# Patient Record
Sex: Female | Born: 1950
Health system: Southern US, Community
[De-identification: ages and names within clinical notes are randomized; demographics above are authoritative.]

## PROBLEM LIST (undated history)

## (undated) DIAGNOSIS — G709 Myoneural disorder, unspecified: Secondary | ICD-10-CM

## (undated) DIAGNOSIS — Z923 Personal history of irradiation: Secondary | ICD-10-CM

## (undated) DIAGNOSIS — F329 Major depressive disorder, single episode, unspecified: Secondary | ICD-10-CM

## (undated) DIAGNOSIS — M1990 Unspecified osteoarthritis, unspecified site: Secondary | ICD-10-CM

## (undated) DIAGNOSIS — I341 Nonrheumatic mitral (valve) prolapse: Secondary | ICD-10-CM

## (undated) DIAGNOSIS — F32A Depression, unspecified: Secondary | ICD-10-CM

## (undated) DIAGNOSIS — N840 Polyp of corpus uteri: Secondary | ICD-10-CM

## (undated) DIAGNOSIS — C569 Malignant neoplasm of unspecified ovary: Secondary | ICD-10-CM

## (undated) DIAGNOSIS — R188 Other ascites: Secondary | ICD-10-CM

## (undated) DIAGNOSIS — M069 Rheumatoid arthritis, unspecified: Secondary | ICD-10-CM

## (undated) DIAGNOSIS — Z87442 Personal history of urinary calculi: Secondary | ICD-10-CM

## (undated) DIAGNOSIS — N200 Calculus of kidney: Secondary | ICD-10-CM

## (undated) DIAGNOSIS — F419 Anxiety disorder, unspecified: Secondary | ICD-10-CM

## (undated) DIAGNOSIS — R600 Localized edema: Secondary | ICD-10-CM

## (undated) DIAGNOSIS — R079 Chest pain, unspecified: Secondary | ICD-10-CM

## (undated) DIAGNOSIS — Z8052 Family history of malignant neoplasm of bladder: Secondary | ICD-10-CM

## (undated) DIAGNOSIS — E7800 Pure hypercholesterolemia, unspecified: Secondary | ICD-10-CM

## (undated) DIAGNOSIS — I82401 Acute embolism and thrombosis of unspecified deep veins of right lower extremity: Secondary | ICD-10-CM

## (undated) DIAGNOSIS — Z8719 Personal history of other diseases of the digestive system: Secondary | ICD-10-CM

## (undated) DIAGNOSIS — B029 Zoster without complications: Secondary | ICD-10-CM

## (undated) DIAGNOSIS — Z807 Family history of other malignant neoplasms of lymphoid, hematopoietic and related tissues: Secondary | ICD-10-CM

## (undated) HISTORY — DX: Depression, unspecified: F32.A

## (undated) HISTORY — DX: Pure hypercholesterolemia, unspecified: E78.00

## (undated) HISTORY — DX: Anxiety disorder, unspecified: F41.9

## (undated) HISTORY — DX: Family history of other malignant neoplasms of lymphoid, hematopoietic and related tissues: Z80.7

## (undated) HISTORY — DX: Zoster without complications: B02.9

## (undated) HISTORY — DX: Unspecified osteoarthritis, unspecified site: M19.90

## (undated) HISTORY — PX: KIDNEY STONE SURGERY: SHX686

## (undated) HISTORY — DX: Nonrheumatic mitral (valve) prolapse: I34.1

## (undated) HISTORY — DX: Major depressive disorder, single episode, unspecified: F32.9

## (undated) HISTORY — DX: Polyp of corpus uteri: N84.0

## (undated) HISTORY — DX: Malignant neoplasm of unspecified ovary: C56.9

## (undated) HISTORY — DX: Calculus of kidney: N20.0

## (undated) HISTORY — DX: Rheumatoid arthritis, unspecified: M06.9

## (undated) HISTORY — DX: Personal history of other diseases of the digestive system: Z87.19

## (undated) HISTORY — DX: Family history of malignant neoplasm of bladder: Z80.52

## (undated) HISTORY — DX: Personal history of irradiation: Z92.3

---

## 1978-10-04 HISTORY — PX: TUBAL LIGATION: SHX77

## 1994-10-04 HISTORY — PX: HAND SURGERY: SHX662

## 1999-05-08 ENCOUNTER — Encounter: Payer: Self-pay | Admitting: Neurosurgery

## 1999-05-08 ENCOUNTER — Ambulatory Visit (HOSPITAL_COMMUNITY): Admission: RE | Admit: 1999-05-08 | Discharge: 1999-05-08 | Payer: Self-pay | Admitting: Neurosurgery

## 1999-05-11 ENCOUNTER — Encounter: Payer: Self-pay | Admitting: Neurosurgery

## 1999-09-14 ENCOUNTER — Ambulatory Visit (HOSPITAL_COMMUNITY): Admission: RE | Admit: 1999-09-14 | Discharge: 1999-09-14 | Payer: Self-pay | Admitting: Orthopedic Surgery

## 1999-09-14 ENCOUNTER — Encounter: Payer: Self-pay | Admitting: Orthopedic Surgery

## 1999-10-23 ENCOUNTER — Encounter: Payer: Self-pay | Admitting: Obstetrics and Gynecology

## 1999-10-23 ENCOUNTER — Encounter: Admission: RE | Admit: 1999-10-23 | Discharge: 1999-10-23 | Payer: Self-pay | Admitting: Obstetrics and Gynecology

## 2000-03-07 ENCOUNTER — Other Ambulatory Visit: Admission: RE | Admit: 2000-03-07 | Discharge: 2000-03-07 | Payer: Self-pay | Admitting: Obstetrics and Gynecology

## 2000-03-07 ENCOUNTER — Encounter (INDEPENDENT_AMBULATORY_CARE_PROVIDER_SITE_OTHER): Payer: Self-pay | Admitting: Specialist

## 2000-07-02 ENCOUNTER — Encounter: Admission: RE | Admit: 2000-07-02 | Discharge: 2000-07-02 | Payer: Self-pay | Admitting: Pulmonary Disease

## 2000-07-02 ENCOUNTER — Encounter: Payer: Self-pay | Admitting: Pulmonary Disease

## 2001-07-10 ENCOUNTER — Ambulatory Visit (HOSPITAL_COMMUNITY): Admission: RE | Admit: 2001-07-10 | Discharge: 2001-07-10 | Payer: Self-pay | Admitting: Pulmonary Disease

## 2001-07-10 ENCOUNTER — Encounter: Payer: Self-pay | Admitting: Pulmonary Disease

## 2001-10-23 ENCOUNTER — Encounter: Payer: Self-pay | Admitting: Obstetrics and Gynecology

## 2001-10-23 ENCOUNTER — Encounter: Admission: RE | Admit: 2001-10-23 | Discharge: 2001-10-23 | Payer: Self-pay | Admitting: Obstetrics and Gynecology

## 2002-10-04 HISTORY — PX: HYSTEROSCOPY: SHX211

## 2002-10-04 HISTORY — PX: DILATION AND CURETTAGE OF UTERUS: SHX78

## 2002-11-22 ENCOUNTER — Encounter: Admission: RE | Admit: 2002-11-22 | Discharge: 2002-11-22 | Payer: Self-pay | Admitting: Obstetrics and Gynecology

## 2002-11-22 ENCOUNTER — Encounter: Payer: Self-pay | Admitting: Obstetrics and Gynecology

## 2003-01-29 ENCOUNTER — Other Ambulatory Visit: Admission: RE | Admit: 2003-01-29 | Discharge: 2003-01-29 | Payer: Self-pay | Admitting: Obstetrics and Gynecology

## 2003-05-09 ENCOUNTER — Ambulatory Visit (HOSPITAL_BASED_OUTPATIENT_CLINIC_OR_DEPARTMENT_OTHER): Admission: RE | Admit: 2003-05-09 | Discharge: 2003-05-09 | Payer: Self-pay | Admitting: Obstetrics and Gynecology

## 2003-05-09 ENCOUNTER — Encounter (INDEPENDENT_AMBULATORY_CARE_PROVIDER_SITE_OTHER): Payer: Self-pay | Admitting: Specialist

## 2003-12-03 ENCOUNTER — Ambulatory Visit (HOSPITAL_COMMUNITY): Admission: RE | Admit: 2003-12-03 | Discharge: 2003-12-03 | Payer: Self-pay | Admitting: Obstetrics and Gynecology

## 2004-02-03 ENCOUNTER — Other Ambulatory Visit: Admission: RE | Admit: 2004-02-03 | Discharge: 2004-02-03 | Payer: Self-pay | Admitting: Obstetrics and Gynecology

## 2004-04-20 HISTORY — PX: COLONOSCOPY: SHX174

## 2004-09-04 ENCOUNTER — Ambulatory Visit: Payer: Self-pay | Admitting: Cardiovascular Disease

## 2004-09-21 ENCOUNTER — Ambulatory Visit: Payer: Self-pay

## 2004-09-24 ENCOUNTER — Ambulatory Visit: Payer: Self-pay | Admitting: Pulmonary Disease

## 2004-10-20 ENCOUNTER — Ambulatory Visit: Payer: Self-pay | Admitting: Pulmonary Disease

## 2004-12-14 ENCOUNTER — Ambulatory Visit: Payer: Self-pay | Admitting: Pulmonary Disease

## 2005-02-05 ENCOUNTER — Other Ambulatory Visit: Admission: RE | Admit: 2005-02-05 | Discharge: 2005-02-05 | Payer: Self-pay | Admitting: Addiction Medicine

## 2005-02-10 ENCOUNTER — Ambulatory Visit (HOSPITAL_COMMUNITY): Admission: RE | Admit: 2005-02-10 | Discharge: 2005-02-10 | Payer: Self-pay | Admitting: Obstetrics and Gynecology

## 2005-02-24 ENCOUNTER — Ambulatory Visit: Payer: Self-pay | Admitting: Pulmonary Disease

## 2005-06-22 ENCOUNTER — Ambulatory Visit: Payer: Self-pay | Admitting: Pulmonary Disease

## 2005-06-24 ENCOUNTER — Ambulatory Visit (HOSPITAL_COMMUNITY): Admission: RE | Admit: 2005-06-24 | Discharge: 2005-06-24 | Payer: Self-pay | Admitting: Pulmonary Disease

## 2005-07-19 ENCOUNTER — Inpatient Hospital Stay (HOSPITAL_COMMUNITY): Admission: RE | Admit: 2005-07-19 | Discharge: 2005-07-20 | Payer: Self-pay | Admitting: Neurosurgery

## 2005-08-23 ENCOUNTER — Ambulatory Visit: Payer: Self-pay | Admitting: Pulmonary Disease

## 2006-02-07 ENCOUNTER — Other Ambulatory Visit: Admission: RE | Admit: 2006-02-07 | Discharge: 2006-02-07 | Payer: Self-pay | Admitting: Obstetrics and Gynecology

## 2006-02-17 ENCOUNTER — Ambulatory Visit (HOSPITAL_COMMUNITY): Admission: RE | Admit: 2006-02-17 | Discharge: 2006-02-17 | Payer: Self-pay | Admitting: Obstetrics and Gynecology

## 2006-02-25 ENCOUNTER — Ambulatory Visit: Payer: Self-pay | Admitting: Pulmonary Disease

## 2006-03-07 ENCOUNTER — Ambulatory Visit: Payer: Self-pay | Admitting: Cardiology

## 2006-04-07 ENCOUNTER — Ambulatory Visit: Payer: Self-pay | Admitting: Internal Medicine

## 2006-04-14 ENCOUNTER — Ambulatory Visit: Payer: Self-pay | Admitting: Cardiology

## 2006-04-25 ENCOUNTER — Ambulatory Visit (HOSPITAL_COMMUNITY): Admission: RE | Admit: 2006-04-25 | Discharge: 2006-04-25 | Payer: Self-pay | Admitting: Neurosurgery

## 2006-08-15 ENCOUNTER — Ambulatory Visit: Payer: Self-pay | Admitting: Cardiovascular Disease

## 2006-08-22 ENCOUNTER — Ambulatory Visit: Payer: Self-pay

## 2006-08-31 ENCOUNTER — Ambulatory Visit: Payer: Self-pay | Admitting: Pulmonary Disease

## 2006-08-31 LAB — CONVERTED CEMR LAB
Bilirubin Urine: NEGATIVE
Crystals: NEGATIVE
Urine Glucose: NEGATIVE mg/dL
pH: 6 (ref 5.0–8.0)

## 2006-10-04 HISTORY — PX: BACK SURGERY: SHX140

## 2006-11-17 ENCOUNTER — Ambulatory Visit: Payer: Self-pay | Admitting: Internal Medicine

## 2006-12-26 ENCOUNTER — Ambulatory Visit: Payer: Self-pay | Admitting: Pulmonary Disease

## 2007-01-03 ENCOUNTER — Ambulatory Visit: Payer: Self-pay

## 2007-01-03 LAB — CONVERTED CEMR LAB
ALT: 35 units/L (ref 0–40)
AST: 23 units/L (ref 0–37)
Alkaline Phosphatase: 41 units/L (ref 39–117)
HDL: 51.6 mg/dL (ref >39.0)
Total Bilirubin: 1 mg/dL (ref 0.3–1.2)
VLDL: 17 mg/dL (ref 0–40)

## 2007-01-05 ENCOUNTER — Ambulatory Visit: Payer: Self-pay | Admitting: Cardiology

## 2007-01-19 ENCOUNTER — Ambulatory Visit: Payer: Self-pay | Admitting: Pulmonary Disease

## 2007-01-19 LAB — CONVERTED CEMR LAB
ALT: 34 units/L (ref 0–40)
Albumin: 3.9 g/dL (ref 3.5–5.2)
Alkaline Phosphatase: 51 units/L (ref 39–117)
BUN: 17 mg/dL (ref 6–23)
Basophils Relative: 0.8 % (ref 0.0–1.0)
Bilirubin Urine: NEGATIVE
CO2: 31 meq/L (ref 19–32)
Creatinine, Ser: 0.8 mg/dL (ref 0.4–1.2)
Eosinophils Absolute: 0.1 10*3/uL (ref 0.0–0.6)
Eosinophils Relative: 0.8 % (ref 0.0–5.0)
Glucose, Bld: 88 mg/dL (ref 70–99)
Lymphocytes Relative: 17.5 % (ref 12.0–46.0)
Monocytes Absolute: 0.5 10*3/uL (ref 0.2–0.7)
Neutro Abs: 5.4 10*3/uL (ref 1.4–7.7)
Neutrophils Relative %: 73.5 % (ref 43.0–77.0)
Nitrite: NEGATIVE
Platelets: 248 10*3/uL (ref 150–400)
RBC: 4.26 M/uL (ref 3.87–5.11)
RDW: 12.3 % (ref 11.5–14.6)
Sodium: 140 meq/L (ref 135–145)
TSH: 0.9 microintl units/mL (ref 0.35–5.50)
Urine Glucose: NEGATIVE mg/dL
WBC: 7.4 10*3/uL (ref 4.5–10.5)

## 2007-02-21 ENCOUNTER — Other Ambulatory Visit: Admission: RE | Admit: 2007-02-21 | Discharge: 2007-02-21 | Payer: Self-pay | Admitting: Obstetrics and Gynecology

## 2007-03-01 ENCOUNTER — Ambulatory Visit: Payer: Self-pay | Admitting: Pulmonary Disease

## 2007-03-08 ENCOUNTER — Ambulatory Visit (HOSPITAL_COMMUNITY): Admission: RE | Admit: 2007-03-08 | Discharge: 2007-03-08 | Payer: Self-pay | Admitting: Pulmonary Disease

## 2007-03-15 ENCOUNTER — Ambulatory Visit: Payer: Self-pay | Admitting: Pulmonary Disease

## 2007-03-15 LAB — CONVERTED CEMR LAB
Fecal Occult Blood: NEGATIVE
OCCULT 2: NEGATIVE
OCCULT 4: NEGATIVE
OCCULT 5: NEGATIVE

## 2007-03-21 ENCOUNTER — Ambulatory Visit (HOSPITAL_COMMUNITY): Admission: RE | Admit: 2007-03-21 | Discharge: 2007-03-21 | Payer: Self-pay | Admitting: Obstetrics and Gynecology

## 2007-06-02 ENCOUNTER — Ambulatory Visit: Payer: Self-pay | Admitting: Internal Medicine

## 2007-06-02 LAB — CONVERTED CEMR LAB
AST: 22 units/L (ref 0–37)
Albumin: 3.6 g/dL (ref 3.5–5.2)
Alkaline Phosphatase: 40 units/L (ref 39–117)
Cholesterol: 224 mg/dL (ref 0–200)
HDL: 53.5 mg/dL (ref >39.0)
Total CHOL/HDL Ratio: 4.2

## 2007-06-08 ENCOUNTER — Ambulatory Visit: Payer: Self-pay | Admitting: Cardiology

## 2007-10-05 HISTORY — PX: NECK SURGERY: SHX720

## 2007-10-18 ENCOUNTER — Encounter: Admission: RE | Admit: 2007-10-18 | Discharge: 2007-10-18 | Payer: Self-pay | Admitting: Neurosurgery

## 2007-10-26 ENCOUNTER — Ambulatory Visit: Payer: Self-pay | Admitting: Cardiology

## 2008-02-13 ENCOUNTER — Encounter: Payer: Self-pay | Admitting: Pulmonary Disease

## 2008-03-12 ENCOUNTER — Encounter: Payer: Self-pay | Admitting: Pulmonary Disease

## 2008-04-16 ENCOUNTER — Encounter: Payer: Self-pay | Admitting: Pulmonary Disease

## 2008-05-02 ENCOUNTER — Encounter: Admission: RE | Admit: 2008-05-02 | Discharge: 2008-05-02 | Payer: Self-pay | Admitting: Neurosurgery

## 2008-05-15 DIAGNOSIS — Z8679 Personal history of other diseases of the circulatory system: Secondary | ICD-10-CM | POA: Insufficient documentation

## 2008-05-15 DIAGNOSIS — Z8719 Personal history of other diseases of the digestive system: Secondary | ICD-10-CM

## 2008-05-15 DIAGNOSIS — F32A Depression, unspecified: Secondary | ICD-10-CM | POA: Insufficient documentation

## 2008-05-15 DIAGNOSIS — E7800 Pure hypercholesterolemia, unspecified: Secondary | ICD-10-CM | POA: Insufficient documentation

## 2008-05-15 DIAGNOSIS — Z9189 Other specified personal risk factors, not elsewhere classified: Secondary | ICD-10-CM | POA: Insufficient documentation

## 2008-05-15 DIAGNOSIS — F329 Major depressive disorder, single episode, unspecified: Secondary | ICD-10-CM

## 2008-05-15 DIAGNOSIS — F419 Anxiety disorder, unspecified: Secondary | ICD-10-CM | POA: Insufficient documentation

## 2008-05-15 DIAGNOSIS — M1990 Unspecified osteoarthritis, unspecified site: Secondary | ICD-10-CM | POA: Insufficient documentation

## 2008-05-15 HISTORY — DX: Personal history of other diseases of the digestive system: Z87.19

## 2008-05-16 ENCOUNTER — Ambulatory Visit: Payer: Self-pay | Admitting: Pulmonary Disease

## 2008-05-16 DIAGNOSIS — M549 Dorsalgia, unspecified: Secondary | ICD-10-CM | POA: Insufficient documentation

## 2008-05-16 DIAGNOSIS — M542 Cervicalgia: Secondary | ICD-10-CM | POA: Insufficient documentation

## 2008-05-19 LAB — CONVERTED CEMR LAB: Vit D, 1,25-Dihydroxy: 14 — ABNORMAL LOW (ref 30–89)

## 2008-05-20 ENCOUNTER — Encounter: Payer: Self-pay | Admitting: Pulmonary Disease

## 2008-05-28 ENCOUNTER — Ambulatory Visit (HOSPITAL_COMMUNITY): Admission: RE | Admit: 2008-05-28 | Discharge: 2008-05-28 | Payer: Self-pay | Admitting: Neurosurgery

## 2008-06-21 ENCOUNTER — Other Ambulatory Visit: Admission: RE | Admit: 2008-06-21 | Discharge: 2008-06-21 | Payer: Self-pay | Admitting: Obstetrics and Gynecology

## 2008-06-21 ENCOUNTER — Encounter: Payer: Self-pay | Admitting: Obstetrics and Gynecology

## 2008-06-21 ENCOUNTER — Ambulatory Visit: Payer: Self-pay | Admitting: Obstetrics and Gynecology

## 2008-06-26 ENCOUNTER — Ambulatory Visit (HOSPITAL_COMMUNITY): Admission: RE | Admit: 2008-06-26 | Discharge: 2008-06-26 | Payer: Self-pay | Admitting: Obstetrics and Gynecology

## 2008-07-01 ENCOUNTER — Encounter: Payer: Self-pay | Admitting: Pulmonary Disease

## 2008-07-04 ENCOUNTER — Ambulatory Visit: Payer: Self-pay | Admitting: Obstetrics and Gynecology

## 2008-07-25 ENCOUNTER — Encounter: Admission: RE | Admit: 2008-07-25 | Discharge: 2008-07-25 | Payer: Self-pay | Admitting: *Deleted

## 2008-09-19 ENCOUNTER — Encounter: Payer: Self-pay | Admitting: Pulmonary Disease

## 2008-09-24 ENCOUNTER — Ambulatory Visit: Payer: Self-pay | Admitting: Pulmonary Disease

## 2008-09-25 DIAGNOSIS — E559 Vitamin D deficiency, unspecified: Secondary | ICD-10-CM | POA: Insufficient documentation

## 2008-09-30 ENCOUNTER — Encounter: Payer: Self-pay | Admitting: Pulmonary Disease

## 2008-10-08 ENCOUNTER — Encounter: Payer: Self-pay | Admitting: Pulmonary Disease

## 2008-10-14 ENCOUNTER — Encounter: Admission: RE | Admit: 2008-10-14 | Discharge: 2009-01-12 | Payer: Self-pay | Admitting: Neurosurgery

## 2009-01-17 LAB — CONVERTED CEMR LAB
Nitrite: NEGATIVE
RBC / HPF: NONE SEEN

## 2009-06-13 ENCOUNTER — Ambulatory Visit: Payer: Self-pay | Admitting: Pulmonary Disease

## 2009-06-17 LAB — CONVERTED CEMR LAB: Vit D, 25-Hydroxy: 35 ng/mL (ref 30–89)

## 2009-06-23 ENCOUNTER — Other Ambulatory Visit: Admission: RE | Admit: 2009-06-23 | Discharge: 2009-06-23 | Payer: Self-pay | Admitting: Obstetrics and Gynecology

## 2009-06-23 ENCOUNTER — Ambulatory Visit: Payer: Self-pay | Admitting: Obstetrics and Gynecology

## 2009-06-23 ENCOUNTER — Encounter: Payer: Self-pay | Admitting: Obstetrics and Gynecology

## 2009-06-26 ENCOUNTER — Ambulatory Visit: Payer: Self-pay | Admitting: Obstetrics and Gynecology

## 2009-07-01 ENCOUNTER — Ambulatory Visit (HOSPITAL_COMMUNITY): Admission: RE | Admit: 2009-07-01 | Discharge: 2009-07-01 | Payer: Self-pay | Admitting: Obstetrics and Gynecology

## 2009-07-02 ENCOUNTER — Ambulatory Visit: Payer: Self-pay | Admitting: Obstetrics and Gynecology

## 2009-07-21 ENCOUNTER — Ambulatory Visit: Payer: Self-pay | Admitting: Obstetrics and Gynecology

## 2009-10-04 HISTORY — PX: OTHER SURGICAL HISTORY: SHX169

## 2009-10-07 ENCOUNTER — Telehealth (INDEPENDENT_AMBULATORY_CARE_PROVIDER_SITE_OTHER): Payer: Self-pay | Admitting: *Deleted

## 2009-11-10 ENCOUNTER — Ambulatory Visit: Payer: Self-pay | Admitting: Obstetrics and Gynecology

## 2009-11-21 ENCOUNTER — Ambulatory Visit: Payer: Self-pay | Admitting: Obstetrics and Gynecology

## 2009-12-12 ENCOUNTER — Ambulatory Visit: Payer: Self-pay | Admitting: Obstetrics and Gynecology

## 2010-02-23 ENCOUNTER — Telehealth: Payer: Self-pay | Admitting: Pulmonary Disease

## 2010-02-25 ENCOUNTER — Ambulatory Visit: Payer: Self-pay | Admitting: Pulmonary Disease

## 2010-03-05 ENCOUNTER — Telehealth (INDEPENDENT_AMBULATORY_CARE_PROVIDER_SITE_OTHER): Payer: Self-pay | Admitting: *Deleted

## 2010-03-06 LAB — CONVERTED CEMR LAB
Bilirubin, Direct: 0.1 mg/dL (ref 0.0–0.3)
Direct LDL: 123.6 mg/dL
HDL: 55.3 mg/dL (ref >39.00)
Total CHOL/HDL Ratio: 4
Total Protein: 7 g/dL (ref 6.0–8.3)
VLDL: 30.4 mg/dL (ref 0.0–40.0)

## 2010-03-30 ENCOUNTER — Telehealth (INDEPENDENT_AMBULATORY_CARE_PROVIDER_SITE_OTHER): Payer: Self-pay | Admitting: *Deleted

## 2010-04-02 ENCOUNTER — Telehealth (INDEPENDENT_AMBULATORY_CARE_PROVIDER_SITE_OTHER): Payer: Self-pay | Admitting: *Deleted

## 2010-04-07 ENCOUNTER — Encounter: Payer: Self-pay | Admitting: Pulmonary Disease

## 2010-04-16 ENCOUNTER — Ambulatory Visit: Payer: Self-pay | Admitting: Pulmonary Disease

## 2010-04-16 DIAGNOSIS — L723 Sebaceous cyst: Secondary | ICD-10-CM | POA: Insufficient documentation

## 2010-06-23 ENCOUNTER — Encounter: Payer: Self-pay | Admitting: Pulmonary Disease

## 2010-06-23 ENCOUNTER — Ambulatory Visit: Payer: Self-pay | Admitting: Pulmonary Disease

## 2010-06-24 ENCOUNTER — Other Ambulatory Visit: Admission: RE | Admit: 2010-06-24 | Discharge: 2010-06-24 | Payer: Self-pay | Admitting: Obstetrics and Gynecology

## 2010-06-24 ENCOUNTER — Ambulatory Visit: Payer: Self-pay | Admitting: Obstetrics and Gynecology

## 2010-06-27 LAB — CONVERTED CEMR LAB: Vit D, 25-Hydroxy: 39 ng/mL (ref 30–89)

## 2010-06-29 LAB — CONVERTED CEMR LAB
ALT: 28 units/L (ref 0–35)
AST: 22 units/L (ref 0–37)
Albumin: 4.1 g/dL (ref 3.5–5.2)
BUN: 23 mg/dL (ref 6–23)
Basophils Relative: 0.4 % (ref 0.0–3.0)
CO2: 30 meq/L (ref 19–32)
Chloride: 102 meq/L (ref 96–112)
Cholesterol: 334 mg/dL — ABNORMAL HIGH (ref 0–200)
Creatinine, Ser: 0.7 mg/dL (ref 0.4–1.2)
Eosinophils Relative: 0.6 % (ref 0.0–5.0)
Glucose, Bld: 82 mg/dL (ref 70–99)
HDL: 60 mg/dL (ref >39.00)
Lymphocytes Relative: 18 % (ref 12.0–46.0)
Monocytes Absolute: 0.6 10*3/uL (ref 0.1–1.0)
Neutrophils Relative %: 71.5 % (ref 43.0–77.0)
Platelets: 259 10*3/uL (ref 150.0–400.0)
Potassium: 4 meq/L (ref 3.5–5.1)
RBC: 4.33 M/uL (ref 3.87–5.11)
TSH: 1.77 microintl units/mL (ref 0.35–5.50)
Total CHOL/HDL Ratio: 6
Total Protein: 6.7 g/dL (ref 6.0–8.3)
Triglycerides: 170 mg/dL — ABNORMAL HIGH (ref 0.0–149.0)
WBC: 6.3 10*3/uL (ref 4.5–10.5)

## 2010-07-02 ENCOUNTER — Encounter: Admission: RE | Admit: 2010-07-02 | Discharge: 2010-07-02 | Payer: Self-pay | Admitting: Obstetrics and Gynecology

## 2010-07-06 ENCOUNTER — Ambulatory Visit: Payer: Self-pay | Admitting: Internal Medicine

## 2010-07-06 LAB — CONVERTED CEMR LAB
HDL goal, serum: 40 mg/dL
LDL Goal: 160 mg/dL

## 2010-07-07 ENCOUNTER — Ambulatory Visit: Payer: Self-pay | Admitting: Obstetrics and Gynecology

## 2010-07-21 ENCOUNTER — Ambulatory Visit: Payer: Self-pay | Admitting: Obstetrics and Gynecology

## 2010-08-24 ENCOUNTER — Ambulatory Visit: Payer: Self-pay | Admitting: Cardiovascular Disease

## 2010-08-26 LAB — CONVERTED CEMR LAB
ALT: 24 units/L (ref 0–35)
Bilirubin, Direct: 0.1 mg/dL (ref 0.0–0.3)
Direct LDL: 129.4 mg/dL
HDL: 53.5 mg/dL (ref >39.00)
Total Protein: 6.3 g/dL (ref 6.0–8.3)
Triglycerides: 111 mg/dL (ref 0.0–149.0)
VLDL: 22.2 mg/dL (ref 0.0–40.0)

## 2010-09-03 ENCOUNTER — Ambulatory Visit: Payer: Self-pay | Admitting: Cardiology

## 2010-09-08 ENCOUNTER — Telehealth: Payer: Self-pay | Admitting: Cardiology

## 2010-11-01 LAB — CONVERTED CEMR LAB
ALT: 37 units/L — ABNORMAL HIGH (ref 0–35)
AST: 24 units/L (ref 0–37)
AST: 26 units/L (ref 0–37)
Albumin: 4.2 g/dL (ref 3.5–5.2)
Albumin: 4.3 g/dL (ref 3.5–5.2)
Alkaline Phosphatase: 54 units/L (ref 39–117)
BUN: 20 mg/dL (ref 6–23)
Basophils Absolute: 0 10*3/uL (ref 0.0–0.1)
Basophils Absolute: 0.1 10*3/uL (ref 0.0–0.1)
Basophils Relative: 0.2 % (ref 0.0–3.0)
Basophils Relative: 1.6 % (ref 0.0–3.0)
Bilirubin Urine: NEGATIVE
Bilirubin, Direct: 0.2 mg/dL (ref 0.0–0.3)
Calcium: 9.3 mg/dL (ref 8.4–10.5)
Chloride: 103 meq/L (ref 96–112)
Cholesterol: 195 mg/dL (ref 0–200)
Creatinine, Ser: 0.7 mg/dL (ref 0.4–1.2)
Creatinine, Ser: 0.8 mg/dL (ref 0.4–1.2)
Crystals: NEGATIVE
Eosinophils Absolute: 0 10*3/uL (ref 0.0–0.7)
Eosinophils Absolute: 0 10*3/uL (ref 0.0–0.7)
Eosinophils Relative: 0.6 % (ref 0.0–5.0)
GFR calc non Af Amer: 77.88 mL/min (ref >60)
GFR calc non Af Amer: 91 mL/min
Glucose, Bld: 88 mg/dL (ref 70–99)
HCT: 44.3 % (ref 36.0–46.0)
HDL: 64.9 mg/dL (ref >39.0)
Hemoglobin, Urine: NEGATIVE
Hemoglobin: 15.1 g/dL — ABNORMAL HIGH (ref 12.0–15.0)
Ketones, ur: NEGATIVE mg/dL
LDL Cholesterol: 110 mg/dL — ABNORMAL HIGH (ref 0–99)
Lymphocytes Relative: 17.1 % (ref 12.0–46.0)
Lymphocytes Relative: 17.2 % (ref 12.0–46.0)
MCHC: 34.1 g/dL (ref 30.0–36.0)
MCHC: 34.6 g/dL (ref 30.0–36.0)
MCV: 97.3 fL (ref 78.0–100.0)
Monocytes Absolute: 0.6 10*3/uL (ref 0.1–1.0)
Mucus, UA: NEGATIVE
Neutro Abs: 5.1 10*3/uL (ref 1.4–7.7)
Neutro Abs: 5.5 10*3/uL (ref 1.4–7.7)
Neutrophils Relative %: 73.6 % (ref 43.0–77.0)
Nitrite: NEGATIVE
Nitrite: NEGATIVE
RBC / HPF: NONE SEEN
RBC: 4.5 M/uL (ref 3.87–5.11)
Sed Rate: 22 mm/hr (ref 0–22)
Sodium: 141 meq/L (ref 135–145)
Specific Gravity, Urine: 1.025 (ref 1.000–1.030)
Total Bilirubin: 1.3 mg/dL — ABNORMAL HIGH (ref 0.3–1.2)
Total Bilirubin: 1.5 mg/dL — ABNORMAL HIGH (ref 0.3–1.2)
Total CHOL/HDL Ratio: 3
Total Protein, Urine: NEGATIVE mg/dL
Total Protein: 7.2 g/dL (ref 6.0–8.3)
Total Protein: 7.3 g/dL (ref 6.0–8.3)
Triglycerides: 104 mg/dL (ref 0.0–149.0)
Urine Glucose: NEGATIVE mg/dL
VLDL: 20 mg/dL (ref 0–40)
WBC: 6.9 10*3/uL (ref 4.5–10.5)

## 2010-11-03 NOTE — Progress Notes (Signed)
Summary: results  Phone Note Call from Patient Call back at Home Phone [phone]   Caller: Patient Call For: nadel Reason for Call: Talk to Nurse, Lab or Test Results Summary of Call: looking for results of bloodwork. Initial call taken by: Eugene Gavia,  March 05, 2010 4:30 PM  Follow-up for Phone Call        Please advise results. Thanks Vernie Murders  March 05, 2010 4:31 PM   per SN---pt is currently taking lipitor 40mg   1/2 tab daily---rec increasing the lipitor to 40mg  daily----labs showed chol--208--last time it was 198  tg at 152  ldl is 124  last time was 831...hdl 56--needs to be below 39.  lmomtcb for pt Randell Loop CMA  March 06, 2010 12:38 PM    Additional Follow-up for Phone Call Additional follow up Details #1::        pt called me back and she is aware of lab results and to increase the lipitor to 1 tab daily per SN recs.  she will start this new dosage and we will follow up.  pt voiced her understanding Randell Loop CMA  March 09, 2010 12:36 PM     New/Updated Medications: LIPITOR 40 MG TABS (ATORVASTATIN CALCIUM) Take 1 tablet by mouth once a day

## 2010-11-03 NOTE — Assessment & Plan Note (Signed)
Summary: Acute NP office visit - cyst on neck   CC:  cyst on right side of neck under chin x7-31months that grew larger and developed soreness  x84months.  History of Present Illness: 60  y/o WF here for a follow up visit... she has multiple medical problems as noted below...    ~  September 24, 2008:  presents c/o her arthritis/ DJD... she has seen DrKuzma for severe DJD in hands, and he has rec mult joint replacements + hot soaks + Mobic 15mg /d... she is reluctant to committ to this extensive surg & wants a second opinion from Rheum- refer to [facility] et al for their review... she also sees DrPool for Neurosurg- she has had ant neck surg (8/09= ant cerv discectomy & fusion at C5-6 for stenosis w/ myelopathy) and prev lumbar lam (10/06= right L4-5 laminotomy & microdiscectomy)... he has her on Vicodin Prn & she is getting PT... ?recently had another myelogram and MRI?[name].. she worked 55yrs at [employer] now w/ [employer] in [place] but she can't do the work and wonders about disability...   ~  June 13, 2009:  she has had severe arthritis symptoms in her hands and saw DrAnderson for Rheum- Rx Naprosyn 500Bid + Tramadol 50Bid...  she states that he is recommending disability due to her severe OA... also c/o "muscle spasms- all over my body" & we discussed trial Robaxin for this...  April 16, 2010 --Presents for work in visit. Complains of cyst on right side of neck under chin x7-36months It has grown larger and developed soreness. x21months. No redness or fever. No weight loss, dysphagia, chest pain or dyspnea. Previously seen by Dr. Yetta Barre. Denies chest pain, dyspnea, orthopnea, hemoptysis, fever, n/v/d, edema, headache.       Medications Prior to Update: 1)  Lipitor 40 Mg Tabs (Atorvastatin Calcium) .... Take 1 Tablet By Mouth Once A Day 2)  Caltrate 600+d Plus 600-400 Mg-Unit Tabs (Calcium Carbonate-Vit D-Min) .... Take 1 Tab Daily.Marland KitchenMarland Kitchen 3)  Vitamin D3 5000 Unit/ml Liqd (Cholecalciferol) .... Take 1  Cap Daily.Marland KitchenMarland Kitchen 4)  Cipro 250 Mg Tabs (Ciprofloxacin Hcl) .... Take One Tablet By Mouth Two Times A Day  Current Medications (verified): 1)  Caltrate 600+d Plus 600-400 Mg-Unit Tabs (Calcium Carbonate-Vit D-Min) .... Take 1 Tab Daily.Marland KitchenMarland Kitchen 2)  Vitamin D3 5000 Unit/ml Liqd (Cholecalciferol) .... Take 1 Cap Daily.Marland KitchenMarland Kitchen 3)  Centrum Silver  Tabs (Multiple Vitamins-Minerals) .... Take 1 Tablet By Mouth Once A Day 4)  Fish Oil 1000 Mg Caps (Omega-3 Fatty Acids) .... Take 1 Capsule By Mouth Once A Day  Allergies (verified): 1)  ! * Decongestants  Past History:  Past Medical History: Last updated: 06/13/2009 CHEST WALL PAIN, HX OF (ICD-V15.89) MITRAL VALVE PROLAPSE, HX OF (ICD-V12.50) HYPERCHOLESTEROLEMIA (ICD-272.0) IRRITABLE BOWEL SYNDROME, HX OF (ICD-V12.79) DEGENERATIVE JOINT DISEASE (ICD-715.90) NECK PAIN (ICD-723.1) BACK PAIN (ICD-724.5) VITAMIN D DEFICIENCY (ICD-268.9) ANXIETY DISORDER (ICD-300.00)  Past Surgical History: Last updated: 06/13/2009 S/P D & C by DrGottsegen in 2004 S/P L4-5 right lumbar laminotomy & microdiscectomy 2006 by DrPool S/P C6-7 ant cerv discectomy & fusion 8/09 by DrPool  Family History: Last updated: 06-05-08 mother deceased from heart problems father alive age 28 1 sibling alive age 29 1 sibling alive age 96  Social History: Last updated: 06/05/08 non smoker exposed to second hand smoke 1 cup daily/caffeine married 2 children  Risk Factors: Smoking Status: never (09/24/2008)  Review of Systems      See HPI  Vital Signs:  Patient profile:  60 year old female Height:      64 inches Weight:      155.50 pounds BMI:     26.79 O2 Sat:      95 % on Room air Temp:     99.1 degrees F oral Pulse rate:   79 / minute BP sitting:   124 / 78  (left arm) Cuff size:   regular  Vitals Entered By: Boone Master CNA/MA (April 16, 2010 10:02 AM)  O2 Flow:  Room air CC: cyst on right side of neck under chin x7-62months that grew larger and developed  soreness  x85months Is Patient Diabetic? No Comments Medications reviewed with patient Daytime contact number verified with patient. Boone Master CNA/MA  April 16, 2010 10:03 AM    Physical Exam  Additional Exam:  WD, WN, 60  y/o WF in NAD... GENERAL:  Alert & oriented; pleasant & cooperative... HEENT:  [place]/AT,, EACs-clear, TMs-wnl, NOSE-clear, THROAT-clear & wnl. NECK:  Supple w/ decrROM; scar of ant cerv discectomy; no JVD; normal carotid impulses w/o bruits;  no thyromegaly or nodules palpated; no lymphadenopathy. small soft cyst along right mid anterior neck  ~1cm  CHEST:  Clear to P & A; without wheezes/ rales/ or rhonchi. HEART:  Regular Rhythm; without murmurs/ rubs/ or gallops. DERM:  small soft cyst along right mid anterior neck  ~1cm , no redness , tender to touch.    Impression & Recommendations:  Problem # 1:  SEBACEOUS CYST, NECK (ICD-706.2)  Suspected sebaceous neck cyst.  REC:  Warm compresses to cyst on neck as needed  We are referring you to dermatology to evaluate this follow up Dr. Kriste Basque in 2 months for physical.  Please contact office for sooner follow up if symptoms do not improve or worsen   Orders: Est. Patient Level III (69629)  Medications Added to Medication List This Visit: 1)  Centrum Silver Tabs (Multiple vitamins-minerals) .... Take 1 tablet by mouth once a day 2)  Fish Oil 1000 Mg Caps (Omega-3 fatty acids) .... Take 1 capsule by mouth once a day  Complete Medication List: 1)  Caltrate 600+d Plus 600-400 Mg-unit Tabs (Calcium carbonate-vit d-min) .... Take 1 tab daily.Marland KitchenMarland Kitchen 2)  Vitamin D3 5000 Unit/ml Liqd (Cholecalciferol) .... Take 1 cap daily.Marland KitchenMarland Kitchen 3)  Centrum Silver Tabs (Multiple vitamins-minerals) .... Take 1 tablet by mouth once a day 4)  Fish Oil 1000 Mg Caps (Omega-3 fatty acids) .... Take 1 capsule by mouth once a day  Other Orders: Dermatology Referral (Derma)  Patient Instructions: 1)  Warm compresses to cyst on neck as needed  2)  We  are referring you to dermatology to evaluate this 3)  follow up Dr. Kriste Basque in 2 months for physical.  4)  Please contact office for sooner follow up if symptoms do not improve or worsen    Immunization History:  Influenza Immunization History:    Influenza:  historical (08/04/2009)

## 2010-11-03 NOTE — Progress Notes (Signed)
Summary: needs refill    Phone Note Refill Request Message from:  Patient on September 08, 2010 11:06 AM  Refills Requested: Medication #1:  PRAVASTATIN SODIUM 20 MG TABS Take one tablet by mouth daily at bedtime. Erick Alley [phone]  Initial call taken by: Judie Grieve,  September 08, 2010 11:07 AM  Follow-up for Phone Call        RX done.  Follow-up by: Bethena Midget, RN, BSN,  September 08, 2010 3:40 PM    Prescriptions: PRAVASTATIN SODIUM 20 MG TABS (PRAVASTATIN SODIUM) Take one tablet by mouth daily at bedtime  #30 x 12   Entered by:   Bethena Midget, RN, BSN   Authorized by:   Gaylord Shih, MD, [facility]   Signed by:   Bethena Midget, RN, BSN on 09/08/2010   Method used:   Electronically to        [facility] (retail)       [address]       [place], [place]  [address]       Ph: [phone]       Fax: [phone]   RxID:   [id number]   Appended Document: needs refill  Spoke with pt and she is aware that RX is done.

## 2010-11-03 NOTE — Letter (Signed)
Summary: [facility]   Imported By: Sherian Rein 05/06/2010 07:48:33  _____________________________________________________________________  External Attachment:    Type:   Image     Comment:   External Document

## 2010-11-03 NOTE — Progress Notes (Signed)
Summary: bloodwork  Phone Note Call from Patient Call back at Home Phone [phone]   Caller: Patient Call For: Marili Vader Reason for Call: Talk to Nurse Summary of Call: pt said she is on Lipitor.  Needs to do her 6 month blood work.  Does she need to see SN when she has bloodwork? Initial call taken by: Eugene Gavia,  Feb 23, 2010 3:30 PM  Follow-up for Phone Call        Pt has CPX in September and was advised to have cholesterol checked in 6 months, she has not done so yet. Pt wanted to know does she also need an appt of just come in for labs. Please advise on what labs to place. Thanks. Carron Curie CMA  Feb 23, 2010 3:35 PM   Additional Follow-up for Phone Call Additional follow up Details #1::        called and spoke with pt and she is aware per SN---only labs needed---she will keep her appt in sept for cpx. Randell Loop CMA  Feb 23, 2010 5:31 PM

## 2010-11-03 NOTE — Progress Notes (Signed)
Summary: [facility]  Request for records received from [facility]. Request forwarded to [facility]. Dena Chavis  October 07, 2009 3:32 PM

## 2010-11-03 NOTE — Assessment & Plan Note (Signed)
Summary: follow-up lipid/ewj    [name] [name] presents to lipid clinic for a follow up visit. Previous statin intolerances included Lipitor, which caused muscle aches. Current regimen includes pravastatin 20 mg and fish oil 1000 mg daily. No reported side effects at this time. She does have occasional muscle cramps at night but states she has a bad back, which she thinks is the source of her cramps instead of the pravastatin. Exercise has improved since last visit. She walks on the treadmill 10 minutes in the morning and 10 minutes in the afternoon, usually every other day. She says she has really been working hard and was extremely pleased with her cholesterol levels and said it has given more motivation to workout. As for diet, she typically eats cheerios for breakfast, pasta for lunch, and cooks in the evening for her husband. Some favorites for  the dinner meal are fried chicken, baby back ribs, and cubed steaks.  Lipid Management Provider  Lyna Poser, PharmD  Current Medications (verified): 1)  Fish Oil 1000 Mg Caps (Omega-3 Fatty Acids) .... Take 1 Capsule By Mouth Once A Day 2)  Caltrate 600+d Plus 600-400 Mg-Unit Tabs (Calcium Carbonate-Vit D-Min) .... Take 1 Tab Daily.Marland KitchenMarland Kitchen 3)  Centrum Silver  Tabs (Multiple Vitamins-Minerals) .... Take 1 Tablet By Mouth Once A Day 4)  Vitamin D3 5000 Unit/ml Liqd (Cholecalciferol) .... Take 1 Cap Daily.Marland KitchenMarland Kitchen 5)  Pain Pill Per Dranderson... .... As Directed. 6)  Pravastatin Sodium 20 Mg Tabs (Pravastatin Sodium) .... Take One Tablet By Mouth Daily At Bedtime  Allergies: 1)  ! * Decongestants 2)  ! Lipitor  Family History: Reviewed history from 07/06/2010 and no changes required. mother deceased from heart problems (age 61) father alive age 37 1 sibling alive age 33 1 sibling alive age 64  Social History: Reviewed history from 06/23/2010 and no changes required. non smoker exposed to second hand smoke 1 cup daily/caffeine married 2  children   Vital Signs:  Patient profile:   60 year old female Weight:      149 pounds Pulse rate:   74 / minute BP sitting:   118 / 83  (left arm)  Impression & Recommendations:  Problem # 1:  HYPERCHOLESTEROLEMIA (ICD-272.0) TC 212, TG 111 (goal <150), LDL 129 (goal <130), HDL 53. Levels have significantly improved since last visit. Patient has lot 5 pounds by exercising on treadmill. Will continue with exercise plan with goal to add another 10 minute interval in the day. She currently does 10 minutes in the morning and 10 minutes in the afternoon and will add goal to work up to 5 days/week. With diet, counseled to try to cut out the fried foods and increase fruits and vegetables. Continue pravastatin 20 mg daily and fish oil 1000 mg daily. f/u in 3 months.    Her updated medication list for this problem includes:    Pravastatin Sodium 20 Mg Tabs (Pravastatin sodium) .[name]... Take one tablet by mouth daily at bedtime  Patient Instructions: 1)  Continue taking pravastatin 20 mg daily and fish oil 1000 mg daily 2)  increase your exercise to 30 mins/day 5 days/week 3)  increase your fruits and vegetables 4)  decrease your fried foods 5)  Awesome job!  6)  lab appointment fasting- march 5th @ 9:00am 7)  lipid clinic- march 8th @ 9:00 am

## 2010-11-03 NOTE — Assessment & Plan Note (Signed)
Summary: NEW TO LIPID  Medications Added PRAVASTATIN SODIUM 20 MG TABS (PRAVASTATIN SODIUM) Take one tablet by mouth daily at bedtime      Allergies Added:    Visit Type:  Initial Consult   Lipid Management History:      Positive NCEP/ATP III risk factors include female age 61 years old or older and family history for ischemic heart disease (females less than 54 years old).  Negative NCEP/ATP III risk factors include no history of early menopause without estrogen hormone replacement, non-diabetic, HDL cholesterol greater than 60, non-tobacco-user status, non-hypertensive, no ASHD (atherosclerotic heart disease), no prior stroke/TIA, no peripheral vascular disease, and no history of aortic aneurysm.    Pleasant 60 y.o. F who comes to Lipid Clinic for increased cholesterol off of Lipitor since 6/11 due to muscle cramps/joint pain. Pt was well controlled and at goal on Lipitor. Muscle symptoms resolved following discontinuation of Lipitor. Currently taking fish oil 1000 mg daily. Pt reports trying to watch diet and exercise. Has been purchasing more fruit and recently got a treadmill.  Pt reports eating fried foods several times per week and eats out for lunch ([place]) several days of the week. She has been exercising for about 15 minutes a couple of times each week. Diet: Breakfast: usually oatmeal (this AM bacon, fried egg, hashbrowns) Lunch: eats out, but usually avoids fast food Dinner: cooks at home for husband. Favorites are fried fish and chicken, but she recognized these are poor choices  Lipid Management Provider  Reina Fuse, PharmD  Preventive Screening-Counseling & Management  Alcohol-Tobacco     Smoking Status: never  Caffeine-Diet-Exercise     Diet Counseling: to improve diet; diet is suboptimal     Does Patient Exercise: yes     Type of exercise: walking (treadmill)     Exercise (avg: min/session): <30     Times/week: <3     Exercise Counseling: to improve exercise  regimen  Current Medications (verified): 1)  Fish Oil 1000 Mg Caps (Omega-3 Fatty Acids) .... Take 1 Capsule By Mouth Once A Day 2)  Caltrate 600+d Plus 600-400 Mg-Unit Tabs (Calcium Carbonate-Vit D-Min) .... Take 1 Tab Daily.Marland KitchenMarland Kitchen 3)  Centrum Silver  Tabs (Multiple Vitamins-Minerals) .... Take 1 Tablet By Mouth Once A Day 4)  Vitamin D3 5000 Unit/ml Liqd (Cholecalciferol) .... Take 1 Cap Daily.Marland KitchenMarland Kitchen 5)  Pain Pill Per Dranderson... .... As Directed. 6)  Pravastatin Sodium 20 Mg Tabs (Pravastatin Sodium) .... Take One Tablet By Mouth Daily At Bedtime  Allergies (verified): 1)  ! * Decongestants 2)  ! Lipitor  Family History: mother deceased from heart problems (age 11) father alive age 36 1 sibling alive age 40 1 sibling alive age 48  Social History: Does Patient Exercise:  yes   Vital Signs:  Patient profile:   60 year old female Weight:      154.50 pounds BP sitting:   118 / 76  (left arm)  Impression & Recommendations:  Problem # 1:  HYPERCHOLESTEROLEMIA (ICD-272.0) Assessment Deteriorated Uncontrolled due to Lipitor discontinuation. TC:334 TG:170  HDL:60 LDL:238. LFTs WNL. Goal LDL<130. Initiate pravastatin 20 mg daily and continue fish oil 1000 mg daily. Discussed dietary changes including grilling [place] Dawes) fish and chicken rather than frying and eating more fruits and vegetables. Encouraged increasing exercise. Pt goal to increase exercise of walking on the treadmill to 15-30 min 3-4 times per week. Pt to f/u with Lipid clinic in 6 weeks to assess statin tolerance and need for increased dose.  F/U appt: 11/17@3pm , fasting labs 11/14@am  Her updated medication list for this problem includes:    Pravastatin Sodium 20 Mg Tabs (Pravastatin sodium) .[name]... Take one tablet by mouth daily at bedtime  Lipid Assessment/Plan:      Based on NCEP/ATP III, the patient's risk factor category is "0-1 risk factors".  The patient's lipid goals are as follows: Total cholesterol goal is 200;  LDL cholesterol goal is 160; HDL cholesterol goal is 40; Triglyceride goal is 150.  Her LDL cholesterol goal has not been met.  Secondary causes for hyperlipidemia have been ruled out.  She has been counseled on adjunctive measures for lowering her cholesterol and has been provided with dietary instructions.    Patient Instructions: 1)  1.) Start taking pravastatin 20 mg daily in the evening. 2)  2.) Continue taking Fish Oil. 3)  3.) Eat more fruits and vegetables. Use canola oil. 4)  4.) Watch out for fried foods! 5)  5.) Exercise on treadmill for 15-30 minutes 3-4 days per week. 6)  6.) Return to clinic in 6 weeks. 7)  Great seeing you today! Prescriptions: PRAVASTATIN SODIUM 20 MG TABS (PRAVASTATIN SODIUM) Take one tablet by mouth daily at bedtime  #30 x 2   Entered by:   Reina Fuse PharmD   Authorized by:   Michele Mcalpine MD   Signed by:   Reina Fuse PharmD on 07/06/2010   Method used:   Electronically to        [facility] (retail)       [address]       [place], [place]  [address]       Ph: [phone]       Fax: [phone]   RxID:   [id number]

## 2010-11-03 NOTE — Progress Notes (Signed)
Summary: reaction LMTCB x1  Phone Note Call from Patient Call back at [phone]   Caller: Patient Call For: nadel Summary of Call: pt having reaction to lipitor Initial call taken by: Rickard Patience,  March 30, 2010 3:36 PM  Follow-up for Phone Call        Spoke with pt.  She c/o muscle cramps and stiffness "all over"since started lipitor about 4 wks ago. Please advise,thanks! Follow-up by: Vernie Murders,  March 30, 2010 3:47 PM  Additional Follow-up for Phone Call Additional follow up Details #1::        per SN---looks like she will need to stop the lipitor---and rec refer to the lipid clinic---when her side effects settle down.  thanks Randell Loop CMA  March 30, 2010 4:51 PM     Additional Follow-up for Phone Call Additional follow up Details #2::    [facility] Gweneth Dimitri RN  March 30, 2010 5:03 PM  Spoke with pt.  Pt informed of above recs per SN.  Pt would like to know if she needs to be on a choloesterol med until sxs settle down.  Will forward message to SN-pls advise.  Thanks! Gweneth Dimitri RN  March 31, 2010 12:37 PM   no  she will need to follow low chol/low fat diet and the lipid clinic will follow up with her on this. Randell Loop CMA  March 31, 2010 12:43 PM   called and spoke with pt.  pt aware of SN's recs and verbalized understanding.  nothing further needed.  Aundra Millet Reynolds LPN  March 31, 2010 12:48 PM

## 2010-11-03 NOTE — Assessment & Plan Note (Signed)
Summary: CPX/FASTING/APC   CC:  Yearly ROV & CPX....  History of Present Illness: 60 y/o WF here for a follow up visit... she has multiple medical problems as noted below...    ~  September 24, 2008:  presents c/o her arthritis/ DJD... she has seen DrKuzma for severe DJD in hands, and he has rec mult joint replacements + hot soaks + Mobic 15mg /d... she is reluctant to committ to this extensive surg & wants a second opinion from Rheum- refer to [facility] al for their review... she also sees DrPool for Neurosurg- she has had ant neck surg (8/09= ant cerv discectomy & fusion at C5-6 for stenosis w/ myelopathy) and prev lumbar lam (10/06= right L4-5 laminotomy & microdiscectomy)... he has her on Vicodin Prn & she is getting PT... ?recently had another myelogram and MRI?[name].. she worked 60yrs at [employer] now w/ [employer] in [place] but she can't do the work and wonders about disability...   ~  June 13, 2009:  she has had severe arthritis symptoms in her hands and saw DrAnderson for Rheum- Rx Naprosyn 500Bid + Tramadol 50Bid...  she states that he is recommending disability due to her severe OA... also c/o "muscle spasms- all over my body" & we discussed trial Robaxin for this...   ~  June 23, 2010:  she is now on disability due the her arthritis per [place] P[place]- still sees him & DrPool for NS due to prev neck & lower back surg... on pain med per [place] Pellant (she doesn't know the name & didn't bring bottle)- this is her only perscription drug... she denies CP, palpit, SOB, edema;  she stopped Lipitor  ~10mo ago to to leg cramps;  denies GI or GU symptoms (sees DrGottsegen for GYN);  due for CXR, EKG, fasting blood work...    Current Problem List:  Health Maintenance - GYN= DrGottsegen w/ f/u PAP & Mammogram due now... she had prev BMD ?where... she was laid off work at [employer] where she had been for 60yrs, & worked for [employer] in [employer] for 64yrs, now on disability due to her  arthritis...  CHEST WALL PAIN, HX OF (ICD-V15.89),  MITRAL VALVE PROLAPSE, HX OF (ICD-V12.50) -  prev cardiac eval by DrNishan in 2005 due to coronary risk factors: pt's mother died suddenly in her 61's w/ MI... denies CP, palipit, dizziness, syncope, dyspnea, edema, etc...   ~  2DEcho 12/05 showed normal x ?HK inferiorly, EF= 55-60%...  ~  NuclearStressTest 12/05 showed normal- no ischemia, no scar, EF= 65%... no change from 2003.  ~  EKG= NSR, WNL.[name].  ~  CXR 9/10 & 9/11 showed NAD...  HYPERCHOLESTEROLEMIA (ICD-272.0) - prev followed in the lipid clinic... she was on Lip20 but stopped  ~6/11 due to leg cramps.  ~  FLP 8/08 showed TChol 224, TG 69, HDL 54, LDL 162... this was off meds- statin restarted.  ~  FLP 8/09 on Lip20 showed TChol 195, TG 100, HDL 65, LDL 110... improved- needs better diet.  ~  FLP 9/10 on Lip20 showed TChol 198, TG 104, HDL 61, LDL 116... she stopped  ~6/11 due to leg cramps.  ~  FLP 9/11 on diet alone showed TChol 334, TG 170, HDL 60, LDL 238... needs ret to [facility].  IRRITABLE BOWEL SYNDROME, HX OF (ICD-V12.79) - last colonoscopy 7/05 by DrStark was WNL- no divertics, no polyps, etc... f/u planned 31yrs...  DEGENERATIVE JOINT DISEASE (ICD-715.90),  NECK PAIN (ICD-723.1),  BACK PAIN (ICD-724.5) -  She  has a severe prob w/ neck pain and LBP... s/p MRI scans and prev Lumbar Lam in 2006 by DrPool... f/u myelogram and CT w/ bone spurs in neck and arthritis in her lower back... s/p ant cerv discectomy & fusion C6-7 for stenosis w/ myelopathy per DrPool... prev followed by DrBarker in [facility] but now relying on DrAnderson & DrPool... she also sees DrKuzma for Ortho w/ severe DJD of hands, Rx w/ Dosepak and Voltaren- min benefit & he has rec surgery but she is holding off & seeing DrAnderson for Rheum as noted...  ~  9/11:  she notes now on disability per [facility] who continues to follow her regularly.  VITAMIN D DEFICIENCY (ICD-268.9) - Vit D level 8/09 was 14 &  she was started on 50,000 u caps weekly, then switched to 5000 u daily on her own...    ~  labs 9/10 showed Vit D level = 35... OK to continue the 5000 u daily for now.  ~  labs 9/11 showed Vit D level = 39... rec to continue 5000 u daily...  ANXIETY DISORDER (ICD-300.00) - prev seen by psyche DrLisaPoulus & was on Celexa & Prosom... not currently on medications for nerves...   Preventive Screening-Counseling & Management  Alcohol-Tobacco     Smoking Status: never  Allergies: 1)  ! * Decongestants 2)  ! Lipitor  Comments:  Nurse/Medical Assistant: The patient's medications and allergies were reviewed with the patient and were updated in the Medication and Allergy Lists.  Past History:  Past Medical History: CHEST WALL PAIN, HX OF (ICD-V15.89) MITRAL VALVE PROLAPSE, HX OF (ICD-V12.50) HYPERCHOLESTEROLEMIA (ICD-272.0) IRRITABLE BOWEL SYNDROME, HX OF (ICD-V12.79) DEGENERATIVE JOINT DISEASE (ICD-715.90) NECK PAIN (ICD-723.1) BACK PAIN (ICD-724.5) VITAMIN D DEFICIENCY (ICD-268.9) ANXIETY DISORDER (ICD-300.00)  Past Surgical History: S/P D & C by DrGottsegen in 2004 S/P L4-5 right lumbar laminotomy & microdiscectomy 2006 by DrPool S/P C6-7 ant cerv discectomy & fusion 8/09 by DrPool  Family History: Reviewed history from 05/16/2008 and no changes required. mother deceased from heart problems father alive age 18 1 sibling alive age 42 1 sibling alive age 45  Social History: Reviewed history from 05/16/2008 and no changes required. non smoker exposed to second hand smoke 1 cup daily/caffeine married 2 children  Review of Systems       The patient complains of fatigue, malaise, back pain, joint pain, muscle cramps, stiffness, and arthritis.  The patient denies fever, chills, sweats, anorexia, weakness, weight loss, sleep disorder, blurring, diplopia, eye irritation, eye discharge, vision loss, eye pain, photophobia, earache, ear discharge, tinnitus, decreased hearing,  nasal congestion, nosebleeds, sore throat, hoarseness, chest pain, palpitations, syncope, dyspnea on exertion, orthopnea, PND, peripheral edema, cough, dyspnea at rest, excessive sputum, hemoptysis, wheezing, pleurisy, nausea, vomiting, diarrhea, constipation, change in bowel habits, abdominal pain, melena, hematochezia, jaundice, gas/bloating, indigestion/heartburn, dysphagia, odynophagia, dysuria, hematuria, urinary frequency, urinary hesitancy, nocturia, incontinence, joint swelling, muscle weakness, sciatica, restless legs, leg pain at night, leg pain with exertion, rash, itching, dryness, suspicious lesions, paralysis, paresthesias, seizures, tremors, vertigo, transient blindness, frequent falls, frequent headaches, difficulty walking, depression, anxiety, memory loss, confusion, cold intolerance, heat intolerance, polydipsia, polyphagia, polyuria, unusual weight change, abnormal bruising, bleeding, enlarged lymph nodes, urticaria, allergic rash, hay fever, and recurrent infections.    Vital Signs:  Patient profile:   60 year old female Height:      64 inches Weight:      155 pounds O2 Sat:      97 % on Room air Temp:  97.3 degrees F oral Pulse rate:   72 / minute BP sitting:   116 / 84  (left arm) Cuff size:   regular  Vitals Entered By: Randell Loop CMA (June 23, 2010 11:11 AM)  O2 Sat at Rest %:  97 O2 Flow:  Room air CC: Yearly ROV & CPX... Is Patient Diabetic? No Pain Assessment Patient in pain? no      Comments no changes in meds today   Physical Exam  Additional Exam:  WD, WN, 60 y/o WF in NAD... GENERAL:  Alert & oriented; pleasant & cooperative... HEENT:  [place]/AT, EOM-wnl, PERRLA, EACs-clear, TMs-wnl, NOSE-clear, THROAT-clear & wnl. NECK:  Supple w/ decrROM; scar of ant cerv discectomy; no JVD; normal carotid impulses w/o bruits;  no thyromegaly or nodules palpated; no lymphadenopathy. CHEST:  Clear to P & A; without wheezes/ rales/ or rhonchi. HEART:  Regular  Rhythm; without murmurs/ rubs/ or gallops. ABDOMEN:  Soft & nontender; normal bowel sounds; no organomegaly or masses detected. EXT: +heberdens & bouchards nodes;  no varicose veins/ venous insuffic/ or edema. NEURO:  CN's intact; motor testing normal; sensory testing normal; gait normal & balance OK. DERM:  No lesions noted; no rash etc...    CXR  Procedure date:  06/23/2010  Findings:      CHEST - 2 VIEW Comparison: 06/13/2009   Findings: Lung bases show minimal scarring and atelectasis.  No edema, nodule or infiltrate.  Heart size is normal.  No effusions. The bony thorax shows stable mild degenerative changes of the thoracic spine.   IMPRESSION: No active disease.   Read By:  Irish Lack,  M.D.   EKG  Procedure date:  06/23/2010  Findings:      Normal sinus rhythm with rate of:  72/min.. Tracing shows NSSTTWA, NAD...  SN   MISC. Report  Procedure date:  06/23/2010  Findings:      BMP (METABOL)   Sodium                    141 mEq/L                   135-145   Potassium                 4.0 mEq/L                   3.5-5.1   Chloride                  102 mEq/L                   96-112   Carbon Dioxide            30 mEq/L                    19-32   Glucose                   82 mg/dL                    25-95   BUN                       23 mg/dL                    6-38   Creatinine                0.7 mg/dL  0.4-1.2   Calcium                   9.3 mg/dL                   1.6-10.9   GFR                       86.26 mL/min                >60  Hepatic/Liver Function Panel (HEPATIC)   Total Bilirubin           0.9 mg/dL                   6.0-4.5   Direct Bilirubin          0.1 mg/dL                   4.0-9.8   Alkaline Phosphatase      46 U/L                      39-117   AST                       22 U/L                      0-37   ALT                       28 U/L                      0-35   Total Protein             6.7 g/dL                     1.1-9.1   Albumin                   4.1 g/dL                    4.7-8.2  CBC Platelet w/Diff (CBCD)   White Cell Count          6.3 K/uL                    4.5-10.5   Red Cell Count            4.33 Mil/uL                 3.87-5.11   Hemoglobin                14.6 g/dL                   95.6-21.3   Hematocrit                42.7 %                      36.0-46.0   MCV                       98.7 fl                     78.0-100.0   Platelet Count            259.0 K/uL  150.0-400.0   Neutrophil %              71.5 %                      43.0-77.0   Lymphocyte %              18.0 %                      12.0-46.0   Monocyte %                9.5 %                       3.0-12.0   Eosinophils%              0.6 %                       0.0-5.0   Basophils %               0.4 %                       0.0-3.0  Comments:      Lipid Panel (LIPID)   Cholesterol          [H]  334 mg/dL                   2-130   Triglycerides        [H]  170.0 mg/dL                 8.6-578.4   HDL                       69.62 mg/dL                 >95.28  Cholesterol LDL - Direct                             238.3 mg/dL   TSH (TSH)   FastTSH                   1.77 uIU/mL                 0.35-5.50  Vitamin D (25-Hydroxy) (41324)  Vitamin D (25-Hydroxy)                             39 ng/mL                    30-89   Impression & Recommendations:  Problem # 1:  PHYSICAL EXAMINATION (ICD-V70.0) She is now on disability due to her arthritis... Orders: T-2 View CXR (71020TC) 12 Lead EKG (12 Lead EKG) TLB-BMP (Basic Metabolic Panel-BMET) (80048-METABOL) TLB-Hepatic/Liver Function Pnl (80076-HEPATIC) TLB-CBC Platelet - w/Differential (85025-CBCD) TLB-Lipid Panel (80061-LIPID) TLB-TSH (Thyroid Stimulating Hormone) (84443-TSH) T-Vitamin D (25-Hydroxy) (40102-72536)  Problem # 2:  CHEST WALL PAIN, HX OF (ICD-V15.89) She denies recurrent CP, palpit, SOB, etc... encouraged to incr exercise...  Problem  # 3:  HYPERCHOLESTEROLEMIA (ICD-272.0) She needs Rx but difficult time w/ Statins & stopped Lip20 about 3-4 mo ago...  REC> refer to Lipid Clinic again for their help...  Problem # 4:  IRRITABLE BOWEL SYNDROME, HX OF (ICD-V12.79) She is currently asymptomatic & up  to date on screening colonoscopy...  Problem # 5:  DEGENERATIVE JOINT DISEASE (ICD-715.90) This is her CC & main prob>  followed by York Pellant for Rheum...  Problem # 6:  NECK PAIN (ICD-723.1) Hx signif neck & LBP followed by DrPool for NS...  Problem # 7:  VITAMIN D DEFICIENCY (ICD-268.9) She remains on Vit D OTC  ~5000 u daily...  Problem # 8:  ANXIETY DISORDER (ICD-300.00) No longer followed by Psychiatry & not currently on meds...  Complete Medication List: 1)  Fish Oil 1000 Mg Caps (Omega-3 fatty acids) .... Take 1 capsule by mouth once a day 2)  Caltrate 600+d Plus 600-400 Mg-unit Tabs (Calcium carbonate-vit d-min) .... Take 1 tab daily.Marland KitchenMarland Kitchen 3)  Centrum Silver Tabs (Multiple vitamins-minerals) .... Take 1 tablet by mouth once a day 4)  Vitamin D3 5000 Unit/ml Liqd (Cholecalciferol) .... Take 1 cap daily.Marland KitchenMarland Kitchen 5)  Pain Pill Per Dranderson...  .... As directed.  Patient Instructions: 1)  Today we updated your med list- see below.... 2)  Today we did your follow up CXR, EKG, and fasting blood work... 3)  please call the "phone tree" in a few days for your lab results.Marland KitchenMarland Kitchen  4)  Stay as active as poss and keep your weight down... 5)  Call for any problems.Marland KitchenMarland Kitchen 6)  Please schedule a follow-up appointment in 1 year, sooner as needed.

## 2010-11-03 NOTE — Progress Notes (Signed)
Summary: back xray  Phone Note Call from Patient Call back at Home Phone [phone]   Caller: Patient Call For: nadel Summary of Call: pt wants to know if she can see dr Kriste Basque and have an xray of her back? she see dr pool for her back pain, but it would be cheaper (ins coverage) if she had her xray here. pt says she also needs to have an rov w/ sn anyway.  Initial call taken by: Tivis Ringer, CNA,  April 02, 2010 4:39 PM  Follow-up for Phone Call        called and spoke with pt and she stated that she has appt with SN in september...she is just requesting to come in for xray of her back---she stated that she has to pay out of pocket when Dr. Dutch Quint does an xray---but her insurance will cover 100% if she has it here at our office.  please advise if ok for back xray done here. Randell Loop CMA  April 02, 2010 5:20 PM   Additional Follow-up for Phone Call Additional follow up Details #1::        per SN---Dr. Dutch Quint will want his own films---if he will write on an rx what he wants--we can order it from [facility]---but Dr. Dutch Quint can also do this for her if she will ask and let him know that she has to pay out of pocket to go anywhere else for these xrays.  thanks Randell Loop CMA  April 03, 2010 10:50 AM     Additional Follow-up for Phone Call Additional follow up Details #2::    PAtient is aware and will contact Dr. Ethelene Browns office regarding the cxr.Michel Bickers CMA  April 03, 2010 11:11 AM

## 2010-11-25 ENCOUNTER — Ambulatory Visit (INDEPENDENT_AMBULATORY_CARE_PROVIDER_SITE_OTHER): Payer: BC Managed Care – PPO | Admitting: Obstetrics and Gynecology

## 2010-11-25 DIAGNOSIS — B373 Candidiasis of vulva and vagina: Secondary | ICD-10-CM

## 2010-11-25 DIAGNOSIS — B3731 Acute candidiasis of vulva and vagina: Secondary | ICD-10-CM

## 2010-11-25 DIAGNOSIS — N390 Urinary tract infection, site not specified: Secondary | ICD-10-CM

## 2010-11-25 DIAGNOSIS — R82998 Other abnormal findings in urine: Secondary | ICD-10-CM

## 2010-11-25 DIAGNOSIS — N949 Unspecified condition associated with female genital organs and menstrual cycle: Secondary | ICD-10-CM

## 2010-12-02 ENCOUNTER — Other Ambulatory Visit: Payer: BC Managed Care – PPO

## 2010-12-02 ENCOUNTER — Ambulatory Visit (INDEPENDENT_AMBULATORY_CARE_PROVIDER_SITE_OTHER): Payer: BC Managed Care – PPO | Admitting: Obstetrics and Gynecology

## 2010-12-02 DIAGNOSIS — R141 Gas pain: Secondary | ICD-10-CM

## 2010-12-02 DIAGNOSIS — D259 Leiomyoma of uterus, unspecified: Secondary | ICD-10-CM

## 2010-12-02 DIAGNOSIS — N83339 Acquired atrophy of ovary and fallopian tube, unspecified side: Secondary | ICD-10-CM

## 2010-12-07 ENCOUNTER — Other Ambulatory Visit: Payer: Self-pay

## 2010-12-09 ENCOUNTER — Ambulatory Visit (INDEPENDENT_AMBULATORY_CARE_PROVIDER_SITE_OTHER): Payer: BC Managed Care – PPO | Admitting: Pulmonary Disease

## 2010-12-09 ENCOUNTER — Other Ambulatory Visit: Payer: Self-pay | Admitting: Pulmonary Disease

## 2010-12-09 ENCOUNTER — Other Ambulatory Visit (INDEPENDENT_AMBULATORY_CARE_PROVIDER_SITE_OTHER): Payer: BC Managed Care – PPO

## 2010-12-09 ENCOUNTER — Other Ambulatory Visit: Payer: Self-pay

## 2010-12-09 ENCOUNTER — Encounter (INDEPENDENT_AMBULATORY_CARE_PROVIDER_SITE_OTHER): Payer: Self-pay | Admitting: Pharmacist

## 2010-12-09 ENCOUNTER — Encounter: Payer: Self-pay | Admitting: Pulmonary Disease

## 2010-12-09 ENCOUNTER — Other Ambulatory Visit: Payer: BC Managed Care – PPO

## 2010-12-09 DIAGNOSIS — E7800 Pure hypercholesterolemia, unspecified: Secondary | ICD-10-CM

## 2010-12-09 DIAGNOSIS — R5383 Other fatigue: Secondary | ICD-10-CM

## 2010-12-09 DIAGNOSIS — E559 Vitamin D deficiency, unspecified: Secondary | ICD-10-CM

## 2010-12-09 DIAGNOSIS — Z79899 Other long term (current) drug therapy: Secondary | ICD-10-CM

## 2010-12-09 DIAGNOSIS — R5381 Other malaise: Secondary | ICD-10-CM

## 2010-12-09 DIAGNOSIS — E785 Hyperlipidemia, unspecified: Secondary | ICD-10-CM

## 2010-12-09 LAB — CBC WITH DIFFERENTIAL/PLATELET
Basophils Absolute: 0 10*3/uL (ref 0.0–0.1)
HCT: 42.8 % (ref 36.0–46.0)
Lymphs Abs: 1.1 10*3/uL (ref 0.7–4.0)
MCV: 97.6 fl (ref 78.0–100.0)
Monocytes Absolute: 0.5 10*3/uL (ref 0.1–1.0)
Platelets: 255 10*3/uL (ref 150.0–400.0)
RDW: 13.3 % (ref 11.5–14.6)

## 2010-12-09 LAB — BASIC METABOLIC PANEL
GFR: 82.22 mL/min (ref >60.00)
Glucose, Bld: 117 mg/dL — ABNORMAL HIGH (ref 70–99)
Potassium: 4.4 mEq/L (ref 3.5–5.1)
Sodium: 143 mEq/L (ref 135–145)

## 2010-12-09 LAB — HEPATIC FUNCTION PANEL
ALT: 26 U/L (ref 0–35)
AST: 24 U/L (ref 0–37)
AST: 24 U/L (ref 0–37)
Albumin: 4 g/dL (ref 3.5–5.2)
Alkaline Phosphatase: 48 U/L (ref 39–117)
Alkaline Phosphatase: 53 U/L (ref 39–117)
Bilirubin, Direct: 0.1 mg/dL (ref 0.0–0.3)
Total Bilirubin: 0.8 mg/dL (ref 0.3–1.2)
Total Protein: 6.5 g/dL (ref 6.0–8.3)

## 2010-12-09 LAB — LIPID PANEL
Cholesterol: 222 mg/dL — ABNORMAL HIGH (ref 0–200)
Triglycerides: 126 mg/dL (ref 0.0–149.0)

## 2010-12-09 LAB — TSH: TSH: 1.34 u[IU]/mL (ref 0.35–5.50)

## 2010-12-10 ENCOUNTER — Ambulatory Visit: Payer: Self-pay

## 2010-12-13 LAB — CONVERTED CEMR LAB: Vit D, 25-Hydroxy: 32 ng/mL (ref 30–89)

## 2010-12-17 ENCOUNTER — Ambulatory Visit: Payer: Self-pay

## 2010-12-21 ENCOUNTER — Ambulatory Visit: Payer: Self-pay

## 2010-12-22 NOTE — Assessment & Plan Note (Signed)
Summary: follow up   CC:  6 month ROV & review of mult medical problems....  History of Present Illness: 60 y/o WF here for a follow up visit... she has multiple medical problems including severe Hypercholesterolemia followed in the Lipid Clinic;  severe DJD/ Neck pain/ LBP followed by [facility] P[facility] for rheum & DrPool for Neurosurg (they do her pain management as well);  Anxiety prev eval by DrPoulas...    ~  June 23, 2010:  she is now on disability due the her arthritis per [facility]- still sees him & DrPool for NS due to prev neck & lower back surg... on pain med per [facility] (she doesn't know the name & didn't bring bottle)- this is her only perscription drug... she denies CP, palpit, SOB, edema;  she stopped Lipitor  ~27mo ago to to leg cramps;  denies GI or GU symptoms (sees DrGottsegen for GYN);  due for CXR, EKG, fasting blood work (TChol=334, LDL=238, sent to the [facility])...   ~  December 09, 2010:  She has mult somatic complaints> tired, sleepy, CP, SOB, aniety;  the chest discomfort sound like CWP & the SOB is a sensation like she can't get a deep breath;  under alot of stress she says & she thinks this affects her breathing;  not resting well & husb says she snores- we discussed sleep study to r/o OSA & in the meanwhile we will rx w/ KLONOPIN 0.5mg  Bid...    She is now followed in the Lipid Clinic due to her severe hypercholesterolemia> intol to Lipitor w/ aching & now on Prav20 + Fish OPil w/ some improvement in her numbers...    She continues to f/u w/ drAnderson & DrPool for her DJD, neck & back pain> currently only using Aleve Prn...    Labs today looked good> CBC, Fe, TSH- all normal; Chems OK x BS=117;  Vit D = 32 on her 5000 u daily supplement (continue same).   Current Problem List:  Health Maintenance - GYN= DrGottsegen w/ f/u PAP & Mammogram due now... she had prev BMD ?where... she was laid off work at [employer] where she had been for 15yrs, & worked for [employer] in [employer]  for 60yrs, now on disability due to her arthritis...  CHEST WALL PAIN, HX OF (ICD-V15.89),  MITRAL VALVE PROLAPSE, HX OF (ICD-V12.50) -  prev cardiac eval by DrNishan in 2005 due to coronary risk factors: pt's mother died suddenly in her 56's w/ MI... denies CP, palipit, dizziness, syncope, dyspnea, edema, etc...   ~  2DEcho 12/05 showed normal x ?HK inferiorly, EF= 55-60%...  ~  NuclearStressTest 12/05 showed normal- no ischemia, no scar, EF= 65%... no change from 2003.  ~  EKG= NSR, WNL.[name].  ~  CXR 9/10 & 9/11 showed NAD...  HYPERCHOLESTEROLEMIA (ICD-272.0) - prev followed in the lipid clinic... she was on Lip20 but stopped  ~6/11 due to leg cramps.  ~  FLP 8/08 showed TChol 224, TG 69, HDL 54, LDL 162... this was off meds- statin restarted.  ~  FLP 8/09 on Lip20 showed TChol 195, TG 100, HDL 65, LDL 110... improved- needs better diet.  ~  FLP 9/10 on Lip20 showed TChol 198, TG 104, HDL 61, LDL 116... she stopped  ~6/11 due to leg cramps.  ~  FLP 9/11 on diet alone showed TChol 334, TG 170, HDL 60, LDL 238... needs ret to LipidClinic.  IRRITABLE BOWEL SYNDROME, HX OF (ICD-V12.79) - last colonoscopy 7/05 by DrStark was WNL- no divertics, no  polyps, etc... f/u planned 72yrs...  DEGENERATIVE JOINT DISEASE (ICD-715.90),  NECK PAIN (ICD-723.1),  BACK PAIN (ICD-724.5) -  She has a severe prob w/ neck pain and LBP... s/p MRI scans and prev Lumbar Lam in 2006 by DrPool... f/u myelogram and CT w/ bone spurs in neck and arthritis in her lower back... s/p ant cerv discectomy & fusion C6-7 for stenosis w/ myelopathy per DrPool... prev followed by DrBarker in Pain Management but now relying on DrAnderson & DrPool... she also sees DrKuzma for Ortho w/ severe DJD of hands, Rx w/ Dosepak and Voltaren- min benefit & he has rec surgery but she is holding off & seeing DrAnderson for Rheum as noted...  ~  9/11:  she notes now on disability per [employer] who continues to follow her regularly.  VITAMIN D DEFICIENCY  (ICD-268.9) - Vit D level 8/09 was 14 & she was started on 50,000 u caps weekly, then switched to 5000 u daily on her own...    ~  labs 9/10 showed Vit D level = 35... OK to continue the 5000 u daily for now.  ~  labs 9/11 showed Vit D level = 39... rec to continue 5000 u daily...  ANXIETY DISORDER (ICD-300.00) - prev seen by psyche DrLisaPoulus & was on Celexa & Prosom... not currently on medications for nerves...   Preventive Screening-Counseling & Management  Alcohol-Tobacco     Smoking Status: never  Caffeine-Diet-Exercise     Diet Counseling: to improve diet; diet is suboptimal     Does Patient Exercise: yes     Type of exercise: walking (treadmill)     Exercise (avg: min/session): <30     Times/week: <3     Exercise Counseling: to improve exercise regimen  Allergies: 1)  ! * Decongestants 2)  ! Lipitor  Comments:  Nurse/Medical Assistant: The patient's medications and allergies were reviewed with the patient and were updated in the Medication and Allergy Lists.  Past History:  Past Medical History: CHEST WALL PAIN, HX OF (ICD-V15.89) MITRAL VALVE PROLAPSE, HX OF (ICD-V12.50) HYPERCHOLESTEROLEMIA (ICD-272.0) IRRITABLE BOWEL SYNDROME, HX OF (ICD-V12.79) DEGENERATIVE JOINT DISEASE (ICD-715.90) NECK PAIN (ICD-723.1) BACK PAIN (ICD-724.5) VITAMIN D DEFICIENCY (ICD-268.9) ANXIETY DISORDER (ICD-300.00)  Past Surgical History: S/P D & C by DrGottsegen in 2004 S/P L4-5 right lumbar laminotomy & microdiscectomy 2006 by DrPool S/P C6-7 ant cerv discectomy & fusion 8/09 by DrPool  Family History: Reviewed history from 07/06/2010 and no changes required. mother deceased from heart problems (age 59) father alive age 44 1 sibling alive age 52 1 sibling alive age 26  Social History: Reviewed history from 06/23/2010 and no changes required. non smoker exposed to second hand smoke 1 cup daily/caffeine married 2 children  Review of Systems      See HPI       The  patient complains of difficulty walking.  The patient denies anorexia, fever, weight loss, weight gain, vision loss, decreased hearing, hoarseness, chest pain, syncope, dyspnea on exertion, peripheral edema, prolonged cough, headaches, hemoptysis, abdominal pain, melena, hematochezia, severe indigestion/heartburn, hematuria, incontinence, muscle weakness, suspicious skin lesions, transient blindness, depression, unusual weight change, abnormal bleeding, enlarged lymph nodes, and angioedema.    Vital Signs:  Patient profile:   60 year old female Height:      64 inches Weight:      152.25 pounds O2 Sat:      96 % on Room air Temp:     97.2 degrees F oral Pulse rate:   75 / minute  BP sitting:   112 / 78  (right arm) Cuff size:   regular  Vitals Entered By: Randell Loop CMA (December 09, 2010 12:24 PM)  O2 Sat at Rest %:  96 O2 Flow:  Room air CC: 6 month ROV & review of mult medical problems... Is Patient Diabetic? No Pain Assessment Patient in pain? yes      Comments meds updated today    Physical Exam  Additional Exam:  WD, WN, 60 y/o WF in NAD... GENERAL:  Alert & oriented; pleasant & cooperative... HEENT:  [place]/AT, EOM-wnl, PERRLA, EACs-clear, TMs-wnl, NOSE-clear, THROAT-clear & wnl. NECK:  Supple w/ decrROM; scar of ant cerv discectomy; no JVD; normal carotid impulses w/o bruits;  no thyromegaly or nodules palpated; no lymphadenopathy. CHEST:  Clear to P & A; without wheezes/ rales/ or rhonchi. HEART:  Regular Rhythm; without murmurs/ rubs/ or gallops. ABDOMEN:  Soft & nontender; normal bowel sounds; no organomegaly or masses detected. EXT: +heberdens & bouchards nodes;  no varicose veins/ venous insuffic/ or edema. NEURO:  CN's intact; motor testing normal; sensory testing normal; gait normal & balance OK. DERM:  No lesions noted; no rash etc...    Impression & Recommendations:  Problem # 1:  DYSPNEA (ICD-786.05) She has CWP & dyspnea "like I can't get a deep breath" assoc  w/ incr stress/ anxiety...  CXR & EKG 9/11 reviewed > NAD... We discussed Rx w/ KLONOPIN 0.5mg  two times a day as a chest wall muscle relaxer & to help w/ her anxiety & dyspnea.  Problem # 2:  HYPERCHOLESTEROLEMIA (ICD-272.0) Follwed in the Lipid clinic now> on Prev20 + Fish Oil... f/u FLP not quite as good as last time> stressed diet/ exercise/ & they might consider incr the Prav to 40mg  dose... Her updated medication list for this problem includes:    Pravastatin Sodium 20 Mg Tabs (Pravastatin sodium) .[name]... Take one tablet by mouth daily at bedtime  Orders: TLB-BMP (Basic Metabolic Panel-BMET) (80048-METABOL) TLB-Hepatic/Liver Function Pnl (80076-HEPATIC) TLB-CBC Platelet - w/Differential (85025-CBCD) TLB-TSH (Thyroid Stimulating Hormone) (84443-TSH) TLB-IBC Pnl (Iron/FE;Transferrin) (83550-IBC) T-Vitamin D (25-Hydroxy) (62952-84132)  Problem # 3:  DEGENERATIVE JOINT DISEASE (ICD-715.90) Pain is currently under control & easily managed w/ Aleve... she indicates that she is on disability from [facility] due to her severe DJD. Her updated medication list for this problem includes:    Aleve 220 Mg Tabs (Naproxen sodium) .[name]... As needed for pain  Problem # 4:  VITAMIN D DEFICIENCY (ICD-268.9) Vit D level is stable in the 30's & rec to continue the 5000 u OTC daily supplement...  Problem # 5:  ANXIETY DISORDER (ICD-300.00) We decided on the [facility] 0.5mg  Bid trial... Her updated medication list for this problem includes:    Klonopin 0.5 Mg Tabs (Clonazepam) .[name]... Take 1 tab by mouth two times a day...  Complete Medication List: 1)  Pravastatin Sodium 20 Mg Tabs (Pravastatin sodium) .... Take one tablet by mouth daily at bedtime 2)  Fish Oil 1000 Mg Caps (Omega-3 fatty acids) .... Take 1 capsule by mouth once a day 3)  Caltrate 600+d Plus 600-400 Mg-unit Tabs (Calcium carbonate-vit d-min) .... Take 1 tab daily.Marland KitchenMarland Kitchen 4)  Centrum Silver Tabs (Multiple vitamins-minerals) .... Take 1 tablet  by mouth once a day 5)  Vitamin D3 5000 Unit/ml Liqd (Cholecalciferol) .... Take 1 cap daily.Marland KitchenMarland Kitchen 6)  Aleve 220 Mg Tabs (Naproxen sodium) .... As needed for pain 7)  Klonopin 0.5 Mg Tabs (Clonazepam) .... Take 1 tab by mouth two times a day.Marland KitchenMarland Kitchen  Other Orders: Diagnostic Polysomnogram (Diagnostic Polysomno)  Patient Instructions: 1)  Today we updated your med list- see below.... 2)  We decided to try [facility] (take 1 tab two times a day) to help relax the chest wall muscles and allow a deeper more satisfying breath... 3)  We will arrange for a Sleep Study to measure your sleep pattern... 4)  Today we did your follow up blood work as we discussed.[name]   5)  please call the "phone tree" in a few days for your lab results.Marland KitchenMarland Kitchen  6)  Stay as active as possible... 7)  Please schedule a follow-up appointment in 6 months. Prescriptions: KLONOPIN 0.5 MG TABS (CLONAZEPAM) take 1 tab by mouth two times a day...  #60 x 6   Entered and Authorized by:   Michele Mcalpine MD   Signed by:   Michele Mcalpine MD on 12/09/2010   Method used:   Print then Give to Patient   RxID:   [id number]

## 2010-12-24 ENCOUNTER — Ambulatory Visit (INDEPENDENT_AMBULATORY_CARE_PROVIDER_SITE_OTHER): Payer: BC Managed Care – PPO

## 2010-12-24 VITALS — BP 108/72 | Wt 150.0 lb

## 2010-12-24 DIAGNOSIS — E7800 Pure hypercholesterolemia, unspecified: Secondary | ICD-10-CM

## 2010-12-24 NOTE — Progress Notes (Signed)
Subjective:  [name] [name] is a 60 y.o. female who presents for follow-up of dyslipidemia. Current lipid regimen includes pravastatin 20 mg daily at bedtime, fish oil 1,000 mg daily, and flaxseed capsules once daily.  She reports compliance and denies any adverse events associated with medications.  The patient does not use medications that may worsen dyslipidemias (corticosteroids, progestins, anabolic steroids, diuretics, beta-blockers, amiodarone, cyclosporine, olanzapine).  Diet:  Pt reports that diet is not optimal.  She eats fried foods 3-4 times weekly and also states she needs to get more fruits and vegetables into her diet.  She also has a problem with portion control and states that she eats too many sweets.    Exercise:  She has fallen out of this routine over the previous couple months due to increased fatigue for unknown reason.  Pt had an MD appt to discuss this but no issues were found.  Previous history of exercising regularly, but lately she has only been exercising 1-2 times per week on her treadmill or row machine.  She generally attempts to exercise for 10-15 minutes at a time.  I have encouraged her to exercise for at least 20 minutes each time.    Cardiac disease includes: Hypertension  Cardiac Risk Factors Age > 45-female, > 55-female:  YES  +1  Smoking:   NO  Sig. family hx of CHD*:  NO  Hypertension:   NO  Diabetes:   NO  HDL < 35:   NO  HDL > 59:   NO  Total:  1   *Significant family history of CHD per NCEP = MI or sudden death at less than 61 year old in father or other 1st-degree female relative, or less than 8 year old in mother or other 1st-degree female relative.   Lipid Results:  TC 222 (goal<200), TG 126 (goal<150), HDL 56.7 (goal>45), LDL 153 (goal <130).  Review of Systems Pertinent items are noted in HPI.  Assessment: [name] is at goal for all lipid values except LDL.  LDL has deteriorated since last visit due to non-compliance with diet/exercise.   Patient is aware of this and is motivated to make the appropriate changes.  She would like to get back on track with lifestyle changes before increasing or changing her medication.  Overall, she demonstrates an understanding of how to improve diet and exercise, it is more a matter of discipline and sticking to the changes.    Plan: 1)  Continue taking Fish Oil, Flaxseed, and Pravastatin 2)  Increase fruit/vegetable intake 3)  Limit portion sizes 4)  Limit fried foods to only once weekly 5)  Resume exercise with goal of exercising at least twice weekly for 20 minutes each time 6)  Follow-up in 3 months

## 2010-12-24 NOTE — Patient Instructions (Addendum)
1)  No medication changes today.  Continue taking Pravastatin 20 mg nightly, Fish Oil, and Flaxseed capsules.   2)  Increase vegetable and fruit intake 3)  Limit portion sizes 4)  Limit fried foods to only once per week, opt for grilling or baking the other times 5)  Resume exercise at least twice weekly, attempt to exercise for 20 minutes each time 6)  Return for follow-up in 3 months

## 2011-02-16 NOTE — Op Note (Signed)
NAMEARABELLA, REVELLE                ACCOUNT NO.:  [id number]   MEDICAL RECORD NO.:  [id number]          PATIENT TYPE:  OIB   LOCATION:  3528                         FACILITY:  [facility]   PHYSICIAN:  Kathaleen Maser. Pool, M.D.    DATE OF BIRTH:  Mar 05, 1951   DATE OF PROCEDURE:  05/28/2008  DATE OF DISCHARGE:  05/28/2008                               OPERATIVE REPORT   PREOPERATIVE DIAGNOSIS:  C5-C6 stenosis with myelopathy.   POSTOPERATIVE DIAGNOSIS:  C5-C6 stenosis with myelopathy.   PROCEDURE:  C5-C6 anterior cervical diskectomy and fusion, allograft and  plating.   SURGEON:  Kathaleen Maser. Pool, MD.   ASSISTANT:  Reinaldo Meeker, MD   ANESTHESIA:  General endotracheal.   INDICATIONS:  Ms. Hanigan is a 60 year old female with history of neck  and bilateral upper extremity pain, paresthesias, and weakness with a  progressive cervical myelopathy.  Workup demonstrates evidence of  spondylosis with stenosis worse at C5-6 level with spinal cord  compression.  The patient was counseled as to her options.  She decided  to proceed with C5-C6 anterior cervical diskectomy fusion, allograft and  plating in hopes of improving her symptoms.   OPERATIVE NOTE:  The patient was placed on the operating table in supine  position.  After an adequate level of anesthesia was achieved, the  patient was placed supine with neck slightly extended up with Halter  traction.  The patient's anterior cervical region was prepped and draped  sterilely.  A #10 blade was used to make a curvilinear skin incision  overlying the C5-C6 interspace.  This was carried down sharply to the  platysma.  The platysma was then divided vertically and dissection  proceeded along the medial border of the sternomastoid muscle and  carotid sheath.  Trachea and esophagus were mobilized tracked towards  the left.  Prevertebral fascia stripped off the anterior spinal column.  Longus colli muscles were then elevated bilaterally using  electrocautery.  Deep self-retaining retractors were placed.  Intraoperative fluoroscopy was used and levels were confirmed.  Disk  space at C5-C6 was then incised with a 15 blade in rectangular fashion.  Wide disk space clean-out was achieved using pituitary rongeurs, forward  and backward, and Carlen curettes, Kerrison rongeurs, and high-speed  drill.  Elements of the disk were removed down towards the posterior  annulus.  Microscope was then brought into the field and used throughout  the remainder of diskectomy.  Remaining aspects of annulus and  osteophytes were removed using high-speed drill down to the level of the  posterior longitudinal ligament.  Posterior longitudinal ligament was  then elevated and resected in piecemeal fashion using Kerrison rongeurs.  The thecal sac was identified.  A wide central decompression was then  performed undercutting the bodies of C5 and C6.  Decompression then  proceeded out to each neural foramen.  Wide anterior foraminotomies were  then performed along the course of exiting nerve roots bilaterally.  At  this point, a very thorough decompression have been achieved.  There was  no other injury to the thecal sac or nerve roots.  Wound  was then  irrigated with antibiotic solution.  Gelfoam was placed topically for  hemostasis which was found to be good.  A 6-mm LifeNet allograft wedge  was then packed into place and recessed roughly 1 mm from the anterior  cortical surface.  A 25-mm Atlantis anterior cervical plate was then  placed over the C5 and C6 levels.  This was then attached under  fluoroscopic guidance using 30 mL variable screws to each of both  levels.  All 4 screws were given final tightening and found to be solid  within bone.  The locking screws were engaged in both levels.  Final  images revealed good position of the bone grafts and hardware with  proper operative level and normal alignment of spine.  Wound was then  irrigated with  antibiotic solution.  Hemostasis was ensured with bipolar  electrocautery.  The wound was then closed in typical fashion.  Steri-  Strips and sterile dressings were applied.  There were no complications,  tolerated the procedure well, and she returned to the recovery room  postoperatively.           ______________________________  Kathaleen Maser Pool, M.D.     HAP/MEDQ  D:  05/28/2008  T:  05/29/2008  Job:  [id number]

## 2011-02-16 NOTE — Assessment & Plan Note (Signed)
[facility]                               LIPID CLINIC NOTE   [name], [name]                       MRN:          [id number]  DATE:06/08/2007                            DOB:          [birth year]    PAST MEDICAL HISTORY:  1. Hyperlipidemia.  2. Mitral valve prolapse.  3. Irritable bowel syndrome.  4. Chronic fatigue syndrome.   CURRENT MEDICATIONS:  None.   VITAL SIGNS: Weight 151 pounds, blood pressure 118/70, heart rate 80.   LABORATORY DATA:  Total cholesterol 224, triglycerides 69, HDL 53, LDL  162. LFTs within normal limits.   ASSESSMENT:  [name] [name] is a pleasant woman who returns to the lipid  clinic today with no chest pain, no shortness of breath, no muscle aches  or pain. She is noncompliant with medication regimen. She has self  discontinued her Prosom, Celexa and her Lipitor. She had started taking  aspirin with plant sterols over-the-counter thinking this would be  beneficial to her cholesterol panel. She also said that she had started  eating more healthy and decreasing the amount of fats and fried foods in  her diet. She also had planned and wanted to start an exercise regimen.  However, she has not yet started that as of this date. Upon review, her  total cholesterol is greater than goal of less than 200. Her  triglycerides are at goal of less 150, her HDL is at goal of greater  than 40 and her LDL is significantly increased and greater than goal of  less than 100. Non-HDL is greater than goal of less than 130. After  lengthy discussion with [name] [name] discussing risks and benefits of  statin therapy versus red yeast rice or plant sterols, lifestyle  modification, she is agreeable to restart her Lipitor 20 mg daily. She  will continue taking her aspirin and states that she will be more  compliant. She also is complaining of some cramping in her calves and in  her toes especially in the evening time. She states that she  drinks  maybe one glass of water a day and very other little fluids. I have  encouraged her to increase the amount of water to four large glasses of  water a day at the minimum and to add some fruit with some potassium in  it such bananas and cantaloupe and to stretch out her calves and her  feet at the end of the day when she gets home from work. Hopefully,  these small changes will help in her muscle cramping which has not  stopped after stopping Lipitor so I do not think that these two are  related. She has decreased the amount of fried food in her diet. She  eats fried fish once every two weeks and very occasional shrimp maybe  once a month. She has eats oatmeal or Cheerios for breakfast with some  fruit. She eats fairly low-fat, no carbohydrate meals and is willing to  start exercising regimen, although she walks maybe five minutes every  other day, I have encouraged her to start  an exercise regimen of 15  minutes every day to help get her in the habit of exercising and we will  increase the time as patient tolerates. I think the most important thing  is to get [name] [name] into a routine that is manageable for her and then  to increase her time of exercise as the routine is established.   PLAN:  1. Restart Lipitor 20 mg daily.  2. Decrease fried foods in diet and snacking in diet.  3. Restart exercise regimen.  4. Followup visit in three months for lipid panel and LFTs and will      make adjustments with me at that time.      Leota Sauers, PharmD  Electronically Signed      Jesse Sans. Daleen Squibb, MD, [facility]  Electronically Signed   LC/MedQ  DD: 06/08/2007  DT: 06/08/2007  Job #: [id number]

## 2011-02-16 NOTE — Assessment & Plan Note (Signed)
[facility]                               LIPID CLINIC NOTE   [name], [name]                       MRN:          [id number]  DATE:10/26/2007                            DOB:          01-07-1951    PAST MEDICAL HISTORY:  1. Hyperlipidemia.  2. Mitral valve prolapse.  3. Irritable bowel syndrome.  4. Chronic fatigue syndrome.   MEDICATIONS:  Lipitor 20 mg daily.   VITAL SIGNS:  Weight 155 pounds, blood pressure 110/60, heart rate 80.   LABORATORY DATA:  Total cholesterol 236, triglycerides 96, LDL 148, HDL  69, LFTs within normal limits.   ASSESSMENT:  [name] [name] is a very pleasant 60 year old woman who returns  to lipid clinic today with no chest pain, no shortness of breath, no  muscle aches or pains.  She is compliant with current medication  regimen; however, she has recently been out of her Lipitor.  She ran out  of samples and was unable to attain any more.  She had recently lost her  job, was unable to afford $130 payment for Lipitor; however, she has  just recently gotten a new job and has insurance, and has restarted her  Lipitor 3 weeks ago.  Her cholesterol panel was drawn while off of her  statin therapy.  Her total cholesterol is greater than goal of less than  200, triglycerides at goal of less than 150, HDL at goal of greater than  40, and LDL greater than goal of less than 100.  She exercises  occasionally.  She had previously been walking 2 to 3 miles once or  twice a week; however, she has decreased this given the cold weather,  and has back pain that goes down into her legs.  She had an MRI and is  planning on having some type of surgery in the upcoming weeks.  We both  agreed that this pain in her legs is not attributed or related to the  medications.  She also has increased the fats and carbohydrates in her  diet.  She has increased large portions of food on a daily basis.  She  has also started snacking on ice cream at  night.  She says that she  would like to lose some weight and wants to be healthier, and does agree  to decrease her portion sizes and to decrease the amount of ice cream to  once or twice a week.   PLAN:  1. Restart Lipitor 20 mg daily.  2. Restart exercise regimen.  3. Decrease fat and carbohydrates in diet.  4. Followup visit in 3 months for lipid panel and liver function      tests, and we will make adjustments at that time.      Leota Sauers, PharmD  Electronically Signed      Jesse Sans. Daleen Squibb, MD, [facility]  Electronically Signed   LC/MedQ  DD: 10/26/2007  DT: 10/26/2007  Job #: [id number]

## 2011-02-19 NOTE — Op Note (Signed)
[name] [name], [name] [name]                ACCOUNT NO.:  [id number]   MEDICAL RECORD NO.:  [id number]          PATIENT TYPE:  INP   LOCATION:  3036                         FACILITY:  [facility]   PHYSICIAN:  Kathaleen Maser. Pool, M.D.    DATE OF BIRTH:  1951/09/25   DATE OF PROCEDURE:  07/19/2005  DATE OF DISCHARGE:                                 OPERATIVE REPORT   PREOPERATIVE DIAGNOSIS:  Right L4-5 herniated nucleus pulposus __________ .   POSTOPERATIVE DIAGNOSIS:  Right L4-5 herniated nucleus pulposus __________ .   PROCEDURE NAME:  Right L4-5 laminotomy and microdiskectomy.   SURGEON:  Kathaleen Maser. Pool, M.D.   ASSISTANT:  Donalee Citrin, M.D.   ANESTHESIA:  General oroendotracheal.   INDICATIONS:  [name] [name] is a 60 year old female with a history of back and  right lower extremity pain.  The patient has subsequently eased.  The  patient has marked radicular weakness and sensory loss involving an L5  myotome and dermatomal distribution.  She has a near complete right-sided  foot drop.  She is also beginning to have evidence of skin breakdown  secondary to lack of sensation in her right foot.  Workup demonstrates  evidence of an L4-5 disc herniation with an inferior free fragment causing  marked compression of the right-sided L5 nerve root.  We have discussed  options over management, including the possibility of undergoing a right-  sided L4-5 laminotomy and microdiskectomy in hopes of improving her  symptoms.  She is aware that her motor symptoms as well as her sensory  symptoms may not recover, but that surgical decompression is her best  option.   OPERATIVE NOTE:  The patient was brought to the operating room and placed on  the table in a supine position.  After an adequate level of anesthesia was  achieved, the patient was positioned prone onto the Wilson frame.  Appropriately padded.  The lumbar region was prepped and draped sterilely.  A 10 blade was used to make a linear skin incision  overlying the L4-L5  interspace.  This was carried down sharply in the midline.  A subperiosteal  dissection was then performed, exposing the lamina and facet joints of L4  and L5 on the right side.  Deep self-retaining retractors were placed.  Intraoperative x-rays were taken.  Level was confirmed.  A laminotomy was  then performed using a high-speed drill and Kerrison rongeurs to remove the  inferior aspect of the lamina of L4, the medial aspect of the L4-5 facet  joint, and superior rim of the L5 lamina.  Ligamentum flavum was then  elevated and resected in a piecemeal fashion using Kerrison.  Underlying  thecal sac and exiting L5 nerve root were identified.  The microscope was  brought into the field for microdissection of the right-sided L5 nerve root  and underlying disc herniation.  Epidural __________  coagulated and cut.  Thecal sac and L5 nerve root were mobilized and turned toward the midline.  The disc herniation was readily apparent.  This was then dissected free  using blunt nerve hooks.  This was then  removed in a piecemeal fashion using  pituitary rongeurs.  The disc space itself was then incised with a 15 blade  in a rectangular fashion.  A wide disc space cleanout was then achieved  using pituitary rongeurs, __________  rongeurs, and Epstein curettes.  When  all elements of the disc herniation were completely resected, all loose or  obviously degenerative disc material was removed from the interspace.  After  a very thorough diskectomy had been achieved, the wound was inspected.  There was no evidence of any injury to the thecal sac or nerve roots.  There  was no evidence of any residual compression upon the thecal sac or nerve  roots.  The wound was then irrigated with antibiotic solution.  Gelfoam was  placed topically for hemostasis and found to be good.  Microscope  and retractor system were removed.  Hemostasis was achieved with  electrocautery.  Wounds were closed in  layers with Vicryl sutures.  Steri-  Strips and a sterile dressing were applied.  There were no apparent  complications.  The patient tolerated the procedure well, and she returned  to the recovery room postoperatively.           ______________________________  Kathaleen Maser Pool, M.D.     HAP/MEDQ  D:  07/19/2005  T:  07/19/2005  Job:  [id number]

## 2011-02-19 NOTE — Assessment & Plan Note (Signed)
[facility]                              CARDIOLOGY OFFICE NOTE   LERIN, JECH                       MRN:          [id number]  DATE:04/14/2006                            DOB:          [birth year]    REFERRING PHYSICIAN:  Dr. Kriste Basque   PAST MEDICAL HISTORY:  Hyperlipidemia.   FAMILY HISTORY:  1.  Coronary artery disease.  2.  Mitral valve prolapse.  3.  Irritable bowel syndrome.  4.  Degenerative joint disease  5.  Anxiety.   CURRENT MEDICATIONS:  1.  Lipitor 20 mg daily.  2.  Multivitamins.   DRUG ALLERGIES:  NONE noted.   VITAL SIGNS:  Weight 159 pounds, blood pressure 120/60, heart rate 76.   LAB DATA:  Total cholesterol 172, triglycerides 80, HDL 52, LDL 105.   ASSESSMENT:  Ms. Bloodgood is a pleasant 59 year old woman who returns to lipid  clinic today with no chest pain, no shortness of breath, no muscle aches or  pain.  She is compliant with our current medication regimen.  She is very  leery about being back on any type of medication but is willing to continue  her Lipitor 20.  She is not having any problems, is tolerating it well.  She  seems to be very excited about lifestyle modification.  She has restarted  exercising by walking 30 minutes 4 x a week.  She has made some small  changes in her diet.  She does  not follow a heart healthy diet but has  asked for some information about things to use and things to avoid.  She  also is very willing to make significant changes in the way that she cooks  and the oils used, etcetera.  We spent a lengthy time discussing her diet  and small changes that she could make to help decrease her LDL.  Total  cholesterol, her goal less than 200, triglycerides, her goal less than 150,  HDL goal greater than 40, LDL goal of 100.   PLAN:  1.  Continue current medication therapy and Lipitor 20 mg daily.  2.  Continue exercise regimen 30 minutes 4 x a week.  3.  Decreased fats and carbohydrates in  diet.  4.  Follow up visit in 6 months for lipid panel and liver function tests,      and to make any changes that will be warranted at that time.                                  Leota Sauers, PharmD    LC/MedQ  DD:  04/14/2006  DT:  04/14/2006  Job #:  [id number]

## 2011-02-19 NOTE — Assessment & Plan Note (Signed)
[facility]                               LIPID CLINIC NOTE   [name], [name]                       MRN:          [id number]  DATE:01/05/2007                            DOB:          1951/09/02    [name] [name] is seen back in the lipid clinic, as she continues on her  Lipitor 20 mg daily at bedtime.  She has been making appropriate  selections of lower fat snacks and has made fewer fried food selections  in the past several months.  She continues her active lifestyle,  although she does not routinely exercise.  She has been compliant with  her drug therapy of Lipitor 20 mg daily, although she has possibly had  some leg cramping, although this may also be due to her daily  activities. She continues to avoid tobacco and does not drink alcohol  regularly.   Past medical history is pertinent for a history of elevated cholesterol  and medication intolerance in the past.  A history of mitral valve  prolapse.  A history of irritable bowel, chronic fatigue and tiredness,  and a remote history of anemia.   Current medications include Lipitor 20 mg daily at bedtime.   REVIEW OF SYSTEMS:  As stated in the HPI, otherwise negative.   PHYSICAL EXAMINATION:  VITAL SIGNS:  Weight today is 154 pounds.  Blood  pressure is 112/74.  Heart rate is 84.   Labs on January 03, 2007 reveal a total cholesterol of 145, triglycerides  84, HDL 51.6, LDL 77.  LFTs are within normal limits.   ASSESSMENT:  The patient is at goal on Lipitor 20 mg daily.   PLAN:  1. Continue this therapy for now.  2. Continue to work on a low fat, low cholesterol diet and work to      increase physical activity and exercise with heart rate increases,      as discussed, up to three times a week for 30 minutes.  3. Patient will follow up in six months.  4. She will call with questions or problems in the meantime.      [name] [name], PharmD, BCPS, CPP  Electronically Signed      Rollene Rotunda, MD, [facility]  Electronically Signed   MP/MedQ  DD: 01/11/2007  DT: 01/11/2007  Job #: [id number]   cc:   Lonzo Cloud. Kriste Basque, MD

## 2011-02-19 NOTE — Op Note (Signed)
[name] [name], [name] [name]                          ACCOUNT NO.:  [id number]   MEDICAL RECORD NO.:  [id number]                   PATIENT TYPE:  AMB   LOCATION:  [facility]                                 FACILITY:  [facility]   PHYSICIAN:  Daniel L. Eda Paschal, M.D.           DATE OF BIRTH:  March 27, 1951   DATE OF PROCEDURE:  05/09/2003  DATE OF DISCHARGE:                                 OPERATIVE REPORT   PREOPERATIVE DIAGNOSIS:  Menorrhagia and endometrial polyp.   POSTOPERATIVE DIAGNOSIS:  Menorrhagia and endometrial polyp.   OPERATION/PROCEDURE:  1. Hysteroscopy.  2. Dilatation and curettage.   SURGEON:  Daniel L. Eda Paschal, M.D.   ANESTHESIA:  General.   INDICATIONS:  The patient is a 60 year old female who had presented to the  office with menorrhagia.  On ultrasound she has and endometrial polyp.  She  now enters the hospital for hysteroscopy, dilatation and curettage and  excision of the above.   FINDINGS:  External and vaginal examination is normal.  Cervix is clean.  Uterus is slightly enlarged.  There is 1.5 degrees of uterine descensus.  Adnexa are palpably normal.  At the time of hysteroscopic examination, the  patient had an endometrial polyp neat the top of the fundus on the left  wall.  It was about 1.5 cm.  Other than that, findings were completely  normal on hysteroscopic examination.   DESCRIPTION OF PROCEDURE:  After adequate general anesthesia, the patient  was placed in the dorsal lithotomy position, prepped and draped in the usual  sterile manner.  A single-tooth tenaculum was placed on the anterior lip of  the cervix.  The cervix was dilated to #[facility] dilator.  The hysteroscopic  examination was done with the hysteroscopic resectoscope.  Three percent  sorbitol was used to expand the intrauterine cavity.  A camera was used for  magnification.  Only abnormal finding was the polyp was described above.  A  curettage was next done because of the fact that the patient  had started her  period and it was felt that the polyp probably would be removed with  curettage and it would also clear the cavity so we could see better.  Curettings were moderate and the polyp came out with the curettings.  The  patient was then rehysteroscoped and she now had a clean cavity without any  intrauterine pathology.  The procedure was terminated.  Blood loss for  entire procedure was less than 50 mL with none replaced.  Fluid deficit was  also less than 100 mL.                                                Daniel L. Eda Paschal, M.D.    [name]  D:  05/09/2003  T:  05/09/2003  Job:  [id number]  

## 2011-03-23 ENCOUNTER — Other Ambulatory Visit: Payer: BC Managed Care – PPO | Admitting: *Deleted

## 2011-03-25 ENCOUNTER — Ambulatory Visit: Payer: BC Managed Care – PPO

## 2011-04-02 ENCOUNTER — Encounter: Payer: Self-pay | Admitting: Gastroenterology

## 2011-06-02 ENCOUNTER — Other Ambulatory Visit: Payer: Self-pay | Admitting: Neurosurgery

## 2011-06-02 DIAGNOSIS — M545 Low back pain, unspecified: Secondary | ICD-10-CM

## 2011-06-11 ENCOUNTER — Other Ambulatory Visit: Payer: BC Managed Care – PPO

## 2011-06-14 ENCOUNTER — Ambulatory Visit
Admission: RE | Admit: 2011-06-14 | Discharge: 2011-06-14 | Disposition: A | Discharge status: Home / Self Care | Payer: BC Managed Care – PPO | Source: Ambulatory Visit | Attending: Neurosurgery | Admitting: Neurosurgery

## 2011-06-14 DIAGNOSIS — M545 Low back pain, unspecified: Secondary | ICD-10-CM

## 2011-06-25 ENCOUNTER — Encounter: Payer: Self-pay | Admitting: Pulmonary Disease

## 2011-07-02 ENCOUNTER — Ambulatory Visit (INDEPENDENT_AMBULATORY_CARE_PROVIDER_SITE_OTHER): Payer: BC Managed Care – PPO | Admitting: Pulmonary Disease

## 2011-07-02 ENCOUNTER — Other Ambulatory Visit: Payer: Self-pay | Admitting: Pulmonary Disease

## 2011-07-02 ENCOUNTER — Other Ambulatory Visit (INDEPENDENT_AMBULATORY_CARE_PROVIDER_SITE_OTHER): Payer: BC Managed Care – PPO

## 2011-07-02 ENCOUNTER — Encounter: Payer: Self-pay | Admitting: Pulmonary Disease

## 2011-07-02 VITALS — BP 126/88 | HR 72 | Temp 97.5°F | Ht 64.0 in | Wt 153.8 lb

## 2011-07-02 DIAGNOSIS — M1990 Unspecified osteoarthritis, unspecified site: Secondary | ICD-10-CM

## 2011-07-02 DIAGNOSIS — Z0000 Encounter for general adult medical examination without abnormal findings: Secondary | ICD-10-CM

## 2011-07-02 DIAGNOSIS — Z8719 Personal history of other diseases of the digestive system: Secondary | ICD-10-CM

## 2011-07-02 DIAGNOSIS — M542 Cervicalgia: Secondary | ICD-10-CM

## 2011-07-02 DIAGNOSIS — F411 Generalized anxiety disorder: Secondary | ICD-10-CM

## 2011-07-02 DIAGNOSIS — E559 Vitamin D deficiency, unspecified: Secondary | ICD-10-CM

## 2011-07-02 DIAGNOSIS — Z9189 Other specified personal risk factors, not elsewhere classified: Secondary | ICD-10-CM

## 2011-07-02 DIAGNOSIS — M549 Dorsalgia, unspecified: Secondary | ICD-10-CM

## 2011-07-02 DIAGNOSIS — Z23 Encounter for immunization: Secondary | ICD-10-CM

## 2011-07-02 DIAGNOSIS — Z8679 Personal history of other diseases of the circulatory system: Secondary | ICD-10-CM

## 2011-07-02 DIAGNOSIS — E7800 Pure hypercholesterolemia, unspecified: Secondary | ICD-10-CM

## 2011-07-02 LAB — LIPID PANEL
Cholesterol: 266 mg/dL — ABNORMAL HIGH (ref 0–200)
HDL: 61.5 mg/dL (ref >39.00)
VLDL: 27.6 mg/dL (ref 0.0–40.0)

## 2011-07-02 LAB — HEPATIC FUNCTION PANEL: Total Bilirubin: 0.9 mg/dL (ref 0.3–1.2)

## 2011-07-02 LAB — BASIC METABOLIC PANEL
BUN: 24 mg/dL — ABNORMAL HIGH (ref 6–23)
Creatinine, Ser: 0.6 mg/dL (ref 0.4–1.2)
GFR: 100.06 mL/min (ref >60.00)

## 2011-07-02 LAB — LDL CHOLESTEROL, DIRECT: Direct LDL: 183.9 mg/dL

## 2011-07-02 NOTE — Progress Notes (Signed)
Subjective:    Patient ID: [name] [name], female    DOB: December 06, 1950, 60 y.o.   MRN: [id number]  HPI 60 y/o WF here for a follow up visit... she has multiple medical problems including severe Hypercholesterolemia prev followed in the Lipid Clinic;  severe DJD/ Neck pain/ LBP followed by [facility] Pellant for Rheum & DrPool for Neurosurg (they do her pain management as well);  Anxiety prev eval by DrPoulas...   ~  June 23, 2010:  she is now on disability due the her arthritis per [facility]- still sees him & DrPool for NS due to prev neck & lower back surg... on pain med per [facility] Pellant (she doesn't know the name & didn't bring bottles)- this is her only perscription drug... she denies CP, palpit, SOB, edema;  she stopped Lipitor ~5mo ago to to leg cramps;  denies GI or GU symptoms (sees DrGottsegen for GYN);  due for CXR, EKG, fasting blood work (TChol=334, LDL=238, sent to the [facility])...  ~  December 09, 2010:  She has mult somatic complaints> tired, sleepy, CP, SOB, anxiety;  the chest discomfort sound like CWP & the SOB is a sensation like she can't get a deep breath;  under alot of stress she says & she thinks this affects her breathing;  not resting well & husb says she snores- we discussed sleep study to r/o OSA & in the meanwhile we will rx w/ KLONOPIN 0.5mg  Bid...    She is now followed in the Lipid Clinic for her severe hypercholesterolemia> intol to Lipitor w/ aching & now on Prav20 + Fish Oil w/ some improvement in her numbers.    She continues to f/u w/ DrAnderson & DrPool for her DJD, neck & back pain> currently only using Aleve Prn...    Labs today looked good> CBC, Fe, TSH- all normal; Chems OK x BS=117;  Vit D = 32 on her 5000 u daily supplement (continue same).  ~  July 03, 2011:  76mo ROV & she is feeling well, just ret from an [place] cruise; she's been exercising on a treadmill she says & thinks it's helping; she did not bring med bottles or med list to OV today...    MVP>  She notes occas  self limited palpit on & off; denies CP/ angina etc; recall hx mother died suddenly in her 42's w/ MI; we reviewed risk factor reduction strategy...    Chol>  On Prav20 per LipidClinic w/ FLPs recorded below; repeat FLP not as good today w/ LDL up to 184 & she is encouraged to incr Pravachol to 40 & f/u LC; she is also taking Fish Oil & Flax Seed Oil per her preference...    IBS>  She denies abd pain, n/v/d/c/blood in stools etc; states she's doing satis w/o GI issues at present...    DJD/ neck pain, back pain>  She continues her f/u w/ DrAnderson for Rheum & DrPool for NS; they manage her chronic pain as well & just on Naprosyn 220mg  Bid as needed now; plus her Oscal, etc...    Vit D defic>  On Vit D 5000u daily supplement; last Vit D level was 32 in 3/12 & rec to continue same for now...    Anxiety>  We gave her Klonopin 0.5mg Bid last OV but she ca't remember taking it, ?if it helped, not taking now; pt still anxious & stressed- declines med Rx...          Problem List:  CHEST WALL PAIN, HX OF (ICD-V15.89),  MITRAL VALVE PROLAPSE, HX OF (ICD-V12.50) -  prev cardiac eval by Walker Kehr in 2005 due to coronary risk factors: pt's mother died suddenly in her 60's w/ MI... denies CP, palipit, dizziness, syncope, dyspnea, edema, etc...  ~  2DEcho 12/05 showed normal x ?HK inferiorly, EF= 55-60%... ~  NuclearStressTest 12/05 showed normal- no ischemia, no scar, EF= 65%... no change from 2003. ~  EKG= NSR, WNL.[name]. ~  CXR 9/10 & 9/11 showed NAD...  HYPERCHOLESTEROLEMIA (ICD-272.0) - prev followed in the lipid clinic... she was on Lip20 but stopped ~6/11 due to leg cramps. ~  FLP 8/08 showed TChol 224, TG 69, HDL 54, LDL 162... this was off meds- statin restarted. ~  FLP 8/09 on Lip20 showed TChol 195, TG 100, HDL 65, LDL 110... improved- needs better diet. ~  FLP 9/10 on Lip20 showed TChol 198, TG 104, HDL 61, LDL 116... she stopped ~6/11 due to leg cramps. ~  FLP 9/11 on diet alone showed TChol 334, TG  170, HDL 60, LDL 238... needs ret to LipidClinic. ~  FLP 11/11 on Prav20 showed TChol 212, TG 111, HDL 54, LDL 129 ~  FLP 3/12 on Prav20 showed TChol 222, TG 126, HDL 57, LDL 153 ~  FLP 9/12 on Prav20 showed TChol 266, TG 138, HDL 62, LDL 184... Needs to incr Prav40 & f/u LC.  IRRITABLE BOWEL SYNDROME, HX OF (ICD-V12.79) - last colonoscopy 7/05 by DrStark was WNL- no divertics, no polyps, etc... f/u planned 63yrs...  DEGENERATIVE JOINT DISEASE (ICD-715.90),  NECK PAIN (ICD-723.1),  BACK PAIN (ICD-724.5) >>  She has a severe prob w/ neck pain and LBP... s/p MRI scans and prev Lumbar Lam in 2006 by DrPool... f/u myelogram and CT w/ bone spurs in neck and arthritis in her lower back... s/p ant cerv discectomy & fusion C6-7 for stenosis w/ myelopathy per DrPool... prev followed by DrBarker in Pain Management but now relying on DrAnderson & DrPool... she also sees DrKuzma for Ortho w/ severe DJD of hands, Rx w/ Dosepak and Voltaren- min benefit & he has rec surgery but she is holding off & seeing DrAnderson for Rheum as noted... ~  9/11:  she notes now on disability per [facility] who continues to follow her regularly. ~  9/12:  She maintains f/u w/ [facility] Pellant for Rheum & DrPool for Neurosurg; she indicates recent MRI spine done, we don't have recent notes...  VITAMIN D DEFICIENCY (ICD-268.9) - Vit D level 8/09 was 14 & she was started on 50,000 u caps weekly, then switched to 5000 u daily on her own...   ~  labs 9/10 showed Vit D level = 35... OK to continue the 5000 u daily for now. ~  labs 9/11 showed Vit D level = 39... rec to continue 5000 u daily...  ANXIETY DISORDER (ICD-300.00) - prev seen by psyche DrLisaPoulus & was on Celexa & Prosom... not currently on medications for nerves... ~  3/12:  We tried to add KLONOPIN 0.5mg  Bid but she never took it...  Health Maintenance - GYN= DrGottsegen w/ f/u PAP & Mammogram due now... she had prev BMD ?where... she was laid off work at [employer] where she had  been for 33yrs, & worked for [employer] in [employer] for 81yrs, now on disability due to her arthritis...   No past surgical history on file.   Outpatient Encounter Prescriptions as of 07/02/2011 >> DIDN'T BRING MEDS TO OFFICE  Medication Sig Dispense Refill  . calcium-vitamin D (OSCAL WITH D) 500-200 MG-UNIT  per tablet Take 1 tablet by mouth daily.        . Cholecalciferol (VITAMIN D3) 5000 UNITS TABS Take 5,000 Units by mouth daily.        . fish oil-omega-3 fatty acids 1000 MG capsule Take 1 g by mouth daily.        . naproxen sodium (ANAPROX) 220 MG tablet Take 220 mg by mouth as needed.        . pravastatin (PRAVACHOL) 20 MG tablet Take 20 mg by mouth daily.        . clonazePAM (KLONOPIN) 0.5 MG tablet Take 0.5 mg by mouth 2 (two) times daily as needed.    ==> she never took this medication      Allergies  Allergen Reactions  . Atorvastatin     REACTION: causes leg pain    Current Medications, Allergies, Past Medical History, Past Surgical History, Family History, and Social History were reviewed in [employer] record.   Review of Systems         See HPI - all other systems neg except as noted... The patient complains of difficulty walking.  The patient denies anorexia, fever, weight loss, weight gain, vision loss, decreased hearing, hoarseness, chest pain, syncope, dyspnea on exertion, peripheral edema, prolonged cough, headaches, hemoptysis, abdominal pain, melena, hematochezia, severe indigestion/heartburn, hematuria, incontinence, muscle weakness, suspicious skin lesions, transient blindness, depression, unusual weight change, abnormal bleeding, enlarged lymph nodes, and angioedema.     Objective:   Physical Exam     WD, WN, 60 y/o WF in NAD... GENERAL:  Alert & oriented; pleasant & cooperative... HEENT:  [place]/AT, EOM-wnl, PERRLA, EACs-clear, TMs-wnl, NOSE-clear, THROAT-clear & wnl. NECK:  Supple w/ decrROM; scar of ant cerv discectomy; no JVD;  normal carotid impulses w/o bruits;  no thyromegaly or nodules palpated; no lymphadenopathy. CHEST:  Clear to P & A; without wheezes/ rales/ or rhonchi. HEART:  Regular Rhythm; without murmurs/ rubs/ or gallops. ABDOMEN:  Soft & nontender; normal bowel sounds; no organomegaly or masses detected. EXT: +heberdens & bouchards nodes;  no varicose veins/ venous insuffic/ or edema. NEURO:  CN's intact; motor testing normal; sensory testing normal; gait normal & balance OK. DERM:  No lesions noted; no rash etc...   Assessment & Plan:   MVP>  She notes occas self limited palpit on & off; denies CP/ angina etc; recall hx mother died suddenly in her 46's w/ MI; we reviewed risk factor reduction strategy...     Chol>  On Prav20 per LipidClinic w/ FLPs as noted; repeat FLP not as good today w/ LDL up to 184 & she is encouraged to incr Pravachol to 40 & f/u LC; she is also taking Fish Oil & Flax Seed Oil per her preference...     IBS>  She denies symptoms; states she's doing satis w/o GI issues at present...     DJD/ neck pain, back pain>  She continues her f/u w/ DrAnderson for Rheum & DrPool for NS; they manage her chronic pain as well & just on Naprosyn 220mg  Bid as needed now; plus her Oscal, etc...    Vit D defic>  On Vit D 5000u daily supplement; last Vit D level was 32 in 3/12 & rec to continue same for now...     Anxiety>  We gave her Klonopin 0.5mg Bid last OV but she ca't remember taking it, ?if it helped, not taking now; pt still anxious & stressed- declines med Rx.Marland KitchenMarland Kitchen

## 2011-07-02 NOTE — Patient Instructions (Signed)
Today we updated your med list in EPIC...    Continue your current meds the same...  Today we did your follow up fasting blood work...    Please call the PHONE TREE in a few days for your results...    Dial [id number] & when prompted enter your patient number followed by the # symbol...    Your patient number is:  [id number]#  Call for any questions...    Continue the great job w/ diet & exercise...  Let's plan a follow up visit in 6 months.Marland KitchenMarland Kitchen

## 2011-07-03 ENCOUNTER — Encounter: Payer: Self-pay | Admitting: Pulmonary Disease

## 2011-07-05 ENCOUNTER — Other Ambulatory Visit: Payer: Self-pay | Admitting: *Deleted

## 2011-07-05 MED ORDER — PRAVASTATIN SODIUM 40 MG PO TABS: 40.0000 mg | ORAL_TABLET | Freq: Every day | ORAL | Status: DC

## 2011-08-10 ENCOUNTER — Other Ambulatory Visit: Payer: Self-pay | Admitting: Obstetrics and Gynecology

## 2011-08-10 DIAGNOSIS — Z1231 Encounter for screening mammogram for malignant neoplasm of breast: Secondary | ICD-10-CM

## 2011-08-19 ENCOUNTER — Other Ambulatory Visit: Payer: Self-pay | Admitting: Pulmonary Disease

## 2011-08-19 ENCOUNTER — Ambulatory Visit (INDEPENDENT_AMBULATORY_CARE_PROVIDER_SITE_OTHER): Payer: BC Managed Care – PPO | Admitting: Pulmonary Disease

## 2011-08-19 ENCOUNTER — Encounter: Payer: Self-pay | Admitting: Pulmonary Disease

## 2011-08-19 ENCOUNTER — Other Ambulatory Visit (INDEPENDENT_AMBULATORY_CARE_PROVIDER_SITE_OTHER): Payer: BC Managed Care – PPO

## 2011-08-19 VITALS — BP 132/82 | HR 73 | Temp 97.0°F | Ht 64.0 in | Wt 152.0 lb

## 2011-08-19 DIAGNOSIS — R0600 Dyspnea, unspecified: Secondary | ICD-10-CM

## 2011-08-19 DIAGNOSIS — R0609 Other forms of dyspnea: Secondary | ICD-10-CM

## 2011-08-19 DIAGNOSIS — E7800 Pure hypercholesterolemia, unspecified: Secondary | ICD-10-CM

## 2011-08-19 DIAGNOSIS — F411 Generalized anxiety disorder: Secondary | ICD-10-CM

## 2011-08-19 DIAGNOSIS — M1990 Unspecified osteoarthritis, unspecified site: Secondary | ICD-10-CM

## 2011-08-19 DIAGNOSIS — R0989 Other specified symptoms and signs involving the circulatory and respiratory systems: Secondary | ICD-10-CM

## 2011-08-19 DIAGNOSIS — M549 Dorsalgia, unspecified: Secondary | ICD-10-CM

## 2011-08-19 DIAGNOSIS — M79609 Pain in unspecified limb: Secondary | ICD-10-CM

## 2011-08-19 DIAGNOSIS — M79673 Pain in unspecified foot: Secondary | ICD-10-CM

## 2011-08-19 DIAGNOSIS — R002 Palpitations: Secondary | ICD-10-CM

## 2011-08-19 LAB — HEPATIC FUNCTION PANEL
Albumin: 4 g/dL (ref 3.5–5.2)
Total Protein: 7 g/dL (ref 6.0–8.3)

## 2011-08-19 LAB — LIPID PANEL
Cholesterol: 210 mg/dL — ABNORMAL HIGH (ref 0–200)
HDL: 57 mg/dL (ref >39.00)
VLDL: 27.2 mg/dL (ref 0.0–40.0)

## 2011-08-19 MED ORDER — CLONAZEPAM 0.5 MG PO TABS: 0.5000 mg | ORAL_TABLET | Freq: Two times a day (BID) | ORAL | Status: DC | PRN

## 2011-08-19 NOTE — Progress Notes (Signed)
Subjective:    Patient ID: [name] [name], female    DOB: [birth year], 60 y.o.   MRN: [id number]  HPI 60 y/o WF here for a follow up visit... she has multiple medical problems including severe Hypercholesterolemia prev followed in the [facility];  severe DJD/ Neck pain/ LBP followed by [facility] for Rheum & DrPool for Neurosurg (they do her pain management as well);  Anxiety prev eval by DrPoulas...   ~  June 23, 2010:  she is now on disability due the her arthritis per [facility]- still sees him & DrPool for NS due to prev neck & lower back surg... on pain med per [facility] (she doesn't know the name & didn't bring bottles)- this is her only perscription drug... she denies CP, palpit, SOB, edema;  she stopped Lipitor ~58mo ago to to leg cramps;  denies GI or GU symptoms (sees DrGottsegen for GYN);  due for CXR, EKG, fasting blood work (TChol=334, LDL=238, sent to the [facility])...  ~  December 09, 2010:  She has mult somatic complaints> tired, sleepy, CP, SOB, anxiety;  the chest discomfort sound like CWP & the SOB is a sensation like she can't get a deep breath;  under alot of stress she says & she thinks this affects her breathing;  not resting well & husb says she snores- we discussed sleep study to r/o OSA & in the meanwhile we will rx w/ KLONOPIN 0.5mg  Bid...    She is now followed in the [facility] for her severe hypercholesterolemia> intol to Lipitor w/ aching & now on Prav20 + Fish Oil w/ some improvement in her numbers.    She continues to f/u w/ DrAnderson & DrPool for her DJD, neck & back pain> currently only using Aleve Prn...    Labs today looked good> CBC, Fe, TSH- all normal; Chems OK x BS=117;  Vit D = 32 on her 5000 u daily supplement (continue same).  ~  July 03, 2011:  41mo ROV & she is feeling well, just ret from an [place] cruise; she's been exercising on a treadmill she says & thinks it's helping; she did not bring med bottles or med list to OV today...    MVP>  She notes occas  self limited palpit on & off; denies CP/ angina etc; recall hx mother died suddenly in her 32's w/ MI; we reviewed risk factor reduction strategy...    Chol>  On Prav20 per [facility] w/ FLPs recorded below; repeat FLP not as good today w/ LDL up to 184 & she is encouraged to incr Pravachol to 40 & f/u LC; she is also taking Fish Oil & Flax Seed Oil per her preference...    IBS>  She denies abd pain, n/v/d/c/blood in stools etc; states she's doing satis w/o GI issues at present...    DJD/ neck pain, back pain>  She continues her f/u w/ DrAnderson for Rheum & DrPool for NS; they manage her chronic pain as well & just on Naprosyn 220mg  Bid as needed now; plus her Oscal, etc...    Vit D defic>  On Vit D 5000u daily supplement; last Vit D level was 32 in 3/12 & rec to continue same for now...    Anxiety>  We gave her Klonopin 0.5mg Bid last OV but she ca't remember taking it, ?if it helped, not taking now; pt still anxious & stressed- declines med Rx...  ~  August 19, 2011:  6wk ROV and add-on for 3 problems: 1) she's noted incr  palpit & SOB since she's stopped her Klonopin; palpit are like a racing that occurs on & off esp if anxious or at bedtime when quiet; also notes the SOB- "like I can't get a deep breath" at night while quiet & trying to sleep; we discussed resuming the KLONOPIN 0.5mg  Bid regularly, avoid caffeine etc...  2) c/o heel pain esp on left foot, c/w achilles tendonitis; rec hot soaks, & NSAID Rx w/ Advil/ Aleve/ etc, & stretching exercise; next step = Podiatry if nec.Marland KitchenMarland Kitchen  3) wants f/u FLP now on Prav40 x 6wks (improved- not yet to goal)...  See prob list below:          Problem List:  CHEST WALL PAIN, HX OF (ICD-V15.89),  MITRAL VALVE PROLAPSE, HX OF (ICD-V12.50) -  prev cardiac eval by DrNishan in 2005 due to coronary risk factors: pt's mother died suddenly in her 53's w/ MI... denies CP, palipit, dizziness, syncope, dyspnea, edema, etc...  ~  2DEcho 12/05 showed normal x ?HK  inferiorly, EF= 55-60%... ~  NuclearStressTest 12/05 showed normal- no ischemia, no scar, EF= 65%... no change from 2003. ~  EKG= NSR, WNL.[name]. ~  CXR 9/10 & 9/11 showed NAD...  HYPERCHOLESTEROLEMIA (ICD-272.0) - prev followed in the lipid clinic... she was on Lip20 but stopped ~6/11 due to leg cramps. ~  FLP 8/08 showed TChol 224, TG 69, HDL 54, LDL 162... this was off meds- statin restarted. ~  FLP 8/09 on Lip20 showed TChol 195, TG 100, HDL 65, LDL 110... improved- needs better diet. ~  FLP 9/10 on Lip20 showed TChol 198, TG 104, HDL 61, LDL 116... she stopped ~6/11 due to leg cramps. ~  FLP 9/11 on diet alone showed TChol 334, TG 170, HDL 60, LDL 238... needs ret to [facility]. ~  FLP 11/11 on Prav20 showed TChol 212, TG 111, HDL 54, LDL 129 ~  FLP 3/12 on Prav20 showed TChol 222, TG 126, HDL 57, LDL 153 ~  FLP 9/12 on Prav20 showed TChol 266, TG 138, HDL 62, LDL 184... Needs to incr Prav40 & f/u LC. ~  FLP 11/12 on Prav40 showed TChol 210, TG 136, HDL 57, LDL 141... She tells me that [facility] told her to f/u here; Continue Prav40 & diet Rx...  IRRITABLE BOWEL SYNDROME, HX OF (ICD-V12.79) - last colonoscopy 7/05 by DrStark was WNL- no divertics, no polyps, etc... f/u planned 82yrs...  DEGENERATIVE JOINT DISEASE (ICD-715.90),  NECK PAIN (ICD-723.1),  BACK PAIN (ICD-724.5) >>  She has a severe prob w/ neck pain and LBP... s/p MRI scans and prev Lumbar Lam in 2006 by DrPool... f/u myelogram and CT w/ bone spurs in neck and arthritis in her lower back... s/p ant cerv discectomy & fusion C6-7 for stenosis w/ myelopathy per DrPool... prev followed by DrBarker in Pain Management but now relying on DrAnderson & DrPool... she also sees DrKuzma for Ortho w/ severe DJD of hands, Rx w/ Dosepak and Voltaren- min benefit & he has rec surgery but she is holding off & seeing DrAnderson for Rheum as noted... ~  9/11:  she notes now on disability per [facility] who continues to follow her regularly. ~  9/12:  She  maintains f/u w/ [facility] for Rheum & DrPool for Neurosurg; she indicates recent MRI spine done, we don't have recent notes...  VITAMIN D DEFICIENCY (ICD-268.9) - Vit D level 8/09 was 14 & she was started on 50,000 u caps weekly, then switched to 5000 u daily on her own...   ~  labs 9/10 showed Vit D level = 35... OK to continue the 5000 u daily for now. ~  labs 9/11 showed Vit D level = 39... rec to continue 5000 u daily...  ANXIETY DISORDER (ICD-300.00) - prev seen by psyche DrLisaPoulus & was on Celexa & Prosom... not currently on medications for nerves... ~  3/12:  We tried to add KLONOPIN 0.5mg  Bid but she never took it... ~  11/12:  Pt c/o incr palpit esp when quiet at night; rec to restart KLONOPIN 0.5mg  Bid regularly...  Health Maintenance - GYN= DrGottsegen w/ f/u PAP & Mammogram due now... she had prev BMD ?where... she was laid off work at [employer] where she had been for 62yrs, & worked for [employer] in [employer] for 36yrs, now on disability due to her arthritis...   No past surgical history on file.   Outpatient Encounter Prescriptions as of 08/19/2011  Medication Sig Dispense Refill  . calcium-vitamin D (OSCAL WITH D) 500-200 MG-UNIT per tablet Take 1 tablet by mouth daily.        . Cholecalciferol (VITAMIN D3) 5000 UNITS TABS Take 5,000 Units by mouth daily.        . fish oil-omega-3 fatty acids 1000 MG capsule Take 1 g by mouth daily.        . naproxen sodium (ANAPROX) 220 MG tablet Take 220 mg by mouth as needed.        . pravastatin (PRAVACHOL) 40 MG tablet Take 1 tablet (40 mg total) by mouth daily.  90 tablet  3  . clonazePAM (KLONOPIN) 0.5 MG tablet Take 0.5 mg by mouth 2 (two) times daily as needed.          Allergies  Allergen Reactions  . Atorvastatin     REACTION: causes leg pain    Current Medications, Allergies, Past Medical History, Past Surgical History, Family History, and Social History were reviewed in [employer]  record.   Review of Systems         See HPI - all other systems neg except as noted... The patient complains of difficulty walking.  The patient denies anorexia, fever, weight loss, weight gain, vision loss, decreased hearing, hoarseness, chest pain, syncope, dyspnea on exertion, peripheral edema, prolonged cough, headaches, hemoptysis, abdominal pain, melena, hematochezia, severe indigestion/heartburn, hematuria, incontinence, muscle weakness, suspicious skin lesions, transient blindness, depression, unusual weight change, abnormal bleeding, enlarged lymph nodes, and angioedema.     Objective:   Physical Exam     WD, WN, 60 y/o WF in NAD... GENERAL:  Alert & oriented; pleasant & cooperative... HEENT:  [place]/AT, EOM-wnl, PERRLA, EACs-clear, TMs-wnl, NOSE-clear, THROAT-clear & wnl. NECK:  Supple w/ decrROM; scar of ant cerv discectomy; no JVD; normal carotid impulses w/o bruits;  no thyromegaly or nodules palpated; no lymphadenopathy. CHEST:  Clear to P & A; without wheezes/ rales/ or rhonchi. HEART:  Regular Rhythm; without murmurs/ rubs/ or gallops. ABDOMEN:  Soft & nontender; normal bowel sounds; no organomegaly or masses detected. EXT: +heberdens & bouchards nodes;  no varicose veins/ venous insuffic/ or edema. NEURO:  CN's intact; motor testing normal; sensory testing normal; gait normal & balance OK. DERM:  No lesions noted; no rash etc...   Assessment & Plan:   MVP/ Palpit>  She notes occas self limited palpit on & off; denies CP/ angina etc; recall hx mother died suddenly in her 52's w/ MI; we reviewed risk factor reduction strategy...     Chol>  On Prav20 per [facility] w/ FLPs  as noted; repeat FLP not as good today w/ LDL up to 184 & she is encouraged to incr Pravachol to 40 & f/u LC; she is also taking Fish Oil & Flax Seed Oil per her preference...     IBS>  She denies symptoms; states she's doing satis w/o GI issues at present...     DJD/ neck pain, back pain>  She continues  her f/u w/ DrAnderson for Rheum & DrPool for NS; they manage her chronic pain as well & just on Naprosyn 220mg  Bid as needed now; plus her Oscal, etc...    Vit D defic>  On Vit D 5000u daily supplement; last Vit D level was 32 in 3/12 & rec to continue same for now...     Anxiety>  We gave her Klonopin 0.5mg Bid last OV but she ca't remember taking it, ?if it helped, not taking now; pt still anxious & stressed- declines med Rx.Marland KitchenMarland Kitchen

## 2011-08-19 NOTE — Patient Instructions (Signed)
Today we updated your med list in our EPIC system...    Continue your current medications the same...    We refilled/ restarted your KLONOPIN 0.5mg  twice daily to help w/ the palpitations & shortness of breath...  For your HEEL pain:    Do some hot soaks as needed...    Do the stretching exercise we demonstrated for you several times per day...    Take the Naprosyn, Aleve, Advil, etc as needed...  Today we did your follow up fasting blood work...    Please call the [facility] in a few days for your results...    Dial [id number] & when prompted enter your patient number followed by the # symbol...    Your patient number is:  [id number]  Call for any problems.Marland KitchenMarland Kitchen

## 2011-08-31 DIAGNOSIS — M069 Rheumatoid arthritis, unspecified: Secondary | ICD-10-CM | POA: Insufficient documentation

## 2011-08-31 DIAGNOSIS — N840 Polyp of corpus uteri: Secondary | ICD-10-CM | POA: Insufficient documentation

## 2011-08-31 DIAGNOSIS — N814 Uterovaginal prolapse, unspecified: Secondary | ICD-10-CM | POA: Insufficient documentation

## 2011-09-01 ENCOUNTER — Other Ambulatory Visit: Payer: BC Managed Care – PPO | Admitting: *Deleted

## 2011-09-02 ENCOUNTER — Ambulatory Visit: Payer: BC Managed Care – PPO

## 2011-09-07 ENCOUNTER — Ambulatory Visit (INDEPENDENT_AMBULATORY_CARE_PROVIDER_SITE_OTHER): Payer: BC Managed Care – PPO | Admitting: Obstetrics and Gynecology

## 2011-09-07 ENCOUNTER — Other Ambulatory Visit (HOSPITAL_COMMUNITY)
Admission: RE | Admit: 2011-09-07 | Discharge: 2011-09-07 | Disposition: A | Discharge status: Home / Self Care | Payer: BC Managed Care – PPO | Source: Ambulatory Visit | Attending: Obstetrics and Gynecology | Admitting: Obstetrics and Gynecology

## 2011-09-07 ENCOUNTER — Encounter: Payer: Self-pay | Admitting: Obstetrics and Gynecology

## 2011-09-07 VITALS — BP 124/66 | Ht 65.0 in | Wt 153.0 lb

## 2011-09-07 DIAGNOSIS — Z01419 Encounter for gynecological examination (general) (routine) without abnormal findings: Secondary | ICD-10-CM

## 2011-09-07 DIAGNOSIS — R82998 Other abnormal findings in urine: Secondary | ICD-10-CM

## 2011-09-07 DIAGNOSIS — N814 Uterovaginal prolapse, unspecified: Secondary | ICD-10-CM

## 2011-09-07 DIAGNOSIS — N8111 Cystocele, midline: Secondary | ICD-10-CM

## 2011-09-07 DIAGNOSIS — IMO0002 Reserved for concepts with insufficient information to code with codable children: Secondary | ICD-10-CM

## 2011-09-07 NOTE — Progress Notes (Signed)
Patient came to see me today for her annual GYN exam. She is doing well without menopausal symptoms. She has a history of urinary tract infections but is currently asymptomatic. She is scheduled her mammogram for today. She does her lab through her PCP. Her last bone density was normal. She has symptomatic pelvic relaxation but is not symptomatic enough to require surgery. She is having no vaginal bleeding or pelvic pain. She had an abnormal urinalysis today.  Physical examination:  [name] [name] present HEENT within normal limits. Neck: Thyroid not large. No masses. Supraclavicular nodes: not enlarged. Breasts: Examined in both sitting midline position. No skin changes and no masses. Abdomen: Soft no guarding rebound or masses or hernia. Pelvic: External: Within normal limits. BUS: Within normal limits. Vaginal:within normal limits. Good estrogen effect. First-degree cystocele. Second-degree rectocele. Cervix: clean. Uterus: Normal size and shapewith first degree uterine prolapse. Adnexa: No masses. Rectovaginal exam: Confirmatory and negative. Extremities: Within normal limits.  Assessment: #1. Uterine prolapse #2. Cystocele #3. Rectocele #4. Possible UTI  Plan: Urine culture. Mammogram. Observation of pelvic relaxation.

## 2011-09-08 ENCOUNTER — Telehealth: Payer: Self-pay | Admitting: *Deleted

## 2011-09-08 NOTE — Telephone Encounter (Signed)
Patient called to ask the results of her urine culture done yesterday.  Informed results negative.

## 2011-09-09 ENCOUNTER — Ambulatory Visit: Payer: BC Managed Care – PPO

## 2011-09-12 ENCOUNTER — Encounter: Payer: Self-pay | Admitting: Pulmonary Disease

## 2011-09-15 ENCOUNTER — Ambulatory Visit
Admission: RE | Admit: 2011-09-15 | Discharge: 2011-09-15 | Disposition: A | Discharge status: Home / Self Care | Payer: BC Managed Care – PPO | Source: Ambulatory Visit | Attending: Obstetrics and Gynecology | Admitting: Obstetrics and Gynecology

## 2011-09-15 DIAGNOSIS — Z1231 Encounter for screening mammogram for malignant neoplasm of breast: Secondary | ICD-10-CM

## 2011-12-24 ENCOUNTER — Other Ambulatory Visit: Payer: Self-pay | Admitting: Pharmacist

## 2011-12-24 DIAGNOSIS — E785 Hyperlipidemia, unspecified: Secondary | ICD-10-CM

## 2011-12-30 ENCOUNTER — Ambulatory Visit (INDEPENDENT_AMBULATORY_CARE_PROVIDER_SITE_OTHER): Payer: BC Managed Care – PPO | Admitting: Pulmonary Disease

## 2011-12-30 ENCOUNTER — Other Ambulatory Visit (INDEPENDENT_AMBULATORY_CARE_PROVIDER_SITE_OTHER): Payer: BC Managed Care – PPO

## 2011-12-30 ENCOUNTER — Ambulatory Visit (INDEPENDENT_AMBULATORY_CARE_PROVIDER_SITE_OTHER)
Admission: RE | Admit: 2011-12-30 | Discharge: 2011-12-30 | Disposition: A | Discharge status: Home / Self Care | Payer: BC Managed Care – PPO | Source: Ambulatory Visit | Attending: Pulmonary Disease | Admitting: Pulmonary Disease

## 2011-12-30 ENCOUNTER — Encounter: Payer: Self-pay | Admitting: Pulmonary Disease

## 2011-12-30 VITALS — BP 116/78 | HR 65 | Temp 96.8°F | Ht 64.0 in | Wt 152.4 lb

## 2011-12-30 DIAGNOSIS — Z8679 Personal history of other diseases of the circulatory system: Secondary | ICD-10-CM

## 2011-12-30 DIAGNOSIS — F411 Generalized anxiety disorder: Secondary | ICD-10-CM

## 2011-12-30 DIAGNOSIS — E785 Hyperlipidemia, unspecified: Secondary | ICD-10-CM

## 2011-12-30 DIAGNOSIS — Z87448 Personal history of other diseases of urinary system: Secondary | ICD-10-CM

## 2011-12-30 DIAGNOSIS — M1990 Unspecified osteoarthritis, unspecified site: Secondary | ICD-10-CM

## 2011-12-30 DIAGNOSIS — E559 Vitamin D deficiency, unspecified: Secondary | ICD-10-CM

## 2011-12-30 DIAGNOSIS — M79674 Pain in right toe(s): Secondary | ICD-10-CM

## 2011-12-30 DIAGNOSIS — E7800 Pure hypercholesterolemia, unspecified: Secondary | ICD-10-CM

## 2011-12-30 DIAGNOSIS — M542 Cervicalgia: Secondary | ICD-10-CM

## 2011-12-30 DIAGNOSIS — M549 Dorsalgia, unspecified: Secondary | ICD-10-CM

## 2011-12-30 LAB — BASIC METABOLIC PANEL
Calcium: 9.5 mg/dL (ref 8.4–10.5)
Creatinine, Ser: 0.8 mg/dL (ref 0.4–1.2)
GFR: 74.01 mL/min (ref >60.00)
Glucose, Bld: 87 mg/dL (ref 70–99)
Sodium: 139 mEq/L (ref 135–145)

## 2011-12-30 LAB — LIPID PANEL
Cholesterol: 217 mg/dL — ABNORMAL HIGH (ref 0–200)
Triglycerides: 143 mg/dL (ref 0.0–149.0)

## 2011-12-30 LAB — URINALYSIS, ROUTINE W REFLEX MICROSCOPIC
Bilirubin Urine: NEGATIVE
Ketones, ur: NEGATIVE
Urine Glucose: NEGATIVE
Urobilinogen, UA: 0.2 (ref 0.0–1.0)

## 2011-12-30 LAB — LDL CHOLESTEROL, DIRECT: Direct LDL: 143.5 mg/dL

## 2011-12-30 LAB — CBC WITH DIFFERENTIAL/PLATELET
Basophils Absolute: 0 10*3/uL (ref 0.0–0.1)
Hemoglobin: 15.1 g/dL — ABNORMAL HIGH (ref 12.0–15.0)
Lymphocytes Relative: 21 % (ref 12.0–46.0)
Monocytes Relative: 9.5 % (ref 3.0–12.0)
Neutro Abs: 4.3 10*3/uL (ref 1.4–7.7)
Neutrophils Relative %: 68.3 % (ref 43.0–77.0)
Platelets: 271 10*3/uL (ref 150.0–400.0)
RDW: 13.2 % (ref 11.5–14.6)

## 2011-12-30 LAB — HEPATIC FUNCTION PANEL
ALT: 30 U/L (ref 0–35)
AST: 23 U/L (ref 0–37)
Albumin: 4.4 g/dL (ref 3.5–5.2)
Total Bilirubin: 0.8 mg/dL (ref 0.3–1.2)

## 2011-12-30 LAB — TSH: TSH: 1.77 u[IU]/mL (ref 0.35–5.50)

## 2011-12-30 NOTE — Progress Notes (Addendum)
Subjective:    Patient ID: [name] [name], female    DOB: [birth year], 61 y.o.   MRN: [id number]  HPI 61 y/o WF here for a follow up visit... she has multiple medical problems including severe Hypercholesterolemia prev followed in the [facility];  severe DJD/ Neck pain/ LBP followed by [name] [name] for Rheum & [name] [name] for Neurosurg (they do her pain management as well);  Anxiety prev eval by [name] [name]...   ~  June 23, 2010:  she is now on disability due the her arthritis per [name] [name]- still sees him & [name] [name] for NS due to prev neck & lower back surg... on pain med per [name] [name] (she doesn't know the name & didn't bring bottles)- this is her only perscription drug... she denies CP, palpit, SOB, edema;  she stopped Lipitor ~24mo ago to to leg cramps;  denies GI or GU symptoms (sees [name] [name] for GYN);  due for CXR, EKG, fasting blood work (TChol=334, LDL=238, sent to the [facility])...  ~  December 09, 2010:  She has mult somatic complaints> tired, sleepy, CP, SOB, anxiety;  the chest discomfort sound like CWP & the SOB is a sensation like she can't get a deep breath;  under alot of stress she says & she thinks this affects her breathing;  not resting well & husb says she snores- we discussed sleep study to r/o OSA & in the meanwhile we will rx w/ KLONOPIN 0.5mg  Bid...    She is now followed in the [facility]id Clinic for her severe hypercholesterolemia> intol to Lipitor w/ aching & now on Prav20 + Fish Oil w/ some improvement in her numbers.    She continues to f/u w/ [name] [name] & [name] [name] for her DJD, neck & back pain> currently only using Aleve Prn...    Labs today looked good> CBC, Fe, TSH- all normal; Chems OK x BS=117;  Vit D = 32 on her 5000 u daily supplement (continue same).  ~  July 03, 2011:  27mo ROV & she is feeling well, just ret from an [place] cruise; she's been exercising on a treadmill she says & thinks it's helping; she did not bring med bottles or med list to OV today...    MVP>  She notes occas  self limited palpit on & off; denies CP/ angina etc; recall hx mother died suddenly in her 50's w/ MI; we reviewed risk factor reduction strategy...    Chol>  On Prav20 per [facility] w/ FLPs recorded below; repeat FLP not as good today w/ LDL up to 184 & she is encouraged to incr Pravachol to 40 & f/u LC; she is also taking Fish Oil & Flax Seed Oil per her preference...    IBS>  She denies abd pain, n/v/d/c/blood in stools etc; states she's doing satis w/o GI issues at present...    DJD/ neck pain, back pain>  She continues her f/u w/ [name] [name] for Rheum & [name] [name] for NS; they manage her chronic pain as well & just on Naprosyn 220mg  Bid as needed now; plus her Oscal, etc...    Vit D defic>  On Vit D 5000u daily supplement; last Vit D level was 32 in 3/12 & rec to continue same for now...    Anxiety>  We gave her Klonopin 0.5mg Bid last OV but she ca't remember taking it, ?if it helped, not taking now; pt still anxious & stressed- declines med Rx...  ~  August 19, 2011:  6wk ROV and add-on for 3 problems: 1) she's noted incr  palpit & SOB since she's stopped her Klonopin; palpit are like a racing that occurs on & off esp if anxious or at bedtime when quiet; also notes the SOB- "like I can't get a deep breath" at night while quiet & trying to sleep; we discussed resuming the KLONOPIN 0.5mg  Bid regularly, avoid caffeine etc...  2) c/o heel pain esp on left foot, c/w achilles tendonitis; rec hot soaks, & NSAID Rx w/ Advil/ Aleve/ etc, & stretching exercise; next step = Podiatry if nec.Marland KitchenMarland Kitchen  3) wants f/u FLP now on Prav40 x 6wks (improved- not yet to goal)...  See prob list below:  ~  December 29, 2101:  4-85mo ROV & she appears quite anxious, rapid speech & somewhat loud; encouraged to take the Klonopin Bid regularly; she is concerned about her great toe nail- mycotic infection & offered Lamisil but she thinks it needs to come off & we will refer to Podiatry;  Also notes freq urination & wants Urinalysis & cult  today...     MVP>  She notes occas self limited palpit on & off; denies CP/ angina etc; recall hx mother died suddenly in her 74's w/ MI; we gave her a lot of reassurance & reviewed risk factor reduction strategy; avoid caffeine & take the Klonopin!    Chol>  On Prav40 now & followed in the [facility]; FLP stable on this w/ [id number] & [id number]; she is also taking Fish Oil & Flax Seed Oil per her preference; I told her she may need a stonger statin if Prav40 + DIET don't get her closer to the goals...    IBS>  She denies abd pain, n/v/d/c/blood in stools etc; states she's doing satis w/o GI issues at present...    DJD/ neck pain, back pain>  She continues her f/u w/ [name] [name] for Rheum & [name] [name] for NS; they manage her chronic pain as well & just on Naprosyn 220mg  Bid as needed now; vs OTC analgesics, plus her Oscal, etc...    Vit D defic>  On Vit D 5000u daily supplement but recently ran out; Vit D level is still on the low side at 35 today- rec continuing VitD 5000u OTC daily supplement...    Anxiety>  She still appears quite anxious; supposed to be taking Klonopin 0.5mg  Bid regularly for the palpit/ SOB/ etc; asked her to take Bid regularly so we could assess it's efficacy & see what her best dose will be...    DERM> onychomycosis would benefit from Lamisil in my opinion but she wonders if the nail needs to [facility]; we will send her to Podiatry for their review...  CXR 3/13 showed stable heart size, clear lungs, DJD in spine w/ neck hardware... LABS 3/13 showed:  FLP- not at goals on Prav40;  Chems- wnl;  CBC- wnl;  TSH=1.77;  VitD=35;  UA- clear x few wbc & awaiting C&S...          Problem List:  CHEST WALL PAIN, HX OF (ICD-V15.89),  MITRAL VALVE PROLAPSE, HX OF (ICD-V12.50) -  prev cardiac eval by [name] [name] in 2005 due to coronary risk factors: pt's mother died suddenly in her 67's w/ MI... denies CP, palipit, dizziness, syncope, dyspnea, edema, etc...  ~  2DEcho 12/05 showed normal x ?HK inferiorly, EF=  55-60%... ~  NuclearStressTest 12/05 showed normal- no ischemia, no scar, EF= 65%... no change from 2003. ~  EKG= NSR, WNL.[name]. ~  CXR 9/10 & 9/11 showed NAD.[name]. ~  CXR 3/13 showed stable heart size, clear  lungs, DJD in spine w/ neck hardware...   HYPERCHOLESTEROLEMIA (ICD-272.0) - prev followed in the lipid clinic... she was on Lip20 but stopped ~6/11 due to leg cramps. ~  FLP 8/08 showed TChol 224, TG 69, HDL 54, LDL 162... this was off meds- statin restarted. ~  FLP 8/09 on Lip20 showed TChol 195, TG 100, HDL 65, LDL 110... improved- needs better diet. ~  FLP 9/10 on Lip20 showed TChol 198, TG 104, HDL 61, LDL 116... she stopped ~6/11 due to leg cramps. ~  FLP 9/11 on diet alone showed TChol 334, TG 170, HDL 60, LDL 238... needs ret to [facility]. ~  FLP 11/11 on Prav20 showed TChol 212, TG 111, HDL 54, LDL 129 ~  FLP 3/12 on Prav20 showed TChol 222, TG 126, HDL 57, LDL 153 ~  FLP 9/12 on Prav20 showed TChol 266, TG 138, HDL 62, LDL 184... Needs to incr Prav40 & f/u LC. ~  FLP 11/12 on Prav40 showed TChol 210, TG 136, HDL 57, LDL 141... She tells me that the [facility] told her to f/u here; Continue Prav40 & diet Rx. ~  FLP 3/13 on Prav40 showed TChol 217, TG 143, HDL 62, LDL 143... Told she may need stronger statin if Prav40 + diet don't work better...  IRRITABLE BOWEL SYNDROME, HX OF (ICD-V12.79) - last colonoscopy 7/05 by DrStark was WNL- no divertics, no polyps, etc... f/u planned 62yrs...  DEGENERATIVE JOINT DISEASE (ICD-715.90),  NECK PAIN (ICD-723.1),  BACK PAIN (ICD-724.5) >>  She has a severe prob w/ neck pain and LBP... s/p MRI scans and prev Lumbar Lam in 2006 by [name] [name]... f/u myelogram and CT w/ bone spurs in neck and arthritis in her lower back... s/p ant cerv discectomy & fusion C6-7 for stenosis w/ myelopathy per [name] [name]... prev followed by DrBarker in Pain Management but now relying on [name] [name] & [name] [name]... she also sees DrKuzma for Ortho w/ severe DJD of hands, Rx w/ Dosepak and  Voltaren- min benefit & he has rec surgery but she is holding off & seeing [name] [name] for Rheum as noted... ~  9/11:  she notes now on disability per [facility] who continues to follow her regularly. ~  9/12:  She maintains f/u w/ [name] [name] for Rheum & [name] [name] for Neurosurg; she indicates recent MRI spine done, we don't have recent notes...  VITAMIN D DEFICIENCY (ICD-268.9) - Vit D level 8/09 was 14 & she was started on 50,000 u caps weekly, then switched to 5000 u daily on her own...   ~  labs 9/10 showed Vit D level = 35... OK to continue the 5000 u daily for now. ~  labs 9/11 showed Vit D level = 39... rec to continue 5000 u daily... ~  Labs 3/13 showed Vit D level = 35... States she recently ran out of Vit D; rec to continue 5000u daily...  ANXIETY DISORDER (ICD-300.00) - prev seen by psyche DrLisaPoulus & was on Celexa & Prosom... not currently on medications for nerves... ~  3/12:  We tried to add KLONOPIN 0.5mg  Bid but she never took it... ~  11/12:  Pt c/o incr palpit esp when quiet at night; rec to restart KLONOPIN 0.5mg  Bid regularly... ~  3/13:  rec to take Klonopin 0.5mg  Bid regularly or consider re-establish w/ LisaPoulis...  Health Maintenance - GYN= [name] [name] w/ f/u PAP & Mammogram due now... she had prev BMD ?where... she was laid off work at [employer] where she had been for 55yrs, & worked for [employer]  in [place] for 79yrs, now on disability due to her arthritis...   Past Surgical History  Procedure Date  . Hand surgery 1996  . Tubal ligation 1980  . Hysteroscopy 2004    D&C  . Dilation and curettage of uterus 2004    WITH HYSTEROSCOPY  . Neck surgery 2009    SPURS  . Cyst on neck 2011     Outpatient Encounter Prescriptions as of 12/30/2011  Medication Sig Dispense Refill  . calcium-vitamin D (OSCAL WITH D) 500-200 MG-UNIT per tablet Take 1 tablet by mouth daily.        . clonazePAM (KLONOPIN) 0.5 MG tablet Take 1 tablet (0.5 mg total) by mouth 2 (two) times  daily as needed.  60 tablet  5  . fish oil-omega-3 fatty acids 1000 MG capsule Take 1 g by mouth daily.        . Flaxseed, Linseed, (FLAX SEED OIL) 1000 MG CAPS Take 1 capsule by mouth daily.      . pravastatin (PRAVACHOL) 40 MG tablet Take 1 tablet (40 mg total) by mouth daily.  90 tablet  3  . DISCONTD: Cholecalciferol (VITAMIN D3) 5000 UNITS TABS Take 5,000 Units by mouth daily.        [name] DISCONTD: naproxen sodium (ANAPROX) 220 MG tablet Take 220 mg by mouth as needed.          Allergies  Allergen Reactions  . Atorvastatin     REACTION: causes leg pain  . Macrodantin     Current Medications, Allergies, Past Medical History, Past Surgical History, Family History, and Social History were reviewed in [employer] record.   Review of Systems         See HPI - all other systems neg except as noted... The patient complains of difficulty walking.  The patient denies anorexia, fever, weight loss, weight gain, vision loss, decreased hearing, hoarseness, chest pain, syncope, dyspnea on exertion, peripheral edema, prolonged cough, headaches, hemoptysis, abdominal pain, melena, hematochezia, severe indigestion/heartburn, hematuria, incontinence, muscle weakness, suspicious skin lesions, transient blindness, depression, unusual weight change, abnormal bleeding, enlarged lymph nodes, and angioedema.     Objective:   Physical Exam     WD, WN, 61 y/o WF in NAD... GENERAL:  Alert & oriented; pleasant & cooperative... HEENT:  [place]/AT, EOM-wnl, PERRLA, EACs-clear, TMs-wnl, NOSE-clear, THROAT-clear & wnl. NECK:  Supple w/ decrROM; scar of ant cerv discectomy; no JVD; normal carotid impulses w/o bruits;  no thyromegaly or nodules palpated; no lymphadenopathy. CHEST:  Clear to P & A; without wheezes/ rales/ or rhonchi. HEART:  Regular Rhythm; without murmurs/ rubs/ or gallops. ABDOMEN:  Soft & nontender; normal bowel sounds; no organomegaly or masses detected. EXT: +heberdens &  bouchards nodes;  no varicose veins/ venous insuffic/ or edema. NEURO:  CN's intact; motor testing normal; sensory testing normal; gait normal & balance OK. DERM:  No lesions noted; no rash etc...  RADIOLOGY DATA:  Reviewed in the EPIC EMR & discussed w/ the patient...  LABORATORY DATA:  Reviewed in the EPIC EMR & discussed w/ the patient...   Assessment & Plan:   MVP/ Palpit>  She notes occas self limited palpit on & off; denies CP/ angina etc; recall hx mother died suddenly in her 61's w/ MI; we reviewed risk factor reduction strategy... Rec to take the Klonopin regularly.     Chol>  On Prav40 now & labs stable but not at goals; may need stronger statin if the Prav40 + diet don't work better,  she wants to give it a little longer; she is also taking Fish Oil & Flax Seed Oil per her preference...     IBS>  She denies symptoms; states she's doing satis w/o GI issues at present...     DJD/ neck pain, back pain>  She continues her f/u w/ [name] [name] for Rheum & [name] [name] for NS; they manage her chronic pain as well & just on Naprosyn 220mg  Bid as needed now; plus her Oscal, etc...    Vit D defic>  On Vit D 5000u daily supplement; Vit D level is 35 & rec to continue same for now...     Anxiety>  It is recommended that she take the Klonopin Bid regularly & if not effective to contact me for an incr in dose or re-establish w/ LisaPoulis...  DERM>  Refer to Podiatry for mycotically infected great toenail...   Patient's Medications  New Prescriptions   No medications on file  Previous Medications   CALCIUM-VITAMIN D (OSCAL WITH D) 500-200 MG-UNIT PER TABLET    Take 1 tablet by mouth daily.     CLONAZEPAM (KLONOPIN) 0.5 MG TABLET    Take 1 tablet (0.5 mg total) by mouth 2 (two) times daily as needed.   FISH OIL-OMEGA-3 FATTY ACIDS 1000 MG CAPSULE    Take 1 g by mouth daily.     FLAXSEED, LINSEED, (FLAX SEED OIL) 1000 MG CAPS    Take 1 capsule by mouth daily.   PRAVASTATIN (PRAVACHOL) 40 MG  TABLET    Take 1 tablet (40 mg total) by mouth daily.  Modified Medications   No medications on file  Discontinued Medications   CHOLECALCIFEROL (VITAMIN D3) 5000 UNITS TABS    Take 5,000 Units by mouth daily.     NAPROXEN SODIUM (ANAPROX) 220 MG TABLET    Take 220 mg by mouth as needed.

## 2011-12-30 NOTE — Patient Instructions (Signed)
Today we updated your med list in our EPIC system...    Continue your current medications the same...  Today we did your follow up CXR & FASTING blood work...    We will call you w/ the report when available...  We will arrange for a Podiatry consult due to your left great toenail...  Call for any questions...  Let's continue our 6 month follow up visits & call sooner if needed for problems.Marland KitchenMarland Kitchen

## 2011-12-31 LAB — VITAMIN D 25 HYDROXY (VIT D DEFICIENCY, FRACTURES): Vit D, 25-Hydroxy: 35 ng/mL (ref 30–89)

## 2012-01-01 LAB — URINE CULTURE: Colony Count: >=100000

## 2012-01-03 ENCOUNTER — Other Ambulatory Visit: Payer: Self-pay | Admitting: *Deleted

## 2012-01-03 MED ORDER — AMOXICILLIN-POT CLAVULANATE 875-125 MG PO TABS: 1.0000 | ORAL_TABLET | Freq: Two times a day (BID) | ORAL | Status: AC

## 2012-01-03 NOTE — Telephone Encounter (Signed)
For treatment of the urine culture.  Pt is aware.

## 2012-01-07 ENCOUNTER — Ambulatory Visit: Payer: BC Managed Care – PPO

## 2012-04-04 ENCOUNTER — Telehealth: Payer: Self-pay | Admitting: Pulmonary Disease

## 2012-04-04 NOTE — Telephone Encounter (Signed)
I spoke with Aryana from [facility] and she states pt was unsure what her next follow up with SN was for. Monia Pouch states she is form the benefits dept and in order to approve the lab work that might be done they just need to know if it was be a routine follow up. i advised her it will be pt 6 month ROV. She voiced her understanding and needed nothing further

## 2012-04-17 ENCOUNTER — Ambulatory Visit (INDEPENDENT_AMBULATORY_CARE_PROVIDER_SITE_OTHER): Payer: Medicare Other | Admitting: *Deleted

## 2012-04-17 DIAGNOSIS — E7800 Pure hypercholesterolemia, unspecified: Secondary | ICD-10-CM

## 2012-04-17 LAB — HEPATIC FUNCTION PANEL
Alkaline Phosphatase: 51 U/L (ref 39–117)
Bilirubin, Direct: 0 mg/dL (ref 0.0–0.3)

## 2012-04-17 LAB — LDL CHOLESTEROL, DIRECT: Direct LDL: 136.1 mg/dL

## 2012-04-17 LAB — LIPID PANEL
HDL: 59.5 mg/dL (ref >39.00)
VLDL: 28.6 mg/dL (ref 0.0–40.0)

## 2012-04-19 ENCOUNTER — Ambulatory Visit (INDEPENDENT_AMBULATORY_CARE_PROVIDER_SITE_OTHER): Payer: Medicare Other | Admitting: Pharmacist

## 2012-04-19 VITALS — BP 110/70 | Wt 149.0 lb

## 2012-04-19 DIAGNOSIS — E7800 Pure hypercholesterolemia, unspecified: Secondary | ICD-10-CM

## 2012-04-19 NOTE — Patient Instructions (Addendum)
Continue your current medication list.  We will try to work on your diet and exercise to control your cholesterol.  Set a goal to exercise at least 5 days a week for 30 minutes.  Try to sign up for Silver Sneakers so you can learn new ways to exercise.  With your diet, try to choose low carb vegetables and fresh fruits  You can start Metamucil supplement to help with increase in fiber   Recheck labs in 3 months with Dr. Kriste Basque.

## 2012-04-24 NOTE — Progress Notes (Signed)
[name] [name] is a 61 y.o. female who presents for follow-up of dyslipidemia. She was last seen in lipid clinic > 1 yr ago.  Current lipid regimen includes pravastatin 40 mg daily at bedtime, fish oil 1,000 mg daily, and flaxseed capsules once daily.  She reports compliance and denies any adverse events associated with medications.  Her family history is significant for a mother who died at 55 with presumably a MI.  Her son currently lives with her.   Diet:  Pt reports that diet is not optimal.  Breakfast is cereal with Cheerios, corn flakes, or Raisin Bran.  Lunch is her biggest meal of the day.  It may be spaghetti, chicken, cube steak with gravy, hot dog, hamburgers, corn, fried squash, potatoes, or french fries.  She will normally just eat left overs from lunch for dinner.  She does like to snack on ice cream or milk shakes or fun size chocolate bars.  Liquids include water, sweet tea and lemonade.   Exercise: Pt has started walking on the treadmill about 30 minutes every other day.  She states it has helped the tingling in her legs and back pain.   Current Outpatient Prescriptions  Medication Sig Dispense Refill  . calcium-vitamin D (OSCAL WITH D) 500-200 MG-UNIT per tablet Take 1 tablet by mouth daily.        . clonazePAM (KLONOPIN) 0.5 MG tablet Take 1 tablet (0.5 mg total) by mouth 2 (two) times daily as needed.  60 tablet  5  . fish oil-omega-3 fatty acids 1000 MG capsule Take 1 g by mouth daily.        . Flaxseed, Linseed, (FLAX SEED OIL) 1000 MG CAPS Take 1 capsule by mouth daily.      . pravastatin (PRAVACHOL) 40 MG tablet Take 1 tablet (40 mg total) by mouth daily.  90 tablet  3    Allergies  Allergen Reactions  . Atorvastatin     REACTION: causes leg pain  . Macrodantin

## 2012-04-24 NOTE — Assessment & Plan Note (Signed)
Pt's cholesterol relatively controlled.  TC- 222 (goal<200), TG- 143 (goal<150), HDL- 60 (goal>45), LDL- 136 (goal<130).  LFTs are WNL.  Pt would prefer lifestyle changes to help reach goal rather than increase in medications at this time.  Encouraged decreased starches and increase in natural fibers and well as an increase in amount of exercise every day.  Will follow up in the 3 months.

## 2012-06-22 ENCOUNTER — Ambulatory Visit (INDEPENDENT_AMBULATORY_CARE_PROVIDER_SITE_OTHER): Payer: Medicare Other | Admitting: Obstetrics and Gynecology

## 2012-06-22 ENCOUNTER — Encounter: Payer: Self-pay | Admitting: Obstetrics and Gynecology

## 2012-06-22 DIAGNOSIS — N814 Uterovaginal prolapse, unspecified: Secondary | ICD-10-CM

## 2012-06-22 DIAGNOSIS — N8111 Cystocele, midline: Secondary | ICD-10-CM

## 2012-06-22 DIAGNOSIS — R1030 Lower abdominal pain, unspecified: Secondary | ICD-10-CM

## 2012-06-22 DIAGNOSIS — R109 Unspecified abdominal pain: Secondary | ICD-10-CM

## 2012-06-22 DIAGNOSIS — IMO0002 Reserved for concepts with insufficient information to code with codable children: Secondary | ICD-10-CM

## 2012-06-22 LAB — URINALYSIS W MICROSCOPIC + REFLEX CULTURE
Glucose, UA: NEGATIVE mg/dL
Hgb urine dipstick: NEGATIVE
Ketones, ur: NEGATIVE mg/dL
Nitrite: NEGATIVE
Protein, ur: NEGATIVE mg/dL

## 2012-06-22 NOTE — Progress Notes (Signed)
Patient came to see me today with a 2-3 week history of  suprapubic pain. She has had no change in her bowel habits. She is having no nausea or vomiting. She does not have dysuria or frequency of urination. She has no urgency of urination. She has had no vaginal bleeding.  Exam: Beola Cord present. Abdomen is soft without guarding rebound or masses. External and vaginal exam reveals a second-degree cystocele previously present. Cervix is clean. Uterus is normal size and shape with1 and  1/2 degrees of descensus. adnexaare within normal limits. Urinalysis shows 3-6 white blood cells per high-powered field.  Assessment: Suprapubic pain. Cystocele with uterine prolapse.  Plan: Urine culture done. We will treat if positive. If negative we will schedule pelvic ultrasound.

## 2012-06-22 NOTE — Patient Instructions (Signed)
We will call you with culture results. 

## 2012-06-24 LAB — URINE CULTURE

## 2012-06-26 ENCOUNTER — Other Ambulatory Visit: Payer: Self-pay | Admitting: *Deleted

## 2012-06-26 DIAGNOSIS — R1030 Lower abdominal pain, unspecified: Secondary | ICD-10-CM

## 2012-06-28 ENCOUNTER — Ambulatory Visit (INDEPENDENT_AMBULATORY_CARE_PROVIDER_SITE_OTHER): Payer: Medicare Other | Admitting: Obstetrics and Gynecology

## 2012-06-28 ENCOUNTER — Ambulatory Visit (INDEPENDENT_AMBULATORY_CARE_PROVIDER_SITE_OTHER): Payer: Medicare Other

## 2012-06-28 DIAGNOSIS — R109 Unspecified abdominal pain: Secondary | ICD-10-CM

## 2012-06-28 DIAGNOSIS — R1030 Lower abdominal pain, unspecified: Secondary | ICD-10-CM

## 2012-06-28 DIAGNOSIS — N949 Unspecified condition associated with female genital organs and menstrual cycle: Secondary | ICD-10-CM

## 2012-06-28 DIAGNOSIS — R102 Pelvic and perineal pain: Secondary | ICD-10-CM

## 2012-06-28 NOTE — Progress Notes (Signed)
Patient came back today for pelvic ultrasound due to suprapubic pain of 3+ weeks duration. She was here last week and urine did show some white blood cells but urine culture was negative. She is having no urinary symptoms. On ultrasound today at least 3 fibroids are seen. The ovarian size from 13 mm to 22 mm. There comparable in size to what they were in 2012 on ultrasound when she  was having no symptoms. Her ovaries are normal. Her cul-de-sac is free of fluid.  Assessment: Suprapubic pain of unknown etiology  Plan: Reassured about ultrasound. I told her I was skeptical that removing her fibroids would help since they've not changed in size. She is not anxious to have surgery anyway. For the moment we have asked to go back and see her gastroenterologist to look for a GI source of the pain.

## 2012-06-28 NOTE — Patient Instructions (Signed)
Make appointment to see your gastroenterologists regarding the pain. If that is not helpful it might be worth referring you to a  urologist. Let us know.

## 2012-07-04 DIAGNOSIS — C569 Malignant neoplasm of unspecified ovary: Secondary | ICD-10-CM

## 2012-07-04 HISTORY — DX: Malignant neoplasm of unspecified ovary: C56.9

## 2012-07-05 ENCOUNTER — Encounter: Payer: Self-pay | Admitting: Pulmonary Disease

## 2012-07-05 ENCOUNTER — Encounter: Payer: Self-pay | Admitting: *Deleted

## 2012-07-05 ENCOUNTER — Other Ambulatory Visit (INDEPENDENT_AMBULATORY_CARE_PROVIDER_SITE_OTHER): Payer: Medicare Other

## 2012-07-05 ENCOUNTER — Ambulatory Visit (INDEPENDENT_AMBULATORY_CARE_PROVIDER_SITE_OTHER): Payer: Medicare Other | Admitting: Pulmonary Disease

## 2012-07-05 VITALS — BP 118/68 | HR 70 | Temp 97.5°F | Ht 64.0 in | Wt 151.8 lb

## 2012-07-05 DIAGNOSIS — R1030 Lower abdominal pain, unspecified: Secondary | ICD-10-CM

## 2012-07-05 DIAGNOSIS — M549 Dorsalgia, unspecified: Secondary | ICD-10-CM

## 2012-07-05 DIAGNOSIS — Z8679 Personal history of other diseases of the circulatory system: Secondary | ICD-10-CM

## 2012-07-05 DIAGNOSIS — E7800 Pure hypercholesterolemia, unspecified: Secondary | ICD-10-CM

## 2012-07-05 DIAGNOSIS — R109 Unspecified abdominal pain: Secondary | ICD-10-CM

## 2012-07-05 DIAGNOSIS — F411 Generalized anxiety disorder: Secondary | ICD-10-CM

## 2012-07-05 DIAGNOSIS — Z8719 Personal history of other diseases of the digestive system: Secondary | ICD-10-CM

## 2012-07-05 DIAGNOSIS — Z23 Encounter for immunization: Secondary | ICD-10-CM

## 2012-07-05 DIAGNOSIS — M1990 Unspecified osteoarthritis, unspecified site: Secondary | ICD-10-CM

## 2012-07-05 LAB — CBC WITH DIFFERENTIAL/PLATELET
Basophils Absolute: 0 10*3/uL (ref 0.0–0.1)
Basophils Relative: 0.4 % (ref 0.0–3.0)
Eosinophils Absolute: 0.1 10*3/uL (ref 0.0–0.7)
HCT: 43.9 % (ref 36.0–46.0)
Hemoglobin: 14.6 g/dL (ref 12.0–15.0)
Lymphocytes Relative: 14.7 % (ref 12.0–46.0)
Lymphs Abs: 1 10*3/uL (ref 0.7–4.0)
MCHC: 33.1 g/dL (ref 30.0–36.0)
MCV: 98.8 fl (ref 78.0–100.0)
Monocytes Absolute: 0.7 10*3/uL (ref 0.1–1.0)
Neutro Abs: 4.9 10*3/uL (ref 1.4–7.7)
RDW: 13.5 % (ref 11.5–14.6)

## 2012-07-05 LAB — BASIC METABOLIC PANEL
CO2: 30 mEq/L (ref 19–32)
Calcium: 9 mg/dL (ref 8.4–10.5)
Chloride: 106 mEq/L (ref 96–112)
Glucose, Bld: 92 mg/dL (ref 70–99)
Sodium: 142 mEq/L (ref 135–145)

## 2012-07-05 MED ORDER — DICYCLOMINE HCL 20 MG PO TABS: 20.0000 mg | ORAL_TABLET | Freq: Three times a day (TID) | ORAL | Status: DC | PRN

## 2012-07-05 NOTE — Patient Instructions (Addendum)
Today we updated your med list in our EPIC system...    Continue your current medications the same...  We decided to check some f/u blood work, and sched a CT scan of your Abd & Pelvis...    We will call you w/ the results...  In the meanwhile, try the new Dicyclomine 20mg  tabs- one tab up to 3 times daily as needed for the lower abd discomfort...  We gave you the 2013 Flu vaccine today...  Call for any questions...  Let's plan a follow up visit in another 6 months.Marland KitchenMarland Kitchen

## 2012-07-05 NOTE — Progress Notes (Addendum)
Subjective:    Patient ID: [name] [name], female    DOB: [birth year], 61 y.o.   MRN: [id number]  HPI 61 y/o WF here for a follow up visit... she has multiple medical problems including severe Hypercholesterolemia prev followed in the [facility];  severe DJD/ Neck pain/ LBP followed by [name] [name] for Rheum & [name] [name] for Neurosurg (they do her pain management as well);  Anxiety prev eval by [name] [name]...   ~  June 23, 2010:  she is now on disability due the her arthritis per [name] [name]- still sees him & [name] [name] for NS due to prev neck & lower back surg... on pain med per [name] [name] (she doesn't know the name & didn't bring bottles)- this is her only perscription drug... she denies CP, palpit, SOB, edema;  she stopped Lipitor ~24mo ago to to leg cramps;  denies GI or GU symptoms (sees [name] [name] for GYN);  due for CXR, EKG, fasting blood work (TChol=334, LDL=238, sent to the [name] [name])...  ~  December 09, 2010:  She has mult somatic complaints> tired, sleepy, CP, SOB, anxiety;  the chest discomfort sound like [name] [name] & the SOB is a sensation like she can't get a deep breath;  under alot of stress she says & she thinks this affects her breathing;  not resting well & husb says she snores- we discussed sleep study to r/o OSA & in the meanwhile we will rx w/ [name] [name] 0.5mg  Bid...    She is now followed in the [facility]id Clinic for her severe hypercholesterolemia> intol to Lipitor w/ aching & now on [name] [name] + [name] [name] w/ some improvement in her numbers.    She continues to f/u w/ [name] [name] & [name] [name] for her DJD, neck & back pain> currently only using Aleve Prn...    Labs today looked good> CBC, Fe, TSH- all normal; Chems OK x BS=117;  Vit D = 32 on her 5000 u daily supplement (continue same).  ~  July 03, 2011:  62mo ROV & she is feeling well, just ret from an [place] cruise; she's been exercising on a treadmill she says & thinks it's helping; she did not bring med bottles or med list to OV today...    [name] [name]>  She notes occas  self limited palpit on & off; denies CP/ angina etc; recall hx mother died suddenly in her 50's w/ MI; we reviewed risk factor reduction strategy...    Chol>  On [name] [name] per [facility] w/ FLPs recorded below; repeat FLP not as good today w/ LDL up to 184 & she is encouraged to incr [name] [name] to 40 & f/u LC; she is also taking [name] [name] & [name] [name] per her preference...    IBS>  She denies abd pain, n/v/d/c/blood in stools etc; states she's doing satis w/o GI issues at present...    DJD/ neck pain, back pain>  She continues her f/u w/ [name] [name] for Rheum & [name] [name] for NS; they manage her chronic pain as well & just on Naprosyn 220mg  Bid as needed now; plus her Oscal, etc...    Vit D defic>  On Vit D 5000u daily supplement; last Vit D level was 32 in 3/12 & rec to continue same for now...    Anxiety>  We gave her [name] [name] 0.5mg Bid last OV but she ca't remember taking it, ?if it helped, not taking now; pt still anxious & stressed- declines med Rx...  ~  August 19, 2011:  62wk ROV and add-on for 3 problems: 1) she's noted incr  palpit & SOB since she's stopped her [name] [name]; palpit are like a racing that occurs on & off esp if anxious or at bedtime when quiet; also notes the SOB- "like I can't get a deep breath" at night while quiet & trying to sleep; we discussed resuming the [name] [name] 0.5mg  Bid regularly, avoid caffeine etc...  2) c/o heel pain esp on left foot, c/w achilles tendonitis; rec hot soaks, & NSAID Rx w/ Advil/ Aleve/ etc, & stretching exercise; next step = Podiatry if nec.Marland KitchenMarland Kitchen  3) wants f/u FLP now on [name] [name] x 6wks (improved- not yet to goal)...  See prob list below:  ~  December 29, 2101:  62-34mo ROV & she appears quite anxious, rapid speech & somewhat loud; encouraged to take the [name] [name] Bid regularly; she is concerned about her great toe nail- mycotic infection & offered Lamisil but she thinks it needs to come off & we will refer to Podiatry;  Also notes freq urination & wants Urinalysis & cult  today...    [name] [name]>  She notes occas self limited palpit on & off; denies CP/ angina etc; recall hx mother died suddenly in her 61's w/ MI; we gave her a lot of reassurance & reviewed risk factor reduction strategy; avoid caffeine & take the [name] [name]!    Chol>  On [name] [name] now & followed in the [name] [name]; FLP stable on this w/ [id number] & [id number]; she is also taking [name] [name] & [name] [name] per her preference; I told her she may need a stonger statin if [name] [name] + DIET don't get her closer to the goals...    IBS>  She denies abd pain, n/v/d/c/blood in stools etc; states she's doing satis w/o GI issues at present...    DJD/ neck pain, back pain>  She continues her f/u w/ [name] [name] for Rheum & [name] [name] for NS; they manage her chronic pain as well & just on Naprosyn 220mg  Bid as needed now; vs OTC analgesics, plus her Oscal, etc...    Vit D defic>  On Vit D 5000u daily supplement but recently ran out; Vit D level is still on the low side at 35 today- rec continuing VitD 5000u OTC daily supplement...    Anxiety>  She still appears quite anxious; supposed to be taking [name] [name] 0.5mg  Bid regularly for the palpit/ SOB/ etc; asked her to take Bid regularly so we could assess it's efficacy & see what her best dose will be...    DERM> onychomycosis would benefit from Lamisil in my opinion but she wonders if the nail needs to [name]; we will send her to Podiatry for their review... CXR 3/13 showed stable heart size, clear lungs, DJD in spine w/ neck hardware... LABS 3/13 showed:  FLP- not at goals on [name] [name];  Chems- wnl;  CBC- wnl;  TSH=1.77;  VitD=35;  UA- clear x few wbc & awaiting C&S...  ~  July 05, 2012:  62mo ROV & [name] [name] is c/o 23mo hx lower abd pain> off & on, noted worse if getting up or turning a certain way, noted it worse over bladder & saw GYN w/ [facility] showing fibroid but no acute changes & they want her to see GI, [name] [name];  She notes some constip- hard, every 3-4d, not painful/ no blood;  uses Metamucil as needed;  We  discussed further eval w/ CT Abd&Pelvis, treat w/ BENTYL 20mg  prn cramping, & refer to [name] [name] for colonoscopy...    Since last OV she has decided to stop the [name] [name] & is using  RYR instead "some woman told me about it"; she is not fasting for FLP today...     Similarly she has stopped her [name] [name] "I don't need it, no problems!"... We reviewed prob list, meds, xrays and labs> see below for updates >> OK Flu vaccine today. LABS 10/13:  Chems- wnl;  CBC- wnl;  Sed- 17...  ADDENDUM>> CT Abd&Pelvis 07/12/12>> signif soft tissue caking throughout the omentum, min pelvic ascites, no adnexal mass or primary tumor identified, sm fibroids... This likely represents primary peritoneal carcinomatous> we will check Ca125, Ca19-9, & arrange for Bx by IR along w/ referral to Oncology...           Problem List:  CHEST WALL PAIN, HX OF (ICD-V15.89),  MITRAL VALVE PROLAPSE, HX OF (ICD-V12.50) -  prev cardiac eval by DrNishan in 2005 due to coronary risk factors: pt's mother died suddenly in her 23's w/ MI... denies CP, palipit, dizziness, syncope, dyspnea, edema, etc...  ~  2DEcho 12/05 showed normal x ?HK inferiorly, EF= 55-60%... ~  NuclearStressTest 12/05 showed normal- no ischemia, no scar, EF= 65%... no change from 2003. ~  EKG= NSR, WNL.[name]. ~  CXR 9/10 & 9/11 showed NAD.[name]. ~  CXR 3/13 showed stable heart size, clear lungs, DJD in spine w/ neck hardware...   HYPERCHOLESTEROLEMIA (ICD-272.0) - prev followed in the lipid clinic... she was on Lip20 but stopped ~6/11 due to leg cramps. ~  FLP 8/08 showed TChol 224, TG 69, HDL 54, LDL 162... this was off meds- statin restarted. ~  FLP 8/09 on Lip20 showed TChol 195, TG 100, HDL 65, LDL 110... improved- needs better diet. ~  FLP 9/10 on Lip20 showed TChol 198, TG 104, HDL 61, LDL 116... she stopped ~6/11 due to leg cramps. ~  FLP 9/11 on diet alone showed TChol 334, TG 170, HDL 60, LDL 238... needs ret to [facility]. ~  FLP 11/11 on [name] [name] showed TChol 212, TG  111, HDL 54, LDL 129 ~  FLP 3/12 on [name] [name] showed TChol 222, TG 126, HDL 57, LDL 153 ~  FLP 9/12 on [name] [name] showed TChol 266, TG 138, HDL 62, LDL 184... Needs to incr [name] [name] & f/u LC. ~  FLP 11/12 on [name] [name] showed TChol 210, TG 136, HDL 57, LDL 141... She tells me that the [facility] told her to f/u here; Continue [name] [name] & diet Rx. ~  FLP 3/13 on [name] [name] showed TChol 217, TG 143, HDL 62, LDL 143... Told she may need stronger statin if [name] [name] + diet don't work better... ~  10/13:  She reports stopping the [name] [name] & has started RYR- "some woman told me about it"...  IRRITABLE BOWEL SYNDROME, HX OF (ICD-V12.79) - last colonoscopy 7/05 by [name] [name] was WNL- no divertics, no polyps, etc... f/u planned 29yrs... ~  10/13: she presented w/ lower abd discomfort > see above & we decided to check CT Abd&Pelvis- pending.  DEGENERATIVE JOINT DISEASE (ICD-715.90),  NECK PAIN (ICD-723.1),  BACK PAIN (ICD-724.5) >>  She has a severe prob w/ neck pain and LBP... s/p MRI scans and prev Lumbar Lam in 2006 by [name] [name]... f/u myelogram and CT w/ bone spurs in neck and arthritis in her lower back... s/p ant cerv discectomy & fusion C6-7 for stenosis w/ myelopathy per [name] [name]... prev followed by DrBarker in Pain Management but now relying on [name] [name] & [name] [name]... she also sees DrKuzma for Ortho w/ severe DJD of hands, Rx w/ Dosepak and Voltaren- min benefit & he has rec surgery but she is holding  off & seeing [name] [name] for Rheum as noted... ~  9/11:  she notes now on disability per [name] who continues to follow her regularly. ~  9/12:  She maintains f/u w/ [name] [name] for Rheum & [name] [name] for Neurosurg; she indicates recent MRI spine done, we don't have recent notes...  VITAMIN D DEFICIENCY (ICD-268.9) - Vit D level 8/09 was 14 & she was started on 50,000 u caps weekly, then switched to 5000 u daily on her own...   ~  labs 9/10 showed Vit D level = 35... OK to continue the 5000 u daily for now. ~  labs 9/11 showed Vit D level = 39...  rec to continue 5000 u daily... ~  Labs 3/13 showed Vit D level = 35... States she recently ran out of Vit D; rec to continue 5000u daily...  ANXIETY DISORDER (ICD-300.00) - prev seen by psyche DrLisaPoulus & was on Celexa & Prosom... not currently on medications for nerves... ~  3/12:  We tried to add [name] [name] 0.5mg  Bid but she never took it... ~  11/12:  Pt c/o incr palpit esp when quiet at night; rec to restart [name] [name] 0.5mg  Bid regularly... ~  3/13:  rec to take [name] [name] 0.5mg  Bid regularly or consider re-establish w/ LisaPoulis... ~  10/13: she stopped the [name] [name] & never re-established w/ LisaPoulis...  Health Maintenance - GYN= [name] [name] w/ f/u PAP & Mammogram due now... she had prev BMD ?where... she was laid off work at [employer] where she had been for 69yrs, & worked for [employer] in [employer] for 42yrs, now on disability due to her arthritis...   Past Surgical History  Procedure Date  . Hand surgery 1996  . Tubal ligation 1980  . Hysteroscopy 2004    D&C  . Dilation and curettage of uterus 2004    WITH HYSTEROSCOPY  . Neck surgery 2009    SPURS  . Cyst on neck 2011     Outpatient Encounter Prescriptions as of 07/05/2012  Medication Sig Dispense Refill  . calcium-vitamin D (OSCAL WITH D) 500-200 MG-UNIT per tablet Take 1 tablet by mouth daily.        . clonazePAM ([name] [name]) 0.5 MG tablet Take 1 tablet (0.5 mg total) by mouth 2 (two) times daily as needed.  60 tablet  5  . [name] [name]-omega-3 fatty acids 1000 MG capsule Take 1 g by mouth daily.        . Flaxseed, Linseed, ([name] [name]) 1000 MG CAPS Take 1 capsule by mouth daily.      . pravastatin ([name] [name]) 40 MG tablet Take 1 tablet (40 mg total) by mouth daily.  90 tablet  3    Allergies  Allergen Reactions  . Atorvastatin     REACTION: causes leg pain  . Macrodantin     Current Medications, Allergies, Past Medical History, Past Surgical History, Family History, and Social History were reviewed in  [facility] record.   Review of Systems         See HPI - all other systems neg except as noted... The patient complains of difficulty walking.  The patient denies anorexia, fever, weight loss, weight gain, vision loss, decreased hearing, hoarseness, chest pain, syncope, dyspnea on exertion, peripheral edema, prolonged cough, headaches, hemoptysis, abdominal pain, melena, hematochezia, severe indigestion/heartburn, hematuria, incontinence, muscle weakness, suspicious skin lesions, transient blindness, depression, unusual weight change, abnormal bleeding, enlarged lymph nodes, and angioedema.     Objective:   Physical Exam     WD, WN,  61 y/o WF in NAD... GENERAL:  Alert & oriented; pleasant & cooperative... HEENT:  [place]/AT, EOM-wnl, PERRLA, EACs-clear, TMs-wnl, NOSE-clear, THROAT-clear & wnl. NECK:  Supple w/ decrROM; scar of ant cerv discectomy; no JVD; normal carotid impulses w/o bruits;  no thyromegaly or nodules palpated; no lymphadenopathy. CHEST:  Clear to P & A; without wheezes/ rales/ or rhonchi. HEART:  Regular Rhythm; without murmurs/ rubs/ or gallops. ABDOMEN:  Soft & nontender; normal bowel sounds; no organomegaly or masses detected. EXT: +heberdens & bouchards nodes;  no varicose veins/ venous insuffic/ or edema. NEURO:  CN's intact; motor testing normal; sensory testing normal; gait normal & balance OK. DERM:  No lesions noted; no rash etc...  RADIOLOGY DATA:  Reviewed in the EPIC EMR & discussed w/ the patient...  LABORATORY DATA:  Reviewed in the EPIC EMR & discussed w/ the patient...   Assessment & Plan:    [name] [name]/ Palpit>  She notes occas self limited palpit on & off; denies CP/ angina etc; recall hx mother died suddenly in her 8's w/ MI; we reviewed risk factor reduction strategy... Rec to take the [name] [name] regularly==> but she decided to stop it.     Chol>  On [name] [name] now & labs stable but not at goals; may need stronger statin if the [name] [name] +  diet don't work better, she wants to give it a little longer; she is also taking [name] [name] & [name] [name] per her preference... 10/13> she stopped the [name] [name] in favor of RYR...     IBS>  She presented 10/13 w/ lower abd discomfort & we decided to check CT Abd & refer to [name] [name] for GI eval...     DJD/ neck pain, back pain>  She continues her f/u w/ [name] [name] for Rheum & [name] [name] for NS; they manage her chronic pain as well & just on Naprosyn 220mg  Bid as needed now; plus her Oscal, etc...    Vit D defic>  On Vit D 5000u daily supplement; Vit D level is 35 & rec to continue same for now...     Anxiety>  It is recommended that she take the [name] [name] Bid regularly & if not effective to contact me for an incr in dose or re-establish w/ LisaPoulis (she did neither & decided to stop the med stating she doesn't need it)...  DERM>  Refer to Podiatry for mycotically infected great toenail...   Patient's Medications  New Prescriptions   DICYCLOMINE (BENTYL) 20 MG TABLET    Take 1 tablet (20 mg total) by mouth 3 (three) times daily as needed (abdominal cramping).  Previous Medications   CALCIUM-VITAMIN D (OSCAL WITH D) 500-200 MG-UNIT PER TABLET    Take 1 tablet by mouth daily.     CLONAZEPAM ([name] [name]) 0.5 MG TABLET    Take 1 tablet (0.5 mg total) by mouth 2 (two) times daily as needed.   [name] [name]-OMEGA-3 FATTY ACIDS 1000 MG CAPSULE    Take 1 g by mouth daily.     FLAXSEED, LINSEED, ([name] [name]) 1000 MG CAPS    Take 1 capsule by mouth daily.   RED YEAST RICE 600 MG CAPS    Take 2 capsules by mouth daily.  Modified Medications   No medications on file  Discontinued Medications   PRAVASTATIN ([name] [name]) 40 MG TABLET    Take 1 tablet (40 mg total) by mouth daily.

## 2012-07-09 ENCOUNTER — Encounter: Payer: Self-pay | Admitting: Pulmonary Disease

## 2012-07-12 ENCOUNTER — Ambulatory Visit (INDEPENDENT_AMBULATORY_CARE_PROVIDER_SITE_OTHER)
Admission: RE | Admit: 2012-07-12 | Discharge: 2012-07-12 | Disposition: A | Discharge status: Home / Self Care | Payer: Medicare Other | Source: Ambulatory Visit | Attending: Pulmonary Disease | Admitting: Pulmonary Disease

## 2012-07-12 ENCOUNTER — Ambulatory Visit (INDEPENDENT_AMBULATORY_CARE_PROVIDER_SITE_OTHER): Payer: Medicare Other | Admitting: *Deleted

## 2012-07-12 ENCOUNTER — Other Ambulatory Visit: Payer: Self-pay | Admitting: Pulmonary Disease

## 2012-07-12 ENCOUNTER — Ambulatory Visit (INDEPENDENT_AMBULATORY_CARE_PROVIDER_SITE_OTHER): Payer: Medicare Other | Admitting: Pharmacist

## 2012-07-12 VITALS — Wt 153.0 lb

## 2012-07-12 DIAGNOSIS — R109 Unspecified abdominal pain: Secondary | ICD-10-CM

## 2012-07-12 DIAGNOSIS — E7800 Pure hypercholesterolemia, unspecified: Secondary | ICD-10-CM

## 2012-07-12 DIAGNOSIS — R0989 Other specified symptoms and signs involving the circulatory and respiratory systems: Secondary | ICD-10-CM

## 2012-07-12 DIAGNOSIS — C762 Malignant neoplasm of abdomen: Secondary | ICD-10-CM

## 2012-07-12 DIAGNOSIS — R1030 Lower abdominal pain, unspecified: Secondary | ICD-10-CM

## 2012-07-12 DIAGNOSIS — E785 Hyperlipidemia, unspecified: Secondary | ICD-10-CM

## 2012-07-12 LAB — HEPATIC FUNCTION PANEL
AST: 27 U/L (ref 0–37)
Albumin: 3.8 g/dL (ref 3.5–5.2)

## 2012-07-12 LAB — LIPID PANEL
Cholesterol: 250 mg/dL — ABNORMAL HIGH (ref 0–200)
Triglycerides: 146 mg/dL (ref 0.0–149.0)
VLDL: 29.2 mg/dL (ref 0.0–40.0)

## 2012-07-12 MED ORDER — IOHEXOL 300 MG/ML  SOLN
100.0000 mL | Freq: Once | INTRAMUSCULAR | Status: AC | PRN
  Administered 2012-07-12: 100 mL via INTRAVENOUS

## 2012-07-12 NOTE — Assessment & Plan Note (Addendum)
LDL increased to 163 mg/dl while off of statin and on red yeast rice (goal<130).  HDL 51 (goal>40), TG 145 (JXBJ<478), TC 250 (goal<200).  LFTs good.   Plan: Restart pravastatin 40 mg daily at bedtime.  Continue exercising regularly.  Try to increase protein intake. Follow up in January.

## 2012-07-12 NOTE — Patient Instructions (Addendum)
We will let you know your cholesterol results.  Try to increase your protein intake.  Continue exercising regularly.

## 2012-07-12 NOTE — Progress Notes (Signed)
[name] [name] is a 61 yo female who presents for follow-up of dyslipidemia. She was last seen in the lipid clinic on 04/19/12. Lipid panel was not done prior to encounter due to miscommunication. Current lipid regimen includes fish oil 1,000 mg daily, flaxseed capsules once daily, and red yeast rice 600 mg 2 capsules daily. She discontinued pravastatin 40 mg qHS about 1 month ago to see if red yeast rice would be more effective in lowering her cholesterol.  She reports compliance with her regimen and denies any adverse events associated with medications.  Diet: Patient reports an inconsistent diet. Breakfast typically includes Cheerios or frosted corn flakes with 2% milk. Lunch is usually a home cooked meal with some type of canned vegetable. Dinner includes any leftovers from lunch. [name] [name] reports snacking on chips and trail mix between meals. Liquids include mostly water and about 1 soda per month. [name] [name] does not indulge in too many sweets.  Exercise: Patient walks about 30 minutes on the treadmill every other day. She says it has helped with the arthritis in her back.   Current Outpatient Prescriptions on File Prior to Visit  Medication Sig Dispense Refill  . calcium-vitamin D (OSCAL WITH D) 500-200 MG-UNIT per tablet Take 1 tablet by mouth daily.        . fish oil-omega-3 fatty acids 1000 MG capsule Take 1 g by mouth daily.        . Flaxseed, Linseed, (FLAX SEED OIL) 1000 MG CAPS Take 1 capsule by mouth daily.      . Red Yeast Rice 600 MG CAPS Take 2 capsules by mouth daily.      . clonazePAM (KLONOPIN) 0.5 MG tablet Take 1 tablet (0.5 mg total) by mouth 2 (two) times daily as needed.  60 tablet  5  . dicyclomine (BENTYL) 20 MG tablet Take 1 tablet (20 mg total) by mouth 3 (three) times daily as needed (abdominal cramping).  90 tablet  6   No current facility-administered medications on file prior to visit.    Lipid Panel (to be drawn again today)    Component Value Date/Time   CHOL 222* 04/17/2012 1134   TRIG 143.0 04/17/2012 1134   HDL 59.50 04/17/2012 1134   CHOLHDL 4 04/17/2012 1134   VLDL 28.6 04/17/2012 1134   LDLCALC 116* 06/13/2009 0000   Hepatic Function Panel     Component Value Date/Time   PROT 7.0 04/17/2012 1134   ALBUMIN 4.1 04/17/2012 1134   AST 20 04/17/2012 1134   ALT 23 04/17/2012 1134   ALKPHOS 51 04/17/2012 1134   BILITOT 0.6 04/17/2012 1134   BILIDIR 0.0 04/17/2012 1134    Allergies  Allergen Reactions  . Atorvastatin     REACTION: causes leg pain  . Macrodantin

## 2012-07-13 ENCOUNTER — Other Ambulatory Visit: Payer: Medicare Other

## 2012-07-13 DIAGNOSIS — C762 Malignant neoplasm of abdomen: Secondary | ICD-10-CM

## 2012-07-14 LAB — CEA: CEA: 0.8 ng/mL (ref 0.0–5.0)

## 2012-07-14 LAB — CANCER ANTIGEN 19-9: CA 19-9: 4.6 U/mL (ref <35.0)

## 2012-07-20 ENCOUNTER — Other Ambulatory Visit: Payer: Self-pay | Admitting: Radiology

## 2012-07-21 ENCOUNTER — Telehealth: Payer: Self-pay | Admitting: Oncology

## 2012-07-21 NOTE — Telephone Encounter (Signed)
C/D 07/21/12 for appt. 07/27/12

## 2012-07-24 ENCOUNTER — Encounter (HOSPITAL_COMMUNITY): Payer: Self-pay

## 2012-07-24 ENCOUNTER — Ambulatory Visit (HOSPITAL_COMMUNITY)
Admission: RE | Admit: 2012-07-24 | Discharge: 2012-07-24 | Disposition: A | Discharge status: Home / Self Care | Payer: Medicare Other | Source: Ambulatory Visit | Attending: Pulmonary Disease | Admitting: Pulmonary Disease

## 2012-07-24 DIAGNOSIS — C801 Malignant (primary) neoplasm, unspecified: Secondary | ICD-10-CM | POA: Insufficient documentation

## 2012-07-24 DIAGNOSIS — C786 Secondary malignant neoplasm of retroperitoneum and peritoneum: Secondary | ICD-10-CM | POA: Insufficient documentation

## 2012-07-24 DIAGNOSIS — C762 Malignant neoplasm of abdomen: Secondary | ICD-10-CM

## 2012-07-24 LAB — CBC
Hemoglobin: 14.5 g/dL (ref 12.0–15.0)
MCV: 96.1 fL (ref 78.0–100.0)
Platelets: 330 10*3/uL (ref 150–400)
RBC: 4.57 MIL/uL (ref 3.87–5.11)
WBC: 7.2 10*3/uL (ref 4.0–10.5)

## 2012-07-24 LAB — PROTIME-INR: Prothrombin Time: 12.6 seconds (ref 11.6–15.2)

## 2012-07-24 MED ORDER — FENTANYL CITRATE 0.05 MG/ML IJ SOLN
INTRAMUSCULAR | Status: AC
  Filled 2012-07-24: qty 4

## 2012-07-24 MED ORDER — HYDROCODONE-ACETAMINOPHEN 5-325 MG PO TABS
1.0000 | ORAL_TABLET | ORAL | Status: DC | PRN
  Filled 2012-07-24: qty 2

## 2012-07-24 MED ORDER — SODIUM CHLORIDE 0.9 % IV SOLN
INTRAVENOUS | Status: DC
  Administered 2012-07-24: 10:00:00 via INTRAVENOUS

## 2012-07-24 MED ORDER — MIDAZOLAM HCL 2 MG/2ML IJ SOLN
INTRAMUSCULAR | Status: AC
  Filled 2012-07-24: qty 4

## 2012-07-24 NOTE — H&P (Signed)
Agree 

## 2012-07-24 NOTE — H&P (Signed)
Chief Complaint: "I'm here for a biopsy" Referring Physician:Nadel HPI: [name] [name] is an 61 y.o. female with new c/o abd pain. Progressive workup has eventually found evidence of omental caking on CT. She is referred for CT biopsy to help assist in tissue diagnosis. PMHx reviewed as below. Pt feels ok otherwise except some anxiety this am.  Past Medical History:  Past Medical History  Diagnosis Date  . Rheumatoid arthritis   . Endometrial polyp   . Cystocele with uterine descensus     AND RECTOCELE  . Elevated cholesterol     Past Surgical History:  Past Surgical History  Procedure Date  . Hand surgery 1996  . Tubal ligation 1980  . Hysteroscopy 2004    D&C  . Dilation and curettage of uterus 2004    WITH HYSTEROSCOPY  . Neck surgery 2009    SPURS  . Cyst on neck 2011    Family History:  Family History  Problem Relation Age of Onset  . Heart disease Mother   . Osteoporosis Sister     Social History:  reports that she has never smoked. She does not have any smokeless tobacco history on file. She reports that she does not drink alcohol or use illicit drugs.  Allergies:  Allergies  Allergen Reactions  . Atorvastatin     REACTION: causes leg pain  . Macrodantin     Medications: Patient's Medications   New Prescriptions    DICYCLOMINE (BENTYL) 20 MG TABLET  Take 1 tablet (20 mg total) by mouth 3 (three) times daily as needed (abdominal cramping).   Previous Medications    CALCIUM-VITAMIN D (OSCAL WITH D) 500-200 MG-UNIT PER TABLET  Take 1 tablet by mouth daily.    CLONAZEPAM (KLONOPIN) 0.5 MG TABLET  Take 1 tablet (0.5 mg total) by mouth 2 (two) times daily as needed.    FISH OIL-OMEGA-3 FATTY ACIDS 1000 MG CAPSULE  Take 1 g by mouth daily.    FLAXSEED, LINSEED, (FLAX SEED OIL) 1000 MG CAPS  Take 1 capsule by mouth daily.    RED YEAST RICE 600 MG CAPS  Take 2 capsules by mouth daily.   Modified Medications    No medications on file   Discontinued Medications     PRAVASTATIN (PRAVACHOL) 40 MG TABLET  Take 1 tablet (40 mg total) by mouth daily.      Please HPI for pertinent positives, otherwise complete 10 system ROS negative.  Physical Exam: Blood pressure 164/104, pulse 104, temperature 98.4 F (36.9 C), temperature source Oral, resp. rate 20, height 5\' 4"  (1.626 m), weight 140 lb (63.504 kg), SpO2 100.00%. Body mass index is 24.03 kg/(m^2).   General Appearance:  Alert, cooperative, no distress, appears stated age  Head:  Normocephalic, without obvious abnormality, atraumatic  ENT: Unremarkable  Neck: Supple, symmetrical, trachea midline, no adenopathy, thyroid: not enlarged, symmetric, no tenderness/mass/nodules  Lungs:   Clear to auscultation bilaterally, no w/r/r, respirations unlabored without use of accessory muscles.  Heart:  Regular rate and rhythm, S1, S2 normal, no murmur, rub or gallop. Carotids 2+ without bruit.  Abdomen:   Soft, non-tender, non distended. Bowel sounds active all four quadrants,  no masses, no organomegaly.  Extremities: Extremities normal, atraumatic, no cyanosis or edema  Neurologic: Normal affect, no gross deficits.   No results found for this or any previous visit (from the past 48 hour(s)). No results found.  Assessment/Plan Omental caking concerning for malignant process. For CT guided biopsy today, discussed procedure with pt  and husband in detail. Labs pending Consent signed in chart  Brayton El PA-C 07/24/2012, 9:36 AM

## 2012-07-24 NOTE — Procedures (Signed)
Procedure:  CT guided core biopsy of omentum Findings:  Core biopsy of omentum via 17 G needle with 18 G core passes x 4.  Solid tissue obtained. No complications.

## 2012-07-27 ENCOUNTER — Encounter: Payer: Self-pay | Admitting: Oncology

## 2012-07-27 ENCOUNTER — Ambulatory Visit (HOSPITAL_BASED_OUTPATIENT_CLINIC_OR_DEPARTMENT_OTHER): Payer: Medicare Other | Admitting: Oncology

## 2012-07-27 ENCOUNTER — Telehealth: Payer: Self-pay | Admitting: Oncology

## 2012-07-27 ENCOUNTER — Ambulatory Visit (HOSPITAL_BASED_OUTPATIENT_CLINIC_OR_DEPARTMENT_OTHER): Payer: Medicare Other

## 2012-07-27 VITALS — BP 153/99 | HR 113 | Temp 98.0°F | Resp 20 | Ht 64.0 in | Wt 149.5 lb

## 2012-07-27 DIAGNOSIS — C801 Malignant (primary) neoplasm, unspecified: Secondary | ICD-10-CM

## 2012-07-27 DIAGNOSIS — D259 Leiomyoma of uterus, unspecified: Secondary | ICD-10-CM

## 2012-07-27 DIAGNOSIS — R109 Unspecified abdominal pain: Secondary | ICD-10-CM

## 2012-07-27 DIAGNOSIS — M542 Cervicalgia: Secondary | ICD-10-CM

## 2012-07-27 NOTE — Progress Notes (Signed)
Checked in new patient. No financial issues. °

## 2012-07-27 NOTE — Telephone Encounter (Signed)
gv and printed appt schedule for pt for OCT and NOV °

## 2012-07-29 NOTE — Progress Notes (Signed)
[facility] New Patient Consult   Referring MD: Floralee Brawdy 61 y.o.  [birth year]    Reason for Referral: Omental caking with a biopsy confirming metastatic carcinoma     HPI: She reports a history of low abdominal discomfort for the past several months with associated "bloating ". She saw Dr.Gottsegen on 06/22/2012. A GYN exam revealed a cystocele. A pelvic ultrasound on 07/28/2012 revealed at least 3 uterine fibroids-unchanged from 2012, and normal ovaries. The discomfort persisted and she saw Dr. Kriste Basque. She was referred for a CT of the abdomen and pelvis on 07/12/2012. This revealed soft tissue caking throughout the greater omentum, minimal ascites in the pelvis, and the adnexa appeared unremarkable. Small uterine fibroids were noted. No other soft tissue masses within the abdomen or pelvis. No lymphadenopathy. The liver, spleen, pancreas, adrenal glands, and kidneys appeared normal.  She was referred for a CT-guided biopsy of the omentum on 07/24/2012. The pathology confirmed metastatic carcinoma. The differential diagnosis included a metastatic gynecologic malignancy.      Past Medical History  Diagnosis Date  . Rheumatoid arthritis   . Endometrial polyp   . Cystocele with uterine descensus     AND RECTOCELE  . Elevated cholesterol    .    Uterine fibroids  .    G2 P2  .   History of recurrent urinary tract infection  . Last colonoscopy in July of 2005, negative mammogram in December of 2012  Past Surgical History  Procedure Date  . Hand surgery 1996  . Tubal ligation 1980  . Hysteroscopy 2004    D&C  . Dilation and curettage of uterus 2004    WITH HYSTEROSCOPY  . Neck surgery-C5-C6 anterior cervical discectomy and fusion  2009    SPURS  . Cyst on neck 2011    Family History  Problem Relation Age of Onset  . Heart disease Mother   . Osteoporosis Sister    .   Head and neck cancer                                                                 maternal grandfather  .   Bladder cancer                                                                            father  Current outpatient prescriptions:calcium-vitamin D (OSCAL WITH D) 500-200 MG-UNIT per tablet, Take 1 tablet by mouth daily.  , Disp: , Rfl: ;  clonazePAM (KLONOPIN) 0.5 MG tablet, Take 1 tablet (0.5 mg total) by mouth 2 (two) times daily as needed., Disp: 60 tablet, Rfl: 5;  dicyclomine (BENTYL) 20 MG tablet, Take 1 tablet (20 mg total) by mouth 3 (three) times daily as needed (abdominal cramping)., Disp: 90 tablet, Rfl: 6 fish oil-omega-3 fatty acids 1000 MG capsule, Take 1 g by mouth daily.  , Disp: , Rfl: ;  Flaxseed, Linseed, (FLAX SEED OIL) 1000 MG CAPS, Take 1 capsule  by mouth daily., Disp: , Rfl: ;  Red Yeast Rice 600 MG CAPS, Take 2 capsules by mouth daily., Disp: , Rfl:   Allergies:  Allergies  Allergen Reactions  . Atorvastatin     REACTION: causes leg pain  . 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 No tobacco use. Rare alcohol use. No risk factor for HIV or hepatitis.   ROS:   Positives include: Low abdomen/suprapubic discomfort, polyuria, chronic numbness and cramping in the right lower leg following back surgery  A complete ROS was otherwise negative.  Physical Exam:  Blood pressure 153/99, pulse 113, temperature 98 F (36.7 C), temperature source Oral, resp. rate 20, height 5\' 4"  (1.626 m), weight 149 lb 8 oz (67.813 kg).  HEENT: Oropharynx without visible mass, neck without mass Lungs: Clear bilaterally Cardiac: Regular rate and rhythm Abdomen: No mass, no hepatosplenomegaly, no apparent ascites  Vascular: No leg edema Lymph nodes: No cervical, supraclavicular, axillary, or inguinal nodes Neurologic: Alert and oriented, the motor exam appears intact in the upper and lower tremors. The deep tendon reflexes are 2+ and  symmetric at the knees Skin: No rash Musculoskeletal: Arthritic changes at the hands bilaterally Breasts: Bilateral breast without mass   LAB:  CBC  Lab Results  Component Value Date   WBC 7.2 07/24/2012   HGB 14.5 07/24/2012   HCT 43.9 07/24/2012   MCV 96.1 07/24/2012   PLT 330 07/24/2012     CMP      Component Value Date/Time   NA 142 07/05/2012 1048   K 4.3 07/05/2012 1048   CL 106 07/05/2012 1048   CO2 30 07/05/2012 1048   GLUCOSE 92 07/05/2012 1048   BUN 20 07/05/2012 1048   CREATININE 0.8 07/05/2012 1048   CALCIUM 9.0 07/05/2012 1048   PROT 6.9 07/12/2012 0933   ALBUMIN 3.8 07/12/2012 0933   AST 27 07/12/2012 0933   ALT 24 07/12/2012 0933   ALKPHOS 58 07/12/2012 0933   BILITOT 0.9 07/12/2012 0933   GFRNONAA 86.26 06/23/2010 1146   GFRAA 111 05/16/2008 0000   07/13/2012-CA 19-9-4.6                     CA 125-69                     CEA-0.8  Radiology: I reviewed the 07/12/2012 CT of the abdomen and pelvis with Ms. Dearman and her family. There is marked in omental thickening in multiple areas including a large "cake "in the low midline.    Assessment/Plan:   1. Soft tissue thickening in the omentum on a CT 07/12/2012 with a biopsy confirming metastatic carcinoma 2. Low abdomen/suprapubic pain-likely secondary to the marketed omentum thickening at the low abdomen/pelvis 3. Uterine fibroid 4. Chronic neck and back pain   Disposition:   She has been diagnosed with metastatic carcinoma. She is symptomatic with pain and fullness in the low abdomen. I discussed the pathology findings and differential diagnosis with Ms. Cherian and her family. The differential diagnosis includes primary peritoneal carcinoma, a GYN malignancy, and metastatic carcinoma from another tumor site. There is no evidence of a primary tumor site upon review of her history, physical exam, and CT.  I will ask the pathologist to perform additional immunohistochemical stains on the 07/24/2012 core biopsy.  Ms. Muscato will be referred for a staging PET scan. If no  additional site of disease is found we will consider a GYN oncology referral. We will also consider submitting the core biopsy material for an unknown primary gene array assay.  Ms. Linnen will return for an office visit after the staging PET scan.  Gardenia Witter 07/29/2012, 6:34 AM

## 2012-07-31 ENCOUNTER — Telehealth: Payer: Self-pay | Admitting: *Deleted

## 2012-07-31 ENCOUNTER — Encounter: Payer: Self-pay | Admitting: Oncology

## 2012-07-31 NOTE — Progress Notes (Signed)
Put daughter's fmla form on nurse's desk. °

## 2012-07-31 NOTE — Telephone Encounter (Signed)
Spoke with patient by phone to answer her questions re: her schedule and tests.  Questions were answered.  She verbalized comprehension.

## 2012-08-01 ENCOUNTER — Encounter: Payer: Self-pay | Admitting: Oncology

## 2012-08-01 NOTE — Progress Notes (Unsigned)
Faxed daughter's fmla form to [place] @ [phone]; mailed originals to patient's home.

## 2012-08-03 ENCOUNTER — Encounter (HOSPITAL_COMMUNITY)
Admission: RE | Admit: 2012-08-03 | Discharge: 2012-08-03 | Disposition: A | Discharge status: Home / Self Care | Payer: Medicare Other | Source: Ambulatory Visit | Attending: Oncology | Admitting: Oncology

## 2012-08-03 DIAGNOSIS — C786 Secondary malignant neoplasm of retroperitoneum and peritoneum: Secondary | ICD-10-CM | POA: Insufficient documentation

## 2012-08-03 DIAGNOSIS — C801 Malignant (primary) neoplasm, unspecified: Secondary | ICD-10-CM

## 2012-08-03 DIAGNOSIS — R188 Other ascites: Secondary | ICD-10-CM | POA: Insufficient documentation

## 2012-08-03 MED ORDER — FLUDEOXYGLUCOSE F - 18 (FDG) INJECTION
17.8000 | Freq: Once | INTRAVENOUS | Status: AC | PRN
  Administered 2012-08-03: 17.8 via INTRAVENOUS

## 2012-08-07 ENCOUNTER — Ambulatory Visit (HOSPITAL_BASED_OUTPATIENT_CLINIC_OR_DEPARTMENT_OTHER): Payer: Medicare Other | Admitting: Oncology

## 2012-08-07 ENCOUNTER — Telehealth: Payer: Self-pay | Admitting: Oncology

## 2012-08-07 VITALS — BP 157/100 | HR 104 | Temp 97.3°F | Resp 20 | Ht 64.0 in | Wt 148.2 lb

## 2012-08-07 DIAGNOSIS — C801 Malignant (primary) neoplasm, unspecified: Secondary | ICD-10-CM

## 2012-08-07 DIAGNOSIS — F411 Generalized anxiety disorder: Secondary | ICD-10-CM

## 2012-08-07 DIAGNOSIS — C786 Secondary malignant neoplasm of retroperitoneum and peritoneum: Secondary | ICD-10-CM

## 2012-08-07 DIAGNOSIS — R109 Unspecified abdominal pain: Secondary | ICD-10-CM

## 2012-08-07 DIAGNOSIS — K668 Other specified disorders of peritoneum: Secondary | ICD-10-CM

## 2012-08-07 MED ORDER — ALPRAZOLAM 0.5 MG PO TABS: 0.5000 mg | ORAL_TABLET | Freq: Three times a day (TID) | ORAL | Status: DC | PRN

## 2012-08-07 NOTE — Progress Notes (Signed)
   [facility]    OFFICE PROGRESS NOTE   INTERVAL HISTORY:   She returns as scheduled. She continues to have "pressure "in the low abdomen with associated urinary frequency. She has noted "soreness "over the left anterolateral chest. No associated symptoms. Her additional immunohistochemical stains on the biopsy from 07/24/2012 found the malignant cells to be positive for WT-1, cytokeratin 7, and estrogen receptor. The tumor cells were negative for TTF-1, progesterone receptor, napsin A, gross cystic disease fluid protein, cytokeratin 20, CEA, CD X2, calretinin, and S100. The histologic features in conjunction with the imaged chemistry is suggestive of a primary GYN tumor.  Objective:  Vital signs in last 24 hours:  Blood pressure 157/100, pulse 104, temperature 97.3 F (36.3 C), temperature source Oral, resp. rate 20, height 5\' 4"  (1.626 m), weight 148 lb 3.2 oz (67.223 kg).   Resp: Lungs clear bilaterally Cardio: Regular rate and rhythm GI: No hepatosplenomegaly, no mass, mild tenderness in the mid and right low abdomen   Lab Results:  Lab Results  Component Value Date   WBC 7.2 07/24/2012   HGB 14.5 07/24/2012   HCT 43.9 07/24/2012   MCV 96.1 07/24/2012   PLT 330 07/24/2012    X-rays: PET scan on 08/03/2012-no abnormal hypermetabolic activity in the neck or chest. The areas of omental/peritoneal nodularity are hypermetabolic. A dominant pelvic omental mass has an SUV of 9.6. Multiple pelvic foci of peritoneal based hypermetabolism are identified. Small volume ascites in the pelvis. Prominence of the left ovary/adnexa.  Medications: I have reviewed the patient's current medications.  Assessment/Plan: 1.Soft tissue thickening in the omentum on a CT 07/12/2012 with a biopsy 07/24/2012 confirming metastatic carcinoma , immunohistochemical stains and histology suggestive of a GYN primary              -staging PET scan 08/03/2012 revealed hypermetabolic  omental/peritoneal nodules,? Prominence of the left ovary/adnexa on the CT images 2. Low abdomen/suprapubic pain-likely secondary to the marketed omentum thickening at the low abdomen/pelvis  3. Uterine fibroid  4. Chronic neck and back pain  5. Anxiety-she was given a prescription for Xanax today  Disposition:  She has multiple hypermetabolic omental and peritoneal nodules. A biopsy of the omentum on 07/24/2012 is consistent with a GYN primary. The differential diagnosis includes ovarian cancer, primary peritoneal carcinoma, or uterine cancer. I discussed the differential diagnosis with [name] [name] and her family. We will make a referral to GYN oncology to consider additional surgical debulking. I explained the standard treatment protocol is to follow surgery with systemic chemotherapy.  [name] [name] will be scheduled for an office visit in approximately 4 weeks. We will see her sooner if the GYN oncologist recommends neoadjuvant therapy or no surgery.   Thornton Papas, MD  08/07/2012  4:53 PM

## 2012-08-07 NOTE — Telephone Encounter (Signed)
gv and printed pt Dec appt schedule...Marland KitchenMarland Kitchenlvm for [facility] to make pt appt.

## 2012-08-08 ENCOUNTER — Telehealth: Payer: Self-pay | Admitting: Oncology

## 2012-08-08 NOTE — Telephone Encounter (Signed)
Kim from [place] [facility] scheduled the pt for 11.14.13 appt...i s/w the pt and advised her of this appt.

## 2012-08-16 ENCOUNTER — Encounter (HOSPITAL_COMMUNITY): Payer: Self-pay | Admitting: Pharmacy Technician

## 2012-08-16 NOTE — Progress Notes (Signed)
Need orders.

## 2012-08-17 ENCOUNTER — Telehealth: Payer: Self-pay | Admitting: Gynecologic Oncology

## 2012-08-17 ENCOUNTER — Other Ambulatory Visit: Payer: Self-pay | Admitting: Obstetrics and Gynecology

## 2012-08-17 ENCOUNTER — Encounter: Payer: Self-pay | Admitting: Gynecologic Oncology

## 2012-08-17 ENCOUNTER — Ambulatory Visit: Payer: Medicare Other | Attending: Gynecologic Oncology | Admitting: Gynecologic Oncology

## 2012-08-17 ENCOUNTER — Ambulatory Visit: Payer: Medicare Other | Admitting: Lab

## 2012-08-17 VITALS — BP 118/78 | HR 78 | Temp 98.0°F | Resp 16 | Ht 65.55 in | Wt 146.6 lb

## 2012-08-17 DIAGNOSIS — R350 Frequency of micturition: Secondary | ICD-10-CM

## 2012-08-17 DIAGNOSIS — C801 Malignant (primary) neoplasm, unspecified: Secondary | ICD-10-CM

## 2012-08-17 DIAGNOSIS — C482 Malignant neoplasm of peritoneum, unspecified: Secondary | ICD-10-CM | POA: Insufficient documentation

## 2012-08-17 DIAGNOSIS — Z1231 Encounter for screening mammogram for malignant neoplasm of breast: Secondary | ICD-10-CM

## 2012-08-17 LAB — URINALYSIS, MICROSCOPIC - CHCC
Bilirubin (Urine): NEGATIVE
Ketones: NEGATIVE mg/dL
Specific Gravity, Urine: 1.03 (ref 1.003–1.035)
pH: 5 (ref 4.6–8.0)

## 2012-08-17 NOTE — Progress Notes (Signed)
Consult Note: Gyn-Onc  [name] [name] 61 y.o. female  CC:  Chief Complaint  Patient presents with  . Adenocarcinoma    New patient    HPI: Patient is seen today in consultation at the request of Dr. Truett Perna for new diagnosis of primary peritoneal carcinoma.  Patient is a 61 year old gravida 2 para 2 who went through menopause at the age about 61 years of age. She never took any hormone replacement therapy. She states that for the past 6 months she's experienced some pelvic soreness in pressure. She has noted some increasing urinary frequency. She has seen her primary care physician Dr. name gallant occasionally she's had urinary tract infections and occasionally not. She states that when she lays down she voids about every 2 hours and this has been going on for about one years time. She otherwise denies any change in her bowel or bladder habits. She states she feels pressure deep in the pelvis and that sometimes the  pressure is better with voiding sometimes not. She denies any nausea or vomiting. She denies any shortness of breath. She states her weight is stable. She states she has early satiety but then states that when she eats too much she feels full. She states that she eats more than she thinks she should.  These symptoms she had a PET scan on October 9. It revealed significant soft tissue taking seen throughout the greater omentum suspicious for intraperitoneal carcinoma. There is minimal ascites in the pelvis. She diverticulosis of the sigmoid colon. There is no evidence of soft tissue masses along the small or large bowel. The adnexa were unremarkable in appearance. Should a small uterine fibroid. There are no other soft tissue masses identified within the abdomen or pelvis. There is no evidence of lymphadenopathy. She had a PET scan performed on October 31 it revealed the areas of omental and peritoneal nodularity were hypermetabolic. There is a dominant pelvic omental mass measuring 10.4  x 3.3 cm. There is a small omental implants in the left side of the abdomen measuring 1.1 cm. There were multiple pelvic foci  That wereperitoneal based  And hypermetabolic. These included the cul-de-sac. She has small volume of ascites. There is no supradiaphragmatic disease. She underwent a CT-guided biopsy on October 21. It revealed metastatic carcinoma. By immunohistochemistry, malignant cells were positive for WT 1, cytokeratin 7, estrogen receptor. There were negative for TTF-1, progesterone receptor, cytokeratin 20, CEA, CD X2, and S100. Overall, the histologic features were highly suggestive of a primary gynecologic origin. Her CA 125 on October 10 was elevated at 69.  Interval History:   Review of Systems: Review of systems is as above. She has a chest pain shortness of breath nausea vomiting fevers or chills. She has some right leg numbness as a result of back surgery for 5 years ago. She has any headaches or visual changes. She is quite anxious as a result of all of this.  Current Meds:  Outpatient Encounter Prescriptions as of 08/17/2012  Medication Sig Dispense Refill  . ALPRAZolam (XANAX) 0.5 MG tablet Take 1 tablet (0.5 mg total) by mouth every 8 (eight) hours as needed for sleep or anxiety.  30 tablet  0  . calcium-vitamin D (OSCAL WITH D) 500-200 MG-UNIT per tablet Take 1 tablet by mouth daily.        . fish oil-omega-3 fatty acids 1000 MG capsule Take 1 g by mouth daily.        . Flaxseed, Linseed, (FLAX SEED OIL) 1000  MG CAPS Take 1 capsule by mouth daily.      . Multiple Vitamin (MULTIVITAMIN WITH MINERALS) TABS Take 1 tablet by mouth daily.      . pravastatin (PRAVACHOL) 40 MG tablet Take 40 mg by mouth daily.        Allergy:  Allergies  Allergen Reactions  . Atorvastatin     REACTION: causes leg pain  . Decongest-Aid (Pseudoephedrine)     Makes my heart flutter  . Macrodantin     Social Hx:  2 adult children ages 36 and 8. History   Social History  . Marital  Status: Married    Spouse Name: N/A    Number of Children: N/A  . Years of Education: N/A   Occupational History  . Not on file.   Social History Main Topics  . Smoking status: Never Smoker   . Smokeless tobacco: Not on file  . Alcohol Use: No  . Drug Use: No  . Sexually Active: Yes    Birth Control/ Protection: Post-menopausal, Surgical   Other Topics Concern  . Not on file   Social History Narrative  . No narrative on file    Past Surgical Hx:  Past Surgical History  Procedure Date  . Hand surgery 1996  . Tubal ligation 1980  . Hysteroscopy 2004    D&C  . Dilation and curettage of uterus 2004    WITH HYSTEROSCOPY  . Neck surgery 2009    SPURS  . Cyst on neck 2011    Past Medical Hx: Dear -2012, mammogram negative December 2012, colonoscopy 7 years ago unremarkable. Past Medical History  Diagnosis Date  . Rheumatoid arthritis   . Endometrial polyp   . Cystocele with uterine descensus     AND RECTOCELE  . Elevated cholesterol     Family Hx: Maternal grandfather with throat cancer. Father with bladder cancer. Family History  Problem Relation Age of Onset  . Heart disease Mother   . Osteoporosis Sister     Vitals:  Blood pressure 118/78, pulse 78, temperature 98 F (36.7 C), resp. rate 16, height 5' 5.55" (1.665 m), weight 146 lb 9.6 oz (66.497 kg).  Physical Exam:  Nourished well developed female in no acute distress.  Neck: Supple, no lymphadenopathy, no thyromegaly.  Lungs: Clear to auscultation bilaterally.  Her breasts are: Regular rate and rhythm.  Breasts: Symmetrical, no masses, no nodularity. No skin changes. No nipple discharge. No axillary adenopathy.  Abdomen: Soft. Minimally tender in the deep lower quadrants. Positive fluid wave. Fullness in the mid lower abdomen no definitive mass.  Groins: No lymphadenopathy.  Extremities: No edema.  Pelvic: Normal external female genitalia. Prominent rectocele. Vagina is markedly atrophic. She  is evidence of cystocele as well as bilateral lateral wall defects on speculum examination. The cervix is unremarkable. Rectal examination reveals a large rectocele. There is no definitive a large abdominal pelvic mass. On rectovaginal examination there is some nodularity in the posterior cul-de-sac that is nontender. Nodularity. Measured 2 x 3 cm. Assessment/Plan: 61 year old with primary peritoneal carcinoma. Based on imaging probably a stage IIIc. Looking at her imaging I do believe that an attempt at a debulking would be reasonable. I discussed with the patient that we would proceed with exploratory laparotomy. She would undergo a Total abdominal hysterectomy, BSO, omentectomy and again an attempt at optimal debulking. She understands that this may or may not include any portion of bowel surgery. She also understands that this point we will not be addressing her  pelvic organ relaxation. We will proceed with the cancer related surgery first she will need to follow with chemotherapy and once she has recovered from all of this if at that point she wishes to referral to urogynecology we would be happy to coordinate that for her.  Recent surgery including bleeding infection injury to surrounding organs and thromboembolic disease were discussed the patient. She understands the risks could occur and that this is not a comprehensive list. She is tentatively scheduled for surgery on November 19 with Dr. Loree Fee. Her questions as well as those of her sister were elicited in answer to her satisfaction.  She scheduled for preop visit tomorrow. She is concerned she may have been a urinary tract infection would like for [place] to check a urinalysis today. Her last CA 125 was 63 on October 10. We will therefore check another CA 125 at the time of her preop visit. She has our card and knows to contact us if she has any questions prior to her surgical date.  Ahmia Colford A., MD 08/17/2012, 9:36 AM

## 2012-08-17 NOTE — Patient Instructions (Signed)
Keep appointments for pre-care tomorrow.

## 2012-08-17 NOTE — Telephone Encounter (Signed)
Pt informed of UA results.  No questions or concerns voiced.  Instructed to call for any needs.

## 2012-08-18 ENCOUNTER — Encounter (HOSPITAL_COMMUNITY)
Admission: RE | Admit: 2012-08-18 | Discharge: 2012-08-18 | Disposition: A | Discharge status: Home / Self Care | Payer: Medicare Other | Source: Ambulatory Visit | Attending: Obstetrics & Gynecology | Admitting: Obstetrics & Gynecology

## 2012-08-18 ENCOUNTER — Other Ambulatory Visit: Payer: Self-pay | Admitting: Gynecologic Oncology

## 2012-08-18 ENCOUNTER — Encounter (HOSPITAL_COMMUNITY): Payer: Self-pay

## 2012-08-18 ENCOUNTER — Ambulatory Visit (HOSPITAL_COMMUNITY)
Admission: RE | Admit: 2012-08-18 | Discharge: 2012-08-18 | Disposition: A | Discharge status: Home / Self Care | Payer: Medicare Other | Source: Ambulatory Visit | Attending: Gynecology | Admitting: Gynecology

## 2012-08-18 DIAGNOSIS — C482 Malignant neoplasm of peritoneum, unspecified: Secondary | ICD-10-CM

## 2012-08-18 DIAGNOSIS — Z01810 Encounter for preprocedural cardiovascular examination: Secondary | ICD-10-CM | POA: Insufficient documentation

## 2012-08-18 DIAGNOSIS — C481 Malignant neoplasm of specified parts of peritoneum: Secondary | ICD-10-CM | POA: Insufficient documentation

## 2012-08-18 DIAGNOSIS — R1909 Other intra-abdominal and pelvic swelling, mass and lump: Secondary | ICD-10-CM

## 2012-08-18 DIAGNOSIS — K668 Other specified disorders of peritoneum: Secondary | ICD-10-CM | POA: Insufficient documentation

## 2012-08-18 DIAGNOSIS — Z01812 Encounter for preprocedural laboratory examination: Secondary | ICD-10-CM | POA: Insufficient documentation

## 2012-08-18 LAB — COMPREHENSIVE METABOLIC PANEL
ALT: 22 U/L (ref 0–35)
AST: 24 U/L (ref 0–37)
CO2: 28 mEq/L (ref 19–32)
Calcium: 9.6 mg/dL (ref 8.4–10.5)
GFR calc non Af Amer: >90 mL/min (ref >90)
Sodium: 140 mEq/L (ref 135–145)
Total Protein: 7.2 g/dL (ref 6.0–8.3)

## 2012-08-18 LAB — CBC WITH DIFFERENTIAL/PLATELET
Basophils Absolute: 0 10*3/uL (ref 0.0–0.1)
Eosinophils Relative: 1 % (ref 0–5)
Lymphocytes Relative: 11 % — ABNORMAL LOW (ref 12–46)
MCV: 97.4 fL (ref 78.0–100.0)
Neutro Abs: 8.3 10*3/uL — ABNORMAL HIGH (ref 1.7–7.7)
Platelets: 327 10*3/uL (ref 150–400)
RDW: 13 % (ref 11.5–15.5)
WBC: 10.4 10*3/uL (ref 4.0–10.5)

## 2012-08-18 LAB — SURGICAL PCR SCREEN: MRSA, PCR: NEGATIVE

## 2012-08-18 LAB — CA 125: CA 125: 62.1 U/mL — ABNORMAL HIGH (ref 0.0–30.2)

## 2012-08-18 NOTE — Pre-Procedure Instructions (Signed)
08-18-12 EKG and CXR done per MD order.

## 2012-08-18 NOTE — Patient Instructions (Addendum)
20 [name] [name]  08/18/2012   Your procedure is scheduled on: 11-19  -2013  Report to [facility] at  1000      AM.  Call this number if you have problems the morning of surgery: [phone]  Or Presurgical Testing [phone](Lauralyn Shadowens)   Remember:   Do not eat food :After Midnight.  Follow bowel prep and  clear liquids day before as instructed per office. May have Clear liquids until 0600 AM 08-22-12, then nothing.  Clear liquids include soda, tea, black coffee, apple or grape juice, broth.  Take these medicines the morning of surgery with A SIP OF WATER: Xanax if needed  Do not wear jewelry, make-up or nail polish.  Do not wear lotions, powders, or perfumes. You may wear deodorant.  Do not shave 48 hours prior to surgery.(face and neck okay, no shaving of legs)  Do not bring valuables to the hospital.  Contacts, dentures or bridgework may not be worn into surgery.  Leave suitcase in the car. After surgery it may be brought to your room.  For patients admitted to the hospital, checkout time is 11:00 AM the day of discharge.   Patients discharged the day of surgery will not be allowed to drive home. Must have responsible person with you x 24 hours once discharged.  Name and phone number of your driver: [name] [name], spouse [phone]  Special Instructions: CHG Shower Use Special Wash:see special soap instruction sheet.(avoid face and genitals)   Please read over the following fact sheets that you were given: MRSA Information, Blood Transfusion fact sheet, Incentive Spirometry Instruction.

## 2012-08-22 ENCOUNTER — Ambulatory Visit (HOSPITAL_COMMUNITY): Payer: Medicare Other | Admitting: *Deleted

## 2012-08-22 ENCOUNTER — Encounter (HOSPITAL_COMMUNITY)
Admission: RE | Disposition: A | Discharge status: Home / Self Care | Payer: Self-pay | Source: Ambulatory Visit | Attending: Obstetrics & Gynecology

## 2012-08-22 ENCOUNTER — Inpatient Hospital Stay (HOSPITAL_COMMUNITY)
Admission: RE | Admit: 2012-08-22 | Discharge: 2012-08-27 | DRG: 737 | Disposition: A | Discharge status: Home / Self Care | Payer: Medicare Other | Source: Ambulatory Visit | Attending: Obstetrics & Gynecology | Admitting: Obstetrics & Gynecology

## 2012-08-22 ENCOUNTER — Encounter (HOSPITAL_COMMUNITY): Payer: Self-pay | Admitting: *Deleted

## 2012-08-22 DIAGNOSIS — C569 Malignant neoplasm of unspecified ovary: Secondary | ICD-10-CM | POA: Diagnosis present

## 2012-08-22 DIAGNOSIS — K668 Other specified disorders of peritoneum: Secondary | ICD-10-CM

## 2012-08-22 DIAGNOSIS — C785 Secondary malignant neoplasm of large intestine and rectum: Secondary | ICD-10-CM | POA: Diagnosis present

## 2012-08-22 DIAGNOSIS — C7919 Secondary malignant neoplasm of other urinary organs: Secondary | ICD-10-CM | POA: Diagnosis present

## 2012-08-22 DIAGNOSIS — N2889 Other specified disorders of kidney and ureter: Secondary | ICD-10-CM | POA: Diagnosis present

## 2012-08-22 DIAGNOSIS — F411 Generalized anxiety disorder: Secondary | ICD-10-CM | POA: Diagnosis present

## 2012-08-22 DIAGNOSIS — C779 Secondary and unspecified malignant neoplasm of lymph node, unspecified: Secondary | ICD-10-CM | POA: Diagnosis present

## 2012-08-22 DIAGNOSIS — K660 Peritoneal adhesions (postprocedural) (postinfection): Secondary | ICD-10-CM | POA: Diagnosis present

## 2012-08-22 DIAGNOSIS — M069 Rheumatoid arthritis, unspecified: Secondary | ICD-10-CM | POA: Diagnosis present

## 2012-08-22 DIAGNOSIS — C50919 Malignant neoplasm of unspecified site of unspecified female breast: Secondary | ICD-10-CM | POA: Diagnosis present

## 2012-08-22 DIAGNOSIS — C786 Secondary malignant neoplasm of retroperitoneum and peritoneum: Secondary | ICD-10-CM | POA: Diagnosis present

## 2012-08-22 HISTORY — PX: OMENTECTOMY: SHX5985

## 2012-08-22 HISTORY — PX: ABDOMINAL HYSTERECTOMY: SHX81

## 2012-08-22 HISTORY — PX: SALPINGOOPHORECTOMY: SHX82

## 2012-08-22 HISTORY — PX: COLOSTOMY REVISION: SHX5232

## 2012-08-22 HISTORY — PX: DEBULKING: SHX6277

## 2012-08-22 LAB — TYPE AND SCREEN

## 2012-08-22 SURGERY — HYSTERECTOMY, ABDOMINAL
Anesthesia: General | Wound class: Clean Contaminated

## 2012-08-22 MED ORDER — EPHEDRINE SULFATE 50 MG/ML IJ SOLN
INTRAMUSCULAR | Status: DC | PRN
  Administered 2012-08-22: 10 mg via INTRAVENOUS

## 2012-08-22 MED ORDER — SODIUM CHLORIDE 0.9 % IV SOLN
1.0000 g | INTRAVENOUS | Status: DC | PRN
  Administered 2012-08-22: 1 g via INTRAVENOUS

## 2012-08-22 MED ORDER — SUFENTANIL CITRATE 50 MCG/ML IV SOLN
INTRAVENOUS | Status: DC | PRN
  Administered 2012-08-22 (×2): 10 ug via INTRAVENOUS
  Administered 2012-08-22: 5 ug via INTRAVENOUS
  Administered 2012-08-22: 10 ug via INTRAVENOUS
  Administered 2012-08-22: 5 ug via INTRAVENOUS
  Administered 2012-08-22: 10 ug via INTRAVENOUS

## 2012-08-22 MED ORDER — DIPHENHYDRAMINE HCL 50 MG/ML IJ SOLN: 12.5000 mg | Freq: Four times a day (QID) | INTRAMUSCULAR | Status: DC | PRN

## 2012-08-22 MED ORDER — CISATRACURIUM BESYLATE (PF) 10 MG/5ML IV SOLN
INTRAVENOUS | Status: DC | PRN
  Administered 2012-08-22: 8 mg via INTRAVENOUS
  Administered 2012-08-22: 2 mg via INTRAVENOUS
  Administered 2012-08-22: 4 mg via INTRAVENOUS
  Administered 2012-08-22 (×3): 2 mg via INTRAVENOUS

## 2012-08-22 MED ORDER — NEOSTIGMINE METHYLSULFATE 1 MG/ML IJ SOLN
INTRAMUSCULAR | Status: DC | PRN
  Administered 2012-08-22: 3 mg via INTRAVENOUS

## 2012-08-22 MED ORDER — KCL IN DEXTROSE-NACL 20-5-0.45 MEQ/L-%-% IV SOLN
INTRAVENOUS | Status: DC
  Administered 2012-08-22: 18:00:00 via INTRAVENOUS
  Administered 2012-08-23: 125 mL via INTRAVENOUS
  Administered 2012-08-23 (×2): via INTRAVENOUS
  Filled 2012-08-22 (×5): qty 1000

## 2012-08-22 MED ORDER — ZOLPIDEM TARTRATE 5 MG PO TABS
5.0000 mg | ORAL_TABLET | Freq: Every evening | ORAL | Status: DC | PRN
  Administered 2012-08-23 – 2012-08-24 (×2): 5 mg via ORAL
  Filled 2012-08-22 (×2): qty 1

## 2012-08-22 MED ORDER — ONDANSETRON HCL 4 MG PO TABS: 4.0000 mg | ORAL_TABLET | Freq: Four times a day (QID) | ORAL | Status: DC | PRN

## 2012-08-22 MED ORDER — HYDROMORPHONE 0.3 MG/ML IV SOLN
INTRAVENOUS | Status: DC
  Administered 2012-08-22: 17:00:00 via INTRAVENOUS
  Administered 2012-08-23 (×2): 0.3 mg via INTRAVENOUS

## 2012-08-22 MED ORDER — LACTATED RINGERS IV SOLN
INTRAVENOUS | Status: DC | PRN
  Administered 2012-08-22: 15:00:00 via INTRAVENOUS

## 2012-08-22 MED ORDER — ENOXAPARIN SODIUM 40 MG/0.4ML ~~LOC~~ SOLN
40.0000 mg | SUBCUTANEOUS | Status: AC
  Administered 2012-08-22: 40 mg via SUBCUTANEOUS
  Filled 2012-08-22: qty 0.4

## 2012-08-22 MED ORDER — CEFAZOLIN SODIUM-DEXTROSE 2-3 GM-% IV SOLR
2.0000 g | INTRAVENOUS | Status: AC
  Administered 2012-08-22: 2 g via INTRAVENOUS

## 2012-08-22 MED ORDER — KETOROLAC TROMETHAMINE 30 MG/ML IJ SOLN
30.0000 mg | Freq: Four times a day (QID) | INTRAMUSCULAR | Status: AC
  Administered 2012-08-22 – 2012-08-23 (×3): 30 mg via INTRAVENOUS
  Filled 2012-08-22 (×2): qty 1

## 2012-08-22 MED ORDER — LACTATED RINGERS IV SOLN
INTRAVENOUS | Status: DC | PRN
  Administered 2012-08-22 (×4): via INTRAVENOUS

## 2012-08-22 MED ORDER — SODIUM CHLORIDE 0.9 % IV SOLN
INTRAVENOUS | Status: AC
  Filled 2012-08-22: qty 1

## 2012-08-22 MED ORDER — HYDROMORPHONE HCL PF 1 MG/ML IJ SOLN
0.2500 mg | INTRAMUSCULAR | Status: DC | PRN
  Administered 2012-08-22 (×3): 0.5 mg via INTRAVENOUS

## 2012-08-22 MED ORDER — BUPIVACAINE LIPOSOME 1.3 % IJ SUSP
20.0000 mL | INTRAMUSCULAR | Status: AC
  Administered 2012-08-22: 40 mL
  Filled 2012-08-22: qty 20

## 2012-08-22 MED ORDER — OXYCODONE-ACETAMINOPHEN 5-325 MG PO TABS
1.0000 | ORAL_TABLET | ORAL | Status: DC | PRN
  Administered 2012-08-23: 1 via ORAL
  Administered 2012-08-24 – 2012-08-26 (×11): 2 via ORAL
  Administered 2012-08-27: 1 via ORAL
  Administered 2012-08-27: 2 via ORAL
  Administered 2012-08-27: 1 via ORAL
  Filled 2012-08-22 (×5): qty 2
  Filled 2012-08-22: qty 1
  Filled 2012-08-22: qty 2
  Filled 2012-08-22: qty 1
  Filled 2012-08-22 (×3): qty 2
  Filled 2012-08-22: qty 1
  Filled 2012-08-22 (×3): qty 2

## 2012-08-22 MED ORDER — ACETAMINOPHEN 10 MG/ML IV SOLN
INTRAVENOUS | Status: DC | PRN
  Administered 2012-08-22: 1000 mg via INTRAVENOUS

## 2012-08-22 MED ORDER — NALOXONE HCL 0.4 MG/ML IJ SOLN: 0.4000 mg | INTRAMUSCULAR | Status: DC | PRN

## 2012-08-22 MED ORDER — HYDROMORPHONE HCL PF 1 MG/ML IJ SOLN
INTRAMUSCULAR | Status: DC | PRN
  Administered 2012-08-22 (×2): 1 mg via INTRAVENOUS

## 2012-08-22 MED ORDER — ONDANSETRON HCL 4 MG/2ML IJ SOLN: 4.0000 mg | Freq: Four times a day (QID) | INTRAMUSCULAR | Status: DC | PRN

## 2012-08-22 MED ORDER — 0.9 % SODIUM CHLORIDE (POUR BTL) OPTIME
TOPICAL | Status: DC | PRN
  Administered 2012-08-22: 2000 mL

## 2012-08-22 MED ORDER — LACTATED RINGERS IV SOLN
INTRAVENOUS | Status: DC
  Administered 2012-08-22: 1000 mL via INTRAVENOUS

## 2012-08-22 MED ORDER — HYDROMORPHONE 0.3 MG/ML IV SOLN
INTRAVENOUS | Status: AC
  Filled 2012-08-22: qty 25

## 2012-08-22 MED ORDER — LORAZEPAM 2 MG/ML IJ SOLN: 0.5000 mg | Freq: Four times a day (QID) | INTRAMUSCULAR | Status: DC | PRN

## 2012-08-22 MED ORDER — SODIUM CHLORIDE 0.9 % IJ SOLN: 9.0000 mL | INTRAMUSCULAR | Status: DC | PRN

## 2012-08-22 MED ORDER — DEXAMETHASONE SODIUM PHOSPHATE 10 MG/ML IJ SOLN
INTRAMUSCULAR | Status: DC | PRN
  Administered 2012-08-22: 10 mg via INTRAVENOUS

## 2012-08-22 MED ORDER — GLYCOPYRROLATE 0.2 MG/ML IJ SOLN
INTRAMUSCULAR | Status: DC | PRN
  Administered 2012-08-22: .4 mg via INTRAVENOUS
  Administered 2012-08-22: 0.2 mg via INTRAVENOUS

## 2012-08-22 MED ORDER — LACTATED RINGERS IV SOLN: INTRAVENOUS | Status: DC

## 2012-08-22 MED ORDER — HYDROMORPHONE HCL PF 1 MG/ML IJ SOLN
INTRAMUSCULAR | Status: AC
  Filled 2012-08-22: qty 1

## 2012-08-22 MED ORDER — MIDAZOLAM HCL 5 MG/5ML IJ SOLN
INTRAMUSCULAR | Status: DC | PRN
  Administered 2012-08-22 (×2): 2 mg via INTRAVENOUS

## 2012-08-22 MED ORDER — ONDANSETRON HCL 4 MG/2ML IJ SOLN
INTRAMUSCULAR | Status: DC | PRN
  Administered 2012-08-22 (×2): 2 mg via INTRAVENOUS

## 2012-08-22 MED ORDER — DIPHENHYDRAMINE HCL 12.5 MG/5ML PO ELIX: 12.5000 mg | ORAL_SOLUTION | Freq: Four times a day (QID) | ORAL | Status: DC | PRN

## 2012-08-22 MED ORDER — HEPARIN SODIUM (PORCINE) 1000 UNIT/ML IJ SOLN
INTRAMUSCULAR | Status: AC
  Filled 2012-08-22: qty 1

## 2012-08-22 MED ORDER — LIDOCAINE HCL (CARDIAC) 20 MG/ML IV SOLN
INTRAVENOUS | Status: DC | PRN
  Administered 2012-08-22: 75 mg via INTRAVENOUS

## 2012-08-22 MED ORDER — PROMETHAZINE HCL 25 MG/ML IJ SOLN: 6.2500 mg | INTRAMUSCULAR | Status: DC | PRN

## 2012-08-22 MED ORDER — ACETAMINOPHEN 10 MG/ML IV SOLN
INTRAVENOUS | Status: AC
  Filled 2012-08-22: qty 100

## 2012-08-22 MED ORDER — KETOROLAC TROMETHAMINE 30 MG/ML IJ SOLN
30.0000 mg | Freq: Four times a day (QID) | INTRAMUSCULAR | Status: AC
  Filled 2012-08-22: qty 1

## 2012-08-22 MED ORDER — CEFAZOLIN SODIUM-DEXTROSE 2-3 GM-% IV SOLR
INTRAVENOUS | Status: AC
  Filled 2012-08-22: qty 50

## 2012-08-22 MED ORDER — PROPOFOL 10 MG/ML IV EMUL
INTRAVENOUS | Status: DC | PRN
  Administered 2012-08-22: 200 mg via INTRAVENOUS

## 2012-08-22 MED ORDER — SUCCINYLCHOLINE CHLORIDE 20 MG/ML IJ SOLN
INTRAMUSCULAR | Status: DC | PRN
  Administered 2012-08-22: 100 mg via INTRAVENOUS

## 2012-08-22 SURGICAL SUPPLY — 69 items
ATTRACTOMAT 16X20 MAGNETIC DRP (DRAPES) ×4 IMPLANT
BAG URINE DRAINAGE (UROLOGICAL SUPPLIES) ×3 IMPLANT
BLADE EXTENDED COATED 6.5IN (ELECTRODE) ×4 IMPLANT
CANISTER SUCTION 2500CC (MISCELLANEOUS) ×4 IMPLANT
CATH FOLEY 2WAY SLVR  5CC 16FR (CATHETERS)
CATH FOLEY 2WAY SLVR 5CC 16FR (CATHETERS) ×3 IMPLANT
CHLORAPREP W/TINT 10.5 ML (MISCELLANEOUS) ×4 IMPLANT
CLIP TI MEDIUM LARGE 6 (CLIP) ×18 IMPLANT
CLOTH BEACON ORANGE TIMEOUT ST (SAFETY) ×4 IMPLANT
COVER SURGICAL LIGHT HANDLE (MISCELLANEOUS) ×4 IMPLANT
DRAIN CHANNEL RND F F (WOUND CARE) ×1 IMPLANT
DRAPE TABLE BACK 44X90 PK DISP (DRAPES) ×4 IMPLANT
DRAPE UTILITY 15X26 (DRAPE) ×4 IMPLANT
DRAPE UTILITY XL STRL (DRAPES) ×4 IMPLANT
DRAPE WARM FLUID 44X44 (DRAPE) ×4 IMPLANT
DRSG TELFA 4X14 ISLAND ADH (GAUZE/BANDAGES/DRESSINGS) ×1 IMPLANT
ELECT BLADE 6.5 EXT (BLADE) ×3 IMPLANT
ELECT REM PT RETURN 9FT ADLT (ELECTROSURGICAL) ×4
ELECTRODE REM PT RTRN 9FT ADLT (ELECTROSURGICAL) ×3 IMPLANT
EVACUATOR SILICONE 100CC (DRAIN) ×1 IMPLANT
GAUZE SPONGE 4X4 16PLY XRAY LF (GAUZE/BANDAGES/DRESSINGS) ×4 IMPLANT
GLOVE BIO SURGEON STRL SZ 6.5 (GLOVE) ×4 IMPLANT
GLOVE BIO SURGEON STRL SZ7.5 (GLOVE) ×12 IMPLANT
GLOVE BIOGEL M STRL SZ7.5 (GLOVE) ×20 IMPLANT
GLOVE BIOGEL PI IND STRL 7.0 (GLOVE) ×3 IMPLANT
GLOVE BIOGEL PI INDICATOR 7.0 (GLOVE) ×1
GOWN PREVENTION PLUS LG XLONG (DISPOSABLE) ×4 IMPLANT
GOWN PREVENTION PLUS XLARGE (GOWN DISPOSABLE) ×4 IMPLANT
GOWN STRL NON-REIN LRG LVL3 (GOWN DISPOSABLE) ×6 IMPLANT
GOWN STRL REIN XL XLG (GOWN DISPOSABLE) ×4 IMPLANT
HOLDER FOLEY CATH W/STRAP (MISCELLANEOUS) ×4 IMPLANT
LIGASURE IMPACT 36 18CM CVD LR (INSTRUMENTS) ×1 IMPLANT
NDL HYPO 25X1 1.5 SAFETY (NEEDLE) ×3 IMPLANT
NEEDLE HYPO 25X1 1.5 SAFETY (NEEDLE) ×4 IMPLANT
NS IRRIG 1000ML POUR BTL (IV SOLUTION) ×12 IMPLANT
PACK ABDOMINAL WL (CUSTOM PROCEDURE TRAY) ×4 IMPLANT
SEALER TISSUE X1 CVD JAW (INSTRUMENTS) IMPLANT
SHEET LAVH (DRAPES) ×4 IMPLANT
SPONGE GAUZE 4X4 12PLY (GAUZE/BANDAGES/DRESSINGS) ×1 IMPLANT
SPONGE LAP 18X18 X RAY DECT (DISPOSABLE) ×7 IMPLANT
STAPLER CIRC CVD 29MM 37CM (STAPLE) ×1 IMPLANT
STAPLER CUT CVD 40MM GREEN (STAPLE) ×1 IMPLANT
STAPLER PROXIMATE 75MM BLUE (STAPLE) ×1 IMPLANT
STAPLER SKIN PROX WIDE 3.9 (STAPLE) ×4 IMPLANT
STAPLER VISISTAT 35W (STAPLE) ×4 IMPLANT
SURGIFLO W/THROMBIN 8M KIT (HEMOSTASIS) ×1 IMPLANT
SUT ETHILON 1 LR 30 (SUTURE) IMPLANT
SUT PDS AB 1 CTXB1 36 (SUTURE) ×8 IMPLANT
SUT PROLENE 2 0 BLUE (SUTURE) ×1 IMPLANT
SUT SILK 2 0 (SUTURE) ×4
SUT SILK 2 0 30  PSL (SUTURE)
SUT SILK 2 0 30 PSL (SUTURE) IMPLANT
SUT SILK 2 0 SH CR/8 (SUTURE) ×1 IMPLANT
SUT SILK 2-0 18XBRD TIE 12 (SUTURE) ×3 IMPLANT
SUT SILK 3 0 SH CR/8 (SUTURE) ×1 IMPLANT
SUT VIC AB 0 CT1 36 (SUTURE) ×27 IMPLANT
SUT VIC AB 2-0 CT2 27 (SUTURE) ×33 IMPLANT
SUT VIC AB 2-0 SH 27 (SUTURE) ×8
SUT VIC AB 2-0 SH 27X BRD (SUTURE) ×18 IMPLANT
SUT VIC AB 3-0 CTX 36 (SUTURE) IMPLANT
SUT VICRYL 2 0 18  UND BR (SUTURE) ×1
SUT VICRYL 2 0 18 UND BR (SUTURE) ×3 IMPLANT
SYR 20CC LL (SYRINGE) ×1 IMPLANT
SYR CONTROL 10ML LL (SYRINGE) ×3 IMPLANT
TAPE CLOTH SURG 4X10 WHT LF (GAUZE/BANDAGES/DRESSINGS) ×1 IMPLANT
TOWEL OR 17X26 10 PK STRL BLUE (TOWEL DISPOSABLE) ×6 IMPLANT
TOWEL OR NON WOVEN STRL DISP B (DISPOSABLE) ×4 IMPLANT
TRAY FOLEY CATH 14FRSI W/METER (CATHETERS) ×4 IMPLANT
WATER STERILE IRR 1500ML POUR (IV SOLUTION) ×3 IMPLANT

## 2012-08-22 NOTE — Preoperative (Signed)
Beta Blockers   Reason not to administer Beta Blockers:Not Applicable 

## 2012-08-22 NOTE — Op Note (Signed)
[name] [name]  female MEDICAL RECORD ZO:[id number] DATE OF BIRTH: Feb 15, 1951 PHYSICIAN: De Blanch, M.D  DATE OF PROCEDURE: 07/22/2012   OPERATIVE REPORT  PREOPERATIVE DIAGNOSIS: Adenocarcinoma probable of ovarian origin  POSTOPERATIVE DIAGNOSIS: Stage III C. ovarian cancer (optimally debulked), retroperitoneal fibrosis PROCEDURE: Radical debulking of ovarian cancer including: Total abdominal hysterectomy, bilateral salpingo-oophorectomy, ureteral lysis, omentectomy, stripping of pelvic peritoneum, omentectomy, and rectosigmoid resection with low rectal anastomosis.  SURGEON: De Blanch, M.D ASSISTANT: Antionette Char M.D., Telford Nab RN, ANESTHESIA: Gen. with oral tracheal tube ESTIMATED BLOOD LOSS: 700 mL  SURGICAL FINDINGS: At exploratory laparotomy the infracolic omentum was nearly replaced with tumor. This measured approximately 12 x 16 cm. There were 5 mm nodules on the right diaphragm. The small bowel serosa and mesentery were normal. In the pelvis it appeared that the left ovary was a primary source. It was cystic and densely adherent to the sigmoid mesentery. The right ovary was approximately 4 cm in diameter and had multiple excrescences on it. There were extensive tumor implants on the bladder flap and involving the sigmoid mesentery and serosa, and posterior cul-de-sac. At the conclusion of the surgical procedure the only residual disease were 5 mm nodules on the right diaphragm.  PROCEDURE: the patent was takeni to the operating room and after satisfactory attainment of general anesthesia was placed in a modified lithotomy position in [place] stirrups. The anterior abdominal wall perineum and vagina were prepped, a Foley catheter was placed, and the patient was draped. Surgical timeout was taken. The abdomen was entered through midline incision which extended above the umbilicus. The pelvis and abdomen were explored with the above-noted findings. A  Bookwalter retractor was positioned and the upper abdomen exposed. Attention was initially turned to the omentum. The omentum was detached from its attachments to the transverse colon. The lesser sac was opened. Vascular pedicles were controlled with the LigaSure. The left colon was mobilized across the splenic flexure in order to avoid injury to the spleen. All gross omental disease was resected and sent to pathology.  Attention was then turned to the pelvis. The Bookwalter retractor was repositioned and small bowel packed out of the pelvis. In order to obtain mobility of the sigmoid colon which was densely adherent to the left pelvic sidewall the peritoneum along the left pelvic sidewall was incised and the retroperitoneal space opened. The external iliac artery, internal iliac artery and ureter were identified. The ovarian vessels were identified, isolated, clamped cut free tied and suture ligated with 2-0 Vicryl. Using the LigaSure we began to mobilize the ovarian tumor which was invading the sigmoid mesentery.  In order to gain further mobility of the right pelvic sidewall was opened the vessels and ureter identified and the ovarian vessels isolated. They were clamped cut free tied and suture-ligated. The broad ligament was incised on the right side until the tube and ovary were mobilized. These were crossclamped and the uterine cornu divided  and submitted to pathology for permanent section. Cancer along the bladder flap was identified. We made a peritoneal incision beyond the cancer and then undermined the peritoneum nor to identify the cervix. The cervix and below that her bladder were then separated with blunt and sharp dissection. The uterine vessels were then skeletonized clamped cut and suture ligated. In a stepwise fashion the paracervical and cardinal ligaments were clamped cut and suture ligated. The vaginal angle was encountered. It was crossclamped and the vagina transected from its connection  to the cervix. Vaginal angles were transfixed with  0 Vicryl the central portion of vagina closed with interrupted figure-of-eight sutures of 0 Vicryl. Assessing the pelvis we identified a significant amount of cancer involving the sigmoid mesentery and serosa as well as the peritoneum in the cul-de-sac. In order to optimally debulked the patient a sigmoid resection was required.  The proximal sigmoid colon at the pelvic brim was identified isolated and then divided using a GIA stapler. The presacral spaces were opened after mobilized the ureters bilaterally ( bilateral ureteral lysis). The remainder of the sigmoid mesentery was divided with the LigaSure including the inferior mesenteric artery branch to the sigmoid colon. The rectovaginal septum was developed and using the LigaSure and Bovie cautery the fat was mobilized away from the rectum. Using the contour stapler the distal rectum was divided. Traction sutures were placed at the angles. The proximal colon was then opened and the sizers were used to identify the proper size which was a 29 mm diameter. A 29 mm EEA stapler was passed across anus and up to the rectal stump. An end to end anastomosis was then created. Upon removing the stapler to donuts were identified and sent to pathology as margins.  Proctoscopy was performed and no bleeding was noted. Air was insufflated the sigmoid colon after the sigmoid was occluded at the pelvic brim. Saline was placed in the pelvis and no bubbles were noted nor was any weakness of the bowel wall identified. A #19 Blake drain was brought through a stab incision in the right lower quadrant,securred to the skin with a silk suture and placed in the presacral space. The upper abdomen was reexplored and found to be hemostatic. There was some oozing along the vaginal cuff which was controlled using FloSeal.  The retractors and packs removed and the anterior abdominal wall closed in layers the first being a running mass  closure using #1 PDS. Subcutaneous tissue was irrigated Experil was injected in the subcutaneous layer. Skin was closed with skin staples and a dressing was applied the patient was awakened from anesthesia and taken to the recovery room in satisfactory condition sponge needle and isthmic counts correct x2    De Blanch, M.D

## 2012-08-22 NOTE — Anesthesia Postprocedure Evaluation (Signed)
Anesthesia Post Note  Patient: [name] [name]  Procedure(s) Performed: Procedure(s) (LRB): HYSTERECTOMY ABDOMINAL (N/A) SALPINGO OOPHORECTOMY (Bilateral) OMENTECTOMY (N/A) DEBULKING (N/A) COLON RESECTION SIGMOID ()  Anesthesia type: General  Patient location: PACU  Post pain: Pain level controlled  Post assessment: Post-op Vital signs reviewed  Last Vitals:  Filed Vitals:   08/22/12 1700  BP: 130/81  Pulse: 77  Temp: 37.2 C  Resp: 8    Post vital signs: Reviewed  Level of consciousness: sedated  Complications: No apparent anesthesia complications

## 2012-08-22 NOTE — Interval H&P Note (Signed)
History and Physical Interval Note:  08/22/2012 11:36 AM  [name] [name]  has presented today for surgery, with the diagnosis of OMENTAL CAKE  The various methods of treatment have been discussed with the patient and family. After consideration of risks, benefits and other options for treatment, the patient has consented to  Procedure(s) (LRB) with comments: EXPLORATORY LAPAROTOMY (N/A) - EXPLORATORY LAPAROTOMY, TAH, BSO, OMENTECTOMY AND DEBULKING HYSTERECTOMY ABDOMINAL (N/A) SALPINGO OOPHORECTOMY (Bilateral) OMENTECTOMY (N/A) DEBULKING (N/A) as a surgical intervention .  The patient's history has been reviewed, patient examined, no change in status, stable for surgery.  I have reviewed the patient's chart and labs.  Questions were answered to the patient's satisfaction.     CLARKE-PEARSON,Darneisha Windhorst L

## 2012-08-22 NOTE — Transfer of Care (Signed)
Immediate Anesthesia Transfer of Care Note  Patient: [name] [name]  Procedure(s) Performed: Procedure(s) (LRB) with comments: HYSTERECTOMY ABDOMINAL (N/A) SALPINGO OOPHORECTOMY (Bilateral) OMENTECTOMY (N/A) DEBULKING (N/A) - Radical tumor debulking, Bilateral Ureterolysis COLON RESECTION SIGMOID () - Rectal Sigmoid resection and low rectal anastomosis  Patient Location: PACU  Anesthesia Type:General  Level of Consciousness: awake, oriented, patient cooperative, lethargic and responds to stimulation  Airway & Oxygen Therapy: Patient Spontanous Breathing and Patient connected to face mask oxygen  Post-op Assessment: Report given to PACU RN, Post -op Vital signs reviewed and stable and Patient moving all extremities  Post vital signs: Reviewed and stable  Complications: No apparent anesthesia complications

## 2012-08-22 NOTE — Anesthesia Preprocedure Evaluation (Signed)
Anesthesia Evaluation  Patient identified by MRN, date of birth, ID band Patient awake    Reviewed: Allergy & Precautions, H&P , NPO status , Patient's Chart, lab work & pertinent test results  Airway Mallampati: II TM Distance: >3 FB Neck ROM: Full    Dental  (+) Teeth Intact and Dental Advisory Given   Pulmonary shortness of breath,  breath sounds clear to auscultation  Pulmonary exam normal       Cardiovascular negative cardio ROS  Rhythm:Regular Rate:Normal     Neuro/Psych Anxiety negative neurological ROS     GI/Hepatic negative GI ROS, Neg liver ROS,   Endo/Other  negative endocrine ROS  Renal/GU negative Renal ROS  negative genitourinary   Musculoskeletal  (+) Arthritis -, Rheumatoid disorders,    Abdominal   Peds negative pediatric ROS (+)  Hematology negative hematology ROS (+)   Anesthesia Other Findings   Reproductive/Obstetrics negative OB ROS                           Anesthesia Physical Anesthesia Plan  ASA: II  Anesthesia Plan: General   Post-op Pain Management:    Induction: Intravenous  Airway Management Planned: Oral ETT  Additional Equipment:   Intra-op Plan:   Post-operative Plan: Extubation in OR  Informed Consent: I have reviewed the patients History and Physical, chart, labs and discussed the procedure including the risks, benefits and alternatives for the proposed anesthesia with the patient or authorized representative who has indicated his/her understanding and acceptance.   Dental advisory given  Plan Discussed with: CRNA  Anesthesia Plan Comments:         Anesthesia Quick Evaluation

## 2012-08-22 NOTE — H&P (View-Only) (Signed)
Consult Note: Gyn-Onc  [name] [name] 61 y.o. female  CC:  Chief Complaint  Patient presents with  . Adenocarcinoma    New patient    HPI: Patient is seen today in consultation at the request of Dr. Sherrill for new diagnosis of primary peritoneal carcinoma.  Patient is a 61-year-old gravida 2 para 2 who went through menopause at the age about 61 years of age. She never took any hormone replacement therapy. She states that for the past 6 months she's experienced some pelvic soreness in pressure. She has noted some increasing urinary frequency. She has seen her primary care physician Dr. name gallant occasionally she's had urinary tract infections and occasionally not. She states that when she lays down she voids about every 2 hours and this has been going on for about one years time. She otherwise denies any change in her bowel or bladder habits. She states she feels pressure deep in the pelvis and that sometimes the  pressure is better with voiding sometimes not. She denies any nausea or vomiting. She denies any shortness of breath. She states her weight is stable. She states she has early satiety but then states that when she eats too much she feels full. She states that she eats more than she thinks she should.  These symptoms she had a PET scan on October 9. It revealed significant soft tissue taking seen throughout the greater omentum suspicious for intraperitoneal carcinoma. There is minimal ascites in the pelvis. She diverticulosis of the sigmoid colon. There is no evidence of soft tissue masses along the small or large bowel. The adnexa were unremarkable in appearance. Should a small uterine fibroid. There are no other soft tissue masses identified within the abdomen or pelvis. There is no evidence of lymphadenopathy. She had a PET scan performed on October 31 it revealed the areas of omental and peritoneal nodularity were hypermetabolic. There is a dominant pelvic omental mass measuring 10.4  x 3.3 cm. There is a small omental implants in the left side of the abdomen measuring 1.1 cm. There were multiple pelvic foci  That wereperitoneal based  And hypermetabolic. These included the cul-de-sac. She has small volume of ascites. There is no supradiaphragmatic disease. She underwent a CT-guided biopsy on October 21. It revealed metastatic carcinoma. By immunohistochemistry, malignant cells were positive for WT 1, cytokeratin 7, estrogen receptor. There were negative for TTF-1, progesterone receptor, cytokeratin 20, CEA, CD X2, and S100. Overall, the histologic features were highly suggestive of a primary gynecologic origin. Her CA 125 on October 10 was elevated at 69.  Interval History:   Review of Systems: Review of systems is as above. She has a chest pain shortness of breath nausea vomiting fevers or chills. She has some right leg numbness as a result of back surgery for 5 years ago. She has any headaches or visual changes. She is quite anxious as a result of all of this.  Current Meds:  Outpatient Encounter Prescriptions as of 08/17/2012  Medication Sig Dispense Refill  . ALPRAZolam (XANAX) 0.5 MG tablet Take 1 tablet (0.5 mg total) by mouth every 8 (eight) hours as needed for sleep or anxiety.  30 tablet  0  . calcium-vitamin D (OSCAL WITH D) 500-200 MG-UNIT per tablet Take 1 tablet by mouth daily.        . fish oil-omega-3 fatty acids 1000 MG capsule Take 1 g by mouth daily.        . Flaxseed, Linseed, (FLAX SEED OIL) 1000   MG CAPS Take 1 capsule by mouth daily.      . Multiple Vitamin (MULTIVITAMIN WITH MINERALS) TABS Take 1 tablet by mouth daily.      . pravastatin (PRAVACHOL) 40 MG tablet Take 40 mg by mouth daily.        Allergy:  Allergies  Allergen Reactions  . Atorvastatin     REACTION: causes leg pain  . Decongest-Aid (Pseudoephedrine)     Makes my heart flutter  . Macrodantin     Social Hx:  2 adult children ages 41 and 45. History   Social History  . Marital  Status: Married    Spouse Name: N/A    Number of Children: N/A  . Years of Education: N/A   Occupational History  . Not on file.   Social History Main Topics  . Smoking status: Never Smoker   . Smokeless tobacco: Not on file  . Alcohol Use: No  . Drug Use: No  . Sexually Active: Yes    Birth Control/ Protection: Post-menopausal, Surgical   Other Topics Concern  . Not on file   Social History Narrative  . No narrative on file    Past Surgical Hx:  Past Surgical History  Procedure Date  . Hand surgery 1996  . Tubal ligation 1980  . Hysteroscopy 2004    D&C  . Dilation and curettage of uterus 2004    WITH HYSTEROSCOPY  . Neck surgery 2009    SPURS  . Cyst on neck 2011    Past Medical Hx: Dear -2012, mammogram negative December 2012, colonoscopy 7 years ago unremarkable. Past Medical History  Diagnosis Date  . Rheumatoid arthritis   . Endometrial polyp   . Cystocele with uterine descensus     AND RECTOCELE  . Elevated cholesterol     Family Hx: Maternal grandfather with throat cancer. Father with bladder cancer. Family History  Problem Relation Age of Onset  . Heart disease Mother   . Osteoporosis Sister     Vitals:  Blood pressure 118/78, pulse 78, temperature 98 F (36.7 C), resp. rate 16, height 5' 5.55" (1.665 m), weight 146 lb 9.6 oz (66.497 kg).  Physical Exam:  Nourished well developed female in no acute distress.  Neck: Supple, no lymphadenopathy, no thyromegaly.  Lungs: Clear to auscultation bilaterally.  Her breasts are: Regular rate and rhythm.  Breasts: Symmetrical, no masses, no nodularity. No skin changes. No nipple discharge. No axillary adenopathy.  Abdomen: Soft. Minimally tender in the deep lower quadrants. Positive fluid wave. Fullness in the mid lower abdomen no definitive mass.  Groins: No lymphadenopathy.  Extremities: No edema.  Pelvic: Normal external female genitalia. Prominent rectocele. Vagina is markedly atrophic. She  is evidence of cystocele as well as bilateral lateral wall defects on speculum examination. The cervix is unremarkable. Rectal examination reveals a large rectocele. There is no definitive a large abdominal pelvic mass. On rectovaginal examination there is some nodularity in the posterior cul-de-sac that is nontender. Nodularity. Measured 2 x 3 cm. Assessment/Plan: 61-year-old with primary peritoneal carcinoma. Based on imaging probably a stage IIIc. Looking at her imaging I do believe that an attempt at a debulking would be reasonable. I discussed with the patient that we would proceed with exploratory laparotomy. She would undergo a Total abdominal hysterectomy, BSO, omentectomy and again an attempt at optimal debulking. She understands that this may or may not include any portion of bowel surgery. She also understands that this point we will not be addressing her   pelvic organ relaxation. We will proceed with the cancer related surgery first she will need to follow with chemotherapy and once she has recovered from all of this if at that point she wishes to referral to urogynecology we would be happy to coordinate that for her.  Recent surgery including bleeding infection injury to surrounding organs and thromboembolic disease were discussed the patient. She understands the risks could occur and that this is not a comprehensive list. She is tentatively scheduled for surgery on November 19 with Dr. Clark Pearson. Her questions as well as those of her sister were elicited in answer to her satisfaction.  She scheduled for preop visit tomorrow. She is concerned she may have been a urinary tract infection would like for us to check a urinalysis today. Her last CA 125 was 69 on October 10. We will therefore check another CA 125 at the time of her preop visit. She has our card and knows to contact us if she has any questions prior to her surgical date.  Lynda Wanninger A., MD 08/17/2012, 9:36 AM   

## 2012-08-23 DIAGNOSIS — C569 Malignant neoplasm of unspecified ovary: Secondary | ICD-10-CM | POA: Diagnosis present

## 2012-08-23 LAB — CBC
Hemoglobin: 11.2 g/dL — ABNORMAL LOW (ref 12.0–15.0)
MCH: 31.8 pg (ref 26.0–34.0)
MCV: 96.6 fL (ref 78.0–100.0)
RBC: 3.52 MIL/uL — ABNORMAL LOW (ref 3.87–5.11)

## 2012-08-23 LAB — BASIC METABOLIC PANEL
CO2: 24 mEq/L (ref 19–32)
Chloride: 104 mEq/L (ref 96–112)
Glucose, Bld: 171 mg/dL — ABNORMAL HIGH (ref 70–99)
Potassium: 4.2 mEq/L (ref 3.5–5.1)
Sodium: 137 mEq/L (ref 135–145)

## 2012-08-23 MED ORDER — ALPRAZOLAM 0.5 MG PO TABS
0.5000 mg | ORAL_TABLET | Freq: Three times a day (TID) | ORAL | Status: DC | PRN
  Administered 2012-08-25: 0.5 mg via ORAL
  Filled 2012-08-23: qty 1

## 2012-08-23 MED ORDER — ACETAMINOPHEN 325 MG PO TABS: 325.0000 mg | ORAL_TABLET | Freq: Four times a day (QID) | ORAL | Status: DC | PRN

## 2012-08-23 MED ORDER — ENOXAPARIN SODIUM 40 MG/0.4ML ~~LOC~~ SOLN
40.0000 mg | SUBCUTANEOUS | Status: DC
  Administered 2012-08-23 – 2012-08-27 (×5): 40 mg via SUBCUTANEOUS
  Filled 2012-08-23 (×5): qty 0.4

## 2012-08-23 NOTE — Progress Notes (Signed)
Utilization review completed.  

## 2012-08-23 NOTE — Progress Notes (Signed)
1 Day Post-Op Procedure(s) (LRB): HYSTERECTOMY ABDOMINAL (N/A) SALPINGO OOPHORECTOMY (Bilateral) OMENTECTOMY (N/A) DEBULKING (N/A) COLON RESECTION SIGMOID ()  Subjective: Patient reports no sleep last pm.  Multiple complaints voiced about nursing care last pm.  Reporting adequate pain control with PCA use.  Denies nausea, vomiting, flatus, chest pain, and dyspnea.  Objective: Vital signs in last 24 hours: Temp:  [98.2 F (36.8 C)-99.2 F (37.3 C)] 98.2 F (36.8 C) (11/20 0620) Pulse Rate:  [65-103] 77  (11/20 0620) Resp:  [8-20] 16  (11/20 0749) BP: (118-141)/(66-88) 119/74 mmHg (11/20 0620) SpO2:  [94 %-100 %] 100 % (11/20 0749) Weight:  [148 lb (67.132 kg)] 148 lb (67.132 kg) (11/19 1730)    Intake/Output from previous day: 11/19 0701 - 11/20 0700 In: 4825 [I.V.:4825] Out: 3255 [Urine:480; Drains:2000; Blood:700]  Physical Examination: General: alert, cooperative and anxious and frustrated Resp: mildly diminished in the bases Cardio: regular rate and rhythm, S1, S2 normal, no murmur, click, rub or gallop GI: soft, non-tender; bowel sounds normal; no masses,  no organomegaly and incision: abdomen with staples clean, dry, and intact Extremities: extremities normal, atraumatic, no cyanosis or edema JP to right abdomen with minimal serosanguinous drainage  Labs: WBC/Hgb/Hct/Plts:  12.1/11.2/34.0/262 (11/20 0340) BUN/Cr/glu/ALT/AST/amyl/lip:  8/0.63/--/--/--/--/-- (11/20 0340)  Assessment: 61 y.o. s/p Procedure(s): HYSTERECTOMY ABDOMINAL SALPINGO OOPHORECTOMY OMENTECTOMY DEBULKING COLON RESECTION SIGMOID: stable Pain:  Pain is well-controlled on PCA.  GI:  Tolerating po: No: NPO     Prophylaxis: intermittent pneumatic compression boots.  Plan: Encourage IS use, deep breathing, and coughing Encourage ambulation Begin clear liquids Lovenox 40 mg SQ daily Continue post operative care   LOS: 1 day    [name] [name] DEAL 08/23/2012, 8:57 AM

## 2012-08-24 NOTE — Progress Notes (Signed)
2 Days Post-Op Procedure(s) (LRB): HYSTERECTOMY ABDOMINAL (N/A) SALPINGO OOPHORECTOMY (Bilateral) OMENTECTOMY (N/A) DEBULKING (N/A) COLON RESECTION SIGMOID ()  Subjective: Patient reports "sleeping great last night."  Tolerating clear liquids.  Pain controlled with PO Percocet.  Denies passing flatus or having a bowel movement at this time.  Denies nausea, vomiting, chest pain, or dyspnea.    Objective: Vital signs in last 24 hours: Temp:  [98.4 F (36.9 C)-99.2 F (37.3 C)] 98.9 F (37.2 C) (11/21 0622) Pulse Rate:  [70-93] 70  (11/21 0622) Resp:  [15-20] 18  (11/21 0622) BP: (105-124)/(69-75) 110/70 mmHg (11/21 0622) SpO2:  [94 %-100 %] 94 % (11/21 0622) Last BM Date: 08/22/12  Intake/Output from previous day: 11/20 0701 - 11/21 0700 In: 4282.1 [P.O.:740; I.V.:3542.1] Out: 2880 [Urine:2870; Drains:10]  Physical Examination: General: alert, cooperative and no distress Resp: clear to auscultation bilaterally Cardio: regular rate and rhythm, S1, S2 normal, no murmur, click, rub or gallop GI: soft, non-tender; bowel sounds normal; no masses,  no organomegaly and incision: midline incision with staples clean, dry, and intact. Extremities: extremities normal, atraumatic, no cyanosis or edema  Assessment: 61 y.o. s/p Procedure(s): HYSTERECTOMY ABDOMINAL SALPINGO OOPHORECTOMY OMENTECTOMY DEBULKING COLON RESECTION SIGMOID: stable Pain:  Pain is well-controlled on oral medications.  GI:  Tolerating po: Yes.     Prophylaxis: pharmacologic prophylaxis (with any of the following: enoxaparin (Lovenox) 40mg  SQ 2 hours prior to surgery then every day) and intermittent pneumatic compression boots.  Plan: Advance diet to full liquids Encourage ambulation Saline lock IV Encourage IS use, deep breathing, and coughing Continue post-operative plan of care   LOS: 2 days    CROSS, MELISSA DEAL 08/24/2012, 8:16 AM

## 2012-08-25 ENCOUNTER — Encounter (HOSPITAL_COMMUNITY): Payer: Self-pay

## 2012-08-25 ENCOUNTER — Encounter (HOSPITAL_COMMUNITY): Payer: Self-pay | Admitting: Gynecology

## 2012-08-25 MED ORDER — ENOXAPARIN (LOVENOX) PATIENT EDUCATION KIT
PACK | Freq: Once | Status: AC
  Administered 2012-08-25: 11:00:00
  Filled 2012-08-25: qty 1

## 2012-08-25 NOTE — Progress Notes (Signed)
3 Days Post-Op Procedure(s) (LRB): HYSTERECTOMY ABDOMINAL (N/A) SALPINGO OOPHORECTOMY (Bilateral) OMENTECTOMY (N/A) DEBULKING (N/A) COLON RESECTION SIGMOID ()  Subjective: Patient reports tolerating full liquids.  Adequate pain relief reported.  Ambulating without assistance.  Denies passing flatus or having a bowel movement but stating "I can hear it rolling."  Denies nausea, vomiting, chest pain, or dyspnea.   Objective: Vital signs in last 24 hours: Temp:  [98.5 F (36.9 C)-98.9 F (37.2 C)] 98.9 F (37.2 C) (11/22 0603) Pulse Rate:  [83-91] 91  (11/22 0603) Resp:  [16-20] 18  (11/22 0603) BP: (105-144)/(65-84) 144/84 mmHg (11/22 0603) SpO2:  [95 %-97 %] 96 % (11/22 0603) Last BM Date: 08/22/12  Intake/Output from previous day: 11/21 0701 - 11/22 0700 In: 630 [P.O.:480; I.V.:150] Out: 2825 [Urine:2750; Drains:75]  Physical Examination: General: alert, cooperative and no distress Resp: clear to auscultation bilaterally Cardio: regular rate and rhythm, S1, S2 normal, no murmur, click, rub or gallop GI: soft, non-tender; bowel sounds normal; no masses,  no organomegaly and incision: midline incision with staples clean, dry, and intact Extremities: extremities normal, atraumatic, no cyanosis or edema JP Drain:  Minimal amount of serosanguinous drainage noted, 75 cc out in 24 hour period  Assessment: 61 y.o. s/p Procedure(s): HYSTERECTOMY ABDOMINAL SALPINGO OOPHORECTOMY OMENTECTOMY DEBULKING COLON RESECTION SIGMOID: stable Pain:  Pain is well-controlled on oral medications.  GI:  Tolerating po: Yes.  Active bowel sounds in all quadrants.     Prophylaxis: intermittent pneumatic compression boots and Lovenox.  Plan: Advance diet Encourage ambulation Lovenox teaching kit for discharge Encourage IS use, deep breathing, and coughing Continue post-operative plan of care Possible discharge in am   LOS: 3 days    Satara Virella DEAL 08/25/2012, 10:08 AM

## 2012-08-26 NOTE — Progress Notes (Signed)
Patient ID: [name] [name], female   DOB: 1951-08-05, 61 y.o.   MRN: [id number] 4 Days Post-Op Procedure(s) (LRB): HYSTERECTOMY ABDOMINAL (N/A) SALPINGO OOPHORECTOMY (Bilateral) OMENTECTOMY (N/A) DEBULKING (N/A) COLON RESECTION SIGMOID ()  Subjective: Patient reports tolerating diet. Denies passing flatus or having a bowel movement.   Objective: Vital signs in last 24 hours: Temp:  [98.7 F (37.1 C)-99.1 F (37.3 C)] 98.7 F (37.1 C) (11/23 0508) Pulse Rate:  [72-98] 72  (11/23 0508) Resp:  [18-20] 18  (11/23 0508) BP: (114-150)/(78-95) 146/90 mmHg (11/23 0508) SpO2:  [96 %-98 %] 96 % (11/23 0508) Last BM Date: 08/22/12  Intake/Output from previous day: 11/22 0701 - 11/23 0700 In: 720 [P.O.:720] Out: 2590 [Urine:2495; Drains:95]  Physical Examination: General: alert, cooperative and no distress Resp: clear to auscultation bilaterally Cardio: regular rate and rhythm, S1, S2 normal, no murmur, click, rub or gallop GI: soft, non-tender; bowel sounds normal; no masses,  no organomegaly and incision: midline incision with staples clean, dry, and intact Extremities: extremities normal, atraumatic, no cyanosis or edema JP Drain:  serosanguinous drainage noted, 95 cc out in 24 hour period  Assessment: 61 y.o. s/p Procedure(s): HYSTERECTOMY ABDOMINAL SALPINGO OOPHORECTOMY OMENTECTOMY DEBULKING COLON RESECTION SIGMOID: stable Pain:  Pain is well-controlled on oral medications.  GI:  Tolerating po: Yes.  Active bowel sounds in all quadrants.     Prophylaxis: intermittent pneumatic compression boots and Lovenox.  Plan: Encourage IS use, deep breathing, and coughing Continue post-operative plan of care    LOS: 4 days    JACKSON-MOORE,Versie Fleener A 08/26/2012, 9:20 AM

## 2012-08-27 MED ORDER — ENOXAPARIN SODIUM 40 MG/0.4ML ~~LOC~~ SOLN: 40.0000 mg | SUBCUTANEOUS | Status: DC

## 2012-08-27 MED ORDER — OXYCODONE-ACETAMINOPHEN 5-325 MG PO TABS: 1.0000 | ORAL_TABLET | Freq: Four times a day (QID) | ORAL | Status: DC | PRN

## 2012-08-27 NOTE — Discharge Summary (Signed)
Physician Discharge Summary  Patient ID: [name] [name] MRN: [id number] DOB/AGE: [birth year] 61 y.o.  Admit date: 08/22/2012 Discharge date: 08/27/2012  Admission Diagnoses: Ovarian cancer  Discharge Diagnoses:  Principal Problem:  *Ovarian cancer   Discharged Condition: good  Hospital Course: On 08/22/2012, the patient underwent the following: Procedure(s): HYSTERECTOMY ABDOMINAL SALPINGO OOPHORECTOMY OMENTECTOMY DEBULKING COLON RESECTION SIGMOID.   The postoperative course was uneventful.  She was discharged to home on postoperative day 5 tolerating a regular diet.  Consults: None  Significant Diagnostic Studies: none  Treatments: surgery: see above  Discharge Exam: Blood pressure 126/85, pulse 96, temperature 98.9 F (37.2 C), temperature source Oral, resp. rate 18, height 5\' 4"  (1.626 m), weight 67.132 kg (148 lb), SpO2 97.00%. General appearance: alert GI: soft, non-tender; bowel sounds normal; no masses,  no organomegaly Extremities: extremities normal, atraumatic, no cyanosis or edema Incision/Wound: C/D/I  Disposition: Discharged to home  Discharge Orders    Future Appointments: Provider: Department: Dept Phone: Center:   09/05/2012 3:30 PM Jeannette Corpus, MD [facility] GYNECOLOGICAL ONCOLOGY [phone] None   09/06/2012 9:00 AM [name], MD [facility] MEDICAL ONCOLOGY [phone] None   09/07/2012 9:00 AM Trellis Paganini, MD [facility] [phone] [facility]   09/20/2012 10:15 AM [facility] [phone]   01/03/2013 9:30 AM Michele Mcalpine, MD [facility] [phone] None     Future Orders Please Complete By Expires   Diet - low sodium heart healthy      Increase activity slowly      May walk up steps      May shower / Bathe      Comments:   No tub baths for 6 weeks   Driving Restrictions      Comments:   No driving for 1- 2 weeks     Lifting restrictions      Comments:   No lifting > 5 lbs for 6 weeks   Sexual Activity Restrictions      Comments:   No intercourse for 6 - 8 weeks   Discharge wound care:      Comments:   Keep clean and dry   Call MD for:  temperature >100.4      Call MD for:  persistant nausea and vomiting      Call MD for:  severe uncontrolled pain      Call MD for:  redness, tenderness, or signs of infection (pain, swelling, redness, odor or green/yellow discharge around incision site)      Call MD for:  persistant dizziness or light-headedness      Call MD for:  extreme fatigue          Medication List     As of 08/27/2012 11:55 AM    TAKE these medications         ALPRAZolam 0.5 MG tablet   Commonly known as: XANAX   Take 1 tablet (0.5 mg total) by mouth every 8 (eight) hours as needed for sleep or anxiety.      calcium-vitamin D 500-200 MG-UNIT per tablet   Commonly known as: OSCAL WITH D   Take 1 tablet by mouth daily.      enoxaparin 40 MG/0.4ML injection   Commonly known as: LOVENOX   Inject 0.4 mLs (40 mg total) into the skin daily.      fish oil-omega-3 fatty acids 1000 MG capsule   Take 1 g by mouth daily.  Flax Seed Oil 1000 MG Caps   Take 1 capsule by mouth daily.      multivitamin with minerals Tabs   Take 1 tablet by mouth daily.      oxyCODONE-acetaminophen 5-325 MG per tablet   Commonly known as: PERCOCET/ROXICET   Take 1-2 tablets by mouth every 6 (six) hours as needed (moderate to severe pain (when tolerating fluids)).      pravastatin 40 MG tablet   Commonly known as: PRAVACHOL   Take 40 mg by mouth daily.           Follow-up Information    Please follow up.   Contact information:   Call Deatra Robinson [phone]         Signed: Antionette Char A 08/27/2012, 11:55 AM

## 2012-08-27 NOTE — Progress Notes (Signed)
Assessment unchanged. Pt verbalized understanding of dc instructions as well as MyChart. Script x1 given as provided by MD. Pt has Lovenox kit to take home. MD discontinued JP drain prior to discharge home. Pt discharged via wheelchair to front entrance to meet awaiting vehicle to carry home. Accompanied by husband and NT.

## 2012-08-28 ENCOUNTER — Telehealth: Payer: Self-pay | Admitting: Gynecologic Oncology

## 2012-08-28 NOTE — Care Management Note (Signed)
    Page 1 of 1   08/28/2012     10:21:01 AM   CARE MANAGEMENT NOTE 08/28/2012  Patient:  [name] [name], [name] [name]   Account Number:  [id number]  Date Initiated:  08/23/2012  Documentation initiated by:  Lorenda Ishihara  Subjective/Objective Assessment:   60 yo female admitted s/p Radical debulking of ovarian cancer including: TAH, BSO, ureteral lysis, omentectomy, stripping of pelvic peritoneum, and rectosigmoid resection with low rectal anastomosis. PTA lived at home with spouse.     Action/Plan:   Home when stable   Anticipated DC Date:  08/28/2012   Anticipated DC Plan:  HOME/SELF CARE      DC Planning Services  CM consult      Choice offered to / List presented to:             Status of service:  Completed, signed off Medicare Important Message given?   (If response is "NO", the following Medicare IM given date fields will be blank) Date Medicare IM given:   Date Additional Medicare IM given:    Discharge Disposition:  HOME/SELF CARE  Per UR Regulation:  Reviewed for med. necessity/level of care/duration of stay  If discussed at Long Length of Stay Meetings, dates discussed:    Comments:

## 2012-08-28 NOTE — Telephone Encounter (Signed)
Post op telephone call to check patient status.  Patient describes expected post operative status.  Adequate PO intake reported.  Bladder functioning without difficulty.  Used Miralax to assist with having a bowel movement.  Reporting first bowel movement as dark but reassured that was a normal findings since she had a bowel resection.  Taking lovenox injections as directed.  Pain minimal.  Reportable signs and symptoms reviewed.  Follow up appt arranged.

## 2012-08-30 ENCOUNTER — Telehealth: Payer: Self-pay | Admitting: *Deleted

## 2012-08-30 NOTE — Telephone Encounter (Signed)
Asking if 09/06/12 is too soon to see Dr. Truett Perna?

## 2012-09-05 ENCOUNTER — Ambulatory Visit: Payer: Medicare Other | Attending: Gynecology | Admitting: Gynecology

## 2012-09-05 ENCOUNTER — Encounter: Payer: Self-pay | Admitting: Gynecology

## 2012-09-05 VITALS — BP 130/74 | HR 84 | Temp 98.2°F | Resp 18 | Wt 142.6 lb

## 2012-09-05 DIAGNOSIS — E7800 Pure hypercholesterolemia, unspecified: Secondary | ICD-10-CM | POA: Insufficient documentation

## 2012-09-05 DIAGNOSIS — Z90710 Acquired absence of both cervix and uterus: Secondary | ICD-10-CM | POA: Insufficient documentation

## 2012-09-05 DIAGNOSIS — M069 Rheumatoid arthritis, unspecified: Secondary | ICD-10-CM | POA: Insufficient documentation

## 2012-09-05 DIAGNOSIS — C569 Malignant neoplasm of unspecified ovary: Secondary | ICD-10-CM | POA: Insufficient documentation

## 2012-09-05 DIAGNOSIS — Z8249 Family history of ischemic heart disease and other diseases of the circulatory system: Secondary | ICD-10-CM | POA: Insufficient documentation

## 2012-09-05 NOTE — Progress Notes (Signed)
28 staples removed from midline incision without difficulty.  No drainage or erythema noted.  1/2 inch steri strips applied.

## 2012-09-05 NOTE — Progress Notes (Signed)
Consult Note: Gyn-Onc   [name] [name] 61 y.o. female  Chief Complaint  Patient presents with  . Ovarian Cancer    Follow up post-op    Interval History:  The patient returns today for initial postoperative followup and discussion of pathology. Overall she's done well although she still has some abdominal soreness for which she is taking oxycodone when necessary. Her bowel function is returning to normal her appetite is reasonably good. She has no GU symptoms. She denies any vaginal bleeding. Functional status is increasing steadily.  HPI: Patient is a 61 year old gravida 2 para 2 who went through menopause at the age about 61 years of age. She never took any hormone replacement therapy. She states that for the past 6 months she's experienced some pelvic soreness in pressure. She has noted some increasing urinary frequency. With these symptoms she had a PET scan on October 9. It revealed significant soft tissue thickening seen throughout the greater omentum suspicious for intraperitoneal carcinoma. There is minimal ascites in the pelvis.  it revealed the areas of omental and peritoneal nodularity were hypermetabolic. There is a dominant pelvic omental mass measuring 10.4 x 3.3 cm. There is a small omental implants in the left side of the abdomen measuring 1.1 cm. There were multiple pelvic foci That wereperitoneal based And hypermetabolic. These included the cul-de-sac. She has small volume of ascites.  She underwent a CT-guided biopsy on July 24, 2012. It revealed metastatic carcinoma. By immunohistochemistry, malignant cells were positive for WT 1, cytokeratin 7, estrogen receptor. There were negative for TTF-1, progesterone receptor, cytokeratin 20, CEA, CD X2, and S100. Overall, the histologic features were highly suggestive of a primary gynecologic origin. Her CA 125 on October 10 was elevated at 69.  She underwent exploratory laparotomy on 08/22/2012 findings of stage IIIC ovarian cancer.  Patient was optimally debulked. Debulking, however, required a rectosigmoid resection as well as total omentectomy total nominal hysterectomy bilateral salpingo-oophorectomy. She was optimally debulked with only 5 mm nodules on the right diaphragm as residual disease.  SURGICAL FINDINGS: At exploratory laparotomy the infracolic omentum was nearly replaced with tumor. This measured approximately 12 x 16 cm. There were 5 mm nodules on the right diaphragm. The small bowel serosa and mesentery were normal. In the pelvis it appeared that the left ovary was a primary source. It was cystic and densely adherent to the sigmoid mesentery. The right ovary was approximately 4 cm in diameter and had multiple excrescences on it. There were extensive tumor implants on the bladder flap and involving the sigmoid mesentery and serosa, and posterior cul-de-sac. At the conclusion of the surgical procedure the only residual disease were 5 mm nodules on the right diaphragm.   Review of Systems:10 point review of systems is negative as noted above.   Vitals: Blood pressure 130/74, pulse 84, temperature 98.2 F (36.8 C), resp. rate 18, weight 142 lb 9.6 oz (64.683 kg).  Physical Exam: General : The patient is a healthy woman in no acute distress.  HEENT: normocephalic, extraoccular movements normal; neck is supple without thyromegally  Lynphnodes: Supraclavicular and inguinal nodes not enlarged  Abdomen: Soft, non-tender, no ascites, no organomegally, no masses, no hernias , midline incision is healing well Pelvic: Deferred  Lower extremities: No edema or varicosities. 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 Patient's postoperative recovery is satisfactory. She is scheduled to see Dr. Mancel Bale tomorrow to discuss chemotherapy. I'll have the patient  return to see me in a proximally 6 weeks for surgical followup.  Allergies  Allergen  Reactions  . Atorvastatin     REACTION: causes leg pain  . Decongest-Aid (Pseudoephedrine)     Makes my heart flutter  . Macrodantin     Past Medical History  Diagnosis Date  . Rheumatoid arthritis   . Endometrial polyp   . Cystocele with uterine descensus     AND RECTOCELE  . Elevated cholesterol     Past Surgical History  Procedure Date  . Hand surgery 1996  . Tubal ligation 1980  . Hysteroscopy 2004    D&C  . Dilation and curettage of uterus 2004    WITH HYSTEROSCOPY  . Neck surgery 2009    SPURS  . Cyst on neck 2011  . Abdominal hysterectomy 08/22/2012    Procedure: HYSTERECTOMY ABDOMINAL;  Surgeon: Jeannette Corpus, MD;  Location: [facility];  Service: Gynecology;  Laterality: N/A;  . Salpingoophorectomy 08/22/2012    Procedure: SALPINGO OOPHORECTOMY;  Surgeon: Jeannette Corpus, MD;  Location: [facility];  Service: Gynecology;  Laterality: Bilateral;  . Omentectomy 08/22/2012    Procedure: OMENTECTOMY;  Surgeon: Jeannette Corpus, MD;  Location: [facility];  Service: Gynecology;  Laterality: N/A;  . Debulking 08/22/2012    Procedure: DEBULKING;  Surgeon: Jeannette Corpus, MD;  Location: [facility];  Service: Gynecology;  Laterality: N/A;  Radical tumor debulking, Bilateral Ureterolysis  . Colostomy revision 08/22/2012    Procedure: COLON RESECTION SIGMOID;  Surgeon: Jeannette Corpus, MD;  Location: [facility];  Service: Gynecology;;  Rectal Sigmoid resection and low rectal anastomosis    Current Outpatient Prescriptions  Medication Sig Dispense Refill  . ALPRAZolam (XANAX) 0.5 MG tablet Take 1 tablet (0.5 mg total) by mouth every 8 (eight) hours as needed for sleep or anxiety.  30 tablet  0  . calcium-vitamin D (OSCAL WITH D) 500-200 MG-UNIT per tablet Take 1 tablet by mouth daily.        [name] enoxaparin (LOVENOX) 40 MG/0.4ML injection Inject 0.4 mLs (40 mg total) into the skin daily.  16 Syringe  0  . fish oil-omega-3 fatty acids 1000 MG capsule Take 1 g  by mouth daily.        . Flaxseed, Linseed, (FLAX SEED OIL) 1000 MG CAPS Take 1 capsule by mouth daily.      . Multiple Vitamin (MULTIVITAMIN WITH MINERALS) TABS Take 1 tablet by mouth daily.      [name] oxyCODONE-acetaminophen (PERCOCET/ROXICET) 5-325 MG per tablet Take 1-2 tablets by mouth every 6 (six) hours as needed (moderate to severe pain (when tolerating fluids)).  30 tablet  0  . pravastatin (PRAVACHOL) 40 MG tablet Take 40 mg by mouth daily.        History   Social History  . Marital Status: Married    Spouse Name: N/A    Number of Children: N/A  . Years of Education: N/A   Occupational History  . Not on file.   Social History Main Topics  . Smoking status: Never Smoker   . Smokeless tobacco: Not on file  . Alcohol Use: No  . Drug Use: No  . Sexually Active: Yes    Birth Control/ Protection: Post-menopausal, Surgical   Other Topics Concern  . Not on file   Social History Narrative  . No narrative on file    Family History  Problem Relation Age of Onset  . Heart disease Mother   . Osteoporosis Sister  Jeannette Corpus, MD 09/05/2012, 3:50 PM

## 2012-09-05 NOTE — Patient Instructions (Signed)
Please keep your appointment with Dr. Truett Perna and medical oncology tomorrow. We'll schedule her to return for a surgical followup after Christmas

## 2012-09-06 ENCOUNTER — Ambulatory Visit (HOSPITAL_BASED_OUTPATIENT_CLINIC_OR_DEPARTMENT_OTHER): Payer: Medicare Other | Admitting: Oncology

## 2012-09-06 ENCOUNTER — Telehealth: Payer: Self-pay | Admitting: Oncology

## 2012-09-06 ENCOUNTER — Telehealth: Payer: Self-pay | Admitting: *Deleted

## 2012-09-06 VITALS — BP 110/70 | HR 92 | Temp 97.4°F | Resp 18 | Ht 64.0 in | Wt 141.7 lb

## 2012-09-06 DIAGNOSIS — F411 Generalized anxiety disorder: Secondary | ICD-10-CM

## 2012-09-06 DIAGNOSIS — C569 Malignant neoplasm of unspecified ovary: Secondary | ICD-10-CM

## 2012-09-06 DIAGNOSIS — G8929 Other chronic pain: Secondary | ICD-10-CM

## 2012-09-06 DIAGNOSIS — M542 Cervicalgia: Secondary | ICD-10-CM

## 2012-09-06 NOTE — Telephone Encounter (Signed)
gv and printed appt schedule for pt for Dec and Jan 2014....lvm for Tina in IR...advised pt that Inetta Fermo will call her with d.t. Appt..the patient aware

## 2012-09-06 NOTE — Telephone Encounter (Signed)
Per staff phone call and POF I have scheduled appts. JMW  

## 2012-09-06 NOTE — Progress Notes (Signed)
   [facility]    OFFICE PROGRESS NOTE   INTERVAL HISTORY:   She returns as scheduled. She underwent an exploratory laparotomy on 08/22/2012. The infracolic omentum was nearly replaced by tumor, 5 mm nodules were noted on right diaphragm, and there appeared to be a primary tumor at the left ovary. Extensive tumor implants were noted on the bladder and involving the sigmoid mesentery and serosa and posterior cul-de-sac. She underwent an optimal debulking procedure with only residual 5 mm nodules at the right diaphragm remaining.  The pathology confirmed a high-grade steroids carcinoma involving the omentum, ovaries/fallopian tubes, uterine serosa and serosa of the colon.  She reports abdominal soreness. Her bowels are functioning though she is having frequent small bowel movements. She is eating a normal diet.    Objective:  Vital signs in last 24 hours:  Blood pressure 110/70, pulse 92, temperature 97.4 F (36.3 C), temperature source Oral, resp. rate 18, height 5\' 4"  (1.626 m), weight 141 lb 11.2 oz (64.275 kg).    Resp: Few end inspiratory coarse rhonchi at the posterior bases bilaterally, no respiratory distress Cardio: Regular rate and rhythm GI: No hepatosplenomegaly, healed midline incision with Steri-Strips in place, no mass  Vascular: No leg edema   Medications: I have reviewed the patient's current medications.  Assessment/Plan:  1. 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 A 5 mm nodules remain on the right diaphragm.  2. Low abdomen/suprapubic pain prior to the exploratory laparotomy-likely secondary to omental/pelvic tumor  3. Chronic neck and back pain  4. Anxiety  Disposition:  She has been diagnosed with advanced stage ovarian cancer. I discussed the diagnosis, prognosis, and adjuvant  treatment options with Ms. Narang today. I recommend adjuvant Taxol/carboplatin chemotherapy.  We reviewed potential toxicities associated with this chemotherapy regimen including the chance for nausea/vomiting, mucositis, diarrhea, alopecia, and hematologic toxicity. We discussed the bone pain, allergic reaction, and neuropathy associated with Taxol. We discussed the allergic reaction seen with carboplatin.  Ms. Weissmann will attend a chemotherapy teaching class. She will be referred for placement of a Port-A-Cath.  We will plan to deliver day 1 Taxol/carboplatin and day 8/day 15 Taxol. A first cycle of chemotherapy has been scheduled for 09/19/2012. She will return for an office visit on 09/26/2012.  Approximately 30 minutes were spent with the patient today. The majority of the time was spent in counseling/coordination of care.   Thornton Papas, MD  09/06/2012  9:26 AM

## 2012-09-07 ENCOUNTER — Telehealth: Payer: Self-pay | Admitting: *Deleted

## 2012-09-07 ENCOUNTER — Encounter: Payer: Self-pay | Admitting: Obstetrics and Gynecology

## 2012-09-07 ENCOUNTER — Ambulatory Visit (INDEPENDENT_AMBULATORY_CARE_PROVIDER_SITE_OTHER): Payer: Medicare Other | Admitting: Obstetrics and Gynecology

## 2012-09-07 VITALS — BP 122/78 | Ht 65.0 in | Wt 142.0 lb

## 2012-09-07 DIAGNOSIS — R350 Frequency of micturition: Secondary | ICD-10-CM

## 2012-09-07 DIAGNOSIS — C569 Malignant neoplasm of unspecified ovary: Secondary | ICD-10-CM

## 2012-09-07 DIAGNOSIS — N816 Rectocele: Secondary | ICD-10-CM

## 2012-09-07 DIAGNOSIS — N644 Mastodynia: Secondary | ICD-10-CM

## 2012-09-07 DIAGNOSIS — IMO0002 Reserved for concepts with insufficient information to code with codable children: Secondary | ICD-10-CM

## 2012-09-07 DIAGNOSIS — N8111 Cystocele, midline: Secondary | ICD-10-CM

## 2012-09-07 NOTE — Addendum Note (Signed)
Addended by: Trellis Paganini on: 09/07/2012 10:49 AM   Modules accepted: Level of Service

## 2012-09-07 NOTE — Progress Notes (Addendum)
Patient came to see me today for her further followup. She had a short term history of abdominal pain. Pelvic ultrasound had been negative. She then underwent CT scan of her abdomen and pelvis that still showed normal ovaries but an omental mass. We referred her to Dr. Kerri Perches and in mid November she underwent exploratory laparotomy. In spite of normal imaging of ovaries on both ultrasound and CT scan she had an ovarian carcinoma with metastasis to omentum and rectosigmoid area. She underwent optimal debulking and is ready to start chemotherapy next week with Dr. Truett Perna. She is now doing well and is guardedly optimistic about her future. We have been watching her with uterine prolapse, cystocele,  rectocele which was never severe enough to require intervention. She does have some urinary frequency without urinary incontinence. Obviously surgical correction of her cystocele and rectocele is not her first priority now. She is scheduled for yearly mammogram. She had a normal bone density here in 2010. She is not on hormone replacement therapy and does well without it. She has had normal Pap smears with her last Pap smear In 2012. She is also having intermittent diffuse left breast tenderness.  ROS: 12 system review done. Pertinent positives above. Other positives include hyperlipidemia, anxiety, Neck and back pain, mitral valve prolapseI and  rheumatoid arthritis.  HEENT: Within normal limits.Kennon Portela present. Neck: No masses. Supraclavicular lymph nodes: Not enlarged. Breasts: Examined in both sitting and lying position. Symmetrical without skin changes or masses. Abdomen: Soft no masses guarding or rebound. No hernias. Pelvic: External within normal limits. BUS within normal limits. Vaginal examination Limited but shows reduction of her cystocele from previous exams with small rectocele. Cervix and uterus absent. Adnexa : bimanuel not done today. Rectovaginal confirmatory. Extremities  within normal limits.  Assessment:  Serous ovarian carcinoma with metastasis. Cystocele. Rectocele. Urinary frequency, mastodynia.  Plan: Chemotherapy to oncologist. Followup bone density after chemotherapy. Mammogram. Pap not done. Urinalysis done.

## 2012-09-07 NOTE — Addendum Note (Signed)
Addended by: Dayna Barker on: 09/07/2012 10:19 AM   Modules accepted: Orders

## 2012-09-07 NOTE — Patient Instructions (Addendum)
Continue treatment through Dr. Argentina Ponder and Dr. Alcide Evener. Mammogram.

## 2012-09-07 NOTE — Telephone Encounter (Signed)
Pt notified that first chemo will be via peripheral IV so she can complete her post-op lovenox prophylaxis.  The port will be scheduled after the first chemo.

## 2012-09-08 LAB — URINALYSIS W MICROSCOPIC + REFLEX CULTURE
Casts: NONE SEEN
Glucose, UA: NEGATIVE mg/dL
Leukocytes, UA: NEGATIVE
Nitrite: NEGATIVE
Protein, ur: NEGATIVE mg/dL
Squamous Epithelial / LPF: NONE SEEN
pH: 5 (ref 5.0–8.0)

## 2012-09-11 ENCOUNTER — Other Ambulatory Visit: Payer: Medicare Other

## 2012-09-11 ENCOUNTER — Other Ambulatory Visit (HOSPITAL_BASED_OUTPATIENT_CLINIC_OR_DEPARTMENT_OTHER): Payer: Medicare Other | Admitting: Lab

## 2012-09-11 ENCOUNTER — Other Ambulatory Visit: Payer: Self-pay | Admitting: Pharmacist

## 2012-09-11 DIAGNOSIS — C569 Malignant neoplasm of unspecified ovary: Secondary | ICD-10-CM

## 2012-09-11 LAB — CBC WITH DIFFERENTIAL/PLATELET
BASO%: 0.5 % (ref 0.0–2.0)
Basophils Absolute: 0 10*3/uL (ref 0.0–0.1)
EOS%: 1 % (ref 0.0–7.0)
Eosinophils Absolute: 0.1 10*3/uL (ref 0.0–0.5)
HCT: 41.4 % (ref 34.8–46.6)
HGB: 13.8 g/dL (ref 11.6–15.9)
LYMPH%: 12.1 % — ABNORMAL LOW (ref 14.0–49.7)
MCH: 32.9 pg (ref 25.1–34.0)
MCHC: 33.4 g/dL (ref 31.5–36.0)
MCV: 98.3 fL (ref 79.5–101.0)
MONO#: 0.7 10*3/uL (ref 0.1–0.9)
MONO%: 10.3 % (ref 0.0–14.0)
NEUT#: 5.4 10*3/uL (ref 1.5–6.5)
NEUT%: 76.1 % (ref 38.4–76.8)
Platelets: 443 10*3/uL — ABNORMAL HIGH (ref 145–400)
RBC: 4.21 10*6/uL (ref 3.70–5.45)
RDW: 13.8 % (ref 11.2–14.5)
WBC: 7.1 10*3/uL (ref 3.9–10.3)
lymph#: 0.9 10*3/uL (ref 0.9–3.3)

## 2012-09-11 LAB — CA 125: CA 125: 42.8 U/mL — ABNORMAL HIGH (ref 0.0–30.2)

## 2012-09-11 MED ORDER — PRAVASTATIN SODIUM 40 MG PO TABS: 40.0000 mg | ORAL_TABLET | Freq: Every day | ORAL | Status: DC

## 2012-09-12 ENCOUNTER — Ambulatory Visit (HOSPITAL_COMMUNITY): Admission: RE | Admit: 2012-09-12 | Payer: Medicare Other | Source: Ambulatory Visit

## 2012-09-13 ENCOUNTER — Telehealth: Payer: Self-pay | Admitting: *Deleted

## 2012-09-13 ENCOUNTER — Other Ambulatory Visit: Payer: Self-pay | Admitting: *Deleted

## 2012-09-13 DIAGNOSIS — C569 Malignant neoplasm of unspecified ovary: Secondary | ICD-10-CM

## 2012-09-13 NOTE — Telephone Encounter (Signed)
Spoke with patient by phone.  She has a lot of nutritional questions.  Referral made to Vernell Leep for diet education.  Patient appreciative of the referral and had no further concerns or needs.

## 2012-09-14 ENCOUNTER — Telehealth: Payer: Self-pay | Admitting: Oncology

## 2012-09-14 NOTE — Telephone Encounter (Signed)
Pt called and I scheduled appt for 12.16.13 for nut

## 2012-09-15 ENCOUNTER — Encounter: Payer: Self-pay | Admitting: *Deleted

## 2012-09-15 ENCOUNTER — Telehealth: Payer: Self-pay | Admitting: *Deleted

## 2012-09-15 NOTE — Progress Notes (Unsigned)
Genetic testing request given to Baltazar Apo who will coordinate appt for genteics with other existing appts. Pt aware

## 2012-09-15 NOTE — Telephone Encounter (Signed)
Left message for pt to return my call so I can schedule a genetic appt.  

## 2012-09-15 NOTE — Telephone Encounter (Signed)
Patient returned my call and I confirmed 11/27/12 genetic appt w/ pt.

## 2012-09-17 ENCOUNTER — Other Ambulatory Visit: Payer: Self-pay | Admitting: Oncology

## 2012-09-18 ENCOUNTER — Ambulatory Visit (HOSPITAL_BASED_OUTPATIENT_CLINIC_OR_DEPARTMENT_OTHER): Payer: Medicare Other | Admitting: Nutrition

## 2012-09-18 ENCOUNTER — Ambulatory Visit (HOSPITAL_BASED_OUTPATIENT_CLINIC_OR_DEPARTMENT_OTHER): Payer: Medicare Other | Admitting: Lab

## 2012-09-18 ENCOUNTER — Telehealth: Payer: Self-pay | Admitting: *Deleted

## 2012-09-18 ENCOUNTER — Other Ambulatory Visit: Payer: Self-pay | Admitting: Oncology

## 2012-09-18 ENCOUNTER — Telehealth: Payer: Self-pay | Admitting: Oncology

## 2012-09-18 DIAGNOSIS — C569 Malignant neoplasm of unspecified ovary: Secondary | ICD-10-CM

## 2012-09-18 LAB — COMPREHENSIVE METABOLIC PANEL (CC13)
ALT: 68 U/L — ABNORMAL HIGH (ref 0–55)
AST: 29 U/L (ref 5–34)
Alkaline Phosphatase: 64 U/L (ref 40–150)
BUN: 17 mg/dL (ref 7.0–26.0)
Calcium: 9.6 mg/dL (ref 8.4–10.4)
Chloride: 106 mEq/L (ref 98–107)
Creatinine: 0.7 mg/dL (ref 0.6–1.1)
Total Bilirubin: 0.41 mg/dL (ref 0.20–1.20)

## 2012-09-18 LAB — CBC WITH DIFFERENTIAL/PLATELET
BASO%: 0.7 % (ref 0.0–2.0)
Basophils Absolute: 0 10*3/uL (ref 0.0–0.1)
EOS%: 1.7 % (ref 0.0–7.0)
HCT: 39.2 % (ref 34.8–46.6)
HGB: 13.2 g/dL (ref 11.6–15.9)
LYMPH%: 16.2 % (ref 14.0–49.7)
MCH: 33.1 pg (ref 25.1–34.0)
MCHC: 33.8 g/dL (ref 31.5–36.0)
MCV: 98.1 fL (ref 79.5–101.0)
MONO%: 11 % (ref 0.0–14.0)
NEUT%: 70.4 % (ref 38.4–76.8)
Platelets: 292 10*3/uL (ref 145–400)
lymph#: 1 10*3/uL (ref 0.9–3.3)

## 2012-09-18 MED ORDER — DEXAMETHASONE 4 MG PO TABS: ORAL_TABLET | ORAL | Status: DC

## 2012-09-18 MED ORDER — LIDOCAINE-PRILOCAINE 2.5-2.5 % EX CREA: TOPICAL_CREAM | CUTANEOUS | Status: DC | PRN

## 2012-09-18 MED ORDER — PROCHLORPERAZINE MALEATE 10 MG PO TABS: 10.0000 mg | ORAL_TABLET | Freq: Four times a day (QID) | ORAL | Status: DC | PRN

## 2012-09-18 NOTE — Telephone Encounter (Signed)
Pt came by today and lab for tomorrow cancelled, it was drawn today, gave pt appt calendar for December 2013 lab/January 2014 and MD

## 2012-09-18 NOTE — Telephone Encounter (Signed)
Left VM that EMLA, Compazine and Decadron have been called in to her pharmacy. Reviewed directions to administer each. Stressed importance of taking steroid tonight and tomorrow morning.

## 2012-09-18 NOTE — Progress Notes (Signed)
This is a 61 year old female patient of Dr. Mancel Bale diagnosed with ovarian cancer status post exploratory lap and debulking.  Past medical history includes anxiety, rheumatoid arthritis, and hypercholesterolemia.  Medications include Xanax, calcium plus vitamin D, omega-3 fatty acids, flaxseed oil, multivitamin, and Pravachol.   Labs include glucose of 170 on November 20.  Height: 65 inches. Weight: 142 pounds December 5. Usual body weight: 152 pounds October 13. BMI: 23.63.  Patient expresses concern about what she can eat during chemotherapy. Patient is worried that she has hypercholesterolemia and there are foods that she has been encouraged to consume during treatment that were discouraged during diet therapy for hypercholesterolemia. Patient is anxious about nutrition side effects she may experience. She has a lot of nutrition related questions. Patient also verbalizes questions about meal preparation during treatment.  Nutrition diagnosis: Food and nutrition related knowledge deficit related to new diagnosis of ovarian cancer and associated treatments as evidenced by no prior need for nutrition related information.  Intervention: I educated patient on the importance of smaller more frequent meals and snacks throughout the day to promote weight maintenance. I have educated her on protein sources and how to incorporate these throughout the day. I've encouraged patient to strive for weight maintenance during treatment. I've answered her questions about high fat foods that she has previously been told to avoid. I provided education about foods safety and easy ways to have high-protein foods available for her that she will not have to prepare. I provided recipes and my contact information. I've also provided her with nutrition supplements and coupons.  Monitoring, evaluation, goals: Patient will tolerate oral diet to minimize weight loss throughout treatment.  Next visit: Tuesday,  December 31, during chemotherapy.

## 2012-09-19 ENCOUNTER — Ambulatory Visit (HOSPITAL_BASED_OUTPATIENT_CLINIC_OR_DEPARTMENT_OTHER): Payer: Medicare Other

## 2012-09-19 ENCOUNTER — Ambulatory Visit: Payer: Medicare Other | Admitting: Nutrition

## 2012-09-19 ENCOUNTER — Other Ambulatory Visit: Payer: Self-pay | Admitting: Radiology

## 2012-09-19 ENCOUNTER — Other Ambulatory Visit: Payer: Medicare Other | Admitting: Lab

## 2012-09-19 ENCOUNTER — Encounter: Payer: Self-pay | Admitting: Obstetrics and Gynecology

## 2012-09-19 VITALS — BP 107/72 | HR 99 | Temp 98.5°F | Resp 18

## 2012-09-19 DIAGNOSIS — Z5111 Encounter for antineoplastic chemotherapy: Secondary | ICD-10-CM

## 2012-09-19 DIAGNOSIS — C569 Malignant neoplasm of unspecified ovary: Secondary | ICD-10-CM

## 2012-09-19 MED ORDER — FAMOTIDINE IN NACL 20-0.9 MG/50ML-% IV SOLN
20.0000 mg | Freq: Once | INTRAVENOUS | Status: AC
  Administered 2012-09-19: 20 mg via INTRAVENOUS

## 2012-09-19 MED ORDER — SODIUM CHLORIDE 0.9 % IV SOLN
650.0000 mg | Freq: Once | INTRAVENOUS | Status: AC
  Administered 2012-09-19: 650 mg via INTRAVENOUS
  Filled 2012-09-19: qty 65

## 2012-09-19 MED ORDER — DIPHENHYDRAMINE HCL 50 MG/ML IJ SOLN
50.0000 mg | Freq: Once | INTRAMUSCULAR | Status: AC
  Administered 2012-09-19: 50 mg via INTRAVENOUS

## 2012-09-19 MED ORDER — PACLITAXEL CHEMO INJECTION 300 MG/50ML
80.0000 mg/m2 | Freq: Once | INTRAVENOUS | Status: AC
  Administered 2012-09-19: 138 mg via INTRAVENOUS
  Filled 2012-09-19: qty 23

## 2012-09-19 MED ORDER — ONDANSETRON 16 MG/50ML IVPB (CHCC)
16.0000 mg | Freq: Once | INTRAVENOUS | Status: AC
  Administered 2012-09-19: 16 mg via INTRAVENOUS

## 2012-09-19 MED ORDER — DEXAMETHASONE SODIUM PHOSPHATE 4 MG/ML IJ SOLN
20.0000 mg | Freq: Once | INTRAMUSCULAR | Status: AC
  Administered 2012-09-19: 20 mg via INTRAVENOUS

## 2012-09-19 MED ORDER — SODIUM CHLORIDE 0.9 % IV SOLN
Freq: Once | INTRAVENOUS | Status: AC
  Administered 2012-09-19: 12:00:00 via INTRAVENOUS

## 2012-09-19 NOTE — Patient Instructions (Addendum)
[facility] Discharge Instructions for Patients Receiving Chemotherapy  Today you received the following chemotherapy agents ; Taxol and Carboplatin.   To help prevent nausea and vomiting after your treatment, we encourage you to take your nausea medication ; Compazine (prochloraperzine) every 6 hrs as needed for any nausea.  May start tonight.   If you develop nausea and vomiting that is not controlled by your nausea medication, call the clinic. If it is after clinic hours your family physician or the after hours number for the clinic or go to the Emergency Department.   BELOW ARE SYMPTOMS THAT SHOULD BE REPORTED IMMEDIATELY:  *FEVER GREATER THAN 100.5 F  *CHILLS WITH OR WITHOUT FEVER  NAUSEA AND VOMITING THAT IS NOT CONTROLLED WITH YOUR NAUSEA MEDICATION  *UNUSUAL SHORTNESS OF BREATH  *UNUSUAL BRUISING OR BLEEDING  TENDERNESS IN MOUTH AND THROAT WITH OR WITHOUT PRESENCE OF ULCERS  *URINARY PROBLEMS  *BOWEL PROBLEMS  UNUSUAL RASH Items with * indicate a potential emergency and should be followed up as soon as possible.  One of the nurses will contact you 24 hours after your treatment. Please let the nurse know about any problems that you may have experienced. Feel free to call the clinic you have any questions or concerns. The clinic phone number is [phone].   I have been informed and understand all the instructions given to me. I know to contact the clinic, my physician, or go to the Emergency Department if any problems should occur. I do not have any questions at this time, but understand that I may call the clinic during office hours   should I have any questions or need assistance in obtaining follow up care.    __________________________________________  _____________  __________ Signature of Patient or Authorized Representative            Date                   Time    __________________________________________ Nurse's Signature

## 2012-09-19 NOTE — Progress Notes (Signed)
I spoke briefly with patient in chemotherapy. I provided recipes for smoothies at her request. I answered additional questions. Patient is appreciative of nutrition information.   Next visit: Tuesday, December 31, during chemotherapy.

## 2012-09-19 NOTE — Progress Notes (Signed)
Patient presented to MD office requesting wig voucher. Provided [employer] as requested.

## 2012-09-19 NOTE — Progress Notes (Signed)
Pt tolerated 1st dose of Taxol w/o any adverse reaction.  She did have intermittent facial flushing at the beginning and with the 1st 2 dose increases.  No sob, no itching and flushing resolved after several minutes.  No flushing at last dose increase. VSS and no adverse reactions noted.

## 2012-09-20 ENCOUNTER — Telehealth: Payer: Self-pay | Admitting: *Deleted

## 2012-09-20 ENCOUNTER — Ambulatory Visit (HOSPITAL_COMMUNITY): Payer: Medicare Other

## 2012-09-20 NOTE — Telephone Encounter (Signed)
Message copied by Wandalee Ferdinand on Wed Sep 20, 2012  5:06 PM ------      Message from: Wende Mott      Created: Tue Sep 19, 2012  3:58 PM      Regarding: chemo f/u call       1st time taxol/carbo on 09/19/12.  No adverse reaction.

## 2012-09-20 NOTE — Telephone Encounter (Signed)
Doing well s/p Taxol/Carboplatin. Confirmed she does not need to repeat her po steroids with consecutive chemo doses. Will still get IV steroid pretreatment.

## 2012-09-21 ENCOUNTER — Other Ambulatory Visit: Payer: Self-pay | Admitting: Radiology

## 2012-09-22 ENCOUNTER — Ambulatory Visit (HOSPITAL_COMMUNITY)
Admission: RE | Admit: 2012-09-22 | Discharge: 2012-09-22 | Disposition: A | Discharge status: Home / Self Care | Payer: Medicare Other | Source: Ambulatory Visit | Attending: Oncology | Admitting: Oncology

## 2012-09-22 ENCOUNTER — Other Ambulatory Visit: Payer: Self-pay | Admitting: Oncology

## 2012-09-22 DIAGNOSIS — Z90710 Acquired absence of both cervix and uterus: Secondary | ICD-10-CM | POA: Insufficient documentation

## 2012-09-22 DIAGNOSIS — Z79899 Other long term (current) drug therapy: Secondary | ICD-10-CM | POA: Insufficient documentation

## 2012-09-22 DIAGNOSIS — C569 Malignant neoplasm of unspecified ovary: Secondary | ICD-10-CM | POA: Insufficient documentation

## 2012-09-22 DIAGNOSIS — M069 Rheumatoid arthritis, unspecified: Secondary | ICD-10-CM | POA: Insufficient documentation

## 2012-09-22 LAB — CBC WITH DIFFERENTIAL/PLATELET
Basophils Absolute: 0 10*3/uL (ref 0.0–0.1)
HCT: 41 % (ref 36.0–46.0)
Hemoglobin: 13.5 g/dL (ref 12.0–15.0)
Lymphocytes Relative: 15 % (ref 12–46)
Monocytes Absolute: 0.3 10*3/uL (ref 0.1–1.0)
Monocytes Relative: 5 % (ref 3–12)
Neutro Abs: 4.1 10*3/uL (ref 1.7–7.7)
WBC: 5.3 10*3/uL (ref 4.0–10.5)

## 2012-09-22 LAB — APTT: aPTT: 23 seconds — ABNORMAL LOW (ref 24–37)

## 2012-09-22 MED ORDER — MIDAZOLAM HCL 2 MG/2ML IJ SOLN
INTRAMUSCULAR | Status: AC
  Filled 2012-09-22: qty 4

## 2012-09-22 MED ORDER — SODIUM CHLORIDE 0.9 % IV SOLN: INTRAVENOUS | Status: DC

## 2012-09-22 MED ORDER — FENTANYL CITRATE 0.05 MG/ML IJ SOLN
INTRAMUSCULAR | Status: AC | PRN
  Administered 2012-09-22: 100 ug via INTRAVENOUS

## 2012-09-22 MED ORDER — HEPARIN SOD (PORK) LOCK FLUSH 100 UNIT/ML IV SOLN
500.0000 [IU] | Freq: Once | INTRAVENOUS | Status: AC
  Administered 2012-09-22: 500 [IU] via INTRAVENOUS

## 2012-09-22 MED ORDER — MIDAZOLAM HCL 2 MG/2ML IJ SOLN
INTRAMUSCULAR | Status: AC | PRN
  Administered 2012-09-22: 2 mg via INTRAVENOUS

## 2012-09-22 MED ORDER — FENTANYL CITRATE 0.05 MG/ML IJ SOLN
INTRAMUSCULAR | Status: AC
  Filled 2012-09-22: qty 4

## 2012-09-22 MED ORDER — CEFAZOLIN SODIUM 1-5 GM-% IV SOLN
1.0000 g | Freq: Once | INTRAVENOUS | Status: AC
  Administered 2012-09-22: 1 g via INTRAVENOUS
  Filled 2012-09-22: qty 50

## 2012-09-22 NOTE — H&P (Signed)
[name] [name] is an 61 y.o. female.   Chief Complaint: "I'm here for a port a cath" HPI: Patient with history of metastatic serous ovarian carcinoma presents today for port a cath placement for chemotherapy.  Past Medical History  Diagnosis Date  . Rheumatoid arthritis   . Endometrial polyp   . Cystocele with uterine descensus     AND RECTOCELE  . Elevated cholesterol   . Cancer     Ovarian    Past Surgical History  Procedure Date  . Hand surgery 1996  . Tubal ligation 1980  . Hysteroscopy 2004    D&C  . Dilation and curettage of uterus 2004    WITH HYSTEROSCOPY  . Neck surgery 2009    SPURS  . Cyst on neck 2011  . Abdominal hysterectomy 08/22/2012    Procedure: HYSTERECTOMY ABDOMINAL;  Surgeon: Jeannette Corpus, MD;  Location: [facility];  Service: Gynecology;  Laterality: N/A;  . Salpingoophorectomy 08/22/2012    Procedure: SALPINGO OOPHORECTOMY;  Surgeon: Jeannette Corpus, MD;  Location: [facility];  Service: Gynecology;  Laterality: Bilateral;  . Omentectomy 08/22/2012    Procedure: OMENTECTOMY;  Surgeon: Jeannette Corpus, MD;  Location: [facility];  Service: Gynecology;  Laterality: N/A;  . Debulking 08/22/2012    Procedure: DEBULKING;  Surgeon: Jeannette Corpus, MD;  Location: [facility];  Service: Gynecology;  Laterality: N/A;  Radical tumor debulking, Bilateral Ureterolysis  . Colostomy revision 08/22/2012    Procedure: COLON RESECTION SIGMOID;  Surgeon: Jeannette Corpus, MD;  Location: [facility];  Service: Gynecology;;  Rectal Sigmoid resection and low rectal anastomosis    Family History  Problem Relation Age of Onset  . Heart disease Mother   . Osteoporosis Sister    Social History:  reports that she has never smoked. She does not have any smokeless tobacco history on file. She reports that she does not drink alcohol or use illicit drugs.  Allergies:  Allergies  Allergen Reactions  . Atorvastatin     REACTION: causes leg pain  .  Decongest-Aid (Pseudoephedrine)     Makes my heart flutter  . Macrodantin     Current outpatient prescriptions:ALPRAZolam (XANAX) 0.5 MG tablet, Take 1 tablet (0.5 mg total) by mouth every 8 (eight) hours as needed for sleep or anxiety., Disp: 30 tablet, Rfl: 0;  calcium-vitamin D (OSCAL WITH D) 500-200 MG-UNIT per tablet, Take 1 tablet by mouth daily.  , Disp: , Rfl:  dexamethasone (DECADRON) 4 MG tablet, Take 10 mg at bedtime night prior to 1st chemo and repeat at 6 am day of 1st chemo treatment ( 2 1/2 tablets each dose), Disp: 5 tablet, Rfl: 0;  enoxaparin (LOVENOX) 40 MG/0.4ML injection, Inject 0.4 mLs (40 mg total) into the skin daily., Disp: 16 Syringe, Rfl: 0;  fish oil-omega-3 fatty acids 1000 MG capsule, Take 1 g by mouth daily.  , Disp: , Rfl:  Flaxseed, Linseed, (FLAX SEED OIL) 1000 MG CAPS, Take 1 capsule by mouth daily., Disp: , Rfl: ;  lidocaine-prilocaine (EMLA) cream, Apply topically as needed. Apply to [facility] site 2 hours prior to stick and cover with plastic wrap to numb site, Disp: 30 g, Rfl: prn;  Multiple Vitamin (MULTIVITAMIN WITH MINERALS) TABS, Take 1 tablet by mouth daily., Disp: , Rfl:  oxyCODONE-acetaminophen (PERCOCET/ROXICET) 5-325 MG per tablet, Take 1-2 tablets by mouth every 6 (six) hours as needed (moderate to severe pain (when tolerating fluids))., Disp: 30 tablet, Rfl: 0;  pravastatin (PRAVACHOL) 40 MG tablet, Take 1  tablet (40 mg total) by mouth daily., Disp: 90 tablet, Rfl: 1;  prochlorperazine (COMPAZINE) 10 MG tablet, Take 1 tablet (10 mg total) by mouth every 6 (six) hours as needed., Disp: 60 tablet, Rfl: 2 Current facility-administered medications:0.9 %  sodium chloride infusion, , Intravenous, Continuous, Brayton El, PA;  ceFAZolin (ANCEF) IVPB 1 g/50 mL premix, 1 g, Intravenous, Once, Brayton El, PA   Results for orders placed during the hospital encounter of 09/22/12 (from the past 48 hour(s))  APTT     Status: Abnormal   Collection Time   09/22/12   8:35 AM      Component Value Range Comment   aPTT 23 (*) 24 - 37 seconds   CBC WITH DIFFERENTIAL     Status: Abnormal   Collection Time   09/22/12  8:35 AM      Component Value Range Comment   WBC 5.3  4.0 - 10.5 K/uL    RBC 4.24  3.87 - 5.11 MIL/uL    Hemoglobin 13.5  12.0 - 15.0 g/dL    HCT 40.9  81.1 - 91.4 %    MCV 96.7  78.0 - 100.0 fL    MCH 31.8  26.0 - 34.0 pg    MCHC 32.9  30.0 - 36.0 g/dL    RDW 78.2  95.6 - 21.3 %    Platelets 244  150 - 400 K/uL    Neutrophils Relative 79 (*) 43 - 77 %    Neutro Abs 4.1  1.7 - 7.7 K/uL    Lymphocytes Relative 15  12 - 46 %    Lymphs Abs 0.8  0.7 - 4.0 K/uL    Monocytes Relative 5  3 - 12 %    Monocytes Absolute 0.3  0.1 - 1.0 K/uL    Eosinophils Relative 1  0 - 5 %    Eosinophils Absolute 0.1  0.0 - 0.7 K/uL    Basophils Relative 0  0 - 1 %    Basophils Absolute 0.0  0.0 - 0.1 K/uL   PROTIME-INR     Status: Normal   Collection Time   09/22/12  8:35 AM      Component Value Range Comment   Prothrombin Time 12.0  11.6 - 15.2 seconds    INR 0.89  0.00 - 1.49    No results found.  Review of Systems  Constitutional: Negative for fever and chills.  Respiratory: Negative for cough and shortness of breath.   Cardiovascular: Negative for chest pain.  Gastrointestinal: Positive for abdominal pain. Negative for nausea, vomiting and blood in stool.  Genitourinary: Negative for dysuria and hematuria.  Musculoskeletal: Positive for back pain.  Neurological: Negative for headaches.  Endo/Heme/Allergies: Does not bruise/bleed easily.    Blood pressure 108/73, pulse 73, temperature 97.5 F (36.4 C), temperature source Oral, resp. rate 16, SpO2 99.00%. Physical Exam  Constitutional: She is oriented to person, place, and time. She appears well-developed and well-nourished.  Cardiovascular: Normal rate and regular rhythm.   Respiratory: Effort normal and breath sounds normal.  GI: Soft. There is tenderness.       Mildly distended; healed  midline incision; few ecchymotic areas from lovenox inj  Musculoskeletal: Normal range of motion. She exhibits no edema.  Neurological: She is alert and oriented to person, place, and time.     Assessment/Plan Patient with metastatic serous ovarian carcinoma. Plan is for port a cath placement today for chemotherapy. Details/risks of procedure d/w pt /pt's sister with their understanding and consent.  Alencia Gordon,D KEVIN 09/22/2012, 9:40 AM

## 2012-09-22 NOTE — Procedures (Signed)
Successful placement of right IJ approach port-a-cath with tip at the superior caval atrial junction. The catheter is ready for immediate use. No immediate post procedural complications. 

## 2012-09-24 ENCOUNTER — Other Ambulatory Visit: Payer: Self-pay | Admitting: Oncology

## 2012-09-26 ENCOUNTER — Ambulatory Visit (HOSPITAL_BASED_OUTPATIENT_CLINIC_OR_DEPARTMENT_OTHER): Payer: Medicare Other

## 2012-09-26 ENCOUNTER — Other Ambulatory Visit: Payer: Self-pay | Admitting: Oncology

## 2012-09-26 ENCOUNTER — Ambulatory Visit (HOSPITAL_BASED_OUTPATIENT_CLINIC_OR_DEPARTMENT_OTHER): Payer: Medicare Other | Admitting: Oncology

## 2012-09-26 ENCOUNTER — Other Ambulatory Visit (HOSPITAL_BASED_OUTPATIENT_CLINIC_OR_DEPARTMENT_OTHER): Payer: Medicare Other | Admitting: Lab

## 2012-09-26 VITALS — BP 121/82 | HR 90 | Temp 97.2°F | Resp 18 | Ht 65.0 in | Wt 140.8 lb

## 2012-09-26 DIAGNOSIS — Z5111 Encounter for antineoplastic chemotherapy: Secondary | ICD-10-CM

## 2012-09-26 DIAGNOSIS — C569 Malignant neoplasm of unspecified ovary: Secondary | ICD-10-CM

## 2012-09-26 DIAGNOSIS — F411 Generalized anxiety disorder: Secondary | ICD-10-CM

## 2012-09-26 DIAGNOSIS — G8929 Other chronic pain: Secondary | ICD-10-CM

## 2012-09-26 DIAGNOSIS — R109 Unspecified abdominal pain: Secondary | ICD-10-CM

## 2012-09-26 LAB — CBC WITH DIFFERENTIAL/PLATELET
Eosinophils Absolute: 0.1 10*3/uL (ref 0.0–0.5)
HCT: 38.9 % (ref 34.8–46.6)
LYMPH%: 21.4 % (ref 14.0–49.7)
MONO#: 0.2 10*3/uL (ref 0.1–0.9)
NEUT#: 3 10*3/uL (ref 1.5–6.5)
Platelets: 236 10*3/uL (ref 145–400)
RBC: 4.01 10*6/uL (ref 3.70–5.45)
WBC: 4.3 10*3/uL (ref 3.9–10.3)
nRBC: 0 % (ref 0–0)

## 2012-09-26 MED ORDER — PACLITAXEL CHEMO INJECTION 300 MG/50ML
80.0000 mg/m2 | Freq: Once | INTRAVENOUS | Status: AC
  Administered 2012-09-26: 138 mg via INTRAVENOUS
  Filled 2012-09-26: qty 23

## 2012-09-26 MED ORDER — FAMOTIDINE IN NACL 20-0.9 MG/50ML-% IV SOLN
20.0000 mg | Freq: Once | INTRAVENOUS | Status: AC
  Administered 2012-09-26: 20 mg via INTRAVENOUS

## 2012-09-26 MED ORDER — SODIUM CHLORIDE 0.9 % IJ SOLN
10.0000 mL | INTRAMUSCULAR | Status: DC | PRN
  Administered 2012-09-26: 10 mL
  Filled 2012-09-26: qty 10

## 2012-09-26 MED ORDER — ALPRAZOLAM 0.5 MG PO TABS: 0.5000 mg | ORAL_TABLET | Freq: Three times a day (TID) | ORAL | Status: DC | PRN

## 2012-09-26 MED ORDER — DEXAMETHASONE SODIUM PHOSPHATE 10 MG/ML IJ SOLN
10.0000 mg | Freq: Once | INTRAMUSCULAR | Status: AC
  Administered 2012-09-26: 10 mg via INTRAVENOUS

## 2012-09-26 MED ORDER — ONDANSETRON 8 MG/50ML IVPB (CHCC)
8.0000 mg | Freq: Once | INTRAVENOUS | Status: AC
  Administered 2012-09-26: 8 mg via INTRAVENOUS

## 2012-09-26 MED ORDER — DIPHENHYDRAMINE HCL 50 MG/ML IJ SOLN
25.0000 mg | Freq: Once | INTRAMUSCULAR | Status: AC
  Administered 2012-09-26: 25 mg via INTRAVENOUS

## 2012-09-26 MED ORDER — HEPARIN SOD (PORK) LOCK FLUSH 100 UNIT/ML IV SOLN
500.0000 [IU] | Freq: Once | INTRAVENOUS | Status: AC | PRN
  Administered 2012-09-26: 500 [IU]
  Filled 2012-09-26: qty 5

## 2012-09-26 MED ORDER — SODIUM CHLORIDE 0.9 % IV SOLN
Freq: Once | INTRAVENOUS | Status: AC
  Administered 2012-09-26: 13:00:00 via INTRAVENOUS

## 2012-09-26 NOTE — Patient Instructions (Addendum)
[facility] Discharge Instructions for Patients Receiving Chemotherapy  Today you received the following chemotherapy agents Taxol  To help prevent nausea and vomiting after your treatment, we encourage you to take your nausea medication as prescribed.   If you develop nausea and vomiting that is not controlled by your nausea medication, call the clinic. If it is after clinic hours your family physician or the after hours number for the clinic or go to the Emergency Department.   BELOW ARE SYMPTOMS THAT SHOULD BE REPORTED IMMEDIATELY:  *FEVER GREATER THAN 100.5 F  *CHILLS WITH OR WITHOUT FEVER  NAUSEA AND VOMITING THAT IS NOT CONTROLLED WITH YOUR NAUSEA MEDICATION  *UNUSUAL SHORTNESS OF BREATH  *UNUSUAL BRUISING OR BLEEDING  TENDERNESS IN MOUTH AND THROAT WITH OR WITHOUT PRESENCE OF ULCERS  *URINARY PROBLEMS  *BOWEL PROBLEMS  UNUSUAL RASH Items with * indicate a potential emergency and should be followed up as soon as possible.  Feel free to call the clinic you have any questions or concerns. The clinic phone number is [phone].   I have been informed and understand all the instructions given to me. I know to contact the clinic, my physician, or go to the Emergency Department if any problems should occur. I do not have any questions at this time, but understand that I may call the clinic during office hours   should I have any questions or need assistance in obtaining follow up care.   

## 2012-09-26 NOTE — Progress Notes (Signed)
   [name] [name]    OFFICE PROGRESS NOTE   INTERVAL HISTORY:   She completed a first cycle of Taxol/carboplatin chemotherapy on 09/19/2012. She tolerated chemotherapy well. No nausea, mouth sores, and neuropathy symptoms, or symptoms of an allergic reaction. She underwent placement of a Port-A-Cath on 09/22/2012.   Objective:  Vital signs in last 24 hours:  Blood pressure 121/82, pulse 90, temperature 97.2 F (36.2 C), temperature source Oral, resp. rate 18, height 5\' 5"  (1.651 m), weight 140 lb 12.8 oz (63.866 kg).    HEENT: No thrush or ulcers Resp: Lungs clear bilaterally Cardio: Regular rate and rhythm GI: No hepatomegaly, no mass Vascular: No leg edema   Portacath/PICC-without erythema, resolving ecchymosis  Lab Results:  Lab Results  Component Value Date   WBC 4.3 09/26/2012   HGB 12.7 09/26/2012   HCT 38.9 09/26/2012   MCV 97.0 09/26/2012   PLT 236 09/26/2012   ANC 3.0  The CA 125 on 09/11/2012-42.8  Medications: I have reviewed the patient's current medications.  Assessment/Plan: 1. 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 A 5 mm nodules remain on the right diaphragm.       -Cycle 1 of adjuvant Taxol/carboplatin chemotherapy initiated on 09/19/2012 2. Low abdomen/suprapubic pain prior to the exploratory laparotomy-likely secondary to omental/pelvic tumor  3. Chronic neck and back pain  4. Anxiety  5. Status post Port-A-Cath placement 09/22/2012  Disposition:  She tolerated the first treatment with Taxol/carboplatin well. She will complete day 8 and day 15 of cycle 1 over the next 2 weeks. [name] [name] will return for an office visit and cycle 2 on 10/11/2011.   Thornton Papas, MD  09/26/2012  1:49 PM

## 2012-09-28 ENCOUNTER — Telehealth: Payer: Self-pay | Admitting: *Deleted

## 2012-09-28 ENCOUNTER — Telehealth: Payer: Self-pay | Admitting: Oncology

## 2012-09-28 NOTE — Telephone Encounter (Signed)
s.w. pt and advised on 12.31.13 appt....pt aware and will come pick up updated schedule @ nxt visit

## 2012-09-28 NOTE — Telephone Encounter (Signed)
Per staff message and POF I have scheduled appts.  JMW  

## 2012-10-03 ENCOUNTER — Ambulatory Visit: Payer: Medicare Other | Admitting: Nutrition

## 2012-10-03 ENCOUNTER — Other Ambulatory Visit (HOSPITAL_BASED_OUTPATIENT_CLINIC_OR_DEPARTMENT_OTHER): Payer: Medicare Other | Admitting: Lab

## 2012-10-03 ENCOUNTER — Ambulatory Visit: Payer: Medicare Other

## 2012-10-03 DIAGNOSIS — C569 Malignant neoplasm of unspecified ovary: Secondary | ICD-10-CM

## 2012-10-03 LAB — CBC WITH DIFFERENTIAL/PLATELET
Basophils Absolute: 0 10*3/uL (ref 0.0–0.1)
Eosinophils Absolute: 0.1 10*3/uL (ref 0.0–0.5)
HCT: 38.7 % (ref 34.8–46.6)
HGB: 12.8 g/dL (ref 11.6–15.9)
MCH: 31.9 pg (ref 25.1–34.0)
MONO#: 0.3 10*3/uL (ref 0.1–0.9)
NEUT#: 0.7 10*3/uL — ABNORMAL LOW (ref 1.5–6.5)
NEUT%: 35.3 % — ABNORMAL LOW (ref 38.4–76.8)
lymph#: 0.9 10*3/uL (ref 0.9–3.3)

## 2012-10-03 NOTE — Progress Notes (Signed)
Pt ANC 0.7.  Per Lonna Cobb- hold treatment today.  Resume appts as scheduled next week.  Pt informed.  Gave instructions for neutropenic precautions.  Pt verbalized understanding.

## 2012-10-03 NOTE — Progress Notes (Signed)
Patient's weight continues to decline and was documented as 140.8 pounds down from usual body weight of 152 pounds in October. Patient continues to be concerned about her oral intake. Her chemotherapy is held today secondary to low counts. Patient has questions about her diet and how she can improve her counts.  Nutrition diagnosis: Food and nutrition related knowledge deficit continues.  Intervention: I educated patient to consume higher protein foods in 5-6 small meals/snacks daily. I've encouraged a variety of foods. Patient was educated to consume oral nutrition supplements as needed to help minimize further weight loss. I provided her with coupons today at her request. Patient able to teach back sources of protein.  Monitoring, evaluation, goals: Patient will tolerate oral diet to minimize further weight loss.  Next visit: Tuesday, January 7 during chemotherapy.

## 2012-10-07 ENCOUNTER — Other Ambulatory Visit: Payer: Self-pay | Admitting: Oncology

## 2012-10-09 ENCOUNTER — Other Ambulatory Visit: Payer: Medicare Other | Admitting: Lab

## 2012-10-09 ENCOUNTER — Other Ambulatory Visit (HOSPITAL_BASED_OUTPATIENT_CLINIC_OR_DEPARTMENT_OTHER): Payer: Medicare Other | Admitting: Lab

## 2012-10-09 DIAGNOSIS — C569 Malignant neoplasm of unspecified ovary: Secondary | ICD-10-CM

## 2012-10-09 LAB — COMPREHENSIVE METABOLIC PANEL (CC13)
CO2: 27 mEq/L (ref 22–29)
Calcium: 9.8 mg/dL (ref 8.4–10.4)
Chloride: 105 mEq/L (ref 98–107)
Creatinine: 0.8 mg/dL (ref 0.6–1.1)
Glucose: 91 mg/dl (ref 70–99)
Total Bilirubin: 0.31 mg/dL (ref 0.20–1.20)
Total Protein: 7 g/dL (ref 6.4–8.3)

## 2012-10-09 LAB — CBC WITH DIFFERENTIAL/PLATELET
Basophils Absolute: 0 10*3/uL (ref 0.0–0.1)
Eosinophils Absolute: 0 10*3/uL (ref 0.0–0.5)
HCT: 40.3 % (ref 34.8–46.6)
HGB: 13.5 g/dL (ref 11.6–15.9)
LYMPH%: 19.3 % (ref 14.0–49.7)
MCV: 98 fL (ref 79.5–101.0)
MONO#: 0.6 10*3/uL (ref 0.1–0.9)
MONO%: 13 % (ref 0.0–14.0)
NEUT#: 3 10*3/uL (ref 1.5–6.5)
Platelets: 202 10*3/uL (ref 145–400)

## 2012-10-10 ENCOUNTER — Encounter: Payer: Self-pay | Admitting: *Deleted

## 2012-10-10 ENCOUNTER — Ambulatory Visit: Payer: Medicare Other | Admitting: Nutrition

## 2012-10-10 ENCOUNTER — Ambulatory Visit (HOSPITAL_BASED_OUTPATIENT_CLINIC_OR_DEPARTMENT_OTHER): Payer: Medicare Other

## 2012-10-10 ENCOUNTER — Ambulatory Visit (HOSPITAL_BASED_OUTPATIENT_CLINIC_OR_DEPARTMENT_OTHER): Payer: Medicare Other | Admitting: Oncology

## 2012-10-10 VITALS — BP 127/83 | HR 106 | Temp 97.0°F | Resp 18 | Ht 65.0 in | Wt 141.9 lb

## 2012-10-10 DIAGNOSIS — C569 Malignant neoplasm of unspecified ovary: Secondary | ICD-10-CM

## 2012-10-10 DIAGNOSIS — Z5111 Encounter for antineoplastic chemotherapy: Secondary | ICD-10-CM

## 2012-10-10 MED ORDER — HEPARIN SOD (PORK) LOCK FLUSH 100 UNIT/ML IV SOLN
500.0000 [IU] | Freq: Once | INTRAVENOUS | Status: AC | PRN
  Administered 2012-10-10: 500 [IU]
  Filled 2012-10-10: qty 5

## 2012-10-10 MED ORDER — SODIUM CHLORIDE 0.9 % IV SOLN
Freq: Once | INTRAVENOUS | Status: AC
  Administered 2012-10-10: 10:00:00 via INTRAVENOUS

## 2012-10-10 MED ORDER — ONDANSETRON 16 MG/50ML IVPB (CHCC)
16.0000 mg | Freq: Once | INTRAVENOUS | Status: AC
  Administered 2012-10-10: 16 mg via INTRAVENOUS

## 2012-10-10 MED ORDER — DEXAMETHASONE SODIUM PHOSPHATE 10 MG/ML IJ SOLN
10.0000 mg | Freq: Once | INTRAMUSCULAR | Status: AC
  Administered 2012-10-10: 10 mg via INTRAVENOUS

## 2012-10-10 MED ORDER — SODIUM CHLORIDE 0.9 % IJ SOLN
10.0000 mL | INTRAMUSCULAR | Status: DC | PRN
  Administered 2012-10-10: 10 mL
  Filled 2012-10-10: qty 10

## 2012-10-10 MED ORDER — DIPHENHYDRAMINE HCL 50 MG/ML IJ SOLN
25.0000 mg | Freq: Once | INTRAMUSCULAR | Status: AC
  Administered 2012-10-10: 25 mg via INTRAVENOUS

## 2012-10-10 MED ORDER — PACLITAXEL CHEMO INJECTION 300 MG/50ML
64.0000 mg/m2 | Freq: Once | INTRAVENOUS | Status: AC
  Administered 2012-10-10: 108 mg via INTRAVENOUS
  Filled 2012-10-10: qty 18

## 2012-10-10 MED ORDER — SODIUM CHLORIDE 0.9 % IV SOLN
594.6000 mg | Freq: Once | INTRAVENOUS | Status: AC
  Administered 2012-10-10: 590 mg via INTRAVENOUS
  Filled 2012-10-10: qty 59

## 2012-10-10 MED ORDER — FAMOTIDINE IN NACL 20-0.9 MG/50ML-% IV SOLN
20.0000 mg | Freq: Once | INTRAVENOUS | Status: AC
  Administered 2012-10-10: 20 mg via INTRAVENOUS

## 2012-10-10 NOTE — Progress Notes (Signed)
Patient reports good appetite and denies any nutritional side effects at this time. She is consuming either Ensure or [employer] Breakfast one to 2 bottles daily. Her weight was documented as 141.9 pounds January 7 from 140.8 pounds on December 24.  Nutrition diagnosis: Food and nutrition related knowledge deficit has improved.  Intervention: I have enforced the importance of frequent small meals with higher protein foods and adequate calories to promote weight maintenance. Patient is to continue oral nutrition supplements once to twice daily.  Monitoring, evaluation, goals: Patient is tolerating oral diet with oral nutrition supplements and has had a slight weight gain.  Next visit: Tuesday, January 14, during chemotherapy.

## 2012-10-10 NOTE — Progress Notes (Signed)
Patient admits to periods of sadness, depression and crying. Xanax helps her. Suggested talking to clinical social worker to learn how to deal with her emotions and stress and learn more about support groups. She expresses interest in this. Social worker, Building control surveyor notified and will try to see her in treatment area today.

## 2012-10-10 NOTE — Progress Notes (Signed)
   [facility]    OFFICE PROGRESS NOTE   INTERVAL HISTORY:   She returns as scheduled. She began a first cycle of Taxol/carboplatin chemotherapy 09/19/2012. No nausea, and neuropathy symptoms, mouth sores, or diarrhea. She reports mild discomfort near a right low abdomen drain site. Day 15 chemotherapy was held on 10/03/2012 secondary to neutropenia.    Objective:  Vital signs in last 24 hours:  Blood pressure 127/83, pulse 106, temperature 97 F (36.1 C), temperature source Oral, resp. rate 18, height 5\' 5"  (1.651 m), weight 141 lb 14.4 oz (64.365 kg).    HEENT: No thrush or ulcer Resp: Lungs clear bilaterally Cardio: Regular rate and rhythm GI: No hepatosplenomegaly, no mass, nontender Vascular: No leg edema   Portacath/PICC-without erythema  Lab Results:  Lab Results  Component Value Date   WBC 4.5 10/09/2012   HGB 13.5 10/09/2012   HCT 40.3 10/09/2012   MCV 98.0 10/09/2012   PLT 202 10/09/2012   ANC 3.0  CA -125,  9.5 Medications: I have reviewed the patient's current medications.  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 A 5 mm nodules remain on the right diaphragm.       -Cycle 1 of adjuvant Taxol/carboplatin chemotherapy initiated on 09/19/2012  2. Low abdomen/suprapubic pain prior to the exploratory laparotomy-likely secondary to omental/pelvic tumor  3. Chronic neck and back pain  4. Anxiety  5. Status post Port-A-Cath placement 09/22/2012 6. Neutropenia secondary to chemotherapy, day 15 cycle 1 Taxol/carboplatin was not given on 10/03/2012  Disposition:  She tolerated the first cycle of Taxol/carboplatin without significant toxicity aside from neutropenia. The plan is to proceed with cycle 2 today. The Taxol and carboplatin will be dose reduced with this cycle. She will return  for an office visit on 11/01/2011. I suspect the mild abdominal discomfort is related to postoperative pain.   Thornton Papas, MD  10/10/2012  6:23 PM

## 2012-10-10 NOTE — Patient Instructions (Addendum)
[facility] Discharge Instructions for Patients Receiving Chemotherapy  Today you received the following chemotherapy agents :  Taxol,  Carboplatin.  To help prevent nausea and vomiting after your treatment, we encourage you to take your nausea medication as instructed by your physician.    If you develop nausea and vomiting that is not controlled by your nausea medication, call the clinic. If it is after clinic hours your family physician or the after hours number for the clinic or go to the Emergency Department.   BELOW ARE SYMPTOMS THAT SHOULD BE REPORTED IMMEDIATELY:  *FEVER GREATER THAN 100.5 F  *CHILLS WITH OR WITHOUT FEVER  NAUSEA AND VOMITING THAT IS NOT CONTROLLED WITH YOUR NAUSEA MEDICATION  *UNUSUAL SHORTNESS OF BREATH  *UNUSUAL BRUISING OR BLEEDING  TENDERNESS IN MOUTH AND THROAT WITH OR WITHOUT PRESENCE OF ULCERS  *URINARY PROBLEMS  *BOWEL PROBLEMS  UNUSUAL RASH Items with * indicate a potential emergency and should be followed up as soon as possible.  One of the nurses will contact you 24 hours after your treatment. Please let the nurse know about any problems that you may have experienced. Feel free to call the clinic you have any questions or concerns. The clinic phone number is [phone].   I have been informed and understand all the instructions given to me. I know to contact the clinic, my physician, or go to the Emergency Department if any problems should occur. I do not have any questions at this time, but understand that I may call the clinic during office hours   should I have any questions or need assistance in obtaining follow up care.    __________________________________________  _____________  __________ Signature of Patient or Authorized Representative            Date                   Time    __________________________________________ Nurse's Signature    

## 2012-10-10 NOTE — Progress Notes (Signed)
[facility] Brief Psychosocial Assessment Clinical Social Work  Clinical Social Work was referred by Charity fundraiser for assessment of psychosocial needs.  Clinical Social Worker met with patient in treatment room to offer support and assess for needs.  Pt stated she was doing "ok", but experiences occasional sadness. CSW validated and normalized pt's feelings.  CSW offered pt additional support and provided information on the [facility] Support Team ans support services.  Pt stated she was interested in the GYN support group and plans to attend the next meeting.  CSW encouraged pt to call with any additional questions or concerns.        Tamala Julian, MSW, LCSW Clinical Social Worker [facility] [phone]

## 2012-10-11 ENCOUNTER — Telehealth: Payer: Self-pay | Admitting: Oncology

## 2012-10-11 ENCOUNTER — Telehealth: Payer: Self-pay | Admitting: *Deleted

## 2012-10-11 NOTE — Telephone Encounter (Signed)
called pt and she did not get a sch today as we were workingon chemo and she stated she will get one on 1/14     anne

## 2012-10-11 NOTE — Telephone Encounter (Signed)
Per staff message I have adjusted appt. JMW  

## 2012-10-12 ENCOUNTER — Telehealth: Payer: Self-pay | Admitting: Oncology

## 2012-10-12 NOTE — Telephone Encounter (Signed)
Talked to patient and gave her appt for 10/17/12 lab and chemo , advised patient to get calendar for January and February 2014

## 2012-10-15 ENCOUNTER — Other Ambulatory Visit: Payer: Self-pay | Admitting: Oncology

## 2012-10-17 ENCOUNTER — Other Ambulatory Visit: Payer: Self-pay | Admitting: Oncology

## 2012-10-17 ENCOUNTER — Ambulatory Visit (HOSPITAL_BASED_OUTPATIENT_CLINIC_OR_DEPARTMENT_OTHER): Payer: Medicare Other

## 2012-10-17 ENCOUNTER — Ambulatory Visit: Payer: Medicare Other | Admitting: Nutrition

## 2012-10-17 ENCOUNTER — Other Ambulatory Visit (HOSPITAL_BASED_OUTPATIENT_CLINIC_OR_DEPARTMENT_OTHER): Payer: Medicare Other | Admitting: Lab

## 2012-10-17 VITALS — BP 123/78 | HR 84 | Temp 98.1°F

## 2012-10-17 DIAGNOSIS — C569 Malignant neoplasm of unspecified ovary: Secondary | ICD-10-CM

## 2012-10-17 DIAGNOSIS — Z5111 Encounter for antineoplastic chemotherapy: Secondary | ICD-10-CM

## 2012-10-17 LAB — CBC WITH DIFFERENTIAL/PLATELET
Basophils Absolute: 0 10*3/uL (ref 0.0–0.1)
EOS%: 2.3 % (ref 0.0–7.0)
HCT: 38.1 % (ref 34.8–46.6)
HGB: 12.4 g/dL (ref 11.6–15.9)
MCH: 31.8 pg (ref 25.1–34.0)
MONO#: 0.3 10*3/uL (ref 0.1–0.9)
NEUT%: 66.9 % (ref 38.4–76.8)
lymph#: 1 10*3/uL (ref 0.9–3.3)

## 2012-10-17 MED ORDER — PACLITAXEL CHEMO INJECTION 300 MG/50ML
64.0000 mg/m2 | Freq: Once | INTRAVENOUS | Status: AC
  Administered 2012-10-17: 108 mg via INTRAVENOUS
  Filled 2012-10-17: qty 18

## 2012-10-17 MED ORDER — DEXAMETHASONE SODIUM PHOSPHATE 10 MG/ML IJ SOLN
10.0000 mg | Freq: Once | INTRAMUSCULAR | Status: AC
  Administered 2012-10-17: 10 mg via INTRAVENOUS

## 2012-10-17 MED ORDER — SODIUM CHLORIDE 0.9 % IJ SOLN
10.0000 mL | INTRAMUSCULAR | Status: DC | PRN
Start: 2012-10-17 — End: 2012-10-17
  Administered 2012-10-17: 10 mL
  Filled 2012-10-17: qty 10

## 2012-10-17 MED ORDER — HEPARIN SOD (PORK) LOCK FLUSH 100 UNIT/ML IV SOLN
500.0000 [IU] | Freq: Once | INTRAVENOUS | Status: AC | PRN
  Administered 2012-10-17: 500 [IU]
  Filled 2012-10-17: qty 5

## 2012-10-17 MED ORDER — FAMOTIDINE IN NACL 20-0.9 MG/50ML-% IV SOLN
20.0000 mg | Freq: Once | INTRAVENOUS | Status: AC
  Administered 2012-10-17: 20 mg via INTRAVENOUS

## 2012-10-17 MED ORDER — ONDANSETRON 8 MG/50ML IVPB (CHCC)
8.0000 mg | Freq: Once | INTRAVENOUS | Status: AC
  Administered 2012-10-17: 8 mg via INTRAVENOUS

## 2012-10-17 MED ORDER — DIPHENHYDRAMINE HCL 50 MG/ML IJ SOLN
25.0000 mg | Freq: Once | INTRAMUSCULAR | Status: AC
  Administered 2012-10-17: 25 mg via INTRAVENOUS

## 2012-10-17 MED ORDER — SODIUM CHLORIDE 0.9 % IV SOLN
Freq: Once | INTRAVENOUS | Status: AC
  Administered 2012-10-17: 10:00:00 via INTRAVENOUS

## 2012-10-17 NOTE — Patient Instructions (Signed)
[facility] Discharge Instructions for Patients Receiving Chemotherapy  Today you received the following chemotherapy agents: taxol  To help prevent nausea and vomiting after your treatment, we encourage you to take your nausea medication.  Take it as often as prescribed.     If you develop nausea and vomiting that is not controlled by your nausea medication, call the clinic. If it is after clinic hours your family physician or the after hours number for the clinic or go to the Emergency Department.   BELOW ARE SYMPTOMS THAT SHOULD BE REPORTED IMMEDIATELY:  *FEVER GREATER THAN 100.5 F  *CHILLS WITH OR WITHOUT FEVER  NAUSEA AND VOMITING THAT IS NOT CONTROLLED WITH YOUR NAUSEA MEDICATION  *UNUSUAL SHORTNESS OF BREATH  *UNUSUAL BRUISING OR BLEEDING  TENDERNESS IN MOUTH AND THROAT WITH OR WITHOUT PRESENCE OF ULCERS  *URINARY PROBLEMS  *BOWEL PROBLEMS  UNUSUAL RASH Items with * indicate a potential emergency and should be followed up as soon as possible.  Feel free to call the clinic you have any questions or concerns. The clinic phone number is [phone].   I have been informed and understand all the instructions given to me. I know to contact the clinic, my physician, or go to the Emergency Department if any problems should occur. I do not have any questions at this time, but understand that I may call the clinic during office hours   should I have any questions or need assistance in obtaining follow up care.    __________________________________________  _____________  __________ Signature of Patient or Authorized Representative            Date                   Time    __________________________________________ Nurse's Signature    

## 2012-10-17 NOTE — Progress Notes (Signed)
Patient complains of constipation over the past week. She reports this is secondary to increased intake of cheese. She has been drinking ensure. She has no other nutrition concerns today. Her weight was documented as 141.9 pounds January 7. There is no weight documented this week.  Nutrition diagnosis: Food and nutrition related knowledge deficit continues to improve.  Intervention: I educated patient on strategies for eating to prevent constipation. I've encouraged increased fluid intake. I've encouraged her to discuss need for stool softeners with her physician.  Monitoring, evaluation, goals: Patient continues to tolerate oral diet with oral nutrition supplements twice a day.  Next visit: Tuesday, January 21, during chemotherapy.

## 2012-10-18 ENCOUNTER — Encounter: Payer: Self-pay | Admitting: Gynecology

## 2012-10-18 ENCOUNTER — Ambulatory Visit: Payer: Medicare Other | Attending: Gynecology | Admitting: Gynecology

## 2012-10-18 VITALS — BP 134/88 | HR 88 | Temp 98.6°F | Resp 20 | Ht 65.5 in | Wt 140.0 lb

## 2012-10-18 DIAGNOSIS — Z09 Encounter for follow-up examination after completed treatment for conditions other than malignant neoplasm: Secondary | ICD-10-CM | POA: Insufficient documentation

## 2012-10-18 DIAGNOSIS — M069 Rheumatoid arthritis, unspecified: Secondary | ICD-10-CM | POA: Insufficient documentation

## 2012-10-18 DIAGNOSIS — K5900 Constipation, unspecified: Secondary | ICD-10-CM | POA: Insufficient documentation

## 2012-10-18 DIAGNOSIS — Z90710 Acquired absence of both cervix and uterus: Secondary | ICD-10-CM | POA: Insufficient documentation

## 2012-10-18 DIAGNOSIS — Z9079 Acquired absence of other genital organ(s): Secondary | ICD-10-CM | POA: Insufficient documentation

## 2012-10-18 DIAGNOSIS — C569 Malignant neoplasm of unspecified ovary: Secondary | ICD-10-CM | POA: Insufficient documentation

## 2012-10-18 DIAGNOSIS — Z79899 Other long term (current) drug therapy: Secondary | ICD-10-CM | POA: Insufficient documentation

## 2012-10-18 NOTE — Progress Notes (Signed)
Consult Note: Gyn-Onc   [name] [name] 62 y.o. female  Chief Complaint  Patient presents with  . Ovarian Cancer    Follow up    Interval History: The patient returns today as previously scheduled for followup. She is now completed one full cycle of carboplatin and Taxol using the dose dense call. Date 15 was held because of neutropenia. The patient seems to be tolerating chemotherapy well except for some difficulties with constipation. She's using MiraLAX when necessary and Colace when necessary. She is number questions about managing her constipation.  Otherwise, she sees be tolerating chemotherapy well and has recovered from surgery. She still has some pain at her right lower quadrant drain site. She denies any vaginal bleeding discharge or pelvic pain. She has no other GI or GU symptoms.  QMV:HQIONGE is a 62 year old gravida 2 para 2 who went through menopause at the age about 62 years of age. She never took any hormone replacement therapy. She states that for the past 6 months she's experienced some pelvic soreness in pressure. She has noted some increasing urinary frequency. With these symptoms she had a PET scan on October 9. It revealed significant soft tissue thickening seen throughout the greater omentum suspicious for intraperitoneal carcinoma. There is minimal ascites in the pelvis. it revealed the areas of omental and peritoneal nodularity were hypermetabolic. There is a dominant pelvic omental mass measuring 10.4 x 3.3 cm. There is a small omental implants in the left side of the abdomen measuring 1.1 cm. There were multiple pelvic foci That wereperitoneal based And hypermetabolic. These included the cul-de-sac. She has small volume of ascites. She underwent a CT-guided biopsy on July 24, 2012. It revealed metastatic carcinoma. By immunohistochemistry, malignant cells were positive for WT 1, cytokeratin 7, estrogen receptor. There were negative for TTF-1, progesterone receptor,  cytokeratin 20, CEA, CD X2, and S100. Overall, the histologic features were highly suggestive of a primary gynecologic origin. Her CA 125 on October 10 was elevated at 69.  She underwent exploratory laparotomy on 08/22/2012 findings of stage IIIC ovarian cancer. Patient was optimally debulked. Debulking, however, required a rectosigmoid resection as well as total omentectomy total nominal hysterectomy bilateral salpingo-oophorectomy. She was optimally debulked with only 5 mm nodules on the right diaphragm as residual disease.  SURGICAL FINDINGS: At exploratory laparotomy the infracolic omentum was nearly replaced with tumor. This measured approximately 12 x 16 cm. There were 5 mm nodules on the right diaphragm. The small bowel serosa and mesentery were normal. In the pelvis it appeared that the left ovary was a primary source. It was cystic and densely adherent to the sigmoid mesentery. The right ovary was approximately 4 cm in diameter and had multiple excrescences on it. There were extensive tumor implants on the bladder flap and involving the sigmoid mesentery and serosa, and posterior cul-de-sac. At the conclusion of the surgical procedure the only residual disease were 5 mm nodules on the right diaphragm. Chemotherapy was initiated in December 2013 using the dose dense regimen of carboplatin and Taxol.  Review of Systems:10 point review of systems is negative as noted above.   Vitals: Blood pressure 134/88, pulse 88, temperature 98.6 F (37 C), temperature source Oral, resp. rate 20, height 5' 5.5" (1.664 m), weight 140 lb (63.504 kg).  Physical Exam: General : The patient is a healthy woman in no acute distress.  HEENT: normocephalic, extraoccular movements normal; neck is supple without thyromegally  Lynphnodes: Supraclavicular and inguinal nodes not enlarged  Abdomen: Soft, non-tender,  no ascites, no organomegally, no masses, no hernias  Pelvic:  EGBUS: Normal female  Vagina: Normal, no  lesions  Urethra and Bladder: Normal, non-tender  Cervix: Surgically absent  Uterus: Surgically absent  Bi-manual examination: Non-tender; no adenxal masses or nodularity  Rectal: normal sphincter tone, no masses, no blood  Lower extremities: No edema or varicosities. Normal range of motion    Assessment/Plan: Stage III C. optimal ovarian cancer. She will continue receiving carboplatin and Taxol. I would like to see her back after 5 cycles. After the sixth cycle we will obtain a CT scan of the chest abdomen and pelvis. She will continue to have CA 125 values drawn every 3 weeks. It's noted that her current CA 125 value is 9.5 down from a high of 69 units per mL.  We discussed management constipation the patient's encouraged use MiraLAX more often.  Allergies  Allergen Reactions  . Atorvastatin     REACTION: causes leg pain  . Decongest-Aid (Pseudoephedrine)     Makes my heart flutter  . Macrodantin     Past Medical History  Diagnosis Date  . Rheumatoid arthritis   . Endometrial polyp   . Cystocele with uterine descensus     AND RECTOCELE  . Elevated cholesterol   . Cancer     Ovarian    Past Surgical History  Procedure Date  . Hand surgery 1996  . Tubal ligation 1980  . Hysteroscopy 2004    D&C  . Dilation and curettage of uterus 2004    WITH HYSTEROSCOPY  . Neck surgery 2009    SPURS  . Cyst on neck 2011  . Abdominal hysterectomy 08/22/2012    Procedure: HYSTERECTOMY ABDOMINAL;  Surgeon: Jeannette Corpus, MD;  Location: [facility];  Service: Gynecology;  Laterality: N/A;  . Salpingoophorectomy 08/22/2012    Procedure: SALPINGO OOPHORECTOMY;  Surgeon: Jeannette Corpus, MD;  Location: [facility];  Service: Gynecology;  Laterality: Bilateral;  . Omentectomy 08/22/2012    Procedure: OMENTECTOMY;  Surgeon: Jeannette Corpus, MD;  Location: [facility];  Service: Gynecology;  Laterality: N/A;  . Debulking 08/22/2012    Procedure: DEBULKING;  Surgeon: Jeannette Corpus, MD;  Location: [facility];  Service: Gynecology;  Laterality: N/A;  Radical tumor debulking, Bilateral Ureterolysis  . Colostomy revision 08/22/2012    Procedure: COLON RESECTION SIGMOID;  Surgeon: Jeannette Corpus, MD;  Location: [facility];  Service: Gynecology;;  Rectal Sigmoid resection and low rectal anastomosis    Current Outpatient Prescriptions  Medication Sig Dispense Refill  . ALPRAZolam (XANAX) 0.5 MG tablet Take 1 tablet (0.5 mg total) by mouth every 8 (eight) hours as needed for sleep or anxiety.  30 tablet  1  . calcium-vitamin D (OSCAL WITH D) 500-200 MG-UNIT per tablet Take 1 tablet by mouth daily.        . fish oil-omega-3 fatty acids 1000 MG capsule Take 1 g by mouth daily.        . Flaxseed, Linseed, (FLAX SEED OIL) 1000 MG CAPS Take 1 capsule by mouth daily.      [name] lidocaine-prilocaine (EMLA) cream Apply topically as needed. Apply to [facility] site 2 hours prior to stick and cover with plastic wrap to numb site  30 g  prn  . Multiple Vitamin (MULTIVITAMIN WITH MINERALS) TABS Take 1 tablet by mouth daily.      [name] oxyCODONE-acetaminophen (PERCOCET/ROXICET) 5-325 MG per tablet Take 1-2 tablets by mouth every 6 (six) hours as needed (moderate to severe pain (  when tolerating fluids)).  30 tablet  0  . pravastatin (PRAVACHOL) 40 MG tablet Take 1 tablet (40 mg total) by mouth daily.  90 tablet  1  . prochlorperazine (COMPAZINE) 10 MG tablet Take 1 tablet (10 mg total) by mouth every 6 (six) hours as needed.  60 tablet  2    History   Social History  . Marital Status: Married    Spouse Name: N/A    Number of Children: N/A  . Years of Education: N/A   Occupational History  . Not on file.   Social History Main Topics  . Smoking status: Never Smoker   . Smokeless tobacco: Not on file  . Alcohol Use: No  . Drug Use: No  . Sexually Active: No   Other Topics Concern  . Not on file   Social History Narrative  . No narrative on file    Family History  Problem  Relation Age of Onset  . Heart disease Mother   . Osteoporosis Sister       Jeannette Corpus, MD 10/18/2012, 12:10 PM

## 2012-10-18 NOTE — Patient Instructions (Signed)
Plan on seeing me after 5 cycles of carboplatin and Taxol chemotherapy.

## 2012-10-20 ENCOUNTER — Telehealth: Payer: Self-pay | Admitting: *Deleted

## 2012-10-20 NOTE — Telephone Encounter (Signed)
@  0850-VM reporting nosebleed last night.

## 2012-10-20 NOTE — Telephone Encounter (Signed)
@  1150-Called patient back. Nosebleed only lasted few minutes. Reports she has never had a nosebleed before and was frightened. Made her aware that her platelet count is normal. Nasal mucosa could be dry from the chemo and dry winter air. Suggested she humidify the air in her home and obtain saline nasal spray and use couple times daily to keep nasal passages moist.

## 2012-10-23 ENCOUNTER — Ambulatory Visit (HOSPITAL_COMMUNITY)
Admission: RE | Admit: 2012-10-23 | Discharge: 2012-10-23 | Disposition: A | Discharge status: Home / Self Care | Payer: Medicare Other | Source: Ambulatory Visit | Attending: Obstetrics and Gynecology | Admitting: Obstetrics and Gynecology

## 2012-10-23 DIAGNOSIS — Z1231 Encounter for screening mammogram for malignant neoplasm of breast: Secondary | ICD-10-CM | POA: Insufficient documentation

## 2012-10-24 ENCOUNTER — Other Ambulatory Visit: Payer: Self-pay | Admitting: Oncology

## 2012-10-24 ENCOUNTER — Ambulatory Visit (HOSPITAL_BASED_OUTPATIENT_CLINIC_OR_DEPARTMENT_OTHER): Payer: Medicare Other

## 2012-10-24 ENCOUNTER — Other Ambulatory Visit (HOSPITAL_BASED_OUTPATIENT_CLINIC_OR_DEPARTMENT_OTHER): Payer: Medicare Other | Admitting: Lab

## 2012-10-24 ENCOUNTER — Ambulatory Visit: Payer: Medicare Other | Admitting: Nutrition

## 2012-10-24 VITALS — BP 148/96 | HR 96 | Temp 97.7°F

## 2012-10-24 DIAGNOSIS — Z5111 Encounter for antineoplastic chemotherapy: Secondary | ICD-10-CM

## 2012-10-24 DIAGNOSIS — C569 Malignant neoplasm of unspecified ovary: Secondary | ICD-10-CM

## 2012-10-24 LAB — CBC WITH DIFFERENTIAL/PLATELET
BASO%: 0.4 % (ref 0.0–2.0)
Basophils Absolute: 0 10*3/uL (ref 0.0–0.1)
EOS%: 1.1 % (ref 0.0–7.0)
MCH: 31.8 pg (ref 25.1–34.0)
MCHC: 32.8 g/dL (ref 31.5–36.0)
MCV: 97 fL (ref 79.5–101.0)
MONO%: 13.2 % (ref 0.0–14.0)
RDW: 14.4 % (ref 11.2–14.5)
lymph#: 1 10*3/uL (ref 0.9–3.3)

## 2012-10-24 MED ORDER — FAMOTIDINE IN NACL 20-0.9 MG/50ML-% IV SOLN
20.0000 mg | Freq: Once | INTRAVENOUS | Status: AC
  Administered 2012-10-24: 20 mg via INTRAVENOUS

## 2012-10-24 MED ORDER — ONDANSETRON 8 MG/50ML IVPB (CHCC)
8.0000 mg | Freq: Once | INTRAVENOUS | Status: AC
  Administered 2012-10-24: 8 mg via INTRAVENOUS

## 2012-10-24 MED ORDER — DIPHENHYDRAMINE HCL 50 MG/ML IJ SOLN
25.0000 mg | Freq: Once | INTRAMUSCULAR | Status: AC
  Administered 2012-10-24: 25 mg via INTRAVENOUS

## 2012-10-24 MED ORDER — HEPARIN SOD (PORK) LOCK FLUSH 100 UNIT/ML IV SOLN
500.0000 [IU] | Freq: Once | INTRAVENOUS | Status: AC | PRN
  Administered 2012-10-24: 500 [IU]
  Filled 2012-10-24: qty 5

## 2012-10-24 MED ORDER — DEXAMETHASONE SODIUM PHOSPHATE 10 MG/ML IJ SOLN
10.0000 mg | Freq: Once | INTRAMUSCULAR | Status: AC
  Administered 2012-10-24: 10 mg via INTRAVENOUS

## 2012-10-24 MED ORDER — SODIUM CHLORIDE 0.9 % IV SOLN
Freq: Once | INTRAVENOUS | Status: AC
  Administered 2012-10-24: 11:00:00 via INTRAVENOUS

## 2012-10-24 MED ORDER — SODIUM CHLORIDE 0.9 % IJ SOLN
10.0000 mL | INTRAMUSCULAR | Status: DC | PRN
  Administered 2012-10-24: 10 mL
  Filled 2012-10-24: qty 10

## 2012-10-24 MED ORDER — PACLITAXEL CHEMO INJECTION 300 MG/50ML
64.0000 mg/m2 | Freq: Once | INTRAVENOUS | Status: AC
  Administered 2012-10-24: 108 mg via INTRAVENOUS
  Filled 2012-10-24: qty 18

## 2012-10-24 NOTE — Progress Notes (Signed)
Blood pressure recheck per pt request.

## 2012-10-24 NOTE — Progress Notes (Signed)
Pt's ANC 1.3.  Dr. Truett Perna aware.  Per Dr. Truett Perna it is ok to proceed with chemo today.  Pt given instructions about infection prevention and to call if she has any signs and symptoms of an infection.  Pt told to call for fever, discolored sputum, chills with or without fever.  Pt verbalized understanding.

## 2012-10-24 NOTE — Progress Notes (Signed)
Brief followup with patient in chemotherapy. She is eating well. She continues to drink ensure or Carnation breakfast essentials once daily. Her weight is stable at 140 pounds documented January 15. She has no nutrition concerns.  Nutrition diagnosis of food and nutrition related knowledge deficit has resolved.   I will follow up with patient on Tuesday, February 11 per her request.

## 2012-10-24 NOTE — Patient Instructions (Addendum)
[facility] Discharge Instructions for Patients Receiving Chemotherapy  Today you received the following chemotherapy agents Taxol.  To help prevent nausea and vomiting after your treatment, we encourage you to take your nausea medication as prescribed. Begin taking it as directed and take it as often as prescribed for the next 48-72 hours.   If you develop nausea and vomiting that is not controlled by your nausea medication, call the clinic. If it is after clinic hours your family physician or the after hours number for the clinic or go to the Emergency Department.   BELOW ARE SYMPTOMS THAT SHOULD BE REPORTED IMMEDIATELY:  *FEVER GREATER THAN 100.5 F  *CHILLS WITH OR WITHOUT FEVER  NAUSEA AND VOMITING THAT IS NOT CONTROLLED WITH YOUR NAUSEA MEDICATION  *UNUSUAL SHORTNESS OF BREATH  *UNUSUAL BRUISING OR BLEEDING  TENDERNESS IN MOUTH AND THROAT WITH OR WITHOUT PRESENCE OF ULCERS  *URINARY PROBLEMS  *BOWEL PROBLEMS  UNUSUAL RASH Items with * indicate a potential emergency and should be followed up as soon as possible.  Feel free to call the clinic you have any questions or concerns. The clinic phone number is [phone].

## 2012-10-25 ENCOUNTER — Other Ambulatory Visit: Payer: Self-pay | Admitting: Certified Registered Nurse Anesthetist

## 2012-10-28 ENCOUNTER — Other Ambulatory Visit: Payer: Self-pay | Admitting: Oncology

## 2012-10-31 ENCOUNTER — Encounter: Payer: Self-pay | Admitting: *Deleted

## 2012-10-31 ENCOUNTER — Ambulatory Visit (HOSPITAL_COMMUNITY)
Admission: RE | Admit: 2012-10-31 | Discharge: 2012-10-31 | Disposition: A | Discharge status: Home / Self Care | Payer: Medicare Other | Source: Ambulatory Visit | Attending: Nurse Practitioner | Admitting: Nurse Practitioner

## 2012-10-31 ENCOUNTER — Ambulatory Visit (HOSPITAL_BASED_OUTPATIENT_CLINIC_OR_DEPARTMENT_OTHER): Payer: Medicare Other | Admitting: Oncology

## 2012-10-31 ENCOUNTER — Other Ambulatory Visit: Payer: Self-pay | Admitting: *Deleted

## 2012-10-31 ENCOUNTER — Encounter: Payer: Medicare Other | Admitting: Nutrition

## 2012-10-31 ENCOUNTER — Ambulatory Visit (HOSPITAL_BASED_OUTPATIENT_CLINIC_OR_DEPARTMENT_OTHER): Payer: Medicare Other

## 2012-10-31 ENCOUNTER — Other Ambulatory Visit: Payer: Self-pay | Admitting: Nurse Practitioner

## 2012-10-31 ENCOUNTER — Other Ambulatory Visit (HOSPITAL_BASED_OUTPATIENT_CLINIC_OR_DEPARTMENT_OTHER): Payer: Medicare Other | Admitting: Lab

## 2012-10-31 VITALS — BP 131/89 | HR 108 | Temp 98.1°F | Resp 20 | Ht 65.5 in | Wt 139.3 lb

## 2012-10-31 DIAGNOSIS — D702 Other drug-induced agranulocytosis: Secondary | ICD-10-CM

## 2012-10-31 DIAGNOSIS — C569 Malignant neoplasm of unspecified ovary: Secondary | ICD-10-CM | POA: Insufficient documentation

## 2012-10-31 DIAGNOSIS — Z452 Encounter for adjustment and management of vascular access device: Secondary | ICD-10-CM | POA: Insufficient documentation

## 2012-10-31 DIAGNOSIS — Z5111 Encounter for antineoplastic chemotherapy: Secondary | ICD-10-CM

## 2012-10-31 DIAGNOSIS — F411 Generalized anxiety disorder: Secondary | ICD-10-CM

## 2012-10-31 DIAGNOSIS — R109 Unspecified abdominal pain: Secondary | ICD-10-CM

## 2012-10-31 LAB — CBC WITH DIFFERENTIAL/PLATELET
BASO%: 1 % (ref 0.0–2.0)
EOS%: 0.7 % (ref 0.0–7.0)
HGB: 12.8 g/dL (ref 11.6–15.9)
MCH: 32.4 pg (ref 25.1–34.0)
MCHC: 33.2 g/dL (ref 31.5–36.0)
MONO#: 0.2 10*3/uL (ref 0.1–0.9)
RDW: 14.9 % — ABNORMAL HIGH (ref 11.2–14.5)
WBC: 2.9 10*3/uL — ABNORMAL LOW (ref 3.9–10.3)
lymph#: 1.1 10*3/uL (ref 0.9–3.3)

## 2012-10-31 LAB — COMPREHENSIVE METABOLIC PANEL (CC13)
ALT: 40 U/L (ref 0–55)
AST: 23 U/L (ref 5–34)
Albumin: 3.9 g/dL (ref 3.5–5.0)
Alkaline Phosphatase: 61 U/L (ref 40–150)
Chloride: 104 mEq/L (ref 98–107)
Potassium: 3.9 mEq/L (ref 3.5–5.1)
Sodium: 139 mEq/L (ref 136–145)
Total Protein: 7.2 g/dL (ref 6.4–8.3)

## 2012-10-31 MED ORDER — IOHEXOL 300 MG/ML  SOLN
10.0000 mL | Freq: Once | INTRAMUSCULAR | Status: AC | PRN
  Administered 2012-10-31: 10 mL via INTRAVENOUS

## 2012-10-31 MED ORDER — ONDANSETRON 16 MG/50ML IVPB (CHCC)
16.0000 mg | Freq: Once | INTRAVENOUS | Status: AC
  Administered 2012-10-31: 16 mg via INTRAVENOUS

## 2012-10-31 MED ORDER — PACLITAXEL CHEMO INJECTION 300 MG/50ML
64.0000 mg/m2 | Freq: Once | INTRAVENOUS | Status: AC
  Administered 2012-10-31: 108 mg via INTRAVENOUS
  Filled 2012-10-31: qty 18

## 2012-10-31 MED ORDER — DEXAMETHASONE SODIUM PHOSPHATE 10 MG/ML IJ SOLN
10.0000 mg | Freq: Once | INTRAMUSCULAR | Status: AC
  Administered 2012-10-31: 10 mg via INTRAVENOUS

## 2012-10-31 MED ORDER — SODIUM CHLORIDE 0.9 % IV SOLN
Freq: Once | INTRAVENOUS | Status: AC
  Administered 2012-10-31: 12:00:00 via INTRAVENOUS

## 2012-10-31 MED ORDER — DIPHENHYDRAMINE HCL 50 MG/ML IJ SOLN
25.0000 mg | Freq: Once | INTRAMUSCULAR | Status: AC
  Administered 2012-10-31: 25 mg via INTRAVENOUS

## 2012-10-31 MED ORDER — FAMOTIDINE IN NACL 20-0.9 MG/50ML-% IV SOLN
20.0000 mg | Freq: Once | INTRAVENOUS | Status: AC
  Administered 2012-10-31: 20 mg via INTRAVENOUS

## 2012-10-31 MED ORDER — HEPARIN SOD (PORK) LOCK FLUSH 100 UNIT/ML IV SOLN
500.0000 [IU] | Freq: Once | INTRAVENOUS | Status: AC
  Administered 2012-10-31: 500 [IU]

## 2012-10-31 MED ORDER — CARBOPLATIN CHEMO INJECTION 600 MG/60ML
594.6000 mg | Freq: Once | INTRAVENOUS | Status: AC
  Administered 2012-10-31: 590 mg via INTRAVENOUS
  Filled 2012-10-31: qty 59

## 2012-10-31 NOTE — Progress Notes (Signed)
RN was assisting nurse with patient's IV pump.  Rn assessed pt's PAC site and attempted to flush it to see if this was causing an occlusion.  Per Adella Hare RN, she was able to obtain labs.  This RN was unable to flush the port.  At the area of the clavicle RN could appreciate some mild puffiness and the skin felt spongy.  This area is right where the port catheter is fed.  RN did not attempt to flush further and asked Felicita Gage RN to come look at it.  Per Felicita Gage RN, she was in agreement that the area looked swollen.  Dr. Kalman Drape RN [name] RN aware.  Orders for a dye-study given.  Pt aware.  Peripheral IV started.

## 2012-10-31 NOTE — Progress Notes (Signed)
OK to treat per Dr. Truett Perna - WBC 2.9, ANC 1.5.  Port was accessed- flushed well - good blood return, obtained blood sample for labs and connected patient to pump.  Infusion of NS would not run.  Port would not draw back blood.  Flush attempted - seemed to create swelling near clavicle so  flush was stopped.  PIV placed and dye study order obtained.   Patient port was left accessed, capped and clamped prior to discharge from infusion room.

## 2012-10-31 NOTE — Patient Instructions (Addendum)
[facility] Discharge Instructions for Patients Receiving Chemotherapy  Today you received the following chemotherapy agents Taxol and Carbo  To help prevent nausea and vomiting after your treatment, we encourage you to take your nausea medication as prescribed. Begin taking it as directed and take it as often as prescribed for the next 48-72 hours.   If you develop nausea and vomiting that is not controlled by your nausea medication, call the clinic. If it is after clinic hours your family physician or the after hours number for the clinic or go to the Emergency Department.   BELOW ARE SYMPTOMS THAT SHOULD BE REPORTED IMMEDIATELY:  *FEVER GREATER THAN 100.5 F  *CHILLS WITH OR WITHOUT FEVER  NAUSEA AND VOMITING THAT IS NOT CONTROLLED WITH YOUR NAUSEA MEDICATION  *UNUSUAL SHORTNESS OF BREATH  *UNUSUAL BRUISING OR BLEEDING  TENDERNESS IN MOUTH AND THROAT WITH OR WITHOUT PRESENCE OF ULCERS  *URINARY PROBLEMS  *BOWEL PROBLEMS  UNUSUAL RASH Items with * indicate a potential emergency and should be followed up as soon as possible.  One of the nurses will contact you 24 hours after your treatment. Please let the nurse know about any problems that you may have experienced. Feel free to call the clinic you have any questions or concerns. The clinic phone number is [phone].   I have been informed and understand all the instructions given to me. I know to contact the clinic, my physician, or go to the Emergency Department if any problems should occur. I do not have any questions at this time, but understand that I may call the clinic during office hours   should I have any questions or need assistance in obtaining follow up care.    __________________________________________  _____________  __________ Signature of Patient or Authorized Representative            Date                   Time    __________________________________________ Nurse's Signature    

## 2012-10-31 NOTE — Procedures (Signed)
Appropriately positioned and functioning R IJ approach port-a-catheter.  Port is ready for immediate use.

## 2012-10-31 NOTE — Progress Notes (Signed)
   [facility]    OFFICE PROGRESS NOTE   INTERVAL HISTORY:   She returns as scheduled. She has completed 2 cycles of Taxol/carboplatin. No nausea or neuropathy symptoms. She continues to have mild abdominal discomfort. Her chief complaint is anxiety. The anxiety is not relieved with Xanax.  Objective:  Vital signs in last 24 hours:  Blood pressure 131/89, pulse 108, temperature 98.1 F (36.7 C), temperature source Oral, resp. rate 20, height 5' 5.5" (1.664 m), weight 139 lb 4.8 oz (63.186 kg).    HEENT: No thrush or ulcers Resp: Lungs clear bilaterally Cardio: Regular rate and rhythm GI: No hepatomegaly, nontender, no apparent ascites, no mass Vascular: No leg edema  Portacath/PICC-without erythema  Lab Results:  Lab Results  Component Value Date   WBC 2.9* 10/31/2012   HGB 12.8 10/31/2012   HCT 38.6 10/31/2012   MCV 97.7 10/31/2012   PLT 133* 10/31/2012   ANC 1.5   Medications: I have reviewed the patient's current medications.  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 A 5 mm nodules remain on the right diaphragm. -Cycle 1 of adjuvant Taxol/carboplatin chemotherapy initiated on 09/19/2012  2. Low abdomen/suprapubic pain prior to the exploratory laparotomy-likely secondary to omental/pelvic tumor  3. Chronic neck and back pain  4. Anxiety  5. Status post Port-A-Cath placement 09/22/2012  6. Neutropenia secondary to chemotherapy, day 15 cycle 1 Taxol/carboplatin was not given on 10/03/2012    Disposition:  She continues to tolerate the chemotherapy well. The plan is to proceed with cycle 3 today. She will return for an office visit in 3 weeks.  Ms. Huhta has significant anxiety. She met with the cancer Center social worker today. She will be referred for counseling. The Xanax dose  was increased to 0.5-1 mg every 8 hours as needed.   Thornton Papas, MD  10/31/2012  10:54 AM

## 2012-10-31 NOTE — Progress Notes (Signed)
Clinical Social Worker met with pt in exam room to assess for needs and concerns.  Pt expressed increased anxiety.  Pt stated she "sits at home and thinks of all the bad things that could happen."  CSW validated pt's feelings and assured pt that some fear and anxiety was understandable.  CSW and pt discussed appropriate coping skills and resources available.  Pt has attended GYN support group, and expressed interest in counseling and other [facility] support programs.  Pt was agreeable to CSW making a referral to the counseling interns.  CSW encouraged pt to call with any questions or concerns.    Tamala Julian, MSW, LCSW Clinical Social Worker [facility] [phone]

## 2012-11-04 ENCOUNTER — Other Ambulatory Visit: Payer: Self-pay | Admitting: Oncology

## 2012-11-06 ENCOUNTER — Telehealth: Payer: Self-pay | Admitting: *Deleted

## 2012-11-06 NOTE — Telephone Encounter (Signed)
Pt's daughter contacted on call MD to discuss pt's depression/ anxiety symptoms. Returned call to Drinda Butts, she is concerned that her mom is having bad dreams while taking Xanax 1 mg. Instructed her to decrease dose back to 0.5 mg. Informed her pt is being referred to psychiatrist for management of these symptoms. Asking if there is anything else she can take until then. Reviewed with Dr. Truett Perna: Await psych appointment. Follow up with counseling interns.

## 2012-11-06 NOTE — Telephone Encounter (Signed)
Call from pt requesting prescription for depression/ anxiety. Stated the Xanax is not helping, when she takes higher dose it causes side effects such as bad dreams. She was told by another cancer pt that she may need to see a psychiatrist. Asking how she can get in with a psychiatrist for medical management of her symptoms.

## 2012-11-06 NOTE — Telephone Encounter (Signed)
Earlier note reviewed with Dr. Truett Perna. Order received for psych referral. Referral sent. Pt is requesting they check with her insurance first to be sure it is covered. She understands to expect a call from Devine, CSW.

## 2012-11-07 ENCOUNTER — Other Ambulatory Visit: Payer: Self-pay | Admitting: *Deleted

## 2012-11-07 ENCOUNTER — Ambulatory Visit (HOSPITAL_BASED_OUTPATIENT_CLINIC_OR_DEPARTMENT_OTHER): Payer: Medicare Other

## 2012-11-07 ENCOUNTER — Other Ambulatory Visit (HOSPITAL_BASED_OUTPATIENT_CLINIC_OR_DEPARTMENT_OTHER): Payer: Medicare Other | Admitting: Lab

## 2012-11-07 ENCOUNTER — Other Ambulatory Visit: Payer: Self-pay | Admitting: Oncology

## 2012-11-07 VITALS — BP 120/78 | HR 89 | Temp 98.4°F | Resp 20

## 2012-11-07 DIAGNOSIS — C569 Malignant neoplasm of unspecified ovary: Secondary | ICD-10-CM

## 2012-11-07 DIAGNOSIS — Z5111 Encounter for antineoplastic chemotherapy: Secondary | ICD-10-CM

## 2012-11-07 LAB — CBC WITH DIFFERENTIAL/PLATELET
Basophils Absolute: 0 10*3/uL (ref 0.0–0.1)
HCT: 36.4 % (ref 34.8–46.6)
HGB: 12.1 g/dL (ref 11.6–15.9)
MONO#: 0.2 10*3/uL (ref 0.1–0.9)
NEUT%: 51.9 % (ref 38.4–76.8)
Platelets: 196 10*3/uL (ref 145–400)
WBC: 2.4 10*3/uL — ABNORMAL LOW (ref 3.9–10.3)
lymph#: 0.9 10*3/uL (ref 0.9–3.3)

## 2012-11-07 MED ORDER — DIPHENHYDRAMINE HCL 50 MG/ML IJ SOLN
25.0000 mg | Freq: Once | INTRAMUSCULAR | Status: AC
  Administered 2012-11-07: 25 mg via INTRAVENOUS

## 2012-11-07 MED ORDER — ALPRAZOLAM 0.5 MG PO TABS: 0.5000 mg | ORAL_TABLET | Freq: Three times a day (TID) | ORAL | Status: DC | PRN

## 2012-11-07 MED ORDER — PACLITAXEL CHEMO INJECTION 300 MG/50ML
64.0000 mg/m2 | Freq: Once | INTRAVENOUS | Status: AC
  Administered 2012-11-07: 108 mg via INTRAVENOUS
  Filled 2012-11-07: qty 18

## 2012-11-07 MED ORDER — SODIUM CHLORIDE 0.9 % IV SOLN
Freq: Once | INTRAVENOUS | Status: AC
  Administered 2012-11-07: 10:00:00 via INTRAVENOUS

## 2012-11-07 MED ORDER — DEXAMETHASONE SODIUM PHOSPHATE 10 MG/ML IJ SOLN
10.0000 mg | Freq: Once | INTRAMUSCULAR | Status: AC
  Administered 2012-11-07: 10 mg via INTRAVENOUS

## 2012-11-07 MED ORDER — FAMOTIDINE IN NACL 20-0.9 MG/50ML-% IV SOLN
20.0000 mg | Freq: Once | INTRAVENOUS | Status: AC
  Administered 2012-11-07: 20 mg via INTRAVENOUS

## 2012-11-07 MED ORDER — ONDANSETRON 8 MG/50ML IVPB (CHCC)
8.0000 mg | Freq: Once | INTRAVENOUS | Status: AC
  Administered 2012-11-07: 8 mg via INTRAVENOUS

## 2012-11-07 MED ORDER — HEPARIN SOD (PORK) LOCK FLUSH 100 UNIT/ML IV SOLN
500.0000 [IU] | Freq: Once | INTRAVENOUS | Status: AC | PRN
  Administered 2012-11-07: 500 [IU]
  Filled 2012-11-07: qty 5

## 2012-11-07 MED ORDER — SODIUM CHLORIDE 0.9 % IJ SOLN
10.0000 mL | INTRAMUSCULAR | Status: DC | PRN
  Administered 2012-11-07: 10 mL
  Filled 2012-11-07: qty 10

## 2012-11-07 NOTE — Patient Instructions (Signed)
[facility] Center Discharge Instructions for Patients Receiving Chemotherapy  Today you received the following chemotherapy agents :  Taxol.  To help prevent nausea and vomiting after your treatment, we encourage you to take your nausea medication as instructed by your physician.    If you develop nausea and vomiting that is not controlled by your nausea medication, call the clinic. If it is after clinic hours your family physician or the after hours number for the clinic or go to the Emergency Department.   BELOW ARE SYMPTOMS THAT SHOULD BE REPORTED IMMEDIATELY:  *FEVER GREATER THAN 100.5 F  *CHILLS WITH OR WITHOUT FEVER  NAUSEA AND VOMITING THAT IS NOT CONTROLLED WITH YOUR NAUSEA MEDICATION  *UNUSUAL SHORTNESS OF BREATH  *UNUSUAL BRUISING OR BLEEDING  TENDERNESS IN MOUTH AND THROAT WITH OR WITHOUT PRESENCE OF ULCERS  *URINARY PROBLEMS  *BOWEL PROBLEMS  UNUSUAL RASH Items with * indicate a potential emergency and should be followed up as soon as possible.  One of the nurses will contact you 24 hours after your treatment. Please let the nurse know about any problems that you may have experienced. Feel free to call the clinic you have any questions or concerns. The clinic phone number is [phone].   I have been informed and understand all the instructions given to me. I know to contact the clinic, my physician, or go to the Emergency Department if any problems should occur. I do not have any questions at this time, but understand that I may call the clinic during office hours   should I have any questions or need assistance in obtaining follow up care.    __________________________________________  _____________  __________ Signature of Patient or Authorized Representative            Date                   Time    __________________________________________ Nurse's Signature    

## 2012-11-08 ENCOUNTER — Other Ambulatory Visit: Payer: Self-pay | Admitting: *Deleted

## 2012-11-08 DIAGNOSIS — C569 Malignant neoplasm of unspecified ovary: Secondary | ICD-10-CM

## 2012-11-10 ENCOUNTER — Telehealth: Payer: Self-pay | Admitting: Oncology

## 2012-11-10 ENCOUNTER — Telehealth: Payer: Self-pay | Admitting: *Deleted

## 2012-11-10 MED ORDER — ESCITALOPRAM OXALATE 5 MG PO TABS: 5.0000 mg | ORAL_TABLET | Freq: Every day | ORAL | Status: DC

## 2012-11-10 NOTE — Telephone Encounter (Signed)
Call from pt reporting she is scheduled to see psychiatrist in March. Concerned about waiting this long for appt. Pt reports she has days where she just doesn't feel like doing anuthing. Received call from sister as well with the same concerns. Reviewed with Dr. Truett Perna. Order received for antidepressant. Called pt, instructed her to continue follow up with counselors in the interim. OK to continue Xanax as needed while taking Lexapro, per Dr. Truett Perna. She voiced understanding.

## 2012-11-10 NOTE — Telephone Encounter (Signed)
Patient's family member called regarding the patient. The family member stated that "[name] [name] is experiencing serve depression and is seeking help, but can't find any. " I have transferred the member to the desk RN. JMW

## 2012-11-10 NOTE — Telephone Encounter (Signed)
Called [facility] RN left message regarding pschy appt. Pt has to call them to make appt

## 2012-11-14 ENCOUNTER — Ambulatory Visit: Payer: Medicare Other

## 2012-11-14 ENCOUNTER — Telehealth: Payer: Self-pay | Admitting: *Deleted

## 2012-11-14 ENCOUNTER — Ambulatory Visit (HOSPITAL_BASED_OUTPATIENT_CLINIC_OR_DEPARTMENT_OTHER): Payer: Medicare Other | Admitting: Nutrition

## 2012-11-14 ENCOUNTER — Other Ambulatory Visit (HOSPITAL_BASED_OUTPATIENT_CLINIC_OR_DEPARTMENT_OTHER): Payer: Medicare Other | Admitting: Lab

## 2012-11-14 DIAGNOSIS — C569 Malignant neoplasm of unspecified ovary: Secondary | ICD-10-CM

## 2012-11-14 DIAGNOSIS — C20 Malignant neoplasm of rectum: Secondary | ICD-10-CM

## 2012-11-14 LAB — CBC WITH DIFFERENTIAL/PLATELET
Basophils Absolute: 0 10*3/uL (ref 0.0–0.1)
Eosinophils Absolute: 0 10*3/uL (ref 0.0–0.5)
HCT: 35 % (ref 34.8–46.6)
HGB: 11.7 g/dL (ref 11.6–15.9)
LYMPH%: 46.9 % (ref 14.0–49.7)
MONO#: 0.6 10*3/uL (ref 0.1–0.9)
NEUT#: 0.7 10*3/uL — ABNORMAL LOW (ref 1.5–6.5)
NEUT%: 28.8 % — ABNORMAL LOW (ref 38.4–76.8)
Platelets: 192 10*3/uL (ref 145–400)
WBC: 2.6 10*3/uL — ABNORMAL LOW (ref 3.9–10.3)
nRBC: 1 % — ABNORMAL HIGH (ref 0–0)

## 2012-11-14 NOTE — Telephone Encounter (Signed)
Spoke with patient about her anxiety and depression/worry. Afraid to take Xanax tid due to addiction potential. Made her aware OK to take it tid right now since she is under a lot of pressure. Antidepressant has not been in her system long enough yet to know if that dose is effective. Stressed importance of continuing to get counseling sessions and mentally try to push worrisome thoughts from her mind. Reminded her worry is counterproductive and wastes her energy and steals her joy. Suggested distraction and prayer. Discussed role of steroids in chemo premeds, and she agrees to continue this. Explained how chemo steroids and chemo can cause her face to feel warm/hot flashes as well as antidepressant. Reminded her that treatment delay due to low counts is very common.

## 2012-11-14 NOTE — Progress Notes (Signed)
I spoke with patient briefly. She is unable to get her chemotherapy today secondary to low counts. Weight has declined slightly to 139.3 pounds. She verbalizes feeling depressed. She does not want to cook. She continues to drink oral nutrition supplements.  I educated patient on strategies for picking up easy to consume, higher protein foods at the grocery store. I've encouraged patient to continue oral nutrition supplements. I have refered her to social work.  Patient has my contact information for further questions or concerns regarding nutrition. There is no followup scheduled at this time.

## 2012-11-14 NOTE — Telephone Encounter (Signed)
Was informed by infusion room nurse that patient wants RN to call her today. Attempted to reach patient. No answer. Left VM to call back.

## 2012-11-14 NOTE — Progress Notes (Signed)
Labs reviewed by Dr. Truett Perna. ANC 0.7 Treatment cancelled. Notified pt to call with fever of 100.5 or greater. Verified with pt next appt lab/md/chemo on 11/21/12. Pt verbalized understanding. Requested I give Desk RN message- Pt would like to know if she is on too high of a dose of steriods as her face is hot and her legs have been hurting.  Message given to Desk RN, who will review with MD and call pt with further information.

## 2012-11-19 ENCOUNTER — Other Ambulatory Visit: Payer: Self-pay | Admitting: Oncology

## 2012-11-21 ENCOUNTER — Other Ambulatory Visit: Payer: Self-pay | Admitting: Oncology

## 2012-11-21 ENCOUNTER — Other Ambulatory Visit: Payer: Self-pay | Admitting: *Deleted

## 2012-11-21 ENCOUNTER — Ambulatory Visit (HOSPITAL_BASED_OUTPATIENT_CLINIC_OR_DEPARTMENT_OTHER): Payer: Medicare Other | Admitting: Oncology

## 2012-11-21 ENCOUNTER — Telehealth: Payer: Self-pay | Admitting: Oncology

## 2012-11-21 ENCOUNTER — Other Ambulatory Visit (HOSPITAL_BASED_OUTPATIENT_CLINIC_OR_DEPARTMENT_OTHER): Payer: Medicare Other | Admitting: Lab

## 2012-11-21 ENCOUNTER — Ambulatory Visit (HOSPITAL_BASED_OUTPATIENT_CLINIC_OR_DEPARTMENT_OTHER): Payer: Medicare Other

## 2012-11-21 VITALS — BP 127/77 | HR 99 | Temp 97.3°F | Resp 20 | Ht 65.5 in | Wt 140.2 lb

## 2012-11-21 DIAGNOSIS — C569 Malignant neoplasm of unspecified ovary: Secondary | ICD-10-CM

## 2012-11-21 DIAGNOSIS — G4700 Insomnia, unspecified: Secondary | ICD-10-CM

## 2012-11-21 DIAGNOSIS — D702 Other drug-induced agranulocytosis: Secondary | ICD-10-CM

## 2012-11-21 DIAGNOSIS — Z5111 Encounter for antineoplastic chemotherapy: Secondary | ICD-10-CM

## 2012-11-21 DIAGNOSIS — F411 Generalized anxiety disorder: Secondary | ICD-10-CM

## 2012-11-21 LAB — CBC WITH DIFFERENTIAL/PLATELET
Basophils Absolute: 0 10*3/uL (ref 0.0–0.1)
EOS%: 0.3 % (ref 0.0–7.0)
HCT: 36.3 % (ref 34.8–46.6)
HGB: 12.1 g/dL (ref 11.6–15.9)
MCH: 33.2 pg (ref 25.1–34.0)
NEUT%: 53.9 % (ref 38.4–76.8)
lymph#: 0.9 10*3/uL (ref 0.9–3.3)

## 2012-11-21 LAB — COMPREHENSIVE METABOLIC PANEL (CC13)
AST: 25 U/L (ref 5–34)
BUN: 18.4 mg/dL (ref 7.0–26.0)
CO2: 27 mEq/L (ref 22–29)
Calcium: 9.6 mg/dL (ref 8.4–10.4)
Chloride: 102 mEq/L (ref 98–107)
Creatinine: 0.7 mg/dL (ref 0.6–1.1)

## 2012-11-21 LAB — CA 125: CA 125: 4.3 U/mL (ref 0.0–30.2)

## 2012-11-21 MED ORDER — PACLITAXEL CHEMO INJECTION 300 MG/50ML
64.0000 mg/m2 | Freq: Once | INTRAVENOUS | Status: AC
  Administered 2012-11-21: 108 mg via INTRAVENOUS
  Filled 2012-11-21: qty 18

## 2012-11-21 MED ORDER — FAMOTIDINE IN NACL 20-0.9 MG/50ML-% IV SOLN
20.0000 mg | Freq: Once | INTRAVENOUS | Status: AC
  Administered 2012-11-21: 20 mg via INTRAVENOUS

## 2012-11-21 MED ORDER — ESCITALOPRAM OXALATE 10 MG PO TABS: 10.0000 mg | ORAL_TABLET | Freq: Every day | ORAL | Status: DC

## 2012-11-21 MED ORDER — DIAZEPAM 5 MG PO TABS: 5.0000 mg | ORAL_TABLET | Freq: Three times a day (TID) | ORAL | Status: DC | PRN

## 2012-11-21 MED ORDER — DIPHENHYDRAMINE HCL 50 MG/ML IJ SOLN
25.0000 mg | Freq: Once | INTRAMUSCULAR | Status: AC
  Administered 2012-11-21: 25 mg via INTRAVENOUS

## 2012-11-21 MED ORDER — SODIUM CHLORIDE 0.9 % IV SOLN
Freq: Once | INTRAVENOUS | Status: AC
  Administered 2012-11-21: 13:00:00 via INTRAVENOUS

## 2012-11-21 MED ORDER — SODIUM CHLORIDE 0.9 % IJ SOLN
10.0000 mL | INTRAMUSCULAR | Status: DC | PRN
  Filled 2012-11-21: qty 10

## 2012-11-21 MED ORDER — SODIUM CHLORIDE 0.9 % IV SOLN
590.0000 mg | Freq: Once | INTRAVENOUS | Status: AC
  Administered 2012-11-21: 590 mg via INTRAVENOUS
  Filled 2012-11-21: qty 59

## 2012-11-21 MED ORDER — ONDANSETRON 16 MG/50ML IVPB (CHCC)
16.0000 mg | Freq: Once | INTRAVENOUS | Status: AC
  Administered 2012-11-21: 16 mg via INTRAVENOUS

## 2012-11-21 MED ORDER — DEXAMETHASONE SODIUM PHOSPHATE 10 MG/ML IJ SOLN
5.0000 mg | Freq: Once | INTRAMUSCULAR | Status: AC
  Administered 2012-11-21: 5 mg via INTRAVENOUS

## 2012-11-21 MED ORDER — HEPARIN SOD (PORK) LOCK FLUSH 100 UNIT/ML IV SOLN
500.0000 [IU] | Freq: Once | INTRAVENOUS | Status: DC | PRN
  Filled 2012-11-21: qty 5

## 2012-11-21 NOTE — Patient Instructions (Addendum)
 Cancer Center Discharge Instructions for Patients Receiving Chemotherapy  Today you received the following chemotherapy agents Taxol and Carbo  To help prevent nausea and vomiting after your treatment, we encourage you to take your nausea medication as prescribed. Begin taking it as directed and take it as often as prescribed for the next 48-72 hours.   If you develop nausea and vomiting that is not controlled by your nausea medication, call the clinic. If it is after clinic hours your family physician or the after hours number for the clinic or go to the Emergency Department.   BELOW ARE SYMPTOMS THAT SHOULD BE REPORTED IMMEDIATELY:  *FEVER GREATER THAN 100.5 F  *CHILLS WITH OR WITHOUT FEVER  NAUSEA AND VOMITING THAT IS NOT CONTROLLED WITH YOUR NAUSEA MEDICATION  *UNUSUAL SHORTNESS OF BREATH  *UNUSUAL BRUISING OR BLEEDING  TENDERNESS IN MOUTH AND THROAT WITH OR WITHOUT PRESENCE OF ULCERS  *URINARY PROBLEMS  *BOWEL PROBLEMS  UNUSUAL RASH Items with * indicate a potential emergency and should be followed up as soon as possible.  One of the nurses will contact you 24 hours after your treatment. Please let the nurse know about any problems that you may have experienced. Feel free to call the clinic you have any questions or concerns. The clinic phone number is [phone].   I have been informed and understand all the instructions given to me. I know to contact the clinic, my physician, or go to the Emergency Department if any problems should occur. I do not have any questions at this time, but understand that I may call the clinic during office hours   should I have any questions or need assistance in obtaining follow up care.    __________________________________________  _____________  __________ Signature of Patient or Authorized Representative            Date                   Time    __________________________________________ Nurse's Signature

## 2012-11-21 NOTE — Addendum Note (Signed)
Addended by: Wandalee Ferdinand on: 11/21/2012 12:31 PM   Modules accepted: Orders

## 2012-11-21 NOTE — Patient Instructions (Signed)
Discontinue the Xanax and begin Valium 5-10 mg every 8 hours as needed for sleep or anxiety (script has been called in) Increase your Lexapro to 10 mg daily (new script has been called in) Call office if the Valium is not helpful

## 2012-11-21 NOTE — Progress Notes (Signed)
   [name] [name]    OFFICE PROGRESS NOTE   INTERVAL HISTORY:   She returns as scheduled. She completed a third cycle of Taxol/carboplatin beginning on 10/31/2012. The day 15 Taxol was held secondary to neutropenia.  She denies nausea. Occasional numbness at the upper leg and right hand. No significant numbness in the fingers or toes. She continues to have discomfort in the right lower abdomen. She is not taking pain medication.  [name] [name] complains of severe anxiety. Xanax has not helped. She also feels depressed. She is scheduled for a psychiatry appointment in approximately 2 weeks. She has not slept for the past 3 days.  Objective:  Vital signs in last 24 hours:  Blood pressure 127/77, pulse 99, temperature 97.3 F (36.3 C), temperature source Oral, resp. rate 20, height 5' 5.5" (1.664 m), weight 140 lb 3.2 oz (63.594 kg).    HEENT: No thrush Resp: Lungs clear bilaterally Cardio: Regular rate and rhythm GI: No hepatomegaly, no mass, no apparent ascites, mild tenderness in the right low abdomen Vascular: No leg edema Neuro: Alert and oriented. The motor exam appears nonfocal.    Portacath/PICC-without erythema  Lab Results:  Lab Results  Component Value Date   WBC 3.9 11/21/2012   HGB 12.1 11/21/2012   HCT 36.3 11/21/2012   MCV 99.4 11/21/2012   PLT 143* 11/21/2012   ANC 2.1 CA 125 on 11/07/2012-5.4  Medications: I have reviewed the patient's current medications.  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 A 5 mm nodules remain on the right diaphragm. -Cycle 1 of adjuvant Taxol/carboplatin chemotherapy initiated on 09/19/2012 , the CA 125 has normalized. 2. Low abdomen/suprapubic pain prior to the exploratory laparotomy-likely secondary to omental/pelvic tumor , persistent mild pain in the  right low abdomen-likely postoperative pain 3. Chronic neck and back pain  4. Anxiety -persistent despite Lexapro and Xanax 5. Status post Port-A-Cath placement 09/22/2012  6. Neutropenia secondary to chemotherapy- day 15 cycle 1 and cycle 3 Taxol/carboplatin not given secondary to neutropenia  Disposition:  She appears to be tolerating the chemotherapy well. The plan is to proceed with cycle 4 Taxol/carboplatin today. She will return for an office visit on 12/12/2012.  [name] appeared very anxious during the visit today. She asked questions repeatedly and appeared to have pressured speech. She has been evaluated by the cancer [name] social worker and a psychiatry referral has been made. We discontinued the Xanax today. She will try Valium for anxiety and insomnia. We increased the Lexapro dose to 10 mg daily. I will decrease the Decadron dose as part of the Taxol premedication. Decadron could be contribute to the insomnia and anxiety. We will consider referring her for an inpatient psychiatry evaluation.   Thornton Papas, MD  11/21/2012  11:59 AM

## 2012-11-22 ENCOUNTER — Telehealth: Payer: Self-pay | Admitting: *Deleted

## 2012-11-22 NOTE — Telephone Encounter (Signed)
@  1010: spoke with patient about her night. She took 7.5 mg Valium at 9:30 pm and reports not sleeping at all. Was awake all night. Reminded her that she had decadron yesterday, so last night was not a true test of effectiveness of the Valium and reminded her she was supposed to take 10 mg at bedtime. Asking what can she take now? She has not yet taken her Lexapro today because she was not sure she could? Re-inforced that she is to take Lexapro 10 mg every morning. Told her she can take a valium for anxiety (5-10 mg) 3-4 hours after the Lexapro if she needs it. Told her be sure she is not trying to watch TV when going to bed, that she has dark room and suggested trying hot cup of decaff tea at bedtime to relax. Stressed how bedtime routine can help aid sleep. She will call office tomorrow if she does not sleep better.

## 2012-11-22 NOTE — Telephone Encounter (Signed)
@  0953 VM from patient "I'm not sleeping at night. Please call me as soon as possible so I'll know what to do".

## 2012-11-22 NOTE — Telephone Encounter (Signed)
@  0819-VM saying "I was wide awake all night. Please call me".

## 2012-11-23 ENCOUNTER — Emergency Department (HOSPITAL_COMMUNITY)
Admission: EM | Admit: 2012-11-23 | Discharge: 2012-11-23 | Disposition: A | Discharge status: Home / Self Care | Payer: Medicare Other | Attending: Emergency Medicine | Admitting: Emergency Medicine

## 2012-11-23 ENCOUNTER — Encounter (HOSPITAL_COMMUNITY): Payer: Self-pay | Admitting: Emergency Medicine

## 2012-11-23 ENCOUNTER — Telehealth: Payer: Self-pay | Admitting: *Deleted

## 2012-11-23 ENCOUNTER — Encounter: Payer: Self-pay | Admitting: Specialist

## 2012-11-23 DIAGNOSIS — Z79899 Other long term (current) drug therapy: Secondary | ICD-10-CM | POA: Insufficient documentation

## 2012-11-23 DIAGNOSIS — Z8639 Personal history of other endocrine, nutritional and metabolic disease: Secondary | ICD-10-CM | POA: Insufficient documentation

## 2012-11-23 DIAGNOSIS — C569 Malignant neoplasm of unspecified ovary: Secondary | ICD-10-CM | POA: Insufficient documentation

## 2012-11-23 DIAGNOSIS — F3289 Other specified depressive episodes: Secondary | ICD-10-CM | POA: Insufficient documentation

## 2012-11-23 DIAGNOSIS — F329 Major depressive disorder, single episode, unspecified: Secondary | ICD-10-CM | POA: Insufficient documentation

## 2012-11-23 DIAGNOSIS — Z9851 Tubal ligation status: Secondary | ICD-10-CM | POA: Insufficient documentation

## 2012-11-23 DIAGNOSIS — Z90710 Acquired absence of both cervix and uterus: Secondary | ICD-10-CM | POA: Insufficient documentation

## 2012-11-23 DIAGNOSIS — F32A Depression, unspecified: Secondary | ICD-10-CM

## 2012-11-23 DIAGNOSIS — Z8742 Personal history of other diseases of the female genital tract: Secondary | ICD-10-CM | POA: Insufficient documentation

## 2012-11-23 DIAGNOSIS — F419 Anxiety disorder, unspecified: Secondary | ICD-10-CM

## 2012-11-23 DIAGNOSIS — F411 Generalized anxiety disorder: Secondary | ICD-10-CM | POA: Insufficient documentation

## 2012-11-23 DIAGNOSIS — Z862 Personal history of diseases of the blood and blood-forming organs and certain disorders involving the immune mechanism: Secondary | ICD-10-CM | POA: Insufficient documentation

## 2012-11-23 DIAGNOSIS — M069 Rheumatoid arthritis, unspecified: Secondary | ICD-10-CM | POA: Insufficient documentation

## 2012-11-23 DIAGNOSIS — Z9079 Acquired absence of other genital organ(s): Secondary | ICD-10-CM | POA: Insufficient documentation

## 2012-11-23 MED ORDER — ZOLPIDEM TARTRATE 5 MG PO TABS: 5.0000 mg | ORAL_TABLET | Freq: Every evening | ORAL | Status: DC | PRN

## 2012-11-23 NOTE — ED Notes (Addendum)
Pt states that she was recently diagnosed with ovarian cancer and also recently started cancer treatment. Pt reports feeling depressed, overwhelmed, and has anxiety. Pt reports recently being given a prescription for diazepam, however she is worried about taking it during the day causing her to be drowsy and to not be able to sleep at night. Pt reports having episodes where she worries so much that she becomes angry and hits a wall with a open hand, which has occurred several times over the past few weeks.

## 2012-11-23 NOTE — Telephone Encounter (Signed)
Patient left VM X 2 on nurse line requesting to be call as soon as possible to discuss her medications. Plan to return call after clinic patients are seen today.

## 2012-11-23 NOTE — Progress Notes (Signed)
I am entering this note on behalf of [name], doctoral counseling intern at the [employer].  Ms. Rufina Falco at this time is unable to document on patients who are outpatient.  Ms. Rufina Falco writes:"[name]  [name] (patient) called at 11am for consult about her psych medications and indicated that she was anxious and very distressed.  Patient indicated that she was worried about her medication but wanted to take more to sleep.  I informed her that it is beyond my scope of practice of advise her on medication use and encouraged her to go to the Emergency Department for a psychiatric evaluation and to seek psychiatric services as soon as possible.  I also encouraged her to call 911 if she deemed necessary.  I did ask the patient if she felt she was a danger to herself or others, and she responded that she was not."

## 2012-11-23 NOTE — ED Provider Notes (Addendum)
History     CSN: [id number]  Arrival date & time 11/23/12  1330   First MD Initiated Contact with Patient 11/23/12 1344      Chief Complaint  Patient presents with  . Anxiety  . Depression    (Consider location/radiation/quality/duration/timing/severity/associated sxs/prior treatment) The history is provided by the patient.   the patient reports developing worsening anxiety and sleep over the past several months since her new diagnosis of stage III ovarian cancer.  Denies homicidal or suicidal thoughts.  She states that she is having difficulty controlling her mood and her anxiety.  She presents the ER asking for help with medications that may better manage her symptoms.  She has not met with a psychiatrist yet but has an appointment scheduled for 2 weeks from today.  She denies hallucinations.  Her symptoms are mild to moderate in severity.  Nothing worsens or improves her symptoms.  She is in the ER with her sister today.  Past Medical History  Diagnosis Date  . Rheumatoid arthritis   . Endometrial polyp   . Cystocele with uterine descensus     AND RECTOCELE  . Elevated cholesterol   . Cancer     Ovarian    Past Surgical History  Procedure Laterality Date  . Hand surgery  1996  . Tubal ligation  1980  . Hysteroscopy  2004    D&C  . Dilation and curettage of uterus  2004    WITH HYSTEROSCOPY  . Neck surgery  2009    SPURS  . Cyst on neck  2011  . Abdominal hysterectomy  08/22/2012    Procedure: HYSTERECTOMY ABDOMINAL;  Surgeon: Jeannette Corpus, MD;  Location: [facility];  Service: Gynecology;  Laterality: N/A;  . Salpingoophorectomy  08/22/2012    Procedure: SALPINGO OOPHORECTOMY;  Surgeon: Jeannette Corpus, MD;  Location: [facility];  Service: Gynecology;  Laterality: Bilateral;  . Omentectomy  08/22/2012    Procedure: OMENTECTOMY;  Surgeon: Jeannette Corpus, MD;  Location: [facility];  Service: Gynecology;  Laterality: N/A;  . Debulking  08/22/2012   Procedure: DEBULKING;  Surgeon: Jeannette Corpus, MD;  Location: [facility];  Service: Gynecology;  Laterality: N/A;  Radical tumor debulking, Bilateral Ureterolysis  . Colostomy revision  08/22/2012    Procedure: COLON RESECTION SIGMOID;  Surgeon: Jeannette Corpus, MD;  Location: [facility];  Service: Gynecology;;  Rectal Sigmoid resection and low rectal anastomosis    Family History  Problem Relation Age of Onset  . Heart disease Mother   . Osteoporosis Sister     History  Substance Use Topics  . Smoking status: Never Smoker   . Smokeless tobacco: Not on file  . Alcohol Use: No    OB History   Grav Para Term Preterm Abortions TAB SAB Ect Mult Living   2 2 2       2       Review of Systems  All other systems reviewed and are negative.    Allergies  Atorvastatin; Decongest-aid; and Macrodantin  Home Medications   Current Outpatient Rx  Name  Route  Sig  Dispense  Refill  . calcium-vitamin D (OSCAL WITH D) 500-200 MG-UNIT per tablet   Oral   Take 1 tablet by mouth daily.           . diazepam (VALIUM) 5 MG tablet   Oral   Take 1-2 tablets (5-10 mg total) by mouth every 8 (eight) hours as needed for anxiety or sleep.   60  tablet   0   . escitalopram (LEXAPRO) 10 MG tablet   Oral   Take 1 tablet (10 mg total) by mouth daily.   30 tablet   2   . fish oil-omega-3 fatty acids 1000 MG capsule   Oral   Take 1 g by mouth daily.           . Flaxseed, Linseed, (FLAX SEED OIL) 1000 MG CAPS   Oral   Take 1 capsule by mouth daily.         [name] lidocaine-prilocaine (EMLA) cream   Topical   Apply topically as needed. Apply to [facility] site 2 hours prior to stick and cover with plastic wrap to numb site   30 g   prn   . Multiple Vitamin (MULTIVITAMIN WITH MINERALS) TABS   Oral   Take 1 tablet by mouth daily.         [name] oxyCODONE-acetaminophen (PERCOCET/ROXICET) 5-325 MG per tablet   Oral   Take 1-2 tablets by mouth every 6 (six) hours as needed (moderate to  severe pain (when tolerating fluids)).   30 tablet   0   . prochlorperazine (COMPAZINE) 10 MG tablet   Oral   Take 1 tablet (10 mg total) by mouth every 6 (six) hours as needed.   60 tablet   2   . zolpidem (AMBIEN) 5 MG tablet   Oral   Take 1 tablet (5 mg total) by mouth at bedtime as needed for sleep.   20 tablet   0     BP 113/78  Pulse 102  Temp(Src) 98.4 F (36.9 C) (Oral)  Resp 16  SpO2 96%  Physical Exam  Nursing note and vitals reviewed. Constitutional: She is oriented to person, place, and time. She appears well-developed and well-nourished.  HENT:  Head: Normocephalic.  Eyes: EOM are normal.  Neck: Normal range of motion.  Pulmonary/Chest: Effort normal.  Abdominal: She exhibits no distension.  Musculoskeletal: Normal range of motion.  Neurological: She is alert and oriented to person, place, and time.  Skin: No rash noted.  Psychiatric: She has a normal mood and affect. Her speech is normal and behavior is normal. Judgment and thought content normal. Cognition and memory are normal. She expresses no homicidal and no suicidal ideation.    ED Course  Procedures (including critical care time)  Labs Reviewed - No data to display No results found.   1. Depression   2. Anxiety       MDM  No homicidal or suicidal thoughts.  The patient is depressed.  She needs to followup with a psychiatrist.  She also needs to continue to manage her symptoms with her counselor.  She continues on Celexa but is going on for 2 days.  This will take time to work.  Ambien to help her sleep.  Valium every 6 hours when necessary anxiety.        Lyanne Co, MD 11/23/12 1510  Lyanne Co, MD 12/14/12 [phone]

## 2012-11-23 NOTE — ED Notes (Signed)
Discussed possible need for laboratory work and patient states she would prefer peripheral venous access rather than using her port.

## 2012-11-23 NOTE — Telephone Encounter (Signed)
@  1340 Noted patient did go to emergency room for psych consult. Will not call back.

## 2012-11-27 ENCOUNTER — Telehealth: Payer: Self-pay | Admitting: Oncology

## 2012-11-27 ENCOUNTER — Encounter: Payer: Self-pay | Admitting: Genetic Counselor

## 2012-11-27 ENCOUNTER — Ambulatory Visit (HOSPITAL_BASED_OUTPATIENT_CLINIC_OR_DEPARTMENT_OTHER): Payer: Medicare Other | Admitting: Genetic Counselor

## 2012-11-27 ENCOUNTER — Other Ambulatory Visit: Payer: Medicare Other

## 2012-11-27 DIAGNOSIS — Z8543 Personal history of malignant neoplasm of ovary: Secondary | ICD-10-CM

## 2012-11-27 DIAGNOSIS — IMO0002 Reserved for concepts with insufficient information to code with codable children: Secondary | ICD-10-CM

## 2012-11-27 DIAGNOSIS — Z8052 Family history of malignant neoplasm of bladder: Secondary | ICD-10-CM

## 2012-11-27 DIAGNOSIS — C801 Malignant (primary) neoplasm, unspecified: Secondary | ICD-10-CM

## 2012-11-27 NOTE — Progress Notes (Signed)
Dr.  Thornton Papas requested a consultation for genetic counseling and risk assessment for [name] [name], a 62 y.o. female, for discussion of her personal history of ovarian cancer and family history of bladder cancer and lymphoma. She presents to clinic today to discuss the possibility of a genetic predisposition to cancer, and to further clarify her risks, as well as her family members' risks for cancer.   HISTORY OF PRESENT ILLNESS: In 2013, at the age of 78, [name] [name] was diagnosed with ovarian cancer. This was treated with a hysterectomy and she is currently on chemotherapy.    Past Medical History  Diagnosis Date  . Rheumatoid arthritis   . Endometrial polyp   . Cystocele with uterine descensus     AND RECTOCELE  . Elevated cholesterol   . Cancer     Ovarian    Past Surgical History  Procedure Laterality Date  . Hand surgery  1996  . Tubal ligation  1980  . Hysteroscopy  2004    D&C  . Dilation and curettage of uterus  2004    WITH HYSTEROSCOPY  . Neck surgery  2009    SPURS  . Cyst on neck  2011  . Abdominal hysterectomy  08/22/2012    Procedure: HYSTERECTOMY ABDOMINAL;  Surgeon: Jeannette Corpus, MD;  Location: [facility];  Service: Gynecology;  Laterality: N/A;  . Salpingoophorectomy  08/22/2012    Procedure: SALPINGO OOPHORECTOMY;  Surgeon: Jeannette Corpus, MD;  Location: [facility];  Service: Gynecology;  Laterality: Bilateral;  . Omentectomy  08/22/2012    Procedure: OMENTECTOMY;  Surgeon: Jeannette Corpus, MD;  Location: [facility];  Service: Gynecology;  Laterality: N/A;  . Debulking  08/22/2012    Procedure: DEBULKING;  Surgeon: Jeannette Corpus, MD;  Location: [facility];  Service: Gynecology;  Laterality: N/A;  Radical tumor debulking, Bilateral Ureterolysis  . Colostomy revision  08/22/2012    Procedure: COLON RESECTION SIGMOID;  Surgeon: Jeannette Corpus, MD;  Location: [facility];  Service: Gynecology;;  Rectal Sigmoid resection and  low rectal anastomosis    History  Substance Use Topics  . Smoking status: Never Smoker   . Smokeless tobacco: Not on file  . Alcohol Use: No    REPRODUCTIVE HISTORY AND PERSONAL RISK ASSESSMENT FACTORS: Menarche was at age 38-14.   Menopausal Uterus Intact: No Ovaries Intact: No G2P2A0 , first live birth at age 62  She has not previously undergone treatment for infertility.   Never used OCPs   She has not used HRT in the past.    FAMILY HISTORY:  We obtained a detailed, 4-generation family history.  Significant diagnoses are listed below: Family History  Problem Relation Age of Onset  . Heart disease Mother   . Osteoporosis Sister   . Bladder Cancer Father 8  . Lymphoma Paternal Aunt   The patient was diagnosed with ovarian cancer at age 65.  She has four sisters and two brothers, none of whom have cancer.  However, her sisters have had similar issues and pains that Kylan had prior to her diagnosis of ovarian cancer.  Their father was diagnosed with bladder cancer at age 34.  He had two sisters and a brother.  One sister had lymphoma in her 12s.  There is a reported history of breast and ovarian cancer in their father's maternal cousins, but they could not get specific on who was affected or how they were related.  Their maternal grandfather was diagnosed with throat cancer.  Patient's maternal ancestors are of [place] descent, and paternal ancestors are of [place] and [place] descent. There is unsure Ashkenazi Jewish ancestry, as their mother's relatives were reportedly in "the camps" in [place]. There is no known consanguinity.  GENETIC COUNSELING RISK ASSESSMENT, DISCUSSION, AND SUGGESTED FOLLOW UP: We reviewed the natural history and genetic etiology of sporadic, familial and hereditary cancer syndromes.  About 1 in 8 cases of ovarian cancer is hereditary.  Of this, the majoity is the result of a BRCA1 or BRCA2 mutation.  We reviewed the red flags of hereditary cancer syndromes and  the dominant inheritance patterns.  If the BRCA testing is negative, we discussed that we could be testing for the wrong gene.  We discussed gene panels, and that several cancer genes that are associated with different cancers can be tested at the same time.  The patient became overwhelmed in the discussion of testing and cost.  She was very concerned about the potential cost of the test and essentially shut down during the discussion.  She decided that she wanted to think about testing, and would call and schedule a blood draw.  We discussed that testing could be performed at most any time, and that there is not an urgency about testing at this moment.  The patient's personal history of ovarian cancer and remote family history of breast and ovarian cancer is suggestive of the following possible diagnosis: hereditary cancer syndrome  We discussed that identification of a hereditary cancer syndrome may help her care providers tailor the patients medical management. If a mutation indicating a hereditary cancer syndrome is detected in this case, the [employer] recommendations would include increased cancer surveillance and possible prophylactic surgery. If a mutation is detected, the patient will be referred back to the referring provider and to any additional appropriate care providers to discuss the relevant options.   If a mutation is not found in the patient, this will decrease the likelihood of a hereditary cancer syndrome as the explanation for her ovarian cancer. Cancer surveillance options would be discussed for the patient according to the appropriate standard National Comprehensive Cancer Network and American Cancer Society guidelines, with consideration of their personal and family history risk factors. In this case, the patient will be referred back to their care providers for discussions of management.   After considering the risks, benefits, and limitations, the patient  declined testing at this time.  She will call and schedule a lab visit if she decides to puruse.   The patient was seen for a total of 60 minutes, greater than 50% of which was spent face-to-face counseling.  This plan is being carried out per Dr. Mariel Sleet recommendations.  This note will also be sent to the referring provider via the electronic medical record. The patient will be supplied with a summary of this genetic counseling discussion as well as educational information on the discussed hereditary cancer syndromes following the conclusion of their visit.   Patient was discussed with Dr. Drue Second.  _______________________________________________________________________ For Office Staff:  Number of people involved in session: 4 Was an Intern/ student involved with case: no

## 2012-11-28 ENCOUNTER — Ambulatory Visit (HOSPITAL_BASED_OUTPATIENT_CLINIC_OR_DEPARTMENT_OTHER): Payer: Medicare Other

## 2012-11-28 ENCOUNTER — Telehealth: Payer: Self-pay | Admitting: *Deleted

## 2012-11-28 ENCOUNTER — Other Ambulatory Visit (HOSPITAL_BASED_OUTPATIENT_CLINIC_OR_DEPARTMENT_OTHER): Payer: Medicare Other | Admitting: Lab

## 2012-11-28 VITALS — BP 105/72 | HR 82 | Temp 97.9°F

## 2012-11-28 DIAGNOSIS — C569 Malignant neoplasm of unspecified ovary: Secondary | ICD-10-CM

## 2012-11-28 DIAGNOSIS — Z5111 Encounter for antineoplastic chemotherapy: Secondary | ICD-10-CM

## 2012-11-28 LAB — CBC WITH DIFFERENTIAL/PLATELET
Basophils Absolute: 0 10*3/uL (ref 0.0–0.1)
Eosinophils Absolute: 0 10*3/uL (ref 0.0–0.5)
HGB: 11.5 g/dL — ABNORMAL LOW (ref 11.6–15.9)
MONO#: 0.3 10*3/uL (ref 0.1–0.9)
NEUT#: 2.2 10*3/uL (ref 1.5–6.5)
RDW: 18.2 % — ABNORMAL HIGH (ref 11.2–14.5)
WBC: 3.6 10*3/uL — ABNORMAL LOW (ref 3.9–10.3)
lymph#: 1.1 10*3/uL (ref 0.9–3.3)

## 2012-11-28 MED ORDER — ONDANSETRON 8 MG/50ML IVPB (CHCC)
8.0000 mg | Freq: Once | INTRAVENOUS | Status: AC
  Administered 2012-11-28: 8 mg via INTRAVENOUS

## 2012-11-28 MED ORDER — DEXAMETHASONE SODIUM PHOSPHATE 10 MG/ML IJ SOLN
5.0000 mg | Freq: Once | INTRAMUSCULAR | Status: AC
  Administered 2012-11-28: 5 mg via INTRAVENOUS

## 2012-11-28 MED ORDER — SODIUM CHLORIDE 0.9 % IV SOLN
Freq: Once | INTRAVENOUS | Status: AC
  Administered 2012-11-28: 09:00:00 via INTRAVENOUS

## 2012-11-28 MED ORDER — SODIUM CHLORIDE 0.9 % IJ SOLN
10.0000 mL | INTRAMUSCULAR | Status: DC | PRN
  Administered 2012-11-28: 10 mL
  Filled 2012-11-28: qty 10

## 2012-11-28 MED ORDER — HEPARIN SOD (PORK) LOCK FLUSH 100 UNIT/ML IV SOLN
500.0000 [IU] | Freq: Once | INTRAVENOUS | Status: AC | PRN
  Administered 2012-11-28: 500 [IU]
  Filled 2012-11-28: qty 5

## 2012-11-28 MED ORDER — PACLITAXEL CHEMO INJECTION 300 MG/50ML
64.0000 mg/m2 | Freq: Once | INTRAVENOUS | Status: AC
  Administered 2012-11-28: 108 mg via INTRAVENOUS
  Filled 2012-11-28: qty 18

## 2012-11-28 MED ORDER — FAMOTIDINE IN NACL 20-0.9 MG/50ML-% IV SOLN
20.0000 mg | Freq: Once | INTRAVENOUS | Status: AC
  Administered 2012-11-28: 20 mg via INTRAVENOUS

## 2012-11-28 MED ORDER — DIPHENHYDRAMINE HCL 50 MG/ML IJ SOLN
25.0000 mg | Freq: Once | INTRAMUSCULAR | Status: AC
  Administered 2012-11-28: 25 mg via INTRAVENOUS

## 2012-11-28 NOTE — Patient Instructions (Signed)
[facility] Discharge Instructions for Patients Receiving Chemotherapy  Today you received the following chemotherapy agents Taxol  To help prevent nausea and vomiting after your treatment, we encourage you to take your nausea medication as prescribed.  If you develop nausea and vomiting that is not controlled by your nausea medication, call the clinic. If it is after clinic hours your family physician or the after hours number for the clinic or go to the Emergency Department.   BELOW ARE SYMPTOMS THAT SHOULD BE REPORTED IMMEDIATELY:  *FEVER GREATER THAN 100.5 F  *CHILLS WITH OR WITHOUT FEVER  NAUSEA AND VOMITING THAT IS NOT CONTROLLED WITH YOUR NAUSEA MEDICATION  *UNUSUAL SHORTNESS OF BREATH  *UNUSUAL BRUISING OR BLEEDING  TENDERNESS IN MOUTH AND THROAT WITH OR WITHOUT PRESENCE OF ULCERS  *URINARY PROBLEMS  *BOWEL PROBLEMS  UNUSUAL RASH Items with * indicate a potential emergency and should be followed up as soon as possible.  One of the nurses will contact you 24 hours after your treatment. Please let the nurse know about any problems that you may have experienced. Feel free to call the clinic you have any questions or concerns. The clinic phone number is [phone].   I have been informed and understand all the instructions given to me. I know to contact the clinic, my physician, or go to the Emergency Department if any problems should occur. I do not have any questions at this time, but understand that I may call the clinic during office hours   should I have any questions or need assistance in obtaining follow up care.    __________________________________________  _____________  __________ Signature of Patient or Authorized Representative            Date                   Time    __________________________________________ Nurse's Signature    

## 2012-11-28 NOTE — Telephone Encounter (Signed)
Call from pt reporting she has trouble sleeping after chemo. Zolpidem has not been effective. Instructed her to try 2 Valium tonight for sleep. She voiced understanding. Stated she has not had as much anxiety and has not needed Valium during the day.

## 2012-12-01 ENCOUNTER — Other Ambulatory Visit (HOSPITAL_COMMUNITY): Payer: Medicare Other

## 2012-12-05 ENCOUNTER — Ambulatory Visit (HOSPITAL_BASED_OUTPATIENT_CLINIC_OR_DEPARTMENT_OTHER): Payer: Medicare Other

## 2012-12-05 ENCOUNTER — Other Ambulatory Visit: Payer: Self-pay | Admitting: Oncology

## 2012-12-05 ENCOUNTER — Ambulatory Visit (HOSPITAL_BASED_OUTPATIENT_CLINIC_OR_DEPARTMENT_OTHER): Payer: Medicare Other | Admitting: Oncology

## 2012-12-05 ENCOUNTER — Other Ambulatory Visit (HOSPITAL_BASED_OUTPATIENT_CLINIC_OR_DEPARTMENT_OTHER): Payer: Medicare Other | Admitting: Lab

## 2012-12-05 VITALS — BP 119/80 | HR 97 | Temp 98.3°F | Resp 20 | Ht 65.5 in | Wt 140.2 lb

## 2012-12-05 DIAGNOSIS — F411 Generalized anxiety disorder: Secondary | ICD-10-CM

## 2012-12-05 DIAGNOSIS — Z5111 Encounter for antineoplastic chemotherapy: Secondary | ICD-10-CM

## 2012-12-05 DIAGNOSIS — C569 Malignant neoplasm of unspecified ovary: Secondary | ICD-10-CM

## 2012-12-05 DIAGNOSIS — R109 Unspecified abdominal pain: Secondary | ICD-10-CM

## 2012-12-05 LAB — CBC WITH DIFFERENTIAL/PLATELET
Basophils Absolute: 0 10*3/uL (ref 0.0–0.1)
Eosinophils Absolute: 0 10*3/uL (ref 0.0–0.5)
HGB: 11.6 g/dL (ref 11.6–15.9)
MONO#: 0.4 10*3/uL (ref 0.1–0.9)
NEUT#: 1.9 10*3/uL (ref 1.5–6.5)
RBC: 3.48 10*6/uL — ABNORMAL LOW (ref 3.70–5.45)
RDW: 18.5 % — ABNORMAL HIGH (ref 11.2–14.5)
WBC: 3.3 10*3/uL — ABNORMAL LOW (ref 3.9–10.3)
lymph#: 1 10*3/uL (ref 0.9–3.3)
nRBC: 0 % (ref 0–0)

## 2012-12-05 MED ORDER — SODIUM CHLORIDE 0.9 % IJ SOLN
10.0000 mL | INTRAMUSCULAR | Status: DC | PRN
  Administered 2012-12-05: 10 mL
  Filled 2012-12-05: qty 10

## 2012-12-05 MED ORDER — DIPHENHYDRAMINE HCL 50 MG/ML IJ SOLN
25.0000 mg | Freq: Once | INTRAMUSCULAR | Status: AC
  Administered 2012-12-05: 25 mg via INTRAVENOUS

## 2012-12-05 MED ORDER — HEPARIN SOD (PORK) LOCK FLUSH 100 UNIT/ML IV SOLN
500.0000 [IU] | Freq: Once | INTRAVENOUS | Status: AC | PRN
  Administered 2012-12-05: 500 [IU]
  Filled 2012-12-05: qty 5

## 2012-12-05 MED ORDER — ONDANSETRON 8 MG/50ML IVPB (CHCC)
8.0000 mg | Freq: Once | INTRAVENOUS | Status: AC
  Administered 2012-12-05: 8 mg via INTRAVENOUS

## 2012-12-05 MED ORDER — SODIUM CHLORIDE 0.9 % IV SOLN
Freq: Once | INTRAVENOUS | Status: AC
  Administered 2012-12-05: 12:00:00 via INTRAVENOUS

## 2012-12-05 MED ORDER — DIAZEPAM 5 MG PO TABS: ORAL_TABLET | ORAL | Status: DC

## 2012-12-05 MED ORDER — DEXAMETHASONE SODIUM PHOSPHATE 10 MG/ML IJ SOLN
5.0000 mg | Freq: Once | INTRAMUSCULAR | Status: AC
  Administered 2012-12-05: 5 mg via INTRAVENOUS

## 2012-12-05 MED ORDER — FAMOTIDINE IN NACL 20-0.9 MG/50ML-% IV SOLN
20.0000 mg | Freq: Once | INTRAVENOUS | Status: AC
  Administered 2012-12-05: 20 mg via INTRAVENOUS

## 2012-12-05 MED ORDER — PACLITAXEL CHEMO INJECTION 300 MG/50ML
64.0000 mg/m2 | Freq: Once | INTRAVENOUS | Status: AC
  Administered 2012-12-05: 108 mg via INTRAVENOUS
  Filled 2012-12-05: qty 18

## 2012-12-05 NOTE — Progress Notes (Signed)
Refill for Valium called to [facility] automated line

## 2012-12-05 NOTE — Telephone Encounter (Signed)
Per staff message and POF I have scheduled appts.  JMW  

## 2012-12-05 NOTE — Patient Instructions (Signed)
[facility] Discharge Instructions for Patients Receiving Chemotherapy  Today you received the following chemotherapy agents Taxol.  To help prevent nausea and vomiting after your treatment, we encourage you to take your nausea medication.   If you develop nausea and vomiting that is not controlled by your nausea medication, call the clinic. If it is after clinic hours your family physician or the after hours number for the clinic or go to the Emergency Department.   BELOW ARE SYMPTOMS THAT SHOULD BE REPORTED IMMEDIATELY:  *FEVER GREATER THAN 100.5 F  *CHILLS WITH OR WITHOUT FEVER  NAUSEA AND VOMITING THAT IS NOT CONTROLLED WITH YOUR NAUSEA MEDICATION  *UNUSUAL SHORTNESS OF BREATH  *UNUSUAL BRUISING OR BLEEDING  TENDERNESS IN MOUTH AND THROAT WITH OR WITHOUT PRESENCE OF ULCERS  *URINARY PROBLEMS  *BOWEL PROBLEMS  UNUSUAL RASH Items with * indicate a potential emergency and should be followed up as soon as possible.  One of the nurses will contact you 24 hours after your treatment. Please let the nurse know about any problems that you may have experienced. Feel free to call the clinic you have any questions or concerns. The clinic phone number is [phone].   I have been informed and understand all the instructions given to me. I know to contact the clinic, my physician, or go to the Emergency Department if any problems should occur. I do not have any questions at this time, but understand that I may call the clinic during office hours   should I have any questions or need assistance in obtaining follow up care.    __________________________________________  _____________  __________ Signature of Patient or Authorized Representative            Date                   Time    __________________________________________ Nurse's Signature

## 2012-12-05 NOTE — Progress Notes (Signed)
   [facility]    OFFICE PROGRESS NOTE   INTERVAL HISTORY:   She returns as scheduled. She continues treatment with Taxol/carboplatin. No nausea, mouth sores, or diarrhea. She reports constipation. No significant neuropathy symptoms. Her chief complaint continues to be anxiety. Valium has helped partially. She also has insomnia. She slept 4 hours after taking Ambien. She is scheduled to see psychiatry next week.  Ms. Indelicato continues to have discomfort in the right lower abdomen. She plans to schedule a GYN oncology appointment.  Objective:  Vital signs in last 24 hours:  Blood pressure 119/80, pulse 97, temperature 98.3 F (36.8 C), temperature source Oral, resp. rate 20, height 5' 5.5" (1.664 m), weight 140 lb 3.2 oz (63.594 kg).    HEENT: No thrush or ulcers Resp: Lungs clear bilaterally Cardio: Regular rate and rhythm GI: No hepatosplenomegaly, no mass, mild tenderness in the bilateral low abdomen, no apparent ascites Vascular: No leg edema   Portacath/PICC-without erythema  Lab Results:  Lab Results  Component Value Date   WBC 3.3* 12/05/2012   HGB 11.6 12/05/2012   HCT 35.1 12/05/2012   MCV 100.9 12/05/2012   PLT 110* 12/05/2012   ANC 1.9  CA 125 on 11/21/2012-4.3  Medications: I have reviewed the patient's current medications.  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 A 5 mm nodules remain on the right diaphragm. -Cycle 1 of adjuvant Taxol/carboplatin chemotherapy initiated on 09/19/2012 , the CA 125 has normalized.  2. Low abdomen/suprapubic pain prior to the exploratory laparotomy-likely secondary to omental/pelvic tumor , persistent mild pain in the right low abdomen-likely postoperative pain , she plans to schedule a GYN oncology evaluation 3. Chronic neck and back pain  4. Anxiety  -persistent despite Lexapro and Xanax, now trying Valium. She is scheduled to see psychiatry during the week of 12/11/2012.  5. Status post Port-A-Cath placement 09/22/2012  6. Neutropenia secondary to chemotherapy- day 15 cycle 1 and cycle 3 Taxol/carboplatin not given secondary to neutropenia   Disposition:  She will complete day 15 of cycle 4 Taxol/carboplatin today. She appears to be tolerating the chemotherapy well. Her chief issue continues to be extreme anxiety. I had a lengthy discussion with her today as did the nursing staff. She will discontinue Ambien. She will use Valium during the day for anxiety and in the evening for insomnia.  Ms. Depaul will begin cycle 5 Taxol/carboplatin on 12/12/2012. She is scheduled for an office visit on 01/02/2013. We continue to follow the CA 125.   Thornton Papas, MD  12/05/2012  5:49 PM

## 2012-12-05 NOTE — Telephone Encounter (Signed)
gv pt appt shedule for pt for March and April...michelle add tx

## 2012-12-10 ENCOUNTER — Other Ambulatory Visit: Payer: Self-pay | Admitting: Oncology

## 2012-12-12 ENCOUNTER — Ambulatory Visit (HOSPITAL_BASED_OUTPATIENT_CLINIC_OR_DEPARTMENT_OTHER): Payer: Medicare Other

## 2012-12-12 ENCOUNTER — Other Ambulatory Visit (HOSPITAL_BASED_OUTPATIENT_CLINIC_OR_DEPARTMENT_OTHER): Payer: Medicare Other | Admitting: Lab

## 2012-12-12 DIAGNOSIS — C569 Malignant neoplasm of unspecified ovary: Secondary | ICD-10-CM

## 2012-12-12 LAB — CBC WITH DIFFERENTIAL/PLATELET
BASO%: 0.4 % (ref 0.0–2.0)
EOS%: 0.4 % (ref 0.0–7.0)
MCH: 33.9 pg (ref 25.1–34.0)
MCHC: 33 g/dL (ref 31.5–36.0)
MONO%: 9.2 % (ref 0.0–14.0)
NEUT%: 53.4 % (ref 38.4–76.8)
RDW: 19 % — ABNORMAL HIGH (ref 11.2–14.5)
lymph#: 0.9 10*3/uL (ref 0.9–3.3)

## 2012-12-12 NOTE — Progress Notes (Signed)
Pt in for chemo today.  Counts low.  No chemo today per Dr. Truett Perna.  Pt instructed to call for any fevers, bleeding, s/s of infection.  Pt anxious and concerned.  RN reassured patient that if she calls with concerns she will be ok.  Pt to see psychiatrist tomorrow.  Pt. Verbalized understanding.

## 2012-12-15 ENCOUNTER — Encounter: Payer: Self-pay | Admitting: Oncology

## 2012-12-15 NOTE — Progress Notes (Signed)
Put daughter's fmla form on nurse's desk. °

## 2012-12-17 ENCOUNTER — Other Ambulatory Visit: Payer: Self-pay | Admitting: Oncology

## 2012-12-18 ENCOUNTER — Encounter: Payer: Self-pay | Admitting: Oncology

## 2012-12-18 NOTE — Progress Notes (Signed)
Faxed daughter's fmla form to [employer] @ [phone] °

## 2012-12-19 ENCOUNTER — Ambulatory Visit (HOSPITAL_BASED_OUTPATIENT_CLINIC_OR_DEPARTMENT_OTHER): Payer: Medicare Other

## 2012-12-19 ENCOUNTER — Other Ambulatory Visit (HOSPITAL_BASED_OUTPATIENT_CLINIC_OR_DEPARTMENT_OTHER): Payer: Medicare Other | Admitting: Lab

## 2012-12-19 VITALS — BP 111/67 | HR 76 | Temp 98.0°F | Resp 20

## 2012-12-19 DIAGNOSIS — C569 Malignant neoplasm of unspecified ovary: Secondary | ICD-10-CM

## 2012-12-19 DIAGNOSIS — Z5111 Encounter for antineoplastic chemotherapy: Secondary | ICD-10-CM

## 2012-12-19 DIAGNOSIS — R1909 Other intra-abdominal and pelvic swelling, mass and lump: Secondary | ICD-10-CM

## 2012-12-19 DIAGNOSIS — C482 Malignant neoplasm of peritoneum, unspecified: Secondary | ICD-10-CM

## 2012-12-19 LAB — CBC WITH DIFFERENTIAL/PLATELET
BASO%: 0 % (ref 0.0–2.0)
Eosinophils Absolute: 0 10*3/uL (ref 0.0–0.5)
MCHC: 32.5 g/dL (ref 31.5–36.0)
MCV: 104.8 fL — ABNORMAL HIGH (ref 79.5–101.0)
MONO%: 13 % (ref 0.0–14.0)
NEUT#: 1.7 10*3/uL (ref 1.5–6.5)
RBC: 3.35 10*6/uL — ABNORMAL LOW (ref 3.70–5.45)
RDW: 20.7 % — ABNORMAL HIGH (ref 11.2–14.5)
WBC: 3 10*3/uL — ABNORMAL LOW (ref 3.9–10.3)
nRBC: 0 % (ref 0–0)

## 2012-12-19 LAB — COMPREHENSIVE METABOLIC PANEL (CC13)
ALT: 37 U/L (ref 0–55)
BUN: 16.4 mg/dL (ref 7.0–26.0)
CO2: 26 mEq/L (ref 22–29)
Calcium: 8.9 mg/dL (ref 8.4–10.4)
Creatinine: 0.7 mg/dL (ref 0.6–1.1)
Glucose: 135 mg/dl — ABNORMAL HIGH (ref 70–99)
Total Bilirubin: 0.33 mg/dL (ref 0.20–1.20)

## 2012-12-19 LAB — CA 125: CA 125: 4.5 U/mL (ref 0.0–30.2)

## 2012-12-19 MED ORDER — SODIUM CHLORIDE 0.9 % IV SOLN
594.6000 mg | Freq: Once | INTRAVENOUS | Status: AC
  Administered 2012-12-19: 590 mg via INTRAVENOUS
  Filled 2012-12-19: qty 59

## 2012-12-19 MED ORDER — SODIUM CHLORIDE 0.9 % IJ SOLN
10.0000 mL | INTRAMUSCULAR | Status: DC | PRN
  Administered 2012-12-19: 10 mL
  Filled 2012-12-19: qty 10

## 2012-12-19 MED ORDER — DIPHENHYDRAMINE HCL 50 MG/ML IJ SOLN
25.0000 mg | Freq: Once | INTRAMUSCULAR | Status: AC
  Administered 2012-12-19: 25 mg via INTRAVENOUS

## 2012-12-19 MED ORDER — HEPARIN SOD (PORK) LOCK FLUSH 100 UNIT/ML IV SOLN
500.0000 [IU] | Freq: Once | INTRAVENOUS | Status: AC | PRN
  Administered 2012-12-19: 500 [IU]
  Filled 2012-12-19: qty 5

## 2012-12-19 MED ORDER — ONDANSETRON 16 MG/50ML IVPB (CHCC)
16.0000 mg | Freq: Once | INTRAVENOUS | Status: AC
  Administered 2012-12-19: 16 mg via INTRAVENOUS

## 2012-12-19 MED ORDER — SODIUM CHLORIDE 0.9 % IV SOLN
Freq: Once | INTRAVENOUS | Status: AC
  Administered 2012-12-19: 10:00:00 via INTRAVENOUS

## 2012-12-19 MED ORDER — FAMOTIDINE IN NACL 20-0.9 MG/50ML-% IV SOLN
20.0000 mg | Freq: Once | INTRAVENOUS | Status: AC
  Administered 2012-12-19: 20 mg via INTRAVENOUS

## 2012-12-19 MED ORDER — PACLITAXEL CHEMO INJECTION 300 MG/50ML
64.0000 mg/m2 | Freq: Once | INTRAVENOUS | Status: AC
  Administered 2012-12-19: 108 mg via INTRAVENOUS
  Filled 2012-12-19: qty 18

## 2012-12-19 MED ORDER — DEXAMETHASONE SODIUM PHOSPHATE 10 MG/ML IJ SOLN
5.0000 mg | Freq: Once | INTRAMUSCULAR | Status: AC
  Administered 2012-12-19: 5 mg via INTRAVENOUS

## 2012-12-19 NOTE — Patient Instructions (Signed)
[facility] Discharge Instructions for Patients Receiving Chemotherapy Today you received the following chemotherapy agents Taxol/Carbo  To help prevent nausea and vomiting after your treatment, we encourage you to take your nausea medication as directed.   If you develop nausea and vomiting that is not controlled by your nausea medication, call the clinic. If it is after clinic hours your family physician or the after hours number for the clinic or go to the Emergency Department.   BELOW ARE SYMPTOMS THAT SHOULD BE REPORTED IMMEDIATELY:  *FEVER GREATER THAN 100.5 F  *CHILLS WITH OR WITHOUT FEVER  NAUSEA AND VOMITING THAT IS NOT CONTROLLED WITH YOUR NAUSEA MEDICATION  *UNUSUAL SHORTNESS OF BREATH  *UNUSUAL BRUISING OR BLEEDING  TENDERNESS IN MOUTH AND THROAT WITH OR WITHOUT PRESENCE OF ULCERS  *URINARY PROBLEMS  *BOWEL PROBLEMS  UNUSUAL RASH Items with * indicate a potential emergency and should be followed up as soon as possible.  One of the nurses will contact you 24 hours after your treatment. Please let the nurse know about any problems that you may have experienced. Feel free to call the clinic you have any questions or concerns. The clinic phone number is [phone].   I have been informed and understand all the instructions given to me. I know to contact the clinic, my physician, or go to the Emergency Department if any problems should occur. I do not have any questions at this time, but understand that I may call the clinic during office hours   should I have any questions or need assistance in obtaining follow up care.    __________________________________________  _____________  __________ Signature of Patient or Authorized Representative            Date                   Time    __________________________________________ Nurse's Signature    

## 2012-12-20 ENCOUNTER — Telehealth: Payer: Self-pay | Admitting: *Deleted

## 2012-12-20 NOTE — Telephone Encounter (Signed)
Left VM asking if it is OK for her to resume her cholesterol medication?

## 2012-12-21 ENCOUNTER — Telehealth: Payer: Self-pay | Admitting: *Deleted

## 2012-12-21 NOTE — Telephone Encounter (Signed)
Patient has left VM twice asking if OK for her to take her cholesterol med pravastatin 40 mg daily? Concerned about her cholesterol since she is eating more eggs and cheese now with chemo. P Made her aware that per Dr. Truett Perna, OK to take med. Updated med list.

## 2012-12-26 ENCOUNTER — Other Ambulatory Visit (HOSPITAL_BASED_OUTPATIENT_CLINIC_OR_DEPARTMENT_OTHER): Payer: Medicare Other | Admitting: Lab

## 2012-12-26 ENCOUNTER — Telehealth: Payer: Self-pay | Admitting: Pulmonary Disease

## 2012-12-26 ENCOUNTER — Ambulatory Visit (HOSPITAL_BASED_OUTPATIENT_CLINIC_OR_DEPARTMENT_OTHER): Payer: Medicare Other

## 2012-12-26 VITALS — BP 117/74 | HR 75 | Temp 98.1°F | Resp 16

## 2012-12-26 DIAGNOSIS — C569 Malignant neoplasm of unspecified ovary: Secondary | ICD-10-CM

## 2012-12-26 DIAGNOSIS — Z5111 Encounter for antineoplastic chemotherapy: Secondary | ICD-10-CM

## 2012-12-26 LAB — CBC WITH DIFFERENTIAL/PLATELET
Basophils Absolute: 0 10*3/uL (ref 0.0–0.1)
Eosinophils Absolute: 0 10*3/uL (ref 0.0–0.5)
HCT: 33.2 % — ABNORMAL LOW (ref 34.8–46.6)
HGB: 10.9 g/dL — ABNORMAL LOW (ref 11.6–15.9)
MONO#: 0.3 10*3/uL (ref 0.1–0.9)
NEUT#: 1.5 10*3/uL (ref 1.5–6.5)
NEUT%: 54.3 % (ref 38.4–76.8)
RDW: 19.8 % — ABNORMAL HIGH (ref 11.2–14.5)
WBC: 2.7 10*3/uL — ABNORMAL LOW (ref 3.9–10.3)
lymph#: 0.9 10*3/uL (ref 0.9–3.3)

## 2012-12-26 MED ORDER — ONDANSETRON 8 MG/50ML IVPB (CHCC)
8.0000 mg | Freq: Once | INTRAVENOUS | Status: AC
  Administered 2012-12-26: 8 mg via INTRAVENOUS

## 2012-12-26 MED ORDER — HEPARIN SOD (PORK) LOCK FLUSH 100 UNIT/ML IV SOLN
500.0000 [IU] | Freq: Once | INTRAVENOUS | Status: AC | PRN
  Administered 2012-12-26: 500 [IU]
  Filled 2012-12-26: qty 5

## 2012-12-26 MED ORDER — DIPHENHYDRAMINE HCL 50 MG/ML IJ SOLN
25.0000 mg | Freq: Once | INTRAMUSCULAR | Status: AC
  Administered 2012-12-26: 25 mg via INTRAVENOUS

## 2012-12-26 MED ORDER — SODIUM CHLORIDE 0.9 % IV SOLN
64.0000 mg/m2 | Freq: Once | INTRAVENOUS | Status: AC
  Administered 2012-12-26: 108 mg via INTRAVENOUS
  Filled 2012-12-26: qty 18

## 2012-12-26 MED ORDER — DEXAMETHASONE SODIUM PHOSPHATE 10 MG/ML IJ SOLN
5.0000 mg | Freq: Once | INTRAMUSCULAR | Status: AC
  Administered 2012-12-26: 5 mg via INTRAVENOUS

## 2012-12-26 MED ORDER — SODIUM CHLORIDE 0.9 % IV SOLN
Freq: Once | INTRAVENOUS | Status: AC
  Administered 2012-12-26: 10:00:00 via INTRAVENOUS

## 2012-12-26 MED ORDER — SODIUM CHLORIDE 0.9 % IJ SOLN
10.0000 mL | INTRAMUSCULAR | Status: DC | PRN
  Administered 2012-12-26: 10 mL
  Filled 2012-12-26: qty 10

## 2012-12-26 MED ORDER — FAMOTIDINE IN NACL 20-0.9 MG/50ML-% IV SOLN
20.0000 mg | Freq: Once | INTRAVENOUS | Status: AC
  Administered 2012-12-26: 20 mg via INTRAVENOUS

## 2012-12-26 NOTE — Telephone Encounter (Signed)
Spoke with patient, patient has upcoming appt with Dr. Kriste Basque 01/24/13-- a physical. Patient would like to know if she should still keep this appt since she is undergoing chemotherapy. Patient states she has two months left on chemo. Patient states that if she does not need to keep this appt she will need some cholesterol medication prescribed (per pt has had to increase her intake of eggs and higher cholesterol foods to help increase protein) Dr. Kriste Basque, please advise, thank you!  Last OV:07/05/12 Next OV: 01/24/13  Allergies  Allergen Reactions  . Atorvastatin     REACTION: causes leg pain  . Decongest-Aid (Pseudoephedrine)     Makes my heart flutter  . Macrodantin

## 2012-12-26 NOTE — Telephone Encounter (Signed)
Per SN from the last ov note---  Follow up in 6 months.   Keep this appt with SN in April.  thanks

## 2012-12-26 NOTE — Progress Notes (Signed)
Discharged at 11:55 by herself to home, ambulating well in no distress.

## 2012-12-26 NOTE — Patient Instructions (Signed)
[facility] Discharge Instructions for Patients Receiving Chemotherapy  Today you received the following chemotherapy agents taxol.  To help prevent nausea and vomiting after your treatment, we encourage you to take your nausea medication compazine. Begin taking it at anytime upon discharge home and take it as often as prescribed for the next 72 hours and as needed.   If you develop nausea and vomiting that is not controlled by your nausea medication, call the clinic. If it is after clinic hours your family physician or the after hours number for the clinic or go to the Emergency Department.   BELOW ARE SYMPTOMS THAT SHOULD BE REPORTED IMMEDIATELY:  *FEVER GREATER THAN 100.5 F  *CHILLS WITH OR WITHOUT FEVER  NAUSEA AND VOMITING THAT IS NOT CONTROLLED WITH YOUR NAUSEA MEDICATION  *UNUSUAL SHORTNESS OF BREATH  *UNUSUAL BRUISING OR BLEEDING  TENDERNESS IN MOUTH AND THROAT WITH OR WITHOUT PRESENCE OF ULCERS  *URINARY PROBLEMS  *BOWEL PROBLEMS  UNUSUAL RASH Items with * indicate a potential emergency and should be followed up as soon as possible.  Please call to let a nurse know about any problems that you may have experienced. Feel free to call the clinic you have any questions or concerns. The clinic phone number is [phone].   I have been informed and understand all the instructions given to me. I know to contact the clinic, my physician, or go to the Emergency Department if any problems should occur. I do not have any questions at this time, but understand that I may call the clinic during office hours   should I have any questions or need assistance in obtaining follow up care.    __________________________________________  _____________  __________ Signature of Patient or Authorized Representative            Date                   Time    __________________________________________ Nurse's Signature

## 2012-12-26 NOTE — Telephone Encounter (Signed)
Pt is aware to keep her appointment in April.

## 2012-12-30 ENCOUNTER — Other Ambulatory Visit: Payer: Self-pay | Admitting: Oncology

## 2013-01-01 ENCOUNTER — Ambulatory Visit: Payer: Medicare Other | Admitting: Gynecology

## 2013-01-02 ENCOUNTER — Other Ambulatory Visit: Payer: Medicare Other | Admitting: Lab

## 2013-01-02 ENCOUNTER — Ambulatory Visit (HOSPITAL_BASED_OUTPATIENT_CLINIC_OR_DEPARTMENT_OTHER): Payer: Medicare Other | Admitting: Oncology

## 2013-01-02 ENCOUNTER — Telehealth: Payer: Self-pay | Admitting: Oncology

## 2013-01-02 ENCOUNTER — Other Ambulatory Visit (HOSPITAL_BASED_OUTPATIENT_CLINIC_OR_DEPARTMENT_OTHER): Payer: Medicare Other | Admitting: Lab

## 2013-01-02 ENCOUNTER — Telehealth: Payer: Self-pay | Admitting: *Deleted

## 2013-01-02 ENCOUNTER — Ambulatory Visit (HOSPITAL_BASED_OUTPATIENT_CLINIC_OR_DEPARTMENT_OTHER): Payer: Medicare Other

## 2013-01-02 ENCOUNTER — Ambulatory Visit: Payer: Medicare Other | Admitting: Nurse Practitioner

## 2013-01-02 ENCOUNTER — Ambulatory Visit: Payer: Medicare Other | Admitting: Oncology

## 2013-01-02 VITALS — BP 107/75 | HR 78 | Temp 98.1°F | Resp 20 | Ht 65.5 in | Wt 141.9 lb

## 2013-01-02 DIAGNOSIS — C569 Malignant neoplasm of unspecified ovary: Secondary | ICD-10-CM

## 2013-01-02 DIAGNOSIS — D702 Other drug-induced agranulocytosis: Secondary | ICD-10-CM

## 2013-01-02 DIAGNOSIS — F411 Generalized anxiety disorder: Secondary | ICD-10-CM

## 2013-01-02 DIAGNOSIS — Z5111 Encounter for antineoplastic chemotherapy: Secondary | ICD-10-CM

## 2013-01-02 DIAGNOSIS — R109 Unspecified abdominal pain: Secondary | ICD-10-CM

## 2013-01-02 LAB — COMPREHENSIVE METABOLIC PANEL (CC13)
ALT: 35 U/L (ref 0–55)
Alkaline Phosphatase: 58 U/L (ref 40–150)
CO2: 26 mEq/L (ref 22–29)
Sodium: 139 mEq/L (ref 136–145)
Total Bilirubin: 0.36 mg/dL (ref 0.20–1.20)
Total Protein: 6.7 g/dL (ref 6.4–8.3)

## 2013-01-02 LAB — CBC WITH DIFFERENTIAL/PLATELET
BASO%: 0.4 % (ref 0.0–2.0)
Basophils Absolute: 0 10*3/uL (ref 0.0–0.1)
EOS%: 0 % (ref 0.0–7.0)
HGB: 10.7 g/dL — ABNORMAL LOW (ref 11.6–15.9)
MCH: 35.1 pg — ABNORMAL HIGH (ref 25.1–34.0)
RDW: 19.5 % — ABNORMAL HIGH (ref 11.2–14.5)
WBC: 2.6 10*3/uL — ABNORMAL LOW (ref 3.9–10.3)
lymph#: 1.1 10*3/uL (ref 0.9–3.3)

## 2013-01-02 LAB — CA 125: CA 125: 4.9 U/mL (ref 0.0–30.2)

## 2013-01-02 MED ORDER — SODIUM CHLORIDE 0.9 % IV SOLN
64.0000 mg/m2 | Freq: Once | INTRAVENOUS | Status: AC
  Administered 2013-01-02: 108 mg via INTRAVENOUS
  Filled 2013-01-02: qty 18

## 2013-01-02 MED ORDER — ONDANSETRON 8 MG/50ML IVPB (CHCC)
8.0000 mg | Freq: Once | INTRAVENOUS | Status: AC
  Administered 2013-01-02: 8 mg via INTRAVENOUS

## 2013-01-02 MED ORDER — DEXAMETHASONE SODIUM PHOSPHATE 10 MG/ML IJ SOLN
5.0000 mg | Freq: Once | INTRAMUSCULAR | Status: AC
  Administered 2013-01-02: 5 mg via INTRAVENOUS

## 2013-01-02 MED ORDER — DIPHENHYDRAMINE HCL 50 MG/ML IJ SOLN
25.0000 mg | Freq: Once | INTRAMUSCULAR | Status: AC
  Administered 2013-01-02: 25 mg via INTRAVENOUS

## 2013-01-02 MED ORDER — FAMOTIDINE IN NACL 20-0.9 MG/50ML-% IV SOLN
20.0000 mg | Freq: Once | INTRAVENOUS | Status: AC
  Administered 2013-01-02: 20 mg via INTRAVENOUS

## 2013-01-02 NOTE — Progress Notes (Signed)
   [facility]    OFFICE PROGRESS NOTE   INTERVAL HISTORY:   She returns as scheduled. She saw psychiatry and reports taking an increased dose of Lexapro with continuation of Valium. She continues to have intermittent discomfort at the low right abdomen. No significant neuropathy symptoms.  Objective:  Vital signs in last 24 hours:  Blood pressure 107/75, pulse 78, temperature 98.1 F (36.7 C), temperature source Oral, resp. rate 20, height 5' 5.5" (1.664 m), weight 141 lb 14.4 oz (64.365 kg).    HEENT: No thrush or ulcers Resp: Lungs clear bilaterally Cardio: Regular rate and rhythm GI: No apparent ascites, no mass, no hepatomegaly, no tenderness Vascular: No leg edema  Portacath/PICC-without erythema  Lab Results:  Lab Results  Component Value Date   WBC 2.6* 01/02/2013   HGB 10.7* 01/02/2013   HCT 32.8* 01/02/2013   MCV 107.5* 01/02/2013   PLT 131* 01/02/2013   ANC 1.2  CA 125 on 12/19/2012-4.5  Medications: I have reviewed the patient's current medications.  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 A 5 mm nodules remain on the right diaphragm. -Cycle 1 of adjuvant Taxol/carboplatin chemotherapy initiated on 09/19/2012 , the CA 125 has normalized.  2. Low abdomen/suprapubic pain prior to the exploratory laparotomy-likely secondary to omental/pelvic tumor , persistent mild pain in the right low abdomen-likely postoperative pain , she plans to schedule a GYN oncology evaluation  3. Chronic neck and back pain  4. Anxiety -persistent despite Lexapro and Xanax, now taking Valium. She has been evaluated by psychiatry. 5. Status post Port-A-Cath placement 09/22/2012  6. Neutropenia secondary to chemotherapy- day 15 cycle 1 and cycle 3 Taxol/carboplatin not given secondary to neutropenia      Disposition:  She will complete day 15 of cycle 5 Taxol/carboplatin today. She knows to contact us for a fever or signs of infection. Cycle 6 will be delayed for one week secondary to the mild neutropenia and thrombocytopenia. She will begin cycle 6 on 01/16/2013. Ms. Hugh will return for an office visit on 01/30/2013.   Thornton Papas, MD  01/02/2013  4:25 PM

## 2013-01-02 NOTE — Patient Instructions (Addendum)
[facility] Discharge Instructions for Patients Receiving Chemotherapy  Today you received the following chemotherapy agents: Taxol To help prevent nausea and vomiting after your treatment, we encourage you to take your nausea medication   If you develop nausea and vomiting that is not controlled by your nausea medication, call the clinic.   BELOW ARE SYMPTOMS THAT SHOULD BE REPORTED IMMEDIATELY:  *FEVER GREATER THAN 100.5 F  *CHILLS WITH OR WITHOUT FEVER  NAUSEA AND VOMITING THAT IS NOT CONTROLLED WITH YOUR NAUSEA MEDICATION  *UNUSUAL SHORTNESS OF BREATH  *UNUSUAL BRUISING OR BLEEDING  TENDERNESS IN MOUTH AND THROAT WITH OR WITHOUT PRESENCE OF ULCERS  *URINARY PROBLEMS  *BOWEL PROBLEMS  UNUSUAL RASH

## 2013-01-02 NOTE — Telephone Encounter (Signed)
Per staff phone call and POF I have schedueld appts.  JMW  

## 2013-01-03 ENCOUNTER — Ambulatory Visit: Payer: Medicare Other | Admitting: Pulmonary Disease

## 2013-01-04 ENCOUNTER — Other Ambulatory Visit: Payer: Self-pay | Admitting: Certified Registered Nurse Anesthetist

## 2013-01-16 ENCOUNTER — Other Ambulatory Visit (HOSPITAL_BASED_OUTPATIENT_CLINIC_OR_DEPARTMENT_OTHER): Payer: Medicare Other | Admitting: Lab

## 2013-01-16 ENCOUNTER — Ambulatory Visit (HOSPITAL_BASED_OUTPATIENT_CLINIC_OR_DEPARTMENT_OTHER): Payer: Medicare Other

## 2013-01-16 ENCOUNTER — Other Ambulatory Visit: Payer: Self-pay | Admitting: Oncology

## 2013-01-16 VITALS — BP 107/70 | HR 66 | Temp 97.3°F | Resp 18

## 2013-01-16 DIAGNOSIS — Z5111 Encounter for antineoplastic chemotherapy: Secondary | ICD-10-CM

## 2013-01-16 DIAGNOSIS — C569 Malignant neoplasm of unspecified ovary: Secondary | ICD-10-CM

## 2013-01-16 LAB — COMPREHENSIVE METABOLIC PANEL
ALT: 23 U/L (ref 0–35)
AST: 20 U/L (ref 0–37)
Albumin: 3.7 g/dL (ref 3.5–5.2)
Alkaline Phosphatase: 57 U/L (ref 39–117)
Calcium: 9.3 mg/dL (ref 8.4–10.5)
Chloride: 103 mEq/L (ref 96–112)
Potassium: 4.1 mEq/L (ref 3.5–5.3)
Sodium: 139 mEq/L (ref 135–145)

## 2013-01-16 LAB — CBC WITH DIFFERENTIAL/PLATELET
Basophils Absolute: 0 10*3/uL (ref 0.0–0.1)
HCT: 31.7 % — ABNORMAL LOW (ref 34.8–46.6)
HGB: 10.4 g/dL — ABNORMAL LOW (ref 11.6–15.9)
LYMPH%: 30.6 % (ref 14.0–49.7)
MCH: 36.7 pg — ABNORMAL HIGH (ref 25.1–34.0)
MONO#: 0.5 10*3/uL (ref 0.1–0.9)
NEUT%: 54.9 % (ref 38.4–76.8)
Platelets: 150 10*3/uL (ref 145–400)
WBC: 3.5 10*3/uL — ABNORMAL LOW (ref 3.9–10.3)
lymph#: 1.1 10*3/uL (ref 0.9–3.3)

## 2013-01-16 MED ORDER — DEXAMETHASONE SODIUM PHOSPHATE 10 MG/ML IJ SOLN
5.0000 mg | Freq: Once | INTRAMUSCULAR | Status: AC
  Administered 2013-01-16: 5 mg via INTRAVENOUS

## 2013-01-16 MED ORDER — FAMOTIDINE IN NACL 20-0.9 MG/50ML-% IV SOLN
20.0000 mg | Freq: Once | INTRAVENOUS | Status: AC
  Administered 2013-01-16: 20 mg via INTRAVENOUS

## 2013-01-16 MED ORDER — PACLITAXEL CHEMO INJECTION 300 MG/50ML
64.0000 mg/m2 | Freq: Once | INTRAVENOUS | Status: AC
  Administered 2013-01-16: 108 mg via INTRAVENOUS
  Filled 2013-01-16: qty 18

## 2013-01-16 MED ORDER — ONDANSETRON 16 MG/50ML IVPB (CHCC)
16.0000 mg | Freq: Once | INTRAVENOUS | Status: AC
  Administered 2013-01-16: 16 mg via INTRAVENOUS

## 2013-01-16 MED ORDER — DIPHENHYDRAMINE HCL 50 MG/ML IJ SOLN
25.0000 mg | Freq: Once | INTRAMUSCULAR | Status: AC
  Administered 2013-01-16: 25 mg via INTRAVENOUS

## 2013-01-16 MED ORDER — SODIUM CHLORIDE 0.9 % IJ SOLN
10.0000 mL | INTRAMUSCULAR | Status: DC | PRN
  Administered 2013-01-16: 10 mL
  Filled 2013-01-16: qty 10

## 2013-01-16 MED ORDER — SODIUM CHLORIDE 0.9 % IV SOLN
Freq: Once | INTRAVENOUS | Status: AC
  Administered 2013-01-16: 11:00:00 via INTRAVENOUS

## 2013-01-16 MED ORDER — HEPARIN SOD (PORK) LOCK FLUSH 100 UNIT/ML IV SOLN
500.0000 [IU] | Freq: Once | INTRAVENOUS | Status: AC | PRN
  Administered 2013-01-16: 500 [IU]
  Filled 2013-01-16: qty 5

## 2013-01-16 MED ORDER — SODIUM CHLORIDE 0.9 % IV SOLN
594.6000 mg | Freq: Once | INTRAVENOUS | Status: AC
  Administered 2013-01-16: 590 mg via INTRAVENOUS
  Filled 2013-01-16: qty 59

## 2013-01-16 NOTE — Patient Instructions (Signed)
[facility] Center Discharge Instructions for Patients Receiving Chemotherapy  Today you received the following chemotherapy agents Taxol/Carboplatin To help prevent nausea and vomiting after your treatment, we encourage you to take your nausea medication as prescribed.  If you develop nausea and vomiting that is not controlled by your nausea medication, call the clinic. If it is after clinic hours your family physician or the after hours number for the clinic or go to the Emergency Department.   BELOW ARE SYMPTOMS THAT SHOULD BE REPORTED IMMEDIATELY:  *FEVER GREATER THAN 100.5 F  *CHILLS WITH OR WITHOUT FEVER  NAUSEA AND VOMITING THAT IS NOT CONTROLLED WITH YOUR NAUSEA MEDICATION  *UNUSUAL SHORTNESS OF BREATH  *UNUSUAL BRUISING OR BLEEDING  TENDERNESS IN MOUTH AND THROAT WITH OR WITHOUT PRESENCE OF ULCERS  *URINARY PROBLEMS  *BOWEL PROBLEMS  UNUSUAL RASH Items with * indicate a potential emergency and should be followed up as soon as possible.  One of the nurses will contact you 24 hours after your treatment. Please let the nurse know about any problems that you may have experienced. Feel free to call the clinic you have any questions or concerns. The clinic phone number is [phone].   I have been informed and understand all the instructions given to me. I know to contact the clinic, my physician, or go to the Emergency Department if any problems should occur. I do not have any questions at this time, but understand that I may call the clinic during office hours   should I have any questions or need assistance in obtaining follow up care.    __________________________________________  _____________  __________ Signature of Patient or Authorized Representative            Date                   Time    __________________________________________ Nurse's Signature    

## 2013-01-22 ENCOUNTER — Ambulatory Visit: Payer: Medicare Other | Admitting: Gynecology

## 2013-01-23 ENCOUNTER — Other Ambulatory Visit (HOSPITAL_BASED_OUTPATIENT_CLINIC_OR_DEPARTMENT_OTHER): Payer: Medicare Other | Admitting: Lab

## 2013-01-23 ENCOUNTER — Ambulatory Visit (HOSPITAL_BASED_OUTPATIENT_CLINIC_OR_DEPARTMENT_OTHER): Payer: Medicare Other

## 2013-01-23 VITALS — BP 115/76 | HR 65 | Temp 97.7°F | Resp 18

## 2013-01-23 DIAGNOSIS — C569 Malignant neoplasm of unspecified ovary: Secondary | ICD-10-CM

## 2013-01-23 DIAGNOSIS — Z5111 Encounter for antineoplastic chemotherapy: Secondary | ICD-10-CM

## 2013-01-23 LAB — CBC WITH DIFFERENTIAL/PLATELET
BASO%: 0.3 % (ref 0.0–2.0)
Basophils Absolute: 0 10*3/uL (ref 0.0–0.1)
EOS%: 1 % (ref 0.0–7.0)
HGB: 10.4 g/dL — ABNORMAL LOW (ref 11.6–15.9)
MCH: 37.4 pg — ABNORMAL HIGH (ref 25.1–34.0)
MCHC: 33.1 g/dL (ref 31.5–36.0)
MCV: 112.9 fL — ABNORMAL HIGH (ref 79.5–101.0)
MONO%: 6.6 % (ref 0.0–14.0)
NEUT%: 53.1 % (ref 38.4–76.8)
RDW: 16.8 % — ABNORMAL HIGH (ref 11.2–14.5)
lymph#: 1.1 10*3/uL (ref 0.9–3.3)

## 2013-01-23 MED ORDER — SODIUM CHLORIDE 0.9 % IV SOLN
Freq: Once | INTRAVENOUS | Status: AC
  Administered 2013-01-23: 11:00:00 via INTRAVENOUS

## 2013-01-23 MED ORDER — DEXAMETHASONE SODIUM PHOSPHATE 10 MG/ML IJ SOLN
5.0000 mg | Freq: Once | INTRAMUSCULAR | Status: AC
  Administered 2013-01-23: 5 mg via INTRAVENOUS

## 2013-01-23 MED ORDER — FAMOTIDINE IN NACL 20-0.9 MG/50ML-% IV SOLN
20.0000 mg | Freq: Once | INTRAVENOUS | Status: AC
  Administered 2013-01-23: 20 mg via INTRAVENOUS

## 2013-01-23 MED ORDER — SODIUM CHLORIDE 0.9 % IJ SOLN
10.0000 mL | INTRAMUSCULAR | Status: DC | PRN
  Filled 2013-01-23: qty 10

## 2013-01-23 MED ORDER — HEPARIN SOD (PORK) LOCK FLUSH 100 UNIT/ML IV SOLN
500.0000 [IU] | Freq: Once | INTRAVENOUS | Status: DC | PRN
  Filled 2013-01-23: qty 5

## 2013-01-23 MED ORDER — ONDANSETRON 8 MG/50ML IVPB (CHCC)
8.0000 mg | Freq: Once | INTRAVENOUS | Status: AC
  Administered 2013-01-23: 8 mg via INTRAVENOUS

## 2013-01-23 MED ORDER — DIPHENHYDRAMINE HCL 50 MG/ML IJ SOLN
25.0000 mg | Freq: Once | INTRAMUSCULAR | Status: AC
  Administered 2013-01-23: 25 mg via INTRAVENOUS

## 2013-01-23 MED ORDER — SODIUM CHLORIDE 0.9 % IV SOLN
64.0000 mg/m2 | Freq: Once | INTRAVENOUS | Status: AC
  Administered 2013-01-23: 108 mg via INTRAVENOUS
  Filled 2013-01-23: qty 18

## 2013-01-23 NOTE — Patient Instructions (Addendum)
Paclitaxel injection  What is this medicine?  PACLITAXEL (PAK li TAX el) is a chemotherapy drug. It targets fast dividing cells, like cancer cells, and causes these cells to die. This medicine is used to treat ovarian cancer, breast cancer, and other cancers.  This medicine may be used for other purposes; ask your health care provider or pharmacist if you have questions.  What should I tell my health care provider before I take this medicine?  They need to know if you have any of these conditions:  -blood disorders  -irregular heartbeat  -infection (especially a virus infection such as chickenpox, cold sores, or herpes)  -liver disease  -previous or ongoing radiation therapy  -an unusual or allergic reaction to paclitaxel, alcohol, polyoxyethylated castor oil, other chemotherapy agents, other medicines, foods, dyes, or preservatives  -pregnant or trying to get pregnant  -breast-feeding  How should I use this medicine?  This drug is given as an infusion into a vein. It is administered in a hospital or clinic by a specially trained health care professional.  Talk to your pediatrician regarding the use of this medicine in children. Special care may be needed.  Overdosage: If you think you have taken too much of this medicine contact a poison control center or emergency room at once.  NOTE: This medicine is only for you. Do not share this medicine with others.  What if I miss a dose?  It is important not to miss your dose. Call your doctor or health care professional if you are unable to keep an appointment.  What may interact with this medicine?  Do not take this medicine with any of the following medications:  -disulfiram  -metronidazole  This medicine may also interact with the following medications:  -cyclosporine  -dexamethasone  -diazepam  -ketoconazole  -medicines to increase blood counts like filgrastim, pegfilgrastim, sargramostim  -other chemotherapy drugs like cisplatin, doxorubicin, epirubicin, etoposide,  teniposide, vincristine  -quinidine  -testosterone  -vaccines  -verapamil  Talk to your doctor or health care professional before taking any of these medicines:  -acetaminophen  -aspirin  -ibuprofen  -ketoprofen  -naproxen  This list may not describe all possible interactions. Give your health care provider a list of all the medicines, herbs, non-prescription drugs, or dietary supplements you use. Also tell them if you smoke, drink alcohol, or use illegal drugs. Some items may interact with your medicine.  What should I watch for while using this medicine?  Your condition will be monitored carefully while you are receiving this medicine. You will need important blood work done while you are taking this medicine.  This drug may make you feel generally unwell. This is not uncommon, as chemotherapy can affect healthy cells as well as cancer cells. Report any side effects. Continue your course of treatment even though you feel ill unless your doctor tells you to stop.  In some cases, you may be given additional medicines to help with side effects. Follow all directions for their use.  Call your doctor or health care professional for advice if you get a fever, chills or sore throat, or other symptoms of a cold or flu. Do not treat yourself. This drug decreases your body's ability to fight infections. Try to avoid being around people who are sick.  This medicine may increase your risk to bruise or bleed. Call your doctor or health care professional if you notice any unusual bleeding.  Be careful brushing and flossing your teeth or using a toothpick because you   may get an infection or bleed more easily. If you have any dental work done, tell your dentist you are receiving this medicine.  Avoid taking products that contain aspirin, acetaminophen, ibuprofen, naproxen, or ketoprofen unless instructed by your doctor. These medicines may hide a fever.  Do not become pregnant while taking this medicine. Women should inform their  doctor if they wish to become pregnant or think they might be pregnant. There is a potential for serious side effects to an unborn child. Talk to your health care professional or pharmacist for more information. Do not breast-feed an infant while taking this medicine.  Men are advised not to father a child while receiving this medicine.  What side effects may I notice from receiving this medicine?  Side effects that you should report to your doctor or health care professional as soon as possible:  -allergic reactions like skin rash, itching or hives, swelling of the face, lips, or tongue  -low blood counts - This drug may decrease the number of white blood cells, red blood cells and platelets. You may be at increased risk for infections and bleeding.  -signs of infection - fever or chills, cough, sore throat, pain or difficulty passing urine  -signs of decreased platelets or bleeding - bruising, pinpoint red spots on the skin, black, tarry stools, nosebleeds  -signs of decreased red blood cells - unusually weak or tired, fainting spells, lightheadedness  -breathing problems  -chest pain  -high or low blood pressure  -mouth sores  -nausea and vomiting  -pain, swelling, redness or irritation at the injection site  -pain, tingling, numbness in the hands or feet  -slow or irregular heartbeat  -swelling of the ankle, feet, hands  Side effects that usually do not require medical attention (report to your doctor or health care professional if they continue or are bothersome):  -bone pain  -complete hair loss including hair on your head, underarms, pubic hair, eyebrows, and eyelashes  -changes in the color of fingernails  -diarrhea  -loosening of the fingernails  -loss of appetite  -muscle or joint pain  -red flush to skin  -sweating  This list may not describe all possible side effects. Call your doctor for medical advice about side effects. You may report side effects to FDA at 1-800-FDA-1088.  Where should I keep my  medicine?  This drug is given in a hospital or clinic and will not be stored at home.  NOTE: This sheet is a summary. It may not cover all possible information. If you have questions about this medicine, talk to your doctor, pharmacist, or health care provider.   2013, [employer]. (09/02/2008 11:54:26 AM)

## 2013-01-24 ENCOUNTER — Ambulatory Visit: Payer: Medicare Other | Admitting: Pulmonary Disease

## 2013-01-28 ENCOUNTER — Other Ambulatory Visit: Payer: Self-pay | Admitting: Oncology

## 2013-01-30 ENCOUNTER — Telehealth: Payer: Self-pay | Admitting: Oncology

## 2013-01-30 ENCOUNTER — Other Ambulatory Visit (HOSPITAL_BASED_OUTPATIENT_CLINIC_OR_DEPARTMENT_OTHER): Payer: Medicare Other | Admitting: Lab

## 2013-01-30 ENCOUNTER — Ambulatory Visit (HOSPITAL_BASED_OUTPATIENT_CLINIC_OR_DEPARTMENT_OTHER): Payer: Medicare Other | Admitting: Nurse Practitioner

## 2013-01-30 ENCOUNTER — Telehealth: Payer: Self-pay | Admitting: Pulmonary Disease

## 2013-01-30 ENCOUNTER — Ambulatory Visit: Payer: Medicare Other

## 2013-01-30 VITALS — BP 115/70 | HR 82 | Temp 98.3°F | Resp 18 | Ht 65.5 in | Wt 141.7 lb

## 2013-01-30 DIAGNOSIS — F411 Generalized anxiety disorder: Secondary | ICD-10-CM

## 2013-01-30 DIAGNOSIS — D702 Other drug-induced agranulocytosis: Secondary | ICD-10-CM

## 2013-01-30 DIAGNOSIS — C569 Malignant neoplasm of unspecified ovary: Secondary | ICD-10-CM

## 2013-01-30 LAB — CBC WITH DIFFERENTIAL/PLATELET
BASO%: 0 % (ref 0.0–2.0)
EOS%: 0 % (ref 0.0–7.0)
HCT: 29.6 % — ABNORMAL LOW (ref 34.8–46.6)
LYMPH%: 46.1 % (ref 14.0–49.7)
MCH: 37.3 pg — ABNORMAL HIGH (ref 25.1–34.0)
MCHC: 32.1 g/dL (ref 31.5–36.0)
MCV: 116.1 fL — ABNORMAL HIGH (ref 79.5–101.0)
MONO%: 11.7 % (ref 0.0–14.0)
NEUT%: 42.2 % (ref 38.4–76.8)
Platelets: 117 10*3/uL — ABNORMAL LOW (ref 145–400)

## 2013-01-30 NOTE — Progress Notes (Signed)
OFFICE PROGRESS NOTE  Interval history:  [name] [name] returns as scheduled. She began cycle 6 Taxol/carboplatin on 01/16/2013. She denies nausea/vomiting. No mouth sores. No diarrhea. She gives a history of chronic constipation. The constipation has worsened since beginning the chemotherapy. She takes MiraLAX as needed. She has occasional mild numbness at the lateral edges of the pinky fingers and feet. She continues to have intermittent pain at the right lower quadrant.   Objective: Blood pressure 115/70, pulse 82, temperature 98.3 F (36.8 C), temperature source Oral, resp. rate 18, height 5' 5.5" (1.664 m), weight 141 lb 11.2 oz (64.275 kg).  No thrush or ulceration. Lungs are clear. Regular cardiac rhythm. Port-A-Cath site without erythema. Abdomen is soft and nontender. No hepatomegaly. No mass. Extremities are without edema.  Lab Results: Lab Results  Component Value Date   WBC 1.8* 01/30/2013   HGB 9.5* 01/30/2013   HCT 29.6* 01/30/2013   MCV 116.1* 01/30/2013   PLT 117* 01/30/2013    Chemistry:    Chemistry      Component Value Date/Time   NA 139 01/16/2013 1031   NA 139 01/02/2013 0821   K 4.1 01/16/2013 1031   K 4.0 01/02/2013 0821   CL 103 01/16/2013 1031   CL 104 01/02/2013 0821   CO2 29 01/16/2013 1031   CO2 26 01/02/2013 0821   BUN 20 01/16/2013 1031   BUN 21.3 01/02/2013 0821   CREATININE 0.63 01/16/2013 1031   CREATININE 0.7 01/02/2013 0821      Component Value Date/Time   CALCIUM 9.3 01/16/2013 1031   CALCIUM 9.4 01/02/2013 0821   ALKPHOS 57 01/16/2013 1031   ALKPHOS 58 01/02/2013 0821   AST 20 01/16/2013 1031   AST 23 01/02/2013 0821   ALT 23 01/16/2013 1031   ALT 35 01/02/2013 0821   BILITOT 0.4 01/16/2013 1031   BILITOT 0.36 01/02/2013 0821       Studies/Results: No results found.  Medications: I have reviewed the patient's current medications.  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 A 5 mm nodules remain on the right diaphragm. -Cycle 1 of adjuvant Taxol/carboplatin chemotherapy initiated on 09/19/2012 , the CA 125 has normalized.  2. Low abdomen/suprapubic pain prior to the exploratory laparotomy-likely secondary to omental/pelvic tumor , persistent mild pain in the right low abdomen-likely postoperative pain, she plans to schedule a GYN oncology evaluation.  3. Chronic neck and back pain  4. Anxiety -persistent despite Lexapro and Xanax, now taking Valium. She has been evaluated by psychiatry.  5. Status post Port-A-Cath placement 09/22/2012.  6. Neutropenia secondary to chemotherapy- day 15 cycle 1 and cycle 3 Taxol/carboplatin not given secondary to neutropenia. She is neutropenic today.  Disposition-[name] appears stable. She is neutropenic on labs today. We are holding today's treatment (cycle 6 day 15) and rescheduling for one week which will complete the course of chemotherapy. We will followup on the CA 125 from today. She will return for a followup visit and CA 125 in 8 weeks. She will contact the office in the interim with any problems.  Plan reviewed with Dr. Truett Perna.    Lonna Cobb ANP/GNP-BC

## 2013-01-30 NOTE — Telephone Encounter (Signed)
Called and spoke with pt and she stated that she will keep the appt in June with SN for her CPX and fasting labs. Nothing further is needed.

## 2013-02-02 ENCOUNTER — Telehealth: Payer: Self-pay | Admitting: *Deleted

## 2013-02-02 ENCOUNTER — Encounter: Payer: Self-pay | Admitting: *Deleted

## 2013-02-02 NOTE — Telephone Encounter (Signed)
Message copied by Wandalee Ferdinand on Fri Feb 02, 2013  5:38 PM ------      Message from: Thornton Papas B      Created: Wed Jan 31, 2013  8:36 PM       Please call patient, ca125 is normal ------

## 2013-02-02 NOTE — Telephone Encounter (Signed)
Notified of normal Ca 125

## 2013-02-06 ENCOUNTER — Other Ambulatory Visit: Payer: Self-pay | Admitting: Nurse Practitioner

## 2013-02-06 ENCOUNTER — Ambulatory Visit (HOSPITAL_BASED_OUTPATIENT_CLINIC_OR_DEPARTMENT_OTHER): Payer: Medicare Other

## 2013-02-06 ENCOUNTER — Other Ambulatory Visit (HOSPITAL_BASED_OUTPATIENT_CLINIC_OR_DEPARTMENT_OTHER): Payer: Medicare Other

## 2013-02-06 VITALS — BP 118/77 | HR 60 | Temp 97.1°F

## 2013-02-06 DIAGNOSIS — C569 Malignant neoplasm of unspecified ovary: Secondary | ICD-10-CM

## 2013-02-06 DIAGNOSIS — Z5111 Encounter for antineoplastic chemotherapy: Secondary | ICD-10-CM

## 2013-02-06 LAB — CBC WITH DIFFERENTIAL/PLATELET
Basophils Absolute: 0 10*3/uL (ref 0.0–0.1)
Eosinophils Absolute: 0 10*3/uL (ref 0.0–0.5)
HCT: 35 % (ref 34.8–46.6)
HGB: 11.3 g/dL — ABNORMAL LOW (ref 11.6–15.9)
LYMPH%: 33.2 % (ref 14.0–49.7)
MCV: 116.7 fL — ABNORMAL HIGH (ref 79.5–101.0)
MONO#: 0.5 10*3/uL (ref 0.1–0.9)
MONO%: 20.8 % — ABNORMAL HIGH (ref 0.0–14.0)
NEUT#: 1.2 10*3/uL — ABNORMAL LOW (ref 1.5–6.5)
Platelets: 88 10*3/uL — ABNORMAL LOW (ref 145–400)
RBC: 3 10*6/uL — ABNORMAL LOW (ref 3.70–5.45)
WBC: 2.6 10*3/uL — ABNORMAL LOW (ref 3.9–10.3)
nRBC: 0 % (ref 0–0)

## 2013-02-06 MED ORDER — DIPHENHYDRAMINE HCL 50 MG/ML IJ SOLN
25.0000 mg | Freq: Once | INTRAMUSCULAR | Status: AC
  Administered 2013-02-06: 25 mg via INTRAVENOUS

## 2013-02-06 MED ORDER — PACLITAXEL CHEMO INJECTION 300 MG/50ML: 64.0000 mg/m2 | Freq: Once | INTRAVENOUS | Status: DC

## 2013-02-06 MED ORDER — DEXAMETHASONE SODIUM PHOSPHATE 10 MG/ML IJ SOLN
5.0000 mg | Freq: Once | INTRAMUSCULAR | Status: AC
  Administered 2013-02-06: 5 mg via INTRAVENOUS

## 2013-02-06 MED ORDER — ONDANSETRON 8 MG/50ML IVPB (CHCC)
8.0000 mg | Freq: Once | INTRAVENOUS | Status: AC
  Administered 2013-02-06: 8 mg via INTRAVENOUS

## 2013-02-06 MED ORDER — HEPARIN SOD (PORK) LOCK FLUSH 100 UNIT/ML IV SOLN
500.0000 [IU] | Freq: Once | INTRAVENOUS | Status: AC | PRN
  Administered 2013-02-06: 500 [IU]
  Filled 2013-02-06: qty 5

## 2013-02-06 MED ORDER — SODIUM CHLORIDE 0.9 % IV SOLN
108.0000 mg | Freq: Once | INTRAVENOUS | Status: AC
  Administered 2013-02-06: 108 mg via INTRAVENOUS
  Filled 2013-02-06: qty 18

## 2013-02-06 MED ORDER — SODIUM CHLORIDE 0.9 % IV SOLN
Freq: Once | INTRAVENOUS | Status: AC
  Administered 2013-02-06: 10:00:00 via INTRAVENOUS

## 2013-02-06 MED ORDER — FAMOTIDINE IN NACL 20-0.9 MG/50ML-% IV SOLN
20.0000 mg | Freq: Once | INTRAVENOUS | Status: AC
  Administered 2013-02-06: 20 mg via INTRAVENOUS

## 2013-02-06 MED ORDER — SODIUM CHLORIDE 0.9 % IJ SOLN
10.0000 mL | INTRAMUSCULAR | Status: DC | PRN
  Administered 2013-02-06: 10 mL
  Filled 2013-02-06: qty 10

## 2013-02-06 NOTE — Progress Notes (Signed)
Per Misty Stanley, NP- ok to treat pt despite platelets 88.

## 2013-02-06 NOTE — Patient Instructions (Addendum)
[facility] Discharge Instructions for Patients Receiving Chemotherapy  Today you received the following chemotherapy agents: Taxotere, Cytoxan.  To help prevent nausea and vomiting after your treatment, we encourage you to take your nausea medication.    If you develop nausea and vomiting that is not controlled by your nausea medication, call the clinic.    BELOW ARE SYMPTOMS THAT SHOULD BE REPORTED IMMEDIATELY:  *FEVER GREATER THAN 100.5 F  *CHILLS WITH OR WITHOUT FEVER  NAUSEA AND VOMITING THAT IS NOT CONTROLLED WITH YOUR NAUSEA MEDICATION  *UNUSUAL SHORTNESS OF BREATH  *UNUSUAL BRUISING OR BLEEDING  TENDERNESS IN MOUTH AND THROAT WITH OR WITHOUT PRESENCE OF ULCERS  *URINARY PROBLEMS  *BOWEL PROBLEMS  UNUSUAL RASH Items with * indicate a potential emergency and should be followed up as soon as possible.  The clinic phone number is [phone].

## 2013-02-12 ENCOUNTER — Other Ambulatory Visit: Payer: Self-pay | Admitting: Pulmonary Disease

## 2013-02-13 ENCOUNTER — Other Ambulatory Visit (HOSPITAL_BASED_OUTPATIENT_CLINIC_OR_DEPARTMENT_OTHER): Payer: Medicare Other | Admitting: Lab

## 2013-02-13 DIAGNOSIS — C569 Malignant neoplasm of unspecified ovary: Secondary | ICD-10-CM

## 2013-02-13 LAB — CBC WITH DIFFERENTIAL/PLATELET
Basophils Absolute: 0 10*3/uL (ref 0.0–0.1)
EOS%: 0.6 % (ref 0.0–7.0)
HCT: 32.4 % — ABNORMAL LOW (ref 34.8–46.6)
HGB: 10.6 g/dL — ABNORMAL LOW (ref 11.6–15.9)
MCH: 37.7 pg — ABNORMAL HIGH (ref 25.1–34.0)
MCHC: 32.7 g/dL (ref 31.5–36.0)
MCV: 115.3 fL — ABNORMAL HIGH (ref 79.5–101.0)
MONO%: 8 % (ref 0.0–14.0)
NEUT%: 60.4 % (ref 38.4–76.8)
RDW: 15 % — ABNORMAL HIGH (ref 11.2–14.5)

## 2013-02-15 ENCOUNTER — Other Ambulatory Visit: Payer: Self-pay | Admitting: Pulmonary Disease

## 2013-03-05 ENCOUNTER — Telehealth: Payer: Self-pay | Admitting: Pulmonary Disease

## 2013-03-05 DIAGNOSIS — E7800 Pure hypercholesterolemia, unspecified: Secondary | ICD-10-CM

## 2013-03-05 DIAGNOSIS — M549 Dorsalgia, unspecified: Secondary | ICD-10-CM

## 2013-03-05 DIAGNOSIS — E559 Vitamin D deficiency, unspecified: Secondary | ICD-10-CM

## 2013-03-05 DIAGNOSIS — F411 Generalized anxiety disorder: Secondary | ICD-10-CM

## 2013-03-05 NOTE — Telephone Encounter (Signed)
Attempted to call pt but no machine.  Will try back later.  

## 2013-03-05 NOTE — Telephone Encounter (Signed)
Pt called back and requested that a UA be added to her labs for tomorrow. This has been done and nothing further is needed

## 2013-03-05 NOTE — Telephone Encounter (Signed)
Please advise what labs pt will need to have done thanks SN

## 2013-03-05 NOTE — Telephone Encounter (Signed)
Per SN---  Lip, vit d, tsh  Called and spoke with pt and she is aware of lab orders placed.  She will come by in the morning to have these done. Nothing further is needed.

## 2013-03-06 ENCOUNTER — Other Ambulatory Visit (INDEPENDENT_AMBULATORY_CARE_PROVIDER_SITE_OTHER): Payer: Medicare Other

## 2013-03-06 DIAGNOSIS — F411 Generalized anxiety disorder: Secondary | ICD-10-CM

## 2013-03-06 DIAGNOSIS — E7800 Pure hypercholesterolemia, unspecified: Secondary | ICD-10-CM

## 2013-03-06 DIAGNOSIS — M549 Dorsalgia, unspecified: Secondary | ICD-10-CM

## 2013-03-06 DIAGNOSIS — E559 Vitamin D deficiency, unspecified: Secondary | ICD-10-CM

## 2013-03-06 LAB — URINALYSIS, ROUTINE W REFLEX MICROSCOPIC
Bilirubin Urine: NEGATIVE
Ketones, ur: NEGATIVE
RBC / HPF: NONE SEEN (ref 0–?)
Specific Gravity, Urine: 1.02 (ref 1.000–1.030)
Urine Glucose: NEGATIVE
Urobilinogen, UA: 0.2 (ref 0.0–1.0)

## 2013-03-06 LAB — LIPID PANEL: Total CHOL/HDL Ratio: 4

## 2013-03-06 LAB — TSH: TSH: 2.76 u[IU]/mL (ref 0.35–5.50)

## 2013-03-06 LAB — LDL CHOLESTEROL, DIRECT: Direct LDL: 133.8 mg/dL

## 2013-03-07 ENCOUNTER — Ambulatory Visit (INDEPENDENT_AMBULATORY_CARE_PROVIDER_SITE_OTHER): Payer: Medicare Other | Admitting: Pulmonary Disease

## 2013-03-07 ENCOUNTER — Encounter: Payer: Self-pay | Admitting: Pulmonary Disease

## 2013-03-07 VITALS — BP 116/76 | HR 80 | Temp 98.8°F | Ht 66.0 in | Wt 139.8 lb

## 2013-03-07 DIAGNOSIS — F329 Major depressive disorder, single episode, unspecified: Secondary | ICD-10-CM

## 2013-03-07 DIAGNOSIS — F32A Depression, unspecified: Secondary | ICD-10-CM

## 2013-03-07 DIAGNOSIS — C569 Malignant neoplasm of unspecified ovary: Secondary | ICD-10-CM

## 2013-03-07 DIAGNOSIS — F411 Generalized anxiety disorder: Secondary | ICD-10-CM

## 2013-03-07 DIAGNOSIS — Z8679 Personal history of other diseases of the circulatory system: Secondary | ICD-10-CM

## 2013-03-07 DIAGNOSIS — E7800 Pure hypercholesterolemia, unspecified: Secondary | ICD-10-CM

## 2013-03-07 DIAGNOSIS — M1990 Unspecified osteoarthritis, unspecified site: Secondary | ICD-10-CM

## 2013-03-07 DIAGNOSIS — Z9189 Other specified personal risk factors, not elsewhere classified: Secondary | ICD-10-CM

## 2013-03-07 DIAGNOSIS — Z8719 Personal history of other diseases of the digestive system: Secondary | ICD-10-CM

## 2013-03-07 DIAGNOSIS — E559 Vitamin D deficiency, unspecified: Secondary | ICD-10-CM

## 2013-03-07 NOTE — Progress Notes (Signed)
Subjective:    Patient ID: [name] [name], female    DOB: 1951/01/27, 62 y.o.   MRN: [id number]  HPI 62 y/o WF here for a follow up visit...    ~  August 19, 2011:  6wk ROV and add-on for 3 problems: 1) she's noted incr palpit & SOB since she's stopped her Klonopin; palpit are like a racing that occurs on & off esp if anxious or at bedtime when quiet; also notes the SOB- "like I can't get a deep breath" at night while quiet & trying to sleep; we discussed resuming the KLONOPIN 0.5mg  Bid regularly, avoid caffeine etc...  2) c/o heel pain esp on left foot, c/w achilles tendonitis; rec hot soaks, & NSAID Rx w/ Advil/ Aleve/ etc, & stretching exercise; next step = Podiatry if nec.Marland KitchenMarland Kitchen  3) wants f/u FLP now on Prav40 x 6wks (improved- not yet to goal)...  See prob list below:  ~  December 29, 2101:  4-50mo ROV & she appears quite anxious, rapid speech & somewhat loud; encouraged to take the Klonopin Bid regularly; she is concerned about her great toe nail- mycotic infection & offered Lamisil but she thinks it needs to come off & we will refer to Podiatry;  Also notes freq urination & wants Urinalysis & cult today...    MVP>  She notes occas self limited palpit on & off; denies CP/ angina etc; recall hx mother died suddenly in her 23's w/ MI; we gave her a lot of reassurance & reviewed risk factor reduction strategy; avoid caffeine & take the Klonopin!    Chol>  On Prav40 now & followed in the [facility]; FLP stable on this w/ [id number] & [id number]; she is also taking Fish Oil & Flax Seed Oil per her preference; I told her she may need a stonger statin if Prav40 + DIET don't get her closer to the goals...    IBS>  She denies abd pain, n/v/d/c/blood in stools etc; states she's doing satis w/o GI issues at present...    DJD/ neck pain, back pain>  She continues her f/u w/ DrAnderson for Rheum (she is on disability due to her arthritis per Rheum) & DrPool for NS; they manage her chronic pain as well & just on Naprosyn 220mg  Bid  as needed now; vs OTC analgesics, plus her Oscal, etc...    Vit D defic>  On Vit D 5000u daily supplement but recently ran out; Vit D level is still on the low side at 35 today- rec continuing VitD 5000u OTC daily supplement...    Anxiety>  She still appears quite anxious; supposed to be taking Klonopin 0.5mg  Bid regularly for the palpit/ SOB/ etc; asked her to take Bid regularly so we could assess it's efficacy & see what her best dose will be...    DERM> onychomycosis would benefit from Lamisil in my opinion but she wonders if the nail needs to [facility]; we will send her to Podiatry for their review... CXR 3/13 showed stable heart size, clear lungs, DJD in spine w/ neck hardware... LABS 3/13 showed:  FLP- not at goals on Prav40;  Chems- wnl;  CBC- wnl;  TSH=1.77;  VitD=35;  UA- clear x few wbc & awaiting C&S...  ~  July 05, 2012:  46mo ROV & [name] [name] is c/o 16mo hx lower abd pain> off & on, noted worse if getting up or turning a certain way, noted it worse over bladder & saw GYN w/ [facility] showing fibroid but no acute changes &  they want her to see GI, DrStark;  She notes some constip- hard, every 3-4d, not painful/ no blood;  uses Metamucil as needed;  We discussed further eval w/ CT Abd&Pelvis, treat w/ BENTYL 20mg  prn cramping, & refer to DrStark for colonoscopy...    Since last OV she has decided to stop the Prav40 & is using RYR instead "some woman told me about it"; she is not fasting for FLP today...     Similarly she has stopped her Klonopin "I don't need it, no problems!"... We reviewed prob list, meds, xrays and labs> see below for updates >> OK Flu vaccine today. LABS 10/13:  Chems- wnl;  CBC- wnl;  Sed- 17... ADDENDUM>> CT Abd&Pelvis 07/12/12>> signif soft tissue caking throughout the omentum, min pelvic ascites, no adnexal mass or primary tumor identified, sm fibroids... This likely represents ovarian ca vs primary peritoneal carcinomatous> we will check Ca125, Ca19-9, & arrange for Bx by IR  along w/ referral to Oncology...    ~  March 07, 2013:  49mo ROV & since she had her diagnosis of Ovarian Ca- stage lllc she has had Surg by [facility] 11/13 (optimum debulking surg required a rectosigmoid resection & total omentectomy, TAH, BSO) and ChemoRx from DrSherrill (w/ Carboplatin & Taxol) started 12/13 until 5/14 (her Ca125 has returned to normal 69=> 4.3=> 5.6... She has done well w/ the therapy but has had trouble w/ depression & her Psychiatrist- DrPoulas has adjusted her Lexapro up to 20mg /d... We reviewed the following medical problems during today's office visit >>     MVP> notes occas self limited palpit on & off; denies CP/ angina etc; recall hx mother died suddenly in her 53's w/ MI; we gave her a lot of reassurance & reviewed risk factor reduction strategy; avoid caffeine & take meds from DrPoulas...    Chol> on Prav40; FLP 6/14 shows TChol 206, TG 136, HDL 48, LDL 134... rec to continue same med for now...    IBS> on Miralax, Colase, Compazine; she had rectosigmoid resection as part of her Ovarian Ca surg; improved w/ occas discomfort & min constip- the Miralax helps...     Ovarian cancer w/ mets, stage IIIc> SEE ABOVE    DJD/ neck pain, back pain> followed by [place] Pellant for Rheum (she is on disability from him) & DrPool for NS; they manage her chronic pain as well & taking Percocet5 prn...    Vit D defic> on Vit D 5000u daily supplement but recently ran out; Vit D level is still on the low side at 35- rec continuing VitD 5000u OTC daily supplement...    Anxiety> on Valium5 & Lexapro20; she has re-established w/ drPoulas for Psyche & getting counseling as well...    DERM> onychomycosis evaluated by Podiatry... We reviewed prob list, meds, xrays and labs> see below for updates >>  LABS 6/14:  FLP- improved on Prav40 but LDL=134; TSH=2.76;  VitD=36... LABS 4/14 in Epic> reviewed:  Chems- wnl;  CBC- anemic w/ Hg=10.6, MCV=115, WBC=3.4;  Ca125=5.6 CXR 11/13 showed normal heart size,  clear lungs, prior Cspine fusion...           Problem List:  CHEST WALL PAIN, HX OF (ICD-V15.89),  MITRAL VALVE PROLAPSE, HX OF (ICD-V12.50) -  prev cardiac eval by DrNishan in 2005 due to coronary risk factors: pt's mother died suddenly in her 80's w/ MI... denies CP, palipit, dizziness, syncope, dyspnea, edema, etc...  ~  2DEcho 12/05 showed normal x ?HK inferiorly, EF= 55-60%... ~  NuclearStressTest  12/05 showed normal- no ischemia, no scar, EF= 65%... no change from 2003. ~  EKG= NSR, WNL.[name]. ~  CXR 9/10 & 9/11 showed NAD.[name]. ~  CXR 3/13 showed stable heart size, clear lungs, DJD in spine w/ neck hardware...  ~  CXR 11/13 showed normal heart size, clear lungs, prior Cspine fusion  HYPERCHOLESTEROLEMIA (ICD-272.0) - prev followed in the lipid clinic... she was on Lip20 but stopped ~6/11 due to leg cramps. ~  FLP 8/08 showed TChol 224, TG 69, HDL 54, LDL 162... this was off meds- statin restarted. ~  FLP 8/09 on Lip20 showed TChol 195, TG 100, HDL 65, LDL 110... improved- needs better diet. ~  FLP 9/10 on Lip20 showed TChol 198, TG 104, HDL 61, LDL 116... she stopped ~6/11 due to leg cramps. ~  FLP 9/11 on diet alone showed TChol 334, TG 170, HDL 60, LDL 238... needs ret to [facility]id[facility]inic. ~  FLP 11/11 on Prav20 showed TChol 212, TG 111, HDL 54, LDL 129 ~  FLP 3/12 on Prav20 showed TChol 222, TG 126, HDL 57, LDL 153 ~  FLP 9/12 on Prav20 showed TChol 266, TG 138, HDL 62, LDL 184... Needs to incr Prav40 & f/u LC. ~  FLP 11/12 on Prav40 showed TChol 210, TG 136, HDL 57, LDL 141... She tells me that the [facility] told her to f/u here; Continue Prav40 & diet Rx. ~  FLP 3/13 on Prav40 showed TChol 217, TG 143, HDL 62, LDL 143... Told she may need stronger statin if Prav40 + diet don't work better... ~  10/13:  She reports stopping the Prav40 & has started RYR- "some woman told me about it"... ~  FLP 6/14 on Prav40 showed TChol 206, TG 136, HDL 48, LDL 134... Continue same for now.  IRRITABLE BOWEL  SYNDROME, HX OF (ICD-V12.79) - last colonoscopy 7/05 by DrStark was WNL- no divertics, no polyps, etc... f/u planned 56yrs... ~  10/13: she presented w/ lower abd discomfort > see above & we decided to check CT Abd&Pelvis- pending. ~  11/13: she had ELap & surg for Ovarian Ca- optimal debulking, TAH, BSO, & required rectosigmoid resection...  OVARIAN CANCER >>  ~  CT Abd&Pelvis 07/12/12>> signif soft tissue caking throughout the omentum, min pelvic ascites, no adnexal mass or primary tumor identified, sm fibroids... This likely represents ovarian ca vs primary peritoneal carcinomatous> we will check Ca125, Ca19-9, & arrange for Bx by IR along w/ referral to Oncology. ~  Ovarian Ca- stage lllc she has had Surg by [facility] 11/13 (optimum debulking surg required a rectosigmoid resection & total omentectomy, TAH, BSO) and ChemoRx from DrSherrill (w/ Carboplatin & Taxol) started 12/13 until 5/14 (her Ca125 has returned to normal 69=> 4.3=> 5.6.Marland KitchenMarland Kitchen  DEGENERATIVE JOINT DISEASE (ICD-715.90),  NECK PAIN (ICD-723.1),  BACK PAIN (ICD-724.5) >>  She has a severe prob w/ neck pain and LBP... s/p MRI scans and prev Lumbar Lam in 2006 by DrPool... f/u myelogram and CT w/ bone spurs in neck and arthritis in her lower back... s/p ant cerv discectomy & fusion C6-7 for stenosis w/ myelopathy per DrPool... prev followed by DrBarker in Pain Management but now relying on DrAnderson & DrPool... she also sees DrKuzma for Ortho w/ severe DJD of hands, Rx w/ Dosepak and Voltaren- min benefit & he has rec surgery but she is holding off & seeing DrAnderson for Rheum as noted... ~  9/11:  she notes now on disability per [facility] who continues to follow her regularly. ~  9/12:  She maintains f/u w/ DrAnderson for Rheum & DrPool for Neurosurg; she indicates recent MRI spine done, we don't have recent notes...  VITAMIN D DEFICIENCY (ICD-268.9) - Vit D level 8/09 was 14 & she was started on 50,000 u caps weekly, then switched  to 5000 u daily on her own...   ~  labs 9/10 showed Vit D level = 35... OK to continue the 5000 u daily for now. ~  labs 9/11 showed Vit D level = 39... rec to continue 5000 u daily... ~  Labs 3/13 showed Vit D level = 35... States she recently ran out of Vit D; rec to continue 5000u daily...  ANXIETY DISORDER (ICD-300.00) - prev seen by psyche DrLisaPoulus & was on Celexa & Prosom... not currently on medications for nerves... ~  3/12:  We tried to add KLONOPIN 0.5mg  Bid but she never took it... ~  11/12:  Pt c/o incr palpit esp when quiet at night; rec to restart KLONOPIN 0.5mg  Bid regularly... ~  3/13:  rec to take Klonopin 0.5mg  Bid regularly or consider re-establish w/ LisaPoulis... ~  10/13: she stopped the Klonopin & never re-established w/ LisaPoulis... ~  6/14: she has re-established w/ Lindaann Slough 7 on Valium5 7 Lexapro20 now...  Health Maintenance - GYN= DrGottsegen w/ f/u PAP & Mammogram due now... she had prev BMD ?where... she was laid off work at [employer] where she had been for 21yrs, & worked for [employer] in [employer] for 25yrs, now on disability due to her arthritis...   Past Surgical History  Procedure Laterality Date  . Hand surgery  1996  . Tubal ligation  1980  . Hysteroscopy  2004    D&C  . Dilation and curettage of uterus  2004    WITH HYSTEROSCOPY  . Neck surgery  2009    SPURS  . Cyst on neck  2011  . Abdominal hysterectomy  08/22/2012    Procedure: HYSTERECTOMY ABDOMINAL;  Surgeon: Jeannette Corpus, MD;  Location: [facility];  Service: Gynecology;  Laterality: N/A;  . Salpingoophorectomy  08/22/2012    Procedure: SALPINGO OOPHORECTOMY;  Surgeon: Jeannette Corpus, MD;  Location: [facility];  Service: Gynecology;  Laterality: Bilateral;  . Omentectomy  08/22/2012    Procedure: OMENTECTOMY;  Surgeon: Jeannette Corpus, MD;  Location: [facility];  Service: Gynecology;  Laterality: N/A;  . Debulking  08/22/2012    Procedure: DEBULKING;  Surgeon:  Jeannette Corpus, MD;  Location: [facility];  Service: Gynecology;  Laterality: N/A;  Radical tumor debulking, Bilateral Ureterolysis  . Colostomy revision  08/22/2012    Procedure: COLON RESECTION SIGMOID;  Surgeon: Jeannette Corpus, MD;  Location: [facility];  Service: Gynecology;;  Rectal Sigmoid resection and low rectal anastomosis     Outpatient Encounter Prescriptions as of 03/07/2013  Medication Sig Dispense Refill  . calcium-vitamin D (OSCAL WITH D) 500-200 MG-UNIT per tablet Take 1 tablet by mouth daily.        . diazepam (VALIUM) 5 MG tablet Take 1/2 tablet at 2:30 pm and #1 tablet at bedtime Take #2 at bedtime night of chemo treatment      . docusate sodium (COLACE) 100 MG capsule Take 100 mg by mouth daily.      [name] escitalopram (LEXAPRO) 20 MG tablet Take 20 mg by mouth daily.      . fish oil-omega-3 fatty acids 1000 MG capsule Take 1 g by mouth daily.        . Flaxseed, Linseed, (  FLAX SEED OIL) 1000 MG CAPS Take 1 capsule by mouth daily.      [name] lidocaine-prilocaine (EMLA) cream Apply topically as needed. Apply to [facility] site 2 hours prior to stick and cover with plastic wrap to numb site  30 g  prn  . Multiple Vitamin (MULTIVITAMIN WITH MINERALS) TABS Take 1 tablet by mouth daily.      [name] oxyCODONE-acetaminophen (PERCOCET/ROXICET) 5-325 MG per tablet Take 1-2 tablets by mouth every 6 (six) hours as needed (moderate to severe pain (when tolerating fluids)).  30 tablet  0  . polyethylene glycol (MIRALAX / GLYCOLAX) packet Take 17 g by mouth daily as needed.      . pravastatin (PRAVACHOL) 40 MG tablet TAKE ONE TABLET BY MOUTH EVERY DAY  90 tablet  0  . prochlorperazine (COMPAZINE) 10 MG tablet Take 1 tablet (10 mg total) by mouth every 6 (six) hours as needed.  60 tablet  2  . [DISCONTINUED] escitalopram (LEXAPRO) 10 MG tablet Take 15 mg by mouth daily. In morning      . [DISCONTINUED] pravastatin (PRAVACHOL) 40 MG tablet Take 1 tablet (40 mg total) by mouth daily.  90 tablet  1  .  [DISCONTINUED] pravastatin (PRAVACHOL) 40 MG tablet TAKE ONE TABLET BY MOUTH EVERY DAY  90 tablet  0   Facility-Administered Encounter Medications as of 03/07/2013  Medication Dose Route Frequency Provider Last Rate Last Dose  . sodium chloride 0.9 % injection 10 mL  10 mL Intracatheter PRN [name], MD   10 mL at 01/16/13 1359    Allergies  Allergen Reactions  . Atorvastatin     REACTION: causes leg pain  . Decongest-Aid (Pseudoephedrine)     Makes my heart flutter  . Macrodantin     Current Medications, Allergies, Past Medical History, Past Surgical History, Family History, and Social History were reviewed in [employer] record.   Review of Systems         See HPI - all other systems neg except as noted... The patient complains of difficulty walking.  The patient denies anorexia, fever, weight loss, weight gain, vision loss, decreased hearing, hoarseness, chest pain, syncope, dyspnea on exertion, peripheral edema, prolonged cough, headaches, hemoptysis, abdominal pain, melena, hematochezia, severe indigestion/heartburn, hematuria, incontinence, muscle weakness, suspicious skin lesions, transient blindness, depression, unusual weight change, abnormal bleeding, enlarged lymph nodes, and angioedema.     Objective:   Physical Exam     WD, WN, 62 y/o WF in NAD... GENERAL:  Alert & oriented; pleasant & cooperative... HEENT:  [place]/AT, EOM-wnl, PERRLA, EACs-clear, TMs-wnl, NOSE-clear, THROAT-clear & wnl. NECK:  Supple w/ decrROM; scar of ant cerv discectomy; no JVD; normal carotid impulses w/o bruits;  no thyromegaly or nodules palpated; no lymphadenopathy. CHEST:  Clear to P & A; without wheezes/ rales/ or rhonchi. HEART:  Regular Rhythm; without murmurs/ rubs/ or gallops. ABDOMEN:  Soft & nontender; normal bowel sounds; no organomegaly or masses detected. EXT: +heberdens & bouchards nodes;  no varicose veins/ venous insuffic/ or edema. NEURO:  CN's intact;  motor testing normal; sensory testing normal; gait normal & balance OK. DERM:  No lesions noted; no rash etc...  RADIOLOGY DATA:  Reviewed in the EPIC EMR & discussed w/ the patient...  LABORATORY DATA:  Reviewed in the EPIC EMR & discussed w/ the patient...   Assessment & Plan:    OVARIAN CANCER> s/p Surg & ChemoRx as above...    MVP/ Palpit>  She notes occas self limited palpit on &  off; denies CP/ angina etc...     Chol>  On Prav40 & labs improved, s/p chemoRx etc...     IBS>  Symptoms improved; she required rectosigmoid resection as part of her Ovarian Ca surg 11/13...     DJD/ neck pain, back pain>  She continues her f/u w/ DrAnderson for Rheum & DrPool for NS; they manage her chronic pain as well & just on Naprosyn 220mg  Bid as needed now; plus her Oscal, etc...    Vit D defic>  On Vit D 5000u daily supplement; Vit D level is 35 & rec to continue same for now...     Anxiety> she has re-established w/ DrPoulis- on Valium5 & Lexapro20...  DERM> mycotically infected great toenail, prev referred to podiatry...   Patient's Medications  New Prescriptions   No medications on file  Previous Medications   CALCIUM-VITAMIN D (OSCAL WITH D) 500-200 MG-UNIT PER TABLET    Take 1 tablet by mouth daily.     DIAZEPAM (VALIUM) 5 MG TABLET    Take 1/2 tablet at 2:30 pm and #1 tablet at bedtime Take #2 at bedtime night of chemo treatment   DOCUSATE SODIUM (COLACE) 100 MG CAPSULE    Take 100 mg by mouth daily.   ESCITALOPRAM (LEXAPRO) 20 MG TABLET    Take 20 mg by mouth daily.   FISH OIL-OMEGA-3 FATTY ACIDS 1000 MG CAPSULE    Take 1 g by mouth daily.     FLAXSEED, LINSEED, (FLAX SEED OIL) 1000 MG CAPS    Take 1 capsule by mouth daily.   LIDOCAINE-PRILOCAINE (EMLA) CREAM    Apply topically as needed. Apply to [facility] site 2 hours prior to stick and cover with plastic wrap to numb site   MULTIPLE VITAMIN (MULTIVITAMIN WITH MINERALS) TABS    Take 1 tablet by mouth daily.   OXYCODONE-ACETAMINOPHEN  (PERCOCET/ROXICET) 5-325 MG PER TABLET    Take 1-2 tablets by mouth every 6 (six) hours as needed (moderate to severe pain (when tolerating fluids)).   POLYETHYLENE GLYCOL (MIRALAX / GLYCOLAX) PACKET    Take 17 g by mouth daily as needed.   PRAVASTATIN (PRAVACHOL) 40 MG TABLET    TAKE ONE TABLET BY MOUTH EVERY DAY   PROCHLORPERAZINE (COMPAZINE) 10 MG TABLET    Take 1 tablet (10 mg total) by mouth every 6 (six) hours as needed.  Modified Medications   No medications on file  Discontinued Medications   ESCITALOPRAM (LEXAPRO) 10 MG TABLET    Take 15 mg by mouth daily. In morning   PRAVASTATIN (PRAVACHOL) 40 MG TABLET    Take 1 tablet (40 mg total) by mouth daily.   PRAVASTATIN (PRAVACHOL) 40 MG TABLET    TAKE ONE TABLET BY MOUTH EVERY DAY

## 2013-03-07 NOTE — Patient Instructions (Addendum)
Today we updated your med list in our EPIC system...    Continue your current medications the same...  [name] [name], you have done a great job w/ your Surgery & Chemo- congrats...  Please let us known if we can be of service in any way...  Let's plan a follow up visit in 74mo, sooner if needed for problems.Marland KitchenMarland Kitchen

## 2013-03-21 ENCOUNTER — Encounter: Payer: Self-pay | Admitting: Gynecology

## 2013-03-21 ENCOUNTER — Ambulatory Visit: Payer: Medicare Other | Attending: Gynecology | Admitting: Gynecology

## 2013-03-21 VITALS — BP 98/70 | Temp 98.2°F | Resp 16 | Ht 65.0 in | Wt 140.4 lb

## 2013-03-21 DIAGNOSIS — K5900 Constipation, unspecified: Secondary | ICD-10-CM | POA: Insufficient documentation

## 2013-03-21 DIAGNOSIS — Z90710 Acquired absence of both cervix and uterus: Secondary | ICD-10-CM | POA: Insufficient documentation

## 2013-03-21 DIAGNOSIS — C569 Malignant neoplasm of unspecified ovary: Secondary | ICD-10-CM | POA: Insufficient documentation

## 2013-03-21 DIAGNOSIS — M069 Rheumatoid arthritis, unspecified: Secondary | ICD-10-CM | POA: Insufficient documentation

## 2013-03-21 DIAGNOSIS — M545 Low back pain, unspecified: Secondary | ICD-10-CM | POA: Insufficient documentation

## 2013-03-21 DIAGNOSIS — R109 Unspecified abdominal pain: Secondary | ICD-10-CM | POA: Insufficient documentation

## 2013-03-21 DIAGNOSIS — Z9221 Personal history of antineoplastic chemotherapy: Secondary | ICD-10-CM | POA: Insufficient documentation

## 2013-03-21 NOTE — Progress Notes (Signed)
Consult Note: Gyn-Onc   [name] [name] 62 y.o. female  Chief Complaint  Patient presents with  . Ovarian Cancer    Follow up    Assessment: stage IIIc ovarian cancer (optimally debulked. Status post 6 cycles of carboplatin and Taxol given in a dose dense regimen. She is clinically free of disease.  Chronic constipation.  Lower abdominal pain (mild). I believe this discomfort is secondary to surgical adhesions.  Plan we'll obtain a CT scan of the chest abdomen and pelvis in the near future to be certain there is no apparent persistent disease. Thereafter we will plan on seeing the patient every 3 months and obtain a CA 125 just prior to each of those visits. The patient continues to [facility] as. Her for constipation. She's encouraged use stretching exercises for her back.    Interval History: The patient returns today as previously scheduled for followup. She is now completed cycles of carboplatin and Taxol using the dose dense call. Cycles insert on 02/06/2013. CA 125 just prior to that last cycle was 5.6 units per mL. Patient seems to done well except for chronic constipation and some lower abdominal discomfort. She also has considerable amount of depressive symptoms. She has no GI or GU symptoms. She does complain of some low back pain. She has no pelvic pain pressure vaginal bleeding or discharge.     [name] is a 62 year old gravida 2 para 2 who went through menopause at the age about 62 years of age. She never took any hormone replacement therapy. She states that for the past 6 months she's experienced some pelvic soreness in pressure. She has noted some increasing urinary frequency. With these symptoms she had a PET scan on October 9. It revealed significant soft tissue thickening seen throughout the greater omentum suspicious for intraperitoneal carcinoma. There is minimal ascites in the pelvis. it revealed the areas of omental and peritoneal nodularity were hypermetabolic. There  is a dominant pelvic omental mass measuring 10.4 x 3.3 cm. There is a small omental implants in the left side of the abdomen measuring 1.1 cm. There were multiple pelvic foci That wereperitoneal based And hypermetabolic. These included the cul-de-sac. She has small volume of ascites. She underwent a CT-guided biopsy on July 24, 2012. It revealed metastatic carcinoma. By immunohistochemistry, malignant cells were positive for WT 1, cytokeratin 7, estrogen receptor. There were negative for TTF-1, progesterone receptor, cytokeratin 20, CEA, CD X2, and S100. Overall, the histologic features were highly suggestive of a primary gynecologic origin. Her CA 125 on October 10 was elevated at 69.  She underwent exploratory laparotomy on 08/22/2012 findings of stage IIIC ovarian cancer. Patient was optimally debulked. Debulking, however, required a rectosigmoid resection as well as total omentectomy total nominal hysterectomy bilateral salpingo-oophorectomy. She was optimally debulked with only 5 mm nodules on the right diaphragm as residual disease.  SURGICAL FINDINGS: At exploratory laparotomy the infracolic omentum was nearly replaced with tumor. This measured approximately 12 x 16 cm. There were 5 mm nodules on the right diaphragm. The small bowel serosa and mesentery were normal. In the pelvis it appeared that the left ovary was a primary source. It was cystic and densely adherent to the sigmoid mesentery. The right ovary was approximately 4 cm in diameter and had multiple excrescences on it. There were extensive tumor implants on the bladder flap and involving the sigmoid mesentery and serosa, and posterior cul-de-sac. At the conclusion of the surgical procedure the only residual disease were 5 mm nodules on  the right diaphragm. Chemotherapy was initiated in December 2013 using the dose dense regimen of carboplatin and Taxol. She received 6 cycles of carboplatin and Taxol the last administered on 02/06/2013. At that  time her CA 125 is 5.6 units per mL.  Review of Systems:10 point review of systems is negative as noted above.   Vitals: Blood pressure 98/70, temperature 98.2 F (36.8 C), temperature source Oral, resp. rate 16, height 5\' 5"  (1.651 m), weight 140 lb 6.4 oz (63.685 kg).  Physical Exam: General : The patient is a healthy woman in no acute distress.  HEENT: normocephalic, extraoccular movements normal; neck is supple without thyromegally  Lynphnodes: Supraclavicular and inguinal nodes not enlarged  Abdomen: Soft, non-tender, no ascites, no organomegally, no masses, no hernias  Pelvic:  EGBUS: Normal female  Vagina: Normal, no lesions  Urethra and Bladder: Normal, non-tender  Cervix: Surgically absent  Uterus: Surgically absent   Back there is some discomfort on palpation over her lumbar and sacrum. There also was a small lipoma on the left side. Bi-manual examination: Non-tender; no adenxal masses or nodularity  Rectal: normal sphincter tone, no masses, no blood  Lower extremities: No edema or varicosities. Normal range of motion      Allergies  Allergen Reactions  . Atorvastatin     REACTION: causes leg pain  . Decongest-Aid (Pseudoephedrine)     Makes my heart flutter  . Macrodantin     Past Medical History  Diagnosis Date  . Rheumatoid arthritis(714.0)   . Endometrial polyp   . Cystocele with uterine descensus     AND RECTOCELE  . Elevated cholesterol   . Cancer     Ovarian    Past Surgical History  Procedure Laterality Date  . Hand surgery  1996  . Tubal ligation  1980  . Hysteroscopy  2004    D&C  . Dilation and curettage of uterus  2004    WITH HYSTEROSCOPY  . Neck surgery  2009    SPURS  . Cyst on neck  2011  . Abdominal hysterectomy  08/22/2012    Procedure: HYSTERECTOMY ABDOMINAL;  Surgeon: Jeannette Corpus, MD;  Location: [facility];  Service: Gynecology;  Laterality: N/A;  . Salpingoophorectomy  08/22/2012    Procedure: SALPINGO OOPHORECTOMY;   Surgeon: Jeannette Corpus, MD;  Location: [facility];  Service: Gynecology;  Laterality: Bilateral;  . Omentectomy  08/22/2012    Procedure: OMENTECTOMY;  Surgeon: Jeannette Corpus, MD;  Location: [facility];  Service: Gynecology;  Laterality: N/A;  . Debulking  08/22/2012    Procedure: DEBULKING;  Surgeon: Jeannette Corpus, MD;  Location: [facility];  Service: Gynecology;  Laterality: N/A;  Radical tumor debulking, Bilateral Ureterolysis  . Colostomy revision  08/22/2012    Procedure: COLON RESECTION SIGMOID;  Surgeon: Jeannette Corpus, MD;  Location: [facility];  Service: Gynecology;;  Rectal Sigmoid resection and low rectal anastomosis    Current Outpatient Prescriptions  Medication Sig Dispense Refill  . calcium-vitamin D (OSCAL WITH D) 500-200 MG-UNIT per tablet Take 1 tablet by mouth daily.        . diazepam (VALIUM) 5 MG tablet Take 1/2 tablet at 2:30 pm and #1 tablet at bedtime Take #2 at bedtime night of chemo treatment      . docusate sodium (COLACE) 100 MG capsule Take 100 mg by mouth daily.      [name] escitalopram (LEXAPRO) 20 MG tablet Take 20 mg by mouth daily.      . fish  oil-omega-3 fatty acids 1000 MG capsule Take 1 g by mouth daily.        . Flaxseed, Linseed, (FLAX SEED OIL) 1000 MG CAPS Take 1 capsule by mouth daily.      [name] lidocaine-prilocaine (EMLA) cream Apply topically as needed. Apply to [facility] site 2 hours prior to stick and cover with plastic wrap to numb site  30 g  prn  . Multiple Vitamin (MULTIVITAMIN WITH MINERALS) TABS Take 1 tablet by mouth daily.      [name] oxyCODONE-acetaminophen (PERCOCET/ROXICET) 5-325 MG per tablet Take 1-2 tablets by mouth every 6 (six) hours as needed (moderate to severe pain (when tolerating fluids)).  30 tablet  0  . polyethylene glycol (MIRALAX / GLYCOLAX) packet Take 17 g by mouth daily as needed.      . pravastatin (PRAVACHOL) 40 MG tablet TAKE ONE TABLET BY MOUTH EVERY DAY  90 tablet  0  . prochlorperazine (COMPAZINE) 10 MG tablet  Take 1 tablet (10 mg total) by mouth every 6 (six) hours as needed.  60 tablet  2   No current facility-administered medications for this visit.   Facility-Administered Medications Ordered in Other Visits  Medication Dose Route Frequency Provider Last Rate Last Dose  . sodium chloride 0.9 % injection 10 mL  10 mL Intracatheter PRN [name], MD   10 mL at 01/16/13 1359    History   Social History  . Marital Status: Married    Spouse Name: N/A    Number of Children: N/A  . Years of Education: N/A   Occupational History  . Not on file.   Social History Main Topics  . Smoking status: Never Smoker   . Smokeless tobacco: Not on file  . Alcohol Use: No  . Drug Use: No  . Sexually Active: No   Other Topics Concern  . Not on file   Social History Narrative  . No narrative on file    Family History  Problem Relation Age of Onset  . Heart disease Mother   . Osteoporosis Sister   . Bladder Cancer Father 40  . Lymphoma Paternal Cruzita Lederer, MD 03/21/2013, 9:17 AM

## 2013-03-21 NOTE — Patient Instructions (Signed)
We will obtain a CT scan next week. Return to see me in 3 months. We will obtain a CA 125 just prior that visit.

## 2013-03-26 ENCOUNTER — Ambulatory Visit (HOSPITAL_BASED_OUTPATIENT_CLINIC_OR_DEPARTMENT_OTHER): Payer: Medicare Other | Admitting: Oncology

## 2013-03-26 ENCOUNTER — Other Ambulatory Visit (HOSPITAL_BASED_OUTPATIENT_CLINIC_OR_DEPARTMENT_OTHER): Payer: Medicare Other | Admitting: Lab

## 2013-03-26 ENCOUNTER — Telehealth: Payer: Self-pay | Admitting: Oncology

## 2013-03-26 VITALS — BP 124/74 | HR 72 | Temp 97.5°F | Resp 20 | Ht 65.0 in | Wt 143.8 lb

## 2013-03-26 DIAGNOSIS — C569 Malignant neoplasm of unspecified ovary: Secondary | ICD-10-CM

## 2013-03-26 LAB — CBC WITH DIFFERENTIAL/PLATELET
BASO%: 0.7 % (ref 0.0–2.0)
Eosinophils Absolute: 0.1 10*3/uL (ref 0.0–0.5)
HCT: 37.4 % (ref 34.8–46.6)
MCHC: 34.3 g/dL (ref 31.5–36.0)
MONO#: 0.5 10*3/uL (ref 0.1–0.9)
NEUT#: 3.9 10*3/uL (ref 1.5–6.5)
RBC: 3.36 10*6/uL — ABNORMAL LOW (ref 3.70–5.45)
WBC: 5.5 10*3/uL (ref 3.9–10.3)
lymph#: 1 10*3/uL (ref 0.9–3.3)

## 2013-03-26 LAB — CA 125: CA 125: 3.5 U/mL (ref 0.0–30.2)

## 2013-03-26 NOTE — Telephone Encounter (Signed)
gv and printed appt sched and avs for pt....pt sched for port removal 6.25.14 @ 9:30am at [facility]

## 2013-03-26 NOTE — Progress Notes (Signed)
   [facility]    OFFICE PROGRESS NOTE   INTERVAL HISTORY:   She returns as scheduled. She has completed the planned course of adjuvant chemotherapy. [name] reports her energy level has improved. The anxiety/depression is better while taking Lexapro and Valium. She reports intermittent discomfort in the right arm. She has noted a "knot "at the left iliac area  Objective:  Vital signs in last 24 hours:  Blood pressure 124/74, pulse 72, temperature 97.5 F (36.4 C), temperature source Oral, resp. rate 20, height 5\' 5"  (1.651 m), weight 143 lb 12.8 oz (65.227 kg).    HEENT: Neck without mass Lymphatics: No cervical, supraclavicular, axillary, or inguinal nodes Resp: Lungs clear bilaterally Cardio: Regular rate and rhythm GI: No hepatosplenomegaly, no mass, no apparent ascites Vascular: No leg edema  Musculoskeletal: Slight nodularity in the subcutaneous fat overlying the left posterior iliac, no discrete mass   Portacath/PICC-without erythema  Lab Results:  Lab Results  Component Value Date   WBC 5.5 03/26/2013   HGB 12.8 03/26/2013   HCT 37.4 03/26/2013   MCV 111.1* 03/26/2013   PLT 194 03/26/2013   ANC 3.9    Medications: I have reviewed the patient's current medications.  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 A 5 mm nodules remain on the right diaphragm. -Cycle 1 of adjuvant Taxol/carboplatin chemotherapy initiated on 09/19/2012 , the CA 125 normalized. She completed day 15 of cycle 6 on 02/06/2013.  2. Low abdomen/suprapubic pain prior to the exploratory laparotomy-likely secondary to omental/pelvic tumor , persistent mild pain in the right low abdomen-likely postoperative pain, she plans to schedule a GYN oncology evaluation.  3. Chronic neck and back pain  4. Anxiety -persistent  despite Lexapro and Xanax, now taking Valium. She has been evaluated by psychiatry.  5. Status post Port-A-Cath placement 09/22/2012.  6. Neutropenia secondary to chemotherapy- day 15 cycle 1 and cycle 3 Taxol/carboplatin not given secondary to neutropenia.   Disposition:  [name] [name] has completed adjuvant therapy. She is now in clinical remission from ovarian cancer. She is scheduled for a restaging CT later this week. We will arrange for removal of the Port-A-Cath within the next few weeks.  [name] [name]  will return for an office visit and CA 125 in 6 weeks.  [name] Papas, MD  03/26/2013  9:37 AM

## 2013-03-27 ENCOUNTER — Other Ambulatory Visit: Payer: Medicare Other | Admitting: Lab

## 2013-03-27 ENCOUNTER — Ambulatory Visit: Payer: Medicare Other | Admitting: Oncology

## 2013-03-27 ENCOUNTER — Other Ambulatory Visit: Payer: Self-pay | Admitting: Gynecologic Oncology

## 2013-03-27 DIAGNOSIS — C569 Malignant neoplasm of unspecified ovary: Secondary | ICD-10-CM

## 2013-03-28 ENCOUNTER — Other Ambulatory Visit: Payer: Medicare Other | Admitting: Lab

## 2013-03-28 ENCOUNTER — Telehealth: Payer: Self-pay | Admitting: *Deleted

## 2013-03-28 ENCOUNTER — Other Ambulatory Visit (HOSPITAL_COMMUNITY): Payer: Medicare Other

## 2013-03-28 NOTE — Telephone Encounter (Signed)
Spoke with pt at home and informed pt re:  CA 125 is normal as per Dr. Kalman Drape instructions.  Pt aware of next f/u appts.

## 2013-03-29 ENCOUNTER — Encounter (HOSPITAL_COMMUNITY): Payer: Self-pay

## 2013-03-29 ENCOUNTER — Ambulatory Visit (HOSPITAL_COMMUNITY)
Admission: RE | Admit: 2013-03-29 | Discharge: 2013-03-29 | Disposition: A | Discharge status: Home / Self Care | Payer: Medicare Other | Source: Ambulatory Visit | Attending: Gynecologic Oncology | Admitting: Gynecologic Oncology

## 2013-03-29 DIAGNOSIS — Z9079 Acquired absence of other genital organ(s): Secondary | ICD-10-CM | POA: Insufficient documentation

## 2013-03-29 DIAGNOSIS — C569 Malignant neoplasm of unspecified ovary: Secondary | ICD-10-CM | POA: Insufficient documentation

## 2013-03-29 DIAGNOSIS — K7689 Other specified diseases of liver: Secondary | ICD-10-CM | POA: Insufficient documentation

## 2013-03-29 DIAGNOSIS — C786 Secondary malignant neoplasm of retroperitoneum and peritoneum: Secondary | ICD-10-CM | POA: Insufficient documentation

## 2013-03-29 DIAGNOSIS — Z90710 Acquired absence of both cervix and uterus: Secondary | ICD-10-CM | POA: Insufficient documentation

## 2013-03-29 DIAGNOSIS — Z9221 Personal history of antineoplastic chemotherapy: Secondary | ICD-10-CM | POA: Insufficient documentation

## 2013-03-29 DIAGNOSIS — M51379 Other intervertebral disc degeneration, lumbosacral region without mention of lumbar back pain or lower extremity pain: Secondary | ICD-10-CM | POA: Insufficient documentation

## 2013-03-29 DIAGNOSIS — M5137 Other intervertebral disc degeneration, lumbosacral region: Secondary | ICD-10-CM | POA: Insufficient documentation

## 2013-03-29 DIAGNOSIS — N289 Disorder of kidney and ureter, unspecified: Secondary | ICD-10-CM | POA: Insufficient documentation

## 2013-03-29 MED ORDER — IOHEXOL 300 MG/ML  SOLN
100.0000 mL | Freq: Once | INTRAMUSCULAR | Status: AC | PRN
  Administered 2013-03-29: 100 mL via INTRAVENOUS

## 2013-04-01 ENCOUNTER — Other Ambulatory Visit: Payer: Self-pay | Admitting: Radiology

## 2013-04-02 ENCOUNTER — Telehealth: Payer: Self-pay | Admitting: Gynecologic Oncology

## 2013-04-02 ENCOUNTER — Other Ambulatory Visit: Payer: Self-pay | Admitting: Radiology

## 2013-04-02 NOTE — Telephone Encounter (Signed)
Patient informed of CT scan results and Dr. Nelwyn Salisbury recommendations for F/U 3 months with CA 125 before that visit.  No needs voiced.  Instructed to call for any needs or concerns.

## 2013-04-03 ENCOUNTER — Ambulatory Visit (HOSPITAL_COMMUNITY)
Admission: RE | Admit: 2013-04-03 | Discharge: 2013-04-03 | Disposition: A | Discharge status: Home / Self Care | Payer: Medicare Other | Source: Ambulatory Visit | Attending: Oncology | Admitting: Oncology

## 2013-04-03 ENCOUNTER — Other Ambulatory Visit (HOSPITAL_COMMUNITY): Payer: Medicare Other

## 2013-04-03 VITALS — BP 107/72 | HR 64 | Temp 97.6°F | Resp 24

## 2013-04-03 DIAGNOSIS — C569 Malignant neoplasm of unspecified ovary: Secondary | ICD-10-CM

## 2013-04-03 DIAGNOSIS — Z452 Encounter for adjustment and management of vascular access device: Secondary | ICD-10-CM | POA: Insufficient documentation

## 2013-04-03 LAB — CBC
Hemoglobin: 13.5 g/dL (ref 12.0–15.0)
MCH: 36.6 pg — ABNORMAL HIGH (ref 26.0–34.0)
MCHC: 33.2 g/dL (ref 30.0–36.0)
MCV: 110.3 fL — ABNORMAL HIGH (ref 78.0–100.0)
RBC: 3.69 MIL/uL — ABNORMAL LOW (ref 3.87–5.11)
RDW: 13.3 % (ref 11.5–15.5)
WBC: 5.7 10*3/uL (ref 4.0–10.5)

## 2013-04-03 MED ORDER — CEFAZOLIN SODIUM-DEXTROSE 2-3 GM-% IV SOLR
2.0000 g | INTRAVENOUS | Status: AC
  Administered 2013-04-03: 2 g via INTRAVENOUS
  Filled 2013-04-03: qty 50

## 2013-04-03 MED ORDER — LIDOCAINE HCL 1 % IJ SOLN
INTRAMUSCULAR | Status: AC
  Filled 2013-04-03: qty 20

## 2013-04-03 NOTE — Procedures (Signed)
RIJV PAC removal

## 2013-04-04 ENCOUNTER — Other Ambulatory Visit: Payer: Self-pay | Admitting: Oncology

## 2013-04-04 DIAGNOSIS — C569 Malignant neoplasm of unspecified ovary: Secondary | ICD-10-CM

## 2013-05-07 ENCOUNTER — Ambulatory Visit (HOSPITAL_BASED_OUTPATIENT_CLINIC_OR_DEPARTMENT_OTHER): Payer: Medicare Other | Admitting: Nurse Practitioner

## 2013-05-07 ENCOUNTER — Other Ambulatory Visit: Payer: Medicare Other | Admitting: Lab

## 2013-05-07 ENCOUNTER — Ambulatory Visit: Payer: Medicare Other | Admitting: Nurse Practitioner

## 2013-05-07 ENCOUNTER — Telehealth: Payer: Self-pay | Admitting: Oncology

## 2013-05-07 VITALS — BP 121/78 | HR 84 | Temp 97.9°F | Resp 18 | Ht 65.0 in | Wt 138.6 lb

## 2013-05-07 DIAGNOSIS — D709 Neutropenia, unspecified: Secondary | ICD-10-CM

## 2013-05-07 DIAGNOSIS — F411 Generalized anxiety disorder: Secondary | ICD-10-CM

## 2013-05-07 DIAGNOSIS — M549 Dorsalgia, unspecified: Secondary | ICD-10-CM

## 2013-05-07 DIAGNOSIS — R109 Unspecified abdominal pain: Secondary | ICD-10-CM

## 2013-05-07 DIAGNOSIS — C569 Malignant neoplasm of unspecified ovary: Secondary | ICD-10-CM

## 2013-05-07 DIAGNOSIS — M542 Cervicalgia: Secondary | ICD-10-CM

## 2013-05-07 NOTE — Telephone Encounter (Signed)
gv and printed appt sched and avs for pt  °

## 2013-05-07 NOTE — Progress Notes (Signed)
OFFICE PROGRESS NOTE  Interval history:  [name] [name] returns as scheduled. She notes that she tires easily. No nausea or vomiting. She has a good appetite. She continues to have problems with constipation. She takes a stool softener and MiraLAX. She has intermittent discomfort at the right lower abdomen. No urinary symptoms. She continues to note a "knot" at the left iliac area.   Objective: Blood pressure 121/78, pulse 84, temperature 97.9 F (36.6 C), temperature source Oral, resp. rate 18, height 5\' 5"  (1.651 m), weight 138 lb 9.6 oz (62.869 kg).  No thrush or ulcerations. No palpable cervical, supraclavicular, axillary or inguinal lymph nodes. Lungs are clear. Regular cardiac rhythm. Abdomen soft and nontender. No mass. No organomegaly. Extremities are without edema. Slight nodularity in the subcutaneous fat overlying the left posterior iliac. No discrete mass.  Lab Results: Lab Results  Component Value Date   WBC 5.7 04/03/2013   HGB 13.5 04/03/2013   HCT 40.7 04/03/2013   MCV 110.3* 04/03/2013   PLT 203 04/03/2013    Chemistry:    Chemistry      Component Value Date/Time   NA 139 01/16/2013 1031   NA 139 01/02/2013 0821   K 4.1 01/16/2013 1031   K 4.0 01/02/2013 0821   CL 103 01/16/2013 1031   CL 104 01/02/2013 0821   CO2 29 01/16/2013 1031   CO2 26 01/02/2013 0821   BUN 20 01/16/2013 1031   BUN 21.3 01/02/2013 0821   CREATININE 0.63 01/16/2013 1031   CREATININE 0.7 01/02/2013 0821      Component Value Date/Time   CALCIUM 9.3 01/16/2013 1031   CALCIUM 9.4 01/02/2013 0821   ALKPHOS 57 01/16/2013 1031   ALKPHOS 58 01/02/2013 0821   AST 20 01/16/2013 1031   AST 23 01/02/2013 0821   ALT 23 01/16/2013 1031   ALT 35 01/02/2013 0821   BILITOT 0.4 01/16/2013 1031   BILITOT 0.36 01/02/2013 0821       Studies/Results: No results found.  Medications: I have reviewed the patient's current medications.  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 A 5 mm nodules remain on the right diaphragm. -Cycle 1 of adjuvant Taxol/carboplatin chemotherapy initiated on 09/19/2012 , the CA 125 normalized. She completed day 15 of cycle 6 on 02/06/2013. Restaging CT evaluation 03/29/2013 showed no evidence of metastatic disease in the chest. There was marked improvement in appearance/resolution of previous described peritoneal/omental disease. There was no convincing evidence of residual disease. There was minimal increased density in the region of the omentum favored to be treatment-related. There was no pelvic adenopathy. 2. Low abdomen/suprapubic pain prior to the exploratory laparotomy-likely secondary to omental/pelvic tumor , persistent mild pain in the right low abdomen-likely postoperative pain. She followed up with GYN oncology.  3. Chronic neck and back pain  4. Anxiety -persistent despite Lexapro and Xanax, now taking Valium. She has been evaluated by psychiatry.  5. Status post Port-A-Cath placement 09/22/2012. The Port-A-Cath was removed on 04/03/2013. 6. Neutropenia secondary to chemotherapy- day 15 cycle 1 and cycle 3 Taxol/carboplatin not given secondary to neutropenia.  Disposition-[name] [name] appears stable. She remains in clinical remission from the ovarian cancer. We will followup on the CA 125 from today. She has a followup visit with Dr. Stanford Breed on 06/29/2013. She will return for a followup visit with Dr. Truett Perna in 6 months. She will contact the office in the interim  with any problems.  Plan reviewed with Dr. Truett Perna.   Lonna Cobb ANP/GNP-BC

## 2013-05-08 ENCOUNTER — Telehealth: Payer: Self-pay | Admitting: *Deleted

## 2013-05-08 NOTE — Telephone Encounter (Signed)
Message copied by Caleb Popp on Tue May 08, 2013  5:59 PM ------      Message from: Thornton Papas B      Created: Mon May 07, 2013  8:57 PM       Please call patient, ca125 is normal ------

## 2013-05-08 NOTE — Telephone Encounter (Signed)
Called pt with lab results. CEA normal, she voiced understanding, appreciation for call.

## 2013-05-18 ENCOUNTER — Telehealth: Payer: Self-pay | Admitting: Pulmonary Disease

## 2013-05-18 DIAGNOSIS — C569 Malignant neoplasm of unspecified ovary: Secondary | ICD-10-CM

## 2013-05-18 MED ORDER — PRAVASTATIN SODIUM 40 MG PO TABS: 40.0000 mg | ORAL_TABLET | Freq: Every day | ORAL | Status: DC

## 2013-05-18 NOTE — Telephone Encounter (Signed)
Spoke with patient,  patient requesting refill on pravastatin 40 mg 1 tab daily Rx sent to verified pharmacy, patient aware Nothing further needed att his time

## 2013-05-23 ENCOUNTER — Encounter: Payer: Self-pay | Admitting: Oncology

## 2013-05-23 NOTE — Progress Notes (Signed)
Put daughter's fmla form on nurse's desk. °

## 2013-06-25 ENCOUNTER — Other Ambulatory Visit: Payer: Medicare Other | Admitting: Lab

## 2013-06-25 ENCOUNTER — Telehealth: Payer: Self-pay | Admitting: Gynecologic Oncology

## 2013-06-25 DIAGNOSIS — C569 Malignant neoplasm of unspecified ovary: Secondary | ICD-10-CM

## 2013-06-25 NOTE — Telephone Encounter (Signed)
Patient notified of CA 125 results.  No concerns voiced.  F/U appt arranged for 06/29/13.

## 2013-06-29 ENCOUNTER — Encounter: Payer: Self-pay | Admitting: Gynecology

## 2013-06-29 ENCOUNTER — Ambulatory Visit: Payer: Medicare Other | Attending: Gynecology | Admitting: Gynecology

## 2013-06-29 VITALS — BP 112/70 | HR 76 | Temp 97.9°F | Resp 16 | Ht 65.0 in | Wt 137.0 lb

## 2013-06-29 DIAGNOSIS — C569 Malignant neoplasm of unspecified ovary: Secondary | ICD-10-CM | POA: Insufficient documentation

## 2013-06-29 DIAGNOSIS — Z9079 Acquired absence of other genital organ(s): Secondary | ICD-10-CM | POA: Insufficient documentation

## 2013-06-29 DIAGNOSIS — Z9221 Personal history of antineoplastic chemotherapy: Secondary | ICD-10-CM | POA: Insufficient documentation

## 2013-06-29 DIAGNOSIS — K5900 Constipation, unspecified: Secondary | ICD-10-CM | POA: Insufficient documentation

## 2013-06-29 DIAGNOSIS — Z90710 Acquired absence of both cervix and uterus: Secondary | ICD-10-CM | POA: Insufficient documentation

## 2013-06-29 DIAGNOSIS — E7800 Pure hypercholesterolemia, unspecified: Secondary | ICD-10-CM | POA: Insufficient documentation

## 2013-06-29 NOTE — Addendum Note (Signed)
Addended by: Warner Mccreedy D on: 06/29/2013 12:48 PM   Modules accepted: Orders

## 2013-06-29 NOTE — Patient Instructions (Addendum)
Plan to follow up in three months with a CA 125 lab draw before that visit.  Please call for any questions or concerns.

## 2013-06-29 NOTE — Progress Notes (Signed)
Consult Note: Gyn-Onc   Thermon Leyland 62 y.o. female  Chief Complaint  Patient presents with  . Ovarian Cancer    Follow up    Assessment: stage IIIc ovarian cancer (optimally debulked. Status post 6 cycles of carboplatin and Taxol given in a dose dense regimen. She is clinically free of disease.  Chronic constipation..  Plan  We will plan on seeing the patient in 3 months and obtain a CA 125 just prior to each of those visits. The patient continues to [facility] as. Her for constipation.   Interval History:  Patient returns as previously scheduled for followup. Since her last visit she's done well. She had a recent CA 125 value of 4.6 units per mL (previously was 4.7 units per mL). Patient seems to be doing well except for chronic constipation and some lower abdominal discomfort. . She has no GI or GU symptoms.  . She has no pelvic pain pressure vaginal bleeding or discharge.     [name] is a 62 year old gravida 2 para 2 who went through menopause at the age about 62 years of age. She never took any hormone replacement therapy. She states that for the past 6 months she's experienced some pelvic soreness in pressure. She has noted some increasing urinary frequency. With these symptoms she had a PET scan on October 9. It revealed significant soft tissue thickening seen throughout the greater omentum suspicious for intraperitoneal carcinoma. There is minimal ascites in the pelvis. it revealed the areas of omental and peritoneal nodularity were hypermetabolic. There is a dominant pelvic omental mass measuring 10.4 x 3.3 cm. There is a small omental implants in the left side of the abdomen measuring 1.1 cm. There were multiple pelvic foci That wereperitoneal based And hypermetabolic. These included the cul-de-sac. She has small volume of ascites. She underwent a CT-guided biopsy on July 24, 2012. It revealed metastatic carcinoma. By immunohistochemistry, malignant cells were positive for WT 1,  cytokeratin 7, estrogen receptor. There were negative for TTF-1, progesterone receptor, cytokeratin 20, CEA, CD X2, and S100. Overall, the histologic features were highly suggestive of a primary gynecologic origin. Her CA 125 on October 10 was elevated at 69.  She underwent exploratory laparotomy on 08/22/2012 findings of stage IIIC ovarian cancer. Patient was optimally debulked. Debulking, however, required a rectosigmoid resection as well as total omentectomy total nominal hysterectomy bilateral salpingo-oophorectomy. She was optimally debulked with only 5 mm nodules on the right diaphragm as residual disease.  SURGICAL FINDINGS: At exploratory laparotomy the infracolic omentum was nearly replaced with tumor. This measured approximately 12 x 16 cm. There were 5 mm nodules on the right diaphragm. The small bowel serosa and mesentery were normal. In the pelvis it appeared that the left ovary was a primary source. It was cystic and densely adherent to the sigmoid mesentery. The right ovary was approximately 4 cm in diameter and had multiple excrescences on it. There were extensive tumor implants on the bladder flap and involving the sigmoid mesentery and serosa, and posterior cul-de-sac. At the conclusion of the surgical procedure the only residual disease were 5 mm nodules on the right diaphragm. Chemotherapy was initiated in December 2013 using the dose dense regimen of carboplatin and Taxol   She received 6 cycles of carboplatin and Taxol the last administered on 02/06/2013. At that time her CA 125 is 5.6 units per mL.  Review of Systems:10 point review of systems is negative as noted above.   Vitals: Blood pressure 112/70, pulse 76, temperature  97.9 F (36.6 C), temperature source Oral, resp. rate 16, height 5\' 5"  (1.651 m), weight 137 lb (62.143 kg).  Physical Exam: General : The patient is a healthy woman in no acute distress.  HEENT: normocephalic, extraoccular movements normal; neck is supple  without thyromegally  Lynphnodes: Supraclavicular and inguinal nodes not enlarged  Abdomen: Soft, non-tender, no ascites, no organomegally, no masses, no hernias  Pelvic:  EGBUS: Normal female  Vagina: Normal, no lesions  Urethra and Bladder: Normal, non-tender  Cervix: Surgically absent  Uterus: Surgically absent   bi-manual examination: Non-tender; no adenxal masses or nodularity  Rectal: normal sphincter tone, no masses, no blood  Lower extremities: No edema or varicosities. Normal range of motion      Allergies  Allergen Reactions  . Atorvastatin     REACTION: causes leg pain  . Decongest-Aid [Pseudoephedrine]     Makes my heart flutter  . Macrodantin     Past Medical History  Diagnosis Date  . Rheumatoid arthritis(714.0)   . Endometrial polyp   . Cystocele with uterine descensus     AND RECTOCELE  . Elevated cholesterol   . Cancer     Ovarian    Past Surgical History  Procedure Laterality Date  . Hand surgery  1996  . Tubal ligation  1980  . Hysteroscopy  2004    D&C  . Dilation and curettage of uterus  2004    WITH HYSTEROSCOPY  . Neck surgery  2009    SPURS  . Cyst on neck  2011  . Abdominal hysterectomy  08/22/2012    Procedure: HYSTERECTOMY ABDOMINAL;  Surgeon: Jeannette Corpus, MD;  Location: [facility];  Service: Gynecology;  Laterality: N/A;  . Salpingoophorectomy  08/22/2012    Procedure: SALPINGO OOPHORECTOMY;  Surgeon: Jeannette Corpus, MD;  Location: [facility];  Service: Gynecology;  Laterality: Bilateral;  . Omentectomy  08/22/2012    Procedure: OMENTECTOMY;  Surgeon: Jeannette Corpus, MD;  Location: [facility];  Service: Gynecology;  Laterality: N/A;  . Debulking  08/22/2012    Procedure: DEBULKING;  Surgeon: Jeannette Corpus, MD;  Location: [facility];  Service: Gynecology;  Laterality: N/A;  Radical tumor debulking, Bilateral Ureterolysis  . Colostomy revision  08/22/2012    Procedure: COLON RESECTION SIGMOID;  Surgeon: Jeannette Corpus, MD;  Location: [facility];  Service: Gynecology;;  Rectal Sigmoid resection and low rectal anastomosis    Current Outpatient Prescriptions  Medication Sig Dispense Refill  . calcium-vitamin D (OSCAL WITH D) 500-200 MG-UNIT per tablet Take 1 tablet by mouth daily.        . diazepam (VALIUM) 5 MG tablet 2 mg. Take 1 tablet at 2:00 pm and 1-2 tablets at bedtime for sleep      . docusate sodium (COLACE) 100 MG capsule Take 100 mg by mouth daily.      [name] escitalopram (LEXAPRO) 20 MG tablet Take 30 mg by mouth daily. Takes at bedtime      . fish oil-omega-3 fatty acids 1000 MG capsule Take 1 g by mouth daily.        . Flaxseed, Linseed, (FLAX SEED OIL) 1000 MG CAPS Take 1 capsule by mouth daily.      . Multiple Vitamin (MULTIVITAMIN WITH MINERALS) TABS Take 1 tablet by mouth daily.      . polyethylene glycol (MIRALAX / GLYCOLAX) packet Take 17 g by mouth daily as needed.      . pravastatin (PRAVACHOL) 40 MG tablet Take 1 tablet (40  mg total) by mouth daily.  90 tablet  0   No current facility-administered medications for this visit.   Facility-Administered Medications Ordered in Other Visits  Medication Dose Route Frequency Provider Last Rate Last Dose  . sodium chloride 0.9 % injection 10 mL  10 mL Intracatheter PRN [name], MD   10 mL at 01/16/13 1359    History   Social History  . Marital Status: Married    Spouse Name: N/A    Number of Children: N/A  . Years of Education: N/A   Occupational History  . Not on file.   Social History Main Topics  . Smoking status: Never Smoker   . Smokeless tobacco: Not on file  . Alcohol Use: No  . Drug Use: No  . Sexual Activity: No   Other Topics Concern  . Not on file   Social History Narrative  . No narrative on file    Family History  Problem Relation Age of Onset  . Heart disease Mother   . Osteoporosis Sister   . Bladder Cancer Father 60  . Lymphoma Paternal Aunt       Jeannette Corpus,  MD 06/29/2013, 10:10 AM

## 2013-07-27 ENCOUNTER — Telehealth: Payer: Self-pay | Admitting: *Deleted

## 2013-07-27 NOTE — Telephone Encounter (Signed)
Pt is having follow up OV with  Dr.Clarke-Pearson for ovarian cancer, pt has annual scheduled with you on 09/10/13. Dr.G old patient, last annual was 09/07/11. Pt if she still has to come her for GYN visit or continue with Dr.Clarke-Pearson ? Please advise

## 2013-07-27 NOTE — Telephone Encounter (Signed)
Since she is following up with Dr. Kerri Perches next few weeks have her ask him for recommendations perhaps I could see her yearly and he could see her yearly as well until he releases were

## 2013-07-27 NOTE — Telephone Encounter (Signed)
Pt will follow up with Dr.Clarke-Pearson and see what his recommendation are and cancel if needed.

## 2013-09-10 ENCOUNTER — Encounter: Payer: Self-pay | Admitting: Gynecology

## 2013-09-11 ENCOUNTER — Ambulatory Visit (INDEPENDENT_AMBULATORY_CARE_PROVIDER_SITE_OTHER): Payer: Medicare Other | Admitting: Pulmonary Disease

## 2013-09-11 ENCOUNTER — Ambulatory Visit (INDEPENDENT_AMBULATORY_CARE_PROVIDER_SITE_OTHER)
Admission: RE | Admit: 2013-09-11 | Discharge: 2013-09-11 | Disposition: A | Discharge status: Home / Self Care | Payer: Medicare Other | Source: Ambulatory Visit | Attending: Pulmonary Disease | Admitting: Pulmonary Disease

## 2013-09-11 ENCOUNTER — Other Ambulatory Visit (INDEPENDENT_AMBULATORY_CARE_PROVIDER_SITE_OTHER): Payer: Medicare Other

## 2013-09-11 ENCOUNTER — Encounter: Payer: Self-pay | Admitting: Pulmonary Disease

## 2013-09-11 VITALS — BP 110/76 | HR 62 | Temp 97.5°F | Ht 66.0 in | Wt 133.2 lb

## 2013-09-11 DIAGNOSIS — E559 Vitamin D deficiency, unspecified: Secondary | ICD-10-CM

## 2013-09-11 DIAGNOSIS — M542 Cervicalgia: Secondary | ICD-10-CM

## 2013-09-11 DIAGNOSIS — C569 Malignant neoplasm of unspecified ovary: Secondary | ICD-10-CM

## 2013-09-11 DIAGNOSIS — F411 Generalized anxiety disorder: Secondary | ICD-10-CM

## 2013-09-11 DIAGNOSIS — E7800 Pure hypercholesterolemia, unspecified: Secondary | ICD-10-CM

## 2013-09-11 DIAGNOSIS — M1990 Unspecified osteoarthritis, unspecified site: Secondary | ICD-10-CM

## 2013-09-11 DIAGNOSIS — M549 Dorsalgia, unspecified: Secondary | ICD-10-CM

## 2013-09-11 DIAGNOSIS — Z0000 Encounter for general adult medical examination without abnormal findings: Secondary | ICD-10-CM

## 2013-09-11 DIAGNOSIS — Z8719 Personal history of other diseases of the digestive system: Secondary | ICD-10-CM

## 2013-09-11 DIAGNOSIS — Z23 Encounter for immunization: Secondary | ICD-10-CM

## 2013-09-11 DIAGNOSIS — Z9189 Other specified personal risk factors, not elsewhere classified: Secondary | ICD-10-CM

## 2013-09-11 DIAGNOSIS — Z8679 Personal history of other diseases of the circulatory system: Secondary | ICD-10-CM

## 2013-09-11 LAB — BASIC METABOLIC PANEL
Chloride: 99 mEq/L (ref 96–112)
Creatinine, Ser: 0.8 mg/dL (ref 0.4–1.2)
Potassium: 4 mEq/L (ref 3.5–5.1)
Sodium: 135 mEq/L (ref 135–145)

## 2013-09-11 LAB — CBC WITH DIFFERENTIAL/PLATELET
Basophils Relative: 0.3 % (ref 0.0–3.0)
Eosinophils Absolute: 0.1 10*3/uL (ref 0.0–0.7)
Hemoglobin: 15.1 g/dL — ABNORMAL HIGH (ref 12.0–15.0)
MCHC: 33.8 g/dL (ref 30.0–36.0)
MCV: 99.1 fl (ref 78.0–100.0)
Monocytes Absolute: 0.6 10*3/uL (ref 0.1–1.0)
Neutro Abs: 3.5 10*3/uL (ref 1.4–7.7)
Neutrophils Relative %: 66.8 % (ref 43.0–77.0)
RBC: 4.49 Mil/uL (ref 3.87–5.11)
RDW: 13.4 % (ref 11.5–14.6)

## 2013-09-11 LAB — HEPATIC FUNCTION PANEL
ALT: 27 U/L (ref 0–35)
Alkaline Phosphatase: 67 U/L (ref 39–117)
Bilirubin, Direct: 0.1 mg/dL (ref 0.0–0.3)
Total Bilirubin: 0.9 mg/dL (ref 0.3–1.2)

## 2013-09-11 LAB — LIPID PANEL
Cholesterol: 228 mg/dL — ABNORMAL HIGH (ref 0–200)
Total CHOL/HDL Ratio: 5
VLDL: 34.8 mg/dL (ref 0.0–40.0)

## 2013-09-11 NOTE — Progress Notes (Signed)
Subjective:    Patient ID: [name] [name], female    DOB: [birth year], 62 y.o.   MRN: [id number]  HPI 62 y/o WF here for a follow up visit...    ~  December 29, 2101:  4-57mo ROV & she appears quite anxious, rapid speech & somewhat loud; encouraged to take the Klonopin Bid regularly; she is concerned about her great toe nail- mycotic infection & offered Lamisil but she thinks it needs to come off & we will refer to Podiatry;  Also notes freq urination & wants Urinalysis & cult today...    MVP>  She notes occas self limited palpit on & off; denies CP/ angina etc; recall hx mother died suddenly in her 63's w/ MI; we gave her a lot of reassurance & reviewed risk factor reduction strategy; avoid caffeine & take the Klonopin!    Chol>  On Prav40 now & followed in the [facility]; FLP stable on this w/ [id number] & [id number]; she is also taking Fish Oil & Flax Seed Oil per her preference; I told her she may need a stonger statin if Prav40 + DIET don't get her closer to the goals...    IBS>  She denies abd pain, n/v/d/c/blood in stools etc; states she's doing satis w/o GI issues at present...    DJD/ neck pain, back pain>  She continues her f/u w/ DrAnderson for Rheum (she is on disability due to her arthritis per Rheum) & DrPool for NS; they manage her chronic pain as well & just on Naprosyn 220mg  Bid as needed now; vs OTC analgesics, plus her Oscal, etc...    Vit D defic>  On Vit D 5000u daily supplement but recently ran out; Vit D level is still on the low side at 35 today- rec continuing VitD 5000u OTC daily supplement...    Anxiety>  She still appears quite anxious; supposed to be taking Klonopin 0.5mg  Bid regularly for the palpit/ SOB/ etc; asked her to take Bid regularly so we could assess it's efficacy & see what her best dose will be...    DERM> onychomycosis would benefit from Lamisil in my opinion but she [facility]; we will send her to Podiatry for their review... CXR 3/13 showed stable  heart size, clear lungs, DJD in spine w/ neck hardware... LABS 3/13 showed:  FLP- not at goals on Prav40;  Chems- wnl;  CBC- wnl;  TSH=1.77;  VitD=35;  UA- clear x few wbc & awaiting C&S...  ~  July 05, 2012:  63mo ROV & [name] is c/o 69mo hx lower abd pain> off & on, noted worse if getting up or turning a certain way, noted it worse over bladder & saw GYN w/ [facility] showing fibroid but no acute changes & they want her to see GI, DrStark;  She notes some constip- hard, every 3-4d, not painful/ no blood;  uses Metamucil as needed;  We discussed further eval w/ CT Abd&Pelvis, treat w/ BENTYL 20mg  prn cramping, & refer to DrStark for colonoscopy...    Since last OV she has decided to stop the Prav40 & is using RYR instead "some woman told me about it"; she is not fasting for FLP today...     Similarly she has stopped her Klonopin "I don't need it, no problems!"... We reviewed prob list, meds, xrays and labs> see below for updates >> OK Flu vaccine today. LABS 10/13:  Chems- wnl;  CBC- wnl;  Sed- 17... ADDENDUM>> CT Abd&Pelvis 07/12/12>> signif  soft tissue caking throughout the omentum, min pelvic ascites, no adnexal mass or primary tumor identified, sm fibroids... This likely represents ovarian ca vs primary peritoneal carcinomatous> we will check Ca125, Ca19-9, & arrange for Bx by IR along w/ referral to Oncology...    ~  March 07, 2013:  63mo ROV & since she had her diagnosis of Ovarian Ca- stage lllc she has had Surg by [facility] 11/13 (optimum debulking surg required a rectosigmoid resection & total omentectomy, TAH, BSO) and ChemoRx from DrSherrill (w/ Carboplatin & Taxol) started 12/13 until 5/14 (her Ca125 has returned to normal 69=> 4.3=> 5.6... She has done well w/ the therapy but has had trouble w/ depression & her Psychiatrist- DrPoulas has adjusted her Lexapro up to 20mg /d... We reviewed the following medical problems during today's office visit >>     MVP> notes occas self limited palpit on &  off; denies CP/ angina etc; recall hx mother died suddenly in her 63's w/ MI; we gave her a lot of reassurance & reviewed risk factor reduction strategy; avoid caffeine & take meds from DrPoulas...    Chol> on Prav40; FLP 6/14 shows TChol 206, TG 136, HDL 48, LDL 134... rec to continue same med for now...    IBS> on Miralax, Colase, Compazine; she had rectosigmoid resection as part of her Ovarian Ca surg; improved w/ occas discomfort & min constip- the Miralax helps...     Ovarian cancer w/ mets, stage IIIc> SEE ABOVE    DJD/ neck pain, back pain> followed by York Pellant for Rheum (she is on disability from him) & DrPool for NS; they manage her chronic pain as well & taking Percocet5 prn...    Vit D defic> on Vit D 5000u daily supplement but recently ran out; Vit D level is still on the low side at 35- rec continuing VitD 5000u OTC daily supplement...    Anxiety> on Valium5 & Lexapro20; she has re-established w/ drPoulas for Psyche & getting counseling as well...    DERM> onychomycosis evaluated by Podiatry... We reviewed prob list, meds, xrays and labs> see below for updates >>  LABS 6/14:  FLP- improved on Prav40 but LDL=134; TSH=2.76;  VitD=36... LABS 4/14 in Epic> reviewed:  Chems- wnl;  CBC- anemic w/ Hg=10.6, MCV=115, WBC=3.4;  Ca125=5.6 CXR 11/13 showed normal heart size, clear lungs, prior Cspine fusion...  ~  September 11, 2013:  63mo ROV & CPX> [name] returns and requests her full CPX today; she reports feeling weak, no energy & doesn't feel like doing anything; states her bones are aching, not feeling well, and has mult somatic complaints; she appear quite anxious and states that she stopped her Prav40 some time ago because she heard it can cause cancer; interestingly- she does not feel any better after stopping the statin... We reviewed the following medical problems during today's office visit >>      MVP> notes occas self limited palpit on & off; denies CP/ angina etc; recall hx mother died  suddenly in her 32's w/ MI; we gave her a lot of reassurance & reviewed risk factor reduction strategy; avoid caffeine & take meds from DrPoulas...    Chol> off Prav40 (she stopped on her own "I heard it causes cancer"); FLP 12/14 on diet alone shows TChol 228, TG 174, HDL 47, LDL 158; offered to monitor on diet/ exercise vs start Zetia10...     IBS, constipation> on Miralax, Colase; she had rectosigmoid resection as part of her Ovarian Ca surg; improved w/  occas discomfort & min constip- the Miralax helps...     Ovarian cancer w/ mets, stage IIIc> SEE ABOVE> Ovarian Ca- stage lllc w/ surg by [facility] 11/13 (optimum debulking surg required a rectosigmoid resection & total omentectomy, TAH, BSO) and ChemoRx from DrSherrill (w/ Carboplatin & Taxol) started 12/13 until 5/14 (her Ca125 has returned to normal 69=> 4.6-4.8 range... Last CT scans 6/14 showed no residual or metastatic dis; her central line was removed 7/14; last saw DrPearson 9/14- clinically free of disease...     DJD/ neck pain, back pain> followed by York Pellant for Rheum (she is on disability from him) & DrPool for NS; they manage her chronic pain as well & taking Percocet5 prn...    Vit D defic> on Vit D 5000u daily supplement but recently ran out; Vit D level is still on the low side at 35- rec continuing VitD 5000u OTC daily supplement...    Anxiety/Depression> on Valium5 prn & Lexapro20-1.5tabs daily; she has re-established w/ LisaPoulas for Psyche counseling as well...    DERM> onychomycosis evaluated by Podiatry...  We reviewed prob list, meds, xrays and labs> see below for updates >> OK 2014 flu vaccine today... CXR 12/14 showed norm heart size, clear lungs, Cspine hardware, NAD.Marland KitchenMarland Kitchen  EKG 12/14 showed NSR, rate60, NSSTTWA... LABS 12/14:  FLP- not at goals off Prav40, LDL=158;  Chems- wnl;  CBC- wnl;  TSH=2.70;  VitD=40;  Ca125=4.8.Marland KitchenMarland Kitchen            Problem List:  CHEST WALL PAIN, HX OF (ICD-V15.89),  MITRAL VALVE PROLAPSE, HX  OF (ICD-V12.50) -  prev cardiac eval by DrNishan in 2005 due to coronary risk factors: pt's mother died suddenly in her 32's w/ MI... denies CP, palipit, dizziness, syncope, dyspnea, edema, etc...  ~  2DEcho 12/05 showed normal x ?HK inferiorly, EF= 55-60%... ~  NuclearStressTest 12/05 showed normal- no ischemia, no scar, EF= 65%... no change from 2003. ~  EKG= NSR, WNL.[name]. ~  CXR 9/10 & 9/11 showed NAD.[name]. ~  CXR 3/13 showed stable heart size, clear lungs, DJD in spine w/ neck hardware...  ~  CXR 11/13 showed normal heart size, clear lungs, prior Cspine fusion. ~  CXR 12/14 showed norm heart size, clear lungs, Cspine hardware, NAD...   HYPERCHOLESTEROLEMIA (ICD-272.0) - prev followed in the lipid clinic... she was on Lip20 but stopped ~6/11 due to leg cramps. ~  FLP 8/08 showed TChol 224, TG 69, HDL 54, LDL 162... this was off meds- statin restarted. ~  FLP 8/09 on Lip20 showed TChol 195, TG 100, HDL 65, LDL 110... improved- needs better diet. ~  FLP 9/10 on Lip20 showed TChol 198, TG 104, HDL 61, LDL 116... she stopped ~6/11 due to leg cramps. ~  FLP 9/11 on diet alone showed TChol 334, TG 170, HDL 60, LDL 238... needs ret to LipidClinic. ~  FLP 11/11 on Prav20 showed TChol 212, TG 111, HDL 54, LDL 129 ~  FLP 3/12 on Prav20 showed TChol 222, TG 126, HDL 57, LDL 153 ~  FLP 9/12 on Prav20 showed TChol 266, TG 138, HDL 62, LDL 184... Needs to incr Prav40 & f/u LC. ~  FLP 11/12 on Prav40 showed TChol 210, TG 136, HDL 57, LDL 141... She tells me that the [facility] told her to f/u here; Continue Prav40 & diet Rx. ~  FLP 3/13 on Prav40 showed TChol 217, TG 143, HDL 62, LDL 143... Told she may need stronger statin if Prav40 + diet don't work better... ~  10/13:  She reports stopping the Prav40 & has started RYR- "some woman told me about it"... ~  FLP 6/14 on Prav40 showed TChol 206, TG 136, HDL 48, LDL 134... Continue same for now. ~  12/14: off Prav40 (she stopped on her own "I heard it causes cancer"); FLP  12/14 on diet alone shows TChol 228, TG 174, HDL 47, LDL 158; offered to monitor on diet/ exercise vs start Zetia10.  IRRITABLE BOWEL SYNDROME, HX OF (ICD-V12.79) - last colonoscopy 7/05 by DrStark was WNL- no divertics, no polyps, etc... f/u planned 62yrs... ~  10/13: she presented w/ lower abd discomfort > see above & we decided to check CT Abd&Pelvis- pending. ~  11/13: she had ELap & surg for Ovarian Ca- optimal debulking, TAH, BSO, & required rectosigmoid resection... ~  12/14: on Miralax, Colase; she had rectosigmoid resection as part of her Ovarian Ca surg; improved w/ occas discomfort & min constip- the Miralax helps.  OVARIAN CANCER >>  ~  CT Abd&Pelvis 07/12/12>> signif soft tissue caking throughout the omentum, min pelvic ascites, no adnexal mass or primary tumor identified, sm fibroids... This likely represents ovarian ca vs primary peritoneal carcinomatous> we will check Ca125, Ca19-9, & arrange for Bx by IR along w/ referral to Oncology. ~  Ovarian Ca- stage lllc she has had Surg by [facility] 11/13 (optimum debulking surg required a rectosigmoid resection & total omentectomy, TAH, BSO) and ChemoRx from DrSherrill (w/ Carboplatin & Taxol) started 12/13 until 5/14 (her Ca125 has returned to normal 69=> 4.6-4.8 range... ~  Followed by [facility] & DrSherrill... ~  CT Chest, Abd, Pelvis 6/14 showed no acute process or evid of mets in chest; no resid/ measurable dis left in the omental/ peritoneal area, s/p hyst & BSO, DDD in lumbar spine...  DEGENERATIVE JOINT DISEASE (ICD-715.90),  NECK PAIN (ICD-723.1),  BACK PAIN (ICD-724.5) >>  She has a severe prob w/ neck pain and LBP... s/p MRI scans and prev Lumbar Lam in 2006 by DrPool... f/u myelogram and CT w/ bone spurs in neck and arthritis in her lower back... s/p ant cerv discectomy & fusion C6-7 for stenosis w/ myelopathy per DrPool... prev followed by DrBarker in Pain Management but now relying on DrAnderson & DrPool... she also  sees DrKuzma for Ortho w/ severe DJD of hands, Rx w/ Dosepak and Voltaren- min benefit & he has rec surgery but she is holding off & seeing DrAnderson for Rheum as noted... ~  9/11:  she notes now on disability per [facility] who continues to follow her regularly. ~  9/12:  She maintains f/u w/ York Pellant for Rheum & DrPool for Neurosurg; she indicates recent MRI spine done, we don't have recent notes...  VITAMIN D DEFICIENCY (ICD-268.9) - Vit D level 8/09 was 14 & she was started on 50,000 u caps weekly, then switched to 5000 u daily on her own...   ~  labs 9/10 showed Vit D level = 35... OK to continue the 5000 u daily for now. ~  labs 9/11 showed Vit D level = 39... rec to continue 5000 u daily... ~  Labs 3/13 showed Vit D level = 35... States she recently ran out of Vit D; rec to continue 5000u daily... ~  Labs 12/14 showed Vit D level = 40 and rec to continue OTC supplements...  ANXIETY DISORDER (ICD-300.00) - prev seen by psyche DrLisaPoulus & was on Celexa & Prosom... not currently on medications for nerves... ~  3/12:  We tried to add [facility]  0.5mg  Bid but she never took it... ~  11/12:  Pt c/o incr palpit esp when quiet at night; rec to restart KLONOPIN 0.5mg  Bid regularly... ~  3/13:  rec to take Klonopin 0.5mg  Bid regularly or consider re-establish w/ LisaPoulis... ~  10/13: she stopped the Klonopin & never re-established w/ LisaPoulis... ~  6/14: she has re-established w/ Lindaann Slough & on Valium5 & Lexapro20 now... ~  12/14: she notes Lexapro increased to 1.5tabs (30mg ) per day; she is still extremely anxious...  Health Maintenance - GYN= DrGottsegen w/ f/u PAP & Mammogram due now... she had prev BMD ?where... she was laid off work at [employer] where she had been for 5yrs, & worked for [employer] in [employer] for 49yrs, now on disability due to her arthritis...   Past Surgical History  Procedure Laterality Date  . Hand surgery  1996  . Tubal ligation  1980  . Hysteroscopy   2004    D&C  . Dilation and curettage of uterus  2004    WITH HYSTEROSCOPY  . Neck surgery  2009    SPURS  . Cyst on neck  2011  . Abdominal hysterectomy  08/22/2012    Procedure: HYSTERECTOMY ABDOMINAL;  Surgeon: Jeannette Corpus, MD;  Location: [facility];  Service: Gynecology;  Laterality: N/A;  . Salpingoophorectomy  08/22/2012    Procedure: SALPINGO OOPHORECTOMY;  Surgeon: Jeannette Corpus, MD;  Location: [facility];  Service: Gynecology;  Laterality: Bilateral;  . Omentectomy  08/22/2012    Procedure: OMENTECTOMY;  Surgeon: Jeannette Corpus, MD;  Location: [facility];  Service: Gynecology;  Laterality: N/A;  . Debulking  08/22/2012    Procedure: DEBULKING;  Surgeon: Jeannette Corpus, MD;  Location: [facility];  Service: Gynecology;  Laterality: N/A;  Radical tumor debulking, Bilateral Ureterolysis  . Colostomy revision  08/22/2012    Procedure: COLON RESECTION SIGMOID;  Surgeon: Jeannette Corpus, MD;  Location: [facility];  Service: Gynecology;;  Rectal Sigmoid resection and low rectal anastomosis     Outpatient Encounter Prescriptions as of 09/11/2013  Medication Sig  . calcium-vitamin D (OSCAL WITH D) 500-200 MG-UNIT per tablet Take 1 tablet by mouth daily.    . diazepam (VALIUM) 5 MG tablet 2 mg. Take 1 tablet at 2:00 pm and 1-2 tablets at bedtime for sleep  . docusate sodium (COLACE) 100 MG capsule Take 100 mg by mouth daily.  [name] escitalopram (LEXAPRO) 20 MG tablet Take 30 mg by mouth daily. Takes at bedtime  . fish oil-omega-3 fatty acids 1000 MG capsule Take 1 g by mouth daily.    . Flaxseed, Linseed, (FLAX SEED OIL) 1000 MG CAPS Take 1 capsule by mouth daily.  . Multiple Vitamin (MULTIVITAMIN WITH MINERALS) TABS Take 1 tablet by mouth daily.  . polyethylene glycol (MIRALAX / GLYCOLAX) packet Take 17 g by mouth daily as needed.  . pravastatin (PRAVACHOL) 40 MG tablet Take 1 tablet (40 mg total) by mouth daily.    Allergies  Allergen Reactions  .  Atorvastatin     REACTION: causes leg pain  . Decongest-Aid [Pseudoephedrine]     Makes my heart flutter  . Macrodantin     Current Medications, Allergies, Past Medical History, Past Surgical History, Family History, and Social History were reviewed in [employer] record.   Review of Systems         See HPI - all other systems neg except as noted... The patient complains of difficulty walking.  The patient denies anorexia,  fever, weight loss, weight gain, vision loss, decreased hearing, hoarseness, chest pain, syncope, dyspnea on exertion, peripheral edema, prolonged cough, headaches, hemoptysis, abdominal pain, melena, hematochezia, severe indigestion/heartburn, hematuria, incontinence, muscle weakness, suspicious skin lesions, transient blindness, depression, unusual weight change, abnormal bleeding, enlarged lymph nodes, and angioedema.     Objective:   Physical Exam     WD, WN, 62 y/o WF in NAD... GENERAL:  Alert & oriented; pleasant & cooperative... HEENT:  [place]/AT, EOM-wnl, PERRLA, EACs-clear, TMs-wnl, NOSE-clear, THROAT-clear & wnl. NECK:  Supple w/ decrROM; scar of ant cerv discectomy; no JVD; normal carotid impulses w/o bruits;  no thyromegaly or nodules palpated; no lymphadenopathy. CHEST:  Clear to P & A; without wheezes/ rales/ or rhonchi. HEART:  Regular Rhythm; without murmurs/ rubs/ or gallops. ABDOMEN:  Soft & nontender; normal bowel sounds; no organomegaly or masses detected. EXT: +heberdens & bouchards nodes;  no varicose veins/ venous insuffic/ or edema. NEURO:  CN's intact; motor testing normal; sensory testing normal; gait normal & balance OK. DERM:  No lesions noted; no rash etc...  RADIOLOGY DATA:  Reviewed in the EPIC EMR & discussed w/ the patient...  LABORATORY DATA:  Reviewed in the EPIC EMR & discussed w/ the patient...   Assessment & Plan:    OVARIAN CANCER> s/p Surg & ChemoRx as above=> followed by drClark-Pearson every 8mo &  DrSherrill...   MVP/ Palpit>  She notes occas self limited palpit on & off; denies CP/ angina etc...     Chol>  Off prev Prav40 due to side effects and she heard it causes cancer; offered monitoring on diet alone vs non-statin rx w/ [id number], she will decide...     IBS>  Symptoms improved; she required rectosigmoid resection as part of her Ovarian Ca surg 11/13; uses Miralax for constip...     DJD/ neck pain, back pain>  She continues her f/u w/ DrAnderson for Rheum & DrPool for NS; they manage her chronic pain as well & just on Naprosyn 220mg  Bid as needed now; plus her Oscal, etc...    Vit D defic>  On Vit D supplement; Vit D level is 40 & rec to continue same for now...     Anxiety> she has re-established w/ LisaPoulis- on Valium5 & Lexapro20- now up to 30mg /d...  DERM> mycotically infected great toenail, prev referred to podiatry...   Patient's Medications  New Prescriptions   EZETIMIBE (ZETIA) 10 MG TABLET    Take 1 tablet (10 mg total) by mouth daily.  Previous Medications   CALCIUM-VITAMIN D (OSCAL WITH D) 500-200 MG-UNIT PER TABLET    Take 1 tablet by mouth daily.     DIAZEPAM (VALIUM) 5 MG TABLET    2 mg. Take 1 tablet at 2:00 pm and 1-2 tablets at bedtime for sleep   DOCUSATE SODIUM (COLACE) 100 MG CAPSULE    Take 100 mg by mouth daily.   ESCITALOPRAM (LEXAPRO) 20 MG TABLET    Take 30 mg by mouth daily. Takes at bedtime   FISH OIL-OMEGA-3 FATTY ACIDS 1000 MG CAPSULE    Take 1 g by mouth daily.     FLAXSEED, LINSEED, (FLAX SEED OIL) 1000 MG CAPS    Take 1 capsule by mouth daily.   MULTIPLE VITAMIN (MULTIVITAMIN WITH MINERALS) TABS    Take 1 tablet by mouth daily.   POLYETHYLENE GLYCOL (MIRALAX / GLYCOLAX) PACKET    Take 17 g by mouth daily as needed.  Modified Medications   No medications on file  Discontinued Medications  PRAVASTATIN (PRAVACHOL) 40 MG TABLET    Take 1 tablet (40 mg total) by mouth daily.

## 2013-09-11 NOTE — Patient Instructions (Signed)
Today we updated your med list in our EPIC system...    We decided to STOP the Pravastatin & go with DIET ALONE for now for your cholesterol...  Today we did your Physical Exam w/ CXR, EKG, & FASTING blood work...    We will contact you w/ the results when available...   We gave you the 2014 Flu vaccine today...  You need to get into a regular daily exercise program...  Keep your follow up appts w/ your cancer doctors...  Call for any questions...  Let's plan a follow up visit in 30mo, sooner if needed for problems.Marland KitchenMarland Kitchen

## 2013-09-12 ENCOUNTER — Other Ambulatory Visit: Payer: Self-pay | Admitting: Pulmonary Disease

## 2013-09-12 MED ORDER — EZETIMIBE 10 MG PO TABS: 10.0000 mg | ORAL_TABLET | Freq: Every day | ORAL | Status: DC

## 2013-09-14 ENCOUNTER — Telehealth: Payer: Self-pay | Admitting: Pulmonary Disease

## 2013-09-14 MED ORDER — PRAVASTATIN SODIUM 40 MG PO TABS: 40.0000 mg | ORAL_TABLET | Freq: Every day | ORAL | Status: DC

## 2013-09-14 NOTE — Telephone Encounter (Signed)
I called and spoke with pt. She reports she wants to try the pravastatin. Please advise SN thanks

## 2013-09-14 NOTE — Telephone Encounter (Signed)
Called, spoke with pt -  Reports she is no longer taking the pravastatin and zetia qd was added.  Pt states the zetia will be $30/mo compared to pravastatin at $3/ 90 days.  Also, reports she was advised by pharmacy that the zetia is not as effective as the pravastatin but the pravastatin goes through the liver.  Pt would like to know if SN believes the pravastatin would work for her given the price difference or if he feels the zetia is a better option.  Dr. Kriste Basque, pls advise.  Thank you.

## 2013-09-14 NOTE — Telephone Encounter (Signed)
Per SN-he is okay with patient going BACK on Pravastatin-please send Pravastatin 40 mg #90 take 1 po QD with 3 refills. Thanks.

## 2013-09-14 NOTE — Telephone Encounter (Signed)
Pt is aware. Nothing further needed 

## 2013-09-14 NOTE — Telephone Encounter (Signed)
Per SN- "I do not understand"; she said she wouldnt take the Pravastatin 40 mg because she read it causes cancer and refused this med. Labs were not as good off meds (LDL 198). I offer Zetia 10 mg if she wants; other option to referral to Lipid Clinic.

## 2013-09-14 NOTE — Telephone Encounter (Signed)
[id number] x1 

## 2013-09-20 ENCOUNTER — Other Ambulatory Visit: Payer: Medicare Other

## 2013-09-21 ENCOUNTER — Other Ambulatory Visit: Payer: Self-pay | Admitting: Gynecology

## 2013-09-21 ENCOUNTER — Encounter: Payer: Self-pay | Admitting: Gynecology

## 2013-09-21 ENCOUNTER — Ambulatory Visit: Payer: Medicare Other | Attending: Gynecology | Admitting: Gynecology

## 2013-09-21 VITALS — BP 99/69 | HR 73 | Temp 98.2°F | Resp 16 | Ht 65.0 in | Wt 132.8 lb

## 2013-09-21 DIAGNOSIS — C569 Malignant neoplasm of unspecified ovary: Secondary | ICD-10-CM | POA: Insufficient documentation

## 2013-09-21 DIAGNOSIS — Z9079 Acquired absence of other genital organ(s): Secondary | ICD-10-CM | POA: Insufficient documentation

## 2013-09-21 DIAGNOSIS — Z90710 Acquired absence of both cervix and uterus: Secondary | ICD-10-CM | POA: Insufficient documentation

## 2013-09-21 DIAGNOSIS — Z1231 Encounter for screening mammogram for malignant neoplasm of breast: Secondary | ICD-10-CM

## 2013-09-21 DIAGNOSIS — E7800 Pure hypercholesterolemia, unspecified: Secondary | ICD-10-CM | POA: Insufficient documentation

## 2013-09-21 DIAGNOSIS — M069 Rheumatoid arthritis, unspecified: Secondary | ICD-10-CM | POA: Insufficient documentation

## 2013-09-21 DIAGNOSIS — K5900 Constipation, unspecified: Secondary | ICD-10-CM | POA: Insufficient documentation

## 2013-09-21 DIAGNOSIS — Z79899 Other long term (current) drug therapy: Secondary | ICD-10-CM | POA: Insufficient documentation

## 2013-09-21 DIAGNOSIS — Z9221 Personal history of antineoplastic chemotherapy: Secondary | ICD-10-CM | POA: Insufficient documentation

## 2013-09-21 NOTE — Patient Instructions (Signed)
Return to see [place] in 3 months. Be sure future mammograms as scheduled.

## 2013-09-21 NOTE — Progress Notes (Signed)
Consult Note: Gyn-Onc   [name] [name] 61 y.o. female  Chief Complaint  Patient presents with  . Ovarian Cancer    Follow up    Assessment: stage IIIc ovarian cancer (optimally debulked. Status post 6 cycles of carboplatin and Taxol given in a dose dense regimen. She is clinically free of disease.  Chronic constipation..  Plan  We will plan on seeing the patient in 3 months and obtain a CA 125 just prior to each of those visits. The patient continues to [facility] as. Her for constipation. She will schedule mammograms in the near future. She is due for colonoscopy in one year.  Interval History:  Patient returns as previously scheduled for followup. Since her last visit she's done well. She had a recent CA 125 value of 4.8 units per mL (previously was 4.7 units per mL). Patient seems to be doing well except for chronic constipation and some lower abdominal discomfort. . She has no GI or GU symptoms.  . She has no pelvic pain pressure vaginal discharge. She did have one day of slight pink tinged vaginal discharge. There is no bright red bleeding.     [name] is a 62 year old gravida 2 para 2 who went through menopause at the age about 62 years of age. She never took any hormone replacement therapy. She states that for the past 6 months she's experienced some pelvic soreness in pressure. She has noted some increasing urinary frequency. With these symptoms she had a PET scan on October 9. It revealed significant soft tissue thickening seen throughout the greater omentum suspicious for intraperitoneal carcinoma. There is minimal ascites in the pelvis. it revealed the areas of omental and peritoneal nodularity were hypermetabolic. There is a dominant pelvic omental mass measuring 10.4 x 3.3 cm. There is a small omental implants in the left side of the abdomen measuring 1.1 cm. There were multiple pelvic foci That wereperitoneal based And hypermetabolic. These included the cul-de-sac. She has small  volume of ascites. She underwent a CT-guided biopsy on July 24, 2012. It revealed metastatic carcinoma. By immunohistochemistry, malignant cells were positive for WT 1, cytokeratin 7, estrogen receptor. There were negative for TTF-1, progesterone receptor, cytokeratin 20, CEA, CD X2, and S100. Overall, the histologic features were highly suggestive of a primary gynecologic origin. Her CA 125 on October 10 was elevated at 69.  She underwent exploratory laparotomy on 08/22/2012 findings of stage IIIC ovarian cancer. Patient was optimally debulked. Debulking, however, required a rectosigmoid resection as well as total omentectomy total nominal hysterectomy bilateral salpingo-oophorectomy. She was optimally debulked with only 5 mm nodules on the right diaphragm as residual disease.  SURGICAL FINDINGS: At exploratory laparotomy the infracolic omentum was nearly replaced with tumor. This measured approximately 12 x 16 cm. There were 5 mm nodules on the right diaphragm. The small bowel serosa and mesentery were normal. In the pelvis it appeared that the left ovary was a primary source. It was cystic and densely adherent to the sigmoid mesentery. The right ovary was approximately 4 cm in diameter and had multiple excrescences on it. There were extensive tumor implants on the bladder flap and involving the sigmoid mesentery and serosa, and posterior cul-de-sac. At the conclusion of the surgical procedure the only residual disease were 5 mm nodules on the right diaphragm. Chemotherapy was initiated in December 2013 using the dose dense regimen of carboplatin and Taxol   She received 6 cycles of carboplatin and Taxol the last administered on 02/06/2013. At that time  her CA 125 is 5.6 units per mL.  Review of Systems:10 point review of systems is negative as noted above.   Vitals: Blood pressure 99/69, pulse 73, temperature 98.2 F (36.8 C), temperature source Oral, resp. rate 16, height 5\' 5"  (1.651 m), weight 132  lb 12.8 oz (60.238 kg).  Physical Exam: General : The patient is a healthy woman in no acute distress.  HEENT: normocephalic, extraoccular movements normal; neck is supple without thyromegally  Lynphnodes: Supraclavicular and inguinal nodes not enlarged Breasts are without masses, skin changes, or nipple discharge. Axillary nodes are not enlarged.  Abdomen: Soft, non-tender, no ascites, no organomegally, no masses, no hernias  Pelvic:  EGBUS: Normal female  Vagina: Normal, no lesions no evidence of bleeding. Urethra and Bladder: Normal, non-tender  Cervix: Surgically absent  Uterus: Surgically absent   bi-manual examination: Non-tender; no adenxal masses or nodularity  Rectal: normal sphincter tone, no masses, no blood  Lower extremities: No edema or varicosities. Normal range of motion      Allergies  Allergen Reactions  . Atorvastatin     REACTION: causes leg pain  . Decongest-Aid [Pseudoephedrine]     Makes my heart flutter  . Macrodantin     Past Medical History  Diagnosis Date  . Rheumatoid arthritis(714.0)   . Endometrial polyp   . Cystocele with uterine descensus     AND RECTOCELE  . Elevated cholesterol   . Cancer     Ovarian    Past Surgical History  Procedure Laterality Date  . Hand surgery  1996  . Tubal ligation  1980  . Hysteroscopy  2004    D&C  . Dilation and curettage of uterus  2004    WITH HYSTEROSCOPY  . Neck surgery  2009    SPURS  . Cyst on neck  2011  . Abdominal hysterectomy  08/22/2012    Procedure: HYSTERECTOMY ABDOMINAL;  Surgeon: Jeannette Corpus, MD;  Location: [facility];  Service: Gynecology;  Laterality: N/A;  . Salpingoophorectomy  08/22/2012    Procedure: SALPINGO OOPHORECTOMY;  Surgeon: Jeannette Corpus, MD;  Location: [facility];  Service: Gynecology;  Laterality: Bilateral;  . Omentectomy  08/22/2012    Procedure: OMENTECTOMY;  Surgeon: Jeannette Corpus, MD;  Location: [facility];  Service: Gynecology;  Laterality:  N/A;  . Debulking  08/22/2012    Procedure: DEBULKING;  Surgeon: Jeannette Corpus, MD;  Location: [facility];  Service: Gynecology;  Laterality: N/A;  Radical tumor debulking, Bilateral Ureterolysis  . Colostomy revision  08/22/2012    Procedure: COLON RESECTION SIGMOID;  Surgeon: Jeannette Corpus, MD;  Location: [facility];  Service: Gynecology;;  Rectal Sigmoid resection and low rectal anastomosis    Current Outpatient Prescriptions  Medication Sig Dispense Refill  . calcium-vitamin D (OSCAL WITH D) 500-200 MG-UNIT per tablet Take 1 tablet by mouth daily.        . diazepam (VALIUM) 5 MG tablet 2 mg. Take 1 tablet at 2:00 pm and 1-2 tablets at bedtime for sleep      . docusate sodium (COLACE) 100 MG capsule Take 100 mg by mouth daily.      [name] escitalopram (LEXAPRO) 20 MG tablet Take 30 mg by mouth daily. Takes at bedtime      . ezetimibe (ZETIA) 10 MG tablet Take 1 tablet (10 mg total) by mouth daily.  30 tablet  6  . fish oil-omega-3 fatty acids 1000 MG capsule Take 1 g by mouth daily.        [name]  Flaxseed, Linseed, (FLAX SEED OIL) 1000 MG CAPS Take 1 capsule by mouth daily.      . Multiple Vitamin (MULTIVITAMIN WITH MINERALS) TABS Take 1 tablet by mouth daily.      . polyethylene glycol (MIRALAX / GLYCOLAX) packet Take 17 g by mouth daily as needed.      . pravastatin (PRAVACHOL) 40 MG tablet Take 1 tablet (40 mg total) by mouth daily.  90 tablet  3   No current facility-administered medications for this visit.   Facility-Administered Medications Ordered in Other Visits  Medication Dose Route Frequency Provider Last Rate Last Dose  . sodium chloride 0.9 % injection 10 mL  10 mL Intracatheter PRN [name], MD   10 mL at 01/16/13 1359    History   Social History  . Marital Status: Married    Spouse Name: N/A    Number of Children: N/A  . Years of Education: N/A   Occupational History  . Not on file.   Social History Main Topics  . Smoking status: Never Smoker   .  Smokeless tobacco: Not on file  . Alcohol Use: No  . Drug Use: No  . Sexual Activity: No   Other Topics Concern  . Not on file   Social History Narrative  . No narrative on file    Family History  Problem Relation Age of Onset  . Heart disease Mother   . Osteoporosis Sister   . Bladder Cancer Father 22  . Lymphoma Paternal Cruzita Lederer, MD 09/21/2013, 9:32 AM

## 2013-09-25 ENCOUNTER — Encounter: Payer: Self-pay | Admitting: Gynecology

## 2013-10-11 ENCOUNTER — Telehealth: Payer: Self-pay | Admitting: Pulmonary Disease

## 2013-10-11 NOTE — Telephone Encounter (Signed)
Error.[name] [name] ° °

## 2013-10-24 ENCOUNTER — Ambulatory Visit (HOSPITAL_COMMUNITY): Payer: Medicare Other

## 2013-10-25 ENCOUNTER — Ambulatory Visit (HOSPITAL_COMMUNITY)
Admission: RE | Admit: 2013-10-25 | Discharge: 2013-10-25 | Disposition: A | Discharge status: Home / Self Care | Payer: Medicare Other | Source: Ambulatory Visit | Attending: Gynecology | Admitting: Gynecology

## 2013-10-25 DIAGNOSIS — Z1231 Encounter for screening mammogram for malignant neoplasm of breast: Secondary | ICD-10-CM | POA: Insufficient documentation

## 2013-11-05 ENCOUNTER — Encounter (INDEPENDENT_AMBULATORY_CARE_PROVIDER_SITE_OTHER): Payer: Self-pay

## 2013-11-05 ENCOUNTER — Telehealth: Payer: Self-pay | Admitting: Oncology

## 2013-11-05 ENCOUNTER — Ambulatory Visit (HOSPITAL_BASED_OUTPATIENT_CLINIC_OR_DEPARTMENT_OTHER): Payer: Medicare Other | Admitting: Oncology

## 2013-11-05 VITALS — BP 118/76 | HR 86 | Temp 97.5°F | Resp 18 | Ht 65.0 in | Wt 133.5 lb

## 2013-11-05 DIAGNOSIS — R29898 Other symptoms and signs involving the musculoskeletal system: Secondary | ICD-10-CM

## 2013-11-05 DIAGNOSIS — M542 Cervicalgia: Secondary | ICD-10-CM

## 2013-11-05 DIAGNOSIS — R109 Unspecified abdominal pain: Secondary | ICD-10-CM

## 2013-11-05 DIAGNOSIS — M549 Dorsalgia, unspecified: Secondary | ICD-10-CM

## 2013-11-05 DIAGNOSIS — F411 Generalized anxiety disorder: Secondary | ICD-10-CM

## 2013-11-05 DIAGNOSIS — C569 Malignant neoplasm of unspecified ovary: Secondary | ICD-10-CM

## 2013-11-05 NOTE — Progress Notes (Signed)
   [place]    OFFICE PROGRESS NOTE   INTERVAL HISTORY:   [name] returns for scheduled followup of ovarian cancer. She has multiple complaints including intermittent low abdominal pain, left lower back pain, and 2 episodes of vaginal spotting. She reports having spotting on one occasion prior to seeing Dr. Aldean Ast last month. She relates memory loss to the current depression medications.  The left leg occasionally "gives way ". She is scheduled to see Dr. Annette Stable next week  Objective:  Vital signs in last 24 hours:  Blood pressure 118/76, pulse 86, temperature 97.5 F (36.4 C), temperature source Oral, resp. rate 18, height 5\' 5"  (1.651 m), weight 133 lb 8 oz (60.555 kg).    HEENT: Neck without mass Lymphatics: No cervical, supra-clavicular, axillary, or inguinal nodes Resp: Lungs clear bilaterally Cardio: Regular rate and rhythm GI: No hepatomegaly, tender in the left greater than right lower abdomen, no mass, no apparent ascites Vascular: No leg edema Neuro: The leg strength is intact bilaterally  Musculoskeletal: Tender over the left iliac. No mass.   Portacath/PICC-without erythema  Lab Results:  CA 125 on 09/11/2013-4.8   Medications: I have reviewed the patient's current medications.  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 A 5 mm nodules remain on the right diaphragm. -Cycle 1 of adjuvant Taxol/carboplatin chemotherapy initiated on 09/19/2012 , the CA 125 normalized. She completed day 15 of cycle 6 on 02/06/2013. Restaging CT evaluation 03/29/2013 showed no evidence of metastatic disease in the chest. There was marked improvement in appearance/resolution of previous described peritoneal/omental disease. There was no convincing evidence of residual disease. There was minimal  increased density in the region of the omentum favored to be treatment-related. There was no pelvic adenopathy.  2. Low abdomen/suprapubic pain prior to the exploratory laparotomy-likely secondary to omental/pelvic tumor , persistent mild pain in the lower abdomen 3. Chronic neck and back pain  4. Anxiety -persistent despite Lexapro and Xanax, now taking Valium. She has been evaluated by psychiatry.  5. Status post Port-A-Cath placement 09/22/2012. The Port-A-Cath was removed on 04/03/2013.  6. Neutropenia secondary to chemotherapy- day 15 cycle 1 and cycle 3 Taxol/carboplatin not given secondary to neutropenia.  7. Left lower back pain and left leg weakness-she is scheduled to see Dr. Annette Stable next week   Disposition:  [name] remains in clinical remission from ovarian cancer. She will be scheduled to see Dr. Aldean Ast for an office visit in 3 months. She will return for an office visit here in 6 months. We will check a CA 125 at each visit.  I doubt the back pain is related to the ovarian cancer diagnosis. She will see Dr. Annette Stable next week.   [name], MD  11/05/2013  4:17 PM

## 2013-11-05 NOTE — Telephone Encounter (Signed)
gave pt appt for lab and ML on August and see GYN on May 2015

## 2013-11-05 NOTE — Telephone Encounter (Signed)
returned pt call and advised on todays appt.

## 2013-12-04 ENCOUNTER — Other Ambulatory Visit: Payer: Self-pay | Admitting: Gynecologic Oncology

## 2013-12-04 DIAGNOSIS — C569 Malignant neoplasm of unspecified ovary: Secondary | ICD-10-CM

## 2013-12-14 ENCOUNTER — Telehealth: Payer: Self-pay | Admitting: *Deleted

## 2013-12-14 NOTE — Telephone Encounter (Signed)
Called to inquire if OK for her to color her hair now? Also concerned that her 3 month follow up for labs/Dr. Aldean Ast is scheduled for 3 1/2 months instead.  Informed her OK to color her hair now and 3 1/2 months is fine.

## 2014-02-05 ENCOUNTER — Encounter: Payer: Self-pay | Admitting: Gastroenterology

## 2014-02-19 ENCOUNTER — Encounter: Payer: Self-pay | Admitting: Oncology

## 2014-02-19 ENCOUNTER — Other Ambulatory Visit (HOSPITAL_BASED_OUTPATIENT_CLINIC_OR_DEPARTMENT_OTHER): Payer: Medicare Other

## 2014-02-19 DIAGNOSIS — C569 Malignant neoplasm of unspecified ovary: Secondary | ICD-10-CM

## 2014-02-19 NOTE — Progress Notes (Signed)
Put daughter's fmla form on nurse's desk. °

## 2014-02-20 ENCOUNTER — Ambulatory Visit: Payer: Medicare Other | Attending: Gynecology | Admitting: Gynecology

## 2014-02-20 ENCOUNTER — Encounter: Payer: Self-pay | Admitting: Gynecology

## 2014-02-20 ENCOUNTER — Ambulatory Visit: Payer: Medicare Other | Admitting: Gynecology

## 2014-02-20 VITALS — BP 114/72 | HR 75 | Temp 98.3°F | Resp 16 | Ht 65.0 in | Wt 128.3 lb

## 2014-02-20 DIAGNOSIS — K5900 Constipation, unspecified: Secondary | ICD-10-CM | POA: Insufficient documentation

## 2014-02-20 DIAGNOSIS — Z8543 Personal history of malignant neoplasm of ovary: Secondary | ICD-10-CM | POA: Insufficient documentation

## 2014-02-20 DIAGNOSIS — Z09 Encounter for follow-up examination after completed treatment for conditions other than malignant neoplasm: Secondary | ICD-10-CM | POA: Insufficient documentation

## 2014-02-20 DIAGNOSIS — M069 Rheumatoid arthritis, unspecified: Secondary | ICD-10-CM | POA: Insufficient documentation

## 2014-02-20 DIAGNOSIS — Z780 Asymptomatic menopausal state: Secondary | ICD-10-CM | POA: Insufficient documentation

## 2014-02-20 DIAGNOSIS — C569 Malignant neoplasm of unspecified ovary: Secondary | ICD-10-CM

## 2014-02-20 LAB — CA 125: CA 125: 3.2 U/mL (ref 0.0–30.2)

## 2014-02-20 NOTE — Progress Notes (Signed)
Consult Note: Gyn-Onc   [name] [name] 63 y.o. female  Chief Complaint  Patient presents with  . Ovarian Cancer    Follow up     Assessment: stage IIIc ovarian cancer (optimally debulked. Status post 6 cycles of carboplatin and Taxol given in a dose dense regimen. She is clinically free of disease.  Chronic constipation..  Plan  We will plan on seeing the patient in 3 months and obtain a CA 125 just prior to each of those visits. The patient continues to [facility] as. Her for constipation. She will schedule mammograms in the near future.    Interval History:  Patient returns as previously scheduled for followup. Since her last visit she's done well. She had a recent CA 125 value of 3.2 units per mL (previously was 4.8 units per mL). Patient seems to be doing well except for chronic constipation and some lower abdominal discomfort. . She has no GI or GU symptoms.  . She has no pelvic pain pressure vaginal discharge.     YHC:[name] [name] is a 63 year old gravida 2 para 2 who went through menopause at the age about 63 years of age. She never took any hormone replacement therapy. She states that for the past 6 months she's experienced some pelvic soreness in pressure. She has noted some increasing urinary frequency. With these symptoms she had a PET scan on October 9. It revealed significant soft tissue thickening seen throughout the greater omentum suspicious for intraperitoneal carcinoma. There is minimal ascites in the pelvis. it revealed the areas of omental and peritoneal nodularity were hypermetabolic. There is a dominant pelvic omental mass measuring 10.4 x 3.3 cm. There is a small omental implants in the left side of the abdomen measuring 1.1 cm. There were multiple pelvic foci That wereperitoneal based And hypermetabolic. These included the cul-de-sac. She has small volume of ascites. She underwent a CT-guided biopsy on July 24, 2012. It revealed metastatic carcinoma. By immunohistochemistry,  malignant cells were positive for WT 1, cytokeratin 7, estrogen receptor. There were negative for TTF-1, progesterone receptor, cytokeratin 20, CEA, CD X2, and S100. Overall, the histologic features were highly suggestive of a primary gynecologic origin. Her CA 125 on October 10 was elevated at 69.  She underwent exploratory laparotomy on 08/22/2012 findings of stage IIIC ovarian cancer. Patient was optimally debulked. Debulking, however, required a rectosigmoid resection as well as total omentectomy total nominal hysterectomy bilateral salpingo-oophorectomy. She was optimally debulked with only 5 mm nodules on the right diaphragm as residual disease.  SURGICAL FINDINGS: At exploratory laparotomy the infracolic omentum was nearly replaced with tumor. This measured approximately 12 x 16 cm. There were 5 mm nodules on the right diaphragm. The small bowel serosa and mesentery were normal. In the pelvis it appeared that the left ovary was a primary source. It was cystic and densely adherent to the sigmoid mesentery. The right ovary was approximately 4 cm in diameter and had multiple excrescences on it. There were extensive tumor implants on the bladder flap and involving the sigmoid mesentery and serosa, and posterior cul-de-sac. At the conclusion of the surgical procedure the only residual disease were 5 mm nodules on the right diaphragm. Chemotherapy was initiated in December 2013 using the dose dense regimen of carboplatin and Taxol   She received 6 cycles of carboplatin and Taxol the last administered on 02/06/2013. At that time her CA 125 is 5.6 units per mL.  Review of Systems:10 point review of systems is negative as noted above.  Vitals: Blood pressure 114/72, pulse 75, temperature 98.3 F (36.8 C), temperature source Oral, resp. rate 16, height 5\' 5"  (1.651 m), weight 128 lb 4.8 oz (58.196 kg).  Physical Exam: General : The patient is a healthy woman in no acute distress.  HEENT: normocephalic,  extraoccular movements normal; neck is supple without thyromegally  Lynphnodes: Supraclavicular and inguinal nodes not enlarged Breasts are without masses, skin changes, or nipple discharge. Axillary nodes are not enlarged.  Abdomen: Soft, non-tender, no ascites, no organomegally, no masses, no hernias  Pelvic:  EGBUS: Normal female  Vagina: Normal, no lesions no evidence of bleeding. Urethra and Bladder: Normal, non-tender  Cervix: Surgically absent  Uterus: Surgically absent   bi-manual examination: Non-tender; no adenxal masses or nodularity  Rectal: normal sphincter tone, no masses, no blood  Lower extremities: No edema or varicosities. Normal range of motion      Allergies  Allergen Reactions  . Atorvastatin     REACTION: causes leg pain  . Decongest-Aid [Pseudoephedrine]     Makes my heart flutter  . Macrodantin     Past Medical History  Diagnosis Date  . Rheumatoid arthritis(714.0)   . Endometrial polyp   . Cystocele with uterine descensus     AND RECTOCELE  . Elevated cholesterol   . Cancer     Ovarian    Past Surgical History  Procedure Laterality Date  . Hand surgery  1996  . Tubal ligation  1980  . Hysteroscopy  2004    D&C  . Dilation and curettage of uterus  2004    WITH HYSTEROSCOPY  . Neck surgery  2009    SPURS  . Cyst on neck  2011  . Abdominal hysterectomy  08/22/2012    Procedure: HYSTERECTOMY ABDOMINAL;  Surgeon: Alvino Chapel, MD;  Location: [facility];  Service: Gynecology;  Laterality: N/A;  . Salpingoophorectomy  08/22/2012    Procedure: SALPINGO OOPHORECTOMY;  Surgeon: Alvino Chapel, MD;  Location: [facility];  Service: Gynecology;  Laterality: Bilateral;  . Omentectomy  08/22/2012    Procedure: OMENTECTOMY;  Surgeon: Alvino Chapel, MD;  Location: [facility];  Service: Gynecology;  Laterality: N/A;  . Debulking  08/22/2012    Procedure: DEBULKING;  Surgeon: Alvino Chapel, MD;  Location: [facility];  Service:  Gynecology;  Laterality: N/A;  Radical tumor debulking, Bilateral Ureterolysis  . Colostomy revision  08/22/2012    Procedure: COLON RESECTION SIGMOID;  Surgeon: Alvino Chapel, MD;  Location: [facility];  Service: Gynecology;;  Rectal Sigmoid resection and low rectal anastomosis    Current Outpatient Prescriptions  Medication Sig Dispense Refill  . calcium-vitamin D (OSCAL WITH D) 500-200 MG-UNIT per tablet Take 1 tablet by mouth daily.        . diazepam (VALIUM) 5 MG tablet 2 mg. Take 1 tablet at 2:00 pm and 1-2 tablets at bedtime for sleep      . docusate sodium (COLACE) 100 MG capsule Take 100 mg by mouth daily.      [name] escitalopram (LEXAPRO) 20 MG tablet Take 30 mg by mouth daily. Takes at bedtime      . fish oil-omega-3 fatty acids 1000 MG capsule Take 1 g by mouth daily.        . Flaxseed, Linseed, (FLAX SEED OIL) 1000 MG CAPS Take 1 capsule by mouth daily.      . Multiple Vitamin (MULTIVITAMIN WITH MINERALS) TABS Take 1 tablet by mouth daily.      . polyethylene glycol (MIRALAX /  GLYCOLAX) packet Take 17 g by mouth daily as needed.      . pravastatin (PRAVACHOL) 40 MG tablet Take 1 tablet (40 mg total) by mouth daily.  90 tablet  3   No current facility-administered medications for this visit.   Facility-Administered Medications Ordered in Other Visits  Medication Dose Route Frequency Provider Last Rate Last Dose  . sodium chloride 0.9 % injection 10 mL  10 mL Intracatheter PRN Ladell Pier, MD   10 mL at 01/16/13 1359    History   Social History  . Marital Status: Married    Spouse Name: N/A    Number of Children: N/A  . Years of Education: N/A   Occupational History  . Not on file.   Social History Main Topics  . Smoking status: Never Smoker   . Smokeless tobacco: Not on file  . Alcohol Use: No  . Drug Use: No  . Sexual Activity: No   Other Topics Concern  . Not on file   Social History Narrative  . No narrative on file    Family History  Problem  Relation Age of Onset  . Heart disease Mother   . Osteoporosis Sister   . Bladder Cancer Father 51  . Lymphoma Paternal [place], MD 02/20/2014, 3:53 PM

## 2014-02-20 NOTE — Patient Instructions (Signed)
Return to see me in 3 months. 

## 2014-02-21 ENCOUNTER — Encounter: Payer: Self-pay | Admitting: Oncology

## 2014-02-21 ENCOUNTER — Telehealth: Payer: Self-pay | Admitting: *Deleted

## 2014-02-21 ENCOUNTER — Telehealth: Payer: Self-pay | Admitting: Pulmonary Disease

## 2014-02-21 NOTE — Telephone Encounter (Signed)
Called and spoke with pt and she stated that she received a letter from [facility] that stated that SN is no longer listed as primary care provider with them.  Pt stated that she has appt with SN on 6-16 and she is going to call [employer] to see if she can keep this appt this time with SN.  Pt to call back if she needs anything further.

## 2014-02-21 NOTE — Telephone Encounter (Signed)
Notified pt CA125 Normal. Pt verbalized understanding.

## 2014-02-21 NOTE — Progress Notes (Signed)
Faxed daughter's fmla form to [employer] @ [phone] °

## 2014-03-19 ENCOUNTER — Telehealth: Payer: Self-pay | Admitting: Pulmonary Disease

## 2014-03-19 ENCOUNTER — Ambulatory Visit: Payer: Medicare Other | Admitting: Pulmonary Disease

## 2014-03-19 ENCOUNTER — Encounter: Payer: Self-pay | Admitting: Pulmonary Disease

## 2014-03-19 ENCOUNTER — Other Ambulatory Visit (INDEPENDENT_AMBULATORY_CARE_PROVIDER_SITE_OTHER): Payer: Medicare Other

## 2014-03-19 ENCOUNTER — Ambulatory Visit (INDEPENDENT_AMBULATORY_CARE_PROVIDER_SITE_OTHER): Payer: Medicare Other | Admitting: Pulmonary Disease

## 2014-03-19 VITALS — BP 106/70 | HR 57 | Temp 97.6°F | Ht 66.0 in | Wt 139.2 lb

## 2014-03-19 DIAGNOSIS — R5383 Other fatigue: Secondary | ICD-10-CM

## 2014-03-19 DIAGNOSIS — R300 Dysuria: Secondary | ICD-10-CM

## 2014-03-19 DIAGNOSIS — M549 Dorsalgia, unspecified: Secondary | ICD-10-CM

## 2014-03-19 DIAGNOSIS — E7800 Pure hypercholesterolemia, unspecified: Secondary | ICD-10-CM

## 2014-03-19 DIAGNOSIS — Z8719 Personal history of other diseases of the digestive system: Secondary | ICD-10-CM

## 2014-03-19 DIAGNOSIS — M1990 Unspecified osteoarthritis, unspecified site: Secondary | ICD-10-CM

## 2014-03-19 DIAGNOSIS — R5381 Other malaise: Secondary | ICD-10-CM

## 2014-03-19 DIAGNOSIS — C569 Malignant neoplasm of unspecified ovary: Secondary | ICD-10-CM

## 2014-03-19 DIAGNOSIS — Z8679 Personal history of other diseases of the circulatory system: Secondary | ICD-10-CM

## 2014-03-19 LAB — URINALYSIS, ROUTINE W REFLEX MICROSCOPIC
Bilirubin Urine: NEGATIVE
Hgb urine dipstick: NEGATIVE
KETONES UR: NEGATIVE
Nitrite: NEGATIVE
PH: 7 (ref 5.0–8.0)
RBC / HPF: NONE SEEN (ref 0–?)
SPECIFIC GRAVITY, URINE: 1.02 (ref 1.000–1.030)
TOTAL PROTEIN, URINE-UPE24: NEGATIVE
URINE GLUCOSE: NEGATIVE
Urobilinogen, UA: 0.2 (ref 0.0–1.0)

## 2014-03-19 NOTE — Patient Instructions (Signed)
Today we updated your med list in our EPIC system...    Continue your current medications the same...  Today we did a Urinalysis to check for a urinary infection...    We will contact you w/ the results when available...   [facility]...  Call for any questions or if I can be of service in any way.Marland KitchenMarland Kitchen

## 2014-03-19 NOTE — Progress Notes (Signed)
Subjective:    Patient ID: [name] [name], female    DOB: June 09, 1951, 63 y.o.   MRN: [id number]  HPI 63 y/o WF here for a follow up visit...    ~  December 29, 2101:  4-28moROV & she appears quite anxious, rapid speech & somewhat loud; encouraged to take the Klonopin Bid regularly; she is concerned about her great toe nail- mycotic infection & offered Lamisil but she thinks it needs to come off & we will refer to Podiatry;  Also notes freq urination & wants Urinalysis & cult today...    MVP>  She notes occas self limited palpit on & off; denies CP/ angina etc; recall hx mother died suddenly in her 548'sw/ MI; we gave her a lot of reassurance & reviewed risk factor reduction strategy; avoid caffeine & take the Klonopin!    Chol>  On Prav40 now & [facility] w/ [id number]& [id number] she is also taking Fish Oil & Flax Seed Oil per her preference; I told her she may need a stonger statin if Prav40 + DIET don't get her closer to the goals...    IBS>  She denies abd pain, n/v/d/c/blood in stools etc; states she's doing satis w/o GI issues at present...    DJD/ neck pain, back pain>  She continues her f/u w/ DrAnderson for Rheum (she is on disability due to her arthritis per Rheum) & DrPool for NS; they manage her chronic pain as well & just on Naprosyn 2223mBid as needed now; vs OTC analgesics, plus her Oscal, etc...    Vit D defic>  On Vit D 5000u daily supplement but recently ran out; Vit D level is still on the low side at 35 today- rec continuing VitD 5000u OTC daily supplement...    Anxiety>  She still appears quite anxious; supposed to be taking Klonopin 0.50m72mid regularly for the palpit/ SOB/ etc; asked her to take Bid regularly so we could assess it's efficacy & see what her best dose will be...    DERM> onychomycosis would benefit from Lamisil in my opinion but she [facility] if the [facility] to [facility]iatry for their review...  CXR 3/13 showed stable  heart size, clear lungs, DJD in spine w/ neck hardware...  LABS 3/13 showed:  FLP- not at goals on Prav40;  Chems- wnl;  CBC- wnl;  TSH=1.77;  VitD=35;  UA- clear x few wbc & awaiting C&S...  ~  July 05, 2012:  10mo101mo & [name] [name] is c/o 63mo 67moower abd pain> off & on, noted worse if getting up or turning a certain way, noted it worse over bladder & saw GYN w/ [facility] showing fibroid but no acute changes & they want her to see GI, DrStark;  She notes some constip- hard, every 3-4d, not painful/ no blood;  uses Metamucil as needed;  We discussed further eval w/ CT Abd&Pelvis, treat w/ BENTYL 20mg 41mcramping, & refer to DrStark for colonoscopy...    Since last OV she has decided to stop the Prav40 & is using RYR instead "some woman told me about it"; she is not fasting for FLP today...     Similarly she has stopped her Klonopin "I don't need it, no problems!"... We reviewed prob list, meds, xrays and labs> see below for updates >> OK Flu vaccine today.  LABS 10/13:  Chems- wnl;  CBC- wnl;  Sed- 17...  ADDENDUM>>  CT Abd&Pelvis 07/12/12>> signif soft tissue caking throughout the omentum, min pelvic ascites, no adnexal mass or primary tumor identified, sm fibroids... This likely represents ovarian ca vs primary peritoneal carcinomatous> we will check Ca125, Ca19-9, & arrange for Bx by IR along w/ referral to Oncology...    ~  March 07, 2013:  7moROV & since she had her diagnosis of Ovarian Ca- stage lllc she has [facility] (optimum debulking surg required a rectosigmoid resection & total omentectomy, TAH, BSO) and ChemoRx from DrSherrill (w/ Carboplatin & Taxol) started 12/13 until 5/14 (her Ca125 has returned to normal 69=> 4.3=> 5.6... She has done well w/ the therapy but has had trouble w/ depression & her Psychiatrist- DrPoulas has adjusted her Lexapro up to 210md... We reviewed the following medical problems during today's office visit >>     MVP> notes occas self limited palpit  on & off; denies CP/ angina etc; recall hx mother died suddenly in her 5037's/ MI; we gave her a lot of reassurance & reviewed risk factor reduction strategy; avoid caffeine & take meds from DrPoulas...    Chol> on Prav40; FLP 6/14 shows TChol 206, TG 136, HDL 48, LDL 134... rec to continue same med for now...    IBS> on Miralax, Colase, Compazine; she had rectosigmoid resection as part of her Ovarian Ca surg; improved w/ occas discomfort & min constip- the Miralax helps...     Ovarian cancer w/ mets, stage IIIc> SEE ABOVE    DJD/ neck pain, back pain> followed by DrKirby Funkor Rheum (she is on disability from him) & DrPool for NS; they manage her chronic pain as well & taking Percocet5 prn...    Vit D defic> on Vit D 5000u daily supplement but recently ran out; Vit D level is still on the low side at 35- rec continuing VitD 5000u OTC daily supplement...    Anxiety> on VaAlbertashe has re-established w/ drPoulas for Psyche & getting counseling as well...    DERM> onychomycosis evaluated by Podiatry... We reviewed prob list, meds, xrays and labs> see below for updates >>   LABS 6/14:  FLP- improved on Prav40 but LDL=134; TSH=2.76;  VitD=36...  LABS 4/14 in Epic> reviewed:  Chems- wnl;  CBC- anemic w/ Hg=10.6, MCV=115, WBC=3.4;  Ca125=5.6  ~  September 11, 2013:  7m67moV & CPX> LinFelicitaturns and requests her full CPX today; she reports feeling weak, no energy & doesn't feel like doing anything; states her bones are aching, not feeling well, and has mult somatic complaints; she appear quite anxious and states that she stopped her Prav40 some time ago because she heard it can cause cancer; interestingly- she does not feel any better after stopping the statin... We reviewed the following medical problems during today's office visit >>     MVP> notes occas self limited palpit on & off; denies CP/ angina etc; recall hx mother died suddenly in her 50'60's MI; we gave her a lot of reassurance &  reviewed risk factor reduction strategy; avoid caffeine & take meds from DrPoulas...    Chol> off Prav40 (she stopped on her own "I heard it causes cancer"); FLP 12/14 on diet alone shows TChol 228, TG 174, HDL 47, LDL 158; offered to monitor on diet/ exercise vs start Zetia10...     IBS, constipation> on Miralax, Colase; she had rectosigmoid resection as part of her Ovarian Ca surg; improved w/ occas discomfort & min constip- the  Miralax helps...     Ovarian cancer w/ mets, stage IIIc> SEE ABOVE> Ovarian Ca- stage lllc w/ surg [facility] 11/13 (optimum debulking surg required a rectosigmoid resection & total omentectomy, TAH, BSO) and ChemoRx from DrSherrill (w/ Carboplatin & Taxol) started 12/13 until 5/14 (her Ca125 has returned to normal 69=> 4.6-4.8 range... Last CT scans 6/14 showed no residual or metastatic dis; her central line was removed 7/14; last saw [facility] 9/14- clinically free of disease...     DJD/ neck pain, back pain> followed by Kirby Funk for Rheum (she is on disability from him) & DrPool for NS; they manage her chronic pain as well & taking Percocet5 prn...    Vit D defic> on Vit D 5000u daily supplement but recently ran out; Vit D level is still on the low side at 35- rec continuing VitD 5000u OTC daily supplement...    Anxiety/Depression> on Valium5 prn & Lexapro20-1.5tabs daily; she has re-established w/ LisaPoulas for Psyche counseling as well...    DERM> onychomycosis evaluated by Podiatry... We reviewed prob list, meds, xrays and labs> see below for updates >> OK 2014 flu vaccine today...  CXR 12/14 showed norm heart size, clear lungs, Cspine hardware, NAD.Marland KitchenMarland Kitchen   EKG 12/14 showed NSR, rate60, NSSTTWA...  LABS 12/14:  FLP- not at goals off Prav40, LDL=158;  Chems- wnl;  CBC- wnl;  TSH=2.70;  VitD=40;  Ca125=4.8.Marland KitchenMarland Kitchen  ~  March 19, 2014:  62moROV & [name] [name] continues her regular f/u w/ DrSherrill & DrClarke-Pearson Q360monotes reviewed- last Ca125=3.2); she reports doing  well, energy fair, no new complaints or concerns=> she does note some lower abd & lower back pain, wants a Urinalysis done (UA- clear)...     On Prav40 + flax seed oil; last FLP 12/14 was done on diet alone and not at goals; she didn't want statin restarted & tried Zetia10 but too expensive & ultimately back on Prav40 + diet...    Hx constipation & she takes Miralax & Colase regularly w/ normal regularity...    Hx anxiety/ depression on Lexapro & Valium (prn) We reviewed prob list, meds, xrays and labs> see below for updates >> she is going to establish Primary care w/ Joshua- DrPaz...            Problem List:  CHEST WALL PAIN, HX OF (ICD-V15.89),  MITRAL VALVE PROLAPSE, HX OF (ICD-V12.50) -  prev cardiac eval by DrNishan in 2005 due to coronary risk factors: pt's mother died suddenly in her 5078's/ MI... denies CP, palipit, dizziness, syncope, dyspnea, edema, etc...  ~  2DEcho 12/05 showed normal x ?HK inferiorly, EF= 55-60%... ~  NuclearStressTest 12/05 showed normal- no ischemia, no scar, EF= 65%... no change from 2003. ~  EKG= NSR, WNL...[name]~  CXR 9/10 & 9/11 showed NAD...[name]~  CXR 3/13 showed stable heart size, clear lungs, DJD in spine w/ neck hardware...  ~  CXR 11/13 showed normal heart size, clear lungs, prior Cspine fusion. ~  CXR 12/14 showed norm heart size, clear lungs, Cspine hardware, NAD...   HYPERCHOLESTEROLEMIA (ICD-272.0) - prev followed in the lipid clinic... she was on Lip20 but stopped ~6/11 due to leg cramps. ~  FLP 8/08 showed TChol 224, TG 69, HDL 54, LDL 162... this was off meds- statin restarted. ~  FLTimberlane/09 on Lip20 showed TChol 195, TG 100, HDL 65, LDL 110... improved- needs better diet. ~  FLP 9/10 on Lip20 showed TChol 198, TG 104, HDL 61, LDL 116... she stopped ~6/11 due  to leg cramps. ~  FLP 9/11 on diet alone showed TChol 334, TG 170, HDL 60, LDL 238... needs ret to [facility]. ~  FLP 11/11 on Prav20 showed TChol 212, TG 111, HDL 54, LDL 129 ~  FLP 3/12 on  Prav20 showed TChol 222, TG 126, HDL 57, LDL 153 ~  FLP 9/12 on Prav20 showed TChol 266, TG 138, HDL 62, LDL 184... Needs to incr Prav40 & f/u LC. ~  FLP 11/12 on Prav40 showed TChol 210, TG 136, HDL 57, LDL 141... She tells me that the [facility] told her to f/u here; Continue Prav40 & diet Rx. ~  FLP 3/13 on Prav40 showed TChol 217, TG 143, HDL 62, LDL 143... Told she may need stronger statin if Prav40 + diet don't work better... ~  10/13:  She reports stopping the Prav40 & has started RYR- "some woman told me about it"... ~  [place] 6/14 on Prav40 showed TChol 206, TG 136, HDL 48, LDL 134... Continue same for now. ~  12/14: off Prav40 (she stopped on her own "I heard it causes cancer"); FLP 12/14 on diet alone shows TChol 228, TG 174, HDL 47, LDL 158; offered to monitor on diet/ exercise vs start Zetia10.  IRRITABLE BOWEL SYNDROME, HX OF (ICD-V12.79) - last colonoscopy 7/05 by DrStark was WNL- no divertics, no polyps, etc... f/u planned 83yr... ~  10/13: she presented w/ lower abd discomfort > see above & we decided to check CT Abd&Pelvis- pending. ~  11/13: she had ELap & surg for Ovarian Ca- optimal debulking, TAH, BSO, & required rectosigmoid resection... ~  12/14: on Miralax, Colase; she had rectosigmoid resection as part of her Ovarian Ca surg; improved w/ occas discomfort & min constip- the Miralax helps.  OVARIAN CANCER >>  ~  CT Abd&Pelvis 07/12/12>> signif soft tissue caking throughout the omentum, min pelvic ascites, no adnexal mass or primary tumor identified, sm fibroids... This likely represents ovarian ca vs primary peritoneal carcinomatous> we will check Ca125, Ca19-9, & arrange for Bx by IR along w/ referral to Oncology. ~  Ovarian [facility] stage lllc [facility] surg required a rectosigmoid resection & total omentectomy, TAH, BSO) and ChemoRx from DrSherrill (w/ Carboplatin & Taxol) started 12/13 until 5/14 (her Ca125 has returned to normal 69=>  4.6-4.8 range... ~  [facility]... ~  CT Chest, Abd, Pelvis 6/14 showed no acute process or evid of mets in chest; no resid/ measurable dis left in the omental/ peritoneal area, s/p hyst & BSO, DDD in lumbar spine...  DEGENERATIVE JOINT DISEASE (ICD-715.90),  NECK PAIN (ICD-723.1),  BACK PAIN (ICD-724.5) >>  She has a severe prob w/ neck pain and LBP... s/p MRI scans and prev Lumbar Lam in 2006 by DrPool... f/u myelogram and CT w/ bone spurs in neck and arthritis in her lower back... s/p ant cerv discectomy & fusion C6-7 for stenosis w/ myelopathy per DrPool... prev followed by DrBarker in Pain Management but now relying on DLimestone.. she also sees DrKuzma for Ortho w/ severe DJD of hands, Rx w/ Dosepak and Voltaren- min benefit & he has rec surgery but she is holding off & seeing DCitrus Hillsfor Rheum as noted... ~  9/11:  she notes [facility]. ~  9/12:  She maintains f/u w/ DKirby Funkfor Rheum & DrPool for Neurosurg; she indicates recent MRI spine done, we don't have recent notes..Marland KitchenMarland Kitchen  VITAMIN D DEFICIENCY (ICD-268.9) - Vit D level 8/09 was 14 & she was started on 50,000 u caps weekly, then switched to 5000 u daily on her own...   ~  labs 9/10 showed Vit D level = 35... OK to continue the 5000 u daily for now. ~  labs 9/11 showed Vit D level = 39... rec to continue 5000 u daily... ~  Labs 3/13 showed Vit D level = 35... States she recently ran out of Vit D; rec to continue 5000u daily... ~  Labs 12/14 showed Vit D level = 40 and rec to continue OTC supplements...  ANXIETY DISORDER (ICD-300.00) - prev seen by psyche DrLisaPoulus & was on Celexa & Prosom... not currently on medications for nerves... ~  3/12:  We tried to add KLONOPIN 0.73m Bid but she never took it... ~  11/12:  Pt c/o incr palpit esp when quiet at night; rec to restart KLONOPIN 0.580mBid regularly... ~  3/13:  rec to take Klonopin  0.80m21mid regularly or consider re-establish w/ LisaPoulis... ~  10/13: she stopped the Klonopin & never re-established w/ LisaPoulis... ~  6/14: she has re-established w/ LisDonata Clayon ValLoranew... ~  12/14: she notes Lexapro increased to 1.5tabs (41m59mer day; she is still extremely anxious...  Health Maintenance - GYN= DrGottsegen w/ f/u PAP & Mammogram due now... she had prev BMD ?where... she was laid off work at [employer] she had been for 23yr52yrworked for [employer]yrs,36yr on disability due to her arthritis...   Past Surgical History  Procedure Laterality Date  . Hand surgery  1996  . Tubal ligation  1980  . Hysteroscopy  2004    D&C  . Dilation and curettage of uterus  2004    WITH HYSTEROSCOPY  . Neck surgery  2009    SPURS  . Cyst on neck  2011  . Abdominal hysterectomy  08/22/2012    Procedure: HYSTERECTOMY ABDOMINAL;  Surgeon: DanielAlvino Chapel Location: [facility];  Service: Gynecology;  Laterality: N/A;  . Salpingoophorectomy  08/22/2012    Procedure: SALPINGO OOPHORECTOMY;  Surgeon: DanielAlvino Chapel Location: [facility];  Service: Gynecology;  Laterality: Bilateral;  . Omentectomy  08/22/2012    Procedure: OMENTECTOMY;  Surgeon: DanielAlvino Chapel Location: [facility];  Service: Gynecology;  Laterality: N/A;  . Debulking  08/22/2012    Procedure: DEBULKING;  Surgeon: DanielAlvino Chapel Location: [facility];  Service: Gynecology;  Laterality: N/A;  Radical tumor debulking, Bilateral Ureterolysis  . Colostomy revision  08/22/2012    Procedure: COLON RESECTION SIGMOID;  Surgeon: DanielAlvino Chapel Location: [facility];  Service: Gynecology;;  Rectal Sigmoid resection and low rectal anastomosis     Outpatient Encounter Prescriptions as of 03/19/2014  Medication Sig  . calcium-vitamin D (OSCAL WITH D) 500-200 MG-UNIT per tablet Take 1 tablet by mouth daily.    . diazepam (VALIUM) 5 MG  tablet 2 mg. Take 1 tablet at 2:00 pm and 1-2 tablets at bedtime for sleep  . docusate sodium (COLACE) 100 MG capsule Take 100 mg by mouth daily.  . esciMarland Kitchenalopram (LEXAPRO) 20 MG tablet Take 30 mg by mouth daily. Takes at bedtime  . fish oil-omega-3 fatty acids 1000 MG capsule Take 1 g by mouth daily.    . Flaxseed, Linseed, (FLAX SEED OIL) 1000 MG CAPS Take 1 capsule by mouth daily.  . Multiple  Vitamin (MULTIVITAMIN WITH MINERALS) TABS Take 1 tablet by mouth daily.  . polyethylene glycol (MIRALAX / GLYCOLAX) packet Take 17 g by mouth daily as needed.  . pravastatin (PRAVACHOL) 40 MG tablet Take 1 tablet (40 mg total) by mouth daily.    Allergies  Allergen Reactions  . Atorvastatin     REACTION: causes leg pain  . Decongest-Aid [Pseudoephedrine]     Makes my heart flutter  . Macrodantin     Current Medications, Allergies, Past Medical History, Past Surgical History, Family History, and Social History were reviewed in [employer] record.   Review of Systems         See HPI - all other systems neg except as noted... The patient complains of difficulty walking.  The patient denies anorexia, fever, weight loss, weight gain, vision loss, decreased hearing, hoarseness, chest pain, syncope, dyspnea on exertion, peripheral edema, prolonged cough, headaches, hemoptysis, abdominal pain, melena, hematochezia, severe indigestion/heartburn, hematuria, incontinence, muscle weakness, suspicious skin lesions, transient blindness, depression, unusual weight change, abnormal bleeding, enlarged lymph nodes, and angioedema.     Objective:   Physical Exam     WD, WN, 63 y/o WF in NAD... GENERAL:  Alert & oriented; pleasant & cooperative... HEENT:  [place]/AT, EOM-wnl, PERRLA, EACs-clear, TMs-wnl, NOSE-clear, THROAT-clear & wnl. NECK:  Supple w/ decrROM; scar of ant cerv discectomy; no JVD; normal carotid impulses w/o bruits;  no thyromegaly or nodules palpated; no  lymphadenopathy. CHEST:  Clear to P & A; without wheezes/ rales/ or rhonchi. HEART:  Regular Rhythm; without murmurs/ rubs/ or gallops. ABDOMEN:  Soft & nontender; normal bowel sounds; no organomegaly or masses detected. EXT: +heberdens & bouchards nodes;  no varicose veins/ venous insuffic/ or edema. NEURO:  CN's intact; motor testing normal; sensory testing normal; gait normal & balance OK. DERM:  No lesions noted; no rash etc...  RADIOLOGY DATA:  Reviewed in the EPIC EMR & discussed w/ the patient...  LABORATORY DATA:  Reviewed in the EPIC EMR & discussed w/ the patient...   Assessment & Plan:    OVARIAN CANCER> s/p Surg & ChemoRx as above=> followed by DrClark-Pearson every 46mo& DrSherrill...   MVP/ Palpit>  She notes occas self limited palpit on & off; denies CP/ angina etc...     Chol>  Off prev Prav40 due to side effects and she heard it causes cancer; offered monitoring on diet alone vs non-statin rx w/ [id number] she will decide...     IBS>  Symptoms improved; she required rectosigmoid resection as part of her Ovarian Ca surg 11/13; uses Miralax for constip...     DJD/ neck pain, back pain>  She continues her f/u w/ DrAnderson for Rheum & DrPool for NS; they manage her chronic pain as well & just on Naprosyn 2222mBid as needed now; plus her Oscal, etc...    Vit D defic>  On Vit D supplement; Vit D level is 40 & rec to continue same for now...     Anxiety> she has re-established w/ LisaPoulis- on Valium5 & Lexapro20- now up to 3076m...  DERM> mycotically infected great toenail, prev referred to podiatry...Marland KitchenMarland Kitchen

## 2014-03-19 NOTE — Telephone Encounter (Signed)
Pt advised to go ahead and fast for today's appt with SN--pt not sure if she will be having fasting labs or not.  I was unable to ask Dr Lenna Gilford as he was in a room with a patient. Pt expressed understanding--will go ahead and FAST  Nothing further needed.

## 2014-03-20 ENCOUNTER — Telehealth: Payer: Self-pay | Admitting: Pulmonary Disease

## 2014-03-20 LAB — URINE CULTURE: Colony Count: 15000

## 2014-03-20 NOTE — Telephone Encounter (Signed)
Called amde tp aware culture results are not available yet. Nothing further needed

## 2014-03-21 ENCOUNTER — Telehealth: Payer: Self-pay | Admitting: Pulmonary Disease

## 2014-03-21 NOTE — Telephone Encounter (Signed)
Per SN---  Urine culture----negative.    Called and spoke with pt and she is aware of lab results per SN.  Nothing further is needed.

## 2014-03-21 NOTE — Telephone Encounter (Signed)
I called spoke with pt. Made her aware we are awaiting final report. Pt aware will call her tomorrow with final results. Please advise leigh once Sn reviews results thanks

## 2014-03-25 ENCOUNTER — Encounter: Payer: Self-pay | Admitting: Gastroenterology

## 2014-03-28 ENCOUNTER — Telehealth: Payer: Self-pay | Admitting: *Deleted

## 2014-03-28 NOTE — Telephone Encounter (Signed)
Medication List and Allergies: Reviewed and updated   Local pharmacy:  [facility] Immunizations Due:  A/P FH/PSH or Personal History: Reviewed and updated Flu Vac: 09/2013 Tdap: Never PNA: Never                 Shingles: Never CCS: 06/2004 normal, scheduled again 06/2014 MMG: 09/2013 normal--> 1 year BD:  06/2009 normal --> 2-5 years  To discuss with provider: Not at this time.

## 2014-03-29 ENCOUNTER — Ambulatory Visit (INDEPENDENT_AMBULATORY_CARE_PROVIDER_SITE_OTHER): Payer: Medicare Other | Admitting: Internal Medicine

## 2014-03-29 ENCOUNTER — Encounter: Payer: Self-pay | Admitting: Internal Medicine

## 2014-03-29 VITALS — BP 101/68 | HR 90 | Temp 98.0°F | Ht 65.5 in | Wt 129.0 lb

## 2014-03-29 DIAGNOSIS — E7800 Pure hypercholesterolemia, unspecified: Secondary | ICD-10-CM

## 2014-03-29 DIAGNOSIS — C569 Malignant neoplasm of unspecified ovary: Secondary | ICD-10-CM

## 2014-03-29 DIAGNOSIS — F32A Depression, unspecified: Secondary | ICD-10-CM

## 2014-03-29 DIAGNOSIS — E559 Vitamin D deficiency, unspecified: Secondary | ICD-10-CM

## 2014-03-29 DIAGNOSIS — F329 Major depressive disorder, single episode, unspecified: Secondary | ICD-10-CM

## 2014-03-29 DIAGNOSIS — M069 Rheumatoid arthritis, unspecified: Secondary | ICD-10-CM

## 2014-03-29 DIAGNOSIS — F341 Dysthymic disorder: Secondary | ICD-10-CM

## 2014-03-29 DIAGNOSIS — F419 Anxiety disorder, unspecified: Secondary | ICD-10-CM

## 2014-03-29 NOTE — Assessment & Plan Note (Signed)
stage IIIc ovarian cancer (optimally debulked. Status post 6 cycles of carboplatin and Taxol given in a dose dense regimen)  Seems to be doing very well, followed by Dr. Blair Promise and Dr. Fermin Schwab

## 2014-03-29 NOTE — Assessment & Plan Note (Signed)
His of rheumatoid tried his, mostly affecting her hands, currently on no specific treatment

## 2014-03-29 NOTE — Progress Notes (Signed)
Pre visit review using our clinic review tool, if applicable. No additional management support is needed unless otherwise documented below in the visit note. 

## 2014-03-29 NOTE — Progress Notes (Signed)
Subjective:    Patient ID: [name] [name], female    DOB: 12-27-1950, 63 y.o.   MRN: [id number]  DOS:  03/29/2014 Type of  Visit: Patient, transferring from Dr. Lenna Gilford History: In general feeling well, chart and appropriate medical records were reviewed History of ovarian cancer, closely followed by Dr. Blair Promise and  gynecology oncology. Depression anxiety, symptoms developed after she was diagnosed with ovarian cancer, follow up at another office. High cholesterol, last FLP showed elevated cholesterol in December 2014, she went back on Pravachol, good compliance and tolerance. Due for labs.  ROS Denies chest pain or difficulty breathing. Occasionally has lower abdominal discomfort since extensive surgery No nausea, vomiting, diarrhea.   Past Medical History  Diagnosis Date  . Rheumatoid arthritis(714.0) ~ 2010  . Endometrial polyp   . Elevated cholesterol   . Ovarian ca 07-2012       . DJD (degenerative joint disease)   . IRRITABLE BOWEL SYNDROME, HX OF 05/15/2008    Qualifier: Diagnosis of  By: Julien Girt CMA, Marliss Czar    . Anxiety and depression    . MVP (mitral valve prolapse)     occ palpitations     Past Surgical History  Procedure Laterality Date  . Hand surgery  1996  . Tubal ligation  1980  . Hysteroscopy  2004    D&C  . Dilation and curettage of uterus  2004    WITH HYSTEROSCOPY  . Neck surgery  2009    SPURS  . Cyst on neck  2011  . Abdominal hysterectomy  08/22/2012    Procedure: HYSTERECTOMY ABDOMINAL;  Surgeon: Alvino Chapel, MD;  Location: [facility];  Service: Gynecology;  Laterality: N/A;  . Salpingoophorectomy  08/22/2012    Procedure: SALPINGO OOPHORECTOMY;  Surgeon: Alvino Chapel, MD;  Location: [facility];  Service: Gynecology;  Laterality: Bilateral;  . Omentectomy  08/22/2012    Procedure: OMENTECTOMY;  Surgeon: Alvino Chapel, MD;  Location: [facility];  Service: Gynecology;  Laterality: N/A;  . Debulking  08/22/2012    Procedure:  DEBULKING;  Surgeon: Alvino Chapel, MD;  Location: [facility];  Service: Gynecology;  Laterality: N/A;  Radical tumor debulking, Bilateral Ureterolysis  . Colostomy revision  08/22/2012    Procedure: COLON RESECTION SIGMOID;  Surgeon: Alvino Chapel, MD;  Location: [facility];  Service: Gynecology;;  Rectal Sigmoid resection and low rectal anastomosis  . Back surgery  2008    Dr Trenton Gammon    History   Social History  . Marital Status: Married    Spouse Name: N/A    Number of Children: 2  . Years of Education: N/A   Occupational History  . disability d/t RA    Social History Main Topics  . Smoking status: Never Smoker   . Smokeless tobacco: Never Used  . Alcohol Use: No  . Drug Use: No  . Sexual Activity: No   Other Topics Concern  . Not on file   Social History Narrative   [name] [name] is her daughter         Medication List       This list is accurate as of: 03/29/14 11:59 PM.  Always use your most recent med list.               calcium-vitamin D 500-200 MG-UNIT per tablet  Commonly known as:  OSCAL WITH D  Take 1 tablet by mouth daily.     diazepam 5 MG tablet  Commonly known as:  VALIUM  2 mg. Take 1 tablet at 2:00 pm and 1-2 tablets at bedtime for sleep     docusate sodium 100 MG capsule  Commonly known as:  COLACE  Take 100 mg by mouth daily.     escitalopram 20 MG tablet  Commonly known as:  LEXAPRO  Take 20 mg by mouth daily. Take one and 1/2 tablet in the morning     estazolam 2 MG tablet  Commonly known as:  PROSOM  Take 2 mg by mouth at bedtime.     fish oil-omega-3 fatty acids 1000 MG capsule  Take 1 g by mouth daily.     Flax Seed Oil 1000 MG Caps  Take 1 capsule by mouth daily.     multivitamin with minerals Tabs tablet  Take 1 tablet by mouth daily.     polyethylene glycol packet  Commonly known as:  MIRALAX / GLYCOLAX  Take 17 g by mouth daily as needed.     pravastatin 40 MG tablet  Commonly known as:  PRAVACHOL    Take 1 tablet (40 mg total) by mouth daily.           Objective:   Physical Exam BP 101/68  Pulse 90  Temp(Src) 98 F (36.7 C)  Ht 5' 5.5" (1.664 m)  Wt 129 lb (58.514 kg)  BMI 21.13 kg/m2  SpO2 98%  General -- alert, well-developed, NAD.  Neck --no thyromegaly  HEENT-- Not pale.   Lungs -- normal respiratory effort, no intercostal retractions, no accessory muscle use, and normal breath sounds.  Heart-- normal rate, regular rhythm, no murmur.  Extremities-- no pretibial edema bilaterally  Neurologic--  alert & oriented X3. Speech normal, gait appropriate for age, strength symmetric and appropriate for age.  Psych-- Cognition and judgment appear intact. Cooperative with normal attention span and concentration. slt anxious but no depressed appearing.       Assessment & Plan:

## 2014-03-29 NOTE — Assessment & Plan Note (Signed)
Based on the last cholesterol panel 12/14 she was restarted on Pravachol, good  compliance, no apparent side effects. Plan: Check FLP, AST, ALT

## 2014-03-29 NOTE — Assessment & Plan Note (Signed)
Patient reports symptoms started after she was diagnosed with ovarian cancer, sees Liza Poulus. Symptoms well-controlled for the most part, she did appear slightly anxious today

## 2014-03-29 NOTE — Patient Instructions (Signed)
Please come back next week fasting for labs: FLP AST ALT-- dx hyperlipidemia   Next visit is for a physical exam by 09-2014  fasting Please make an appointment

## 2014-03-31 NOTE — Assessment & Plan Note (Signed)
Diagnosed in 2009, subsequent vitamin D levels okay

## 2014-04-03 ENCOUNTER — Other Ambulatory Visit (INDEPENDENT_AMBULATORY_CARE_PROVIDER_SITE_OTHER): Payer: Medicare Other

## 2014-04-03 DIAGNOSIS — E785 Hyperlipidemia, unspecified: Secondary | ICD-10-CM

## 2014-04-03 LAB — LIPID PANEL
CHOL/HDL RATIO: 4
CHOLESTEROL: 240 mg/dL — AB (ref 0–200)
HDL: 58 mg/dL (ref >39.00)
LDL Cholesterol: 154 mg/dL — ABNORMAL HIGH (ref 0–99)
NonHDL: 182
TRIGLYCERIDES: 142 mg/dL (ref 0.0–149.0)
VLDL: 28.4 mg/dL (ref 0.0–40.0)

## 2014-04-03 LAB — AST: AST: 24 U/L (ref 0–37)

## 2014-04-03 LAB — ALT: ALT: 28 U/L (ref 0–35)

## 2014-04-04 ENCOUNTER — Telehealth: Payer: Self-pay | Admitting: *Deleted

## 2014-04-04 NOTE — Telephone Encounter (Signed)
Pt notified of lab results. Pt states does not want new medication zetia. Pt states that she has not been taking the pravastatin consistently or eating well . Pt states will try harder with diet and exercise. As well as taking the pravastatin as directed.

## 2014-04-05 NOTE — Telephone Encounter (Signed)
Agree, thanks

## 2014-04-12 ENCOUNTER — Telehealth: Payer: Self-pay | Admitting: Internal Medicine

## 2014-04-12 ENCOUNTER — Ambulatory Visit (INDEPENDENT_AMBULATORY_CARE_PROVIDER_SITE_OTHER): Payer: Medicare Other | Admitting: Adult Health

## 2014-04-12 ENCOUNTER — Encounter: Payer: Self-pay | Admitting: Adult Health

## 2014-04-12 VITALS — BP 101/69 | HR 89 | Temp 98.2°F | Resp 14 | Wt 131.8 lb

## 2014-04-12 DIAGNOSIS — R21 Rash and other nonspecific skin eruption: Secondary | ICD-10-CM

## 2014-04-12 DIAGNOSIS — R238 Other skin changes: Secondary | ICD-10-CM

## 2014-04-12 MED ORDER — VALACYCLOVIR HCL 1 G PO TABS
1000.0000 mg | ORAL_TABLET | Freq: Three times a day (TID) | ORAL | Status: DC
Start: 2014-04-12 — End: 2014-05-06

## 2014-04-12 NOTE — Telephone Encounter (Signed)
Pt went to pharmacy, they advised her to go straight to the Dr, telling her she has shingles.

## 2014-04-12 NOTE — Telephone Encounter (Signed)
Caller name: Shadoe Relation to pt: Call back [phone] or [phone]   Reason for call:  Pt is breaking out on stomach and going around to back now.  Itches and sometimes a little pain. Wants to know what the symptoms of shingles to see if she has it.

## 2014-04-12 NOTE — Telephone Encounter (Signed)
thx

## 2014-04-12 NOTE — Patient Instructions (Signed)
Shingles °Shingles (herpes zoster) is an infection that is caused by the same virus that causes chickenpox (varicella). The infection causes a painful skin rash and fluid-filled blisters, which eventually break open, crust over, and heal. It may occur in any area of the body, but it usually affects only one side of the body or face. The pain of shingles usually lasts about 1 month. However, some people with shingles may develop long-term (chronic) pain in the affected area of the body. °Shingles often occurs many years after the person had chickenpox. It is more common: °· In people older than 50 years. °· In people with weakened immune systems, such as those with HIV, AIDS, or cancer. °· In people taking medicines that weaken the immune system, such as transplant medicines. °· In people under great stress. °CAUSES  °Shingles is caused by the varicella zoster virus (VZV), which also causes chickenpox. After a person is infected with the virus, it can remain in the person's body for years in an inactive state (dormant). To cause shingles, the virus reactivates and breaks out as an infection in a nerve root. °The virus can be spread from person to person (contagious) through contact with open blisters of the shingles rash. It will only spread to people who have not had chickenpox. When these people are exposed to the virus, they may develop chickenpox. They will not develop shingles. Once the blisters scab over, the person is no longer contagious and cannot spread the virus to others. °SYMPTOMS  °Shingles shows up in stages. The initial symptoms may be pain, itching, and tingling in an area of the skin. This pain is usually described as burning, stabbing, or throbbing. In a few days or weeks, a painful red rash will appear in the area where the pain, itching, and tingling were felt. The rash is usually on one side of the body in a band or belt-like pattern. Then, the rash usually turns into fluid-filled blisters. They  will scab over and dry up in approximately 2-3 weeks. °Flu-like symptoms may also occur with the initial symptoms, the rash, or the blisters. These may include: °· Fever. °· Chills. °· Headache. °· Upset stomach. °DIAGNOSIS  °Your caregiver will perform a skin exam to diagnose shingles. Skin scrapings or fluid samples may also be taken from the blisters. This sample will be examined under a microscope or sent to a lab for further testing. °TREATMENT  °There is no specific cure for shingles. Your caregiver will likely prescribe medicines to help you manage the pain, recover faster, and avoid long-term problems. This may include antiviral drugs, anti-inflammatory drugs, and pain medicines. °HOME CARE INSTRUCTIONS  °· Take a cool bath or apply cool compresses to the area of the rash or blisters as directed. This may help with the pain and itching.   °· Only take over-the-counter or prescription medicines as directed by your caregiver.   °· Rest as directed by your caregiver. °· Keep your rash and blisters clean with mild soap and cool water or as directed by your caregiver.  °· Do not pick your blisters or scratch your rash. Apply an anti-itch cream or numbing creams to the affected area as directed by your caregiver. °· Keep your shingles rash covered with a loose bandage (dressing). °· Avoid skin contact with: °¨ Babies.   °¨ Pregnant women.   °¨ Children with eczema.   °¨ Elderly people with transplants.   °¨ People with chronic illnesses, such as leukemia or AIDS.   °· Wear loose-fitting clothing to help ease the   pain of material rubbing against the rash. °· Keep all follow-up appointments with your caregiver. If the area involved is on your face, you may receive a referral for follow-up to a specialist, such as an eye doctor (ophthalmologist) or an ear, nose, and throat (ENT) doctor. Keeping all follow-up appointments will help you avoid eye complications, chronic pain, or disability.   °SEEK IMMEDIATE MEDICAL  CARE IF:  °· You have facial pain, pain around the eye area, or loss of feeling on one side of your face. °· You have ear pain or ringing in your ear. °· You have loss of taste. °· Your pain is not relieved with prescribed medicines.   °· Your redness or swelling spreads.   °· You have more pain and swelling.  °· Your condition is worsening or has changed.   °· You have a fever or persistent symptoms for more than 2-3 days. °· You have a fever and your symptoms suddenly get worse. °MAKE SURE YOU: °· Understand these instructions. °· Will watch your condition. °· Will get help right away if you are not doing well or get worse. °Document Released: 09/20/2005 Document Revised: 06/14/2012 Document Reviewed: 05/04/2012 °[employer]® Patient Information ©2015 [employer]. This information is not intended to replace advice given to you by your health care provider. Make sure you discuss any questions you have with your health care provider. ° °

## 2014-04-12 NOTE — Progress Notes (Signed)
Pre visit review using our clinic review tool, if applicable. No additional management support is needed unless otherwise documented below in the visit note. 

## 2014-04-12 NOTE — Progress Notes (Signed)
Patient ID: [name] [name], female   DOB: May 18, 1951, 63 y.o.   MRN: [id number]   Subjective:    Patient ID: [name] [name], female    DOB: 27-Feb-1951, 63 y.o.   MRN: [id number]  HPI  Pt is a pleasant 63 y/o female who presents to clinic with concerns that she may have shingles. Reports seeing a small rash develop on the right side of her abdomen. She then noticed that the rash broke out on the right side of her back. This rash is painful. Believes it may have started around Tuesday or a little later.  Past Medical History  Diagnosis Date  . Rheumatoid arthritis(714.0) ~ 2010  . Endometrial polyp   . Elevated cholesterol   . Ovarian ca 07-2012       . DJD (degenerative joint disease)   . IRRITABLE BOWEL SYNDROME, HX OF 05/15/2008    Qualifier: Diagnosis of  By: Julien Girt CMA, Marliss Czar    . Anxiety and depression    . MVP (mitral valve prolapse)     occ palpitations     Current Outpatient Prescriptions on File Prior to Visit  Medication Sig Dispense Refill  . calcium-vitamin D (OSCAL WITH D) 500-200 MG-UNIT per tablet Take 1 tablet by mouth daily.        . diazepam (VALIUM) 5 MG tablet 2 mg. Take 1 tablet at 2:00 pm and 1-2 tablets at bedtime for sleep      . docusate sodium (COLACE) 100 MG capsule Take 100 mg by mouth daily.      [name] escitalopram (LEXAPRO) 20 MG tablet Take 20 mg by mouth daily. Take one and 1/2 tablet in the morning      . estazolam (PROSOM) 2 MG tablet Take 2 mg by mouth at bedtime.      . fish oil-omega-3 fatty acids 1000 MG capsule Take 1 g by mouth daily.        . Flaxseed, Linseed, (FLAX SEED OIL) 1000 MG CAPS Take 1 capsule by mouth daily.      . Multiple Vitamin (MULTIVITAMIN WITH MINERALS) TABS Take 1 tablet by mouth daily.      . polyethylene glycol (MIRALAX / GLYCOLAX) packet Take 17 g by mouth daily as needed.      . pravastatin (PRAVACHOL) 40 MG tablet Take 1 tablet (40 mg total) by mouth daily.  90 tablet  3   Current Facility-Administered Medications on File  Prior to Visit  Medication Dose Route Frequency Provider Last Rate Last Dose  . sodium chloride 0.9 % injection 10 mL  10 mL Intracatheter PRN Ladell Pier, MD   10 mL at 01/16/13 1359     Review of Systems  Constitutional: Negative for fever and chills.  HENT: Negative.   Respiratory: Negative.   Cardiovascular: Negative.   Skin: Positive for rash.  All other systems reviewed and are negative.      Objective:  BP 101/69  Pulse 89  Temp(Src) 98.2 F (36.8 C) (Oral)  Resp 14  Wt 131 lb 12 oz (59.761 kg)  SpO2 98%   Physical Exam  Constitutional: She is oriented to person, place, and time. No distress.  Cardiovascular: Normal rate and regular rhythm.   Pulmonary/Chest: Effort normal. No respiratory distress.  Musculoskeletal: Normal range of motion.  Neurological: She is alert and oriented to person, place, and time.  Skin: Skin is warm and dry. Rash noted.  Vesicular rash anterior right side of abdomen and around to her back. The  area on her back is larger.  Psychiatric: She has a normal mood and affect. Her behavior is normal. Judgment and thought content normal.      Assessment & Plan:   1. Vesicular rash Shingles. Start valtrex 1000 mg tid x 7 days. Calamine lotion may help dry up the vesicles Follow up with PCP in 1 week if no improvement

## 2014-04-12 NOTE — Telephone Encounter (Signed)
I called and made appt for her at [facility] office with NP, Rey at 2:45 today.  Notified patient, she is going to the appt.  bw

## 2014-05-06 ENCOUNTER — Ambulatory Visit (HOSPITAL_BASED_OUTPATIENT_CLINIC_OR_DEPARTMENT_OTHER): Payer: Medicare Other | Admitting: Nurse Practitioner

## 2014-05-06 ENCOUNTER — Telehealth: Payer: Self-pay | Admitting: Oncology

## 2014-05-06 ENCOUNTER — Other Ambulatory Visit (HOSPITAL_BASED_OUTPATIENT_CLINIC_OR_DEPARTMENT_OTHER): Payer: Medicare Other

## 2014-05-06 VITALS — BP 106/72 | HR 82 | Temp 98.2°F | Resp 18 | Ht 65.5 in | Wt 129.4 lb

## 2014-05-06 DIAGNOSIS — M549 Dorsalgia, unspecified: Secondary | ICD-10-CM

## 2014-05-06 DIAGNOSIS — F411 Generalized anxiety disorder: Secondary | ICD-10-CM

## 2014-05-06 DIAGNOSIS — G8929 Other chronic pain: Secondary | ICD-10-CM

## 2014-05-06 DIAGNOSIS — M542 Cervicalgia: Secondary | ICD-10-CM

## 2014-05-06 DIAGNOSIS — D702 Other drug-induced agranulocytosis: Secondary | ICD-10-CM

## 2014-05-06 DIAGNOSIS — C569 Malignant neoplasm of unspecified ovary: Secondary | ICD-10-CM

## 2014-05-06 DIAGNOSIS — R1031 Right lower quadrant pain: Secondary | ICD-10-CM

## 2014-05-06 NOTE — Telephone Encounter (Signed)
Pt confirmed labs/ov per 08/03 POF, gave pt AVS...KJ °

## 2014-05-06 NOTE — Progress Notes (Signed)
  [facility] OFFICE PROGRESS NOTE   Diagnosis:  Ovarian cancer.  INTERVAL HISTORY:   [name] [name] returns as scheduled. She continues to have intermittent right low abdominal pain. The pain increases prior to a bowel movement. She denies nausea/vomiting. She has a good appetite. Bowels moving regularly with the aid of a stool softener. She reports being diagnosed with "shingles" several weeks ago.  Objective:  Vital signs in last 24 hours:  Blood pressure 106/72, pulse 82, temperature 98.2 F (36.8 C), temperature source Oral, resp. rate 18, height 5' 5.5" (1.664 m), weight 129 lb 6.4 oz (58.695 kg), SpO2 97.00%.    HEENT: No thrush or ulcerations. Lymphatics: No palpable cervical, supraclavicular, axillary or inguinal lymph nodes. Resp: Lungs clear. Cardio:  Regular rate and rhythm. GI: Abdomen soft and nontender. No mass. No organomegaly. Vascular: No leg edema. Calves soft and nontender.  Skin: Resolving zoster rash right thoracic dermatome.    Lab Results:  Lab Results  Component Value Date   WBC 5.3 09/11/2013   HGB 15.1* 09/11/2013   HCT 44.5 09/11/2013   MCV 99.1 09/11/2013   PLT 239.0 09/11/2013   NEUTROABS 3.5 09/11/2013    Imaging:  No results found.  Medications: I have reviewed the patient's current medications.  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 A 5 mm nodules remain on the right diaphragm. -  Cycle 1 of adjuvant Taxol/carboplatin chemotherapy initiated on 09/19/2012.   The CA 125 normalized.   She completed day 15 of cycle 6 on 02/06/2013.   Restaging CT evaluation 03/29/2013 showed no evidence of metastatic disease in the chest. There was marked improvement in appearance/resolution of previous described peritoneal/omental disease. There was no convincing evidence  of residual disease. There was minimal increased density in the region of the omentum favored to be treatment-related. There was no pelvic adenopathy.  CA125 3.2 on 02/19/2014. 2. Low abdomen/suprapubic pain prior to the exploratory laparotomy-likely secondary to omental/pelvic tumor; persistent mild pain in the lower abdomen  3. Chronic neck and back pain.  4. Anxiety -persistent despite Lexapro and Xanax. She has been evaluated by psychiatry. Currently on Lexapro. 5. Status post Port-A-Cath placement 09/22/2012. The Port-A-Cath was removed on 04/03/2013.  6. Neutropenia secondary to chemotherapy- day 15 cycle 1 and cycle 3. Taxol/carboplatin not given secondary to neutropenia.  7. Left lower back pain and left leg weakness reported when here on 11/05/2013. She did not complain of this at today's visit.  8. Herpes zoster involving a right thoracic dermatome July 2015. She completed a course of Valtrex.   Disposition: [name] [name] appears stable. She remains in clinical remission from ovarian cancer. We will followup on the CA 125 from today. She is scheduled to see Dr. Fermin Schwab later this month. We scheduled a followup visit here in 6 months with a CA 125. She will contact the office in the interim with any problems.  Plan reviewed with Dr. Benay Spice.    Ned Card ANP/GNP-BC   05/06/2014  4:39 PM

## 2014-05-07 LAB — CA 125: CA 125: 3.6 U/mL (ref 0.0–30.2)

## 2014-05-08 ENCOUNTER — Telehealth: Payer: Self-pay | Admitting: *Deleted

## 2014-05-08 NOTE — Telephone Encounter (Signed)
Called and informed patient of normal ca 125.  Per Dr. Benay Spice.  Patient verbalized understanding.

## 2014-05-08 NOTE — Telephone Encounter (Signed)
Message copied by Wardell Heath on Wed May 08, 2014  9:44 AM ------      Message from: Ladell Pier      Created: Tue May 07, 2014  7:17 PM       Please call patient, ca 125 is normal ------

## 2014-05-10 ENCOUNTER — Ambulatory Visit (AMBULATORY_SURGERY_CENTER): Payer: Self-pay | Admitting: *Deleted

## 2014-05-10 VITALS — Ht 64.0 in | Wt 130.6 lb

## 2014-05-10 DIAGNOSIS — Z1211 Encounter for screening for malignant neoplasm of colon: Secondary | ICD-10-CM

## 2014-05-10 MED ORDER — MOVIPREP 100 G PO SOLR: ORAL | Status: DC

## 2014-05-10 NOTE — Progress Notes (Signed)
No allergies to eggs or soy. No problems with anesthesia.  Pt not given Emmi instructions for colonoscopy; no computer access.  Brochure for colonoscopy given to pt  No oxygen use  No diet drug use  Given voucher for free MoviPrep

## 2014-05-21 ENCOUNTER — Encounter: Payer: Self-pay | Admitting: Gastroenterology

## 2014-05-24 ENCOUNTER — Encounter: Payer: Self-pay | Admitting: Gynecology

## 2014-05-24 ENCOUNTER — Ambulatory Visit: Payer: Medicare Other | Attending: Gynecology | Admitting: Gynecology

## 2014-05-24 VITALS — BP 110/71 | HR 75 | Temp 99.0°F | Resp 20 | Ht 64.0 in | Wt 128.3 lb

## 2014-05-24 DIAGNOSIS — Z9079 Acquired absence of other genital organ(s): Secondary | ICD-10-CM | POA: Diagnosis not present

## 2014-05-24 DIAGNOSIS — F3289 Other specified depressive episodes: Secondary | ICD-10-CM | POA: Insufficient documentation

## 2014-05-24 DIAGNOSIS — Z9089 Acquired absence of other organs: Secondary | ICD-10-CM | POA: Insufficient documentation

## 2014-05-24 DIAGNOSIS — K5900 Constipation, unspecified: Secondary | ICD-10-CM | POA: Diagnosis not present

## 2014-05-24 DIAGNOSIS — Z8543 Personal history of malignant neoplasm of ovary: Secondary | ICD-10-CM | POA: Insufficient documentation

## 2014-05-24 DIAGNOSIS — F329 Major depressive disorder, single episode, unspecified: Secondary | ICD-10-CM | POA: Diagnosis not present

## 2014-05-24 DIAGNOSIS — E7800 Pure hypercholesterolemia, unspecified: Secondary | ICD-10-CM | POA: Diagnosis not present

## 2014-05-24 DIAGNOSIS — Z9221 Personal history of antineoplastic chemotherapy: Secondary | ICD-10-CM | POA: Diagnosis not present

## 2014-05-24 DIAGNOSIS — C569 Malignant neoplasm of unspecified ovary: Secondary | ICD-10-CM

## 2014-05-24 DIAGNOSIS — F411 Generalized anxiety disorder: Secondary | ICD-10-CM | POA: Insufficient documentation

## 2014-05-24 DIAGNOSIS — Z90710 Acquired absence of both cervix and uterus: Secondary | ICD-10-CM | POA: Insufficient documentation

## 2014-05-24 NOTE — Progress Notes (Signed)
Consult Note: Gyn-Onc   [name] [name] 63 y.o. female  Chief Complaint  Patient presents with  . Ovarian Cancer    Assessment: stage IIIc ovarian cancer (optimally debulked. Status post 6 cycles of carboplatin and Taxol given in a dose dense regimen. She is clinically free of disease.  Chronic constipation..  Plan  We will plan on seeing the patient in 3 months and obtain a CA 125 just prior to each of those visits. The patient continues to [facility] as. Her for constipation. She will schedule colonoscopy in the near future.    Interval History:  Patient returns as previously scheduled for followup. Since her last visit she's done well. She had a recent CA 125 value of 3.6 units per mL (previously was 4.8 units per mL). Patient seems to be doing well except for chronic constipation and some lower abdominal discomfort. . She has no GI or GU symptoms.  . She has no pelvic pain pressure vaginal discharge.     LFY:BOFBPZW is a 63 year old gravida 2 para 2 who went through menopause at the age about 63 years of age. She never took any hormone replacement therapy. She states that for the past 6 months she's experienced some pelvic soreness in pressure. She has noted some increasing urinary frequency. With these symptoms she had a PET scan on October 9. It revealed significant soft tissue thickening seen throughout the greater omentum suspicious for intraperitoneal carcinoma. There is minimal ascites in the pelvis. it revealed the areas of omental and peritoneal nodularity were hypermetabolic. There is a dominant pelvic omental mass measuring 10.4 x 3.3 cm. There is a small omental implants in the left side of the abdomen measuring 1.1 cm. There were multiple pelvic foci That wereperitoneal based And hypermetabolic. These included the cul-de-sac. She has small volume of ascites. She underwent a CT-guided biopsy on July 24, 2012. It revealed metastatic carcinoma. By immunohistochemistry, malignant cells  were positive for WT 1, cytokeratin 7, estrogen receptor. There were negative for TTF-1, progesterone receptor, cytokeratin 20, CEA, CD X2, and S100. Overall, the histologic features were highly suggestive of a primary gynecologic origin. Her CA 125 on October 10 was elevated at 69.  She underwent exploratory laparotomy on 08/22/2012 findings of stage IIIC ovarian cancer. Patient was optimally debulked. Debulking, however, required a rectosigmoid resection as well as total omentectomy total nominal hysterectomy bilateral salpingo-oophorectomy. She was optimally debulked with only 5 mm nodules on the right diaphragm as residual disease.  SURGICAL FINDINGS: At exploratory laparotomy the infracolic omentum was nearly replaced with tumor. This measured approximately 12 x 16 cm. There were 5 mm nodules on the right diaphragm. The small bowel serosa and mesentery were normal. In the pelvis it appeared that the left ovary was a primary source. It was cystic and densely adherent to the sigmoid mesentery. The right ovary was approximately 4 cm in diameter and had multiple excrescences on it. There were extensive tumor implants on the bladder flap and involving the sigmoid mesentery and serosa, and posterior cul-de-sac. At the conclusion of the surgical procedure the only residual disease were 5 mm nodules on the right diaphragm. Chemotherapy was initiated in December 2013 using the dose dense regimen of carboplatin and Taxol   She received 6 cycles of carboplatin and Taxol the last administered on 02/06/2013. At that time her CA 125 is 5.6 units per mL.  Review of Systems:10 point review of systems is negative as noted above.   Vitals: Blood pressure 110/71, pulse  75, temperature 99 F (37.2 C), temperature source Oral, resp. rate 20, height '5\' 4"'  (1.626 m), weight 128 lb 4.8 oz (58.196 kg).  Physical Exam: General : The patient is a healthy woman in no acute distress.  HEENT: normocephalic, extraoccular  movements normal; neck is supple without thyromegally  Lynphnodes: Supraclavicular and inguinal nodes not enlarged Breasts are without masses, skin changes, or nipple discharge. Axillary nodes are not enlarged.  Abdomen: Soft, non-tender, no ascites, no organomegally, no masses, no hernias  Pelvic:  EGBUS: Normal female  Vagina: Normal, no lesions no evidence of bleeding. Urethra and Bladder: Normal, non-tender  Cervix: Surgically absent  Uterus: Surgically absent   bi-manual examination: Non-tender; no adenxal masses or nodularity  Rectal: normal sphincter tone, no masses, no blood  Lower extremities: No edema or varicosities. Normal range of motion      Allergies  Allergen Reactions  . Atorvastatin     REACTION: causes leg pain  . Decongest-Aid [Pseudoephedrine]     Makes my heart flutter  . Macrodantin     Per pt: unknown    Past Medical History  Diagnosis Date  . Rheumatoid arthritis(714.0) ~ 2010  . Endometrial polyp   . Elevated cholesterol   . Ovarian ca 07-2012       . DJD (degenerative joint disease)   . IRRITABLE BOWEL SYNDROME, HX OF 05/15/2008    Qualifier: Diagnosis of  By: Julien Girt CMA, Marliss Czar    . Anxiety and depression    . MVP (mitral valve prolapse)     occ palpitations     Past Surgical History  Procedure Laterality Date  . Hand surgery  1996  . Tubal ligation  1980  . Hysteroscopy  2004    D&C  . Dilation and curettage of uterus  2004    WITH HYSTEROSCOPY  . Neck surgery  2009    SPURS  . Cyst on neck  2011  . Abdominal hysterectomy  08/22/2012    Procedure: HYSTERECTOMY ABDOMINAL;  Surgeon: Alvino Chapel, MD;  Location: [facility];  Service: Gynecology;  Laterality: N/A;  . Salpingoophorectomy  08/22/2012    Procedure: SALPINGO OOPHORECTOMY;  Surgeon: Alvino Chapel, MD;  Location: [facility];  Service: Gynecology;  Laterality: Bilateral;  . Omentectomy  08/22/2012    Procedure: OMENTECTOMY;  Surgeon: Alvino Chapel, MD;   Location: [facility];  Service: Gynecology;  Laterality: N/A;  . Debulking  08/22/2012    Procedure: DEBULKING;  Surgeon: Alvino Chapel, MD;  Location: [facility];  Service: Gynecology;  Laterality: N/A;  Radical tumor debulking, Bilateral Ureterolysis  . Colostomy revision  08/22/2012    Procedure: COLON RESECTION SIGMOID;  Surgeon: Alvino Chapel, MD;  Location: [facility];  Service: Gynecology;;  Rectal Sigmoid resection and low rectal anastomosis  . Back surgery  2008    Dr Trenton Gammon    Current Outpatient Prescriptions  Medication Sig Dispense Refill  . calcium-vitamin D (OSCAL WITH D) 500-200 MG-UNIT per tablet Take 1 tablet by mouth daily.        [name] docusate sodium (COLACE) 100 MG capsule Take 100 mg by mouth daily.      [name] escitalopram (LEXAPRO) 20 MG tablet Take 20 mg by mouth daily. Take one and 1/2 tablet in the morning      . estazolam (PROSOM) 2 MG tablet Take 2 mg by mouth at bedtime.      . fish oil-omega-3 fatty acids 1000 MG capsule Take 1 g by mouth daily.        [name]  Flaxseed, Linseed, (FLAX SEED OIL) 1000 MG CAPS Take 1 capsule by mouth daily.      [name] MOVIPREP 100 G SOLR moviprep as directed. No substitutions  1 kit  0  . Multiple Vitamin (MULTIVITAMIN WITH MINERALS) TABS Take 1 tablet by mouth daily.      . polyethylene glycol (MIRALAX / GLYCOLAX) packet Take 17 g by mouth daily as needed.      . pravastatin (PRAVACHOL) 40 MG tablet Take 1 tablet (40 mg total) by mouth daily.  90 tablet  3   No current facility-administered medications for this visit.   Facility-Administered Medications Ordered in Other Visits  Medication Dose Route Frequency Provider Last Rate Last Dose  . sodium chloride 0.9 % injection 10 mL  10 mL Intracatheter PRN Ladell Pier, MD   10 mL at 01/16/13 1359    History   Social History  . Marital Status: Married    Spouse Name: N/A    Number of Children: 2  . Years of Education: N/A   Occupational History  . disability d/t RA    Social  History Main Topics  . Smoking status: Never Smoker   . Smokeless tobacco: Never Used  . Alcohol Use: No  . Drug Use: No  . Sexual Activity: No   Other Topics Concern  . Not on file   Social History Narrative   [name] is her daughter     Family History  Problem Relation Age of Onset  . Heart disease Mother   . Osteoporosis Sister   . Bladder Cancer Father 34  . Lymphoma Paternal Aunt   . Colon cancer Neg Hx       CLARKE-PEARSON,Wray Goehring L, MD 05/24/2014, 2:42 PM

## 2014-05-24 NOTE — Patient Instructions (Signed)
Plan to see Dr. Fermin Schwab in three months or sooner if needed.

## 2014-06-11 ENCOUNTER — Encounter: Payer: Medicare Other | Admitting: Gastroenterology

## 2014-07-04 HISTORY — PX: COLONOSCOPY W/ BIOPSIES: SHX1374

## 2014-07-16 ENCOUNTER — Ambulatory Visit (AMBULATORY_SURGERY_CENTER): Payer: Self-pay | Admitting: *Deleted

## 2014-07-16 ENCOUNTER — Telehealth: Payer: Self-pay | Admitting: *Deleted

## 2014-07-16 VITALS — Ht 64.0 in | Wt 129.8 lb

## 2014-07-16 DIAGNOSIS — Z1211 Encounter for screening for malignant neoplasm of colon: Secondary | ICD-10-CM

## 2014-07-16 NOTE — Progress Notes (Signed)
No egg or soy. ewm No 02 use, no diet pills. ewm No problems with past sedation. ewm Last colon w/ dr stark 04-20-2004 ewm

## 2014-07-16 NOTE — Telephone Encounter (Signed)
error 

## 2014-07-17 ENCOUNTER — Telehealth: Payer: Self-pay | Admitting: *Deleted

## 2014-07-17 NOTE — Telephone Encounter (Signed)
Pt called with concerns regarding colonoscopy, pt requested to speak with NP, concerned colonoscopy will " reactivate" ovarian cancer cells. Discussed with pt having a colonoscopy will not " reactivate" ovarian cancer. Pt again requested to speak with NP to confirm these concerns. Message forward to NP for review.

## 2014-07-23 ENCOUNTER — Encounter: Payer: Self-pay | Admitting: Gastroenterology

## 2014-07-23 ENCOUNTER — Ambulatory Visit (AMBULATORY_SURGERY_CENTER): Payer: Medicare Other | Admitting: Gastroenterology

## 2014-07-23 VITALS — BP 101/61 | HR 56 | Temp 97.6°F | Resp 11 | Ht 64.0 in | Wt 129.0 lb

## 2014-07-23 DIAGNOSIS — D122 Benign neoplasm of ascending colon: Secondary | ICD-10-CM

## 2014-07-23 DIAGNOSIS — Z1211 Encounter for screening for malignant neoplasm of colon: Secondary | ICD-10-CM

## 2014-07-23 MED ORDER — SODIUM CHLORIDE 0.9 % IV SOLN: 500.0000 mL | INTRAVENOUS | Status: DC

## 2014-07-23 NOTE — Patient Instructions (Signed)
YOU HAD AN ENDOSCOPIC PROCEDURE TODAY AT THE [facility]: Refer to the procedure report that was given to you for any specific questions about what was found during the examination.  If the procedure report does not answer your questions, please call your gastroenterologist to clarify.  If you requested that your care partner not be given the details of your procedure findings, then the procedure report has been included in a sealed envelope for you to review at your convenience later.  YOU SHOULD EXPECT: Some feelings of bloating in the abdomen. Passage of more gas than usual.  Walking can help get rid of the air that was put into your GI tract during the procedure and reduce the bloating. If you had a lower endoscopy (such as a colonoscopy or flexible sigmoidoscopy) you may notice spotting of blood in your stool or on the toilet paper. If you underwent a bowel prep for your procedure, then you may not have a normal bowel movement for a few days.  DIET: Your first meal following the procedure should be a light meal and then it is ok to progress to your normal diet.  A half-sandwich or bowl of soup is an example of a good first meal.  Heavy or fried foods are harder to digest and may make you feel nauseous or bloated.  Likewise meals heavy in dairy and vegetables can cause extra gas to form and this can also increase the bloating.  Drink plenty of fluids but you should avoid alcoholic beverages for 24 hours.  ACTIVITY: Your care partner should take you home directly after the procedure.  You should plan to take it easy, moving slowly for the rest of the day.  You can resume normal activity the day after the procedure however you should NOT DRIVE or use heavy machinery for 24 hours (because of the sedation medicines used during the test).    SYMPTOMS TO REPORT IMMEDIATELY: A gastroenterologist can be reached at any hour.  During normal business hours, 8:30 AM to 5:00 PM Monday through Friday,  call [phone].  After hours and on weekends, please call the GI answering service at [phone] who will take a message and have the physician on call contact you.   Following lower endoscopy (colonoscopy or flexible sigmoidoscopy):  Excessive amounts of blood in the stool  Significant tenderness or worsening of abdominal pains  Swelling of the abdomen that is new, acute  Fever of 100F or higher   FOLLOW UP: If any biopsies were taken you will be contacted by phone or by letter within the next 1-3 weeks.  Call your gastroenterologist if you have not heard about the biopsies in 3 weeks.  Our staff will call the home number listed on your records the next business day following your procedure to check on you and address any questions or concerns that you may have at that time regarding the information given to you following your procedure. This is a courtesy call and so if there is no answer at the home number and we have not heard from you through the emergency physician on call, we will assume that you have returned to your regular daily activities without incident.  SIGNATURES/CONFIDENTIALITY: You and/or your care partner have signed paperwork which will be entered into your electronic medical record.  These signatures attest to the fact that that the information above on your After Visit Summary has been reviewed and is understood.  Full responsibility of the confidentiality of   this discharge information lies with you and/or your care-partner.  Polyp-handout given  Repeat colonoscopy will be determined by pathology   

## 2014-07-23 NOTE — Progress Notes (Signed)
Called to room to assist during endoscopic procedure.  Patient ID and intended procedure confirmed with present staff. Received instructions for my participation in the procedure from the performing physician.  

## 2014-07-23 NOTE — Op Note (Signed)
[place]  [address] [place], [address]   COLONOSCOPY PROCEDURE REPORT  PATIENT: [name] [name], [name] [name]  MR#: [id number] BIRTHDATE: Jan 02, 1951 , 49  yrs. old GENDER: female ENDOSCOPIST: [name], MD, [facility] PROCEDURE DATE:  07/23/2014 PROCEDURE:   Colonoscopy with snare polypectomy First Screening Colonoscopy - Avg.  risk and is 50 yrs.  old or older - No.  Prior Negative Screening - Now for repeat screening. 10 or more years since last screening  History of Adenoma - Now for follow-up colonoscopy & has been > or = to 3 yrs.  N/A  Polyps Removed Today? Yes. ASA CLASS:   Class II INDICATIONS:average risk for colorectal cancer. MEDICATIONS: Monitored anesthesia care and Propofol 200 mg IV DESCRIPTION OF PROCEDURE:   After the risks benefits and alternatives of the procedure were thoroughly explained, informed consent was obtained.  The digital rectal exam revealed no abnormalities of the rectum.   The LB [id number]  endoscope was introduced through the anus and advanced to the cecum, which was identified by both the appendix and ileocecal valve. No adverse events experienced.   The quality of the prep was excellent, using MoviPrep  The instrument was then slowly withdrawn as the colon was fully examined.    COLON FINDINGS: A sessile polyp measuring 5 mm in size was found in the ascending colon.  A polypectomy was performed with a cold snare.  The resection was complete, the polyp tissue was completely retrieved and sent to histology.   There was evidence of a prior end-to-end colo-colonic surgical anastomosis in the sigmoid colon. The examination was otherwise normal.  Retroflexed views revealed no abnormalities. The time to cecum=2 minutes 54 seconds. Withdrawal time=8 minutes 29 seconds.  The scope was withdrawn and the procedure completed.  COMPLICATIONS: There were no immediate complications.  ENDOSCOPIC IMPRESSION: 1.   Sessile polyp in the  ascending colon; polypectomy performed with a cold snare 2.   Prior colo-colonic surgical anastomosis in the sigmoid colon 3.   The examination was otherwise normal  RECOMMENDATIONS: 1.  Await pathology results 2.  Repeat colonoscopy in 5 years if polyp adenomatous; otherwise 10 years  eSigned:  [name], MD, [facility] 07/23/2014 11:17 AM

## 2014-07-23 NOTE — Progress Notes (Signed)
Pt stable to RR 

## 2014-07-24 ENCOUNTER — Telehealth: Payer: Self-pay | Admitting: *Deleted

## 2014-07-24 NOTE — Telephone Encounter (Signed)
  Follow up Call-  Call back number 07/23/2014  Post procedure Call Back phone  # [phone]  Permission to leave phone message Yes    Spoke with husband; pt was asleep Patient questions:  Do you have a fever, pain , or abdominal swelling? No. Pain Score  0 *  Have you tolerated food without any problems? Yes.    Have you been able to return to your normal activities? Yes.    Do you have any questions about your discharge instructions: Diet   No. Medications  No. Follow up visit  No.  Do you have questions or concerns about your Care? No.  Actions: * If pain score is 4 or above: No action needed, pain <4.

## 2014-07-30 ENCOUNTER — Encounter: Payer: Self-pay | Admitting: Gastroenterology

## 2014-08-02 ENCOUNTER — Ambulatory Visit (INDEPENDENT_AMBULATORY_CARE_PROVIDER_SITE_OTHER): Payer: Medicare Other

## 2014-08-02 DIAGNOSIS — Z23 Encounter for immunization: Secondary | ICD-10-CM

## 2014-08-05 ENCOUNTER — Encounter: Payer: Self-pay | Admitting: Gastroenterology

## 2014-08-08 ENCOUNTER — Other Ambulatory Visit: Payer: Medicare Other

## 2014-08-08 DIAGNOSIS — C569 Malignant neoplasm of unspecified ovary: Secondary | ICD-10-CM

## 2014-08-09 ENCOUNTER — Telehealth: Payer: Self-pay | Admitting: *Deleted

## 2014-08-09 ENCOUNTER — Ambulatory Visit: Payer: Medicare Other | Attending: Gynecology | Admitting: Gynecology

## 2014-08-09 ENCOUNTER — Encounter: Payer: Self-pay | Admitting: Gynecology

## 2014-08-09 VITALS — BP 119/69 | HR 82 | Temp 98.1°F | Resp 18 | Ht 64.0 in | Wt 128.6 lb

## 2014-08-09 DIAGNOSIS — M069 Rheumatoid arthritis, unspecified: Secondary | ICD-10-CM | POA: Insufficient documentation

## 2014-08-09 DIAGNOSIS — Z79899 Other long term (current) drug therapy: Secondary | ICD-10-CM | POA: Diagnosis not present

## 2014-08-09 DIAGNOSIS — E785 Hyperlipidemia, unspecified: Secondary | ICD-10-CM | POA: Insufficient documentation

## 2014-08-09 DIAGNOSIS — C569 Malignant neoplasm of unspecified ovary: Secondary | ICD-10-CM | POA: Diagnosis not present

## 2014-08-09 DIAGNOSIS — M1990 Unspecified osteoarthritis, unspecified site: Secondary | ICD-10-CM | POA: Diagnosis not present

## 2014-08-09 DIAGNOSIS — Z8543 Personal history of malignant neoplasm of ovary: Secondary | ICD-10-CM

## 2014-08-09 DIAGNOSIS — K5900 Constipation, unspecified: Secondary | ICD-10-CM

## 2014-08-09 DIAGNOSIS — D126 Benign neoplasm of colon, unspecified: Secondary | ICD-10-CM | POA: Diagnosis not present

## 2014-08-09 LAB — CA 125: CA 125: 5 U/mL (ref <35)

## 2014-08-09 LAB — CA 125(PREVIOUS METHOD): CA 125: 3.8 U/mL (ref 0.0–30.2)

## 2014-08-09 NOTE — Telephone Encounter (Signed)
Left VM to call office next week for lab results.

## 2014-08-09 NOTE — Telephone Encounter (Signed)
-----   Message from Ladell Pier, MD sent at 08/09/2014  9:45 AM EST ----- Please call patient, ca125 is normal

## 2014-08-09 NOTE — Progress Notes (Signed)
Consult Note: Gyn-Onc   [name] [name] 63 y.o. female  Chief Complaint  Patient presents with  . Ovarian Cancer    Assessment: stage IIIc ovarian cancer (optimally debulked. Status post 6 cycles of carboplatin and Taxol given in a dose dense regimencompleted May 2014.[name] She is clinically free of disease. Newly diagnosed tubular adenoma of the colon Chronic constipation..  Plan  We will plan on seeing the patient in 3 months and obtain a CA 125 just prior to each of those visits. The patient continues to [facility] for constipation. She will schedule colonoscopy follow-up in 5 years. Continue annual mammograms..    Interval History:  Patient returns as previously scheduled for followup. Since her last visit she's done well. She had a recent CA 125 value of 3.8 units per mL  Patient seems to be doing well except for chronic constipation and some lower abdominal discomfort. . She has no GI or GU symptoms.  . She has no pelvic pain pressure vaginal discharge. She did have a screening colonoscopy recently that revealed a tubular adenoma.    [name] [name] is a 63 year old gravida 2 para 2 who went through menopause at the age about 63 years of age. She never took any hormone replacement therapy. She states that for the past 6 months she's experienced some pelvic soreness in pressure. She has noted some increasing urinary frequency. With these symptoms she had a PET scan on October 9. It revealed significant soft tissue thickening seen throughout the greater omentum suspicious for intraperitoneal carcinoma. There is minimal ascites in the pelvis. it revealed the areas of omental and peritoneal nodularity were hypermetabolic. There is a dominant pelvic omental mass measuring 10.4 x 3.3 cm. There is a small omental implants in the left side of the abdomen measuring 1.1 cm. There were multiple pelvic foci That wereperitoneal based And hypermetabolic. These included the cul-de-sac. She has small volume  of ascites. She underwent a CT-guided biopsy on July 24, 2012. It revealed metastatic carcinoma. By immunohistochemistry, malignant cells were positive for WT 1, cytokeratin 7, estrogen receptor. There were negative for TTF-1, progesterone receptor, cytokeratin 20, CEA, CD X2, and S100. Overall, the histologic features were highly suggestive of a primary gynecologic origin. Her CA 125 on October 10 was elevated at 69.  She underwent exploratory laparotomy on 08/22/2012 findings of stage IIIC ovarian cancer. Patient was optimally debulked. Debulking, however, required a rectosigmoid resection as well as total omentectomy total nominal hysterectomy bilateral salpingo-oophorectomy. She was optimally debulked with only 5 mm nodules on the right diaphragm as residual disease.  SURGICAL FINDINGS: At exploratory laparotomy the infracolic omentum was nearly replaced with tumor. This measured approximately 12 x 16 cm. There were 5 mm nodules on the right diaphragm. The small bowel serosa and mesentery were normal. In the pelvis it appeared that the left ovary was a primary source. It was cystic and densely adherent to the sigmoid mesentery. The right ovary was approximately 4 cm in diameter and had multiple excrescences on it. There were extensive tumor implants on the bladder flap and involving the sigmoid mesentery and serosa, and posterior cul-de-sac. At the conclusion of the surgical procedure the only residual disease were 5 mm nodules on the right diaphragm. Chemotherapy was initiated in December 2013 using the dose dense regimen of carboplatin and Taxol   She received 6 cycles of carboplatin and Taxol the last administered on 02/06/2013. At that time her CA 125 is 5.6 units per mL.  Review of Systems:10  point review of systems is negative as noted above.   Vitals: Blood pressure 119/69, pulse 82, temperature 98.1 F (36.7 C), temperature source Oral, resp. rate 18, height 5\' 4"  (1.626 m), weight 128 lb  9.6 oz (58.333 kg).  Physical Exam: General : The patient is a healthy woman in no acute distress.  HEENT: normocephalic, extraoccular movements normal; neck is supple without thyromegally  Lynphnodes: Supraclavicular and inguinal nodes not enlarged Breasts are without masses, skin changes, or nipple discharge. Axillary nodes are not enlarged.  Abdomen: Soft, non-tender, no ascites, no organomegally, no masses, no hernias  Pelvic:  EGBUS: Normal female  Vagina: Normal, no lesions no evidence of bleeding. Urethra and Bladder: Normal, non-tender  Cervix: Surgically absent  Uterus: Surgically absent   bi-manual examination: Non-tender; no adenxal masses or nodularity  Rectal: normal sphincter tone, no masses, no blood  Lower extremities: No edema or varicosities. Normal range of motion      Allergies  Allergen Reactions  . Atorvastatin     REACTION: causes leg pain  . Decongest-Aid [Pseudoephedrine]     Makes my heart flutter  . Macrodantin     Per pt: unknown    Past Medical History  Diagnosis Date  . Rheumatoid arthritis(714.0) ~ 2010  . Endometrial polyp   . Elevated cholesterol   . Ovarian ca 07-2012       . DJD (degenerative joint disease)   . IRRITABLE BOWEL SYNDROME, HX OF 05/15/2008    Qualifier: Diagnosis of  By: Julien Girt CMA, Marliss Czar    . Anxiety and depression    . MVP (mitral valve prolapse)     occ palpitations   . Shingles     Past Surgical History  Procedure Laterality Date  . Hand surgery  1996  . Tubal ligation  1980  . Hysteroscopy  2004    D&C  . Dilation and curettage of uterus  2004    WITH HYSTEROSCOPY  . Neck surgery  2009    SPURS  . Cyst on neck  2011  . Abdominal hysterectomy  08/22/2012    Procedure: HYSTERECTOMY ABDOMINAL;  Surgeon: Alvino Chapel, MD;  Location: [facility];  Service: Gynecology;  Laterality: N/A;  . Salpingoophorectomy  08/22/2012    Procedure: SALPINGO OOPHORECTOMY;  Surgeon: Alvino Chapel, MD;   Location: [facility];  Service: Gynecology;  Laterality: Bilateral;  . Omentectomy  08/22/2012    Procedure: OMENTECTOMY;  Surgeon: Alvino Chapel, MD;  Location: [facility];  Service: Gynecology;  Laterality: N/A;  . Debulking  08/22/2012    Procedure: DEBULKING;  Surgeon: Alvino Chapel, MD;  Location: [facility];  Service: Gynecology;  Laterality: N/A;  Radical tumor debulking, Bilateral Ureterolysis  . Colostomy revision  08/22/2012    Procedure: COLON RESECTION SIGMOID;  Surgeon: Alvino Chapel, MD;  Location: [facility];  Service: Gynecology;;  Rectal Sigmoid resection and low rectal anastomosis  . Back surgery  2008    Dr Trenton Gammon  . Colonoscopy  04-20-2004  . Colonoscopy w/ biopsies  07/2014    Current Outpatient Prescriptions  Medication Sig Dispense Refill  . calcium-vitamin D (OSCAL WITH D) 500-200 MG-UNIT per tablet Take 1 tablet by mouth daily.      [name] docusate sodium (COLACE) 100 MG capsule Take 100 mg by mouth daily.    [name] escitalopram (LEXAPRO) 20 MG tablet Take 20 mg by mouth daily. Take one and 1/2 tablet in the morning    . estazolam (PROSOM) 2 MG tablet Take  2 mg by mouth at bedtime.    . fish oil-omega-3 fatty acids 1000 MG capsule Take 1 g by mouth daily.      . Flaxseed, Linseed, (FLAX SEED OIL) 1000 MG CAPS Take 1 capsule by mouth daily.    . Multiple Vitamin (MULTIVITAMIN WITH MINERALS) TABS Take 1 tablet by mouth daily.    . pravastatin (PRAVACHOL) 40 MG tablet Take 1 tablet (40 mg total) by mouth daily. 90 tablet 3   No current facility-administered medications for this visit.   Facility-Administered Medications Ordered in Other Visits  Medication Dose Route Frequency Provider Last Rate Last Dose  . sodium chloride 0.9 % injection 10 mL  10 mL Intracatheter PRN Ladell Pier, MD   10 mL at 01/16/13 1359    History   Social History  . Marital Status: Married    Spouse Name: N/A    Number of Children: 2  . Years of Education: N/A   Occupational  History  . disability d/t RA    Social History Main Topics  . Smoking status: Never Smoker   . Smokeless tobacco: Never Used  . Alcohol Use: No  . Drug Use: No  . Sexual Activity: No   Other Topics Concern  . Not on file   Social History Narrative   [name] is her daughter     Family History  Problem Relation Age of Onset  . Heart disease Mother   . Osteoporosis Sister   . Bladder Cancer Father 34  . Lymphoma Paternal Aunt   . Colon cancer Neg Hx   . Rectal cancer Neg Hx   . Stomach cancer Neg Hx       CLARKE-PEARSON,Mivaan Corbitt L, MD 08/09/2014, 10:56 AM

## 2014-08-09 NOTE — Patient Instructions (Signed)
Return to see us in 3 months. 

## 2014-08-13 ENCOUNTER — Telehealth: Payer: Self-pay | Admitting: Gynecologic Oncology

## 2014-08-13 NOTE — Telephone Encounter (Signed)
Returned call to patient.  Patient concerned about having a voicemail from Dr. Gearldine Shown RN about lab work.  Reassured that her CA 125 was normal and his RN was calling to let her know that information.  Advised to call for any other concerns or questions.  Patient verbalizing understanding.

## 2014-10-18 ENCOUNTER — Encounter: Payer: Self-pay | Admitting: Internal Medicine

## 2014-10-18 ENCOUNTER — Ambulatory Visit (INDEPENDENT_AMBULATORY_CARE_PROVIDER_SITE_OTHER): Payer: Medicare Other | Admitting: Internal Medicine

## 2014-10-18 VITALS — BP 107/74 | HR 99 | Temp 97.7°F | Ht 64.0 in | Wt 125.1 lb

## 2014-10-18 DIAGNOSIS — Z0000 Encounter for general adult medical examination without abnormal findings: Secondary | ICD-10-CM | POA: Insufficient documentation

## 2014-10-18 DIAGNOSIS — R8281 Pyuria: Secondary | ICD-10-CM

## 2014-10-18 DIAGNOSIS — Z23 Encounter for immunization: Secondary | ICD-10-CM

## 2014-10-18 LAB — COMPREHENSIVE METABOLIC PANEL
ALBUMIN: 4.2 g/dL (ref 3.5–5.2)
ALT: 31 U/L (ref 0–35)
AST: 23 U/L (ref 0–37)
Alkaline Phosphatase: 65 U/L (ref 39–117)
BUN: 22 mg/dL (ref 6–23)
CALCIUM: 9.6 mg/dL (ref 8.4–10.5)
CO2: 29 mEq/L (ref 19–32)
Chloride: 104 mEq/L (ref 96–112)
Creatinine, Ser: 0.69 mg/dL (ref 0.40–1.20)
GFR: 90.78 mL/min (ref >60.00)
Glucose, Bld: 90 mg/dL (ref 70–99)
POTASSIUM: 3.8 meq/L (ref 3.5–5.1)
SODIUM: 140 meq/L (ref 135–145)
Total Bilirubin: 0.7 mg/dL (ref 0.2–1.2)
Total Protein: 7.2 g/dL (ref 6.0–8.3)

## 2014-10-18 LAB — URINALYSIS, ROUTINE W REFLEX MICROSCOPIC
BILIRUBIN URINE: NEGATIVE
KETONES UR: NEGATIVE
Nitrite: NEGATIVE
Specific Gravity, Urine: >=1.03 — AB (ref 1.000–1.030)
TOTAL PROTEIN, URINE-UPE24: NEGATIVE
Urine Glucose: NEGATIVE
Urobilinogen, UA: 0.2 (ref 0.0–1.0)
pH: 5 (ref 5.0–8.0)

## 2014-10-18 LAB — CBC WITH DIFFERENTIAL/PLATELET
BASOS PCT: 0.6 % (ref 0.0–3.0)
Basophils Absolute: 0 10*3/uL (ref 0.0–0.1)
Eosinophils Absolute: 0.1 10*3/uL (ref 0.0–0.7)
Eosinophils Relative: 1.2 % (ref 0.0–5.0)
HCT: 45.6 % (ref 36.0–46.0)
HEMOGLOBIN: 14.8 g/dL (ref 12.0–15.0)
LYMPHS PCT: 23.3 % (ref 12.0–46.0)
Lymphs Abs: 1.5 10*3/uL (ref 0.7–4.0)
MCHC: 32.5 g/dL (ref 30.0–36.0)
MCV: 99.2 fl (ref 78.0–100.0)
Monocytes Absolute: 0.6 10*3/uL (ref 0.1–1.0)
Monocytes Relative: 9.3 % (ref 3.0–12.0)
NEUTROS ABS: 4.1 10*3/uL (ref 1.4–7.7)
Neutrophils Relative %: 65.6 % (ref 43.0–77.0)
Platelets: 245 10*3/uL (ref 150.0–400.0)
RBC: 4.6 Mil/uL (ref 3.87–5.11)
RDW: 13.3 % (ref 11.5–15.5)
WBC: 6.2 10*3/uL (ref 4.0–10.5)

## 2014-10-18 LAB — LIPID PANEL
Cholesterol: 178 mg/dL (ref 0–200)
HDL: 51.9 mg/dL (ref >39.00)
LDL Cholesterol: 103 mg/dL — ABNORMAL HIGH (ref 0–99)
NONHDL: 126.1
Total CHOL/HDL Ratio: 3
Triglycerides: 115 mg/dL (ref 0.0–149.0)
VLDL: 23 mg/dL (ref 0.0–40.0)

## 2014-10-18 LAB — VITAMIN D 25 HYDROXY (VIT D DEFICIENCY, FRACTURES): VITD: 28.02 ng/mL — ABNORMAL LOW (ref 30.00–100.00)

## 2014-10-18 NOTE — Assessment & Plan Note (Addendum)
Td today Pneumonia shot today Prevnar --- will discuss on return to the office Had a flu shot Zostavax, I am   reluctant to proceed at this time, she has some degree of immunosuppression due to rheumatoid arthritis. Last colonoscopy 07-2014, next 5 years, Dr. Fuller Plan Female care: Plans to see her gynecologist oncologist. Mammogram [phone] negative  Diet and exercise discussed  Other issues: Anxiety, on Lexapro 30 mg, frequent times takes 20 mg only. Symptoms not completely well-controlled. Rec consistent use of 30 mg and consider counseling High cholesterol, not well controlled, on Pravachol, better compliance in the last few months. In the past declined to start zetia. Will check labs. Offer zetia again depending on results  Does have some mild shoulder pains, no generalized myalgias. Unclear if symptoms are related to cholesterol medication. Vag bleed, strongly rec to see oncology Vitamin D deficiency, check labs RTC 4 months

## 2014-10-18 NOTE — Patient Instructions (Signed)
Get your blood work before you leave    Please come back to the office in 4 months  for a routine check up

## 2014-10-18 NOTE — Progress Notes (Signed)
Pre visit review using our clinic review tool, if applicable. No additional management support is needed unless otherwise documented below in the visit note. 

## 2014-10-18 NOTE — Progress Notes (Signed)
Subjective:    Patient ID: [name] [name], female    DOB: 16-Nov-1950, 64 y.o.   MRN: [id number]  DOS:  10/18/2014 Type of visit - description : cpx Interval history: Reports that is slightly more anxious than before, husband is currently unable to work. On Lexapro 30 mg, usually takes 20 (doesn't like to take "too much"). Occasionally has aches around the shoulders, related to cholesterol medication?. Denies fever chills or headaches,no generalized myalgias;  did have some weight loss lately.   ROS Denies chest pain, dyspnea on exertion, very rarely feels short of breath. No blood in the stools No cough, sputum production or wheezing. No suicidal ideas Occasional dysuria, no gross hematuria difficulty urinating. She has ovarian cancer, from time to time she sees vaginal bleeding, small amounts, plans to discuss with hematology-onc    Past Medical History  Diagnosis Date  . Rheumatoid arthritis(714.0) ~ 2010    dr Ouida Sills  . Endometrial polyp   . Elevated cholesterol   . Ovarian ca 07-2012       . DJD (degenerative joint disease)   . IRRITABLE BOWEL SYNDROME, HX OF 05/15/2008  . Anxiety and depression    . MVP (mitral valve prolapse)     occ palpitations   . Shingles     Past Surgical History  Procedure Laterality Date  . Hand surgery  1996  . Tubal ligation  1980  . Hysteroscopy  2004    D&C  . Dilation and curettage of uterus  2004    WITH HYSTEROSCOPY  . Neck surgery  2009    SPURS  . Cyst on neck  2011  . Abdominal hysterectomy  08/22/2012    Procedure: HYSTERECTOMY ABDOMINAL;  Surgeon: Alvino Chapel, MD;  Location: [facility];  Service: Gynecology;  Laterality: N/A;  . Salpingoophorectomy  08/22/2012    Procedure: SALPINGO OOPHORECTOMY;  Surgeon: Alvino Chapel, MD;  Location: [facility];  Service: Gynecology;  Laterality: Bilateral;  . Omentectomy  08/22/2012    Procedure: OMENTECTOMY;  Surgeon: Alvino Chapel, MD;  Location: [facility];   Service: Gynecology;  Laterality: N/A;  . Debulking  08/22/2012    Procedure: DEBULKING;  Surgeon: Alvino Chapel, MD;  Location: [facility];  Service: Gynecology;  Laterality: N/A;  Radical tumor debulking, Bilateral Ureterolysis  . Colostomy revision  08/22/2012    Procedure: COLON RESECTION SIGMOID;  Surgeon: Alvino Chapel, MD;  Location: [facility];  Service: Gynecology;;  Rectal Sigmoid resection and low rectal anastomosis  . Back surgery  2008    Dr Trenton Gammon  . Colonoscopy  04-20-2004  . Colonoscopy w/ biopsies  07/2014    History   Social History  . Marital Status: Married    Spouse Name: N/A    Number of Children: 2  . Years of Education: N/A   Occupational History  . disability d/t RA    Social History Main Topics  . Smoking status: Never Smoker   . Smokeless tobacco: Never Used  . Alcohol Use: No  . Drug Use: No  . Sexual Activity: No   Other Topics Concern  . Not on file   Social History Narrative   [name] is her daughter    Household-- pr and husband     Family History  Problem Relation Age of Onset  . Heart disease Mother   . Osteoporosis Sister   . Bladder Cancer Father 61  . Lymphoma Paternal Aunt   . Colon cancer Neg Hx   .  Rectal cancer Neg Hx   . Stomach cancer Neg Hx        Medication List       This list is accurate as of: 10/18/14 11:59 PM.  Always use your most recent med list.               calcium-vitamin D 500-200 MG-UNIT per tablet  Commonly known as:  OSCAL WITH D  Take 1 tablet by mouth daily.     docusate sodium 100 MG capsule  Commonly known as:  COLACE  Take 100 mg by mouth daily.     escitalopram 20 MG tablet  Commonly known as:  LEXAPRO  Take 20 mg by mouth daily. Take one and 1/2 tablet in the morning     estazolam 2 MG tablet  Commonly known as:  PROSOM  Take 2 mg by mouth at bedtime.     fish oil-omega-3 fatty acids 1000 MG capsule  Take 1 g by mouth daily.     Flax Seed Oil 1000 MG Caps   Take 1 capsule by mouth daily.     Melatonin 3 MG Tabs  Take 1 tablet by mouth at bedtime.     multivitamin with minerals Tabs tablet  Take 1 tablet by mouth daily.     pravastatin 40 MG tablet  Commonly known as:  PRAVACHOL  Take 1 tablet (40 mg total) by mouth daily.           Objective:   Physical Exam BP 107/74 mmHg  Pulse 99  Temp(Src) 97.7 F (36.5 C) (Oral)  Ht 5\' 4"  (1.626 m)  Wt 125 lb 2 oz (56.756 kg)  BMI 21.47 kg/m2  SpO2 100%  General -- alert, well-developed, NAD.  Neck --no thyromegaly  HEENT-- Not pale.    Lungs -- normal respiratory effort, no intercostal retractions, no accessory muscle use, and normal breath sounds.  Heart-- normal rate, regular rhythm, no murmur.  Abdomen-- Not distended, good bowel sounds,soft, slt tender @ lower abd w/o mass -rebound ("since I had the surgery") No rebound or rigidity. No mass,organomegaly. Extremities-- no pretibial edema bilaterally  Neurologic--  alert & oriented X3. Speech normal, gait appropriate for age, strength symmetric and appropriate for age.  Psych-- Cognition and judgment appear intact. Cooperative with normal attention span and concentration. No anxious or depressed appearing.        Assessment & Plan:

## 2014-10-21 LAB — URINE CULTURE: Colony Count: >=100000

## 2014-10-22 ENCOUNTER — Other Ambulatory Visit: Payer: Self-pay | Admitting: Internal Medicine

## 2014-10-22 DIAGNOSIS — Z1231 Encounter for screening mammogram for malignant neoplasm of breast: Secondary | ICD-10-CM

## 2014-10-22 MED ORDER — SULFAMETHOXAZOLE-TRIMETHOPRIM 800-160 MG PO TABS: 1.0000 | ORAL_TABLET | Freq: Two times a day (BID) | ORAL | Status: DC

## 2014-10-22 NOTE — Addendum Note (Signed)
Addended by: Wilfrid Lund on: 10/22/2014 11:28 AM   Modules accepted: Orders

## 2014-10-30 ENCOUNTER — Ambulatory Visit (HOSPITAL_COMMUNITY): Payer: Medicare Other

## 2014-10-30 ENCOUNTER — Ambulatory Visit (HOSPITAL_COMMUNITY)
Admission: RE | Admit: 2014-10-30 | Discharge: 2014-10-30 | Disposition: A | Discharge status: Home / Self Care | Payer: Medicare Other | Source: Ambulatory Visit | Attending: Internal Medicine | Admitting: Internal Medicine

## 2014-10-30 DIAGNOSIS — Z1231 Encounter for screening mammogram for malignant neoplasm of breast: Secondary | ICD-10-CM | POA: Insufficient documentation

## 2014-11-07 ENCOUNTER — Other Ambulatory Visit (HOSPITAL_BASED_OUTPATIENT_CLINIC_OR_DEPARTMENT_OTHER): Payer: Medicare Other

## 2014-11-07 DIAGNOSIS — C569 Malignant neoplasm of unspecified ovary: Secondary | ICD-10-CM

## 2014-11-08 ENCOUNTER — Telehealth: Payer: Self-pay | Admitting: *Deleted

## 2014-11-08 ENCOUNTER — Other Ambulatory Visit: Payer: Self-pay | Admitting: *Deleted

## 2014-11-08 ENCOUNTER — Ambulatory Visit (HOSPITAL_BASED_OUTPATIENT_CLINIC_OR_DEPARTMENT_OTHER): Payer: Medicare Other

## 2014-11-08 ENCOUNTER — Other Ambulatory Visit (HOSPITAL_COMMUNITY)
Admission: RE | Admit: 2014-11-08 | Discharge: 2014-11-08 | Disposition: A | Discharge status: Home / Self Care | Payer: Medicare Other | Source: Ambulatory Visit | Attending: Gynecology | Admitting: Gynecology

## 2014-11-08 ENCOUNTER — Telehealth: Payer: Self-pay | Admitting: Gynecologic Oncology

## 2014-11-08 ENCOUNTER — Encounter: Payer: Self-pay | Admitting: Gynecology

## 2014-11-08 ENCOUNTER — Ambulatory Visit: Payer: Medicare Other | Attending: Gynecology | Admitting: Gynecology

## 2014-11-08 VITALS — BP 106/65 | HR 78 | Temp 98.2°F | Resp 18 | Ht 65.0 in | Wt 128.8 lb

## 2014-11-08 DIAGNOSIS — R300 Dysuria: Secondary | ICD-10-CM

## 2014-11-08 DIAGNOSIS — C569 Malignant neoplasm of unspecified ovary: Secondary | ICD-10-CM

## 2014-11-08 DIAGNOSIS — K5900 Constipation, unspecified: Secondary | ICD-10-CM | POA: Diagnosis not present

## 2014-11-08 DIAGNOSIS — Z90722 Acquired absence of ovaries, bilateral: Secondary | ICD-10-CM | POA: Insufficient documentation

## 2014-11-08 DIAGNOSIS — Z90710 Acquired absence of both cervix and uterus: Secondary | ICD-10-CM | POA: Diagnosis not present

## 2014-11-08 DIAGNOSIS — Z01411 Encounter for gynecological examination (general) (routine) with abnormal findings: Secondary | ICD-10-CM | POA: Diagnosis present

## 2014-11-08 DIAGNOSIS — Z08 Encounter for follow-up examination after completed treatment for malignant neoplasm: Secondary | ICD-10-CM | POA: Insufficient documentation

## 2014-11-08 DIAGNOSIS — Z8543 Personal history of malignant neoplasm of ovary: Secondary | ICD-10-CM | POA: Diagnosis not present

## 2014-11-08 DIAGNOSIS — Z9221 Personal history of antineoplastic chemotherapy: Secondary | ICD-10-CM | POA: Insufficient documentation

## 2014-11-08 DIAGNOSIS — F419 Anxiety disorder, unspecified: Secondary | ICD-10-CM | POA: Insufficient documentation

## 2014-11-08 DIAGNOSIS — N390 Urinary tract infection, site not specified: Secondary | ICD-10-CM | POA: Diagnosis not present

## 2014-11-08 DIAGNOSIS — F329 Major depressive disorder, single episode, unspecified: Secondary | ICD-10-CM | POA: Diagnosis not present

## 2014-11-08 LAB — URINALYSIS, MICROSCOPIC - CHCC
BLOOD: NEGATIVE
Bilirubin (Urine): NEGATIVE
GLUCOSE UR CHCC: NEGATIVE mg/dL
Ketones: NEGATIVE mg/dL
NITRITE: NEGATIVE
Protein: NEGATIVE mg/dL
SPECIFIC GRAVITY, URINE: 1.03 (ref 1.003–1.035)
UROBILINOGEN UR: 0.2 mg/dL (ref 0.2–1)
pH: 5 (ref 4.6–8.0)

## 2014-11-08 LAB — CA 125: CA 125: 7 U/mL (ref <35)

## 2014-11-08 LAB — CA 125(PREVIOUS METHOD): CA 125: 3.5 U/mL (ref 0.0–30.2)

## 2014-11-08 NOTE — Telephone Encounter (Signed)
Pt informed of UA results.  Advised to call if symptoms persisted or worsened.

## 2014-11-08 NOTE — Telephone Encounter (Signed)
-----   Message from Ladell Pier, MD sent at 11/07/2014  5:54 PM EST ----- Please call patient, ca125 is normal, 2/15 appt. Needs to be moved due to call day, I requested this last month

## 2014-11-08 NOTE — Telephone Encounter (Signed)
Per Dr. Benay Spice; notified pt that ca125 is normal and scheduler will be in contact with new appt date/time.  Pt verbalized understanding.

## 2014-11-08 NOTE — Patient Instructions (Signed)
We will call you with the urinalysis results. Followup with Dr. Fermin Schwab in 3 months. Please call us sooner with any questions or concerns.

## 2014-11-08 NOTE — Progress Notes (Signed)
Consult Note: Gyn-Onc   [name] [name] 64 y.o. female  Chief Complaint  Patient presents with  . Ovarian Cancer    Assessment: stage IIIc ovarian cancer (optimally debulked). Status post 6 cycles of carboplatin and Taxol given in a dose dense regimencompleted May 2014.[name] She is clinically free of disease. Chronic constipation. Reason urinary tract infection. Recent history of vaginal spotting on 2 occasions. No lesions are noted.  Plan  We will plan on seeing the patient in 3 months and obtain a CA 125 just prior to that visit. The patient continues to [facility] for constipation. A urine cultures obtained today to be certain that her urinary tract infection is resolved. She will continue to have annual mammograms. Interval History:  Patient returns as previously scheduled for followup. Since her last visit she's done well. She had a recent CA 125 value of 5 units per mL  Patient seems to be doing well except for chronic constipation and some lower abdominal discomfort. . She has no GI or GU symptoms.  . She has no pelvic pain pressure vaginal discharge. She does note that she had 2 episodes of vaginal spotting over the past 3 months. She also had a urinary tract infection recently and was treated for 6 days. The patient is concerned that she is uncertain as to whether the infection has resolved although she is asymptomatic.    POE:UMPNTIR is a 64 year old gravida 2 para 2 who went through menopause at the age about 64 years of age. She never took any hormone replacement therapy. She states that for the past 6 months she's experienced some pelvic soreness in pressure. She has noted some increasing urinary frequency. With these symptoms she had a PET scan on October 9. It revealed significant soft tissue thickening seen throughout the greater omentum suspicious for intraperitoneal carcinoma. There is minimal ascites in the pelvis. it revealed the areas of omental and peritoneal nodularity  were hypermetabolic. There is a dominant pelvic omental mass measuring 10.4 x 3.3 cm. There is a small omental implants in the left side of the abdomen measuring 1.1 cm. There were multiple pelvic foci That wereperitoneal based And hypermetabolic. These included the cul-de-sac. She has small volume of ascites. She underwent a CT-guided biopsy on July 24, 2012. It revealed metastatic carcinoma. By immunohistochemistry, malignant cells were positive for WT 1, cytokeratin 7, estrogen receptor. There were negative for TTF-1, progesterone receptor, cytokeratin 20, CEA, CD X2, and S100. Overall, the histologic features were highly suggestive of a primary gynecologic origin. Her CA 125 on October 10 was elevated at 69.  She underwent exploratory laparotomy on 08/22/2012 findings of stage IIIC ovarian cancer. Patient was optimally debulked. Debulking, however, required a rectosigmoid resection as well as total omentectomy total nominal hysterectomy bilateral salpingo-oophorectomy. She was optimally debulked with only 5 mm nodules on the right diaphragm as residual disease.  SURGICAL FINDINGS: At exploratory laparotomy the infracolic omentum was nearly replaced with tumor. This measured approximately 12 x 16 cm. There were 5 mm nodules on the right diaphragm. The small bowel serosa and mesentery were normal. In the pelvis it appeared that the left ovary was a primary source. It was cystic and densely adherent to the sigmoid mesentery. The right ovary was approximately 4 cm in diameter and had multiple excrescences on it. There were extensive tumor implants on the bladder flap and involving the sigmoid mesentery and serosa, and posterior cul-de-sac. At the conclusion of the surgical procedure the only residual disease were  5 mm nodules on the right diaphragm. Chemotherapy was initiated in December 2013 using the dose dense regimen of carboplatin and Taxol   She received 6 cycles of carboplatin and Taxol the last  administered on 02/06/2013. At that time her CA 125 is 5.6 units per mL.  Review of Systems:10 point review of systems is negative as noted above.   Vitals: Blood pressure 106/65, pulse 78, temperature 98.2 F (36.8 C), temperature source Oral, resp. rate 18, height 5\' 5"  (1.651 m), weight 128 lb 12.8 oz (58.423 kg).  Physical Exam: General : The patient is a healthy woman in no acute distress.  HEENT: normocephalic, extraoccular movements normal; neck is supple without thyromegally  Lynphnodes: Supraclavicular and inguinal nodes not enlarged Breasts are without masses, skin changes, or nipple discharge. Axillary nodes are not enlarged.  Abdomen: Soft, non-tender, no ascites, no organomegally, no masses, no hernias  Pelvic:  EGBUS: Normal female  Vagina: Normal, no lesions no evidence of bleeding. Urethra and Bladder: Normal, non-tender  Cervix: Surgically absent  Uterus: Surgically absent   bi-manual examination: Non-tender; no adenxal masses or nodularity  Rectal: normal sphincter tone, no masses, no blood  Lower extremities: No edema or varicosities. Normal range of motion      Allergies  Allergen Reactions  . Atorvastatin     REACTION: causes leg pain  . Decongest-Aid [Pseudoephedrine]     Makes my heart flutter  . Macrodantin     Per pt: unknown    Past Medical History  Diagnosis Date  . Rheumatoid arthritis(714.0) ~ 2010    dr Ouida Sills  . Endometrial polyp   . Elevated cholesterol   . Ovarian ca 07-2012       . DJD (degenerative joint disease)   . IRRITABLE BOWEL SYNDROME, HX OF 05/15/2008  . Anxiety and depression    . MVP (mitral valve prolapse)     occ palpitations   . Shingles     Past Surgical History  Procedure Laterality Date  . Hand surgery  1996  . Tubal ligation  1980  . Hysteroscopy  2004    D&C  . Dilation and curettage of uterus  2004    WITH HYSTEROSCOPY  . Neck surgery  2009    SPURS  . Cyst on neck  2011  . Abdominal hysterectomy   08/22/2012    Procedure: HYSTERECTOMY ABDOMINAL;  Surgeon: Alvino Chapel, MD;  Location: [facility];  Service: Gynecology;  Laterality: N/A;  . Salpingoophorectomy  08/22/2012    Procedure: SALPINGO OOPHORECTOMY;  Surgeon: Alvino Chapel, MD;  Location: [facility];  Service: Gynecology;  Laterality: Bilateral;  . Omentectomy  08/22/2012    Procedure: OMENTECTOMY;  Surgeon: Alvino Chapel, MD;  Location: [facility];  Service: Gynecology;  Laterality: N/A;  . Debulking  08/22/2012    Procedure: DEBULKING;  Surgeon: Alvino Chapel, MD;  Location: [facility];  Service: Gynecology;  Laterality: N/A;  Radical tumor debulking, Bilateral Ureterolysis  . Colostomy revision  08/22/2012    Procedure: COLON RESECTION SIGMOID;  Surgeon: Alvino Chapel, MD;  Location: [facility];  Service: Gynecology;;  Rectal Sigmoid resection and low rectal anastomosis  . Back surgery  2008    Dr Trenton Gammon  . Colonoscopy  04-20-2004  . Colonoscopy w/ biopsies  07/2014    Current Outpatient Prescriptions  Medication Sig Dispense Refill  . calcium-vitamin D (OSCAL WITH D) 500-200 MG-UNIT per tablet Take 1 tablet by mouth daily.      [name] docusate  sodium (COLACE) 100 MG capsule Take 100 mg by mouth daily.    [name] escitalopram (LEXAPRO) 20 MG tablet Take 20 mg by mouth daily. Take one and 1/2 tablet in the morning    . fish oil-omega-3 fatty acids 1000 MG capsule Take 1 g by mouth daily.      . Flaxseed, Linseed, (FLAX SEED OIL) 1000 MG CAPS Take 1 capsule by mouth daily.    . Melatonin 3 MG TABS Take 1 tablet by mouth at bedtime.    . Multiple Vitamin (MULTIVITAMIN WITH MINERALS) TABS Take 1 tablet by mouth daily.    . pravastatin (PRAVACHOL) 40 MG tablet Take 1 tablet (40 mg total) by mouth daily. 90 tablet 3  . estazolam (PROSOM) 2 MG tablet Take 2 mg by mouth at bedtime.     No current facility-administered medications for this visit.   Facility-Administered Medications Ordered in Other Visits   Medication Dose Route Frequency Provider Last Rate Last Dose  . sodium chloride 0.9 % injection 10 mL  10 mL Intracatheter PRN Ladell Pier, MD   10 mL at 01/16/13 1359    History   Social History  . Marital Status: Married    Spouse Name: N/A    Number of Children: 2  . Years of Education: N/A   Occupational History  . disability d/t RA    Social History Main Topics  . Smoking status: Never Smoker   . Smokeless tobacco: Never Used  . Alcohol Use: No  . Drug Use: No  . Sexual Activity: No   Other Topics Concern  . Not on file   Social History Narrative   [name] [name] is her daughter    Household-- pr and husband    Family History  Problem Relation Age of Onset  . Heart disease Mother   . Osteoporosis Sister   . Bladder Cancer Father 34  . Lymphoma Paternal Aunt   . Colon cancer Neg Hx   . Rectal cancer Neg Hx   . Stomach cancer Neg Hx       CLARKE-PEARSON,[name] Armbrister L, MD 11/08/2014, 11:04 AM

## 2014-11-08 NOTE — Addendum Note (Signed)
Addended by: Joylene John D on: 11/08/2014 03:14 PM   Modules accepted: Orders

## 2014-11-12 LAB — URINE CULTURE

## 2014-11-12 LAB — CYTOLOGY - PAP

## 2014-11-14 ENCOUNTER — Telehealth: Payer: Self-pay | Admitting: *Deleted

## 2014-11-14 ENCOUNTER — Other Ambulatory Visit: Payer: Medicare Other

## 2014-11-14 NOTE — Telephone Encounter (Signed)
Called pt concerning urine culture. Pt states "I'm doing good, I don't have any problems."  Informed pt to call office if she has any concerns. Pt verbalized understanding

## 2014-11-14 NOTE — Telephone Encounter (Signed)
Called pt notified Pap smear normal. Pt verbalized understanding. No concerns at this time.

## 2014-11-18 ENCOUNTER — Ambulatory Visit: Payer: Medicare Other | Admitting: Oncology

## 2014-11-19 ENCOUNTER — Ambulatory Visit: Payer: Medicare Other | Admitting: Oncology

## 2014-11-26 ENCOUNTER — Telehealth: Payer: Self-pay | Admitting: Internal Medicine

## 2014-11-26 DIAGNOSIS — R350 Frequency of micturition: Secondary | ICD-10-CM

## 2014-11-26 NOTE — Telephone Encounter (Signed)
Caller name: Areana, Kosanke Relation to pt: self  Call back number: [phone]   Reason for call:  Pt is exp. frequent urination and lower back ach, pt would like to dropp off urine at [place] office, requesting orders.

## 2014-11-26 NOTE — Telephone Encounter (Signed)
Please advise. Not sure if [facility] needs appts for drop off or not?

## 2014-11-26 NOTE — Telephone Encounter (Signed)
Ok for UA and a urine culture DX urinary frequency  We'll wait for results before prescribing medication

## 2014-11-27 ENCOUNTER — Telehealth: Payer: Self-pay | Admitting: Internal Medicine

## 2014-11-27 ENCOUNTER — Other Ambulatory Visit (INDEPENDENT_AMBULATORY_CARE_PROVIDER_SITE_OTHER): Payer: Medicare Other

## 2014-11-27 DIAGNOSIS — R350 Frequency of micturition: Secondary | ICD-10-CM

## 2014-11-27 LAB — URINALYSIS, ROUTINE W REFLEX MICROSCOPIC
Bilirubin Urine: NEGATIVE
KETONES UR: NEGATIVE
Nitrite: NEGATIVE
SPECIFIC GRAVITY, URINE: 1.01 (ref 1.000–1.030)
TOTAL PROTEIN, URINE-UPE24: NEGATIVE
URINE GLUCOSE: NEGATIVE
Urobilinogen, UA: 0.2 (ref 0.0–1.0)
pH: 6 (ref 5.0–8.0)

## 2014-11-27 NOTE — Telephone Encounter (Signed)
UA and Urine culture ordered. Informed Pt she may need to call [place] office to make sure she does not need appt for the lab to drop off urine. Pt verbalized understanding.

## 2014-11-27 NOTE — Telephone Encounter (Signed)
Urine results

## 2014-11-29 ENCOUNTER — Telehealth: Payer: Self-pay | Admitting: Internal Medicine

## 2014-11-29 MED ORDER — SULFAMETHOXAZOLE-TRIMETHOPRIM 800-160 MG PO TABS: 1.0000 | ORAL_TABLET | Freq: Two times a day (BID) | ORAL | Status: DC

## 2014-11-29 NOTE — Telephone Encounter (Signed)
Spoke with Pt, informed her that UA was back however, urine culture is not. Informed her of Dr. Larose Kells recommendations. Bactrim sent to [place] as requested. Informed her if not feeling better she will need an office visit. Pt verbalized understanding.

## 2014-11-29 NOTE — Telephone Encounter (Signed)
Caller name: Karaline Relation to pt: self Call back number: [phone] Pharmacy:  Reason for call:   Requesting last lab results

## 2014-11-29 NOTE — Telephone Encounter (Signed)
UA c/w UTI, UCX pending. Chart reviewed, she had a urine culture positive for Escherichia coli earlier this month. Plan: Bactrim DS 1 by mouth twice a day 3 days. If not better needs office visit

## 2014-11-29 NOTE — Telephone Encounter (Signed)
Please advise 

## 2014-12-02 LAB — URINE CULTURE

## 2014-12-03 MED ORDER — AMOXICILLIN 500 MG PO TABS: ORAL_TABLET | ORAL | Status: DC

## 2014-12-03 NOTE — Addendum Note (Signed)
Addended by: Wilfrid Lund on: 12/03/2014 11:31 AM   Modules accepted: Orders

## 2014-12-25 ENCOUNTER — Telehealth: Payer: Self-pay | Admitting: Internal Medicine

## 2014-12-25 ENCOUNTER — Other Ambulatory Visit: Payer: Self-pay | Admitting: Nurse Practitioner

## 2014-12-25 DIAGNOSIS — C569 Malignant neoplasm of unspecified ovary: Secondary | ICD-10-CM

## 2014-12-25 NOTE — Telephone Encounter (Signed)
Caller name:Lashway, Adrienne Relation to XY:IAXK Call back number:[phone] Pharmacy:[facility]  Reason for call: pt is needing rx pravastatin (PRAVACHOL) 40 MG tablet .

## 2014-12-26 ENCOUNTER — Other Ambulatory Visit: Payer: Self-pay

## 2014-12-26 MED ORDER — PRAVASTATIN SODIUM 40 MG PO TABS: 40.0000 mg | ORAL_TABLET | Freq: Every day | ORAL | Status: DC

## 2014-12-26 NOTE — Telephone Encounter (Signed)
Pravastatin refilled to [facility] as requested.

## 2015-01-08 ENCOUNTER — Other Ambulatory Visit: Payer: Self-pay

## 2015-02-18 ENCOUNTER — Encounter: Payer: Self-pay | Admitting: Internal Medicine

## 2015-02-18 ENCOUNTER — Ambulatory Visit (INDEPENDENT_AMBULATORY_CARE_PROVIDER_SITE_OTHER): Payer: Medicare Other | Admitting: Internal Medicine

## 2015-02-18 VITALS — BP 120/64 | HR 62 | Temp 97.6°F | Ht 65.0 in | Wt 130.5 lb

## 2015-02-18 DIAGNOSIS — N390 Urinary tract infection, site not specified: Secondary | ICD-10-CM

## 2015-02-18 DIAGNOSIS — B3749 Other urogenital candidiasis: Secondary | ICD-10-CM

## 2015-02-18 DIAGNOSIS — F418 Other specified anxiety disorders: Secondary | ICD-10-CM | POA: Diagnosis not present

## 2015-02-18 DIAGNOSIS — E7800 Pure hypercholesterolemia, unspecified: Secondary | ICD-10-CM

## 2015-02-18 DIAGNOSIS — F419 Anxiety disorder, unspecified: Secondary | ICD-10-CM

## 2015-02-18 DIAGNOSIS — F32A Depression, unspecified: Secondary | ICD-10-CM

## 2015-02-18 DIAGNOSIS — F329 Major depressive disorder, single episode, unspecified: Secondary | ICD-10-CM

## 2015-02-18 LAB — URINALYSIS, ROUTINE W REFLEX MICROSCOPIC
Bilirubin Urine: NEGATIVE
Hgb urine dipstick: NEGATIVE
Ketones, ur: NEGATIVE
Nitrite: NEGATIVE
PH: 7 (ref 5.0–8.0)
Specific Gravity, Urine: 1.015 (ref 1.000–1.030)
Total Protein, Urine: NEGATIVE
Urine Glucose: NEGATIVE
Urobilinogen, UA: 0.2 (ref 0.0–1.0)

## 2015-02-18 NOTE — Progress Notes (Signed)
Subjective:    Patient ID: [name] [name], female    DOB: [birth year], 64 y.o.   MRN: [id number]  DOS:  02/18/2015 Type of visit - description : f/u from previous visit Interval history: High cholesterol, good compliance with medication.  Depression, now under the care of another provider  , symptoms are about the same. She complained of vaginal bleeding, already saw gynecology Since the last visit was diagnosed with a UTI, status post antibiotics, better but still reports a very peculiar urinary  smell, think she has a UTI again   Review of Systems Denies fever chills No nausea vomiting No flank pain No suicidal ideas No actual dysuria or gross hematuria  Past Medical History  Diagnosis Date  . Rheumatoid arthritis(714.0) ~ 2010    dr Ouida Sills  . Endometrial polyp   . Elevated cholesterol   . Ovarian ca 07-2012       . DJD (degenerative joint disease)   . IRRITABLE BOWEL SYNDROME, HX OF 05/15/2008  . Anxiety and depression    . MVP (mitral valve prolapse)     occ palpitations   . Shingles     Past Surgical History  Procedure Laterality Date  . Hand surgery  1996  . Tubal ligation  1980  . Hysteroscopy  2004    D&C  . Dilation and curettage of uterus  2004    WITH HYSTEROSCOPY  . Neck surgery  2009    SPURS  . Cyst on neck  2011  . Abdominal hysterectomy  08/22/2012    Procedure: HYSTERECTOMY ABDOMINAL;  Surgeon: Alvino Chapel, MD;  Location: [facility];  Service: Gynecology;  Laterality: N/A;  . Salpingoophorectomy  08/22/2012    Procedure: SALPINGO OOPHORECTOMY;  Surgeon: Alvino Chapel, MD;  Location: [facility];  Service: Gynecology;  Laterality: Bilateral;  . Omentectomy  08/22/2012    Procedure: OMENTECTOMY;  Surgeon: Alvino Chapel, MD;  Location: [facility];  Service: Gynecology;  Laterality: N/A;  . Debulking  08/22/2012    Procedure: DEBULKING;  Surgeon: Alvino Chapel, MD;  Location: [facility];  Service: Gynecology;  Laterality:  N/A;  Radical tumor debulking, Bilateral Ureterolysis  . Colostomy revision  08/22/2012    Procedure: COLON RESECTION SIGMOID;  Surgeon: Alvino Chapel, MD;  Location: [facility];  Service: Gynecology;;  Rectal Sigmoid resection and low rectal anastomosis  . Back surgery  2008    Dr Trenton Gammon  . Colonoscopy  04-20-2004  . Colonoscopy w/ biopsies  07/2014    History   Social History  . Marital Status: Married    Spouse Name: N/A  . Number of Children: 2  . Years of Education: N/A   Occupational History  . disability d/t RA    Social History Main Topics  . Smoking status: Never Smoker   . Smokeless tobacco: Never Used  . Alcohol Use: No  . Drug Use: No  . Sexual Activity: No   Other Topics Concern  . Not on file   Social History Narrative   [name] is her daughter    Household-- pr and husband        Medication List       This list is accurate as of: 02/18/15  9:17 PM.  Always use your most recent med list.               calcium-vitamin D 500-200 MG-UNIT per tablet  Commonly known as:  OSCAL WITH D  Take 1 tablet by  mouth daily.     docusate sodium 100 MG capsule  Commonly known as:  COLACE  Take 100 mg by mouth daily.     escitalopram 20 MG tablet  Commonly known as:  LEXAPRO  Take 20 mg by mouth daily. Take one and 1/2 tablet in the morning     fish oil-omega-3 fatty acids 1000 MG capsule  Take 1 g by mouth daily.     Flax Seed Oil 1000 MG Caps  Take 1 capsule by mouth daily.     Melatonin 3 MG Tabs  Take 1 tablet by mouth at bedtime.     multivitamin with minerals Tabs tablet  Take 1 tablet by mouth daily.     pravastatin 40 MG tablet  Commonly known as:  PRAVACHOL  Take 1 tablet (40 mg total) by mouth daily.           Objective:   Physical Exam BP 120/64 mmHg  Pulse 62  Temp(Src) 97.6 F (36.4 C) (Oral)  Ht 5\' 5"  (1.651 m)  Wt 130 lb 8 oz (59.194 kg)  BMI 21.72 kg/m2  SpO2 96%  General:   Well developed, well  nourished . NAD.  HEENT:  Normocephalic . Face symmetric, atraumatic Abdomen:  Not distended, soft, non-tender. No rebound or rigidity. No mass,organomegaly Skin: Not pale. Not jaundice Neurologic:  alert & oriented X3.  Speech normal, gait appropriate for age and unassisted Psych--  Cognition and judgment appear intact.  Cooperative with normal attention span and concentration.  Behavior appropriate. No anxious or depressed appearing.      Assessment & Plan:     Anxiety, depression On Lexapro 20 mg, sees a counselor named Lattie Haw, reportedly her office is [facility] Lex[facility]. Symptoms are ~ the same as before, not completely well, reminded the patient that there is other options including Wellbutrin. Plan: Continue Lexapro, counseling, if symptoms not optimally controlled recommend to discuss with Lattie Haw or call here.   UTI,  last 3 urine cultures positive for Escherichia coli, will recheck a culture today, if still positive will be refered to urology  High cholesterol: Last cholesterol panel very good, continue with medications.

## 2015-02-18 NOTE — Patient Instructions (Signed)
Go to the lab for a urine sample  Next visit 6 months

## 2015-02-18 NOTE — Progress Notes (Signed)
Pre visit review using our clinic review tool, if applicable. No additional management support is needed unless otherwise documented below in the visit note. 

## 2015-02-20 LAB — URINE CULTURE
Colony Count: NO GROWTH
ORGANISM ID, BACTERIA: NO GROWTH

## 2015-02-27 ENCOUNTER — Other Ambulatory Visit: Payer: Medicare Other

## 2015-02-27 DIAGNOSIS — C569 Malignant neoplasm of unspecified ovary: Secondary | ICD-10-CM

## 2015-02-28 ENCOUNTER — Encounter: Payer: Self-pay | Admitting: Gynecology

## 2015-02-28 ENCOUNTER — Ambulatory Visit: Payer: Medicare Other | Attending: Gynecology | Admitting: Gynecology

## 2015-02-28 VITALS — BP 108/68 | HR 74 | Temp 98.1°F | Resp 18 | Ht 65.0 in | Wt 135.6 lb

## 2015-02-28 DIAGNOSIS — Z8543 Personal history of malignant neoplasm of ovary: Secondary | ICD-10-CM | POA: Insufficient documentation

## 2015-02-28 DIAGNOSIS — F419 Anxiety disorder, unspecified: Secondary | ICD-10-CM | POA: Diagnosis not present

## 2015-02-28 DIAGNOSIS — K5900 Constipation, unspecified: Secondary | ICD-10-CM | POA: Insufficient documentation

## 2015-02-28 DIAGNOSIS — Z90710 Acquired absence of both cervix and uterus: Secondary | ICD-10-CM | POA: Insufficient documentation

## 2015-02-28 DIAGNOSIS — Z9049 Acquired absence of other specified parts of digestive tract: Secondary | ICD-10-CM | POA: Insufficient documentation

## 2015-02-28 DIAGNOSIS — F329 Major depressive disorder, single episode, unspecified: Secondary | ICD-10-CM | POA: Insufficient documentation

## 2015-02-28 DIAGNOSIS — Z9221 Personal history of antineoplastic chemotherapy: Secondary | ICD-10-CM | POA: Insufficient documentation

## 2015-02-28 DIAGNOSIS — C569 Malignant neoplasm of unspecified ovary: Secondary | ICD-10-CM

## 2015-02-28 DIAGNOSIS — Z90722 Acquired absence of ovaries, bilateral: Secondary | ICD-10-CM | POA: Diagnosis not present

## 2015-02-28 DIAGNOSIS — Z08 Encounter for follow-up examination after completed treatment for malignant neoplasm: Secondary | ICD-10-CM | POA: Diagnosis present

## 2015-02-28 LAB — CA 125: CA 125: 5 U/mL (ref <35)

## 2015-02-28 NOTE — Progress Notes (Signed)
Consult Note: Gyn-Onc   [name] [name] 64 y.o. female  Chief Complaint  Patient presents with  . Ovarian Cancer    Assessment: stage IIIc ovarian cancer (optimally debulked). Status post 6 cycles of carboplatin and Taxol given in a dose dense regimencompleted May 2014.[name] She is clinically free of disease.  Plan  We will plan on seeing the patient in 6 months and obtain a CA 125 just prior to that visit. The patient continues to [facility] for constipation.   She will continue to have annual mammograms. The patient is encouraged to limit her caloric intake and increase exercise and to avoid continued weight gain. Interval History:  Patient returns as previously scheduled for followup. Since her last visit she's done well. She had a recent CA 125 value of 5 units per mL   . She has no GI or GU symptoms.  . She has no pelvic pain pressure vaginal discharge.  . She also had a urinary tract infection recently and was treated for 6 days.  She had mammograms recently which were normal. Functional status is excellent. It is noted that she's gained 7 pounds since February.   [id number] is a 64 year old gravida 2 para 2 who went through menopause at the age about 64 years of age. She never took any hormone replacement therapy. She states that for the past 6 months she's experienced some pelvic soreness in pressure. She has noted some increasing urinary frequency. With these symptoms she had a PET scan on October 9. It revealed significant soft tissue thickening seen throughout the greater omentum suspicious for intraperitoneal carcinoma. There is minimal ascites in the pelvis. it revealed the areas of omental and peritoneal nodularity were hypermetabolic. There is a dominant pelvic omental mass measuring 10.4 x 3.3 cm. There is a small omental implants in the left side of the abdomen measuring 1.1 cm. There were multiple pelvic foci That wereperitoneal based And hypermetabolic. These included the  cul-de-sac. She has small volume of ascites. She underwent a CT-guided biopsy on July 24, 2012. It revealed metastatic carcinoma. By immunohistochemistry, malignant cells were positive for WT 1, cytokeratin 7, estrogen receptor. There were negative for TTF-1, progesterone receptor, cytokeratin 20, CEA, CD X2, and S100. Overall, the histologic features were highly suggestive of a primary gynecologic origin. Her CA 125 on October 10 was elevated at 69.  She underwent exploratory laparotomy on 08/22/2012 findings of stage IIIC ovarian cancer. Patient was optimally debulked. Debulking, however, required a rectosigmoid resection as well as total omentectomy total nominal hysterectomy bilateral salpingo-oophorectomy. She was optimally debulked with only 5 mm nodules on the right diaphragm as residual disease.  SURGICAL FINDINGS: At exploratory laparotomy the infracolic omentum was nearly replaced with tumor. This measured approximately 12 x 16 cm. There were 5 mm nodules on the right diaphragm. The small bowel serosa and mesentery were normal. In the pelvis it appeared that the left ovary was a primary source. It was cystic and densely adherent to the sigmoid mesentery. The right ovary was approximately 4 cm in diameter and had multiple excrescences on it. There were extensive tumor implants on the bladder flap and involving the sigmoid mesentery and serosa, and posterior cul-de-sac. At the conclusion of the surgical procedure the only residual disease were 5 mm nodules on the right diaphragm. Chemotherapy was initiated in December 2013 using the dose dense regimen of carboplatin and Taxol   She received 6 cycles of carboplatin and Taxol the last administered on 02/06/2013. At that  time her CA 125 is 5.6 units per mL.  Review of Systems:10 point review of systems is negative as noted above.   Vitals: Blood pressure 108/68, pulse 74, temperature 98.1 F (36.7 C), temperature source Oral, resp. rate 18, height  5\' 5"  (1.651 m), weight 135 lb 9.6 oz (61.508 kg).  Physical Exam: General : The patient is a healthy woman in no acute distress.  HEENT: normocephalic, extraoccular movements normal; neck is supple without thyromegally  Lynphnodes: Supraclavicular and inguinal nodes not enlarged Breasts are without masses, skin changes, or nipple discharge. Axillary nodes are not enlarged.  Abdomen: Soft, non-tender, no ascites, no organomegally, no masses, no hernias  Pelvic:  EGBUS: Normal female  Vagina: Normal, no lesions no evidence of bleeding. Urethra and Bladder: Normal, non-tender  Cervix: Surgically absent  Uterus: Surgically absent   bi-manual examination: Non-tender; no adenxal masses or nodularity  Rectal: normal sphincter tone, no masses, no blood  Lower extremities: No edema or varicosities. Normal range of motion      Allergies  Allergen Reactions  . Atorvastatin Other (See Comments)    Leg pain  . Macrodantin Other (See Comments)    Unknown Reaction  . Decongest-Aid [Pseudoephedrine] Palpitations    Past Medical History  Diagnosis Date  . Rheumatoid arthritis(714.0) ~ 2010    dr Ouida Sills  . Endometrial polyp   . Elevated cholesterol   . Ovarian ca 07-2012       . DJD (degenerative joint disease)   . IRRITABLE BOWEL SYNDROME, HX OF 05/15/2008  . Anxiety and depression    . MVP (mitral valve prolapse)     occ palpitations   . Shingles     Past Surgical History  Procedure Laterality Date  . Hand surgery  1996  . Tubal ligation  1980  . Hysteroscopy  2004    D&C  . Dilation and curettage of uterus  2004    WITH HYSTEROSCOPY  . Neck surgery  2009    SPURS  . Cyst on neck  2011  . Abdominal hysterectomy  08/22/2012    Procedure: HYSTERECTOMY ABDOMINAL;  Surgeon: Alvino Chapel, MD;  Location: [facility];  Service: Gynecology;  Laterality: N/A;  . Salpingoophorectomy  08/22/2012    Procedure: SALPINGO OOPHORECTOMY;  Surgeon: Alvino Chapel, MD;   Location: [facility];  Service: Gynecology;  Laterality: Bilateral;  . Omentectomy  08/22/2012    Procedure: OMENTECTOMY;  Surgeon: Alvino Chapel, MD;  Location: [facility];  Service: Gynecology;  Laterality: N/A;  . Debulking  08/22/2012    Procedure: DEBULKING;  Surgeon: Alvino Chapel, MD;  Location: [facility];  Service: Gynecology;  Laterality: N/A;  Radical tumor debulking, Bilateral Ureterolysis  . Colostomy revision  08/22/2012    Procedure: COLON RESECTION SIGMOID;  Surgeon: Alvino Chapel, MD;  Location: [facility];  Service: Gynecology;;  Rectal Sigmoid resection and low rectal anastomosis  . Back surgery  2008    Dr Trenton Gammon  . Colonoscopy  04-20-2004  . Colonoscopy w/ biopsies  07/2014    Current Outpatient Prescriptions  Medication Sig Dispense Refill  . calcium-vitamin D (OSCAL WITH D) 500-200 MG-UNIT per tablet Take 1 tablet by mouth daily.      [name] docusate sodium (COLACE) 100 MG capsule Take 100 mg by mouth daily.    [name] escitalopram (LEXAPRO) 20 MG tablet Take 20 mg by mouth daily. Take one and 1/2 tablet in the morning    . fish oil-omega-3 fatty acids 1000 MG capsule  Take 1 g by mouth daily.      . Melatonin 3 MG TABS Take 1 tablet by mouth at bedtime.    . Multiple Vitamin (MULTIVITAMIN WITH MINERALS) TABS Take 1 tablet by mouth daily.    . pravastatin (PRAVACHOL) 40 MG tablet Take 1 tablet (40 mg total) by mouth daily. 90 tablet 3  . Flaxseed, Linseed, (FLAX SEED OIL) 1000 MG CAPS Take 1 capsule by mouth daily.     No current facility-administered medications for this visit.   Facility-Administered Medications Ordered in Other Visits  Medication Dose Route Frequency Provider Last Rate Last Dose  . sodium chloride 0.9 % injection 10 mL  10 mL Intracatheter PRN Ladell Pier, MD   10 mL at 01/16/13 1359    History   Social History  . Marital Status: Married    Spouse Name: N/A  . Number of Children: 2  . Years of Education: N/A   Occupational History   . disability d/t RA    Social History Main Topics  . Smoking status: Never Smoker   . Smokeless tobacco: Never Used  . Alcohol Use: No  . Drug Use: No  . Sexual Activity: No   Other Topics Concern  . Not on file   Social History Narrative   [name] is her daughter    Household-- pr and husband    Family History  Problem Relation Age of Onset  . Heart disease Mother   . Osteoporosis Sister   . Bladder Cancer Father 24  . Lymphoma Paternal Aunt   . Colon cancer Neg Hx   . Rectal cancer Neg Hx   . Stomach cancer Neg Hx       CLARKE-PEARSON,Crystalmarie Yasin L, MD 02/28/2015, 10:45 AM

## 2015-02-28 NOTE — Addendum Note (Signed)
Addended by: Christa See on: 02/28/2015 11:32 AM   Modules accepted: Orders

## 2015-02-28 NOTE — Patient Instructions (Signed)
Followup in 6 months as scheduled. Call us sooner with any questions or concerns.

## 2015-05-02 ENCOUNTER — Telehealth: Payer: Self-pay | Admitting: Oncology

## 2015-05-02 ENCOUNTER — Telehealth: Payer: Self-pay

## 2015-05-02 NOTE — Telephone Encounter (Signed)
Pt saw dr Aldean Ast recently. Dr Dianah Field did tumor marker ca 125 on 5/26.  Can she postpone [name] [name] until 3 months for next CA 125 lab which would be in August?

## 2015-05-02 NOTE — Telephone Encounter (Signed)
Pt called with questions about mondays appt

## 2015-05-02 NOTE — Telephone Encounter (Signed)
Pt called to r/s labs/ov due to should be 3 months sent to triage and they sent her back to me to r/s labs/ov for the end of Aug. Pt confirmed labs/ov .[name]... KJ

## 2015-05-05 ENCOUNTER — Ambulatory Visit: Payer: Medicare Other | Admitting: Oncology

## 2015-05-05 ENCOUNTER — Other Ambulatory Visit: Payer: Medicare Other

## 2015-05-23 ENCOUNTER — Telehealth: Payer: Self-pay | Admitting: *Deleted

## 2015-05-23 NOTE — Telephone Encounter (Signed)
Pt called to confirm appt date/time: 06/05/15 labs/Dr. Benay Spice at 10am. Pt appreciated confirmation.

## 2015-06-05 ENCOUNTER — Telehealth: Payer: Self-pay | Admitting: Oncology

## 2015-06-05 ENCOUNTER — Other Ambulatory Visit: Payer: Self-pay | Admitting: *Deleted

## 2015-06-05 ENCOUNTER — Other Ambulatory Visit: Payer: Medicare Other

## 2015-06-05 ENCOUNTER — Encounter: Payer: Self-pay | Admitting: Oncology

## 2015-06-05 ENCOUNTER — Ambulatory Visit (HOSPITAL_BASED_OUTPATIENT_CLINIC_OR_DEPARTMENT_OTHER): Payer: Medicare Other | Admitting: Oncology

## 2015-06-05 VITALS — BP 121/67 | HR 73 | Temp 98.1°F | Resp 18 | Ht 65.0 in | Wt 138.9 lb

## 2015-06-05 DIAGNOSIS — C569 Malignant neoplasm of unspecified ovary: Secondary | ICD-10-CM

## 2015-06-05 DIAGNOSIS — Z8543 Personal history of malignant neoplasm of ovary: Secondary | ICD-10-CM | POA: Diagnosis not present

## 2015-06-05 NOTE — Progress Notes (Signed)
  [facility] OFFICE PROGRESS NOTE   Diagnosis: Ovarian cancer  INTERVAL HISTORY:   [name] [name] returns as scheduled. She feels well. She occasionally has discomfort at the rectum during a movement including this week. She notes a "fullness "at the rectum that improves after she has a bowel movement. No bleeding. She had a colonoscopy in October 2015. A tubular adenoma was removed from the ascending colon.  Objective:  Vital signs in last 24 hours:  Blood pressure 121/67, pulse 73, temperature 98.1 F (36.7 C), temperature source Oral, resp. rate 18, height 5\' 5"  (1.651 m), weight 138 lb 14.4 oz (63.005 kg), SpO2 98 %.    HEENT: Neck without mass Lymphatics: No cervical, supra-clavicular, axillary, or inguinal nodes Resp: Lungs clear bilaterally Cardio: Regular rate and rhythm GI: No hepatomegaly, no mass, mild tenderness in the right lower abdomen, no apparent ascites Vascular: No leg edema Rectal: No hemorrhoids, no mass in the anal canal or rectum, firm 3 mm area of smooth nodular fullness overlying the left anterior bony pelvis--this appears to be outside of the rectal mucosa     Lab Results:   Medications: I have reviewed the patient's current medications.  Assessment/Plan: 1. 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 A 5 mm nodules remain on the right diaphragm. -  Cycle 1 of adjuvant Taxol/carboplatin chemotherapy initiated on 09/19/2012.   The CA 125 normalized.   She completed day 15 of cycle 6 on 02/06/2013.   Restaging CT evaluation 03/29/2013 showed no evidence of metastatic disease in the chest. There was marked improvement in appearance/resolution of previous described peritoneal/omental disease. There was no convincing evidence of residual disease. There was minimal increased density in the  region of the omentum favored to be treatment-related. There was no pelvic adenopathy.  CA125 3.2 on 02/19/2014. 2. Low abdomen/suprapubic pain prior to the exploratory laparotomy-likely secondary to omental/pelvic tumor; persistent mild pain in the lower abdomen  3. Chronic neck and back pain.  4. Anxiety -persistent despite Lexapro and Xanax. She has been evaluated by psychiatry. Currently on Lexapro. 5. Status post Port-A-Cath placement 09/22/2012. The Port-A-Cath was removed on 04/03/2013.  6. Neutropenia secondary to chemotherapy- day 15 cycle 1 and cycle 3. Taxol/carboplatin not given secondary to neutropenia.  7. Herpes zoster involving a right thoracic dermatome July 2015. She completed a course of Valtrex. 8. Nodular bony prominence at the left pelvis on rectal exam 06/05/2015-likely a benign finding    Disposition:  [name] [name] remains in clinic remission from ovarian cancer. She will see Dr. Fermin Schwab in November. We will follow-up on the CA 125 from today and she will return for an office visit in 6 months. She will seek medical attention for persistent rectal symptoms. I suspect the nodular area noted on exam today is a benign finding.  [name], MD  06/05/2015  10:49 AM

## 2015-06-05 NOTE — Progress Notes (Signed)
Checked in patient for front desk.Pt signed AOB. Pt states please bill her for $40 copay.

## 2015-06-05 NOTE — Telephone Encounter (Signed)
Gave and printd appt sched and avs for pt for NOV and March

## 2015-06-06 LAB — CA 125: CA 125: 5 U/mL (ref <35)

## 2015-06-16 ENCOUNTER — Telehealth: Payer: Self-pay | Admitting: *Deleted

## 2015-06-16 NOTE — Telephone Encounter (Signed)
-----   Message from Ladell Pier, MD sent at 06/06/2015  4:39 PM EDT ----- Please call patient ca125 is normal

## 2015-06-16 NOTE — Telephone Encounter (Signed)
Per Dr. Sherrill; notified pt that ca 125 is normal.  Pt verbalized understanding and expressed appreciation for call 

## 2015-06-16 NOTE — Telephone Encounter (Signed)
PT. HAS NOT HEARD FROM DR.SHERRILL'S OFFICE CONCERNING RESULTS.

## 2015-08-18 ENCOUNTER — Telehealth: Payer: Self-pay | Admitting: Internal Medicine

## 2015-08-18 NOTE — Telephone Encounter (Signed)
[name] [name] is out of town, this will have to wait until she returns

## 2015-08-18 NOTE — Telephone Encounter (Signed)
Pt called - she wants to transfer from Dr Larose Kells, due to the fact that she lives in [place] and wants a place closer to home.  Please let   Me know if this is ok with both of you  Thank you

## 2015-08-18 NOTE — Telephone Encounter (Signed)
Okay with me 

## 2015-08-20 ENCOUNTER — Ambulatory Visit: Payer: Medicare Other | Admitting: Internal Medicine

## 2015-08-20 ENCOUNTER — Other Ambulatory Visit (HOSPITAL_BASED_OUTPATIENT_CLINIC_OR_DEPARTMENT_OTHER): Payer: Medicare Other

## 2015-08-20 DIAGNOSIS — C569 Malignant neoplasm of unspecified ovary: Secondary | ICD-10-CM

## 2015-08-21 ENCOUNTER — Other Ambulatory Visit: Payer: Medicare Other

## 2015-08-21 LAB — CA 125: CA 125: 7 U/mL (ref <35)

## 2015-08-22 ENCOUNTER — Ambulatory Visit: Payer: Medicare Other | Attending: Gynecology | Admitting: Gynecology

## 2015-08-22 ENCOUNTER — Encounter: Payer: Self-pay | Admitting: Gynecology

## 2015-08-22 VITALS — BP 101/72 | HR 82 | Temp 98.4°F | Resp 18 | Ht 65.0 in | Wt 142.5 lb

## 2015-08-22 DIAGNOSIS — Z8543 Personal history of malignant neoplasm of ovary: Secondary | ICD-10-CM

## 2015-08-22 DIAGNOSIS — K5900 Constipation, unspecified: Secondary | ICD-10-CM | POA: Diagnosis not present

## 2015-08-22 DIAGNOSIS — C569 Malignant neoplasm of unspecified ovary: Secondary | ICD-10-CM | POA: Diagnosis not present

## 2015-08-22 NOTE — Patient Instructions (Signed)
Plan to see Dr. Fermin Schwab in 6 months with a CA 125 before.  We will cancel your appt with Dr. Gearldine Shown NP.  Please call for any questions or concerns.

## 2015-08-22 NOTE — Progress Notes (Signed)
Consult Note: Gyn-Onc   [name] [name] 64 y.o. female  Chief Complaint  Patient presents with  . Ovarian Cancer    Follow up    Assessment: stage IIIc ovarian cancer (optimally debulked). Status post 6 cycles of carboplatin and Taxol given in a dose dense regimencompleted May 2014.[name] [name] She is clinically free of disease.  Plan  We will plan on seeing the patient in 6 months and obtain a CA 125 just prior to that visit. The patient continues to [facility] for constipation.   She will continue to have annual mammograms.  Interval history: . Since her last visit she's done well. She had a recent CA 125 value of 7 units per mL   . She has no GI or GU symptoms.  . She has no pelvic pain pressure vaginal discharge.    She had mammograms recently which were normal. Functional status is excellent.   [id number] is a 64 year old gravida 2 para 2 who went through menopause at the age about 64 years of age. She never took any hormone replacement therapy. She states that for the past 6 months she's experienced some pelvic soreness in pressure. She has noted some increasing urinary frequency. With these symptoms she had a PET scan on October 9. It revealed significant soft tissue thickening seen throughout the greater omentum suspicious for intraperitoneal carcinoma. There is minimal ascites in the pelvis. it revealed the areas of omental and peritoneal nodularity were hypermetabolic. There is a dominant pelvic omental mass measuring 10.4 x 3.3 cm. There is a small omental implants in the left side of the abdomen measuring 1.1 cm. There were multiple pelvic foci That wereperitoneal based And hypermetabolic. These included the cul-de-sac. She has small volume of ascites. She underwent a CT-guided biopsy on July 24, 2012. It revealed metastatic carcinoma. By immunohistochemistry, malignant cells were positive for WT 1, cytokeratin 7, estrogen receptor. There were negative for TTF-1, progesterone receptor,  cytokeratin 20, CEA, CD X2, and S100. Overall, the histologic features were highly suggestive of a primary gynecologic origin. Her CA 125 on October 10 was elevated at 69.  She underwent exploratory laparotomy on 08/22/2012 findings of stage IIIC ovarian cancer. Patient was optimally debulked. Debulking, however, required a rectosigmoid resection as well as total omentectomy total nominal hysterectomy bilateral salpingo-oophorectomy. She was optimally debulked with only 5 mm nodules on the right diaphragm as residual disease.  SURGICAL FINDINGS: At exploratory laparotomy the infracolic omentum was nearly replaced with tumor. This measured approximately 12 x 16 cm. There were 5 mm nodules on the right diaphragm. The small bowel serosa and mesentery were normal. In the pelvis it appeared that the left ovary was a primary source. It was cystic and densely adherent to the sigmoid mesentery. The right ovary was approximately 4 cm in diameter and had multiple excrescences on it. There were extensive tumor implants on the bladder flap and involving the sigmoid mesentery and serosa, and posterior cul-de-sac. At the conclusion of the surgical procedure the only residual disease were 5 mm nodules on the right diaphragm. Chemotherapy was initiated in December 2013 using the dose dense regimen of carboplatin and Taxol   She received 6 cycles of carboplatin and Taxol the last administered on 02/06/2013. At that time her CA 125 is 5.6 units per mL.  Review of Systems:10 point review of systems is negative as noted above.   Vitals: Blood pressure 101/72, pulse 82, temperature 98.4 F (36.9 C), temperature source Oral, resp. rate 18, height 5'  5" (1.651 m), weight 142 lb 8 oz (64.638 kg), SpO2 99 %.  Physical Exam: General : The patient is a healthy woman in no acute distress.  HEENT: normocephalic, extraoccular movements normal; neck is supple without thyromegally  Lynphnodes: Supraclavicular and inguinal nodes not  enlarged Breasts are without masses, skin changes, or nipple discharge. Axillary nodes are not enlarged.  Abdomen: Soft, non-tender, no ascites, no organomegally, no masses, no hernias  Pelvic:  EGBUS: Normal female  Vagina: Normal, no lesions no evidence of bleeding. Urethra and Bladder: Normal, non-tender  Cervix: Surgically absent  Uterus: Surgically absent   bi-manual examination: Non-tender; no adenxal masses or nodularity  Rectal: normal sphincter tone, no masses, no blood  Lower extremities: No edema or varicosities. Normal range of motion      Allergies  Allergen Reactions  . Atorvastatin Other (See Comments)    Leg pain  . Macrodantin Other (See Comments)    Unknown Reaction  . Decongest-Aid [Pseudoephedrine] Palpitations    Past Medical History  Diagnosis Date  . Rheumatoid arthritis(714.0) ~ 2010    dr Ouida Sills  . Endometrial polyp   . Elevated cholesterol   . Ovarian ca (Kevin) 07-2012       . DJD (degenerative joint disease)   . IRRITABLE BOWEL SYNDROME, HX OF 05/15/2008  . Anxiety and depression    . MVP (mitral valve prolapse)     occ palpitations   . Shingles     Past Surgical History  Procedure Laterality Date  . Hand surgery  1996  . Tubal ligation  1980  . Hysteroscopy  2004    D&C  . Dilation and curettage of uterus  2004    WITH HYSTEROSCOPY  . Neck surgery  2009    SPURS  . Cyst on neck  2011  . Abdominal hysterectomy  08/22/2012    Procedure: HYSTERECTOMY ABDOMINAL;  Surgeon: Alvino Chapel, MD;  Location: [facility];  Service: Gynecology;  Laterality: N/A;  . Salpingoophorectomy  08/22/2012    Procedure: SALPINGO OOPHORECTOMY;  Surgeon: Alvino Chapel, MD;  Location: [facility];  Service: Gynecology;  Laterality: Bilateral;  . Omentectomy  08/22/2012    Procedure: OMENTECTOMY;  Surgeon: Alvino Chapel, MD;  Location: [facility];  Service: Gynecology;  Laterality: N/A;  . Debulking  08/22/2012    Procedure: DEBULKING;   Surgeon: Alvino Chapel, MD;  Location: [facility];  Service: Gynecology;  Laterality: N/A;  Radical tumor debulking, Bilateral Ureterolysis  . Colostomy revision  08/22/2012    Procedure: COLON RESECTION SIGMOID;  Surgeon: Alvino Chapel, MD;  Location: [facility];  Service: Gynecology;;  Rectal Sigmoid resection and low rectal anastomosis  . Back surgery  2008    Dr Trenton Gammon  . Colonoscopy  04-20-2004  . Colonoscopy w/ biopsies  07/2014    Current Outpatient Prescriptions  Medication Sig Dispense Refill  . calcium-vitamin D (OSCAL WITH D) 500-200 MG-UNIT per tablet Take 1 tablet by mouth daily.      [name] [name] docusate sodium (COLACE) 100 MG capsule Take 100 mg by mouth daily as needed.     [name] [name] escitalopram (LEXAPRO) 20 MG tablet Take 20 mg by mouth daily. Take one and 1/2 tablet in the morning    . fish oil-omega-3 fatty acids 1000 MG capsule Take 1 g by mouth daily.      . Flaxseed, Linseed, (FLAX SEED OIL) 1000 MG CAPS Take 1 capsule by mouth daily.    . Melatonin 3 MG TABS Take 1  tablet by mouth at bedtime.    . Multiple Vitamin (MULTIVITAMIN WITH MINERALS) TABS Take 1 tablet by mouth daily.    . pravastatin (PRAVACHOL) 40 MG tablet Take 1 tablet (40 mg total) by mouth daily. 90 tablet 3   No current facility-administered medications for this visit.   Facility-Administered Medications Ordered in Other Visits  Medication Dose Route Frequency Provider Last Rate Last Dose  . sodium chloride 0.9 % injection 10 mL  10 mL Intracatheter PRN Ladell Pier, MD   10 mL at 01/16/13 1359    Social History   Social History  . Marital Status: Married    Spouse Name: N/A  . Number of Children: 2  . Years of Education: N/A   Occupational History  . disability d/t RA    Social History Main Topics  . Smoking status: Never Smoker   . Smokeless tobacco: Never Used  . Alcohol Use: No  . Drug Use: No  . Sexual Activity: No   Other Topics Concern  . Not on file   Social History Narrative    [name] is her daughter    Household-- pr and husband    Family History  Problem Relation Age of Onset  . Heart disease Mother   . Osteoporosis Sister   . Bladder Cancer Father 22  . Lymphoma Paternal Aunt   . Colon cancer Neg Hx   . Rectal cancer Neg Hx   . Stomach cancer Neg Hx       CLARKE-PEARSON,Nollan Muldrow L, MD 08/22/2015, 12:54 PM

## 2015-08-24 ENCOUNTER — Telehealth: Payer: Self-pay | Admitting: Primary Care

## 2015-08-24 NOTE — Telephone Encounter (Signed)
I'm happy to see her, thanks!

## 2015-08-25 NOTE — Telephone Encounter (Signed)
Fine with me

## 2015-08-25 NOTE — Telephone Encounter (Signed)
Patient called back and said her sister, Evonnie Pat, see Rollene Fare and she'd like to switch to [place].  Can patient switch to [place]?

## 2015-08-26 ENCOUNTER — Encounter: Payer: Self-pay | Admitting: Internal Medicine

## 2015-08-26 ENCOUNTER — Ambulatory Visit (INDEPENDENT_AMBULATORY_CARE_PROVIDER_SITE_OTHER): Payer: Medicare Other | Admitting: Internal Medicine

## 2015-08-26 VITALS — BP 122/64 | HR 88 | Temp 97.6°F | Ht 65.0 in | Wt 143.0 lb

## 2015-08-26 DIAGNOSIS — Z23 Encounter for immunization: Secondary | ICD-10-CM | POA: Diagnosis not present

## 2015-08-26 DIAGNOSIS — E559 Vitamin D deficiency, unspecified: Secondary | ICD-10-CM | POA: Diagnosis not present

## 2015-08-26 DIAGNOSIS — R351 Nocturia: Secondary | ICD-10-CM | POA: Diagnosis not present

## 2015-08-26 DIAGNOSIS — Z09 Encounter for follow-up examination after completed treatment for conditions other than malignant neoplasm: Secondary | ICD-10-CM | POA: Insufficient documentation

## 2015-08-26 DIAGNOSIS — E785 Hyperlipidemia, unspecified: Secondary | ICD-10-CM

## 2015-08-26 DIAGNOSIS — Z1159 Encounter for screening for other viral diseases: Secondary | ICD-10-CM

## 2015-08-26 DIAGNOSIS — F418 Other specified anxiety disorders: Secondary | ICD-10-CM | POA: Diagnosis not present

## 2015-08-26 DIAGNOSIS — F419 Anxiety disorder, unspecified: Secondary | ICD-10-CM

## 2015-08-26 DIAGNOSIS — F329 Major depressive disorder, single episode, unspecified: Secondary | ICD-10-CM

## 2015-08-26 DIAGNOSIS — N390 Urinary tract infection, site not specified: Secondary | ICD-10-CM

## 2015-08-26 DIAGNOSIS — F32A Depression, unspecified: Secondary | ICD-10-CM

## 2015-08-26 DIAGNOSIS — Z114 Encounter for screening for human immunodeficiency virus [HIV]: Secondary | ICD-10-CM

## 2015-08-26 LAB — ALT: ALT: 26 U/L (ref 0–35)

## 2015-08-26 LAB — POCT URINALYSIS DIPSTICK
Bilirubin, UA: NEGATIVE
Blood, UA: NEGATIVE
Glucose, UA: NEGATIVE
KETONES UA: NEGATIVE
Nitrite, UA: NEGATIVE
PH UA: 6.5
PROTEIN UA: NEGATIVE
Spec Grav, UA: 1.02
UROBILINOGEN UA: 0.2

## 2015-08-26 LAB — LIPID PANEL
CHOL/HDL RATIO: 4
CHOLESTEROL: 209 mg/dL — AB (ref 0–200)
HDL: 52.2 mg/dL (ref >39.00)
LDL CALC: 126 mg/dL — AB (ref 0–99)
NonHDL: 156.85
Triglycerides: 152 mg/dL — ABNORMAL HIGH (ref 0.0–149.0)
VLDL: 30.4 mg/dL (ref 0.0–40.0)

## 2015-08-26 LAB — URINALYSIS, ROUTINE W REFLEX MICROSCOPIC
BILIRUBIN URINE: NEGATIVE
Hgb urine dipstick: NEGATIVE
KETONES UR: NEGATIVE
Leukocytes, UA: NEGATIVE
Nitrite: NEGATIVE
PH: 7 (ref 5.0–8.0)
RBC / HPF: NONE SEEN (ref 0–?)
Specific Gravity, Urine: 1.02 (ref 1.000–1.030)
TOTAL PROTEIN, URINE-UPE24: NEGATIVE
UROBILINOGEN UA: 0.2 (ref 0.0–1.0)
Urine Glucose: NEGATIVE
WBC UA: NONE SEEN (ref 0–?)

## 2015-08-26 LAB — AST: AST: 21 U/L (ref 0–37)

## 2015-08-26 NOTE — Patient Instructions (Addendum)
Get your blood work before you leave      Next visit  for a    complete physical exam in 3-4 months. No fasting   Please schedule an appointment at the front desk

## 2015-08-26 NOTE — Progress Notes (Signed)
Subjective:    Patient ID: [name] [name], female    DOB: [birth year], 64 y.o.   MRN: [id number]  DOS:  08/26/2015 Type of visit - description : Routine office visit Interval history: High cholesterol, on medications, likes blood test done Frequent UTIs recently, occasionally has lower abdominal discomfort, saw her cancer surgeon last week, they felt it was probably related to adhesions. Anxiety depression: Relatively well control, follow up elsewhere Vitamin D deficiency: Not on supplements, likes levels to be checked.  Review of Systems  Denies fever chills No nausea, vomiting, diarrhea. Occasionally feels bloated. No dysuria, gross hematuria or difficulty urinating Past Medical History  Diagnosis Date  . Rheumatoid arthritis(714.0) ~ 2010    dr Ouida Sills  . Endometrial polyp   . Elevated cholesterol   . Ovarian ca ([place]) 07-2012       . DJD (degenerative joint disease)   . IRRITABLE BOWEL SYNDROME, HX OF 05/15/2008  . Anxiety and depression    . MVP (mitral valve prolapse)     occ palpitations   . Shingles     Past Surgical History  Procedure Laterality Date  . Hand surgery  1996  . Tubal ligation  1980  . Hysteroscopy  2004    D&C  . Dilation and curettage of uterus  2004    WITH HYSTEROSCOPY  . Neck surgery  2009    SPURS  . Cyst on neck  2011  . Abdominal hysterectomy  08/22/2012    Procedure: HYSTERECTOMY ABDOMINAL;  Surgeon: Alvino Chapel, MD;  Location: [facility];  Service: Gynecology;  Laterality: N/A;  . Salpingoophorectomy  08/22/2012    Procedure: SALPINGO OOPHORECTOMY;  Surgeon: Alvino Chapel, MD;  Location: [facility];  Service: Gynecology;  Laterality: Bilateral;  . Omentectomy  08/22/2012    Procedure: OMENTECTOMY;  Surgeon: Alvino Chapel, MD;  Location: [facility];  Service: Gynecology;  Laterality: N/A;  . Debulking  08/22/2012    Procedure: DEBULKING;  Surgeon: Alvino Chapel, MD;  Location: [facility];  Service: Gynecology;   Laterality: N/A;  Radical tumor debulking, Bilateral Ureterolysis  . Colostomy revision  08/22/2012    Procedure: COLON RESECTION SIGMOID;  Surgeon: Alvino Chapel, MD;  Location: [facility];  Service: Gynecology;;  Rectal Sigmoid resection and low rectal anastomosis  . Back surgery  2008    Dr Trenton Gammon  . Colonoscopy  04-20-2004  . Colonoscopy w/ biopsies  07/2014    Social History   Social History  . Marital Status: Married    Spouse Name: N/A  . Number of Children: 2  . Years of Education: N/A   Occupational History  . disability d/t RA    Social History Main Topics  . Smoking status: Never Smoker   . Smokeless tobacco: Never Used  . Alcohol Use: No  . Drug Use: No  . Sexual Activity: No   Other Topics Concern  . Not on file   Social History Narrative   [name] is her daughter    Household-- pr and husband        Medication List       This list is accurate as of: 08/26/15  8:02 PM.  Always use your most recent med list.               docusate sodium 100 MG capsule  Commonly known as:  COLACE  Take 100 mg by mouth daily as needed.     escitalopram 20 MG tablet  Commonly known as:  LEXAPRO  Take 20 mg by mouth daily. Take one and 1/2 tablet in the morning     fish oil-omega-3 fatty acids 1000 MG capsule  Take 1 g by mouth daily.     Flax Seed Oil 1000 MG Caps  Take 1 capsule by mouth daily.     Melatonin 3 MG Tabs  Take 1 tablet by mouth at bedtime.     multivitamin with minerals Tabs tablet  Take 1 tablet by mouth daily.     pravastatin 40 MG tablet  Commonly known as:  PRAVACHOL  Take 1 tablet (40 mg total) by mouth daily.           Objective:   Physical Exam BP 122/64 mmHg  Pulse 88  Temp(Src) 97.6 F (36.4 C) (Oral)  Ht 5\' 5"  (1.651 m)  Wt 143 lb (64.864 kg)  BMI 23.80 kg/m2  SpO2 99% General:   Well developed, well nourished . NAD.  HEENT:  Normocephalic . Face symmetric, atraumatic Lungs:  CTA B Normal  respiratory effort, no intercostal retractions, no accessory muscle use. Heart: RRR,  no murmur.  no pretibial edema bilaterally  Abdomen:  Not distended, soft, non-tender. No rebound or rigidity. No CVA tenderness Skin: Not pale. Not jaundice Neurologic:  alert & oriented X3.  Speech normal, gait appropriate for age and unassisted Psych--  Cognition and judgment appear intact.  Cooperative with normal attention span and concentration.  Behavior appropriate. No anxious or depressed appearing.    Assessment & Plan:   Assessment> Anxiety depression -- f/u elsewhere Hyperlipidemia MSK: ---Rheumatoid arthritis 2010, saw Dr Ouida Sills, on no meds as of 08-2015  ---DJD MVP with occasional palpitations OVARIAN CA -- Dr Dianah Field, surgery 2013, last visit 08-2015, told ok Vitamin D deficiency  Freq UITs  H/o IBS H/o Shingles  PLAN Hyperlipidemia: Continue Pravachol, check FLP, AST, ALT Anxiety depression, relatively well controlled, follow-up elsewhere Frequent UTIs recently--- has no urinary symptoms but sometimes lower abdominal pain felt to be from adhesions. Udip with trace leukocytes, will check a UA and urine culture. Vitamin D deficiency: Recommend to go back on supplements daily, check labs. Primary care: Check a hep C and HIV RTC 3-4 months for a physical, no fasting

## 2015-08-26 NOTE — Assessment & Plan Note (Signed)
Hyperlipidemia: Continue Pravachol, check FLP, AST, ALT Anxiety depression, relatively well controlled, follow-up elsewhere Frequent UTIs recently--- has no urinary symptoms but sometimes lower abdominal pain felt to be from adhesions. Udip with trace leukocytes, will check a UA and urine culture. Vitamin D deficiency: Recommend to go back on supplements daily, check labs. Primary care: Check a hep C and HIV RTC 3-4 months for a physical, no fasting

## 2015-08-26 NOTE — Progress Notes (Signed)
Pre visit review using our clinic review tool, if applicable. No additional management support is needed unless otherwise documented below in the visit note. 

## 2015-08-27 LAB — HIV ANTIBODY (ROUTINE TESTING W REFLEX): HIV 1&2 Ab, 4th Generation: NONREACTIVE

## 2015-08-27 LAB — HEPATITIS C ANTIBODY: HCV Ab: NEGATIVE

## 2015-08-28 LAB — URINE CULTURE
COLONY COUNT: NO GROWTH
Organism ID, Bacteria: NO GROWTH

## 2015-08-29 LAB — VITAMIN D 1,25 DIHYDROXY
VITAMIN D3 1, 25 (OH): 89 pg/mL
Vitamin D 1, 25 (OH)2 Total: 89 pg/mL — ABNORMAL HIGH (ref 18–72)
Vitamin D2 1, 25 (OH)2: <8 pg/mL

## 2015-09-01 ENCOUNTER — Telehealth: Payer: Self-pay | Admitting: Internal Medicine

## 2015-09-01 NOTE — Telephone Encounter (Signed)
No urinary infection, cholesterol continued to be well-controlled although it used to be a little better. Watch diet. Vitamin D is a little high, okay to cut the supplements she takes in half. Other labs are very good.

## 2015-09-01 NOTE — Telephone Encounter (Signed)
Pt calling about results from 08/26/2015. Please advise.

## 2015-09-01 NOTE — Telephone Encounter (Signed)
Pt would like call when results have been reviewed.

## 2015-09-01 NOTE — Telephone Encounter (Signed)
Tried calling Pt, not currently home. Left message that I would call back tomorrow morning.

## 2015-09-01 NOTE — Telephone Encounter (Signed)
Pt called about $40 copay she paid on 08/26/15. She said she is not supposed to have a PCP copay and wasn't thinking about it. She is wanting [place] to refund the $40.

## 2015-09-01 NOTE — Telephone Encounter (Signed)
Pt scheduled for thurs 12/1/ Pt aware

## 2015-09-02 NOTE — Telephone Encounter (Signed)
Notified pt and she voices understanding. 

## 2015-09-04 ENCOUNTER — Ambulatory Visit (INDEPENDENT_AMBULATORY_CARE_PROVIDER_SITE_OTHER): Payer: Medicare Other | Admitting: Internal Medicine

## 2015-09-04 ENCOUNTER — Encounter: Payer: Self-pay | Admitting: Internal Medicine

## 2015-09-04 VITALS — BP 110/68 | HR 85 | Temp 97.9°F | Ht 64.0 in | Wt 142.0 lb

## 2015-09-04 DIAGNOSIS — C569 Malignant neoplasm of unspecified ovary: Secondary | ICD-10-CM

## 2015-09-04 DIAGNOSIS — M069 Rheumatoid arthritis, unspecified: Secondary | ICD-10-CM | POA: Diagnosis not present

## 2015-09-04 DIAGNOSIS — F329 Major depressive disorder, single episode, unspecified: Secondary | ICD-10-CM

## 2015-09-04 DIAGNOSIS — F418 Other specified anxiety disorders: Secondary | ICD-10-CM | POA: Diagnosis not present

## 2015-09-04 DIAGNOSIS — F419 Anxiety disorder, unspecified: Secondary | ICD-10-CM

## 2015-09-04 DIAGNOSIS — F32A Depression, unspecified: Secondary | ICD-10-CM

## 2015-09-04 DIAGNOSIS — E7800 Pure hypercholesterolemia, unspecified: Secondary | ICD-10-CM | POA: Diagnosis not present

## 2015-09-04 NOTE — Progress Notes (Signed)
Pre visit review using our clinic review tool, if applicable. No additional management support is needed unless otherwise documented below in the visit note. 

## 2015-09-07 NOTE — Assessment & Plan Note (Signed)
-  Continue Pravachol ?

## 2015-09-07 NOTE — Progress Notes (Signed)
Subjective:    Patient ID: [name] [name], female    DOB: 1950-12-29, 64 y.o.   MRN: [id number]  HPI  Pt presents to the clinic today to establish care and for management of the conditions listed below. She is transferring care from Dr. Larose Kells.  Ovarian Cancer: She follows with Dr. Dianah Field. She had surgery in 2013.  Rheumatoid Arthritis: She follows with Dr. Ouida Sills. She is not on any medications for RA at this time.  HLD: Last LDL was 126, on Pravastatin. She denies myalgias. She does try to consume a low fat diet.  Depression: Symptoms well controlled on Lexapro. She denies SI/HI.  Flu: 08/2015 Tetanus: 10/2014 Pneumovax: 10/2014 Prevnar: never Zostovax: never Pap Smear: 11/2014 Mammogram: 10/2014 Bone Density: 2010 Colon Screening: 07/2014 Vision screening: yearly Dentist: biannually  Review of Systems      Past Medical History  Diagnosis Date  . Rheumatoid arthritis(714.0) ~ 2010    dr Ouida Sills  . Endometrial polyp   . Elevated cholesterol   . Ovarian ca ([place]) 07-2012       . DJD (degenerative joint disease)   . IRRITABLE BOWEL SYNDROME, HX OF 05/15/2008  . Anxiety and depression    . MVP (mitral valve prolapse)     occ palpitations   . Shingles     Current Outpatient Prescriptions  Medication Sig Dispense Refill  . docusate sodium (COLACE) 100 MG capsule Take 100 mg by mouth daily as needed.     [name] escitalopram (LEXAPRO) 20 MG tablet Take 20 mg by mouth daily. Take one and 1/2 tablet in the morning    . fish oil-omega-3 fatty acids 1000 MG capsule Take 1 g by mouth daily.      . Flaxseed, Linseed, (FLAX SEED OIL) 1000 MG CAPS Take 1 capsule by mouth daily.    . Melatonin 3 MG TABS Take 1 tablet by mouth at bedtime.    . Multiple Vitamin (MULTIVITAMIN WITH MINERALS) TABS Take 1 tablet by mouth daily.    . pravastatin (PRAVACHOL) 40 MG tablet Take 1 tablet (40 mg total) by mouth daily. 90 tablet 3   No current facility-administered medications for this visit.    Facility-Administered Medications Ordered in Other Visits  Medication Dose Route Frequency Provider Last Rate Last Dose  . sodium chloride 0.9 % injection 10 mL  10 mL Intracatheter PRN Ladell Pier, MD   10 mL at 01/16/13 1359    Allergies  Allergen Reactions  . Atorvastatin Other (See Comments)    Leg pain  . Macrodantin Other (See Comments)    Unknown Reaction  . Decongest-Aid [Pseudoephedrine] Palpitations    Family History  Problem Relation Age of Onset  . Heart disease Mother   . Osteoporosis Sister   . Bladder Cancer Father 24  . Lymphoma Paternal Aunt   . Colon cancer Neg Hx   . Rectal cancer Neg Hx   . Stomach cancer Neg Hx   . Diabetes Neg Hx   . Stroke Neg Hx     Social History   Social History  . Marital Status: Married    Spouse Name: N/A  . Number of Children: 2  . Years of Education: N/A   Occupational History  . disability d/t RA    Social History Main Topics  . Smoking status: Never Smoker   . Smokeless tobacco: Never Used  . Alcohol Use: No  . Drug Use: No  . Sexual Activity: No   Other Topics Concern  .  Not on file   Social History Narrative   [name] is her daughter    Household-- pr and husband     Constitutional: Denies fever, malaise, fatigue, headache or abrupt weight changes.  HEENT: Denies eye pain, eye redness, ear pain, ringing in the ears, wax buildup, runny nose, nasal congestion, bloody nose, or sore throat. Respiratory: Denies difficulty breathing, shortness of breath, cough or sputum production.   Cardiovascular: Denies chest pain, chest tightness, palpitations or swelling in the hands or feet.  Gastrointestinal: Denies abdominal pain, bloating, constipation, diarrhea or blood in the stool.  GU: Denies urgency, frequency, pain with urination, burning sensation, blood in urine, odor or discharge. Musculoskeletal: Pt reports joint stiffness in her hands. Denies decrease in range of motion, difficulty with  gait, muscle pain or joint swelling.  Skin: Denies redness, rashes, lesions or ulcercations.  Neurological: Denies dizziness, difficulty with memory, difficulty with speech or problems with balance and coordination.  Psych: Pt reports history of depression. Denies anxiety, SI/HI.  No other specific complaints in a complete review of systems (except as listed in HPI above).  Objective:   Physical Exam  BP 110/68 mmHg  Pulse 85  Temp(Src) 97.9 F (36.6 C) (Oral)  Ht 5\' 4"  (1.626 m)  Wt 142 lb (64.411 kg)  BMI 24.36 kg/m2  SpO2 98% Wt Readings from Last 3 Encounters:  09/04/15 142 lb (64.411 kg)  08/26/15 143 lb (64.864 kg)  08/22/15 142 lb 8 oz (64.638 kg)    General: Appears her stated age, well developed, well nourished in NAD. Cardiovascular: Normal rate and rhythm. S1,S2 noted.  No murmur, rubs or gallops noted.  Pulmonary/Chest: Normal effort and positive vesicular breath sounds. No respiratory distress. No wheezes, rales or ronchi noted.  Musculoskeletal: Enlarged joints noted in the hands bilaterally, Neurological: Alert and oriented.  Psychiatric: Mood and affect normal. Behavior is normal. Judgment and thought content normal.     BMET    Component Value Date/Time   NA 140 10/18/2014 1024   NA 139 01/02/2013 0821   K 3.8 10/18/2014 1024   K 4.0 01/02/2013 0821   CL 104 10/18/2014 1024   CL 104 01/02/2013 0821   CO2 29 10/18/2014 1024   CO2 26 01/02/2013 0821   GLUCOSE 90 10/18/2014 1024   GLUCOSE 86 01/02/2013 0821   BUN 22 10/18/2014 1024   BUN 21.3 01/02/2013 0821   CREATININE 0.69 10/18/2014 1024   CREATININE 0.7 01/02/2013 0821   CALCIUM 9.6 10/18/2014 1024   CALCIUM 9.4 01/02/2013 0821   GFRNONAA >90 08/23/2012 0340   GFRAA >90 08/23/2012 0340    Lipid Panel     Component Value Date/Time   CHOL 209* 08/26/2015 1138   TRIG 152.0* 08/26/2015 1138   HDL 52.20 08/26/2015 1138   CHOLHDL 4 08/26/2015 1138   VLDL 30.4 08/26/2015 1138   LDLCALC 126*  08/26/2015 1138    CBC    Component Value Date/Time   WBC 6.2 10/18/2014 1024   WBC 5.5 03/26/2013 0842   RBC 4.60 10/18/2014 1024   RBC 3.36* 03/26/2013 0842   HGB 14.8 10/18/2014 1024   HGB 12.8 03/26/2013 0842   HCT 45.6 10/18/2014 1024   HCT 37.4 03/26/2013 0842   PLT 245.0 10/18/2014 1024   PLT 194 03/26/2013 0842   MCV 99.2 10/18/2014 1024   MCV 111.1* 03/26/2013 0842   MCH 36.6* 04/03/2013 1441   MCH 38.2* 03/26/2013 0842   MCHC 32.5 10/18/2014 1024   MCHC  34.3 03/26/2013 0842   RDW 13.3 10/18/2014 1024   RDW 14.5 03/26/2013 0842   LYMPHSABS 1.5 10/18/2014 1024   LYMPHSABS 1.0 03/26/2013 0842   MONOABS 0.6 10/18/2014 1024   MONOABS 0.5 03/26/2013 0842   EOSABS 0.1 10/18/2014 1024   EOSABS 0.1 03/26/2013 0842   BASOSABS 0.0 10/18/2014 1024   BASOSABS 0.0 03/26/2013 0842    Hgb A1C No results found for: HGBA1C       Assessment & Plan:

## 2015-09-07 NOTE — Assessment & Plan Note (Signed)
She will continue to follow with Dr. Ouida Sills No meds for now

## 2015-09-07 NOTE — Assessment & Plan Note (Signed)
Controlled on Lexapro 

## 2015-09-07 NOTE — Patient Instructions (Signed)

## 2015-09-07 NOTE — Assessment & Plan Note (Signed)
She will continue to follow with Dr. Dianah Field

## 2015-12-03 ENCOUNTER — Other Ambulatory Visit: Payer: Medicare Other

## 2015-12-04 ENCOUNTER — Ambulatory Visit: Payer: Medicare Other | Admitting: Nurse Practitioner

## 2015-12-11 ENCOUNTER — Other Ambulatory Visit: Payer: Self-pay

## 2015-12-11 DIAGNOSIS — Z1231 Encounter for screening mammogram for malignant neoplasm of breast: Secondary | ICD-10-CM

## 2015-12-17 DIAGNOSIS — F411 Generalized anxiety disorder: Secondary | ICD-10-CM | POA: Diagnosis not present

## 2015-12-30 ENCOUNTER — Ambulatory Visit
Admission: RE | Admit: 2015-12-30 | Discharge: 2015-12-30 | Disposition: A | Discharge status: Home / Self Care | Payer: Medicare Other | Source: Ambulatory Visit

## 2015-12-30 DIAGNOSIS — Z1231 Encounter for screening mammogram for malignant neoplasm of breast: Secondary | ICD-10-CM

## 2016-02-11 ENCOUNTER — Other Ambulatory Visit: Payer: Medicare Other

## 2016-02-11 DIAGNOSIS — C569 Malignant neoplasm of unspecified ovary: Secondary | ICD-10-CM | POA: Diagnosis not present

## 2016-02-12 ENCOUNTER — Telehealth: Payer: Self-pay

## 2016-02-12 ENCOUNTER — Telehealth: Payer: Self-pay | Admitting: *Deleted

## 2016-02-12 LAB — CA 125: CANCER ANTIGEN (CA) 125: 8.8 U/mL (ref 0.0–38.1)

## 2016-02-12 LAB — CANCER ANTIGEN 125 (PARALLEL TESTING): CA 125: 9 U/mL (ref <35)

## 2016-02-12 NOTE — Telephone Encounter (Signed)
-----   Message from Ladell Pier, MD sent at 02/12/2016  1:28 PM EDT ----- Please call patient, ca125 is normal

## 2016-02-12 NOTE — Telephone Encounter (Signed)
Per Dr. Benay Spice, pt notified that CA125 is normal.  Pt is appreciative of call and has no questions or concerns at this time.

## 2016-02-12 NOTE — Telephone Encounter (Signed)
Orders received from [facility] , APNP to update with CA 125 level being normal at 8.8 . Patient contacted and updated , patient states understanding , denies further questions at this time .

## 2016-02-13 ENCOUNTER — Ambulatory Visit: Payer: Medicare Other | Admitting: Gynecology

## 2016-02-20 ENCOUNTER — Ambulatory Visit: Payer: Medicare Other | Attending: Gynecology | Admitting: Gynecology

## 2016-02-20 ENCOUNTER — Telehealth: Payer: Self-pay | Admitting: *Deleted

## 2016-02-20 ENCOUNTER — Other Ambulatory Visit (HOSPITAL_BASED_OUTPATIENT_CLINIC_OR_DEPARTMENT_OTHER): Payer: Medicare Other

## 2016-02-20 ENCOUNTER — Encounter: Payer: Self-pay | Admitting: Gynecology

## 2016-02-20 VITALS — BP 112/80 | HR 72 | Temp 98.4°F | Resp 18 | Ht 65.0 in | Wt 147.7 lb

## 2016-02-20 DIAGNOSIS — D490 Neoplasm of unspecified behavior of digestive system: Secondary | ICD-10-CM | POA: Insufficient documentation

## 2016-02-20 DIAGNOSIS — K589 Irritable bowel syndrome without diarrhea: Secondary | ICD-10-CM | POA: Insufficient documentation

## 2016-02-20 DIAGNOSIS — Z9079 Acquired absence of other genital organ(s): Secondary | ICD-10-CM | POA: Diagnosis not present

## 2016-02-20 DIAGNOSIS — Z8543 Personal history of malignant neoplasm of ovary: Secondary | ICD-10-CM | POA: Insufficient documentation

## 2016-02-20 DIAGNOSIS — K5900 Constipation, unspecified: Secondary | ICD-10-CM | POA: Insufficient documentation

## 2016-02-20 DIAGNOSIS — N840 Polyp of corpus uteri: Secondary | ICD-10-CM | POA: Diagnosis not present

## 2016-02-20 DIAGNOSIS — F419 Anxiety disorder, unspecified: Secondary | ICD-10-CM | POA: Diagnosis not present

## 2016-02-20 DIAGNOSIS — R8290 Unspecified abnormal findings in urine: Secondary | ICD-10-CM

## 2016-02-20 DIAGNOSIS — Z90710 Acquired absence of both cervix and uterus: Secondary | ICD-10-CM | POA: Insufficient documentation

## 2016-02-20 DIAGNOSIS — C569 Malignant neoplasm of unspecified ovary: Secondary | ICD-10-CM

## 2016-02-20 DIAGNOSIS — F329 Major depressive disorder, single episode, unspecified: Secondary | ICD-10-CM | POA: Insufficient documentation

## 2016-02-20 DIAGNOSIS — Z90722 Acquired absence of ovaries, bilateral: Secondary | ICD-10-CM | POA: Insufficient documentation

## 2016-02-20 DIAGNOSIS — I341 Nonrheumatic mitral (valve) prolapse: Secondary | ICD-10-CM | POA: Diagnosis not present

## 2016-02-20 LAB — URINALYSIS, MICROSCOPIC - CHCC
BILIRUBIN (URINE): NEGATIVE
Blood: NEGATIVE
Glucose: NEGATIVE mg/dL
Ketones: NEGATIVE mg/dL
Leukocyte Esterase: NEGATIVE
NITRITE: NEGATIVE
PH: 6 (ref 4.6–8.0)
Protein: NEGATIVE mg/dL
Specific Gravity, Urine: 1.01 (ref 1.003–1.035)
Urobilinogen, UR: 0.2 mg/dL (ref 0.2–1)

## 2016-02-20 NOTE — Progress Notes (Signed)
Consult Note: Gyn-Onc   [name] [name] 65 y.o. female  Chief Complaint  Patient presents with  . Ovarian Cancer    Follow up    Assessment: stage IIIc ovarian cancer (optimally debulked). Status post 6 cycles of carboplatin and Taxol given in a dose dense regimencompleted May 2014.[name] She is clinically free of disease.  Concentrated foul-smelling urine was no associated symptoms of a urinary tract infection.  Plan urine will be sent for urinalysis. If it is suspicious we will obtain a culture. We will plan on seeing the patient in 6 months and obtain a CA 125 just prior to that visit. The patient continues to [facility] for constipation.   She will continue to have annual mammograms.  Interval history: . Since her last visit she's done well. She had a recent CA 125 value of 8.8 units per mL   . She has no GI or GU symptoms except for a concentrated foul-smelling urine. She denies any dysuria or flank pain fever or chills..  . She has no pelvic pain pressure vaginal discharge.    She had mammograms recently which were normal. Functional status is excellent.   [id number] is a 65 year old gravida 2 para 2 who went through menopause at the age about 65 years of age. She never took any hormone replacement therapy. She states that for the past 6 months she's experienced some pelvic soreness in pressure. She has noted some increasing urinary frequency. With these symptoms she had a PET scan on October 9. It revealed significant soft tissue thickening seen throughout the greater omentum suspicious for intraperitoneal carcinoma. There is minimal ascites in the pelvis. it revealed the areas of omental and peritoneal nodularity were hypermetabolic. There is a dominant pelvic omental mass measuring 10.4 x 3.3 cm. There is a small omental implants in the left side of the abdomen measuring 1.1 cm. There were multiple pelvic foci That wereperitoneal based And hypermetabolic. These included the cul-de-sac.  She has small volume of ascites. She underwent a CT-guided biopsy on July 24, 2012. It revealed metastatic carcinoma. By immunohistochemistry, malignant cells were positive for WT 1, cytokeratin 7, estrogen receptor. There were negative for TTF-1, progesterone receptor, cytokeratin 20, CEA, CD X2, and S100. Overall, the histologic features were highly suggestive of a primary gynecologic origin. Her CA 125 on October 10 was elevated at 69.  She underwent exploratory laparotomy on 08/22/2012 findings of stage IIIC ovarian cancer. Patient was optimally debulked. Debulking, however, required a rectosigmoid resection as well as total omentectomy total nominal hysterectomy bilateral salpingo-oophorectomy. She was optimally debulked with only 5 mm nodules on the right diaphragm as residual disease.  SURGICAL FINDINGS: At exploratory laparotomy the infracolic omentum was nearly replaced with tumor. This measured approximately 12 x 16 cm. There were 5 mm nodules on the right diaphragm. The small bowel serosa and mesentery were normal. In the pelvis it appeared that the left ovary was a primary source. It was cystic and densely adherent to the sigmoid mesentery. The right ovary was approximately 4 cm in diameter and had multiple excrescences on it. There were extensive tumor implants on the bladder flap and involving the sigmoid mesentery and serosa, and posterior cul-de-sac. At the conclusion of the surgical procedure the only residual disease were 5 mm nodules on the right diaphragm. Chemotherapy was initiated in December 2013 using the dose dense regimen of carboplatin and Taxol   She received 6 cycles of carboplatin and Taxol the last administered on 02/06/2013. At that  time her CA 125 is 5.6 units per mL.  Review of Systems:10 point review of systems is negative as noted above.   Vitals: Blood pressure 112/80, pulse 72, temperature 98.4 F (36.9 C), temperature source Oral, resp. rate 18, height 5\' 5"  (1.651  m), weight 147 lb 11.2 oz (66.996 kg), SpO2 97 %.  Physical Exam: General : The patient is a healthy woman in no acute distress.  HEENT: normocephalic, extraoccular movements normal; neck is supple without thyromegally  Lynphnodes: Supraclavicular and inguinal nodes not enlarged   Abdomen: Soft, non-tender, no ascites, no organomegally, no masses, no hernias  Pelvic:  EGBUS: Normal female  Vagina: Normal, no lesions no evidence of bleeding. Urethra and Bladder: Normal, non-tender  Cervix: Surgically absent  Uterus: Surgically absent   bi-manual examination: Non-tender; no adenxal masses or nodularity  Rectal: normal sphincter tone, no masses, no blood  Lower extremities: No edema or varicosities. Normal range of motion      Allergies  Allergen Reactions  . Atorvastatin Other (See Comments)    Leg pain  . Macrodantin Other (See Comments)    Unknown Reaction  . Decongest-Aid [Pseudoephedrine] Palpitations    Past Medical History  Diagnosis Date  . Rheumatoid arthritis(714.0) ~ 2010    dr Ouida Sills  . Endometrial polyp   . Elevated cholesterol   . Ovarian ca ([place]) 07-2012       . DJD (degenerative joint disease)   . IRRITABLE BOWEL SYNDROME, HX OF 05/15/2008  . Anxiety and depression    . MVP (mitral valve prolapse)     occ palpitations   . Shingles     Past Surgical History  Procedure Laterality Date  . Hand surgery  1996  . Tubal ligation  1980  . Hysteroscopy  2004    D&C  . Dilation and curettage of uterus  2004    WITH HYSTEROSCOPY  . Neck surgery  2009    SPURS  . Cyst on neck  2011  . Abdominal hysterectomy  08/22/2012    Procedure: HYSTERECTOMY ABDOMINAL;  Surgeon: Alvino Chapel, MD;  Location: [facility];  Service: Gynecology;  Laterality: N/A;  . Salpingoophorectomy  08/22/2012    Procedure: SALPINGO OOPHORECTOMY;  Surgeon: Alvino Chapel, MD;  Location: [facility];  Service: Gynecology;  Laterality: Bilateral;  . Omentectomy  08/22/2012     Procedure: OMENTECTOMY;  Surgeon: Alvino Chapel, MD;  Location: [facility];  Service: Gynecology;  Laterality: N/A;  . Debulking  08/22/2012    Procedure: DEBULKING;  Surgeon: Alvino Chapel, MD;  Location: [facility];  Service: Gynecology;  Laterality: N/A;  Radical tumor debulking, Bilateral Ureterolysis  . Colostomy revision  08/22/2012    Procedure: COLON RESECTION SIGMOID;  Surgeon: Alvino Chapel, MD;  Location: [facility];  Service: Gynecology;;  Rectal Sigmoid resection and low rectal anastomosis  . Back surgery  2008    Dr Trenton Gammon  . Colonoscopy  04-20-2004  . Colonoscopy w/ biopsies  07/2014    Current Outpatient Prescriptions  Medication Sig Dispense Refill  . docusate sodium (COLACE) 100 MG capsule Take 100 mg by mouth daily as needed.     [name] escitalopram (LEXAPRO) 20 MG tablet Take 20 mg by mouth daily. Take one and 1/2 tablet in the morning    . fish oil-omega-3 fatty acids 1000 MG capsule Take 1 g by mouth daily.      . Multiple Vitamin (MULTIVITAMIN WITH MINERALS) TABS Take 1 tablet by mouth daily.    [name]  pravastatin (PRAVACHOL) 40 MG tablet Take 1 tablet (40 mg total) by mouth daily. 90 tablet 3  . Flaxseed, Linseed, (FLAX SEED OIL) 1000 MG CAPS Take 1 capsule by mouth daily. Reported on 02/20/2016    . Melatonin 3 MG TABS Take 1 tablet by mouth at bedtime. Reported on 02/20/2016     No current facility-administered medications for this visit.   Facility-Administered Medications Ordered in Other Visits  Medication Dose Route Frequency Provider Last Rate Last Dose  . sodium chloride 0.9 % injection 10 mL  10 mL Intracatheter PRN Ladell Pier, MD   10 mL at 01/16/13 1359    Social History   Social History  . Marital Status: Married    Spouse Name: N/A  . Number of Children: 2  . Years of Education: N/A   Occupational History  . disability d/t RA    Social History Main Topics  . Smoking status: Never Smoker   . Smokeless tobacco: Never Used  .  Alcohol Use: No  . Drug Use: No  . Sexual Activity: No   Other Topics Concern  . Not on file   Social History Narrative   [name] is her daughter    Household-- pr and husband    Family History  Problem Relation Age of Onset  . Heart disease Mother   . Osteoporosis Sister   . Bladder Cancer Father 19  . Lymphoma Paternal Aunt   . Colon cancer Neg Hx   . Rectal cancer Neg Hx   . Stomach cancer Neg Hx   . Diabetes Neg Hx   . Stroke Neg Hx       CLARKE-PEARSON,Leonides Minder L, MD 02/20/2016, 1:42 PM

## 2016-02-20 NOTE — Telephone Encounter (Signed)
Called pt to informed her about results of her U/A. No answer but left a detailed message to pt's VM that her urine was negative for any infection and encouraged her to increase water intake and if any symptoms are still present to call her PCP or feel free to call us.

## 2016-02-20 NOTE — Patient Instructions (Signed)
We will call you with your U/A results , plan to follow up in 6 months with Dr Fermin Schwab. Call with any changes , questions or concerns.  Thank you!

## 2016-03-08 ENCOUNTER — Encounter: Payer: Self-pay | Admitting: Internal Medicine

## 2016-03-08 ENCOUNTER — Ambulatory Visit (INDEPENDENT_AMBULATORY_CARE_PROVIDER_SITE_OTHER): Payer: Medicare Other | Admitting: Internal Medicine

## 2016-03-08 VITALS — BP 116/80 | HR 61 | Temp 98.1°F | Ht 64.0 in | Wt 146.5 lb

## 2016-03-08 DIAGNOSIS — Z0000 Encounter for general adult medical examination without abnormal findings: Secondary | ICD-10-CM | POA: Diagnosis not present

## 2016-03-08 DIAGNOSIS — Z1321 Encounter for screening for nutritional disorder: Secondary | ICD-10-CM

## 2016-03-08 DIAGNOSIS — Z23 Encounter for immunization: Secondary | ICD-10-CM | POA: Diagnosis not present

## 2016-03-08 DIAGNOSIS — E2839 Other primary ovarian failure: Secondary | ICD-10-CM

## 2016-03-08 DIAGNOSIS — E559 Vitamin D deficiency, unspecified: Secondary | ICD-10-CM

## 2016-03-08 LAB — COMPREHENSIVE METABOLIC PANEL
ALK PHOS: 66 U/L (ref 39–117)
ALT: 27 U/L (ref 0–35)
AST: 21 U/L (ref 0–37)
Albumin: 4.3 g/dL (ref 3.5–5.2)
BILIRUBIN TOTAL: 0.7 mg/dL (ref 0.2–1.2)
BUN: 19 mg/dL (ref 6–23)
CO2: 30 meq/L (ref 19–32)
Calcium: 9.6 mg/dL (ref 8.4–10.5)
Chloride: 103 mEq/L (ref 96–112)
Creatinine, Ser: 0.72 mg/dL (ref 0.40–1.20)
GFR: 86.06 mL/min (ref >60.00)
GLUCOSE: 86 mg/dL (ref 70–99)
POTASSIUM: 4.2 meq/L (ref 3.5–5.1)
SODIUM: 139 meq/L (ref 135–145)
TOTAL PROTEIN: 7 g/dL (ref 6.0–8.3)

## 2016-03-08 LAB — CBC
HCT: 43.4 % (ref 36.0–46.0)
HEMOGLOBIN: 14.5 g/dL (ref 12.0–15.0)
MCHC: 33.5 g/dL (ref 30.0–36.0)
MCV: 96.2 fl (ref 78.0–100.0)
Platelets: 246 10*3/uL (ref 150.0–400.0)
RBC: 4.52 Mil/uL (ref 3.87–5.11)
RDW: 13.4 % (ref 11.5–15.5)
WBC: 6.3 10*3/uL (ref 4.0–10.5)

## 2016-03-08 LAB — VITAMIN D 25 HYDROXY (VIT D DEFICIENCY, FRACTURES): VITD: 17.96 ng/mL — ABNORMAL LOW (ref 30.00–100.00)

## 2016-03-08 LAB — LIPID PANEL
CHOL/HDL RATIO: 4
Cholesterol: 230 mg/dL — ABNORMAL HIGH (ref 0–200)
HDL: 53.9 mg/dL (ref >39.00)
LDL CALC: 140 mg/dL — AB (ref 0–99)
NONHDL: 175.61
Triglycerides: 177 mg/dL — ABNORMAL HIGH (ref 0.0–149.0)
VLDL: 35.4 mg/dL (ref 0.0–40.0)

## 2016-03-08 MED ORDER — PRAVASTATIN SODIUM 40 MG PO TABS: 40.0000 mg | ORAL_TABLET | Freq: Every day | ORAL | Status: DC

## 2016-03-08 NOTE — Progress Notes (Signed)
HPI:  Pt presents to the clinic today for her Medicare Wellness Exam.  Past Medical History  Diagnosis Date  . Rheumatoid arthritis(714.0) ~ 2010    dr Ouida Sills  . Endometrial polyp   . Elevated cholesterol   . Ovarian ca ([place]) 07-2012       . DJD (degenerative joint disease)   . IRRITABLE BOWEL SYNDROME, HX OF 05/15/2008  . Anxiety and depression    . MVP (mitral valve prolapse)     occ palpitations   . Shingles     Current Outpatient Prescriptions  Medication Sig Dispense Refill  . docusate sodium (COLACE) 100 MG capsule Take 100 mg by mouth daily as needed.     [name] escitalopram (LEXAPRO) 20 MG tablet Take 20 mg by mouth daily. Take one and 1/2 tablet in the morning    . fish oil-omega-3 fatty acids 1000 MG capsule Take 1 g by mouth daily.      . Flaxseed, Linseed, (FLAX SEED OIL) 1000 MG CAPS Take 1 capsule by mouth daily. Reported on 02/20/2016    . Melatonin 3 MG TABS Take 1 tablet by mouth at bedtime. Reported on 02/20/2016    . Multiple Vitamin (MULTIVITAMIN WITH MINERALS) TABS Take 1 tablet by mouth daily.    . pravastatin (PRAVACHOL) 40 MG tablet Take 1 tablet (40 mg total) by mouth daily. 90 tablet 3   No current facility-administered medications for this visit.   Facility-Administered Medications Ordered in Other Visits  Medication Dose Route Frequency Provider Last Rate Last Dose  . sodium chloride 0.9 % injection 10 mL  10 mL Intracatheter PRN Ladell Pier, MD   10 mL at 01/16/13 1359    Allergies  Allergen Reactions  . Atorvastatin Other (See Comments)    Leg pain  . Macrodantin Other (See Comments)    Unknown Reaction  . Decongest-Aid [Pseudoephedrine] Palpitations    Family History  Problem Relation Age of Onset  . Heart disease Mother   . Osteoporosis Sister   . Bladder Cancer Father 59  . Lymphoma Paternal Aunt   . Colon cancer Neg Hx   . Rectal cancer Neg Hx   . Stomach cancer Neg Hx   . Diabetes Neg Hx   . Stroke Neg Hx     Social History    Social History  . Marital Status: Married    Spouse Name: N/A  . Number of Children: 2  . Years of Education: N/A   Occupational History  . disability d/t RA    Social History Main Topics  . Smoking status: Never Smoker   . Smokeless tobacco: Never Used  . Alcohol Use: No  . Drug Use: No  . Sexual Activity: No   Other Topics Concern  . Not on file   Social History Narrative   [name] is her daughter    Household-- pr and husband    Hospitiliaztions: None  Health Maintenance:    Flu: 08/2015  Tetanus: 10/2014  Pneumovax: 10/2014  Prevnar: never  Zostavax: never  Mammogram: 12/2015  Pap Smear: 11/2014, has had hysterectomy  Bone Density: 2010  Colon Screening: 07/2014  Eye Doctor: yearly  Dental Exam: yearly   Providers:   PCP: Webb Silversmith, NP-C  Gyn Oncologist: Dr. Fermin Schwab     I have personally reviewed and have noted:  1. The patient's medical and social history 2. Their use of alcohol, tobacco or illicit drugs 3. Their current medications and supplements 4. The patient's functional  ability including ADL's, fall risks, home  safety risks and hearing or visual impairment. 5. Diet and physical activities 6. Evidence for depression or mood disorder  Subjective:   Review of Systems:   Constitutional: Denies fever, malaise, fatigue, headache or abrupt weight changes.  HEENT: Denies eye pain, eye redness, ear pain, ringing in the ears, wax buildup, runny nose, nasal congestion, bloody nose, or sore throat. Respiratory: Denies difficulty breathing, shortness of breath, cough or sputum production.   Cardiovascular: Denies chest pain, chest tightness, palpitations or swelling in the hands or feet.  Gastrointestinal: Denies abdominal pain, bloating, constipation, diarrhea or blood in the stool.  GU: Denies urgency, frequency, pain with urination, burning sensation, blood in urine, odor or discharge. Musculoskeletal: Pt reports joint pain.  Denies decrease in range of motion, difficulty with gait, muscle pain or joint swelling.  Skin: Denies redness, rashes, lesions or ulcercations.  Neurological: Denies dizziness, difficulty with memory, difficulty with speech or problems with balance and coordination.  Psych: Pt has history of anxiety and depression. Denies SI/HI.  No other specific complaints in a complete review of systems (except as listed in HPI above).  Objective:  PE:   BP 116/80 mmHg  Pulse 61  Temp(Src) 98.1 F (36.7 C) (Oral)  Ht 5\' 4"  (1.626 m)  Wt 146 lb 8 oz (66.452 kg)  BMI 25.13 kg/m2  SpO2 97%  Wt Readings from Last 3 Encounters:  02/20/16 147 lb 11.2 oz (66.996 kg)  09/04/15 142 lb (64.411 kg)  08/26/15 143 lb (64.864 kg)    General: Appears her stated age, well developed, well nourished in NAD. Cardiovascular: Normal rate and rhythm. S1,S2 noted.  No murmur, rubs or gallops noted. No JVD or BLE edema. No carotid bruits noted. Pulmonary/Chest: Normal effort and positive vesicular breath sounds. No respiratory distress. No wheezes, rales or ronchi noted.  Neurological: Alert and oriented.  Psychiatric: Mood and affect normal. Behavior is normal. Judgment and thought content normal.    BMET    Component Value Date/Time   NA 140 10/18/2014 1024   NA 139 01/02/2013 0821   K 3.8 10/18/2014 1024   K 4.0 01/02/2013 0821   CL 104 10/18/2014 1024   CL 104 01/02/2013 0821   CO2 29 10/18/2014 1024   CO2 26 01/02/2013 0821   GLUCOSE 90 10/18/2014 1024   GLUCOSE 86 01/02/2013 0821   BUN 22 10/18/2014 1024   BUN 21.3 01/02/2013 0821   CREATININE 0.69 10/18/2014 1024   CREATININE 0.7 01/02/2013 0821   CALCIUM 9.6 10/18/2014 1024   CALCIUM 9.4 01/02/2013 0821   GFRNONAA >90 08/23/2012 0340   GFRAA >90 08/23/2012 0340    Lipid Panel     Component Value Date/Time   CHOL 209* 08/26/2015 1138   TRIG 152.0* 08/26/2015 1138   HDL 52.20 08/26/2015 1138   CHOLHDL 4 08/26/2015 1138   VLDL 30.4  08/26/2015 1138   LDLCALC 126* 08/26/2015 1138    CBC    Component Value Date/Time   WBC 6.2 10/18/2014 1024   WBC 5.5 03/26/2013 0842   RBC 4.60 10/18/2014 1024   RBC 3.36* 03/26/2013 0842   HGB 14.8 10/18/2014 1024   HGB 12.8 03/26/2013 0842   HCT 45.6 10/18/2014 1024   HCT 37.4 03/26/2013 0842   PLT 245.0 10/18/2014 1024   PLT 194 03/26/2013 0842   MCV 99.2 10/18/2014 1024   MCV 111.1* 03/26/2013 0842   MCH 36.6* 04/03/2013 1441   MCH 38.2* 03/26/2013 [id number]  MCHC 32.5 10/18/2014 1024   MCHC 34.3 03/26/2013 0842   RDW 13.3 10/18/2014 1024   RDW 14.5 03/26/2013 0842   LYMPHSABS 1.5 10/18/2014 1024   LYMPHSABS 1.0 03/26/2013 0842   MONOABS 0.6 10/18/2014 1024   MONOABS 0.5 03/26/2013 0842   EOSABS 0.1 10/18/2014 1024   EOSABS 0.1 03/26/2013 0842   BASOSABS 0.0 10/18/2014 1024   BASOSABS 0.0 03/26/2013 0842    Hgb A1C No results found for: HGBA1C    Assessment and Plan:   Medicare Annual Wellness Visit:  Diet: Heart healthy Physical activity: She walks a few days per week Depression/mood screen: Positive but well controlled on medication Hearing: Intact to whispered voice Visual acuity: Grossly normal, performs annual eye exam  ADLs: Capable Fall risk: None Home safety: Good Cognitive evaluation: Intact to orientation, naming, recall and repetition EOL planning: Adv directives, full code/ I agree  Preventative Medicine:  CBC, CMET, Lipid and Vit D today. Prevnar given today, bone density ordered  Next appointment: 6 months follow up chronic condtions

## 2016-03-08 NOTE — Addendum Note (Signed)
Addended by: Marchia Bond on: 03/08/2016 04:02 PM   Modules accepted: Miquel Dunn

## 2016-03-08 NOTE — Addendum Note (Signed)
Addended by: Lurlean Nanny on: 03/08/2016 05:05 PM   Modules accepted: Orders, SmartSet

## 2016-03-08 NOTE — Patient Instructions (Signed)

## 2016-03-08 NOTE — Progress Notes (Signed)
Pre visit review using our clinic review tool, if applicable. No additional management support is needed unless otherwise documented below in the visit note. 

## 2016-03-10 MED ORDER — VITAMIN D (ERGOCALCIFEROL) 1.25 MG (50000 UNIT) PO CAPS: 50000.0000 [IU] | ORAL_CAPSULE | ORAL | Status: DC

## 2016-03-10 NOTE — Addendum Note (Signed)
Addended by: Lurlean Nanny on: 03/10/2016 10:08 AM   Modules accepted: Orders, SmartSet

## 2016-03-17 DIAGNOSIS — F411 Generalized anxiety disorder: Secondary | ICD-10-CM | POA: Diagnosis not present

## 2016-03-25 ENCOUNTER — Ambulatory Visit
Admission: RE | Admit: 2016-03-25 | Discharge: 2016-03-25 | Disposition: A | Discharge status: Home / Self Care | Payer: Medicare Other | Source: Ambulatory Visit | Attending: Internal Medicine | Admitting: Internal Medicine

## 2016-03-25 DIAGNOSIS — Z780 Asymptomatic menopausal state: Secondary | ICD-10-CM | POA: Diagnosis not present

## 2016-03-25 DIAGNOSIS — E2839 Other primary ovarian failure: Secondary | ICD-10-CM

## 2016-03-25 DIAGNOSIS — M85851 Other specified disorders of bone density and structure, right thigh: Secondary | ICD-10-CM | POA: Diagnosis not present

## 2016-04-19 DIAGNOSIS — F411 Generalized anxiety disorder: Secondary | ICD-10-CM | POA: Diagnosis not present

## 2016-05-27 ENCOUNTER — Ambulatory Visit (INDEPENDENT_AMBULATORY_CARE_PROVIDER_SITE_OTHER): Payer: Medicare Other | Admitting: Internal Medicine

## 2016-05-27 ENCOUNTER — Encounter: Payer: Self-pay | Admitting: Internal Medicine

## 2016-05-27 VITALS — BP 122/80 | HR 78 | Temp 98.8°F | Wt 149.5 lb

## 2016-05-27 DIAGNOSIS — T148 Other injury of unspecified body region: Secondary | ICD-10-CM | POA: Diagnosis not present

## 2016-05-27 DIAGNOSIS — W57XXXA Bitten or stung by nonvenomous insect and other nonvenomous arthropods, initial encounter: Secondary | ICD-10-CM | POA: Diagnosis not present

## 2016-05-27 MED ORDER — PERMETHRIN 5 % EX CREA: 1.0000 "application " | TOPICAL_CREAM | Freq: Once | CUTANEOUS | 0 refills | Status: AC

## 2016-05-27 NOTE — Patient Instructions (Signed)

## 2016-05-27 NOTE — Progress Notes (Signed)
Subjective:    Patient ID: [name] [name], female    DOB: 1950/10/31, 65 y.o.   MRN: [id number]  HPI  Pt presents to the clinic today with c/o a rash. She noticed this 3-4 days ago. The rash is pretty much all over her body. It is very itchy. She has recently changed her detergent to TIDE. She also went out of town to [facility] on vacation 2 weeks ago. She has tried Benadryl at night without any relief. No one in her home has a similar rash.   Review of Systems  Past Medical History:  Diagnosis Date  . Anxiety and depression    . DJD (degenerative joint disease)   . Elevated cholesterol   . Endometrial polyp   . IRRITABLE BOWEL SYNDROME, HX OF 05/15/2008  . MVP (mitral valve prolapse)    occ palpitations   . Ovarian ca ([place]) 07-2012      . Rheumatoid arthritis(714.0) ~ 2010   dr Ouida Sills  . Shingles     Current Outpatient Prescriptions  Medication Sig Dispense Refill  . docusate sodium (COLACE) 100 MG capsule Take 100 mg by mouth daily as needed.     [name] escitalopram (LEXAPRO) 20 MG tablet Take 10 mg by mouth daily.     . fish oil-omega-3 fatty acids 1000 MG capsule Take 1 g by mouth daily.      . Flaxseed, Linseed, (FLAX SEED OIL) 1000 MG CAPS Take 1 capsule by mouth daily. Reported on 02/20/2016    . Melatonin 3 MG TABS Take 1 tablet by mouth at bedtime. Reported on 02/20/2016    . Multiple Vitamin (MULTIVITAMIN WITH MINERALS) TABS Take 1 tablet by mouth daily.    . pravastatin (PRAVACHOL) 40 MG tablet Take 1 tablet (40 mg total) by mouth daily. 90 tablet 3  . Vitamin D, Ergocalciferol, (DRISDOL) 50000 units CAPS capsule Take 1 capsule (50,000 Units total) by mouth every 7 (seven) days. 12 capsule 0   No current facility-administered medications for this visit.    Facility-Administered Medications Ordered in Other Visits  Medication Dose Route Frequency Provider Last Rate Last Dose  . sodium chloride 0.9 % injection 10 mL  10 mL Intracatheter PRN Ladell Pier, MD   10  mL at 01/16/13 1359    Allergies  Allergen Reactions  . Atorvastatin Other (See Comments)    Leg pain  . Macrodantin Other (See Comments)    Unknown Reaction  . Decongest-Aid [Pseudoephedrine] Palpitations    Family History  Problem Relation Age of Onset  . Heart disease Mother   . Osteoporosis Sister   . Bladder Cancer Father 28  . Lymphoma Paternal Aunt   . Colon cancer Neg Hx   . Rectal cancer Neg Hx   . Stomach cancer Neg Hx   . Diabetes Neg Hx   . Stroke Neg Hx     Social History   Social History  . Marital status: Married    Spouse name: N/A  . Number of children: 2  . Years of education: N/A   Occupational History  . disability d/t RA    Social History Main Topics  . Smoking status: Never Smoker  . Smokeless tobacco: Never Used  . Alcohol use No  . Drug use: No  . Sexual activity: No   Other Topics Concern  . Not on file   Social History Narrative   [name] is her daughter    Household-- pr and husband  Constitutional: Denies fever, malaise, fatigue, headache or abrupt weight changes.  Skin: Pt reports rash. Denies redness.    No other specific complaints in a complete review of systems (except as listed in HPI above).     Objective:   Physical Exam   . BP 122/80   Pulse 78   Temp 98.8 F (37.1 C) (Oral)   Wt 149 lb 8 oz (67.8 kg)   SpO2 97%   BMI 25.66 kg/m  Wt Readings from Last 3 Encounters:  05/27/16 149 lb 8 oz (67.8 kg)  03/08/16 146 lb 8 oz (66.5 kg)  02/20/16 147 lb 11.2 oz (67 kg)    General: Appears her stated age, well developed, well nourished in NAD. Skin: Scattered, papular erythematous scabbed over lesions noted on legs, back, abdomen and chest. Grouped together in the groin area.   BMET    Component Value Date/Time   NA 139 03/08/2016 1509   NA 139 01/02/2013 0821   K 4.2 03/08/2016 1509   K 4.0 01/02/2013 0821   CL 103 03/08/2016 1509   CL 104 01/02/2013 0821   CO2 30 03/08/2016 1509   CO2  26 01/02/2013 0821   GLUCOSE 86 03/08/2016 1509   GLUCOSE 86 01/02/2013 0821   BUN 19 03/08/2016 1509   BUN 21.3 01/02/2013 0821   CREATININE 0.72 03/08/2016 1509   CREATININE 0.7 01/02/2013 0821   CALCIUM 9.6 03/08/2016 1509   CALCIUM 9.4 01/02/2013 0821   GFRNONAA >90 08/23/2012 0340   GFRAA >90 08/23/2012 0340    Lipid Panel     Component Value Date/Time   CHOL 230 (H) 03/08/2016 1509   TRIG 177.0 (H) 03/08/2016 1509   HDL 53.90 03/08/2016 1509   CHOLHDL 4 03/08/2016 1509   VLDL 35.4 03/08/2016 1509   LDLCALC 140 (H) 03/08/2016 1509    CBC    Component Value Date/Time   WBC 6.3 03/08/2016 1509   RBC 4.52 03/08/2016 1509   HGB 14.5 03/08/2016 1509   HGB 12.8 03/26/2013 0842   HCT 43.4 03/08/2016 1509   HCT 37.4 03/26/2013 0842   PLT 246.0 03/08/2016 1509   PLT 194 03/26/2013 0842   MCV 96.2 03/08/2016 1509   MCV 111.1 (H) 03/26/2013 0842   MCH 36.6 (H) 04/03/2013 1441   MCHC 33.5 03/08/2016 1509   RDW 13.4 03/08/2016 1509   RDW 14.5 03/26/2013 0842   LYMPHSABS 1.5 10/18/2014 1024   LYMPHSABS 1.0 03/26/2013 0842   MONOABS 0.6 10/18/2014 1024   MONOABS 0.5 03/26/2013 0842   EOSABS 0.1 10/18/2014 1024   EOSABS 0.1 03/26/2013 0842   BASOSABS 0.0 10/18/2014 1024   BASOSABS 0.0 03/26/2013 0842    Hgb A1C No results found for: HGBA1C         Assessment & Plan:   Rash, resembling bed bug bites:  eRx for Permetherin cream- apply once Ok to continue Benadryl Discussed washing clothes and sheets in hot water  RTC as needed or if symptoms persist or worsen Webb Silversmith, NP

## 2016-06-10 DIAGNOSIS — F411 Generalized anxiety disorder: Secondary | ICD-10-CM | POA: Diagnosis not present

## 2016-06-28 DIAGNOSIS — H5203 Hypermetropia, bilateral: Secondary | ICD-10-CM | POA: Diagnosis not present

## 2016-07-22 ENCOUNTER — Other Ambulatory Visit: Payer: Self-pay | Admitting: Gynecologic Oncology

## 2016-07-22 DIAGNOSIS — C569 Malignant neoplasm of unspecified ovary: Secondary | ICD-10-CM

## 2016-08-10 ENCOUNTER — Telehealth: Payer: Self-pay

## 2016-08-10 NOTE — Telephone Encounter (Signed)
Patient's call returned , patient states she has been constipated for several days and has taken stool softeners and Miralax . Patient states she has had a small BM today,but still feels constipated. With instructed the patient to take Miracle 1 capful two times day until she has a BM . Patient states understanding , denies further questions at this time ,Joylene John, APNP aware.

## 2016-08-13 ENCOUNTER — Other Ambulatory Visit: Payer: Medicare Other

## 2016-08-18 ENCOUNTER — Other Ambulatory Visit (HOSPITAL_BASED_OUTPATIENT_CLINIC_OR_DEPARTMENT_OTHER): Payer: Medicare Other

## 2016-08-18 ENCOUNTER — Other Ambulatory Visit: Payer: Medicare Other

## 2016-08-18 DIAGNOSIS — C569 Malignant neoplasm of unspecified ovary: Secondary | ICD-10-CM | POA: Diagnosis not present

## 2016-08-18 DIAGNOSIS — R8290 Unspecified abnormal findings in urine: Secondary | ICD-10-CM | POA: Diagnosis not present

## 2016-08-19 LAB — CA 125: CANCER ANTIGEN (CA) 125: 10.2 U/mL (ref 0.0–38.1)

## 2016-08-23 ENCOUNTER — Encounter: Payer: Self-pay | Admitting: Gynecology

## 2016-08-23 ENCOUNTER — Ambulatory Visit: Payer: Medicare Other | Attending: Gynecology | Admitting: Gynecology

## 2016-08-23 VITALS — BP 117/75 | HR 79 | Temp 97.8°F | Resp 18 | Ht 65.0 in | Wt 147.0 lb

## 2016-08-23 DIAGNOSIS — K5900 Constipation, unspecified: Secondary | ICD-10-CM

## 2016-08-23 DIAGNOSIS — C569 Malignant neoplasm of unspecified ovary: Secondary | ICD-10-CM

## 2016-08-23 DIAGNOSIS — Z8543 Personal history of malignant neoplasm of ovary: Secondary | ICD-10-CM

## 2016-08-23 DIAGNOSIS — R1031 Right lower quadrant pain: Secondary | ICD-10-CM

## 2016-08-23 DIAGNOSIS — Z79899 Other long term (current) drug therapy: Secondary | ICD-10-CM | POA: Insufficient documentation

## 2016-08-23 NOTE — Patient Instructions (Signed)
Plan to have a CT scan of the abdomen/pelvis to evaluate for recurrence on Wednesday, November 22 at [facility], arrive at 9:15am.  We will contact you with the results and recommendations from Dr. Fermin Schwab.  Please call for any questions or concerns.

## 2016-08-23 NOTE — Progress Notes (Signed)
Consult Note: Gyn-Onc   [name] [name] 65 y.o. female  No chief complaint on file.   Assessment: stage IIIc ovarian cancer (optimally debulked). Status post 6 cycles of carboplatin and Taxol given in a dose dense regimencompleted May 2014.[name] She is clinically free of Z's.  However, she has 3 months of constipation and intermittent right lower quadrant pain which is of some concern. In addition on pelvic exam there is some fullness and tenderness on the right pelvic sidewall.  Plan  given the patient's new onset of GI symptoms, I'm concerned she may have a recurrent ovarian cancer and therefore would recommend we obtain a CT scan of the abdomen and pelvis in the near future. A CA-125 value today (10 units per mL) was shared with the patient.   Interval history: . Since her last her visit the patient reports she's now had 3 months of constipation. She is actively managing this with mirror lax prunes and other laxatives. Prior to having a bowel movement the patient has some right lower quadrant pain which was relieved by the bowel movement. She denies any nausea or vomiting or bloating her appetite is good. She has no urinary tract symptoms.   [name] [name]:[id number] is a 65 year old gravida 2 para 2 who went through menopause at the age about 65 years of age. She never took any hormone replacement therapy. She states that for the past 6 months she's experienced some pelvic soreness in pressure. She has noted some increasing urinary frequency. With these symptoms she had a PET scan on October 9. It revealed significant soft tissue thickening seen throughout the greater omentum suspicious for intraperitoneal carcinoma. There is minimal ascites in the pelvis. it revealed the areas of omental and peritoneal nodularity were hypermetabolic. There is a dominant pelvic omental mass measuring 10.4 x 3.3 cm. There is a small omental implants in the left side of the abdomen measuring 1.1 cm. There were multiple pelvic foci  That wereperitoneal based And hypermetabolic. These included the cul-de-sac. She has small volume of ascites. She underwent a CT-guided biopsy on July 24, 2012. It revealed metastatic carcinoma. By immunohistochemistry, malignant cells were positive for WT 1, cytokeratin 7, estrogen receptor. There were negative for TTF-1, progesterone receptor, cytokeratin 20, CEA, CD X2, and S100. Overall, the histologic features were highly suggestive of a primary gynecologic origin. Her CA 125 on October 10 was elevated at 69.  She underwent exploratory laparotomy on 08/22/2012 findings of stage IIIC ovarian cancer. Patient was optimally debulked. Debulking, however, required a rectosigmoid resection as well as total omentectomy total nominal hysterectomy bilateral salpingo-oophorectomy. She was optimally debulked with only 5 mm nodules on the right diaphragm as residual disease.  SURGICAL FINDINGS: At exploratory laparotomy the infracolic omentum was nearly replaced with tumor. This measured approximately 12 x 16 cm. There were 5 mm nodules on the right diaphragm. The small bowel serosa and mesentery were normal. In the pelvis it appeared that the left ovary was a primary source. It was cystic and densely adherent to the sigmoid mesentery. The right ovary was approximately 4 cm in diameter and had multiple excrescences on it. There were extensive tumor implants on the bladder flap and involving the sigmoid mesentery and serosa, and posterior cul-de-sac. At the conclusion of the surgical procedure the only residual disease were 5 mm nodules on the right diaphragm. Chemotherapy was initiated in December 2013 using the dose dense regimen of carboplatin and Taxol   She received 6 cycles of carboplatin and Taxol the  last administered on 02/06/2013. At that time her CA 125 is 5.6 units per mL.  Review of Systems:10 point review of systems is negative as noted above.   Vitals: Blood pressure 117/75, pulse 79, temperature  97.8 F (36.6 C), resp. rate 18, height 5\' 5"  (1.651 m), weight 147 lb (66.7 kg).  Physical Exam: General : The patient is a healthy woman in no acute distress.  HEENT: normocephalic, extraoccular movements normal; neck is supple without thyromegally  Lynphnodes: Supraclavicular and inguinal nodes not enlarged   Abdomen: Soft, non-tender, no ascites, no organomegally, no masses, no hernias  Pelvic:  EGBUS: Normal female  Vagina: Normal, no lesions no evidence of bleeding. Urethra and Bladder: Normal, non-tender  Cervix: Surgically absent  Uterus: Surgically absent   bi-manual examination:  there is some fullness on the left pelvic sidewall with associated tenderness. This is a new finding.  Rectal: normal sphincter tone, no masses, no blood  Lower extremities: No edema or varicosities. Normal range of motion      Allergies  Allergen Reactions  . Atorvastatin Other (See Comments)    Leg pain  . Macrodantin Other (See Comments)    Unknown Reaction  . Decongest-Aid [Pseudoephedrine] Palpitations    Past Medical History:  Diagnosis Date  . Anxiety and depression    . DJD (degenerative joint disease)   . Elevated cholesterol   . Endometrial polyp   . IRRITABLE BOWEL SYNDROME, HX OF 05/15/2008  . MVP (mitral valve prolapse)    occ palpitations   . Ovarian ca ([place]) 07-2012      . Rheumatoid arthritis(714.0) ~ 2010   dr Ouida Sills  . Shingles     Past Surgical History:  Procedure Laterality Date  . ABDOMINAL HYSTERECTOMY  08/22/2012   Procedure: HYSTERECTOMY ABDOMINAL;  Surgeon: Alvino Chapel, MD;  Location: [facility];  Service: Gynecology;  Laterality: N/A;  . BACK SURGERY  2008   Dr Trenton Gammon  . COLONOSCOPY  04-20-2004  . COLONOSCOPY W/ BIOPSIES  07/2014  . COLOSTOMY REVISION  08/22/2012   Procedure: COLON RESECTION SIGMOID;  Surgeon: Alvino Chapel, MD;  Location: [facility];  Service: Gynecology;;  Rectal Sigmoid resection and low rectal anastomosis  . CYST ON  NECK  2011  . DEBULKING  08/22/2012   Procedure: DEBULKING;  Surgeon: Alvino Chapel, MD;  Location: [facility];  Service: Gynecology;  Laterality: N/A;  Radical tumor debulking, Bilateral Ureterolysis  . DILATION AND CURETTAGE OF UTERUS  2004   WITH HYSTEROSCOPY  . HAND SURGERY  1996  . HYSTEROSCOPY  2004   D&C  . NECK SURGERY  2009   SPURS  . OMENTECTOMY  08/22/2012   Procedure: OMENTECTOMY;  Surgeon: Alvino Chapel, MD;  Location: [facility];  Service: Gynecology;  Laterality: N/A;  . SALPINGOOPHORECTOMY  08/22/2012   Procedure: SALPINGO OOPHORECTOMY;  Surgeon: Alvino Chapel, MD;  Location: [facility];  Service: Gynecology;  Laterality: Bilateral;  . TUBAL LIGATION  1980    Current Outpatient Prescriptions  Medication Sig Dispense Refill  . docusate sodium (COLACE) 100 MG capsule Take 100 mg by mouth daily as needed.     [name] escitalopram (LEXAPRO) 20 MG tablet Take 10 mg by mouth daily.     . fish oil-omega-3 fatty acids 1000 MG capsule Take 1 g by mouth daily.      . Flaxseed, Linseed, (FLAX SEED OIL) 1000 MG CAPS Take 1 capsule by mouth daily. Reported on 02/20/2016    . hydrOXYzine (ATARAX/VISTARIL) 10  MG tablet Take 10 mg by mouth 3 (three) times daily as needed.    . Melatonin 3 MG TABS Take 1 tablet by mouth at bedtime. Reported on 02/20/2016    . Multiple Vitamin (MULTIVITAMIN WITH MINERALS) TABS Take 1 tablet by mouth daily.    [name] omega-3 acid ethyl esters (LOVAZA) 1 g capsule Take by mouth.    . pravastatin (PRAVACHOL) 40 MG tablet Take by mouth.    . Vitamin D, Ergocalciferol, (DRISDOL) 50000 units CAPS capsule Take by mouth.    . Vitamins/Minerals TABS Take by mouth.     No current facility-administered medications for this visit.    Facility-Administered Medications Ordered in Other Visits  Medication Dose Route Frequency Provider Last Rate Last Dose  . sodium chloride 0.9 % injection 10 mL  10 mL Intracatheter PRN Ladell Pier, MD   10 mL at 01/16/13  1359    Social History   Social History  . Marital status: Married    Spouse name: N/A  . Number of children: 2  . Years of education: N/A   Occupational History  . disability d/t RA    Social History Main Topics  . Smoking status: Never Smoker  . Smokeless tobacco: Never Used  . Alcohol use No  . Drug use: No  . Sexual activity: No   Other Topics Concern  . Not on file   Social History Narrative   [name] is her daughter    Household-- pr and husband    Family History  Problem Relation Age of Onset  . Heart disease Mother   . Bladder Cancer Father 38  . Lymphoma Paternal Aunt   . Osteoporosis Sister   . Colon cancer Neg Hx   . Rectal cancer Neg Hx   . Stomach cancer Neg Hx   . Diabetes Neg Hx   . Stroke Neg Hx       Marti Sleigh, MD 08/23/2016, 1:56 PM

## 2016-08-25 ENCOUNTER — Encounter (HOSPITAL_COMMUNITY): Payer: Self-pay

## 2016-08-25 ENCOUNTER — Ambulatory Visit (HOSPITAL_COMMUNITY)
Admission: RE | Admit: 2016-08-25 | Discharge: 2016-08-25 | Disposition: A | Discharge status: Home / Self Care | Payer: Medicare Other | Source: Ambulatory Visit | Attending: Gynecologic Oncology | Admitting: Gynecologic Oncology

## 2016-08-25 ENCOUNTER — Other Ambulatory Visit (HOSPITAL_BASED_OUTPATIENT_CLINIC_OR_DEPARTMENT_OTHER): Payer: Medicare Other

## 2016-08-25 DIAGNOSIS — C569 Malignant neoplasm of unspecified ovary: Secondary | ICD-10-CM | POA: Diagnosis not present

## 2016-08-25 DIAGNOSIS — I700 Atherosclerosis of aorta: Secondary | ICD-10-CM | POA: Diagnosis not present

## 2016-08-25 DIAGNOSIS — R109 Unspecified abdominal pain: Secondary | ICD-10-CM | POA: Diagnosis not present

## 2016-08-25 DIAGNOSIS — R590 Localized enlarged lymph nodes: Secondary | ICD-10-CM | POA: Diagnosis not present

## 2016-08-25 LAB — BASIC METABOLIC PANEL
ANION GAP: 11 meq/L (ref 3–11)
BUN: 14.9 mg/dL (ref 7.0–26.0)
CALCIUM: 9.5 mg/dL (ref 8.4–10.4)
CO2: 25 mEq/L (ref 22–29)
Chloride: 104 mEq/L (ref 98–109)
Creatinine: 0.8 mg/dL (ref 0.6–1.1)
EGFR: 81 mL/min/{1.73_m2} — AB (ref >90)
Glucose: 90 mg/dl (ref 70–140)
POTASSIUM: 4.2 meq/L (ref 3.5–5.1)
Sodium: 140 mEq/L (ref 136–145)

## 2016-08-25 LAB — CANCER ANTIGEN 125 (PARALLEL TESTING): CA 125: 8 U/mL (ref <35)

## 2016-08-25 MED ORDER — IOPAMIDOL (ISOVUE-300) INJECTION 61%
100.0000 mL | Freq: Once | INTRAVENOUS | Status: AC | PRN
  Administered 2016-08-25: 100 mL via INTRAVENOUS

## 2016-08-25 MED ORDER — IOPAMIDOL (ISOVUE-300) INJECTION 61%
INTRAVENOUS | Status: AC
  Filled 2016-08-25: qty 100

## 2016-09-03 ENCOUNTER — Other Ambulatory Visit: Payer: Self-pay | Admitting: Gynecologic Oncology

## 2016-09-03 DIAGNOSIS — R590 Localized enlarged lymph nodes: Secondary | ICD-10-CM

## 2016-09-06 ENCOUNTER — Other Ambulatory Visit: Payer: Self-pay | Admitting: General Surgery

## 2016-09-07 ENCOUNTER — Ambulatory Visit (HOSPITAL_COMMUNITY)
Admission: RE | Admit: 2016-09-07 | Discharge: 2016-09-07 | Disposition: A | Discharge status: Home / Self Care | Payer: Medicare Other | Source: Ambulatory Visit | Attending: Gynecologic Oncology | Admitting: Gynecologic Oncology

## 2016-09-07 ENCOUNTER — Encounter (HOSPITAL_COMMUNITY): Payer: Self-pay

## 2016-09-07 DIAGNOSIS — Z8543 Personal history of malignant neoplasm of ovary: Secondary | ICD-10-CM | POA: Insufficient documentation

## 2016-09-07 DIAGNOSIS — R590 Localized enlarged lymph nodes: Secondary | ICD-10-CM | POA: Diagnosis not present

## 2016-09-07 DIAGNOSIS — C480 Malignant neoplasm of retroperitoneum: Secondary | ICD-10-CM | POA: Diagnosis not present

## 2016-09-07 DIAGNOSIS — Z79899 Other long term (current) drug therapy: Secondary | ICD-10-CM | POA: Insufficient documentation

## 2016-09-07 DIAGNOSIS — R1903 Right lower quadrant abdominal swelling, mass and lump: Secondary | ICD-10-CM | POA: Diagnosis not present

## 2016-09-07 LAB — CBC
HEMATOCRIT: 45.3 % (ref 36.0–46.0)
HEMOGLOBIN: 15.2 g/dL — AB (ref 12.0–15.0)
MCH: 32.7 pg (ref 26.0–34.0)
MCHC: 33.6 g/dL (ref 30.0–36.0)
MCV: 97.4 fL (ref 78.0–100.0)
Platelets: 251 10*3/uL (ref 150–400)
RBC: 4.65 MIL/uL (ref 3.87–5.11)
RDW: 13.2 % (ref 11.5–15.5)
WBC: 5.6 10*3/uL (ref 4.0–10.5)

## 2016-09-07 LAB — PROTIME-INR
INR: 0.93
Prothrombin Time: 12.5 seconds (ref 11.4–15.2)

## 2016-09-07 LAB — APTT: APTT: 25 s (ref 24–36)

## 2016-09-07 MED ORDER — SODIUM CHLORIDE 0.9 % IV SOLN
INTRAVENOUS | Status: DC
  Administered 2016-09-07: 08:00:00 via INTRAVENOUS

## 2016-09-07 MED ORDER — FENTANYL CITRATE (PF) 100 MCG/2ML IJ SOLN
INTRAMUSCULAR | Status: AC
  Filled 2016-09-07: qty 4

## 2016-09-07 MED ORDER — FENTANYL CITRATE (PF) 100 MCG/2ML IJ SOLN
INTRAMUSCULAR | Status: DC | PRN
  Administered 2016-09-07: 50 ug via INTRAVENOUS

## 2016-09-07 MED ORDER — MIDAZOLAM HCL 2 MG/2ML IJ SOLN
INTRAMUSCULAR | Status: DC | PRN
  Administered 2016-09-07: 1 mg via INTRAVENOUS

## 2016-09-07 MED ORDER — MIDAZOLAM HCL 2 MG/2ML IJ SOLN
INTRAMUSCULAR | Status: AC
  Filled 2016-09-07: qty 6

## 2016-09-07 NOTE — Discharge Instructions (Addendum)
Needle Biopsy, Care After °Introduction °These instructions give you information about caring for yourself after your procedure. Your doctor may also give you more specific instructions. Call your doctor if you have any problems or questions after your procedure. °Follow these instructions at home: °· Rest as told by your doctor. °· Take medicines only as told by your doctor. °· There are many different ways to close and cover the biopsy site, including stitches (sutures), skin glue, and adhesive strips. Follow instructions from your doctor about: °¨ How to take care of your biopsy site. °¨ When and how you should change your bandage (dressing). °¨ When you should remove your dressing. °¨ Removing whatever was used to close your biopsy site. °· Check your biopsy site every day for signs of infection. Watch for: °¨ Redness, swelling, or pain. °¨ Fluid, blood, or pus. °Contact a doctor if: °· You have a fever. °· You have redness, swelling, or pain at the biopsy site, and it lasts longer than a few days. °· You have fluid, blood, or pus coming from the biopsy site. °· You feel sick to your stomach (nauseous). °· You throw up (vomit). °Get help right away if: °· You are short of breath. °· You have trouble breathing. °· Your chest hurts. °· You feel dizzy or you pass out (faint). °· You have bleeding that does not stop with pressure or a bandage. °· You cough up blood. °· Your belly (abdomen) hurts. °This information is not intended to replace advice given to you by your health care provider. Make sure you discuss any questions you have with your health care provider. °Document Released: 09/02/2008 Document Revised: 02/26/2016 Document Reviewed: 09/16/2014 °© 2017 Elsevier °Moderate Conscious Sedation, Adult, Care After °These instructions provide you with information about caring for yourself after your procedure. Your health care provider may also give you more specific instructions. Your treatment has been planned  according to current medical practices, but problems sometimes occur. Call your health care provider if you have any problems or questions after your procedure. °What can I expect after the procedure? °After your procedure, it is common: °· To feel sleepy for several hours. °· To feel clumsy and have poor balance for several hours. °· To have poor judgment for several hours. °· To vomit if you eat too soon. °Follow these instructions at home: °For at least 24 hours after the procedure:  °· Do not: °¨ Participate in activities where you could fall or become injured. °¨ Drive. °¨ Use heavy machinery. °¨ Drink alcohol. °¨ Take sleeping pills or medicines that cause drowsiness. °¨ Make important decisions or sign legal documents. °¨ Take care of children on your own. °· Rest. °Eating and drinking °· Follow the diet recommended by your health care provider. °· If you vomit: °¨ Drink water, juice, or soup when you can drink without vomiting. °¨ Make sure you have little or no nausea before eating solid foods. °General instructions °· Have a responsible adult stay with you until you are awake and alert. °· Take over-the-counter and prescription medicines only as told by your health care provider. °· If you smoke, do not smoke without supervision. °· Keep all follow-up visits as told by your health care provider. This is important. °Contact a health care provider if: °· You keep feeling nauseous or you keep vomiting. °· You feel light-headed. °· You develop a rash. °· You have a fever. °Get help right away if: °· You have trouble breathing. °This information   is not intended to replace advice given to you by your health care provider. Make sure you discuss any questions you have with your health care provider. °Document Released: 07/11/2013 Document Revised: 02/23/2016 Document Reviewed: 01/10/2016 °Elsevier Interactive Patient Education © 2017 [employer]vier Inc. ° °

## 2016-09-07 NOTE — Procedures (Signed)
Interventional Radiology Procedure Note  Procedure: CT guided abd mass biopsy. 6x 18g core into saline  Complications: None Recommendations:  - Ok to shower tomorrow - Do not submerge for 7 days - Routine care - follow up path - 1 hour obs   Signed,  Dulcy Fanny. Earleen Newport, DO

## 2016-09-07 NOTE — H&P (Signed)
Chief Complaint: Patient was seen in consultation today for No chief complaint on file.  at the request of Cross,Melissa D  Referring Physician(s): Joylene John D  Supervising Physician: Corrie Mckusick  Patient Status: [facility] - Out-pt  History of Present Illness: [name] [name] is a 65 y.o. female with hx of ovarian cancer several years ago. She now has tender (R)iliac adenopathy and is referred for for biopsy to rule out recurrence. PMHx, meds, labs, imaging reviewed. Has been NPO this am. Husband at bedside. Feels well otherwise.  Past Medical History:  Diagnosis Date  . Anxiety and depression    . DJD (degenerative joint disease)   . Elevated cholesterol   . Endometrial polyp   . IRRITABLE BOWEL SYNDROME, HX OF 05/15/2008  . MVP (mitral valve prolapse)    occ palpitations   . Ovarian ca ([place]) 07-2012      . Rheumatoid arthritis(714.0) ~ 2010   dr Ouida Sills  . Shingles     Past Surgical History:  Procedure Laterality Date  . ABDOMINAL HYSTERECTOMY  08/22/2012   Procedure: HYSTERECTOMY ABDOMINAL;  Surgeon: Alvino Chapel, MD;  Location: [facility] ORS;  Service: Gynecology;  Laterality: N/A;  . BACK SURGERY  2008   Dr Trenton Gammon  . COLONOSCOPY  04-20-2004  . COLONOSCOPY W/ BIOPSIES  07/2014  . COLOSTOMY REVISION  08/22/2012   Procedure: COLON RESECTION SIGMOID;  Surgeon: Alvino Chapel, MD;  Location: [facility];  Service: Gynecology;;  Rectal Sigmoid resection and low rectal anastomosis  . CYST ON NECK  2011  . DEBULKING  08/22/2012   Procedure: DEBULKING;  Surgeon: Alvino Chapel, MD;  Location: [facility];  Service: Gynecology;  Laterality: N/A;  Radical tumor debulking, Bilateral Ureterolysis  . DILATION AND CURETTAGE OF UTERUS  2004   WITH HYSTEROSCOPY  . HAND SURGERY  1996  . HYSTEROSCOPY  2004   D&C  . NECK SURGERY  2009   SPURS  . OMENTECTOMY  08/22/2012   Procedure: OMENTECTOMY;  Surgeon: Alvino Chapel, MD;  Location: [facility];  Service:  Gynecology;  Laterality: N/A;  . SALPINGOOPHORECTOMY  08/22/2012   Procedure: SALPINGO OOPHORECTOMY;  Surgeon: Alvino Chapel, MD;  Location: [facility];  Service: Gynecology;  Laterality: Bilateral;  . TUBAL LIGATION  1980    Allergies: Atorvastatin; Macrodantin; and Decongest-aid [pseudoephedrine]  Medications: Prior to Admission medications   Medication Sig Start Date End Date Taking? Authorizing Provider  docusate sodium (COLACE) 100 MG capsule Take 100 mg by mouth daily as needed.  01/02/13  Yes Historical Provider, MD  escitalopram (LEXAPRO) 20 MG tablet Take 10 mg by mouth daily.    Yes Historical Provider, MD  fish oil-omega-3 fatty acids 1000 MG capsule Take 1 g by mouth daily.     Yes Historical Provider, MD  hydrOXYzine (ATARAX/VISTARIL) 10 MG tablet Take 10 mg by mouth 3 (three) times daily as needed.   Yes Historical Provider, MD  Multiple Vitamin (MULTIVITAMIN WITH MINERALS) TABS Take 1 tablet by mouth daily.   Yes Historical Provider, MD  omega-3 acid ethyl esters (LOVAZA) 1 g capsule Take by mouth.   Yes Historical Provider, MD  polyethylene glycol (MIRALAX / GLYCOLAX) packet Take 17 g by mouth daily.   Yes Historical Provider, MD  pravastatin (PRAVACHOL) 40 MG tablet Take by mouth. 03/08/16  Yes Historical Provider, MD  Vitamins/Minerals TABS Take by mouth.   Yes Historical Provider, MD  Flaxseed, Linseed, (FLAX SEED OIL) 1000 MG CAPS Take 1 capsule by mouth  daily. Reported on 02/20/2016    Historical Provider, MD  Melatonin 3 MG TABS Take 1 tablet by mouth at bedtime. Reported on 02/20/2016    Historical Provider, MD  Vitamin D, Ergocalciferol, (DRISDOL) 50000 units CAPS capsule Take by mouth. 03/10/16   Historical Provider, MD     Family History  Problem Relation Age of Onset  . Heart disease Mother   . Bladder Cancer Father 54  . Lymphoma Paternal Aunt   . Osteoporosis Sister   . Colon cancer Neg Hx   . Rectal cancer Neg Hx   . Stomach cancer Neg Hx   . Diabetes  Neg Hx   . Stroke Neg Hx     Social History   Social History  . Marital status: Married    Spouse name: N/A  . Number of children: 2  . Years of education: N/A   Occupational History  . disability d/t RA    Social History Main Topics  . Smoking status: Never Smoker  . Smokeless tobacco: Never Used  . Alcohol use No  . Drug use: No  . Sexual activity: No   Other Topics Concern  . None   Social History Narrative   [name] is her daughter    Household-- pr and husband     Review of Systems: A 12 point ROS discussed and pertinent positives are indicated in the HPI above.  All other systems are negative.  Review of Systems  Vital Signs: BP 123/77 (BP Location: Left Arm)   Pulse 70   Temp 97.6 F (36.4 C) (Oral)   Resp 16   SpO2 99%   Physical Exam  Constitutional: She is oriented to person, place, and time. She appears well-developed and well-nourished. No distress.  HENT:  Head: Normocephalic.  Mouth/Throat: Oropharynx is clear and moist.  Neck: Normal range of motion. No tracheal deviation present.  Cardiovascular: Normal rate, regular rhythm and normal heart sounds.   Pulmonary/Chest: Effort normal and breath sounds normal. No respiratory distress. She has no wheezes.  Neurological: She is alert and oriented to person, place, and time.  Psychiatric: She has a normal mood and affect. Judgment normal.     Mallampati Score:  MD Evaluation Airway: WNL Heart: WNL Abdomen: WNL Chest/ Lungs: WNL ASA  Classification: 2 Mallampati/Airway Score: One  Imaging: Ct Abdomen Pelvis W Contrast  Result Date: 08/25/2016 CLINICAL DATA:  65 year old female with a history of ovarian cancer diagnosed in 2013, status post TAHBSO, omentectomy, debulking and sigmoid colon resection with low rectal anastomosis 08/22/2012, presenting for restaging with symptoms of bloating, abdominal discomfort and change in bowel habits. EXAM: CT ABDOMEN AND PELVIS WITH CONTRAST  TECHNIQUE: Multidetector CT imaging of the abdomen and pelvis was performed using the standard protocol following bolus administration of intravenous contrast. CONTRAST:  131mL ISOVUE-300 IOPAMIDOL (ISOVUE-300) INJECTION 61% COMPARISON:  03/29/2013 CT chest, abdomen and pelvis. FINDINGS: Lower chest: No significant pulmonary nodules or acute consolidative airspace disease. Hepatobiliary: Normal liver size. Hypodense 0.4 cm inferior right liver lobe lesion (series 2/ image 27), too small to characterize, stable since 03/29/2013, considered benign. No new liver lesions. Normal gallbladder with no radiopaque cholelithiasis. No biliary ductal dilatation. Pancreas: Normal, with no mass or duct dilation. Spleen: Normal size. No mass. Adrenals/Urinary Tract: Normal adrenals. No hydronephrosis. Hypodense 0.7 cm renal cortical lesion in the upper left kidney, too small to characterize, stable since 03/29/2013, consistent with a benign renal cyst. No new renal lesions. Collapsed and grossly normal bladder. Stomach/Bowel:  Grossly normal stomach. Normal caliber small bowel with no small bowel wall thickening. Normal appendix. Stable postsurgical changes from partial distal colectomy with colorectal anastomosis. No large bowel or anastomotic wall thickening or pericolonic fat stranding. Oral contrast reaches the distal rectum. Vascular/Lymphatic: Atherosclerotic nonaneurysmal abdominal aorta. Patent portal, splenic, hepatic and renal veins. New enlarged 2.2 cm short axis lymph node anterior to the right external iliac artery (series 2/ image 69). No additional pathologically enlarged lymph nodes in the abdomen or pelvis. Reproductive: Status post hysterectomy, with no abnormal findings at the vaginal cuff. No adnexal mass. Other: No pneumoperitoneum, ascites or focal fluid collection. No peritoneal nodularity. Musculoskeletal: No aggressive appearing focal osseous lesions. Stable healed deformities in the posterior left tenth  and eleventh ribs. Marked lumbar spondylosis. IMPRESSION: 1. Solitary new enlarged right external iliac lymph node, suspicious for metastatic nodal recurrence. 2. No additional sites of metastatic disease in the abdomen or pelvis. 3. Aortic atherosclerosis. Electronically Signed   By: Ilona Sorrel M.D.   On: 08/25/2016 14:01    Labs:  CBC:  Recent Labs  03/08/16 1509 09/07/16 0716  WBC 6.3 5.6  HGB 14.5 15.2*  HCT 43.4 45.3  PLT 246.0 251    COAGS:  Recent Labs  09/07/16 0716  INR 0.93  APTT 25    BMP:  Recent Labs  03/08/16 1509 08/25/16 0901  NA 139 140  K 4.2 4.2  CL 103  --   CO2 30 25  GLUCOSE 86 90  BUN 19 14.9  CALCIUM 9.6 9.5  CREATININE 0.72 0.8    LIVER FUNCTION TESTS:  Recent Labs  03/08/16 1509  BILITOT 0.7  AST 21  ALT 27  ALKPHOS 66  PROT 7.0  ALBUMIN 4.3    TUMOR MARKERS: No results for input(s): AFPTM, CEA, CA199, CHROMGRNA in the last 8760 hours.  Assessment and Plan: Hx ovarian cancer New (R)iliac adenopathy/mass For CT guided biopsy Labs ok Risks and Benefits discussed with the patient including, but not limited to bleeding, infection, damage to adjacent structures or low yield requiring additional tests. All of the patient's questions were answered, patient is agreeable to proceed. Consent signed and in chart.    Thank you for this interesting consult.  I greatly enjoyed meeting DORTHEA ALANA and look forward to participating in their care.  A copy of this report was sent to the requesting provider on this date.  Electronically Signed: Ascencion Dike 09/07/2016, 8:58 AM   I spent a total of 20 minutes in face to face in clinical consultation, greater than 50% of which was counseling/coordinating care for biopsy of (R)iliac adenopathy

## 2016-09-09 ENCOUNTER — Telehealth: Payer: Self-pay | Admitting: Oncology

## 2016-09-09 NOTE — Telephone Encounter (Signed)
Confirmed 12/8 appt w/ pt per LOS

## 2016-09-10 ENCOUNTER — Other Ambulatory Visit: Payer: Self-pay | Admitting: Gynecologic Oncology

## 2016-09-10 ENCOUNTER — Ambulatory Visit (HOSPITAL_BASED_OUTPATIENT_CLINIC_OR_DEPARTMENT_OTHER): Payer: Medicare Other | Admitting: Nurse Practitioner

## 2016-09-10 VITALS — BP 117/76 | HR 98 | Temp 98.7°F | Resp 18 | Ht 65.0 in | Wt 145.5 lb

## 2016-09-10 DIAGNOSIS — C569 Malignant neoplasm of unspecified ovary: Secondary | ICD-10-CM

## 2016-09-10 DIAGNOSIS — C778 Secondary and unspecified malignant neoplasm of lymph nodes of multiple regions: Secondary | ICD-10-CM

## 2016-09-10 DIAGNOSIS — C775 Secondary and unspecified malignant neoplasm of intrapelvic lymph nodes: Secondary | ICD-10-CM

## 2016-09-10 MED ORDER — HYDROCODONE-ACETAMINOPHEN 5-325 MG PO TABS: 0.5000 | ORAL_TABLET | Freq: Four times a day (QID) | ORAL | 0 refills | Status: DC | PRN

## 2016-09-10 NOTE — Progress Notes (Addendum)
[facility] OFFICE PROGRESS NOTE   Diagnosis:  Ovarian cancer  INTERVAL HISTORY:   [name] [name] was last seen at the [facility] in September 2016. CT scan abdomen/pelvis on 08/25/2016 showed a solitary new enlarged right external iliac lymph node. Biopsy of the lymph node on 09/07/2016 showed adenocarcinoma consistent with high-grade serous carcinoma.  She reports an approximate one-month history of pain at the right lower abdomen. The pain is there fairly consistently. She has also noted intermittent constipation over the past month. She is taking a stool softener on a daily basis and Miralax every 3-4 days.  Objective:  Vital signs in last 24 hours:  Blood pressure 117/76, pulse 98, temperature 98.7 F (37.1 C), temperature source Oral, resp. rate 18, height 5\' 5"  (1.651 m), weight 145 lb 8 oz (66 kg), SpO2 98 %.    HEENT: No thrush or ulcers. Lymphatics: No palpable cervical, supraclavicular, axillary or inguinal lymph nodes. Resp: Lungs clear bilaterally. Cardio: Regular rate and rhythm. GI: Abdomen soft. No hepatomegaly. No mass. No apparent ascites. Tender at the right low abdomen. Vascular: No leg edema.    Lab Results:  Lab Results  Component Value Date   WBC 5.6 09/07/2016   HGB 15.2 (H) 09/07/2016   HCT 45.3 09/07/2016   MCV 97.4 09/07/2016   PLT 251 09/07/2016   NEUTROABS 4.1 10/18/2014    Imaging:  No results found.  Medications: I have reviewed the patient's current medications.  Assessment/Plan: 1. 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 A 5 mm nodules remain on the right diaphragm. -  Cycle 1 of adjuvant Taxol/carboplatin chemotherapy initiated on 09/19/2012.   The CA 125 normalized.   She completed day 15 of cycle 6 on 02/06/2013.   Restaging CT evaluation 03/29/2013 showed  no evidence of metastatic disease in the chest. There was marked improvement in appearance/resolution of previous described peritoneal/omental disease. There was no convincing evidence of residual disease. There was minimal increased density in the region of the omentum favored to be treatment-related. There was no pelvic adenopathy.  CA125 3.2 on 02/19/2014.  08/18/2016 CA-125 8  CT abdomen/pelvis 08/25/2016-solitary new enlarged right external iliac lymph node measuring 2.2 cm.  Biopsy 09/07/2016-adenocarcinoma consistent with high-grade serous carcinoma. 2. Low abdomen/suprapubic pain prior to the exploratory laparotomy-likely secondary to omental/pelvic tumor; persistent mild pain in the lower abdomen  3. Chronic neck and back pain.  4. Anxiety -persistent despite Lexapro and Xanax. She has been evaluated by psychiatry. Currently on Lexapro. 5. Status post Port-A-Cath placement 09/22/2012. The Port-A-Cath was removed on 04/03/2013.  6. Neutropenia secondary to chemotherapy- day 15 cycle 1 and cycle 3. Taxol/carboplatin not given secondary to neutropenia.  7. Herpes zoster involving a right thoracic dermatome July 2015. She completed a course of Valtrex. 8. Nodular bony prominence at the left pelvis on rectal exam 06/05/2015-likely a benign finding   Disposition: [name] [name] appears stable. She was found to have a solitary enlarged right external iliac lymph node on CT 08/25/2016. Biopsy confirmed recurrent ovarian cancer. She has been referred for a staging PET scan. If no additional disease is identified on PET the plan is to refer [name] for radiation.  For pain she was provided with a prescription for hydrocodone 5/325 one half to one tablet every 6 hours as needed. She understands she should not be driving while taking pain medication.  We will schedule a return  visit pending the PET scan result.  Patient seen with Dr. Benay Spice. CT images reviewed on the computer with [name] [name] and  [name] [name]. 30 minutes were spent face-to-face at today's visit with the majority of that time involved in counseling/coordination of care.    Ned Card ANP/GNP-BC   09/10/2016  3:43 PM This was a shared visit with Ned Card. [name] [name] was interviewed and examined. We reviewed the CT images and discussed the biopsy findings with [name] [name] [name]. We reviewed treatment options. She will be referred for a staging PET scan. We will then decide on systemic therapy versus radiation. We will coordinate a treatment plan with GYN oncology.  Julieanne Manson, M.D.

## 2016-09-18 ENCOUNTER — Other Ambulatory Visit: Payer: Self-pay | Admitting: Nurse Practitioner

## 2016-09-20 DIAGNOSIS — F411 Generalized anxiety disorder: Secondary | ICD-10-CM | POA: Diagnosis not present

## 2016-09-22 ENCOUNTER — Ambulatory Visit (HOSPITAL_COMMUNITY)
Admission: RE | Admit: 2016-09-22 | Discharge: 2016-09-22 | Disposition: A | Discharge status: Home / Self Care | Payer: Medicare Other | Source: Ambulatory Visit | Attending: Gynecologic Oncology | Admitting: Gynecologic Oncology

## 2016-09-22 DIAGNOSIS — C775 Secondary and unspecified malignant neoplasm of intrapelvic lymph nodes: Secondary | ICD-10-CM | POA: Diagnosis not present

## 2016-09-22 DIAGNOSIS — I700 Atherosclerosis of aorta: Secondary | ICD-10-CM | POA: Diagnosis not present

## 2016-09-22 DIAGNOSIS — C569 Malignant neoplasm of unspecified ovary: Secondary | ICD-10-CM

## 2016-09-22 LAB — GLUCOSE, CAPILLARY: Glucose-Capillary: 93 mg/dL (ref 65–99)

## 2016-09-22 MED ORDER — FLUDEOXYGLUCOSE F - 18 (FDG) INJECTION: 7.2000 | Freq: Once | INTRAVENOUS | Status: DC | PRN

## 2016-09-23 ENCOUNTER — Encounter: Payer: Self-pay | Admitting: Internal Medicine

## 2016-09-23 ENCOUNTER — Telehealth: Payer: Self-pay | Admitting: Gynecologic Oncology

## 2016-09-23 ENCOUNTER — Ambulatory Visit (INDEPENDENT_AMBULATORY_CARE_PROVIDER_SITE_OTHER): Payer: Medicare Other | Admitting: Internal Medicine

## 2016-09-23 VITALS — BP 116/80 | HR 79 | Temp 97.8°F | Wt 145.0 lb

## 2016-09-23 DIAGNOSIS — E559 Vitamin D deficiency, unspecified: Secondary | ICD-10-CM | POA: Diagnosis not present

## 2016-09-23 DIAGNOSIS — C569 Malignant neoplasm of unspecified ovary: Secondary | ICD-10-CM

## 2016-09-23 DIAGNOSIS — F419 Anxiety disorder, unspecified: Secondary | ICD-10-CM

## 2016-09-23 DIAGNOSIS — F329 Major depressive disorder, single episode, unspecified: Secondary | ICD-10-CM

## 2016-09-23 DIAGNOSIS — F418 Other specified anxiety disorders: Secondary | ICD-10-CM

## 2016-09-23 DIAGNOSIS — M069 Rheumatoid arthritis, unspecified: Secondary | ICD-10-CM | POA: Diagnosis not present

## 2016-09-23 DIAGNOSIS — E7800 Pure hypercholesterolemia, unspecified: Secondary | ICD-10-CM | POA: Diagnosis not present

## 2016-09-23 DIAGNOSIS — F32A Depression, unspecified: Secondary | ICD-10-CM

## 2016-09-23 LAB — COMPREHENSIVE METABOLIC PANEL
ALBUMIN: 4.2 g/dL (ref 3.5–5.2)
ALT: 26 U/L (ref 0–35)
AST: 23 U/L (ref 0–37)
Alkaline Phosphatase: 51 U/L (ref 39–117)
BUN: 17 mg/dL (ref 6–23)
CHLORIDE: 106 meq/L (ref 96–112)
CO2: 30 meq/L (ref 19–32)
CREATININE: 0.81 mg/dL (ref 0.40–1.20)
Calcium: 9.2 mg/dL (ref 8.4–10.5)
GFR: 75 mL/min (ref >60.00)
Glucose, Bld: 91 mg/dL (ref 70–99)
Potassium: 3.8 mEq/L (ref 3.5–5.1)
SODIUM: 142 meq/L (ref 135–145)
Total Bilirubin: 0.6 mg/dL (ref 0.2–1.2)
Total Protein: 6.8 g/dL (ref 6.0–8.3)

## 2016-09-23 LAB — LIPID PANEL
CHOL/HDL RATIO: 4
Cholesterol: 188 mg/dL (ref 0–200)
HDL: 49.5 mg/dL (ref >39.00)
LDL CALC: 117 mg/dL — AB (ref 0–99)
NONHDL: 138.73
Triglycerides: 109 mg/dL (ref 0.0–149.0)
VLDL: 21.8 mg/dL (ref 0.0–40.0)

## 2016-09-23 LAB — VITAMIN D 25 HYDROXY (VIT D DEFICIENCY, FRACTURES): VITD: 26.76 ng/mL — AB (ref 30.00–100.00)

## 2016-09-23 NOTE — Patient Instructions (Signed)
Fat and Cholesterol Restricted Diet Introduction Getting too much fat and cholesterol in your diet may cause health problems. Following this diet helps keep your fat and cholesterol at normal levels. This can keep you from getting sick. What types of fat should I choose?  Choose monosaturated and polyunsaturated fats. These are found in foods such as olive oil, canola oil, flaxseeds, walnuts, almonds, and seeds.  Eat more omega-3 fats. Good choices include salmon, mackerel, sardines, tuna, flaxseed oil, and ground flaxseeds.  Limit saturated fats. These are in animal products such as meats, butter, and cream. They can also be in plant products such as palm oil, palm kernel oil, and coconut oil.  Avoid foods with partially hydrogenated oils in them. These contain trans fats. Examples of foods that have trans fats are stick margarine, some tub margarines, cookies, crackers, and other baked goods. What general guidelines do I need to follow?  Check food labels. Look for the words "trans fat" and "saturated fat."  When preparing a meal:  Fill half of your plate with vegetables and green salads.  Fill one fourth of your plate with whole grains. Look for the word "whole" as the first word in the ingredient list.  Fill one fourth of your plate with lean protein foods.  Eat more foods that have fiber, like apples, carrots, beans, peas, and barley.  Eat more home-cooked foods. Eat less at restaurants and buffets.  Limit or avoid alcohol.  Limit foods high in starch and sugar.  Limit fried foods.  Cook foods without frying them. Baking, boiling, grilling, and broiling are all great options.  Lose weight if you are overweight. Losing even a small amount of weight can help your overall health. It can also help prevent diseases such as diabetes and heart disease. What foods can I eat? Grains  Whole grains, such as whole wheat or whole grain breads, crackers, cereals, and pasta. Unsweetened  oatmeal, bulgur, barley, quinoa, or brown rice. Corn or whole wheat flour tortillas. Vegetables  Fresh or frozen vegetables (raw, steamed, roasted, or grilled). Green salads. Fruits  All fresh, canned (in natural juice), or frozen fruits. Meat and Other Protein Products  Ground beef (85% or leaner), grass-fed beef, or beef trimmed of fat. Skinless chicken or turkey. Ground chicken or turkey. Pork trimmed of fat. All fish and seafood. Eggs. Dried beans, peas, or lentils. Unsalted nuts or seeds. Unsalted canned or dry beans. Dairy  Low-fat dairy products, such as skim or 1% milk, 2% or reduced-fat cheeses, low-fat ricotta or cottage cheese, or plain low-fat yogurt. Fats and Oils  Tub margarines without trans fats. Light or reduced-fat mayonnaise and salad dressings. Avocado. Olive, canola, sesame, or safflower oils. Natural peanut or almond butter (choose ones without added sugar and oil). The items listed above may not be a complete list of recommended foods or beverages. Contact your dietitian for more options.  What foods are not recommended? Grains  White bread. White pasta. White rice. Cornbread. Bagels, pastries, and croissants. Crackers that contain trans fat. Vegetables  White potatoes. Corn. Creamed or fried vegetables. Vegetables in a cheese sauce. Fruits  Dried fruits. Canned fruit in light or heavy syrup. Fruit juice. Meat and Other Protein Products  Fatty cuts of meat. Ribs, chicken wings, bacon, sausage, bologna, salami, chitterlings, fatback, hot dogs, bratwurst, and packaged luncheon meats. Liver and organ meats. Dairy  Whole or 2% milk, cream, half-and-half, and cream cheese. Whole milk cheeses. Whole-fat or sweetened yogurt. Full-fat cheeses. Nondairy creamers and   whipped toppings. Processed cheese, cheese spreads, or cheese curds. Sweets and Desserts  Corn syrup, sugars, honey, and molasses. Candy. Jam and jelly. Syrup. Sweetened cereals. Cookies, pies, cakes, donuts,  muffins, and ice cream. Fats and Oils  Butter, stick margarine, lard, shortening, ghee, or bacon fat. Coconut, palm kernel, or palm oils. Beverages  Alcohol. Sweetened drinks (such as sodas, lemonade, and fruit drinks or punches). The items listed above may not be a complete list of foods and beverages to avoid. Contact your dietitian for more information.  This information is not intended to replace advice given to you by your health care provider. Make sure you discuss any questions you have with your health care provider. Document Released: 03/21/2012 Document Revised: 05/27/2016 Document Reviewed: 12/20/2013  2017 Elsevier  

## 2016-09-23 NOTE — Assessment & Plan Note (Signed)
Returned in a lymph noted Awaiting to hear if she needs to undergo radiation therapy She will continue to follow with oncology

## 2016-09-23 NOTE — Assessment & Plan Note (Signed)
Ongoing Support offered today She is now following with psych Continue Lexapro and Hydroxyzine as prescribed

## 2016-09-23 NOTE — Assessment & Plan Note (Signed)
Recheck Vit D level today

## 2016-09-23 NOTE — Assessment & Plan Note (Signed)
Ongoing stiffness but no pain Encouraged her to stay active Continue Ibuprofen prn

## 2016-09-23 NOTE — Telephone Encounter (Signed)
Patient informed of PET scan results.  Advised to Dr. Mora Bellman recommendations to proceed with a consult with Radiation Oncology.  Will reach out to Dr. Sondra Come direct for sooner appt.  Patient verbalizing understanding.  Advised she would be contacted with an appt date and time.

## 2016-09-23 NOTE — Assessment & Plan Note (Signed)
Lipid profile and CMET today Encouraged her to consume a low fat diet Will see if she needs to be taking the full dose of her medications after labs are back

## 2016-09-23 NOTE — Progress Notes (Signed)
Subjective:    Patient ID: [name] [name], female    DOB: 28-Aug-1951, 65 y.o.   MRN: [id number]  HPI  Pt presents to the clinic today for 6 month follow up of chronic conditions.  History of ovarian cancer: She is following with Dr. Benay Spice. She is s/p debulking, and chemo. New nodule noted on repeat CT scan. Awaiting PET scan for staging. Will likely undergo radiation.  HLD: Her last LDL was 140, triglycerides 177, 03/2016. She is only taking 1/2 of her Pravastatin and Lovaza. She tries to consume a low fat diet.    RA: She only takes Ibuprofen OTC right now. She has more stiffness than pain. She no longer follows with Dr. Ouida Sills  Anxiety and Depression: Triggered by her relationship with her husband. She is taking Lexapro and Hydroxyzine as prescribed. She is now following with psychiatry.  Vit D deficiency: She is currently taking Vit D OTC. She would like her Vit D level checked today.  Review of Systems      Past Medical History:  Diagnosis Date  . Anxiety and depression    . DJD (degenerative joint disease)   . Elevated cholesterol   . Endometrial polyp   . IRRITABLE BOWEL SYNDROME, HX OF 05/15/2008  . MVP (mitral valve prolapse)    occ palpitations   . Ovarian ca ([place]) 07-2012      . Rheumatoid arthritis(714.0) ~ 2010   dr Ouida Sills  . Shingles     Current Outpatient Prescriptions  Medication Sig Dispense Refill  . docusate sodium (COLACE) 100 MG capsule Take 100 mg by mouth daily as needed.     [name] escitalopram (LEXAPRO) 20 MG tablet Take 10 mg by mouth daily.     . fish oil-omega-3 fatty acids 1000 MG capsule Take 1 g by mouth daily.      . Flaxseed, Linseed, (FLAX SEED OIL) 1000 MG CAPS Take 1 capsule by mouth daily. Reported on 02/20/2016    . HYDROcodone-acetaminophen (NORCO) 5-325 MG tablet Take 0.5-1 tablets by mouth every 6 (six) hours as needed for moderate pain. 30 tablet 0  . hydrOXYzine (ATARAX/VISTARIL) 10 MG tablet Take 10 mg by mouth 3 (three) times  daily as needed.    . Melatonin 3 MG TABS Take 1 tablet by mouth at bedtime. Reported on 02/20/2016    . Multiple Vitamin (MULTIVITAMIN WITH MINERALS) TABS Take 1 tablet by mouth daily.    [name] omega-3 acid ethyl esters (LOVAZA) 1 g capsule Take by mouth.    . polyethylene glycol (MIRALAX / GLYCOLAX) packet Take 17 g by mouth daily.    . pravastatin (PRAVACHOL) 40 MG tablet Take by mouth.    . Vitamin D, Ergocalciferol, (DRISDOL) 50000 units CAPS capsule Take by mouth.    . Vitamins/Minerals TABS Take by mouth.     No current facility-administered medications for this visit.    Facility-Administered Medications Ordered in Other Visits  Medication Dose Route Frequency Provider Last Rate Last Dose  . fludeoxyglucose F - 18 (FDG) injection 7.2 millicurie  7.2 millicurie Intravenous Once PRN Barnet Glasgow, MD      . sodium chloride 0.9 % injection 10 mL  10 mL Intracatheter PRN Ladell Pier, MD   10 mL at 01/16/13 1359    Allergies  Allergen Reactions  . Atorvastatin Other (See Comments)    Leg pain  . Macrodantin Other (See Comments)    Unknown Reaction  . Decongest-Aid [Pseudoephedrine] Palpitations    Family History  Problem Relation Age of Onset  . Heart disease Mother   . Bladder Cancer Father 52  . Lymphoma Paternal Aunt   . Osteoporosis Sister   . Colon cancer Neg Hx   . Rectal cancer Neg Hx   . Stomach cancer Neg Hx   . Diabetes Neg Hx   . Stroke Neg Hx     Social History   Social History  . Marital status: Married    Spouse name: N/A  . Number of children: 2  . Years of education: N/A   Occupational History  . disability d/t RA    Social History Main Topics  . Smoking status: Never Smoker  . Smokeless tobacco: Never Used  . Alcohol use No  . Drug use: No  . Sexual activity: No   Other Topics Concern  . Not on file   Social History Narrative   [name] is her daughter    Household-- pr and husband     Constitutional: Denies fever, malaise,  fatigue, headache or abrupt weight changes.  HEENT: Denies eye pain, eye redness, ear pain, ringing in the ears, wax buildup, runny nose, nasal congestion, bloody nose, or sore throat. Respiratory: Denies difficulty breathing, shortness of breath, cough or sputum production.   Cardiovascular: Denies chest pain, chest tightness, palpitations or swelling in the hands or feet.  Gastrointestinal: Denies abdominal pain, bloating, constipation, diarrhea or blood in the stool.  GU: Denies urgency, frequency, pain with urination, burning sensation, blood in urine, odor or discharge. Musculoskeletal: Pt reports joint stiffnessDenies decrease in range of motion, difficulty with gait, muscle pain or joint swelling.  Skin: Denies redness, rashes, lesions or ulcercations.  Neurological: Denies dizziness, difficulty with memory, difficulty with speech or problems with balance and coordination.  Psych: Pt reports anxiety and depression. Denies SI/HI.  No other specific complaints in a complete review of systems (except as listed in HPI above).  Objective:   Physical Exam   BP 116/80   Pulse 79   Temp 97.8 F (36.6 C) (Oral)   Wt 145 lb (65.8 kg)   SpO2 98%   BMI 24.13 kg/m  Wt Readings from Last 3 Encounters:  09/23/16 145 lb (65.8 kg)  09/10/16 145 lb 8 oz (66 kg)  08/23/16 147 lb (66.7 kg)    General: Appears her stated age, well developed, well nourished in NAD. Cardiovascular: Normal rate and rhythm. S1,S2 noted.  No murmur, rubs or gallops noted. No JVD or BLE edema. No carotid bruits noted. Pulmonary/Chest: Normal effort and positive vesicular breath sounds. No respiratory distress. No wheezes, rales or ronchi noted.  Musculoskeletal: No signs of joint swelling. Strength 5/5 BUE/BLE. No difficulty with gait.  Neurological: Alert and oriented.  Psychiatric: Mood and affect normal. Behavior is normal. Judgment and thought content normal.     BMET    Component Value Date/Time   NA 140  08/25/2016 0901   K 4.2 08/25/2016 0901   CL 103 03/08/2016 1509   CL 104 01/02/2013 0821   CO2 25 08/25/2016 0901   GLUCOSE 90 08/25/2016 0901   GLUCOSE 86 01/02/2013 0821   BUN 14.9 08/25/2016 0901   CREATININE 0.8 08/25/2016 0901   CALCIUM 9.5 08/25/2016 0901   GFRNONAA >90 08/23/2012 0340   GFRAA >90 08/23/2012 0340    Lipid Panel     Component Value Date/Time   CHOL 230 (H) 03/08/2016 1509   TRIG 177.0 (H) 03/08/2016 1509   HDL 53.90 03/08/2016 1509   CHOLHDL  4 03/08/2016 1509   VLDL 35.4 03/08/2016 1509   LDLCALC 140 (H) 03/08/2016 1509    CBC    Component Value Date/Time   WBC 5.6 09/07/2016 0716   RBC 4.65 09/07/2016 0716   HGB 15.2 (H) 09/07/2016 0716   HGB 12.8 03/26/2013 0842   HCT 45.3 09/07/2016 0716   HCT 37.4 03/26/2013 0842   PLT 251 09/07/2016 0716   PLT 194 03/26/2013 0842   MCV 97.4 09/07/2016 0716   MCV 111.1 (H) 03/26/2013 0842   MCH 32.7 09/07/2016 0716   MCHC 33.6 09/07/2016 0716   RDW 13.2 09/07/2016 0716   RDW 14.5 03/26/2013 0842   LYMPHSABS 1.5 10/18/2014 1024   LYMPHSABS 1.0 03/26/2013 0842   MONOABS 0.6 10/18/2014 1024   MONOABS 0.5 03/26/2013 0842   EOSABS 0.1 10/18/2014 1024   EOSABS 0.1 03/26/2013 0842   BASOSABS 0.0 10/18/2014 1024   BASOSABS 0.0 03/26/2013 0842    Hgb A1C No results found for: HGBA1C         Assessment & Plan:

## 2016-09-24 ENCOUNTER — Encounter: Payer: Self-pay | Admitting: Radiation Oncology

## 2016-09-24 ENCOUNTER — Other Ambulatory Visit: Payer: Self-pay | Admitting: Nurse Practitioner

## 2016-09-25 ENCOUNTER — Other Ambulatory Visit: Payer: Self-pay | Admitting: Nurse Practitioner

## 2016-09-28 ENCOUNTER — Encounter: Payer: Self-pay | Admitting: Internal Medicine

## 2016-10-01 NOTE — Progress Notes (Signed)
GYN Location of Tumor / Histology: Ovarian Cancer recurrence presenting with a new enlarged right external iliac lymph node  [name] [name] presented with symptoms of: one-month history of pain at the right lower abdomen  Biopsies revealed: Biopsy 09/07/2016-adenocarcinoma consistent with high-grade serous carcinoma.   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   Past/Anticipated interventions by medical oncology, if any: Taxol/carboplatin chemotherapy initiated on 09/19/2012. She completed day 15 of cycle 6 on 02/06/2013.  Weight changes, if any: no  Bowel/Bladder complaints, if any: reports having constipation at times.  Nausea/Vomiting, if any: no  Pain issues, if any:  Yes - has pain in her right lower abdomen.  SAFETY ISSUES:  Prior radiation? no  Pacemaker/ICD? no  Possible current pregnancy? no  Is the patient on methotrexate? no  CT scan abdomen/pelvis on 08/25/2016 showed a solitary new enlarged right external iliac lymph node. Biopsy of the lymph node on 09/07/2016 showed adenocarcinoma consistent with high-grade serous carcinoma.   BP 118/78 (BP Location: Right Arm, Patient Position: Sitting)   Pulse 84   Temp 98.2 F (36.8 C) (Oral)   Ht 5\' 5"  (1.651 m)   Wt 146 lb (66.2 kg)   SpO2 98%   BMI 24.30 kg/m    Wt Readings from Last 3 Encounters:  10/08/16 146 lb (66.2 kg)  09/23/16 145 lb (65.8 kg)  09/10/16 145 lb 8 oz (66 kg)

## 2016-10-08 ENCOUNTER — Encounter: Payer: Self-pay | Admitting: Radiation Oncology

## 2016-10-08 ENCOUNTER — Ambulatory Visit
Admission: RE | Admit: 2016-10-08 | Discharge: 2016-10-08 | Disposition: A | Discharge status: Home / Self Care | Payer: Medicare Other | Source: Ambulatory Visit | Attending: Radiation Oncology | Admitting: Radiation Oncology

## 2016-10-08 DIAGNOSIS — Z807 Family history of other malignant neoplasms of lymphoid, hematopoietic and related tissues: Secondary | ICD-10-CM | POA: Diagnosis not present

## 2016-10-08 DIAGNOSIS — Z888 Allergy status to other drugs, medicaments and biological substances status: Secondary | ICD-10-CM | POA: Insufficient documentation

## 2016-10-08 DIAGNOSIS — Z510 Encounter for antineoplastic radiation therapy: Secondary | ICD-10-CM | POA: Insufficient documentation

## 2016-10-08 DIAGNOSIS — I341 Nonrheumatic mitral (valve) prolapse: Secondary | ICD-10-CM | POA: Insufficient documentation

## 2016-10-08 DIAGNOSIS — Z90710 Acquired absence of both cervix and uterus: Secondary | ICD-10-CM | POA: Diagnosis not present

## 2016-10-08 DIAGNOSIS — M069 Rheumatoid arthritis, unspecified: Secondary | ICD-10-CM | POA: Diagnosis not present

## 2016-10-08 DIAGNOSIS — N840 Polyp of corpus uteri: Secondary | ICD-10-CM | POA: Diagnosis not present

## 2016-10-08 DIAGNOSIS — Z8052 Family history of malignant neoplasm of bladder: Secondary | ICD-10-CM | POA: Diagnosis not present

## 2016-10-08 DIAGNOSIS — Z8261 Family history of arthritis: Secondary | ICD-10-CM | POA: Insufficient documentation

## 2016-10-08 DIAGNOSIS — C569 Malignant neoplasm of unspecified ovary: Secondary | ICD-10-CM | POA: Diagnosis not present

## 2016-10-08 DIAGNOSIS — E7800 Pure hypercholesterolemia, unspecified: Secondary | ICD-10-CM | POA: Diagnosis not present

## 2016-10-08 DIAGNOSIS — K589 Irritable bowel syndrome without diarrhea: Secondary | ICD-10-CM | POA: Diagnosis not present

## 2016-10-08 DIAGNOSIS — Z8249 Family history of ischemic heart disease and other diseases of the circulatory system: Secondary | ICD-10-CM | POA: Insufficient documentation

## 2016-10-08 DIAGNOSIS — Z90722 Acquired absence of ovaries, bilateral: Secondary | ICD-10-CM | POA: Diagnosis not present

## 2016-10-08 DIAGNOSIS — C775 Secondary and unspecified malignant neoplasm of intrapelvic lymph nodes: Secondary | ICD-10-CM | POA: Diagnosis not present

## 2016-10-08 NOTE — Progress Notes (Signed)
Please see the Nurse Progress Note in the MD Initial Consult Encounter for this patient. 

## 2016-10-08 NOTE — Progress Notes (Signed)
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 female who has a history of stage IIIc high grade serous carcinoma of the ovary, status post an optimal debulking with a rectosigmoid resection, total omentectomy, hysterectomy/bilateral salpingo-oophorectomy on 08/22/12. She was placed on taxol/carboplatin chemotherapy on 09/19/12 and completed day 15 of cycle 6 on 02/06/13. She has done well concerning her ovarian cancer until recently  She now presents with a one month history of right lower abdominal pain. Patient also noticed some problems with constipation. She was seen by Dr. Fermin Schwab with exam unremarkable however given the patient's symptomatology an Abdomen CT on 08/25/16 revealed solitary new enlarged right external iliac lymph node suspicious for metastatic nodal recurrence. Biopsy on 09/07/16 of right lower quadrant abdominal mass revealed adenocarcinoma consistent with high grade serous carcinoma.  She is positive for occasional constipation, and pain in her right lower abdomen. She denies weight changes, or nausea/vomiting at this time. She denies any cough or breathing problems. The patient denies any new problems with headaches dizziness or blurred vision  PREVIOUS RADIATION THERAPY: No  PAST MEDICAL HISTORY:  has a past medical history of Anxiety and depression ( ); DJD (degenerative joint disease); Elevated cholesterol; Endometrial polyp;  IRRITABLE BOWEL SYNDROME, HX OF (05/15/2008); MVP (mitral valve prolapse); Ovarian ca ([facility]) (07-2012); Rheumatoid arthritis(714.0) (~ 2010); and Shingles.    PAST SURGICAL HISTORY: Past Surgical History:  Procedure Laterality Date  . ABDOMINAL HYSTERECTOMY  08/22/2012   Procedure: HYSTERECTOMY ABDOMINAL;  Surgeon: Alvino Chapel, MD;  Location: [facility];  Service: Gynecology;  Laterality: N/A;  . BACK SURGERY  2008   Dr Trenton Gammon  . COLONOSCOPY  04-20-2004  . COLONOSCOPY W/ BIOPSIES  07/2014  . COLOSTOMY REVISION  08/22/2012   Procedure: COLON RESECTION SIGMOID;  Surgeon: Alvino Chapel, MD;  Location: [facility];  Service: Gynecology;;  Rectal Sigmoid resection and low rectal anastomosis  . CYST ON NECK  2011  . DEBULKING  08/22/2012   Procedure: DEBULKING;  Surgeon: Alvino Chapel, MD;  Location: [facility];  Service: Gynecology;  Laterality: N/A;  Radical tumor debulking, Bilateral Ureterolysis  . DILATION AND CURETTAGE OF UTERUS  2004   WITH HYSTEROSCOPY  . HAND SURGERY  1996  . HYSTEROSCOPY  2004   D&C  . NECK SURGERY  2009   SPURS  . OMENTECTOMY  08/22/2012   Procedure: OMENTECTOMY;  Surgeon: Alvino Chapel, MD;  Location: [facility];  Service: Gynecology;  Laterality: N/A;  . SALPINGOOPHORECTOMY  08/22/2012   Procedure: SALPINGO OOPHORECTOMY;  Surgeon: Alvino Chapel, MD;  Location: [facility];  Service: Gynecology;  Laterality: Bilateral;  . TUBAL LIGATION  1980    FAMILY HISTORY: family history includes Bladder Cancer (age of onset: 49) in her father; Heart disease in her mother; Lymphoma in her paternal aunt; Osteoporosis in her sister.  SOCIAL HISTORY:  reports that she has never smoked. She has never used smokeless tobacco. She reports that she does not drink alcohol or use drugs.  ALLERGIES: Atorvastatin; Macrodantin; and Decongest-aid [pseudoephedrine]  MEDICATIONS:  Current Outpatient Prescriptions  Medication Sig Dispense Refill  . docusate  sodium (COLACE) 100 MG capsule Take 100 mg by mouth daily as needed.     . estazolam (PROSOM) 2 MG tablet Take 2 mg by mouth at bedtime.    . fish oil-omega-3 fatty acids 1000 MG capsule Take 1 g by mouth daily.      . hydrOXYzine (ATARAX/VISTARIL) 10 MG tablet Take 10 mg by mouth 3 (three) times daily as needed.    . Multiple Vitamin (MULTIVITAMIN WITH MINERALS) TABS Take 1 tablet by mouth daily.    . polyethylene glycol (MIRALAX / GLYCOLAX) packet Take 17 g by mouth daily.    . pravastatin (PRAVACHOL) 40 MG tablet Take by mouth.     No current facility-administered medications for this encounter.     REVIEW OF SYSTEMS:  A 12 point review of systems is documented in the electronic medical record. This was obtained by the nursing staff. However, I reviewed this with the patient to discuss relevant findings and make appropriate changes.     PHYSICAL EXAM:  height is 5\' 5"  (1.651 m) and weight is 146 lb (66.2 kg). Her oral temperature is 98.2 F (36.8 C). Her blood pressure is 118/78 and her pulse is 84. Her oxygen saturation is 98%.   General: Alert and oriented, in no acute distress HEENT: Head is normocephalic. Extraocular movements are intact. Oropharynx is clear. Neck: Neck is supple, no palpable cervical or supraclavicular lymphadenopathy. Heart: Regular in rate and rhythm with no murmurs, rubs, or gallops. Chest: Clear to auscultation bilaterally, with no rhonchi, wheezes, or rales. Extremities: No cyanosis or edema. Lymphatics: see Neck Exam Abdomen long vertical scar from exploratory laparotomy. Mild tenderness with palpation in right lower quadrant, with no rebound or guarding. There was no palpable mass in this area. No inguinal adenopathy.  Skin: No concerning lesions. Musculoskeletal: symmetric strength and muscle tone throughout. Neurologic: Cranial nerves II through XII are grossly intact. No obvious focalities. Speech is fluent. Coordination is intact. Psychiatric: Judgment  and insight are intact. Affect is appropriate.   ECOG = 1  LABORATORY DATA:  Lab Results  Component Value Date   WBC 5.6 09/07/2016   HGB 15.2 (H) 09/07/2016   HCT 45.3 09/07/2016   MCV 97.4 09/07/2016   PLT 251 09/07/2016   NEUTROABS 4.1 10/18/2014   Lab Results  Component Value Date   NA 142 09/23/2016   K 3.8 09/23/2016   CL 106 09/23/2016   CO2 30 09/23/2016   GLUCOSE 91 09/23/2016   CREATININE 0.81 09/23/2016   CALCIUM 9.2 09/23/2016      RADIOGRAPHY: Nm Pet Image Restag (ps) Skull Base To Thigh  Result Date: 09/22/2016 CLINICAL DATA:  Subsequent Treatment strategy for ovarian cancer. EXAM: NUCLEAR MEDICINE PET SKULL BASE TO THIGH TECHNIQUE: 7.2 mCi F-18 FDG was injected intravenously. Full-ring PET imaging was performed from the skull base to thigh after the radiotracer. CT data was obtained and used for attenuation correction and anatomic localization. FASTING BLOOD GLUCOSE:  Value: 93 mg/dl COMPARISON:  08/03/2012 FINDINGS: NECK No hypermetabolic lymph nodes in the neck. CHEST No hypermetabolic mediastinal or hilar nodes. Aortic atherosclerosis. No suspicious pulmonary nodules on the CT scan. ABDOMEN/PELVIS No abnormal hypermetabolic activity within the liver, pancreas, adrenal glands, or spleen. Aortic atherosclerosis noted. No hypermetabolic lymph nodes in the abdomen. Enlarged right external iliac lymph node is again identified. This measures 2.1 cm and has an SUV max equal to 18.08. SKELETON No focal  hypermetabolic activity to suggest skeletal metastasis. IMPRESSION: 1. There is malignant range FDG uptake associated with the enlarged right external iliac lymph node compatible with metastatic adenopathy. 2. No additional sites of metastatic disease identified. 3. Aortic atherosclerosis. Electronically Signed   By: Kerby Moors M.D.   On: 09/22/2016 16:05      IMPRESSION:  Recurrent high grade serous ovarian cancer. Patient has an isolated recurrence in right external   iliac nodal chain. She would be a good candidate for salvage radiation therapy directed at this area. I discussed the course of treatment, side effects, and potential toxicities with the patient. She appears to understand and wishes to proceed with treatment. Patient signed a consent form and a copy was placed in her chart.  PLAN:  Simulation and treatment planning on 10/13/15 at 11 am. Treatment will begin approximately 1 week after planning. I anticipate 5.5 weeks of radiation therapy.   ------------------------------------------------  Blair Promise, PhD, MD  This document serves as a record of services personally performed by Gery Pray, MD. It was created on his behalf by Bethann Humble, a trained medical scribe. The creation of this record is based on the scribe's personal observations and the provider's statements to them. This document has been checked and approved by the attending provider.

## 2016-10-12 ENCOUNTER — Ambulatory Visit
Admission: RE | Admit: 2016-10-12 | Discharge: 2016-10-12 | Disposition: A | Discharge status: Home / Self Care | Payer: Medicare Other | Source: Ambulatory Visit | Attending: Radiation Oncology | Admitting: Radiation Oncology

## 2016-10-12 DIAGNOSIS — C569 Malignant neoplasm of unspecified ovary: Secondary | ICD-10-CM

## 2016-10-12 DIAGNOSIS — Z510 Encounter for antineoplastic radiation therapy: Secondary | ICD-10-CM | POA: Diagnosis not present

## 2016-10-17 NOTE — Progress Notes (Signed)
  Radiation Oncology         [phone] ________________________________  Name: [name] [name] MRN: [id number]  Date: 10/12/2016  DOB: [birth year]  SIMULATION AND TREATMENT PLANNING NOTE    ICD-9-CM ICD-10-CM   1. Malignant neoplasm of ovary, unspecified laterality ([facility]) 183.0 C56.9     DIAGNOSIS:  Recurrent ovarian cancer  NARRATIVE:  The patient was brought to the [facility].  Identity was confirmed.  All relevant records and images related to the planned course of therapy were reviewed.  The patient freely provided informed written consent to proceed with treatment after reviewing the details related to the planned course of therapy. The consent form was witnessed and verified by the simulation staff.  Then, the patient was set-up in a stable reproducible  supine position for radiation therapy.  CT images were obtained.  Surface markings were placed.  The CT images were loaded into the planning software.  Then the target and avoidance structures were contoured.  Treatment planning then occurred.  The radiation prescription was entered and confirmed.  Then, I designed and supervised the construction of a total of 2 medically necessary complex treatment devices.  I have requested : Intensity Modulated Radiotherapy (IMRT) is medically necessary for this case for the following reason:  Small bowel sparing and use of a simultaneous integrated boost technique.  I have ordered:dose calc.  PLAN:  The patient will receive 56.0 Gy in 28 fractions with a simultaneous integrated boost to 60.2 gray.  -----------------------------------  Blair Promise, PhD, MD

## 2016-10-20 DIAGNOSIS — Z510 Encounter for antineoplastic radiation therapy: Secondary | ICD-10-CM | POA: Diagnosis not present

## 2016-10-21 ENCOUNTER — Ambulatory Visit: Payer: Medicare Other | Admitting: Radiation Oncology

## 2016-10-22 ENCOUNTER — Ambulatory Visit: Payer: Medicare Other

## 2016-10-25 ENCOUNTER — Ambulatory Visit
Admission: RE | Admit: 2016-10-25 | Discharge: 2016-10-25 | Disposition: A | Discharge status: Home / Self Care | Payer: Medicare Other | Source: Ambulatory Visit | Attending: Radiation Oncology | Admitting: Radiation Oncology

## 2016-10-25 DIAGNOSIS — Z510 Encounter for antineoplastic radiation therapy: Secondary | ICD-10-CM | POA: Diagnosis not present

## 2016-10-26 ENCOUNTER — Ambulatory Visit
Admission: RE | Admit: 2016-10-26 | Discharge: 2016-10-26 | Disposition: A | Discharge status: Home / Self Care | Payer: Medicare Other | Source: Ambulatory Visit | Attending: Radiation Oncology | Admitting: Radiation Oncology

## 2016-10-26 ENCOUNTER — Encounter: Payer: Self-pay | Admitting: Radiation Oncology

## 2016-10-26 VITALS — BP 105/74 | HR 87 | Temp 98.7°F | Ht 65.0 in | Wt 145.4 lb

## 2016-10-26 DIAGNOSIS — C569 Malignant neoplasm of unspecified ovary: Secondary | ICD-10-CM

## 2016-10-26 DIAGNOSIS — Z510 Encounter for antineoplastic radiation therapy: Secondary | ICD-10-CM | POA: Diagnosis not present

## 2016-10-26 NOTE — Progress Notes (Signed)
  Radiation Oncology         [phone] ________________________________  Name: [name] [name] MRN: [id number]  Date: 10/26/2016  DOB: November 22, 1950  Weekly Radiation Therapy Management    ICD-9-CM ICD-10-CM   1. Malignant neoplasm of ovary, unspecified laterality ([facility]) 183.0 C56.9      Current Dose: 4.3 Gy     Planned Dose:  60.2 Gy  Narrative . . . . . . . . The patient presents for routine under treatment assessment.                                    [name] [name] is here for her 2nd fraction of radiation to her Pelvis. She denies pain or fatigue. She is doing well, except for difficulty sleeping at night. She has a question today regarding scheduling an appointment with Dr. Fermin Schwab for a 6 month check up.                                  Set-up films were reviewed.                                 The chart was checked. Physical Findings. . .  height is 5\' 5"  (1.651 m) and weight is 145 lb 6.4 oz (66 kg). Her temperature is 98.7 F (37.1 C). Her blood pressure is 105/74 and her pulse is 87. Her oxygen saturation is 97%. . Weight essentially stable. Lungs are clear to auscultation bilaterally. Heart has regular rate and rhythm. Abdomen soft, non-tender, normal bowel sounds. Impression . . . . . . . The patient is tolerating radiation. Plan . . . . . . . . . . . . Continue treatment as planned.  ________________________________   Blair Promise, PhD, MD  This document serves as a record of services personally performed by Gery Pray, MD. It was created on his behalf by Darcus Austin, a trained medical scribe. The creation of this record is based on the scribe's personal observations and the provider's statements to them. This document has been checked and approved by the attending provider.

## 2016-10-26 NOTE — Progress Notes (Signed)
Pt here for patient teaching.  Pt given Radiation and You booklet and skin care instructions. Pt reports they have not watched the Radiation Therapy Education video, but was given the link to watch at home.  Reviewed areas of pertinence such as diarrhea, fatigue, hair loss, nausea and vomiting, skin changes and urinary and bladder changes . Pt able to give teach back of to pat skin, use unscented/gentle soap, use baby wipes, have Imodium on hand and drink plenty of water,. Pt verbalizes understanding of information given and will contact nursing with any questions or concerns.     [email or web address]

## 2016-10-26 NOTE — Progress Notes (Signed)
[name] [name] is here for her 2nd fraction of radiation to her Pelvis. She denies pain or fatigue. She is doing well except having difficulty sleeping at night. She was provided education today and voiced her understanding. She has a question today regarding scheduling an appointment with Dr. Fermin Schwab for a 6 month check up.  BP 105/74   Pulse 87   Temp 98.7 F (37.1 C)   Ht 5\' 5"  (1.651 m)   Wt 145 lb 6.4 oz (66 kg)   SpO2 97% Comment: room air  BMI 24.20 kg/m    Wt Readings from Last 3 Encounters:  10/26/16 145 lb 6.4 oz (66 kg)  10/08/16 146 lb (66.2 kg)  09/23/16 145 lb (65.8 kg)

## 2016-10-27 ENCOUNTER — Ambulatory Visit
Admission: RE | Admit: 2016-10-27 | Discharge: 2016-10-27 | Disposition: A | Discharge status: Home / Self Care | Payer: Medicare Other | Source: Ambulatory Visit | Attending: Radiation Oncology | Admitting: Radiation Oncology

## 2016-10-27 DIAGNOSIS — Z510 Encounter for antineoplastic radiation therapy: Secondary | ICD-10-CM | POA: Diagnosis not present

## 2016-10-28 ENCOUNTER — Ambulatory Visit
Admission: RE | Admit: 2016-10-28 | Discharge: 2016-10-28 | Disposition: A | Discharge status: Home / Self Care | Payer: Medicare Other | Source: Ambulatory Visit | Attending: Radiation Oncology | Admitting: Radiation Oncology

## 2016-10-28 DIAGNOSIS — Z510 Encounter for antineoplastic radiation therapy: Secondary | ICD-10-CM | POA: Diagnosis not present

## 2016-10-29 ENCOUNTER — Ambulatory Visit
Admission: RE | Admit: 2016-10-29 | Discharge: 2016-10-29 | Disposition: A | Discharge status: Home / Self Care | Payer: Medicare Other | Source: Ambulatory Visit | Attending: Radiation Oncology | Admitting: Radiation Oncology

## 2016-10-29 DIAGNOSIS — Z510 Encounter for antineoplastic radiation therapy: Secondary | ICD-10-CM | POA: Diagnosis not present

## 2016-11-01 ENCOUNTER — Ambulatory Visit
Admission: RE | Admit: 2016-11-01 | Discharge: 2016-11-01 | Disposition: A | Discharge status: Home / Self Care | Payer: Medicare Other | Source: Ambulatory Visit | Attending: Radiation Oncology | Admitting: Radiation Oncology

## 2016-11-01 DIAGNOSIS — Z510 Encounter for antineoplastic radiation therapy: Secondary | ICD-10-CM | POA: Diagnosis not present

## 2016-11-02 ENCOUNTER — Encounter: Payer: Self-pay | Admitting: Radiation Oncology

## 2016-11-02 ENCOUNTER — Ambulatory Visit
Admission: RE | Admit: 2016-11-02 | Discharge: 2016-11-02 | Disposition: A | Discharge status: Home / Self Care | Payer: Medicare Other | Source: Ambulatory Visit | Attending: Radiation Oncology | Admitting: Radiation Oncology

## 2016-11-02 VITALS — BP 113/79 | HR 78 | Temp 98.6°F | Ht 65.0 in | Wt 145.8 lb

## 2016-11-02 DIAGNOSIS — C569 Malignant neoplasm of unspecified ovary: Secondary | ICD-10-CM

## 2016-11-02 DIAGNOSIS — Z510 Encounter for antineoplastic radiation therapy: Secondary | ICD-10-CM | POA: Diagnosis not present

## 2016-11-02 NOTE — Progress Notes (Signed)
Tasharia Dreesen has completed 7 fractions to her pelvis.  She reports having soreness across her lower abdomen.  She denies having diarrhea.  She denies having any dysuria, vaginal bleeding or fatigue.  She is concerned that she is bruising easily.  She has a large bruise on her left hand and her right lower leg.  BP 113/79 (BP Location: Right Arm, Patient Position: Sitting)   Pulse 78   Temp 98.6 F (37 C) (Oral)   Ht 5\' 5"  (1.651 m)   Wt 145 lb 12.8 oz (66.1 kg)   SpO2 99%   BMI 24.26 kg/m    Wt Readings from Last 3 Encounters:  11/02/16 145 lb 12.8 oz (66.1 kg)  10/26/16 145 lb 6.4 oz (66 kg)  10/08/16 146 lb (66.2 kg)

## 2016-11-02 NOTE — Progress Notes (Signed)
  Radiation Oncology         [phone] ________________________________  Name: [name] [name] MRN: [id number]  Date: 11/02/2016  DOB: [birth year]  Weekly Radiation Therapy Management    ICD-9-CM ICD-10-CM   1. Malignant neoplasm of ovary, unspecified laterality (HCC) 183.0 C56.9      Current Dose: 15.05 Gy     Planned Dose:  60.2 Gy  Narrative . . . . . . . . The patient presents for routine under treatment assessment.                                    [name] [name] has completed 7 fractions to her Pelvis. She reports having soreness across her lower abdomen. She denies fatigue. She denies diarrhea, dysuria, or vaginal bleeding. The patient is concerned that she is bruising easily, and reports a large bruise on her left hand and her right lower leg. She reports no rectal bleeding or bleeding from her gums or nose bleeds.                                  Set-up films were reviewed.                                 The chart was checked. Physical Findings. . .  height is 5\' 5"  (1.651 m) and weight is 145 lb 12.8 oz (66.1 kg). Her oral temperature is 98.6 F (37 C). Her blood pressure is 113/79 and her pulse is 78. Her oxygen saturation is 99%.  Weight essentially stable. Lungs are clear to auscultation bilaterally. Heart has regular rate and rhythm. Impression . . . . . . . The patient is tolerating radiation. Plan . . . . . . . . . . . . Continue treatment as planned.  ________________________________   Blair Promise, PhD, MD  This document serves as a record of services personally performed by Gery Pray, MD. It was created on his behalf by Maryla Morrow, a trained medical scribe. The creation of this record is based on the scribe's personal observations and the provider's statements to them. This document has been checked and approved by the attending provider.

## 2016-11-03 ENCOUNTER — Ambulatory Visit
Admission: RE | Admit: 2016-11-03 | Discharge: 2016-11-03 | Disposition: A | Discharge status: Home / Self Care | Payer: Medicare Other | Source: Ambulatory Visit | Attending: Radiation Oncology | Admitting: Radiation Oncology

## 2016-11-03 DIAGNOSIS — Z510 Encounter for antineoplastic radiation therapy: Secondary | ICD-10-CM | POA: Diagnosis not present

## 2016-11-04 ENCOUNTER — Ambulatory Visit
Admission: RE | Admit: 2016-11-04 | Discharge: 2016-11-04 | Disposition: A | Discharge status: Home / Self Care | Payer: Medicare Other | Source: Ambulatory Visit | Attending: Radiation Oncology | Admitting: Radiation Oncology

## 2016-11-04 DIAGNOSIS — Z888 Allergy status to other drugs, medicaments and biological substances status: Secondary | ICD-10-CM | POA: Diagnosis not present

## 2016-11-04 DIAGNOSIS — Z510 Encounter for antineoplastic radiation therapy: Secondary | ICD-10-CM | POA: Diagnosis not present

## 2016-11-04 DIAGNOSIS — I341 Nonrheumatic mitral (valve) prolapse: Secondary | ICD-10-CM | POA: Diagnosis not present

## 2016-11-04 DIAGNOSIS — K589 Irritable bowel syndrome without diarrhea: Secondary | ICD-10-CM | POA: Diagnosis not present

## 2016-11-04 DIAGNOSIS — N840 Polyp of corpus uteri: Secondary | ICD-10-CM | POA: Diagnosis not present

## 2016-11-04 DIAGNOSIS — E7800 Pure hypercholesterolemia, unspecified: Secondary | ICD-10-CM | POA: Diagnosis not present

## 2016-11-04 DIAGNOSIS — M069 Rheumatoid arthritis, unspecified: Secondary | ICD-10-CM | POA: Diagnosis not present

## 2016-11-04 DIAGNOSIS — C569 Malignant neoplasm of unspecified ovary: Secondary | ICD-10-CM | POA: Diagnosis not present

## 2016-11-04 DIAGNOSIS — Z90710 Acquired absence of both cervix and uterus: Secondary | ICD-10-CM | POA: Diagnosis not present

## 2016-11-04 DIAGNOSIS — Z90722 Acquired absence of ovaries, bilateral: Secondary | ICD-10-CM | POA: Diagnosis not present

## 2016-11-05 ENCOUNTER — Ambulatory Visit
Admission: RE | Admit: 2016-11-05 | Discharge: 2016-11-05 | Disposition: A | Discharge status: Home / Self Care | Payer: Medicare Other | Source: Ambulatory Visit | Attending: Radiation Oncology | Admitting: Radiation Oncology

## 2016-11-05 DIAGNOSIS — Z510 Encounter for antineoplastic radiation therapy: Secondary | ICD-10-CM | POA: Diagnosis not present

## 2016-11-05 DIAGNOSIS — I341 Nonrheumatic mitral (valve) prolapse: Secondary | ICD-10-CM | POA: Diagnosis not present

## 2016-11-05 DIAGNOSIS — C569 Malignant neoplasm of unspecified ovary: Secondary | ICD-10-CM | POA: Diagnosis not present

## 2016-11-05 DIAGNOSIS — N840 Polyp of corpus uteri: Secondary | ICD-10-CM | POA: Diagnosis not present

## 2016-11-05 DIAGNOSIS — Z90722 Acquired absence of ovaries, bilateral: Secondary | ICD-10-CM | POA: Diagnosis not present

## 2016-11-05 DIAGNOSIS — M069 Rheumatoid arthritis, unspecified: Secondary | ICD-10-CM | POA: Diagnosis not present

## 2016-11-05 DIAGNOSIS — K589 Irritable bowel syndrome without diarrhea: Secondary | ICD-10-CM | POA: Diagnosis not present

## 2016-11-05 DIAGNOSIS — E7800 Pure hypercholesterolemia, unspecified: Secondary | ICD-10-CM | POA: Diagnosis not present

## 2016-11-05 DIAGNOSIS — Z90710 Acquired absence of both cervix and uterus: Secondary | ICD-10-CM | POA: Diagnosis not present

## 2016-11-05 DIAGNOSIS — Z888 Allergy status to other drugs, medicaments and biological substances status: Secondary | ICD-10-CM | POA: Diagnosis not present

## 2016-11-08 ENCOUNTER — Ambulatory Visit
Admission: RE | Admit: 2016-11-08 | Discharge: 2016-11-08 | Disposition: A | Discharge status: Home / Self Care | Payer: Medicare Other | Source: Ambulatory Visit | Attending: Radiation Oncology | Admitting: Radiation Oncology

## 2016-11-08 DIAGNOSIS — K589 Irritable bowel syndrome without diarrhea: Secondary | ICD-10-CM | POA: Diagnosis not present

## 2016-11-08 DIAGNOSIS — Z888 Allergy status to other drugs, medicaments and biological substances status: Secondary | ICD-10-CM | POA: Diagnosis not present

## 2016-11-08 DIAGNOSIS — N840 Polyp of corpus uteri: Secondary | ICD-10-CM | POA: Diagnosis not present

## 2016-11-08 DIAGNOSIS — Z510 Encounter for antineoplastic radiation therapy: Secondary | ICD-10-CM | POA: Diagnosis not present

## 2016-11-08 DIAGNOSIS — Z90710 Acquired absence of both cervix and uterus: Secondary | ICD-10-CM | POA: Diagnosis not present

## 2016-11-08 DIAGNOSIS — I341 Nonrheumatic mitral (valve) prolapse: Secondary | ICD-10-CM | POA: Diagnosis not present

## 2016-11-08 DIAGNOSIS — Z90722 Acquired absence of ovaries, bilateral: Secondary | ICD-10-CM | POA: Diagnosis not present

## 2016-11-08 DIAGNOSIS — C569 Malignant neoplasm of unspecified ovary: Secondary | ICD-10-CM | POA: Diagnosis not present

## 2016-11-08 DIAGNOSIS — E7800 Pure hypercholesterolemia, unspecified: Secondary | ICD-10-CM | POA: Diagnosis not present

## 2016-11-08 DIAGNOSIS — M069 Rheumatoid arthritis, unspecified: Secondary | ICD-10-CM | POA: Diagnosis not present

## 2016-11-09 ENCOUNTER — Ambulatory Visit
Admission: RE | Admit: 2016-11-09 | Discharge: 2016-11-09 | Disposition: A | Discharge status: Home / Self Care | Payer: Medicare Other | Source: Ambulatory Visit | Attending: Radiation Oncology | Admitting: Radiation Oncology

## 2016-11-09 ENCOUNTER — Encounter: Payer: Self-pay | Admitting: Radiation Oncology

## 2016-11-09 VITALS — BP 116/75 | HR 75 | Temp 98.2°F | Ht 65.0 in | Wt 145.6 lb

## 2016-11-09 DIAGNOSIS — N840 Polyp of corpus uteri: Secondary | ICD-10-CM | POA: Diagnosis not present

## 2016-11-09 DIAGNOSIS — C569 Malignant neoplasm of unspecified ovary: Secondary | ICD-10-CM | POA: Diagnosis not present

## 2016-11-09 DIAGNOSIS — Z90710 Acquired absence of both cervix and uterus: Secondary | ICD-10-CM | POA: Diagnosis not present

## 2016-11-09 DIAGNOSIS — Z888 Allergy status to other drugs, medicaments and biological substances status: Secondary | ICD-10-CM | POA: Diagnosis not present

## 2016-11-09 DIAGNOSIS — K589 Irritable bowel syndrome without diarrhea: Secondary | ICD-10-CM | POA: Diagnosis not present

## 2016-11-09 DIAGNOSIS — M069 Rheumatoid arthritis, unspecified: Secondary | ICD-10-CM | POA: Diagnosis not present

## 2016-11-09 DIAGNOSIS — E7800 Pure hypercholesterolemia, unspecified: Secondary | ICD-10-CM | POA: Diagnosis not present

## 2016-11-09 DIAGNOSIS — Z510 Encounter for antineoplastic radiation therapy: Secondary | ICD-10-CM | POA: Diagnosis not present

## 2016-11-09 DIAGNOSIS — I341 Nonrheumatic mitral (valve) prolapse: Secondary | ICD-10-CM | POA: Diagnosis not present

## 2016-11-09 DIAGNOSIS — Z90722 Acquired absence of ovaries, bilateral: Secondary | ICD-10-CM | POA: Diagnosis not present

## 2016-11-09 DIAGNOSIS — R300 Dysuria: Secondary | ICD-10-CM

## 2016-11-09 NOTE — Progress Notes (Signed)
  Radiation Oncology         [phone] ________________________________  Name: [name] [name] MRN: [id number]  Date: 11/09/2016  DOB: [birth year]  Weekly Radiation Therapy Management    ICD-9-CM ICD-10-CM   1. Dysuria 788.1 R30.0 Urinalysis, Microscopic - [facility]     Urine culture  2. Malignant neoplasm of ovary, unspecified laterality (HCC) 183.0 C56.9      Current Dose: 25.8 Gy     Planned Dose:  60.2 Gy  Narrative . . . . . . . . The patient presents for routine under treatment assessment.                                    [name] [name] has completed 12 fractions to her pelvis. She reports having soreness in her right side under her rib cage. She said she feels a knot there and can feel it more noticeably when she sits and crosses her legs. She said she noticed this before radiation started. She reports that her urine is very yellow and has a foul order. She reports occasional dysuria. She denies diarrhea or vaginal bleeding.  She reports having fatigue in the afternoons.                                  Set-up films were reviewed.                                 The chart was checked. Physical Findings. . .  height is 5\' 5"  (1.651 m) and weight is 145 lb 9.6 oz (66 kg). Her oral temperature is 98.2 F (36.8 C). Her blood pressure is 116/75 and her pulse is 75. Her oxygen saturation is 100%.  Weight essentially stable. Lungs are clear to auscultation bilaterally. Heart has regular rate and rhythm. Abdomen soft, non-tender, normal bowel sounds. Careful exam of the abdominal area revealed no palpable mass. Impression . . . . . . . The patient is tolerating radiation. Plan . . . . . . . . . . . . Continue treatment as planned. The patient will provide a urine sample tomorrow at [facility]. An order for this will be placed. ________________________________   Blair Promise, PhD, MD  This document serves as a record of services personally performed by Gery Pray, MD. It was created on his  behalf by Darcus Austin, a trained medical scribe. The creation of this record is based on the scribe's personal observations and the provider's statements to them. This document has been checked and approved by the attending provider.

## 2016-11-09 NOTE — Progress Notes (Signed)
[name] [name] has completed 12 fractions to her pelvis.  She reports having soreness in her right side under her rib cage.  She said she feels a knot there and can really feel it when she sits and crosses her legs.  She said she noticed this before radiation started. She reports that her urine is very yellow and has a foul order.  She denies having dysuria.  She denies having diarrhea.  She denies having any vaginal bleeding.  She reports having fatigue in the afternoons.  BP 116/75 (BP Location: Right Arm, Patient Position: Sitting)   Pulse 75   Temp 98.2 F (36.8 C) (Oral)   Ht 5\' 5"  (1.651 m)   Wt 145 lb 9.6 oz (66 kg)   SpO2 100%   BMI 24.23 kg/m    Wt Readings from Last 3 Encounters:  11/09/16 145 lb 9.6 oz (66 kg)  11/02/16 145 lb 12.8 oz (66.1 kg)  10/26/16 145 lb 6.4 oz (66 kg)

## 2016-11-10 ENCOUNTER — Ambulatory Visit
Admission: RE | Admit: 2016-11-10 | Discharge: 2016-11-10 | Disposition: A | Discharge status: Home / Self Care | Payer: Medicare Other | Source: Ambulatory Visit | Attending: Radiation Oncology | Admitting: Radiation Oncology

## 2016-11-10 DIAGNOSIS — K589 Irritable bowel syndrome without diarrhea: Secondary | ICD-10-CM | POA: Diagnosis not present

## 2016-11-10 DIAGNOSIS — R300 Dysuria: Secondary | ICD-10-CM | POA: Diagnosis not present

## 2016-11-10 DIAGNOSIS — N840 Polyp of corpus uteri: Secondary | ICD-10-CM | POA: Diagnosis not present

## 2016-11-10 DIAGNOSIS — Z888 Allergy status to other drugs, medicaments and biological substances status: Secondary | ICD-10-CM | POA: Diagnosis not present

## 2016-11-10 DIAGNOSIS — M069 Rheumatoid arthritis, unspecified: Secondary | ICD-10-CM | POA: Diagnosis not present

## 2016-11-10 DIAGNOSIS — Z510 Encounter for antineoplastic radiation therapy: Secondary | ICD-10-CM | POA: Diagnosis not present

## 2016-11-10 DIAGNOSIS — Z90710 Acquired absence of both cervix and uterus: Secondary | ICD-10-CM | POA: Diagnosis not present

## 2016-11-10 DIAGNOSIS — E7800 Pure hypercholesterolemia, unspecified: Secondary | ICD-10-CM | POA: Diagnosis not present

## 2016-11-10 DIAGNOSIS — C569 Malignant neoplasm of unspecified ovary: Secondary | ICD-10-CM | POA: Diagnosis not present

## 2016-11-10 DIAGNOSIS — I341 Nonrheumatic mitral (valve) prolapse: Secondary | ICD-10-CM | POA: Diagnosis not present

## 2016-11-10 DIAGNOSIS — Z90722 Acquired absence of ovaries, bilateral: Secondary | ICD-10-CM | POA: Diagnosis not present

## 2016-11-10 LAB — URINALYSIS, MICROSCOPIC - CHCC
Bilirubin (Urine): NEGATIVE
Blood: NEGATIVE
GLUCOSE UR CHCC: NEGATIVE mg/dL
Ketones: NEGATIVE mg/dL
NITRITE: NEGATIVE
PROTEIN: NEGATIVE mg/dL
SPECIFIC GRAVITY, URINE: 1.03 (ref 1.003–1.035)
UROBILINOGEN UR: 0.2 mg/dL (ref 0.2–1)
pH: 6 (ref 4.6–8.0)

## 2016-11-11 ENCOUNTER — Ambulatory Visit
Admission: RE | Admit: 2016-11-11 | Discharge: 2016-11-11 | Disposition: A | Discharge status: Home / Self Care | Payer: Medicare Other | Source: Ambulatory Visit | Attending: Radiation Oncology | Admitting: Radiation Oncology

## 2016-11-11 DIAGNOSIS — C569 Malignant neoplasm of unspecified ovary: Secondary | ICD-10-CM | POA: Diagnosis not present

## 2016-11-11 DIAGNOSIS — N840 Polyp of corpus uteri: Secondary | ICD-10-CM | POA: Diagnosis not present

## 2016-11-11 DIAGNOSIS — E7800 Pure hypercholesterolemia, unspecified: Secondary | ICD-10-CM | POA: Diagnosis not present

## 2016-11-11 DIAGNOSIS — K589 Irritable bowel syndrome without diarrhea: Secondary | ICD-10-CM | POA: Diagnosis not present

## 2016-11-11 DIAGNOSIS — Z90710 Acquired absence of both cervix and uterus: Secondary | ICD-10-CM | POA: Diagnosis not present

## 2016-11-11 DIAGNOSIS — Z90722 Acquired absence of ovaries, bilateral: Secondary | ICD-10-CM | POA: Diagnosis not present

## 2016-11-11 DIAGNOSIS — I341 Nonrheumatic mitral (valve) prolapse: Secondary | ICD-10-CM | POA: Diagnosis not present

## 2016-11-11 DIAGNOSIS — Z510 Encounter for antineoplastic radiation therapy: Secondary | ICD-10-CM | POA: Diagnosis not present

## 2016-11-11 DIAGNOSIS — Z888 Allergy status to other drugs, medicaments and biological substances status: Secondary | ICD-10-CM | POA: Diagnosis not present

## 2016-11-11 DIAGNOSIS — M069 Rheumatoid arthritis, unspecified: Secondary | ICD-10-CM | POA: Diagnosis not present

## 2016-11-12 ENCOUNTER — Ambulatory Visit
Admission: RE | Admit: 2016-11-12 | Discharge: 2016-11-12 | Disposition: A | Discharge status: Home / Self Care | Payer: Medicare Other | Source: Ambulatory Visit | Attending: Radiation Oncology | Admitting: Radiation Oncology

## 2016-11-12 DIAGNOSIS — Z90710 Acquired absence of both cervix and uterus: Secondary | ICD-10-CM | POA: Diagnosis not present

## 2016-11-12 DIAGNOSIS — K589 Irritable bowel syndrome without diarrhea: Secondary | ICD-10-CM | POA: Diagnosis not present

## 2016-11-12 DIAGNOSIS — Z90722 Acquired absence of ovaries, bilateral: Secondary | ICD-10-CM | POA: Diagnosis not present

## 2016-11-12 DIAGNOSIS — M069 Rheumatoid arthritis, unspecified: Secondary | ICD-10-CM | POA: Diagnosis not present

## 2016-11-12 DIAGNOSIS — Z510 Encounter for antineoplastic radiation therapy: Secondary | ICD-10-CM | POA: Diagnosis not present

## 2016-11-12 DIAGNOSIS — Z888 Allergy status to other drugs, medicaments and biological substances status: Secondary | ICD-10-CM | POA: Diagnosis not present

## 2016-11-12 DIAGNOSIS — N840 Polyp of corpus uteri: Secondary | ICD-10-CM | POA: Diagnosis not present

## 2016-11-12 DIAGNOSIS — I341 Nonrheumatic mitral (valve) prolapse: Secondary | ICD-10-CM | POA: Diagnosis not present

## 2016-11-12 DIAGNOSIS — E7800 Pure hypercholesterolemia, unspecified: Secondary | ICD-10-CM | POA: Diagnosis not present

## 2016-11-12 DIAGNOSIS — C569 Malignant neoplasm of unspecified ovary: Secondary | ICD-10-CM | POA: Diagnosis not present

## 2016-11-12 LAB — URINE CULTURE

## 2016-11-15 ENCOUNTER — Ambulatory Visit
Admission: RE | Admit: 2016-11-15 | Discharge: 2016-11-15 | Disposition: A | Discharge status: Home / Self Care | Payer: Medicare Other | Source: Ambulatory Visit | Attending: Radiation Oncology | Admitting: Radiation Oncology

## 2016-11-15 DIAGNOSIS — Z90722 Acquired absence of ovaries, bilateral: Secondary | ICD-10-CM | POA: Diagnosis not present

## 2016-11-15 DIAGNOSIS — I341 Nonrheumatic mitral (valve) prolapse: Secondary | ICD-10-CM | POA: Diagnosis not present

## 2016-11-15 DIAGNOSIS — M069 Rheumatoid arthritis, unspecified: Secondary | ICD-10-CM | POA: Diagnosis not present

## 2016-11-15 DIAGNOSIS — Z888 Allergy status to other drugs, medicaments and biological substances status: Secondary | ICD-10-CM | POA: Diagnosis not present

## 2016-11-15 DIAGNOSIS — K589 Irritable bowel syndrome without diarrhea: Secondary | ICD-10-CM | POA: Diagnosis not present

## 2016-11-15 DIAGNOSIS — C569 Malignant neoplasm of unspecified ovary: Secondary | ICD-10-CM | POA: Diagnosis not present

## 2016-11-15 DIAGNOSIS — N840 Polyp of corpus uteri: Secondary | ICD-10-CM | POA: Diagnosis not present

## 2016-11-15 DIAGNOSIS — Z90710 Acquired absence of both cervix and uterus: Secondary | ICD-10-CM | POA: Diagnosis not present

## 2016-11-15 DIAGNOSIS — E7800 Pure hypercholesterolemia, unspecified: Secondary | ICD-10-CM | POA: Diagnosis not present

## 2016-11-15 DIAGNOSIS — Z510 Encounter for antineoplastic radiation therapy: Secondary | ICD-10-CM | POA: Diagnosis not present

## 2016-11-16 ENCOUNTER — Ambulatory Visit
Admission: RE | Admit: 2016-11-16 | Discharge: 2016-11-16 | Disposition: A | Discharge status: Home / Self Care | Payer: Medicare Other | Source: Ambulatory Visit | Attending: Radiation Oncology | Admitting: Radiation Oncology

## 2016-11-16 ENCOUNTER — Encounter: Payer: Self-pay | Admitting: Radiation Oncology

## 2016-11-16 VITALS — BP 109/74 | HR 91 | Temp 97.7°F | Ht 65.0 in | Wt 144.4 lb

## 2016-11-16 DIAGNOSIS — M069 Rheumatoid arthritis, unspecified: Secondary | ICD-10-CM | POA: Diagnosis not present

## 2016-11-16 DIAGNOSIS — I341 Nonrheumatic mitral (valve) prolapse: Secondary | ICD-10-CM | POA: Diagnosis not present

## 2016-11-16 DIAGNOSIS — Z888 Allergy status to other drugs, medicaments and biological substances status: Secondary | ICD-10-CM | POA: Diagnosis not present

## 2016-11-16 DIAGNOSIS — C569 Malignant neoplasm of unspecified ovary: Secondary | ICD-10-CM

## 2016-11-16 DIAGNOSIS — K589 Irritable bowel syndrome without diarrhea: Secondary | ICD-10-CM | POA: Diagnosis not present

## 2016-11-16 DIAGNOSIS — Z510 Encounter for antineoplastic radiation therapy: Secondary | ICD-10-CM | POA: Diagnosis not present

## 2016-11-16 DIAGNOSIS — Z90722 Acquired absence of ovaries, bilateral: Secondary | ICD-10-CM | POA: Diagnosis not present

## 2016-11-16 DIAGNOSIS — N840 Polyp of corpus uteri: Secondary | ICD-10-CM | POA: Diagnosis not present

## 2016-11-16 DIAGNOSIS — Z90710 Acquired absence of both cervix and uterus: Secondary | ICD-10-CM | POA: Diagnosis not present

## 2016-11-16 DIAGNOSIS — E7800 Pure hypercholesterolemia, unspecified: Secondary | ICD-10-CM | POA: Diagnosis not present

## 2016-11-16 NOTE — Progress Notes (Signed)
[name] [name] has completed 17 fractions to her pelvis.  She reports having pain in both sides occasionally and is rating it at a 2/10.  She still reports having a foul odor with urination.  She is wondering about the results of her urine culture.  She reports having diarrhea on Sunday 4 times.  She has not had to take Imodium yet.  She reports having fatigue.  BP 109/74 (BP Location: Right Arm, Patient Position: Sitting)   Pulse 91   Temp 97.7 F (36.5 C) (Oral)   Ht 5\' 5"  (1.651 m)   Wt 144 lb 6.4 oz (65.5 kg)   SpO2 98%   BMI 24.03 kg/m    Wt Readings from Last 3 Encounters:  11/16/16 144 lb 6.4 oz (65.5 kg)  11/09/16 145 lb 9.6 oz (66 kg)  11/02/16 145 lb 12.8 oz (66.1 kg)

## 2016-11-16 NOTE — Progress Notes (Signed)
  Radiation Oncology         [phone] ________________________________  Name: [name] [name] MRN: [id number]  Date: 11/16/2016  DOB: Feb 24, 1951  Weekly Radiation Therapy Management    ICD-9-CM ICD-10-CM   1. Malignant neoplasm of ovary, unspecified laterality (HCC) 183.0 C56.9      Current Dose: 36.55 Gy     Planned Dose:  60.2 Gy  Narrative . . . . . . . . The patient presents for routine under treatment assessment.                                   The patient has completed 17 fractions to her pelvis. She reports pain in both sides occasionally and is rating it as a 2/10.  She still reports having a foul odor with urination. She is wondering about the results of her urine culture. Urinalysis and urine culture on 11/10/16 were negative. She reports having diarrhea four times on Sunday.  She has not had to take Imodium yet.  She reports having fatigue. The patient states she is drinking as much water as she is supposed to.                                  Set-up films were reviewed.                                 The chart was checked. Physical Findings. . .  height is 5\' 5"  (1.651 m) and weight is 144 lb 6.4 oz (65.5 kg). Her oral temperature is 97.7 F (36.5 C). Her blood pressure is 109/74 and her pulse is 91. Her oxygen saturation is 98%.  Weight essentially stable. Lungs are clear to auscultation bilaterally. Heart has regular rate and rhythm. Abdomen soft, non-tender, normal bowel sounds. Impression . . . . . . . The patient is tolerating radiation. Plan . . . . . . . . . . . . Continue treatment as planned. I advised the patient to drink cranberry juice and drink more water to  dilute her urine. ________________________________   Blair Promise, PhD, MD  This document serves as a record of services personally performed by Gery Pray, MD. It was created on his behalf by Darcus Austin, a trained medical scribe. The creation of this record is based on the scribe's personal  observations and the provider's statements to them. This document has been checked and approved by the attending provider.

## 2016-11-17 ENCOUNTER — Ambulatory Visit
Admission: RE | Admit: 2016-11-17 | Discharge: 2016-11-17 | Disposition: A | Discharge status: Home / Self Care | Payer: Medicare Other | Source: Ambulatory Visit | Attending: Radiation Oncology | Admitting: Radiation Oncology

## 2016-11-17 DIAGNOSIS — C569 Malignant neoplasm of unspecified ovary: Secondary | ICD-10-CM | POA: Diagnosis not present

## 2016-11-17 DIAGNOSIS — Z888 Allergy status to other drugs, medicaments and biological substances status: Secondary | ICD-10-CM | POA: Diagnosis not present

## 2016-11-17 DIAGNOSIS — N840 Polyp of corpus uteri: Secondary | ICD-10-CM | POA: Diagnosis not present

## 2016-11-17 DIAGNOSIS — I341 Nonrheumatic mitral (valve) prolapse: Secondary | ICD-10-CM | POA: Diagnosis not present

## 2016-11-17 DIAGNOSIS — M069 Rheumatoid arthritis, unspecified: Secondary | ICD-10-CM | POA: Diagnosis not present

## 2016-11-17 DIAGNOSIS — K589 Irritable bowel syndrome without diarrhea: Secondary | ICD-10-CM | POA: Diagnosis not present

## 2016-11-17 DIAGNOSIS — Z90722 Acquired absence of ovaries, bilateral: Secondary | ICD-10-CM | POA: Diagnosis not present

## 2016-11-17 DIAGNOSIS — Z510 Encounter for antineoplastic radiation therapy: Secondary | ICD-10-CM | POA: Diagnosis not present

## 2016-11-17 DIAGNOSIS — Z90710 Acquired absence of both cervix and uterus: Secondary | ICD-10-CM | POA: Diagnosis not present

## 2016-11-17 DIAGNOSIS — E7800 Pure hypercholesterolemia, unspecified: Secondary | ICD-10-CM | POA: Diagnosis not present

## 2016-11-18 ENCOUNTER — Ambulatory Visit
Admission: RE | Admit: 2016-11-18 | Discharge: 2016-11-18 | Disposition: A | Discharge status: Home / Self Care | Payer: Medicare Other | Source: Ambulatory Visit | Attending: Radiation Oncology | Admitting: Radiation Oncology

## 2016-11-18 DIAGNOSIS — I341 Nonrheumatic mitral (valve) prolapse: Secondary | ICD-10-CM | POA: Diagnosis not present

## 2016-11-18 DIAGNOSIS — Z90722 Acquired absence of ovaries, bilateral: Secondary | ICD-10-CM | POA: Diagnosis not present

## 2016-11-18 DIAGNOSIS — Z888 Allergy status to other drugs, medicaments and biological substances status: Secondary | ICD-10-CM | POA: Diagnosis not present

## 2016-11-18 DIAGNOSIS — M069 Rheumatoid arthritis, unspecified: Secondary | ICD-10-CM | POA: Diagnosis not present

## 2016-11-18 DIAGNOSIS — Z510 Encounter for antineoplastic radiation therapy: Secondary | ICD-10-CM | POA: Diagnosis not present

## 2016-11-18 DIAGNOSIS — N840 Polyp of corpus uteri: Secondary | ICD-10-CM | POA: Diagnosis not present

## 2016-11-18 DIAGNOSIS — E7800 Pure hypercholesterolemia, unspecified: Secondary | ICD-10-CM | POA: Diagnosis not present

## 2016-11-18 DIAGNOSIS — K589 Irritable bowel syndrome without diarrhea: Secondary | ICD-10-CM | POA: Diagnosis not present

## 2016-11-18 DIAGNOSIS — C569 Malignant neoplasm of unspecified ovary: Secondary | ICD-10-CM | POA: Diagnosis not present

## 2016-11-18 DIAGNOSIS — Z90710 Acquired absence of both cervix and uterus: Secondary | ICD-10-CM | POA: Diagnosis not present

## 2016-11-19 ENCOUNTER — Ambulatory Visit
Admission: RE | Admit: 2016-11-19 | Discharge: 2016-11-19 | Disposition: A | Discharge status: Home / Self Care | Payer: Medicare Other | Source: Ambulatory Visit | Attending: Radiation Oncology | Admitting: Radiation Oncology

## 2016-11-19 DIAGNOSIS — M069 Rheumatoid arthritis, unspecified: Secondary | ICD-10-CM | POA: Diagnosis not present

## 2016-11-19 DIAGNOSIS — K589 Irritable bowel syndrome without diarrhea: Secondary | ICD-10-CM | POA: Diagnosis not present

## 2016-11-19 DIAGNOSIS — Z888 Allergy status to other drugs, medicaments and biological substances status: Secondary | ICD-10-CM | POA: Diagnosis not present

## 2016-11-19 DIAGNOSIS — I341 Nonrheumatic mitral (valve) prolapse: Secondary | ICD-10-CM | POA: Diagnosis not present

## 2016-11-19 DIAGNOSIS — N840 Polyp of corpus uteri: Secondary | ICD-10-CM | POA: Diagnosis not present

## 2016-11-19 DIAGNOSIS — Z90710 Acquired absence of both cervix and uterus: Secondary | ICD-10-CM | POA: Diagnosis not present

## 2016-11-19 DIAGNOSIS — Z510 Encounter for antineoplastic radiation therapy: Secondary | ICD-10-CM | POA: Diagnosis not present

## 2016-11-19 DIAGNOSIS — C569 Malignant neoplasm of unspecified ovary: Secondary | ICD-10-CM | POA: Diagnosis not present

## 2016-11-19 DIAGNOSIS — E7800 Pure hypercholesterolemia, unspecified: Secondary | ICD-10-CM | POA: Diagnosis not present

## 2016-11-19 DIAGNOSIS — Z90722 Acquired absence of ovaries, bilateral: Secondary | ICD-10-CM | POA: Diagnosis not present

## 2016-11-22 ENCOUNTER — Ambulatory Visit
Admission: RE | Admit: 2016-11-22 | Discharge: 2016-11-22 | Disposition: A | Discharge status: Home / Self Care | Payer: Medicare Other | Source: Ambulatory Visit | Attending: Radiation Oncology | Admitting: Radiation Oncology

## 2016-11-22 DIAGNOSIS — I341 Nonrheumatic mitral (valve) prolapse: Secondary | ICD-10-CM | POA: Diagnosis not present

## 2016-11-22 DIAGNOSIS — C569 Malignant neoplasm of unspecified ovary: Secondary | ICD-10-CM | POA: Diagnosis not present

## 2016-11-22 DIAGNOSIS — Z510 Encounter for antineoplastic radiation therapy: Secondary | ICD-10-CM | POA: Diagnosis not present

## 2016-11-22 DIAGNOSIS — Z90722 Acquired absence of ovaries, bilateral: Secondary | ICD-10-CM | POA: Diagnosis not present

## 2016-11-22 DIAGNOSIS — K589 Irritable bowel syndrome without diarrhea: Secondary | ICD-10-CM | POA: Diagnosis not present

## 2016-11-22 DIAGNOSIS — N840 Polyp of corpus uteri: Secondary | ICD-10-CM | POA: Diagnosis not present

## 2016-11-22 DIAGNOSIS — Z888 Allergy status to other drugs, medicaments and biological substances status: Secondary | ICD-10-CM | POA: Diagnosis not present

## 2016-11-22 DIAGNOSIS — M069 Rheumatoid arthritis, unspecified: Secondary | ICD-10-CM | POA: Diagnosis not present

## 2016-11-22 DIAGNOSIS — E7800 Pure hypercholesterolemia, unspecified: Secondary | ICD-10-CM | POA: Diagnosis not present

## 2016-11-22 DIAGNOSIS — Z90710 Acquired absence of both cervix and uterus: Secondary | ICD-10-CM | POA: Diagnosis not present

## 2016-11-23 ENCOUNTER — Ambulatory Visit
Admission: RE | Admit: 2016-11-23 | Discharge: 2016-11-23 | Disposition: A | Discharge status: Home / Self Care | Payer: Medicare Other | Source: Ambulatory Visit | Attending: Radiation Oncology | Admitting: Radiation Oncology

## 2016-11-23 ENCOUNTER — Encounter: Payer: Self-pay | Admitting: Radiation Oncology

## 2016-11-23 VITALS — BP 111/75 | HR 83 | Temp 99.1°F | Resp 18 | Wt 144.6 lb

## 2016-11-23 DIAGNOSIS — Z90722 Acquired absence of ovaries, bilateral: Secondary | ICD-10-CM | POA: Diagnosis not present

## 2016-11-23 DIAGNOSIS — Z510 Encounter for antineoplastic radiation therapy: Secondary | ICD-10-CM | POA: Diagnosis not present

## 2016-11-23 DIAGNOSIS — C569 Malignant neoplasm of unspecified ovary: Secondary | ICD-10-CM | POA: Diagnosis not present

## 2016-11-23 DIAGNOSIS — I341 Nonrheumatic mitral (valve) prolapse: Secondary | ICD-10-CM | POA: Diagnosis not present

## 2016-11-23 DIAGNOSIS — N840 Polyp of corpus uteri: Secondary | ICD-10-CM | POA: Diagnosis not present

## 2016-11-23 DIAGNOSIS — E7800 Pure hypercholesterolemia, unspecified: Secondary | ICD-10-CM | POA: Diagnosis not present

## 2016-11-23 DIAGNOSIS — K589 Irritable bowel syndrome without diarrhea: Secondary | ICD-10-CM | POA: Diagnosis not present

## 2016-11-23 DIAGNOSIS — Z90710 Acquired absence of both cervix and uterus: Secondary | ICD-10-CM | POA: Diagnosis not present

## 2016-11-23 DIAGNOSIS — Z888 Allergy status to other drugs, medicaments and biological substances status: Secondary | ICD-10-CM | POA: Diagnosis not present

## 2016-11-23 DIAGNOSIS — M069 Rheumatoid arthritis, unspecified: Secondary | ICD-10-CM | POA: Diagnosis not present

## 2016-11-23 NOTE — Progress Notes (Signed)
  Radiation Oncology         (336) [phone] ________________________________  Name: [name] [name] MRN: [id number]  Date: 11/23/2016  DOB: 10/26/1950  Weekly Radiation Therapy Management    ICD-9-CM ICD-10-CM   1. Malignant neoplasm of ovary, unspecified laterality (HCC) 183.0 C56.9      Current Dose: 47.3 Gy     Planned Dose:  60.2 Gy  Narrative . . . . . . . . The patient presents for routine under treatment assessment.                                   [name] [name] completed her 22nd fraction to the pelvis. She reports having pain in her lower abdomen that she is rating as a 2/10 and this pain radiations down her left thigh when she stands. She also reports inguinal swelling after starting radiation. Reports discoloration and foul smelling urine. Urinalysis and urine culture on 11/10/16 were negative/clear. Patient states she is not having any issues with bowel movements and usually has a bowel movement every 2-3 days. Reports having mild fatigue. No complaints with appetite.                                  Set-up films were reviewed.                                 The chart was checked. Physical Findings. . .  weight is 144 lb 9.6 oz (65.6 kg). Her oral temperature is 99.1 F (37.3 C). Her blood pressure is 111/75 and her pulse is 83. Her respiration is 18 and oxygen saturation is 98%.  Weight essentially stable. Lungs are clear to auscultation bilaterally. Heart has regular rate and rhythm. Abdomen soft, non-tender, normal bowel sounds. Impression . . . . . . . The patient is tolerating radiation. Plan . . . . . . . . . . . . Continue treatment as planned. ________________________________   Blair Promise, PhD, MD  This document serves as a record of services personally performed by Gery Pray, MD. It was created on his behalf by Darcus Austin, a trained medical scribe. The creation of this record is based on the scribe's personal observations and the provider's statements to them.  This document has been checked and approved by the attending provider.

## 2016-11-23 NOTE — Progress Notes (Signed)
[name] [name] completed 22nd fraction to pelvis.  Reports having pain on lower abdomen that she is rating 2/10.  Reports discoloration and foul smell of urine.  Patient states she is not having any issues with bowel movements, usually has bowel movement every 2-3 days.  Reports having mild fatigue.  No complaints with appetite.  Vitals:   11/23/16 1551  BP: 111/75  Pulse: 83  Resp: 18  Temp: 99.1 F (37.3 C)  TempSrc: Oral  SpO2: 98%  Weight: 144 lb 9.6 oz (65.6 kg)     Wt Readings from Last 3 Encounters:  11/23/16 144 lb 9.6 oz (65.6 kg)  11/16/16 144 lb 6.4 oz (65.5 kg)  11/09/16 145 lb 9.6 oz (66 kg)

## 2016-11-24 ENCOUNTER — Ambulatory Visit
Admission: RE | Admit: 2016-11-24 | Discharge: 2016-11-24 | Disposition: A | Discharge status: Home / Self Care | Payer: Medicare Other | Source: Ambulatory Visit | Attending: Radiation Oncology | Admitting: Radiation Oncology

## 2016-11-24 DIAGNOSIS — Z888 Allergy status to other drugs, medicaments and biological substances status: Secondary | ICD-10-CM | POA: Diagnosis not present

## 2016-11-24 DIAGNOSIS — Z90710 Acquired absence of both cervix and uterus: Secondary | ICD-10-CM | POA: Diagnosis not present

## 2016-11-24 DIAGNOSIS — E7800 Pure hypercholesterolemia, unspecified: Secondary | ICD-10-CM | POA: Diagnosis not present

## 2016-11-24 DIAGNOSIS — C569 Malignant neoplasm of unspecified ovary: Secondary | ICD-10-CM | POA: Diagnosis not present

## 2016-11-24 DIAGNOSIS — I341 Nonrheumatic mitral (valve) prolapse: Secondary | ICD-10-CM | POA: Diagnosis not present

## 2016-11-24 DIAGNOSIS — M069 Rheumatoid arthritis, unspecified: Secondary | ICD-10-CM | POA: Diagnosis not present

## 2016-11-24 DIAGNOSIS — Z510 Encounter for antineoplastic radiation therapy: Secondary | ICD-10-CM | POA: Diagnosis not present

## 2016-11-24 DIAGNOSIS — N840 Polyp of corpus uteri: Secondary | ICD-10-CM | POA: Diagnosis not present

## 2016-11-24 DIAGNOSIS — Z90722 Acquired absence of ovaries, bilateral: Secondary | ICD-10-CM | POA: Diagnosis not present

## 2016-11-24 DIAGNOSIS — K589 Irritable bowel syndrome without diarrhea: Secondary | ICD-10-CM | POA: Diagnosis not present

## 2016-11-25 ENCOUNTER — Ambulatory Visit
Admission: RE | Admit: 2016-11-25 | Discharge: 2016-11-25 | Disposition: A | Discharge status: Home / Self Care | Payer: Medicare Other | Source: Ambulatory Visit | Attending: Radiation Oncology | Admitting: Radiation Oncology

## 2016-11-25 DIAGNOSIS — N840 Polyp of corpus uteri: Secondary | ICD-10-CM | POA: Diagnosis not present

## 2016-11-25 DIAGNOSIS — Z888 Allergy status to other drugs, medicaments and biological substances status: Secondary | ICD-10-CM | POA: Diagnosis not present

## 2016-11-25 DIAGNOSIS — Z90722 Acquired absence of ovaries, bilateral: Secondary | ICD-10-CM | POA: Diagnosis not present

## 2016-11-25 DIAGNOSIS — I341 Nonrheumatic mitral (valve) prolapse: Secondary | ICD-10-CM | POA: Diagnosis not present

## 2016-11-25 DIAGNOSIS — K589 Irritable bowel syndrome without diarrhea: Secondary | ICD-10-CM | POA: Diagnosis not present

## 2016-11-25 DIAGNOSIS — C569 Malignant neoplasm of unspecified ovary: Secondary | ICD-10-CM | POA: Diagnosis not present

## 2016-11-25 DIAGNOSIS — E7800 Pure hypercholesterolemia, unspecified: Secondary | ICD-10-CM | POA: Diagnosis not present

## 2016-11-25 DIAGNOSIS — Z90710 Acquired absence of both cervix and uterus: Secondary | ICD-10-CM | POA: Diagnosis not present

## 2016-11-25 DIAGNOSIS — Z510 Encounter for antineoplastic radiation therapy: Secondary | ICD-10-CM | POA: Diagnosis not present

## 2016-11-25 DIAGNOSIS — M069 Rheumatoid arthritis, unspecified: Secondary | ICD-10-CM | POA: Diagnosis not present

## 2016-11-26 ENCOUNTER — Ambulatory Visit
Admission: RE | Admit: 2016-11-26 | Discharge: 2016-11-26 | Disposition: A | Discharge status: Home / Self Care | Payer: Medicare Other | Source: Ambulatory Visit | Attending: Radiation Oncology | Admitting: Radiation Oncology

## 2016-11-26 DIAGNOSIS — Z90722 Acquired absence of ovaries, bilateral: Secondary | ICD-10-CM | POA: Diagnosis not present

## 2016-11-26 DIAGNOSIS — E7800 Pure hypercholesterolemia, unspecified: Secondary | ICD-10-CM | POA: Diagnosis not present

## 2016-11-26 DIAGNOSIS — C569 Malignant neoplasm of unspecified ovary: Secondary | ICD-10-CM | POA: Diagnosis not present

## 2016-11-26 DIAGNOSIS — M069 Rheumatoid arthritis, unspecified: Secondary | ICD-10-CM | POA: Diagnosis not present

## 2016-11-26 DIAGNOSIS — K589 Irritable bowel syndrome without diarrhea: Secondary | ICD-10-CM | POA: Diagnosis not present

## 2016-11-26 DIAGNOSIS — N840 Polyp of corpus uteri: Secondary | ICD-10-CM | POA: Diagnosis not present

## 2016-11-26 DIAGNOSIS — Z888 Allergy status to other drugs, medicaments and biological substances status: Secondary | ICD-10-CM | POA: Diagnosis not present

## 2016-11-26 DIAGNOSIS — I341 Nonrheumatic mitral (valve) prolapse: Secondary | ICD-10-CM | POA: Diagnosis not present

## 2016-11-26 DIAGNOSIS — Z90710 Acquired absence of both cervix and uterus: Secondary | ICD-10-CM | POA: Diagnosis not present

## 2016-11-26 DIAGNOSIS — Z510 Encounter for antineoplastic radiation therapy: Secondary | ICD-10-CM | POA: Diagnosis not present

## 2016-11-29 ENCOUNTER — Ambulatory Visit
Admission: RE | Admit: 2016-11-29 | Discharge: 2016-11-29 | Disposition: A | Discharge status: Home / Self Care | Payer: Medicare Other | Source: Ambulatory Visit | Attending: Radiation Oncology | Admitting: Radiation Oncology

## 2016-11-29 ENCOUNTER — Ambulatory Visit: Payer: Medicare Other

## 2016-11-29 DIAGNOSIS — N840 Polyp of corpus uteri: Secondary | ICD-10-CM | POA: Diagnosis not present

## 2016-11-29 DIAGNOSIS — Z90710 Acquired absence of both cervix and uterus: Secondary | ICD-10-CM | POA: Diagnosis not present

## 2016-11-29 DIAGNOSIS — C569 Malignant neoplasm of unspecified ovary: Secondary | ICD-10-CM | POA: Diagnosis not present

## 2016-11-29 DIAGNOSIS — Z90722 Acquired absence of ovaries, bilateral: Secondary | ICD-10-CM | POA: Diagnosis not present

## 2016-11-29 DIAGNOSIS — I341 Nonrheumatic mitral (valve) prolapse: Secondary | ICD-10-CM | POA: Diagnosis not present

## 2016-11-29 DIAGNOSIS — M069 Rheumatoid arthritis, unspecified: Secondary | ICD-10-CM | POA: Diagnosis not present

## 2016-11-29 DIAGNOSIS — Z510 Encounter for antineoplastic radiation therapy: Secondary | ICD-10-CM | POA: Diagnosis not present

## 2016-11-29 DIAGNOSIS — K589 Irritable bowel syndrome without diarrhea: Secondary | ICD-10-CM | POA: Diagnosis not present

## 2016-11-29 DIAGNOSIS — Z888 Allergy status to other drugs, medicaments and biological substances status: Secondary | ICD-10-CM | POA: Diagnosis not present

## 2016-11-29 DIAGNOSIS — E7800 Pure hypercholesterolemia, unspecified: Secondary | ICD-10-CM | POA: Diagnosis not present

## 2016-11-30 ENCOUNTER — Ambulatory Visit
Admission: RE | Admit: 2016-11-30 | Discharge: 2016-11-30 | Disposition: A | Discharge status: Home / Self Care | Payer: Medicare Other | Source: Ambulatory Visit | Attending: Radiation Oncology | Admitting: Radiation Oncology

## 2016-11-30 ENCOUNTER — Encounter: Payer: Self-pay | Admitting: Radiation Oncology

## 2016-11-30 VITALS — BP 105/74 | HR 91 | Temp 98.1°F | Resp 18 | Ht 65.0 in | Wt 143.8 lb

## 2016-11-30 DIAGNOSIS — Z510 Encounter for antineoplastic radiation therapy: Secondary | ICD-10-CM | POA: Diagnosis not present

## 2016-11-30 DIAGNOSIS — N840 Polyp of corpus uteri: Secondary | ICD-10-CM | POA: Diagnosis not present

## 2016-11-30 DIAGNOSIS — Z888 Allergy status to other drugs, medicaments and biological substances status: Secondary | ICD-10-CM | POA: Diagnosis not present

## 2016-11-30 DIAGNOSIS — I341 Nonrheumatic mitral (valve) prolapse: Secondary | ICD-10-CM | POA: Diagnosis not present

## 2016-11-30 DIAGNOSIS — C569 Malignant neoplasm of unspecified ovary: Secondary | ICD-10-CM | POA: Diagnosis not present

## 2016-11-30 DIAGNOSIS — Z90722 Acquired absence of ovaries, bilateral: Secondary | ICD-10-CM | POA: Diagnosis not present

## 2016-11-30 DIAGNOSIS — K589 Irritable bowel syndrome without diarrhea: Secondary | ICD-10-CM | POA: Diagnosis not present

## 2016-11-30 DIAGNOSIS — M069 Rheumatoid arthritis, unspecified: Secondary | ICD-10-CM | POA: Diagnosis not present

## 2016-11-30 DIAGNOSIS — Z90710 Acquired absence of both cervix and uterus: Secondary | ICD-10-CM | POA: Diagnosis not present

## 2016-11-30 DIAGNOSIS — E7800 Pure hypercholesterolemia, unspecified: Secondary | ICD-10-CM | POA: Diagnosis not present

## 2016-11-30 NOTE — Progress Notes (Signed)
[name] [name] has completed 27 fractions to her pelvis.  She reports having burning in both sides occasionally.  Denies having any odor on urination or vaginal discharge.    She denies having diarrhea.  She reports having fatigue. Appetite is good.  EOT one month card given to see Dr. Sondra Come and will call if she has any questions prior to her follow up appointment date. Wt Readings from Last 3 Encounters:  11/30/16 143 lb 12.8 oz (65.2 kg)  11/23/16 144 lb 9.6 oz (65.6 kg)  11/16/16 144 lb 6.4 oz (65.5 kg)  BP 105/74   Pulse 91   Temp 98.1 F (36.7 C) (Oral)   Resp 18   Ht 5\' 5"  (1.651 m)   Wt 143 lb 12.8 oz (65.2 kg)   SpO2 97%   BMI 23.93 kg/m

## 2016-11-30 NOTE — Progress Notes (Signed)
  Radiation Oncology         [phone] ________________________________  Name: [name] [name] MRN: [id number]  Date: 11/30/2016  DOB: [birth year]  Weekly Radiation Therapy Management    ICD-9-CM ICD-10-CM   1. Malignant neoplasm of ovary, unspecified laterality (HCC) 183.0 C56.9      Current Dose: 58.05 Gy     Planned Dose:  60.2 Gy  Narrative . . . . . . . . The patient presents for routine under treatment assessment.                                  The patient has completed 27 fractions to her pelvis. She reports burning on both sides occasionally. Denies having any odor on urination or vaginal discharge. She denies diarrhea. She reports having fatigue and has a good appetite.                                  Set-up films were reviewed.                                 The chart was checked. Physical Findings. . .  height is 5\' 5"  (1.651 m) and weight is 143 lb 12.8 oz (65.2 kg). Her oral temperature is 98.1 F (36.7 C). Her blood pressure is 105/74 and her pulse is 91. Her respiration is 18 and oxygen saturation is 97%.  Weight essentially stable. Lungs are clear to auscultation bilaterally. Heart has regular rate and rhythm. Abdomen soft, non-tender, normal bowel sounds. Impression . . . . . . . The patient is tolerating radiation. Plan . . . . . . . . . . . . Continue treatment as planned. The patient was given a 1 month f/u appointment card. ________________________________   Blair Promise, PhD, MD  This document serves as a record of services personally performed by Gery Pray, MD. It was created on his behalf by Darcus Austin, a trained medical scribe. The creation of this record is based on the scribe's personal observations and the provider's statements to them. This document has been checked and approved by the attending provider.

## 2016-12-01 ENCOUNTER — Ambulatory Visit
Admission: RE | Admit: 2016-12-01 | Discharge: 2016-12-01 | Disposition: A | Discharge status: Home / Self Care | Payer: Medicare Other | Source: Ambulatory Visit | Attending: Radiation Oncology | Admitting: Radiation Oncology

## 2016-12-01 DIAGNOSIS — Z510 Encounter for antineoplastic radiation therapy: Secondary | ICD-10-CM | POA: Diagnosis not present

## 2016-12-01 DIAGNOSIS — I341 Nonrheumatic mitral (valve) prolapse: Secondary | ICD-10-CM | POA: Diagnosis not present

## 2016-12-01 DIAGNOSIS — Z90710 Acquired absence of both cervix and uterus: Secondary | ICD-10-CM | POA: Diagnosis not present

## 2016-12-01 DIAGNOSIS — M069 Rheumatoid arthritis, unspecified: Secondary | ICD-10-CM | POA: Diagnosis not present

## 2016-12-01 DIAGNOSIS — C569 Malignant neoplasm of unspecified ovary: Secondary | ICD-10-CM | POA: Diagnosis not present

## 2016-12-01 DIAGNOSIS — E7800 Pure hypercholesterolemia, unspecified: Secondary | ICD-10-CM | POA: Diagnosis not present

## 2016-12-01 DIAGNOSIS — Z888 Allergy status to other drugs, medicaments and biological substances status: Secondary | ICD-10-CM | POA: Diagnosis not present

## 2016-12-01 DIAGNOSIS — Z90722 Acquired absence of ovaries, bilateral: Secondary | ICD-10-CM | POA: Diagnosis not present

## 2016-12-01 DIAGNOSIS — K589 Irritable bowel syndrome without diarrhea: Secondary | ICD-10-CM | POA: Diagnosis not present

## 2016-12-01 DIAGNOSIS — N840 Polyp of corpus uteri: Secondary | ICD-10-CM | POA: Diagnosis not present

## 2016-12-06 ENCOUNTER — Other Ambulatory Visit: Payer: Self-pay | Admitting: Gynecology

## 2016-12-06 DIAGNOSIS — Z1231 Encounter for screening mammogram for malignant neoplasm of breast: Secondary | ICD-10-CM

## 2016-12-09 ENCOUNTER — Encounter: Payer: Self-pay | Admitting: Radiation Oncology

## 2016-12-09 NOTE — Progress Notes (Signed)
  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 During treatment, the patient complained of occasional burning on both sides of her pelvis, and fatigue.  No reports of nausea or diarrhea  Plan: The patient has completed radiation treatment. The patient will return to radiation oncology clinic for routine followup in one month. I advised them to call or return sooner if they have any questions or concerns related to their recovery or treatment.  -----------------------------------  Blair Promise, PhD, MD  This document serves as a record of services personally performed by Gery Pray, MD. It was created on his behalf by Bethann Humble, a trained medical scribe. The creation of this record is based on the scribe's personal observations and the provider's statements to them. This document has been checked and approved by the attending provider.

## 2016-12-30 ENCOUNTER — Ambulatory Visit
Admission: RE | Admit: 2016-12-30 | Discharge: 2016-12-30 | Disposition: A | Discharge status: Home / Self Care | Payer: Medicare Other | Source: Ambulatory Visit | Attending: Radiation Oncology | Admitting: Radiation Oncology

## 2016-12-30 ENCOUNTER — Other Ambulatory Visit: Payer: Self-pay

## 2016-12-30 VITALS — BP 115/75 | HR 76 | Temp 98.3°F | Resp 18 | Wt 143.0 lb

## 2016-12-30 DIAGNOSIS — Z888 Allergy status to other drugs, medicaments and biological substances status: Secondary | ICD-10-CM | POA: Diagnosis not present

## 2016-12-30 DIAGNOSIS — Z79899 Other long term (current) drug therapy: Secondary | ICD-10-CM | POA: Insufficient documentation

## 2016-12-30 DIAGNOSIS — Z9221 Personal history of antineoplastic chemotherapy: Secondary | ICD-10-CM | POA: Diagnosis not present

## 2016-12-30 DIAGNOSIS — Z90722 Acquired absence of ovaries, bilateral: Secondary | ICD-10-CM | POA: Insufficient documentation

## 2016-12-30 DIAGNOSIS — C569 Malignant neoplasm of unspecified ovary: Secondary | ICD-10-CM | POA: Diagnosis not present

## 2016-12-30 DIAGNOSIS — M25552 Pain in left hip: Secondary | ICD-10-CM | POA: Diagnosis not present

## 2016-12-30 DIAGNOSIS — Z90710 Acquired absence of both cervix and uterus: Secondary | ICD-10-CM | POA: Insufficient documentation

## 2016-12-30 DIAGNOSIS — Z923 Personal history of irradiation: Secondary | ICD-10-CM | POA: Diagnosis not present

## 2016-12-30 DIAGNOSIS — M549 Dorsalgia, unspecified: Secondary | ICD-10-CM | POA: Insufficient documentation

## 2016-12-30 NOTE — Progress Notes (Signed)
Radiation Oncology         (336) [phone] ________________________________  Name: [name] [name] MRN: [id number]  Date: 12/30/2016  DOB: Dec 19, 1950  Follow-Up Visit Note  CC: Webb Silversmith, NP  Marti Sleigh    ICD-9-CM ICD-10-CM   1. 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  On review of systems, the patient states that prior to radiation she was having pain to right hip/pelvic area and since radiation started she is having pain in left hip/pelvic/back pain, which she rates 3/10 in severity.  Patient states that her appetite is fine. Patient states she is fatigued half the time. Patient states she is experiencing some depression and is seeing a psychologist for this; she was prescribed some recent medication for "family issues".  Patient complains of diarrhea and constipation, and states she has some weeks of normal bowel movements but fluctuates between constipation and diarrhea. Patient takes stool softeners for constipation. Denies hematuria or hematochezia. Patient does claim she has a foul smell with urination and had a negative urine culture recently. Patient denies any vaginal bleeding.    ALLERGIES:  is allergic to atorvastatin; macrodantin; and decongest-aid [pseudoephedrine].  Meds: Current Outpatient Prescriptions  Medication Sig Dispense Refill  . docusate sodium (COLACE) 100 MG capsule Take 100 mg by mouth daily as needed.     . estazolam (PROSOM) 2 MG tablet Take 2  mg by mouth at bedtime.    . fish oil-omega-3 fatty acids 1000 MG capsule Take 1 g by mouth daily.      . hydrOXYzine (ATARAX/VISTARIL) 10 MG tablet Take 10 mg by mouth 3 (three) times daily as needed.    [name] LORazepam (ATIVAN) 0.5 MG tablet Take 0.5 mg by mouth daily.    . Multiple Vitamin (MULTIVITAMIN WITH MINERALS) TABS Take 1 tablet by mouth daily.    . Multiple Vitamin (MULTIVITAMIN) capsule Take 1 capsule by mouth daily.    . polyethylene glycol (MIRALAX / GLYCOLAX) packet Take 17 g by mouth daily.    . pravastatin (PRAVACHOL) 40 MG tablet Take by mouth.     No current facility-administered medications for this encounter.     Physical Findings: The patient is in no acute distress. Patient is alert and oriented.  weight is 143 lb (64.9 kg). Her oral temperature is 98.3 F (36.8 C). Her blood pressure is 115/75 and her pulse is 76. Her respiration is 18 and oxygen saturation is 98%. No significant changes. Oral cavity is clear. Lungs clear to auscultation bilaterally. Heart regular in rate and rhythm. No cervical or supraclavicular adenopathy.  Lab Findings: Lab Results  Component Value Date   WBC 5.6 09/07/2016   HGB 15.2 (H) 09/07/2016   HCT 45.3 09/07/2016   MCV 97.4 09/07/2016   PLT 251 09/07/2016    Radiographic Findings: No results found.  Impression:  The patient is recovering from the effects of radiation. No evidence of disease recurrence on physical exam.   Plan:  She will follow up with Dr. Fermin Schwab in 2 months, And likely have  CT scans at that time; follow up with me in 5 months.  -----------------------------------  Blair Promise, PhD, MD  This document serves as a record of services personally performed by Gery Pray, MD. It was created on his behalf by Maryla Morrow, a trained medical scribe. The creation of this record is based on the scribe's personal observations and the provider's statements to them. This document has been checked and approved  by the attending provider.

## 2016-12-30 NOTE — Progress Notes (Signed)
[name] [name] is here today for 1 month follow up for radiation to right external iliac nodal chain.  Patient states that prior to radiation was having pain to right hip/pelvic area and since radiation started having pain in left hip/pelvic/back pain.  Patient rates pain 3 out of 10.  Patient states that her appetite is fine.  Patient states she is fatigue half the time.  Patient states she is experiencing some depression and is seeing a psychologist for this and was prescribed some recent medication for family issues.  Patient complains of diarrhea and constipation, states she has some weeks of normal bowel movements but fluctuates between constipation and diarrhea.  Patient takes stool softeners for constipation.  Denies any bleeding in urination/bowel movements.  Patient does claim she has a foul smell with urination and it was checked last time and was fine.  Patient denies any vaginal bleeding.   Vitals:   12/30/16 1052  BP: 115/75  Pulse: 76  Resp: 18  Temp: 98.3 F (36.8 C)  TempSrc: Oral  SpO2: 98%  Weight: 143 lb (64.9 kg)    Wt Readings from Last 3 Encounters:  12/30/16 143 lb (64.9 kg)  11/30/16 143 lb 12.8 oz (65.2 kg)  11/23/16 144 lb 9.6 oz (65.6 kg)

## 2017-01-04 ENCOUNTER — Ambulatory Visit
Admission: RE | Admit: 2017-01-04 | Discharge: 2017-01-04 | Disposition: A | Discharge status: Home / Self Care | Payer: Medicare Other | Source: Ambulatory Visit | Attending: Gynecology | Admitting: Gynecology

## 2017-01-04 DIAGNOSIS — Z1231 Encounter for screening mammogram for malignant neoplasm of breast: Secondary | ICD-10-CM

## 2017-02-12 ENCOUNTER — Other Ambulatory Visit: Payer: Self-pay | Admitting: Internal Medicine

## 2017-02-14 ENCOUNTER — Other Ambulatory Visit (HOSPITAL_BASED_OUTPATIENT_CLINIC_OR_DEPARTMENT_OTHER): Payer: Medicare Other

## 2017-02-14 DIAGNOSIS — C569 Malignant neoplasm of unspecified ovary: Secondary | ICD-10-CM

## 2017-02-14 LAB — BASIC METABOLIC PANEL
ANION GAP: 9 meq/L (ref 3–11)
BUN: 23.4 mg/dL (ref 7.0–26.0)
CALCIUM: 9.5 mg/dL (ref 8.4–10.4)
CO2: 25 mEq/L (ref 22–29)
CREATININE: 1.1 mg/dL (ref 0.6–1.1)
Chloride: 108 mEq/L (ref 98–109)
EGFR: 53 mL/min/{1.73_m2} — ABNORMAL LOW (ref >90)
Glucose: 118 mg/dl (ref 70–140)
POTASSIUM: 4.2 meq/L (ref 3.5–5.1)
Sodium: 141 mEq/L (ref 136–145)

## 2017-02-15 LAB — CA 125: CANCER ANTIGEN (CA) 125: 7.1 U/mL (ref 0.0–38.1)

## 2017-02-18 ENCOUNTER — Ambulatory Visit: Payer: Medicare Other | Admitting: Gynecology

## 2017-02-18 ENCOUNTER — Ambulatory Visit (HOSPITAL_BASED_OUTPATIENT_CLINIC_OR_DEPARTMENT_OTHER): Payer: Medicare Other

## 2017-02-18 ENCOUNTER — Ambulatory Visit: Payer: Medicare Other | Attending: Gynecology | Admitting: Gynecology

## 2017-02-18 VITALS — BP 116/68 | HR 65 | Temp 98.3°F | Resp 18 | Wt 145.3 lb

## 2017-02-18 DIAGNOSIS — Z90710 Acquired absence of both cervix and uterus: Secondary | ICD-10-CM

## 2017-02-18 DIAGNOSIS — C569 Malignant neoplasm of unspecified ovary: Secondary | ICD-10-CM | POA: Diagnosis not present

## 2017-02-18 DIAGNOSIS — R8290 Unspecified abnormal findings in urine: Secondary | ICD-10-CM | POA: Diagnosis not present

## 2017-02-18 DIAGNOSIS — C775 Secondary and unspecified malignant neoplasm of intrapelvic lymph nodes: Secondary | ICD-10-CM

## 2017-02-18 DIAGNOSIS — Z923 Personal history of irradiation: Secondary | ICD-10-CM

## 2017-02-18 DIAGNOSIS — Z9221 Personal history of antineoplastic chemotherapy: Secondary | ICD-10-CM | POA: Diagnosis not present

## 2017-02-18 LAB — URINALYSIS, MICROSCOPIC - CHCC
BILIRUBIN (URINE): NEGATIVE
BLOOD: NEGATIVE
Glucose: NEGATIVE mg/dL
Ketones: NEGATIVE mg/dL
Nitrite: NEGATIVE
Protein: NEGATIVE mg/dL
RBC / HPF: NEGATIVE (ref 0–2)
SPECIFIC GRAVITY, URINE: 1.01 (ref 1.003–1.035)
Urobilinogen, UR: 0.2 mg/dL (ref 0.2–1)
pH: 6 (ref 4.6–8.0)

## 2017-02-18 NOTE — Addendum Note (Signed)
Addended by: Joylene John D on: 02/18/2017 04:09 PM   Modules accepted: Orders

## 2017-02-18 NOTE — Patient Instructions (Addendum)
You will be called with the appointment for the PET Scan with in the next 2 weeks.  It will be in early July.  You will see Dr. Aldean Ast on July 13,2018 at 3:45 pm to review scan results.

## 2017-02-18 NOTE — Progress Notes (Signed)
Consult Note: Gyn-Onc   [name] [name] 66 y.o. female  No chief complaint on file.   Assessment: stage IIIc ovarian cancer (optimally debulked). Status post 6 cycles of carboplatin and Taxol given in a dose dense regimencompleted May 2014.  Pelvic lymph node recurrence in 2018 treated with radiation therapy completed 12/01/2016.  Plan the patient's treatment was reviewed with her as well as her recent CA-125 value (7 units per mL) we will plan on repeating a PET scan an approximate 6-8 weeks for reassessment of her disease status.  Interval history: .  Patient returns today for continuing follow-up having completed whole pelvis radiation therapy for very 28th 2018 treating recurrent disease and pelvic lymph nodes. She tolerated the radiation therapy reasonably well. She had no GI or significant GU symptoms. She did have some "burning" the specimen the left lower quadrant. Those symptoms are now resolving. Otherwise she is doing well. She denies any GI GU or pelvic symptoms. Functional status is good.   [name] is a 66 year old gravida 2 para 2 who went through menopause at the age about 66 years of age. She never took any hormone replacement therapy. She states that for the past 6 months she's experienced some pelvic soreness in pressure. She has noted some increasing urinary frequency. With these symptoms she had a PET scan on October 9. It revealed significant soft tissue thickening seen throughout the greater omentum suspicious for intraperitoneal carcinoma. There is minimal ascites in the pelvis. it revealed the areas of omental and peritoneal nodularity were hypermetabolic. There is a dominant pelvic omental mass measuring 10.4 x 3.3 cm. There is a small omental implants in the left side of the abdomen measuring 1.1 cm. There were multiple pelvic foci That wereperitoneal based And hypermetabolic. These included the cul-de-sac. She has small volume of ascites. She underwent a CT-guided  biopsy on July 24, 2012. It revealed metastatic carcinoma. By immunohistochemistry, malignant cells were positive for WT 1, cytokeratin 7, estrogen receptor. There were negative for TTF-1, progesterone receptor, cytokeratin 20, CEA, CD X2, and S100. Overall, the histologic features were highly suggestive of a primary gynecologic origin. Her CA 125 on October 10 was elevated at 69.  She underwent exploratory laparotomy on 08/22/2012 findings of stage IIIC ovarian cancer. Patient was optimally debulked. Debulking, however, required a rectosigmoid resection as well as total omentectomy total nominal hysterectomy bilateral salpingo-oophorectomy. She was optimally debulked with only 5 mm nodules on the right diaphragm as residual disease.  SURGICAL FINDINGS: At exploratory laparotomy the infracolic omentum was nearly replaced with tumor. This measured approximately 12 x 16 cm. There were 5 mm nodules on the right diaphragm. The small bowel serosa and mesentery were normal. In the pelvis it appeared that the left ovary was a primary source. It was cystic and densely adherent to the sigmoid mesentery. The right ovary was approximately 4 cm in diameter and had multiple excrescences on it. There were extensive tumor implants on the bladder flap and involving the sigmoid mesentery and serosa, and posterior cul-de-sac. At the conclusion of the surgical procedure the only residual disease were 5 mm nodules on the right diaphragm. Chemotherapy was initiated in December 2013 using the dose dense regimen of carboplatin and Taxol   She received 6 cycles of carboplatin and Taxol the last administered on 02/06/2013. At that time her CA 125 is 5.6 units per mL.  Patient is found to have recurrent disease in January 2018. It was confined to the right external iliac lymph node  chain and subsequently treated with whole pelvis radiation therapy and a boost to the right pelvic sidewall.  Review of Systems:10 point review of  systems is negative as noted above.   Vitals: Blood pressure 116/68, pulse 65, temperature 98.3 F (36.8 C), resp. rate 18, weight 145 lb 4.8 oz (65.9 kg).  Physical Exam: General : The patient is a healthy woman in no acute distress.  HEENT: normocephalic, extraoccular movements normal; neck is supple without thyromegally  Lynphnodes: Supraclavicular and inguinal nodes not enlarged   Abdomen: Soft, non-tender, no ascites, no organomegally, no masses, no hernias  Pelvic:  EGBUS: Normal female  Vagina: Normal, no lesions no evidence of bleeding. Urethra and Bladder: Normal, non-tender  Cervix: Surgically absent  Uterus: Surgically absent   bi-manual examination:  there is some fullness on the left pelvic sidewall with associated tenderness. This is a new finding.  Rectal: normal sphincter tone, no masses, no blood  Lower extremities: No edema or varicosities. Normal range of motion      Allergies  Allergen Reactions  . Atorvastatin Other (See Comments)    Leg pain  . Macrodantin Other (See Comments)    Unknown Reaction  . Decongest-Aid [Pseudoephedrine] Palpitations    Past Medical History:  Diagnosis Date  . Anxiety and depression    . DJD (degenerative joint disease)   . Elevated cholesterol   . Endometrial polyp   . IRRITABLE BOWEL SYNDROME, HX OF 05/15/2008  . MVP (mitral valve prolapse)    occ palpitations   . Ovarian ca ([place]) 07-2012      . Rheumatoid arthritis(714.0) ~ 2010   dr Ouida Sills  . Shingles     Past Surgical History:  Procedure Laterality Date  . ABDOMINAL HYSTERECTOMY  08/22/2012   Procedure: HYSTERECTOMY ABDOMINAL;  Surgeon: Alvino Chapel, MD;  Location: [facility];  Service: Gynecology;  Laterality: N/A;  . BACK SURGERY  2008   Dr Trenton Gammon  . COLONOSCOPY  04-20-2004  . COLONOSCOPY W/ BIOPSIES  07/2014  . COLOSTOMY REVISION  08/22/2012   Procedure: COLON RESECTION SIGMOID;  Surgeon: Alvino Chapel, MD;  Location: [facility];  Service:  Gynecology;;  Rectal Sigmoid resection and low rectal anastomosis  . CYST ON NECK  2011  . DEBULKING  08/22/2012   Procedure: DEBULKING;  Surgeon: Alvino Chapel, MD;  Location: [facility];  Service: Gynecology;  Laterality: N/A;  Radical tumor debulking, Bilateral Ureterolysis  . DILATION AND CURETTAGE OF UTERUS  2004   WITH HYSTEROSCOPY  . HAND SURGERY  1996  . HYSTEROSCOPY  2004   D&C  . NECK SURGERY  2009   SPURS  . OMENTECTOMY  08/22/2012   Procedure: OMENTECTOMY;  Surgeon: Alvino Chapel, MD;  Location: [facility];  Service: Gynecology;  Laterality: N/A;  . SALPINGOOPHORECTOMY  08/22/2012   Procedure: SALPINGO OOPHORECTOMY;  Surgeon: Alvino Chapel, MD;  Location: [facility];  Service: Gynecology;  Laterality: Bilateral;  . TUBAL LIGATION  1980    Current Outpatient Prescriptions  Medication Sig Dispense Refill  . docusate sodium (COLACE) 100 MG capsule Take 100 mg by mouth daily as needed.     . estazolam (PROSOM) 2 MG tablet Take 2 mg by mouth at bedtime.    . fish oil-omega-3 fatty acids 1000 MG capsule Take 1 g by mouth daily.      . hydrOXYzine (ATARAX/VISTARIL) 10 MG tablet Take 10 mg by mouth 3 (three) times daily as needed.    [name] LORazepam (ATIVAN) 0.5 MG tablet  Take 0.5 mg by mouth daily.    . Multiple Vitamin (MULTIVITAMIN WITH MINERALS) TABS Take 1 tablet by mouth daily.    . Multiple Vitamin (MULTIVITAMIN) capsule Take 1 capsule by mouth daily.    . polyethylene glycol (MIRALAX / GLYCOLAX) packet Take 17 g by mouth daily.    . pravastatin (PRAVACHOL) 40 MG tablet Take by mouth.    . pravastatin (PRAVACHOL) 40 MG tablet Take 1 tablet (40 mg total) by mouth daily. MUST SCHEDULE ANNUAL EXAM FOR FURTHER REFILLS 90 tablet 0   No current facility-administered medications for this visit.     Social History   Social History  . Marital status: Married    Spouse name: N/A  . Number of children: 2  . Years of education: N/A   Occupational History  .  disability d/t RA    Social History Main Topics  . Smoking status: Never Smoker  . Smokeless tobacco: Never Used  . Alcohol use No  . Drug use: No  . Sexual activity: No   Other Topics Concern  . Not on file   Social History Narrative   [name] is her daughter    Household-- pr and husband    Family History  Problem Relation Age of Onset  . Heart disease Mother   . Bladder Cancer Father 59  . Lymphoma Paternal Aunt   . Osteoporosis Sister   . Colon cancer Neg Hx   . Rectal cancer Neg Hx   . Stomach cancer Neg Hx   . Diabetes Neg Hx   . Stroke Neg Hx       Marti Sleigh, MD 02/18/2017, 3:42 PM

## 2017-02-18 NOTE — Addendum Note (Signed)
Addended by: Baruch Merl on: 02/18/2017 04:26 PM   Modules accepted: Orders

## 2017-02-19 LAB — URINE CULTURE: Organism ID, Bacteria: NO GROWTH

## 2017-02-21 ENCOUNTER — Telehealth: Payer: Self-pay

## 2017-02-21 NOTE — Telephone Encounter (Signed)
Told Ms Lampi the results of the urine culture as noted below by Joylene John, NP.

## 2017-02-21 NOTE — Telephone Encounter (Signed)
-----   Message from Dorothyann Gibbs, NP sent at 02/21/2017 10:19 AM EDT ----- No growth on urine culture.  Please let her know no signs of UTI ----- Message ----- From: Interface, [facility] Sent: 02/18/2017   3:41 PM To: Dorothyann Gibbs, NP

## 2017-03-03 ENCOUNTER — Telehealth: Payer: Self-pay | Admitting: *Deleted

## 2017-03-03 NOTE — Telephone Encounter (Signed)
Contacted the patient and gave her the PET scan date/time; along with instructions. Scan scheduled for June 12th at 10am; arrive at 9:30am. Instructed patient nothing to eat or drink after midnight. Patient also given appt on July 13th at 3:45pm, arrive at 3:30 pm with Dr. Fermin Schwab

## 2017-03-16 DIAGNOSIS — F411 Generalized anxiety disorder: Secondary | ICD-10-CM | POA: Diagnosis not present

## 2017-03-28 ENCOUNTER — Encounter (HOSPITAL_COMMUNITY)
Admission: RE | Admit: 2017-03-28 | Discharge: 2017-03-28 | Disposition: A | Discharge status: Home / Self Care | Payer: Medicare Other | Source: Ambulatory Visit | Attending: Gynecologic Oncology | Admitting: Gynecologic Oncology

## 2017-03-28 DIAGNOSIS — C569 Malignant neoplasm of unspecified ovary: Secondary | ICD-10-CM | POA: Diagnosis not present

## 2017-03-28 DIAGNOSIS — N200 Calculus of kidney: Secondary | ICD-10-CM | POA: Diagnosis not present

## 2017-03-28 LAB — GLUCOSE, CAPILLARY: Glucose-Capillary: 91 mg/dL (ref 65–99)

## 2017-03-28 MED ORDER — FLUDEOXYGLUCOSE F - 18 (FDG) INJECTION
6.9000 | Freq: Once | INTRAVENOUS | Status: AC | PRN
  Administered 2017-03-28: 6.9 via INTRAVENOUS

## 2017-03-29 ENCOUNTER — Telehealth: Payer: Self-pay

## 2017-03-29 NOTE — Telephone Encounter (Signed)
Told [name] [name] that the PET scan showed no active cancer. There is a small right kidney stone per [name] John, NP.

## 2017-04-15 ENCOUNTER — Ambulatory Visit: Payer: Medicare Other | Attending: Gynecology | Admitting: Gynecology

## 2017-04-15 ENCOUNTER — Encounter: Payer: Self-pay | Admitting: Gynecology

## 2017-04-15 ENCOUNTER — Ambulatory Visit: Payer: Medicare Other | Admitting: Gynecology

## 2017-04-15 VITALS — BP 103/70 | HR 82 | Temp 97.6°F | Resp 20 | Wt 141.3 lb

## 2017-04-15 DIAGNOSIS — Z9889 Other specified postprocedural states: Secondary | ICD-10-CM | POA: Insufficient documentation

## 2017-04-15 DIAGNOSIS — Z90710 Acquired absence of both cervix and uterus: Secondary | ICD-10-CM | POA: Diagnosis not present

## 2017-04-15 DIAGNOSIS — Z8052 Family history of malignant neoplasm of bladder: Secondary | ICD-10-CM | POA: Diagnosis not present

## 2017-04-15 DIAGNOSIS — F419 Anxiety disorder, unspecified: Secondary | ICD-10-CM | POA: Insufficient documentation

## 2017-04-15 DIAGNOSIS — Z79899 Other long term (current) drug therapy: Secondary | ICD-10-CM | POA: Diagnosis not present

## 2017-04-15 DIAGNOSIS — Z8543 Personal history of malignant neoplasm of ovary: Secondary | ICD-10-CM

## 2017-04-15 DIAGNOSIS — Z923 Personal history of irradiation: Secondary | ICD-10-CM

## 2017-04-15 DIAGNOSIS — C569 Malignant neoplasm of unspecified ovary: Secondary | ICD-10-CM | POA: Diagnosis not present

## 2017-04-15 DIAGNOSIS — Z9851 Tubal ligation status: Secondary | ICD-10-CM | POA: Diagnosis not present

## 2017-04-15 DIAGNOSIS — Z8249 Family history of ischemic heart disease and other diseases of the circulatory system: Secondary | ICD-10-CM | POA: Diagnosis not present

## 2017-04-15 DIAGNOSIS — Z9221 Personal history of antineoplastic chemotherapy: Secondary | ICD-10-CM

## 2017-04-15 DIAGNOSIS — F329 Major depressive disorder, single episode, unspecified: Secondary | ICD-10-CM | POA: Diagnosis not present

## 2017-04-15 NOTE — Progress Notes (Signed)
Consult Note: Gyn-Onc   [name] [name] 66 y.o. female  Chief Complaint  Patient presents with  . Malignant neoplasm of ovary, unspecified laterality ([place])    Assessment: stage IIIc ovarian cancer (optimally debulked). Status post 6 cycles of carboplatin and Taxol given in a dose dense regimencompleted May 2014.  Pelvic lymph node recurrence in 2018 treated with radiation therapy completed 12/01/2016.Follow-up PET scan on 03/25/2017 showed complete response.  Plan  patient returns seem me in 3 months.Given that her tumor markers have not become elevated when she had recurrent disease I would recommend we repeat a CT scan in 6 months..  Interval history: .  Patient returns today for continuing follow-up having completed whole pelvis radiation therapy for very 28th 2018 treating recurrent disease and pelvic lymph nodes. She tolerated the radiation therapy reasonably well. She had no GI or significant GU symptoms.  We repeated a Pap scan on 03/28/2017 that showed resolution of her pelvic sidewall adenopathy (right) prior to radiation therapy the lymph node measured 2.1 cm and now measures 0.6 cm. No other evidence of disease is noted.  [id number] is a 66 year old gravida 2 para 2 who went through menopause at the age about 66 years of age. She never took any hormone replacement therapy. She states that for the past 6 months she's experienced some pelvic soreness in pressure. She has noted some increasing urinary frequency. With these symptoms she had a PET scan on October 9. It revealed significant soft tissue thickening seen throughout the greater omentum suspicious for intraperitoneal carcinoma. There is minimal ascites in the pelvis. it revealed the areas of omental and peritoneal nodularity were hypermetabolic. There is a dominant pelvic omental mass measuring 10.4 x 3.3 cm. There is a small omental implants in the left side of the abdomen measuring 1.1 cm. There were multiple pelvic foci That  wereperitoneal based And hypermetabolic. These included the cul-de-sac. She has small volume of ascites. She underwent a CT-guided biopsy on July 24, 2012. It revealed metastatic carcinoma. By immunohistochemistry, malignant cells were positive for WT 1, cytokeratin 7, estrogen receptor. There were negative for TTF-1, progesterone receptor, cytokeratin 20, CEA, CD X2, and S100. Overall, the histologic features were highly suggestive of a primary gynecologic origin. Her CA 125 on October 10 was elevated at 69.  She underwent exploratory laparotomy on 08/22/2012 findings of stage IIIC ovarian cancer. Patient was optimally debulked. Debulking, however, required a rectosigmoid resection as well as total omentectomy total nominal hysterectomy bilateral salpingo-oophorectomy. She was optimally debulked with only 5 mm nodules on the right diaphragm as residual disease.  SURGICAL FINDINGS: At exploratory laparotomy the infracolic omentum was nearly replaced with tumor. This measured approximately 12 x 16 cm. There were 5 mm nodules on the right diaphragm. The small bowel serosa and mesentery were normal. In the pelvis it appeared that the left ovary was a primary source. It was cystic and densely adherent to the sigmoid mesentery. The right ovary was approximately 4 cm in diameter and had multiple excrescences on it. There were extensive tumor implants on the bladder flap and involving the sigmoid mesentery and serosa, and posterior cul-de-sac. At the conclusion of the surgical procedure the only residual disease were 5 mm nodules on the right diaphragm. Chemotherapy was initiated in December 2013 using the dose dense regimen of carboplatin and Taxol   She received 6 cycles of carboplatin and Taxol the last administered on 02/06/2013. At that time her CA 125 is 5.6 units per mL.  Patient  is found to have recurrent disease in January 2018. It was confined to the right external iliac lymph node chain and  subsequently treated with whole pelvis radiation therapy and a boost to the right pelvic sidewall.  Review of Systems:10 point review of systems is negative as noted above.   Vitals: Blood pressure 103/70, pulse 82, temperature 97.6 F (36.4 C), temperature source Oral, resp. rate 20, weight 141 lb 4.8 oz (64.1 kg), SpO2 97 %.  Physical Exam: General : The patient is a healthy woman in no acute distress.  HEENT: normocephalic, extraoccular movements normal; neck is supple without thyromegally  Lynphnodes: Supraclavicular and inguinal nodes not enlarged   Abdomen: Soft, non-tender, no ascites, no organomegally, no masses, no hernias  Pelvic:  EGBUS: Normal female  Vagina: Normal, no lesions no evidence of bleeding. Urethra and Bladder: Normal, non-tender  Cervix: Surgically absent  Uterus: Surgically absent   bi-manual examination:  there is some fullness on the left pelvic sidewall with associated tenderness. This is a new finding.  Rectal: normal sphincter tone, no masses, no blood  Lower extremities: No edema or varicosities. Normal range of motion      Allergies  Allergen Reactions  . Atorvastatin Other (See Comments)    Leg pain  . Macrodantin Other (See Comments)    Unknown Reaction  . Decongest-Aid [Pseudoephedrine] Palpitations    Past Medical History:  Diagnosis Date  . Anxiety and depression    . DJD (degenerative joint disease)   . Elevated cholesterol   . Endometrial polyp   . IRRITABLE BOWEL SYNDROME, HX OF 05/15/2008  . MVP (mitral valve prolapse)    occ palpitations   . Ovarian ca ([place]) 07-2012      . Rheumatoid arthritis(714.0) ~ 2010   dr Ouida Sills  . Shingles     Past Surgical History:  Procedure Laterality Date  . ABDOMINAL HYSTERECTOMY  08/22/2012   Procedure: HYSTERECTOMY ABDOMINAL;  Surgeon: Alvino Chapel, MD;  Location: [facility];  Service: Gynecology;  Laterality: N/A;  . BACK SURGERY  2008   Dr Trenton Gammon  . COLONOSCOPY  04-20-2004  .  COLONOSCOPY W/ BIOPSIES  07/2014  . COLOSTOMY REVISION  08/22/2012   Procedure: COLON RESECTION SIGMOID;  Surgeon: Alvino Chapel, MD;  Location: [facility];  Service: Gynecology;;  Rectal Sigmoid resection and low rectal anastomosis  . CYST ON NECK  2011  . DEBULKING  08/22/2012   Procedure: DEBULKING;  Surgeon: Alvino Chapel, MD;  Location: [facility];  Service: Gynecology;  Laterality: N/A;  Radical tumor debulking, Bilateral Ureterolysis  . DILATION AND CURETTAGE OF UTERUS  2004   WITH HYSTEROSCOPY  . HAND SURGERY  1996  . HYSTEROSCOPY  2004   D&C  . NECK SURGERY  2009   SPURS  . OMENTECTOMY  08/22/2012   Procedure: OMENTECTOMY;  Surgeon: Alvino Chapel, MD;  Location: [facility];  Service: Gynecology;  Laterality: N/A;  . SALPINGOOPHORECTOMY  08/22/2012   Procedure: SALPINGO OOPHORECTOMY;  Surgeon: Alvino Chapel, MD;  Location: [facility];  Service: Gynecology;  Laterality: Bilateral;  . TUBAL LIGATION  1980    Current Outpatient Prescriptions  Medication Sig Dispense Refill  . docusate sodium (COLACE) 100 MG capsule Take 100 mg by mouth daily as needed.     . estazolam (PROSOM) 2 MG tablet Take 2 mg by mouth at bedtime.    . fish oil-omega-3 fatty acids 1000 MG capsule Take 1 g by mouth daily.      [name]  hydrOXYzine (ATARAX/VISTARIL) 10 MG tablet Take 10 mg by mouth 3 (three) times daily as needed.    [name] LORazepam (ATIVAN) 0.5 MG tablet Take 0.5 mg by mouth daily.    . Multiple Vitamin (MULTIVITAMIN WITH MINERALS) TABS Take 1 tablet by mouth daily.    . Multiple Vitamin (MULTIVITAMIN) capsule Take 1 capsule by mouth daily.    . polyethylene glycol (MIRALAX / GLYCOLAX) packet Take 17 g by mouth daily.    . pravastatin (PRAVACHOL) 40 MG tablet Take by mouth.    . pravastatin (PRAVACHOL) 40 MG tablet Take 1 tablet (40 mg total) by mouth daily. MUST SCHEDULE ANNUAL EXAM FOR FURTHER REFILLS 90 tablet 0   No current facility-administered medications for this visit.      Social History   Social History  . Marital status: Married    Spouse name: N/A  . Number of children: 2  . Years of education: N/A   Occupational History  . disability d/t RA    Social History Main Topics  . Smoking status: Never Smoker  . Smokeless tobacco: Never Used  . Alcohol use No  . Drug use: No  . Sexual activity: No   Other Topics Concern  . Not on file   Social History Narrative   [name] is her daughter    Household-- pr and husband    Family History  Problem Relation Age of Onset  . Heart disease Mother   . Bladder Cancer Father 19  . Lymphoma Paternal Aunt   . Osteoporosis Sister   . Colon cancer Neg Hx   . Rectal cancer Neg Hx   . Stomach cancer Neg Hx   . Diabetes Neg Hx   . Stroke Neg Hx       Marti Sleigh, MD 04/15/2017, 3:01 PM

## 2017-04-15 NOTE — Patient Instructions (Signed)
Follow up with Dr. Fermin Schwab on 07-11-17 as scheduled.

## 2017-04-21 ENCOUNTER — Ambulatory Visit (INDEPENDENT_AMBULATORY_CARE_PROVIDER_SITE_OTHER): Payer: Medicare Other | Admitting: Internal Medicine

## 2017-04-21 ENCOUNTER — Encounter: Payer: Self-pay | Admitting: Internal Medicine

## 2017-04-21 VITALS — BP 108/66 | HR 63 | Temp 98.0°F | Ht 64.0 in | Wt 137.5 lb

## 2017-04-21 DIAGNOSIS — H6123 Impacted cerumen, bilateral: Secondary | ICD-10-CM

## 2017-04-21 DIAGNOSIS — Z0000 Encounter for general adult medical examination without abnormal findings: Secondary | ICD-10-CM

## 2017-04-21 DIAGNOSIS — M069 Rheumatoid arthritis, unspecified: Secondary | ICD-10-CM | POA: Diagnosis not present

## 2017-04-21 DIAGNOSIS — F419 Anxiety disorder, unspecified: Secondary | ICD-10-CM | POA: Diagnosis not present

## 2017-04-21 DIAGNOSIS — F329 Major depressive disorder, single episode, unspecified: Secondary | ICD-10-CM | POA: Diagnosis not present

## 2017-04-21 DIAGNOSIS — C569 Malignant neoplasm of unspecified ovary: Secondary | ICD-10-CM

## 2017-04-21 DIAGNOSIS — N649 Disorder of breast, unspecified: Secondary | ICD-10-CM

## 2017-04-21 DIAGNOSIS — E7800 Pure hypercholesterolemia, unspecified: Secondary | ICD-10-CM

## 2017-04-21 DIAGNOSIS — F32A Depression, unspecified: Secondary | ICD-10-CM

## 2017-04-21 DIAGNOSIS — L988 Other specified disorders of the skin and subcutaneous tissue: Secondary | ICD-10-CM

## 2017-04-21 LAB — COMPREHENSIVE METABOLIC PANEL
ALBUMIN: 4.5 g/dL (ref 3.5–5.2)
ALT: 26 U/L (ref 0–35)
AST: 20 U/L (ref 0–37)
Alkaline Phosphatase: 49 U/L (ref 39–117)
BUN: 21 mg/dL (ref 6–23)
CHLORIDE: 103 meq/L (ref 96–112)
CO2: 29 mEq/L (ref 19–32)
Calcium: 9.8 mg/dL (ref 8.4–10.5)
Creatinine, Ser: 0.84 mg/dL (ref 0.40–1.20)
GFR: 71.79 mL/min (ref >60.00)
GLUCOSE: 91 mg/dL (ref 70–99)
POTASSIUM: 4 meq/L (ref 3.5–5.1)
SODIUM: 140 meq/L (ref 135–145)
Total Bilirubin: 0.8 mg/dL (ref 0.2–1.2)
Total Protein: 7.1 g/dL (ref 6.0–8.3)

## 2017-04-21 LAB — CBC
HEMATOCRIT: 46.9 % — AB (ref 36.0–46.0)
Hemoglobin: 15.4 g/dL — ABNORMAL HIGH (ref 12.0–15.0)
MCHC: 32.9 g/dL (ref 30.0–36.0)
MCV: 100.4 fl — AB (ref 78.0–100.0)
Platelets: 246 10*3/uL (ref 150.0–400.0)
RBC: 4.67 Mil/uL (ref 3.87–5.11)
RDW: 13.2 % (ref 11.5–15.5)
WBC: 4.6 10*3/uL (ref 4.0–10.5)

## 2017-04-21 LAB — LIPID PANEL
CHOLESTEROL: 226 mg/dL — AB (ref 0–200)
HDL: 57.2 mg/dL (ref >39.00)
LDL CALC: 139 mg/dL — AB (ref 0–99)
NonHDL: 168.46
Total CHOL/HDL Ratio: 4
Triglycerides: 145 mg/dL (ref 0.0–149.0)
VLDL: 29 mg/dL (ref 0.0–40.0)

## 2017-04-21 LAB — VITAMIN D 25 HYDROXY (VIT D DEFICIENCY, FRACTURES): VITD: 29.09 ng/mL — AB (ref 30.00–100.00)

## 2017-04-21 NOTE — Addendum Note (Signed)
Addended by: Jearld Fenton on: 04/21/2017 11:17 AM   Modules accepted: Orders

## 2017-04-21 NOTE — Assessment & Plan Note (Signed)
Continue Ibuprofen prn She will continue to follow with Dr. Ouida Sills

## 2017-04-21 NOTE — Progress Notes (Addendum)
HPI:  Pt presents to the clinic today for her Medicare Wellness Exam. She is also due to follow up chronic conditions.  History of Ovarian Cancer: s/p debulking and chemo. She had reoccurance in her pelvic lymph node early 2018, which was treated with radiation. Last PET scan showed resolution. She is following with Dr.Sherrill, note from 04/2017 reviewed.  HLD: Her last LDL was 117, triglycerides 109, 09/2016. She is taking 1/2 tab of Pravastatin and Lovaza as prescribed. She tries to consume a low fat diet.  RA: She reports stiffness. She takes Ibuprofen as needed. She follows with Dr. Ouida Sills.  Anxiety and Depression: Triggered by family stress, especially her relationship with her husband. She is taking Hydroxyzine, Ativan and Prosom as prescribed. She follows with psychiatry, Dr. Casimiro Needle.  Past Medical History:  Diagnosis Date  . Anxiety and depression    . DJD (degenerative joint disease)   . Elevated cholesterol   . Endometrial polyp   . IRRITABLE BOWEL SYNDROME, HX OF 05/15/2008  . MVP (mitral valve prolapse)    occ palpitations   . Ovarian ca ([place]) 07-2012      . Rheumatoid arthritis(714.0) ~ 2010   dr Ouida Sills  . Shingles     Current Outpatient Prescriptions  Medication Sig Dispense Refill  . docusate sodium (COLACE) 100 MG capsule Take 100 mg by mouth daily as needed.     . estazolam (PROSOM) 2 MG tablet Take 2 mg by mouth at bedtime.    . fish oil-omega-3 fatty acids 1000 MG capsule Take 1 g by mouth daily.      . hydrOXYzine (ATARAX/VISTARIL) 10 MG tablet Take 10 mg by mouth 3 (three) times daily as needed.    [name] LORazepam (ATIVAN) 0.5 MG tablet Take 0.5 mg by mouth daily.    . Multiple Vitamin (MULTIVITAMIN WITH MINERALS) TABS Take 1 tablet by mouth daily.    . Multiple Vitamin (MULTIVITAMIN) capsule Take 1 capsule by mouth daily.    . polyethylene glycol (MIRALAX / GLYCOLAX) packet Take 17 g by mouth daily.    . pravastatin (PRAVACHOL) 40 MG tablet Take by mouth.     . pravastatin (PRAVACHOL) 40 MG tablet Take 1 tablet (40 mg total) by mouth daily. MUST SCHEDULE ANNUAL EXAM FOR FURTHER REFILLS 90 tablet 0   No current facility-administered medications for this visit.     Allergies  Allergen Reactions  . Atorvastatin Other (See Comments)    Leg pain  . Macrodantin Other (See Comments)    Unknown Reaction  . Decongest-Aid [Pseudoephedrine] Palpitations    Family History  Problem Relation Age of Onset  . Heart disease Mother   . Bladder Cancer Father 11  . Lymphoma Paternal Aunt   . Osteoporosis Sister   . Colon cancer Neg Hx   . Rectal cancer Neg Hx   . Stomach cancer Neg Hx   . Diabetes Neg Hx   . Stroke Neg Hx     Social History   Social History  . Marital status: Married    Spouse name: N/A  . Number of children: 2  . Years of education: N/A   Occupational History  . disability d/t RA    Social History Main Topics  . Smoking status: Never Smoker  . Smokeless tobacco: Never Used  . Alcohol use No  . Drug use: No  . Sexual activity: No   Other Topics Concern  . Not on file   Social History Narrative   [name] is  her daughter    Household-- pr and husband    Hospitiliaztions: None  Health Maintenance:    Flu: 08/2015  Tetanus: 10/2014  Pneumovax: 10/2014  Prevnar: 03/2016  Zostavax: never  Shingrix: never  Mammogram: 01/2017  Pap Smear: Hysterectomy, under surveillance for ovarian cancer  Bone Density: 03/2016  Colon Screening: 07/2014  Eye Doctor: 07/2016, [place] Exam: as needed   Providers:   PCP: Webb Silversmith, NP-C  Oncologist: Dr. Benay Spice  Psychiatrist: Dr. Ouida Sills  Gastroenterologist: Dr. Fuller Plan    I have personally reviewed and have noted:  1. The patient's medical and social history 2. Their use of alcohol, tobacco or illicit drugs 3. Their current medications and supplements 4. The patient's functional ability including ADL's, fall risks, home safety risks and  hearing or visual impairment. 5. Diet and physical activities 6. Evidence for depression or mood disorder  Subjective:   Review of Systems:   Constitutional: Denies fever, malaise, fatigue, headache or abrupt weight changes.  HEENT: Pt reports wax buildup in ears. Denies eye pain, eye redness, ear pain, ringing in the ears, runny nose, nasal congestion, bloody nose, or sore throat. Respiratory: Denies difficulty breathing, shortness of breath, cough or sputum production.   Cardiovascular: Denies chest pain, chest tightness, palpitations or swelling in the hands or feet.  Gastrointestinal: Pt reports intermittent constipation. Denies abdominal pain, bloating, diarrhea or blood in the stool.  GU: Denies urgency, frequency, pain with urination, burning sensation, blood in urine, odor or discharge. Musculoskeletal: Denies decrease in range of motion, difficulty with gait, muscle pain or joint pain and swelling.  Skin: Pt reports abnormal mole on left breast. Denies redness, rashes, or ulcercations.  Neurological: Denies dizziness, difficulty with memory, difficulty with speech or problems with balance and coordination.  Psych: Pt has history of anxiety and depression. Denies SI/HI.  No other specific complaints in a complete review of systems (except as listed in HPI above).  Objective:  PE:   Blood pressure 108/66, pulse 63, temperature 98 F (36.7 C), temperature source Oral, height 5\' 4"  (1.626 m), weight 137 lb 8 oz (62.4 kg), SpO2 97 %.   Wt Readings from Last 3 Encounters:  04/15/17 141 lb 4.8 oz (64.1 kg)  02/18/17 145 lb 4.8 oz (65.9 kg)  12/30/16 143 lb (64.9 kg)    General: Appears her stated age, well developed, well nourished in NAD. Skin: Round, raised SK or AK noted on medial left breast. It has central darkening which is a little concerning. HEENT: Head: normal shape and size; Eyes: sclera white, no icterus, conjunctiva pink, PERRLA and EOMs intact; Ears: Bilateral  cerumen impaction; Throat/Mouth: Teeth present, mucosa pink and moist, + PND, no exudate, lesions or ulcerations noted.  Neck: Neck supple, trachea midline. No masses, lumps or thyromegaly present.  Cardiovascular: Normal rate and rhythm. S1,S2 noted.  No murmur, rubs or gallops noted. No JVD or BLE edema. No carotid bruits noted. Pulmonary/Chest: Normal effort and positive vesicular breath sounds. No respiratory distress. No wheezes, rales or ronchi noted.  Abdomen: Soft and nontender. Normal bowel sounds. No distention or masses noted.  Musculoskeletal: Bilateral hand deformities noted. Strength 5/5 BUE/BLE. No difficulty with gait.  Neurological: Alert and oriented. Cranial nerves II-XII grossly intact. Coordination normal.  Psychiatric: Mood and affect normal. Behavior is normal. Judgment and thought content normal.    BMET    Component Value Date/Time   NA 141 02/14/2017 1547   K 4.2 02/14/2017 1547   CL 106  09/23/2016 1143   CL 104 01/02/2013 0821   CO2 25 02/14/2017 1547   GLUCOSE 118 02/14/2017 1547   GLUCOSE 86 01/02/2013 0821   BUN 23.4 02/14/2017 1547   CREATININE 1.1 02/14/2017 1547   CALCIUM 9.5 02/14/2017 1547   GFRNONAA >90 08/23/2012 0340   GFRAA >90 08/23/2012 0340    Lipid Panel     Component Value Date/Time   CHOL 188 09/23/2016 1143   TRIG 109.0 09/23/2016 1143   HDL 49.50 09/23/2016 1143   CHOLHDL 4 09/23/2016 1143   VLDL 21.8 09/23/2016 1143   LDLCALC 117 (H) 09/23/2016 1143    CBC    Component Value Date/Time   WBC 5.6 09/07/2016 0716   RBC 4.65 09/07/2016 0716   HGB 15.2 (H) 09/07/2016 0716   HGB 12.8 03/26/2013 0842   HCT 45.3 09/07/2016 0716   HCT 37.4 03/26/2013 0842   PLT 251 09/07/2016 0716   PLT 194 03/26/2013 0842   MCV 97.4 09/07/2016 0716   MCV 111.1 (H) 03/26/2013 0842   MCH 32.7 09/07/2016 0716   MCHC 33.6 09/07/2016 0716   RDW 13.2 09/07/2016 0716   RDW 14.5 03/26/2013 0842   LYMPHSABS 1.5 10/18/2014 1024   LYMPHSABS 1.0  03/26/2013 0842   MONOABS 0.6 10/18/2014 1024   MONOABS 0.5 03/26/2013 0842   EOSABS 0.1 10/18/2014 1024   EOSABS 0.1 03/26/2013 0842   BASOSABS 0.0 10/18/2014 1024   BASOSABS 0.0 03/26/2013 0842    Hgb A1C No results found for: HGBA1C    Assessment and Plan:   Medicare Annual Wellness Visit:  Diet: She does eat meat. She does consume fruits and veggies. She tries to avoid fried foods. She drinks mostly water. Physical activity: None Depression/mood screen: Negative Hearing: Intact to whispered voice Visual acuity: Grossly normal, performs annual eye exam  ADLs: Capable Fall risk: None Home safety: Good Cognitive evaluation: Intact to orientation, naming, recall and repetition EOL planning: Adv directives, full code/ I agree  Preventative Medicine: Encouraged her to get a flu shot in the fall. Tetanus, pneumovax and prevnar UTD. She declines Zostovax or Shingrix at this time. Mammogram, colonoscopy and bone density UTD. She does not need pap smears. Encouraged her to consume a balanced diet and exercise regimen. Advised her to see an eye doctor and dentist annually. Will check CBC, CMET, Lipid and Vit D today.  Bilateral Cerumen Impaction:  Manual lavage by CMA Advised her to try Debrox OTC 2 x week to prevent wax buildup  Skin Lesion of Breast:  Referral placed to dermatology  Next appointment: 1 year, Medicare Wellness Exam   Webb Silversmith, NP

## 2017-04-21 NOTE — Assessment & Plan Note (Signed)
Chronic but stable She will continue to follow with psychiatry

## 2017-04-21 NOTE — Assessment & Plan Note (Signed)
In remission She will continue to follow with GYN ONC

## 2017-04-21 NOTE — Assessment & Plan Note (Signed)
CMET and Lipid profile today Encouraged her to consume a low fat diet Continue Pravastatin and Lovaza

## 2017-04-21 NOTE — Patient Instructions (Signed)
Health Maintenance for Postmenopausal Women Menopause is a normal process in which your reproductive ability comes to an end. This process happens gradually over a span of months to years, usually between the ages of 22 and 9. Menopause is complete when you have missed 12 consecutive menstrual periods. It is important to talk with your health care provider about some of the most common conditions that affect postmenopausal women, such as heart disease, cancer, and bone loss (osteoporosis). Adopting a healthy lifestyle and getting preventive care can help to promote your health and wellness. Those actions can also lower your chances of developing some of these common conditions. What should I know about menopause? During menopause, you may experience a number of symptoms, such as:  Moderate-to-severe hot flashes.  Night sweats.  Decrease in sex drive.  Mood swings.  Headaches.  Tiredness.  Irritability.  Memory problems.  Insomnia.  Choosing to treat or not to treat menopausal changes is an individual decision that you make with your health care provider. What should I know about hormone replacement therapy and supplements? Hormone therapy products are effective for treating symptoms that are associated with menopause, such as hot flashes and night sweats. Hormone replacement carries certain risks, especially as you become older. If you are thinking about using estrogen or estrogen with progestin treatments, discuss the benefits and risks with your health care provider. What should I know about heart disease and stroke? Heart disease, heart attack, and stroke become more likely as you age. This may be due, in part, to the hormonal changes that your body experiences during menopause. These can affect how your body processes dietary fats, triglycerides, and cholesterol. Heart attack and stroke are both medical emergencies. There are many things that you can do to help prevent heart disease  and stroke:  Have your blood pressure checked at least every 1-2 years. High blood pressure causes heart disease and increases the risk of stroke.  If you are 53-22 years old, ask your health care provider if you should take aspirin to prevent a heart attack or a stroke.  Do not use any tobacco products, including cigarettes, chewing tobacco, or electronic cigarettes. If you need help quitting, ask your health care provider.  It is important to eat a healthy diet and maintain a healthy weight. ? Be sure to include plenty of vegetables, fruits, low-fat dairy products, and lean protein. ? Avoid eating foods that are high in solid fats, added sugars, or salt (sodium).  Get regular exercise. This is one of the most important things that you can do for your health. ? Try to exercise for at least 150 minutes each week. The type of exercise that you do should increase your heart rate and make you sweat. This is known as moderate-intensity exercise. ? Try to do strengthening exercises at least twice each week. Do these in addition to the moderate-intensity exercise.  Know your numbers.Ask your health care provider to check your cholesterol and your blood glucose. Continue to have your blood tested as directed by your health care provider.  What should I know about cancer screening? There are several types of cancer. Take the following steps to reduce your risk and to catch any cancer development as early as possible. Breast Cancer  Practice breast self-awareness. ? This means understanding how your breasts normally appear and feel. ? It also means doing regular breast self-exams. Let your health care provider know about any changes, no matter how small.  If you are 40  or older, have a clinician do a breast exam (clinical breast exam or CBE) every year. Depending on your age, family history, and medical history, it may be recommended that you also have a yearly breast X-ray (mammogram).  If you  have a family history of breast cancer, talk with your health care provider about genetic screening.  If you are at high risk for breast cancer, talk with your health care provider about having an MRI and a mammogram every year.  Breast cancer (BRCA) gene test is recommended for women who have family members with BRCA-related cancers. Results of the assessment will determine the need for genetic counseling and BRCA1 and for BRCA2 testing. BRCA-related cancers include these types: ? Breast. This occurs in males or females. ? Ovarian. ? Tubal. This may also be called fallopian tube cancer. ? Cancer of the abdominal or pelvic lining (peritoneal cancer). ? Prostate. ? Pancreatic.  Cervical, Uterine, and Ovarian Cancer Your health care provider may recommend that you be screened regularly for cancer of the pelvic organs. These include your ovaries, uterus, and vagina. This screening involves a pelvic exam, which includes checking for microscopic changes to the surface of your cervix (Pap test).  For women ages 21-65, health care providers may recommend a pelvic exam and a Pap test every three years. For women ages 79-65, they may recommend the Pap test and pelvic exam, combined with testing for human papilloma virus (HPV), every five years. Some types of HPV increase your risk of cervical cancer. Testing for HPV may also be done on women of any age who have unclear Pap test results.  Other health care providers may not recommend any screening for nonpregnant women who are considered low risk for pelvic cancer and have no symptoms. Ask your health care provider if a screening pelvic exam is right for you.  If you have had past treatment for cervical cancer or a condition that could lead to cancer, you need Pap tests and screening for cancer for at least 20 years after your treatment. If Pap tests have been discontinued for you, your risk factors (such as having a new sexual partner) need to be  reassessed to determine if you should start having screenings again. Some women have medical problems that increase the chance of getting cervical cancer. In these cases, your health care provider may recommend that you have screening and Pap tests more often.  If you have a family history of uterine cancer or ovarian cancer, talk with your health care provider about genetic screening.  If you have vaginal bleeding after reaching menopause, tell your health care provider.  There are currently no reliable tests available to screen for ovarian cancer.  Lung Cancer Lung cancer screening is recommended for adults 69-62 years old who are at high risk for lung cancer because of a history of smoking. A yearly low-dose CT scan of the lungs is recommended if you:  Currently smoke.  Have a history of at least 30 pack-years of smoking and you currently smoke or have quit within the past 15 years. A pack-year is smoking an average of one pack of cigarettes per day for one year.  Yearly screening should:  Continue until it has been 15 years since you quit.  Stop if you develop a health problem that would prevent you from having lung cancer treatment.  Colorectal Cancer  This type of cancer can be detected and can often be prevented.  Routine colorectal cancer screening usually begins at  age 42 and continues through age 45.  If you have risk factors for colon cancer, your health care provider may recommend that you be screened at an earlier age.  If you have a family history of colorectal cancer, talk with your health care provider about genetic screening.  Your health care provider may also recommend using home test kits to check for hidden blood in your stool.  A small camera at the end of a tube can be used to examine your colon directly (sigmoidoscopy or colonoscopy). This is done to check for the earliest forms of colorectal cancer.  Direct examination of the colon should be repeated every  5-10 years until age 71. However, if early forms of precancerous polyps or small growths are found or if you have a family history or genetic risk for colorectal cancer, you may need to be screened more often.  Skin Cancer  Check your skin from head to toe regularly.  Monitor any moles. Be sure to tell your health care provider: ? About any new moles or changes in moles, especially if there is a change in a mole's shape or color. ? If you have a mole that is larger than the size of a pencil eraser.  If any of your family members has a history of skin cancer, especially at a young age, talk with your health care provider about genetic screening.  Always use sunscreen. Apply sunscreen liberally and repeatedly throughout the day.  Whenever you are outside, protect yourself by wearing long sleeves, pants, a wide-brimmed hat, and sunglasses.  What should I know about osteoporosis? Osteoporosis is a condition in which bone destruction happens more quickly than new bone creation. After menopause, you may be at an increased risk for osteoporosis. To help prevent osteoporosis or the bone fractures that can happen because of osteoporosis, the following is recommended:  If you are 46-71 years old, get at least 1,000 mg of calcium and at least 600 mg of vitamin D per day.  If you are older than age 55 but younger than age 65, get at least 1,200 mg of calcium and at least 600 mg of vitamin D per day.  If you are older than age 54, get at least 1,200 mg of calcium and at least 800 mg of vitamin D per day.  Smoking and excessive alcohol intake increase the risk of osteoporosis. Eat foods that are rich in calcium and vitamin D, and do weight-bearing exercises several times each week as directed by your health care provider. What should I know about how menopause affects my mental health? Depression may occur at any age, but it is more common as you become older. Common symptoms of depression  include:  Low or sad mood.  Changes in sleep patterns.  Changes in appetite or eating patterns.  Feeling an overall lack of motivation or enjoyment of activities that you previously enjoyed.  Frequent crying spells.  Talk with your health care provider if you think that you are experiencing depression. What should I know about immunizations? It is important that you get and maintain your immunizations. These include:  Tetanus, diphtheria, and pertussis (Tdap) booster vaccine.  Influenza every year before the flu season begins.  Pneumonia vaccine.  Shingles vaccine.  Your health care provider may also recommend other immunizations. This information is not intended to replace advice given to you by your health care provider. Make sure you discuss any questions you have with your health care provider. Document Released: 11/12/2005  Document Revised: 04/09/2016 Document Reviewed: 06/24/2015 Elsevier Interactive Patient Education  2018 [employer]  

## 2017-04-22 MED ORDER — PRAVASTATIN SODIUM 40 MG PO TABS: 40.0000 mg | ORAL_TABLET | Freq: Every day | ORAL | 3 refills | Status: DC

## 2017-04-22 NOTE — Addendum Note (Signed)
Addended by: Lurlean Nanny on: 04/22/2017 04:48 PM   Modules accepted: Orders

## 2017-05-23 ENCOUNTER — Telehealth: Payer: Self-pay | Admitting: Internal Medicine

## 2017-05-23 ENCOUNTER — Telehealth: Payer: Self-pay | Admitting: *Deleted

## 2017-05-23 ENCOUNTER — Telehealth: Payer: Self-pay

## 2017-05-23 NOTE — Telephone Encounter (Signed)
Pt has appt with Dr Lacinda Axon 05/24/17 at 10:30.

## 2017-05-23 NOTE — Telephone Encounter (Signed)
PLEASE NOTE: All timestamps contained within this report are represented as [place] Standard Time. CONFIDENTIALTY NOTICE: This fax transmission is intended only for the addressee. It contains information that is legally privileged, confidential or otherwise protected from use or disclosure. If you are not the intended recipient, you are strictly prohibited from reviewing, disclosing, copying using or disseminating any of this information or taking any action in reliance on or regarding this information. If you have received this fax in error, please notify us immediately by telephone so that we can arrange for its return to [place]. Phone: [phone], Toll-Free: [phone], Fax: [phone] Page: 1 of 2 Call Id: [id number] [place] Patient Name: [name] [name] Gender: Female DOB: 09/20/1951 Age: 66 Y 70 M 4 D Return Phone Number: [phone] (Primary) City/State/Zip: [place] [address] Client [facility] - Client Client Site [facility] - Day Physician Webb Silversmith - NP Who Is Calling Patient / Member / Family / Caregiver Call Type Triage / Clinical Relationship To Patient Self Return Phone Number [phone] (Primary) Chief Complaint Urination Pain Reason for Call Symptomatic / Request for [place] states she thinks she has a UTI. Her urine smells and burns when she urinates. Appointment Disposition EMR Appointment Scheduled Info pasted into Epic No Nurse Assessment Nurse: Joline Salt, RN, Malachy Mood Date/Time [name] Time): 05/23/2017 4:15:35 PM Confirm and document reason for call. If symptomatic, describe symptoms. ---Caller states she thinks she has a UTI. Her urine smells and burns when she urinates. Caller states she went to cancer clinic and they checked it 3 months ago and said it was ok. She had ovarian cancer.  She would like to have it checked. Does the PT have any chronic conditions? (i.e. diabetes, asthma, etc.) ---Yes List chronic conditions. ---ovarian cancer in remission Guidelines Guideline Title Affirmed Question Urination Pain - Female Blood in urine (red, pink, or tea-colored) Disp. Time [name] Time) Disposition Final User 05/23/2017 4:35:01 PM See Physician within 24 Hours Yes Joline Salt, RN, [employer] Referrals REFERRED TO PCP OFFICE REFERRED TO PCP OFFICE Care Advice Given Per Guideline REASSURANCE AND EDUCATION: This could be an urinary tract infection. You should see your PCP to be examined and tested. FLUIDS: Drink extra fluids. Drink 8-10 glasses of liquids a day (Reason: to produce a dilute, non-irritating urine). WARM SALINE SITZ BATHS TO REDUCE PAIN: Sit in a warm saline bath for 20 minutes to cleanse the area and to reduce pain. Add 2 oz. of table salt or baking soda to a tub of water. CALL BACK IF: * Fever or back pain occurs * You become worse. CARE ADVICE given per Urination Pain - Female (Adult) guideline. PLEASE NOTE: All timestamps contained within this report are represented as [place] Standard Time. CONFIDENTIALTY NOTICE: This fax transmission is intended only for the addressee. It contains information that is legally privileged, confidential or otherwise protected from use or disclosure. If you are not the intended recipient, you are strictly prohibited from reviewing, disclosing, copying using or disseminating any of this information or taking any action in reliance on or regarding this information. If you have received this fax in error, please notify us immediately by telephone so that we can arrange for its return to [place]. Phone: [phone], Toll-Free: [phone], Fax: [phone] Page: 2 of 2 Call Id: [id number]

## 2017-05-23 NOTE — Telephone Encounter (Signed)
"  I'm trying to reach someone in Dr. Clabe Seal office, [phone]."

## 2017-05-23 NOTE — Telephone Encounter (Signed)
[facility]  Patient Name: [name] [name]  DOB: 1951/06/19    Initial Comment Caller states she thinks she has a UTI. Her urine smells and burns when she urinates.    Nurse Assessment  Nurse: Joline Salt, RN, Malachy Mood Date/Time [name] Time): 05/23/2017 4:15:35 PM  Confirm and document reason for call. If symptomatic, describe symptoms. ---Caller states she thinks she has a UTI. Her urine smells and burns when she urinates. Caller states she went to cancer clinic and they checked it 3 months ago and said it was ok. She had ovarian cancer. She would like to have it checked.  Does the patient have any new or worsening symptoms? ---Yes  Will a triage be completed? ---Yes  Related visit to physician within the last 2 weeks? ---No  Does the PT have any chronic conditions? (i.e. diabetes, asthma, etc.) ---Yes  List chronic conditions. ---ovarian cancer in remission  Is this a behavioral health or substance abuse call? ---No     Guidelines    Guideline Title Affirmed Question Affirmed Notes  Urination Pain - Female Blood in urine (red, pink, or tea-colored)    Final Disposition User   See Physician within [place], Therapist, sports, [place]    Comments  NOTE: Triage done by Marvene Staff, RN and documentation in to Epic done by [employer], BSN RN --Patient has an appt with Thersa Salt at [facility] in [place]. (office made appt)   Referrals  REFERRED TO PCP OFFICE  REFERRED TO PCP OFFICE   Disagree/Comply: Comply

## 2017-05-24 ENCOUNTER — Telehealth: Payer: Self-pay | Admitting: Oncology

## 2017-05-24 ENCOUNTER — Ambulatory Visit (INDEPENDENT_AMBULATORY_CARE_PROVIDER_SITE_OTHER): Payer: Medicare Other | Admitting: Family Medicine

## 2017-05-24 ENCOUNTER — Encounter: Payer: Self-pay | Admitting: Family Medicine

## 2017-05-24 VITALS — BP 104/76 | HR 69 | Temp 98.2°F | Resp 12 | Wt 139.5 lb

## 2017-05-24 DIAGNOSIS — F419 Anxiety disorder, unspecified: Secondary | ICD-10-CM

## 2017-05-24 DIAGNOSIS — R300 Dysuria: Secondary | ICD-10-CM | POA: Diagnosis not present

## 2017-05-24 DIAGNOSIS — F329 Major depressive disorder, single episode, unspecified: Secondary | ICD-10-CM

## 2017-05-24 DIAGNOSIS — R8290 Unspecified abnormal findings in urine: Secondary | ICD-10-CM | POA: Insufficient documentation

## 2017-05-24 DIAGNOSIS — F32A Depression, unspecified: Secondary | ICD-10-CM

## 2017-05-24 LAB — POC URINALSYSI DIPSTICK (AUTOMATED)
BILIRUBIN UA: NEGATIVE
Blood, UA: NEGATIVE
Glucose, UA: NEGATIVE
Ketones, UA: NEGATIVE
NITRITE UA: NEGATIVE
PH UA: 6 (ref 5.0–8.0)
Protein, UA: NEGATIVE
Spec Grav, UA: 1.015 (ref 1.010–1.025)
Urobilinogen, UA: 0.2 E.U./dL

## 2017-05-24 NOTE — Progress Notes (Signed)
Subjective:  Patient ID: [name] [name], female    DOB: 11/07/1950  Age: 66 y.o. MRN: [id number]  CC: Dark urine with Odor  HPI:  66 year old female presents with the above complaint.  Patient reports that she has had intermittent foul-smelling urine which is dark in coloration for the past several months. She states that it occurs intermittently. She states that she drinks water but feels like she doesn't drink enough. No reports of dysuria. She does state that she has nocturia. No reports of frequency or urgency at this time. No fevers or chills. No abdominal pain. No known exacerbating or relieving factors. No other associated symptoms.  Additionally, patient states that she no longer wants to see her psychiatrist. She is currently taking Ativan for anxiety and is also taking ProSom for sleep. She does not like these medications and feels like they are impacting her cognitive function. She states that she would like to discuss other options regarding treatment of her insomnia as well as anxiety and depression.  Social Hx   Social History   Social History  . Marital status: Married    Spouse name: N/A  . Number of children: 2  . Years of education: N/A   Occupational History  . disability d/t RA    Social History Main Topics  . Smoking status: Never Smoker  . Smokeless tobacco: Never Used  . Alcohol use No  . Drug use: No  . Sexual activity: No   Other Topics Concern  . None   Social History Narrative   [name] is her daughter    Household-- pr and husband    Review of Systems  Genitourinary: Negative for dysuria and urgency.       Nocturia. Dark urine; malodorous.  Psychiatric/Behavioral: The patient is nervous/anxious.    Objective:  BP 104/76 (BP Location: Left Arm, Patient Position: Sitting, Cuff Size: Normal)   Pulse 69   Temp 98.2 F (36.8 C) (Oral)   Resp 12   Wt 139 lb 8 oz (63.3 kg)   SpO2 96%   BMI 23.95 kg/m   BP/Weight 05/24/2017  04/21/2017 06/04/5175  Systolic BP 160 737 106  Diastolic BP 76 66 70  Wt. (Lbs) 139.5 137.5 141.3  BMI 23.95 23.6 23.51    Physical Exam  Constitutional: She is oriented to person, place, and time. She appears well-developed. No distress.  Cardiovascular: Normal rate and regular rhythm.   No murmur heard. Pulmonary/Chest: Effort normal. She has no wheezes. She has no rales.  Abdominal: Soft. She exhibits no distension. There is no tenderness.  Neurological: She is alert and oriented to person, place, and time.  Psychiatric:  Anxious.  Vitals reviewed.   Lab Results  Component Value Date   WBC 4.6 04/21/2017   HGB 15.4 (H) 04/21/2017   HCT 46.9 (H) 04/21/2017   PLT 246.0 04/21/2017   GLUCOSE 91 04/21/2017   CHOL 226 (H) 04/21/2017   TRIG 145.0 04/21/2017   HDL 57.20 04/21/2017   LDLDIRECT 157.5 09/11/2013   LDLCALC 139 (H) 04/21/2017   ALT 26 04/21/2017   AST 20 04/21/2017   NA 140 04/21/2017   K 4.0 04/21/2017   CL 103 04/21/2017   CREATININE 0.84 04/21/2017   BUN 21 04/21/2017   CO2 29 04/21/2017   TSH 2.70 09/11/2013   INR 0.93 09/07/2016   Results for orders placed or performed in visit on 05/24/17 (from the past 24 hour(s))  POCT Urinalysis Dipstick (Automated)  Status: Abnormal   Collection Time: 05/24/17 10:48 AM  Result Value Ref Range   Color, UA yellow    Clarity, UA clear    Glucose, UA negative    Bilirubin, UA negative    Ketones, UA negative    Spec Grav, UA 1.015 1.010 - 1.025   Blood, UA negative    pH, UA 6.0 5.0 - 8.0   Protein, UA negative    Urobilinogen, UA 0.2 0.2 or 1.0 E.U./dL   Nitrite, UA negative    Leukocytes, UA Moderate (2+) (A) Negative    Assessment & Plan:   Problem List Items Addressed This Visit    Anxiety and depression    I advised the patient to follow-up with her PCP did discuss taper of Ativan and start of SSRI or SNRI.      Malodorous urine - Primary    New problem. History not consistent with UTI.  Urinalysis with moderate leukocytes. Sending for culture.      Relevant Orders   POCT Urinalysis Dipstick (Automated) (Completed)   Urine Culture     Follow-up: PRN  [place]

## 2017-05-24 NOTE — Assessment & Plan Note (Signed)
New problem. History not consistent with UTI. Urinalysis with moderate leukocytes. Sending for culture.

## 2017-05-24 NOTE — Patient Instructions (Signed)
We will call with the culture results.  Follow up with [facility].  Take care  Dr. Lacinda Axon

## 2017-05-24 NOTE — Telephone Encounter (Signed)
Patient left a message asking for a return call.  She said she is having symptoms of a UTI with urinary frequency.  She has an appointment with [facility] today to have it checked.

## 2017-05-24 NOTE — Assessment & Plan Note (Signed)
I advised the patient to follow-up with her PCP did discuss taper of Ativan and start of SSRI or SNRI.

## 2017-05-26 ENCOUNTER — Telehealth: Payer: Self-pay

## 2017-05-26 LAB — URINE CULTURE

## 2017-05-26 NOTE — Telephone Encounter (Signed)
Pt left v/m; pt was seen by Dr Lacinda Axon on 05/24/17; pt showed med pt takes that makes pt drowsy; Dr Lacinda Axon advised pt could be addictive and pt understood Dr Lacinda Axon was going to contact Avie Echevaria NP about Lorazepam, Hydroxyzine and estazolam.Pt request cb with Avie Echevaria NP comments.

## 2017-05-27 DIAGNOSIS — L821 Other seborrheic keratosis: Secondary | ICD-10-CM | POA: Diagnosis not present

## 2017-05-27 DIAGNOSIS — D1801 Hemangioma of skin and subcutaneous tissue: Secondary | ICD-10-CM | POA: Diagnosis not present

## 2017-05-27 NOTE — Telephone Encounter (Signed)
Please call her and to set up an appt to discuss her medication concerns.

## 2017-05-30 NOTE — Telephone Encounter (Signed)
I spoke to pt and she states her car is not working right now but I did schedule pt for appt on Thurs just in case... Pt will call to cancel if neccessary

## 2017-06-01 ENCOUNTER — Encounter: Payer: Self-pay | Admitting: Oncology

## 2017-06-01 ENCOUNTER — Ambulatory Visit (INDEPENDENT_AMBULATORY_CARE_PROVIDER_SITE_OTHER): Payer: Medicare Other | Admitting: Internal Medicine

## 2017-06-01 VITALS — BP 108/68 | HR 80 | Temp 98.7°F | Wt 140.0 lb

## 2017-06-01 DIAGNOSIS — F5101 Primary insomnia: Secondary | ICD-10-CM

## 2017-06-01 DIAGNOSIS — F329 Major depressive disorder, single episode, unspecified: Secondary | ICD-10-CM | POA: Diagnosis not present

## 2017-06-01 DIAGNOSIS — F419 Anxiety disorder, unspecified: Secondary | ICD-10-CM | POA: Diagnosis not present

## 2017-06-01 DIAGNOSIS — F32A Depression, unspecified: Secondary | ICD-10-CM

## 2017-06-01 MED ORDER — SERTRALINE HCL 25 MG PO TABS: 25.0000 mg | ORAL_TABLET | Freq: Every day | ORAL | 2 refills | Status: DC

## 2017-06-01 NOTE — Progress Notes (Signed)
Subjective:    Patient ID: [name] [name], female    DOB: 07/13/1951, 66 y.o.   MRN: [id number]  HPI  Pt presents to the clinic today for follow up of insomnia, anxiety and depression.  Insomnia: She has difficulty falling asleep because she can not turn her mind off. She has been taking Melatonin, but She is taking Prosom, prescribed by psychiatry.  Anxiety and Depression: Triggered by stressful relationship with her husband. She is currently taking Ativan. She takes the Ativan 1 x day. She stopped taking the Hydroxyzine. She feels like the Ativan makes her drowsy and impairs her cognitive function. She is seeing psychiatry but reports she no longer wants to follow with them.  Review of Systems      Past Medical History:  Diagnosis Date  . Anxiety and depression    . DJD (degenerative joint disease)   . Elevated cholesterol   . Endometrial polyp   . History of radiation therapy 10/25/16-12/01/16   right pelvis/60.2 Gy in 28 fractions  . IRRITABLE BOWEL SYNDROME, HX OF 05/15/2008  . MVP (mitral valve prolapse)    occ palpitations   . Ovarian ca ([place]) 07-2012      . Rheumatoid arthritis(714.0) ~ 2010   dr Ouida Sills  . Shingles     Current Outpatient Prescriptions  Medication Sig Dispense Refill  . docusate sodium (COLACE) 100 MG capsule Take 100 mg by mouth daily as needed.     . estazolam (PROSOM) 2 MG tablet Take 2 mg by mouth at bedtime.    . fish oil-omega-3 fatty acids 1000 MG capsule Take 1 g by mouth daily.      [name] LORazepam (ATIVAN) 0.5 MG tablet Take 0.5 mg by mouth daily.    . Multiple Vitamin (MULTIVITAMIN WITH MINERALS) TABS Take 1 tablet by mouth daily.    . polyethylene glycol (MIRALAX / GLYCOLAX) packet Take 17 g by mouth daily.    . pravastatin (PRAVACHOL) 40 MG tablet Take 1 tablet (40 mg total) by mouth daily. 90 tablet 3   No current facility-administered medications for this visit.     Allergies  Allergen Reactions  . Atorvastatin Other (See  Comments)    Leg pain  . Macrodantin Other (See Comments)    Unknown Reaction  . Decongest-Aid [Pseudoephedrine] Palpitations    Family History  Problem Relation Age of Onset  . Heart disease Mother   . Bladder Cancer Father 48  . Lymphoma Paternal Aunt   . Osteoporosis Sister   . Colon cancer Neg Hx   . Rectal cancer Neg Hx   . Stomach cancer Neg Hx   . Diabetes Neg Hx   . Stroke Neg Hx     Social History   Social History  . Marital status: Married    Spouse name: N/A  . Number of children: 2  . Years of education: N/A   Occupational History  . disability d/t RA    Social History Main Topics  . Smoking status: Never Smoker  . Smokeless tobacco: Never Used  . Alcohol use No  . Drug use: No  . Sexual activity: No   Other Topics Concern  . Not on file   Social History Narrative   [name] is her daughter    Household-- pr and husband     Constitutional: Denies fever, malaise, fatigue, headache or abrupt weight changes.  Neurological: Denies dizziness, difficulty with memory, difficulty with speech or problems with balance and coordination.  Psych:  Pt reports anxiety. Denies depression, SI/HI.  No other specific complaints in a complete review of systems (except as listed in HPI above).  Objective:   Physical Exam  BP 108/68   Pulse 80   Temp 98.7 F (37.1 C) (Oral)   Wt 140 lb (63.5 kg)   SpO2 98%   BMI 24.03 kg/m  Wt Readings from Last 3 Encounters:  06/01/17 140 lb (63.5 kg)  05/24/17 139 lb 8 oz (63.3 kg)  04/21/17 137 lb 8 oz (62.4 kg)    General: Appears her stated age, well developed, well nourished in NAD. Neurological: Alert and oriented.   Psychiatric: Mood and affect normal. Behavior is normal. Judgment and thought content normal.     BMET    Component Value Date/Time   NA 140 04/21/2017 1149   NA 141 02/14/2017 1547   K 4.0 04/21/2017 1149   K 4.2 02/14/2017 1547   CL 103 04/21/2017 1149   CL 104 01/02/2013 0821    CO2 29 04/21/2017 1149   CO2 25 02/14/2017 1547   GLUCOSE 91 04/21/2017 1149   GLUCOSE 118 02/14/2017 1547   GLUCOSE 86 01/02/2013 0821   BUN 21 04/21/2017 1149   BUN 23.4 02/14/2017 1547   CREATININE 0.84 04/21/2017 1149   CREATININE 1.1 02/14/2017 1547   CALCIUM 9.8 04/21/2017 1149   CALCIUM 9.5 02/14/2017 1547   GFRNONAA >90 08/23/2012 0340   GFRAA >90 08/23/2012 0340    Lipid Panel     Component Value Date/Time   CHOL 226 (H) 04/21/2017 1149   TRIG 145.0 04/21/2017 1149   HDL 57.20 04/21/2017 1149   CHOLHDL 4 04/21/2017 1149   VLDL 29.0 04/21/2017 1149   LDLCALC 139 (H) 04/21/2017 1149    CBC    Component Value Date/Time   WBC 4.6 04/21/2017 1149   RBC 4.67 04/21/2017 1149   HGB 15.4 (H) 04/21/2017 1149   HGB 12.8 03/26/2013 0842   HCT 46.9 (H) 04/21/2017 1149   HCT 37.4 03/26/2013 0842   PLT 246.0 04/21/2017 1149   PLT 194 03/26/2013 0842   MCV 100.4 (H) 04/21/2017 1149   MCV 111.1 (H) 03/26/2013 0842   MCH 32.7 09/07/2016 0716   MCHC 32.9 04/21/2017 1149   RDW 13.2 04/21/2017 1149   RDW 14.5 03/26/2013 0842   LYMPHSABS 1.5 10/18/2014 1024   LYMPHSABS 1.0 03/26/2013 0842   MONOABS 0.6 10/18/2014 1024   MONOABS 0.5 03/26/2013 0842   EOSABS 0.1 10/18/2014 1024   EOSABS 0.1 03/26/2013 0842   BASOSABS 0.0 10/18/2014 1024   BASOSABS 0.0 03/26/2013 0842    Hgb A1C No results found for: HGBA1C          Assessment & Plan:   Insomnia:  Continue to take the Melatonin at night If not effective, use the Prosom as needed  Anxiety and Depression:  Will start Zoloft 25 mg daily After 4 weeks, start weaning your Ativan per the schedule I gave you Stop the Hydroxyzine, do not take this No need to follow with psych  Return precautions discussed Webb Silversmith, NP

## 2017-06-01 NOTE — Patient Instructions (Signed)

## 2017-06-02 ENCOUNTER — Ambulatory Visit
Admission: RE | Admit: 2017-06-02 | Discharge: 2017-06-02 | Disposition: A | Discharge status: Home / Self Care | Payer: Medicare Other | Source: Ambulatory Visit | Attending: Radiation Oncology | Admitting: Radiation Oncology

## 2017-06-02 ENCOUNTER — Encounter: Payer: Self-pay | Admitting: Radiation Oncology

## 2017-06-02 ENCOUNTER — Ambulatory Visit: Payer: Medicare Other | Admitting: Internal Medicine

## 2017-06-02 DIAGNOSIS — C569 Malignant neoplasm of unspecified ovary: Secondary | ICD-10-CM | POA: Diagnosis not present

## 2017-06-02 DIAGNOSIS — Z79899 Other long term (current) drug therapy: Secondary | ICD-10-CM | POA: Insufficient documentation

## 2017-06-02 DIAGNOSIS — Z923 Personal history of irradiation: Secondary | ICD-10-CM | POA: Insufficient documentation

## 2017-06-02 DIAGNOSIS — Z883 Allergy status to other anti-infective agents status: Secondary | ICD-10-CM | POA: Insufficient documentation

## 2017-06-02 DIAGNOSIS — Z90710 Acquired absence of both cervix and uterus: Secondary | ICD-10-CM | POA: Insufficient documentation

## 2017-06-02 DIAGNOSIS — Z9221 Personal history of antineoplastic chemotherapy: Secondary | ICD-10-CM | POA: Diagnosis not present

## 2017-06-02 DIAGNOSIS — Z08 Encounter for follow-up examination after completed treatment for malignant neoplasm: Secondary | ICD-10-CM | POA: Diagnosis not present

## 2017-06-02 DIAGNOSIS — Z8543 Personal history of malignant neoplasm of ovary: Secondary | ICD-10-CM | POA: Diagnosis not present

## 2017-06-02 DIAGNOSIS — Z90722 Acquired absence of ovaries, bilateral: Secondary | ICD-10-CM | POA: Insufficient documentation

## 2017-06-02 DIAGNOSIS — Z888 Allergy status to other drugs, medicaments and biological substances status: Secondary | ICD-10-CM | POA: Diagnosis not present

## 2017-06-02 NOTE — Progress Notes (Signed)
[name] [name] is here for follow up.  She reports having occasional soreness in her right lower abdomen/groin before bowel movements.  She reports that her urine is very yellow and has a bad odor.  She had a urine culture on 05/24/17 and was told it was normal.  She reports having nocturia 3 times a night but said she has always done that.  She denies having any vaginal bleeding.  She reports having fatigue and is not sleeping well.    BP 108/77 (BP Location: Right Arm, Patient Position: Sitting)   Pulse 63   Temp 98.5 F (36.9 C) (Oral)   Ht 5\' 4"  (1.626 m)   Wt 137 lb 6.4 oz (62.3 kg)   SpO2 98%   BMI 23.58 kg/m    Wt Readings from Last 3 Encounters:  06/02/17 137 lb 6.4 oz (62.3 kg)  06/01/17 140 lb (63.5 kg)  05/24/17 139 lb 8 oz (63.3 kg)

## 2017-06-02 NOTE — Progress Notes (Signed)
Radiation Oncology         [phone] ________________________________  Name: [name] [name] MRN: [id number]  Date: 06/02/2017  DOB: Sep 10, 1951  Follow-Up Visit Note  CC: Jearld Fenton, NP  Marti Sleigh    ICD-10-CM   1. 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 Patient presents today with a history of ovarian cancer.  PET on 03/28/2017 revealed no current active malignancy in the right external iliac lymph node. She reports having occasional soreness in her right lower abdomen/groin before bowel movements and reports that her urine is very yellow and has a bad odor. The patient had a urine culture on 05/24/17 and was told it was normal.  She reports having nocturia 3 times a night but said she has always done that.  She denies having any vaginal bleeding.  She reports having fatigue and is not sleeping well.                          ALLERGIES:  is allergic to atorvastatin; macrodantin; and decongest-aid [pseudoephedrine].  Meds: Current Outpatient Prescriptions  Medication Sig Dispense Refill  . estazolam (PROSOM) 2 MG tablet Take 1-2 mg by mouth at bedtime.     . fish oil-omega-3 fatty acids 1000 MG capsule Take 1 g by mouth daily.      [name] LORazepam (ATIVAN) 0.5 MG tablet Take 0.5 mg by mouth daily.    . Multiple Vitamin (MULTIVITAMIN WITH MINERALS) TABS Take 1 tablet by mouth daily.    . pravastatin (PRAVACHOL) 40 MG tablet Take 1 tablet (40 mg total) by mouth daily. 90 tablet 3  . sertraline (ZOLOFT) 25 MG tablet Take 1 tablet (25 mg total) by mouth at bedtime. 30 tablet 2    . docusate sodium (COLACE) 100 MG capsule Take 100 mg by mouth daily as needed.     . hydrOXYzine (ATARAX/VISTARIL) 10 MG tablet Take 10 mg by mouth 3 (three) times daily as needed. Take 2 tablets AM, Take 2 tablets at noon and take 1 tablet at bedtime    . polyethylene glycol (MIRALAX / GLYCOLAX) packet Take 17 g by mouth daily.     No current facility-administered medications for this encounter.     Physical Findings: The patient is in no acute distress. Patient is alert and oriented.  height is 5\' 4"  (1.626 m) and weight is 137 lb 6.4 oz (62.3 kg). Her oral temperature is 98.5 F (36.9 C). Her blood pressure is 108/77 and her pulse is 63. Her oxygen saturation is 98%. .   Lungs are clear to auscultation bilaterally. Heart has regular rate and rhythm. No palpable cervical, supraclavicular, or axillary adenopathy. Abdomen soft, non-tender, normal bowel sounds.  Pelvic exam not performed in light of recent exam by Dr. Fermin Schwab. Some fullness was noted in the left pelvis on pelvic exam at that time.  upcoming exam in early October by him.    Lab Findings: Lab Results  Component Value Date   WBC 4.6 04/21/2017   HGB 15.4 (H) 04/21/2017   HCT 46.9 (H) 04/21/2017   MCV 100.4 (H) 04/21/2017  PLT 246.0 04/21/2017    Radiographic Findings: No results found.  Impression:  . Recent PET scan shows the patient to be in remission.  Plan: The patient will see Dr.Clarke-Pearson in early October and follow-up with radiation oncology in January.   -----------------------------------  Blair Promise, PhD, MD This document serves as a record of services personally performed by Gery Pray, MD. It was created on his behalf by Valeta Harms, a trained medical scribe. The creation of this record is based on the scribe's personal observations and the provider's statements to them. This document has been checked and approved by the attending provider.

## 2017-07-06 ENCOUNTER — Telehealth: Payer: Self-pay

## 2017-07-06 NOTE — Telephone Encounter (Signed)
She is on a very low dose of Zoloft. I would recommend that we increase that to 50 mg to start with.

## 2017-07-06 NOTE — Telephone Encounter (Signed)
Pt left /vm; pt last seen 06/01/17 and last annual 04/21/17. [name] Echevaria NP wanted pt to stop Lorazepam; pt taking Sertraline at night and it is not effective; pt feels like has more anxiety and nervousness at times. Pt wants to know if there is something to take when feels like having an anxiety attack. Pt has eye appt on 07/07/17; best to call in AM.

## 2017-07-07 DIAGNOSIS — H5203 Hypermetropia, bilateral: Secondary | ICD-10-CM | POA: Diagnosis not present

## 2017-07-07 DIAGNOSIS — H524 Presbyopia: Secondary | ICD-10-CM | POA: Diagnosis not present

## 2017-07-07 DIAGNOSIS — H2513 Age-related nuclear cataract, bilateral: Secondary | ICD-10-CM | POA: Diagnosis not present

## 2017-07-07 NOTE — Telephone Encounter (Signed)
Left detailed msg on VM per HIPAA  

## 2017-07-08 MED ORDER — SERTRALINE HCL 50 MG PO TABS: 50.0000 mg | ORAL_TABLET | Freq: Every day | ORAL | 0 refills | Status: DC

## 2017-07-08 NOTE — Telephone Encounter (Signed)
Pt is agreeable to increasing dose... Rx sent through e-scribe

## 2017-07-08 NOTE — Addendum Note (Signed)
Addended by: Lurlean Nanny on: 07/08/2017 08:47 AM   Modules accepted: Orders

## 2017-07-11 ENCOUNTER — Encounter: Payer: Self-pay | Admitting: Gynecology

## 2017-07-11 ENCOUNTER — Ambulatory Visit: Payer: Medicare Other | Attending: Gynecology | Admitting: Gynecology

## 2017-07-11 VITALS — BP 100/59 | HR 74 | Temp 98.3°F | Resp 20 | Ht 64.0 in | Wt 134.1 lb

## 2017-07-11 DIAGNOSIS — Z9221 Personal history of antineoplastic chemotherapy: Secondary | ICD-10-CM | POA: Diagnosis not present

## 2017-07-11 DIAGNOSIS — K589 Irritable bowel syndrome without diarrhea: Secondary | ICD-10-CM | POA: Insufficient documentation

## 2017-07-11 DIAGNOSIS — Z923 Personal history of irradiation: Secondary | ICD-10-CM

## 2017-07-11 DIAGNOSIS — I341 Nonrheumatic mitral (valve) prolapse: Secondary | ICD-10-CM | POA: Diagnosis not present

## 2017-07-11 DIAGNOSIS — R188 Other ascites: Secondary | ICD-10-CM | POA: Insufficient documentation

## 2017-07-11 DIAGNOSIS — Z8543 Personal history of malignant neoplasm of ovary: Secondary | ICD-10-CM

## 2017-07-11 DIAGNOSIS — F419 Anxiety disorder, unspecified: Secondary | ICD-10-CM | POA: Insufficient documentation

## 2017-07-11 DIAGNOSIS — C569 Malignant neoplasm of unspecified ovary: Secondary | ICD-10-CM | POA: Diagnosis not present

## 2017-07-11 DIAGNOSIS — R1031 Right lower quadrant pain: Secondary | ICD-10-CM | POA: Diagnosis not present

## 2017-07-11 DIAGNOSIS — M069 Rheumatoid arthritis, unspecified: Secondary | ICD-10-CM | POA: Diagnosis not present

## 2017-07-11 DIAGNOSIS — Z79899 Other long term (current) drug therapy: Secondary | ICD-10-CM | POA: Insufficient documentation

## 2017-07-11 DIAGNOSIS — E7800 Pure hypercholesterolemia, unspecified: Secondary | ICD-10-CM | POA: Insufficient documentation

## 2017-07-11 DIAGNOSIS — F329 Major depressive disorder, single episode, unspecified: Secondary | ICD-10-CM | POA: Insufficient documentation

## 2017-07-11 NOTE — Patient Instructions (Signed)
Plan on having a PET scan in February 2019 with an appointment to meet with Dr. Fermin Schwab after.  Please call our office at [phone] at the end of Nov or early Dec to schedule your appointment with Dr. Fermin Schwab.  Please call for any questions or concerns or new symptoms.

## 2017-07-11 NOTE — Progress Notes (Signed)
Consult Note: Gyn-Onc   [name] [name] 66 y.o. female  Chief Complaint  Patient presents with  . Malignant neoplasm of ovary, unspecified laterality ([place])    Assessment: stage IIIc ovarian cancer (optimally debulked). Status post 6 cycles of carboplatin and Taxol given in a dose dense regimencompleted May 2014.  Pelvic lymph node recurrence in 2018 treated with radiation therapy completed 12/01/2016.Follow-up PET scan on 03/25/2017 showed complete response.  Plan  patient returns seem me in 3 months.Given that her tumor markers have not become elevated when she had recurrent disease I would recommend we repeat a PET scan in February..  Interval history: .   Patient returns today as previously scheduled. Since her last visit she's done very well. On direct questioning she does have slight amount of right lower quadrant pain which is relieved when she has a bowel movement. Otherwise her appetite is good she has no GI GU or other pelvic symptoms. She's not having any sequelae from her recent radiation therapy. Overall her functional status is good.  YPP:[id number] is a 66 year old gravida 2 para 2 who went through menopause at the age about 66 years of age. She never took any hormone replacement therapy. She states that for the past 6 months she's experienced some pelvic soreness in pressure. She has noted some increasing urinary frequency. With these symptoms she had a PET scan on October 9. It revealed significant soft tissue thickening seen throughout the greater omentum suspicious for intraperitoneal carcinoma. There is minimal ascites in the pelvis. it revealed the areas of omental and peritoneal nodularity were hypermetabolic. There is a dominant pelvic omental mass measuring 10.4 x 3.3 cm. There is a small omental implants in the left side of the abdomen measuring 1.1 cm. There were multiple pelvic foci That wereperitoneal based And hypermetabolic. These included the cul-de-sac. She has small  volume of ascites. She underwent a CT-guided biopsy on July 24, 2012. It revealed metastatic carcinoma. By immunohistochemistry, malignant cells were positive for WT 1, cytokeratin 7, estrogen receptor. There were negative for TTF-1, progesterone receptor, cytokeratin 20, CEA, CD X2, and S100. Overall, the histologic features were highly suggestive of a primary gynecologic origin. Her CA 125 on October 10 was elevated at 69.  She underwent exploratory laparotomy on 08/22/2012 findings of stage IIIC ovarian cancer. Patient was optimally debulked. Debulking, however, required a rectosigmoid resection as well as total omentectomy total nominal hysterectomy bilateral salpingo-oophorectomy. She was optimally debulked with only 5 mm nodules on the right diaphragm as residual disease.  SURGICAL FINDINGS: At exploratory laparotomy the infracolic omentum was nearly replaced with tumor. This measured approximately 12 x 16 cm. There were 5 mm nodules on the right diaphragm. The small bowel serosa and mesentery were normal. In the pelvis it appeared that the left ovary was a primary source. It was cystic and densely adherent to the sigmoid mesentery. The right ovary was approximately 4 cm in diameter and had multiple excrescences on it. There were extensive tumor implants on the bladder flap and involving the sigmoid mesentery and serosa, and posterior cul-de-sac. At the conclusion of the surgical procedure the only residual disease were 5 mm nodules on the right diaphragm. Chemotherapy was initiated in December 2013 using the dose dense regimen of carboplatin and Taxol   She received 6 cycles of carboplatin and Taxol the last administered on 02/06/2013. At that time her CA 125 is 5.6 units per mL.  Patient was found to have recurrent disease in January 2018. It was  confined to the right external iliac lymph node chain and subsequently treated with whole pelvis radiation therapy and a boost to the right pelvic  sidewall.  Review of Systems:10 point review of systems is negative as noted above.   Vitals: Blood pressure (!) 100/59, pulse 74, temperature 98.3 F (36.8 C), resp. rate 20, height 5\' 4"  (1.626 m), weight 134 lb 1.6 oz (60.8 kg), SpO2 96 %.  Physical Exam: General : The patient is a healthy woman in no acute distress.  HEENT: normocephalic, extraoccular movements normal; neck is supple without thyromegally  Lynphnodes: Supraclavicular and inguinal nodes not enlarged   Abdomen: Soft, non-tender, no ascites, no organomegally, no masses, no hernias  Pelvic:  EGBUS: Normal female  Vagina: Normal, no lesions no evidence of bleeding. Urethra and Bladder: Normal, non-tender  Cervix: Surgically absent  Uterus: Surgically absent   bi-manual examination:  there is some fullness on the left pelvic sidewall with associated tenderness. This is a new finding.  Rectal: normal sphincter tone, no masses, no blood  Lower extremities: No edema or varicosities. Normal range of motion      Allergies  Allergen Reactions  . Atorvastatin Other (See Comments)    Leg pain  . Macrodantin Other (See Comments)    Unknown Reaction  . Decongest-Aid [Pseudoephedrine] Palpitations    Past Medical History:  Diagnosis Date  . Anxiety and depression    . DJD (degenerative joint disease)   . Elevated cholesterol   . Endometrial polyp   . History of radiation therapy 10/25/16-12/01/16   right pelvis/60.2 Gy in 28 fractions  . IRRITABLE BOWEL SYNDROME, HX OF 05/15/2008  . MVP (mitral valve prolapse)    occ palpitations   . Ovarian ca ([place]) 07-2012      . Rheumatoid arthritis(714.0) ~ 2010   dr Ouida Sills  . Shingles     Past Surgical History:  Procedure Laterality Date  . ABDOMINAL HYSTERECTOMY  08/22/2012   Procedure: HYSTERECTOMY ABDOMINAL;  Surgeon: Alvino Chapel, MD;  Location: [facility];  Service: Gynecology;  Laterality: N/A;  . BACK SURGERY  2008   Dr Trenton Gammon  . COLONOSCOPY  04-20-2004   . COLONOSCOPY W/ BIOPSIES  07/2014  . COLOSTOMY REVISION  08/22/2012   Procedure: COLON RESECTION SIGMOID;  Surgeon: Alvino Chapel, MD;  [facility]: [facility];  Service: Gynecology;;  Rectal Sigmoid resection and low rectal anastomosis  . CYST ON NECK  2011  . DEBULKING  08/22/2012   Procedure: DEBULKING;  Surgeon: Alvino Chapel, MD;  Location: [facility];  Service: Gynecology;  Laterality: N/A;  Radical tumor debulking, Bilateral Ureterolysis  . DILATION AND CURETTAGE OF UTERUS  2004   WITH HYSTEROSCOPY  . HAND SURGERY  1996  . HYSTEROSCOPY  2004   D&C  . NECK SURGERY  2009   SPURS  . OMENTECTOMY  08/22/2012   Procedure: OMENTECTOMY;  Surgeon: Alvino Chapel, MD;  [facility];  Service: Gynecology;  Laterality: N/A;  . SALPINGOOPHORECTOMY  08/22/2012   Procedure: SALPINGO OOPHORECTOMY;  Surgeon: Alvino Chapel, MD;  [facility]: [facility];  Service: Gynecology;  Laterality: Bilateral;  . TUBAL LIGATION  1980    Current Outpatient Prescriptions  Medication Sig Dispense Refill  . docusate sodium (COLACE) 100 MG capsule Take 100 mg by mouth daily as needed.     . estazolam (PROSOM) 2 MG tablet Take 1-2 mg by mouth at bedtime.     . fish oil-omega-3 fatty acids 1000 MG capsule Take 1 g by  mouth daily.      . Multiple Vitamin (MULTIVITAMIN WITH MINERALS) TABS Take 1 tablet by mouth daily.    . polyethylene glycol (MIRALAX / GLYCOLAX) packet Take 17 g by mouth daily.    . pravastatin (PRAVACHOL) 40 MG tablet Take 1 tablet (40 mg total) by mouth daily. 90 tablet 3  . sertraline (ZOLOFT) 50 MG tablet Take 1 tablet (50 mg total) by mouth daily. 30 tablet 0   No current facility-administered medications for this visit.     Social History   Social History  . Marital status: Married    Spouse name: N/A  . Number of children: 2  . Years of education: N/A   Occupational History  . disability d/t RA    Social History Main Topics  . Smoking status: Never  Smoker  . Smokeless tobacco: Never Used  . Alcohol use No  . Drug use: No  . Sexual activity: No   Other Topics Concern  . Not on file   Social History Narrative   [name] is her daughter    Household-- pr and husband    Family History  Problem Relation Age of Onset  . Heart disease Mother   . Bladder Cancer Father 70  . Lymphoma Paternal Aunt   . Osteoporosis Sister   . Colon cancer Neg Hx   . Rectal cancer Neg Hx   . Stomach cancer Neg Hx   . Diabetes Neg Hx   . Stroke Neg Hx       Marti Sleigh, MD 07/11/2017, 3:27 PM

## 2017-07-27 ENCOUNTER — Telehealth: Payer: Self-pay | Admitting: Internal Medicine

## 2017-07-27 MED ORDER — SERTRALINE HCL 50 MG PO TABS: 50.0000 mg | ORAL_TABLET | Freq: Every day | ORAL | 0 refills | Status: DC

## 2017-07-27 NOTE — Telephone Encounter (Signed)
Copied from [place] #[id number]. Topic: Quick Communication - See Telephone Encounter >> Jul 27, 2017 10:14 AM Arletha Grippe wrote: CRM for notification. See Telephone encounter for:  Pt called for refill on sertraline (ZOLOFT) 50 MG tablet  [facility] day [facility] [address]  cb for pt is [phone]

## 2017-07-27 NOTE — Telephone Encounter (Signed)
Pt notified refill was ordered and make pick up when needed.

## 2017-08-11 ENCOUNTER — Telehealth: Payer: Self-pay | Admitting: *Deleted

## 2017-08-11 NOTE — Telephone Encounter (Signed)
Called and patient regarding her handicap application. Advised the patient that we can't sign the application since she is no longer under Dr. Fermin Schwab for post op care. Patient asked to have the form mailed back to her. Form placed in an a envelope and mailed to the paitent.

## 2017-08-22 ENCOUNTER — Telehealth: Payer: Self-pay | Admitting: Internal Medicine

## 2017-08-22 NOTE — Telephone Encounter (Signed)
Copied from [place] [id number]. Topic: Quick Communication - See Telephone Encounter >> Aug 22, 2017 11:10 AM Bea Graff, NT wrote: CRM for notification. See Telephone encounter for: Patient calling and states Webb Silversmith prescribed her sertraline 50mg  to help her get off of hydroxyzine 10mg . She would like to know if she plans to keep her on this medication or to prescribe a new one for her nerves because this bottle states no refills and she needs something for her nerves. She uses [facility] on [address]. She is weaned off the hydroxyzine.   08/22/17.

## 2017-08-22 NOTE — Telephone Encounter (Signed)
Yes we should continue Zoloft. Okay to send in refills if needed

## 2017-08-22 NOTE — Telephone Encounter (Signed)
Left detailed msg on VM per HIPAA  

## 2017-08-23 MED ORDER — SERTRALINE HCL 50 MG PO TABS: 50.0000 mg | ORAL_TABLET | Freq: Every day | ORAL | 5 refills | Status: DC

## 2017-08-23 NOTE — Addendum Note (Signed)
Addended by: Lurlean Nanny on: 08/23/2017 09:06 AM   Modules accepted: Orders

## 2017-08-23 NOTE — Telephone Encounter (Signed)
Pt is aware and Rx sent through e-scribe

## 2017-09-06 ENCOUNTER — Telehealth: Payer: Self-pay | Admitting: Internal Medicine

## 2017-09-06 NOTE — Telephone Encounter (Signed)
Pt last seen 06/02/17 for insomnia. Does pt need to schedule appt.?

## 2017-09-06 NOTE — Telephone Encounter (Signed)
Pt request for medication
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
## 2017-09-06 NOTE — Telephone Encounter (Signed)
She would need to be seen for additional medication. She could take 1.5 or 2 tablets of the Zoloft daily until her anxiety improves.

## 2017-09-07 ENCOUNTER — Telehealth: Payer: Self-pay
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
## 2017-09-07 NOTE — Telephone Encounter (Signed)
Pt is aware to try 1.5 tabs for a couple days and if no improvement increase to 2 tabs, if still no improvement, schedule an OV... Pt expressed understanding

## 2017-09-09 ENCOUNTER — Telehealth: Payer: Self-pay

## 2017-09-09 ENCOUNTER — Telehealth: Payer: Self-pay | Admitting: *Deleted

## 2017-09-09 NOTE — Telephone Encounter (Signed)
Copied from [place] [id number]. Topic: Inquiry >> Sep 09, 2017 11:52 AM Arletha Grippe wrote: CRM for notification. See Telephone encounter for:   09/09/17. Pt would like call from melanie - she got call from [facility] about a diagnosis for arthritis and says that she was not seen for arthritis.  Please call pt at  [phone] or cell number ([phone] [phone]

## 2017-09-09 NOTE — Telephone Encounter (Signed)
Called and left a detailed message with the dates/times of her appts. PET scan scheduled for January 28th at 2pm, and the follow up appt is on February 8th at

## 2017-09-09 NOTE — Telephone Encounter (Signed)
Left message on voicemail.

## 2017-09-19 ENCOUNTER — Other Ambulatory Visit: Payer: Self-pay | Admitting: Gynecologic Oncology

## 2017-09-19 DIAGNOSIS — C569 Malignant neoplasm of unspecified ovary: Secondary | ICD-10-CM

## 2017-09-19 NOTE — Progress Notes (Signed)
See RN note.  Patient called with weight loss of 10 lbs, abdominal pain, distension.  Evaluate for metastatic/recurrent disease.  Hx of recurrent ovarian cancer.

## 2017-09-20 ENCOUNTER — Other Ambulatory Visit (HOSPITAL_BASED_OUTPATIENT_CLINIC_OR_DEPARTMENT_OTHER): Payer: Medicare Other

## 2017-09-20 DIAGNOSIS — N3000 Acute cystitis without hematuria: Secondary | ICD-10-CM | POA: Diagnosis not present

## 2017-09-20 DIAGNOSIS — C569 Malignant neoplasm of unspecified ovary: Secondary | ICD-10-CM | POA: Diagnosis not present

## 2017-09-20 DIAGNOSIS — R351 Nocturia: Secondary | ICD-10-CM | POA: Diagnosis not present

## 2017-09-20 LAB — BASIC METABOLIC PANEL
Anion Gap: 9 mEq/L (ref 3–11)
BUN: 14.2 mg/dL (ref 7.0–26.0)
CHLORIDE: 105 meq/L (ref 98–109)
CO2: 28 meq/L (ref 22–29)
Calcium: 9.8 mg/dL (ref 8.4–10.4)
Creatinine: 0.9 mg/dL (ref 0.6–1.1)
Glucose: 91 mg/dl (ref 70–140)
POTASSIUM: 4.7 meq/L (ref 3.5–5.1)
SODIUM: 142 meq/L (ref 136–145)

## 2017-09-21 ENCOUNTER — Other Ambulatory Visit: Payer: Self-pay

## 2017-09-21 MED ORDER — SERTRALINE HCL 100 MG PO TABS: 100.0000 mg | ORAL_TABLET | Freq: Every day | ORAL | 0 refills | Status: DC

## 2017-09-21 NOTE — Telephone Encounter (Signed)
Pt has been taking 1.5 tabs since 09/07/17... Pt reports minimal notice of improvement.Marland KitchenMarland KitchenMarland KitchenPt was advised to increase to 2 tabs after a few days with no improvement but she did not.... I have sent in Rx for 100mg  tabs #30 and told her to try though next week and let us know as she only has enough for 2 days... Please advise if anything further needed

## 2017-09-21 NOTE — Telephone Encounter (Signed)
Pt is calling to talk to [facility] about the medicine she was told to increase.

## 2017-09-22 NOTE — Telephone Encounter (Signed)
Nothing further needed. She should let me know if no improvement in her anxiety after a few weeks of increasing to 100 mg

## 2017-09-23 ENCOUNTER — Encounter (HOSPITAL_COMMUNITY): Payer: Self-pay

## 2017-09-23 ENCOUNTER — Ambulatory Visit (HOSPITAL_COMMUNITY)
Admission: RE | Admit: 2017-09-23 | Discharge: 2017-09-23 | Disposition: A | Discharge status: Home / Self Care | Payer: Medicare Other | Source: Ambulatory Visit | Attending: Gynecologic Oncology | Admitting: Gynecologic Oncology

## 2017-09-23 DIAGNOSIS — R634 Abnormal weight loss: Secondary | ICD-10-CM | POA: Insufficient documentation

## 2017-09-23 DIAGNOSIS — R140 Abdominal distension (gaseous): Secondary | ICD-10-CM | POA: Insufficient documentation

## 2017-09-23 DIAGNOSIS — C569 Malignant neoplasm of unspecified ovary: Secondary | ICD-10-CM | POA: Diagnosis not present

## 2017-09-23 DIAGNOSIS — R109 Unspecified abdominal pain: Secondary | ICD-10-CM | POA: Diagnosis not present

## 2017-09-23 MED ORDER — IOPAMIDOL (ISOVUE-300) INJECTION 61%
INTRAVENOUS | Status: AC
  Administered 2017-09-23: 100 mL via INTRAVENOUS
  Filled 2017-09-23: qty 100

## 2017-09-23 MED ORDER — IOPAMIDOL (ISOVUE-300) INJECTION 61%
100.0000 mL | Freq: Once | INTRAVENOUS | Status: AC | PRN
  Administered 2017-09-23: 100 mL via INTRAVENOUS

## 2017-09-28 ENCOUNTER — Telehealth: Payer: Self-pay | Admitting: Gynecologic Oncology

## 2017-09-28 NOTE — Telephone Encounter (Signed)
Patient informed of CT scan results.  All questions answered.  Advised to call for any needs or concerns.

## 2017-10-18 ENCOUNTER — Other Ambulatory Visit: Payer: Self-pay | Admitting: Internal Medicine

## 2017-10-26 ENCOUNTER — Telehealth: Payer: Self-pay | Admitting: *Deleted

## 2017-10-26 NOTE — Telephone Encounter (Signed)
Return the patient's call and advised her that Dr. Fermin Schwab ordered the PET scan instead of labs.

## 2017-10-27 ENCOUNTER — Ambulatory Visit: Payer: Self-pay | Admitting: Radiation Oncology

## 2017-10-31 ENCOUNTER — Ambulatory Visit: Payer: Self-pay | Admitting: Radiation Oncology

## 2017-10-31 ENCOUNTER — Ambulatory Visit (HOSPITAL_COMMUNITY)
Admission: RE | Admit: 2017-10-31 | Discharge: 2017-10-31 | Disposition: A | Discharge status: Home / Self Care | Payer: Medicare Other | Source: Ambulatory Visit | Attending: Gynecologic Oncology | Admitting: Gynecologic Oncology

## 2017-10-31 DIAGNOSIS — C569 Malignant neoplasm of unspecified ovary: Secondary | ICD-10-CM | POA: Diagnosis not present

## 2017-10-31 LAB — GLUCOSE, CAPILLARY: Glucose-Capillary: 85 mg/dL (ref 65–99)

## 2017-10-31 MED ORDER — FLUDEOXYGLUCOSE F - 18 (FDG) INJECTION
7.1000 | Freq: Once | INTRAVENOUS | Status: AC | PRN
  Administered 2017-10-31: 7.1 via INTRAVENOUS

## 2017-11-01 ENCOUNTER — Telehealth: Payer: Self-pay

## 2017-11-01 NOTE — Telephone Encounter (Signed)
Told Ms Delker that the PET Scan showed no evidence of cancer per Melissa Cross,NP. Keep appointment with Dr. Fermin Schwab as scheduled 11-11-17. Pt verbalized understanding.

## 2017-11-11 ENCOUNTER — Encounter: Payer: Self-pay | Admitting: Gynecology

## 2017-11-11 ENCOUNTER — Inpatient Hospital Stay: Payer: Medicare Other | Attending: Gynecology | Admitting: Gynecology

## 2017-11-11 VITALS — BP 108/69 | HR 71 | Temp 97.7°F | Resp 18 | Ht 64.0 in | Wt 124.6 lb

## 2017-11-11 DIAGNOSIS — Z90722 Acquired absence of ovaries, bilateral: Secondary | ICD-10-CM | POA: Diagnosis not present

## 2017-11-11 DIAGNOSIS — R350 Frequency of micturition: Secondary | ICD-10-CM | POA: Diagnosis not present

## 2017-11-11 DIAGNOSIS — Z90710 Acquired absence of both cervix and uterus: Secondary | ICD-10-CM | POA: Diagnosis not present

## 2017-11-11 DIAGNOSIS — C569 Malignant neoplasm of unspecified ovary: Secondary | ICD-10-CM | POA: Diagnosis not present

## 2017-11-11 DIAGNOSIS — R1031 Right lower quadrant pain: Secondary | ICD-10-CM | POA: Diagnosis not present

## 2017-11-11 DIAGNOSIS — Z923 Personal history of irradiation: Secondary | ICD-10-CM | POA: Insufficient documentation

## 2017-11-11 DIAGNOSIS — Z9221 Personal history of antineoplastic chemotherapy: Secondary | ICD-10-CM | POA: Diagnosis not present

## 2017-11-11 NOTE — Patient Instructions (Addendum)
Follow up with Dr. Fermin Schwab in 3 months as scheduled.

## 2017-11-11 NOTE — Progress Notes (Signed)
Consult Note: Gyn-Onc   [name] [name] 67 y.o. female  Chief Complaint  Patient presents with  . Malignant neoplasm of ovary, unspecified laterality ([place])    Assessment: stage IIIc ovarian cancer (optimally debulked). Status post 6 cycles of carboplatin and Taxol given in a dose dense regimencompleted May 2014.  Pelvic lymph node recurrence in 2018 treated with radiation therapy completed 12/01/2016.Follow-up PET scan on 03/25/2017 showed complete response.  Plan  patient returns seem me in 3 months.Given that her tumor markers have not been elevated when she had recurrent disease I would recommend we repeat a PET scan in 6 months.  Interval history: .   Patient returns today as previously scheduled. Since her last visit she's done very well. On direct questioning she does have slight amount of right lower quadrant pain which is relieved when she has a bowel movement. Otherwise her appetite is good she has no GI GU or other pelvic symptoms. She's not having any sequelae from her recent radiation therapy. Overall her functional status is good.  She had a follow-up PET scan on October 31, 2017 showing no evidence of disease.  [id number] is a 67 year old gravida 2 para 2 who went through menopause at the age about 67 years of age. She never took any hormone replacement therapy. She states that for the past 6 months she's experienced some pelvic soreness in pressure. She has noted some increasing urinary frequency. With these symptoms she had a PET scan on July 12, 2012. It revealed significant soft tissue thickening seen throughout the greater omentum suspicious for intraperitoneal carcinoma. There is minimal ascites in the pelvis. it revealed the areas of omental and peritoneal nodularity were hypermetabolic. There is a dominant pelvic omental mass measuring 10.4 x 3.3 cm. There is a small omental implants in the left side of the abdomen measuring 1.1 cm. There were multiple pelvic foci That  wereperitoneal based And hypermetabolic. These included the cul-de-sac. She has small volume of ascites. She underwent a CT-guided biopsy on July 24, 2012. It revealed metastatic carcinoma. By immunohistochemistry, malignant cells were positive for WT 1, cytokeratin 7, estrogen receptor. There were negative for TTF-1, progesterone receptor, cytokeratin 20, CEA, CD X2, and S100. Overall, the histologic features were highly suggestive of a primary gynecologic origin. Her CA 125 on July 13, 2012 was elevated at 60.  She underwent exploratory laparotomy on 08/22/2012 findings of stage IIIC ovarian cancer. Patient was optimally debulked. Debulking, however, required a rectosigmoid resection as well as total omentectomy total nominal hysterectomy bilateral salpingo-oophorectomy. She was optimally debulked with only 5 mm nodules on the right diaphragm as residual disease.  SURGICAL FINDINGS: At exploratory laparotomy the infracolic omentum was nearly replaced with tumor. This measured approximately 12 x 16 cm. There were 5 mm nodules on the right diaphragm. The small bowel serosa and mesentery were normal. In the pelvis it appeared that the left ovary was a primary source. It was cystic and densely adherent to the sigmoid mesentery. The right ovary was approximately 4 cm in diameter and had multiple excrescences on it. There were extensive tumor implants on the bladder flap and involving the sigmoid mesentery and serosa, and posterior cul-de-sac. At the conclusion of the surgical procedure the only residual disease were 5 mm nodules on the right diaphragm. Chemotherapy was initiated in December 2013 using the dose dense regimen of carboplatin and Taxol   She received 6 cycles of carboplatin and Taxol the last administered on 02/06/2013. At that time her CA  125 is 5.6 units per mL.  Patient was found to have recurrent disease in January 2018. It was confined to the right external iliac lymph node chain and  subsequently treated with whole pelvis radiation therapy and a boost to the right pelvic sidewall.  A follow-up PET scan on October 31, 2017 was negative for any evidence of metastatic disease.  Review of Systems:10 point review of systems is negative as noted above.   Vitals: Blood pressure 108/69, pulse 71, temperature 97.7 F (36.5 C), temperature source Oral, resp. rate 18, height 5\' 4"  (1.626 m), weight 124 lb 9.6 oz (56.5 kg), SpO2 98 %.  Physical Exam: General : The patient is a healthy woman in no acute distress.  HEENT: normocephalic, extraoccular movements normal; neck is supple without thyromegally  Lynphnodes: Supraclavicular and inguinal nodes not enlarged   Abdomen: Soft, non-tender, no ascites, no organomegally, no masses, no hernias  Pelvic:  EGBUS: Normal female  Vagina: Normal, no lesions no evidence of bleeding. Urethra and Bladder: Normal, non-tender  Cervix: Surgically absent  Uterus: Surgically absent   bi-manual examination: No masses or nodularity..  Rectal: normal sphincter tone, no masses, no blood  Lower extremities: No edema or varicosities. Normal range of motion      Allergies  Allergen Reactions  . Atorvastatin Other (See Comments)    Leg pain  . Macrodantin Other (See Comments)    Unknown Reaction  . Decongest-Aid [Pseudoephedrine] Palpitations    Past Medical History:  Diagnosis Date  . Anxiety and depression    . DJD (degenerative joint disease)   . Elevated cholesterol   . Endometrial polyp   . History of radiation therapy 10/25/16-12/01/16   right pelvis/60.2 Gy in 28 fractions  . IRRITABLE BOWEL SYNDROME, HX OF 05/15/2008  . MVP (mitral valve prolapse)    occ palpitations   . Ovarian ca ([place]) 07-2012      . Rheumatoid arthritis(714.0) ~ 2010   dr Ouida Sills  . Shingles     Past Surgical History:  Procedure Laterality Date  . ABDOMINAL HYSTERECTOMY  08/22/2012   Procedure: HYSTERECTOMY ABDOMINAL;  Surgeon: Alvino Chapel, MD;  [facility];  Service: Gynecology;  Laterality: N/A;  . BACK SURGERY  2008   Dr Trenton Gammon  . COLONOSCOPY  04-20-2004  . COLONOSCOPY W/ BIOPSIES  07/2014  . COLOSTOMY REVISION  08/22/2012   Procedure: COLON RESECTION SIGMOID;  Surgeon: Alvino Chapel, MD;  Location: [facility];  Service: Gynecology;;  Rectal Sigmoid resection and low rectal anastomosis  . CYST ON NECK  2011  . DEBULKING  08/22/2012   Procedure: DEBULKING;  Surgeon: Alvino Chapel, MD;  Location[facility];  Service: Gynecology;  Laterality: N/A;  Radical tumor debulking, Bilateral Ureterolysis  . DILATION AND CURETTAGE OF UTERUS  2004   WITH HYSTEROSCOPY  . HAND SURGERY  1996  . HYSTEROSCOPY  2004   D&C  . NECK SURGERY  2009   SPURS  . OMENTECTOMY  08/22/2012   Procedure: OMENTECTOMY;  Surgeon: Alvino Chapel, MD;  [facility];  Service: Gynecology;  Laterality: N/A;  . SALPINGOOPHORECTOMY  08/22/2012   Procedure: SALPINGO OOPHORECTOMY;  Surgeon: Alvino Chapel, MD;  Location: [facility];  Service: Gynecology;  Laterality: Bilateral;  . TUBAL LIGATION  1980    Current Outpatient Medications  Medication Sig Dispense Refill  . docusate sodium (COLACE) 100 MG capsule Take 100 mg by mouth daily as needed.     . estazolam (PROSOM) 2 MG  tablet Take 1-2 mg by mouth at bedtime.     . fish oil-omega-3 fatty acids 1000 MG capsule Take 1 g by mouth daily.      . Multiple Vitamin (MULTIVITAMIN WITH MINERALS) TABS Take 1 tablet by mouth daily.    . polyethylene glycol (MIRALAX / GLYCOLAX) packet Take 17 g by mouth daily.    . pravastatin (PRAVACHOL) 40 MG tablet Take 1 tablet (40 mg total) by mouth daily. 90 tablet 3  . sertraline (ZOLOFT) 100 MG tablet TAKE 1 TABLET BY MOUTH ONCE DAILY 30 tablet 0   No current facility-administered medications for this visit.     Social History   Socioeconomic History  . Marital status: Married    Spouse name: Not on file  . Number of  children: 2  . Years of education: Not on file  . Highest education level: Not on file  Social Needs  . Financial resource strain: Not on file  . Food insecurity - worry: Not on file  . Food insecurity - inability: Not on file  . Transportation needs - medical: Not on file  . Transportation needs - non-medical: Not on file  Occupational History  . Occupation: disability d/t RA  Tobacco Use  . Smoking status: Never Smoker  . Smokeless tobacco: Never Used  Substance and Sexual Activity  . Alcohol use: No  . Drug use: No  . Sexual activity: No    Birth control/protection: Post-menopausal, Surgical  Other Topics Concern  . Not on file  Social History Narrative   [name] [name] is her daughter    Household-- pr and husband    Family History  Problem Relation Age of Onset  . Heart disease Mother   . Bladder Cancer Father 64  . Lymphoma Paternal Aunt   . Osteoporosis Sister   . Colon cancer Neg Hx   . Rectal cancer Neg Hx   . Stomach cancer Neg Hx   . Diabetes Neg Hx   . Stroke Neg Hx       [name] Sleigh, MD 11/11/2017, 10:02 AM

## 2017-11-17 ENCOUNTER — Telehealth: Payer: Self-pay | Admitting: Internal Medicine

## 2017-11-17 MED ORDER — SERTRALINE HCL 100 MG PO TABS: 100.0000 mg | ORAL_TABLET | Freq: Every day | ORAL | 0 refills | Status: DC

## 2017-11-17 NOTE — Telephone Encounter (Signed)
Copied from [place] #[id number]. Topic: Quick Communication - See Telephone Encounter >> Nov 17, 2017  1:14 PM Corie Chiquito, [place] wrote: CRM for notification. Patient calling to let Ms.Baity know that the Sertraline 100mg  helped her. She would like to have a refill on the medication Sertraline 100mg  as well and would like the medication to be sent to the [facility] [address] [place] [phone]. I did advise patient to contact the pharmacy when she is in need of her next refill 11/17/17.

## 2017-11-17 NOTE — Telephone Encounter (Signed)
Rx sent through e-scribe  

## 2017-11-17 NOTE — Telephone Encounter (Signed)
Sertraline refill Last OV: 06/01/17 Last Refill:10/18/17 Pharmacy: [address] [place], [place]

## 2017-12-14 ENCOUNTER — Ambulatory Visit: Payer: Medicare Other | Admitting: Internal Medicine

## 2017-12-14 ENCOUNTER — Encounter: Payer: Self-pay | Admitting: Internal Medicine

## 2017-12-14 VITALS — HR 82 | Temp 98.5°F | Wt 125.0 lb

## 2017-12-14 DIAGNOSIS — H6123 Impacted cerumen, bilateral: Secondary | ICD-10-CM

## 2017-12-14 NOTE — Progress Notes (Signed)
Subjective:    Patient ID: [name] [name], female    DOB: 07/10/1951, 67 y.o.   MRN: [id number]  HPI  Pt presents to the clinic today with c/o bilateral ear fullness. She reports some associated sore and achy. She does have some decreased hearing. She denies runny nose, nasal congestion, sore throat or cough. She has not tried anything OTC for her symptoms.  Review of Systems      Past Medical History:  Diagnosis Date  . Anxiety and depression    . DJD (degenerative joint disease)   . Elevated cholesterol   . Endometrial polyp   . History of radiation therapy 10/25/16-12/01/16   right pelvis/60.2 Gy in 28 fractions  . IRRITABLE BOWEL SYNDROME, HX OF 05/15/2008  . MVP (mitral valve prolapse)    occ palpitations   . Ovarian ca ([place]) 07-2012      . Rheumatoid arthritis(714.0) ~ 2010   dr Ouida Sills  . Shingles     Current Outpatient Medications  Medication Sig Dispense Refill  . docusate sodium (COLACE) 100 MG capsule Take 100 mg by mouth daily as needed.     . estazolam (PROSOM) 2 MG tablet Take 1-2 mg by mouth at bedtime.     . fish oil-omega-3 fatty acids 1000 MG capsule Take 1 g by mouth daily.      . Multiple Vitamin (MULTIVITAMIN WITH MINERALS) TABS Take 1 tablet by mouth daily.    . polyethylene glycol (MIRALAX / GLYCOLAX) packet Take 17 g by mouth daily.    . pravastatin (PRAVACHOL) 40 MG tablet Take 1 tablet (40 mg total) by mouth daily. 90 tablet 3  . sertraline (ZOLOFT) 100 MG tablet Take 1 tablet (100 mg total) by mouth daily. 90 tablet 0   No current facility-administered medications for this visit.     Allergies  Allergen Reactions  . Atorvastatin Other (See Comments)    Leg pain  . Macrodantin Other (See Comments)    Unknown Reaction  . Decongest-Aid [Pseudoephedrine] Palpitations    Family History  Problem Relation Age of Onset  . Heart disease Mother   . Bladder Cancer Father 56  . Lymphoma Paternal Aunt   . Osteoporosis Sister   . Colon cancer  Neg Hx   . Rectal cancer Neg Hx   . Stomach cancer Neg Hx   . Diabetes Neg Hx   . Stroke Neg Hx     Social History   Socioeconomic History  . Marital status: Married    Spouse name: Not on file  . Number of children: 2  . Years of education: Not on file  . Highest education level: Not on file  Social Needs  . Financial resource strain: Not on file  . Food insecurity - worry: Not on file  . Food insecurity - inability: Not on file  . Transportation needs - medical: Not on file  . Transportation needs - non-medical: Not on file  Occupational History  . Occupation: disability d/t RA  Tobacco Use  . Smoking status: Never Smoker  . Smokeless tobacco: Never Used  Substance and Sexual Activity  . Alcohol use: No  . Drug use: No  . Sexual activity: No    Birth control/protection: Post-menopausal, Surgical  Other Topics Concern  . Not on file  Social History Narrative   [name] is her daughter    Household-- pr and husband     Constitutional: Denies fever, malaise, fatigue, headache or abrupt weight changes.  HEENT:  Pt reports ear fullness. Denies eye pain, eye redness, ear pain, ringing in the ears, wax buildup, runny nose, nasal congestion, bloody nose, or sore throat.   No other specific complaints in a complete review of systems (except as listed in HPI above).  Objective:   Physical Exam    Pulse 82   Temp 98.5 F (36.9 C) (Oral)   Wt 125 lb (56.7 kg)   SpO2 98%   BMI 21.46 kg/m  Wt Readings from Last 3 Encounters:  12/14/17 125 lb (56.7 kg)  11/11/17 124 lb 9.6 oz (56.5 kg)  07/11/17 134 lb 1.6 oz (60.8 kg)    General: Appears her stated age, well developed, well nourished in NAD. HEENT:Ears: Bilateral cerumen impaction.  BMET    Component Value Date/Time   NA 142 09/20/2017 0940   K 4.7 09/20/2017 0940   CL 103 04/21/2017 1149   CL 104 01/02/2013 0821   CO2 28 09/20/2017 0940   GLUCOSE 91 09/20/2017 0940   GLUCOSE 86 01/02/2013 0821     BUN 14.2 09/20/2017 0940   CREATININE 0.9 09/20/2017 0940   CALCIUM 9.8 09/20/2017 0940   GFRNONAA >90 08/23/2012 0340   GFRAA >90 08/23/2012 0340    Lipid Panel     Component Value Date/Time   CHOL 226 (H) 04/21/2017 1149   TRIG 145.0 04/21/2017 1149   HDL 57.20 04/21/2017 1149   CHOLHDL 4 04/21/2017 1149   VLDL 29.0 04/21/2017 1149   LDLCALC 139 (H) 04/21/2017 1149    CBC    Component Value Date/Time   WBC 4.6 04/21/2017 1149   RBC 4.67 04/21/2017 1149   HGB 15.4 (H) 04/21/2017 1149   HGB 12.8 03/26/2013 0842   HCT 46.9 (H) 04/21/2017 1149   HCT 37.4 03/26/2013 0842   PLT 246.0 04/21/2017 1149   PLT 194 03/26/2013 0842   MCV 100.4 (H) 04/21/2017 1149   MCV 111.1 (H) 03/26/2013 0842   MCH 32.7 09/07/2016 0716   MCHC 32.9 04/21/2017 1149   RDW 13.2 04/21/2017 1149   RDW 14.5 03/26/2013 0842   LYMPHSABS 1.5 10/18/2014 1024   LYMPHSABS 1.0 03/26/2013 0842   MONOABS 0.6 10/18/2014 1024   MONOABS 0.5 03/26/2013 0842   EOSABS 0.1 10/18/2014 1024   EOSABS 0.1 03/26/2013 0842   BASOSABS 0.0 10/18/2014 1024   BASOSABS 0.0 03/26/2013 0842    Hgb A1C No results found for: HGBA1C        Assessment & Plan:   Bilateral Cerumen Impaction:  Manual lavage by CMA Advised her to try Debrox 2 x week to prevent wax buildup  Return precautions discussed Webb Silversmith, NP

## 2017-12-14 NOTE — Patient Instructions (Signed)
Earwax Buildup, Adult The ears produce a substance called earwax that helps keep bacteria out of the ear and protects the skin in the ear canal. Occasionally, earwax can build up in the ear and cause discomfort or hearing loss. What increases the risk? This condition is more likely to develop in people who:  Are female.  Are elderly.  Naturally produce more earwax.  Clean their ears often with cotton swabs.  Use earplugs often.  Use in-ear headphones often.  Wear hearing aids.  Have narrow ear canals.  Have earwax that is overly thick or sticky.  Have eczema.  Are dehydrated.  Have excess hair in the ear canal.  What are the signs or symptoms? Symptoms of this condition include:  Reduced or muffled hearing.  A feeling of fullness in the ear or feeling that the ear is plugged.  Fluid coming from the ear.  Ear pain.  Ear itch.  Ringing in the ear.  Coughing.  An obvious piece of earwax that can be seen inside the ear canal.  How is this diagnosed? This condition may be diagnosed based on:  Your symptoms.  Your medical history.  An ear exam. During the exam, your health care provider will look into your ear with an instrument called an otoscope.  You may have tests, including a hearing test. How is this treated? This condition may be treated by:  Using ear drops to soften the earwax.  Having the earwax removed by a health care provider. The health care provider may: ? Flush the ear with water. ? Use an instrument that has a loop on the end (curette). ? Use a suction device.  Surgery to remove the wax buildup. This may be done in severe cases.  Follow these instructions at home:  Take over-the-counter and prescription medicines only as told by your health care provider.  Do not put any objects, including cotton swabs, into your ear. You can clean the opening of your ear canal with a washcloth or facial tissue.  Follow instructions from your health  care provider about cleaning your ears. Do not over-clean your ears.  Drink enough fluid to keep your urine clear or pale yellow. This will help to thin the earwax.  Keep all follow-up visits as told by your health care provider. If earwax builds up in your ears often or if you use hearing aids, consider seeing your health care provider for routine, preventive ear cleanings. Ask your health care provider how often you should schedule your cleanings.  If you have hearing aids, clean them according to instructions from the manufacturer and your health care provider. Contact a health care provider if:  You have ear pain.  You develop a fever.  You have blood, pus, or other fluid coming from your ear.  You have hearing loss.  You have ringing in your ears that does not go away.  Your symptoms do not improve with treatment.  You feel like the room is spinning (vertigo). Summary  Earwax can build up in the ear and cause discomfort or hearing loss.  The most common symptoms of this condition include reduced or muffled hearing and a feeling of fullness in the ear or feeling that the ear is plugged.  This condition may be diagnosed based on your symptoms, your medical history, and an ear exam.  This condition may be treated by using ear drops to soften the earwax or by having the earwax removed by a health care provider.  Do   not put any objects, including cotton swabs, into your ear. You can clean the opening of your ear canal with a washcloth or facial tissue. This information is not intended to replace advice given to you by your health care provider. Make sure you discuss any questions you have with your health care provider. Document Released: 10/28/2004 Document Revised: 12/01/2016 Document Reviewed: 12/01/2016 Elsevier Interactive Patient Education  2018 Elsevier Inc.  

## 2017-12-15 ENCOUNTER — Other Ambulatory Visit: Payer: Self-pay | Admitting: Internal Medicine

## 2017-12-15 DIAGNOSIS — Z1231 Encounter for screening mammogram for malignant neoplasm of breast: Secondary | ICD-10-CM

## 2017-12-26 ENCOUNTER — Telehealth: Payer: Self-pay | Admitting: Internal Medicine

## 2017-12-26 NOTE — Telephone Encounter (Signed)
Copied from [place]. Topic: Quick Communication - See Telephone Encounter >> Dec 26, 2017 12:42 PM Cleaster Corin, NT wrote: CRM for notification. See Telephone encounter for: 12/26/17.  Pt. Calling to let [place] know that med. sertraline (ZOLOFT) 100 MG tablet [[id number]] increased and it made the pt. to sleepy. So she is going to continue to take just 1 (100mg  tablet)

## 2017-12-27 ENCOUNTER — Telehealth: Payer: Self-pay | Admitting: Internal Medicine

## 2017-12-27 NOTE — Telephone Encounter (Signed)
I do not see where Avie Echevaria NP has prescribed.Please advise.

## 2017-12-27 NOTE — Telephone Encounter (Signed)
Copied from [place] [id number]. Topic: Quick Communication - Rx Refill/Question >> Dec 27, 2017  3:41 PM Neva Seat wrote: Estazolam 2 mg -    Needing refills   [facility] ([place] [place] - [address] [place]) [place] [address] Phone: [phone] Fax: [phone] Not a 24 hour pharmacy; exact hours not known.

## 2017-12-27 NOTE — Telephone Encounter (Signed)
Is this an FYI or does she need a refill?

## 2017-12-27 NOTE — Telephone Encounter (Signed)
Routed to the office in another encounter.

## 2017-12-27 NOTE — Telephone Encounter (Signed)
Copied from [place] [id number]. Topic: Quick Communication - See Telephone Encounter >> Dec 27, 2017  3:39 PM Neva Seat wrote: Estazolam 2 mg -  pt tried the 3 mg but it was making her sleepy during the day. Pt wants to stay at the 2 mg.

## 2017-12-29 ENCOUNTER — Other Ambulatory Visit: Payer: Self-pay | Admitting: Internal Medicine

## 2017-12-29 NOTE — Telephone Encounter (Signed)
Copied from [place] [id number]. Topic: Quick Communication - Rx Refill/Question >> Dec 29, 2017  1:00 PM Cleaster Corin, [place] wrote: Medication: estazolam (PROSOM) 2 MG tablet [[id number]]  sertraline (ZOLOFT) 100 MG tablet [[id number]]  Has the patient contacted their pharmacy? yes (Agent: If no, request that the patient contact the pharmacy for the refill.) Preferred Pharmacy (with phone number or street name): [facility] ([address] [place] - [address] [place] ([place]) [place] [address] Phone: [phone] Fax: [phone]   Agent: Please be advised that RX refills may take up to 3 business days. We ask that you follow-up with your pharmacy.

## 2017-12-29 NOTE — Telephone Encounter (Signed)
I do not see where this has been filled by you.... Please advise

## 2017-12-29 NOTE — Telephone Encounter (Signed)
Duplicate request, already forwarded to [facility]

## 2017-12-29 NOTE — Telephone Encounter (Signed)
Pt. Requesting Prosom refill. Provider's name is not on medication list.Please advise.

## 2017-12-30 NOTE — Telephone Encounter (Signed)
This is prescribed by psychiatry, not me

## 2018-01-04 NOTE — Telephone Encounter (Signed)
Pt called to ask about the Rx of estazolam (PROSOM) 2 MG tablet [[id number]]  Pt wants to know why it hasnt been filled, call pt to advise

## 2018-01-04 NOTE — Telephone Encounter (Signed)
Called and spoke with pt's husband. Pt's estazolam (prosom) was prescribed by Dr Artist Pais her psychiatrist

## 2018-01-05 ENCOUNTER — Other Ambulatory Visit: Payer: Self-pay

## 2018-01-05 ENCOUNTER — Ambulatory Visit: Payer: Medicare Other | Admitting: Internal Medicine

## 2018-01-05 ENCOUNTER — Ambulatory Visit
Admission: RE | Admit: 2018-01-05 | Discharge: 2018-01-05 | Disposition: A | Discharge status: Home / Self Care | Payer: Medicare Other | Source: Ambulatory Visit | Attending: Internal Medicine | Admitting: Internal Medicine

## 2018-01-05 DIAGNOSIS — Z1231 Encounter for screening mammogram for malignant neoplasm of breast: Secondary | ICD-10-CM | POA: Diagnosis not present

## 2018-01-05 MED ORDER — ESTAZOLAM 2 MG PO TABS: 1.0000 mg | ORAL_TABLET | Freq: Every day | ORAL | 0 refills | Status: DC

## 2018-01-27 ENCOUNTER — Telehealth: Payer: Self-pay | Admitting: *Deleted

## 2018-01-27 NOTE — Telephone Encounter (Signed)
Returned the patient's call and moved her lab appt from May 10th to May 9th.

## 2018-01-27 NOTE — Telephone Encounter (Signed)
Patient called back and moved her appt from May 9th to May 6th

## 2018-02-06 ENCOUNTER — Inpatient Hospital Stay: Payer: Medicare Other | Attending: Gynecology

## 2018-02-06 DIAGNOSIS — Z9221 Personal history of antineoplastic chemotherapy: Secondary | ICD-10-CM | POA: Insufficient documentation

## 2018-02-06 DIAGNOSIS — Z90722 Acquired absence of ovaries, bilateral: Secondary | ICD-10-CM | POA: Diagnosis not present

## 2018-02-06 DIAGNOSIS — Z923 Personal history of irradiation: Secondary | ICD-10-CM | POA: Diagnosis not present

## 2018-02-06 DIAGNOSIS — Z8543 Personal history of malignant neoplasm of ovary: Secondary | ICD-10-CM | POA: Insufficient documentation

## 2018-02-06 DIAGNOSIS — C569 Malignant neoplasm of unspecified ovary: Secondary | ICD-10-CM

## 2018-02-06 DIAGNOSIS — Z90710 Acquired absence of both cervix and uterus: Secondary | ICD-10-CM | POA: Insufficient documentation

## 2018-02-07 ENCOUNTER — Telehealth: Payer: Self-pay

## 2018-02-07 LAB — CA 125: Cancer Antigen (CA) 125: 5.6 U/mL (ref 0.0–38.1)

## 2018-02-07 NOTE — Telephone Encounter (Signed)
Spoke with husband and told him that the CA-125 was WNL at [facility].  Keep appointment as scheduled on 02-14-18 with Dr. Fermin Schwab.

## 2018-02-09 ENCOUNTER — Other Ambulatory Visit: Payer: Medicare Other

## 2018-02-10 ENCOUNTER — Other Ambulatory Visit: Payer: Medicare Other

## 2018-02-14 ENCOUNTER — Encounter: Payer: Self-pay | Admitting: Gynecology

## 2018-02-14 ENCOUNTER — Inpatient Hospital Stay (HOSPITAL_BASED_OUTPATIENT_CLINIC_OR_DEPARTMENT_OTHER): Payer: Medicare Other | Admitting: Gynecology

## 2018-02-14 VITALS — BP 104/71 | HR 65 | Temp 97.6°F | Resp 18 | Ht 64.0 in | Wt 126.7 lb

## 2018-02-14 DIAGNOSIS — Z9221 Personal history of antineoplastic chemotherapy: Secondary | ICD-10-CM | POA: Diagnosis not present

## 2018-02-14 DIAGNOSIS — Z8543 Personal history of malignant neoplasm of ovary: Secondary | ICD-10-CM | POA: Diagnosis not present

## 2018-02-14 DIAGNOSIS — Z90710 Acquired absence of both cervix and uterus: Secondary | ICD-10-CM

## 2018-02-14 DIAGNOSIS — Z923 Personal history of irradiation: Secondary | ICD-10-CM

## 2018-02-14 DIAGNOSIS — Z90722 Acquired absence of ovaries, bilateral: Secondary | ICD-10-CM | POA: Diagnosis not present

## 2018-02-14 DIAGNOSIS — C569 Malignant neoplasm of unspecified ovary: Secondary | ICD-10-CM

## 2018-02-14 NOTE — Progress Notes (Signed)
Consult Note: Gyn-Onc   ALTON TREMBLAY 67 y.o. female  No chief complaint on file.   Assessment: stage IIIc ovarian cancer (optimally debulked). Status post 6 cycles of carboplatin and Taxol given in a dose dense regimencompleted May 2014.  Pelvic lymph node recurrence in 2018 treated with radiation therapy completed 12/01/2016.  Clinically NED.  Plan  patient returns seem me in 3 months.Given that her tumor markers have not been elevated when she had recurrent disease I would recommend we repeat a PET just prior to her next visit.  Interval history: .   Patient returns today as previously scheduled. Since her last visit she's done very well.  In fact she says that over the last 2 months she is felt better than she is ever felt in her life.  She is been very active getting out of [place]  On direct questioning she does have slight amount of right lower quadrant pain which is relieved when she has a bowel movement. Otherwise her appetite is good she has no GI GU or other pelvic symptoms. She's not having any sequelae from her recent radiation therapy. Overall her functional status is excellent.  Her most recent Ca1 25 was 5.1 units/mL.  PYP:PJKDTOI is a 67 year old gravida 2 para 2 who went through menopause at the age about 67 years of age. She never took any hormone replacement therapy. She states that for the past 6 months she's experienced some pelvic soreness in pressure. She has noted some increasing urinary frequency. With these symptoms she had a PET scan on July 12, 2012. It revealed significant soft tissue thickening seen throughout the greater omentum suspicious for intraperitoneal carcinoma. There is minimal ascites in the pelvis. it revealed the areas of omental and peritoneal nodularity were hypermetabolic. There is a dominant pelvic omental mass measuring 10.4 x 3.3 cm. There is a small omental implants in the left side of the abdomen  measuring 1.1 cm. There were multiple pelvic foci That wereperitoneal based And hypermetabolic. These included the cul-de-sac. She has small volume of ascites. She underwent a CT-guided biopsy on July 24, 2012. It revealed metastatic carcinoma. By immunohistochemistry, malignant cells were positive for WT 1, cytokeratin 7, estrogen receptor. There were negative for TTF-1, progesterone receptor, cytokeratin 20, CEA, CD X2, and S100. Overall, the histologic features were highly suggestive of a primary gynecologic origin. Her CA 125 on July 13, 2012 was elevated at 88.  She underwent exploratory laparotomy on 08/22/2012 findings of stage IIIC ovarian cancer. Patient was optimally debulked. Debulking, however, required a rectosigmoid resection as well as total omentectomy total nominal hysterectomy bilateral salpingo-oophorectomy. She was optimally debulked with only 5 mm nodules on the right diaphragm as residual disease.  SURGICAL FINDINGS: At exploratory laparotomy the infracolic omentum was nearly replaced with tumor. This measured approximately 12 x 16 cm. There were 5 mm nodules on the right diaphragm. The small bowel serosa and mesentery were normal. In the pelvis it appeared that the left ovary was a primary source. It was cystic and densely adherent to the sigmoid mesentery. The right ovary was approximately 4 cm in diameter and had multiple excrescences on it. There were extensive tumor implants on the bladder flap and involving the sigmoid mesentery and serosa, and posterior cul-de-sac. At the conclusion of the surgical procedure the only residual disease were 5 mm nodules on the right diaphragm. Chemotherapy was initiated in December 2013 using the dose dense regimen of carboplatin  and Taxol   She received 6 cycles of carboplatin and Taxol the last administered on 02/06/2013. At that time her CA 125 is 5.6 units per mL.  Patient was found to have recurrent disease in January 2018. It was  confined to the right external iliac lymph node chain and subsequently treated with whole pelvis radiation therapy and a boost to the right pelvic sidewall.  A follow-up PET scan on October 31, 2017 was negative for any evidence of metastatic disease.  Review of Systems:10 point review of systems is negative as noted above.   Vitals: Blood pressure 104/71, pulse 65, temperature 97.6 F (36.4 C), temperature source Oral, resp. rate 18, height 5\' 4"  (1.626 m), weight 126 lb 11.2 oz (57.5 kg), SpO2 100 %.  Physical Exam: General : The patient is a healthy woman in no acute distress.  HEENT: normocephalic, extraoccular movements normal; neck is supple without thyromegally  Lynphnodes: Supraclavicular and inguinal nodes not enlarged   Abdomen: Soft, non-tender, no ascites, no organomegally, no masses, no hernias  Pelvic:  EGBUS: Normal female  Vagina: Normal, no lesions no evidence of bleeding. Urethra and Bladder: Normal, non-tender  Cervix: Surgically absent  Uterus: Surgically absent   bi-manual examination: No masses or nodularity..  Rectal: normal sphincter tone, no masses, no blood  Lower extremities: No edema or varicosities. Normal range of motion      Allergies  Allergen Reactions  . Atorvastatin Other (See Comments)    Leg pain  . Macrodantin Other (See Comments)    Unknown Reaction  . Decongest-Aid [Pseudoephedrine] Palpitations    Past Medical History:  Diagnosis Date  . Anxiety and depression    . DJD (degenerative joint disease)   . Elevated cholesterol   . Endometrial polyp   . History of radiation therapy 10/25/16-12/01/16   right pelvis/60.2 Gy in 28 fractions  . IRRITABLE BOWEL SYNDROME, HX OF 05/15/2008  . MVP (mitral valve prolapse)    occ palpitations   . Ovarian ca ([place]) 07-2012      . Rheumatoid arthritis(714.0) ~ 2010   dr Ouida Sills  . Shingles     Past Surgical History:  Procedure Laterality Date  . ABDOMINAL HYSTERECTOMY  08/22/2012    Procedure: HYSTERECTOMY ABDOMINAL;  Surgeon: Alvino Chapel, MD;  Location: [facility];  Service: Gynecology;  Laterality: N/A;  . BACK SURGERY  2008   Dr Trenton Gammon  . COLONOSCOPY  04-20-2004  . COLONOSCOPY W/ BIOPSIES  07/2014  . COLOSTOMY REVISION  08/22/2012   Procedure: COLON RESECTION SIGMOID;  Surgeon: Alvino Chapel, MD;  Location: [facility];  Service: Gynecology;;  Rectal Sigmoid resection and low rectal anastomosis  . CYST ON NECK  2011  . DEBULKING  08/22/2012   Procedure: DEBULKING;  Surgeon: Alvino Chapel, MD;  Location: [facility];  Service: Gynecology;  Laterality: N/A;  Radical tumor debulking, Bilateral Ureterolysis  . DILATION AND CURETTAGE OF UTERUS  2004   WITH HYSTEROSCOPY  . HAND SURGERY  1996  . HYSTEROSCOPY  2004   D&C  . NECK SURGERY  2009   SPURS  . OMENTECTOMY  08/22/2012   Procedure: OMENTECTOMY;  Surgeon: Alvino Chapel, MD;  Location: [facility];  Service: Gynecology;  Laterality: N/A;  . SALPINGOOPHORECTOMY  08/22/2012   Procedure: SALPINGO OOPHORECTOMY;  Surgeon: Alvino Chapel, MD;  Location: [facility];  Service: Gynecology;  Laterality: Bilateral;  . TUBAL LIGATION  1980    Current Outpatient Medications  Medication Sig Dispense Refill  . docusate  sodium (COLACE) 100 MG capsule Take 100 mg by mouth daily as needed.     . estazolam (PROSOM) 2 MG tablet Take 0.5-1 tablets (1-2 mg total) by mouth at bedtime. 30 tablet 0  . fish oil-omega-3 fatty acids 1000 MG capsule Take 1 g by mouth daily.      . Multiple Vitamin (MULTIVITAMIN WITH MINERALS) TABS Take 1 tablet by mouth daily.    . polyethylene glycol (MIRALAX / GLYCOLAX) packet Take 17 g by mouth daily.    . pravastatin (PRAVACHOL) 40 MG tablet Take 1 tablet (40 mg total) by mouth daily. 90 tablet 3  . sertraline (ZOLOFT) 100 MG tablet TAKE 1 TABLET BY MOUTH ONCE DAILY 90 tablet 0   No current facility-administered medications for this visit.     Social History    Socioeconomic History  . Marital status: Married    Spouse name: Not on file  . Number of children: 2  . Years of education: Not on file  . Highest education level: Not on file  Occupational History  . Occupation: disability d/t RA  Social Needs  . Financial resource strain: Not on file  . Food insecurity:    Worry: Not on file    Inability: Not on file  . Transportation needs:    Medical: Not on file    Non-medical: Not on file  Tobacco Use  . Smoking status: Never Smoker  . Smokeless tobacco: Never Used  Substance and Sexual Activity  . Alcohol use: No  . Drug use: No  . Sexual activity: Never    Birth control/protection: Post-menopausal, Surgical  Lifestyle  . Physical activity:    Days per week: Not on file    Minutes per session: Not on file  . Stress: Not on file  Relationships  . Social connections:    Talks on phone: Not on file    Gets together: Not on file    Attends religious service: Not on file    Active member of club or organization: Not on file    Attends meetings of clubs or organizations: Not on file    Relationship status: Not on file  . Intimate partner violence:    Fear of current or ex partner: Not on file    Emotionally abused: Not on file    Physically abused: Not on file    Forced sexual activity: Not on file  Other Topics Concern  . Not on file  Social History Narrative   [name] is her daughter    Household-- pr and husband    Family History  Problem Relation Age of Onset  . Heart disease Mother   . Bladder Cancer Father 23  . Lymphoma Paternal Aunt   . Osteoporosis Sister   . Colon cancer Neg Hx   . Rectal cancer Neg Hx   . Stomach cancer Neg Hx   . Diabetes Neg Hx   . Stroke Neg Hx       Marti Sleigh, MD 02/14/2018, 9:52 AM

## 2018-02-14 NOTE — Patient Instructions (Signed)
Plan to have a PET scan and CA 125 in three months with an appointment to meet with Dr. Fermin Schwab after.  Please call for any needs, concerns, or new symptoms.

## 2018-04-11 ENCOUNTER — Telehealth: Payer: Self-pay | Admitting: *Deleted

## 2018-04-11 NOTE — Telephone Encounter (Signed)
Patient called to verify her appointment with Dr. Fermin Schwab. I told her that her appointment is scheduled for Tuesday August 13th @10 :30, and that she also has lab work on August 12th at 7:45am and a Pet scan at [facility] on the 12th. I told patient to follow instructions for the PET scan that were given to her by our office.  Patient verbalized understanding.

## 2018-04-25 ENCOUNTER — Encounter: Payer: Self-pay | Admitting: Internal Medicine

## 2018-04-25 ENCOUNTER — Encounter: Payer: Medicare Other | Admitting: Internal Medicine

## 2018-04-25 ENCOUNTER — Ambulatory Visit (INDEPENDENT_AMBULATORY_CARE_PROVIDER_SITE_OTHER): Payer: Medicare Other | Admitting: Internal Medicine

## 2018-04-25 VITALS — BP 126/64 | HR 61 | Temp 98.3°F | Ht 63.75 in | Wt 129.2 lb

## 2018-04-25 DIAGNOSIS — F32A Depression, unspecified: Secondary | ICD-10-CM

## 2018-04-25 DIAGNOSIS — Z0000 Encounter for general adult medical examination without abnormal findings: Secondary | ICD-10-CM

## 2018-04-25 DIAGNOSIS — F419 Anxiety disorder, unspecified: Secondary | ICD-10-CM

## 2018-04-25 DIAGNOSIS — C569 Malignant neoplasm of unspecified ovary: Secondary | ICD-10-CM

## 2018-04-25 DIAGNOSIS — E7800 Pure hypercholesterolemia, unspecified: Secondary | ICD-10-CM

## 2018-04-25 DIAGNOSIS — E559 Vitamin D deficiency, unspecified: Secondary | ICD-10-CM | POA: Diagnosis not present

## 2018-04-25 DIAGNOSIS — F329 Major depressive disorder, single episode, unspecified: Secondary | ICD-10-CM

## 2018-04-25 DIAGNOSIS — M069 Rheumatoid arthritis, unspecified: Secondary | ICD-10-CM

## 2018-04-25 LAB — LIPID PANEL
CHOL/HDL RATIO: 4
CHOLESTEROL: 241 mg/dL — AB (ref 0–200)
HDL: 57.1 mg/dL (ref >39.00)
LDL CALC: 157 mg/dL — AB (ref 0–99)
NonHDL: 184.23
Triglycerides: 135 mg/dL (ref 0.0–149.0)
VLDL: 27 mg/dL (ref 0.0–40.0)

## 2018-04-25 LAB — COMPREHENSIVE METABOLIC PANEL
ALBUMIN: 4 g/dL (ref 3.5–5.2)
ALT: 23 U/L (ref 0–35)
AST: 18 U/L (ref 0–37)
Alkaline Phosphatase: 44 U/L (ref 39–117)
BUN: 22 mg/dL (ref 6–23)
CHLORIDE: 104 meq/L (ref 96–112)
CO2: 32 meq/L (ref 19–32)
CREATININE: 0.83 mg/dL (ref 0.40–1.20)
Calcium: 9 mg/dL (ref 8.4–10.5)
GFR: 72.57 mL/min (ref >60.00)
Glucose, Bld: 91 mg/dL (ref 70–99)
Potassium: 4.1 mEq/L (ref 3.5–5.1)
Sodium: 141 mEq/L (ref 135–145)
Total Bilirubin: 0.5 mg/dL (ref 0.2–1.2)
Total Protein: 6.8 g/dL (ref 6.0–8.3)

## 2018-04-25 LAB — CBC
HCT: 43 % (ref 36.0–46.0)
Hemoglobin: 14.1 g/dL (ref 12.0–15.0)
MCHC: 32.9 g/dL (ref 30.0–36.0)
MCV: 99.3 fl (ref 78.0–100.0)
Platelets: 242 10*3/uL (ref 150.0–400.0)
RBC: 4.33 Mil/uL (ref 3.87–5.11)
RDW: 13.7 % (ref 11.5–15.5)
WBC: 4.7 10*3/uL (ref 4.0–10.5)

## 2018-04-25 LAB — VITAMIN D 25 HYDROXY (VIT D DEFICIENCY, FRACTURES): VITD: 23.64 ng/mL — ABNORMAL LOW (ref 30.00–100.00)

## 2018-04-25 MED ORDER — SERTRALINE HCL 100 MG PO TABS: 100.0000 mg | ORAL_TABLET | Freq: Every day | ORAL | 3 refills | Status: DC

## 2018-04-25 MED ORDER — ESTAZOLAM 2 MG PO TABS: 1.0000 mg | ORAL_TABLET | Freq: Every day | ORAL | 0 refills | Status: DC

## 2018-04-25 NOTE — Progress Notes (Signed)
HPI:  Pt presents to the clinic today for her Medicare Wellness Exam. She is also due to follow up chronic conditions.   Anxiety and Depression: Managed on Sertraline 100 mg daily.  She takes akes Prosom 2mg  for sleep about every 3 days. She is no longer seeing Dr. Casimiro Needle.  HLD: Her last LDL was 139, 04/2017, She is taking Pravastatin and Fish Oil as prescribed.  She has been xperiencing increased leg cramps so she stopped taking Pravastatin 1 month ago.She has tried [employer] OTC.    History of Ovarian Cancer: Currently in remission. She follows with oncology.  IBS: Mainly constipation. She consumes a FODMAP diet. She does not take any medications for this.  RA:  She does not take any medications for this, just deals with the pain. She follows with Dr. Ouida Sills as needed with good relief..  Past Medical History:  Diagnosis Date  . Anxiety and depression    . DJD (degenerative joint disease)   . Elevated cholesterol   . Endometrial polyp   . History of radiation therapy 10/25/16-12/01/16   right pelvis/60.2 Gy in 28 fractions  . IRRITABLE BOWEL SYNDROME, HX OF 05/15/2008  . MVP (mitral valve prolapse)    occ palpitations   . Ovarian ca ([place]) 07-2012      . Rheumatoid arthritis(714.0) ~ 2010   dr Ouida Sills  . Shingles     Current Outpatient Medications  Medication Sig Dispense Refill  . docusate sodium (COLACE) 100 MG capsule Take 100 mg by mouth daily as needed.     . estazolam (PROSOM) 2 MG tablet Take 0.5-1 tablets (1-2 mg total) by mouth at bedtime. 30 tablet 0  . fish oil-omega-3 fatty acids 1000 MG capsule Take 1 g by mouth daily.      . Multiple Vitamin (MULTIVITAMIN WITH MINERALS) TABS Take 1 tablet by mouth daily.    . polyethylene glycol (MIRALAX / GLYCOLAX) packet Take 17 g by mouth daily.    . pravastatin (PRAVACHOL) 40 MG tablet Take 1 tablet (40 mg total) by mouth daily. 90 tablet 3  . sertraline (ZOLOFT) 100 MG tablet TAKE 1 TABLET BY MOUTH ONCE DAILY 90  tablet 0   No current facility-administered medications for this visit.     Allergies  Allergen Reactions  . Atorvastatin Other (See Comments)    Leg pain  . Macrodantin Other (See Comments)    Unknown Reaction  . Decongest-Aid [Pseudoephedrine] Palpitations    Family History  Problem Relation Age of Onset  . Heart disease Mother   . Bladder Cancer Father 54  . Lymphoma Paternal Aunt   . Osteoporosis Sister   . Colon cancer Neg Hx   . Rectal cancer Neg Hx   . Stomach cancer Neg Hx   . Diabetes Neg Hx   . Stroke Neg Hx     Social History   Socioeconomic History  . Marital status: Married    Spouse name: Not on file  . Number of children: 2  . Years of education: Not on file  . Highest education level: Not on file  Occupational History  . Occupation: disability d/t RA  Social Needs  . Financial resource strain: Not on file  . Food insecurity:    Worry: Not on file    Inability: Not on file  . Transportation needs:    Medical: Not on file    Non-medical: Not on file  Tobacco Use  . Smoking status: Never Smoker  . Smokeless tobacco:  Never Used  Substance and Sexual Activity  . Alcohol use: No  . Drug use: No  . Sexual activity: Never    Birth control/protection: Post-menopausal, Surgical  Lifestyle  . Physical activity:    Days per week: Not on file    Minutes per session: Not on file  . Stress: Not on file  Relationships  . Social connections:    Talks on phone: Not on file    Gets together: Not on file    Attends religious service: Not on file    Active member of club or organization: Not on file    Attends meetings of clubs or organizations: Not on file    Relationship status: Not on file  . Intimate partner violence:    Fear of current or ex partner: Not on file    Emotionally abused: Not on file    Physically abused: Not on file    Forced sexual activity: Not on file  Other Topics Concern  . Not on file  Social History Narrative   [name] [name] is her daughter    Household-- pr and husband    Hospitiliaztions: None  Health Maintenance:    Flu: 06/2016  Tetanus: 10/2014  Pneumovax: 10/2014  Prevnar: 03/2016  Zostavax: never  Shingrix: never- will check with insurance prior to obtaining  Mammogram: 01/2018  Pap Smear: hysterectomy total  Bone Density: 03/2016  Colon Screening: 07/2014- referral to GI for follow up  Eye Doctor: every 1-2 years, last 07/2017, [place] Exam: as needed. No dental insurance   Providers:   PCP: Webb Silversmith, NP-C  Oncologist: Dr. Ammie Dalton, Dr. Dianah Field  Psychiatrist: Dr. Ouida Sills  Gastroenterologist: Dr. Fuller Plan    I have personally reviewed and have noted:  1. The patient's medical and social history 2. Their use of alcohol, tobacco or illicit drugs 3. Their current medications and supplements 4. The patient's functional ability including ADL's, fall risks, home safety risks and hearing or visual impairment. 5. Diet and physical activities 6. Evidence for depression or mood disorder  Subjective:   Review of Systems:   Constitutional: Denies fever, malaise, fatigue, headache or abrupt weight changes.  HEENT: Denies eye pain, eye redness, ear pain, ringing in the ears, wax buildup, runny nose, nasal congestion, bloody nose, or sore throat. Respiratory: Denies difficulty breathing, shortness of breath, cough or sputum production.   Cardiovascular: Denies chest pain, chest tightness, palpitations or swelling in the hands or feet.  Gastrointestinal: Pt reports constipation. Denies abdominal pain, bloating, diarrhea or blood in the stool.  GU: Denies urgency, frequency, pain with urination, burning sensation, blood in urine, odor or discharge. Musculoskeletal: Pt reports intermittent joint pain. Denies decrease in range of motion, difficulty with gait, muscle pain or joint swelling.  Skin: Denies redness, rashes, lesions or ulcercations.  Neurological: Denies dizziness,  difficulty with memory, difficulty with speech or problems with balance and coordination.  Psych: Pt has a history of anxiety and depression. Denies SI/HI.  No other specific complaints in a complete review of systems (except as listed in HPI above).  Objective:  PE:   BP 126/64   Pulse 61   Temp 98.3 F (36.8 C) (Oral)   Ht 5' 3.75" (1.619 m)   Wt 129 lb 4 oz (58.6 kg)   SpO2 93%   BMI 22.36 kg/m   Wt Readings from Last 3 Encounters:  02/14/18 126 lb 11.2 oz (57.5 kg)  12/14/17 125 lb (56.7 kg)  11/11/17 124  lb 9.6 oz (56.5 kg)    General: Appears her stated age, well developed, well nourished in NAD. Skin: Warm, dry and intact. HEENT: Head: normal shape and size; Eyes: sclera white, no icterus, conjunctiva pink, PERRLA and EOMs intact; Ears: Tm's gray and intact, normal light reflex; Throat/Mouth: Teeth present, mucosa pink and moist, no exudate, lesions or ulcerations noted.  Neck: Neck supple, trachea midline. No masses, lumps or thyromegaly present.  Cardiovascular: Normal rate and rhythm. S1,S2 noted.  No murmur, rubs or gallops noted. No JVD or BLE edema. No carotid bruits noted. Pulmonary/Chest: Normal effort and positive vesicular breath sounds. No respiratory distress. No wheezes, rales or ronchi noted.  Abdomen: Soft and nontender. Normal bowel sounds. No distention or masses noted. Liver, spleen and kidneys non palpable. Musculoskeletal: Strength 5/5 BUE/BLE. No signs of joint swelling.  Neurological: Alert and oriented. Cranial nerves II-XII grossly intact. Coordination normal.  Psychiatric: Mood and affect normal. Behavior is normal. Judgment and thought content normal.     BMET    Component Value Date/Time   NA 142 09/20/2017 0940   K 4.7 09/20/2017 0940   CL 103 04/21/2017 1149   CL 104 01/02/2013 0821   CO2 28 09/20/2017 0940   GLUCOSE 91 09/20/2017 0940   GLUCOSE 86 01/02/2013 0821   BUN 14.2 09/20/2017 0940   CREATININE 0.9 09/20/2017 0940    CALCIUM 9.8 09/20/2017 0940   GFRNONAA >90 08/23/2012 0340   GFRAA >90 08/23/2012 0340    Lipid Panel     Component Value Date/Time   CHOL 226 (H) 04/21/2017 1149   TRIG 145.0 04/21/2017 1149   HDL 57.20 04/21/2017 1149   CHOLHDL 4 04/21/2017 1149   VLDL 29.0 04/21/2017 1149   LDLCALC 139 (H) 04/21/2017 1149    CBC    Component Value Date/Time   WBC 4.6 04/21/2017 1149   RBC 4.67 04/21/2017 1149   HGB 15.4 (H) 04/21/2017 1149   HGB 12.8 03/26/2013 0842   HCT 46.9 (H) 04/21/2017 1149   HCT 37.4 03/26/2013 0842   PLT 246.0 04/21/2017 1149   PLT 194 03/26/2013 0842   MCV 100.4 (H) 04/21/2017 1149   MCV 111.1 (H) 03/26/2013 0842   MCH 32.7 09/07/2016 0716   MCHC 32.9 04/21/2017 1149   RDW 13.2 04/21/2017 1149   RDW 14.5 03/26/2013 0842   LYMPHSABS 1.5 10/18/2014 1024   LYMPHSABS 1.0 03/26/2013 0842   MONOABS 0.6 10/18/2014 1024   MONOABS 0.5 03/26/2013 0842   EOSABS 0.1 10/18/2014 1024   EOSABS 0.1 03/26/2013 0842   BASOSABS 0.0 10/18/2014 1024   BASOSABS 0.0 03/26/2013 0842    Hgb A1C No results found for: HGBA1C    Assessment and Plan:   Medicare Annual Wellness Visit:  Diet: She consumes fruits, vegetables, meat daily. She limits sugary drinks and sweets.  She consumes fried foods occasionally. Physical activity: None Depression/mood screen: Negative Hearing: Intact to whispered voice Visual acuity: Grossly normal, performs annual eye exam  ADLs: Capable Fall risk: None Home safety: Good Cognitive evaluation: Intact to orientation, naming, recall and repetition EOL planning: Living Will, full code/ I agree.    Preventative Medicine: Encouraged her to get a flu shot in the fall. Tetanus UTD. Pneumovax and Prevnar UTD. She will call insurance company about Shingrix. Mammogram UTD. She no longer needs pap smears, Bone density UTD. Referral placed to GI for screening colonoscopy. Encouraged her to consume a balanced diet and exercise regimen. Advised her to  see an eye doctor  and dentist annually. . Will check CBC, CMET, Lipid and Vit D.    Next appointment: 1 year, Medicare Wellness Exam Webb Silversmith, NP    Webb Silversmith, NP

## 2018-04-25 NOTE — Patient Instructions (Signed)
Health Maintenance for Postmenopausal Women Menopause is a normal process in which your reproductive ability comes to an end. This process happens gradually over a span of months to years, usually between the ages of 22 and 9. Menopause is complete when you have missed 12 consecutive menstrual periods. It is important to talk with your health care provider about some of the most common conditions that affect postmenopausal women, such as heart disease, cancer, and bone loss (osteoporosis). Adopting a healthy lifestyle and getting preventive care can help to promote your health and wellness. Those actions can also lower your chances of developing some of these common conditions. What should I know about menopause? During menopause, you may experience a number of symptoms, such as:  Moderate-to-severe hot flashes.  Night sweats.  Decrease in sex drive.  Mood swings.  Headaches.  Tiredness.  Irritability.  Memory problems.  Insomnia.  Choosing to treat or not to treat menopausal changes is an individual decision that you make with your health care provider. What should I know about hormone replacement therapy and supplements? Hormone therapy products are effective for treating symptoms that are associated with menopause, such as hot flashes and night sweats. Hormone replacement carries certain risks, especially as you become older. If you are thinking about using estrogen or estrogen with progestin treatments, discuss the benefits and risks with your health care provider. What should I know about heart disease and stroke? Heart disease, heart attack, and stroke become more likely as you age. This may be due, in part, to the hormonal changes that your body experiences during menopause. These can affect how your body processes dietary fats, triglycerides, and cholesterol. Heart attack and stroke are both medical emergencies. There are many things that you can do to help prevent heart disease  and stroke:  Have your blood pressure checked at least every 1-2 years. High blood pressure causes heart disease and increases the risk of stroke.  If you are 53-22 years old, ask your health care provider if you should take aspirin to prevent a heart attack or a stroke.  Do not use any tobacco products, including cigarettes, chewing tobacco, or electronic cigarettes. If you need help quitting, ask your health care provider.  It is important to eat a healthy diet and maintain a healthy weight. ? Be sure to include plenty of vegetables, fruits, low-fat dairy products, and lean protein. ? Avoid eating foods that are high in solid fats, added sugars, or salt (sodium).  Get regular exercise. This is one of the most important things that you can do for your health. ? Try to exercise for at least 150 minutes each week. The type of exercise that you do should increase your heart rate and make you sweat. This is known as moderate-intensity exercise. ? Try to do strengthening exercises at least twice each week. Do these in addition to the moderate-intensity exercise.  Know your numbers.Ask your health care provider to check your cholesterol and your blood glucose. Continue to have your blood tested as directed by your health care provider.  What should I know about cancer screening? There are several types of cancer. Take the following steps to reduce your risk and to catch any cancer development as early as possible. Breast Cancer  Practice breast self-awareness. ? This means understanding how your breasts normally appear and feel. ? It also means doing regular breast self-exams. Let your health care provider know about any changes, no matter how small.  If you are 40  or older, have a clinician do a breast exam (clinical breast exam or CBE) every year. Depending on your age, family history, and medical history, it may be recommended that you also have a yearly breast X-ray (mammogram).  If you  have a family history of breast cancer, talk with your health care provider about genetic screening.  If you are at high risk for breast cancer, talk with your health care provider about having an MRI and a mammogram every year.  Breast cancer (BRCA) gene test is recommended for women who have family members with BRCA-related cancers. Results of the assessment will determine the need for genetic counseling and BRCA1 and for BRCA2 testing. BRCA-related cancers include these types: ? Breast. This occurs in males or females. ? Ovarian. ? Tubal. This may also be called fallopian tube cancer. ? Cancer of the abdominal or pelvic lining (peritoneal cancer). ? Prostate. ? Pancreatic.  Cervical, Uterine, and Ovarian Cancer Your health care provider may recommend that you be screened regularly for cancer of the pelvic organs. These include your ovaries, uterus, and vagina. This screening involves a pelvic exam, which includes checking for microscopic changes to the surface of your cervix (Pap test).  For women ages 21-65, health care providers may recommend a pelvic exam and a Pap test every three years. For women ages 79-65, they may recommend the Pap test and pelvic exam, combined with testing for human papilloma virus (HPV), every five years. Some types of HPV increase your risk of cervical cancer. Testing for HPV may also be done on women of any age who have unclear Pap test results.  Other health care providers may not recommend any screening for nonpregnant women who are considered low risk for pelvic cancer and have no symptoms. Ask your health care provider if a screening pelvic exam is right for you.  If you have had past treatment for cervical cancer or a condition that could lead to cancer, you need Pap tests and screening for cancer for at least 20 years after your treatment. If Pap tests have been discontinued for you, your risk factors (such as having a new sexual partner) need to be  reassessed to determine if you should start having screenings again. Some women have medical problems that increase the chance of getting cervical cancer. In these cases, your health care provider may recommend that you have screening and Pap tests more often.  If you have a family history of uterine cancer or ovarian cancer, talk with your health care provider about genetic screening.  If you have vaginal bleeding after reaching menopause, tell your health care provider.  There are currently no reliable tests available to screen for ovarian cancer.  Lung Cancer Lung cancer screening is recommended for adults 69-62 years old who are at high risk for lung cancer because of a history of smoking. A yearly low-dose CT scan of the lungs is recommended if you:  Currently smoke.  Have a history of at least 30 pack-years of smoking and you currently smoke or have quit within the past 15 years. A pack-year is smoking an average of one pack of cigarettes per day for one year.  Yearly screening should:  Continue until it has been 15 years since you quit.  Stop if you develop a health problem that would prevent you from having lung cancer treatment.  Colorectal Cancer  This type of cancer can be detected and can often be prevented.  Routine colorectal cancer screening usually begins at  age 42 and continues through age 45.  If you have risk factors for colon cancer, your health care provider may recommend that you be screened at an earlier age.  If you have a family history of colorectal cancer, talk with your health care provider about genetic screening.  Your health care provider may also recommend using home test kits to check for hidden blood in your stool.  A small camera at the end of a tube can be used to examine your colon directly (sigmoidoscopy or colonoscopy). This is done to check for the earliest forms of colorectal cancer.  Direct examination of the colon should be repeated every  5-10 years until age 71. However, if early forms of precancerous polyps or small growths are found or if you have a family history or genetic risk for colorectal cancer, you may need to be screened more often.  Skin Cancer  Check your skin from head to toe regularly.  Monitor any moles. Be sure to tell your health care provider: ? About any new moles or changes in moles, especially if there is a change in a mole's shape or color. ? If you have a mole that is larger than the size of a pencil eraser.  If any of your family members has a history of skin cancer, especially at a young age, talk with your health care provider about genetic screening.  Always use sunscreen. Apply sunscreen liberally and repeatedly throughout the day.  Whenever you are outside, protect yourself by wearing long sleeves, pants, a wide-brimmed hat, and sunglasses.  What should I know about osteoporosis? Osteoporosis is a condition in which bone destruction happens more quickly than new bone creation. After menopause, you may be at an increased risk for osteoporosis. To help prevent osteoporosis or the bone fractures that can happen because of osteoporosis, the following is recommended:  If you are 46-71 years old, get at least 1,000 mg of calcium and at least 600 mg of vitamin D per day.  If you are older than age 55 but younger than age 65, get at least 1,200 mg of calcium and at least 600 mg of vitamin D per day.  If you are older than age 54, get at least 1,200 mg of calcium and at least 800 mg of vitamin D per day.  Smoking and excessive alcohol intake increase the risk of osteoporosis. Eat foods that are rich in calcium and vitamin D, and do weight-bearing exercises several times each week as directed by your health care provider. What should I know about how menopause affects my mental health? Depression may occur at any age, but it is more common as you become older. Common symptoms of depression  include:  Low or sad mood.  Changes in sleep patterns.  Changes in appetite or eating patterns.  Feeling an overall lack of motivation or enjoyment of activities that you previously enjoyed.  Frequent crying spells.  Talk with your health care provider if you think that you are experiencing depression. What should I know about immunizations? It is important that you get and maintain your immunizations. These include:  Tetanus, diphtheria, and pertussis (Tdap) booster vaccine.  Influenza every year before the flu season begins.  Pneumonia vaccine.  Shingles vaccine.  Your health care provider may also recommend other immunizations. This information is not intended to replace advice given to you by your health care provider. Make sure you discuss any questions you have with your health care provider. Document Released: 11/12/2005  Document Revised: 04/09/2016 Document Reviewed: 06/24/2015 Elsevier Interactive Patient Education  2018 [employer]  

## 2018-04-28 ENCOUNTER — Encounter: Payer: Self-pay | Admitting: Internal Medicine

## 2018-04-28 NOTE — Assessment & Plan Note (Signed)
Stable off meds °Will monitor °

## 2018-04-28 NOTE — Assessment & Plan Note (Signed)
In remission She will continue to follow with oncology 

## 2018-04-28 NOTE — Assessment & Plan Note (Signed)
CMET and Lipid profile today May need to restart Pravastatin but will check labs first Encouraged her to consume a low fat diet

## 2018-04-28 NOTE — Assessment & Plan Note (Signed)
Sertraline and Prosom refilled today CMET today Support offered today

## 2018-05-05 MED ORDER — PRAVASTATIN SODIUM 40 MG PO TABS: 20.0000 mg | ORAL_TABLET | Freq: Every day | ORAL | 0 refills | Status: DC

## 2018-05-05 NOTE — Addendum Note (Signed)
Addended by: Lurlean Nanny on: 05/05/2018 01:31 PM   Modules accepted: Orders

## 2018-05-15 ENCOUNTER — Inpatient Hospital Stay: Payer: Medicare Other | Attending: Gynecology

## 2018-05-15 ENCOUNTER — Ambulatory Visit (HOSPITAL_COMMUNITY)
Admission: RE | Admit: 2018-05-15 | Discharge: 2018-05-15 | Disposition: A | Discharge status: Home / Self Care | Payer: Medicare Other | Source: Ambulatory Visit | Attending: Gynecologic Oncology | Admitting: Gynecologic Oncology

## 2018-05-15 DIAGNOSIS — Z90710 Acquired absence of both cervix and uterus: Secondary | ICD-10-CM | POA: Diagnosis not present

## 2018-05-15 DIAGNOSIS — C569 Malignant neoplasm of unspecified ovary: Secondary | ICD-10-CM

## 2018-05-15 DIAGNOSIS — Z9221 Personal history of antineoplastic chemotherapy: Secondary | ICD-10-CM | POA: Diagnosis not present

## 2018-05-15 DIAGNOSIS — Z8543 Personal history of malignant neoplasm of ovary: Secondary | ICD-10-CM | POA: Insufficient documentation

## 2018-05-15 DIAGNOSIS — Z90722 Acquired absence of ovaries, bilateral: Secondary | ICD-10-CM | POA: Insufficient documentation

## 2018-05-15 DIAGNOSIS — Z923 Personal history of irradiation: Secondary | ICD-10-CM | POA: Diagnosis not present

## 2018-05-15 DIAGNOSIS — I700 Atherosclerosis of aorta: Secondary | ICD-10-CM | POA: Insufficient documentation

## 2018-05-15 LAB — GLUCOSE, CAPILLARY: Glucose-Capillary: 82 mg/dL (ref 70–99)

## 2018-05-15 MED ORDER — FLUDEOXYGLUCOSE F - 18 (FDG) INJECTION
6.0000 | Freq: Once | INTRAVENOUS | Status: AC | PRN
  Administered 2018-05-15: 6 via INTRAVENOUS

## 2018-05-16 ENCOUNTER — Inpatient Hospital Stay (HOSPITAL_BASED_OUTPATIENT_CLINIC_OR_DEPARTMENT_OTHER): Payer: Medicare Other | Admitting: Gynecology

## 2018-05-16 ENCOUNTER — Encounter: Payer: Self-pay | Admitting: Gynecology

## 2018-05-16 VITALS — BP 116/69 | HR 68 | Temp 98.3°F | Resp 18 | Ht 64.0 in | Wt 132.0 lb

## 2018-05-16 DIAGNOSIS — C569 Malignant neoplasm of unspecified ovary: Secondary | ICD-10-CM

## 2018-05-16 DIAGNOSIS — Z8543 Personal history of malignant neoplasm of ovary: Secondary | ICD-10-CM | POA: Diagnosis not present

## 2018-05-16 DIAGNOSIS — Z90710 Acquired absence of both cervix and uterus: Secondary | ICD-10-CM

## 2018-05-16 DIAGNOSIS — Z9221 Personal history of antineoplastic chemotherapy: Secondary | ICD-10-CM

## 2018-05-16 DIAGNOSIS — Z90722 Acquired absence of ovaries, bilateral: Secondary | ICD-10-CM

## 2018-05-16 DIAGNOSIS — Z923 Personal history of irradiation: Secondary | ICD-10-CM

## 2018-05-16 LAB — CA 125: Cancer Antigen (CA) 125: 5.5 U/mL (ref 0.0–38.1)

## 2018-05-16 NOTE — Patient Instructions (Signed)
Plan to follow up in three months or sooner if needed.  We will obtain a CA 125 level before that visit.  Please call for any questions, concerns, or new symptoms.

## 2018-05-16 NOTE — Progress Notes (Signed)
Consult Note: Gyn-Onc   TANAKA GILLEN 67 y.o. female  No chief complaint on file.   Assessment: stage IIIc ovarian cancer (optimally debulked). Status post 6 cycles of carboplatin and Taxol given in a dose dense regimencompleted May 2014.  Pelvic lymph node recurrence in 2018 treated with radiation therapy completed 12/01/2016.  Recent PET scan shows no evidence of recurrent disease.  Recent Ca1 25 was 5.5 units/mL.  Clinically NED.  The patient herself reports that she feels better than she had since several years and has become quite active.  Plan  patient returns seem me in 3 months. . Ca1 25 will be obtained prior to that visit.  Interval history: .   Patient returns today as previously scheduled. Since her last visit she's done very well.  In fact she says that over the last 2 months she is felt better than she is ever felt in her life.  She is been very active getting out of [place]  On direct questioning she does have slight amount of right lower quadrant pain which is relieved when she has a bowel movement. Otherwise her appetite is good she has no GI GU or other pelvic symptoms. She's not having any sequelae from her recent radiation therapy. Overall her functional status is excellent.  Her most recent Ca1 25 was 5.1 units/mL.  UTM:LYYTKPT is a 67 year old gravida 2 para 2 who went through menopause at the age about 67 years of age. She never took any hormone replacement therapy. She states that for the past 6 months she's experienced some pelvic soreness in pressure. She has noted some increasing urinary frequency. With these symptoms she had a PET scan on July 12, 2012. It revealed significant soft tissue thickening seen throughout the greater omentum suspicious for intraperitoneal carcinoma. There is minimal ascites in the pelvis. it revealed the areas of omental and peritoneal nodularity were hypermetabolic. There is a dominant pelvic omental  mass measuring 10.4 x 3.3 cm. There is a small omental implants in the left side of the abdomen measuring 1.1 cm. There were multiple pelvic foci That wereperitoneal based And hypermetabolic. These included the cul-de-sac. She has small volume of ascites. She underwent a CT-guided biopsy on July 24, 2012. It revealed metastatic carcinoma. By immunohistochemistry, malignant cells were positive for WT 1, cytokeratin 7, estrogen receptor. There were negative for TTF-1, progesterone receptor, cytokeratin 20, CEA, CD X2, and S100. Overall, the histologic features were highly suggestive of a primary gynecologic origin. Her CA 125 on July 13, 2012 was elevated at 71.  She underwent exploratory laparotomy on 08/22/2012 findings of stage IIIC ovarian cancer. Patient was optimally debulked. Debulking, however, required a rectosigmoid resection as well as total omentectomy total nominal hysterectomy bilateral salpingo-oophorectomy. She was optimally debulked with only 5 mm nodules on the right diaphragm as residual disease.  SURGICAL FINDINGS: At exploratory laparotomy the infracolic omentum was nearly replaced with tumor. This measured approximately 12 x 16 cm. There were 5 mm nodules on the right diaphragm. The small bowel serosa and mesentery were normal. In the pelvis it appeared that the left ovary was a primary source. It was cystic and densely adherent to the sigmoid mesentery. The right ovary was approximately 4 cm in diameter and had multiple excrescences on it. There were extensive tumor implants on the bladder flap and involving the sigmoid mesentery and serosa, and posterior cul-de-sac. At the conclusion of the surgical procedure the only residual disease  were 5 mm nodules on the right diaphragm. Chemotherapy was initiated in December 2013 using the dose dense regimen of carboplatin and Taxol   She received 6 cycles of carboplatin and Taxol the last administered on 02/06/2013. At that time her CA 125 is  5.6 units per mL.  Patient was found to have recurrent disease in January 2018. It was confined to the right external iliac lymph node chain and subsequently treated with whole pelvis radiation therapy and a boost to the right pelvic sidewall.  A follow-up PET scan on October 31, 2017 was negative for any evidence of metastatic disease.  Follow-up PET scan in August 2019 was also negative.  Review of Systems:10 point review of systems is negative as noted above.   Vitals: Blood pressure 116/69, pulse 68, temperature 98.3 F (36.8 C), temperature source Oral, resp. rate 18, height 5\' 4"  (1.626 m), weight 132 lb (59.9 kg), SpO2 98 %.  Physical Exam: General : The patient is a healthy woman in no acute distress.  HEENT: normocephalic, extraoccular movements normal; neck is supple without thyromegally  Lynphnodes: Supraclavicular and inguinal nodes not enlarged   Abdomen: Soft, non-tender, no ascites, no organomegally, no masses, no hernias  Pelvic:  EGBUS: Normal female  Vagina: Normal, no lesions no evidence of bleeding. Urethra and Bladder: Normal, non-tender  Cervix: Surgically absent  Uterus: Surgically absent   bi-manual examination: No masses or nodularity..  Rectal: normal sphincter tone, no masses, no blood  Lower extremities: No edema or varicosities. Normal range of motion      Allergies  Allergen Reactions  . Atorvastatin Other (See Comments)    Leg pain  . Macrodantin Other (See Comments)    Unknown Reaction  . Decongest-Aid [Pseudoephedrine] Palpitations    Past Medical History:  Diagnosis Date  . Anxiety and depression    . DJD (degenerative joint disease)   . Elevated cholesterol   . Endometrial polyp   . History of radiation therapy 10/25/16-12/01/16   right pelvis/60.2 Gy in 28 fractions  . IRRITABLE BOWEL SYNDROME, HX OF 05/15/2008  . MVP (mitral valve prolapse)    occ palpitations   . Ovarian ca ([place]) 07-2012      . Rheumatoid arthritis(714.0) ~ 2010    dr Ouida Sills  . Shingles     Past Surgical History:  Procedure Laterality Date  . ABDOMINAL HYSTERECTOMY  08/22/2012   Procedure: HYSTERECTOMY ABDOMINAL;  Surgeon: Alvino Chapel, MD;  Location: [facility];  Service: Gynecology;  Laterality: N/A;  . BACK SURGERY  2008   Dr Trenton Gammon  . COLONOSCOPY  04-20-2004  . COLONOSCOPY W/ BIOPSIES  07/2014  . COLOSTOMY REVISION  08/22/2012   Procedure: COLON RESECTION SIGMOID;  Surgeon: Alvino Chapel, MD;  Location: [facility];  Service: Gynecology;;  Rectal Sigmoid resection and low rectal anastomosis  . CYST ON NECK  2011  . DEBULKING  08/22/2012   Procedure: DEBULKING;  Surgeon: Alvino Chapel, MD;  Location: [facility];  Service: Gynecology;  Laterality: N/A;  Radical tumor debulking, Bilateral Ureterolysis  . DILATION AND CURETTAGE OF UTERUS  2004   WITH HYSTEROSCOPY  . HAND SURGERY  1996  . HYSTEROSCOPY  2004   D&C  . NECK SURGERY  2009   [facility]  . OMENTECTOMY  08/22/2012   Procedure: OMENTECTOMY;  Surgeon: Alvino Chapel, MD;  Location: [facility];  Service: Gynecology;  Laterality: N/A;  . SALPINGOOPHORECTOMY  08/22/2012   Procedure: SALPINGO OOPHORECTOMY;  Surgeon: Alvino Chapel, MD;  Location: [facility];  Service: Gynecology;  Laterality: Bilateral;  . TUBAL LIGATION  1980    Current Outpatient Medications  Medication Sig Dispense Refill  . estazolam (PROSOM) 2 MG tablet Take 0.5-1 tablets (1-2 mg total) by mouth at bedtime. 30 tablet 0  . fish oil-omega-3 fatty acids 1000 MG capsule Take 1 g by mouth daily.      . Multiple Vitamin (MULTIVITAMIN WITH MINERALS) TABS Take 1 tablet by mouth daily.    . pravastatin (PRAVACHOL) 40 MG tablet Take 0.5 tablets (20 mg total) by mouth daily. 45 tablet 0  . sertraline (ZOLOFT) 100 MG tablet Take 1 tablet (100 mg total) by mouth daily. 90 tablet 3  . docusate sodium (COLACE) 100 MG capsule Take 100 mg by mouth daily as needed.     . polyethylene glycol (MIRALAX /  GLYCOLAX) packet Take 17 g by mouth daily.     No current facility-administered medications for this visit.     Social History   Socioeconomic History  . Marital status: Married    Spouse name: Not on file  . Number of children: 2  . Years of education: Not on file  . Highest education level: Not on file  Occupational History  . Occupation: disability d/t RA  Social Needs  . Financial resource strain: Not on file  . Food insecurity:    Worry: Not on file    Inability: Not on file  . Transportation needs:    Medical: Not on file    Non-medical: Not on file  Tobacco Use  . Smoking status: Never Smoker  . Smokeless tobacco: Never Used  Substance and Sexual Activity  . Alcohol use: No  . Drug use: No  . Sexual activity: Never    Birth control/protection: Post-menopausal, Surgical  Lifestyle  . Physical activity:    Days per week: Not on file    Minutes per session: Not on file  . Stress: Not on file  Relationships  . Social connections:    Talks on phone: Not on file    Gets together: Not on file    Attends religious service: Not on file    Active member of club or organization: Not on file    Attends meetings of clubs or organizations: Not on file    Relationship status: Not on file  . Intimate partner violence:    Fear of current or ex partner: Not on file    Emotionally abused: Not on file    Physically abused: Not on file    Forced sexual activity: Not on file  Other Topics Concern  . Not on file  Social History Narrative   [name] is her daughter    Household-- pr and husband    Family History  Problem Relation Age of Onset  . Heart disease Mother   . Bladder Cancer Father 39  . Lymphoma Paternal Aunt   . Osteoporosis Sister   . Colon cancer Neg Hx   . Rectal cancer Neg Hx   . Stomach cancer Neg Hx   . Diabetes Neg Hx   . Stroke Neg Hx       Marti Sleigh, MD 05/16/2018, 12:57 PM

## 2018-05-26 ENCOUNTER — Other Ambulatory Visit: Payer: Self-pay | Admitting: Internal Medicine

## 2018-05-26 NOTE — Telephone Encounter (Signed)
Last filled 04/25/2018.... Please advise

## 2018-06-14 ENCOUNTER — Telehealth: Payer: Self-pay | Admitting: Internal Medicine

## 2018-06-14 MED ORDER — SERTRALINE HCL 100 MG PO TABS: 100.0000 mg | ORAL_TABLET | Freq: Every day | ORAL | 3 refills | Status: DC

## 2018-06-14 NOTE — Telephone Encounter (Signed)
Copied from [place] [phone]. Topic: Quick Communication - Rx Refill/Question >> Jun 14, 2018  3:03 PM Reyne Dumas L wrote: Medication: sertraline (ZOLOFT) 100 MG tablet  Has the patient contacted their pharmacy? Yes - states new script needed (Agent: If no, request that the patient contact the pharmacy for the refill.) (Agent: If yes, when and what did the pharmacy advise?)  Preferred Pharmacy (with phone number or street name): [facility] ([address]), [place] [phone] (Phone) [phone] (Fax)  Agent: Please be advised that RX refills may take up to 3 business days. We ask that you follow-up with your pharmacy.

## 2018-07-06 ENCOUNTER — Telehealth: Payer: Self-pay

## 2018-07-06 NOTE — Telephone Encounter (Signed)
Copied from [place] [phone]. Topic: Appointment Scheduling - Scheduling Inquiry for Clinic >> Jul 05, 2018  2:30 PM Burchel, Abbi R wrote: Pt called to ask when her colonoscopy should be scheduled. Please call pt to advise.   [phone]

## 2018-07-19 ENCOUNTER — Telehealth: Payer: Self-pay | Admitting: Internal Medicine

## 2018-07-19 NOTE — Telephone Encounter (Signed)
Copied from [place] [phone]. Topic: General - Other >> Jul 19, 2018 12:48 PM Lennox Solders wrote: Reason for CRM: scott from [employer] is calling the estazolam has been approve from 07/17/18 to 07/18/19

## 2018-07-26 ENCOUNTER — Telehealth: Payer: Self-pay | Admitting: *Deleted

## 2018-07-26 NOTE — Telephone Encounter (Signed)
Called and spoke with the patient moved her appt from 11/12 at 2:45 pm to 10:30 am

## 2018-08-10 ENCOUNTER — Telehealth: Payer: Self-pay

## 2018-08-10 NOTE — Telephone Encounter (Signed)
Returned pt's call regarding when is her next appt.  Clarified her appt for lab tomorrow and next Tues is with [facility]. Pt voiced understanding.  No other needs per pt at this time.

## 2018-08-11 ENCOUNTER — Inpatient Hospital Stay: Payer: Medicare Other | Attending: Gynecology

## 2018-08-11 DIAGNOSIS — Z90710 Acquired absence of both cervix and uterus: Secondary | ICD-10-CM | POA: Insufficient documentation

## 2018-08-11 DIAGNOSIS — Z08 Encounter for follow-up examination after completed treatment for malignant neoplasm: Secondary | ICD-10-CM | POA: Diagnosis present

## 2018-08-11 DIAGNOSIS — Z923 Personal history of irradiation: Secondary | ICD-10-CM | POA: Insufficient documentation

## 2018-08-11 DIAGNOSIS — Z8543 Personal history of malignant neoplasm of ovary: Secondary | ICD-10-CM | POA: Diagnosis not present

## 2018-08-11 DIAGNOSIS — C569 Malignant neoplasm of unspecified ovary: Secondary | ICD-10-CM

## 2018-08-11 DIAGNOSIS — Z90722 Acquired absence of ovaries, bilateral: Secondary | ICD-10-CM | POA: Insufficient documentation

## 2018-08-12 LAB — CA 125: CANCER ANTIGEN (CA) 125: 5.9 U/mL (ref 0.0–38.1)

## 2018-08-15 ENCOUNTER — Inpatient Hospital Stay (HOSPITAL_BASED_OUTPATIENT_CLINIC_OR_DEPARTMENT_OTHER): Payer: Medicare Other | Admitting: Gynecology

## 2018-08-15 ENCOUNTER — Encounter: Payer: Self-pay | Admitting: Gynecology

## 2018-08-15 VITALS — BP 122/73 | HR 68 | Temp 97.9°F | Resp 20 | Ht 64.0 in | Wt 135.0 lb

## 2018-08-15 DIAGNOSIS — Z90710 Acquired absence of both cervix and uterus: Secondary | ICD-10-CM | POA: Diagnosis not present

## 2018-08-15 DIAGNOSIS — Z90722 Acquired absence of ovaries, bilateral: Secondary | ICD-10-CM | POA: Diagnosis not present

## 2018-08-15 DIAGNOSIS — Z08 Encounter for follow-up examination after completed treatment for malignant neoplasm: Secondary | ICD-10-CM

## 2018-08-15 DIAGNOSIS — Z8543 Personal history of malignant neoplasm of ovary: Secondary | ICD-10-CM

## 2018-08-15 DIAGNOSIS — C569 Malignant neoplasm of unspecified ovary: Secondary | ICD-10-CM

## 2018-08-15 DIAGNOSIS — Z923 Personal history of irradiation: Secondary | ICD-10-CM

## 2018-08-15 NOTE — Patient Instructions (Signed)
Plan to follow up in three months with a CA 125 before or sooner if needed.  Please call our office for any new symptoms or concerns.

## 2018-08-15 NOTE — Progress Notes (Signed)
Consult Note: Gyn-Onc   [name] [name] 67 y.o. female  No chief complaint on file.   Assessment: stage IIIc ovarian cancer (optimally debulked). Status post 6 cycles of carboplatin and Taxol given in a dose dense regimencompleted May 2014.  Pelvic lymph node recurrence in 2018 treated with radiation therapy completed 12/01/2016.   [name] [name]  Recent Ca1 25 was 5.9 units/mL (stable from prior values).  Clinically NED.  The patient herself reports that she feels better than she had since several years and has become quite active.  Plan  patient returns seem me in 3 months. . Ca1 25 will be obtained prior to that visit.  Interval history: .   Patient returns today as previously scheduled. Since her last visit she's done very well.  In fact she says that over the last 6 months she is felt better than she is ever felt in her life.  She is been very active getting out of the house, planning flowers, and attending yard sales.  Her appetite is good she has no GI GU or other pelvic symptoms. She's not having any sequelae from her recent radiation therapy. Overall her functional status is excellent.  Her most recent Ca1 25 was 5.9 units/mL (previously 5.5 units/mL).  [name] is a 67 year old gravida 2 para 2 who went through menopause at the age about 67 years of age. She never took any hormone replacement therapy. She states that for the past 6 months she's experienced some pelvic soreness in pressure. She has noted some increasing urinary frequency. With these symptoms she had a PET scan on July 12, 2012. It revealed significant soft tissue thickening seen throughout the greater omentum suspicious for intraperitoneal carcinoma. There is minimal ascites in the pelvis. it revealed the areas of omental and peritoneal nodularity were hypermetabolic. There is a dominant pelvic omental mass measuring 10.4 x 3.3 cm. There is a small omental implants in the left side of the abdomen measuring 1.1 cm. There were multiple  pelvic foci That wereperitoneal based And hypermetabolic. These included the cul-de-sac. She has small volume of ascites. She underwent a CT-guided biopsy on July 24, 2012. It revealed metastatic carcinoma. By immunohistochemistry, malignant cells were positive for WT 1, cytokeratin 7, estrogen receptor. There were negative for TTF-1, progesterone receptor, cytokeratin 20, CEA, CD X2, and S100. Overall, the histologic features were highly suggestive of a primary gynecologic origin. Her CA 125 on July 13, 2012 was elevated at 50.  She underwent exploratory laparotomy on 08/22/2012 findings of stage IIIC ovarian cancer. Patient was optimally debulked. Debulking, however, required a rectosigmoid resection as well as total omentectomy total nominal hysterectomy bilateral salpingo-oophorectomy. She was optimally debulked with only 5 mm nodules on the right diaphragm as residual disease.  SURGICAL FINDINGS: At exploratory laparotomy the infracolic omentum was nearly replaced with tumor. This measured approximately 12 x 16 cm. There were 5 mm nodules on the right diaphragm. The small bowel serosa and mesentery were normal. In the pelvis it appeared that the left ovary was a primary source. It was cystic and densely adherent to the sigmoid mesentery. The right ovary was approximately 4 cm in diameter and had multiple excrescences on it. There were extensive tumor implants on the bladder flap and involving the sigmoid mesentery and serosa, and posterior cul-de-sac. At the conclusion of the surgical procedure the only residual disease were 5 mm nodules on the right diaphragm. Chemotherapy was initiated in December 2013 using the dose dense regimen of carboplatin and Taxol  She received 6 cycles of carboplatin and Taxol the last administered on 02/06/2013. At that time her CA 125 is 5.6 units per mL.  Patient was found to have recurrent disease in January 2018. It was confined to the right external iliac lymph  node chain and subsequently treated with whole pelvis radiation therapy and a boost to the right pelvic sidewall.  A follow-up PET scan on October 31, 2017 was negative for any evidence of metastatic disease.  Follow-up PET scan in August 2019 was also negative.  Review of Systems:10 point review of systems is negative as noted above.   Vitals: Blood pressure 122/73, pulse 68, temperature 97.9 F (36.6 C), temperature source Oral, resp. rate 20, height 5\' 4"  (1.626 m), weight 135 lb (61.2 kg), SpO2 99 %.  Physical Exam: General : The patient is a healthy woman in no acute distress.  HEENT: normocephalic, extraoccular movements normal; neck is supple without thyromegally  Lynphnodes: Supraclavicular and inguinal nodes not enlarged   Abdomen: Soft, non-tender, no ascites, no organomegally, no masses, no hernias  Pelvic:  EGBUS: Normal female  Vagina: Normal, no lesions no evidence of bleeding. Urethra and Bladder: Normal, non-tender  Cervix: Surgically absent  Uterus: Surgically absent   bi-manual examination: No masses or nodularity..  Rectal: normal sphincter tone, no masses, no blood  Lower extremities: No edema or varicosities. Normal range of motion      Allergies  Allergen Reactions  . Atorvastatin Other (See Comments)    Leg pain  . Macrodantin Other (See Comments)    Unknown Reaction  . Decongest-Aid [Pseudoephedrine] Palpitations    Past Medical History:  Diagnosis Date  . Anxiety and depression    . DJD (degenerative joint disease)   . Elevated cholesterol   . Endometrial polyp   . History of radiation therapy 10/25/16-12/01/16   right pelvis/60.2 Gy in 28 fractions  . IRRITABLE BOWEL SYNDROME, HX OF 05/15/2008  . MVP (mitral valve prolapse)    occ palpitations   . Ovarian ca ([place]) 07-2012      . Rheumatoid arthritis(714.0) ~ 2010   dr Ouida Sills  . Shingles     Past Surgical History:  Procedure Laterality Date  . ABDOMINAL HYSTERECTOMY  08/22/2012    Procedure: HYSTERECTOMY ABDOMINAL;  Surgeon: Alvino Chapel, MD;  Location: [facility];  Service: Gynecology;  Laterality: N/A;  . BACK SURGERY  2008   Dr Trenton Gammon  . COLONOSCOPY  04-20-2004  . COLONOSCOPY W/ BIOPSIES  07/2014  . COLOSTOMY REVISION  08/22/2012   Procedure: COLON RESECTION SIGMOID;  Surgeon: Alvino Chapel, MD;  Location: [facility];  Service: Gynecology;;  Rectal Sigmoid resection and low rectal anastomosis  . CYST ON NECK  2011  . DEBULKING  08/22/2012   Procedure: DEBULKING;  Surgeon: Alvino Chapel, MD;  Location: [facility];  Service: Gynecology;  Laterality: N/A;  Radical tumor debulking, Bilateral Ureterolysis  . DILATION AND CURETTAGE OF UTERUS  2004   WITH HYSTEROSCOPY  . HAND SURGERY  1996  . HYSTEROSCOPY  2004   D&C  . NECK SURGERY  2009   SPURS  . OMENTECTOMY  08/22/2012   Procedure: OMENTECTOMY;  Surgeon: Alvino Chapel, MD;  Location: [facility];  Service: Gynecology;  Laterality: N/A;  . SALPINGOOPHORECTOMY  08/22/2012   Procedure: SALPINGO OOPHORECTOMY;  Surgeon: Alvino Chapel, MD;  Location: [facility];  Service: Gynecology;  Laterality: Bilateral;  . TUBAL LIGATION  1980    Current Outpatient Medications  Medication Sig Dispense  Refill  . docusate sodium (COLACE) 100 MG capsule Take 100 mg by mouth daily as needed.     . estazolam (PROSOM) 2 MG tablet TAKE 1/2 TO 1 (ONE-HALF TO ONE) TABLET BY MOUTH AT BEDTIME 30 tablet 0  . fish oil-omega-3 fatty acids 1000 MG capsule Take 1 g by mouth daily.      . Multiple Vitamin (MULTIVITAMIN WITH MINERALS) TABS Take 1 tablet by mouth daily.    . polyethylene glycol (MIRALAX / GLYCOLAX) packet Take 17 g by mouth daily.    . pravastatin (PRAVACHOL) 40 MG tablet Take 0.5 tablets (20 mg total) by mouth daily. 45 tablet 0  . sertraline (ZOLOFT) 100 MG tablet Take 1 tablet (100 mg total) by mouth daily. 90 tablet 3   No current facility-administered medications for this visit.     Social  History   Socioeconomic History  . Marital status: Married    Spouse name: Not on file  . Number of children: 2  . Years of education: Not on file  . Highest education level: Not on file  Occupational History  . Occupation: disability d/t RA  Social Needs  . Financial resource strain: Not on file  . Food insecurity:    Worry: Not on file    Inability: Not on file  . Transportation needs:    Medical: Not on file    Non-medical: Not on file  Tobacco Use  . Smoking status: Never Smoker  . Smokeless tobacco: Never Used  Substance and Sexual Activity  . Alcohol use: No  . Drug use: No  . Sexual activity: Never    Birth control/protection: Post-menopausal, Surgical  Lifestyle  . Physical activity:    Days per week: Not on file    Minutes per session: Not on file  . Stress: Not on file  Relationships  . Social connections:    Talks on phone: Not on file    Gets together: Not on file    Attends religious service: Not on file    Active member of club or organization: Not on file    Attends meetings of clubs or organizations: Not on file    Relationship status: Not on file  . Intimate partner violence:    Fear of current or ex partner: Not on file    Emotionally abused: Not on file    Physically abused: Not on file    Forced sexual activity: Not on file  Other Topics Concern  . Not on file  Social History Narrative   [name] is her daughter    Household-- pr and husband    Family History  Problem Relation Age of Onset  . Heart disease Mother   . Bladder Cancer Father 45  . Lymphoma Paternal Aunt   . Osteoporosis Sister   . Colon cancer Neg Hx   . Rectal cancer Neg Hx   . Stomach cancer Neg Hx   . Diabetes Neg Hx   . Stroke Neg Hx       Marti Sleigh, MD 08/15/2018, 11:14 AM

## 2018-09-12 DIAGNOSIS — D485 Neoplasm of uncertain behavior of skin: Secondary | ICD-10-CM | POA: Diagnosis not present

## 2018-09-12 DIAGNOSIS — L820 Inflamed seborrheic keratosis: Secondary | ICD-10-CM | POA: Diagnosis not present

## 2018-09-29 ENCOUNTER — Telehealth: Payer: Self-pay

## 2018-09-29 ENCOUNTER — Ambulatory Visit: Payer: Medicare Other | Admitting: Nurse Practitioner

## 2018-09-29 ENCOUNTER — Encounter: Payer: Self-pay | Admitting: Nurse Practitioner

## 2018-09-29 VITALS — BP 110/70 | HR 70 | Temp 97.5°F | Ht 64.0 in | Wt 130.6 lb

## 2018-09-29 DIAGNOSIS — R112 Nausea with vomiting, unspecified: Secondary | ICD-10-CM

## 2018-09-29 DIAGNOSIS — K521 Toxic gastroenteritis and colitis: Secondary | ICD-10-CM

## 2018-09-29 MED ORDER — BISMUTH SUBSALICYLATE 262 MG/15ML PO SUSP: 30.0000 mL | Freq: Three times a day (TID) | ORAL | 0 refills | Status: DC

## 2018-09-29 MED ORDER — GI COCKTAIL ~~LOC~~: 20.0000 mL | Freq: Once | ORAL | 0 refills | Status: AC

## 2018-09-29 MED ORDER — ONDANSETRON HCL 4 MG PO TABS: 4.0000 mg | ORAL_TABLET | Freq: Three times a day (TID) | ORAL | 0 refills | Status: DC | PRN

## 2018-09-29 MED FILL — ONDANSETRON HCL 4 MG TABLET: 4 | 6 days supply | Qty: 20 | Fill #0

## 2018-09-29 NOTE — Patient Instructions (Signed)
Maintain clear liquid diet for 2days,then advance to BRAT diet as tolerated  Maintain adequate oral hydration with water and Gatorade.  Food Poisoning Food poisoning is an illness that is caused by eating or drinking contaminated foods or drinks. In most cases, food poisoning is mild and lasts 1-2 days. However, some cases can be serious, especially for people who have weak body defense (immune) systems, older people, children and infants, and pregnant women. What are the causes? Foods can become contaminated with viruses, bacteria, parasites, mold, or chemicals as a result of:  Poor personal hygiene, such as poor hand washing practices.  Storing food improperly, such as not refrigerating raw meat.  Using unclean surfaces for serving, preparing, and storing food.  Cooking or eating with unclean utensils. If contaminated food is eaten, viruses, bacteria, or parasites can harm the intestine. This often causes severe diarrhea. The most common causes of food poisoning include:  Viruses, such as: ? Norovirus. ? Rotavirus.  Bacteria, such as: ? Salmonella. ? Listeria. ? E. coli (Escherichia coli).  Parasites, such as: ? Giardia. ? Toxoplasmosis. What are the signs or symptoms? Symptoms may take several hours to appear after you consume contaminated food or drink. Symptoms include:  Nausea.  Vomiting.  Cramping.  Diarrhea.  Fever and chills.  Muscle aches.  Dehydration. Dehydration can cause you to be tired and thirsty, have a dry mouth, and urinate less frequently. How is this diagnosed? Your health care provider can diagnose food poisoning with a medical history and physical exam. This will include asking you what you have recently eaten. You may also have tests, including:  Blood tests.  Stool tests. How is this treated? Treatment focuses on relieving your symptoms and making sure that you are hydrated. You may also be given medicines. In severe cases, hospitalization  may be required and you may need to receive fluids through an IV tube. Follow these instructions at home: Eating and drinking   Drink enough fluids to keep your urine clear or pale yellow. You may need to drink small amounts of clear liquids frequently.  Avoid milk, caffeine, and alcohol.  Ask your health care provider for specific rehydration instructions.  Eat small, frequent meals rather than large meals. Medicines  Take over-the-counter and prescription medicines only as told by your health care provider. Ask your health care provider if you should continue to take any of your regular prescribed and over-the-counter medicines.  If you were prescribed an antibiotic medicine, take it as told by your health care provider. Do not stop taking the antibiotic even if you start to feel better. General instructions  Wash your hands thoroughly before you prepare food and after you go to the bathroom (use the toilet). Make sure people who live with you also wash their hands often.  Clean surfaces that you touch with a product that contains chlorine bleach.  Keep all follow-up visits as told by your health care provider. This is important. How is this prevented?  Wash your hands, food preparation surfaces, and utensils thoroughly before and after you handle raw foods.  Use separate food preparation surfaces and storage spaces for raw meat and for fruits and vegetables.  Keep refrigerated foods colder than 50F (5C).  Serve hot foods immediately or keep them heated above 150F (60C).  Store dry foods in cool, dry spaces away from excess heat or moisture. Throw out any foods that do not smell right or are in cans that are bulging.  Follow approved canning procedures.  Heat canned foods thoroughly before you taste them.  Drink bottled or sterile water when you travel. Get help right away if: Call 911 or go to the emergency room if:  You have difficulty breathing, swallowing,  talking, or moving.  You develop blurred vision.  You cannot eat or drink without vomiting.  You faint.  Your eyes turn yellow.  Your vomiting or diarrhea is persistent.  Abdominal pain develops, increases, or localizes in one small area.  You have a fever.  You have blood or mucus in your stools, or your stools look dark black and tarry.  You have signs of dehydration, such as: ? Dark urine, very little urine, or no urine. ? Cracked lips. ? Not making tears while crying. ? Dry mouth. ? Sunken eyes. ? Sleepiness. ? Weakness. ? Dizziness. This information is not intended to replace advice given to you by your health care provider. Make sure you discuss any questions you have with your health care provider. Document Released: 06/18/2004 Document Revised: 02/17/2016 Document Reviewed: 03/24/2015 Elsevier Interactive Patient Education  2019 [employer].

## 2018-09-29 NOTE — Telephone Encounter (Signed)
Spoke with [facility] they going to mix it and contact the pt.

## 2018-09-29 NOTE — Telephone Encounter (Signed)
Copied from [place] [id number]. Topic: General - Other >> Sep 29, 2018  3:31 PM Oneta Rack wrote: Relation to pt: self  Call back number: [phone]   Reason for call:  [place] ([address]), [place] [phone] (Phone)  [phone] (Fax and [place], [place] Do not have the following ingredients to make Alum & Mag Hydroxide-Simeth (GI COCKTAIL) SUSP suspension, please advise

## 2018-09-29 NOTE — Progress Notes (Signed)
Subjective:  Patient ID: [name] [name], female    DOB: [birth year]  Age: 67 y.o. MRN: [id number]  CC: Emesis (started wed night went out to eat with son--not sure if what she ate?/ diarrhea watery, vomiting, stomach cramps,nausea/ son sick also)   Diarrhea   This is a new problem. The current episode started in the past 7 days. The problem occurs 5 to 10 times per day. The problem has been unchanged. The stool consistency is described as watery. The patient states that diarrhea awakens her from sleep. Associated symptoms include abdominal pain, bloating, increased flatus, myalgias and vomiting. Pertinent negatives include no arthralgias, chills, coughing, fever, headaches, sweats, URI or weight loss. Risk factors include suspect food intake (ate fish at restaurant in [place], [place]). She has tried bismuth subsalicylate and increased fluids for the symptoms. The treatment provided mild relief.   Reviewed past Medical, Social and Family history today.  Outpatient Medications Prior to Visit  Medication Sig Dispense Refill  . docusate sodium (COLACE) 100 MG capsule Take 100 mg by mouth daily as needed.     . estazolam (PROSOM) 2 MG tablet TAKE 1/2 TO 1 (ONE-HALF TO ONE) TABLET BY MOUTH AT BEDTIME 30 tablet 0  . fish oil-omega-3 fatty acids 1000 MG capsule Take 1 g by mouth daily.      . Multiple Vitamin (MULTIVITAMIN WITH MINERALS) TABS Take 1 tablet by mouth daily.    . polyethylene glycol (MIRALAX / GLYCOLAX) packet Take 17 g by mouth daily.    . pravastatin (PRAVACHOL) 40 MG tablet Take 0.5 tablets (20 mg total) by mouth daily. 45 tablet 0  . sertraline (ZOLOFT) 100 MG tablet Take 1 tablet (100 mg total) by mouth daily. 90 tablet 3   No facility-administered medications prior to visit.     ROS See HPI  Objective:  BP 110/70   Pulse 70   Temp (!) 97.5 F (36.4 C) (Oral)   Ht 5\' 4"  (1.626 m)   Wt 130 lb 9.6 oz (59.2 kg)   SpO2 99%   BMI 22.42 kg/m   BP Readings from Last 3  Encounters:  09/29/18 110/70  08/15/18 122/73  05/16/18 116/69    Wt Readings from Last 3 Encounters:  09/29/18 130 lb 9.6 oz (59.2 kg)  08/15/18 135 lb (61.2 kg)  05/16/18 132 lb (59.9 kg)    Physical Exam Constitutional:      General: She is not in acute distress.    Appearance: She is not toxic-appearing or diaphoretic.  Abdominal:     General: Bowel sounds are increased.     Palpations: Abdomen is soft.     Tenderness: There is generalized abdominal tenderness. There is no guarding or rebound.  Neurological:     General: No focal deficit present.     Mental Status: She is alert and oriented to person, place, and time.  Psychiatric:        Mood and Affect: Mood normal.        Behavior: Behavior normal.        Thought Content: Thought content normal.     Lab Results  Component Value Date   WBC 4.7 04/25/2018   HGB 14.1 04/25/2018   HCT 43.0 04/25/2018   PLT 242.0 04/25/2018   GLUCOSE 91 04/25/2018   CHOL 241 (H) 04/25/2018   TRIG 135.0 04/25/2018   HDL 57.10 04/25/2018   LDLDIRECT 157.5 09/11/2013   LDLCALC 157 (H) 04/25/2018   ALT 23 04/25/2018   AST  18 04/25/2018   NA 141 04/25/2018   K 4.1 04/25/2018   CL 104 04/25/2018   CREATININE 0.83 04/25/2018   BUN 22 04/25/2018   CO2 32 04/25/2018   TSH 2.70 09/11/2013   INR 0.93 09/07/2016    Nm Pet Image Restag (ps) Skull Base To Thigh  Result Date: 05/15/2018 CLINICAL DATA:  Subsequent treatment strategy for ovarian cancer. EXAM: NUCLEAR MEDICINE PET SKULL BASE TO THIGH TECHNIQUE: 6.4 mCi F-18 FDG was injected intravenously. Full-ring PET imaging was performed from the skull base to thigh after the radiotracer. CT data was obtained and used for attenuation correction and anatomic localization. Fasting blood glucose: 82 mg/dl COMPARISON:  10/31/2017. FINDINGS: Mediastinal blood pool activity: SUV max 2.7 NECK: No hypermetabolic lymph nodes. Incidental CT findings: None. CHEST: No hypermetabolic mediastinal, hilar  or axillary lymph nodes. No hypermetabolic pulmonary nodules. Incidental CT findings: Atherosclerotic calcification of the arterial vasculature. Heart is mildly enlarged. No pericardial or pleural effusion. ABDOMEN/PELVIS: No abnormal hypermetabolism in the liver, adrenal glands, spleen or pancreas. No hypermetabolic lymph nodes. Incidental CT findings: Tiny right renal stones. SKELETON: No abnormal osseous hypermetabolism. Incidental CT findings: Degenerative changes in the spine. IMPRESSION: 1. No evidence recurrent or metastatic disease. 2.  Aortic atherosclerosis (ICD10-170.0). Electronically Signed   By: Lorin Picket M.D.   On: 05/15/2018 10:39    Assessment & Plan:   [name] [name] was seen today for emesis.  Diagnoses and all orders for this visit:  Gastroenteritis due to toxin in food -     Alum & Mag Hydroxide-Simeth (GI COCKTAIL) SUSP suspension; Take 20 mLs by mouth once for 1 dose. Shake well. -     bismuth subsalicylate (PEPTO-BISMOL) 262 MG/15ML suspension; Take 30 mLs by mouth 4 (four) times daily -  before meals and at bedtime. -     ondansetron (ZOFRAN) 4 MG tablet; Take 1 tablet (4 mg total) by mouth every 8 (eight) hours as needed for nausea or vomiting.  Intractable vomiting with nausea, unspecified vomiting type -     Alum & Mag Hydroxide-Simeth (GI COCKTAIL) SUSP suspension; Take 20 mLs by mouth once for 1 dose. Shake well. -     bismuth subsalicylate (PEPTO-BISMOL) 262 MG/15ML suspension; Take 30 mLs by mouth 4 (four) times daily -  before meals and at bedtime. -     ondansetron (ZOFRAN) 4 MG tablet; Take 1 tablet (4 mg total) by mouth every 8 (eight) hours as needed for nausea or vomiting.   I am having [name] [name] start on gi cocktail, bismuth subsalicylate, and ondansetron. I am also having her maintain her fish oil-omega-3 fatty acids, multivitamin with minerals, docusate sodium, polyethylene glycol, pravastatin, estazolam, and sertraline.  Meds ordered this encounter    Medications  . Alum & Mag Hydroxide-Simeth (GI COCKTAIL) SUSP suspension    Sig: Take 20 mLs by mouth once for 1 dose. Shake well.    Dispense:  20 mL    Refill:  0    Order Specific Question:   Supervising Provider    Answer:   MATTHEWS, CODY [[id number]]  . bismuth subsalicylate (PEPTO-BISMOL) 262 MG/15ML suspension    Sig: Take 30 mLs by mouth 4 (four) times daily -  before meals and at bedtime.    Dispense:  360 mL    Refill:  0    Order Specific Question:   Supervising Provider    Answer:   MATTHEWS, CODY [[id number]]  . ondansetron (ZOFRAN) 4 MG tablet    Sig: Take  1 tablet (4 mg total) by mouth every 8 (eight) hours as needed for nausea or vomiting.    Dispense:  20 tablet    Refill:  0    Order Specific Question:   Supervising Provider    Answer:   MATTHEWS, CODY [[id number]]    Problem List Items Addressed This Visit    None    Visit Diagnoses    Gastroenteritis due to toxin in food    -  Primary   Relevant Medications   Alum & Mag Hydroxide-Simeth (GI COCKTAIL) SUSP suspension   bismuth subsalicylate (PEPTO-BISMOL) 262 MG/15ML suspension   ondansetron (ZOFRAN) 4 MG tablet   Intractable vomiting with nausea, unspecified vomiting type       Relevant Medications   Alum & Mag Hydroxide-Simeth (GI COCKTAIL) SUSP suspension   bismuth subsalicylate (PEPTO-BISMOL) 262 MG/15ML suspension   ondansetron (ZOFRAN) 4 MG tablet       Follow-up: No follow-ups on file.  Wilfred Lacy, NP

## 2018-11-13 ENCOUNTER — Inpatient Hospital Stay: Payer: Medicare Other | Attending: Gynecology

## 2018-11-13 DIAGNOSIS — Z8543 Personal history of malignant neoplasm of ovary: Secondary | ICD-10-CM | POA: Insufficient documentation

## 2018-11-13 DIAGNOSIS — Z08 Encounter for follow-up examination after completed treatment for malignant neoplasm: Secondary | ICD-10-CM | POA: Insufficient documentation

## 2018-11-13 DIAGNOSIS — Z9221 Personal history of antineoplastic chemotherapy: Secondary | ICD-10-CM | POA: Insufficient documentation

## 2018-11-13 DIAGNOSIS — Z923 Personal history of irradiation: Secondary | ICD-10-CM | POA: Diagnosis not present

## 2018-11-13 DIAGNOSIS — Z90722 Acquired absence of ovaries, bilateral: Secondary | ICD-10-CM | POA: Diagnosis not present

## 2018-11-13 DIAGNOSIS — C569 Malignant neoplasm of unspecified ovary: Secondary | ICD-10-CM

## 2018-11-13 DIAGNOSIS — Z90710 Acquired absence of both cervix and uterus: Secondary | ICD-10-CM | POA: Insufficient documentation

## 2018-11-14 ENCOUNTER — Encounter: Payer: Self-pay | Admitting: Gynecology

## 2018-11-14 ENCOUNTER — Other Ambulatory Visit: Payer: Self-pay | Admitting: Gynecologic Oncology

## 2018-11-14 ENCOUNTER — Inpatient Hospital Stay (HOSPITAL_BASED_OUTPATIENT_CLINIC_OR_DEPARTMENT_OTHER): Payer: Medicare Other | Admitting: Gynecology

## 2018-11-14 VITALS — BP 112/72 | HR 72 | Temp 97.9°F | Resp 18 | Ht 64.0 in | Wt 138.1 lb

## 2018-11-14 DIAGNOSIS — Z8543 Personal history of malignant neoplasm of ovary: Secondary | ICD-10-CM | POA: Diagnosis not present

## 2018-11-14 DIAGNOSIS — Z90722 Acquired absence of ovaries, bilateral: Secondary | ICD-10-CM | POA: Diagnosis not present

## 2018-11-14 DIAGNOSIS — Z90710 Acquired absence of both cervix and uterus: Secondary | ICD-10-CM

## 2018-11-14 DIAGNOSIS — Z9221 Personal history of antineoplastic chemotherapy: Secondary | ICD-10-CM | POA: Diagnosis not present

## 2018-11-14 DIAGNOSIS — C569 Malignant neoplasm of unspecified ovary: Secondary | ICD-10-CM

## 2018-11-14 DIAGNOSIS — Z08 Encounter for follow-up examination after completed treatment for malignant neoplasm: Secondary | ICD-10-CM | POA: Diagnosis not present

## 2018-11-14 DIAGNOSIS — Z923 Personal history of irradiation: Secondary | ICD-10-CM | POA: Diagnosis not present

## 2018-11-14 LAB — CA 125: CANCER ANTIGEN (CA) 125: 6.7 U/mL (ref 0.0–38.1)

## 2018-11-14 NOTE — Patient Instructions (Signed)
Plan to follow up in six months with a CA 125 before.

## 2018-11-14 NOTE — Progress Notes (Signed)
Consult Note: Gyn-Onc   [name] [name] 68 y.o. female  No chief complaint on file.   Assessment: stage IIIc ovarian cancer (optimally debulked). Status post 6 cycles of carboplatin and Taxol given in a dose dense regimencompleted May 2014.  Pelvic lymph node recurrence in 2018 treated with radiation therapy completed 12/01/2016.   [name]  Recent Ca1 25 was 6.7 units/mL (stable from prior values).  Clinically NED.    Plan  patient returns seem me in 6 months. Ca1 25 will be obtained prior to that visit.  Interval history: .   Patient returns today as previously scheduled. Since her last visit she's done very well.  In fact she says that over the last 6 months she is felt better than she is ever felt in her life.  She separated from her husband approximately 2 months ago which has taken a lot of stress out of her life.  She is been very active getting out of the house, planning flowers, and attending yard sales.  She is planning a trip to [facility] with her 4 sisters and daughter next month.  Her appetite is good she has no GI GU or other pelvic symptoms. She's not having any sequelae from radiation therapy. Overall her functional status is excellent.  Her most recent Ca1 25 was 6.7 units/mL (previously 5.9 units/mL).  [name] is a 68 year old gravida 2 para 2 who went through menopause at the age about 68 years of age. She never took any hormone replacement therapy. She states that for the past 6 months she's experienced some pelvic soreness in pressure. She has noted some increasing urinary frequency. With these symptoms she had a PET scan on July 12, 2012. It revealed significant soft tissue thickening seen throughout the greater omentum suspicious for intraperitoneal carcinoma. There is minimal ascites in the pelvis. it revealed the areas of omental and peritoneal nodularity were hypermetabolic. There is a dominant pelvic omental mass measuring 10.4 x 3.3 cm. There is a small omental implants in  the left side of the abdomen measuring 1.1 cm. There were multiple pelvic foci That wereperitoneal based And hypermetabolic. These included the cul-de-sac. She has small volume of ascites. She underwent a CT-guided biopsy on July 24, 2012. It revealed metastatic carcinoma. By immunohistochemistry, malignant cells were positive for WT 1, cytokeratin 7, estrogen receptor. There were negative for TTF-1, progesterone receptor, cytokeratin 20, CEA, CD X2, and S100. Overall, the histologic features were highly suggestive of a primary gynecologic origin. Her CA 125 on July 13, 2012 was elevated at 12.  She underwent exploratory laparotomy on 08/22/2012 findings of stage IIIC ovarian cancer. Patient was optimally debulked. Debulking, however, required a rectosigmoid resection as well as total omentectomy total nominal hysterectomy bilateral salpingo-oophorectomy. She was optimally debulked with only 5 mm nodules on the right diaphragm as residual disease.  SURGICAL FINDINGS: At exploratory laparotomy the infracolic omentum was nearly replaced with tumor. This measured approximately 12 x 16 cm. There were 5 mm nodules on the right diaphragm. The small bowel serosa and mesentery were normal. In the pelvis it appeared that the left ovary was a primary source. It was cystic and densely adherent to the sigmoid mesentery. The right ovary was approximately 4 cm in diameter and had multiple excrescences on it. There were extensive tumor implants on the bladder flap and involving the sigmoid mesentery and serosa, and posterior cul-de-sac. At the conclusion of the surgical procedure the only residual disease were 5 mm nodules on the right  diaphragm. Chemotherapy was initiated in December 2013 using the dose dense regimen of carboplatin and Taxol   She received 6 cycles of carboplatin and Taxol the last administered on 02/06/2013. At that time her CA 125 is 5.6 units per mL.  Patient was found to have recurrent disease in  January 2018. It was confined to the right external iliac lymph node chain and subsequently treated with whole pelvis radiation therapy and a boost to the right pelvic sidewall.  A follow-up PET scan on October 31, 2017 was negative for any evidence of metastatic disease.  Follow-up PET scan in August 2019 was also negative.  Review of Systems:10 point review of systems is negative as noted above.   Vitals: There were no vitals taken for this visit.  Physical Exam: General : The patient is a healthy woman in no acute distress.  HEENT: normocephalic, extraoccular movements normal; neck is supple without thyromegally  Lynphnodes: Supraclavicular and inguinal nodes not enlarged   Abdomen: Soft, non-tender, no ascites, no organomegally, no masses, no hernias  Pelvic:  EGBUS: Normal female  Vagina: Normal, no lesions no evidence of bleeding. Urethra and Bladder: Normal, non-tender  Cervix: Surgically absent  Uterus: Surgically absent   bi-manual examination: No masses or nodularity..  Rectal: normal sphincter tone, no masses, no blood  Lower extremities: No edema or varicosities. Normal range of motion      Allergies  Allergen Reactions  . Atorvastatin Other (See Comments)    Leg pain  . Macrodantin Other (See Comments)    Unknown Reaction  . Decongest-Aid [Pseudoephedrine] Palpitations    Past Medical History:  Diagnosis Date  . Anxiety and depression    . DJD (degenerative joint disease)   . Elevated cholesterol   . Endometrial polyp   . History of radiation therapy 10/25/16-12/01/16   right pelvis/60.2 Gy in 28 fractions  . IRRITABLE BOWEL SYNDROME, HX OF 05/15/2008  . MVP (mitral valve prolapse)    occ palpitations   . Ovarian ca ([place]) 07-2012      . Rheumatoid arthritis(714.0) ~ 2010   dr Ouida Sills  . Shingles     Past Surgical History:  Procedure Laterality Date  . ABDOMINAL HYSTERECTOMY  08/22/2012   Procedure: HYSTERECTOMY ABDOMINAL;  Surgeon: Alvino Chapel, MD;  Location: [facility];  Service: Gynecology;  Laterality: N/A;  . BACK SURGERY  2008   Dr Trenton Gammon  . COLONOSCOPY  04-20-2004  . COLONOSCOPY W/ BIOPSIES  07/2014  . COLOSTOMY REVISION  08/22/2012   Procedure: COLON RESECTION SIGMOID;  Surgeon: Alvino Chapel, MD;  Location: [facility];  Service: Gynecology;;  Rectal Sigmoid resection and low rectal anastomosis  . CYST ON NECK  2011  . DEBULKING  08/22/2012   Procedure: DEBULKING;  Surgeon: Alvino Chapel, MD;  Location: [facility];  Service: Gynecology;  Laterality: N/A;  Radical tumor debulking, Bilateral Ureterolysis  . DILATION AND CURETTAGE OF UTERUS  2004   WITH HYSTEROSCOPY  . HAND SURGERY  1996  . HYSTEROSCOPY  2004   D&C  . NECK SURGERY  2009   SPURS  . OMENTECTOMY  08/22/2012   Procedure: OMENTECTOMY;  Surgeon: Alvino Chapel, MD;  Location: [facility];  Service: Gynecology;  Laterality: N/A;  . SALPINGOOPHORECTOMY  08/22/2012   Procedure: SALPINGO OOPHORECTOMY;  Surgeon: Alvino Chapel, MD;  Location: [facility];  Service: Gynecology;  Laterality: Bilateral;  . TUBAL LIGATION  1980    Current Outpatient Medications  Medication Sig Dispense Refill  .  bismuth subsalicylate (PEPTO-BISMOL) 262 MG/15ML suspension Take 30 mLs by mouth 4 (four) times daily -  before meals and at bedtime. 360 mL 0  . docusate sodium (COLACE) 100 MG capsule Take 100 mg by mouth daily as needed.     . estazolam (PROSOM) 2 MG tablet TAKE 1/2 TO 1 (ONE-HALF TO ONE) TABLET BY MOUTH AT BEDTIME 30 tablet 0  . fish oil-omega-3 fatty acids 1000 MG capsule Take 1 g by mouth daily.      . Multiple Vitamin (MULTIVITAMIN WITH MINERALS) TABS Take 1 tablet by mouth daily.    . ondansetron (ZOFRAN) 4 MG tablet Take 1 tablet (4 mg total) by mouth every 8 (eight) hours as needed for nausea or vomiting. 20 tablet 0  . polyethylene glycol (MIRALAX / GLYCOLAX) packet Take 17 g by mouth daily.    . pravastatin (PRAVACHOL) 40 MG tablet  Take 0.5 tablets (20 mg total) by mouth daily. 45 tablet 0  . sertraline (ZOLOFT) 100 MG tablet Take 1 tablet (100 mg total) by mouth daily. 90 tablet 3   No current facility-administered medications for this visit.     Social History   Socioeconomic History  . Marital status: Married    Spouse name: Not on file  . Number of children: 2  . Years of education: Not on file  . Highest education level: Not on file  Occupational History  . Occupation: disability d/t RA  Social Needs  . Financial resource strain: Not on file  . Food insecurity:    Worry: Not on file    Inability: Not on file  . Transportation needs:    Medical: Not on file    Non-medical: Not on file  Tobacco Use  . Smoking status: Never Smoker  . Smokeless tobacco: Never Used  Substance and Sexual Activity  . Alcohol use: No  . Drug use: No  . Sexual activity: Never    Birth control/protection: Post-menopausal, Surgical  Lifestyle  . Physical activity:    Days per week: Not on file    Minutes per session: Not on file  . Stress: Not on file  Relationships  . Social connections:    Talks on phone: Not on file    Gets together: Not on file    Attends religious service: Not on file    Active member of club or organization: Not on file    Attends meetings of clubs or organizations: Not on file    Relationship status: Not on file  . Intimate partner violence:    Fear of current or ex partner: Not on file    Emotionally abused: Not on file    Physically abused: Not on file    Forced sexual activity: Not on file  Other Topics Concern  . Not on file  Social History Narrative   [name] is her daughter    Household-- pr and husband    Family History  Problem Relation Age of Onset  . Heart disease Mother   . Bladder Cancer Father 28  . Lymphoma Paternal Aunt   . Osteoporosis Sister   . Colon cancer Neg Hx   . Rectal cancer Neg Hx   . Stomach cancer Neg Hx   . Diabetes Neg Hx   . Stroke Neg  Hx       Marti Sleigh, MD 11/14/2018, 9:32 AM

## 2018-11-14 NOTE — Progress Notes (Signed)
CA 125 in six months

## 2018-12-06 ENCOUNTER — Other Ambulatory Visit: Payer: Self-pay | Admitting: Internal Medicine

## 2018-12-06 DIAGNOSIS — Z1231 Encounter for screening mammogram for malignant neoplasm of breast: Secondary | ICD-10-CM

## 2019-01-08 ENCOUNTER — Ambulatory Visit: Payer: Medicare Other

## 2019-03-15 ENCOUNTER — Telehealth: Payer: Self-pay | Admitting: *Deleted

## 2019-03-15 NOTE — Telephone Encounter (Signed)
Called and left the patient a message to call the office back. Need to schedule a follow up appt with a lab appt

## 2019-03-16 ENCOUNTER — Telehealth: Payer: Self-pay | Admitting: *Deleted

## 2019-03-16 NOTE — Telephone Encounter (Signed)
Returned the patient's call and scheduled her for appts in August

## 2019-03-20 ENCOUNTER — Other Ambulatory Visit: Payer: Self-pay

## 2019-03-20 ENCOUNTER — Ambulatory Visit (INDEPENDENT_AMBULATORY_CARE_PROVIDER_SITE_OTHER): Payer: Medicare Other | Admitting: Internal Medicine

## 2019-03-20 ENCOUNTER — Encounter: Payer: Self-pay | Admitting: Internal Medicine

## 2019-03-20 VITALS — BP 118/70 | HR 60 | Temp 97.8°F | Wt 137.0 lb

## 2019-03-20 DIAGNOSIS — H6123 Impacted cerumen, bilateral: Secondary | ICD-10-CM | POA: Diagnosis not present

## 2019-03-20 NOTE — Progress Notes (Signed)
Subjective:    Patient ID: [name] [name], female    DOB: 1951-07-30, 68 y.o.   MRN: [id number]  HPI  Patient presents today for complaints of left ear pain and intermittent dizziness that started approximately 2 weeks ago.  She reports a history of cerumen impactions in the past and was advised to use ear drops and irrigate with bulb syringe at home but admits she has not done it like she should.  She reports she sometimes feels unsteady on her feet.  She denies any syncopal episodes.  Review of Systems      Past Medical History:  Diagnosis Date  . Anxiety and depression    . DJD (degenerative joint disease)   . Elevated cholesterol   . Endometrial polyp   . History of radiation therapy 10/25/16-12/01/16   right pelvis/60.2 Gy in 28 fractions  . IRRITABLE BOWEL SYNDROME, HX OF 05/15/2008  . MVP (mitral valve prolapse)    occ palpitations   . Ovarian ca ([place]) 07-2012      . Rheumatoid arthritis(714.0) ~ 2010   dr Ouida Sills  . Shingles     Current Outpatient Medications  Medication Sig Dispense Refill  . estazolam (PROSOM) 2 MG tablet TAKE 1/2 TO 1 (ONE-HALF TO ONE) TABLET BY MOUTH AT BEDTIME 30 tablet 0  . fish oil-omega-3 fatty acids 1000 MG capsule Take 1 g by mouth daily.      . Multiple Vitamin (MULTIVITAMIN WITH MINERALS) TABS Take 1 tablet by mouth daily.    . pravastatin (PRAVACHOL) 40 MG tablet Take 0.5 tablets (20 mg total) by mouth daily. (Patient not taking: Reported on 03/20/2019) 45 tablet 0   No current facility-administered medications for this visit.     Allergies  Allergen Reactions  . Atorvastatin Other (See Comments)    Leg pain  . Macrodantin Other (See Comments)    Unknown Reaction  . Decongest-Aid [Pseudoephedrine] Palpitations    Family History  Problem Relation Age of Onset  . Heart disease Mother   . Bladder Cancer Father 40  . Lymphoma Paternal Aunt   . Osteoporosis Sister   . Colon cancer Neg Hx   . Rectal cancer Neg Hx   . Stomach  cancer Neg Hx   . Diabetes Neg Hx   . Stroke Neg Hx     Social History   Socioeconomic History  . Marital status: Married    Spouse name: Not on file  . Number of children: 2  . Years of education: Not on file  . Highest education level: Not on file  Occupational History  . Occupation: disability d/t RA  Social Needs  . Financial resource strain: Not on file  . Food insecurity    Worry: Not on file    Inability: Not on file  . Transportation needs    Medical: Not on file    Non-medical: Not on file  Tobacco Use  . Smoking status: Never Smoker  . Smokeless tobacco: Never Used  Substance and Sexual Activity  . Alcohol use: No  . Drug use: No  . Sexual activity: Never    Birth control/protection: Post-menopausal, Surgical  Lifestyle  . Physical activity    Days per week: Not on file    Minutes per session: Not on file  . Stress: Not on file  Relationships  . Social Herbalist on phone: Not on file    Gets together: Not on file    Attends religious service: Not on  file    Active member of club or organization: Not on file    Attends meetings of clubs or organizations: Not on file    Relationship status: Not on file  . Intimate partner violence    Fear of current or ex partner: Not on file    Emotionally abused: Not on file    Physically abused: Not on file    Forced sexual activity: Not on file  Other Topics Concern  . Not on file  Social History Narrative   [name] is her daughter    Household-- pr and husband     Constitutional: Denies fever, malaise, fatigue, headache or abrupt weight changes.  HEENT: Positive for left ear pain with history of wax build up. Denies eye pain, eye redness, ringing in the ears,  runny nose, nasal congestion, bloody nose, or sore throat. Respiratory: Denies difficulty breathing, shortness of breath, cough or sputum production.   Cardiovascular: Denies chest pain, chest tightness, palpitations or swelling in  the hands or feet.  Skin: Denies redness, rashes, lesions or ulcercations.  Neurological: Positive for intermittent dizziness when changing positions and turning head which makes her feel unsteady on her feet. Denies difficulty with memory, difficulty with speech or problems.   No other specific complaints in a complete review of systems (except as listed in HPI above).  Objective:   Physical Exam  BP 118/70   Pulse 60   Temp 97.8 F (36.6 C) (Oral)   Wt 137 lb (62.1 kg)   SpO2 98%   BMI 23.52 kg/m  Wt Readings from Last 3 Encounters:  03/20/19 137 lb (62.1 kg)  11/14/18 138 lb 1 oz (62.6 kg)  09/29/18 130 lb 9.6 oz (59.2 kg)    General: Appears her stated age, well developed, well nourished in NAD. Skin: Warm, dry and intact. No rashes, lesions or ulcerations noted. HEENT: Head: normal shape and size; Eyes: sclera white, no icterus, conjunctiva pink, PERRLA and EOMs intact; Ears: Bilateral Tm's unable to be visualized due to cerumen impaction.; Post irrigation canal red Cardiovascular: Normal rate and rhythm. S1,S2 noted.  No murmur, rubs or gallops noted.  Pulmonary/Chest: Normal effort and positive vesicular breath sounds. No respiratory distress. No wheezes, rales or ronchi noted.  Musculoskeletal: No difficulty with gait.  Neurological: Alert and oriented. Cranial nerves II-XII grossly intact. Coordination normal.       BMET    Component Value Date/Time   NA 141 04/25/2018 1018   NA 142 09/20/2017 0940   K 4.1 04/25/2018 1018   K 4.7 09/20/2017 0940   CL 104 04/25/2018 1018   CL 104 01/02/2013 0821   CO2 32 04/25/2018 1018   CO2 28 09/20/2017 0940   GLUCOSE 91 04/25/2018 1018   GLUCOSE 91 09/20/2017 0940   GLUCOSE 86 01/02/2013 0821   BUN 22 04/25/2018 1018   BUN 14.2 09/20/2017 0940   CREATININE 0.83 04/25/2018 1018   CREATININE 0.9 09/20/2017 0940   CALCIUM 9.0 04/25/2018 1018   CALCIUM 9.8 09/20/2017 0940   GFRNONAA >90 08/23/2012 0340   GFRAA >90  08/23/2012 0340    Lipid Panel     Component Value Date/Time   CHOL 241 (H) 04/25/2018 1018   TRIG 135.0 04/25/2018 1018   HDL 57.10 04/25/2018 1018   CHOLHDL 4 04/25/2018 1018   VLDL 27.0 04/25/2018 1018   LDLCALC 157 (H) 04/25/2018 1018    CBC    Component Value Date/Time   WBC 4.7 04/25/2018 1018  RBC 4.33 04/25/2018 1018   HGB 14.1 04/25/2018 1018   HGB 12.8 03/26/2013 0842   HCT 43.0 04/25/2018 1018   HCT 37.4 03/26/2013 0842   PLT 242.0 04/25/2018 1018   PLT 194 03/26/2013 0842   MCV 99.3 04/25/2018 1018   MCV 111.1 (H) 03/26/2013 0842   MCH 32.7 09/07/2016 0716   MCHC 32.9 04/25/2018 1018   RDW 13.7 04/25/2018 1018   RDW 14.5 03/26/2013 0842   LYMPHSABS 1.5 10/18/2014 1024   LYMPHSABS 1.0 03/26/2013 0842   MONOABS 0.6 10/18/2014 1024   MONOABS 0.5 03/26/2013 0842   EOSABS 0.1 10/18/2014 1024   EOSABS 0.1 03/26/2013 0842   BASOSABS 0.0 10/18/2014 1024   BASOSABS 0.0 03/26/2013 0842    Hgb A1C No results found for: HGBA1C          Assessment & Plan:   Hearing Loss secondary to Cerumen Impaction of Bilateral Ears:  Bilateral ears irrigated today, patient tolerated well.   Encourage use of OTC Debrox ear drops twice weekly to prevent wax build up. Recommend monitoring symptoms of dizziness for the next few days as cerumen impaction can cause these symptoms.   Advised to return if dizziness does not improve.    Has an appointment scheduled for annual wellness exam in 1 month.  Will follow up then as well. Webb Silversmith, NP

## 2019-03-20 NOTE — Patient Instructions (Signed)
Earwax Buildup, Adult  The ears produce a substance called earwax that helps keep bacteria out of the ear and protects the skin in the ear canal. Occasionally, earwax can build up in the ear and cause discomfort or hearing loss.  What increases the risk?  This condition is more likely to develop in people who:  · Are female.  · Are elderly.  · Naturally produce more earwax.  · Clean their ears often with cotton swabs.  · Use earplugs often.  · Use in-ear headphones often.  · Wear hearing aids.  · Have narrow ear canals.  · Have earwax that is overly thick or sticky.  · Have eczema.  · Are dehydrated.  · Have excess hair in the ear canal.  What are the signs or symptoms?  Symptoms of this condition include:  · Reduced or muffled hearing.  · A feeling of fullness in the ear or feeling that the ear is plugged.  · Fluid coming from the ear.  · Ear pain.  · Ear itch.  · Ringing in the ear.  · Coughing.  · An obvious piece of earwax that can be seen inside the ear canal.  How is this diagnosed?  This condition may be diagnosed based on:  · Your symptoms.  · Your medical history.  · An ear exam. During the exam, your health care provider will look into your ear with an instrument called an otoscope.  You may have tests, including a hearing test.  How is this treated?  This condition may be treated by:  · Using ear drops to soften the earwax.  · Having the earwax removed by a health care provider. The health care provider may:  ? Flush the ear with water.  ? Use an instrument that has a loop on the end (curette).  ? Use a suction device.  · Surgery to remove the wax buildup. This may be done in severe cases.  Follow these instructions at home:    · Take over-the-counter and prescription medicines only as told by your health care provider.  · Do not put any objects, including cotton swabs, into your ear. You can clean the opening of your ear canal with a washcloth or facial tissue.  · Follow instructions from your health care  provider about cleaning your ears. Do not over-clean your ears.  · Drink enough fluid to keep your urine clear or pale yellow. This will help to thin the earwax.  · Keep all follow-up visits as told by your health care provider. If earwax builds up in your ears often or if you use hearing aids, consider seeing your health care provider for routine, preventive ear cleanings. Ask your health care provider how often you should schedule your cleanings.  · If you have hearing aids, clean them according to instructions from the manufacturer and your health care provider.  Contact a health care provider if:  · You have ear pain.  · You develop a fever.  · You have blood, pus, or other fluid coming from your ear.  · You have hearing loss.  · You have ringing in your ears that does not go away.  · Your symptoms do not improve with treatment.  · You feel like the room is spinning (vertigo).  Summary  · Earwax can build up in the ear and cause discomfort or hearing loss.  · The most common symptoms of this condition include reduced or muffled hearing and a feeling of   fullness in the ear or feeling that the ear is plugged.  · This condition may be diagnosed based on your symptoms, your medical history, and an ear exam.  · This condition may be treated by using ear drops to soften the earwax or by having the earwax removed by a health care provider.  · Do not put any objects, including cotton swabs, into your ear. You can clean the opening of your ear canal with a washcloth or facial tissue.  This information is not intended to replace advice given to you by your health care provider. Make sure you discuss any questions you have with your health care provider.  Document Released: 10/28/2004 Document Revised: 09/01/2017 Document Reviewed: 12/01/2016  Elsevier Interactive Patient Education © 2019 [employer]

## 2019-03-22 DIAGNOSIS — Z20828 Contact with and (suspected) exposure to other viral communicable diseases: Secondary | ICD-10-CM | POA: Diagnosis not present

## 2019-03-23 ENCOUNTER — Other Ambulatory Visit: Payer: Self-pay

## 2019-03-23 ENCOUNTER — Ambulatory Visit
Admission: RE | Admit: 2019-03-23 | Discharge: 2019-03-23 | Disposition: A | Discharge status: Home / Self Care | Payer: Medicare Other | Source: Ambulatory Visit | Attending: Internal Medicine | Admitting: Internal Medicine

## 2019-03-23 DIAGNOSIS — Z1231 Encounter for screening mammogram for malignant neoplasm of breast: Secondary | ICD-10-CM

## 2019-04-30 ENCOUNTER — Encounter: Payer: Self-pay | Admitting: Internal Medicine

## 2019-04-30 ENCOUNTER — Ambulatory Visit (INDEPENDENT_AMBULATORY_CARE_PROVIDER_SITE_OTHER): Payer: Medicare Other | Admitting: Internal Medicine

## 2019-04-30 ENCOUNTER — Other Ambulatory Visit: Payer: Self-pay

## 2019-04-30 VITALS — BP 110/72 | HR 77 | Temp 96.8°F | Ht 64.0 in | Wt 138.8 lb

## 2019-04-30 DIAGNOSIS — F329 Major depressive disorder, single episode, unspecified: Secondary | ICD-10-CM

## 2019-04-30 DIAGNOSIS — M8580 Other specified disorders of bone density and structure, unspecified site: Secondary | ICD-10-CM

## 2019-04-30 DIAGNOSIS — K581 Irritable bowel syndrome with constipation: Secondary | ICD-10-CM

## 2019-04-30 DIAGNOSIS — E7800 Pure hypercholesterolemia, unspecified: Secondary | ICD-10-CM

## 2019-04-30 DIAGNOSIS — K589 Irritable bowel syndrome without diarrhea: Secondary | ICD-10-CM | POA: Insufficient documentation

## 2019-04-30 DIAGNOSIS — M069 Rheumatoid arthritis, unspecified: Secondary | ICD-10-CM

## 2019-04-30 DIAGNOSIS — F419 Anxiety disorder, unspecified: Secondary | ICD-10-CM

## 2019-04-30 DIAGNOSIS — C569 Malignant neoplasm of unspecified ovary: Secondary | ICD-10-CM | POA: Diagnosis not present

## 2019-04-30 DIAGNOSIS — F32A Depression, unspecified: Secondary | ICD-10-CM

## 2019-04-30 DIAGNOSIS — Z0000 Encounter for general adult medical examination without abnormal findings: Secondary | ICD-10-CM | POA: Diagnosis not present

## 2019-04-30 LAB — CBC
HCT: 42.9 % (ref 36.0–46.0)
Hemoglobin: 14.2 g/dL (ref 12.0–15.0)
MCHC: 33.1 g/dL (ref 30.0–36.0)
MCV: 100.5 fl — ABNORMAL HIGH (ref 78.0–100.0)
Platelets: 237 10*3/uL (ref 150.0–400.0)
RBC: 4.27 Mil/uL (ref 3.87–5.11)
RDW: 13.5 % (ref 11.5–15.5)
WBC: 5.5 10*3/uL (ref 4.0–10.5)

## 2019-04-30 LAB — COMPREHENSIVE METABOLIC PANEL
ALT: 21 U/L (ref 0–35)
AST: 21 U/L (ref 0–37)
Albumin: 4.3 g/dL (ref 3.5–5.2)
Alkaline Phosphatase: 54 U/L (ref 39–117)
BUN: 23 mg/dL (ref 6–23)
CO2: 28 mEq/L (ref 19–32)
Calcium: 9.5 mg/dL (ref 8.4–10.5)
Chloride: 104 mEq/L (ref 96–112)
Creatinine, Ser: 0.75 mg/dL (ref 0.40–1.20)
GFR: 76.52 mL/min (ref >60.00)
Glucose, Bld: 88 mg/dL (ref 70–99)
Potassium: 4 mEq/L (ref 3.5–5.1)
Sodium: 140 mEq/L (ref 135–145)
Total Bilirubin: 0.8 mg/dL (ref 0.2–1.2)
Total Protein: 6.9 g/dL (ref 6.0–8.3)

## 2019-04-30 LAB — LIPID PANEL
Cholesterol: 265 mg/dL — ABNORMAL HIGH (ref 0–200)
HDL: 56.6 mg/dL (ref >39.00)
LDL Cholesterol: 182 mg/dL — ABNORMAL HIGH (ref 0–99)
NonHDL: 208.24
Total CHOL/HDL Ratio: 5
Triglycerides: 133 mg/dL (ref 0.0–149.0)
VLDL: 26.6 mg/dL (ref 0.0–40.0)

## 2019-04-30 LAB — VITAMIN D 25 HYDROXY (VIT D DEFICIENCY, FRACTURES): VITD: 29.13 ng/mL — ABNORMAL LOW (ref 30.00–100.00)

## 2019-04-30 NOTE — Assessment & Plan Note (Signed)
Currently taking daily multivitamin. Bone density ordered. Vitamin D checked today.

## 2019-04-30 NOTE — Assessment & Plan Note (Signed)
Not currently seeing Rheumatology. She takes herbal/natural supplements with good relief. Will monitor.

## 2019-04-30 NOTE — Patient Instructions (Addendum)
There are no preventive care reminders to display for this patient.  Depression screen [facility] 2/9 04/30/2019 04/25/2018 06/02/2017  Decreased Interest 0 0 1  Down, Depressed, Hopeless 0 0 1  PHQ - 2 Score 0 0 2  Altered sleeping 1 - 3  Tired, decreased energy 1 - 3  Change in appetite 3 - 0  Feeling bad or failure about yourself  0 - 0  Trouble concentrating 3 - 3  Moving slowly or fidgety/restless 3 - 0  Suicidal thoughts 0 - 0  PHQ-9 Score 11 - 11  Difficult doing work/chores Not difficult at all - Somewhat difficult  Some recent data might be hidden   Health Maintenance After Age 74 After age 30, you are at a higher risk for certain long-term diseases and infections as well as injuries from falls. Falls are a major cause of broken bones and head injuries in people who are older than age 55. Getting regular preventive care can help to keep you healthy and well. Preventive care includes getting regular testing and making lifestyle changes as recommended by your health care provider. Talk with your health care provider about:  Which screenings and tests you should have. A screening is a test that checks for a disease when you have no symptoms.  A diet and exercise plan that is right for you. What should I know about screenings and tests to prevent falls? Screening and testing are the best ways to find a health problem early. Early diagnosis and treatment give you the best chance of managing medical conditions that are common after age 26. Certain conditions and lifestyle choices may make you more likely to have a fall. Your health care provider may recommend:  Regular vision checks. Poor vision and conditions such as cataracts can make you more likely to have a fall. If you wear glasses, make sure to get your prescription updated if your vision changes.  Medicine review. Work with your health care provider to regularly review all of the medicines you are taking, including over-the-counter  medicines. Ask your health care provider about any side effects that may make you more likely to have a fall. Tell your health care provider if any medicines that you take make you feel dizzy or sleepy.  Osteoporosis screening. Osteoporosis is a condition that causes the bones to get weaker. This can make the bones weak and cause them to break more easily.  Blood pressure screening. Blood pressure changes and medicines to control blood pressure can make you feel dizzy.  Strength and balance checks. Your health care provider may recommend certain tests to check your strength and balance while standing, walking, or changing positions.  Foot health exam. Foot pain and numbness, as well as not wearing proper footwear, can make you more likely to have a fall.  Depression screening. You may be more likely to have a fall if you have a fear of falling, feel emotionally low, or feel unable to do activities that you used to do.  Alcohol use screening. Using too much alcohol can affect your balance and may make you more likely to have a fall. What actions can I take to lower my risk of falls? General instructions  Talk with your health care provider about your risks for falling. Tell your health care provider if: ? You fall. Be sure to tell your health care provider about all falls, even ones that seem minor. ? You feel dizzy, sleepy, or off-balance.  Take over-the-counter and prescription  medicines only as told by your health care provider. These include any supplements.  Eat a healthy diet and maintain a healthy weight. A healthy diet includes low-fat dairy products, low-fat (lean) meats, and fiber from whole grains, beans, and lots of fruits and vegetables. Home safety  Remove any tripping hazards, such as rugs, cords, and clutter.  Install safety equipment such as grab bars in bathrooms and safety rails on stairs.  Keep rooms and walkways well-lit. Activity   Follow a regular exercise  program to stay fit. This will help you maintain your balance. Ask your health care provider what types of exercise are appropriate for you.  If you need a cane or walker, use it as recommended by your health care provider.  Wear supportive shoes that have nonskid soles. Lifestyle  Do not drink alcohol if your health care provider tells you not to drink.  If you drink alcohol, limit how much you have: ? 0-1 drink a day for women. ? 0-2 drinks a day for men.  Be aware of how much alcohol is in your drink. In the [place] one drink equals one typical bottle of beer (12 oz), one-half glass of wine (5 oz), or one shot of hard liquor (1 oz).  Do not use any products that contain nicotine or tobacco, such as cigarettes and e-cigarettes. If you need help quitting, ask your health care provider. Summary  Having a healthy lifestyle and getting preventive care can help to protect your health and wellness after age 21.  Screening and testing are the best way to find a health problem early and help you avoid having a fall. Early diagnosis and treatment give you the best chance for managing medical conditions that are more common for people who are older than age 58.  Falls are a major cause of broken bones and head injuries in people who are older than age 43. Take precautions to prevent a fall at home.  Work with your health care provider to learn what changes you can make to improve your health and wellness and to prevent falls. This information is not intended to replace advice given to you by your health care provider. Make sure you discuss any questions you have with your health care provider. Document Released: 08/03/2017 Document Revised: 01/11/2019 Document Reviewed: 08/03/2017 Elsevier Patient Education  2020 [employer].

## 2019-04-30 NOTE — Assessment & Plan Note (Addendum)
PHQ9 11, but she feels this is controlled off medications. Support offered.

## 2019-04-30 NOTE — Assessment & Plan Note (Signed)
Not taking Pravastatin as recommended.  She is taking the Fish Oil as recommended. Lipids, CMP checked today. Will follow up after labs result.

## 2019-04-30 NOTE — Assessment & Plan Note (Signed)
Currently in remission. Follows with oncology.

## 2019-04-30 NOTE — Progress Notes (Signed)
HPI:  Patient presents to the clinic today for her subsequent Medicare Wellness Examination.  She is also due for follow up of chronic conditions.  Anxiety/Depression:  She is not taking Sertraline as prescribed, because she feels like she did not need it anymore.  She takes Prosom for sleep every 3 or so days.  She is not following with psych.  She denies SI/HI.  HLD:  Her last LDL was 157, 04/2018.  She is not taking Pravastatin but is taking Fish Oil as prescribed.  She does report some myalgias on the statin.  She tries to consume a low fat diet.   Hx of Ovarian Cancer:  S/p hysterectomy, chemo and radiation. Currently in remission.  She follows with oncology.  IBS:  Mostly constipation.  She does not currently take any medication for this. Diet controlled with high fiber diets, and fresh fruit.  RA:  She follows with Dr. Ouida Sills.  She does not take any medication for this, she just deals with the pain.  Osteopenia: Her last bone density was 03/2016. She occasionally takes Calcium and Vit D OTC. She gets weight bearing exercise daily.   Past Medical History:  Diagnosis Date  . Anxiety and depression    . DJD (degenerative joint disease)   . Elevated cholesterol   . Endometrial polyp   . History of radiation therapy 10/25/16-12/01/16   right pelvis/60.2 Gy in 28 fractions  . IRRITABLE BOWEL SYNDROME, HX OF 05/15/2008  . MVP (mitral valve prolapse)    occ palpitations   . Ovarian ca ([place]) 07-2012      . Rheumatoid arthritis(714.0) ~ 2010   dr Ouida Sills  . Shingles     Current Outpatient Medications  Medication Sig Dispense Refill  . estazolam (PROSOM) 2 MG tablet TAKE 1/2 TO 1 (ONE-HALF TO ONE) TABLET BY MOUTH AT BEDTIME 30 tablet 0  . fish oil-omega-3 fatty acids 1000 MG capsule Take 1 g by mouth daily.      . Multiple Vitamin (MULTIVITAMIN WITH MINERALS) TABS Take 1 tablet by mouth daily.    . pravastatin (PRAVACHOL) 40 MG tablet Take 0.5 tablets (20 mg total) by mouth daily.  (Patient not taking: Reported on 03/20/2019) 45 tablet 0   No current facility-administered medications for this visit.     Allergies  Allergen Reactions  . Atorvastatin Other (See Comments)    Leg pain  . Macrodantin Other (See Comments)    Unknown Reaction  . Decongest-Aid [Pseudoephedrine] Palpitations    Family History  Problem Relation Age of Onset  . Heart disease Mother   . Bladder Cancer Father 75  . Lymphoma Paternal Aunt   . Osteoporosis Sister   . Colon cancer Neg Hx   . Rectal cancer Neg Hx   . Stomach cancer Neg Hx   . Diabetes Neg Hx   . Stroke Neg Hx     Social History   Socioeconomic History  . Marital status: Married    Spouse name: Not on file  . Number of children: 2  . Years of education: Not on file  . Highest education level: Not on file  Occupational History  . Occupation: disability d/t RA  Social Needs  . Financial resource strain: Not on file  . Food insecurity    Worry: Not on file    Inability: Not on file  . Transportation needs    Medical: Not on file    Non-medical: Not on file  Tobacco Use  . Smoking status: Never  Smoker  . Smokeless tobacco: Never Used  Substance and Sexual Activity  . Alcohol use: No  . Drug use: No  . Sexual activity: Never    Birth control/protection: Post-menopausal, Surgical  Lifestyle  . Physical activity    Days per week: Not on file    Minutes per session: Not on file  . Stress: Not on file  Relationships  . Social Herbalist on phone: Not on file    Gets together: Not on file    Attends religious service: Not on file    Active member of club or organization: Not on file    Attends meetings of clubs or organizations: Not on file    Relationship status: Not on file  . Intimate partner violence    Fear of current or ex partner: Not on file    Emotionally abused: Not on file    Physically abused: Not on file    Forced sexual activity: Not on file  Other Topics Concern  . Not on file   Social History Narrative   [name] is her daughter    Household-- pr and husband    Hospitiliaztions: None  Health Maintenance:    Flu: 06/2016  Tetanus: 10/2014  Pneumovax: 10/2014  Prevnar: 03/2016  Zostavax: never  Shingrix: never  Mammogram: 03/2019  Pap Smear: Total Hysterectomy  Bone Density: 03/2016  Colon Screening:07/2014  Eye Doctor: [place]- annually  Dental Exam: as needed   Providers:   PCP: Webb Silversmith, NP  Oncologist:  Dr. Kerry Dory  Rheumatologist: Dr. Ouida Sills   I have personally reviewed and have noted:  1. The patient's medical and social history 2. Their use of alcohol, tobacco or illicit drugs 3. Their current medications and supplements 4. The patient's functional ability including ADL's, fall risks, home safety risks and hearing or visual impairment. 5. Diet and physical activities 6. Evidence for depression or mood disorder  Subjective:   Review of Systems:   Constitutional: Denies fever, malaise, fatigue, headache or abrupt weight changes.  HEENT: Denies eye pain, eye redness, ear pain, ringing in the ears, wax buildup, runny nose, nasal congestion, bloody nose, or sore throat. Respiratory: Denies difficulty breathing, shortness of breath, cough or sputum production.   Cardiovascular: Denies chest pain, chest tightness, palpitations or swelling in the hands or feet.  Gastrointestinal: Pt reports intermittent constipation. Denies abdominal pain, bloating, diarrhea or blood in the stool.  GU: Denies urgency, frequency, pain with urination, burning sensation, blood in urine, odor or discharge. Musculoskeletal: Pt reports joint pain in hands. Denies decrease in range of motion, difficulty with gait, muscle pain or joint  swelling.  Skin: Denies redness, rashes, lesions or ulcercations.  Neurological: Denies dizziness, difficulty with memory, difficulty with speech or problems with balance and coordination.  Psych: Denies anxiety,  depression, SI/HI.  No other specific complaints in a complete review of systems (except as listed in HPI above).  Objective:  PE:   BP 92/60   Pulse 72   Temp (!) 96.8 F (36 C) (Oral)   Ht 5\' 4"  (1.626 m)   Wt 138 lb 12.8 oz (63 kg)   SpO2 97%   BMI 23.82 kg/m   Wt Readings from Last 3 Encounters:  03/20/19 137 lb (62.1 kg)  11/14/18 138 lb 1 oz (62.6 kg)  09/29/18 130 lb 9.6 oz (59.2 kg)    General: Appears her stated age, well developed, well nourished in NAD. Skin: Fungal infection of bilateral  thumb nails and left great toenail.  Warm, dry and intact. No rashes noted. HEENT: Head: normal shape and size; Eyes: sclera white, no icterus, conjunctiva pink, PERRLA and EOMs intact; Ears: Tm's gray and intact, normal light reflex;  Neck: Neck supple, trachea midline. No masses, lumps or thyromegaly present.  Cardiovascular: Normal rate and rhythm. S1,S2 noted.  No murmur, rubs or gallops noted. No JVD or BLE edema. No carotid bruits noted. Pulmonary/Chest: Normal effort and positive vesicular breath sounds. No respiratory distress. No wheezes, rales or ronchi noted.  Abdomen: Soft and nontender. Normal bowel sounds. No distention or masses noted. Liver, spleen and kidneys non palpable. Musculoskeletal: Rheumatoid nodules noted of fingers bilaterally. Strength 5/5 BUE/BLE. No difficulty with gait. Neurological: Alert and oriented. Cranial nerves II-XII grossly intact. Coordination normal.  Psychiatric: Mood and affect normal. Behavior is normal. Judgment and thought content normal.    BMET    Component Value Date/Time   NA 141 04/25/2018 1018   NA 142 09/20/2017 0940   K 4.1 04/25/2018 1018   K 4.7 09/20/2017 0940   CL 104 04/25/2018 1018   CL 104 01/02/2013 0821   CO2 32 04/25/2018 1018   CO2 28 09/20/2017 0940   GLUCOSE 91 04/25/2018 1018   GLUCOSE 91 09/20/2017 0940   GLUCOSE 86 01/02/2013 0821   BUN 22 04/25/2018 1018   BUN 14.2 09/20/2017 0940   CREATININE 0.83  04/25/2018 1018   CREATININE 0.9 09/20/2017 0940   CALCIUM 9.0 04/25/2018 1018   CALCIUM 9.8 09/20/2017 0940   GFRNONAA >90 08/23/2012 0340   GFRAA >90 08/23/2012 0340    Lipid Panel     Component Value Date/Time   CHOL 241 (H) 04/25/2018 1018   TRIG 135.0 04/25/2018 1018   HDL 57.10 04/25/2018 1018   CHOLHDL 4 04/25/2018 1018   VLDL 27.0 04/25/2018 1018   LDLCALC 157 (H) 04/25/2018 1018    CBC    Component Value Date/Time   WBC 4.7 04/25/2018 1018   RBC 4.33 04/25/2018 1018   HGB 14.1 04/25/2018 1018   HGB 12.8 03/26/2013 0842   HCT 43.0 04/25/2018 1018   HCT 37.4 03/26/2013 0842   PLT 242.0 04/25/2018 1018   PLT 194 03/26/2013 0842   MCV 99.3 04/25/2018 1018   MCV 111.1 (H) 03/26/2013 0842   MCH 32.7 09/07/2016 0716   MCHC 32.9 04/25/2018 1018   RDW 13.7 04/25/2018 1018   RDW 14.5 03/26/2013 0842   LYMPHSABS 1.5 10/18/2014 1024   LYMPHSABS 1.0 03/26/2013 0842   MONOABS 0.6 10/18/2014 1024   MONOABS 0.5 03/26/2013 0842   EOSABS 0.1 10/18/2014 1024   EOSABS 0.1 03/26/2013 0842   BASOSABS 0.0 10/18/2014 1024   BASOSABS 0.0 03/26/2013 0842    Hgb A1C No results found for: HGBA1C    Assessment and Plan:   Medicare Annual Wellness Visit:  Diet: She does eat meat. She consumes fruits and veggies daily. She does eat some fried food. She drinks mostly water and Gatorade. Physical activity: Yardwork Depression/mood screen: Negative Hearing: Intact to whispered voice Visual acuity: Grossly normal, performs annual eye exam  ADLs: Capable Fall risk: None Home safety: Good Cognitive evaluation: Trouble with recall. Intact to orientation, naming, and repetition EOL planning: Adv directives, full code/ I agree  Preventative Medicine: Encouraged her to get a flu shot in the fall. Tetanus, pneumovax and prevnar UTD. Encouraged her to get a Shingrix at her pharmacy. She no longer needs pap smear. Mammogram UTD. Bone density ordered. Colonoscopy  due 2025. Encouraged  her to consume a balanced diet and exercise regimen. Advised her to see an eye doctor and dentist biannually.  Hypotension:  Orthostatics negative Encouraged her to consume 48 oz preferably 64 oz of water daily   Next appointment: 1 year, Medicare [place], NP

## 2019-04-30 NOTE — Assessment & Plan Note (Signed)
Managed with diet. Will monitor.

## 2019-05-04 MED ORDER — PRAVASTATIN SODIUM 40 MG PO TABS: 20.0000 mg | ORAL_TABLET | Freq: Every day | ORAL | 0 refills | Status: DC

## 2019-05-04 NOTE — Addendum Note (Signed)
Addended by: Lurlean Nanny on: 05/04/2019 12:16 PM   Modules accepted: Orders

## 2019-05-07 ENCOUNTER — Other Ambulatory Visit: Payer: Self-pay

## 2019-05-07 ENCOUNTER — Inpatient Hospital Stay: Payer: Medicare Other | Attending: Gynecology

## 2019-05-07 DIAGNOSIS — Z8543 Personal history of malignant neoplasm of ovary: Secondary | ICD-10-CM | POA: Diagnosis not present

## 2019-05-07 DIAGNOSIS — C569 Malignant neoplasm of unspecified ovary: Secondary | ICD-10-CM

## 2019-05-07 DIAGNOSIS — H5203 Hypermetropia, bilateral: Secondary | ICD-10-CM | POA: Diagnosis not present

## 2019-05-07 DIAGNOSIS — H524 Presbyopia: Secondary | ICD-10-CM | POA: Diagnosis not present

## 2019-05-08 LAB — CA 125: Cancer Antigen (CA) 125: 6.3 U/mL (ref 0.0–38.1)

## 2019-05-09 ENCOUNTER — Inpatient Hospital Stay (HOSPITAL_BASED_OUTPATIENT_CLINIC_OR_DEPARTMENT_OTHER): Payer: Medicare Other | Admitting: Gynecology

## 2019-05-09 ENCOUNTER — Encounter: Payer: Self-pay | Admitting: Gynecology

## 2019-05-09 ENCOUNTER — Telehealth: Payer: Self-pay | Admitting: Gynecology

## 2019-05-09 DIAGNOSIS — C569 Malignant neoplasm of unspecified ovary: Secondary | ICD-10-CM

## 2019-05-09 DIAGNOSIS — Z923 Personal history of irradiation: Secondary | ICD-10-CM

## 2019-05-09 DIAGNOSIS — Z8543 Personal history of malignant neoplasm of ovary: Secondary | ICD-10-CM

## 2019-05-09 NOTE — Patient Instructions (Signed)
Return to see me in 6 months.

## 2019-05-09 NOTE — Telephone Encounter (Signed)
See other note

## 2019-05-09 NOTE — Progress Notes (Signed)
Virtual Visit via Telephone Note  I connected with [name] [name] on 05/09/19 at  9:00 AM EDT by telephone and verified that I am speaking with the correct person using two identifiers.  Location: Patient: home Provider: Gaspar Cola   I discussed the limitations, risks, security and privacy concerns of performing an evaluation and management service by telephone and the availability of in person appointments. I also discussed with the patient that there may be a patient responsible charge related to this service. The patient expressed understanding and agreed to proceed. Assessment: stage IIIc ovarian cancer (optimally debulked). Status post 6 cycles of carboplatin and Taxol given in a dose dense regimen completed May 2014.  Pelvic lymph node recurrence in 2018 treated with radiation therapy completed 12/01/2016.   [name]  Recent Ca1 25 was 6.3 units/mL (stable from prior values).  Clinically NED. Doing very well.   Plan  patient will return seem me in 6 months. Ca1 25 will be obtained prior to that visit.  Interval history: .   Patient returns today as previously scheduled. Since her last visit she's done very well.  In fact she says that over the last 6 months she is felt better than she is ever felt in her life(even more than 6 months agao!) .   She is been very active getting out of the house, planning flowers, and attending yard sales.   .  Her appetite is good she has no GI GU or other pelvic symptoms. She's not having any sequelae from radiation therapy. Overall her functional status is excellent.  Her most recent Ca1 25 was 6.3 units/mL (previously 6.7, 5.9 units/mL).  OIZ:TIWPYKD is a 69 year old gravida 2 para 2 who went through menopause at the age about 68 years of age. She never took any hormone replacement therapy. She states that for the past 6 months she's experienced some pelvic soreness in pressure. She has noted some increasing urinary frequency. With these symptoms she had a PET  scan on July 12, 2012. It revealed significant soft tissue thickening seen throughout the greater omentum suspicious for intraperitoneal carcinoma. There is minimal ascites in the pelvis. it revealed the areas of omental and peritoneal nodularity were hypermetabolic. There is a dominant pelvic omental mass measuring 10.4 x 3.3 cm. There is a small omental implants in the left side of the abdomen measuring 1.1 cm. There were multiple pelvic foci That wereperitoneal based And hypermetabolic. These included the cul-de-sac. She has small volume of ascites. She underwent a CT-guided biopsy on July 24, 2012. It revealed metastatic carcinoma. By immunohistochemistry, malignant cells were positive for WT 1, cytokeratin 7, estrogen receptor. There were negative for TTF-1, progesterone receptor, cytokeratin 20, CEA, CD X2, and S100. Overall, the histologic features were highly suggestive of a primary gynecologic origin. Her CA 125 on July 13, 2012 was elevated at 58.  She underwent exploratory laparotomy on 08/22/2012 findings of stage IIIC ovarian cancer. Patient was optimally debulked. Debulking, however, required a rectosigmoid resection as well as total omentectomy total nominal hysterectomy bilateral salpingo-oophorectomy. She was optimally debulked with only 5 mm nodules on the right diaphragm as residual disease.  SURGICAL FINDINGS: At exploratory laparotomy the infracolic omentum was nearly replaced with tumor. This measured approximately 12 x 16 cm. There were 5 mm nodules on the right diaphragm. The small bowel serosa and mesentery were normal. In the pelvis it appeared that the left ovary was a primary source. It was cystic and densely adherent to the sigmoid mesentery.  The right ovary was approximately 4 cm in diameter and had multiple excrescences on it. There were extensive tumor implants on the bladder flap and involving the sigmoid mesentery and serosa, and posterior cul-de-sac. At the conclusion of  the surgical procedure the only residual disease were 5 mm nodules on the right diaphragm. Chemotherapy was initiated in December 2013 using the dose dense regimen of carboplatin and Taxol   She received 6 cycles of carboplatin and Taxol the last administered on 02/06/2013. At that time her CA 125 is 5.6 units per mL.  Patient was found to have recurrent disease in January 2018. It was confined to the right external iliac lymph node chain and subsequently treated with whole pelvis radiation therapy and a boost to the right pelvic sidewall.  A follow-up PET scan on October 31, 2017 was negative for any evidence of metastatic disease.  Follow-up PET scan in August 2019 was also negative.  Review of Systems:10 point review of systems is negative as noted above.     I discussed the assessment and treatment plan with the patient. The patient was provided an opportunity to ask questions and all were answered. The patient agreed with the plan and demonstrated an understanding of the instructions.   The patient was advised to call back or seek an in-person evaluation if the symptoms worsen or if the condition fails to improve as anticipated.  I provided 10 minutes of non-face-to-face time during this encounter.       [name] Sleigh, MD

## 2019-06-26 DIAGNOSIS — M5417 Radiculopathy, lumbosacral region: Secondary | ICD-10-CM | POA: Diagnosis not present

## 2019-06-26 DIAGNOSIS — M545 Low back pain: Secondary | ICD-10-CM | POA: Diagnosis not present

## 2019-06-28 ENCOUNTER — Other Ambulatory Visit: Payer: Self-pay

## 2019-06-28 DIAGNOSIS — R6889 Other general symptoms and signs: Secondary | ICD-10-CM | POA: Diagnosis not present

## 2019-06-28 DIAGNOSIS — Z20822 Contact with and (suspected) exposure to covid-19: Secondary | ICD-10-CM

## 2019-06-28 DIAGNOSIS — L820 Inflamed seborrheic keratosis: Secondary | ICD-10-CM | POA: Diagnosis not present

## 2019-06-29 LAB — NOVEL CORONAVIRUS, NAA: SARS-CoV-2, NAA: DETECTED — AB

## 2019-07-10 ENCOUNTER — Other Ambulatory Visit: Payer: Medicare Other

## 2019-07-12 ENCOUNTER — Encounter: Payer: Self-pay | Admitting: Gastroenterology

## 2019-07-13 ENCOUNTER — Other Ambulatory Visit: Payer: Self-pay

## 2019-07-13 DIAGNOSIS — Z20822 Contact with and (suspected) exposure to covid-19: Secondary | ICD-10-CM

## 2019-07-13 DIAGNOSIS — Z20828 Contact with and (suspected) exposure to other viral communicable diseases: Secondary | ICD-10-CM | POA: Diagnosis not present

## 2019-07-14 LAB — NOVEL CORONAVIRUS, NAA: SARS-CoV-2, NAA: NOT DETECTED

## 2019-07-16 DIAGNOSIS — M545 Low back pain: Secondary | ICD-10-CM | POA: Diagnosis not present

## 2019-07-23 ENCOUNTER — Telehealth: Payer: Self-pay

## 2019-07-23 ENCOUNTER — Encounter: Payer: Self-pay | Admitting: Gastroenterology

## 2019-07-23 DIAGNOSIS — M419 Scoliosis, unspecified: Secondary | ICD-10-CM | POA: Diagnosis not present

## 2019-07-23 DIAGNOSIS — M5136 Other intervertebral disc degeneration, lumbar region: Secondary | ICD-10-CM | POA: Diagnosis not present

## 2019-07-23 DIAGNOSIS — M545 Low back pain: Secondary | ICD-10-CM | POA: Diagnosis not present

## 2019-07-23 DIAGNOSIS — M5416 Radiculopathy, lumbar region: Secondary | ICD-10-CM | POA: Diagnosis not present

## 2019-07-23 NOTE — Telephone Encounter (Signed)
Left message on voicemail to check on pt

## 2019-08-01 ENCOUNTER — Emergency Department
Admission: EM | Admit: 2019-08-01 | Discharge: 2019-08-01 | Disposition: A | Discharge status: Home / Self Care | Payer: Medicare Other | Attending: Emergency Medicine | Admitting: Emergency Medicine

## 2019-08-01 ENCOUNTER — Emergency Department: Payer: Medicare Other

## 2019-08-01 ENCOUNTER — Other Ambulatory Visit: Payer: Self-pay

## 2019-08-01 DIAGNOSIS — Y9289 Other specified places as the place of occurrence of the external cause: Secondary | ICD-10-CM | POA: Insufficient documentation

## 2019-08-01 DIAGNOSIS — S199XXA Unspecified injury of neck, initial encounter: Secondary | ICD-10-CM | POA: Diagnosis not present

## 2019-08-01 DIAGNOSIS — S0990XA Unspecified injury of head, initial encounter: Secondary | ICD-10-CM | POA: Diagnosis not present

## 2019-08-01 DIAGNOSIS — S0083XA Contusion of other part of head, initial encounter: Secondary | ICD-10-CM | POA: Insufficient documentation

## 2019-08-01 DIAGNOSIS — S0093XA Contusion of unspecified part of head, initial encounter: Secondary | ICD-10-CM

## 2019-08-01 DIAGNOSIS — Y9389 Activity, other specified: Secondary | ICD-10-CM | POA: Insufficient documentation

## 2019-08-01 DIAGNOSIS — Y999 Unspecified external cause status: Secondary | ICD-10-CM | POA: Insufficient documentation

## 2019-08-01 DIAGNOSIS — Z79899 Other long term (current) drug therapy: Secondary | ICD-10-CM | POA: Diagnosis not present

## 2019-08-01 DIAGNOSIS — W1789XA Other fall from one level to another, initial encounter: Secondary | ICD-10-CM | POA: Insufficient documentation

## 2019-08-01 LAB — COMPREHENSIVE METABOLIC PANEL
ALT: 29 U/L (ref 0–44)
AST: 28 U/L (ref 15–41)
Albumin: 4.1 g/dL (ref 3.5–5.0)
Alkaline Phosphatase: 56 U/L (ref 38–126)
Anion gap: 10 (ref 5–15)
BUN: 27 mg/dL — ABNORMAL HIGH (ref 8–23)
CO2: 27 mmol/L (ref 22–32)
Calcium: 9.4 mg/dL (ref 8.9–10.3)
Chloride: 104 mmol/L (ref 98–111)
Creatinine, Ser: 0.75 mg/dL (ref 0.44–1.00)
GFR calc Af Amer: >60 mL/min (ref >60)
GFR calc non Af Amer: >60 mL/min (ref >60)
Glucose, Bld: 114 mg/dL — ABNORMAL HIGH (ref 70–99)
Potassium: 3.8 mmol/L (ref 3.5–5.1)
Sodium: 141 mmol/L (ref 135–145)
Total Bilirubin: 0.7 mg/dL (ref 0.3–1.2)
Total Protein: 7 g/dL (ref 6.5–8.1)

## 2019-08-01 LAB — CBC
HCT: 42.1 % (ref 36.0–46.0)
Hemoglobin: 13.4 g/dL (ref 12.0–15.0)
MCH: 32.7 pg (ref 26.0–34.0)
MCHC: 31.8 g/dL (ref 30.0–36.0)
MCV: 102.7 fL — ABNORMAL HIGH (ref 80.0–100.0)
Platelets: 244 10*3/uL (ref 150–400)
RBC: 4.1 MIL/uL (ref 3.87–5.11)
RDW: 14 % (ref 11.5–15.5)
WBC: 7 10*3/uL (ref 4.0–10.5)
nRBC: 0 % (ref 0.0–0.2)

## 2019-08-01 MED ORDER — ACETAMINOPHEN 325 MG PO TABS
650.0000 mg | ORAL_TABLET | Freq: Once | ORAL | Status: AC
  Administered 2019-08-01: 650 mg via ORAL
  Filled 2019-08-01: qty 2

## 2019-08-01 NOTE — ED Triage Notes (Signed)
Pt fell off stool and hit head on tile floor. Hematoma noted to head, no laceration noted. Pt denies loc, or other complaints.

## 2019-08-01 NOTE — ED Notes (Signed)
Pt reports she will ambulate to lobby. Wheelchair offered. Pt in NAD at this time.

## 2019-08-01 NOTE — ED Provider Notes (Signed)
[facility] Emergency Department Provider Note  ____________________________________________  Time seen: Approximately 11:38 PM  I have reviewed the triage vital signs and the nursing notes.   HISTORY  Chief Complaint Fall    HPI [name] [name] is a 68 y.o. female presents to the emergency department after a fall that occurred this evening.  Patient reportedly was standing on a barstool when she lost her balance and struck the back of her head against the floor.  Patient denied loss of consciousness but states that she had to sit on the floor for several minutes after injury occurred.  She denies neck pain or numbness and tingling in the upper and lower extremities.  She denies chest pain, chest tightness or abdominal pain.  No abrasions or lacerations.  Patient noticed an occipital hematoma and became concerned.  No emesis, disorientation or confusion.  She denies similar injuries in the past.  No other alleviating measures of been attempted.        Past Medical History:  Diagnosis Date  . Anxiety and depression    . DJD (degenerative joint disease)   . Elevated cholesterol   . Endometrial polyp   . History of radiation therapy 10/25/16-12/01/16   right pelvis/60.2 Gy in 28 fractions  . IRRITABLE BOWEL SYNDROME, HX OF 05/15/2008  . MVP (mitral valve prolapse)    occ palpitations   . Ovarian ca ([place]) 07-2012      . Rheumatoid arthritis(714.0) ~ 2010   dr Ouida Sills  . Shingles     Patient Active Problem List   Diagnosis Date Noted  . Osteopenia 04/30/2019  . IBS (irritable bowel syndrome) 04/30/2019  . Ovarian cancer ([place]) 08/23/2012  . Rheumatoid arthritis ([place])   . HYPERCHOLESTEROLEMIA 05/15/2008  . Anxiety and depression 05/15/2008    Past Surgical History:  Procedure Laterality Date  . ABDOMINAL HYSTERECTOMY  08/22/2012   Procedure: HYSTERECTOMY ABDOMINAL;  Surgeon: Alvino Chapel, MD;  Location: [facility];  Service: Gynecology;   Laterality: N/A;  . BACK SURGERY  2008   Dr Trenton Gammon  . COLONOSCOPY  04-20-2004  . COLONOSCOPY W/ BIOPSIES  07/2014  . COLOSTOMY REVISION  08/22/2012   Procedure: COLON RESECTION SIGMOID;  Surgeon: Alvino Chapel, MD;  Location: [facility];  Service: Gynecology;;  Rectal Sigmoid resection and low rectal anastomosis  . CYST ON NECK  2011  . DEBULKING  08/22/2012   Procedure: DEBULKING;  Surgeon: Alvino Chapel, MD;  Location: [facility];  Service: Gynecology;  Laterality: N/A;  Radical tumor debulking, Bilateral Ureterolysis  . DILATION AND CURETTAGE OF UTERUS  2004   WITH HYSTEROSCOPY  . HAND SURGERY  1996  . HYSTEROSCOPY  2004   D&C  . NECK SURGERY  2009   [facility]  . OMENTECTOMY  08/22/2012   Procedure: OMENTECTOMY;  Surgeon: Alvino Chapel, MD;  Location: [facility];  Service: Gynecology;  Laterality: N/A;  . SALPINGOOPHORECTOMY  08/22/2012   Procedure: SALPINGO OOPHORECTOMY;  Surgeon: Alvino Chapel, MD;  Location: [facility];  Service: Gynecology;  Laterality: Bilateral;  . TUBAL LIGATION  1980    Prior to Admission medications   Medication Sig Start Date End Date Taking? Authorizing Provider  estazolam (PROSOM) 2 MG tablet TAKE 1/2 TO 1 (ONE-HALF TO ONE) TABLET BY MOUTH AT BEDTIME Patient not taking: Reported on 04/30/2019 05/27/18   Jearld Fenton, NP  fish oil-omega-3 fatty acids 1000 MG capsule Take 1 g by mouth daily.      [provider]  Multiple Vitamin (MULTIVITAMIN WITH MINERALS) TABS Take 1 tablet by mouth daily.    [provider]  pravastatin (PRAVACHOL) 40 MG tablet Take 0.5 tablets (20 mg total) by mouth daily. 05/04/19   Jearld Fenton, NP    Allergies Atorvastatin, Macrodantin, and Decongest-aid [pseudoephedrine]  Family History  Problem Relation Age of Onset  . Heart disease Mother   . Bladder Cancer Father 36  . Lymphoma Paternal Aunt   . Osteoporosis Sister   . Colon cancer Neg Hx   . Rectal cancer Neg Hx   . Stomach  cancer Neg Hx   . Diabetes Neg Hx   . Stroke Neg Hx     Social History Social History   Tobacco Use  . Smoking status: Never Smoker  . Smokeless tobacco: Never Used  Substance Use Topics  . Alcohol use: No  . Drug use: No     Review of Systems  Constitutional: No fever/chills Eyes: No visual changes. No discharge ENT: No upper respiratory complaints. Cardiovascular: no chest pain. Respiratory: no cough. No SOB. Gastrointestinal: No abdominal pain.  No nausea, no vomiting.  No diarrhea.  No constipation. Musculoskeletal: Negative for musculoskeletal pain. Skin: Negative for rash, abrasions, lacerations, ecchymosis. Neurological: Patient has occipital headache.   ____________________________________________   PHYSICAL EXAM:  VITAL SIGNS: ED Triage Vitals [08/01/19 2222]  Enc Vitals Group     BP (!) 163/84     Pulse Rate 91     Resp 20     Temp 98 F (36.7 C)     Temp Source Oral     SpO2 97 %     Weight 136 lb (61.7 kg)     Height 5\' 4"  (1.626 m)     Head Circumference      Peak Flow      Pain Score 0     Pain Loc      Pain Edu?      Excl. in [place]?      Constitutional: Alert and oriented. Well appearing and in no acute distress. Eyes: Conjunctivae are normal. PERRL. EOMI. Head: Atraumatic. ENT:      Nose: No congestion/rhinnorhea.      Mouth/Throat: Mucous membranes are moist.  Neck: No stridor.  Full range of motion.  No midline C-spine tenderness. Cardiovascular: Normal rate, regular rhythm. Normal S1 and S2.  Good peripheral circulation. Respiratory: Normal respiratory effort without tachypnea or retractions. Lungs CTAB. Good air entry to the bases with no decreased or absent breath sounds. Gastrointestinal: Bowel sounds 4 quadrants. Soft and nontender to palpation. No guarding or rigidity. No palpable masses. No distention. No CVA tenderness. Musculoskeletal: Full range of motion to all extremities. No gross deformities appreciated. Neurologic:   Normal speech and language. No gross focal neurologic deficits are appreciated.  Skin:  Skin is warm, dry and intact. No rash noted. Psychiatric: Mood and affect are normal. Speech and behavior are normal. Patient exhibits appropriate insight and judgement.   ____________________________________________   LABS (all labs ordered are listed, but only abnormal results are displayed)  Labs Reviewed  CBC - Abnormal; Notable for the following components:      Result Value   MCV 102.7 (*)    All other components within normal limits  COMPREHENSIVE METABOLIC PANEL - Abnormal; Notable for the following components:   Glucose, Bld 114 (*)    BUN 27 (*)    All other components within normal limits   ____________________________________________  EKG   ____________________________________________  RADIOLOGY I personally viewed and evaluated these images as part of my medical decision making, as well as reviewing the written report by the radiologist.  Ct Head Wo Contrast  Result Date: 08/01/2019 CLINICAL DATA:  68 year old female with fall and trauma to the head. EXAM: CT HEAD WITHOUT CONTRAST CT CERVICAL SPINE WITHOUT CONTRAST TECHNIQUE: Multidetector CT imaging of the head and cervical spine was performed following the standard protocol without intravenous contrast. Multiplanar CT image reconstructions of the cervical spine were also generated. COMPARISON:  None. FINDINGS: CT HEAD FINDINGS Brain: There is mild age-related atrophy and chronic microvascular ischemic changes. There is no acute intracranial hemorrhage. No mass effect or midline shift. No extra-axial fluid collection. Vascular: No hyperdense vessel or unexpected calcification. Skull: Normal. Negative for fracture or focal lesion. Sinuses/Orbits: No acute finding. Other: Scalp contusion over the posterior vertex. CT CERVICAL SPINE FINDINGS Alignment: No acute subluxation. There is grade 1 C2-C3 retrolisthesis. There is reversal of  normal cervical lordosis at C3-C6, likely chronic and related to degenerative changes. Skull base and vertebrae: No acute fracture. Osteopenia. Soft tissues and spinal canal: No prevertebral fluid or swelling. No visible canal hematoma. Disc levels: Multilevel degenerative changes with disc desiccation, disc space narrowing and endplate irregularity. C5-C6 disc spacer and anterior fusion plate. Upper chest: Negative. Other: None IMPRESSION: 1. No acute intracranial hemorrhage. 2. No acute/traumatic cervical spine pathology. Extensive multilevel degenerative changes and C5-C6 ACDF. Electronically Signed   By: Anner Crete M.D.   On: 08/01/2019 23:03   Ct Cervical Spine Wo Contrast  Result Date: 08/01/2019 CLINICAL DATA:  68 year old female with fall and trauma to the head. EXAM: CT HEAD WITHOUT CONTRAST CT CERVICAL SPINE WITHOUT CONTRAST TECHNIQUE: Multidetector CT imaging of the head and cervical spine was performed following the standard protocol without intravenous contrast. Multiplanar CT image reconstructions of the cervical spine were also generated. COMPARISON:  None. FINDINGS: CT HEAD FINDINGS Brain: There is mild age-related atrophy and chronic microvascular ischemic changes. There is no acute intracranial hemorrhage. No mass effect or midline shift. No extra-axial fluid collection. Vascular: No hyperdense vessel or unexpected calcification. Skull: Normal. Negative for fracture or focal lesion. Sinuses/Orbits: No acute finding. Other: Scalp contusion over the posterior vertex. CT CERVICAL SPINE FINDINGS Alignment: No acute subluxation. There is grade 1 C2-C3 retrolisthesis. There is reversal of normal cervical lordosis at C3-C6, likely chronic and related to degenerative changes. Skull base and vertebrae: No acute fracture. Osteopenia. Soft tissues and spinal canal: No prevertebral fluid or swelling. No visible canal hematoma. Disc levels: Multilevel degenerative changes with disc desiccation, disc  space narrowing and endplate irregularity. C5-C6 disc spacer and anterior fusion plate. Upper chest: Negative. Other: None IMPRESSION: 1. No acute intracranial hemorrhage. 2. No acute/traumatic cervical spine pathology. Extensive multilevel degenerative changes and C5-C6 ACDF. Electronically Signed   By: Anner Crete M.D.   On: 08/01/2019 23:03    ____________________________________________    PROCEDURES  Procedure(s) performed:    Procedures    Medications  acetaminophen (TYLENOL) tablet 650 mg (has no administration in time range)     ____________________________________________   INITIAL IMPRESSION / ASSESSMENT AND PLAN / ED COURSE  Pertinent labs & imaging results that were available during my care of the patient were reviewed by me and considered in my medical decision making (see chart for details).  Review of the [place] CSRS was performed in accordance of the [place] prior to dispensing any controlled drugs.           Assessment  and plan Fall 68 year old female presents to the emergency department with an occipital hematoma after she fell from a bar stool earlier tonight.  Patient was hypertensive at triage but vital signs were otherwise reassuring.  Neuro exam was without acute deficits.  Differential diagnosis included subdural hematoma, subarachnoid hemorrhage, skull fracture and electrolyte imbalance..  No evidence of intracranial bleed or skull fracture on CT head.  No evidence of C-spine fracture on CT of the cervical spine.  CBC and CMP were reassuring.  Patient accepted Tylenol in the emergency department for pain.  Advised patient continuing Tylenol and ibuprofen alternating at home for headache.  Return precautions were given to return to the emergency department with headache, confusion, disorientation or nausea/vomiting.  Patient lives with a family member and has easy access to the emergency department should symptoms worsen.  All patient questions were  answered.    ____________________________________________  FINAL CLINICAL IMPRESSION(S) / ED DIAGNOSES  Final diagnoses:  Contusion of head, unspecified part of head, initial encounter      NEW MEDICATIONS STARTED DURING THIS VISIT:  ED Discharge Orders    None          This chart was dictated using voice recognition software/Dragon. Despite best efforts to proofread, errors can occur which can change the meaning. Any change was purely unintentional.    Lannie Fields, PA-C 08/01/19 2342    Nance Pear, MD 08/01/19 [phone]

## 2019-08-09 ENCOUNTER — Encounter: Payer: Medicare Other | Admitting: Gastroenterology

## 2019-08-16 DIAGNOSIS — M5136 Other intervertebral disc degeneration, lumbar region: Secondary | ICD-10-CM | POA: Diagnosis not present

## 2019-08-17 ENCOUNTER — Other Ambulatory Visit: Payer: Self-pay

## 2019-08-17 ENCOUNTER — Ambulatory Visit
Admission: RE | Admit: 2019-08-17 | Discharge: 2019-08-17 | Disposition: A | Discharge status: Home / Self Care | Payer: Medicare Other | Source: Ambulatory Visit | Attending: Internal Medicine | Admitting: Internal Medicine

## 2019-08-17 DIAGNOSIS — Z780 Asymptomatic menopausal state: Secondary | ICD-10-CM | POA: Diagnosis not present

## 2019-08-17 DIAGNOSIS — M8580 Other specified disorders of bone density and structure, unspecified site: Secondary | ICD-10-CM

## 2019-08-17 DIAGNOSIS — M85851 Other specified disorders of bone density and structure, right thigh: Secondary | ICD-10-CM | POA: Diagnosis not present

## 2019-09-05 DIAGNOSIS — M5136 Other intervertebral disc degeneration, lumbar region: Secondary | ICD-10-CM | POA: Diagnosis not present

## 2019-09-12 ENCOUNTER — Telehealth: Payer: Self-pay | Admitting: *Deleted

## 2019-09-12 ENCOUNTER — Other Ambulatory Visit: Payer: Self-pay | Admitting: Gynecologic Oncology

## 2019-09-12 DIAGNOSIS — R1031 Right lower quadrant pain: Secondary | ICD-10-CM

## 2019-09-12 DIAGNOSIS — R140 Abdominal distension (gaseous): Secondary | ICD-10-CM

## 2019-09-12 DIAGNOSIS — C569 Malignant neoplasm of unspecified ovary: Secondary | ICD-10-CM

## 2019-09-12 NOTE — Telephone Encounter (Signed)
Called pt to notify her of upcoming appointments for lab work, CT scan and GYN/ONC Appointment.

## 2019-09-12 NOTE — Progress Notes (Signed)
Patient with complaints of new, persistent right abdominal pain similar to the pain she experience when she was diagnosed.  Also complaining of abdominal bloating and fullness. Plan for CA 125, Bmet for CT, then CT AP to evaluate for evidence of disease. Originally Stage IIIC ovarian ca with recurrence in 2018 which she received radiation for.

## 2019-09-12 NOTE — Telephone Encounter (Signed)
Patient called and stated " I have being having pain lately. It's on my right side from the cancer was before. It's bothering me a lot. I am having the bloating and fullness feeling. Do I need to just have some lab work done or be seen in the office?" Explained that I would give the message to [facility] APP and we would call her back

## 2019-09-17 ENCOUNTER — Other Ambulatory Visit: Payer: Self-pay

## 2019-09-17 ENCOUNTER — Inpatient Hospital Stay: Payer: Medicare Other | Attending: Gynecologic Oncology

## 2019-09-17 DIAGNOSIS — R1031 Right lower quadrant pain: Secondary | ICD-10-CM | POA: Diagnosis not present

## 2019-09-17 DIAGNOSIS — C775 Secondary and unspecified malignant neoplasm of intrapelvic lymph nodes: Secondary | ICD-10-CM | POA: Insufficient documentation

## 2019-09-17 DIAGNOSIS — H9319 Tinnitus, unspecified ear: Secondary | ICD-10-CM | POA: Insufficient documentation

## 2019-09-17 DIAGNOSIS — I341 Nonrheumatic mitral (valve) prolapse: Secondary | ICD-10-CM | POA: Diagnosis not present

## 2019-09-17 DIAGNOSIS — K589 Irritable bowel syndrome without diarrhea: Secondary | ICD-10-CM | POA: Insufficient documentation

## 2019-09-17 DIAGNOSIS — R6881 Early satiety: Secondary | ICD-10-CM | POA: Diagnosis not present

## 2019-09-17 DIAGNOSIS — C569 Malignant neoplasm of unspecified ovary: Secondary | ICD-10-CM

## 2019-09-17 DIAGNOSIS — M069 Rheumatoid arthritis, unspecified: Secondary | ICD-10-CM | POA: Diagnosis not present

## 2019-09-17 DIAGNOSIS — Z90722 Acquired absence of ovaries, bilateral: Secondary | ICD-10-CM | POA: Diagnosis not present

## 2019-09-17 DIAGNOSIS — I700 Atherosclerosis of aorta: Secondary | ICD-10-CM | POA: Diagnosis not present

## 2019-09-17 DIAGNOSIS — M1990 Unspecified osteoarthritis, unspecified site: Secondary | ICD-10-CM | POA: Insufficient documentation

## 2019-09-17 DIAGNOSIS — F329 Major depressive disorder, single episode, unspecified: Secondary | ICD-10-CM | POA: Diagnosis not present

## 2019-09-17 DIAGNOSIS — R140 Abdominal distension (gaseous): Secondary | ICD-10-CM | POA: Diagnosis not present

## 2019-09-17 DIAGNOSIS — E7800 Pure hypercholesterolemia, unspecified: Secondary | ICD-10-CM | POA: Insufficient documentation

## 2019-09-17 DIAGNOSIS — Z9221 Personal history of antineoplastic chemotherapy: Secondary | ICD-10-CM | POA: Insufficient documentation

## 2019-09-17 DIAGNOSIS — F419 Anxiety disorder, unspecified: Secondary | ICD-10-CM | POA: Diagnosis not present

## 2019-09-17 DIAGNOSIS — Z923 Personal history of irradiation: Secondary | ICD-10-CM | POA: Diagnosis not present

## 2019-09-17 DIAGNOSIS — M549 Dorsalgia, unspecified: Secondary | ICD-10-CM | POA: Insufficient documentation

## 2019-09-17 DIAGNOSIS — Z90710 Acquired absence of both cervix and uterus: Secondary | ICD-10-CM | POA: Diagnosis not present

## 2019-09-17 LAB — BASIC METABOLIC PANEL
Anion gap: 8 (ref 5–15)
BUN: 22 mg/dL (ref 8–23)
CO2: 29 mmol/L (ref 22–32)
Calcium: 8.9 mg/dL (ref 8.9–10.3)
Chloride: 105 mmol/L (ref 98–111)
Creatinine, Ser: 0.78 mg/dL (ref 0.44–1.00)
GFR calc Af Amer: >60 mL/min (ref >60)
GFR calc non Af Amer: >60 mL/min (ref >60)
Glucose, Bld: 89 mg/dL (ref 70–99)
Potassium: 4.2 mmol/L (ref 3.5–5.1)
Sodium: 142 mmol/L (ref 135–145)

## 2019-09-18 ENCOUNTER — Telehealth: Payer: Self-pay

## 2019-09-18 LAB — CA 125: Cancer Antigen (CA) 125: 6.5 U/mL (ref 0.0–38.1)

## 2019-09-18 NOTE — Telephone Encounter (Signed)
Told Ms Denunzio that her Ca-125 was good at 6.5 yesterday per Joylene John, NP. Pt verbalized understanding.

## 2019-09-19 ENCOUNTER — Ambulatory Visit (HOSPITAL_COMMUNITY)
Admission: RE | Admit: 2019-09-19 | Discharge: 2019-09-19 | Disposition: A | Discharge status: Home / Self Care | Payer: Medicare Other | Source: Ambulatory Visit | Attending: Gynecologic Oncology | Admitting: Gynecologic Oncology

## 2019-09-19 ENCOUNTER — Other Ambulatory Visit: Payer: Self-pay

## 2019-09-19 DIAGNOSIS — R1031 Right lower quadrant pain: Secondary | ICD-10-CM

## 2019-09-19 DIAGNOSIS — R140 Abdominal distension (gaseous): Secondary | ICD-10-CM | POA: Insufficient documentation

## 2019-09-19 DIAGNOSIS — R109 Unspecified abdominal pain: Secondary | ICD-10-CM | POA: Diagnosis not present

## 2019-09-19 DIAGNOSIS — C569 Malignant neoplasm of unspecified ovary: Secondary | ICD-10-CM

## 2019-09-19 MED ORDER — SODIUM CHLORIDE (PF) 0.9 % IJ SOLN
INTRAMUSCULAR | Status: AC
  Filled 2019-09-19: qty 50

## 2019-09-19 MED ORDER — IOHEXOL 300 MG/ML  SOLN
100.0000 mL | Freq: Once | INTRAMUSCULAR | Status: AC | PRN
  Administered 2019-09-19: 100 mL via INTRAVENOUS

## 2019-09-19 NOTE — Progress Notes (Signed)
Gynecologic Oncology Return Clinic Visit  09/21/19   Reason for Visit: abdominal symptoms and imaging concerning for ovarian cancer recurrence  Treatment History: Oncology History Overview Note  CA-125 10/10/113: 68 02/2013: 5.6, post adjuvant chemotherapy (03/2013-02/2016): 3.2-8.8 08/2016: 10.2, just prior to recurrence diagnosis 02/14/17: 7.1, 3 months post completion of RT 09/17/19: 6.5  In 2014, presented with pelvic pain and pressure, urinary frequency.  In 08/2016, presented with 3 months of constipation and RLQ pain. On exam, had some fullness and tenderness along right pelvic sidewall.    Ovarian cancer ([place])  07/12/2012 Imaging   PET: significant soft tissue thickening seen throughout the greater omentum suspicious for intraperitoneal carcinoma. There is minimal ascites in the pelvis. it revealed the areas of omental and peritoneal nodularity were hypermetabolic. There is a dominant pelvic omental mass measuring 10.4 x 3.3 cm. There is a small omental implants in the left side of the abdomen measuring 1.1 cm. There were multiple pelvic foci That wereperitoneal based And hypermetabolic. These included the cul-de-sac. She has small volume of ascites.    07/24/2012 Initial Biopsy   CT-guided revealed metastatic carcinoma. By [facility], malignant cells were positive for WT 1, cytokeratin 7, estrogen receptor. There were negative for TTF-1, progesterone receptor, cytokeratin 20, CEA, CD X2, and S100. Overall, the histologic features were highly suggestive of a primary gynecologic origin.   08/22/2012 Surgery   Radical debulking - TAH/BSO, ureterolysis, omentectomy, stripping of pelvic peritoneum, omentectomy, and rectosigmoid resection with low rectal reanastomosis Findings: stage IIIC ovarian cancer. Patient was optimally debulked. Debulking, however, required a rectosigmoid resection as well as total omentectomy total nominal hysterectomy bilateral salpingo-oophorectomy. She was optimally  debulked with only 5 mm nodules on the right diaphragm as residual disease.    08/23/2012 Initial Diagnosis   Ovarian cancer ([employer])   09/2012 - 02/06/2013 Chemotherapy   6 cycles of carboplatin/taxol    03/29/2013 Imaging   CT C/A/P: 1.  Marked response to therapy of omental/peritoneal disease.  No residual measurable disease identified. 2.  Interval hysterectomy and bilateral oophrectomy.   08/25/2016 Imaging   CT A/P: 1. Solitary new enlarged right external iliac lymph node, suspicious for metastatic nodal recurrence. 2. No additional sites of metastatic disease in the abdomen or pelvis. 3. Aortic atherosclerosis.   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 There is malignant range FDG uptake associated with the enlarged right external iliac lymph node compatible with metastatic adenopathy. 2. No additional sites of metastatic disease identified. 3. Aortic atherosclerosis.   10/25/2016 - 12/01/2016 Radiation Therapy   Radiation to right pelvis treated to 60.2Gy in 28 fractions   03/28/2017 Imaging   PET post RT: 1. The previously enlarged and hypermetabolic right external iliac lymph node is currently of normal size and the previous hypermetabolic activity has resolved. No current active malignancy identified. 2. 2 mm right mid kidney nonobstructive renal calculus. 3.  Aortic Atherosclerosis (ICD10-I70.0).   09/23/2017 Imaging   CT A/P: No evidence of bowel obstruction.  Normal appendix. No CT findings to account for the patient's worsening right lower quadrant abdominal pain. No evidence of recurrent or metastatic disease.   10/31/2017 Imaging   PET: No evidence local ovarian carcinoma recurrence in the pelvis. No metastatic adenopathy or distant metastatic disease.   05/15/2018 Imaging   PET: 1. No  evidence recurrent or metastatic  disease. 2.  Aortic atherosclerosis (ICD10-170.0).   09/19/2019 Imaging   CT A/P: Mild increase in size of several left upper quadrant peritoneal nodules measuring up to 11 mm, highly suspicious for increased peritoneal carcinomatosis. No other sites of metastatic disease identified. No evidence of ascites.     Interval History: Patient called on the ninth of this month with new persistent right abdominal pain, abdominal bloating and fullness.  Today, she reports that she is overall doing well but intermittently has right lower quadrant pain/soreness that sometimes radiates to her back or to the left.  She also has back pain related to arthritis and had an injection for this last month.  She continues to struggle with arthritis in her hands and other joints.  She endorses having a good appetite without nausea or emesis but notes increasing abdominal bloating and early satiety.  She has regular and normal bowel function but has noticed some mild discomfort for 20 or 30 minutes before emptying her bowels that is relieved by having a bowel movement.  She denies any urinary symptoms.  Past Medical/Surgical History: Past Medical History:  Diagnosis Date  . Anxiety and depression    . DJD (degenerative joint disease)   . Elevated cholesterol   . Endometrial polyp   . History of radiation therapy 10/25/16-12/01/16   right pelvis/60.2 Gy in 28 fractions  . IRRITABLE BOWEL SYNDROME, HX OF 05/15/2008  . MVP (mitral valve prolapse)    occ palpitations   . Ovarian ca ([place]) 07-2012      . Rheumatoid arthritis(714.0) ~ 2010   dr Ouida Sills  . Shingles     Past Surgical History:  Procedure Laterality Date  . ABDOMINAL HYSTERECTOMY  08/22/2012   Procedure: HYSTERECTOMY ABDOMINAL;  Surgeon: Alvino Chapel, MD;  Location: [facility];  Service: Gynecology;  Laterality: N/A;  . BACK SURGERY  2008   Dr Trenton Gammon  . COLONOSCOPY  04-20-2004  . COLONOSCOPY W/ BIOPSIES   07/2014  . COLOSTOMY REVISION  08/22/2012   Procedure: COLON RESECTION SIGMOID;  Surgeon: Alvino Chapel, MD;  Location: [facility];  Service: Gynecology;;  Rectal Sigmoid resection and low rectal anastomosis  . CYST ON NECK  2011  . DEBULKING  08/22/2012   Procedure: DEBULKING;  Surgeon: Alvino Chapel, MD;  Location: [facility];  Service: Gynecology;  Laterality: N/A;  Radical tumor debulking, Bilateral Ureterolysis  . DILATION AND CURETTAGE OF UTERUS  2004   WITH HYSTEROSCOPY  . HAND SURGERY  1996  . HYSTEROSCOPY  2004   D&C  . NECK SURGERY  2009   SPURS  . OMENTECTOMY  08/22/2012   Procedure: OMENTECTOMY;  Surgeon: Alvino Chapel, MD;  Location: [facility];  Service: Gynecology;  Laterality: N/A;  . SALPINGOOPHORECTOMY  08/22/2012   Procedure: SALPINGO OOPHORECTOMY;  Surgeon: Alvino Chapel, MD;  Location: [facility];  Service: Gynecology;  Laterality: Bilateral;  . TUBAL LIGATION  1980    Family History  Problem Relation Age of Onset  . Heart disease Mother   . Bladder Cancer Father 78  . Lymphoma Paternal Aunt   . Osteoporosis Sister   . Colon cancer Neg Hx   . Rectal cancer Neg Hx   . Stomach cancer Neg Hx   . Diabetes Neg Hx   . Stroke Neg Hx     Social History   Socioeconomic History  . Marital status: Married    Spouse name: Not on file  . Number of children: 2  . Years of  education: Not on file  . Highest education level: Not on file  Occupational History  . Occupation: disability d/t RA  Tobacco Use  . Smoking status: Never Smoker  . Smokeless tobacco: Never Used  Substance and Sexual Activity  . Alcohol use: No  . Drug use: No  . Sexual activity: Never    Birth control/protection: Post-menopausal, Surgical  Other Topics Concern  . Not on file  Social History Narrative   [name] is her daughter    Household-- pr and husband   Social Determinants of Health   Financial Resource Strain:   . Difficulty of Paying Living  Expenses: Not on file  Food Insecurity:   . Worried About Charity fundraiser in the Last Year: Not on file  . Ran Out of Food in the Last Year: Not on file  Transportation Needs:   . Lack of Transportation (Medical): Not on file  . Lack of Transportation (Non-Medical): Not on file  Physical Activity:   . Days of Exercise per Week: Not on file  . Minutes of Exercise per Session: Not on file  Stress:   . Feeling of Stress : Not on file  Social Connections:   . Frequency of Communication with Friends and Family: Not on file  . Frequency of Social Gatherings with Friends and Family: Not on file  . Attends Religious Services: Not on file  . Active Member of Clubs or Organizations: Not on file  . Attends Archivist Meetings: Not on file  . Marital Status: Not on file    Current Medications:  Current Outpatient Medications:  .  cholecalciferol (VITAMIN D3) 25 MCG (1000 UT) tablet, Take 1,000 Units by mouth daily., Disp: , Rfl:  .  fish oil-omega-3 fatty acids 1000 MG capsule, Take 1 g by mouth daily.  , Disp: , Rfl:  .  Multiple Vitamin (MULTIVITAMIN WITH MINERALS) TABS, Take 1 tablet by mouth daily., Disp: , Rfl:   Review of Symptoms: Pertinent positives as per HPI.  Additionally patient endorses ringing in the ears, back pain and joint pain. Denies appetite changes, fevers, chills, fatigue, unexplained weight changes. Denies hearing loss, neck lumps or masses, mouth sores, or voice changes. Denies cough or wheezing.  Denies shortness of breath. Denies chest pain or palpitations. Denies leg swelling. Denies abdominal distention, pain, blood in stools, constipation, diarrhea, nausea, vomiting, or early satiety. Denies pain with intercourse, dysuria, frequency, hematuria or incontinence. Denies hot flashes, pelvic pain, vaginal bleeding or vaginal discharge.   Denies muscle pain/cramps. Denies itching, rash, or wounds. Denies dizziness, headaches, numbness or  seizures. Denies swollen lymph nodes or glands, denies easy bruising or bleeding. Denies anxiety, depression, confusion, or decreased concentration.  Physical Exam: BP 135/64 (BP Location: Right Arm, Patient Position: Sitting)   Pulse 74   Temp 97.8 F (36.6 C) (Temporal)   Resp 18   Ht 5\' 4"  (1.626 m)   Wt 145 lb 5 oz (65.9 kg)   SpO2 96%   BMI 24.94 kg/m  General: Alert, oriented, no acute distress. HEENT: Atraumatic, sclera anicteric. Chest: Clear to auscultation bilaterally.  No wheezes rhonchi or rales. Cardiovascular: Regular rate and rhythm, no murmurs. Abdomen: soft, mild tenderness with palpation in the right lower quadrant around her radiation tattoo.  Normoactive bowel sounds.  No masses or hepatosplenomegaly appreciated.  Well-healed scar. Extremities: Grossly normal range of motion.  Warm, well perfused.  No edema bilaterally. Skin: No rashes or lesions noted. Lymphatics: No cervical, supraclavicular,  or inguinal adenopathy. GU: Normal appearing external genitalia without erythema, excoriation, or lesions.  Speculum exam reveals prominent rectocele.  Vaginal mucosa moderately atrophic, no lesions or masses.  Cuff intact.  Bimanual exam reveals cuff intact with mild thickening at the vaginal apex appreciated on both vaginal and rectovaginal exam.  No nodularity or other abnormal findings.    Laboratory & Radiologic Studies: 12/14:  Ref Range & Units 3 d ago  Cancer Antigen (CA) 125 0.0 - 38.1 U/mL 6.5    CT A/P pm 12/16: FINDINGS: Lower Chest: No acute findings.  Hepatobiliary: No hepatic masses identified. Probable tiny sub-cm cyst in the inferior right hepatic lobe is stable. Gallbladder is unremarkable. No evidence of biliary ductal dilatation.  Pancreas:  No mass or inflammatory changes.  Spleen: Within normal limits in size and appearance.  Adrenals/Urinary Tract: No masses identified. No evidence of hydronephrosis.  Stomach/Bowel: No evidence of  obstruction, inflammatory process or abnormal fluid collections.  Vascular/Lymphatic: No pathologically enlarged lymph nodes. No abdominal aortic aneurysm. Aortic atherosclerosis incidentally noted.  Reproductive: Prior hysterectomy noted. Adnexal regions are unremarkable in appearance. No evidence of ascites.  Other: A left subdiaphragmatic soft tissue nodule is seen measuring 11 mm on image 14/2, which is increased in size from 3 mm on prior study. Another tiny peritoneal nodule measuring 5 mm is seen along the lateral aspect of the splenic flexure of colon on image 25/2, which is new since previous study. A 7 mm calcified omental nodule is seen just deep to the anterior abdominal wall soft tissues. This remains stable in size but shows increased calcification since prior exam.  Musculoskeletal:  No suspicious bone lesions identified.  IMPRESSION: Mild increase in size of several left upper quadrant peritoneal nodules measuring up to 11 mm, highly suspicious for increased peritoneal carcinomatosis.  Assessment & Plan: [name] [name] is a 68 y.o. woman with history of HGS ovarian cancer, treated initially in 2014 with optimal cytoreduction and adjuvant platinum-based chemotherapy, treated for localized right pelvic LN recurrence in 2018 with RT, now presenting with abdominal symptoms and imaging concerning for peritoneal recurrence.  Reviewed CT results in detail with the patient.  I am very suspicious that between her imaging findings and new symptoms, that this signifies recurrence of her ovarian cancer.  Her CA-125 was normal this week although we discussed that it has not been a great marker for her.  We reviewed treatment options including close surveillance with repeat imaging in 3 months since the patient is not very bothered by her symptoms and the area seen on CT scan are approximately 1 cm.  We also discussed possible referral back to Dr. Benay Spice to consider systemic  treatment.  The patient is interested in discussing treatment but would like to wait until after the holidays.  I let her know that I would reach out to her medical oncologist to get her scheduled for an appointment in early to mid January.  I will ask Dr. Benay Spice if he would like her to have a PET scan before her appointment with him.  I do not know that biopsy of 1 of these lesions would be helpful or feasible.  The patient has never had genetic testing.  We discussed a referral for this today.  She was amenable.  All of her questions were answered.  Jeral Pinch, MD  Division of Gynecologic Oncology  Department of Obstetrics and Gynecology  [facility]

## 2019-09-21 ENCOUNTER — Encounter: Payer: Self-pay | Admitting: Gynecologic Oncology

## 2019-09-21 ENCOUNTER — Other Ambulatory Visit: Payer: Self-pay

## 2019-09-21 ENCOUNTER — Ambulatory Visit: Payer: Medicare Other | Admitting: Hematology and Oncology

## 2019-09-21 ENCOUNTER — Telehealth: Payer: Self-pay | Admitting: *Deleted

## 2019-09-21 ENCOUNTER — Inpatient Hospital Stay (HOSPITAL_BASED_OUTPATIENT_CLINIC_OR_DEPARTMENT_OTHER): Payer: Medicare Other | Admitting: Gynecologic Oncology

## 2019-09-21 VITALS — BP 135/64 | HR 74 | Temp 97.8°F | Resp 18 | Ht 64.0 in | Wt 145.3 lb

## 2019-09-21 DIAGNOSIS — C775 Secondary and unspecified malignant neoplasm of intrapelvic lymph nodes: Secondary | ICD-10-CM | POA: Diagnosis not present

## 2019-09-21 DIAGNOSIS — R6881 Early satiety: Secondary | ICD-10-CM

## 2019-09-21 DIAGNOSIS — R140 Abdominal distension (gaseous): Secondary | ICD-10-CM | POA: Diagnosis not present

## 2019-09-21 DIAGNOSIS — C569 Malignant neoplasm of unspecified ovary: Secondary | ICD-10-CM | POA: Diagnosis not present

## 2019-09-21 DIAGNOSIS — Z90722 Acquired absence of ovaries, bilateral: Secondary | ICD-10-CM | POA: Diagnosis not present

## 2019-09-21 DIAGNOSIS — I341 Nonrheumatic mitral (valve) prolapse: Secondary | ICD-10-CM | POA: Diagnosis not present

## 2019-09-21 DIAGNOSIS — M069 Rheumatoid arthritis, unspecified: Secondary | ICD-10-CM | POA: Diagnosis not present

## 2019-09-21 DIAGNOSIS — R1031 Right lower quadrant pain: Secondary | ICD-10-CM

## 2019-09-21 DIAGNOSIS — I700 Atherosclerosis of aorta: Secondary | ICD-10-CM | POA: Diagnosis not present

## 2019-09-21 DIAGNOSIS — Z90710 Acquired absence of both cervix and uterus: Secondary | ICD-10-CM | POA: Diagnosis not present

## 2019-09-21 DIAGNOSIS — Z9221 Personal history of antineoplastic chemotherapy: Secondary | ICD-10-CM

## 2019-09-21 DIAGNOSIS — M1990 Unspecified osteoarthritis, unspecified site: Secondary | ICD-10-CM | POA: Diagnosis not present

## 2019-09-21 DIAGNOSIS — F419 Anxiety disorder, unspecified: Secondary | ICD-10-CM | POA: Diagnosis not present

## 2019-09-21 DIAGNOSIS — Z923 Personal history of irradiation: Secondary | ICD-10-CM | POA: Diagnosis not present

## 2019-09-21 DIAGNOSIS — K589 Irritable bowel syndrome without diarrhea: Secondary | ICD-10-CM | POA: Diagnosis not present

## 2019-09-21 DIAGNOSIS — E7800 Pure hypercholesterolemia, unspecified: Secondary | ICD-10-CM | POA: Diagnosis not present

## 2019-09-21 NOTE — Patient Instructions (Signed)
It was a pleasure meeting you today.  I will coordinate with Dr. Benay Spice to get you on his schedule for after the holidays.  I will also let you know if he would prefer that you have a PET scan and/or a biopsy prior to that visit.  If you have any questions or concerns, please call us at [phone].

## 2019-09-21 NOTE — Telephone Encounter (Signed)
Called and left the patient a message with the appt for genetics

## 2019-09-26 ENCOUNTER — Telehealth: Payer: Self-pay | Admitting: Nurse Practitioner

## 2019-09-26 NOTE — Telephone Encounter (Signed)
Scheduled appt per 12/22 sch message- unable to reach pt . Left message with appt date and time

## 2019-10-04 ENCOUNTER — Telehealth: Payer: Self-pay | Admitting: Genetic Counselor

## 2019-10-04 NOTE — Telephone Encounter (Signed)
Called patient regarding upcoming virtual visit, unable to reach patient. Walk-n visit,

## 2019-10-08 ENCOUNTER — Other Ambulatory Visit: Payer: Self-pay

## 2019-10-08 ENCOUNTER — Telehealth: Payer: Self-pay

## 2019-10-08 ENCOUNTER — Inpatient Hospital Stay: Payer: Medicare Other | Attending: Genetic Counselor | Admitting: Genetic Counselor

## 2019-10-08 ENCOUNTER — Other Ambulatory Visit: Payer: Self-pay | Admitting: Genetic Counselor

## 2019-10-08 ENCOUNTER — Encounter: Payer: Self-pay | Admitting: Genetic Counselor

## 2019-10-08 ENCOUNTER — Inpatient Hospital Stay: Payer: Medicare Other

## 2019-10-08 DIAGNOSIS — C563 Malignant neoplasm of bilateral ovaries: Secondary | ICD-10-CM

## 2019-10-08 DIAGNOSIS — Z807 Family history of other malignant neoplasms of lymphoid, hematopoietic and related tissues: Secondary | ICD-10-CM | POA: Diagnosis not present

## 2019-10-08 DIAGNOSIS — Z8052 Family history of malignant neoplasm of bladder: Secondary | ICD-10-CM | POA: Diagnosis not present

## 2019-10-08 DIAGNOSIS — C561 Malignant neoplasm of right ovary: Secondary | ICD-10-CM | POA: Insufficient documentation

## 2019-10-08 DIAGNOSIS — C569 Malignant neoplasm of unspecified ovary: Secondary | ICD-10-CM | POA: Diagnosis not present

## 2019-10-08 DIAGNOSIS — D701 Agranulocytosis secondary to cancer chemotherapy: Secondary | ICD-10-CM | POA: Insufficient documentation

## 2019-10-08 DIAGNOSIS — F419 Anxiety disorder, unspecified: Secondary | ICD-10-CM | POA: Insufficient documentation

## 2019-10-08 DIAGNOSIS — R590 Localized enlarged lymph nodes: Secondary | ICD-10-CM | POA: Insufficient documentation

## 2019-10-08 LAB — GENETIC SCREENING ORDER

## 2019-10-08 NOTE — Telephone Encounter (Signed)
Told Ms Righetti that she is scheduled to see Dr. Gearldine Shown Nurse Practitioner Ned Card on Tuesday 10-16-19 at 1145.  Arrive at 1115 to check in.  Pt verbalized understanding.

## 2019-10-08 NOTE — Progress Notes (Signed)
REFERRING PROVIDER: Lafonda Mosses, MD [place],  [place] [address]  PRIMARY PROVIDER:  Jearld Fenton, NP  PRIMARY REASON FOR VISIT:  1. Malignant neoplasm of ovary, unspecified laterality ([place])   2. Family history of bladder cancer   3. Family history of lymphoma      HISTORY OF PRESENT ILLNESS:   [name] [name], a 69 y.o. female, was seen for a [place] cancer genetics consultation at the request of Dr. Berline Lopes due to a personal and family history of cancer.  [name] [name] presents to clinic today to discuss the possibility of a hereditary predisposition to cancer, genetic testing, and to further clarify her future cancer risks, as well as potential cancer risks for family members.   In 2013, at the age of 78, [name] [name] was diagnosed with cancer of the left ovary. The treatment plan chemotherapy and surgery.      CANCER HISTORY:  Oncology History Overview Note  CA-125 10/10/113: 68 02/2013: 5.6, post adjuvant chemotherapy (03/2013-02/2016): 3.2-8.8 08/2016: 10.2, just prior to recurrence diagnosis 02/14/17: 7.1, 3 months post completion of RT 09/17/19: 6.5  In 2014, presented with pelvic pain and pressure, urinary frequency.  In 08/2016, presented with 3 months of constipation and RLQ pain. On exam, had some fullness and tenderness along right pelvic sidewall.    Ovarian cancer ([place])  07/12/2012 Imaging   PET: significant soft tissue thickening seen throughout the greater omentum suspicious for intraperitoneal carcinoma. There is minimal ascites in the pelvis. it revealed the areas of omental and peritoneal nodularity were hypermetabolic. There is a dominant pelvic omental mass measuring 10.4 x 3.3 cm. There is a small omental implants in the left side of the abdomen measuring 1.1 cm. There were multiple pelvic foci That wereperitoneal based And hypermetabolic. These included the cul-de-sac. She has small volume of ascites.    07/24/2012 Initial Biopsy    CT-guided revealed metastatic carcinoma. By [facility], malignant cells were positive for WT 1, cytokeratin 7, estrogen receptor. There were negative for TTF-1, progesterone receptor, cytokeratin 20, CEA, CD X2, and S100. Overall, the histologic features were highly suggestive of a primary gynecologic origin.   08/22/2012 Surgery   Radical debulking - TAH/BSO, ureterolysis, omentectomy, stripping of pelvic peritoneum, omentectomy, and rectosigmoid resection with low rectal reanastomosis Findings: stage IIIC ovarian cancer. Patient was optimally debulked. Debulking, however, required a rectosigmoid resection as well as total omentectomy total nominal hysterectomy bilateral salpingo-oophorectomy. She was optimally debulked with only 5 mm nodules on the right diaphragm as residual disease.    08/23/2012 Initial Diagnosis   Ovarian cancer ([place])   09/2012 - 02/06/2013 Chemotherapy   6 cycles of carboplatin/taxol    03/29/2013 Imaging   CT C/A/P: 1.  Marked response to therapy of omental/peritoneal disease.  No residual measurable disease identified. 2.  Interval hysterectomy and bilateral oophrectomy.   08/25/2016 Imaging   CT A/P: 1. Solitary new enlarged right external iliac lymph node, suspicious for metastatic nodal recurrence. 2. No additional sites of metastatic disease in the abdomen or pelvis. 3. Aortic atherosclerosis.   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 There is malignant range FDG uptake associated with the enlarged right external iliac lymph node compatible with metastatic adenopathy. 2. No additional sites of metastatic disease identified. 3. Aortic atherosclerosis.   10/25/2016 - 12/01/2016 Radiation Therapy  Radiation to right pelvis treated to 60.2Gy in 28 fractions   03/28/2017 Imaging   PET post RT: 1.  The previously enlarged and hypermetabolic right external iliac lymph node is currently of normal size and the previous hypermetabolic activity has resolved. No current active malignancy identified. 2. 2 mm right mid kidney nonobstructive renal calculus. 3.  Aortic Atherosclerosis (ICD10-I70.0).   09/23/2017 Imaging   CT A/P: No evidence of bowel obstruction.  Normal appendix. No CT findings to account for the patient's worsening right lower quadrant abdominal pain. No evidence of recurrent or metastatic disease.   10/31/2017 Imaging   PET: No evidence local ovarian carcinoma recurrence in the pelvis. No metastatic adenopathy or distant metastatic disease.   05/15/2018 Imaging   PET: 1. No evidence recurrent or metastatic disease. 2.  Aortic atherosclerosis (ICD10-170.0).   09/19/2019 Imaging   CT A/P: Mild increase in size of several left upper quadrant peritoneal nodules measuring up to 11 mm, highly suspicious for increased peritoneal carcinomatosis. No other sites of metastatic disease identified. No evidence of ascites.      RISK FACTORS:  Menarche was at age 41.  First live birth at age 46.  OCP use for approximately 0 years.  Ovaries intact: no.  Hysterectomy: yes.  Menopausal status: postmenopausal.  HRT use: 0 years. Colonoscopy: yes; normal. Mammogram within the last year: yes. Number of breast biopsies: 0. Up to date with pelvic exams: yes. Any excessive radiation exposure in the past: no  Past Medical History:  Diagnosis Date  . Anxiety and depression    . DJD (degenerative joint disease)   . Elevated cholesterol   . Endometrial polyp   . Family history of bladder cancer   . Family history of lymphoma   . History of radiation therapy 10/25/16-12/01/16   right pelvis/60.2 Gy in 28 fractions  . IRRITABLE BOWEL SYNDROME, HX OF 05/15/2008  . MVP (mitral valve prolapse)    occ palpitations   . Ovarian ca ([place]) 07-2012      . Rheumatoid  arthritis(714.0) ~ 2010   dr Ouida Sills  . Shingles     Past Surgical History:  Procedure Laterality Date  . ABDOMINAL HYSTERECTOMY  08/22/2012   Procedure: HYSTERECTOMY ABDOMINAL;  Surgeon: Alvino Chapel, MD;  Location: [facility];  Service: Gynecology;  Laterality: N/A;  . BACK SURGERY  2008   Dr Trenton Gammon  . COLONOSCOPY  04-20-2004  . COLONOSCOPY W/ BIOPSIES  07/2014  . COLOSTOMY REVISION  08/22/2012   Procedure: COLON RESECTION SIGMOID;  Surgeon: Alvino Chapel, MD;  Location: [facility];  Service: Gynecology;;  Rectal Sigmoid resection and low rectal anastomosis  . CYST ON NECK  2011  . DEBULKING  08/22/2012   Procedure: DEBULKING;  Surgeon: Alvino Chapel, MD;  Location: [facility];  Service: Gynecology;  Laterality: N/A;  Radical tumor debulking, Bilateral Ureterolysis  . DILATION AND CURETTAGE OF UTERUS  2004   WITH HYSTEROSCOPY  . HAND SURGERY  1996  . HYSTEROSCOPY  2004   D&C  . NECK SURGERY  2009   [facility]  . OMENTECTOMY  08/22/2012   Procedure: OMENTECTOMY;  Surgeon: Alvino Chapel, MD;  Location: [facility];  Service: Gynecology;  Laterality: N/A;  . SALPINGOOPHORECTOMY  08/22/2012   Procedure: SALPINGO OOPHORECTOMY;  Surgeon: Alvino Chapel, MD;  Location: [facility];  Service: Gynecology;  Laterality: Bilateral;  . TUBAL LIGATION  1980    Social History   Socioeconomic History  . Marital status: Married  Spouse name: Not on file  . Number of children: 2  . Years of education: Not on file  . Highest education level: Not on file  Occupational History  . Occupation: disability d/t RA  Tobacco Use  . Smoking status: Never Smoker  . Smokeless tobacco: Never Used  Substance and Sexual Activity  . Alcohol use: No  . Drug use: No  . Sexual activity: Never    Birth control/protection: Post-menopausal, Surgical  Other Topics Concern  . Not on file  Social History Narrative   [name] is her daughter    Household-- pr and husband    Social Determinants of Health   Financial Resource Strain:   . Difficulty of Paying Living Expenses: Not on file  Food Insecurity:   . Worried About Charity fundraiser in the Last Year: Not on file  . Ran Out of Food in the Last Year: Not on file  Transportation Needs:   . Lack of Transportation (Medical): Not on file  . Lack of Transportation (Non-Medical): Not on file  Physical Activity:   . Days of Exercise per Week: Not on file  . Minutes of Exercise per Session: Not on file  Stress:   . Feeling of Stress : Not on file  Social Connections:   . Frequency of Communication with Friends and Family: Not on file  . Frequency of Social Gatherings with Friends and Family: Not on file  . Attends Religious Services: Not on file  . Active Member of Clubs or Organizations: Not on file  . Attends Archivist Meetings: Not on file  . Marital Status: Not on file     FAMILY HISTORY:  We obtained a detailed, 4-generation family history.  Significant diagnoses are listed below: Family History  Problem Relation Age of Onset  . Heart disease Mother   . Bladder Cancer Father 88  . Lymphoma Paternal Aunt   . Osteoporosis Sister   . Colon cancer Neg Hx   . Rectal cancer Neg Hx   . Stomach cancer Neg Hx   . Diabetes Neg Hx   . Stroke Neg Hx     The patient has a son and daughter who are cancer free.  She has two brothers and four sisters who do not have cancer.  Her mother is deceased and her father is living.    The patient's father had bladder cancer at 90.  He has two sisters and a brother.  One sister died of lymphoma.  The paternal grandparents are deceased.  The patient's mother died of a heart attack at 26. She had siblings but nothing is known about them.    [name] [name] is unaware of previous family history of genetic testing for hereditary cancer risks. Patient's maternal ancestors are of Caucasian descent, and paternal ancestors are of Caucasian descent. There is no  reported Ashkenazi Jewish ancestry. There is no known consanguinity.  GENETIC COUNSELING ASSESSMENT: [name] [name] is a 69 y.o. female with a personal and family history of cancer which is somewhat suggestive of a hereditary cancer syndrome and predisposition to cancer given her diagnosis of Ovarian cancer. We, therefore, discussed and recommended the following at today's visit.   DISCUSSION: We discussed that 15 - 20% of ovarian cancer is hereditary, with most cases associated with BRCA mutations.  There are other genes that can be associated with hereditary ovarian cancer syndromes.  These include BRIP1, RAD51C, RAD51D and the Lynch syndrome genes.  We discussed that testing  is beneficial for several reasons including knowing how to follow individuals after completing their treatment, identifying whether potential treatment options such as PARP inhibitors or immunotherapy would be beneficial, and understand if other family members could be at risk for cancer and allow them to undergo genetic testing.   We reviewed the characteristics, features and inheritance patterns of hereditary cancer syndromes. We also discussed genetic testing, including the appropriate family members to test, the process of testing, insurance coverage and turn-around-time for results. We discussed the implications of a negative, positive, carrier and/or variant of uncertain significant result. We recommended [name] [name] pursue genetic testing for the TumorNext-HRD+CancerNext gene panel.   Based on [name] [name]'s personal and family history of cancer, she meets medical criteria for genetic testing. Despite that she meets criteria, she may still have an out of pocket cost. We discussed that if her out of pocket cost for testing is over $100, the laboratory will call and confirm whether she wants to proceed with testing.  If the out of pocket cost of testing is less than $100 she will be billed by the genetic testing laboratory.   PLAN:  After considering the risks, benefits, and limitations, [name] [name] provided informed consent to pursue genetic testing and the blood sample was sent to [facility] for analysis of the TumorNext-HRD+CancerNext. Results should be available within approximately 2-3 weeks' time, at which point they will be disclosed by telephone to [name] [name], as will any additional recommendations warranted by these results. [name] [name] will receive a summary of her genetic counseling visit and a copy of her results once available. This information will also be available in Epic.   Lastly, we encouraged [name] [name] to remain in contact with cancer genetics annually so that we can continuously update the family history and inform her of any changes in cancer genetics and testing that may be of benefit for this family.   [name] [name] questions were answered to her satisfaction today. Our contact information was provided should additional questions or concerns arise. Thank you for the referral and allowing [place] to share in the care of your patient.   Maximo Spratling P. Florene Glen, [place], [facility] Licensed, Insurance risk surveyor [email or web address] phone: [phone]  The patient was seen for a total of 30 minutes in face-to-face genetic counseling.  This patient was discussed with Drs. Magrinat, Lindi Adie and/or Burr Medico who agrees with the above.    _______________________________________________________________________ For Office Staff:  Number of people involved in session: 1 Was an Intern/ student involved with case: no

## 2019-10-08 NOTE — Progress Notes (Signed)
geneic

## 2019-10-16 ENCOUNTER — Encounter: Payer: Self-pay | Admitting: Nurse Practitioner

## 2019-10-16 ENCOUNTER — Other Ambulatory Visit: Payer: Self-pay

## 2019-10-16 ENCOUNTER — Inpatient Hospital Stay (HOSPITAL_BASED_OUTPATIENT_CLINIC_OR_DEPARTMENT_OTHER): Payer: Medicare Other | Admitting: Nurse Practitioner

## 2019-10-16 VITALS — BP 136/70 | HR 90 | Temp 98.2°F | Resp 18 | Ht 64.0 in | Wt 146.8 lb

## 2019-10-16 DIAGNOSIS — C563 Malignant neoplasm of bilateral ovaries: Secondary | ICD-10-CM

## 2019-10-16 DIAGNOSIS — C562 Malignant neoplasm of left ovary: Secondary | ICD-10-CM

## 2019-10-16 DIAGNOSIS — R590 Localized enlarged lymph nodes: Secondary | ICD-10-CM | POA: Diagnosis not present

## 2019-10-16 DIAGNOSIS — D701 Agranulocytosis secondary to cancer chemotherapy: Secondary | ICD-10-CM | POA: Diagnosis not present

## 2019-10-16 DIAGNOSIS — C561 Malignant neoplasm of right ovary: Secondary | ICD-10-CM

## 2019-10-16 DIAGNOSIS — F419 Anxiety disorder, unspecified: Secondary | ICD-10-CM | POA: Diagnosis not present

## 2019-10-16 NOTE — Progress Notes (Addendum)
[facility] OFFICE PROGRESS NOTE   Diagnosis: Ovarian cancer  INTERVAL HISTORY:   [name] [name] was last seen by medical oncology at the [facility] 09/10/2016.  At that time she had an isolated recurrence in the right external iliac nodal chain.  She completed a course of radiation 10/25/2016 through 12/01/2016.  She presented in December of last year with abdominal pain, bloating, fullness.  CA-125 was in normal range at 6.5 on 09/17/2019.  CT abdomen/pelvis on 09/19/2019 showed mild increase in size of several left upper quadrant peritoneal nodules measuring up to 11 mm.  She was referred back to medical oncology to discuss initiation of systemic therapy.  [name] [name] reports intermittent "soreness" at the right lower abdomen for the past 5 months.  There has been no significant change in the discomfort.  The pain increases just prior to a bowel movement and is relieved following the bowel movement.  No nausea or vomiting.  She has a good appetite.  Objective:  Vital signs in last 24 hours:  Blood pressure 136/70, pulse 90, temperature 98.2 F (36.8 C), temperature source Temporal, resp. rate 18, height 5\' 4"  (1.626 m), weight 146 lb 12.8 oz (66.6 kg), SpO2 97 %.    HEENT: No thrush or ulcers. Lymphatics: No palpable cervical, supraclavicular, axillary or inguinal lymph nodes. Resp: Lungs clear bilaterally. Cardio: Regular rate and rhythm. GI: Abdomen soft and nontender.  No hepatomegaly.  No mass. Vascular: No leg edema.  Lab Results:  Lab Results  Component Value Date   WBC 7.0 08/01/2019   HGB 13.4 08/01/2019   HCT 42.1 08/01/2019   MCV 102.7 (H) 08/01/2019   PLT 244 08/01/2019   NEUTROABS 4.1 10/18/2014    Imaging:  No results found.  Medications: I have reviewed the patient's current medications.  Assessment/Plan: 1. 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 A 5 mm nodules remain on the right diaphragm. -  Cycle 1 of adjuvant Taxol/carboplatin chemotherapy initiated on 09/19/2012.   The CA 125 normalized.   She completed day 15 of cycle 6 on 02/06/2013.   Restaging CT evaluation 03/29/2013 showed no evidence of metastatic disease in the chest. There was marked improvement in appearance/resolution of previous described peritoneal/omental disease. There was no convincing evidence of residual disease. There was minimal increased density in the region of the omentum favored to be treatment-related. There was no pelvic adenopathy.  CA125 3.2 on 02/19/2014.  08/18/2016 CA-125 8  CT abdomen/pelvis 08/25/2016-solitary new enlarged right external iliac lymph node measuring 2.2 cm.  Biopsy 09/07/2016-adenocarcinoma consistent with high-grade serous carcinoma.  PET scan 09/22/2016-malignant range FDG uptake associated with the enlarged right external iliac lymph node compatible with metastatic adenopathy.  No additional sites of metastatic disease identified.  Radiation 10/25/2016-12/01/2016  PET scan 03/28/2017-previously enlarged hypermetabolic right external iliac node-normal in size with resolution of hypermetabolic activity, no active malignancy identified  PET scan 05/15/2018-no evidence of recurrent or metastatic disease, no hypermetabolic lymph nodes  CT abdomen/pelvis 09/19/2019-mild increase in the size of several left upper quadrant peritoneal nodules measuring up to 11 mm.  No other sites of metastatic disease identified.  No ascites. 2. Low abdomen/suprapubic pain prior to the exploratory laparotomy-likely secondary to omental/pelvic tumor; persistent mild pain in the lower abdomen  3. Chronic neck and back pain.  4. Anxiety -persistent despite Lexapro and Xanax. She has been evaluated by psychiatry. Currently on Lexapro.  5. Status post Port-A-Cath placement 09/22/2012. The  Port-A-Cath was removed on 04/03/2013.  6. Neutropenia secondary to chemotherapy- day 15 cycle 1 and cycle 3. Taxol/carboplatin not given secondary to neutropenia.  7.Herpes zoster involving a right thoracic dermatome July 2015. She completed a course of Valtrex. 8. Nodular bony prominence at the left pelvis on rectal exam 06/05/2015-likely a benign finding  Disposition: [name] [name] appears stable.  She was referred back to medical oncology following a CT scan from December 2020 showing a mild increase in the size of several left upper quadrant peritoneal nodules.  We reviewed the CT report/images with [name] [name] at today's visit.  A few nodules appear slightly larger.  The abdominal discomfort she is experiencing is located at the right low abdomen.  There is no obvious explanation for the discomfort on the recent CT scan.  Dr. Benay Spice recommends observation with a repeat CT scan at a 17-month interval.  [name] [name] agrees with this plan.  With regard to the intermittent discomfort at the right low abdomen, this tends to occur prior to a bowel movement.  She will begin a stool softener.  She will return for a CA-125 and CT scan in approximately 5 months.  We will see her in follow-up a few days later to review the results.  She will contact the office in the interim with any problems.  We specifically discussed increased abdominal pain.  Patient seen with Dr. Benay Spice.  25 minutes were spent face-to-face at today's visit with the majority of that time involved in counseling/coordination of care.    Ned Card ANP/GNP-BC   10/16/2019  11:58 AM This was a shared visit with Ned Card.  [name] [name] was interviewed and examined.  She is found to have a few peritoneal nodules on a restaging CT.  In the right lower quadrant discomfort prior to bowel movements is most likely unrelated to these nodules, but she may have additional unrecognized carcinomatosis. The peritoneal nodules most likely represent  progression of ovarian cancer.  I recommend observation given the small size of the nodules and lack of significant symptoms.  She will call for new symptoms.  She will be scheduled for a restaging CT at a 42-month interval.   Julieanne Manson, MD

## 2019-10-18 ENCOUNTER — Telehealth: Payer: Self-pay | Admitting: Oncology

## 2019-10-18 NOTE — Telephone Encounter (Signed)
Scheduled per los called and left msg. Mailed printout  

## 2019-10-26 ENCOUNTER — Telehealth: Payer: Self-pay | Admitting: Oncology

## 2019-10-26 NOTE — Telephone Encounter (Signed)
Returned patient's phone regarding rescheduling June lab appointment time, per patient's request appointment time has been rescheduled.

## 2019-11-27 ENCOUNTER — Encounter: Payer: Self-pay | Admitting: Genetic Counselor

## 2019-11-27 ENCOUNTER — Ambulatory Visit: Payer: Self-pay | Admitting: Genetic Counselor

## 2019-11-27 ENCOUNTER — Telehealth: Payer: Self-pay | Admitting: Genetic Counselor

## 2019-11-27 DIAGNOSIS — Z1379 Encounter for other screening for genetic and chromosomal anomalies: Secondary | ICD-10-CM | POA: Insufficient documentation

## 2019-11-27 DIAGNOSIS — C569 Malignant neoplasm of unspecified ovary: Secondary | ICD-10-CM

## 2019-11-27 NOTE — Progress Notes (Signed)
HPI:  [name] [name] was previously seen in the [facility] clinic due to a personal and family history of cancer and concerns regarding a hereditary predisposition to cancer. Please refer to our prior cancer genetics clinic note for more information regarding our discussion, assessment and recommendations, at the time. [name] [name] recent genetic test results were disclosed to her, as were recommendations warranted by these results. These results and recommendations are discussed in more detail below.  CANCER HISTORY:  Oncology History Overview Note  CA-125 10/10/113: 68 02/2013: 5.6, post adjuvant chemotherapy (03/2013-02/2016): 3.2-8.8 08/2016: 10.2, just prior to recurrence diagnosis 02/14/17: 7.1, 3 months post completion of RT 09/17/19: 6.5  In 2014, presented with pelvic pain and pressure, urinary frequency.  In 08/2016, presented with 3 months of constipation and RLQ pain. On exam, had some fullness and tenderness along right pelvic sidewall.    Ovarian cancer ([place])  07/12/2012 Imaging   PET: significant soft tissue thickening seen throughout the greater omentum suspicious for intraperitoneal carcinoma. There is minimal ascites in the pelvis. it revealed the areas of omental and peritoneal nodularity were hypermetabolic. There is a dominant pelvic omental mass measuring 10.4 x 3.3 cm. There is a small omental implants in the left side of the abdomen measuring 1.1 cm. There were multiple pelvic foci That wereperitoneal based And hypermetabolic. These included the cul-de-sac. She has small volume of ascites.    07/24/2012 Initial Biopsy   CT-guided revealed metastatic carcinoma. By [facility], malignant cells were positive for WT 1, cytokeratin 7, estrogen receptor. There were negative for TTF-1, progesterone receptor, cytokeratin 20, CEA, CD X2, and S100. Overall, the histologic features were highly suggestive of a primary gynecologic origin.   08/22/2012 Surgery   Radical debulking -  TAH/BSO, ureterolysis, omentectomy, stripping of pelvic peritoneum, omentectomy, and rectosigmoid resection with low rectal reanastomosis Findings: stage IIIC ovarian cancer. Patient was optimally debulked. Debulking, however, required a rectosigmoid resection as well as total omentectomy total nominal hysterectomy bilateral salpingo-oophorectomy. She was optimally debulked with only 5 mm nodules on the right diaphragm as residual disease.    08/23/2012 Initial Diagnosis   Ovarian cancer ([place])   09/2012 - 02/06/2013 Chemotherapy   6 cycles of carboplatin/taxol    03/29/2013 Imaging   CT C/A/P: 1.  Marked response to therapy of omental/peritoneal disease.  No residual measurable disease identified. 2.  Interval hysterectomy and bilateral oophrectomy.   08/25/2016 Imaging   CT A/P: 1. Solitary new enlarged right external iliac lymph node, suspicious for metastatic nodal recurrence. 2. No additional sites of metastatic disease in the abdomen or pelvis. 3. Aortic atherosclerosis.   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 There is malignant range FDG uptake associated with the enlarged right external iliac lymph node compatible with metastatic adenopathy. 2. No additional sites of metastatic disease identified. 3. Aortic atherosclerosis.   10/25/2016 - 12/01/2016 Radiation Therapy   Radiation to right pelvis treated to 60.2Gy in 28 fractions   03/28/2017 Imaging   PET post RT: 1. The previously enlarged and hypermetabolic right external iliac lymph node is currently of normal size and the previous hypermetabolic activity has resolved. No current active malignancy identified. 2. 2 mm right mid kidney nonobstructive renal calculus. 3.  Aortic Atherosclerosis (ICD10-I70.0).   09/23/2017 Imaging   CT A/P: No evidence  of bowel obstruction.  Normal appendix. No CT findings to account for the patient's worsening right lower quadrant abdominal pain. No evidence of recurrent or metastatic disease.   10/31/2017 Imaging   PET: No evidence local ovarian carcinoma recurrence in the pelvis. No metastatic adenopathy or distant metastatic disease.   05/15/2018 Imaging   PET: 1. No evidence recurrent or metastatic disease. 2.  Aortic atherosclerosis (ICD10-170.0).   09/19/2019 Imaging   CT A/P: Mild increase in size of several left upper quadrant peritoneal nodules measuring up to 11 mm, highly suspicious for increased peritoneal carcinomatosis. No other sites of metastatic disease identified. No evidence of ascites.   11/27/2019 Genetic Testing   CHEK2 p.N405K VUS found on the cancerNext HRD testing.  The CancerNext gene panel offered by [employer] includes sequencing and rearrangement analysis for the following 34 genes:   APC, ATM, BARD1, BMPR1A, BRCA1, BRCA2, BRIP1, CDH1, CDK4, CDKN2A, CHEK2, DICER1, HOXB13, EPCAM, GREM1, MLH1, MRE11A, MSH2, MSH6, MUTYH, NBN, NF1, PALB2, PMS2, POLD1, POLE, PTEN, RAD50, RAD51C, RAD51D, SMAD4, SMARCA4, STK11, and TP53.  The report date is November 27, 2019.  HRD testing did not identify any pathogenic variants in the tumor.  Therefore HRD is neg.     FAMILY HISTORY:  We obtained a detailed, 4-generation family history.  Significant diagnoses are listed below: Family History  Problem Relation Age of Onset   Heart disease Mother    Bladder Cancer Father 12   Lymphoma Paternal Aunt    Osteoporosis Sister    Colon cancer Neg Hx    Rectal cancer Neg Hx    Stomach cancer Neg Hx    Diabetes Neg Hx    Stroke Neg Hx     The patient has a son and daughter who are cancer free.  She has two brothers and four sisters who do not have cancer.  Her mother is deceased and her father is living.    The patient's father had bladder cancer at 41.  He has two sisters and  a brother.  One sister died of lymphoma.  The paternal grandparents are deceased.  The patient's mother died of a heart attack at 82. She had siblings but nothing is known about them.    [name] [name] is unaware of previous family history of genetic testing for hereditary cancer risks. Patient's maternal ancestors are of Caucasian descent, and paternal ancestors are of Caucasian descent. There is no reported Ashkenazi Jewish ancestry. There is no known consanguinity.    GENETIC TEST RESULTS: Genetic testing reported out on November 27, 2019 through the TumorNext HRD+CancerNext cancer panel found no pathogenic mutations. The CancerNext gene panel offered by [employer] includes sequencing and rearrangement analysis for the following 34 genes:   APC, ATM, BARD1, BMPR1A, BRCA1, BRCA2, BRIP1, CDH1, CDK4, CDKN2A, CHEK2, DICER1, HOXB13, EPCAM, GREM1, MLH1, MRE11A, MSH2, MSH6, MUTYH, NBN, NF1, PALB2, PMS2, POLD1, POLE, PTEN, RAD50, RAD51C, RAD51D, SMAD4, SMARCA4, STK11, and TP53.. The test report has been scanned into EPIC and is located under the Molecular Pathology section of the Results Review tab.  A portion of the result report is included below for reference.     We discussed with [name] [name] that because current genetic testing is not perfect, it is possible there may be a gene mutation in one of these genes that current testing cannot detect, but that chance is small.  We also discussed, that there could be another gene that has not yet been discovered, or that we have not yet tested, that is responsible for  the cancer diagnoses in the family. It is also possible there is a hereditary cause for the cancer in the family that [name] [name] did not inherit and therefore was not identified in her testing.  Therefore, it is important to remain in touch with cancer genetics in the future so that we can continue to offer [name] [name] the most up to date genetic testing.   Genetic testing did identify a variant  of uncertain significance (VUS) was identified in the CHEK2 gene called p.N405K.  At this time, it is unknown if this variant is associated with increased cancer risk or if this is a normal finding, but most variants such as this get reclassified to being inconsequential. It should not be used to make medical management decisions. With time, we suspect the lab will determine the significance of this variant, if any. If we do learn more about it, we will try to contact [name] [name] to discuss it further. However, it is important to stay in touch with [place] periodically and keep the address and phone number up to date.  ADDITIONAL GENETIC TESTING: We discussed with [name] [name] that there are other genes that are associated with increased cancer risk that can be analyzed. Should [name] [name] wish to pursue additional genetic testing, we are happy to discuss and coordinate this testing, at any time.    CANCER SCREENING RECOMMENDATIONS: [name] [name] test result is considered negative (normal).  This means that we have not identified a hereditary cause for her personal and family history of cancer at this time. Most cancers happen by chance and this negative test suggests that her cancer may fall into this category.    While reassuring, this does not definitively rule out a hereditary predisposition to cancer. It is still possible that there could be genetic mutations that are undetectable by current technology. There could be genetic mutations in genes that have not been tested or identified to increase cancer risk.  Therefore, it is recommended she continue to follow the cancer management and screening guidelines provided by her oncology and primary healthcare provider.   An individual's cancer risk and medical management are not determined by genetic test results alone. Overall cancer risk assessment incorporates additional factors, including personal medical history, family history, and any available genetic  information that may result in a personalized plan for cancer prevention and surveillance  RECOMMENDATIONS FOR FAMILY MEMBERS:  Individuals in this family might be at some increased risk of developing cancer, over the general population risk, simply due to the family history of cancer.  We recommended women in this family have a yearly mammogram beginning at age 58, or 50 years younger than the earliest onset of cancer, an annual clinical breast exam, and perform monthly breast self-exams. Women in this family should also have a gynecological exam as recommended by their primary provider. All family members should have a colonoscopy by age 66.  FOLLOW-UP: Lastly, we discussed with [name] [name] that cancer genetics is a rapidly advancing field and it is possible that new genetic tests will be appropriate for her and/or her family members in the future. We encouraged her to remain in contact with cancer genetics on an annual basis so we can update her personal and family histories and let her know of advances in cancer genetics that may benefit this family.   Our contact number was provided. [name] [name] questions were answered to her satisfaction, and she knows she is welcome to call us at anytime with additional  questions or concerns.   Roma Kayser, [place], [facility] Licensed, Certified Genetic Counselor [email or web address]

## 2019-11-27 NOTE — Telephone Encounter (Signed)
Revealed negative genetic testing.  Discussed that we do not know why she has ovarian cancer or why there is cancer in the family. It could be due to a different gene that we are not testing, or maybe our current technology may not be able to pick something up.  It will be important for her to keep in contact with genetics to keep up with whether additional testing may be needed.      

## 2020-03-06 ENCOUNTER — Other Ambulatory Visit: Payer: Self-pay | Admitting: Internal Medicine

## 2020-03-06 DIAGNOSIS — Z1231 Encounter for screening mammogram for malignant neoplasm of breast: Secondary | ICD-10-CM

## 2020-03-11 ENCOUNTER — Telehealth: Payer: Self-pay | Admitting: *Deleted

## 2020-03-11 NOTE — Telephone Encounter (Signed)
Called and moved the patient's appt from the morning to the afternoon due to a surgery case

## 2020-03-12 ENCOUNTER — Telehealth: Payer: Self-pay | Admitting: Oncology

## 2020-03-12 NOTE — Telephone Encounter (Signed)
R/s appt per 6/8 sch message - unable to reach pt ./ left message with new appt date and time.

## 2020-03-13 ENCOUNTER — Ambulatory Visit (HOSPITAL_COMMUNITY)
Admission: RE | Admit: 2020-03-13 | Discharge: 2020-03-13 | Disposition: A | Discharge status: Home / Self Care | Payer: Medicare Other | Source: Ambulatory Visit | Attending: Nurse Practitioner | Admitting: Nurse Practitioner

## 2020-03-13 ENCOUNTER — Other Ambulatory Visit: Payer: Self-pay

## 2020-03-13 ENCOUNTER — Inpatient Hospital Stay: Payer: Medicare Other | Attending: Oncology

## 2020-03-13 DIAGNOSIS — C563 Malignant neoplasm of bilateral ovaries: Secondary | ICD-10-CM

## 2020-03-13 DIAGNOSIS — K449 Diaphragmatic hernia without obstruction or gangrene: Secondary | ICD-10-CM | POA: Diagnosis not present

## 2020-03-13 DIAGNOSIS — F419 Anxiety disorder, unspecified: Secondary | ICD-10-CM | POA: Insufficient documentation

## 2020-03-13 DIAGNOSIS — C561 Malignant neoplasm of right ovary: Secondary | ICD-10-CM | POA: Diagnosis not present

## 2020-03-13 DIAGNOSIS — C569 Malignant neoplasm of unspecified ovary: Secondary | ICD-10-CM | POA: Insufficient documentation

## 2020-03-13 DIAGNOSIS — M47816 Spondylosis without myelopathy or radiculopathy, lumbar region: Secondary | ICD-10-CM | POA: Insufficient documentation

## 2020-03-13 DIAGNOSIS — Z923 Personal history of irradiation: Secondary | ICD-10-CM | POA: Insufficient documentation

## 2020-03-13 DIAGNOSIS — Z8616 Personal history of COVID-19: Secondary | ICD-10-CM | POA: Diagnosis not present

## 2020-03-13 DIAGNOSIS — C562 Malignant neoplasm of left ovary: Secondary | ICD-10-CM | POA: Insufficient documentation

## 2020-03-13 DIAGNOSIS — M542 Cervicalgia: Secondary | ICD-10-CM | POA: Insufficient documentation

## 2020-03-13 DIAGNOSIS — I700 Atherosclerosis of aorta: Secondary | ICD-10-CM | POA: Diagnosis not present

## 2020-03-13 DIAGNOSIS — Z90710 Acquired absence of both cervix and uterus: Secondary | ICD-10-CM | POA: Insufficient documentation

## 2020-03-13 DIAGNOSIS — Z90722 Acquired absence of ovaries, bilateral: Secondary | ICD-10-CM | POA: Diagnosis not present

## 2020-03-13 DIAGNOSIS — G8929 Other chronic pain: Secondary | ICD-10-CM | POA: Insufficient documentation

## 2020-03-13 DIAGNOSIS — C786 Secondary malignant neoplasm of retroperitoneum and peritoneum: Secondary | ICD-10-CM | POA: Diagnosis not present

## 2020-03-13 DIAGNOSIS — K8020 Calculus of gallbladder without cholecystitis without obstruction: Secondary | ICD-10-CM | POA: Diagnosis not present

## 2020-03-13 DIAGNOSIS — M549 Dorsalgia, unspecified: Secondary | ICD-10-CM | POA: Diagnosis not present

## 2020-03-13 DIAGNOSIS — Z9221 Personal history of antineoplastic chemotherapy: Secondary | ICD-10-CM | POA: Diagnosis not present

## 2020-03-13 LAB — BASIC METABOLIC PANEL - CANCER CENTER ONLY
Anion gap: 7 (ref 5–15)
BUN: 22 mg/dL (ref 8–23)
CO2: 27 mmol/L (ref 22–32)
Calcium: 9.5 mg/dL (ref 8.9–10.3)
Chloride: 104 mmol/L (ref 98–111)
Creatinine: 0.84 mg/dL (ref 0.44–1.00)
GFR, Est AFR Am: >60 mL/min (ref >60)
GFR, Estimated: >60 mL/min (ref >60)
Glucose, Bld: 89 mg/dL (ref 70–99)
Potassium: 4.1 mmol/L (ref 3.5–5.1)
Sodium: 138 mmol/L (ref 135–145)

## 2020-03-13 MED ORDER — SODIUM CHLORIDE (PF) 0.9 % IJ SOLN
INTRAMUSCULAR | Status: AC
  Filled 2020-03-13: qty 50

## 2020-03-13 MED ORDER — IOHEXOL 300 MG/ML  SOLN
100.0000 mL | Freq: Once | INTRAMUSCULAR | Status: AC | PRN
  Administered 2020-03-13: 100 mL via INTRAVENOUS

## 2020-03-14 ENCOUNTER — Other Ambulatory Visit: Payer: Medicare Other

## 2020-03-14 LAB — CA 125: Cancer Antigen (CA) 125: 7.6 U/mL (ref 0.0–38.1)

## 2020-03-17 ENCOUNTER — Other Ambulatory Visit: Payer: Self-pay

## 2020-03-17 ENCOUNTER — Telehealth: Payer: Self-pay | Admitting: *Deleted

## 2020-03-17 ENCOUNTER — Inpatient Hospital Stay: Payer: Medicare Other | Admitting: Oncology

## 2020-03-17 VITALS — BP 170/96 | HR 86 | Temp 97.9°F | Resp 18 | Ht 64.0 in | Wt 146.8 lb

## 2020-03-17 DIAGNOSIS — G8929 Other chronic pain: Secondary | ICD-10-CM | POA: Diagnosis not present

## 2020-03-17 DIAGNOSIS — Z90710 Acquired absence of both cervix and uterus: Secondary | ICD-10-CM | POA: Diagnosis not present

## 2020-03-17 DIAGNOSIS — K449 Diaphragmatic hernia without obstruction or gangrene: Secondary | ICD-10-CM | POA: Diagnosis not present

## 2020-03-17 DIAGNOSIS — C569 Malignant neoplasm of unspecified ovary: Secondary | ICD-10-CM | POA: Diagnosis not present

## 2020-03-17 DIAGNOSIS — I700 Atherosclerosis of aorta: Secondary | ICD-10-CM | POA: Diagnosis not present

## 2020-03-17 DIAGNOSIS — Z923 Personal history of irradiation: Secondary | ICD-10-CM | POA: Diagnosis not present

## 2020-03-17 DIAGNOSIS — Z90722 Acquired absence of ovaries, bilateral: Secondary | ICD-10-CM | POA: Diagnosis not present

## 2020-03-17 DIAGNOSIS — M47816 Spondylosis without myelopathy or radiculopathy, lumbar region: Secondary | ICD-10-CM | POA: Diagnosis not present

## 2020-03-17 DIAGNOSIS — M549 Dorsalgia, unspecified: Secondary | ICD-10-CM | POA: Diagnosis not present

## 2020-03-17 DIAGNOSIS — Z9221 Personal history of antineoplastic chemotherapy: Secondary | ICD-10-CM | POA: Diagnosis not present

## 2020-03-17 DIAGNOSIS — C786 Secondary malignant neoplasm of retroperitoneum and peritoneum: Secondary | ICD-10-CM | POA: Diagnosis not present

## 2020-03-17 DIAGNOSIS — M542 Cervicalgia: Secondary | ICD-10-CM | POA: Diagnosis not present

## 2020-03-17 DIAGNOSIS — F419 Anxiety disorder, unspecified: Secondary | ICD-10-CM | POA: Diagnosis not present

## 2020-03-17 DIAGNOSIS — Z8616 Personal history of COVID-19: Secondary | ICD-10-CM | POA: Diagnosis not present

## 2020-03-17 NOTE — Telephone Encounter (Signed)
Patient called and confirmed her for this week

## 2020-03-17 NOTE — Addendum Note (Signed)
Addended by: [name] B on: 03/17/2020 01:54 PM   Modules accepted: Level of Service

## 2020-03-17 NOTE — Progress Notes (Addendum)
[facility] OFFICE PROGRESS NOTE   Diagnosis: Ovarian cancer  INTERVAL HISTORY:   [name] [name] returns as scheduled.  She feels well.  Good energy level.  She has pain sometimes before a bowel movement.  No other pain.  No other complaint.  Objective:  Vital signs in last 24 hours:  Blood pressure (!) 170/96, pulse 86, temperature 97.9 F (36.6 C), temperature source Temporal, resp. rate 18, height '5\' 4"'  (1.626 m), weight 146 lb 12.8 oz (66.6 kg), SpO2 97 %.   Lymphatics: No cervical, supraclavicular, axillary, or inguinal nodes Resp: Lungs clear bilaterally Cardio: Regular rate and rhythm GI: Mild tenderness in the right abdomen, no hepatosplenomegaly, no mass, no apparent ascites Vascular: No leg edema    Lab Results:  Lab Results  Component Value Date   WBC 7.0 08/01/2019   HGB 13.4 08/01/2019   HCT 42.1 08/01/2019   MCV 102.7 (H) 08/01/2019   PLT 244 08/01/2019   NEUTROABS 4.1 10/18/2014    CMP  Lab Results  Component Value Date   NA 138 03/13/2020   K 4.1 03/13/2020   CL 104 03/13/2020   CO2 27 03/13/2020   GLUCOSE 89 03/13/2020   BUN 22 03/13/2020   CREATININE 0.84 03/13/2020   CALCIUM 9.5 03/13/2020   PROT 7.0 08/01/2019   ALBUMIN 4.1 08/01/2019   AST 28 08/01/2019   ALT 29 08/01/2019   ALKPHOS 56 08/01/2019   BILITOT 0.7 08/01/2019   GFRNONAA >60 03/13/2020   GFRAA >60 03/13/2020   CA-125 on 03/13/2020 -7.6  Imaging:  CT Abdomen Pelvis W Contrast  Result Date: 03/14/2020 CLINICAL DATA:  Stage 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 Restaging. EXAM: CT ABDOMEN AND PELVIS WITH CONTRAST TECHNIQUE: Multidetector CT imaging of the abdomen and pelvis was performed using the standard protocol following bolus administration of intravenous contrast. CONTRAST:  17m OMNIPAQUE IOHEXOL 300 MG/ML  SOLN COMPARISON:  09/19/2019 CT abdomen/pelvis.  FINDINGS: Lower chest: No significant pulmonary nodules or acute consolidative airspace disease. Hepatobiliary: Normal liver size. Hypodense subcentimeter inferior right liver lesion is too small to characterize and is unchanged, probably benign. No new liver lesions. Normal gallbladder with no radiopaque cholelithiasis. No biliary ductal dilatation. Pancreas: Normal, with no mass or duct dilation. Spleen: Normal size. No mass. Adrenals/Urinary Tract: Normal adrenals. Stable scattered subcentimeter hypodense bilateral renal cortical lesions, too small to characterize, considered benign. No new renal lesions. No hydronephrosis. Stable small right extrarenal pelvis. Normal bladder. Stomach/Bowel: Small hiatal hernia. Otherwise normal nondistended stomach. Normal caliber small bowel with no small bowel wall thickening. Normal appendix. Oral contrast transits to the rectum. Normal large bowel with no diverticulosis, large bowel wall thickening or pericolonic fat stranding. Vascular/Lymphatic: Atherosclerotic nonaneurysmal abdominal aorta. Patent portal, splenic, hepatic and renal veins. No pathologically enlarged lymph nodes in the abdomen or pelvis. Reproductive: Status post hysterectomy, with no abnormal findings at the vaginal cuff. No adnexal mass. Other: No pneumoperitoneum, ascites or focal fluid collection. Three clustered solid peritoneal implants under the anterior left hemidiaphragm, largest 1.3 x 1.1 cm (series 2/image 13), previously 1.1 x 0.9 cm, mildly increased. Irregular 1.0 cm solid implant adjacent to splenic flexure (series 2/image 23), previously 0.3 cm, increased. Left upper quadrant 0.8 cm solid implant adjacent to small bowel loops (series 2/image 27), previously 0.5 cm, increased. No additional significant peritoneal implants. Musculoskeletal: No aggressive appearing focal osseous lesions. Marked lumbar spondylosis. IMPRESSION: 1. Interval growth of several  left upper quadrant peritoneal  metastases, as detailed. No ascites. 2. No adenopathy. 3. Aortic Atherosclerosis (ICD10-I70.0). Electronically Signed   By: Ilona Sorrel M.D.   On: 03/14/2020 14:52    Medications: I have reviewed the patient's current medications.   Assessment/Plan:  1. 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 A 5 mm nodules remain on the right diaphragm. -  TumorNext paired germline/tumor analyses: No somatic variants detected, germline CHEK2  VUS   Cycle 1 of adjuvant Taxol/carboplatin chemotherapy initiated on 09/19/2012.   The CA 125 normalized.   She completed day 15 of cycle 6 on 02/06/2013.   Restaging CT evaluation 03/29/2013 showed no evidence of metastatic disease in the chest. There was marked improvement in appearance/resolution of previous described peritoneal/omental disease. There was no convincing evidence of residual disease. There was minimal increased density in the region of the omentum favored to be treatment-related. There was no pelvic adenopathy.  CA125 3.2 on 02/19/2014.  08/18/2016 CA-125 8  CT abdomen/pelvis 08/25/2016-solitary new enlarged right external iliac lymph node measuring 2.2 cm.  Biopsy 09/07/2016-adenocarcinoma consistent with high-grade serous carcinoma.  PET scan 09/22/2016-malignant range FDG uptake associated with the enlarged right external iliac lymph node compatible with metastatic adenopathy.  No additional sites of metastatic disease identified.  Radiation 10/25/2016-12/01/2016  PET scan 03/28/2017-previously enlarged hypermetabolic right external iliac node-normal in size with resolution of hypermetabolic activity, no active malignancy identified  PET scan 05/15/2018-no evidence of recurrent or metastatic disease, no hypermetabolic lymph nodes  CT abdomen/pelvis 09/19/2019-mild increase in the size  of several left upper quadrant peritoneal nodules measuring up to 11 mm.  No other sites of metastatic disease identified.  No ascites.  CT abdomen/pelvis 03/14/2020-slight enlargement of several left upper quadrant peritoneal nodules, no ascites, no other evidence of disease progression 2. Low abdomen/suprapubic pain prior to the exploratory laparotomy-likely secondary to omental/pelvic tumor; persistent mild pain in the lower abdomen  3. Chronic neck and back pain.  4. Anxiety -persistent despite Lexapro and Xanax. She has been evaluated by psychiatry. Currently on Lexapro. 5. Status post Port-A-Cath placement 09/22/2012. The Port-A-Cath was removed on 04/03/2013.  6. Neutropenia secondary to chemotherapy- day 15 cycle 1 and cycle 3. Taxol/carboplatin not given secondary to neutropenia.  7.Herpes zoster involving a right thoracic dermatome July 2015. She completed a course of Valtrex. 8. Nodular bony prominence at the left pelvis on rectal exam 06/05/2015-likely a benign finding    Disposition: Ms. Zarr appears stable.  The restaging CT reveals minimal enlargement of several peritoneal nodules.  I reviewed the CT images with Ms. Aller.  At least 2 of the nodules appear to have been present in 2019.  She appears asymptomatic from the recurrent ovarian cancer.  The disease appears to be behaving in an indolent fashion.  She is comfortable with continued observation.  She will return for an office visit and repeat CT in 6 months.  Ms. Heringer has not received the COVID-19 vaccine.  She had a COVID-19 infection in September of last year.  [name] Coder, MD  03/17/2020  11:27 AM

## 2020-03-19 ENCOUNTER — Telehealth: Payer: Self-pay | Admitting: Oncology

## 2020-03-19 NOTE — Telephone Encounter (Signed)
Scheduled per los. Called and left msg. Mailed printout  °

## 2020-03-21 ENCOUNTER — Inpatient Hospital Stay (HOSPITAL_BASED_OUTPATIENT_CLINIC_OR_DEPARTMENT_OTHER): Payer: Medicare Other | Admitting: Gynecologic Oncology

## 2020-03-21 ENCOUNTER — Other Ambulatory Visit: Payer: Self-pay

## 2020-03-21 ENCOUNTER — Encounter: Payer: Self-pay | Admitting: Gynecologic Oncology

## 2020-03-21 VITALS — BP 126/71 | HR 75 | Temp 98.5°F | Resp 16 | Ht 64.0 in | Wt 148.4 lb

## 2020-03-21 DIAGNOSIS — C786 Secondary malignant neoplasm of retroperitoneum and peritoneum: Secondary | ICD-10-CM

## 2020-03-21 DIAGNOSIS — C569 Malignant neoplasm of unspecified ovary: Secondary | ICD-10-CM

## 2020-03-21 DIAGNOSIS — Z90722 Acquired absence of ovaries, bilateral: Secondary | ICD-10-CM

## 2020-03-21 DIAGNOSIS — G8929 Other chronic pain: Secondary | ICD-10-CM | POA: Diagnosis not present

## 2020-03-21 DIAGNOSIS — I700 Atherosclerosis of aorta: Secondary | ICD-10-CM | POA: Diagnosis not present

## 2020-03-21 DIAGNOSIS — Z8616 Personal history of COVID-19: Secondary | ICD-10-CM | POA: Diagnosis not present

## 2020-03-21 DIAGNOSIS — F419 Anxiety disorder, unspecified: Secondary | ICD-10-CM | POA: Diagnosis not present

## 2020-03-21 DIAGNOSIS — M542 Cervicalgia: Secondary | ICD-10-CM | POA: Diagnosis not present

## 2020-03-21 DIAGNOSIS — Z923 Personal history of irradiation: Secondary | ICD-10-CM

## 2020-03-21 DIAGNOSIS — K449 Diaphragmatic hernia without obstruction or gangrene: Secondary | ICD-10-CM | POA: Diagnosis not present

## 2020-03-21 DIAGNOSIS — Z9221 Personal history of antineoplastic chemotherapy: Secondary | ICD-10-CM | POA: Diagnosis not present

## 2020-03-21 DIAGNOSIS — Z90710 Acquired absence of both cervix and uterus: Secondary | ICD-10-CM

## 2020-03-21 DIAGNOSIS — M549 Dorsalgia, unspecified: Secondary | ICD-10-CM | POA: Diagnosis not present

## 2020-03-21 DIAGNOSIS — M47816 Spondylosis without myelopathy or radiculopathy, lumbar region: Secondary | ICD-10-CM | POA: Diagnosis not present

## 2020-03-21 NOTE — Patient Instructions (Signed)
Your exam today was normal!  Your recent Ca1 25 was 7.  I will see you back in September.  If anything changes in terms of your abdominal pain or new symptoms, please call me to come in sooner than that.  The office number is [phone].

## 2020-03-21 NOTE — Progress Notes (Signed)
Gynecologic Oncology Return Clinic Visit  03/21/20  Reason for Visit: surveillance in the setting of recurrent ovarian cancer  Treatment History: Oncology History Overview Note  CA-125 10/10/113: 68 02/2013: 5.6, post adjuvant chemotherapy (03/2013-02/2016): 3.2-8.8 08/2016: 10.2, just prior to recurrence diagnosis 02/14/17: 7.1, 3 months post completion of RT 09/17/19: 6.5  In 2014, presented with pelvic pain and pressure, urinary frequency.  In 08/2016, presented with 3 months of constipation and RLQ pain. On exam, had some fullness and tenderness along right pelvic sidewall.    Ovarian cancer ([place])  07/12/2012 Imaging   PET: significant soft tissue thickening seen throughout the greater omentum suspicious for intraperitoneal carcinoma. There is minimal ascites in the pelvis. it revealed the areas of omental and peritoneal nodularity were hypermetabolic. There is a dominant pelvic omental mass measuring 10.4 x 3.3 cm. There is a small omental implants in the left side of the abdomen measuring 1.1 cm. There were multiple pelvic foci That wereperitoneal based And hypermetabolic. These included the cul-de-sac. She has small volume of ascites.    07/24/2012 Initial Biopsy   CT-guided revealed metastatic carcinoma. By [facility], malignant cells were positive for WT 1, cytokeratin 7, estrogen receptor. There were negative for TTF-1, progesterone receptor, cytokeratin 20, CEA, CD X2, and S100. Overall, the histologic features were highly suggestive of a primary gynecologic origin.   08/22/2012 Surgery   Radical debulking - TAH/BSO, ureterolysis, omentectomy, stripping of pelvic peritoneum, omentectomy, and rectosigmoid resection with low rectal reanastomosis Findings: stage IIIC ovarian cancer. Patient was optimally debulked. Debulking, however, required a rectosigmoid resection as well as total omentectomy total nominal hysterectomy bilateral salpingo-oophorectomy. She was optimally debulked with only  5 mm nodules on the right diaphragm as residual disease.    08/23/2012 Initial Diagnosis   Ovarian cancer ([place])   09/2012 - 02/06/2013 Chemotherapy   6 cycles of carboplatin/taxol    03/29/2013 Imaging   CT C/A/P: 1.  Marked response to therapy of omental/peritoneal disease.  No residual measurable disease identified. 2.  Interval hysterectomy and bilateral oophrectomy.   08/25/2016 Imaging   CT A/P: 1. Solitary new enlarged right external iliac lymph node, suspicious for metastatic nodal recurrence. 2. No additional sites of metastatic disease in the abdomen or pelvis. 3. Aortic atherosclerosis.   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 There is malignant range FDG uptake associated with the enlarged right external iliac lymph node compatible with metastatic adenopathy. 2. No additional sites of metastatic disease identified. 3. Aortic atherosclerosis.   10/25/2016 - 12/01/2016 Radiation Therapy   Radiation to right pelvis treated to 60.2Gy in 28 fractions   03/28/2017 Imaging   PET post RT: 1. The previously enlarged and hypermetabolic right external iliac lymph node is currently of normal size and the previous hypermetabolic activity has resolved. No current active malignancy identified. 2. 2 mm right mid kidney nonobstructive renal calculus. 3.  Aortic Atherosclerosis (ICD10-I70.0).   09/23/2017 Imaging   CT A/P: No evidence of bowel obstruction.  Normal appendix. No CT findings to account for the patient's worsening right lower quadrant abdominal pain. No evidence of recurrent or metastatic disease.   10/31/2017 Imaging   PET: No evidence local ovarian carcinoma recurrence in the pelvis. No metastatic adenopathy or distant metastatic disease.   05/15/2018 Imaging   PET: 1. No evidence recurrent  or metastatic disease. 2.  Aortic atherosclerosis (ICD10-170.0).   09/19/2019 Imaging   CT A/P: Mild increase in size of several left upper quadrant peritoneal nodules measuring up to 11 mm, highly suspicious for increased peritoneal carcinomatosis. No other sites of metastatic disease identified. No evidence of ascites.   11/27/2019 Genetic Testing   CHEK2 p.[id number] VUS found on the cancerNext HRD testing.  The CancerNext gene panel offered by [employer] includes sequencing and rearrangement analysis for the following 34 genes:   APC, ATM, BARD1, BMPR1A, BRCA1, BRCA2, BRIP1, CDH1, CDK4, CDKN2A, CHEK2, DICER1, HOXB13, EPCAM, GREM1, MLH1, MRE11A, MSH2, MSH6, MUTYH, NBN, NF1, PALB2, PMS2, POLD1, POLE, PTEN, RAD50, RAD51C, RAD51D, SMAD4, SMARCA4, STK11, and TP53.  The report date is November 27, 2019.  HRD testing did not identify any pathogenic variants in the tumor.  Therefore HRD is neg.     Interval History: Overall reports doing well since her last visit with me.  Recently saw Dr. Benay Spice.  She endorses having a good appetite without any nausea or emesis.  She has occasional constipation but reports overall normal bowel function and watches what she eats.  Does not take any medications to help with bowel function.  Denies any urinary symptoms.  Denies any vaginal bleeding or discharge.  Continues to have right lower quadrant pain that she describes as soreness and rates it a 3 out of 10.  This pain is intermittent and often occurs if she moves or pushes on the area.  It is not significant enough for her to take any pain medicine.  Patient has been enjoying doing activities outside including gardening.  She is scheduled for mammogram next week.  Past Medical/Surgical History: Past Medical History:  Diagnosis Date   Anxiety and depression     DJD (degenerative joint disease)    Elevated cholesterol    Endometrial polyp    Family history of bladder cancer    Family history of  lymphoma    History of radiation therapy 10/25/16-12/01/16   right pelvis/60.2 Gy in 28 fractions   IRRITABLE BOWEL SYNDROME, HX OF 05/15/2008   MVP (mitral valve prolapse)    occ palpitations    Ovarian ca ([place]) 07-2012       Rheumatoid arthritis(714.0) ~ 2010   dr Ouida Sills   Shingles     Past Surgical History:  Procedure Laterality Date   ABDOMINAL HYSTERECTOMY  08/22/2012   Procedure: HYSTERECTOMY ABDOMINAL;  Surgeon: Alvino Chapel, MD;  Location: [facility];  Service: Gynecology;  Laterality: N/A;   BACK SURGERY  2008   Dr Trenton Gammon   COLONOSCOPY  04-20-2004   COLONOSCOPY W/ BIOPSIES  07/2014   COLOSTOMY REVISION  08/22/2012   Procedure: COLON RESECTION SIGMOID;  Surgeon: Alvino Chapel, MD;  Location: [facility];  Service: Gynecology;;  Rectal Sigmoid resection and low rectal anastomosis   CYST ON NECK  2011   DEBULKING  08/22/2012   Procedure: DEBULKING;  Surgeon: Alvino Chapel, MD;  Location: [facility];  Service: Gynecology;  Laterality: N/A;  Radical tumor debulking, Bilateral Ureterolysis   DILATION AND CURETTAGE OF UTERUS  2004   WITH HYSTEROSCOPY   HAND SURGERY  1996   HYSTEROSCOPY  2004   [facility]   NECK SURGERY  2009   SPURS   OMENTECTOMY  08/22/2012   Procedure: OMENTECTOMY;  Surgeon: Alvino Chapel, MD;  Location: [facility];  Service: Gynecology;  Laterality: N/A;   SALPINGOOPHORECTOMY  08/22/2012   Procedure: SALPINGO OOPHORECTOMY;  Surgeon: Alvino Chapel, MD;  Location: [facility];  Service: Gynecology;  Laterality: Bilateral;   TUBAL LIGATION  1980    Family History  Problem Relation Age of Onset   Heart disease Mother    Bladder Cancer Father 32   Lymphoma Paternal Aunt    Osteoporosis Sister    Colon cancer Neg Hx    Rectal cancer Neg Hx    Stomach cancer Neg Hx    Diabetes Neg Hx    Stroke Neg Hx     Social History   Socioeconomic History   Marital status: Married    Spouse name: Not on file    Number of children: 2   Years of education: Not on file   Highest education level: Not on file  Occupational History   Occupation: disability d/t RA  Tobacco Use   Smoking status: Never Smoker   Smokeless tobacco: Never Used  Scientific laboratory technician Use: Never used  Substance and Sexual Activity   Alcohol use: No   Drug use: No   Sexual activity: Never    Birth control/protection: Post-menopausal, Surgical  Other Topics Concern   Not on file  Social History Narrative   [name] is her daughter    Household-- pr and husband   Social Determinants of Radio broadcast assistant Strain:    Difficulty of Paying Living Expenses:   Food Insecurity:    Worried About Charity fundraiser in the Last Year:    Arboriculturist in the Last Year:   Transportation Needs:    Film/video editor (Medical):    Lack of Transportation (Non-Medical):   Physical Activity:    Days of Exercise per Week:    Minutes of Exercise per Session:   Stress:    Feeling of Stress :   Social Connections:    Frequency of Communication with Friends and Family:    Frequency of Social Gatherings with Friends and Family:    Attends Religious Services:    Active Member of Clubs or Organizations:    Attends Archivist Meetings:    Marital Status:     Current Medications:  Current Outpatient Medications:    cholecalciferol (VITAMIN D3) 25 MCG (1000 UT) tablet, Take 1,000 Units by mouth daily., Disp: , Rfl:    fish oil-omega-3 fatty acids 1000 MG capsule, Take 1 g by mouth daily.  , Disp: , Rfl:    Multiple Vitamin (MULTIVITAMIN WITH MINERALS) TABS, Take 1 tablet by mouth daily., Disp: , Rfl:   Review of Systems: Denies appetite changes, fevers, chills, fatigue, unexplained weight changes. Denies hearing loss, neck lumps or masses, mouth sores, ringing in ears or voice changes. Denies cough or wheezing.  Denies shortness of breath. Denies chest pain or  palpitations. Denies leg swelling. Denies abdominal distention, pain, blood in stools, constipation, diarrhea, nausea, vomiting, or early satiety. Denies pain with intercourse, dysuria, frequency, hematuria or incontinence. Denies hot flashes, pelvic pain, vaginal bleeding or vaginal discharge.   Denies joint pain, back pain or muscle pain/cramps. Denies itching, rash, or wounds. Denies dizziness, headaches, numbness or seizures. Denies swollen lymph nodes or glands, denies easy bruising or bleeding. Denies anxiety, depression, confusion, or decreased concentration.  Physical Exam: BP 126/71 (BP Location: Left Arm, Patient Position: Sitting)    Pulse 75    Temp 98.5 F (36.9 C) (Oral)    Resp 16    Ht _0  (1.626 m)    Wt 148 lb 6 oz (67.3 kg)  SpO2 97%    BMI 25.47 kg/m  General: Alert, oriented, no acute distress. HEENT: Normocephalic, atraumatic, sclera anicteric. Chest: Unlabored breathing on room air. Cardiovascular: Regular rate and rhythm, no murmurs. Abdomen: soft, nondistended, mild tenderness to deep palpation in the right lower quadrant, point tenderness.  Normoactive bowel sounds.  No masses or hepatosplenomegaly appreciated.  Well-healed scar. Extremities: Grossly normal range of motion.  Warm, well perfused.  No edema bilaterally. Skin: No rashes or lesions noted. Lymphatics: No cervical, supraclavicular, or inguinal adenopathy. GU: Normal appearing external genitalia without erythema, excoriation, or lesions.  Speculum exam reveals pelvic organ prolapse, stage III, no lesions or masses noted within the vagina or at the cuff.  Bimanual exam reveals cuff intact, no nodularity.  Rectovaginal exam confirms these findings.  Laboratory & Radiologic Studies: CT A/P on 03/13/20: FINDINGS: Lower chest: No significant pulmonary nodules or acute consolidative airspace disease.  Hepatobiliary: Normal liver size. Hypodense subcentimeter inferior right liver lesion is too small to  characterize and is unchanged, probably benign. No new liver lesions. Normal gallbladder with no radiopaque cholelithiasis. No biliary ductal dilatation.  Pancreas: Normal, with no mass or duct dilation.  Spleen: Normal size. No mass.  Adrenals/Urinary Tract: Normal adrenals. Stable scattered subcentimeter hypodense bilateral renal cortical lesions, too small to characterize, considered benign. No new renal lesions. No hydronephrosis. Stable small right extrarenal pelvis. Normal bladder.  Stomach/Bowel: Small hiatal hernia. Otherwise normal nondistended stomach. Normal caliber small bowel with no small bowel wall thickening. Normal appendix. Oral contrast transits to the rectum. Normal large bowel with no diverticulosis, large bowel wall thickening or pericolonic fat stranding.  Vascular/Lymphatic: Atherosclerotic nonaneurysmal abdominal aorta. Patent portal, splenic, hepatic and renal veins. No pathologically enlarged lymph nodes in the abdomen or pelvis.  Reproductive: Status post hysterectomy, with no abnormal findings at the vaginal cuff. No adnexal mass.  Other: No pneumoperitoneum, ascites or focal fluid collection. Three clustered solid peritoneal implants under the anterior left hemidiaphragm, largest 1.3 x 1.1 cm (series 2/image 13), previously 1.1 x 0.9 cm, mildly increased. Irregular 1.0 cm solid implant adjacent to splenic flexure (series 2/image 23), previously 0.3 cm, increased. Left upper quadrant 0.8 cm solid implant adjacent to small bowel loops (series 2/image 27), previously 0.5 cm, increased. No additional significant peritoneal implants.  Musculoskeletal: No aggressive appearing focal osseous lesions. Marked lumbar spondylosis.  IMPRESSION: 1. Interval growth of several left upper quadrant peritoneal metastases, as detailed. No ascites. 2. No adenopathy. 3. Aortic Atherosclerosis (ICD10-I70.0).   Ref Range & Units 8 d ago  (03/13/20) 6 mo ago   (09/17/19) 10 mo ago  (05/07/19) 1 yr ago  (11/13/18) 1 yr ago  (08/11/18)  Cancer Antigen (CA) 125 0.0 - 38.1 U/mL 7.6  6.5 CM  6.3 CM  6.7 CM  5.9 CM     Assessment & Plan: RENELL COAXUM is a 69 y.o. woman with history of HGS ovarian cancer, treated initially in 2014 with optimal cytoreduction and adjuvant platinum-based chemotherapy, treated for localized right pelvic LN recurrence in 2018 with RT, now with imaging findings of indolent recurrent disease.  The patient recently underwent repeat CT scan which shows some mild increase in her left upper quadrant peritoneal nodules.  She continues to be asymptomatic with the exception of some mild right lower quadrant pain which I think is unrelated.  Her Ca1 25 continues to be normal at 7.6.  Given that she continues to be asymptomatic and doing well with small disease burden, I think it is  very reasonable to defer any treatment at this time.  The patient met with Dr. Benay Spice earlier this week and the plan is to repeat CT scan to assess for change in these peritoneal nodules in 6 months.  I talked to the patient about seeing her in 3 months so that she can alternate visits between the 2 of [place].  Since her last visit with me, the patient has undergone genetic testing which was negative.  I have asked her to call me if she develops any new or worsening symptoms before her visit with me in September.  20 minutes of total time was spent for this patient encounter, including preparation, face-to-face counseling with the patient and coordination of care, and documentation of the encounter.  Jeral Pinch, MD  Division of Gynecologic Oncology  Department of Obstetrics and Gynecology  [facility]

## 2020-03-24 DIAGNOSIS — M542 Cervicalgia: Secondary | ICD-10-CM | POA: Diagnosis not present

## 2020-03-25 ENCOUNTER — Ambulatory Visit
Admission: RE | Admit: 2020-03-25 | Discharge: 2020-03-25 | Disposition: A | Discharge status: Home / Self Care | Payer: Medicare Other | Source: Ambulatory Visit | Attending: Internal Medicine | Admitting: Internal Medicine

## 2020-03-25 ENCOUNTER — Other Ambulatory Visit: Payer: Self-pay

## 2020-03-25 DIAGNOSIS — Z1231 Encounter for screening mammogram for malignant neoplasm of breast: Secondary | ICD-10-CM | POA: Diagnosis not present

## 2020-03-26 ENCOUNTER — Other Ambulatory Visit: Payer: Self-pay | Admitting: Specialist

## 2020-03-26 DIAGNOSIS — M542 Cervicalgia: Secondary | ICD-10-CM

## 2020-03-27 ENCOUNTER — Other Ambulatory Visit: Payer: Self-pay

## 2020-03-27 ENCOUNTER — Ambulatory Visit
Admission: RE | Admit: 2020-03-27 | Discharge: 2020-03-27 | Disposition: A | Discharge status: Home / Self Care | Payer: Medicare Other | Source: Ambulatory Visit | Attending: Specialist | Admitting: Specialist

## 2020-03-27 DIAGNOSIS — M50322 Other cervical disc degeneration at C5-C6 level: Secondary | ICD-10-CM | POA: Diagnosis not present

## 2020-03-27 DIAGNOSIS — M542 Cervicalgia: Secondary | ICD-10-CM

## 2020-04-08 DIAGNOSIS — M542 Cervicalgia: Secondary | ICD-10-CM | POA: Diagnosis not present

## 2020-05-01 ENCOUNTER — Encounter: Payer: Self-pay | Admitting: Internal Medicine

## 2020-05-01 ENCOUNTER — Other Ambulatory Visit: Payer: Self-pay

## 2020-05-01 ENCOUNTER — Ambulatory Visit (INDEPENDENT_AMBULATORY_CARE_PROVIDER_SITE_OTHER): Payer: Medicare Other | Admitting: Internal Medicine

## 2020-05-01 VITALS — BP 124/76 | HR 81 | Temp 98.3°F | Ht 63.5 in | Wt 147.0 lb

## 2020-05-01 DIAGNOSIS — M069 Rheumatoid arthritis, unspecified: Secondary | ICD-10-CM | POA: Diagnosis not present

## 2020-05-01 DIAGNOSIS — F419 Anxiety disorder, unspecified: Secondary | ICD-10-CM

## 2020-05-01 DIAGNOSIS — F329 Major depressive disorder, single episode, unspecified: Secondary | ICD-10-CM

## 2020-05-01 DIAGNOSIS — K581 Irritable bowel syndrome with constipation: Secondary | ICD-10-CM

## 2020-05-01 DIAGNOSIS — C569 Malignant neoplasm of unspecified ovary: Secondary | ICD-10-CM

## 2020-05-01 DIAGNOSIS — Z0000 Encounter for general adult medical examination without abnormal findings: Secondary | ICD-10-CM

## 2020-05-01 DIAGNOSIS — M8580 Other specified disorders of bone density and structure, unspecified site: Secondary | ICD-10-CM

## 2020-05-01 DIAGNOSIS — F32A Depression, unspecified: Secondary | ICD-10-CM

## 2020-05-01 DIAGNOSIS — E7800 Pure hypercholesterolemia, unspecified: Secondary | ICD-10-CM

## 2020-05-01 DIAGNOSIS — R8290 Unspecified abnormal findings in urine: Secondary | ICD-10-CM | POA: Diagnosis not present

## 2020-05-01 LAB — COMPREHENSIVE METABOLIC PANEL
ALT: 22 U/L (ref 0–35)
AST: 19 U/L (ref 0–37)
Albumin: 4.2 g/dL (ref 3.5–5.2)
Alkaline Phosphatase: 57 U/L (ref 39–117)
BUN: 21 mg/dL (ref 6–23)
CO2: 30 mEq/L (ref 19–32)
Calcium: 9.5 mg/dL (ref 8.4–10.5)
Chloride: 104 mEq/L (ref 96–112)
Creatinine, Ser: 0.75 mg/dL (ref 0.40–1.20)
GFR: 76.3 mL/min (ref >60.00)
Glucose, Bld: 87 mg/dL (ref 70–99)
Potassium: 4.2 mEq/L (ref 3.5–5.1)
Sodium: 140 mEq/L (ref 135–145)
Total Bilirubin: 0.6 mg/dL (ref 0.2–1.2)
Total Protein: 6.6 g/dL (ref 6.0–8.3)

## 2020-05-01 LAB — CBC
HCT: 43.5 % (ref 36.0–46.0)
Hemoglobin: 14.3 g/dL (ref 12.0–15.0)
MCHC: 32.9 g/dL (ref 30.0–36.0)
MCV: 98.9 fl (ref 78.0–100.0)
Platelets: 256 10*3/uL (ref 150.0–400.0)
RBC: 4.4 Mil/uL (ref 3.87–5.11)
RDW: 13.7 % (ref 11.5–15.5)
WBC: 6.4 10*3/uL (ref 4.0–10.5)

## 2020-05-01 LAB — POC URINALSYSI DIPSTICK (AUTOMATED)
Bilirubin, UA: NEGATIVE
Blood, UA: NEGATIVE
Glucose, UA: NEGATIVE
Ketones, UA: NEGATIVE
Leukocytes, UA: NEGATIVE
Nitrite, UA: NEGATIVE
Protein, UA: NEGATIVE
Spec Grav, UA: 1.025 (ref 1.010–1.025)
Urobilinogen, UA: 0.2 E.U./dL
pH, UA: 5.5 (ref 5.0–8.0)

## 2020-05-01 LAB — LIPID PANEL
Cholesterol: 275 mg/dL — ABNORMAL HIGH (ref 0–200)
HDL: 53.9 mg/dL (ref >39.00)
LDL Cholesterol: 185 mg/dL — ABNORMAL HIGH (ref 0–99)
NonHDL: 221.41
Total CHOL/HDL Ratio: 5
Triglycerides: 182 mg/dL — ABNORMAL HIGH (ref 0.0–149.0)
VLDL: 36.4 mg/dL (ref 0.0–40.0)

## 2020-05-01 LAB — VITAMIN D 25 HYDROXY (VIT D DEFICIENCY, FRACTURES): VITD: 36.02 ng/mL (ref 30.00–100.00)

## 2020-05-01 NOTE — Assessment & Plan Note (Signed)
Continue high-fiber diet 

## 2020-05-01 NOTE — Assessment & Plan Note (Signed)
Doing well off meds She will continue to follow with rheumatology

## 2020-05-01 NOTE — Assessment & Plan Note (Signed)
Encouraged her to continue Calcium and Vitamin D OTC Continue daily weightbearing exercise

## 2020-05-01 NOTE — Patient Instructions (Signed)

## 2020-05-01 NOTE — Progress Notes (Signed)
HPI:  Patient presents the clinic today with for her subsequent annual Medicare wellness exam.  She is also due to follow-up chronic conditions.  Anxiety and Depression: She is not taking any medications for her mood at this time.  She takes ProSom as needed for sleep.  She is not currently seeing a therapist.  She denies SI/HI.  HLD: Her last LDL was 182, 04/2019.  She stopped taking her statin secondary to myalgias.  She is taking Fish Oil OTC.  She tries to consume a low-fat diet.  History of Ovarian Cancer: Status post hysterectomy, chemo and radiation. She has 3 new tumors on her left side. She follows with oncology.  IBS: Mostly constipation.  She is not taking any medications OTC for this but controls this with diet.  RA: She is not taking any medication for this at this time.  She follows with Dr. Ouida Sills.  Osteopenia: She takes Calcium and Vitamin D OTC when she remembers.  She tries to get weightbearing exercise daily.  Bone density from 08/2019 reviewed.  She also reports foul odor to her urine.  She noticed this 1 month ago.  She denies urgency, frequency, dysuria, blood in her urine, pelvic pain, vaginal discharge, vaginal irritation, vaginal odor or abnormal bleeding.  She has not taken anything OTC for her symptoms.  Past Medical History:  Diagnosis Date  . Anxiety and depression    . DJD (degenerative joint disease)   . Elevated cholesterol   . Endometrial polyp   . Family history of bladder cancer   . Family history of lymphoma   . History of radiation therapy 10/25/16-12/01/16   right pelvis/60.2 Gy in 28 fractions  . IRRITABLE BOWEL SYNDROME, HX OF 05/15/2008  . MVP (mitral valve prolapse)    occ palpitations   . Ovarian ca ([place]) 07-2012      . Rheumatoid arthritis(714.0) ~ 2010   dr Ouida Sills  . Shingles     Current Outpatient Medications  Medication Sig Dispense Refill  . cholecalciferol (VITAMIN D3) 25 MCG (1000 UT) tablet Take 1,000 Units by mouth daily.     . fish oil-omega-3 fatty acids 1000 MG capsule Take 1 g by mouth daily.      . Multiple Vitamin (MULTIVITAMIN WITH MINERALS) TABS Take 1 tablet by mouth daily.     No current facility-administered medications for this visit.    Allergies  Allergen Reactions  . Atorvastatin Other (See Comments)    Leg pain  . Macrodantin Other (See Comments)    Unknown Reaction  . Decongest-Aid [Pseudoephedrine] Palpitations    Family History  Problem Relation Age of Onset  . Heart disease Mother   . Bladder Cancer Father 49  . Lymphoma Paternal Aunt   . Osteoporosis Sister   . Colon cancer Neg Hx   . Rectal cancer Neg Hx   . Stomach cancer Neg Hx   . Diabetes Neg Hx   . Stroke Neg Hx     Social History   Socioeconomic History  . Marital status: Married    Spouse name: Not on file  . Number of children: 2  . Years of education: Not on file  . Highest education level: Not on file  Occupational History  . Occupation: disability d/t RA  Tobacco Use  . Smoking status: Never Smoker  . Smokeless tobacco: Never Used  Vaping Use  . Vaping Use: Never used  Substance and Sexual Activity  . Alcohol use: No  . Drug use: No  .  Sexual activity: Never    Birth control/protection: Post-menopausal, Surgical  Other Topics Concern  . Not on file  Social History Narrative   [name] [name] is her daughter    Household-- pr and husband   Social Determinants of Radio broadcast assistant Strain:   . Difficulty of Paying Living Expenses:   Food Insecurity:   . Worried About Charity fundraiser in the Last Year:   . Arboriculturist in the Last Year:   Transportation Needs:   . Film/video editor (Medical):   [name] Lack of Transportation (Non-Medical):   Physical Activity:   . Days of Exercise per Week:   . Minutes of Exercise per Session:   Stress:   . Feeling of Stress :   Social Connections:   . Frequency of Communication with Friends and Family:   . Frequency of Social Gatherings  with Friends and Family:   . Attends Religious Services:   . Active Member of Clubs or Organizations:   . Attends Archivist Meetings:   [name] Marital Status:   Intimate Partner Violence:   . Fear of Current or Ex-Partner:   . Emotionally Abused:   [name] Physically Abused:   . Sexually Abused:     Hospitiliaztions: None  Health Maintenance:    Flu: never  Tetanus: 10/2014  Pneumovax: 10/2014  Prevnar: 03/2016  Zostavax: never  Shingrix: never  Covid: never  Mammogram: 03/2020  Pap Smear: hysterectomy  Bone Density: 08/2019  Colon Screening: 07/2014   Eye Doctor: annually  Dental Exam: as needed    Providers:   PCP: Webb Silversmith, NP  Oncologist:  Dr. Kerry Dory             Rheumatologist: Dr. Ouida Sills    I have personally reviewed and have noted:  1. The patient's medical and social history 2. Their use of alcohol, tobacco or illicit drugs 3. Their current medications and supplements 4. The patient's functional ability including ADL's, fall risks, home safety risks and hearing or visual impairment. 5. Diet and physical activities 6. Evidence for depression or mood disorder  Subjective:   Review of Systems:   Constitutional: Denies fever, malaise, fatigue, headache or abrupt weight changes.  HEENT: Denies eye pain, eye redness, ear pain, ringing in the ears, wax buildup, runny nose, nasal congestion, bloody nose, or sore throat. Respiratory: Denies difficulty breathing, shortness of breath, cough or sputum production.   Cardiovascular: Denies chest pain, chest tightness, palpitations or swelling in the hands or feet.  Gastrointestinal: Patient has a history of constipation denies abdominal pain, bloating, diarrhea or blood in the stool.  GU: Patient reports foul urine.  Denies urgency, frequency, pain with urination, burning sensation, blood in urine, or discharge. Musculoskeletal: Denies decrease in range of motion, difficulty with gait, muscle pain or joint pain and  swelling.  Skin: Denies redness, rashes, lesions or ulcercations.  Neurological: Denies dizziness, difficulty with memory, difficulty with speech or problems with balance and coordination.  Psych: Patient has a history of anxiety and depression.  Denies SI/HI.  No other specific complaints in a complete review of systems (except as listed in HPI above).  Objective:  PE:   BP 124/76   Pulse 81   Temp 98.3 F (36.8 C) (Temporal)   Ht 5' 3.5" (1.613 m)   Wt 147 lb (66.7 kg)   SpO2 97%   BMI 25.63 kg/m   Wt Readings from Last 3 Encounters:  03/21/20 148 lb 6  oz (67.3 kg)  03/17/20 146 lb 12.8 oz (66.6 kg)  10/16/19 146 lb 12.8 oz (66.6 kg)    General: Appears her stated age, well developed, well nourished in NAD. Skin: Warm, dry and intact. No rashes noted. HEENT: Head: normal shape and size; Eyes: sclera white, no icterus, conjunctiva pink, PERRLA and EOMs intact;  Neck: Neck supple, trachea midline. No masses, lumps or thyromegaly present.  Cardiovascular: Normal rate and rhythm. S1,S2 noted.  No murmur, rubs or gallops noted. No JVD or BLE edema. No carotid bruits noted. Pulmonary/Chest: Normal effort and positive vesicular breath sounds. No respiratory distress. No wheezes, rales or ronchi noted.  Abdomen: Soft and nontender. Normal bowel sounds. No distention or masses noted. Liver, spleen and kidneys non palpable. Musculoskeletal: Strength 5/5 BUE/BLE. No difficulty with gait. Neurological: Alert and oriented. Cranial nerves II-XII grossly intact. Coordination normal.  Psychiatric: Mood and affect normal. Behavior is normal. Judgment and thought content normal.     BMET    Component Value Date/Time   NA 138 03/13/2020 1451   NA 142 09/20/2017 0940   K 4.1 03/13/2020 1451   K 4.7 09/20/2017 0940   CL 104 03/13/2020 1451   CL 104 01/02/2013 0821   CO2 27 03/13/2020 1451   CO2 28 09/20/2017 0940   GLUCOSE 89 03/13/2020 1451   GLUCOSE 91 09/20/2017 0940   GLUCOSE 86  01/02/2013 0821   BUN 22 03/13/2020 1451   BUN 14.2 09/20/2017 0940   CREATININE 0.84 03/13/2020 1451   CREATININE 0.9 09/20/2017 0940   CALCIUM 9.5 03/13/2020 1451   CALCIUM 9.8 09/20/2017 0940   GFRNONAA >60 03/13/2020 1451   GFRAA >60 03/13/2020 1451    Lipid Panel     Component Value Date/Time   CHOL 265 (H) 04/30/2019 1224   TRIG 133.0 04/30/2019 1224   HDL 56.60 04/30/2019 1224   CHOLHDL 5 04/30/2019 1224   VLDL 26.6 04/30/2019 1224   LDLCALC 182 (H) 04/30/2019 1224    CBC    Component Value Date/Time   WBC 7.0 08/01/2019 2256   RBC 4.10 08/01/2019 2256   HGB 13.4 08/01/2019 2256   HGB 12.8 03/26/2013 0842   HCT 42.1 08/01/2019 2256   HCT 37.4 03/26/2013 0842   PLT 244 08/01/2019 2256   PLT 194 03/26/2013 0842   MCV 102.7 (H) 08/01/2019 2256   MCV 111.1 (H) 03/26/2013 0842   MCH 32.7 08/01/2019 2256   MCHC 31.8 08/01/2019 2256   RDW 14.0 08/01/2019 2256   RDW 14.5 03/26/2013 0842   LYMPHSABS 1.5 10/18/2014 1024   LYMPHSABS 1.0 03/26/2013 0842   MONOABS 0.6 10/18/2014 1024   MONOABS 0.5 03/26/2013 0842   EOSABS 0.1 10/18/2014 1024   EOSABS 0.1 03/26/2013 0842   BASOSABS 0.0 10/18/2014 1024   BASOSABS 0.0 03/26/2013 0842    Hgb A1C No results found for: HGBA1C    Assessment and Plan:   Medicare Annual Wellness Visit:  Diet: She does eat meat. She consumes fruits and veggies daily. She does eat some fried foods. She drinks mostly water, juice and gatorade. Physical activity: Working in the yard Depression/mood screen: Negative, PHQ 9 score of 0 Hearing: Intact to whispered voice Visual acuity: Grossly normal, performs annual eye exam  ADLs: Capable Fall risk: None Home safety: Good Cognitive evaluation: Some difficulty with recall.  Intact to orientation, naming, recall and repetition EOL planning: No adv directives, full code/ I agree  Preventative Medicine: Encouraged her to get a flu shot the  fall.  Tetanus, Pneumovax, Prevnar UTD.  She  declines a Zostavax, Shingrix or Covid at this time.  She no longer needs cervical cancer screening.  Mammogram UTD.  Bone density UTD.  Colon screening UTD.  Encouraged her to consume a balanced diet and exercise regimen.  Advised her to see an eye doctor and dentist annually.  Will check CBC, C met, lipid and vitamin D today.  Due dates for screening exam given to patient as part of her AVS.  Urine Odor:  Urinalysis normal Push fluids  Next appointment: 1 year, Medicare wellness exam   Webb Silversmith, NP This visit occurred during the SARS-CoV-2 public health emergency.  Safety protocols were in place, including screening questions prior to the visit, additional usage of staff PPE, and extensive cleaning of exam room while observing appropriate contact time as indicated for disinfecting solutions.

## 2020-05-01 NOTE — Assessment & Plan Note (Signed)
Stable off meds We will monitor 

## 2020-05-01 NOTE — Assessment & Plan Note (Signed)
C met and lipid profile today Encouraged her to consume a low-fat diet Continue Fish Oil OTC Consider Red Yeast Rice if statin intolerance

## 2020-05-01 NOTE — Assessment & Plan Note (Signed)
She will continue to follow with oncology. 

## 2020-05-08 NOTE — Addendum Note (Signed)
Addended by: Lurlean Nanny on: 05/08/2020 12:03 PM   Modules accepted: Orders

## 2020-05-19 DIAGNOSIS — H2513 Age-related nuclear cataract, bilateral: Secondary | ICD-10-CM | POA: Diagnosis not present

## 2020-05-19 DIAGNOSIS — H524 Presbyopia: Secondary | ICD-10-CM | POA: Diagnosis not present

## 2020-05-19 DIAGNOSIS — H5203 Hypermetropia, bilateral: Secondary | ICD-10-CM | POA: Diagnosis not present

## 2020-05-21 ENCOUNTER — Telehealth: Payer: Self-pay | Admitting: Nurse Practitioner

## 2020-05-21 NOTE — Telephone Encounter (Signed)
Rescheduled appointments per 8/2 provider message. Left message for patient with appointment date and time.

## 2020-06-23 ENCOUNTER — Ambulatory Visit (INDEPENDENT_AMBULATORY_CARE_PROVIDER_SITE_OTHER): Payer: Medicare Other | Admitting: Otolaryngology

## 2020-06-23 ENCOUNTER — Encounter (INDEPENDENT_AMBULATORY_CARE_PROVIDER_SITE_OTHER): Payer: Self-pay | Admitting: Otolaryngology

## 2020-06-23 ENCOUNTER — Other Ambulatory Visit: Payer: Self-pay

## 2020-06-23 VITALS — Temp 97.5°F

## 2020-06-23 DIAGNOSIS — H6123 Impacted cerumen, bilateral: Secondary | ICD-10-CM | POA: Diagnosis not present

## 2020-06-23 NOTE — Progress Notes (Signed)
HPI: [name] [name] is a 69 y.o. female who presents for evaluation of wax buildup in her ears referred by [facility].  She was supposed to get a hearing test but needed the wax cleaned first..  Past Medical History:  Diagnosis Date  . Anxiety and depression    . DJD (degenerative joint disease)   . Elevated cholesterol   . Endometrial polyp   . Family history of bladder cancer   . Family history of lymphoma   . History of radiation therapy 10/25/16-12/01/16   right pelvis/60.2 Gy in 28 fractions  . IRRITABLE BOWEL SYNDROME, HX OF 05/15/2008  . MVP (mitral valve prolapse)    occ palpitations   . Ovarian ca ([place]) 07-2012      . Rheumatoid arthritis(714.0) ~ 2010   dr Ouida Sills  . Shingles    Past Surgical History:  Procedure Laterality Date  . ABDOMINAL HYSTERECTOMY  08/22/2012   Procedure: HYSTERECTOMY ABDOMINAL;  Surgeon: Alvino Chapel, MD;  Location: [facility];  Service: Gynecology;  Laterality: N/A;  . BACK SURGERY  2008   Dr Trenton Gammon  . COLONOSCOPY  04-20-2004  . COLONOSCOPY W/ BIOPSIES  07/2014  . COLOSTOMY REVISION  08/22/2012   Procedure: COLON RESECTION SIGMOID;  Surgeon: Alvino Chapel, MD;  Location: [facility];  Service: Gynecology;;  Rectal Sigmoid resection and low rectal anastomosis  . CYST ON NECK  2011  . DEBULKING  08/22/2012   Procedure: DEBULKING;  Surgeon: Alvino Chapel, MD;  Location: [facility];  Service: Gynecology;  Laterality: N/A;  Radical tumor debulking, Bilateral Ureterolysis  . DILATION AND CURETTAGE OF UTERUS  2004   WITH HYSTEROSCOPY  . HAND SURGERY  1996  . HYSTEROSCOPY  2004   D&C  . NECK SURGERY  2009   [facility]  . OMENTECTOMY  08/22/2012   Procedure: OMENTECTOMY;  Surgeon: Alvino Chapel, MD;  Location: [facility];  Service: Gynecology;  Laterality: N/A;  . SALPINGOOPHORECTOMY  08/22/2012   Procedure: SALPINGO OOPHORECTOMY;  Surgeon: Alvino Chapel, MD;  Location: [facility];  Service: Gynecology;  Laterality:  Bilateral;  . TUBAL LIGATION  1980   Social History   Socioeconomic History  . Marital status: Married    Spouse name: Not on file  . Number of children: 2  . Years of education: Not on file  . Highest education level: Not on file  Occupational History  . Occupation: disability d/t RA  Tobacco Use  . Smoking status: Never Smoker  . Smokeless tobacco: Never Used  Vaping Use  . Vaping Use: Never used  Substance and Sexual Activity  . Alcohol use: No  . Drug use: No  . Sexual activity: Never    Birth control/protection: Post-menopausal, Surgical  Other Topics Concern  . Not on file  Social History Narrative   [name] is her daughter    Household-- pr and husband   Social Determinants of Health   Financial Resource Strain:   . Difficulty of Paying Living Expenses: Not on file  Food Insecurity:   . Worried About Charity fundraiser in the Last Year: Not on file  . Ran Out of Food in the Last Year: Not on file  Transportation Needs:   . Lack of Transportation (Medical): Not on file  . Lack of Transportation (Non-Medical): Not on file  Physical Activity:   . Days of Exercise per Week: Not on file  . Minutes of Exercise per Session: Not on file  Stress:   .  Feeling of Stress : Not on file  Social Connections:   . Frequency of Communication with Friends and Family: Not on file  . Frequency of Social Gatherings with Friends and Family: Not on file  . Attends Religious Services: Not on file  . Active Member of Clubs or Organizations: Not on file  . Attends Archivist Meetings: Not on file  . Marital Status: Not on file   Family History  Problem Relation Age of Onset  . Heart disease Mother   . Bladder Cancer Father 73  . Lymphoma Paternal Aunt   . Osteoporosis Sister   . Colon cancer Neg Hx   . Rectal cancer Neg Hx   . Stomach cancer Neg Hx   . Diabetes Neg Hx   . Stroke Neg Hx    Allergies  Allergen Reactions  . Atorvastatin Other (See  Comments)    Leg pain  . Macrodantin Other (See Comments)    Unknown Reaction  . Decongest-Aid [Pseudoephedrine] Palpitations   Prior to Admission medications   Medication Sig Start Date End Date Taking? Authorizing Provider  cholecalciferol (VITAMIN D3) 25 MCG (1000 UT) tablet Take 1,000 Units by mouth daily.   Yes [provider]  fish oil-omega-3 fatty acids 1000 MG capsule Take 1 g by mouth daily.     Yes [provider]  Multiple Vitamin (MULTIVITAMIN WITH MINERALS) TABS Take 1 tablet by mouth daily.   Yes [provider]     Positive ROS: Otherwise negative  All other systems have been reviewed and were otherwise negative with the exception of those mentioned in the HPI and as above.  Physical Exam: Constitutional: Alert, well-appearing, no acute distress Ears: External ears without lesions or tenderness. Ear canals with a mild amount of wax in both ear canals that was cleaned with forceps suction and curettes.  TMs were clear bilaterally.. Nasal: External nose without lesions. Clear nasal passages Oral: Oropharynx clear. Neck: No palpable adenopathy or masses Respiratory: Breathing comfortably  Skin: No facial/neck lesions or rash noted.  Cerumen impaction removal  Date/Time: 06/23/2020 4:01 PM Performed by: Rozetta Nunnery, MD Authorized by: Rozetta Nunnery, MD   Consent:    Consent obtained:  Verbal   Consent given by:  Patient   Risks discussed:  Pain and bleeding Procedure details:    Location:  L ear and R ear   Procedure type: curette, suction and forceps   Post-procedure details:    Inspection:  TM intact and canal normal   Hearing quality:  Improved   Patient tolerance of procedure:  Tolerated well, no immediate complications Comments:     TMs are clear bilaterally    Assessment: Bilateral cerumen buildup  Plan: This was cleaned in the office. She will follow-up as needed.  Radene Journey, MD

## 2020-06-27 ENCOUNTER — Inpatient Hospital Stay: Payer: Medicare Other

## 2020-06-27 ENCOUNTER — Other Ambulatory Visit: Payer: Self-pay

## 2020-06-27 ENCOUNTER — Encounter: Payer: Self-pay | Admitting: Gynecologic Oncology

## 2020-06-27 ENCOUNTER — Inpatient Hospital Stay: Payer: Medicare Other | Attending: Gynecologic Oncology | Admitting: Gynecologic Oncology

## 2020-06-27 VITALS — BP 121/77 | HR 89 | Temp 97.0°F | Resp 18 | Wt 150.0 lb

## 2020-06-27 DIAGNOSIS — I700 Atherosclerosis of aorta: Secondary | ICD-10-CM | POA: Diagnosis not present

## 2020-06-27 DIAGNOSIS — E7800 Pure hypercholesterolemia, unspecified: Secondary | ICD-10-CM | POA: Insufficient documentation

## 2020-06-27 DIAGNOSIS — M069 Rheumatoid arthritis, unspecified: Secondary | ICD-10-CM | POA: Insufficient documentation

## 2020-06-27 DIAGNOSIS — N816 Rectocele: Secondary | ICD-10-CM | POA: Insufficient documentation

## 2020-06-27 DIAGNOSIS — Z9221 Personal history of antineoplastic chemotherapy: Secondary | ICD-10-CM | POA: Insufficient documentation

## 2020-06-27 DIAGNOSIS — Z90710 Acquired absence of both cervix and uterus: Secondary | ICD-10-CM | POA: Diagnosis not present

## 2020-06-27 DIAGNOSIS — I341 Nonrheumatic mitral (valve) prolapse: Secondary | ICD-10-CM | POA: Insufficient documentation

## 2020-06-27 DIAGNOSIS — C569 Malignant neoplasm of unspecified ovary: Secondary | ICD-10-CM

## 2020-06-27 DIAGNOSIS — Z90722 Acquired absence of ovaries, bilateral: Secondary | ICD-10-CM

## 2020-06-27 DIAGNOSIS — Z8543 Personal history of malignant neoplasm of ovary: Secondary | ICD-10-CM | POA: Diagnosis not present

## 2020-06-27 DIAGNOSIS — F329 Major depressive disorder, single episode, unspecified: Secondary | ICD-10-CM | POA: Insufficient documentation

## 2020-06-27 DIAGNOSIS — Z923 Personal history of irradiation: Secondary | ICD-10-CM | POA: Insufficient documentation

## 2020-06-27 DIAGNOSIS — Z9079 Acquired absence of other genital organ(s): Secondary | ICD-10-CM

## 2020-06-27 DIAGNOSIS — F419 Anxiety disorder, unspecified: Secondary | ICD-10-CM | POA: Insufficient documentation

## 2020-06-27 DIAGNOSIS — K589 Irritable bowel syndrome without diarrhea: Secondary | ICD-10-CM | POA: Insufficient documentation

## 2020-06-27 DIAGNOSIS — R351 Nocturia: Secondary | ICD-10-CM | POA: Diagnosis not present

## 2020-06-27 DIAGNOSIS — Z08 Encounter for follow-up examination after completed treatment for malignant neoplasm: Secondary | ICD-10-CM | POA: Diagnosis not present

## 2020-06-27 NOTE — Patient Instructions (Signed)
Everything on your exam is normal today!  I will call you with your Ca1 25 results next week once I have them.  I will see you 3 months after you see Dr. Benay Spice.  Please call my office in December to get a visit scheduled in March.  If you develop any new or concerning symptoms, such as abdominal bloating, pain, change to your bowel function, please call the clinic to be seen sooner.

## 2020-06-27 NOTE — Progress Notes (Signed)
Gynecologic Oncology Return Clinic Visit  06/27/20  Reason for Visit: surveillance in the setting of recurrent ovarian cancer  Treatment History: Oncology History Overview Note  CA-125 10/10/113: 68 02/2013: 5.6, post adjuvant chemotherapy (03/2013-02/2016): 3.2-8.8 08/2016: 10.2, just prior to recurrence diagnosis 02/14/17: 7.1, 3 months post completion of RT 09/17/19: 6.5  In 2014, presented with pelvic pain and pressure, urinary frequency.  In 08/2016, presented with 3 months of constipation and RLQ pain. On exam, had some fullness and tenderness along right pelvic sidewall.    Ovarian cancer ([place])  07/12/2012 Imaging   PET: significant soft tissue thickening seen throughout the greater omentum suspicious for intraperitoneal carcinoma. There is minimal ascites in the pelvis. it revealed the areas of omental and peritoneal nodularity were hypermetabolic. There is a dominant pelvic omental mass measuring 10.4 x 3.3 cm. There is a small omental implants in the left side of the abdomen measuring 1.1 cm. There were multiple pelvic foci That wereperitoneal based And hypermetabolic. These included the cul-de-sac. She has small volume of ascites.    07/24/2012 Initial Biopsy   CT-guided revealed metastatic carcinoma. By [facility], malignant cells were positive for WT 1, cytokeratin 7, estrogen receptor. There were negative for TTF-1, progesterone receptor, cytokeratin 20, CEA, CD X2, and S100. Overall, the histologic features were highly suggestive of a primary gynecologic origin.   08/22/2012 Surgery   Radical debulking - TAH/BSO, ureterolysis, omentectomy, stripping of pelvic peritoneum, omentectomy, and rectosigmoid resection with low rectal reanastomosis Findings: stage IIIC ovarian cancer. Patient was optimally debulked. Debulking, however, required a rectosigmoid resection as well as total omentectomy total nominal hysterectomy bilateral salpingo-oophorectomy. She was optimally debulked with only  5 mm nodules on the right diaphragm as residual disease.    08/23/2012 Initial Diagnosis   Ovarian cancer ([name])   09/2012 - 02/06/2013 Chemotherapy   6 cycles of carboplatin/taxol    03/29/2013 Imaging   CT C/A/P: 1.  Marked response to therapy of omental/peritoneal disease.  No residual measurable disease identified. 2.  Interval hysterectomy and bilateral oophrectomy.   08/25/2016 Imaging   CT A/P: 1. Solitary new enlarged right external iliac lymph node, suspicious for metastatic nodal recurrence. 2. No additional sites of metastatic disease in the abdomen or pelvis. 3. Aortic atherosclerosis.   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 There is malignant range FDG uptake associated with the enlarged right external iliac lymph node compatible with metastatic adenopathy. 2. No additional sites of metastatic disease identified. 3. Aortic atherosclerosis.   10/25/2016 - 12/01/2016 Radiation Therapy   Radiation to right pelvis treated to 60.2Gy in 28 fractions   03/28/2017 Imaging   PET post RT: 1. The previously enlarged and hypermetabolic right external iliac lymph node is currently of normal size and the previous hypermetabolic activity has resolved. No current active malignancy identified. 2. 2 mm right mid kidney nonobstructive renal calculus. 3.  Aortic Atherosclerosis (ICD10-I70.0).   09/23/2017 Imaging   CT A/P: No evidence of bowel obstruction.  Normal appendix. No CT findings to account for the patient's worsening right lower quadrant abdominal pain. No evidence of recurrent or metastatic disease.   10/31/2017 Imaging   PET: No evidence local ovarian carcinoma recurrence in the pelvis. No metastatic adenopathy or distant metastatic disease.   05/15/2018 Imaging   PET: 1. No evidence recurrent  or metastatic disease. 2.  Aortic atherosclerosis (ICD10-170.0).   09/19/2019 Imaging   CT A/P: Mild increase in size of several left upper quadrant peritoneal nodules measuring up to 11 mm, highly suspicious for increased peritoneal carcinomatosis. No other sites of metastatic disease identified. No evidence of ascites.   11/27/2019 Genetic Testing   CHEK2 p.N405K VUS found on the cancerNext HRD testing.  The CancerNext gene panel offered by [employer] includes sequencing and rearrangement analysis for the following 34 genes:   APC, ATM, BARD1, BMPR1A, BRCA1, BRCA2, BRIP1, CDH1, CDK4, CDKN2A, CHEK2, DICER1, HOXB13, EPCAM, GREM1, MLH1, MRE11A, MSH2, MSH6, MUTYH, NBN, NF1, PALB2, PMS2, POLD1, POLE, PTEN, RAD50, RAD51C, RAD51D, SMAD4, SMARCA4, STK11, and TP53.  The report date is November 27, 2019.  HRD testing did not identify any pathogenic variants in the tumor.  Therefore HRD is neg.     Interval History: Patient is doing very well since I last saw her.  She has been out doing yard work and gardening and enjoying the cooler temperatures in the sun.  She continues to have very occasional right pelvic pain, no change.  She endorses a good appetite, no weight change, no bloating or early satiety, no nausea or emesis.  She has some nocturia, voiding 2-3 times in night.  She denies any vaginal bleeding or discharge.  In terms of her bowel function, she describes this as normal although on further questioning sometimes has to splint to have a bowel movement.  Past Medical/Surgical History: Past Medical History:  Diagnosis Date  . Anxiety and depression    . DJD (degenerative joint disease)   . Elevated cholesterol   . Endometrial polyp   . Family history of bladder cancer   . Family history of lymphoma   . History of radiation therapy 10/25/16-12/01/16   right pelvis/60.2 Gy in 28 fractions  . IRRITABLE BOWEL SYNDROME, HX OF 05/15/2008  . MVP (mitral valve prolapse)    occ  palpitations   . Ovarian ca ([name]) 07-2012      . Rheumatoid arthritis(714.0) ~ 2010   dr [name] [name]  . Shingles     Past Surgical History:  Procedure Laterality Date  . ABDOMINAL HYSTERECTOMY  08/22/2012   Procedure: HYSTERECTOMY ABDOMINAL;  Surgeon: Alvino Chapel, MD;  Location: [facility];  Service: Gynecology;  Laterality: N/A;  . BACK SURGERY  2008   Dr Trenton Gammon  . COLONOSCOPY  04-20-2004  . COLONOSCOPY W/ BIOPSIES  07/2014  . COLOSTOMY REVISION  08/22/2012   Procedure: COLON RESECTION SIGMOID;  Surgeon: Alvino Chapel, MD;  Location: [facility];  Service: Gynecology;;  Rectal Sigmoid resection and low rectal anastomosis  . CYST ON NECK  2011  . DEBULKING  08/22/2012   Procedure: DEBULKING;  Surgeon: Alvino Chapel, MD;  Location: [facility];  Service: Gynecology;  Laterality: N/A;  Radical tumor debulking, Bilateral Ureterolysis  . DILATION AND CURETTAGE OF UTERUS  2004   WITH HYSTEROSCOPY  . HAND SURGERY  1996  . HYSTEROSCOPY  2004   D&C  . NECK SURGERY  2009   SPURS  . OMENTECTOMY  08/22/2012   Procedure: OMENTECTOMY;  Surgeon: Alvino Chapel, MD;  Location: [facility];  Service: Gynecology;  Laterality: N/A;  . SALPINGOOPHORECTOMY  08/22/2012   Procedure: SALPINGO OOPHORECTOMY;  Surgeon: Alvino Chapel, MD;  Location: [facility];  Service: Gynecology;  Laterality: Bilateral;  . TUBAL LIGATION  1980    Family History  Problem Relation Age of Onset  . Heart disease Mother   . Bladder  Cancer Father 41  . Lymphoma Paternal Aunt   . Osteoporosis Sister   . Colon cancer Neg Hx   . Rectal cancer Neg Hx   . Stomach cancer Neg Hx   . Diabetes Neg Hx   . Stroke Neg Hx     Social History   Socioeconomic History  . Marital status: Married    Spouse name: Not on file  . Number of children: 2  . Years of education: Not on file  . Highest education level: Not on file  Occupational History  . Occupation: disability d/t RA  Tobacco Use  . Smoking  status: Never Smoker  . Smokeless tobacco: Never Used  Vaping Use  . Vaping Use: Never used  Substance and Sexual Activity  . Alcohol use: No  . Drug use: No  . Sexual activity: Never    Birth control/protection: Post-menopausal, Surgical  Other Topics Concern  . Not on file  Social History Narrative   [name] is her daughter    Household-- pr and husband   Social Determinants of Health   Financial Resource Strain:   . Difficulty of Paying Living Expenses: Not on file  Food Insecurity:   . Worried About Charity fundraiser in the Last Year: Not on file  . Ran Out of Food in the Last Year: Not on file  Transportation Needs:   . Lack of Transportation (Medical): Not on file  . Lack of Transportation (Non-Medical): Not on file  Physical Activity:   . Days of Exercise per Week: Not on file  . Minutes of Exercise per Session: Not on file  Stress:   . Feeling of Stress : Not on file  Social Connections:   . Frequency of Communication with Friends and Family: Not on file  . Frequency of Social Gatherings with Friends and Family: Not on file  . Attends Religious Services: Not on file  . Active Member of Clubs or Organizations: Not on file  . Attends Archivist Meetings: Not on file  . Marital Status: Not on file    Current Medications:  Current Outpatient Medications:  .  cholecalciferol (VITAMIN D3) 25 MCG (1000 UT) tablet, Take 1,000 Units by mouth daily., Disp: , Rfl:  .  fish oil-omega-3 fatty acids 1000 MG capsule, Take 1 g by mouth daily.  , Disp: , Rfl:  .  Multiple Vitamin (MULTIVITAMIN WITH MINERALS) TABS, Take 1 tablet by mouth daily., Disp: , Rfl:   Review of Systems: Reports hearing loss, ringing in her ears, joint pain, back pain. Denies appetite changes, fevers, chills, fatigue, unexplained weight changes. Denies neck lumps or masses, mouth sores, or voice changes. Denies cough or wheezing.  Denies shortness of breath. Denies chest pain or  palpitations. Denies leg swelling. Denies abdominal distention, pain, blood in stools, constipation, diarrhea, nausea, vomiting, or early satiety. Denies pain with intercourse, dysuria, frequency, hematuria or incontinence. Denies hot flashes, pelvic pain, vaginal bleeding or vaginal discharge.   Denies muscle pain/cramps. Denies itching, rash, or wounds. Denies dizziness, headaches, numbness or seizures. Denies swollen lymph nodes or glands, denies easy bruising or bleeding. Denies anxiety, depression, confusion, or decreased concentration.  Physical Exam: BP 121/77 (BP Location: Left Arm, Patient Position: Sitting)   Pulse 89   Temp (!) 97 F (36.1 C) (Tympanic)   Resp 18   Wt 150 lb (68 kg)   SpO2 96%   BMI 26.15 kg/m  General: Alert, oriented, no acute distress. HEENT: Normocephalic, atraumatic,  sclera anicteric. Chest: Unlabored breathing on room air. Abdomen: soft, nontender.  Normoactive bowel sounds.  No masses or hepatosplenomegaly appreciated.  Well-healed scar. Extremities: Grossly normal range of motion.  4 cm bruise on inner right upper leg.  Warm, well perfused.  No edema bilaterally. Skin: No rashes or lesions noted. Lymphatics: No cervical, supraclavicular, or inguinal adenopathy. GU: Normal appearing external genitalia without erythema, excoriation, or lesions.  Speculum exam reveals prominent rectocele, noted at the introitus with separation of the labia.  Vagina mildly atrophic, no lesions or masses.  Bimanual exam reveals cuff intact, no masses.  Rectovaginal exam confirms these findings, no nodularity noted.  Laboratory & Radiologic Studies: None new  Assessment & Plan: [name] [name] is a 68 y.o. woman with history of HGS ovarian cancer, treated initially in 2014 with optimal cytoreduction and adjuvant platinum-based chemotherapy, treated for localized right pelvic LN recurrence in 2018 with RT, with imaging findings of indolent recurrent disease.  Patient is  overall doing well without symptoms related to cancer.  She is NED on exam today.  We will get a CA-125 and call her with these results next week.  Patient is scheduled to see Dr. Benay Spice in December.  Plan is for repeat CT scan at that time to assess left upper abdominal lesions.  In terms of her rectocele, upon further discussion, the patient is mildly symptomatic but not bothered by her symptoms.  We discussed treatment options and referral to a urogynecologist.  The patient prefers to hold off on any referral at this time but will let me know if she becomes more symptomatic.  She is already realized that dietary changes and making sure her stool does not get constipated has helped with her symptoms.  Patient will continue to have versus visits every 3 months, alternating between myself and Dr. Benay Spice.  She is scheduled with him in December and knows to call my clinic in December to get an appointment scheduled with me in March.  We discussed signs and symptoms that would be concerning for growing disease burden. I have asked her to call me if she develops any new or worsening symptoms before her next visit with me.  25 minutes of total time was spent for this patient encounter, including preparation, face-to-face counseling with the patient and coordination of care, and documentation of the encounter.  Jeral Pinch, MD  Division of Gynecologic Oncology  Department of Obstetrics and Gynecology  [facility]

## 2020-06-28 LAB — CA 125: Cancer Antigen (CA) 125: 9.3 U/mL (ref 0.0–38.1)

## 2020-06-30 ENCOUNTER — Telehealth: Payer: Self-pay

## 2020-06-30 NOTE — Telephone Encounter (Signed)
TC to patient to report normal Ca125 (9.3) results.  Patient pleased with results.  Patient will call with any questions or concerns.

## 2020-07-21 DIAGNOSIS — Z20822 Contact with and (suspected) exposure to covid-19: Secondary | ICD-10-CM | POA: Diagnosis not present

## 2020-08-12 ENCOUNTER — Other Ambulatory Visit (INDEPENDENT_AMBULATORY_CARE_PROVIDER_SITE_OTHER): Payer: Medicare Other

## 2020-08-12 ENCOUNTER — Other Ambulatory Visit: Payer: Self-pay

## 2020-08-12 DIAGNOSIS — E7800 Pure hypercholesterolemia, unspecified: Secondary | ICD-10-CM | POA: Diagnosis not present

## 2020-08-12 LAB — LIPID PANEL
Cholesterol: 267 mg/dL — ABNORMAL HIGH (ref 0–200)
HDL: 51.3 mg/dL (ref >39.00)
LDL Cholesterol: 178 mg/dL — ABNORMAL HIGH (ref 0–99)
NonHDL: 216.12
Total CHOL/HDL Ratio: 5
Triglycerides: 191 mg/dL — ABNORMAL HIGH (ref 0.0–149.0)
VLDL: 38.2 mg/dL (ref 0.0–40.0)

## 2020-09-06 DIAGNOSIS — Z20822 Contact with and (suspected) exposure to covid-19: Secondary | ICD-10-CM | POA: Diagnosis not present

## 2020-09-11 ENCOUNTER — Other Ambulatory Visit: Payer: Self-pay

## 2020-09-11 ENCOUNTER — Inpatient Hospital Stay: Payer: Medicare Other | Attending: Gynecologic Oncology

## 2020-09-11 DIAGNOSIS — C786 Secondary malignant neoplasm of retroperitoneum and peritoneum: Secondary | ICD-10-CM | POA: Diagnosis not present

## 2020-09-11 DIAGNOSIS — M542 Cervicalgia: Secondary | ICD-10-CM | POA: Insufficient documentation

## 2020-09-11 DIAGNOSIS — C569 Malignant neoplasm of unspecified ovary: Secondary | ICD-10-CM

## 2020-09-11 DIAGNOSIS — C541 Malignant neoplasm of endometrium: Secondary | ICD-10-CM | POA: Insufficient documentation

## 2020-09-11 DIAGNOSIS — D701 Agranulocytosis secondary to cancer chemotherapy: Secondary | ICD-10-CM | POA: Insufficient documentation

## 2020-09-11 DIAGNOSIS — M545 Low back pain, unspecified: Secondary | ICD-10-CM | POA: Diagnosis not present

## 2020-09-11 DIAGNOSIS — Z90722 Acquired absence of ovaries, bilateral: Secondary | ICD-10-CM | POA: Insufficient documentation

## 2020-09-11 DIAGNOSIS — G8929 Other chronic pain: Secondary | ICD-10-CM | POA: Insufficient documentation

## 2020-09-11 DIAGNOSIS — Z90710 Acquired absence of both cervix and uterus: Secondary | ICD-10-CM | POA: Insufficient documentation

## 2020-09-11 DIAGNOSIS — F419 Anxiety disorder, unspecified: Secondary | ICD-10-CM | POA: Diagnosis not present

## 2020-09-11 DIAGNOSIS — T451X5A Adverse effect of antineoplastic and immunosuppressive drugs, initial encounter: Secondary | ICD-10-CM | POA: Diagnosis not present

## 2020-09-11 LAB — BASIC METABOLIC PANEL - CANCER CENTER ONLY
Anion gap: 9 (ref 5–15)
BUN: 24 mg/dL — ABNORMAL HIGH (ref 8–23)
CO2: 27 mmol/L (ref 22–32)
Calcium: 9.7 mg/dL (ref 8.9–10.3)
Chloride: 105 mmol/L (ref 98–111)
Creatinine: 0.83 mg/dL (ref 0.44–1.00)
GFR, Estimated: >60 mL/min (ref >60)
Glucose, Bld: 87 mg/dL (ref 70–99)
Potassium: 4.5 mmol/L (ref 3.5–5.1)
Sodium: 141 mmol/L (ref 135–145)

## 2020-09-12 ENCOUNTER — Ambulatory Visit (HOSPITAL_COMMUNITY)
Admission: RE | Admit: 2020-09-12 | Discharge: 2020-09-12 | Disposition: A | Discharge status: Home / Self Care | Payer: Medicare Other | Source: Ambulatory Visit | Attending: Oncology | Admitting: Oncology

## 2020-09-12 DIAGNOSIS — C569 Malignant neoplasm of unspecified ovary: Secondary | ICD-10-CM | POA: Diagnosis not present

## 2020-09-12 DIAGNOSIS — K661 Hemoperitoneum: Secondary | ICD-10-CM | POA: Diagnosis not present

## 2020-09-12 DIAGNOSIS — K7689 Other specified diseases of liver: Secondary | ICD-10-CM | POA: Diagnosis not present

## 2020-09-12 DIAGNOSIS — K669 Disorder of peritoneum, unspecified: Secondary | ICD-10-CM | POA: Insufficient documentation

## 2020-09-12 DIAGNOSIS — K449 Diaphragmatic hernia without obstruction or gangrene: Secondary | ICD-10-CM | POA: Diagnosis not present

## 2020-09-12 LAB — CA 125: Cancer Antigen (CA) 125: 17.5 U/mL (ref 0.0–38.1)

## 2020-09-12 MED ORDER — SODIUM CHLORIDE (PF) 0.9 % IJ SOLN
INTRAMUSCULAR | Status: AC
  Filled 2020-09-12: qty 50

## 2020-09-12 MED ORDER — IOHEXOL 300 MG/ML  SOLN
100.0000 mL | Freq: Once | INTRAMUSCULAR | Status: AC | PRN
  Administered 2020-09-12: 100 mL via INTRAVENOUS

## 2020-09-15 ENCOUNTER — Other Ambulatory Visit: Payer: Medicare Other

## 2020-09-16 ENCOUNTER — Other Ambulatory Visit: Payer: Self-pay

## 2020-09-16 ENCOUNTER — Encounter: Payer: Self-pay | Admitting: Nurse Practitioner

## 2020-09-16 ENCOUNTER — Inpatient Hospital Stay (HOSPITAL_BASED_OUTPATIENT_CLINIC_OR_DEPARTMENT_OTHER): Payer: Medicare Other | Admitting: Nurse Practitioner

## 2020-09-16 ENCOUNTER — Ambulatory Visit: Payer: Medicare Other | Admitting: Nurse Practitioner

## 2020-09-16 VITALS — BP 115/65 | HR 86 | Temp 97.0°F | Resp 17 | Ht 63.5 in | Wt 152.2 lb

## 2020-09-16 DIAGNOSIS — C569 Malignant neoplasm of unspecified ovary: Secondary | ICD-10-CM

## 2020-09-16 DIAGNOSIS — Z90722 Acquired absence of ovaries, bilateral: Secondary | ICD-10-CM | POA: Diagnosis not present

## 2020-09-16 DIAGNOSIS — C541 Malignant neoplasm of endometrium: Secondary | ICD-10-CM | POA: Diagnosis not present

## 2020-09-16 DIAGNOSIS — D701 Agranulocytosis secondary to cancer chemotherapy: Secondary | ICD-10-CM | POA: Diagnosis not present

## 2020-09-16 DIAGNOSIS — Z90710 Acquired absence of both cervix and uterus: Secondary | ICD-10-CM | POA: Diagnosis not present

## 2020-09-16 DIAGNOSIS — C786 Secondary malignant neoplasm of retroperitoneum and peritoneum: Secondary | ICD-10-CM | POA: Diagnosis not present

## 2020-09-16 DIAGNOSIS — M545 Low back pain, unspecified: Secondary | ICD-10-CM | POA: Diagnosis not present

## 2020-09-16 DIAGNOSIS — G8929 Other chronic pain: Secondary | ICD-10-CM | POA: Diagnosis not present

## 2020-09-16 DIAGNOSIS — F419 Anxiety disorder, unspecified: Secondary | ICD-10-CM | POA: Diagnosis not present

## 2020-09-16 DIAGNOSIS — M542 Cervicalgia: Secondary | ICD-10-CM | POA: Diagnosis not present

## 2020-09-16 DIAGNOSIS — T451X5A Adverse effect of antineoplastic and immunosuppressive drugs, initial encounter: Secondary | ICD-10-CM | POA: Diagnosis not present

## 2020-09-16 NOTE — Progress Notes (Addendum)
[facility] OFFICE PROGRESS NOTE   Diagnosis: Ovarian cancer  INTERVAL HISTORY:   [name] [name] returns as scheduled.  Overall she feels well.  She has a good appetite and good energy level.  No nausea or vomiting.  Bowels moving regularly.  Abdomen is "sore", this is chronic.  She does not feel distended.  She has intermittent low back pain and wonders if she has a "slipped disc".  Objective:  Vital signs in last 24 hours:  Blood pressure 115/65, pulse 86, temperature (!) 97 F (36.1 C), temperature source Tympanic, resp. rate 17, height 5' 3.5" (1.613 m), weight 152 lb 3.2 oz (69 kg), SpO2 100 %.    HEENT: No thrush or ulcers. Lymphatics: No palpable cervical or supraclavicular lymph nodes. Resp: Lungs clear bilaterally. Cardio: Regular rate and rhythm. GI: Abdomen soft and nontender.  No hepatomegaly.  No mass. Vascular: No leg edema.  Lab Results:  Lab Results  Component Value Date   WBC 6.4 05/01/2020   HGB 14.3 05/01/2020   HCT 43.5 05/01/2020   MCV 98.9 05/01/2020   PLT 256.0 05/01/2020   NEUTROABS 4.1 10/18/2014    Imaging:  No results found.  Medications: I have reviewed the patient's current medications.  Assessment/Plan: 1. 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 A 5 mm nodules remain on the right diaphragm. -  TumorNext paired germline/tumor analyses: No somatic variants detected, germline CHEK2      VUS       Cycle 1 of adjuvant Taxol/carboplatin chemotherapy initiated on 09/19/2012.   The CA 125 normalized.   She completed day 15 of cycle 6 on 02/06/2013.   Restaging CT evaluation 03/29/2013 showed no evidence of metastatic disease in the chest. There was marked improvement in appearance/resolution of previous described peritoneal/omental disease. There was no convincing evidence  of residual disease. There was minimal increased density in the region of the omentum favored to be treatment-related. There was no pelvic adenopathy.  CA125 3.2 on 02/19/2014.  08/18/2016 CA-1258  CT abdomen/pelvis 08/25/2016-solitary new enlarged right external iliac lymph node measuring 2.2 cm.  Biopsy 09/07/2016-adenocarcinoma consistent with high-grade serous carcinoma.  PET scan 09/22/2016-malignant range FDG uptake associated with the enlarged right external iliac lymph node compatible with metastatic adenopathy.  No additional sites of metastatic disease identified.  Radiation 10/25/2016-12/01/2016  PET scan 03/28/2017-previously enlarged hypermetabolic right external iliac node-normal in size with resolution of hypermetabolic activity, no active malignancy identified  PET scan 05/15/2018-no evidence of recurrent or metastatic disease, no hypermetabolic lymph nodes  CT abdomen/pelvis 09/19/2019-mild increase in the size of several left upper quadrant peritoneal nodules measuring up to 11 mm.  No other sites of metastatic disease identified.  No ascites.  CT abdomen/pelvis 03/14/2020-slight enlargement of several left upper quadrant peritoneal nodules, no ascites, no other evidence of disease progression  CT abdomen/pelvis 09/12/2020 peritoneal nodularity/omental caking predominantly in the left upper/mid abdomen, mildly progressive.  Largest implant in the left upper abdomen adjacent to the stomach now measures 17 mm, previously 13 mm.  Overall volume of peritoneal disease has mildly progressed. 2. Low abdomen/suprapubic pain prior to the exploratory laparotomy-likely secondary to omental/pelvic tumor; persistent mild pain in the lower abdomen  3. Chronic neck and back pain.  4. Anxiety -persistent despite Lexapro and Xanax. She has been evaluated by psychiatry. Currently on Lexapro. 5. Status post Port-A-Cath placement 09/22/2012. The Port-A-Cath was removed on 04/03/2013.  6.  Neutropenia secondary to chemotherapy- day 15 cycle 1 and cycle 3. Taxol/carboplatin not given secondary to neutropenia.  7.Herpes zoster involving a right thoracic dermatome July 2015. She completed a course of Valtrex. 8. Nodular bony prominence at the left pelvis on rectal exam 06/05/2015-likely a benign finding   Disposition: [name] [name] appears stable.  We reviewed the recent CT results/images showing mild progression of peritoneal disease with her.  She remains asymptomatic.  She is comfortable with continued observation.  Plan for restaging CTs in 4 months.  We will see her in follow-up a few days later to review the results.  She will contact the office in the interim with any problems.  Patient seen with Dr. Benay Spice.   Ned Card ANP/GNP-BC   09/16/2020  11:59 AM  This was a shared visit with Ned Card.  [name] [name] appears asymptomatic from the ovarian cancer.  The CT is consistent with slow disease progression.  She is most comfortable with continued observation.  Julieanne Manson, MD

## 2020-10-14 DIAGNOSIS — H524 Presbyopia: Secondary | ICD-10-CM | POA: Diagnosis not present

## 2020-10-16 DIAGNOSIS — Z03818 Encounter for observation for suspected exposure to other biological agents ruled out: Secondary | ICD-10-CM | POA: Diagnosis not present

## 2020-10-16 DIAGNOSIS — Z20822 Contact with and (suspected) exposure to covid-19: Secondary | ICD-10-CM | POA: Diagnosis not present

## 2021-01-13 ENCOUNTER — Other Ambulatory Visit: Payer: Medicare Other

## 2021-01-13 ENCOUNTER — Other Ambulatory Visit: Payer: Self-pay

## 2021-01-13 ENCOUNTER — Inpatient Hospital Stay: Payer: Medicare Other | Attending: Oncology

## 2021-01-13 ENCOUNTER — Ambulatory Visit (HOSPITAL_BASED_OUTPATIENT_CLINIC_OR_DEPARTMENT_OTHER)
Admission: RE | Admit: 2021-01-13 | Discharge: 2021-01-13 | Disposition: A | Discharge status: Home / Self Care | Payer: Medicare Other | Source: Ambulatory Visit | Attending: Nurse Practitioner | Admitting: Nurse Practitioner

## 2021-01-13 DIAGNOSIS — T451X5A Adverse effect of antineoplastic and immunosuppressive drugs, initial encounter: Secondary | ICD-10-CM | POA: Diagnosis not present

## 2021-01-13 DIAGNOSIS — N132 Hydronephrosis with renal and ureteral calculous obstruction: Secondary | ICD-10-CM | POA: Insufficient documentation

## 2021-01-13 DIAGNOSIS — G8929 Other chronic pain: Secondary | ICD-10-CM | POA: Diagnosis not present

## 2021-01-13 DIAGNOSIS — Z90722 Acquired absence of ovaries, bilateral: Secondary | ICD-10-CM | POA: Insufficient documentation

## 2021-01-13 DIAGNOSIS — M549 Dorsalgia, unspecified: Secondary | ICD-10-CM | POA: Diagnosis not present

## 2021-01-13 DIAGNOSIS — B029 Zoster without complications: Secondary | ICD-10-CM | POA: Insufficient documentation

## 2021-01-13 DIAGNOSIS — Z79899 Other long term (current) drug therapy: Secondary | ICD-10-CM | POA: Insufficient documentation

## 2021-01-13 DIAGNOSIS — F419 Anxiety disorder, unspecified: Secondary | ICD-10-CM | POA: Insufficient documentation

## 2021-01-13 DIAGNOSIS — N134 Hydroureter: Secondary | ICD-10-CM | POA: Diagnosis not present

## 2021-01-13 DIAGNOSIS — C569 Malignant neoplasm of unspecified ovary: Secondary | ICD-10-CM

## 2021-01-13 DIAGNOSIS — D701 Agranulocytosis secondary to cancer chemotherapy: Secondary | ICD-10-CM | POA: Diagnosis not present

## 2021-01-13 DIAGNOSIS — R1909 Other intra-abdominal and pelvic swelling, mass and lump: Secondary | ICD-10-CM | POA: Diagnosis not present

## 2021-01-13 DIAGNOSIS — M542 Cervicalgia: Secondary | ICD-10-CM | POA: Diagnosis not present

## 2021-01-13 DIAGNOSIS — I700 Atherosclerosis of aorta: Secondary | ICD-10-CM | POA: Insufficient documentation

## 2021-01-13 LAB — BASIC METABOLIC PANEL - CANCER CENTER ONLY
Anion gap: 7 (ref 5–15)
BUN: 20 mg/dL (ref 8–23)
CO2: 28 mmol/L (ref 22–32)
Calcium: 9.1 mg/dL (ref 8.9–10.3)
Chloride: 104 mmol/L (ref 98–111)
Creatinine: 0.65 mg/dL (ref 0.44–1.00)
GFR, Estimated: >60 mL/min (ref >60)
Glucose, Bld: 93 mg/dL (ref 70–99)
Potassium: 4.1 mmol/L (ref 3.5–5.1)
Sodium: 139 mmol/L (ref 135–145)

## 2021-01-13 LAB — POCT I-STAT CREATININE: Creatinine, Ser: 0.6 mg/dL (ref 0.44–1.00)

## 2021-01-13 MED ORDER — IOHEXOL 300 MG/ML  SOLN
80.0000 mL | Freq: Once | INTRAMUSCULAR | Status: AC | PRN
  Administered 2021-01-13: 80 mL via INTRAVENOUS

## 2021-01-15 ENCOUNTER — Other Ambulatory Visit: Payer: Self-pay | Admitting: Nurse Practitioner

## 2021-01-15 ENCOUNTER — Telehealth: Payer: Self-pay | Admitting: Nurse Practitioner

## 2021-01-15 DIAGNOSIS — C569 Malignant neoplasm of unspecified ovary: Secondary | ICD-10-CM

## 2021-01-15 NOTE — Telephone Encounter (Signed)
Message left for [name] [name] to please return call to the office to discuss recent CT scan results.

## 2021-01-16 ENCOUNTER — Telehealth: Payer: Self-pay

## 2021-01-16 NOTE — Telephone Encounter (Signed)
Called to speak with pt aware of most recent ct scan and urology referral, no answer message left for return call  Awaiting response

## 2021-01-16 NOTE — Telephone Encounter (Signed)
-----   Message from Owens Shark, NP sent at 01/15/2021  4:09 PM EDT ----- I tried to contact her several times today regarding the recent CT scan.  Please let her know the peritoneal nodules are slightly larger. Has a rt. ureter stone and hydronephrosis --  I will make urology referral. F/u as scheduled

## 2021-01-20 ENCOUNTER — Inpatient Hospital Stay: Payer: Medicare Other | Admitting: Oncology

## 2021-01-20 ENCOUNTER — Encounter: Payer: Self-pay | Admitting: *Deleted

## 2021-01-20 ENCOUNTER — Other Ambulatory Visit: Payer: Self-pay

## 2021-01-20 VITALS — BP 132/84 | HR 85 | Temp 97.8°F | Resp 17 | Ht 63.0 in | Wt 152.8 lb

## 2021-01-20 DIAGNOSIS — G8929 Other chronic pain: Secondary | ICD-10-CM | POA: Diagnosis not present

## 2021-01-20 DIAGNOSIS — D701 Agranulocytosis secondary to cancer chemotherapy: Secondary | ICD-10-CM | POA: Diagnosis not present

## 2021-01-20 DIAGNOSIS — M549 Dorsalgia, unspecified: Secondary | ICD-10-CM | POA: Diagnosis not present

## 2021-01-20 DIAGNOSIS — C569 Malignant neoplasm of unspecified ovary: Secondary | ICD-10-CM | POA: Diagnosis not present

## 2021-01-20 DIAGNOSIS — N132 Hydronephrosis with renal and ureteral calculous obstruction: Secondary | ICD-10-CM | POA: Diagnosis not present

## 2021-01-20 DIAGNOSIS — B029 Zoster without complications: Secondary | ICD-10-CM | POA: Diagnosis not present

## 2021-01-20 DIAGNOSIS — T451X5A Adverse effect of antineoplastic and immunosuppressive drugs, initial encounter: Secondary | ICD-10-CM | POA: Diagnosis not present

## 2021-01-20 DIAGNOSIS — Z90722 Acquired absence of ovaries, bilateral: Secondary | ICD-10-CM | POA: Diagnosis not present

## 2021-01-20 DIAGNOSIS — F419 Anxiety disorder, unspecified: Secondary | ICD-10-CM | POA: Diagnosis not present

## 2021-01-20 DIAGNOSIS — M542 Cervicalgia: Secondary | ICD-10-CM | POA: Diagnosis not present

## 2021-01-20 DIAGNOSIS — Z79899 Other long term (current) drug therapy: Secondary | ICD-10-CM | POA: Diagnosis not present

## 2021-01-20 NOTE — Progress Notes (Signed)
[place] OFFICE PROGRESS NOTE   Diagnosis: Ovarian cancer  INTERVAL HISTORY:   Ms. Chenard returns as scheduled.  She feels well.  Good appetite and energy level.  She has occasional right lower abdominal discomfort.  No consistent pain.  No difficulty with bowel or bladder function.  No bleeding.  Objective:  Vital signs in last 24 hours:  Blood pressure 132/84, pulse 85, temperature 97.8 F (36.6 C), temperature source Tympanic, resp. rate 17, height '5\' 3"'  (1.6 m), weight 152 lb 12.8 oz (69.3 kg), SpO2 99 %.   Lymphatics: No cervical, supraclavicular, axillary, or inguinal nodes Resp: Lungs clear bilaterally Cardio: Regular rate and rhythm GI: No hepatosplenomegaly, no apparent ascites, mild tenderness in the right lower abdomen, fullness superior to the right pelvic brim without a discrete mass Vascular: No leg edema    Lab Results:  Lab Results  Component Value Date   WBC 6.4 05/01/2020   HGB 14.3 05/01/2020   HCT 43.5 05/01/2020   MCV 98.9 05/01/2020   PLT 256.0 05/01/2020   NEUTROABS 4.1 10/18/2014    CMP  Lab Results  Component Value Date   NA 139 01/13/2021   K 4.1 01/13/2021   CL 104 01/13/2021   CO2 28 01/13/2021   GLUCOSE 93 01/13/2021   BUN 20 01/13/2021   CREATININE 0.60 01/13/2021   CALCIUM 9.1 01/13/2021   PROT 6.6 05/01/2020   ALBUMIN 4.2 05/01/2020   AST 19 05/01/2020   ALT 22 05/01/2020   ALKPHOS 57 05/01/2020   BILITOT 0.6 05/01/2020   GFRNONAA >60 01/13/2021   GFRAA >60 03/13/2020    Ca 125 on 01/13/2021: 34.3  Imaging: CT images from 01/13/2021 reviewed with Ms. Debbra Riding   Medications: I have reviewed the patient's current medications.   Assessment/Plan: 1. 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 A 5 mm nodules remain on the right diaphragm. -  TumorNext paired  germline/tumor analyses: No somatic variants detected, germline CHEK2      VUS       Cycle 1 of adjuvant Taxol/carboplatin chemotherapy initiated on 09/19/2012.   The CA 125 normalized.   She completed day 15 of cycle 6 on 02/06/2013.   Restaging CT evaluation 03/29/2013 showed no evidence of metastatic disease in the chest. There was marked improvement in appearance/resolution of previous described peritoneal/omental disease. There was no convincing evidence of residual disease. There was minimal increased density in the region of the omentum favored to be treatment-related. There was no pelvic adenopathy.  CA125 3.2 on 02/19/2014.  08/18/2016 CA-[id number]  CT abdomen/pelvis 08/25/2016-solitary new enlarged right external iliac lymph node measuring 2.2 cm.  Biopsy 09/07/2016-adenocarcinoma consistent with high-grade serous carcinoma.  PET scan 09/22/2016-malignant range FDG uptake associated with the enlarged right external iliac lymph node compatible with metastatic adenopathy.  No additional sites of metastatic disease identified.  Radiation 10/25/2016-12/01/2016  PET scan 03/28/2017-previously enlarged hypermetabolic right external iliac node-normal in size with resolution of hypermetabolic activity, no active malignancy identified  PET scan 05/15/2018-no evidence of recurrent or metastatic disease, no hypermetabolic lymph nodes  CT abdomen/pelvis 09/19/2019-mild increase in the size of several left upper quadrant peritoneal nodules measuring up to 11 mm.  No other sites of metastatic disease identified.  No ascites.  CT abdomen/pelvis 03/14/2020-slight enlargement of several left upper quadrant peritoneal nodules, no ascites, no other evidence of disease progression  CT abdomen/pelvis 09/12/2020 peritoneal nodularity/omental caking predominantly  in the left upper/mid abdomen, mildly progressive.  Largest implant in the left upper abdomen adjacent to the stomach now measures 17 mm,  previously 13 mm.  Overall volume of peritoneal disease has mildly progressed.  CT abdomen/pelvis 01/13/2021- 4 mm right ureteral calculus with moderate right hydronephrosis and upper right hydroureter, progressive omental nodularity, stable calcified and partially calcified right pelvic nodules 2. Low abdomen/suprapubic pain prior to the exploratory laparotomy-likely secondary to omental/pelvic tumor; persistent mild pain in the lower abdomen  3. Chronic neck and back pain.  4. Anxiety -persistent despite Lexapro and Xanax. She has been evaluated by psychiatry. Currently on Lexapro. 5. Status post Port-A-Cath placement 09/22/2012. The Port-A-Cath was removed on 04/03/2013.  6. Neutropenia secondary to chemotherapy- day 15 cycle 1 and cycle 3. Taxol/carboplatin not given secondary to neutropenia.  7.Herpes zoster involving a right thoracic dermatome July 2015. She completed a course of Valtrex. 8. Nodular bony prominence at the left pelvis on rectal exam 06/05/2015-likely a benign finding 9.  Right ureter stone/hydroureteronephrosis on CT 01/13/2021-referred to urology   Disposition: Ms. Even appears well.  She appears to have slow progression of ovarian cancer with progressive carcinomatosis.  She was found to have right hydroureteronephrosis with a right ureter stone on the CT 01/13/2021.  She has been referred to urology.  I reviewed the CT images and discussed treatment options with Ms. Marcelli.  The plan is to continue observation.  She understands we will likely recommend resuming systemic therapy in coming months.  It is unclear whether the intermittent right lower abdomen discomfort is related to the carcinomatosis, ureter stone, or another etiology.  We will see her after the consult with urology.  She has been maintained off of systemic therapy since completing Taxol/carboplatin chemotherapy in May 2014.  She would have a high chance of responding to repeat treatment with  Taxol/carboplatin.  [name], MD  01/20/2021  12:42 PM

## 2021-01-20 NOTE — Progress Notes (Signed)
[facility] to f/u on referral from 01/15/21--they had not received the  Referral. Faxed referral order, demographics and chart information to [facility] at [phone] att: Jenny Reichmann. Patient was also provided phone # for [facility] to call and follow up.

## 2021-01-23 LAB — CA 125: Cancer Antigen (CA) 125: 34.3 U/mL (ref 0.0–38.1)

## 2021-02-11 ENCOUNTER — Encounter: Payer: Self-pay | Admitting: Family Medicine

## 2021-02-11 ENCOUNTER — Other Ambulatory Visit: Payer: Self-pay

## 2021-02-11 ENCOUNTER — Ambulatory Visit (INDEPENDENT_AMBULATORY_CARE_PROVIDER_SITE_OTHER): Payer: Medicare Other | Admitting: Family Medicine

## 2021-02-11 VITALS — BP 128/80 | HR 66 | Temp 96.9°F | Ht 63.25 in | Wt 149.4 lb

## 2021-02-11 DIAGNOSIS — M8580 Other specified disorders of bone density and structure, unspecified site: Secondary | ICD-10-CM

## 2021-02-11 DIAGNOSIS — C786 Secondary malignant neoplasm of retroperitoneum and peritoneum: Secondary | ICD-10-CM

## 2021-02-11 DIAGNOSIS — E7800 Pure hypercholesterolemia, unspecified: Secondary | ICD-10-CM | POA: Diagnosis not present

## 2021-02-11 DIAGNOSIS — F32A Depression, unspecified: Secondary | ICD-10-CM

## 2021-02-11 DIAGNOSIS — Z0000 Encounter for general adult medical examination without abnormal findings: Secondary | ICD-10-CM

## 2021-02-11 DIAGNOSIS — C569 Malignant neoplasm of unspecified ovary: Secondary | ICD-10-CM

## 2021-02-11 DIAGNOSIS — M069 Rheumatoid arthritis, unspecified: Secondary | ICD-10-CM

## 2021-02-11 DIAGNOSIS — D126 Benign neoplasm of colon, unspecified: Secondary | ICD-10-CM

## 2021-02-11 DIAGNOSIS — W57XXXA Bitten or stung by nonvenomous insect and other nonvenomous arthropods, initial encounter: Secondary | ICD-10-CM

## 2021-02-11 DIAGNOSIS — S30860A Insect bite (nonvenomous) of lower back and pelvis, initial encounter: Secondary | ICD-10-CM

## 2021-02-11 DIAGNOSIS — K635 Polyp of colon: Secondary | ICD-10-CM | POA: Insufficient documentation

## 2021-02-11 DIAGNOSIS — F419 Anxiety disorder, unspecified: Secondary | ICD-10-CM

## 2021-02-11 LAB — CBC WITH DIFFERENTIAL/PLATELET
Basophils Absolute: 0 10*3/uL (ref 0.0–0.1)
Basophils Relative: 0.7 % (ref 0.0–3.0)
Eosinophils Absolute: 0.1 10*3/uL (ref 0.0–0.7)
Eosinophils Relative: 1.8 % (ref 0.0–5.0)
HCT: 41.4 % (ref 36.0–46.0)
Hemoglobin: 13.9 g/dL (ref 12.0–15.0)
Lymphocytes Relative: 14 % (ref 12.0–46.0)
Lymphs Abs: 0.8 10*3/uL (ref 0.7–4.0)
MCHC: 33.6 g/dL (ref 30.0–36.0)
MCV: 97.3 fl (ref 78.0–100.0)
Monocytes Absolute: 0.7 10*3/uL (ref 0.1–1.0)
Monocytes Relative: 12.3 % — ABNORMAL HIGH (ref 3.0–12.0)
Neutro Abs: 4.3 10*3/uL (ref 1.4–7.7)
Neutrophils Relative %: 71.2 % (ref 43.0–77.0)
Platelets: 254 10*3/uL (ref 150.0–400.0)
RBC: 4.25 Mil/uL (ref 3.87–5.11)
RDW: 13.6 % (ref 11.5–15.5)
WBC: 6.1 10*3/uL (ref 4.0–10.5)

## 2021-02-11 LAB — COMPREHENSIVE METABOLIC PANEL
ALT: 21 U/L (ref 0–35)
AST: 19 U/L (ref 0–37)
Albumin: 4.3 g/dL (ref 3.5–5.2)
Alkaline Phosphatase: 58 U/L (ref 39–117)
BUN: 23 mg/dL (ref 6–23)
CO2: 29 mEq/L (ref 19–32)
Calcium: 9.2 mg/dL (ref 8.4–10.5)
Chloride: 104 mEq/L (ref 96–112)
Creatinine, Ser: 0.73 mg/dL (ref 0.40–1.20)
GFR: 82.85 mL/min (ref >60.00)
Glucose, Bld: 94 mg/dL (ref 70–99)
Potassium: 4 mEq/L (ref 3.5–5.1)
Sodium: 141 mEq/L (ref 135–145)
Total Bilirubin: 0.6 mg/dL (ref 0.2–1.2)
Total Protein: 6.5 g/dL (ref 6.0–8.3)

## 2021-02-11 LAB — LIPID PANEL
Cholesterol: 219 mg/dL — ABNORMAL HIGH (ref 0–200)
HDL: 45.8 mg/dL (ref >39.00)
LDL Cholesterol: 153 mg/dL — ABNORMAL HIGH (ref 0–99)
NonHDL: 172.82
Total CHOL/HDL Ratio: 5
Triglycerides: 98 mg/dL (ref 0.0–149.0)
VLDL: 19.6 mg/dL (ref 0.0–40.0)

## 2021-02-11 LAB — TSH: TSH: 1.77 u[IU]/mL (ref 0.35–4.50)

## 2021-02-11 NOTE — Assessment & Plan Note (Signed)
Due for 5 y recall colonoscopy  Ref done Pt also has ovarian cancer

## 2021-02-11 NOTE — Assessment & Plan Note (Signed)
Disc goals for lipids and reasons to control them Rev last labs with pt Rev low sat fat diet in detail Labs today Intolerant to atorvastatin  Asked her to cut out fried food Taking fish oil

## 2021-02-11 NOTE — Assessment & Plan Note (Signed)
Per pt no treatment right now Does not feel she needs it

## 2021-02-11 NOTE — Assessment & Plan Note (Signed)
Oncology care  To start chemo again soon  Feels fairly good

## 2021-02-11 NOTE — Progress Notes (Signed)
Subjective:    Patient ID: [name] [name], female    DOB: 02/06/1951, 70 y.o.   MRN: [id number]  This visit occurred during the SARS-CoV-2 public health emergency.  Safety protocols were in place, including screening questions prior to the visit, additional usage of staff PPE, and extensive cleaning of exam room while observing appropriate contact time as indicated for disinfecting solutions.    HPI Pt presents for amw and health mt exam  70 yo pf of NP Baity  I have personally reviewed the Medicare Annual Wellness questionnaire and have noted 1. The patient's medical and social history 2. Their use of alcohol, tobacco or illicit drugs 3. Their current medications and supplements 4. The patient's functional ability including ADL's, fall risks, home safety risks and hearing or visual             impairment. 5. Diet and physical activities 6. Evidence for depression or mood disorders  The patients weight, height, BMI have been recorded in the chart and visual acuity is per eye clinic.  I have made referrals, counseling and provided education to the patient based review of the above and I have provided the pt with a written personalized care plan for preventive services. Reviewed and updated provider list, see scanned forms.  See scanned forms.  Routine anticipatory guidance given to patient.  See health maintenance. Colon cancer screening  Colonoscopy 10/15  Breast cancer screening  Mammogram 6/21 Self breast exam-no lumps  H/o ovarian cancer (planning to start chemo soon)  Oncology does her gyn visits now  Flu vaccine -missed this year  Tetanus vaccine Td 1/16 covid status not immunized , declines  Pneumovax-had prevnar 6/17- she declines the pna 23 at this time  Zoster status -she had shingles in the past and not ready for vaccine at this time  Dexa  11/20 osteopenia   Falls-none Fractures-none  Supplements- ca and D (right now holding vit D per oncologist order)  Exercise -  lots of yard work   Advance directive-has it utd  Cognitive function addressed- see scanned forms- and if abnormal then additional documentation follows.  Annoying short term forgetfulness (worse with chemo)  Nothing more than that  Has not become lost  Does math and finances  Overall very sharp and active   PMH and SH reviewed  Meds, vitals, and allergies reviewed.   ROS: See HPI.  Otherwise negative.    Weight : Wt Readings from Last 3 Encounters:  02/11/21 149 lb 7 oz (67.8 kg)  01/20/21 152 lb 12.8 oz (69.3 kg)  09/16/20 152 lb 3.2 oz (69 kg)   26.26 kg/m   Hearing/vision:  Hearing Screening   125Hz  250Hz  500Hz  1000Hz  2000Hz  3000Hz  4000Hz  6000Hz  8000Hz   Right ear:   40 0 0  0    Left ear:   0 40 0  0    Vision Screening Comments: Eye exam in Fall in 2021 at [facility]  struggles with hearing She has ear wax problems Talks loud    [facility]   BP Readings from Last 3 Encounters:  02/11/21 128/80  01/20/21 132/84  09/16/20 115/65   Pulse Readings from Last 3 Encounters:  02/11/21 66  01/20/21 85  09/16/20 86   H/o RA-no medications currently  Doing ok   Mood- doing well/not depressed  Keeps moving and that helps Gets outdoors a lot and that is good for her   Hyperlipidemia Lab Results  Component Value Date  CHOL 267 (H) 08/12/2020   HDL 51.30 08/12/2020   LDLCALC 178 (H) 08/12/2020   LDLDIRECT 157.5 09/11/2013   TRIG 191.0 (H) 08/12/2020   CHOLHDL 5 08/12/2020   Due for labs  Takes fish oil  Eats some fried foods but less than she used to Took atorvastatin and it caused leg pain  occ takes red yeast rice (can't remember to take it)   Patient Active Problem List   Diagnosis Date Noted  . Medicare annual wellness visit, subsequent 02/11/2021  . Routine general medical examination at a health care facility 02/11/2021  . Colon polyp 02/11/2021  . Tick bite of back 02/11/2021  . 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 Osteopenia 04/30/2019  . IBS (irritable bowel syndrome) 04/30/2019  . Ovarian cancer ([place]) 08/23/2012  . Rheumatoid arthritis ([place])   . HYPERCHOLESTEROLEMIA 05/15/2008  . Anxiety and depression 05/15/2008   Past Medical History:  Diagnosis Date  . Anxiety and depression    . DJD (degenerative joint disease)   . Elevated cholesterol   . Endometrial polyp   . Family history of bladder cancer   . Family history of lymphoma   . History of radiation therapy 10/25/16-12/01/16   right pelvis/60.2 Gy in 28 fractions  . IRRITABLE BOWEL SYNDROME, HX OF 05/15/2008  . MVP (mitral valve prolapse)    occ palpitations   . Ovarian ca ([place]) 07-2012      . Rheumatoid arthritis(714.0) ~ 2010   dr Ouida Sills  . Shingles    Past Surgical History:  Procedure Laterality Date  . ABDOMINAL HYSTERECTOMY  08/22/2012   Procedure: HYSTERECTOMY ABDOMINAL;  Surgeon: Alvino Chapel, MD;  Location: [facility];  Service: Gynecology;  Laterality: N/A;  . BACK SURGERY  2008   Dr Trenton Gammon  . COLONOSCOPY  04-20-2004  . COLONOSCOPY W/ BIOPSIES  07/2014  . COLOSTOMY REVISION  08/22/2012   Procedure: COLON RESECTION SIGMOID;  Surgeon: Alvino Chapel, MD;  Location: [facility];  Service: Gynecology;;  Rectal Sigmoid resection and low rectal anastomosis  . CYST ON NECK  2011  . DEBULKING  08/22/2012   Procedure: DEBULKING;  Surgeon: Alvino Chapel, MD;  Location: [facility];  Service: Gynecology;  Laterality: N/A;  Radical tumor debulking, Bilateral Ureterolysis  . DILATION AND CURETTAGE OF UTERUS  2004   WITH HYSTEROSCOPY  . HAND SURGERY  1996  . HYSTEROSCOPY  2004   D&C  . NECK SURGERY  2009   SPURS  . OMENTECTOMY  08/22/2012   Procedure: OMENTECTOMY;  Surgeon: Alvino Chapel, MD;  Location: [facility];  Service: Gynecology;  Laterality: N/A;  . SALPINGOOPHORECTOMY  08/22/2012   Procedure: SALPINGO OOPHORECTOMY;  Surgeon:  Alvino Chapel, MD;  Location: [facility];  Service: Gynecology;  Laterality: Bilateral;  . TUBAL LIGATION  1980   Social History   Tobacco Use  . Smoking status: Never Smoker  . Smokeless tobacco: Never Used  Vaping Use  . Vaping Use: Never used  Substance Use Topics  . Alcohol use: No  . Drug use: No   Family History  Problem Relation Age of Onset  . Heart disease Mother   . Bladder Cancer Father 46  . Lymphoma Paternal Aunt   . Osteoporosis Sister   . Colon cancer Neg Hx   . Rectal cancer Neg Hx   . Stomach cancer Neg Hx   . Diabetes Neg Hx   . Stroke Neg Hx    Allergies  Allergen  Reactions  . Atorvastatin Other (See Comments)    Leg pain  . Macrodantin Other (See Comments)    Unknown Reaction  . Decongest-Aid [Pseudoephedrine] Palpitations   Current Outpatient Medications on File Prior to Visit  Medication Sig Dispense Refill  . fish oil-omega-3 fatty acids 1000 MG capsule Take 1 g by mouth daily.    . Flaxseed, Linseed, (FLAXSEED OIL PO) Take by mouth.    . Multiple Vitamin (MULTIVITAMIN WITH MINERALS) TABS Take 1 tablet by mouth daily.     No current facility-administered medications on file prior to visit.    Review of Systems  Constitutional: Positive for fatigue. Negative for activity change, appetite change, fever and unexpected weight change.  HENT: Negative for congestion, ear pain, rhinorrhea, sinus pressure and sore throat.   Eyes: Negative for pain, redness and visual disturbance.  Respiratory: Negative for cough, shortness of breath and wheezing.   Cardiovascular: Negative for chest pain and palpitations.  Gastrointestinal: Negative for abdominal pain, blood in stool, constipation and diarrhea.  Endocrine: Negative for polydipsia and polyuria.  Genitourinary: Negative for dysuria, frequency and urgency.  Musculoskeletal: Negative for arthralgias, back pain and myalgias.  Skin: Negative for pallor and rash.  Allergic/Immunologic: Negative for  environmental allergies.  Neurological: Negative for dizziness, syncope and headaches.  Hematological: Negative for adenopathy. Does not bruise/bleed easily.  Psychiatric/Behavioral: Negative for decreased concentration and dysphoric mood. The patient is not nervous/anxious.        Objective:   Physical Exam Constitutional:      General: She is not in acute distress.    Appearance: Normal appearance. She is well-developed and normal weight. She is not ill-appearing or diaphoretic.  HENT:     Head: Normocephalic and atraumatic.     Right Ear: Tympanic membrane, ear canal and external ear normal.     Left Ear: Tympanic membrane, ear canal and external ear normal.     Nose: Nose normal. No congestion.     Mouth/Throat:     Mouth: Mucous membranes are moist.     Pharynx: Oropharynx is clear. No posterior oropharyngeal erythema.  Eyes:     General: No scleral icterus.    Extraocular Movements: Extraocular movements intact.     Conjunctiva/sclera: Conjunctivae normal.     Pupils: Pupils are equal, round, and reactive to light.  Neck:     Thyroid: No thyromegaly.     Vascular: No carotid bruit or JVD.  Cardiovascular:     Rate and Rhythm: Normal rate and regular rhythm.     Pulses: Normal pulses.     Heart sounds: Normal heart sounds. No gallop.   Pulmonary:     Effort: Pulmonary effort is normal. No respiratory distress.     Breath sounds: Normal breath sounds. No wheezing.     Comments: Good air exch Chest:     Chest wall: No tenderness.  Abdominal:     General: Bowel sounds are normal. There is no distension or abdominal bruit.     Palpations: Abdomen is soft. There is no mass.     Tenderness: There is no abdominal tenderness.     Hernia: No hernia is present.  Genitourinary:    Comments: Breast exam: No mass, nodules, thickening, tenderness, bulging, retraction, inflamation, nipple discharge or skin changes noted.  No axillary or clavicular LA.     Musculoskeletal:         General: No tenderness. Normal range of motion.     Cervical back: Normal range of motion and  neck supple. No rigidity. No muscular tenderness.     Right lower leg: No edema.     Left lower leg: No edema.  Lymphadenopathy:     Cervical: No cervical adenopathy.  Skin:    General: Skin is warm and dry.     Coloration: Skin is not pale.     Findings: No erythema or rash.     Comments: Solar lentigines diffusely Some solar aging Few sks  Small scab from tick bite on L back 2-3 mm with some mild erythema (no induration) No tick parts noted  Neurological:     Mental Status: She is alert. Mental status is at baseline.     Cranial Nerves: No cranial nerve deficit.     Motor: No abnormal muscle tone.     Coordination: Coordination normal.     Gait: Gait normal.     Deep Tendon Reflexes: Reflexes are normal and symmetric. Reflexes normal.  Psychiatric:        Mood and Affect: Mood normal.        Cognition and Memory: Cognition and memory normal.           Assessment & Plan:   Problem List Items Addressed This Visit      Digestive   Colon polyp    Due for 5 y recall colonoscopy  Ref done Pt also has ovarian cancer      Relevant Orders   Ambulatory referral to Gastroenterology     Endocrine   Ovarian cancer [facility])    Oncology care  To start chemo again soon  Feels fairly good        Musculoskeletal and Integument   Rheumatoid arthritis ([place])    Per pt no treatment right now Does not feel she needs it        Osteopenia    dexa 11/20  No falls or fx Holding D right now per onc advisement per pt  Some outdoor exercise  Can re check after 11/22      Tick bite of back    Small erythematous scab w/o any bullseye shape on R back  No retained tick parts Adv to keep clean  Watch for change in bite or any rash or new fever or joint c/o - inst pt to update         Other   HYPERCHOLESTEROLEMIA    Disc goals for lipids and reasons to control them Rev last labs  with pt Rev low sat fat diet in detail Labs today Intolerant to atorvastatin  Asked her to cut out fried food Taking fish oil      Relevant Orders   Lipid panel   Anxiety and depression    Per pt mood is better No medication currently      Medicare annual wellness visit, subsequent - Primary    Reviewed health habits including diet and exercise and skin cancer prevention Reviewed appropriate screening tests for age  Also reviewed health mt list, fam hx and immunization status , as well as social and family history   See HPI Labs ordered Colonoscopy ref done  Mammogram due next mo-pt will schedule Enc flu shot yearly  She declines covid vaccines and wears a mask Due for pna 23, she declines  Declines shingrix vaccine but may consider later Rev last dexa, no falls or fractures Adv directive is utd No cognitive concerns Hearing loss noted on screen-she plans to f/u per her insurance for audiology eval  utd vision and eye  care Mood is good       Routine general medical examination at a health care facility    Reviewed health habits including diet and exercise and skin cancer prevention Reviewed appropriate screening tests for age  Also reviewed health mt list, fam hx and immunization status , as well as social and family history   See HPI Labs ordered Colonoscopy ref done  Mammogram due next mo-pt will schedule Enc flu shot yearly  She declines covid vaccines and wears a mask Due for pna 23, she declines  Declines shingrix vaccine but may consider later Rev last dexa, no falls or fractures Adv directive is utd No cognitive concerns Hearing loss noted on screen-she plans to f/u per her insurance for audiology eval  utd vision and eye care Mood is good       Relevant Orders   CBC with Differential/Platelet   Comprehensive metabolic panel   Lipid panel   TSH   Secondary malignant neoplasm of retroperitoneum and peritoneum ([place])    Ovarian cancer Preparing to  start treatment again  Spirits are up

## 2021-02-11 NOTE — Assessment & Plan Note (Signed)
Per pt mood is better No medication currently

## 2021-02-11 NOTE — Assessment & Plan Note (Signed)
Small erythematous scab w/o any bullseye shape on R back  No retained tick parts Adv to keep clean  Watch for change in bite or any rash or new fever or joint c/o - inst pt to update

## 2021-02-11 NOTE — Patient Instructions (Addendum)
Schedule your mammogram for next month   I placed a colonoscopy referral so you will get a call   If you are interested in the new shingles vaccine (Shingrix) - call your local pharmacy to check on coverage and availability  If affordable, get on a wait list at your pharmacy to get the vaccine.  Labs today   For cholesterol  Avoid red meat/ fried foods/ egg yolks/ fatty breakfast meats/ butter, cheese and high fat dairy/ and shellfish    Watch the tick bite area  Keep it clean  If bigger or if it looks like a bullseye or if rash or fever let us know   Go forward with hearing evaluation (with [employer])  If you need a referral let us know   Take care of yourself

## 2021-02-11 NOTE — Assessment & Plan Note (Signed)
Ovarian cancer Preparing to start treatment again  Spirits are up

## 2021-02-11 NOTE — Assessment & Plan Note (Signed)
Reviewed health habits including diet and exercise and skin cancer prevention Reviewed appropriate screening tests for age  Also reviewed health mt list, fam hx and immunization status , as well as social and family history   See HPI Labs ordered Colonoscopy ref done  Mammogram due next mo-pt will schedule Enc flu shot yearly  She declines covid vaccines and wears a mask Due for pna 23, she declines  Declines shingrix vaccine but may consider later Rev last dexa, no falls or fractures Adv directive is utd No cognitive concerns Hearing loss noted on screen-she plans to f/u per her insurance for audiology eval  utd vision and eye care Mood is good

## 2021-02-11 NOTE — Assessment & Plan Note (Signed)
Reviewed health habits including diet and exercise and skin cancer prevention Reviewed appropriate screening tests for age  Also reviewed health mt list, fam hx and immunization status , as well as social and family history   See HPI Labs ordered Colonoscopy ref done  Mammogram due next mo-pt will schedule Enc flu shot yearly  She declines covid vaccines and wears a mask Due for pna 23, she declines  Declines shingrix vaccine but may consider later Rev last dexa, no falls or fractures Adv directive is utd No cognitive concerns Hearing loss noted on screen-she plans to f/u per her insurance for audiology eval  utd vision and eye care Mood is good  

## 2021-02-11 NOTE — Assessment & Plan Note (Signed)
dexa 11/20  No falls or fx Holding D right now per onc advisement per pt  Some outdoor exercise  Can re check after 11/22

## 2021-02-12 ENCOUNTER — Encounter: Payer: Self-pay | Admitting: *Deleted

## 2021-02-12 DIAGNOSIS — H524 Presbyopia: Secondary | ICD-10-CM | POA: Diagnosis not present

## 2021-02-18 ENCOUNTER — Inpatient Hospital Stay: Payer: Medicare Other | Attending: Oncology | Admitting: Nurse Practitioner

## 2021-02-19 DIAGNOSIS — Z8543 Personal history of malignant neoplasm of ovary: Secondary | ICD-10-CM | POA: Diagnosis not present

## 2021-02-19 DIAGNOSIS — N200 Calculus of kidney: Secondary | ICD-10-CM | POA: Diagnosis not present

## 2021-02-19 DIAGNOSIS — R3129 Other microscopic hematuria: Secondary | ICD-10-CM | POA: Diagnosis not present

## 2021-02-19 DIAGNOSIS — N201 Calculus of ureter: Secondary | ICD-10-CM | POA: Diagnosis not present

## 2021-02-23 DIAGNOSIS — Z20822 Contact with and (suspected) exposure to covid-19: Secondary | ICD-10-CM | POA: Diagnosis not present

## 2021-03-03 ENCOUNTER — Telehealth: Payer: Self-pay | Admitting: Family Medicine

## 2021-03-03 NOTE — Telephone Encounter (Signed)
Pt needs her past visit named as a  wellness check and not physical  due to her insurance. Visit was may 11th, 2022.

## 2021-03-03 NOTE — Telephone Encounter (Signed)
Sending to [facility], thanks  Does that mean I keep the medicare wellness dx and eliminate the health mt/annual ?     Thanks -I will append once I know

## 2021-03-04 DIAGNOSIS — N201 Calculus of ureter: Secondary | ICD-10-CM | POA: Diagnosis not present

## 2021-03-04 DIAGNOSIS — N132 Hydronephrosis with renal and ureteral calculous obstruction: Secondary | ICD-10-CM | POA: Diagnosis not present

## 2021-03-11 DIAGNOSIS — N201 Calculus of ureter: Secondary | ICD-10-CM | POA: Diagnosis not present

## 2021-03-12 NOTE — Addendum Note (Signed)
Addended by: Baruch Merl on: 03/12/2021 04:14 PM   Modules accepted: Orders

## 2021-03-16 ENCOUNTER — Encounter: Payer: Self-pay | Admitting: Gynecologic Oncology

## 2021-03-17 ENCOUNTER — Inpatient Hospital Stay: Payer: Medicare Other | Attending: Oncology | Admitting: Nurse Practitioner

## 2021-03-17 ENCOUNTER — Inpatient Hospital Stay (HOSPITAL_BASED_OUTPATIENT_CLINIC_OR_DEPARTMENT_OTHER): Payer: Medicare Other | Admitting: Gynecologic Oncology

## 2021-03-17 ENCOUNTER — Encounter: Payer: Self-pay | Admitting: Nurse Practitioner

## 2021-03-17 ENCOUNTER — Other Ambulatory Visit: Payer: Self-pay

## 2021-03-17 ENCOUNTER — Inpatient Hospital Stay: Payer: Medicare Other

## 2021-03-17 ENCOUNTER — Encounter: Payer: Self-pay | Admitting: Gynecologic Oncology

## 2021-03-17 VITALS — BP 125/75 | HR 83 | Temp 97.3°F | Resp 18 | Ht 63.0 in | Wt 148.2 lb

## 2021-03-17 VITALS — BP 120/70 | HR 90 | Temp 97.7°F | Resp 18 | Ht 63.0 in | Wt 151.0 lb

## 2021-03-17 DIAGNOSIS — M069 Rheumatoid arthritis, unspecified: Secondary | ICD-10-CM | POA: Diagnosis not present

## 2021-03-17 DIAGNOSIS — M549 Dorsalgia, unspecified: Secondary | ICD-10-CM | POA: Insufficient documentation

## 2021-03-17 DIAGNOSIS — Z79899 Other long term (current) drug therapy: Secondary | ICD-10-CM | POA: Diagnosis not present

## 2021-03-17 DIAGNOSIS — M542 Cervicalgia: Secondary | ICD-10-CM | POA: Insufficient documentation

## 2021-03-17 DIAGNOSIS — Z923 Personal history of irradiation: Secondary | ICD-10-CM | POA: Diagnosis not present

## 2021-03-17 DIAGNOSIS — C569 Malignant neoplasm of unspecified ovary: Secondary | ICD-10-CM

## 2021-03-17 DIAGNOSIS — Z9221 Personal history of antineoplastic chemotherapy: Secondary | ICD-10-CM | POA: Insufficient documentation

## 2021-03-17 DIAGNOSIS — G8929 Other chronic pain: Secondary | ICD-10-CM | POA: Diagnosis not present

## 2021-03-17 DIAGNOSIS — F419 Anxiety disorder, unspecified: Secondary | ICD-10-CM | POA: Diagnosis not present

## 2021-03-17 DIAGNOSIS — Z8543 Personal history of malignant neoplasm of ovary: Secondary | ICD-10-CM | POA: Insufficient documentation

## 2021-03-17 LAB — CMP (CANCER CENTER ONLY)
ALT: 21 U/L (ref 0–44)
AST: 21 U/L (ref 15–41)
Albumin: 3.9 g/dL (ref 3.5–5.0)
Alkaline Phosphatase: 60 U/L (ref 38–126)
Anion gap: 10 (ref 5–15)
BUN: 24 mg/dL — ABNORMAL HIGH (ref 8–23)
CO2: 25 mmol/L (ref 22–32)
Calcium: 9.5 mg/dL (ref 8.9–10.3)
Chloride: 106 mmol/L (ref 98–111)
Creatinine: 0.81 mg/dL (ref 0.44–1.00)
GFR, Estimated: >60 mL/min (ref >60)
Glucose, Bld: 152 mg/dL — ABNORMAL HIGH (ref 70–99)
Potassium: 3.9 mmol/L (ref 3.5–5.1)
Sodium: 141 mmol/L (ref 135–145)
Total Bilirubin: 0.6 mg/dL (ref 0.3–1.2)
Total Protein: 7.1 g/dL (ref 6.5–8.1)

## 2021-03-17 NOTE — Progress Notes (Signed)
[facility] OFFICE PROGRESS NOTE   Diagnosis: Ovarian cancer  INTERVAL HISTORY:   [name] [name] returns for follow-up.  She is well.  She has a good appetite.  She has mild intermittent pain at the right low abdomen.  Bowels moving regularly.  No nausea or vomiting.  She does not feel bloated.  Objective:  Vital signs in last 24 hours:  Blood pressure 120/70, pulse 90, temperature 97.7 F (36.5 C), temperature source Oral, resp. rate 18, height '5\' 3"'  (1.6 m), weight 151 lb (68.5 kg), SpO2 98 %.    Resp: Lungs clear bilaterally. Cardio: Regular rate and rhythm. GI: Abdomen soft.  Mild tenderness at the right low abdomen.  No hepatomegaly.  No mass.  No apparent ascites. Vascular: No leg edema.   Lab Results:  Lab Results  Component Value Date   WBC 6.1 02/11/2021   HGB 13.9 02/11/2021   HCT 41.4 02/11/2021   MCV 97.3 02/11/2021   PLT 254.0 02/11/2021   NEUTROABS 4.3 02/11/2021    Imaging:  No results found.  Medications: I have reviewed the patient's current medications.  Assessment/Plan: 1. 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 A 5 mm nodules remain on the right diaphragm. - TumorNext paired germline/tumor analyses: No somatic variants detected, germline CHEK2      VUS      Cycle 1 of adjuvant Taxol/carboplatin chemotherapy initiated on 09/19/2012.   The CA 125 normalized.   She completed day 15 of cycle 6 on 02/06/2013.   Restaging CT evaluation 03/29/2013 showed no evidence of metastatic disease in the chest. There was marked improvement in appearance/resolution of previous described peritoneal/omental disease. There was no convincing evidence of residual disease. There was minimal increased density in the region of the omentum favored to be treatment-related. There was no pelvic adenopathy. CA125  3.2 on 02/19/2014. 08/18/2016 CA-125 8 CT abdomen/pelvis 08/25/2016-solitary new enlarged right external iliac lymph node measuring 2.2 cm. Biopsy 09/07/2016-adenocarcinoma consistent with high-grade serous carcinoma. PET scan 09/22/2016-malignant range FDG uptake associated with the enlarged right external iliac lymph node compatible with metastatic adenopathy.  No additional sites of metastatic disease identified. Radiation 10/25/2016-12/01/2016 PET scan 03/28/2017-previously enlarged hypermetabolic right external iliac node-normal in size with resolution of hypermetabolic activity, no active malignancy identified PET scan 05/15/2018-no evidence of recurrent or metastatic disease, no hypermetabolic lymph nodes CT abdomen/pelvis 09/19/2019-mild increase in the size of several left upper quadrant peritoneal nodules measuring up to 11 mm.  No other sites of metastatic disease identified.  No ascites. CT abdomen/pelvis 03/14/2020-slight enlargement of several left upper quadrant peritoneal nodules, no ascites, no other evidence of disease progression CT abdomen/pelvis 09/12/2020 peritoneal nodularity/omental caking predominantly in the left upper/mid abdomen, mildly progressive.  Largest implant in the left upper abdomen adjacent to the stomach now measures 17 mm, previously 13 mm.  Overall volume of peritoneal disease has mildly progressed. CT abdomen/pelvis 01/13/2021- 4 mm right ureteral calculus with moderate right hydronephrosis and upper right hydroureter, progressive omental nodularity, stable calcified and partially calcified right pelvic nodules 2. Low abdomen/suprapubic pain prior to the exploratory laparotomy-likely secondary to omental/pelvic tumor; persistent mild pain in the lower abdomen   3. Chronic neck and back pain.   4. Anxiety -persistent despite Lexapro and Xanax. She has been evaluated by psychiatry. Currently on Lexapro. 5. Status post Port-A-Cath placement 09/22/2012. The Port-A-Cath  was removed on 04/03/2013.   6. Neutropenia  secondary to chemotherapy- day 15 cycle 1 and cycle 3. Taxol/carboplatin not given secondary to neutropenia.    7. Herpes zoster involving a right thoracic dermatome July 2015. She completed a course of Valtrex. 8. Nodular bony prominence at the left pelvis on rectal exam 06/05/2015-likely a benign finding 9.  Right ureter stone/hydroureteronephrosis on CT 01/13/2021-referred to urology; status post right ureteroscopy with stone extraction and ureteral stent placement.  Stent removal 03/11/2021    Disposition: [name] [name] appears stable.  She understands she likely has slow progression of the ovarian cancer but in general feels very well aside from mild intermittent discomfort at the right low abdomen which may or may not be related.  We will follow-up on the CA125 to be done later today.  Dr. Benay Spice recommends continued observation, plan restaging CT scans in approximately 2 months.  She agrees with this plan.  She has a follow-up appointment with Dr. Berline Lopes later today.  We will see her in follow-up in approximately 2 months, a few days after the CT scans.  Patient seen with Dr. Benay Spice by video.    Ned Card ANP/GNP-BC   03/17/2021  2:12 PM This was a shared visit with Ned Card.  [name] [name] appears asymptomatic from ovarian cancer.  The tumor is behaving in an indolent fashion.  No therapy will be curative.  We decided to continue observation unless she calls new symptoms or has evidence of significant progression on a repeat CT.  She has been maintained off of systemic therapy the past 8 years will have a significant chance of a response to hemotherapy.  [name] [name] is comfortable with continuing observation.  I was present for greater than 50% of today's visit.  I performed medical decision making.  Julieanne Manson, MD

## 2021-03-17 NOTE — Patient Instructions (Signed)
It was great to see you!  I will follow-up with you when your CA-125 is back.  Depending on what the value is, we will discuss getting a CT scan sooner than 2 months versus waiting.  I will see you in December although this plan may change depending on what we talked about with your CA-125 results.

## 2021-03-17 NOTE — Progress Notes (Signed)
Gynecologic Oncology Return Clinic Visit  03/17/21  Reason for Visit: surveillance in the setting of ovarian cancer  Treatment History: Oncology History Overview Note  CA-125 10/10/113: 69 02/2013: 5.6, post adjuvant chemotherapy (03/2013-02/2016): 3.2-8.8 08/2016: 10.2, just prior to recurrence diagnosis 02/14/17: 7.1, 3 months post completion of RT 09/17/19: 6.5  In 2014, presented with pelvic pain and pressure, urinary frequency.  In 08/2016, presented with 3 months of constipation and RLQ pain. On exam, had some fullness and tenderness along right pelvic sidewall.    Ovarian cancer ([place])  07/12/2012 Imaging   PET: significant soft tissue thickening seen throughout the greater omentum suspicious for intraperitoneal carcinoma. There is minimal ascites in the pelvis. it revealed the areas of omental and peritoneal nodularity were hypermetabolic. There is a dominant pelvic omental mass measuring 10.4 x 3.3 cm. There is a small omental implants in the left side of the abdomen measuring 1.1 cm. There were multiple pelvic foci That wereperitoneal based And hypermetabolic. These included the cul-de-sac. She has small volume of ascites.    07/24/2012 Initial Biopsy   CT-guided revealed metastatic carcinoma. By [facility], malignant cells were positive for WT 1, cytokeratin 7, estrogen receptor. There were negative for TTF-1, progesterone receptor, cytokeratin 20, CEA, CD X2, and S100. Overall, the histologic features were highly suggestive of a primary gynecologic origin.   08/22/2012 Surgery   Radical debulking - TAH/BSO, ureterolysis, omentectomy, stripping of pelvic peritoneum, omentectomy, and rectosigmoid resection with low rectal reanastomosis Findings: stage IIIC ovarian cancer. Patient was optimally debulked. Debulking, however, required a rectosigmoid resection as well as total omentectomy total nominal hysterectomy bilateral salpingo-oophorectomy. She was optimally debulked with only 5 mm  nodules on the right diaphragm as residual disease.    08/23/2012 Initial Diagnosis   Ovarian cancer ([place])   09/2012 - 02/06/2013 Chemotherapy   6 cycles of carboplatin/taxol    03/29/2013 Imaging   CT C/A/P: 1.  Marked response to therapy of omental/peritoneal disease.  No residual measurable disease identified. 2.  Interval hysterectomy and bilateral oophrectomy.   08/25/2016 Imaging   CT A/P: 1. Solitary new enlarged right external iliac lymph node, suspicious for metastatic nodal recurrence. 2. No additional sites of metastatic disease in the abdomen or pelvis. 3. Aortic atherosclerosis.   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 There is malignant range FDG uptake associated with the enlarged right external iliac lymph node compatible with metastatic adenopathy. 2. No additional sites of metastatic disease identified. 3. Aortic atherosclerosis.   10/25/2016 - 12/01/2016 Radiation Therapy   Radiation to right pelvis treated to 60.2Gy in 28 fractions   03/28/2017 Imaging   PET post RT: 1. The previously enlarged and hypermetabolic right external iliac lymph node is currently of normal size and the previous hypermetabolic activity has resolved. No current active malignancy identified. 2. 2 mm right mid kidney nonobstructive renal calculus. 3.  Aortic Atherosclerosis (ICD10-I70.0).   09/23/2017 Imaging   CT A/P: No evidence of bowel obstruction.  Normal appendix. No CT findings to account for the patient's worsening right lower quadrant abdominal pain. No evidence of recurrent or metastatic disease.   10/31/2017 Imaging   PET: No evidence local ovarian carcinoma recurrence in the pelvis. No metastatic adenopathy or distant metastatic disease.   05/15/2018 Imaging   PET: 1. No evidence recurrent or  metastatic disease. 2.  Aortic atherosclerosis (ICD10-170.0).   09/19/2019 Imaging   CT A/P: Mild increase in size of several left upper quadrant peritoneal nodules measuring up to 11 mm, highly suspicious for increased peritoneal carcinomatosis. No other sites of metastatic disease identified. No evidence of ascites.   11/27/2019 Genetic Testing   CHEK2 p.N405K VUS found on the cancerNext HRD testing.  The CancerNext gene panel offered by [employer] includes sequencing and rearrangement analysis for the following 34 genes:   APC, ATM, BARD1, BMPR1A, BRCA1, BRCA2, BRIP1, CDH1, CDK4, CDKN2A, CHEK2, DICER1, HOXB13, EPCAM, GREM1, MLH1, MRE11A, MSH2, MSH6, MUTYH, NBN, NF1, PALB2, PMS2, POLD1, POLE, PTEN, RAD50, RAD51C, RAD51D, SMAD4, SMARCA4, STK11, and TP53.  The report date is November 27, 2019.  HRD testing did not identify any pathogenic variants in the tumor.  Therefore HRD is neg.     Interval History: The patient reports having good appetite without any nausea or emesis.  She denies any abdominal or pelvic pain.  She denies any vaginal bleeding or discharge.  She recently underwent procedure due to kidney stone.  She had a stent for a week after surgery which was taken out last Tuesday. She is feeling very well.   Past Medical/Surgical History: Past Medical History:  Diagnosis Date   Anxiety and depression     DJD (degenerative joint disease)    Elevated cholesterol    Endometrial polyp    Family history of bladder cancer    Family history of lymphoma    History of radiation therapy 10/25/16-12/01/16   right pelvis/60.2 Gy in 28 fractions   IRRITABLE BOWEL SYNDROME, HX OF 05/15/2008   Kidney stone    MVP (mitral valve prolapse)    occ palpitations    Ovarian ca ([place]) 07/2012       Rheumatoid arthritis(714.0) ~ 2010   dr Ouida Sills   Shingles     Past Surgical History:  Procedure Laterality Date   ABDOMINAL HYSTERECTOMY  08/22/2012   Procedure: HYSTERECTOMY ABDOMINAL;   Surgeon: Alvino Chapel, MD;  Location: [facility];  Service: Gynecology;  Laterality: N/A;   BACK SURGERY  10/04/2006   Dr Trenton Gammon   COLONOSCOPY  04/20/2004   COLONOSCOPY W/ BIOPSIES  07/04/2014   COLOSTOMY REVISION  08/22/2012   Procedure: COLON RESECTION SIGMOID;  Surgeon: Alvino Chapel, MD;  Location: [facility];  Service: Gynecology;;  Rectal Sigmoid resection and low rectal anastomosis   CYST ON NECK  10/04/2009   DEBULKING  08/22/2012   Procedure: DEBULKING;  Surgeon: Alvino Chapel, MD;  Location: [facility];  Service: Gynecology;  Laterality: N/A;  Radical tumor debulking, Bilateral Ureterolysis   DILATION AND CURETTAGE OF UTERUS  10/04/2002   WITH HYSTEROSCOPY   HAND SURGERY  10/04/1994   HYSTEROSCOPY  10/04/2002   D&C   KIDNEY STONE SURGERY Right    with stent placement  Mar 03 2021   NECK SURGERY  10/05/2007   SPURS   OMENTECTOMY  08/22/2012   Procedure: OMENTECTOMY;  Surgeon: Alvino Chapel, MD;  Location: [facility];  Service: Gynecology;  Laterality: N/A;   SALPINGOOPHORECTOMY  08/22/2012   Procedure: SALPINGO OOPHORECTOMY;  Surgeon: Alvino Chapel, MD;  Location: [facility];  Service: Gynecology;  Laterality: Bilateral;   TUBAL LIGATION  10/04/1978    Family History  Problem Relation Age of Onset   Heart disease Mother    Bladder Cancer Father 55   Lymphoma Paternal Aunt    Osteoporosis Sister    Colon cancer Neg Hx  Rectal cancer Neg Hx    Stomach cancer Neg Hx    Diabetes Neg Hx    Stroke Neg Hx     Social History   Socioeconomic History   Marital status: Married    Spouse name: Not on file   Number of children: 2   Years of education: Not on file   Highest education level: Not on file  Occupational History   Occupation: disability d/t RA  Tobacco Use   Smoking status: Never   Smokeless tobacco: Never  Vaping Use   Vaping Use: Never used  Substance and Sexual Activity   Alcohol use: No   Drug use: No   Sexual  activity: Never    Birth control/protection: Post-menopausal, Surgical  Other Topics Concern   Not on file  Social History Narrative   Anne Ng Beulah Gandy is her daughter    Household-- pr and husband   Social Determinants of Radio broadcast assistant Strain: Not on file  Food Insecurity: Not on file  Transportation Needs: Not on file  Physical Activity: Not on file  Stress: Not on file  Social Connections: Not on file    Current Medications:  Current Outpatient Medications:    fish oil-omega-3 fatty acids 1000 MG capsule, Take 1 g by mouth daily., Disp: , Rfl:    Multiple Vitamin (MULTIVITAMIN WITH MINERALS) TABS, Take 1 tablet by mouth daily., Disp: , Rfl:    Red Yeast Rice Extract 600 MG CAPS, Take 1 capsule by mouth daily., Disp: , Rfl:   Review of Systems: Denies appetite changes, fevers, chills, fatigue, unexplained weight changes. Denies hearing loss, neck lumps or masses, mouth sores, ringing in ears or voice changes. Denies cough or wheezing.  Denies shortness of breath. Denies chest pain or palpitations. Denies leg swelling. Denies abdominal distention, pain, blood in stools, constipation, diarrhea, nausea, vomiting, or early satiety. Denies pain with intercourse, dysuria, frequency, hematuria or incontinence. Denies hot flashes, pelvic pain, vaginal bleeding or vaginal discharge.   Denies joint pain, back pain or muscle pain/cramps. Denies itching, rash, or wounds. Denies dizziness, headaches, numbness or seizures. Denies swollen lymph nodes or glands, denies easy bruising or bleeding. Denies anxiety, depression, confusion, or decreased concentration.  Physical Exam: BP 125/75 (BP Location: Left Arm, Patient Position: Sitting)   Pulse 83   Temp (!) 97.3 F (36.3 C) (Tympanic)   Resp 18   Ht '5\' 3"'  (1.6 m)   Wt 148 lb 3.2 oz (67.2 kg)   SpO2 96%   BMI 26.25 kg/m  General: Alert, oriented, no acute distress. HEENT: Normocephalic, atraumatic, sclera  anicteric. Chest: Unlabored breathing on room air. Abdomen: soft, nontender.  Normoactive bowel sounds.  No masses or hepatosplenomegaly appreciated. Well-healed scar. Extremities: Grossly normal range of motion.  Warm, well perfused.  No edema bilaterally. Skin: No rashes or lesions noted. Lymphatics: No cervical, supraclavicular, or inguinal adenopathy. GU: Normal appearing external genitalia without erythema, excoriation, or lesions.  Speculum exam reveals mildly atrophic vaginal mucosa. No masses, bleeding or discharge.  Bimanual exam reveals no nodularity or masses. Rectovaginal exam  confirms these findings.  Laboratory & Radiologic Studies: None new  Assessment & Plan: [name] [name] is a 70 y.o. woman with history of HGS ovarian cancer, treated initially in 2014 with optimal cytoreduction and adjuvant platinum-based chemotherapy, treated for localized right pelvic LN recurrence in 2018 with RT, with more imaging findings of indolent recurrent disease (omental).   Patient is overall doing well without symptoms related to  cancer.  She has no evidence of disease on exam today.     Patient saw Ned Card and Dr. Benay Spice today.  Plan is for repeat CT scan in approximately 2 months assess left upper abdominal lesions. We discussed that if CA-125 from today is significantly more elevated, she and I will discuss the utility of getting CT sooner.    We discussed her rectocele again today. She continues to be asymptomatic and thus does not need to evaluation or treatment.   Patient will continue to have versus visits every 3 months, alternating between myself and Dr. Benay Spice.  She is scheduled with him in August and with me in December. We discussed signs and symptoms that would be concerning for growing disease burden. I have asked her to call me if she develops any new or worsening symptoms before her next visit with me.  32 minutes of total time was spent for this patient encounter,  including preparation, face-to-face counseling with the patient and coordination of care, and documentation of the encounter.  Jeral Pinch, MD  Division of Gynecologic Oncology  Department of Obstetrics and Gynecology  [facility]

## 2021-03-18 ENCOUNTER — Telehealth: Payer: Self-pay | Admitting: Gynecologic Oncology

## 2021-03-18 LAB — CA 125: Cancer Antigen (CA) 125: 36.6 U/mL (ref 0.0–38.1)

## 2021-03-18 NOTE — Telephone Encounter (Signed)
Called the patient. Reviewed CA-125 from yesterday, basically unchanged from April. Given that she is asymptomatic, I recommend CT in 2 months as previously discussed with Dr. Benay Spice.  Jeral Pinch MD Gynecologic Oncology

## 2021-03-19 ENCOUNTER — Other Ambulatory Visit: Payer: Self-pay | Admitting: *Deleted

## 2021-03-19 DIAGNOSIS — C569 Malignant neoplasm of unspecified ovary: Secondary | ICD-10-CM

## 2021-03-19 NOTE — Progress Notes (Signed)
Added CMP to lab appointment for CT scan in August.

## 2021-03-27 ENCOUNTER — Encounter: Payer: Self-pay | Admitting: Family Medicine

## 2021-03-27 ENCOUNTER — Other Ambulatory Visit: Payer: Self-pay

## 2021-03-27 ENCOUNTER — Ambulatory Visit (INDEPENDENT_AMBULATORY_CARE_PROVIDER_SITE_OTHER): Payer: Medicare Other | Admitting: Family Medicine

## 2021-03-27 VITALS — BP 118/78 | HR 82 | Temp 97.0°F | Ht 63.0 in | Wt 147.4 lb

## 2021-03-27 DIAGNOSIS — J019 Acute sinusitis, unspecified: Secondary | ICD-10-CM | POA: Insufficient documentation

## 2021-03-27 DIAGNOSIS — L602 Onychogryphosis: Secondary | ICD-10-CM

## 2021-03-27 DIAGNOSIS — J0100 Acute maxillary sinusitis, unspecified: Secondary | ICD-10-CM | POA: Diagnosis not present

## 2021-03-27 DIAGNOSIS — S40861A Insect bite (nonvenomous) of right upper arm, initial encounter: Secondary | ICD-10-CM

## 2021-03-27 DIAGNOSIS — W57XXXA Bitten or stung by nonvenomous insect and other nonvenomous arthropods, initial encounter: Secondary | ICD-10-CM

## 2021-03-27 MED ORDER — FLUTICASONE PROPIONATE 50 MCG/ACT NA SUSP: 2.0000 | Freq: Every day | NASAL | 1 refills | Status: DC

## 2021-03-27 MED ORDER — AMOXICILLIN-POT CLAVULANATE 875-125 MG PO TABS: 1.0000 | ORAL_TABLET | Freq: Two times a day (BID) | ORAL | 0 refills | Status: DC

## 2021-03-27 NOTE — Progress Notes (Signed)
Subjective:    Patient ID: [name] [name], female    DOB: 11/05/1950, 70 y.o.   MRN: [id number]  This visit occurred during the SARS-CoV-2 public health emergency.  Safety protocols were in place, including screening questions prior to the visit, additional usage of staff PPE, and extensive cleaning of exam room while observing appropriate contact time as indicated for disinfecting solutions.   HPI 70 yo pt of NP Baity presents with cough and nasal congestion for 2 wk   Wt Readings from Last 3 Encounters:  03/27/21 147 lb 7 oz (66.9 kg)  03/17/21 148 lb 3.2 oz (67.2 kg)  03/17/21 151 lb (68.5 kg)   26.12 kg/m  Covid test negative at home  3 wk- scratchy throat  Works outside and has allergies   Still clearing throat -mucous Blows nose-some congestion  Mucous is yellow /light and thick Ears feel stopped up  L nostril and ear are worse   No headache   Not coughing-just to clear her throat   Otc Nyquil Robitussin  No nasal spray   Small tick bite on R upper arm  No rash  Had a white spot on it  Was itchy  Not engorged   Big toe nails are thick and discolored   Patient Active Problem List   Diagnosis Date Noted   Acute sinusitis 03/27/2021   Tick bite of right upper arm 03/27/2021   Hypertrophic toenail 03/27/2021   Medicare annual wellness visit, subsequent 02/11/2021   Routine general medical examination at a health care facility 02/11/2021   Colon polyp 02/11/2021   Tick bite of back 02/11/2021   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   Osteopenia 04/30/2019   IBS (irritable bowel syndrome) 04/30/2019   Ovarian cancer ([place]) 08/23/2012   Rheumatoid arthritis ([place])    HYPERCHOLESTEROLEMIA 05/15/2008   Anxiety and depression 05/15/2008   Past Medical History:  Diagnosis Date   Anxiety and depression     DJD (degenerative joint disease)    Elevated cholesterol    Endometrial polyp    Family history of bladder  cancer    Family history of lymphoma    History of radiation therapy 10/25/16-12/01/16   right pelvis/60.2 Gy in 28 fractions   IRRITABLE BOWEL SYNDROME, HX OF 05/15/2008   Kidney stone    MVP (mitral valve prolapse)    occ palpitations    Ovarian ca ([place]) 07/2012       Rheumatoid arthritis(714.0) ~ 2010   dr Ouida Sills   Shingles    Past Surgical History:  Procedure Laterality Date   ABDOMINAL HYSTERECTOMY  08/22/2012   Procedure: HYSTERECTOMY ABDOMINAL;  Surgeon: Alvino Chapel, MD;  Location: [facility];  Service: Gynecology;  Laterality: N/A;   BACK SURGERY  10/04/2006   Dr Trenton Gammon   COLONOSCOPY  04/20/2004   COLONOSCOPY W/ BIOPSIES  07/04/2014   COLOSTOMY REVISION  08/22/2012   Procedure: COLON RESECTION SIGMOID;  Surgeon: Alvino Chapel, MD;  Location: [facility];  Service: Gynecology;;  Rectal Sigmoid resection and low rectal anastomosis   CYST ON NECK  10/04/2009   DEBULKING  08/22/2012   Procedure: DEBULKING;  Surgeon: Alvino Chapel, MD;  Location: [facility];  Service: Gynecology;  Laterality: N/A;  Radical tumor debulking, Bilateral Ureterolysis   DILATION AND CURETTAGE OF UTERUS  10/04/2002   WITH HYSTEROSCOPY   HAND SURGERY  10/04/1994   HYSTEROSCOPY  10/04/2002   D&C   KIDNEY STONE SURGERY Right  with stent placement  Mar 03 2021   NECK SURGERY  10/05/2007   SPURS   OMENTECTOMY  08/22/2012   Procedure: OMENTECTOMY;  Surgeon: Alvino Chapel, MD;  Location: [facility];  Service: Gynecology;  Laterality: N/A;   SALPINGOOPHORECTOMY  08/22/2012   Procedure: SALPINGO OOPHORECTOMY;  Surgeon: Alvino Chapel, MD;  Location: [facility];  Service: Gynecology;  Laterality: Bilateral;   TUBAL LIGATION  10/04/1978   Social History   Tobacco Use   Smoking status: Never   Smokeless tobacco: Never  Vaping Use   Vaping Use: Never used  Substance Use Topics   Alcohol use: No   Drug use: No   Family History  Problem Relation Age of Onset    Heart disease Mother    Bladder Cancer Father 59   Lymphoma Paternal Aunt    Osteoporosis Sister    Colon cancer Neg Hx    Rectal cancer Neg Hx    Stomach cancer Neg Hx    Diabetes Neg Hx    Stroke Neg Hx    Allergies  Allergen Reactions   Atorvastatin Other (See Comments)    Leg pain   Macrodantin Other (See Comments)    Unknown Reaction   Decongest-Aid [Pseudoephedrine] Palpitations   Current Outpatient Medications on File Prior to Visit  Medication Sig Dispense Refill   fish oil-omega-3 fatty acids 1000 MG capsule Take 1 g by mouth daily.     Multiple Vitamin (MULTIVITAMIN WITH MINERALS) TABS Take 1 tablet by mouth daily.     Red Yeast Rice Extract 600 MG CAPS Take 1 capsule by mouth daily.     No current facility-administered medications on file prior to visit.     Review of Systems  Constitutional:  Negative for activity change, appetite change, fatigue, fever and unexpected weight change.  HENT:  Positive for postnasal drip and sinus pressure. Negative for congestion, ear pain, facial swelling, rhinorrhea, sore throat and voice change.   Eyes:  Negative for pain, redness and visual disturbance.  Respiratory:  Negative for cough, shortness of breath and wheezing.   Cardiovascular:  Negative for chest pain and palpitations.  Gastrointestinal:  Negative for abdominal pain, blood in stool, constipation and diarrhea.  Endocrine: Negative for polydipsia and polyuria.  Genitourinary:  Negative for dysuria, frequency and urgency.  Musculoskeletal:  Negative for arthralgias, back pain and myalgias.  Skin:  Negative for pallor and rash.       Tick bite R arm itches  Thick big toenails- ? Fungus   Allergic/Immunologic: Negative for environmental allergies.  Neurological:  Negative for dizziness, syncope and headaches.  Hematological:  Negative for adenopathy. Does not bruise/bleed easily.  Psychiatric/Behavioral:  Negative for decreased concentration and dysphoric mood. The  patient is not nervous/anxious.       Objective:   Physical Exam Constitutional:      General: She is not in acute distress.    Appearance: Normal appearance. She is well-developed and normal weight. She is not ill-appearing.  HENT:     Head: Normocephalic and atraumatic.     Comments: No sinus tenderness    Right Ear: Tympanic membrane and external ear normal.     Left Ear: Tympanic membrane and external ear normal.     Ears:     Comments: Scant cerumen    Nose: Congestion and rhinorrhea present.     Mouth/Throat:     Pharynx: Oropharynx is clear. No oropharyngeal exudate or posterior oropharyngeal erythema.     Comments: Clear  pnd Eyes:     General:        Right eye: No discharge.        Left eye: No discharge.     Conjunctiva/sclera: Conjunctivae normal.     Pupils: Pupils are equal, round, and reactive to light.  Cardiovascular:     Rate and Rhythm: Normal rate and regular rhythm.  Pulmonary:     Effort: Pulmonary effort is normal. No respiratory distress.     Breath sounds: Normal breath sounds. No wheezing or rales.     Comments: Good air exch No rales or rhonchi Musculoskeletal:     Cervical back: Normal range of motion and neck supple.  Lymphadenopathy:     Cervical: No cervical adenopathy.  Skin:    General: Skin is warm and dry.     Findings: No rash.     Comments:  1 cm area of erythema and induration on R upper arm w/o target shape Small scab in middle of it  No remaining tick parts No rash  Great toe nails are thickened and yellow    Neurological:     Mental Status: She is alert.     Cranial Nerves: No cranial nerve deficit.     Coordination: Coordination normal.  Psychiatric:        Mood and Affect: Mood normal.          Assessment & Plan:   Problem List Items Addressed This Visit       Respiratory   Acute sinusitis - Primary    S/p uri with some likely allergic rhinitis  covid neg  Purulent nasal drainage and persistent pnd  tx with  augmentin  flonase for 2 or more weeks sympt care Update if not starting to improve in a week or if worsening         Relevant Medications   amoxicillin-clavulanate (AUGMENTIN) 875-125 MG tablet   fluticasone (FLONASE) 50 MCG/ACT nasal spray     Musculoskeletal and Integument   Tick bite of right upper arm    Small area of erythema and induration and no target shape or rash or other tick illness symptoms  Adv keeping clean and cortisone for itch  Disc s/s to watch for for tick symptoms such as rash/fever/ change in bite appearance       Hypertrophic toenail    Great toe nails on both feet are thick and discolored  Strongly suspect fungal  Recommend f/u with podiatry to debride and treat  Pt will make her own appt

## 2021-03-27 NOTE — Assessment & Plan Note (Signed)
Great toe nails on both feet are thick and discolored  Strongly suspect fungal  Recommend f/u with podiatry to debride and treat  Pt will make her own appt

## 2021-03-27 NOTE — Patient Instructions (Signed)
Drink fluids Take the augmentin  Use flonase nasal spray daily for at least 2 weeks   Update if not starting to improve in a week or if worsening    Watch the tick bite Over the counter cortisone cream will help tick bite  For toe nails- I recommend a podiatrist  Dr Milinda Pointer

## 2021-03-27 NOTE — Assessment & Plan Note (Signed)
S/p uri with some likely allergic rhinitis  covid neg  Purulent nasal drainage and persistent pnd  tx with augmentin  flonase for 2 or more weeks sympt care Update if not starting to improve in a week or if worsening

## 2021-03-27 NOTE — Assessment & Plan Note (Signed)
Small area of erythema and induration and no target shape or rash or other tick illness symptoms  Adv keeping clean and cortisone for itch  Disc s/s to watch for for tick symptoms such as rash/fever/ change in bite appearance

## 2021-04-23 ENCOUNTER — Other Ambulatory Visit: Payer: Self-pay | Admitting: Internal Medicine

## 2021-04-23 ENCOUNTER — Other Ambulatory Visit: Payer: Self-pay | Admitting: Gynecologic Oncology

## 2021-04-23 DIAGNOSIS — Z1231 Encounter for screening mammogram for malignant neoplasm of breast: Secondary | ICD-10-CM

## 2021-04-27 ENCOUNTER — Ambulatory Visit
Admission: RE | Admit: 2021-04-27 | Discharge: 2021-04-27 | Disposition: A | Discharge status: Home / Self Care | Payer: Medicare Other | Source: Ambulatory Visit | Attending: Gynecologic Oncology | Admitting: Gynecologic Oncology

## 2021-04-27 ENCOUNTER — Other Ambulatory Visit: Payer: Self-pay

## 2021-04-27 DIAGNOSIS — Z1231 Encounter for screening mammogram for malignant neoplasm of breast: Secondary | ICD-10-CM

## 2021-04-29 ENCOUNTER — Telehealth: Payer: Self-pay | Admitting: Oncology

## 2021-04-29 NOTE — Telephone Encounter (Signed)
Left a message regarding mammogram results.  Requested a return call.

## 2021-05-11 ENCOUNTER — Other Ambulatory Visit: Payer: Medicare Other

## 2021-05-11 ENCOUNTER — Inpatient Hospital Stay: Payer: Medicare Other | Attending: Oncology

## 2021-05-11 ENCOUNTER — Other Ambulatory Visit: Payer: Self-pay

## 2021-05-11 DIAGNOSIS — C569 Malignant neoplasm of unspecified ovary: Secondary | ICD-10-CM | POA: Diagnosis not present

## 2021-05-11 DIAGNOSIS — D701 Agranulocytosis secondary to cancer chemotherapy: Secondary | ICD-10-CM | POA: Diagnosis not present

## 2021-05-11 LAB — CMP (CANCER CENTER ONLY)
ALT: 20 U/L (ref 0–44)
AST: 18 U/L (ref 15–41)
Albumin: 4 g/dL (ref 3.5–5.0)
Alkaline Phosphatase: 51 U/L (ref 38–126)
Anion gap: 8 (ref 5–15)
BUN: 28 mg/dL — ABNORMAL HIGH (ref 8–23)
CO2: 24 mmol/L (ref 22–32)
Calcium: 9 mg/dL (ref 8.9–10.3)
Chloride: 106 mmol/L (ref 98–111)
Creatinine: 0.89 mg/dL (ref 0.44–1.00)
GFR, Estimated: >60 mL/min (ref >60)
Glucose, Bld: 94 mg/dL (ref 70–99)
Potassium: 4 mmol/L (ref 3.5–5.1)
Sodium: 138 mmol/L (ref 135–145)
Total Bilirubin: 0.5 mg/dL (ref 0.3–1.2)
Total Protein: 6.3 g/dL — ABNORMAL LOW (ref 6.5–8.1)

## 2021-05-12 LAB — CA 125: Cancer Antigen (CA) 125: 35.5 U/mL (ref 0.0–38.1)

## 2021-05-13 ENCOUNTER — Inpatient Hospital Stay: Payer: Medicare Other

## 2021-05-13 ENCOUNTER — Ambulatory Visit (HOSPITAL_BASED_OUTPATIENT_CLINIC_OR_DEPARTMENT_OTHER)
Admission: RE | Admit: 2021-05-13 | Discharge: 2021-05-13 | Disposition: A | Discharge status: Home / Self Care | Payer: Medicare Other | Source: Ambulatory Visit | Attending: Nurse Practitioner | Admitting: Nurse Practitioner

## 2021-05-13 ENCOUNTER — Other Ambulatory Visit: Payer: Self-pay

## 2021-05-13 DIAGNOSIS — C569 Malignant neoplasm of unspecified ovary: Secondary | ICD-10-CM | POA: Diagnosis not present

## 2021-05-13 DIAGNOSIS — C786 Secondary malignant neoplasm of retroperitoneum and peritoneum: Secondary | ICD-10-CM | POA: Diagnosis not present

## 2021-05-13 MED ORDER — IOHEXOL 300 MG/ML  SOLN
80.0000 mL | Freq: Once | INTRAMUSCULAR | Status: AC | PRN
  Administered 2021-05-13: 80 mL via INTRAVENOUS

## 2021-05-14 ENCOUNTER — Inpatient Hospital Stay: Payer: Medicare Other | Admitting: Oncology

## 2021-05-14 VITALS — BP 125/79 | HR 80 | Temp 97.8°F | Resp 18 | Ht 63.0 in | Wt 146.4 lb

## 2021-05-14 DIAGNOSIS — C569 Malignant neoplasm of unspecified ovary: Secondary | ICD-10-CM

## 2021-05-14 DIAGNOSIS — D701 Agranulocytosis secondary to cancer chemotherapy: Secondary | ICD-10-CM | POA: Diagnosis not present

## 2021-05-14 NOTE — Progress Notes (Signed)
[facility] OFFICE PROGRESS NOTE   Diagnosis: Ovarian cancer  INTERVAL HISTORY:   [name] [name] returns as scheduled.  She feels well.  No difficulty with bowel function.  No pain.  Objective:  Vital signs in last 24 hours:  Blood pressure 125/79, pulse 80, temperature 97.8 F (36.6 C), temperature source Oral, resp. rate 18, height '5\' 3"'  (1.6 m), weight 146 lb 6.4 oz (66.4 kg), SpO2 97 %.    Lymphatics: No cervical, supraclavicular, axillary, or inguinal nodes Resp: Lungs clear bilaterally Cardio: Regular rate and rhythm GI: Nondistended, no mass, nontender Vascular: No leg edema   Lab Results:  Lab Results  Component Value Date   WBC 6.1 02/11/2021   HGB 13.9 02/11/2021   HCT 41.4 02/11/2021   MCV 97.3 02/11/2021   PLT 254.0 02/11/2021   NEUTROABS 4.3 02/11/2021    CMP  Lab Results  Component Value Date   NA 138 05/11/2021   K 4.0 05/11/2021   CL 106 05/11/2021   CO2 24 05/11/2021   GLUCOSE 94 05/11/2021   BUN 28 (H) 05/11/2021   CREATININE 0.89 05/11/2021   CALCIUM 9.0 05/11/2021   PROT 6.3 (L) 05/11/2021   ALBUMIN 4.0 05/11/2021   AST 18 05/11/2021   ALT 20 05/11/2021   ALKPHOS 51 05/11/2021   BILITOT 0.5 05/11/2021   GFRNONAA >60 05/11/2021   GFRAA >60 03/13/2020    Lab Results  Component Value Date   CEA 0.8 07/13/2012   CA125 8 08/18/2016    Imaging:  CT Abdomen Pelvis W Contrast  Result Date: 05/13/2021 CLINICAL DATA:  Ovarian cancer. High-grade serous carcinoma of the ovary status post optimal debulking with rectosigmoid resection. Total omentectomy. Hysterectomy and bilateral salpingo-oophorectomy 2013. EXAM: CT ABDOMEN AND PELVIS WITH CONTRAST TECHNIQUE: Multidetector CT imaging of the abdomen and pelvis was performed using the standard protocol following bolus administration of intravenous contrast. CONTRAST:  52m OMNIPAQUE IOHEXOL 300 MG/ML  SOLN COMPARISON:  CT 01/13/2021 FINDINGS: Lower chest: Lung bases are clear.  Hepatobiliary: No suspicious hepatic lesion.  Gallbladder normal. Pancreas: Pancreas is normal. No ductal dilatation. No pancreatic inflammation. Spleen: Normal spleen Adrenals/urinary tract: Adrenal glands and kidneys are normal. The ureters and bladder normal. Stomach/Bowel: Stomach, small bowel, appendix, and cecum are normal. The colon and rectosigmoid colon are normal. Vascular/Lymphatic: Abdominal aorta is normal caliber. No periportal or retroperitoneal adenopathy. No pelvic adenopathy. Reproductive: Post hysterectomy. Adnexa unremarkable no evidence of local recurrence in the pelvis. Other: Nodular omental thickening in the LEFT upper quadrant and LEFT mid abdomen again noted. Example omental mass in the gastrosplenic ligament measures 3.6 by 2.3 cm (image 20/series 2) compared to 2.9 by 1.9 cm. Adjacent LEFT upper quadrant omental nodule measuring 20 mm image 25/2 compares to 14 mm. Within the ventral peritoneal space LEFT of midline beneath the rectus muscle example nodule measures is 18 mm (image 39/series 2) compared to 8 mm Nodule in the gastrohepatic ligament measures 12 mm (image 22/2) unchanged. Nodular thickening along the peritoneal reflection in the RIGHT iliac fossa measuring 11 mm (58/2) increased from 6 mm on prior Musculoskeletal: No aggressive osseous lesion IMPRESSION: 1. Mild measurable progression of omental and peritoneal nodule metastasis predominantly in the LEFT upper quadrant and LEFT ventral peritoneal space. 2. No evidence of local recurrence in the pelvis. 3. No liver metastasis. 4. No free fluid the abdomen pelvis. Electronically Signed   By: SSuzy BouchardM.D.   On: 05/13/2021 22:00    Medications: I have reviewed the  patient's current medications.   Assessment/Plan:  1. 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 A 5 mm  nodules remain on the right diaphragm. - TumorNext paired germline/tumor analyses: No somatic variants detected, germline CHEK2      VUS      Cycle 1 of adjuvant Taxol/carboplatin chemotherapy initiated on 09/19/2012.   The CA 125 normalized.   She completed day 15 of cycle 6 on 02/06/2013.   Restaging CT evaluation 03/29/2013 showed no evidence of metastatic disease in the chest. There was marked improvement in appearance/resolution of previous described peritoneal/omental disease. There was no convincing evidence of residual disease. There was minimal increased density in the region of the omentum favored to be treatment-related. There was no pelvic adenopathy. CA125 3.2 on 02/19/2014. 08/18/2016 CA-125 8 CT abdomen/pelvis 08/25/2016-solitary new enlarged right external iliac lymph node measuring 2.2 cm. Biopsy 09/07/2016-adenocarcinoma consistent with high-grade serous carcinoma. PET scan 09/22/2016-malignant range FDG uptake associated with the enlarged right external iliac lymph node compatible with metastatic adenopathy.  No additional sites of metastatic disease identified. Radiation 10/25/2016-12/01/2016 PET scan 03/28/2017-previously enlarged hypermetabolic right external iliac node-normal in size with resolution of hypermetabolic activity, no active malignancy identified PET scan 05/15/2018-no evidence of recurrent or metastatic disease, no hypermetabolic lymph nodes CT abdomen/pelvis 09/19/2019-mild increase in the size of several left upper quadrant peritoneal nodules measuring up to 11 mm.  No other sites of metastatic disease identified.  No ascites. CT abdomen/pelvis 03/14/2020-slight enlargement of several left upper quadrant peritoneal nodules, no ascites, no other evidence of disease progression CT abdomen/pelvis 09/12/2020 peritoneal nodularity/omental caking predominantly in the left upper/mid abdomen, mildly progressive.  Largest implant in the left upper abdomen adjacent to the  stomach now measures 17 mm, previously 13 mm.  Overall volume of peritoneal disease has mildly progressed. CT abdomen/pelvis 01/13/2021- 4 mm right ureteral calculus with moderate right hydronephrosis and upper right hydroureter, progressive omental nodularity, stable calcified and partially calcified right pelvic nodules CT abdomen/pelvis 05/13/2021-enlargement of left upper quadrant omental nodules and a peritoneal nodule at the right iliac fossa, no ascites 2. Low abdomen/suprapubic pain prior to the exploratory laparotomy-likely secondary to omental/pelvic tumor; persistent mild pain in the lower abdomen   3. Chronic neck and back pain.   4. Anxiety -persistent despite Lexapro and Xanax. She has been evaluated by psychiatry. Currently on Lexapro. 5. Status post Port-A-Cath placement 09/22/2012. The Port-A-Cath was removed on 04/03/2013.   6. Neutropenia secondary to chemotherapy- day 15 cycle 1 and cycle 3. Taxol/carboplatin not given secondary to neutropenia.    7. Herpes zoster involving a right thoracic dermatome July 2015. She completed a course of Valtrex. 8. Nodular bony prominence at the left pelvis on rectal exam 06/05/2015-likely a benign finding 9.  Right ureter stone/hydroureteronephrosis on CT 01/13/2021-referred to urology; status post right ureteroscopy with stone extraction and ureteral stent placement.  Stent removal 03/11/2021      Disposition: Ms. Tesar appears stable.  I reviewed the CT results and images with her.  She continues to have slow progression of the metastatic ovarian cancer.  She is asymptomatic.  We discussed treatment options.  She does not wish to begin systemic therapy at present.  She agrees to a follow-up visit and repeat CT in 3 months.  [name], MD  05/14/2021  10:54 AM

## 2021-06-01 ENCOUNTER — Encounter: Payer: Medicare Other | Admitting: Internal Medicine

## 2021-07-07 ENCOUNTER — Ambulatory Visit (INDEPENDENT_AMBULATORY_CARE_PROVIDER_SITE_OTHER): Payer: Medicare Other | Admitting: Nurse Practitioner

## 2021-07-07 ENCOUNTER — Other Ambulatory Visit: Payer: Self-pay

## 2021-07-07 ENCOUNTER — Encounter: Payer: Self-pay | Admitting: Nurse Practitioner

## 2021-07-07 VITALS — BP 122/60 | HR 76 | Temp 97.5°F | Resp 10 | Ht 63.25 in | Wt 149.6 lb

## 2021-07-07 DIAGNOSIS — C569 Malignant neoplasm of unspecified ovary: Secondary | ICD-10-CM

## 2021-07-07 DIAGNOSIS — D126 Benign neoplasm of colon, unspecified: Secondary | ICD-10-CM

## 2021-07-07 DIAGNOSIS — M069 Rheumatoid arthritis, unspecified: Secondary | ICD-10-CM

## 2021-07-07 DIAGNOSIS — L602 Onychogryphosis: Secondary | ICD-10-CM

## 2021-07-07 DIAGNOSIS — Z0000 Encounter for general adult medical examination without abnormal findings: Secondary | ICD-10-CM

## 2021-07-07 DIAGNOSIS — E785 Hyperlipidemia, unspecified: Secondary | ICD-10-CM

## 2021-07-07 LAB — LIPID PANEL
Cholesterol: 224 mg/dL — ABNORMAL HIGH (ref 0–200)
HDL: 50.9 mg/dL (ref >39.00)
LDL Cholesterol: 140 mg/dL — ABNORMAL HIGH (ref 0–99)
NonHDL: 173.48
Total CHOL/HDL Ratio: 4
Triglycerides: 169 mg/dL — ABNORMAL HIGH (ref 0.0–149.0)
VLDL: 33.8 mg/dL (ref 0.0–40.0)

## 2021-07-07 NOTE — Assessment & Plan Note (Signed)
Patient was told by her insurance she had not had a medical wellness exam yet this year presents for the same reviewed paperwork with patient inclusive of reviewing medical history, preventative exams, immunizations.  We will scanned paperwork to chart

## 2021-07-07 NOTE — Progress Notes (Signed)
Established Patient Office Visit  Subjective:  Patient ID: [name] [name], female    DOB: [birth year]  Age: 70 y.o. MRN: [id number]  CC:  Chief Complaint  Patient presents with   Medicare Wellness    HPI [name] [name] presents for [facility] wellness  Reviewed medical, surgical , and family history. Reviewed Medications with patient  Reviewed preventative health care with patient. Reviewed paper work and sent it to be scanned.  Hearing Screening   '500Hz'  '1000Hz'  '2000Hz'  '4000Hz'   Right ear '25 25 25 25  ' Left ear 25 0 25 0   Vision Screening   Right eye Left eye Both eyes  Without correction     With correction '20/40 20/30 20/25 '        Past Medical History:  Diagnosis Date   Anxiety and depression     DJD (degenerative joint disease)    Elevated cholesterol    Endometrial polyp    Family history of bladder cancer    Family history of lymphoma    History of radiation therapy 10/25/16-12/01/16   right pelvis/60.2 Gy in 28 fractions   IRRITABLE BOWEL SYNDROME, HX OF 05/15/2008   Kidney stone    MVP (mitral valve prolapse)    occ palpitations    Ovarian ca ([place]) 07/2012       Rheumatoid arthritis(714.0) ~ 2010   dr Ouida Sills   Shingles     Past Surgical History:  Procedure Laterality Date   ABDOMINAL HYSTERECTOMY  08/22/2012   Procedure: HYSTERECTOMY ABDOMINAL;  Surgeon: Alvino Chapel, MD;  Location: [facility];  Service: Gynecology;  Laterality: N/A;   BACK SURGERY  10/04/2006   Dr Trenton Gammon   COLONOSCOPY  04/20/2004   COLONOSCOPY W/ BIOPSIES  07/04/2014   COLOSTOMY REVISION  08/22/2012   Procedure: COLON RESECTION SIGMOID;  Surgeon: Alvino Chapel, MD;  Location: [facility];  Service: Gynecology;;  Rectal Sigmoid resection and low rectal anastomosis   CYST ON NECK  10/04/2009   DEBULKING  08/22/2012   Procedure: DEBULKING;  Surgeon: Alvino Chapel, MD;  Location: [facility];  Service: Gynecology;  Laterality: N/A;  Radical tumor debulking, Bilateral  Ureterolysis   DILATION AND CURETTAGE OF UTERUS  10/04/2002   WITH HYSTEROSCOPY   HAND SURGERY  10/04/1994   HYSTEROSCOPY  10/04/2002   D&C   KIDNEY STONE SURGERY Right    with stent placement  Mar 03 2021   NECK SURGERY  10/05/2007   SPURS   OMENTECTOMY  08/22/2012   Procedure: OMENTECTOMY;  Surgeon: Alvino Chapel, MD;  Location: [facility];  Service: Gynecology;  Laterality: N/A;   SALPINGOOPHORECTOMY  08/22/2012   Procedure: SALPINGO OOPHORECTOMY;  Surgeon: Alvino Chapel, MD;  Location: [facility];  Service: Gynecology;  Laterality: Bilateral;   TUBAL LIGATION  10/04/1978    Family History  Problem Relation Age of Onset   Heart disease Mother    Bladder Cancer Father 77   Lymphoma Paternal Aunt    Osteoporosis Sister    Colon cancer Neg Hx    Rectal cancer Neg Hx    Stomach cancer Neg Hx    Diabetes Neg Hx    Stroke Neg Hx     Social History   Socioeconomic History   Marital status: Married    Spouse name: Not on file   Number of children: 2   Years of education: Not on file   Highest education level: Not on file  Occupational History   Occupation: disability d/t RA  Tobacco Use   Smoking status: Never   Smokeless tobacco: Never  Vaping Use   Vaping Use: Never used  Substance and Sexual Activity   Alcohol use: No   Drug use: No   Sexual activity: Never    Birth control/protection: Post-menopausal, Surgical  Other Topics Concern   Not on file  Social History Narrative   Anne Ng Beulah Gandy is her daughter    Household-- pr and husband   Social Determinants of Radio broadcast assistant Strain: Not on file  Food Insecurity: Not on file  Transportation Needs: Not on file  Physical Activity: Not on file  Stress: Not on file  Social Connections: Not on file  Intimate Partner Violence: Not on file    Outpatient Medications Prior to Visit  Medication Sig Dispense Refill   fish oil-omega-3 fatty acids 1000 MG capsule Take 1 g by mouth daily.      fluticasone (FLONASE) 50 MCG/ACT nasal spray Place 2 sprays into both nostrils daily. 16 g 1   Multiple Vitamin (MULTIVITAMIN WITH MINERALS) TABS Take 1 tablet by mouth daily.     Red Yeast Rice Extract 600 MG CAPS Take 1 capsule by mouth daily.     amoxicillin-clavulanate (AUGMENTIN) 875-125 MG tablet Take 1 tablet by mouth 2 (two) times daily. 14 tablet 0   No facility-administered medications prior to visit.    Allergies  Allergen Reactions   Atorvastatin Other (See Comments)    Leg pain   Macrodantin Other (See Comments)    Unknown Reaction   Decongest-Aid [Pseudoephedrine] Palpitations    ROS Review of Systems  Constitutional:  Positive for fatigue. Negative for chills and fever.  Respiratory:  Negative for cough and shortness of breath.   Cardiovascular:  Negative for chest pain.  Gastrointestinal:  Negative for blood in stool, diarrhea, nausea and vomiting.  Genitourinary:  Negative for dysuria and hematuria.       Nocturia x 3  Musculoskeletal:  Positive for myalgias.     Objective:    Physical Exam Vitals and nursing note reviewed.  Constitutional:      Appearance: Normal appearance.  Cardiovascular:     Rate and Rhythm: Normal rate and regular rhythm.     Pulses:          Dorsalis pedis pulses are 1+ on the right side and 1+ on the left side.       Posterior tibial pulses are 1+ on the right side and 1+ on the left side.  Pulmonary:     Effort: Pulmonary effort is normal.     Breath sounds: Normal breath sounds.  Abdominal:     General: Bowel sounds are normal.  Skin:    General: Skin is warm.     Capillary Refill: Capillary refill takes less than 2 seconds.     Comments: Patient had left great toe nail hypertrophic and discolored. Left great toe nail looks to have broke off.     Neurological:     Mental Status: She is alert.  Psychiatric:        Mood and Affect: Mood normal.        Behavior: Behavior normal.        Thought Content: Thought content  normal.        Judgment: Judgment normal.    BP 122/60   Pulse 76   Temp (!) 97.5 F (36.4 C)   Resp 10   Ht 5' 3.25" (1.607 m)   Wt 149 lb 9  oz (67.8 kg)   SpO2 96%   BMI 26.28 kg/m  Wt Readings from Last 3 Encounters:  07/07/21 149 lb 9 oz (67.8 kg)  05/14/21 146 lb 6.4 oz (66.4 kg)  03/27/21 147 lb 7 oz (66.9 kg)     Health Maintenance Due  Topic Date Due   Zoster Vaccines- Shingrix (1 of 2) Never done   COLONOSCOPY (Pts 45-55yr Insurance coverage will need to be confirmed)  07/24/2019    There are no preventive care reminders to display for this patient.  Lab Results  Component Value Date   TSH 1.77 02/11/2021   Lab Results  Component Value Date   WBC 6.1 02/11/2021   HGB 13.9 02/11/2021   HCT 41.4 02/11/2021   MCV 97.3 02/11/2021   PLT 254.0 02/11/2021   Lab Results  Component Value Date   NA 138 05/11/2021   K 4.0 05/11/2021   CHLORIDE 105 09/20/2017   CO2 24 05/11/2021   GLUCOSE 94 05/11/2021   BUN 28 (H) 05/11/2021   CREATININE 0.89 05/11/2021   BILITOT 0.5 05/11/2021   ALKPHOS 51 05/11/2021   AST 18 05/11/2021   ALT 20 05/11/2021   PROT 6.3 (L) 05/11/2021   ALBUMIN 4.0 05/11/2021   CALCIUM 9.0 05/11/2021   ANIONGAP 8 05/11/2021   EGFR >60 09/20/2017   GFR 82.85 02/11/2021   Lab Results  Component Value Date   CHOL 219 (H) 02/11/2021   Lab Results  Component Value Date   HDL 45.80 02/11/2021   Lab Results  Component Value Date   LDLCALC 153 (H) 02/11/2021   Lab Results  Component Value Date   TRIG 98.0 02/11/2021   Lab Results  Component Value Date   CHOLHDL 5 02/11/2021   No results found for: HGBA1C    Assessment & Plan:   Problem List Items Addressed This Visit       Digestive   Colon polyp    Did have a polyp follow-up in 5 years referral placed by colleague in May 2022.        Endocrine   Ovarian cancer ([facility]    States originally diagnosed with ovarian cancer in 2013.  The chemo went to remission patient  states he returned to Dr. KTomi Bambergeryears later that she underwent radiation for back intervention and has returned again.  She was evaluated by GYN in the summer she wanted to hold off on treatment for now [facility]  She is also followed by Dr. BJulieanne Mansonwho is hematology/oncology.  Continue following up with specialist as recommended        Musculoskeletal and Integument   [place]    Historical diagnosis patient does have some deviations and PIP and DIP joints currently okay states she had seen a rheumatologist in the past and does not want any treatment currently continue monitoring.      Hypertrophic toenail - Primary   Relevant Orders   Ambulatory referral to Podiatry     Other   Medicare annual wellness visit, subsequent    Patient was told by her insurance she had not had a medical wellness exam yet this year presents for the same reviewed paperwork with patient inclusive of reviewing medical history, preventative exams, immunizations.  We will scanned paperwork to chart      Hyperlipidemia    History of the same patient is intolerant to atorvastatin.  Last LDL was  above goal but improved.  Recheck lipid panel today in office pending lab results      Relevant Orders   Lipid panel    No orders of the defined types were placed in this encounter.   Follow-up: Return in about 6 months (around 01/05/2022).   This visit occurred during the SARS-CoV-2 public health emergency.  Safety protocols were in place, including screening questions prior to the visit, additional usage of staff PPE, and extensive cleaning of exam room while observing appropriate contact time as indicated for disinfecting solutions.   Romilda Garret, NP

## 2021-07-07 NOTE — Assessment & Plan Note (Signed)
History of the same patient is intolerant to atorvastatin.  Last LDL was above goal but improved.  Recheck lipid panel today in office pending lab results

## 2021-07-07 NOTE — Assessment & Plan Note (Signed)
Did have a polyp follow-up in 5 years referral placed by colleague in May 2022.

## 2021-07-07 NOTE — Assessment & Plan Note (Signed)
States originally diagnosed with ovarian cancer in 2013.  The chemo went to remission patient states he returned to Dr. Tomi Bamberger years later that she underwent radiation for back intervention and has returned again.  She was evaluated by GYN in the summer she wanted to hold off on treatment for now has an appointment November with GYN Dr. [facility]lyn [facility].  She is also followed by Dr. Julieanne Manson who is hematology/oncology.  Continue following up with specialist as recommended

## 2021-07-07 NOTE — Patient Instructions (Signed)
Nice to see you today WIll see you in about 6 months.  Will refer you to podiatry

## 2021-07-07 NOTE — Assessment & Plan Note (Signed)
Historical diagnosis patient does have some deviations and PIP and DIP joints currently okay states she had seen a rheumatologist in the past and does not want any treatment currently continue monitoring.

## 2021-07-15 ENCOUNTER — Other Ambulatory Visit: Payer: Self-pay

## 2021-07-15 ENCOUNTER — Encounter: Payer: Self-pay | Admitting: Podiatry

## 2021-07-15 ENCOUNTER — Ambulatory Visit: Payer: Medicare Other | Admitting: Podiatry

## 2021-07-15 DIAGNOSIS — H524 Presbyopia: Secondary | ICD-10-CM | POA: Diagnosis not present

## 2021-07-15 DIAGNOSIS — H2513 Age-related nuclear cataract, bilateral: Secondary | ICD-10-CM | POA: Diagnosis not present

## 2021-07-15 DIAGNOSIS — L600 Ingrowing nail: Secondary | ICD-10-CM | POA: Diagnosis not present

## 2021-07-15 DIAGNOSIS — H5203 Hypermetropia, bilateral: Secondary | ICD-10-CM | POA: Diagnosis not present

## 2021-07-15 NOTE — Progress Notes (Signed)
Subjective:   Patient ID: [name] [name], female   DOB: 70 y.o.   MRN: [id number]   HPI Patient presents with severely damaged left hallux nail x2 years duration stating that she is tried to trim and soak it without relief and she needs something done due to the pain.  Patient does not smoke likes to be active   Review of Systems  All other systems reviewed and are negative.      Objective:  Physical Exam Vitals and nursing note reviewed.  Constitutional:      Appearance: She is well-developed.  Pulmonary:     Effort: Pulmonary effort is normal.  Musculoskeletal:        General: Normal range of motion.  Skin:    General: Skin is warm.  Neurological:     Mental Status: She is alert.    Neurovascular status found to be intact muscle strength was found to be adequate range of motion adequate with good digital perfusion well oriented x3.  Patient is noted to have a severely thickened dystrophic left hallux nail painful when pressed dorsally and on the right there is damage to the nailbed but nonpainful.  Patient has been dealing with this for a number of years     Assessment:  Chronic damaged hallux nail left that is not responded conservatively along with damaged right but not painful     Plan:  H&P reviewed condition recommended nail removal left.  Explained procedure risk patient wants surgery and is willing to accept risk and today I infiltrated the left hallux 60 mg like Marcaine mixture sterile prep done and using sterile instrumentation remove the left hallux nail exposed matrix applied phenol 5 applications 30 seconds followed by alcohol lavage sterile dressing and gave instructions on soaks.  Patient will be seen back to reevaluate will leave dressing on 24 hours take it off earlier if needed and is encouraged to call with questions concerns

## 2021-07-15 NOTE — Patient Instructions (Signed)

## 2021-08-10 DIAGNOSIS — M5451 Vertebrogenic low back pain: Secondary | ICD-10-CM | POA: Diagnosis not present

## 2021-08-12 DIAGNOSIS — Z20822 Contact with and (suspected) exposure to covid-19: Secondary | ICD-10-CM | POA: Diagnosis not present

## 2021-08-14 ENCOUNTER — Other Ambulatory Visit: Payer: Self-pay

## 2021-08-14 ENCOUNTER — Inpatient Hospital Stay: Payer: Medicare Other | Attending: Oncology

## 2021-08-14 ENCOUNTER — Ambulatory Visit (HOSPITAL_BASED_OUTPATIENT_CLINIC_OR_DEPARTMENT_OTHER)
Admission: RE | Admit: 2021-08-14 | Discharge: 2021-08-14 | Disposition: A | Discharge status: Home / Self Care | Payer: Medicare Other | Source: Ambulatory Visit | Attending: Oncology | Admitting: Oncology

## 2021-08-14 DIAGNOSIS — I700 Atherosclerosis of aorta: Secondary | ICD-10-CM | POA: Insufficient documentation

## 2021-08-14 DIAGNOSIS — C569 Malignant neoplasm of unspecified ovary: Secondary | ICD-10-CM | POA: Diagnosis not present

## 2021-08-14 DIAGNOSIS — D701 Agranulocytosis secondary to cancer chemotherapy: Secondary | ICD-10-CM | POA: Insufficient documentation

## 2021-08-14 DIAGNOSIS — C786 Secondary malignant neoplasm of retroperitoneum and peritoneum: Secondary | ICD-10-CM | POA: Insufficient documentation

## 2021-08-14 LAB — BASIC METABOLIC PANEL - CANCER CENTER ONLY
Anion gap: 10 (ref 5–15)
BUN: 19 mg/dL (ref 8–23)
CO2: 28 mmol/L (ref 22–32)
Calcium: 9.3 mg/dL (ref 8.9–10.3)
Chloride: 101 mmol/L (ref 98–111)
Creatinine: 0.77 mg/dL (ref 0.44–1.00)
GFR, Estimated: >60 mL/min (ref >60)
Glucose, Bld: 82 mg/dL (ref 70–99)
Potassium: 4.1 mmol/L (ref 3.5–5.1)
Sodium: 139 mmol/L (ref 135–145)

## 2021-08-14 MED ORDER — IOHEXOL 300 MG/ML  SOLN
100.0000 mL | Freq: Once | INTRAMUSCULAR | Status: AC | PRN
  Administered 2021-08-14: 100 mL via INTRAVENOUS

## 2021-08-15 LAB — CA 125: Cancer Antigen (CA) 125: 48.3 U/mL — ABNORMAL HIGH (ref 0.0–38.1)

## 2021-08-17 ENCOUNTER — Inpatient Hospital Stay: Payer: Medicare Other | Admitting: Oncology

## 2021-08-17 ENCOUNTER — Other Ambulatory Visit: Payer: Self-pay

## 2021-08-17 ENCOUNTER — Telehealth: Payer: Self-pay | Admitting: *Deleted

## 2021-08-17 VITALS — BP 134/81 | HR 77 | Temp 97.8°F | Resp 18 | Ht 62.0 in | Wt 148.0 lb

## 2021-08-17 DIAGNOSIS — Z7189 Other specified counseling: Secondary | ICD-10-CM

## 2021-08-17 DIAGNOSIS — C569 Malignant neoplasm of unspecified ovary: Secondary | ICD-10-CM

## 2021-08-17 DIAGNOSIS — D701 Agranulocytosis secondary to cancer chemotherapy: Secondary | ICD-10-CM | POA: Diagnosis not present

## 2021-08-17 DIAGNOSIS — Z0000 Encounter for general adult medical examination without abnormal findings: Secondary | ICD-10-CM | POA: Insufficient documentation

## 2021-08-17 MED ORDER — PROCHLORPERAZINE MALEATE 10 MG PO TABS: 10.0000 mg | ORAL_TABLET | Freq: Four times a day (QID) | ORAL | 1 refills | Status: DC | PRN

## 2021-08-17 MED ORDER — ONDANSETRON HCL 8 MG PO TABS: 8.0000 mg | ORAL_TABLET | Freq: Three times a day (TID) | ORAL | 1 refills | Status: DC | PRN

## 2021-08-17 MED ORDER — DEXAMETHASONE 2 MG PO TABS: ORAL_TABLET | ORAL | 0 refills | Status: DC

## 2021-08-17 MED ORDER — LIDOCAINE-PRILOCAINE 2.5-2.5 % EX CREA: 1.0000 "application " | TOPICAL_CREAM | CUTANEOUS | 1 refills | Status: DC

## 2021-08-17 NOTE — Telephone Encounter (Signed)
Port placement scheduled for [facility] on 09/01/21 with arrival in admitting at 0930. Nothing to eat or drink after midnight the night prior and will need driver.  Left above message on VM and via MyChart w/request for confirmation. Also requesting confirmation on which pharmacy she wants EMLA, compazine and zofran sent. Also noted she is to not begin EMLA use until 14 days after port is placed. We will use ice to numb site with first treatment.

## 2021-08-17 NOTE — Progress Notes (Signed)
[place] OFFICE PROGRESS NOTE   Diagnosis: Ovarian cancer  INTERVAL HISTORY:   [name] [name] returns as scheduled.  She feels well.  She has occasional transient abdominal discomfort.  No consistent pain.  No difficulty with bowel function.  Objective:  Vital signs in last 24 hours:  Blood pressure 134/81, pulse 77, temperature 97.8 F (36.6 C), temperature source Oral, resp. rate 18, height _0  (1.575 m), weight 148 lb (67.1 kg), SpO2 100 %.    Resp: Lungs clear bilaterally Cardio: Regular rate and rhythm GI: No hepatosplenomegaly, no apparent ascites, no mass, nontender Vascular: No leg edema      Lab Results:  Lab Results  Component Value Date   WBC 6.1 02/11/2021   HGB 13.9 02/11/2021   HCT 41.4 02/11/2021   MCV 97.3 02/11/2021   PLT 254.0 02/11/2021   NEUTROABS 4.3 02/11/2021    CMP  Lab Results  Component Value Date   NA 139 08/14/2021   K 4.1 08/14/2021   CL 101 08/14/2021   CO2 28 08/14/2021   GLUCOSE 82 08/14/2021   BUN 19 08/14/2021   CREATININE 0.77 08/14/2021   CALCIUM 9.3 08/14/2021   PROT 6.3 (L) 05/11/2021   ALBUMIN 4.0 05/11/2021   AST 18 05/11/2021   ALT 20 05/11/2021   ALKPHOS 51 05/11/2021   BILITOT 0.5 05/11/2021   GFRNONAA >60 08/14/2021   GFRAA >60 03/13/2020    Lab Results  Component Value Date   CEA 0.8 07/13/2012   CA125 8 08/18/2016      Imaging:  CT ABDOMEN PELVIS W CONTRAST  Result Date: 08/14/2021 CLINICAL DATA:  Follow-up ovarian carcinoma. EXAM: CT ABDOMEN AND PELVIS WITH CONTRAST TECHNIQUE: Multidetector CT imaging of the abdomen and pelvis was performed using the standard protocol following bolus administration of intravenous contrast. CONTRAST:  164m OMNIPAQUE IOHEXOL 300 MG/ML  SOLN COMPARISON:  05/13/2021 FINDINGS: Lower Chest: No acute findings. Hepatobiliary: No hepatic masses identified. Stable tiny sub-cm cyst in the inferior right hepatic lobe. Gallbladder is unremarkable. No evidence of  biliary ductal dilatation. Pancreas:  No mass or inflammatory changes. Spleen: Within normal limits in size and appearance. Adrenals/Urinary Tract: No masses identified. No evidence of ureteral calculi or hydronephrosis. Stomach/Bowel: No evidence of obstruction, inflammatory process or abnormal fluid collections. Vascular/Lymphatic: No pathologically enlarged lymph nodes. No acute vascular findings. Aortic atherosclerotic calcification noted. Reproductive: Prior hysterectomy. Soft tissue caking in the omentum shows mild increase since prior exam, with index nodule in the anterior left abdomen measuring 2.6 x 2.3 cm on image 38/2, compared to 1.8 x 1.5 cm previously. Another omental soft tissue nodule in the right upper quadrant measures 3.5 x 2.2 cm on image 30/2, compared to 2.0 x 1.2 cm previously. Diffuse peritoneal thickening in the mesentery shows no significant change. No evidence of ascites. Other:  None. Musculoskeletal:  No suspicious bone lesions identified. IMPRESSION: Mild increase in omental carcinomatosis since prior exam. No evidence of ascites. No new sites of metastatic disease identified. Aortic Atherosclerosis (ICD10-I70.0). Electronically Signed   By: JMarlaine HindM.D.   On: 08/14/2021 18:21    Medications: I have reviewed the patient's current medications.   Assessment/Plan:  1. 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 A 5 mm nodules remain on the right diaphragm. - TumorNext paired germline/tumor analyses: No somatic variants detected, germline CHEK2      VUS  Cycle 1 of adjuvant Taxol/carboplatin chemotherapy initiated on 09/19/2012.   The CA 125 normalized.   She completed day 15 of cycle 6 on 02/06/2013.   Restaging CT evaluation 03/29/2013 showed no evidence of metastatic disease in the chest. There was marked improvement  in appearance/resolution of previous described peritoneal/omental disease. There was no convincing evidence of residual disease. There was minimal increased density in the region of the omentum favored to be treatment-related. There was no pelvic adenopathy. CA125 3.2 on 02/19/2014. 08/18/2016 CA-125 8 CT abdomen/pelvis 08/25/2016-solitary new enlarged right external iliac lymph node measuring 2.2 cm. Biopsy 09/07/2016-adenocarcinoma consistent with high-grade serous carcinoma. PET scan 09/22/2016-malignant range FDG uptake associated with the enlarged right external iliac lymph node compatible with metastatic adenopathy.  No additional sites of metastatic disease identified. Radiation 10/25/2016-12/01/2016 PET scan 03/28/2017-previously enlarged hypermetabolic right external iliac node-normal in size with resolution of hypermetabolic activity, no active malignancy identified PET scan 05/15/2018-no evidence of recurrent or metastatic disease, no hypermetabolic lymph nodes CT abdomen/pelvis 09/19/2019-mild increase in the size of several left upper quadrant peritoneal nodules measuring up to 11 mm.  No other sites of metastatic disease identified.  No ascites. CT abdomen/pelvis 03/14/2020-slight enlargement of several left upper quadrant peritoneal nodules, no ascites, no other evidence of disease progression CT abdomen/pelvis 09/12/2020 peritoneal nodularity/omental caking predominantly in the left upper/mid abdomen, mildly progressive.  Largest implant in the left upper abdomen adjacent to the stomach now measures 17 mm, previously 13 mm.  Overall volume of peritoneal disease has mildly progressed. CT abdomen/pelvis 01/13/2021- 4 mm right ureteral calculus with moderate right hydronephrosis and upper right hydroureter, progressive omental nodularity, stable calcified and partially calcified right pelvic nodules CT abdomen/pelvis 05/13/2021-enlargement of left upper quadrant omental nodules and a peritoneal  nodule at the right iliac fossa, no ascites CT abdomen/pelvis 08/14/2021-mild increase in size of omental nodules, no ascites, no new site of metastatic disease 2. Low abdomen/suprapubic pain prior to the exploratory laparotomy-likely secondary to omental/pelvic tumor; persistent mild pain in the lower abdomen   3. Chronic neck and back pain.   4. Anxiety -persistent despite Lexapro and Xanax. She has been evaluated by psychiatry. Currently on Lexapro. 5. Status post Port-A-Cath placement 09/22/2012. The Port-A-Cath was removed on 04/03/2013.   6. Neutropenia secondary to chemotherapy- day 15 cycle 1 and cycle 3. Taxol/carboplatin not given secondary to neutropenia.    7. Herpes zoster involving a right thoracic dermatome July 2015. She completed a course of Valtrex. 8. Nodular bony prominence at the left pelvis on rectal exam 06/05/2015-likely a benign finding 9.  Right ureter stone/hydroureteronephrosis on CT 01/13/2021-referred to urology; status post right ureteroscopy with stone extraction and ureteral stent placement.  Stent removal 03/11/2021     Disposition: Ms. Luthi has a history of ovarian cancer dating to November 2013.  The CA125 is mildly elevated.  There is been a mild increase in peritoneal nodules over the past 2 years.  I discussed treatment options with Ms. Cayabyab.  No therapy will be curative.  We discussed continued observation versus salvage systemic therapy.  She would like to begin treatment after the Thanksgiving holiday.  I recommend repeat treatment with Taxol/carboplatin.  She was treated with Taxol/carboplatin on a weekly schedule in 2013-2014.  She will be treated with the same regimen.  We will consider maintenance therapy after completion of several months of Taxol/carboplatin.  We reviewed potential toxicities associated with this regimen including the chance of an allergic reaction, hematologic toxicity, neuropathy, bone pain, and alopecia.  She agrees to  proceed.  Ms. Fagerstrom will be referred for Port-A-Cath placement with the plan to begin chemotherapy on 09/08/2021.  A chemotherapy plan was entered today. [name] Coder, MD  08/17/2021  10:01 AM

## 2021-08-17 NOTE — Progress Notes (Signed)
START OFF PATHWAY REGIMEN - Ovarian   [id number]:Carboplatin AUC=2 IV D1,8,15 + Paclitaxel 80 mg/m2 IV D1,8,15 q21 Days:   A cycle is every 21 days:     Paclitaxel      Carboplatin   **Always confirm dose/schedule in your pharmacy ordering system**  Patient Characteristics: Recurrent or Progressive Disease, Second Line, Platinum Sensitive and ? 6 Months Since Last Therapy, Not a Candidate for Secondary Debulking Surgery BRCA Mutation Status: Absent Therapeutic Status: Recurrent or Progressive Disease Line of Therapy: Second Line  Intent of Therapy: Non-Curative / Palliative Intent, Discussed with Patient

## 2021-08-17 NOTE — Addendum Note (Signed)
Addended by: Tania Ade on: 08/17/2021 02:46 PM   Modules accepted: Orders

## 2021-08-17 NOTE — Telephone Encounter (Signed)
[name] [name] called back and confirmed port appointment and pharmacy as [facility] [address].

## 2021-08-19 ENCOUNTER — Encounter: Payer: Self-pay | Admitting: *Deleted

## 2021-08-19 NOTE — Progress Notes (Signed)
Notified by [employer] that EMLA cream approved from 08/19/21 to 08/19/22. [facility] notified.

## 2021-08-31 ENCOUNTER — Other Ambulatory Visit: Payer: Self-pay | Admitting: Radiology

## 2021-08-31 ENCOUNTER — Other Ambulatory Visit (HOSPITAL_COMMUNITY): Payer: Self-pay | Admitting: Physician Assistant

## 2021-08-31 NOTE — H&P (Signed)
Chief Complaint: Patient was seen in consultation today for image guided Port-A-Cath placement at the request of Sherrill,Gary B  Referring Physician(s): Ladell Pier  Supervising Physician: Arne Cleveland  Patient Status: [place]  History of Present Illness: [name] [name] is a 70 y.o. female with PMH of anxiety, depression, hypercholesteremia, rheumatoid arthritis and ovarian cancer.  Patient has history of ovarian cancer from November 2013.  Over the past 2 years that has been a mild increase in peritoneal nodules. Dr. Benay Spice has referred patient to IR for Port-A-Cath placement to begin treatment.   Past Medical History:  Diagnosis Date   Anxiety and depression     DJD (degenerative joint disease)    Elevated cholesterol    Endometrial polyp    Family history of bladder cancer    Family history of lymphoma    History of radiation therapy 10/25/16-12/01/16   right pelvis/60.2 Gy in 28 fractions   IRRITABLE BOWEL SYNDROME, HX OF 05/15/2008   Kidney stone    MVP (mitral valve prolapse)    occ palpitations    Ovarian ca ([place]) 07/2012       Rheumatoid arthritis(714.0) ~ 2010   dr Ouida Sills   Shingles     Past Surgical History:  Procedure Laterality Date   ABDOMINAL HYSTERECTOMY  08/22/2012   Procedure: HYSTERECTOMY ABDOMINAL;  Surgeon: Alvino Chapel, MD;  Location: [facility];  Service: Gynecology;  Laterality: N/A;   BACK SURGERY  10/04/2006   Dr Trenton Gammon   COLONOSCOPY  04/20/2004   COLONOSCOPY W/ BIOPSIES  07/04/2014   COLOSTOMY REVISION  08/22/2012   Procedure: COLON RESECTION SIGMOID;  Surgeon: Alvino Chapel, MD;  Location: [facility];  Service: Gynecology;;  Rectal Sigmoid resection and low rectal anastomosis   CYST ON NECK  10/04/2009   DEBULKING  08/22/2012   Procedure: DEBULKING;  Surgeon: Alvino Chapel, MD;  Location: [facility];  Service: Gynecology;  Laterality: N/A;  Radical tumor debulking, Bilateral Ureterolysis   DILATION  AND CURETTAGE OF UTERUS  10/04/2002   WITH HYSTEROSCOPY   HAND SURGERY  10/04/1994   HYSTEROSCOPY  10/04/2002   D&C   KIDNEY STONE SURGERY Right    with stent placement  Mar 03 2021   NECK SURGERY  10/05/2007   SPURS   OMENTECTOMY  08/22/2012   Procedure: OMENTECTOMY;  Surgeon: Alvino Chapel, MD;  Location: [facility];  Service: Gynecology;  Laterality: N/A;   SALPINGOOPHORECTOMY  08/22/2012   Procedure: SALPINGO OOPHORECTOMY;  Surgeon: Alvino Chapel, MD;  Location: [facility];  Service: Gynecology;  Laterality: Bilateral;   TUBAL LIGATION  10/04/1978    Allergies: Atorvastatin, Macrodantin, and Decongest-aid [pseudoephedrine]  Medications: Prior to Admission medications   Medication Sig Start Date End Date Taking? Authorizing Provider  dexamethasone (DECADRON) 2 MG tablet Take 10 mg (5 tablets) night before treatment. Take 10 mg (5 tablets) morning of treatment. 08/17/21   Ladell Pier, MD  fish oil-omega-3 fatty acids 1000 MG capsule Take 1 g by mouth daily.    [provider]  fluticasone (FLONASE) 50 MCG/ACT nasal spray Place 2 sprays into both nostrils daily. 03/27/21   Tower, Wynelle Fanny, MD  lidocaine-prilocaine (EMLA) cream Apply 1 application topically as directed. Apply 1/2 tablespoon to port site and cover with Press-and-Seal 2 hours prior to IV stick to numb site. Begin use 14 days after port is inserted. 08/17/21   Ladell Pier, MD  Multiple Vitamin (MULTIVITAMIN WITH MINERALS) TABS Take 1 tablet by  mouth daily.    [provider]  ondansetron (ZOFRAN) 8 MG tablet Take 1 tablet (8 mg total) by mouth every 8 (eight) hours as needed for nausea or vomiting. 08/17/21   Ladell Pier, MD  predniSONE (DELTASONE) 5 MG tablet prednisone 5 mg tablets in a dose pack  Take 1 dose pk by oral route as directed for 6 days.    [provider]  prochlorperazine (COMPAZINE) 10 MG tablet Take 1 tablet (10 mg total) by mouth every 6 (six) hours as  needed for nausea. 08/17/21   Ladell Pier, MD  Red Yeast Rice Extract 600 MG CAPS Take 1 capsule by mouth daily.    [provider]     Family History  Problem Relation Age of Onset   Heart disease Mother    Bladder Cancer Father 38   Lymphoma Paternal Aunt    Osteoporosis Sister    Colon cancer Neg Hx    Rectal cancer Neg Hx    Stomach cancer Neg Hx    Diabetes Neg Hx    Stroke Neg Hx     Social History   Socioeconomic History   Marital status: Married    Spouse name: Not on file   Number of children: 2   Years of education: Not on file   Highest education level: Not on file  Occupational History   Occupation: disability d/t RA  Tobacco Use   Smoking status: Never   Smokeless tobacco: Never  Vaping Use   Vaping Use: Never used  Substance and Sexual Activity   Alcohol use: No   Drug use: No   Sexual activity: Never    Birth control/protection: Post-menopausal, Surgical  Other Topics Concern   Not on file  Social History Narrative   [name] is her daughter    Household-- pr and husband   Social Determinants of Radio broadcast assistant Strain: Not on file  Food Insecurity: Not on file  Transportation Needs: Not on file  Physical Activity: Not on file  Stress: Not on file  Social Connections: Not on file    Review of Systems: A 12 point ROS discussed and pertinent positives are indicated in the HPI above.  All other systems are negative.  Review of Systems  Constitutional:  Negative for chills and fever.  HENT:  Negative for nosebleeds.   Eyes:  Negative for visual disturbance.  Respiratory:  Negative for cough and shortness of breath.   Cardiovascular:  Negative for chest pain and leg swelling.  Gastrointestinal:  Negative for abdominal pain, blood in stool, nausea and vomiting.  Genitourinary:  Negative for hematuria.  Neurological:  Positive for headaches.   Vital Signs: BP 138/84   Pulse 75   Temp 98.7 F (37.1 C) (Oral)    Resp 16   Ht 5\' 2"  (1.575 m)   Wt 145 lb (65.8 kg)   SpO2 99%   BMI 26.52 kg/m   Physical Exam Constitutional:      Appearance: Normal appearance. She is not ill-appearing.  HENT:     Head: Normocephalic and atraumatic.     Mouth/Throat:     Mouth: Mucous membranes are dry.     Pharynx: Oropharynx is clear.  Cardiovascular:     Rate and Rhythm: Normal rate and regular rhythm.     Pulses: Normal pulses.     Heart sounds: Normal heart sounds. No murmur heard.   No friction rub. No gallop.  Pulmonary:  Effort: Pulmonary effort is normal. No respiratory distress.     Breath sounds: Normal breath sounds. No stridor. No wheezing, rhonchi or rales.  Abdominal:     General: Bowel sounds are normal. There is no distension.     Tenderness: There is no abdominal tenderness. There is no guarding.  Musculoskeletal:     Right lower leg: No edema.     Left lower leg: No edema.  Skin:    General: Skin is warm and dry.  Neurological:     Mental Status: She is alert and oriented to person, place, and time.  Psychiatric:        Mood and Affect: Mood normal.        Behavior: Behavior normal.        Thought Content: Thought content normal.        Judgment: Judgment normal.    Imaging: CT ABDOMEN PELVIS W CONTRAST  Result Date: 08/14/2021 CLINICAL DATA:  Follow-up ovarian carcinoma. EXAM: CT ABDOMEN AND PELVIS WITH CONTRAST TECHNIQUE: Multidetector CT imaging of the abdomen and pelvis was performed using the standard protocol following bolus administration of intravenous contrast. CONTRAST:  139mL OMNIPAQUE IOHEXOL 300 MG/ML  SOLN COMPARISON:  05/13/2021 FINDINGS: Lower Chest: No acute findings. Hepatobiliary: No hepatic masses identified. Stable tiny sub-cm cyst in the inferior right hepatic lobe. Gallbladder is unremarkable. No evidence of biliary ductal dilatation. Pancreas:  No mass or inflammatory changes. Spleen: Within normal limits in size and appearance. Adrenals/Urinary Tract:  No masses identified. No evidence of ureteral calculi or hydronephrosis. Stomach/Bowel: No evidence of obstruction, inflammatory process or abnormal fluid collections. Vascular/Lymphatic: No pathologically enlarged lymph nodes. No acute vascular findings. Aortic atherosclerotic calcification noted. Reproductive: Prior hysterectomy. Soft tissue caking in the omentum shows mild increase since prior exam, with index nodule in the anterior left abdomen measuring 2.6 x 2.3 cm on image 38/2, compared to 1.8 x 1.5 cm previously. Another omental soft tissue nodule in the right upper quadrant measures 3.5 x 2.2 cm on image 30/2, compared to 2.0 x 1.2 cm previously. Diffuse peritoneal thickening in the mesentery shows no significant change. No evidence of ascites. Other:  None. Musculoskeletal:  No suspicious bone lesions identified. IMPRESSION: Mild increase in omental carcinomatosis since prior exam. No evidence of ascites. No new sites of metastatic disease identified. Aortic Atherosclerosis (ICD10-I70.0). Electronically Signed   By: Marlaine Hind M.D.   On: 08/14/2021 18:21    Labs:  CBC: Recent Labs    02/11/21 1045  WBC 6.1  HGB 13.9  HCT 41.4  PLT 254.0    COAGS: No results for input(s): INR, APTT in the last 8760 hours.  BMP: Recent Labs    01/13/21 1000 01/13/21 1057 02/11/21 1045 03/17/21 1449 05/11/21 0905 08/14/21 0945  NA 139  --  141 141 138 139  K 4.1  --  4.0 3.9 4.0 4.1  CL 104  --  104 106 106 101  CO2 28  --  29 25 24 28   GLUCOSE 93  --  94 152* 94 82  BUN 20  --  23 24* 28* 19  CALCIUM 9.1  --  9.2 9.5 9.0 9.3  CREATININE 0.65   < > 0.73 0.81 0.89 0.77  GFRNONAA >60  --   --  >60 >60 >60   < > = values in this interval not displayed.    LIVER FUNCTION TESTS: Recent Labs    02/11/21 1045 03/17/21 1449 05/11/21 0905  BILITOT 0.6 0.6 0.5  AST 19 21 18   ALT 21 21 20   ALKPHOS 58 60 51  PROT 6.5 7.1 6.3*  ALBUMIN 4.3 3.9 4.0    TUMOR MARKERS: No results for  input(s): AFPTM, CEA, CA199, CHROMGRNA in the last 8760 hours.  Assessment and Plan: History of anxiety, depression, hypercholesteremia, rheumatoid arthritis and ovarian cancer.  Patient has history of ovarian cancer from November 2013.  The past 2 years that has been a mild increase in peritoneal nodules.  Dr. Benay Spice has referred patient to IR for Port-A-Cath placement to begin treatment.  Pt sitting upright in bed watching TV. She is A&O, calm and pleasant. She is in no distress.  No labs needed today.  VSS  Risks and benefits of image guided port-a-catheter placement was discussed with the patient including, but not limited to bleeding, infection, pneumothorax, or fibrin sheath development and need for additional procedures.  All of the patient's questions were answered, patient is agreeable to proceed. Consent signed and in chart.   Thank you for this interesting consult.  I greatly enjoyed meeting [name] [name] and look forward to participating in their care.  A copy of this report was sent to the requesting provider on this date.  Electronically Signed: Tyson Alias, NP 09/01/2021, 10:36 AM   I spent a total of 30 minutes in face to face in clinical consultation, greater than 50% of which was counseling/coordinating care for image guided Port-A-Cath placement.

## 2021-09-01 ENCOUNTER — Encounter (HOSPITAL_COMMUNITY): Payer: Self-pay

## 2021-09-01 ENCOUNTER — Other Ambulatory Visit: Payer: Self-pay

## 2021-09-01 ENCOUNTER — Ambulatory Visit (HOSPITAL_COMMUNITY)
Admission: RE | Admit: 2021-09-01 | Discharge: 2021-09-01 | Disposition: A | Discharge status: Home / Self Care | Payer: Medicare Other | Source: Ambulatory Visit | Attending: Oncology | Admitting: Oncology

## 2021-09-01 ENCOUNTER — Other Ambulatory Visit: Payer: Self-pay | Admitting: Oncology

## 2021-09-01 DIAGNOSIS — F32A Depression, unspecified: Secondary | ICD-10-CM | POA: Insufficient documentation

## 2021-09-01 DIAGNOSIS — C569 Malignant neoplasm of unspecified ovary: Secondary | ICD-10-CM | POA: Diagnosis not present

## 2021-09-01 DIAGNOSIS — E7800 Pure hypercholesterolemia, unspecified: Secondary | ICD-10-CM | POA: Diagnosis not present

## 2021-09-01 DIAGNOSIS — M069 Rheumatoid arthritis, unspecified: Secondary | ICD-10-CM | POA: Insufficient documentation

## 2021-09-01 DIAGNOSIS — Z452 Encounter for adjustment and management of vascular access device: Secondary | ICD-10-CM | POA: Diagnosis not present

## 2021-09-01 DIAGNOSIS — F419 Anxiety disorder, unspecified: Secondary | ICD-10-CM | POA: Insufficient documentation

## 2021-09-01 HISTORY — PX: IR IMAGING GUIDED PORT INSERTION: IMG5740

## 2021-09-01 MED ORDER — FENTANYL CITRATE (PF) 100 MCG/2ML IJ SOLN
INTRAMUSCULAR | Status: AC | PRN
  Administered 2021-09-01: 50 ug via INTRAVENOUS

## 2021-09-01 MED ORDER — SODIUM CHLORIDE 0.9 % IV SOLN: INTRAVENOUS | Status: DC

## 2021-09-01 MED ORDER — MIDAZOLAM HCL 2 MG/2ML IJ SOLN
INTRAMUSCULAR | Status: AC | PRN
  Administered 2021-09-01: 1 mg via INTRAVENOUS

## 2021-09-01 MED ORDER — LIDOCAINE-EPINEPHRINE 1 %-1:100000 IJ SOLN
INTRAMUSCULAR | Status: AC | PRN
  Administered 2021-09-01: 10 mL via INTRADERMAL

## 2021-09-01 MED ORDER — HEPARIN SOD (PORK) LOCK FLUSH 100 UNIT/ML IV SOLN
INTRAVENOUS | Status: AC | PRN
  Administered 2021-09-01: 500 [IU] via INTRAVENOUS

## 2021-09-01 NOTE — Procedures (Signed)
  Procedure: R IJ port catheter placement   EBL:   minimal Complications:  none immediate  See full dictation in [facility].  Dillard Cannon MD Main # [phone] Pager  [phone] Mobile [phone]

## 2021-09-01 NOTE — Discharge Instructions (Signed)
For questions /concerns may call Interventional Radiology at [phone] ° °You may remove your dressing and shower tomorrow afternoon ° °DO NOT use EMLA cream for 2 weeks after port placement as the cream will remove surgical glue on your incision.    ° ° °Implanted Port Insertion, Care After °This sheet gives you information about how to care for yourself after your procedure. Your health care provider may also give you more specific instructions. If you have problems or questions, contact your health careprovider. °What can I expect after the procedure? °After the procedure, it is common to have: °Discomfort at the port insertion site. °Bruising on the skin over the port. This should improve over 3-4 days. °Follow these instructions at home: °Port care °After your port is placed, you will get a manufacturer's information card. The card has information about your port. Keep this card with you at all times. °Take care of the port as told by your health care provider. Ask your health care provider if you or a family member can get training for taking care of the port at home. A home health care nurse may also take care of the port. °Make sure to remember what type of port you have. °Incision care °Follow instructions from your health care provider about how to take care of your port insertion site. Make sure you: °Wash your hands with soap and water before and after you change your bandage (dressing). If soap and water are not available, use hand sanitizer. °Change your dressing as told by your health care provider. °Leave skin glue, or adhesive strips in place. These skin closures may need to stay in place for 2 weeks or longer.  °Check your port insertion site every day for signs of infection. Check for: °     - Redness, swelling, or pain. °                    - Fluid or blood. °     - Warmth. °     - Pus or a bad smell. °Activity °Return to your normal activities as told by your health care provider. Ask your  health care provider what activities are safe for you. °Do not lift anything that is heavier than 10 lb (4.5 kg), or the limit that you are told, until your health care provider says that it is safe. °General instructions °Take over-the-counter and prescription medicines only as told by your health care provider. °Do not take baths, swim, or use a hot tub until your health care provider approves. Ask your health care provider if you may take showers. You may only be allowed to take sponge baths. °Do not drive for 24 hours if you were given a sedative during your procedure. °Wear a medical alert bracelet in case of an emergency. This will tell any health care providers that you have a port. °Keep all follow-up visits as told by your health care provider. This is important. °Contact a health care provider if: °You cannot flush your port with saline as directed, or you cannot draw blood from the port. °You have a fever or chills. °You have redness, swelling, or pain around your port insertion site. °You have fluid or blood coming from your port insertion site. °Your port insertion site feels warm to the touch. °You have pus or a bad smell coming from the port insertion site. °Get help right away if: °You have chest pain or shortness of breath. °You have bleeding from   your port that you cannot control. °Summary °Take care of the port as told by your health care provider. Keep the manufacturer's information card with you at all times. °Change your dressing as told by your health care provider. °Contact a health care provider if you have a fever or chills or if you have redness, swelling, or pain around your port insertion site. °Keep all follow-up visits as told by your health care provider. °This information is not intended to replace advice given to you by your health care provider. Make sure you discuss any questions you have with your healthcare provider. ° °Moderate Conscious Sedation, Adult, Care After °This sheet  gives you information about how to care for yourself after your procedure. Your health care provider may also give you more specific instructions. If you have problems or questions, contact your health careprovider. °What can I expect after the procedure? °After the procedure, it is common to have: °Sleepiness for several hours. °Impaired judgment for several hours. °Difficulty with balance. °Vomiting if you eat too soon. °Follow these instructions at home: °For the time period you were told by your health care provider: °Rest. °Do not participate in activities where you could fall or become injured. °Do not drive or use machinery. °Do not drink alcohol. °Do not take sleeping pills or medicines that cause drowsiness. °Do not make important decisions or sign legal documents. °Do not take care of children on your own. °Eating and drinking ° °Follow the diet recommended by your health care provider. °Drink enough fluid to keep your urine pale yellow. °If you vomit: °Drink water, juice, or soup when you can drink without vomiting. °Make sure you have little or no nausea before eating solid foods. ° °General instructions °Take over-the-counter and prescription medicines only as told by your health care provider. °Have a responsible adult stay with you for the time you are told. It is important to have someone help care for you until you are awake and alert. °Do not smoke. °Keep all follow-up visits as told by your health care provider. This is important. °Contact a health care provider if: °You are still sleepy or having trouble with balance after 24 hours. °You feel light-headed. °You keep feeling nauseous or you keep vomiting. °You develop a rash. °You have a fever. °You have redness or swelling around the IV site. °Get help right away if: °You have trouble breathing. °You have new-onset confusion at home. °Summary °After the procedure, it is common to feel sleepy, have impaired judgment, or feel nauseous if you eat  too soon. °Rest after you get home. Know the things you should not do after the procedure. °Follow the diet recommended by your health care provider and drink enough fluid to keep your urine pale yellow. °Get help right away if you have trouble breathing or new-onset confusion at home. °This information is not intended to replace advice given to you by your health care provider. Make sure you discuss any questions you have with your healthcare provider. °Document Revised: 01/18/2020 Document Reviewed: 08/16/2019 °Elsevier Patient Education © 2022 [employer]  °

## 2021-09-05 ENCOUNTER — Other Ambulatory Visit: Payer: Self-pay | Admitting: Oncology

## 2021-09-08 ENCOUNTER — Other Ambulatory Visit: Payer: Self-pay | Admitting: *Deleted

## 2021-09-08 ENCOUNTER — Encounter: Payer: Self-pay | Admitting: Nurse Practitioner

## 2021-09-08 ENCOUNTER — Inpatient Hospital Stay: Payer: Medicare Other

## 2021-09-08 ENCOUNTER — Inpatient Hospital Stay: Payer: Medicare Other | Attending: Oncology | Admitting: Nurse Practitioner

## 2021-09-08 ENCOUNTER — Telehealth: Payer: Self-pay | Admitting: *Deleted

## 2021-09-08 ENCOUNTER — Other Ambulatory Visit: Payer: Self-pay

## 2021-09-08 ENCOUNTER — Other Ambulatory Visit: Payer: Self-pay | Admitting: Oncology

## 2021-09-08 VITALS — BP 128/75 | HR 83 | Temp 98.1°F | Resp 20 | Ht 62.0 in | Wt 148.6 lb

## 2021-09-08 DIAGNOSIS — C569 Malignant neoplasm of unspecified ovary: Secondary | ICD-10-CM | POA: Diagnosis not present

## 2021-09-08 DIAGNOSIS — Z5111 Encounter for antineoplastic chemotherapy: Secondary | ICD-10-CM | POA: Diagnosis not present

## 2021-09-08 DIAGNOSIS — T451X5A Adverse effect of antineoplastic and immunosuppressive drugs, initial encounter: Secondary | ICD-10-CM | POA: Insufficient documentation

## 2021-09-08 DIAGNOSIS — E7800 Pure hypercholesterolemia, unspecified: Secondary | ICD-10-CM | POA: Diagnosis not present

## 2021-09-08 DIAGNOSIS — R110 Nausea: Secondary | ICD-10-CM | POA: Insufficient documentation

## 2021-09-08 DIAGNOSIS — R590 Localized enlarged lymph nodes: Secondary | ICD-10-CM | POA: Insufficient documentation

## 2021-09-08 DIAGNOSIS — Z79899 Other long term (current) drug therapy: Secondary | ICD-10-CM | POA: Diagnosis not present

## 2021-09-08 DIAGNOSIS — Z8262 Family history of osteoporosis: Secondary | ICD-10-CM | POA: Diagnosis not present

## 2021-09-08 DIAGNOSIS — D701 Agranulocytosis secondary to cancer chemotherapy: Secondary | ICD-10-CM | POA: Diagnosis not present

## 2021-09-08 DIAGNOSIS — Z8269 Family history of other diseases of the musculoskeletal system and connective tissue: Secondary | ICD-10-CM | POA: Diagnosis not present

## 2021-09-08 DIAGNOSIS — Z90710 Acquired absence of both cervix and uterus: Secondary | ICD-10-CM | POA: Insufficient documentation

## 2021-09-08 DIAGNOSIS — Z8249 Family history of ischemic heart disease and other diseases of the circulatory system: Secondary | ICD-10-CM | POA: Diagnosis not present

## 2021-09-08 DIAGNOSIS — Z95828 Presence of other vascular implants and grafts: Secondary | ICD-10-CM | POA: Diagnosis not present

## 2021-09-08 DIAGNOSIS — Z90721 Acquired absence of ovaries, unilateral: Secondary | ICD-10-CM | POA: Insufficient documentation

## 2021-09-08 DIAGNOSIS — I700 Atherosclerosis of aorta: Secondary | ICD-10-CM | POA: Diagnosis not present

## 2021-09-08 DIAGNOSIS — M069 Rheumatoid arthritis, unspecified: Secondary | ICD-10-CM | POA: Diagnosis not present

## 2021-09-08 DIAGNOSIS — F419 Anxiety disorder, unspecified: Secondary | ICD-10-CM | POA: Insufficient documentation

## 2021-09-08 DIAGNOSIS — N816 Rectocele: Secondary | ICD-10-CM | POA: Insufficient documentation

## 2021-09-08 DIAGNOSIS — Z8052 Family history of malignant neoplasm of bladder: Secondary | ICD-10-CM | POA: Diagnosis not present

## 2021-09-08 LAB — CMP (CANCER CENTER ONLY)
ALT: 16 U/L (ref 0–44)
AST: 17 U/L (ref 15–41)
Albumin: 4.2 g/dL (ref 3.5–5.0)
Alkaline Phosphatase: 55 U/L (ref 38–126)
Anion gap: 7 (ref 5–15)
BUN: 24 mg/dL — ABNORMAL HIGH (ref 8–23)
CO2: 28 mmol/L (ref 22–32)
Calcium: 9.6 mg/dL (ref 8.9–10.3)
Chloride: 104 mmol/L (ref 98–111)
Creatinine: 0.57 mg/dL (ref 0.44–1.00)
GFR, Estimated: >60 mL/min (ref >60)
Glucose, Bld: 94 mg/dL (ref 70–99)
Potassium: 3.9 mmol/L (ref 3.5–5.1)
Sodium: 139 mmol/L (ref 135–145)
Total Bilirubin: 0.5 mg/dL (ref 0.3–1.2)
Total Protein: 6.3 g/dL — ABNORMAL LOW (ref 6.5–8.1)

## 2021-09-08 LAB — CBC WITH DIFFERENTIAL (CANCER CENTER ONLY)
Abs Immature Granulocytes: 0.03 10*3/uL (ref 0.00–0.07)
Basophils Absolute: 0 10*3/uL (ref 0.0–0.1)
Basophils Relative: 1 %
Eosinophils Absolute: 0.1 10*3/uL (ref 0.0–0.5)
Eosinophils Relative: 2 %
HCT: 42.3 % (ref 36.0–46.0)
Hemoglobin: 13.6 g/dL (ref 12.0–15.0)
Immature Granulocytes: 0 %
Lymphocytes Relative: 12 %
Lymphs Abs: 0.9 10*3/uL (ref 0.7–4.0)
MCH: 31.9 pg (ref 26.0–34.0)
MCHC: 32.2 g/dL (ref 30.0–36.0)
MCV: 99.3 fL (ref 80.0–100.0)
Monocytes Absolute: 0.8 10*3/uL (ref 0.1–1.0)
Monocytes Relative: 11 %
Neutro Abs: 5.6 10*3/uL (ref 1.7–7.7)
Neutrophils Relative %: 74 %
Platelet Count: 261 10*3/uL (ref 150–400)
RBC: 4.26 MIL/uL (ref 3.87–5.11)
RDW: 13.4 % (ref 11.5–15.5)
WBC Count: 7.6 10*3/uL (ref 4.0–10.5)
nRBC: 0 % (ref 0.0–0.2)

## 2021-09-08 MED ORDER — HEPARIN SOD (PORK) LOCK FLUSH 100 UNIT/ML IV SOLN
500.0000 [IU] | Freq: Once | INTRAVENOUS | Status: AC
  Administered 2021-09-08: 500 [IU] via INTRAVENOUS

## 2021-09-08 MED ORDER — DEXAMETHASONE 2 MG PO TABS: ORAL_TABLET | ORAL | 0 refills | Status: DC

## 2021-09-08 NOTE — Telephone Encounter (Signed)
Spoke with the patient and rescheduled the appt from 12/16 to 12/23

## 2021-09-08 NOTE — Progress Notes (Signed)
[facility] PROGRESS NOTE   Diagnosis: Ovarian cancer  INTERVAL HISTORY:   [name] [name] returns as scheduled.  She feels well.  No nausea or vomiting.  No mouth sores.  No constipation or diarrhea.  No abdominal pain.  She misunderstood the dexamethasone premedication instructions and took it prior to the port placement rather than chemotherapy.  Objective:  Vital signs in last 24 hours:  Blood pressure 128/75, pulse 83, temperature 98.1 F (36.7 C), temperature source Oral, resp. rate 20, height '5\' 2"'  (1.575 m), weight 148 lb 9.6 oz (67.4 kg), SpO2 100 %.    HEENT: No thrush or ulcers. Resp: Lungs clear bilaterally. Cardio: Regular rate and rhythm. GI: Abdomen soft and nontender.  No hepatosplenomegaly. Vascular: No leg edema. Skin: Resolving ecchymosis right chest wall extending to the breast. Port-A-Cath with mild erythema in a tape distribution.   Lab Results:  Lab Results  Component Value Date   WBC 7.6 09/08/2021   HGB 13.6 09/08/2021   HCT 42.3 09/08/2021   MCV 99.3 09/08/2021   PLT 261 09/08/2021   NEUTROABS 5.6 09/08/2021    Imaging:  No results found.  Medications: I have reviewed the patient's current medications.  Assessment/Plan:  1. 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 A 5 mm nodules remain on the right diaphragm. - TumorNext paired germline/tumor analyses: No somatic variants detected, germline CHEK2      VUS      Cycle 1 of adjuvant Taxol/carboplatin chemotherapy initiated on 09/19/2012.   The CA 125 normalized.   She completed day 15 of cycle 6 on 02/06/2013.   Restaging CT evaluation 03/29/2013 showed no evidence of metastatic disease in the chest. There was marked improvement in appearance/resolution of previous described peritoneal/omental disease. There was no  convincing evidence of residual disease. There was minimal increased density in the region of the omentum favored to be treatment-related. There was no pelvic adenopathy. CA125 3.2 on 02/19/2014. 08/18/2016 CA-125 8 CT abdomen/pelvis 08/25/2016-solitary new enlarged right external iliac lymph node measuring 2.2 cm. Biopsy 09/07/2016-adenocarcinoma consistent with high-grade serous carcinoma. PET scan 09/22/2016-malignant range FDG uptake associated with the enlarged right external iliac lymph node compatible with metastatic adenopathy.  No additional sites of metastatic disease identified. Radiation 10/25/2016-12/01/2016 PET scan 03/28/2017-previously enlarged hypermetabolic right external iliac node-normal in size with resolution of hypermetabolic activity, no active malignancy identified PET scan 05/15/2018-no evidence of recurrent or metastatic disease, no hypermetabolic lymph nodes CT abdomen/pelvis 09/19/2019-mild increase in the size of several left upper quadrant peritoneal nodules measuring up to 11 mm.  No other sites of metastatic disease identified.  No ascites. CT abdomen/pelvis 03/14/2020-slight enlargement of several left upper quadrant peritoneal nodules, no ascites, no other evidence of disease progression CT abdomen/pelvis 09/12/2020 peritoneal nodularity/omental caking predominantly in the left upper/mid abdomen, mildly progressive.  Largest implant in the left upper abdomen adjacent to the stomach now measures 17 mm, previously 13 mm.  Overall volume of peritoneal disease has mildly progressed. CT abdomen/pelvis 01/13/2021- 4 mm right ureteral calculus with moderate right hydronephrosis and upper right hydroureter, progressive omental nodularity, stable calcified and partially calcified right pelvic nodules CT abdomen/pelvis 05/13/2021-enlargement of left upper quadrant omental nodules and a peritoneal nodule at the right iliac fossa, no ascites CT abdomen/pelvis 08/14/2021-mild increase in  size of omental nodules, no ascites, no new site of metastatic disease Cycle 1 week 1 Taxol/carboplatin 09/09/2021 2. Low  abdomen/suprapubic pain prior to the exploratory laparotomy-likely secondary to omental/pelvic tumor; persistent mild pain in the lower abdomen   3. Chronic neck and back pain.   4. Anxiety -persistent despite Lexapro and Xanax. She has been evaluated by psychiatry. Currently on Lexapro. 5. Status post Port-A-Cath placement 09/22/2012. The Port-A-Cath was removed on 04/03/2013.   6. Neutropenia secondary to chemotherapy- day 15 cycle 1 and cycle 3. Taxol/carboplatin not given secondary to neutropenia.    7. Herpes zoster involving a right thoracic dermatome July 2015. She completed a course of Valtrex. 8. Nodular bony prominence at the left pelvis on rectal exam 06/05/2015-likely a benign finding 9.  Right ureter stone/hydroureteronephrosis on CT 01/13/2021-referred to urology; status post right ureteroscopy with stone extraction and ureteral stent placement.  Stent removal 03/11/2021      Disposition: Ms. Veloso appears stable.  She is scheduled to begin treatment today with weekly Taxol/carboplatin.  She misunderstood the dexamethasone premedication instructions.  We reviewed the rationale for the steroid premedication.  She would like to reschedule treatment to tomorrow.  We are sending a new dexamethasone prescription to her pharmacy.  CBC and chemistry panel reviewed.  Labs adequate to proceed as above.  She will return for lab, follow-up, week 2 Taxol/carboplatin 09/15/2021.  She will contact the office in the interim with any problems.    Ned Card ANP/GNP-BC   09/08/2021  9:45 AM

## 2021-09-09 ENCOUNTER — Inpatient Hospital Stay: Payer: Medicare Other

## 2021-09-09 VITALS — BP 134/87 | HR 75 | Temp 97.9°F | Resp 18

## 2021-09-09 DIAGNOSIS — Z8262 Family history of osteoporosis: Secondary | ICD-10-CM | POA: Diagnosis not present

## 2021-09-09 DIAGNOSIS — R110 Nausea: Secondary | ICD-10-CM | POA: Diagnosis not present

## 2021-09-09 DIAGNOSIS — R590 Localized enlarged lymph nodes: Secondary | ICD-10-CM | POA: Diagnosis not present

## 2021-09-09 DIAGNOSIS — Z90710 Acquired absence of both cervix and uterus: Secondary | ICD-10-CM | POA: Diagnosis not present

## 2021-09-09 DIAGNOSIS — M069 Rheumatoid arthritis, unspecified: Secondary | ICD-10-CM | POA: Diagnosis not present

## 2021-09-09 DIAGNOSIS — N816 Rectocele: Secondary | ICD-10-CM | POA: Diagnosis not present

## 2021-09-09 DIAGNOSIS — Z5111 Encounter for antineoplastic chemotherapy: Secondary | ICD-10-CM | POA: Diagnosis not present

## 2021-09-09 DIAGNOSIS — Z79899 Other long term (current) drug therapy: Secondary | ICD-10-CM | POA: Diagnosis not present

## 2021-09-09 DIAGNOSIS — I700 Atherosclerosis of aorta: Secondary | ICD-10-CM | POA: Diagnosis not present

## 2021-09-09 DIAGNOSIS — F419 Anxiety disorder, unspecified: Secondary | ICD-10-CM | POA: Diagnosis not present

## 2021-09-09 DIAGNOSIS — Z90721 Acquired absence of ovaries, unilateral: Secondary | ICD-10-CM | POA: Diagnosis not present

## 2021-09-09 DIAGNOSIS — D701 Agranulocytosis secondary to cancer chemotherapy: Secondary | ICD-10-CM | POA: Diagnosis not present

## 2021-09-09 DIAGNOSIS — E7800 Pure hypercholesterolemia, unspecified: Secondary | ICD-10-CM | POA: Diagnosis not present

## 2021-09-09 DIAGNOSIS — Z8249 Family history of ischemic heart disease and other diseases of the circulatory system: Secondary | ICD-10-CM | POA: Diagnosis not present

## 2021-09-09 DIAGNOSIS — T451X5A Adverse effect of antineoplastic and immunosuppressive drugs, initial encounter: Secondary | ICD-10-CM | POA: Diagnosis not present

## 2021-09-09 DIAGNOSIS — C569 Malignant neoplasm of unspecified ovary: Secondary | ICD-10-CM

## 2021-09-09 MED ORDER — FAMOTIDINE 20 MG IN NS 100 ML IVPB
20.0000 mg | Freq: Once | INTRAVENOUS | Status: AC
  Administered 2021-09-09: 20 mg via INTRAVENOUS
  Filled 2021-09-09: qty 100

## 2021-09-09 MED ORDER — DIPHENHYDRAMINE HCL 50 MG/ML IJ SOLN
50.0000 mg | Freq: Once | INTRAMUSCULAR | Status: AC
  Administered 2021-09-09: 50 mg via INTRAVENOUS
  Filled 2021-09-09: qty 1

## 2021-09-09 MED ORDER — SODIUM CHLORIDE 0.9% FLUSH
10.0000 mL | INTRAVENOUS | Status: DC | PRN
  Administered 2021-09-09: 10 mL

## 2021-09-09 MED ORDER — SODIUM CHLORIDE 0.9 % IV SOLN
159.4000 mg | Freq: Once | INTRAVENOUS | Status: AC
  Administered 2021-09-09: 160 mg via INTRAVENOUS
  Filled 2021-09-09: qty 16

## 2021-09-09 MED ORDER — SODIUM CHLORIDE 0.9 % IV SOLN
80.0000 mg/m2 | Freq: Once | INTRAVENOUS | Status: AC
  Administered 2021-09-09: 138 mg via INTRAVENOUS
  Filled 2021-09-09: qty 23

## 2021-09-09 MED ORDER — SODIUM CHLORIDE 0.9 % IV SOLN
10.0000 mg | Freq: Once | INTRAVENOUS | Status: AC
  Administered 2021-09-09: 10 mg via INTRAVENOUS
  Filled 2021-09-09: qty 1

## 2021-09-09 MED ORDER — SODIUM CHLORIDE 0.9 % IV SOLN: Freq: Once | INTRAVENOUS | Status: AC

## 2021-09-09 MED ORDER — HEPARIN SOD (PORK) LOCK FLUSH 100 UNIT/ML IV SOLN
500.0000 [IU] | Freq: Once | INTRAVENOUS | Status: AC | PRN
  Administered 2021-09-09: 500 [IU]

## 2021-09-09 MED ORDER — PALONOSETRON HCL INJECTION 0.25 MG/5ML
0.2500 mg | Freq: Once | INTRAVENOUS | Status: AC
  Administered 2021-09-09: 0.25 mg via INTRAVENOUS
  Filled 2021-09-09: qty 5

## 2021-09-09 NOTE — Progress Notes (Signed)
Patient presents for treatment. RN assessment completed along with the following:  Labs/vitals reviewed - Yes, and within treatment parameters.   Weight within 10% of previous measurement - Yes Oncology Treatment Attestation completed for current therapy- Yes, on date 08/17/21 Informed consent completed and reflects current therapy/intent - Yes, on date 09/09/21             Provider progress note reviewed - Patient not seen by provider today. Most recent note dated 09/08/21 reviewed. Treatment/Antibody/Supportive plan reviewed - Yes, and there are no adjustments needed for today's treatment. S&H and other orders reviewed - Yes, and there are no additional orders identified. Previous treatment date reviewed - Yes, and the appropriate amount of time has elapsed between treatments.   Patient to proceed with treatment.

## 2021-09-09 NOTE — Progress Notes (Signed)
Pharmacist Chemotherapy Monitoring - Initial Assessment    Anticipated start date: 09/09/21   The following has been reviewed per standard work regarding the patient's treatment regimen: The patient's diagnosis, treatment plan and drug doses, and organ/hematologic function Lab orders and baseline tests specific to treatment regimen  The treatment plan start date, drug sequencing, and pre-medications Prior authorization status  Patient's documented medication list, including drug-drug interaction screen and prescriptions for anti-emetics and supportive care specific to the treatment regimen The drug concentrations, fluid compatibility, administration routes, and timing of the medications to be used The patient's access for treatment and lifetime cumulative dose history, if applicable  The patient's medication allergies and previous infusion related reactions, if applicable   Changes made to treatment plan:  N/A  Follow up needed:  N/A   [name] [name], [facility], 09/09/2021  7:47 AM

## 2021-09-09 NOTE — Patient Instructions (Signed)
[facility]  Discharge Instructions: Thank you for choosing [facility] to provide your oncology and hematology care.   If you have a lab appointment with the [facility], please go directly to the [facility] and check in at the registration area.   Wear comfortable clothing and clothing appropriate for easy access to any Portacath or PICC line.   We strive to give you quality time with your provider. You may need to reschedule your appointment if you arrive late (15 or more minutes).  Arriving late affects you and other patients whose appointments are after yours.  Also, if you miss three or more appointments without notifying the office, you may be dismissed from the clinic at the provider's discretion.      For prescription refill requests, have your pharmacy contact our office and allow 72 hours for refills to be completed.    Today you received the following chemotherapy and/or immunotherapy agents: Paclitaxel, Carboplatin      To help prevent nausea and vomiting after your treatment, we encourage you to take your nausea medication as directed.  BELOW ARE SYMPTOMS THAT SHOULD BE REPORTED IMMEDIATELY: *FEVER GREATER THAN 100.4 F (38 C) OR HIGHER *CHILLS OR SWEATING *NAUSEA AND VOMITING THAT IS NOT CONTROLLED WITH YOUR NAUSEA MEDICATION *UNUSUAL SHORTNESS OF BREATH *UNUSUAL BRUISING OR BLEEDING *URINARY PROBLEMS (pain or burning when urinating, or frequent urination) *BOWEL PROBLEMS (unusual diarrhea, constipation, pain near the anus) TENDERNESS IN MOUTH AND THROAT WITH OR WITHOUT PRESENCE OF ULCERS (sore throat, sores in mouth, or a toothache) UNUSUAL RASH, SWELLING OR PAIN  UNUSUAL VAGINAL DISCHARGE OR ITCHING   Items with * indicate a potential emergency and should be followed up as soon as possible or go to the Emergency Department if any problems should occur.  Please show the CHEMOTHERAPY ALERT CARD or IMMUNOTHERAPY ALERT CARD at  check-in to the Emergency Department and triage nurse.  Should you have questions after your visit or need to cancel or reschedule your appointment, please contact [facility]  Dept: [phone]  and follow the prompts.  Office hours are 8:00 a.m. to 4:30 p.m. Monday - Friday. Please note that voicemails left after 4:00 p.m. may not be returned until the following business day.  We are closed weekends and major holidays. You have access to a nurse at all times for urgent questions. Please call the main number to the clinic Dept: [phone] and follow the prompts.   For any non-urgent questions, you may also contact your provider using MyChart. We now offer e-Visits for anyone 20 and older to request care online for non-urgent symptoms. For details visit [email or web address].   Also download the MyChart app! Go to the app store, search "MyChart", open the app, select [facility], and log in with your MyChart username and password.  Due to Covid, a mask is required upon entering the hospital/clinic. If you do not have a mask, one will be given to you upon arrival. For doctor visits, patients may have 1 support person aged 35 or older with them. For treatment visits, patients cannot have anyone with them due to current Covid guidelines and our immunocompromised population.   Paclitaxel injection What is this medication? PACLITAXEL (PAK li TAX el) is a chemotherapy drug. It targets fast dividing cells, like cancer cells, and causes these cells to die. This medicine is used to treat ovarian cancer, breast cancer, lung cancer, Kaposi's sarcoma, and other cancers. This medicine  may be used for other purposes; ask your health care provider or pharmacist if you have questions. COMMON BRAND NAME(S): Onxol, Taxol What should I tell my care team before I take this medication? They need to know if you have any of these conditions: history of irregular heartbeat liver  disease low blood counts, like low white cell, platelet, or red cell counts lung or breathing disease, like asthma tingling of the fingers or toes, or other nerve disorder an unusual or allergic reaction to paclitaxel, alcohol, polyoxyethylated castor oil, other chemotherapy, other medicines, foods, dyes, or preservatives pregnant or trying to get pregnant breast-feeding How should I use this medication? This drug is given as an infusion into a vein. It is administered in a hospital or clinic by a specially trained health care professional. Talk to your pediatrician regarding the use of this medicine in children. Special care may be needed. Overdosage: If you think you have taken too much of this medicine contact a poison control center or emergency room at once. NOTE: This medicine is only for you. Do not share this medicine with others. What if I miss a dose? It is important not to miss your dose. Call your doctor or health care professional if you are unable to keep an appointment. What may interact with this medication? Do not take this medicine with any of the following medications: live virus vaccines This medicine may also interact with the following medications: antiviral medicines for hepatitis, HIV or AIDS certain antibiotics like erythromycin and clarithromycin certain medicines for fungal infections like ketoconazole and itraconazole certain medicines for seizures like carbamazepine, phenobarbital, phenytoin gemfibrozil nefazodone rifampin St. John's wort This list may not describe all possible interactions. Give your health care provider a list of all the medicines, herbs, non-prescription drugs, or dietary supplements you use. Also tell them if you smoke, drink alcohol, or use illegal drugs. Some items may interact with your medicine. What should I watch for while using this medication? Your condition will be monitored carefully while you are receiving this medicine. You  will need important blood work done while you are taking this medicine. This medicine can cause serious allergic reactions. To reduce your risk you will need to take other medicine(s) before treatment with this medicine. If you experience allergic reactions like skin rash, itching or hives, swelling of the face, lips, or tongue, tell your doctor or health care professional right away. In some cases, you may be given additional medicines to help with side effects. Follow all directions for their use. This drug may make you feel generally unwell. This is not uncommon, as chemotherapy can affect healthy cells as well as cancer cells. Report any side effects. Continue your course of treatment even though you feel ill unless your doctor tells you to stop. Call your doctor or health care professional for advice if you get a fever, chills or sore throat, or other symptoms of a cold or flu. Do not treat yourself. This drug decreases your body's ability to fight infections. Try to avoid being around people who are sick. This medicine may increase your risk to bruise or bleed. Call your doctor or health care professional if you notice any unusual bleeding. Be careful brushing and flossing your teeth or using a toothpick because you may get an infection or bleed more easily. If you have any dental work done, tell your dentist you are receiving this medicine. Avoid taking products that contain aspirin, acetaminophen, ibuprofen, naproxen, or ketoprofen unless instructed by  your doctor. These medicines may hide a fever. Do not become pregnant while taking this medicine. Women should inform their doctor if they wish to become pregnant or think they might be pregnant. There is a potential for serious side effects to an unborn child. Talk to your health care professional or pharmacist for more information. Do not breast-feed an infant while taking this medicine. Men are advised not to father a child while receiving this  medicine. This product may contain alcohol. Ask your pharmacist or healthcare provider if this medicine contains alcohol. Be sure to tell all healthcare providers you are taking this medicine. Certain medicines, like metronidazole and disulfiram, can cause an unpleasant reaction when taken with alcohol. The reaction includes flushing, headache, nausea, vomiting, sweating, and increased thirst. The reaction can last from 30 minutes to several hours. What side effects may I notice from receiving this medication? Side effects that you should report to your doctor or health care professional as soon as possible: allergic reactions like skin rash, itching or hives, swelling of the face, lips, or tongue breathing problems changes in vision fast, irregular heartbeat high or low blood pressure mouth sores pain, tingling, numbness in the hands or feet signs of decreased platelets or bleeding - bruising, pinpoint red spots on the skin, black, tarry stools, blood in the urine signs of decreased red blood cells - unusually weak or tired, feeling faint or lightheaded, falls signs of infection - fever or chills, cough, sore throat, pain or difficulty passing urine signs and symptoms of liver injury like dark yellow or brown urine; general ill feeling or flu-like symptoms; light-colored stools; loss of appetite; nausea; right upper belly pain; unusually weak or tired; yellowing of the eyes or skin swelling of the ankles, feet, hands unusually slow heartbeat Side effects that usually do not require medical attention (report to your doctor or health care professional if they continue or are bothersome): diarrhea hair loss loss of appetite muscle or joint pain nausea, vomiting pain, redness, or irritation at site where injected tiredness This list may not describe all possible side effects. Call your doctor for medical advice about side effects. You may report side effects to FDA at 1-800-FDA-1088. Where  should I keep my medication? This drug is given in a hospital or clinic and will not be stored at home. NOTE: This sheet is a summary. It may not cover all possible information. If you have questions about this medicine, talk to your doctor, pharmacist, or health care provider.  2022 Elsevier/Gold Standard (2021-06-09 00:00:00)  Carboplatin injection What is this medication? CARBOPLATIN (KAR boe pla tin) is a chemotherapy drug. It targets fast dividing cells, like cancer cells, and causes these cells to die. This medicine is used to treat ovarian cancer and many other cancers. This medicine may be used for other purposes; ask your health care provider or pharmacist if you have questions. COMMON BRAND NAME(S): Paraplatin What should I tell my care team before I take this medication? They need to know if you have any of these conditions: blood disorders hearing problems kidney disease recent or ongoing radiation therapy an unusual or allergic reaction to carboplatin, cisplatin, other chemotherapy, other medicines, foods, dyes, or preservatives pregnant or trying to get pregnant breast-feeding How should I use this medication? This drug is usually given as an infusion into a vein. It is administered in a hospital or clinic by a specially trained health care professional. Talk to your pediatrician regarding the use of this medicine in   children. Special care may be needed. Overdosage: If you think you have taken too much of this medicine contact a poison control center or emergency room at once. NOTE: This medicine is only for you. Do not share this medicine with others. What if I miss a dose? It is important not to miss a dose. Call your doctor or health care professional if you are unable to keep an appointment. What may interact with this medication? medicines for seizures medicines to increase blood counts like filgrastim, pegfilgrastim, sargramostim some antibiotics like amikacin,  gentamicin, neomycin, streptomycin, tobramycin vaccines Talk to your doctor or health care professional before taking any of these medicines: acetaminophen aspirin ibuprofen ketoprofen naproxen This list may not describe all possible interactions. Give your health care provider a list of all the medicines, herbs, non-prescription drugs, or dietary supplements you use. Also tell them if you smoke, drink alcohol, or use illegal drugs. Some items may interact with your medicine. What should I watch for while using this medication? Your condition will be monitored carefully while you are receiving this medicine. You will need important blood work done while you are taking this medicine. This drug may make you feel generally unwell. This is not uncommon, as chemotherapy can affect healthy cells as well as cancer cells. Report any side effects. Continue your course of treatment even though you feel ill unless your doctor tells you to stop. In some cases, you may be given additional medicines to help with side effects. Follow all directions for their use. Call your doctor or health care professional for advice if you get a fever, chills or sore throat, or other symptoms of a cold or flu. Do not treat yourself. This drug decreases your body's ability to fight infections. Try to avoid being around people who are sick. This medicine may increase your risk to bruise or bleed. Call your doctor or health care professional if you notice any unusual bleeding. Be careful brushing and flossing your teeth or using a toothpick because you may get an infection or bleed more easily. If you have any dental work done, tell your dentist you are receiving this medicine. Avoid taking products that contain aspirin, acetaminophen, ibuprofen, naproxen, or ketoprofen unless instructed by your doctor. These medicines may hide a fever. Do not become pregnant while taking this medicine. Women should inform their doctor if they wish  to become pregnant or think they might be pregnant. There is a potential for serious side effects to an unborn child. Talk to your health care professional or pharmacist for more information. Do not breast-feed an infant while taking this medicine. What side effects may I notice from receiving this medication? Side effects that you should report to your doctor or health care professional as soon as possible: allergic reactions like skin rash, itching or hives, swelling of the face, lips, or tongue signs of infection - fever or chills, cough, sore throat, pain or difficulty passing urine signs of decreased platelets or bleeding - bruising, pinpoint red spots on the skin, black, tarry stools, nosebleeds signs of decreased red blood cells - unusually weak or tired, fainting spells, lightheadedness breathing problems changes in hearing changes in vision chest pain high blood pressure low blood counts - This drug may decrease the number of white blood cells, red blood cells and platelets. You may be at increased risk for infections and bleeding. nausea and vomiting pain, swelling, redness or irritation at the injection site pain, tingling, numbness in the hands or feet   problems with balance, talking, walking trouble passing urine or change in the amount of urine Side effects that usually do not require medical attention (report to your doctor or health care professional if they continue or are bothersome): hair loss loss of appetite metallic taste in the mouth or changes in taste This list may not describe all possible side effects. Call your doctor for medical advice about side effects. You may report side effects to FDA at 1-800-FDA-1088. Where should I keep my medication? This drug is given in a hospital or clinic and will not be stored at home. NOTE: This sheet is a summary. It may not cover all possible information. If you have questions about this medicine, talk to your doctor, pharmacist,  or health care provider.  2022 Elsevier/Gold Standard (2008-02-28 00:00:00)

## 2021-09-10 ENCOUNTER — Telehealth: Payer: Self-pay

## 2021-09-10 NOTE — Telephone Encounter (Signed)
Called patient for first time chemo followup.  Patient states she has felt fine with not reports of nausea. Encouraged patient to continue to stay hydrated and to take nausea meds as needed.  Patient instructed to call office with any additional questions or concerns. Patient verbalized understanding.

## 2021-09-12 ENCOUNTER — Other Ambulatory Visit: Payer: Self-pay | Admitting: Oncology

## 2021-09-15 ENCOUNTER — Inpatient Hospital Stay: Payer: Medicare Other | Admitting: Nurse Practitioner

## 2021-09-15 ENCOUNTER — Other Ambulatory Visit: Payer: Self-pay

## 2021-09-15 ENCOUNTER — Inpatient Hospital Stay: Payer: Medicare Other

## 2021-09-15 ENCOUNTER — Encounter: Payer: Self-pay | Admitting: Nurse Practitioner

## 2021-09-15 VITALS — BP 115/70 | HR 83 | Temp 98.7°F | Resp 18 | Ht 62.0 in | Wt 151.8 lb

## 2021-09-15 DIAGNOSIS — R110 Nausea: Secondary | ICD-10-CM | POA: Diagnosis not present

## 2021-09-15 DIAGNOSIS — Z8249 Family history of ischemic heart disease and other diseases of the circulatory system: Secondary | ICD-10-CM | POA: Diagnosis not present

## 2021-09-15 DIAGNOSIS — R590 Localized enlarged lymph nodes: Secondary | ICD-10-CM | POA: Diagnosis not present

## 2021-09-15 DIAGNOSIS — Z5111 Encounter for antineoplastic chemotherapy: Secondary | ICD-10-CM | POA: Diagnosis not present

## 2021-09-15 DIAGNOSIS — C569 Malignant neoplasm of unspecified ovary: Secondary | ICD-10-CM

## 2021-09-15 DIAGNOSIS — E7800 Pure hypercholesterolemia, unspecified: Secondary | ICD-10-CM | POA: Diagnosis not present

## 2021-09-15 DIAGNOSIS — Z79899 Other long term (current) drug therapy: Secondary | ICD-10-CM | POA: Diagnosis not present

## 2021-09-15 DIAGNOSIS — N816 Rectocele: Secondary | ICD-10-CM | POA: Diagnosis not present

## 2021-09-15 DIAGNOSIS — Z90710 Acquired absence of both cervix and uterus: Secondary | ICD-10-CM | POA: Diagnosis not present

## 2021-09-15 DIAGNOSIS — Z90721 Acquired absence of ovaries, unilateral: Secondary | ICD-10-CM | POA: Diagnosis not present

## 2021-09-15 DIAGNOSIS — D701 Agranulocytosis secondary to cancer chemotherapy: Secondary | ICD-10-CM | POA: Diagnosis not present

## 2021-09-15 DIAGNOSIS — M069 Rheumatoid arthritis, unspecified: Secondary | ICD-10-CM | POA: Diagnosis not present

## 2021-09-15 DIAGNOSIS — F419 Anxiety disorder, unspecified: Secondary | ICD-10-CM | POA: Diagnosis not present

## 2021-09-15 DIAGNOSIS — I700 Atherosclerosis of aorta: Secondary | ICD-10-CM | POA: Diagnosis not present

## 2021-09-15 DIAGNOSIS — Z8262 Family history of osteoporosis: Secondary | ICD-10-CM | POA: Diagnosis not present

## 2021-09-15 DIAGNOSIS — T451X5A Adverse effect of antineoplastic and immunosuppressive drugs, initial encounter: Secondary | ICD-10-CM | POA: Diagnosis not present

## 2021-09-15 LAB — CBC WITH DIFFERENTIAL (CANCER CENTER ONLY)
Abs Immature Granulocytes: 0.04 10*3/uL (ref 0.00–0.07)
Basophils Absolute: 0 10*3/uL (ref 0.0–0.1)
Basophils Relative: 1 %
Eosinophils Absolute: 0.1 10*3/uL (ref 0.0–0.5)
Eosinophils Relative: 2 %
HCT: 40.1 % (ref 36.0–46.0)
Hemoglobin: 12.8 g/dL (ref 12.0–15.0)
Immature Granulocytes: 1 %
Lymphocytes Relative: 11 %
Lymphs Abs: 0.6 10*3/uL — ABNORMAL LOW (ref 0.7–4.0)
MCH: 31.8 pg (ref 26.0–34.0)
MCHC: 31.9 g/dL (ref 30.0–36.0)
MCV: 99.8 fL (ref 80.0–100.0)
Monocytes Absolute: 0.3 10*3/uL (ref 0.1–1.0)
Monocytes Relative: 5 %
Neutro Abs: 4.4 10*3/uL (ref 1.7–7.7)
Neutrophils Relative %: 80 %
Platelet Count: 246 10*3/uL (ref 150–400)
RBC: 4.02 MIL/uL (ref 3.87–5.11)
RDW: 13.2 % (ref 11.5–15.5)
WBC Count: 5.5 10*3/uL (ref 4.0–10.5)
nRBC: 0 % (ref 0.0–0.2)

## 2021-09-15 LAB — CMP (CANCER CENTER ONLY)
ALT: 34 U/L (ref 0–44)
AST: 26 U/L (ref 15–41)
Albumin: 3.8 g/dL (ref 3.5–5.0)
Alkaline Phosphatase: 48 U/L (ref 38–126)
Anion gap: 8 (ref 5–15)
BUN: 26 mg/dL — ABNORMAL HIGH (ref 8–23)
CO2: 28 mmol/L (ref 22–32)
Calcium: 8.8 mg/dL — ABNORMAL LOW (ref 8.9–10.3)
Chloride: 104 mmol/L (ref 98–111)
Creatinine: 1 mg/dL (ref 0.44–1.00)
GFR, Estimated: >60 mL/min (ref >60)
Glucose, Bld: 97 mg/dL (ref 70–99)
Potassium: 4 mmol/L (ref 3.5–5.1)
Sodium: 140 mmol/L (ref 135–145)
Total Bilirubin: 0.6 mg/dL (ref 0.3–1.2)
Total Protein: 6.1 g/dL — ABNORMAL LOW (ref 6.5–8.1)

## 2021-09-15 MED ORDER — POLYETHYLENE GLYCOL 3350 17 G PO PACK
17.0000 g | PACK | Freq: Every day | ORAL | 2 refills | Status: DC | PRN
Start: 2021-09-15 — End: 2022-09-02

## 2021-09-15 MED ORDER — SODIUM CHLORIDE 0.9 % IV SOLN: Freq: Once | INTRAVENOUS | Status: AC

## 2021-09-15 MED ORDER — FAMOTIDINE 20 MG IN NS 100 ML IVPB
20.0000 mg | Freq: Once | INTRAVENOUS | Status: AC
  Administered 2021-09-15: 20 mg via INTRAVENOUS
  Filled 2021-09-15: qty 100

## 2021-09-15 MED ORDER — HEPARIN SOD (PORK) LOCK FLUSH 100 UNIT/ML IV SOLN
500.0000 [IU] | Freq: Once | INTRAVENOUS | Status: AC | PRN
  Administered 2021-09-15: 500 [IU]

## 2021-09-15 MED ORDER — PALONOSETRON HCL INJECTION 0.25 MG/5ML
0.2500 mg | Freq: Once | INTRAVENOUS | Status: AC
  Administered 2021-09-15: 0.25 mg via INTRAVENOUS
  Filled 2021-09-15: qty 5

## 2021-09-15 MED ORDER — SODIUM CHLORIDE 0.9 % IV SOLN
80.0000 mg/m2 | Freq: Once | INTRAVENOUS | Status: AC
  Administered 2021-09-15: 138 mg via INTRAVENOUS
  Filled 2021-09-15: qty 23

## 2021-09-15 MED ORDER — SODIUM CHLORIDE 0.9% FLUSH
10.0000 mL | INTRAVENOUS | Status: DC | PRN
  Administered 2021-09-15: 10 mL

## 2021-09-15 MED ORDER — DIPHENHYDRAMINE HCL 50 MG/ML IJ SOLN
25.0000 mg | Freq: Once | INTRAMUSCULAR | Status: AC
  Administered 2021-09-15: 25 mg via INTRAVENOUS
  Filled 2021-09-15: qty 1

## 2021-09-15 MED ORDER — SODIUM CHLORIDE 0.9 % IV SOLN
159.4000 mg | Freq: Once | INTRAVENOUS | Status: AC
  Administered 2021-09-15: 160 mg via INTRAVENOUS
  Filled 2021-09-15: qty 16

## 2021-09-15 MED ORDER — SODIUM CHLORIDE 0.9 % IV SOLN
10.0000 mg | Freq: Once | INTRAVENOUS | Status: AC
  Administered 2021-09-15: 10 mg via INTRAVENOUS
  Filled 2021-09-15: qty 1

## 2021-09-15 NOTE — Patient Instructions (Signed)
[name] [name]  Discharge Instructions: Thank you for choosing [facility] to provide your oncology and hematology care.   If you have a lab appointment with the [facility], please go directly to the [facility] and check in at the registration area.   Wear comfortable clothing and clothing appropriate for easy access to any Portacath or PICC line.   We strive to give you quality time with your provider. You may need to reschedule your appointment if you arrive late (15 or more minutes).  Arriving late affects you and other patients whose appointments are after yours.  Also, if you miss three or more appointments without notifying the office, you may be dismissed from the clinic at the providers discretion.      For prescription refill requests, have your pharmacy contact our office and allow 72 hours for refills to be completed.    Today you received the following chemotherapy and/or immunotherapy agents: Paclitaxel, Carboplatin      To help prevent nausea and vomiting after your treatment, we encourage you to take your nausea medication as directed.  BELOW ARE SYMPTOMS THAT SHOULD BE REPORTED IMMEDIATELY: *FEVER GREATER THAN 100.4 F (38 C) OR HIGHER *CHILLS OR SWEATING *NAUSEA AND VOMITING THAT IS NOT CONTROLLED WITH YOUR NAUSEA MEDICATION *UNUSUAL SHORTNESS OF BREATH *UNUSUAL BRUISING OR BLEEDING *URINARY PROBLEMS (pain or burning when urinating, or frequent urination) *BOWEL PROBLEMS (unusual diarrhea, constipation, pain near the anus) TENDERNESS IN MOUTH AND THROAT WITH OR WITHOUT PRESENCE OF ULCERS (sore throat, sores in mouth, or a toothache) UNUSUAL RASH, SWELLING OR PAIN  UNUSUAL VAGINAL DISCHARGE OR ITCHING   Items with * indicate a potential emergency and should be followed up as soon as possible or go to the Emergency Department if any problems should occur.  Please show the CHEMOTHERAPY ALERT CARD or IMMUNOTHERAPY ALERT CARD at  check-in to the Emergency Department and triage nurse.  Should you have questions after your visit or need to cancel or reschedule your appointment, please contact [facility]  Dept: [phone]  and follow the prompts.  Office hours are 8:00 a.m. to 4:30 p.m. Monday - Friday. Please note that voicemails left after 4:00 p.m. may not be returned until the following business day.  We are closed weekends and major holidays. You have access to a nurse at all times for urgent questions. Please call the main number to the clinic Dept: [phone] and follow the prompts.   For any non-urgent questions, you may also contact your provider using MyChart. We now offer e-Visits for anyone 78 and older to request care online for non-urgent symptoms. For details visit [email or web address].   Also download the MyChart app! Go to the app store, search "MyChart", open the app, select [facility], and log in with your MyChart username and password.  Due to Covid, a mask is required upon entering the hospital/clinic. If you do not have a mask, one will be given to you upon arrival. For doctor visits, patients may have 1 support person aged 33 or older with them. For treatment visits, patients cannot have anyone with them due to current Covid guidelines and our immunocompromised population.   Paclitaxel injection What is this medication? PACLITAXEL (PAK li TAX el) is a chemotherapy drug. It targets fast dividing cells, like cancer cells, and causes these cells to die. This medicine is used to treat ovarian cancer, breast cancer, lung cancer, Kaposi's sarcoma, and other cancers. This medicine  may be used for other purposes; ask your health care provider or pharmacist if you have questions. COMMON BRAND NAME(S): Onxol, Taxol What should I tell my care team before I take this medication? They need to know if you have any of these conditions: history of irregular heartbeat liver  disease low blood counts, like low white cell, platelet, or red cell counts lung or breathing disease, like asthma tingling of the fingers or toes, or other nerve disorder an unusual or allergic reaction to paclitaxel, alcohol, polyoxyethylated castor oil, other chemotherapy, other medicines, foods, dyes, or preservatives pregnant or trying to get pregnant breast-feeding How should I use this medication? This drug is given as an infusion into a vein. It is administered in a hospital or clinic by a specially trained health care professional. Talk to your pediatrician regarding the use of this medicine in children. Special care may be needed. Overdosage: If you think you have taken too much of this medicine contact a poison control center or emergency room at once. NOTE: This medicine is only for you. Do not share this medicine with others. What if I miss a dose? It is important not to miss your dose. Call your doctor or health care professional if you are unable to keep an appointment. What may interact with this medication? Do not take this medicine with any of the following medications: live virus vaccines This medicine may also interact with the following medications: antiviral medicines for hepatitis, HIV or AIDS certain antibiotics like erythromycin and clarithromycin certain medicines for fungal infections like ketoconazole and itraconazole certain medicines for seizures like carbamazepine, phenobarbital, phenytoin gemfibrozil nefazodone rifampin St. John's wort This list may not describe all possible interactions. Give your health care provider a list of all the medicines, herbs, non-prescription drugs, or dietary supplements you use. Also tell them if you smoke, drink alcohol, or use illegal drugs. Some items may interact with your medicine. What should I watch for while using this medication? Your condition will be monitored carefully while you are receiving this medicine. You  will need important blood work done while you are taking this medicine. This medicine can cause serious allergic reactions. To reduce your risk you will need to take other medicine(s) before treatment with this medicine. If you experience allergic reactions like skin rash, itching or hives, swelling of the face, lips, or tongue, tell your doctor or health care professional right away. In some cases, you may be given additional medicines to help with side effects. Follow all directions for their use. This drug may make you feel generally unwell. This is not uncommon, as chemotherapy can affect healthy cells as well as cancer cells. Report any side effects. Continue your course of treatment even though you feel ill unless your doctor tells you to stop. Call your doctor or health care professional for advice if you get a fever, chills or sore throat, or other symptoms of a cold or flu. Do not treat yourself. This drug decreases your body's ability to fight infections. Try to avoid being around people who are sick. This medicine may increase your risk to bruise or bleed. Call your doctor or health care professional if you notice any unusual bleeding. Be careful brushing and flossing your teeth or using a toothpick because you may get an infection or bleed more easily. If you have any dental work done, tell your dentist you are receiving this medicine. Avoid taking products that contain aspirin, acetaminophen, ibuprofen, naproxen, or ketoprofen unless instructed by  your doctor. These medicines may hide a fever. Do not become pregnant while taking this medicine. Women should inform their doctor if they wish to become pregnant or think they might be pregnant. There is a potential for serious side effects to an unborn child. Talk to your health care professional or pharmacist for more information. Do not breast-feed an infant while taking this medicine. Men are advised not to father a child while receiving this  medicine. This product may contain alcohol. Ask your pharmacist or healthcare provider if this medicine contains alcohol. Be sure to tell all healthcare providers you are taking this medicine. Certain medicines, like metronidazole and disulfiram, can cause an unpleasant reaction when taken with alcohol. The reaction includes flushing, headache, nausea, vomiting, sweating, and increased thirst. The reaction can last from 30 minutes to several hours. What side effects may I notice from receiving this medication? Side effects that you should report to your doctor or health care professional as soon as possible: allergic reactions like skin rash, itching or hives, swelling of the face, lips, or tongue breathing problems changes in vision fast, irregular heartbeat high or low blood pressure mouth sores pain, tingling, numbness in the hands or feet signs of decreased platelets or bleeding - bruising, pinpoint red spots on the skin, black, tarry stools, blood in the urine signs of decreased red blood cells - unusually weak or tired, feeling faint or lightheaded, falls signs of infection - fever or chills, cough, sore throat, pain or difficulty passing urine signs and symptoms of liver injury like dark yellow or brown urine; general ill feeling or flu-like symptoms; light-colored stools; loss of appetite; nausea; right upper belly pain; unusually weak or tired; yellowing of the eyes or skin swelling of the ankles, feet, hands unusually slow heartbeat Side effects that usually do not require medical attention (report to your doctor or health care professional if they continue or are bothersome): diarrhea hair loss loss of appetite muscle or joint pain nausea, vomiting pain, redness, or irritation at site where injected tiredness This list may not describe all possible side effects. Call your doctor for medical advice about side effects. You may report side effects to FDA at 1-800-FDA-1088. Where  should I keep my medication? This drug is given in a hospital or clinic and will not be stored at home. NOTE: This sheet is a summary. It may not cover all possible information. If you have questions about this medicine, talk to your doctor, pharmacist, or health care provider.  2022 Elsevier/Gold Standard (2021-06-09 00:00:00)  Carboplatin injection What is this medication? CARBOPLATIN (KAR boe pla tin) is a chemotherapy drug. It targets fast dividing cells, like cancer cells, and causes these cells to die. This medicine is used to treat ovarian cancer and many other cancers. This medicine may be used for other purposes; ask your health care provider or pharmacist if you have questions. COMMON BRAND NAME(S): Paraplatin What should I tell my care team before I take this medication? They need to know if you have any of these conditions: blood disorders hearing problems kidney disease recent or ongoing radiation therapy an unusual or allergic reaction to carboplatin, cisplatin, other chemotherapy, other medicines, foods, dyes, or preservatives pregnant or trying to get pregnant breast-feeding How should I use this medication? This drug is usually given as an infusion into a vein. It is administered in a hospital or clinic by a specially trained health care professional. Talk to your pediatrician regarding the use of this medicine in  children. Special care may be needed. Overdosage: If you think you have taken too much of this medicine contact a poison control center or emergency room at once. NOTE: This medicine is only for you. Do not share this medicine with others. What if I miss a dose? It is important not to miss a dose. Call your doctor or health care professional if you are unable to keep an appointment. What may interact with this medication? medicines for seizures medicines to increase blood counts like filgrastim, pegfilgrastim, sargramostim some antibiotics like amikacin,  gentamicin, neomycin, streptomycin, tobramycin vaccines Talk to your doctor or health care professional before taking any of these medicines: acetaminophen aspirin ibuprofen ketoprofen naproxen This list may not describe all possible interactions. Give your health care provider a list of all the medicines, herbs, non-prescription drugs, or dietary supplements you use. Also tell them if you smoke, drink alcohol, or use illegal drugs. Some items may interact with your medicine. What should I watch for while using this medication? Your condition will be monitored carefully while you are receiving this medicine. You will need important blood work done while you are taking this medicine. This drug may make you feel generally unwell. This is not uncommon, as chemotherapy can affect healthy cells as well as cancer cells. Report any side effects. Continue your course of treatment even though you feel ill unless your doctor tells you to stop. In some cases, you may be given additional medicines to help with side effects. Follow all directions for their use. Call your doctor or health care professional for advice if you get a fever, chills or sore throat, or other symptoms of a cold or flu. Do not treat yourself. This drug decreases your body's ability to fight infections. Try to avoid being around people who are sick. This medicine may increase your risk to bruise or bleed. Call your doctor or health care professional if you notice any unusual bleeding. Be careful brushing and flossing your teeth or using a toothpick because you may get an infection or bleed more easily. If you have any dental work done, tell your dentist you are receiving this medicine. Avoid taking products that contain aspirin, acetaminophen, ibuprofen, naproxen, or ketoprofen unless instructed by your doctor. These medicines may hide a fever. Do not become pregnant while taking this medicine. Women should inform their doctor if they wish  to become pregnant or think they might be pregnant. There is a potential for serious side effects to an unborn child. Talk to your health care professional or pharmacist for more information. Do not breast-feed an infant while taking this medicine. What side effects may I notice from receiving this medication? Side effects that you should report to your doctor or health care professional as soon as possible: allergic reactions like skin rash, itching or hives, swelling of the face, lips, or tongue signs of infection - fever or chills, cough, sore throat, pain or difficulty passing urine signs of decreased platelets or bleeding - bruising, pinpoint red spots on the skin, black, tarry stools, nosebleeds signs of decreased red blood cells - unusually weak or tired, fainting spells, lightheadedness breathing problems changes in hearing changes in vision chest pain high blood pressure low blood counts - This drug may decrease the number of white blood cells, red blood cells and platelets. You may be at increased risk for infections and bleeding. nausea and vomiting pain, swelling, redness or irritation at the injection site pain, tingling, numbness in the hands or feet  problems with balance, talking, walking trouble passing urine or change in the amount of urine Side effects that usually do not require medical attention (report to your doctor or health care professional if they continue or are bothersome): hair loss loss of appetite metallic taste in the mouth or changes in taste This list may not describe all possible side effects. Call your doctor for medical advice about side effects. You may report side effects to FDA at 1-800-FDA-1088. Where should I keep my medication? This drug is given in a hospital or clinic and will not be stored at home. NOTE: This sheet is a summary. It may not cover all possible information. If you have questions about this medicine, talk to your doctor, pharmacist,  or health care provider.  2022 Elsevier/Gold Standard (2008-02-28 00:00:00)

## 2021-09-15 NOTE — Progress Notes (Signed)
Patient seen by Lisa Thomas NP today  Vitals are within treatment parameters.  Labs reviewed by Lisa Thomas NP and are within treatment parameters.  Per physician team, patient is ready for treatment and there are NO modifications to the treatment plan.     

## 2021-09-15 NOTE — Progress Notes (Signed)
Patient presents for treatment. RN assessment completed along with the following:  Labs/vitals reviewed - Yes, and within treatment parameters.   Weight within 10% of previous measurement - Yes Oncology Treatment Attestation completed for current therapy- Yes, on date 08/17/21 Informed consent completed and reflects current therapy/intent - Yes, on date 09/09/21             Provider progress note reviewed - Today's provider note is not yet available. I reviewed the most recent oncology provider progress note in chart dated 09/08/21. Treatment/Antibody/Supportive plan reviewed - Yes, and there are no adjustments needed for today's treatment. S&H and other orders reviewed - Yes, and there are no additional orders identified. Previous treatment date reviewed - Yes, and the appropriate amount of time has elapsed between treatments.  Patient to proceed with treatment.

## 2021-09-15 NOTE — Progress Notes (Signed)
[facility] OFFICE PROGRESS NOTE   Diagnosis: Ovarian cancer  INTERVAL HISTORY:   [name] [name] returns as scheduled.  She completed cycle 1 week 1 Taxol/carboplatin 09/09/2021.  She denies nausea/vomiting.  No mouth sores.  No diarrhea.  She developed constipation around day 2, 3.  She increased fluid intake.  Bowels now moving regularly.  No signs of allergic reaction.  Objective:  Vital signs in last 24 hours:  Blood pressure 115/70, pulse 83, temperature 98.7 F (37.1 C), temperature source Oral, resp. rate 18, height '5\' 2"'  (1.575 m), weight 151 lb 12.8 oz (68.9 kg), SpO2 98 %.    HEENT: No thrush or ulcers. Resp: Lungs clear bilaterally. Cardio: Regular rate and rhythm. GI: Abdomen soft and nontender.  No hepatomegaly. Vascular: No leg edema. Port-A-Cath without erythema.  Lab Results:  Lab Results  Component Value Date   WBC 5.5 09/15/2021   HGB 12.8 09/15/2021   HCT 40.1 09/15/2021   MCV 99.8 09/15/2021   PLT 246 09/15/2021   NEUTROABS 4.4 09/15/2021    Imaging:  No results found.  Medications: I have reviewed the patient's current medications.  Assessment/Plan:  1. 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 A 5 mm nodules remain on the right diaphragm. - TumorNext paired germline/tumor analyses: No somatic variants detected, germline CHEK2      VUS      Cycle 1 of adjuvant Taxol/carboplatin chemotherapy initiated on 09/19/2012.   The CA 125 normalized.   She completed day 15 of cycle 6 on 02/06/2013.   Restaging CT evaluation 03/29/2013 showed no evidence of metastatic disease in the chest. There was marked improvement in appearance/resolution of previous described peritoneal/omental disease. There was no convincing evidence of residual disease. There was minimal increased density in the region of  the omentum favored to be treatment-related. There was no pelvic adenopathy. CA125 3.2 on 02/19/2014. 08/18/2016 CA-125 8 CT abdomen/pelvis 08/25/2016-solitary new enlarged right external iliac lymph node measuring 2.2 cm. Biopsy 09/07/2016-adenocarcinoma consistent with high-grade serous carcinoma. PET scan 09/22/2016-malignant range FDG uptake associated with the enlarged right external iliac lymph node compatible with metastatic adenopathy.  No additional sites of metastatic disease identified. Radiation 10/25/2016-12/01/2016 PET scan 03/28/2017-previously enlarged hypermetabolic right external iliac node-normal in size with resolution of hypermetabolic activity, no active malignancy identified PET scan 05/15/2018-no evidence of recurrent or metastatic disease, no hypermetabolic lymph nodes CT abdomen/pelvis 09/19/2019-mild increase in the size of several left upper quadrant peritoneal nodules measuring up to 11 mm.  No other sites of metastatic disease identified.  No ascites. CT abdomen/pelvis 03/14/2020-slight enlargement of several left upper quadrant peritoneal nodules, no ascites, no other evidence of disease progression CT abdomen/pelvis 09/12/2020 peritoneal nodularity/omental caking predominantly in the left upper/mid abdomen, mildly progressive.  Largest implant in the left upper abdomen adjacent to the stomach now measures 17 mm, previously 13 mm.  Overall volume of peritoneal disease has mildly progressed. CT abdomen/pelvis 01/13/2021- 4 mm right ureteral calculus with moderate right hydronephrosis and upper right hydroureter, progressive omental nodularity, stable calcified and partially calcified right pelvic nodules CT abdomen/pelvis 05/13/2021-enlargement of left upper quadrant omental nodules and a peritoneal nodule at the right iliac fossa, no ascites CT abdomen/pelvis 08/14/2021-mild increase in size of omental nodules, no ascites, no new site of metastatic disease Taxol/carboplatin  09/09/2021 Taxol/carboplatin 09/15/2021   2. Low abdomen/suprapubic pain prior to the exploratory laparotomy-likely secondary to omental/pelvic tumor; persistent  mild pain in the lower abdomen   3. Chronic neck and back pain.   4. Anxiety -persistent despite Lexapro and Xanax. She has been evaluated by psychiatry. Currently on Lexapro. 5. Status post Port-A-Cath placement 09/22/2012. The Port-A-Cath was removed on 04/03/2013.   6. Neutropenia secondary to chemotherapy- day 15 cycle 1 and cycle 3. Taxol/carboplatin not given secondary to neutropenia.    7. Herpes zoster involving a right thoracic dermatome July 2015. She completed a course of Valtrex. 8. Nodular bony prominence at the left pelvis on rectal exam 06/05/2015-likely a benign finding 9.  Right ureter stone/hydroureteronephrosis on CT 01/13/2021-referred to urology; status post right ureteroscopy with stone extraction and ureteral stent placement.  Stent removal 03/11/2021    Disposition: [name] [name] appears stable.  She completed cycle 1 weekly Taxol/carboplatin well.  No signs of allergic reaction.  She had mild constipation.  She will try MiraLAX if this occurs again.  Plan to proceed with week 2 today as scheduled.  CBC and chemistry panel reviewed.  Labs adequate to proceed with treatment.  She will return for lab and week 3 Taxol/carboplatin in 1 week.  She will return for lab, follow-up, Taxol/carboplatin in 3 weeks.  We are available to see her sooner if needed.    Ned Card ANP/GNP-BC   09/15/2021  9:20 AM

## 2021-09-15 NOTE — Patient Instructions (Signed)

## 2021-09-16 ENCOUNTER — Other Ambulatory Visit: Payer: Self-pay | Admitting: *Deleted

## 2021-09-16 DIAGNOSIS — C569 Malignant neoplasm of unspecified ovary: Secondary | ICD-10-CM

## 2021-09-17 ENCOUNTER — Telehealth: Payer: Self-pay

## 2021-09-17 NOTE — Telephone Encounter (Signed)
TC from Pt stating she received a prescription from Leander Rams NP for a wig. Pt stated that it needed to be faxed to [facility] Fax # [phone] Prescription faxed today.

## 2021-09-18 ENCOUNTER — Ambulatory Visit: Payer: Medicare Other | Admitting: Gynecologic Oncology

## 2021-09-18 DIAGNOSIS — C569 Malignant neoplasm of unspecified ovary: Secondary | ICD-10-CM | POA: Diagnosis not present

## 2021-09-20 ENCOUNTER — Other Ambulatory Visit: Payer: Self-pay | Admitting: Oncology

## 2021-09-22 ENCOUNTER — Inpatient Hospital Stay: Payer: Medicare Other

## 2021-09-22 ENCOUNTER — Other Ambulatory Visit: Payer: Self-pay

## 2021-09-22 VITALS — BP 133/78 | HR 77 | Temp 97.6°F | Resp 18 | Ht 62.0 in | Wt 152.1 lb

## 2021-09-22 DIAGNOSIS — C569 Malignant neoplasm of unspecified ovary: Secondary | ICD-10-CM

## 2021-09-22 DIAGNOSIS — I700 Atherosclerosis of aorta: Secondary | ICD-10-CM | POA: Diagnosis not present

## 2021-09-22 DIAGNOSIS — R590 Localized enlarged lymph nodes: Secondary | ICD-10-CM | POA: Diagnosis not present

## 2021-09-22 DIAGNOSIS — Z79899 Other long term (current) drug therapy: Secondary | ICD-10-CM | POA: Diagnosis not present

## 2021-09-22 DIAGNOSIS — F419 Anxiety disorder, unspecified: Secondary | ICD-10-CM | POA: Diagnosis not present

## 2021-09-22 DIAGNOSIS — Z8249 Family history of ischemic heart disease and other diseases of the circulatory system: Secondary | ICD-10-CM | POA: Diagnosis not present

## 2021-09-22 DIAGNOSIS — Z5111 Encounter for antineoplastic chemotherapy: Secondary | ICD-10-CM | POA: Diagnosis not present

## 2021-09-22 DIAGNOSIS — Z90721 Acquired absence of ovaries, unilateral: Secondary | ICD-10-CM | POA: Diagnosis not present

## 2021-09-22 DIAGNOSIS — Z8262 Family history of osteoporosis: Secondary | ICD-10-CM | POA: Diagnosis not present

## 2021-09-22 DIAGNOSIS — Z90710 Acquired absence of both cervix and uterus: Secondary | ICD-10-CM | POA: Diagnosis not present

## 2021-09-22 DIAGNOSIS — N816 Rectocele: Secondary | ICD-10-CM | POA: Diagnosis not present

## 2021-09-22 DIAGNOSIS — M069 Rheumatoid arthritis, unspecified: Secondary | ICD-10-CM | POA: Diagnosis not present

## 2021-09-22 DIAGNOSIS — D701 Agranulocytosis secondary to cancer chemotherapy: Secondary | ICD-10-CM | POA: Diagnosis not present

## 2021-09-22 DIAGNOSIS — E7800 Pure hypercholesterolemia, unspecified: Secondary | ICD-10-CM | POA: Diagnosis not present

## 2021-09-22 DIAGNOSIS — R110 Nausea: Secondary | ICD-10-CM | POA: Diagnosis not present

## 2021-09-22 DIAGNOSIS — T451X5A Adverse effect of antineoplastic and immunosuppressive drugs, initial encounter: Secondary | ICD-10-CM | POA: Diagnosis not present

## 2021-09-22 LAB — CBC WITH DIFFERENTIAL (CANCER CENTER ONLY)
Abs Immature Granulocytes: 0.04 10*3/uL (ref 0.00–0.07)
Basophils Absolute: 0 10*3/uL (ref 0.0–0.1)
Basophils Relative: 1 %
Eosinophils Absolute: 0.1 10*3/uL (ref 0.0–0.5)
Eosinophils Relative: 2 %
HCT: 39.3 % (ref 36.0–46.0)
Hemoglobin: 12.5 g/dL (ref 12.0–15.0)
Immature Granulocytes: 1 %
Lymphocytes Relative: 25 %
Lymphs Abs: 0.7 10*3/uL (ref 0.7–4.0)
MCH: 32.1 pg (ref 26.0–34.0)
MCHC: 31.8 g/dL (ref 30.0–36.0)
MCV: 101 fL — ABNORMAL HIGH (ref 80.0–100.0)
Monocytes Absolute: 0.4 10*3/uL (ref 0.1–1.0)
Monocytes Relative: 13 %
Neutro Abs: 1.6 10*3/uL — ABNORMAL LOW (ref 1.7–7.7)
Neutrophils Relative %: 58 %
Platelet Count: 233 10*3/uL (ref 150–400)
RBC: 3.89 MIL/uL (ref 3.87–5.11)
RDW: 13.4 % (ref 11.5–15.5)
WBC Count: 2.8 10*3/uL — ABNORMAL LOW (ref 4.0–10.5)
nRBC: 0 % (ref 0.0–0.2)

## 2021-09-22 LAB — CMP (CANCER CENTER ONLY)
ALT: 31 U/L (ref 0–44)
AST: 21 U/L (ref 15–41)
Albumin: 3.6 g/dL (ref 3.5–5.0)
Alkaline Phosphatase: 47 U/L (ref 38–126)
Anion gap: 6 (ref 5–15)
BUN: 22 mg/dL (ref 8–23)
CO2: 26 mmol/L (ref 22–32)
Calcium: 8.5 mg/dL — ABNORMAL LOW (ref 8.9–10.3)
Chloride: 109 mmol/L (ref 98–111)
Creatinine: 0.53 mg/dL (ref 0.44–1.00)
GFR, Estimated: >60 mL/min (ref >60)
Glucose, Bld: 91 mg/dL (ref 70–99)
Potassium: 3.8 mmol/L (ref 3.5–5.1)
Sodium: 141 mmol/L (ref 135–145)
Total Bilirubin: 0.3 mg/dL (ref 0.3–1.2)
Total Protein: 5.7 g/dL — ABNORMAL LOW (ref 6.5–8.1)

## 2021-09-22 MED ORDER — SODIUM CHLORIDE 0.9 % IV SOLN
Freq: Once | INTRAVENOUS | Status: AC
Start: 2021-09-22 — End: 2021-09-22

## 2021-09-22 MED ORDER — SODIUM CHLORIDE 0.9 % IV SOLN
10.0000 mg | Freq: Once | INTRAVENOUS | Status: AC
  Administered 2021-09-22: 10:00:00 10 mg via INTRAVENOUS
  Filled 2021-09-22: qty 1

## 2021-09-22 MED ORDER — PALONOSETRON HCL INJECTION 0.25 MG/5ML
0.2500 mg | Freq: Once | INTRAVENOUS | Status: AC
  Administered 2021-09-22: 10:00:00 0.25 mg via INTRAVENOUS
  Filled 2021-09-22: qty 5

## 2021-09-22 MED ORDER — SODIUM CHLORIDE 0.9 % IV SOLN
159.4000 mg | Freq: Once | INTRAVENOUS | Status: AC
  Administered 2021-09-22: 12:00:00 160 mg via INTRAVENOUS
  Filled 2021-09-22: qty 16

## 2021-09-22 MED ORDER — DIPHENHYDRAMINE HCL 50 MG/ML IJ SOLN
25.0000 mg | Freq: Once | INTRAMUSCULAR | Status: AC
  Administered 2021-09-22: 10:00:00 25 mg via INTRAVENOUS
  Filled 2021-09-22: qty 1

## 2021-09-22 MED ORDER — FAMOTIDINE 20 MG IN NS 100 ML IVPB
20.0000 mg | Freq: Once | INTRAVENOUS | Status: AC
  Administered 2021-09-22: 10:00:00 20 mg via INTRAVENOUS
  Filled 2021-09-22: qty 100

## 2021-09-22 MED ORDER — HEPARIN SOD (PORK) LOCK FLUSH 100 UNIT/ML IV SOLN
500.0000 [IU] | Freq: Once | INTRAVENOUS | Status: AC | PRN
  Administered 2021-09-22: 13:00:00 500 [IU]

## 2021-09-22 MED ORDER — SODIUM CHLORIDE 0.9% FLUSH
10.0000 mL | INTRAVENOUS | Status: DC | PRN
  Administered 2021-09-22: 13:00:00 10 mL

## 2021-09-22 MED ORDER — SODIUM CHLORIDE 0.9 % IV SOLN
80.0000 mg/m2 | Freq: Once | INTRAVENOUS | Status: AC
  Administered 2021-09-22: 11:00:00 138 mg via INTRAVENOUS
  Filled 2021-09-22: qty 23

## 2021-09-22 NOTE — Patient Instructions (Signed)
[name] [name]   Discharge Instructions: Thank you for choosing [facility] to provide your oncology and hematology care.   If you have a lab appointment with the [facility], please go directly to the [facility] and check in at the registration area.   Wear comfortable clothing and clothing appropriate for easy access to any Portacath or PICC line.   We strive to give you quality time with your provider. You may need to reschedule your appointment if you arrive late (15 or more minutes).  Arriving late affects you and other patients whose appointments are after yours.  Also, if you miss three or more appointments without notifying the office, you may be dismissed from the clinic at the providers discretion.      For prescription refill requests, have your pharmacy contact our office and allow 72 hours for refills to be completed.    Today you received the following chemotherapy and/or immunotherapy agents Paclitaxel (TAXOL) & Carboplatin (PARAPLATIN).      To help prevent nausea and vomiting after your treatment, we encourage you to take your nausea medication as directed.  BELOW ARE SYMPTOMS THAT SHOULD BE REPORTED IMMEDIATELY: *FEVER GREATER THAN 100.4 F (38 C) OR HIGHER *CHILLS OR SWEATING *NAUSEA AND VOMITING THAT IS NOT CONTROLLED WITH YOUR NAUSEA MEDICATION *UNUSUAL SHORTNESS OF BREATH *UNUSUAL BRUISING OR BLEEDING *URINARY PROBLEMS (pain or burning when urinating, or frequent urination) *BOWEL PROBLEMS (unusual diarrhea, constipation, pain near the anus) TENDERNESS IN MOUTH AND THROAT WITH OR WITHOUT PRESENCE OF ULCERS (sore throat, sores in mouth, or a toothache) UNUSUAL RASH, SWELLING OR PAIN  UNUSUAL VAGINAL DISCHARGE OR ITCHING   Items with * indicate a potential emergency and should be followed up as soon as possible or go to the Emergency Department if any problems should occur.  Please show the CHEMOTHERAPY ALERT CARD or  IMMUNOTHERAPY ALERT CARD at check-in to the Emergency Department and triage nurse.  Should you have questions after your visit or need to cancel or reschedule your appointment, please contact [facility]  Dept: [phone]  and follow the prompts.  Office hours are 8:00 a.m. to 4:30 p.m. Monday - Friday. Please note that voicemails left after 4:00 p.m. may not be returned until the following business day.  We are closed weekends and major holidays. You have access to a nurse at all times for urgent questions. Please call the main number to the clinic Dept: [phone] and follow the prompts.   For any non-urgent questions, you may also contact your provider using MyChart. We now offer e-Visits for anyone 50 and older to request care online for non-urgent symptoms. For details visit [email or web address].   Also download the MyChart app! Go to the app store, search "MyChart", open the app, select [facility], and log in with your MyChart username and password.  Due to Covid, a mask is required upon entering the hospital/clinic. If you do not have a mask, one will be given to you upon arrival. For doctor visits, patients may have 1 support person aged 34 or older with them. For treatment visits, patients cannot have anyone with them due to current Covid guidelines and our immunocompromised population.   Paclitaxel injection What is this medication? PACLITAXEL (PAK li TAX el) is a chemotherapy drug. It targets fast dividing cells, like cancer cells, and causes these cells to die. This medicine is used to treat ovarian cancer, breast cancer, lung cancer, Kaposi's sarcoma, and  other cancers. This medicine may be used for other purposes; ask your health care provider or pharmacist if you have questions. COMMON BRAND NAME(S): Onxol, Taxol What should I tell my care team before I take this medication? They need to know if you have any of these conditions: history of irregular  heartbeat liver disease low blood counts, like low white cell, platelet, or red cell counts lung or breathing disease, like asthma tingling of the fingers or toes, or other nerve disorder an unusual or allergic reaction to paclitaxel, alcohol, polyoxyethylated castor oil, other chemotherapy, other medicines, foods, dyes, or preservatives pregnant or trying to get pregnant breast-feeding How should I use this medication? This drug is given as an infusion into a vein. It is administered in a hospital or clinic by a specially trained health care professional. Talk to your pediatrician regarding the use of this medicine in children. Special care may be needed. Overdosage: If you think you have taken too much of this medicine contact a poison control center or emergency room at once. NOTE: This medicine is only for you. Do not share this medicine with others. What if I miss a dose? It is important not to miss your dose. Call your doctor or health care professional if you are unable to keep an appointment. What may interact with this medication? Do not take this medicine with any of the following medications: live virus vaccines This medicine may also interact with the following medications: antiviral medicines for hepatitis, HIV or AIDS certain antibiotics like erythromycin and clarithromycin certain medicines for fungal infections like ketoconazole and itraconazole certain medicines for seizures like carbamazepine, phenobarbital, phenytoin gemfibrozil nefazodone rifampin St. John's wort This list may not describe all possible interactions. Give your health care provider a list of all the medicines, herbs, non-prescription drugs, or dietary supplements you use. Also tell them if you smoke, drink alcohol, or use illegal drugs. Some items may interact with your medicine. What should I watch for while using this medication? Your condition will be monitored carefully while you are receiving this  medicine. You will need important blood work done while you are taking this medicine. This medicine can cause serious allergic reactions. To reduce your risk you will need to take other medicine(s) before treatment with this medicine. If you experience allergic reactions like skin rash, itching or hives, swelling of the face, lips, or tongue, tell your doctor or health care professional right away. In some cases, you may be given additional medicines to help with side effects. Follow all directions for their use. This drug may make you feel generally unwell. This is not uncommon, as chemotherapy can affect healthy cells as well as cancer cells. Report any side effects. Continue your course of treatment even though you feel ill unless your doctor tells you to stop. Call your doctor or health care professional for advice if you get a fever, chills or sore throat, or other symptoms of a cold or flu. Do not treat yourself. This drug decreases your body's ability to fight infections. Try to avoid being around people who are sick. This medicine may increase your risk to bruise or bleed. Call your doctor or health care professional if you notice any unusual bleeding. Be careful brushing and flossing your teeth or using a toothpick because you may get an infection or bleed more easily. If you have any dental work done, tell your dentist you are receiving this medicine. Avoid taking products that contain aspirin, acetaminophen, ibuprofen, naproxen, or  ketoprofen unless instructed by your doctor. These medicines may hide a fever. Do not become pregnant while taking this medicine. Women should inform their doctor if they wish to become pregnant or think they might be pregnant. There is a potential for serious side effects to an unborn child. Talk to your health care professional or pharmacist for more information. Do not breast-feed an infant while taking this medicine. Men are advised not to father a child while  receiving this medicine. This product may contain alcohol. Ask your pharmacist or healthcare provider if this medicine contains alcohol. Be sure to tell all healthcare providers you are taking this medicine. Certain medicines, like metronidazole and disulfiram, can cause an unpleasant reaction when taken with alcohol. The reaction includes flushing, headache, nausea, vomiting, sweating, and increased thirst. The reaction can last from 30 minutes to several hours. What side effects may I notice from receiving this medication? Side effects that you should report to your doctor or health care professional as soon as possible: allergic reactions like skin rash, itching or hives, swelling of the face, lips, or tongue breathing problems changes in vision fast, irregular heartbeat high or low blood pressure mouth sores pain, tingling, numbness in the hands or feet signs of decreased platelets or bleeding - bruising, pinpoint red spots on the skin, black, tarry stools, blood in the urine signs of decreased red blood cells - unusually weak or tired, feeling faint or lightheaded, falls signs of infection - fever or chills, cough, sore throat, pain or difficulty passing urine signs and symptoms of liver injury like dark yellow or brown urine; general ill feeling or flu-like symptoms; light-colored stools; loss of appetite; nausea; right upper belly pain; unusually weak or tired; yellowing of the eyes or skin swelling of the ankles, feet, hands unusually slow heartbeat Side effects that usually do not require medical attention (report to your doctor or health care professional if they continue or are bothersome): diarrhea hair loss loss of appetite muscle or joint pain nausea, vomiting pain, redness, or irritation at site where injected tiredness This list may not describe all possible side effects. Call your doctor for medical advice about side effects. You may report side effects to FDA at  1-800-FDA-1088. Where should I keep my medication? This drug is given in a hospital or clinic and will not be stored at home. NOTE: This sheet is a summary. It may not cover all possible information. If you have questions about this medicine, talk to your doctor, pharmacist, or health care provider.  2022 Elsevier/Gold Standard (2021-06-09 00:00:00)  Carboplatin injection What is this medication? CARBOPLATIN (KAR boe pla tin) is a chemotherapy drug. It targets fast dividing cells, like cancer cells, and causes these cells to die. This medicine is used to treat ovarian cancer and many other cancers. This medicine may be used for other purposes; ask your health care provider or pharmacist if you have questions. COMMON BRAND NAME(S): Paraplatin What should I tell my care team before I take this medication? They need to know if you have any of these conditions: blood disorders hearing problems kidney disease recent or ongoing radiation therapy an unusual or allergic reaction to carboplatin, cisplatin, other chemotherapy, other medicines, foods, dyes, or preservatives pregnant or trying to get pregnant breast-feeding How should I use this medication? This drug is usually given as an infusion into a vein. It is administered in a hospital or clinic by a specially trained health care professional. Talk to your pediatrician regarding the use  of this medicine in children. Special care may be needed. Overdosage: If you think you have taken too much of this medicine contact a poison control center or emergency room at once. NOTE: This medicine is only for you. Do not share this medicine with others. What if I miss a dose? It is important not to miss a dose. Call your doctor or health care professional if you are unable to keep an appointment. What may interact with this medication? medicines for seizures medicines to increase blood counts like filgrastim, pegfilgrastim, sargramostim some  antibiotics like amikacin, gentamicin, neomycin, streptomycin, tobramycin vaccines Talk to your doctor or health care professional before taking any of these medicines: acetaminophen aspirin ibuprofen ketoprofen naproxen This list may not describe all possible interactions. Give your health care provider a list of all the medicines, herbs, non-prescription drugs, or dietary supplements you use. Also tell them if you smoke, drink alcohol, or use illegal drugs. Some items may interact with your medicine. What should I watch for while using this medication? Your condition will be monitored carefully while you are receiving this medicine. You will need important blood work done while you are taking this medicine. This drug may make you feel generally unwell. This is not uncommon, as chemotherapy can affect healthy cells as well as cancer cells. Report any side effects. Continue your course of treatment even though you feel ill unless your doctor tells you to stop. In some cases, you may be given additional medicines to help with side effects. Follow all directions for their use. Call your doctor or health care professional for advice if you get a fever, chills or sore throat, or other symptoms of a cold or flu. Do not treat yourself. This drug decreases your body's ability to fight infections. Try to avoid being around people who are sick. This medicine may increase your risk to bruise or bleed. Call your doctor or health care professional if you notice any unusual bleeding. Be careful brushing and flossing your teeth or using a toothpick because you may get an infection or bleed more easily. If you have any dental work done, tell your dentist you are receiving this medicine. Avoid taking products that contain aspirin, acetaminophen, ibuprofen, naproxen, or ketoprofen unless instructed by your doctor. These medicines may hide a fever. Do not become pregnant while taking this medicine. Women should  inform their doctor if they wish to become pregnant or think they might be pregnant. There is a potential for serious side effects to an unborn child. Talk to your health care professional or pharmacist for more information. Do not breast-feed an infant while taking this medicine. What side effects may I notice from receiving this medication? Side effects that you should report to your doctor or health care professional as soon as possible: allergic reactions like skin rash, itching or hives, swelling of the face, lips, or tongue signs of infection - fever or chills, cough, sore throat, pain or difficulty passing urine signs of decreased platelets or bleeding - bruising, pinpoint red spots on the skin, black, tarry stools, nosebleeds signs of decreased red blood cells - unusually weak or tired, fainting spells, lightheadedness breathing problems changes in hearing changes in vision chest pain high blood pressure low blood counts - This drug may decrease the number of white blood cells, red blood cells and platelets. You may be at increased risk for infections and bleeding. nausea and vomiting pain, swelling, redness or irritation at the injection site pain, tingling, numbness in  the hands or feet problems with balance, talking, walking trouble passing urine or change in the amount of urine Side effects that usually do not require medical attention (report to your doctor or health care professional if they continue or are bothersome): hair loss loss of appetite metallic taste in the mouth or changes in taste This list may not describe all possible side effects. Call your doctor for medical advice about side effects. You may report side effects to FDA at 1-800-FDA-1088. Where should I keep my medication? This drug is given in a hospital or clinic and will not be stored at home. NOTE: This sheet is a summary. It may not cover all possible information. If you have questions about this medicine,  talk to your doctor, pharmacist, or health care provider.  2022 Elsevier/Gold Standard (2008-02-28 00:00:00)

## 2021-09-22 NOTE — Progress Notes (Signed)
Patient presents for treatment. RN assessment completed along with the following:  Labs/vitals reviewed - Yes, and within treatment parameters.   Weight within 10% of previous measurement - Yes Oncology Treatment Attestation completed for current therapy- Yes, on date 08/17/2021 Informed consent completed and reflects current therapy/intent - Yes, on date 09/09/2021             Provider progress note reviewed - Patient not seen by provider today. Most recent note dated 09/15/2021 reviewed. Treatment/Antibody/Supportive plan reviewed - Yes, and there are no adjustments needed for today's treatment. S&H and other orders reviewed - Yes, and there are no additional orders identified. Previous treatment date reviewed - Yes, and the appropriate amount of time has elapsed between treatments. Clinic Hand Off Received from - No.  Patient to proceed with treatment.

## 2021-09-25 ENCOUNTER — Inpatient Hospital Stay (HOSPITAL_BASED_OUTPATIENT_CLINIC_OR_DEPARTMENT_OTHER): Payer: Medicare Other | Admitting: Gynecologic Oncology

## 2021-09-25 ENCOUNTER — Other Ambulatory Visit: Payer: Self-pay

## 2021-09-25 ENCOUNTER — Encounter: Payer: Self-pay | Admitting: Gynecologic Oncology

## 2021-09-25 VITALS — BP 121/85 | HR 85 | Temp 97.0°F | Resp 16 | Ht 62.0 in | Wt 147.0 lb

## 2021-09-25 DIAGNOSIS — D701 Agranulocytosis secondary to cancer chemotherapy: Secondary | ICD-10-CM | POA: Diagnosis not present

## 2021-09-25 DIAGNOSIS — I700 Atherosclerosis of aorta: Secondary | ICD-10-CM | POA: Diagnosis not present

## 2021-09-25 DIAGNOSIS — R590 Localized enlarged lymph nodes: Secondary | ICD-10-CM | POA: Diagnosis not present

## 2021-09-25 DIAGNOSIS — C569 Malignant neoplasm of unspecified ovary: Secondary | ICD-10-CM

## 2021-09-25 DIAGNOSIS — R110 Nausea: Secondary | ICD-10-CM | POA: Diagnosis not present

## 2021-09-25 DIAGNOSIS — T451X5A Adverse effect of antineoplastic and immunosuppressive drugs, initial encounter: Secondary | ICD-10-CM | POA: Diagnosis not present

## 2021-09-25 DIAGNOSIS — F419 Anxiety disorder, unspecified: Secondary | ICD-10-CM | POA: Diagnosis not present

## 2021-09-25 DIAGNOSIS — Z8262 Family history of osteoporosis: Secondary | ICD-10-CM | POA: Diagnosis not present

## 2021-09-25 DIAGNOSIS — Z90721 Acquired absence of ovaries, unilateral: Secondary | ICD-10-CM | POA: Diagnosis not present

## 2021-09-25 DIAGNOSIS — N816 Rectocele: Secondary | ICD-10-CM | POA: Diagnosis not present

## 2021-09-25 DIAGNOSIS — Z8249 Family history of ischemic heart disease and other diseases of the circulatory system: Secondary | ICD-10-CM | POA: Diagnosis not present

## 2021-09-25 DIAGNOSIS — Z79899 Other long term (current) drug therapy: Secondary | ICD-10-CM | POA: Diagnosis not present

## 2021-09-25 DIAGNOSIS — Z90710 Acquired absence of both cervix and uterus: Secondary | ICD-10-CM | POA: Diagnosis not present

## 2021-09-25 DIAGNOSIS — Z5111 Encounter for antineoplastic chemotherapy: Secondary | ICD-10-CM | POA: Diagnosis not present

## 2021-09-25 DIAGNOSIS — E7800 Pure hypercholesterolemia, unspecified: Secondary | ICD-10-CM | POA: Diagnosis not present

## 2021-09-25 DIAGNOSIS — M069 Rheumatoid arthritis, unspecified: Secondary | ICD-10-CM | POA: Diagnosis not present

## 2021-09-25 NOTE — Patient Instructions (Signed)
It was good to see you today.  I am glad you are doing so well on treatment.  Please call to schedule a visit with me after you finish treatment (about 3 months after).  Happy holidays!

## 2021-09-25 NOTE — Progress Notes (Signed)
Gynecologic Oncology Return Clinic Visit  09/25/2021  Reason for Visit: Follow-up in the setting of ovarian cancer  Treatment History: Oncology History Overview Note  CA-125 10/10/113: 68 02/2013: 5.6, post adjuvant chemotherapy (03/2013-02/2016): 3.2-8.8 08/2016: 10.2, just prior to recurrence diagnosis 02/14/17: 7.1, 3 months post completion of RT 09/17/19: 6.5  In 2014, presented with pelvic pain and pressure, urinary frequency.  In 08/2016, presented with 3 months of constipation and RLQ pain. On exam, had some fullness and tenderness along right pelvic sidewall.    Ovarian cancer ([name])  07/12/2012 Imaging   PET: significant soft tissue thickening seen throughout the greater omentum suspicious for intraperitoneal carcinoma. There is minimal ascites in the pelvis. it revealed the areas of omental and peritoneal nodularity were hypermetabolic. There is a dominant pelvic omental mass measuring 10.4 x 3.3 cm. There is a small omental implants in the left side of the abdomen measuring 1.1 cm. There were multiple pelvic foci That wereperitoneal based And hypermetabolic. These included the cul-de-sac. She has small volume of ascites.    07/24/2012 Initial Biopsy   CT-guided revealed metastatic carcinoma. By [facility], malignant cells were positive for WT 1, cytokeratin 7, estrogen receptor. There were negative for TTF-1, progesterone receptor, cytokeratin 20, CEA, CD X2, and S100. Overall, the histologic features were highly suggestive of a primary gynecologic origin.   08/22/2012 Surgery   Radical debulking - TAH/BSO, ureterolysis, omentectomy, stripping of pelvic peritoneum, omentectomy, and rectosigmoid resection with low rectal reanastomosis Findings: stage IIIC ovarian cancer. Patient was optimally debulked. Debulking, however, required a rectosigmoid resection as well as total omentectomy total nominal hysterectomy bilateral salpingo-oophorectomy. She was optimally debulked with only 5 mm  nodules on the right diaphragm as residual disease.    08/23/2012 Initial Diagnosis   Ovarian cancer ([name])   09/2012 - 02/06/2013 Chemotherapy   6 cycles of carboplatin/taxol    03/29/2013 Imaging   CT C/A/P: 1.  Marked response to therapy of omental/peritoneal disease.  No residual measurable disease identified. 2.  Interval hysterectomy and bilateral oophrectomy.   08/25/2016 Imaging   CT A/P: 1. Solitary new enlarged right external iliac lymph node, suspicious for metastatic nodal recurrence. 2. No additional sites of metastatic disease in the abdomen or pelvis. 3. Aortic atherosclerosis.   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 There is malignant range FDG uptake associated with the enlarged right external iliac lymph node compatible with metastatic adenopathy. 2. No additional sites of metastatic disease identified. 3. Aortic atherosclerosis.   10/25/2016 - 12/01/2016 Radiation Therapy   Radiation to right pelvis treated to 60.2Gy in 28 fractions   03/28/2017 Imaging   PET post RT: 1. The previously enlarged and hypermetabolic right external iliac lymph node is currently of normal size and the previous hypermetabolic activity has resolved. No current active malignancy identified. 2. 2 mm right mid kidney nonobstructive renal calculus. 3.  Aortic Atherosclerosis (ICD10-I70.0).   09/23/2017 Imaging   CT A/P: No evidence of bowel obstruction.  Normal appendix. No CT findings to account for the patient's worsening right lower quadrant abdominal pain. No evidence of recurrent or metastatic disease.   10/31/2017 Imaging   PET: No evidence local ovarian carcinoma recurrence in the pelvis. No metastatic adenopathy or distant metastatic disease.   05/15/2018 Imaging   PET: 1. No evidence recurrent or  metastatic disease. 2.  Aortic atherosclerosis (ICD10-170.0).   09/19/2019 Imaging   CT A/P: Mild increase in size of several left upper quadrant peritoneal nodules measuring up to 11 mm, highly suspicious for increased peritoneal carcinomatosis. No other sites of metastatic disease identified. No evidence of ascites.   11/27/2019 Genetic Testing   CHEK2 p.N405K VUS found on the cancerNext HRD testing.  The CancerNext gene panel offered by [employer] includes sequencing and rearrangement analysis for the following 34 genes:   APC, ATM, BARD1, BMPR1A, BRCA1, BRCA2, BRIP1, CDH1, CDK4, CDKN2A, CHEK2, DICER1, HOXB13, EPCAM, GREM1, MLH1, MRE11A, MSH2, MSH6, MUTYH, NBN, NF1, PALB2, PMS2, POLD1, POLE, PTEN, RAD50, RAD51C, RAD51D, SMAD4, SMARCA4, STK11, and TP53.  The report date is November 27, 2019.  HRD testing did not identify any pathogenic variants in the tumor.  Therefore HRD is neg.   09/09/2021 -  Chemotherapy   Patient is on Treatment Plan : OVARIAN Paclitaxel / Carboplatin q7d       Interval History: Doing well.  Given continued slow progression on CT scan and continued increase of her CA-125, the patient ultimately opted to start treatment for recurrence after Thanksgiving.  She is mid cycle 1 of weekly carboplatin and Taxol.  She notes overall doing very well.  Her appetite has been very good.  She had some mild nausea.  Has some intermittent lower abdominal pain that resolves with Pepto-Bismol.  Also has pain when she needs to defecate, this resolves as soon as she has a bowel movement.  Notes some very minimal bleeding from her nose which she thinks is due to wiping it frequently.  Denies any heavy nosebleeds.  Past Medical/Surgical History: Past Medical History:  Diagnosis Date   Anxiety and depression     DJD (degenerative joint disease)    Elevated cholesterol    Endometrial polyp    Family history of bladder cancer    Family history of lymphoma    History of radiation  therapy 10/25/16-12/01/16   right pelvis/60.2 Gy in 28 fractions   IRRITABLE BOWEL SYNDROME, HX OF 05/15/2008   Kidney stone    MVP (mitral valve prolapse)    occ palpitations    Ovarian ca ([name]) 07/2012       Rheumatoid arthritis(714.0) ~ 2010   dr Ouida Sills   Shingles     Past Surgical History:  Procedure Laterality Date   ABDOMINAL HYSTERECTOMY  08/22/2012   Procedure: HYSTERECTOMY ABDOMINAL;  Surgeon: Alvino Chapel, MD;  Location: [facility];  Service: Gynecology;  Laterality: N/A;   BACK SURGERY  10/04/2006   Dr Trenton Gammon   COLONOSCOPY  04/20/2004   COLONOSCOPY W/ BIOPSIES  07/04/2014   COLOSTOMY REVISION  08/22/2012   Procedure: COLON RESECTION SIGMOID;  Surgeon: Alvino Chapel, MD;  Location: [facility];  Service: Gynecology;;  Rectal Sigmoid resection and low rectal anastomosis   CYST ON NECK  10/04/2009   DEBULKING  08/22/2012   Procedure: DEBULKING;  Surgeon: Alvino Chapel, MD;  Location: [facility];  Service: Gynecology;  Laterality: N/A;  Radical tumor debulking, Bilateral Ureterolysis   DILATION AND CURETTAGE OF UTERUS  10/04/2002   WITH HYSTEROSCOPY   HAND SURGERY  10/04/1994   HYSTEROSCOPY  10/04/2002   D&C   IR IMAGING GUIDED PORT INSERTION  09/01/2021   KIDNEY STONE SURGERY Right    with stent placement  Mar 03 2021   NECK SURGERY  10/05/2007   SPURS   OMENTECTOMY  08/22/2012   Procedure: OMENTECTOMY;  Surgeon: Alvino Chapel, MD;  Location: [facility];  Service: Gynecology;  Laterality: N/A;   SALPINGOOPHORECTOMY  08/22/2012   Procedure: SALPINGO OOPHORECTOMY;  Surgeon: Alvino Chapel, MD;  Location: [facility];  Service: Gynecology;  Laterality: Bilateral;   TUBAL LIGATION  10/04/1978    Family History  Problem Relation Age of Onset   Heart disease Mother    Bladder Cancer Father 58   Lymphoma Paternal Aunt    Osteoporosis Sister    Colon cancer Neg Hx    Rectal cancer Neg Hx    Stomach cancer Neg Hx    Diabetes Neg Hx     Stroke Neg Hx     Social History   Socioeconomic History   Marital status: Married    Spouse name: Not on file   Number of children: 2   Years of education: Not on file   Highest education level: Not on file  Occupational History   Occupation: disability d/t RA  Tobacco Use   Smoking status: Never   Smokeless tobacco: Never  Vaping Use   Vaping Use: Never used  Substance and Sexual Activity   Alcohol use: No   Drug use: No   Sexual activity: Never    Birth control/protection: Post-menopausal, Surgical  Other Topics Concern   Not on file  Social History Narrative   [name] is her daughter    Household-- pr and husband   Social Determinants of Radio broadcast assistant Strain: Not on file  Food Insecurity: Not on file  Transportation Needs: Not on file  Physical Activity: Not on file  Stress: Not on file  Social Connections: Not on file    Current Medications:  Current Outpatient Medications:    fish oil-omega-3 fatty acids 1000 MG capsule, Take 1 g by mouth daily., Disp: , Rfl:    fluticasone (FLONASE) 50 MCG/ACT nasal spray, Place 2 sprays into both nostrils daily., Disp: 16 g, Rfl: 1   lidocaine-prilocaine (EMLA) cream, Apply 1 application topically as directed. Apply 1/2 tablespoon to port site and cover with Press-and-Seal 2 hours prior to IV stick to numb site. Begin use 14 days after port is inserted. (Patient not taking: Reported on 09/08/2021), Disp: 30 g, Rfl: 1   Multiple Vitamin (MULTIVITAMIN WITH MINERALS) TABS, Take 1 tablet by mouth daily., Disp: , Rfl:    ondansetron (ZOFRAN) 8 MG tablet, Take 1 tablet (8 mg total) by mouth every 8 (eight) hours as needed for nausea or vomiting. (Patient not taking: Reported on 09/08/2021), Disp: 30 tablet, Rfl: 1   polyethylene glycol (MIRALAX) 17 g packet, Take 17 g by mouth daily as needed., Disp: 14 each, Rfl: 2   prochlorperazine (COMPAZINE) 10 MG tablet, Take 1 tablet (10 mg total) by mouth every 6 (six)  hours as needed for nausea. (Patient not taking: Reported on 09/08/2021), Disp: 60 tablet, Rfl: 1   Red Yeast Rice Extract 600 MG CAPS, Take 1 capsule by mouth daily. (Patient not taking: Reported on 09/08/2021), Disp: , Rfl:   Review of Systems: Pertinent positives as per HPI. Denies appetite changes, fevers, chills, fatigue, unexplained weight changes. Denies hearing loss, neck lumps or masses, mouth sores, ringing in ears or voice changes. Denies cough or wheezing.  Denies shortness of breath. Denies chest pain or palpitations. Denies leg swelling. Denies abdominal distention, blood in stools, constipation, diarrhea, nausea, vomiting, or early satiety. Denies pain with intercourse, dysuria, frequency, hematuria or incontinence. Denies hot flashes, pelvic pain, vaginal bleeding or vaginal discharge.   Denies  joint pain, back pain or muscle pain/cramps. Denies itching, rash, or wounds. Denies dizziness, headaches, numbness or seizures. Denies swollen lymph nodes or glands, denies easy bruising or bleeding. Denies anxiety, depression, confusion, or decreased concentration.  Physical Exam: BP 121/85 (Patient Position: Sitting)    Pulse 85    Temp (!) 97 F (36.1 C) (Tympanic)    Resp 16    Ht '5\' 2"'  (1.575 m)    Wt 147 lb (66.7 kg)    SpO2 99%    BMI 26.89 kg/m  General: Alert, oriented, no acute distress. HEENT: Normocephalic, atraumatic, sclera anicteric. Chest: Clear to auscultation bilaterally.  No wheezes or rhonchi.  Port site clean. Cardiovascular: Regular rate and rhythm, no murmurs. Abdomen: soft, nontender.  Normoactive bowel sounds.  No masses or hepatosplenomegaly appreciated.  Well-healed scar. Extremities: Grossly normal range of motion.  Warm, well perfused.  No edema bilaterally. Skin: No rashes or lesions noted. Lymphatics: No cervical, supraclavicular, or inguinal adenopathy. GU: Normal appearing external genitalia without erythema, excoriation, or lesions.  Speculum exam  reveals significant posterior prolapse, vagina mildly atrophic, no lesions or masses.  Bimanual exam reveals cuff intact, vaginal mucosa smooth, no nodularity or masses.  Rectovaginal exam confirms these findings.  Laboratory & Radiologic Studies: Component Ref Range & Units 1 mo ago  (08/14/21) 4 mo ago  (05/11/21) 6 mo ago  (03/17/21) 8 mo ago  (01/13/21) 1 yr ago  (09/11/20) 1 yr ago  (06/27/20) 1 yr ago  (03/13/20)  Cancer Antigen (CA) 125 0.0 - 38.1 U/mL 48.3 High   35.5 CM  36.6 CM  34.3 CM  17.5 CM  9.3 CM  7.6 CM     Assessment & Plan: AANIKA [name] is a 70 y.o. woman with history of HGS ovarian cancer, treated initially in 2014 with optimal cytoreduction and adjuvant platinum-based chemotherapy, treated for localized right pelvic LN recurrence in 2018 with RT, with slowly increasing CEA-125 and mild progression of indolent disease, who was now started treatment for recurrent disease.  Patient is doing well now on treatment again for recurrent disease.  She is following with Dr. Benay Spice.   She continues to basically be asymptomatic with regards to her cancer.  We discussed her rectocele again today. She continues to be asymptomatic and thus does not need to evaluation or treatment.   I have asked her to call me for scheduling of follow-up when she finishes treatment.  28 minutes of total time was spent for this patient encounter, including preparation, face-to-face counseling with the patient and coordination of care, and documentation of the encounter.  Jeral Pinch, MD  Division of Gynecologic Oncology  Department of Obstetrics and Gynecology  [facility]

## 2021-10-04 ENCOUNTER — Encounter: Payer: Self-pay | Admitting: Oncology

## 2021-10-05 ENCOUNTER — Encounter: Payer: Self-pay | Admitting: Oncology

## 2021-10-05 ENCOUNTER — Other Ambulatory Visit: Payer: Self-pay | Admitting: Oncology

## 2021-10-06 ENCOUNTER — Inpatient Hospital Stay: Payer: Medicare Other | Attending: Oncology

## 2021-10-06 ENCOUNTER — Other Ambulatory Visit: Payer: Self-pay

## 2021-10-06 ENCOUNTER — Inpatient Hospital Stay: Payer: Medicare Other

## 2021-10-06 ENCOUNTER — Inpatient Hospital Stay: Payer: Medicare Other | Admitting: Oncology

## 2021-10-06 VITALS — BP 133/76 | HR 92 | Temp 98.1°F | Resp 20 | Ht 62.0 in | Wt 150.6 lb

## 2021-10-06 DIAGNOSIS — C569 Malignant neoplasm of unspecified ovary: Secondary | ICD-10-CM | POA: Insufficient documentation

## 2021-10-06 DIAGNOSIS — D701 Agranulocytosis secondary to cancer chemotherapy: Secondary | ICD-10-CM | POA: Insufficient documentation

## 2021-10-06 DIAGNOSIS — Z5111 Encounter for antineoplastic chemotherapy: Secondary | ICD-10-CM | POA: Insufficient documentation

## 2021-10-06 LAB — CMP (CANCER CENTER ONLY)
ALT: 23 U/L (ref 0–44)
AST: 17 U/L (ref 15–41)
Albumin: 4 g/dL (ref 3.5–5.0)
Alkaline Phosphatase: 50 U/L (ref 38–126)
Anion gap: 9 (ref 5–15)
BUN: 19 mg/dL (ref 8–23)
CO2: 27 mmol/L (ref 22–32)
Calcium: 9.1 mg/dL (ref 8.9–10.3)
Chloride: 104 mmol/L (ref 98–111)
Creatinine: 0.64 mg/dL (ref 0.44–1.00)
GFR, Estimated: >60 mL/min (ref >60)
Glucose, Bld: 150 mg/dL — ABNORMAL HIGH (ref 70–99)
Potassium: 3.9 mmol/L (ref 3.5–5.1)
Sodium: 140 mmol/L (ref 135–145)
Total Bilirubin: 0.5 mg/dL (ref 0.3–1.2)
Total Protein: 6.7 g/dL (ref 6.5–8.1)

## 2021-10-06 LAB — CBC WITH DIFFERENTIAL (CANCER CENTER ONLY)
Abs Immature Granulocytes: 0.02 10*3/uL (ref 0.00–0.07)
Basophils Absolute: 0.1 10*3/uL (ref 0.0–0.1)
Basophils Relative: 1 %
Eosinophils Absolute: 0 10*3/uL (ref 0.0–0.5)
Eosinophils Relative: 1 %
HCT: 39.8 % (ref 36.0–46.0)
Hemoglobin: 12.8 g/dL (ref 12.0–15.0)
Immature Granulocytes: 1 %
Lymphocytes Relative: 15 %
Lymphs Abs: 0.6 10*3/uL — ABNORMAL LOW (ref 0.7–4.0)
MCH: 32.3 pg (ref 26.0–34.0)
MCHC: 32.2 g/dL (ref 30.0–36.0)
MCV: 100.5 fL — ABNORMAL HIGH (ref 80.0–100.0)
Monocytes Absolute: 0.6 10*3/uL (ref 0.1–1.0)
Monocytes Relative: 15 %
Neutro Abs: 3 10*3/uL (ref 1.7–7.7)
Neutrophils Relative %: 67 %
Platelet Count: 219 10*3/uL (ref 150–400)
RBC: 3.96 MIL/uL (ref 3.87–5.11)
RDW: 14.3 % (ref 11.5–15.5)
WBC Count: 4.4 10*3/uL (ref 4.0–10.5)
nRBC: 0 % (ref 0.0–0.2)

## 2021-10-06 NOTE — Progress Notes (Signed)
[place] OFFICE PROGRESS NOTE   Diagnosis: Ovarian cancer  INTERVAL HISTORY:   [name] [name] was last treated with Taxol/carboplatin on 09/22/2021.  Known new neuropathy symptoms.  She feels well.  Intermittent lower abdominal discomfort is relieved with Pepto-Bismol.  She had a recent mild nosebleed.  No other bleeding.  She has developed alopecia.  Objective:  Vital signs in last 24 hours:  Blood pressure 133/76, pulse 92, temperature 98.1 F (36.7 C), temperature source Oral, resp. rate 20, height _0  (1.575 m), weight 150 lb 9.6 oz (68.3 kg), SpO2 100 %.    HEENT: No thrush or ulcers, nares without blood Resp: Lungs clear bilaterally Cardio: Regular rate and rhythm GI: No mass, nontender, no apparent ascites Vascular: No leg edema   Portacath/PICC-without erythema  Lab Results:  Lab Results  Component Value Date   WBC 4.4 10/06/2021   HGB 12.8 10/06/2021   HCT 39.8 10/06/2021   MCV 100.5 (H) 10/06/2021   PLT 219 10/06/2021   NEUTROABS 3.0 10/06/2021    CMP  Lab Results  Component Value Date   NA 140 10/06/2021   K 3.9 10/06/2021   CL 104 10/06/2021   CO2 27 10/06/2021   GLUCOSE 150 (H) 10/06/2021   BUN 19 10/06/2021   CREATININE 0.64 10/06/2021   CALCIUM 9.1 10/06/2021   PROT 6.7 10/06/2021   ALBUMIN 4.0 10/06/2021   AST 17 10/06/2021   ALT 23 10/06/2021   ALKPHOS 50 10/06/2021   BILITOT 0.5 10/06/2021   GFRNONAA >60 10/06/2021   GFRAA >60 03/13/2020    Medications: I have reviewed the patient's current medications.   Assessment/Plan:  1. 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 A 5 mm nodules remain on the right diaphragm. - TumorNext paired germline/tumor analyses: No somatic variants detected, germline CHEK2      VUS      Cycle 1 of adjuvant Taxol/carboplatin chemotherapy  initiated on 09/19/2012.   The CA 125 normalized.   She completed day 15 of cycle 6 on 02/06/2013.   Restaging CT evaluation 03/29/2013 showed no evidence of metastatic disease in the chest. There was marked improvement in appearance/resolution of previous described peritoneal/omental disease. There was no convincing evidence of residual disease. There was minimal increased density in the region of the omentum favored to be treatment-related. There was no pelvic adenopathy. CA125 3.2 on 02/19/2014. 08/18/2016 CA-125 8 CT abdomen/pelvis 08/25/2016-solitary new enlarged right external iliac lymph node measuring 2.2 cm. Biopsy 09/07/2016-adenocarcinoma consistent with high-grade serous carcinoma. PET scan 09/22/2016-malignant range FDG uptake associated with the enlarged right external iliac lymph node compatible with metastatic adenopathy.  No additional sites of metastatic disease identified. Radiation 10/25/2016-12/01/2016 PET scan 03/28/2017-previously enlarged hypermetabolic right external iliac node-normal in size with resolution of hypermetabolic activity, no active malignancy identified PET scan 05/15/2018-no evidence of recurrent or metastatic disease, no hypermetabolic lymph nodes CT abdomen/pelvis 09/19/2019-mild increase in the size of several left upper quadrant peritoneal nodules measuring up to 11 mm.  No other sites of metastatic disease identified.  No ascites. CT abdomen/pelvis 03/14/2020-slight enlargement of several left upper quadrant peritoneal nodules, no ascites, no other evidence of disease progression CT abdomen/pelvis 09/12/2020 peritoneal nodularity/omental caking predominantly in the left upper/mid abdomen, mildly progressive.  Largest implant in the left upper abdomen adjacent to the stomach now measures 17 mm, previously 13 mm.  Overall volume of peritoneal disease has mildly progressed.  CT abdomen/pelvis 01/13/2021- 4 mm right ureteral calculus with moderate right hydronephrosis  and upper right hydroureter, progressive omental nodularity, stable calcified and partially calcified right pelvic nodules CT abdomen/pelvis 05/13/2021-enlargement of left upper quadrant omental nodules and a peritoneal nodule at the right iliac fossa, no ascites CT abdomen/pelvis 08/14/2021-mild increase in size of omental nodules, no ascites, no new site of metastatic disease Taxol/carboplatin 09/09/2021, 09/15/2021, 09/22/2021 Taxol/carboplatin 10/06/2021  2. Low abdomen/suprapubic pain prior to the exploratory laparotomy-likely secondary to omental/pelvic tumor; persistent mild pain in the lower abdomen   3. Chronic neck and back pain.   4. Anxiety -persistent despite Lexapro and Xanax. She has been evaluated by psychiatry. Currently on Lexapro. 5. Status post Port-A-Cath placement 09/22/2012. The Port-A-Cath was removed on 04/03/2013.   6. Neutropenia secondary to chemotherapy- day 15 cycle 1 and cycle 3. Taxol/carboplatin not given secondary to neutropenia.    7. Herpes zoster involving a right thoracic dermatome July 2015. She completed a course of Valtrex. 8. Nodular bony prominence at the left pelvis on rectal exam 06/05/2015-likely a benign finding 9.  Right ureter stone/hydroureteronephrosis on CT 01/13/2021-referred to urology; status post right ureteroscopy with stone extraction and ureteral stent placement.  Stent removal 03/11/2021     Disposition: [name] [name] appears stable.  She has tolerated the weekly Taxol/carboplatin well.  She will complete another 3-week cycle beginning today.  She will return for an office and lab visit on 11/03/2021.  She will be scheduled for a restaging CT evaluation after the third cycle of Taxol/carboplatin.  We will follow-up on the CA125 from today.  [name], MD  10/06/2021  9:43 AM

## 2021-10-07 ENCOUNTER — Inpatient Hospital Stay: Payer: Medicare Other

## 2021-10-07 ENCOUNTER — Other Ambulatory Visit: Payer: Self-pay

## 2021-10-07 VITALS — BP 118/78 | HR 70 | Temp 98.0°F | Resp 18

## 2021-10-07 DIAGNOSIS — C569 Malignant neoplasm of unspecified ovary: Secondary | ICD-10-CM

## 2021-10-07 DIAGNOSIS — Z5111 Encounter for antineoplastic chemotherapy: Secondary | ICD-10-CM | POA: Diagnosis not present

## 2021-10-07 DIAGNOSIS — D701 Agranulocytosis secondary to cancer chemotherapy: Secondary | ICD-10-CM | POA: Diagnosis not present

## 2021-10-07 MED ORDER — SODIUM CHLORIDE 0.9 % IV SOLN
159.4000 mg | Freq: Once | INTRAVENOUS | Status: AC
  Administered 2021-10-07: 160 mg via INTRAVENOUS
  Filled 2021-10-07: qty 16

## 2021-10-07 MED ORDER — FAMOTIDINE 20 MG IN NS 100 ML IVPB
20.0000 mg | Freq: Once | INTRAVENOUS | Status: AC
  Administered 2021-10-07: 20 mg via INTRAVENOUS
  Filled 2021-10-07: qty 100

## 2021-10-07 MED ORDER — DIPHENHYDRAMINE HCL 50 MG/ML IJ SOLN
25.0000 mg | Freq: Once | INTRAMUSCULAR | Status: AC
  Administered 2021-10-07: 25 mg via INTRAVENOUS
  Filled 2021-10-07: qty 1

## 2021-10-07 MED ORDER — HEPARIN SOD (PORK) LOCK FLUSH 100 UNIT/ML IV SOLN
500.0000 [IU] | Freq: Once | INTRAVENOUS | Status: AC | PRN
  Administered 2021-10-07: 500 [IU]

## 2021-10-07 MED ORDER — PALONOSETRON HCL INJECTION 0.25 MG/5ML
0.2500 mg | Freq: Once | INTRAVENOUS | Status: AC
  Administered 2021-10-07: 0.25 mg via INTRAVENOUS
  Filled 2021-10-07: qty 5

## 2021-10-07 MED ORDER — SODIUM CHLORIDE 0.9 % IV SOLN
10.0000 mg | Freq: Once | INTRAVENOUS | Status: AC
  Administered 2021-10-07: 10 mg via INTRAVENOUS
  Filled 2021-10-07: qty 1

## 2021-10-07 MED ORDER — SODIUM CHLORIDE 0.9 % IV SOLN: Freq: Once | INTRAVENOUS | Status: AC

## 2021-10-07 MED ORDER — SODIUM CHLORIDE 0.9 % IV SOLN
80.0000 mg/m2 | Freq: Once | INTRAVENOUS | Status: AC
  Administered 2021-10-07: 138 mg via INTRAVENOUS
  Filled 2021-10-07: qty 23

## 2021-10-07 MED ORDER — SODIUM CHLORIDE 0.9% FLUSH
10.0000 mL | INTRAVENOUS | Status: DC | PRN
  Administered 2021-10-07: 10 mL

## 2021-10-07 NOTE — Progress Notes (Signed)
Patient presents for treatment. RN assessment completed along with the following:  Labs/vitals reviewed - Yes, and within treatment parameters.   Weight within 10% of previous measurement - Yes Oncology Treatment Attestation completed for current therapy- Yes, on date 08/17/2021 Informed consent completed and reflects current therapy/intent - Yes, on date 09/09/2021             Provider progress note reviewed - Patient not seen by provider today. Most recent note dated 10/06/2020 reviewed. Treatment/Antibody/Supportive plan reviewed - Yes, and there are no adjustments needed for today's treatment. S&H and other orders reviewed - Yes, and there are no additional orders identified. Previous treatment date reviewed - Yes, and the appropriate amount of time has elapsed between treatments. Clinic Hand Off Received from - No.  Patient to proceed with treatment.

## 2021-10-07 NOTE — Patient Instructions (Signed)
[name] [name]   Discharge Instructions: Thank you for choosing [facility] to provide your oncology and hematology care.   If you have a lab appointment with the [facility], please go directly to the [facility] and check in at the registration area.   Wear comfortable clothing and clothing appropriate for easy access to any Portacath or PICC line.   We strive to give you quality time with your provider. You may need to reschedule your appointment if you arrive late (15 or more minutes).  Arriving late affects you and other patients whose appointments are after yours.  Also, if you miss three or more appointments without notifying the office, you may be dismissed from the clinic at the providers discretion.      For prescription refill requests, have your pharmacy contact our office and allow 72 hours for refills to be completed.    Today you received the following chemotherapy and/or immunotherapy agents Paclitaxel (TAXOL) & Carboplatin (PARAPLATIN).      To help prevent nausea and vomiting after your treatment, we encourage you to take your nausea medication as directed.  BELOW ARE SYMPTOMS THAT SHOULD BE REPORTED IMMEDIATELY: *FEVER GREATER THAN 100.4 F (38 C) OR HIGHER *CHILLS OR SWEATING *NAUSEA AND VOMITING THAT IS NOT CONTROLLED WITH YOUR NAUSEA MEDICATION *UNUSUAL SHORTNESS OF BREATH *UNUSUAL BRUISING OR BLEEDING *URINARY PROBLEMS (pain or burning when urinating, or frequent urination) *BOWEL PROBLEMS (unusual diarrhea, constipation, pain near the anus) TENDERNESS IN MOUTH AND THROAT WITH OR WITHOUT PRESENCE OF ULCERS (sore throat, sores in mouth, or a toothache) UNUSUAL RASH, SWELLING OR PAIN  UNUSUAL VAGINAL DISCHARGE OR ITCHING   Items with * indicate a potential emergency and should be followed up as soon as possible or go to the Emergency Department if any problems should occur.  Please show the CHEMOTHERAPY ALERT CARD or  IMMUNOTHERAPY ALERT CARD at check-in to the Emergency Department and triage nurse.  Should you have questions after your visit or need to cancel or reschedule your appointment, please contact [facility]  Dept: [phone]  and follow the prompts.  Office hours are 8:00 a.m. to 4:30 p.m. Monday - Friday. Please note that voicemails left after 4:00 p.m. may not be returned until the following business day.  We are closed weekends and major holidays. You have access to a nurse at all times for urgent questions. Please call the main number to the clinic Dept: [phone] and follow the prompts.   For any non-urgent questions, you may also contact your provider using MyChart. We now offer e-Visits for anyone 50 and older to request care online for non-urgent symptoms. For details visit [email or web address].   Also download the MyChart app! Go to the app store, search "MyChart", open the app, select [facility], and log in with your MyChart username and password.  Due to Covid, a mask is required upon entering the hospital/clinic. If you do not have a mask, one will be given to you upon arrival. For doctor visits, patients may have 1 support person aged 34 or older with them. For treatment visits, patients cannot have anyone with them due to current Covid guidelines and our immunocompromised population.   Paclitaxel injection What is this medication? PACLITAXEL (PAK li TAX el) is a chemotherapy drug. It targets fast dividing cells, like cancer cells, and causes these cells to die. This medicine is used to treat ovarian cancer, breast cancer, lung cancer, Kaposi's sarcoma, and  other cancers. This medicine may be used for other purposes; ask your health care provider or pharmacist if you have questions. COMMON BRAND NAME(S): Onxol, Taxol What should I tell my care team before I take this medication? They need to know if you have any of these conditions: history of irregular  heartbeat liver disease low blood counts, like low white cell, platelet, or red cell counts lung or breathing disease, like asthma tingling of the fingers or toes, or other nerve disorder an unusual or allergic reaction to paclitaxel, alcohol, polyoxyethylated castor oil, other chemotherapy, other medicines, foods, dyes, or preservatives pregnant or trying to get pregnant breast-feeding How should I use this medication? This drug is given as an infusion into a vein. It is administered in a hospital or clinic by a specially trained health care professional. Talk to your pediatrician regarding the use of this medicine in children. Special care may be needed. Overdosage: If you think you have taken too much of this medicine contact a poison control center or emergency room at once. NOTE: This medicine is only for you. Do not share this medicine with others. What if I miss a dose? It is important not to miss your dose. Call your doctor or health care professional if you are unable to keep an appointment. What may interact with this medication? Do not take this medicine with any of the following medications: live virus vaccines This medicine may also interact with the following medications: antiviral medicines for hepatitis, HIV or AIDS certain antibiotics like erythromycin and clarithromycin certain medicines for fungal infections like ketoconazole and itraconazole certain medicines for seizures like carbamazepine, phenobarbital, phenytoin gemfibrozil nefazodone rifampin St. John's wort This list may not describe all possible interactions. Give your health care provider a list of all the medicines, herbs, non-prescription drugs, or dietary supplements you use. Also tell them if you smoke, drink alcohol, or use illegal drugs. Some items may interact with your medicine. What should I watch for while using this medication? Your condition will be monitored carefully while you are receiving this  medicine. You will need important blood work done while you are taking this medicine. This medicine can cause serious allergic reactions. To reduce your risk you will need to take other medicine(s) before treatment with this medicine. If you experience allergic reactions like skin rash, itching or hives, swelling of the face, lips, or tongue, tell your doctor or health care professional right away. In some cases, you may be given additional medicines to help with side effects. Follow all directions for their use. This drug may make you feel generally unwell. This is not uncommon, as chemotherapy can affect healthy cells as well as cancer cells. Report any side effects. Continue your course of treatment even though you feel ill unless your doctor tells you to stop. Call your doctor or health care professional for advice if you get a fever, chills or sore throat, or other symptoms of a cold or flu. Do not treat yourself. This drug decreases your body's ability to fight infections. Try to avoid being around people who are sick. This medicine may increase your risk to bruise or bleed. Call your doctor or health care professional if you notice any unusual bleeding. Be careful brushing and flossing your teeth or using a toothpick because you may get an infection or bleed more easily. If you have any dental work done, tell your dentist you are receiving this medicine. Avoid taking products that contain aspirin, acetaminophen, ibuprofen, naproxen, or  ketoprofen unless instructed by your doctor. These medicines may hide a fever. Do not become pregnant while taking this medicine. Women should inform their doctor if they wish to become pregnant or think they might be pregnant. There is a potential for serious side effects to an unborn child. Talk to your health care professional or pharmacist for more information. Do not breast-feed an infant while taking this medicine. Men are advised not to father a child while  receiving this medicine. This product may contain alcohol. Ask your pharmacist or healthcare provider if this medicine contains alcohol. Be sure to tell all healthcare providers you are taking this medicine. Certain medicines, like metronidazole and disulfiram, can cause an unpleasant reaction when taken with alcohol. The reaction includes flushing, headache, nausea, vomiting, sweating, and increased thirst. The reaction can last from 30 minutes to several hours. What side effects may I notice from receiving this medication? Side effects that you should report to your doctor or health care professional as soon as possible: allergic reactions like skin rash, itching or hives, swelling of the face, lips, or tongue breathing problems changes in vision fast, irregular heartbeat high or low blood pressure mouth sores pain, tingling, numbness in the hands or feet signs of decreased platelets or bleeding - bruising, pinpoint red spots on the skin, black, tarry stools, blood in the urine signs of decreased red blood cells - unusually weak or tired, feeling faint or lightheaded, falls signs of infection - fever or chills, cough, sore throat, pain or difficulty passing urine signs and symptoms of liver injury like dark yellow or brown urine; general ill feeling or flu-like symptoms; light-colored stools; loss of appetite; nausea; right upper belly pain; unusually weak or tired; yellowing of the eyes or skin swelling of the ankles, feet, hands unusually slow heartbeat Side effects that usually do not require medical attention (report to your doctor or health care professional if they continue or are bothersome): diarrhea hair loss loss of appetite muscle or joint pain nausea, vomiting pain, redness, or irritation at site where injected tiredness This list may not describe all possible side effects. Call your doctor for medical advice about side effects. You may report side effects to FDA at  1-800-FDA-1088. Where should I keep my medication? This drug is given in a hospital or clinic and will not be stored at home. NOTE: This sheet is a summary. It may not cover all possible information. If you have questions about this medicine, talk to your doctor, pharmacist, or health care provider.  2022 Elsevier/Gold Standard (2021-06-09 00:00:00)  Carboplatin injection What is this medication? CARBOPLATIN (KAR boe pla tin) is a chemotherapy drug. It targets fast dividing cells, like cancer cells, and causes these cells to die. This medicine is used to treat ovarian cancer and many other cancers. This medicine may be used for other purposes; ask your health care provider or pharmacist if you have questions. COMMON BRAND NAME(S): Paraplatin What should I tell my care team before I take this medication? They need to know if you have any of these conditions: blood disorders hearing problems kidney disease recent or ongoing radiation therapy an unusual or allergic reaction to carboplatin, cisplatin, other chemotherapy, other medicines, foods, dyes, or preservatives pregnant or trying to get pregnant breast-feeding How should I use this medication? This drug is usually given as an infusion into a vein. It is administered in a hospital or clinic by a specially trained health care professional. Talk to your pediatrician regarding the use  of this medicine in children. Special care may be needed. Overdosage: If you think you have taken too much of this medicine contact a poison control center or emergency room at once. NOTE: This medicine is only for you. Do not share this medicine with others. What if I miss a dose? It is important not to miss a dose. Call your doctor or health care professional if you are unable to keep an appointment. What may interact with this medication? medicines for seizures medicines to increase blood counts like filgrastim, pegfilgrastim, sargramostim some  antibiotics like amikacin, gentamicin, neomycin, streptomycin, tobramycin vaccines Talk to your doctor or health care professional before taking any of these medicines: acetaminophen aspirin ibuprofen ketoprofen naproxen This list may not describe all possible interactions. Give your health care provider a list of all the medicines, herbs, non-prescription drugs, or dietary supplements you use. Also tell them if you smoke, drink alcohol, or use illegal drugs. Some items may interact with your medicine. What should I watch for while using this medication? Your condition will be monitored carefully while you are receiving this medicine. You will need important blood work done while you are taking this medicine. This drug may make you feel generally unwell. This is not uncommon, as chemotherapy can affect healthy cells as well as cancer cells. Report any side effects. Continue your course of treatment even though you feel ill unless your doctor tells you to stop. In some cases, you may be given additional medicines to help with side effects. Follow all directions for their use. Call your doctor or health care professional for advice if you get a fever, chills or sore throat, or other symptoms of a cold or flu. Do not treat yourself. This drug decreases your body's ability to fight infections. Try to avoid being around people who are sick. This medicine may increase your risk to bruise or bleed. Call your doctor or health care professional if you notice any unusual bleeding. Be careful brushing and flossing your teeth or using a toothpick because you may get an infection or bleed more easily. If you have any dental work done, tell your dentist you are receiving this medicine. Avoid taking products that contain aspirin, acetaminophen, ibuprofen, naproxen, or ketoprofen unless instructed by your doctor. These medicines may hide a fever. Do not become pregnant while taking this medicine. Women should  inform their doctor if they wish to become pregnant or think they might be pregnant. There is a potential for serious side effects to an unborn child. Talk to your health care professional or pharmacist for more information. Do not breast-feed an infant while taking this medicine. What side effects may I notice from receiving this medication? Side effects that you should report to your doctor or health care professional as soon as possible: allergic reactions like skin rash, itching or hives, swelling of the face, lips, or tongue signs of infection - fever or chills, cough, sore throat, pain or difficulty passing urine signs of decreased platelets or bleeding - bruising, pinpoint red spots on the skin, black, tarry stools, nosebleeds signs of decreased red blood cells - unusually weak or tired, fainting spells, lightheadedness breathing problems changes in hearing changes in vision chest pain high blood pressure low blood counts - This drug may decrease the number of white blood cells, red blood cells and platelets. You may be at increased risk for infections and bleeding. nausea and vomiting pain, swelling, redness or irritation at the injection site pain, tingling, numbness in  the hands or feet problems with balance, talking, walking trouble passing urine or change in the amount of urine Side effects that usually do not require medical attention (report to your doctor or health care professional if they continue or are bothersome): hair loss loss of appetite metallic taste in the mouth or changes in taste This list may not describe all possible side effects. Call your doctor for medical advice about side effects. You may report side effects to FDA at 1-800-FDA-1088. Where should I keep my medication? This drug is given in a hospital or clinic and will not be stored at home. NOTE: This sheet is a summary. It may not cover all possible information. If you have questions about this medicine,  talk to your doctor, pharmacist, or health care provider.  2022 Elsevier/Gold Standard (2008-02-28 00:00:00)

## 2021-10-08 DIAGNOSIS — H524 Presbyopia: Secondary | ICD-10-CM | POA: Diagnosis not present

## 2021-10-10 ENCOUNTER — Other Ambulatory Visit: Payer: Self-pay | Admitting: Oncology

## 2021-10-13 ENCOUNTER — Other Ambulatory Visit: Payer: Self-pay

## 2021-10-13 ENCOUNTER — Inpatient Hospital Stay: Payer: Medicare Other

## 2021-10-13 ENCOUNTER — Ambulatory Visit: Payer: Medicare Other

## 2021-10-13 ENCOUNTER — Other Ambulatory Visit: Payer: Medicare Other

## 2021-10-13 VITALS — BP 137/71 | HR 64 | Temp 98.6°F | Resp 18 | Ht 62.0 in | Wt 150.8 lb

## 2021-10-13 DIAGNOSIS — Z5111 Encounter for antineoplastic chemotherapy: Secondary | ICD-10-CM | POA: Diagnosis not present

## 2021-10-13 DIAGNOSIS — C569 Malignant neoplasm of unspecified ovary: Secondary | ICD-10-CM

## 2021-10-13 DIAGNOSIS — D701 Agranulocytosis secondary to cancer chemotherapy: Secondary | ICD-10-CM | POA: Diagnosis not present

## 2021-10-13 LAB — CBC WITH DIFFERENTIAL (CANCER CENTER ONLY)
Abs Immature Granulocytes: 0.04 10*3/uL (ref 0.00–0.07)
Basophils Absolute: 0 10*3/uL (ref 0.0–0.1)
Basophils Relative: 1 %
Eosinophils Absolute: 0.1 10*3/uL (ref 0.0–0.5)
Eosinophils Relative: 2 %
HCT: 38.3 % (ref 36.0–46.0)
Hemoglobin: 12.3 g/dL (ref 12.0–15.0)
Immature Granulocytes: 1 %
Lymphocytes Relative: 23 %
Lymphs Abs: 1 10*3/uL (ref 0.7–4.0)
MCH: 32.2 pg (ref 26.0–34.0)
MCHC: 32.1 g/dL (ref 30.0–36.0)
MCV: 100.3 fL — ABNORMAL HIGH (ref 80.0–100.0)
Monocytes Absolute: 0.4 10*3/uL (ref 0.1–1.0)
Monocytes Relative: 9 %
Neutro Abs: 2.7 10*3/uL (ref 1.7–7.7)
Neutrophils Relative %: 64 %
Platelet Count: 171 10*3/uL (ref 150–400)
RBC: 3.82 MIL/uL — ABNORMAL LOW (ref 3.87–5.11)
RDW: 14.2 % (ref 11.5–15.5)
WBC Count: 4.2 10*3/uL (ref 4.0–10.5)
nRBC: 0 % (ref 0.0–0.2)

## 2021-10-13 LAB — CMP (CANCER CENTER ONLY)
ALT: 22 U/L (ref 0–44)
AST: 16 U/L (ref 15–41)
Albumin: 4 g/dL (ref 3.5–5.0)
Alkaline Phosphatase: 42 U/L (ref 38–126)
Anion gap: 8 (ref 5–15)
BUN: 18 mg/dL (ref 8–23)
CO2: 27 mmol/L (ref 22–32)
Calcium: 9.3 mg/dL (ref 8.9–10.3)
Chloride: 104 mmol/L (ref 98–111)
Creatinine: 0.57 mg/dL (ref 0.44–1.00)
GFR, Estimated: >60 mL/min (ref >60)
Glucose, Bld: 81 mg/dL (ref 70–99)
Potassium: 4.2 mmol/L (ref 3.5–5.1)
Sodium: 139 mmol/L (ref 135–145)
Total Bilirubin: 0.4 mg/dL (ref 0.3–1.2)
Total Protein: 6.6 g/dL (ref 6.5–8.1)

## 2021-10-13 MED ORDER — SODIUM CHLORIDE 0.9% FLUSH
10.0000 mL | INTRAVENOUS | Status: DC | PRN
  Administered 2021-10-13: 10 mL

## 2021-10-13 MED ORDER — SODIUM CHLORIDE 0.9 % IV SOLN
80.0000 mg/m2 | Freq: Once | INTRAVENOUS | Status: AC
  Administered 2021-10-13: 138 mg via INTRAVENOUS
  Filled 2021-10-13: qty 23

## 2021-10-13 MED ORDER — SODIUM CHLORIDE 0.9 % IV SOLN
159.4000 mg | Freq: Once | INTRAVENOUS | Status: AC
  Administered 2021-10-13: 160 mg via INTRAVENOUS
  Filled 2021-10-13: qty 16

## 2021-10-13 MED ORDER — DIPHENHYDRAMINE HCL 50 MG/ML IJ SOLN
25.0000 mg | Freq: Once | INTRAMUSCULAR | Status: AC
  Administered 2021-10-13: 25 mg via INTRAVENOUS
  Filled 2021-10-13: qty 1

## 2021-10-13 MED ORDER — HEPARIN SOD (PORK) LOCK FLUSH 100 UNIT/ML IV SOLN
500.0000 [IU] | Freq: Once | INTRAVENOUS | Status: AC | PRN
  Administered 2021-10-13: 500 [IU]

## 2021-10-13 MED ORDER — SODIUM CHLORIDE 0.9 % IV SOLN
10.0000 mg | Freq: Once | INTRAVENOUS | Status: AC
  Administered 2021-10-13: 10 mg via INTRAVENOUS
  Filled 2021-10-13: qty 10

## 2021-10-13 MED ORDER — PALONOSETRON HCL INJECTION 0.25 MG/5ML
0.2500 mg | Freq: Once | INTRAVENOUS | Status: AC
  Administered 2021-10-13: 0.25 mg via INTRAVENOUS
  Filled 2021-10-13: qty 5

## 2021-10-13 MED ORDER — SODIUM CHLORIDE 0.9 % IV SOLN: Freq: Once | INTRAVENOUS | Status: AC

## 2021-10-13 MED ORDER — FAMOTIDINE 20 MG IN NS 100 ML IVPB
20.0000 mg | Freq: Once | INTRAVENOUS | Status: AC
  Administered 2021-10-13: 20 mg via INTRAVENOUS
  Filled 2021-10-13: qty 100

## 2021-10-13 NOTE — Progress Notes (Signed)
Patient presents for treatment. RN assessment completed along with the following:  Labs/vitals reviewed - Yes, and within treatment parameters.   Weight within 10% of previous measurement - Yes Oncology Treatment Attestation completed for current therapy- Yes, on date 08/17/21 Informed consent completed and reflects current therapy/intent - Yes, on date 09/09/21             Provider progress note reviewed - Patient not seen by provider today. Most recent note dated 10/06/21 reviewed. Treatment/Antibody/Supportive plan reviewed - Yes, and there are no adjustments needed for today's treatment. S&H and other orders reviewed - Yes, and there are no additional orders identified. Previous treatment date reviewed - Yes, and the appropriate amount of time has elapsed between treatments. Clinic Hand Off Received from - none   Patient to proceed with treatment.

## 2021-10-13 NOTE — Patient Instructions (Signed)
[place]   Discharge Instructions: Thank you for choosing [facility] to provide your oncology and hematology care.   If you have a lab appointment with the [facility], please go directly to the [facility] and check in at the registration area.   Wear comfortable clothing and clothing appropriate for easy access to any Portacath or PICC line.   We strive to give you quality time with your provider. You may need to reschedule your appointment if you arrive late (15 or more minutes).  Arriving late affects you and other patients whose appointments are after yours.  Also, if you miss three or more appointments without notifying the office, you may be dismissed from the clinic at the providers discretion.      For prescription refill requests, have your pharmacy contact our office and allow 72 hours for refills to be completed.    Today you received the following chemotherapy and/or immunotherapy agents Taxol and Carboplatin      To help prevent nausea and vomiting after your treatment, we encourage you to take your nausea medication as directed.  BELOW ARE SYMPTOMS THAT SHOULD BE REPORTED IMMEDIATELY: *FEVER GREATER THAN 100.4 F (38 C) OR HIGHER *CHILLS OR SWEATING *NAUSEA AND VOMITING THAT IS NOT CONTROLLED WITH YOUR NAUSEA MEDICATION *UNUSUAL SHORTNESS OF BREATH *UNUSUAL BRUISING OR BLEEDING *URINARY PROBLEMS (pain or burning when urinating, or frequent urination) *BOWEL PROBLEMS (unusual diarrhea, constipation, pain near the anus) TENDERNESS IN MOUTH AND THROAT WITH OR WITHOUT PRESENCE OF ULCERS (sore throat, sores in mouth, or a toothache) UNUSUAL RASH, SWELLING OR PAIN  UNUSUAL VAGINAL DISCHARGE OR ITCHING   Items with * indicate a potential emergency and should be followed up as soon as possible or go to the Emergency Department if any problems should occur.  Please show the CHEMOTHERAPY ALERT CARD or IMMUNOTHERAPY ALERT CARD at  check-in to the Emergency Department and triage nurse.  Should you have questions after your visit or need to cancel or reschedule your appointment, please contact [facility]  Dept: [phone]  and follow the prompts.  Office hours are 8:00 a.m. to 4:30 p.m. Monday - Friday. Please note that voicemails left after 4:00 p.m. may not be returned until the following business day.  We are closed weekends and major holidays. You have access to a nurse at all times for urgent questions. Please call the main number to the clinic Dept: [phone] and follow the prompts.   For any non-urgent questions, you may also contact your provider using MyChart. We now offer e-Visits for anyone 70 and older to request care online for non-urgent symptoms. For details visit [email or web address].   Also download the MyChart app! Go to the app store, search "MyChart", open the app, select [facility], and log in with your MyChart username and password.  Due to Covid, a mask is required upon entering the hospital/clinic. If you do not have a mask, one will be given to you upon arrival. For doctor visits, patients may have 1 support person aged 21 or older with them. For treatment visits, patients cannot have anyone with them due to current Covid guidelines and our immunocompromised population.   Paclitaxel injection What is this medication? PACLITAXEL (PAK li TAX el) is a chemotherapy drug. It targets fast dividing cells, like cancer cells, and causes these cells to die. This medicine is used to treat ovarian cancer, breast cancer, lung cancer, Kaposi's sarcoma, and other cancers.  This medicine may be used for other purposes; ask your health care provider or pharmacist if you have questions. COMMON BRAND NAME(S): Onxol, Taxol What should I tell my care team before I take this medication? They need to know if you have any of these conditions: history of irregular heartbeat liver  disease low blood counts, like low white cell, platelet, or red cell counts lung or breathing disease, like asthma tingling of the fingers or toes, or other nerve disorder an unusual or allergic reaction to paclitaxel, alcohol, polyoxyethylated castor oil, other chemotherapy, other medicines, foods, dyes, or preservatives pregnant or trying to get pregnant breast-feeding How should I use this medication? This drug is given as an infusion into a vein. It is administered in a hospital or clinic by a specially trained health care professional. Talk to your pediatrician regarding the use of this medicine in children. Special care may be needed. Overdosage: If you think you have taken too much of this medicine contact a poison control center or emergency room at once. NOTE: This medicine is only for you. Do not share this medicine with others. What if I miss a dose? It is important not to miss your dose. Call your doctor or health care professional if you are unable to keep an appointment. What may interact with this medication? Do not take this medicine with any of the following medications: live virus vaccines This medicine may also interact with the following medications: antiviral medicines for hepatitis, HIV or AIDS certain antibiotics like erythromycin and clarithromycin certain medicines for fungal infections like ketoconazole and itraconazole certain medicines for seizures like carbamazepine, phenobarbital, phenytoin gemfibrozil nefazodone rifampin St. John's wort This list may not describe all possible interactions. Give your health care provider a list of all the medicines, herbs, non-prescription drugs, or dietary supplements you use. Also tell them if you smoke, drink alcohol, or use illegal drugs. Some items may interact with your medicine. What should I watch for while using this medication? Your condition will be monitored carefully while you are receiving this medicine. You  will need important blood work done while you are taking this medicine. This medicine can cause serious allergic reactions. To reduce your risk you will need to take other medicine(s) before treatment with this medicine. If you experience allergic reactions like skin rash, itching or hives, swelling of the face, lips, or tongue, tell your doctor or health care professional right away. In some cases, you may be given additional medicines to help with side effects. Follow all directions for their use. This drug may make you feel generally unwell. This is not uncommon, as chemotherapy can affect healthy cells as well as cancer cells. Report any side effects. Continue your course of treatment even though you feel ill unless your doctor tells you to stop. Call your doctor or health care professional for advice if you get a fever, chills or sore throat, or other symptoms of a cold or flu. Do not treat yourself. This drug decreases your body's ability to fight infections. Try to avoid being around people who are sick. This medicine may increase your risk to bruise or bleed. Call your doctor or health care professional if you notice any unusual bleeding. Be careful brushing and flossing your teeth or using a toothpick because you may get an infection or bleed more easily. If you have any dental work done, tell your dentist you are receiving this medicine. Avoid taking products that contain aspirin, acetaminophen, ibuprofen, naproxen, or ketoprofen unless  instructed by your doctor. These medicines may hide a fever. Do not become pregnant while taking this medicine. Women should inform their doctor if they wish to become pregnant or think they might be pregnant. There is a potential for serious side effects to an unborn child. Talk to your health care professional or pharmacist for more information. Do not breast-feed an infant while taking this medicine. Men are advised not to father a child while receiving this  medicine. This product may contain alcohol. Ask your pharmacist or healthcare provider if this medicine contains alcohol. Be sure to tell all healthcare providers you are taking this medicine. Certain medicines, like metronidazole and disulfiram, can cause an unpleasant reaction when taken with alcohol. The reaction includes flushing, headache, nausea, vomiting, sweating, and increased thirst. The reaction can last from 30 minutes to several hours. What side effects may I notice from receiving this medication? Side effects that you should report to your doctor or health care professional as soon as possible: allergic reactions like skin rash, itching or hives, swelling of the face, lips, or tongue breathing problems changes in vision fast, irregular heartbeat high or low blood pressure mouth sores pain, tingling, numbness in the hands or feet signs of decreased platelets or bleeding - bruising, pinpoint red spots on the skin, black, tarry stools, blood in the urine signs of decreased red blood cells - unusually weak or tired, feeling faint or lightheaded, falls signs of infection - fever or chills, cough, sore throat, pain or difficulty passing urine signs and symptoms of liver injury like dark yellow or brown urine; general ill feeling or flu-like symptoms; light-colored stools; loss of appetite; nausea; right upper belly pain; unusually weak or tired; yellowing of the eyes or skin swelling of the ankles, feet, hands unusually slow heartbeat Side effects that usually do not require medical attention (report to your doctor or health care professional if they continue or are bothersome): diarrhea hair loss loss of appetite muscle or joint pain nausea, vomiting pain, redness, or irritation at site where injected tiredness This list may not describe all possible side effects. Call your doctor for medical advice about side effects. You may report side effects to FDA at 1-800-FDA-1088. Where  should I keep my medication? This drug is given in a hospital or clinic and will not be stored at home. NOTE: This sheet is a summary. It may not cover all possible information. If you have questions about this medicine, talk to your doctor, pharmacist, or health care provider.  2022 [employer]/Gold Standard (2021-06-09 00:00:00)  Carboplatin injection What is this medication? CARBOPLATIN (KAR boe pla tin) is a chemotherapy drug. It targets fast dividing cells, like cancer cells, and causes these cells to die. This medicine is used to treat ovarian cancer and many other cancers. This medicine may be used for other purposes; ask your health care provider or pharmacist if you have questions. COMMON BRAND NAME(S): Paraplatin What should I tell my care team before I take this medication? They need to know if you have any of these conditions: blood disorders hearing problems kidney disease recent or ongoing radiation therapy an unusual or allergic reaction to carboplatin, cisplatin, other chemotherapy, other medicines, foods, dyes, or preservatives pregnant or trying to get pregnant breast-feeding How should I use this medication? This drug is usually given as an infusion into a vein. It is administered in a hospital or clinic by a specially trained health care professional. Talk to your pediatrician regarding the use of this  medicine in children. Special care may be needed. Overdosage: If you think you have taken too much of this medicine contact a poison control center or emergency room at once. NOTE: This medicine is only for you. Do not share this medicine with others. What if I miss a dose? It is important not to miss a dose. Call your doctor or health care professional if you are unable to keep an appointment. What may interact with this medication? medicines for seizures medicines to increase blood counts like filgrastim, pegfilgrastim, sargramostim some antibiotics like amikacin,  gentamicin, neomycin, streptomycin, tobramycin vaccines Talk to your doctor or health care professional before taking any of these medicines: acetaminophen aspirin ibuprofen ketoprofen naproxen This list may not describe all possible interactions. Give your health care provider a list of all the medicines, herbs, non-prescription drugs, or dietary supplements you use. Also tell them if you smoke, drink alcohol, or use illegal drugs. Some items may interact with your medicine. What should I watch for while using this medication? Your condition will be monitored carefully while you are receiving this medicine. You will need important blood work done while you are taking this medicine. This drug may make you feel generally unwell. This is not uncommon, as chemotherapy can affect healthy cells as well as cancer cells. Report any side effects. Continue your course of treatment even though you feel ill unless your doctor tells you to stop. In some cases, you may be given additional medicines to help with side effects. Follow all directions for their use. Call your doctor or health care professional for advice if you get a fever, chills or sore throat, or other symptoms of a cold or flu. Do not treat yourself. This drug decreases your body's ability to fight infections. Try to avoid being around people who are sick. This medicine may increase your risk to bruise or bleed. Call your doctor or health care professional if you notice any unusual bleeding. Be careful brushing and flossing your teeth or using a toothpick because you may get an infection or bleed more easily. If you have any dental work done, tell your dentist you are receiving this medicine. Avoid taking products that contain aspirin, acetaminophen, ibuprofen, naproxen, or ketoprofen unless instructed by your doctor. These medicines may hide a fever. Do not become pregnant while taking this medicine. Women should inform their doctor if they wish  to become pregnant or think they might be pregnant. There is a potential for serious side effects to an unborn child. Talk to your health care professional or pharmacist for more information. Do not breast-feed an infant while taking this medicine. What side effects may I notice from receiving this medication? Side effects that you should report to your doctor or health care professional as soon as possible: allergic reactions like skin rash, itching or hives, swelling of the face, lips, or tongue signs of infection - fever or chills, cough, sore throat, pain or difficulty passing urine signs of decreased platelets or bleeding - bruising, pinpoint red spots on the skin, black, tarry stools, nosebleeds signs of decreased red blood cells - unusually weak or tired, fainting spells, lightheadedness breathing problems changes in hearing changes in vision chest pain high blood pressure low blood counts - This drug may decrease the number of white blood cells, red blood cells and platelets. You may be at increased risk for infections and bleeding. nausea and vomiting pain, swelling, redness or irritation at the injection site pain, tingling, numbness in the hands  or feet problems with balance, talking, walking trouble passing urine or change in the amount of urine Side effects that usually do not require medical attention (report to your doctor or health care professional if they continue or are bothersome): hair loss loss of appetite metallic taste in the mouth or changes in taste This list may not describe all possible side effects. Call your doctor for medical advice about side effects. You may report side effects to FDA at 1-800-FDA-1088. Where should I keep my medication? This drug is given in a hospital or clinic and will not be stored at home. NOTE: This sheet is a summary. It may not cover all possible information. If you have questions about this medicine, talk to your doctor, pharmacist,  or health care provider.  2022 [employer]/Gold Standard (2008-02-28 00:00:00)

## 2021-10-13 NOTE — Progress Notes (Signed)
Patient tolerated tx without any problems. Patient was ready to go when chemo was completed and didn't want to have BP taken.

## 2021-10-14 LAB — CA 125: Cancer Antigen (CA) 125: 30.5 U/mL (ref 0.0–38.1)

## 2021-10-18 ENCOUNTER — Other Ambulatory Visit: Payer: Self-pay | Admitting: Oncology

## 2021-10-20 ENCOUNTER — Inpatient Hospital Stay: Payer: Medicare Other

## 2021-10-20 ENCOUNTER — Other Ambulatory Visit: Payer: Self-pay

## 2021-10-20 VITALS — BP 125/73 | HR 76 | Temp 98.4°F | Resp 20 | Ht 62.0 in | Wt 150.6 lb

## 2021-10-20 DIAGNOSIS — D701 Agranulocytosis secondary to cancer chemotherapy: Secondary | ICD-10-CM | POA: Diagnosis not present

## 2021-10-20 DIAGNOSIS — C569 Malignant neoplasm of unspecified ovary: Secondary | ICD-10-CM

## 2021-10-20 DIAGNOSIS — Z5111 Encounter for antineoplastic chemotherapy: Secondary | ICD-10-CM | POA: Diagnosis not present

## 2021-10-20 LAB — CBC WITH DIFFERENTIAL (CANCER CENTER ONLY)
Abs Immature Granulocytes: 0.02 10*3/uL (ref 0.00–0.07)
Basophils Absolute: 0 10*3/uL (ref 0.0–0.1)
Basophils Relative: 1 %
Eosinophils Absolute: 0.1 10*3/uL (ref 0.0–0.5)
Eosinophils Relative: 3 %
HCT: 36.3 % (ref 36.0–46.0)
Hemoglobin: 11.7 g/dL — ABNORMAL LOW (ref 12.0–15.0)
Immature Granulocytes: 1 %
Lymphocytes Relative: 23 %
Lymphs Abs: 0.8 10*3/uL (ref 0.7–4.0)
MCH: 32.1 pg (ref 26.0–34.0)
MCHC: 32.2 g/dL (ref 30.0–36.0)
MCV: 99.7 fL (ref 80.0–100.0)
Monocytes Absolute: 0.3 10*3/uL (ref 0.1–1.0)
Monocytes Relative: 8 %
Neutro Abs: 2.2 10*3/uL (ref 1.7–7.7)
Neutrophils Relative %: 64 %
Platelet Count: 213 10*3/uL (ref 150–400)
RBC: 3.64 MIL/uL — ABNORMAL LOW (ref 3.87–5.11)
RDW: 14.4 % (ref 11.5–15.5)
WBC Count: 3.4 10*3/uL — ABNORMAL LOW (ref 4.0–10.5)
nRBC: 0 % (ref 0.0–0.2)

## 2021-10-20 LAB — CMP (CANCER CENTER ONLY)
ALT: 23 U/L (ref 0–44)
AST: 16 U/L (ref 15–41)
Albumin: 3.9 g/dL (ref 3.5–5.0)
Alkaline Phosphatase: 46 U/L (ref 38–126)
Anion gap: 8 (ref 5–15)
BUN: 23 mg/dL (ref 8–23)
CO2: 26 mmol/L (ref 22–32)
Calcium: 8.8 mg/dL — ABNORMAL LOW (ref 8.9–10.3)
Chloride: 105 mmol/L (ref 98–111)
Creatinine: 0.58 mg/dL (ref 0.44–1.00)
GFR, Estimated: >60 mL/min (ref >60)
Glucose, Bld: 103 mg/dL — ABNORMAL HIGH (ref 70–99)
Potassium: 4 mmol/L (ref 3.5–5.1)
Sodium: 139 mmol/L (ref 135–145)
Total Bilirubin: 0.4 mg/dL (ref 0.3–1.2)
Total Protein: 6.3 g/dL — ABNORMAL LOW (ref 6.5–8.1)

## 2021-10-20 MED ORDER — SODIUM CHLORIDE 0.9 % IV SOLN
159.4000 mg | Freq: Once | INTRAVENOUS | Status: AC
  Administered 2021-10-20: 160 mg via INTRAVENOUS
  Filled 2021-10-20: qty 16

## 2021-10-20 MED ORDER — HEPARIN SOD (PORK) LOCK FLUSH 100 UNIT/ML IV SOLN
500.0000 [IU] | Freq: Once | INTRAVENOUS | Status: AC | PRN
  Administered 2021-10-20: 500 [IU]

## 2021-10-20 MED ORDER — DIPHENHYDRAMINE HCL 50 MG/ML IJ SOLN
25.0000 mg | Freq: Once | INTRAMUSCULAR | Status: AC
  Administered 2021-10-20: 25 mg via INTRAVENOUS
  Filled 2021-10-20: qty 1

## 2021-10-20 MED ORDER — SODIUM CHLORIDE 0.9 % IV SOLN
10.0000 mg | Freq: Once | INTRAVENOUS | Status: AC
  Administered 2021-10-20: 10 mg via INTRAVENOUS
  Filled 2021-10-20: qty 1

## 2021-10-20 MED ORDER — SODIUM CHLORIDE 0.9 % IV SOLN: Freq: Once | INTRAVENOUS | Status: AC

## 2021-10-20 MED ORDER — SODIUM CHLORIDE 0.9 % IV SOLN
80.0000 mg/m2 | Freq: Once | INTRAVENOUS | Status: AC
  Administered 2021-10-20: 138 mg via INTRAVENOUS
  Filled 2021-10-20: qty 23

## 2021-10-20 MED ORDER — PALONOSETRON HCL INJECTION 0.25 MG/5ML
0.2500 mg | Freq: Once | INTRAVENOUS | Status: AC
  Administered 2021-10-20: 0.25 mg via INTRAVENOUS
  Filled 2021-10-20: qty 5

## 2021-10-20 MED ORDER — SODIUM CHLORIDE 0.9% FLUSH
10.0000 mL | INTRAVENOUS | Status: DC | PRN
  Administered 2021-10-20: 10 mL

## 2021-10-20 MED ORDER — FAMOTIDINE IN NACL 20-0.9 MG/50ML-% IV SOLN
20.0000 mg | Freq: Once | INTRAVENOUS | Status: AC
  Administered 2021-10-20: 20 mg via INTRAVENOUS
  Filled 2021-10-20: qty 50

## 2021-10-20 NOTE — Patient Instructions (Signed)
[place]   Discharge Instructions: Thank you for choosing [facility] to provide your oncology and hematology care.   If you have a lab appointment with the [facility], please go directly to the [facility] and check in at the registration area.   Wear comfortable clothing and clothing appropriate for easy access to any Portacath or PICC line.   We strive to give you quality time with your provider. You may need to reschedule your appointment if you arrive late (15 or more minutes).  Arriving late affects you and other patients whose appointments are after yours.  Also, if you miss three or more appointments without notifying the office, you may be dismissed from the clinic at the providers discretion.      For prescription refill requests, have your pharmacy contact our office and allow 72 hours for refills to be completed.    Today you received the following chemotherapy and/or immunotherapy agents Taxol, Carboplatin      To help prevent nausea and vomiting after your treatment, we encourage you to take your nausea medication as directed.  BELOW ARE SYMPTOMS THAT SHOULD BE REPORTED IMMEDIATELY: *FEVER GREATER THAN 100.4 F (38 C) OR HIGHER *CHILLS OR SWEATING *NAUSEA AND VOMITING THAT IS NOT CONTROLLED WITH YOUR NAUSEA MEDICATION *UNUSUAL SHORTNESS OF BREATH *UNUSUAL BRUISING OR BLEEDING *URINARY PROBLEMS (pain or burning when urinating, or frequent urination) *BOWEL PROBLEMS (unusual diarrhea, constipation, pain near the anus) TENDERNESS IN MOUTH AND THROAT WITH OR WITHOUT PRESENCE OF ULCERS (sore throat, sores in mouth, or a toothache) UNUSUAL RASH, SWELLING OR PAIN  UNUSUAL VAGINAL DISCHARGE OR ITCHING   Items with * indicate a potential emergency and should be followed up as soon as possible or go to the Emergency Department if any problems should occur.  Please show the CHEMOTHERAPY ALERT CARD or IMMUNOTHERAPY ALERT CARD at  check-in to the Emergency Department and triage nurse.  Should you have questions after your visit or need to cancel or reschedule your appointment, please contact [facility]  Dept: [phone]  and follow the prompts.  Office hours are 8:00 a.m. to 4:30 p.m. Monday - Friday. Please note that voicemails left after 4:00 p.m. may not be returned until the following business day.  We are closed weekends and major holidays. You have access to a nurse at all times for urgent questions. Please call the main number to the clinic Dept: [phone] and follow the prompts.   For any non-urgent questions, you may also contact your provider using MyChart. We now offer e-Visits for anyone 59 and older to request care online for non-urgent symptoms. For details visit [email or web address].   Also download the MyChart app! Go to the app store, search "MyChart", open the app, select [facility], and log in with your MyChart username and password.  Due to Covid, a mask is required upon entering the hospital/clinic. If you do not have a mask, one will be given to you upon arrival. For doctor visits, patients may have 1 support person aged 82 or older with them. For treatment visits, patients cannot have anyone with them due to current Covid guidelines and our immunocompromised population.   Paclitaxel injection What is this medication? PACLITAXEL (PAK li TAX el) is a chemotherapy drug. It targets fast dividing cells, like cancer cells, and causes these cells to die. This medicine is used to treat ovarian cancer, breast cancer, lung cancer, Kaposi's sarcoma, and other cancers. This  medicine may be used for other purposes; ask your health care provider or pharmacist if you have questions. COMMON BRAND NAME(S): Onxol, Taxol What should I tell my care team before I take this medication? They need to know if you have any of these conditions: history of irregular heartbeat liver  disease low blood counts, like low white cell, platelet, or red cell counts lung or breathing disease, like asthma tingling of the fingers or toes, or other nerve disorder an unusual or allergic reaction to paclitaxel, alcohol, polyoxyethylated castor oil, other chemotherapy, other medicines, foods, dyes, or preservatives pregnant or trying to get pregnant breast-feeding How should I use this medication? This drug is given as an infusion into a vein. It is administered in a hospital or clinic by a specially trained health care professional. Talk to your pediatrician regarding the use of this medicine in children. Special care may be needed. Overdosage: If you think you have taken too much of this medicine contact a poison control center or emergency room at once. NOTE: This medicine is only for you. Do not share this medicine with others. What if I miss a dose? It is important not to miss your dose. Call your doctor or health care professional if you are unable to keep an appointment. What may interact with this medication? Do not take this medicine with any of the following medications: live virus vaccines This medicine may also interact with the following medications: antiviral medicines for hepatitis, HIV or AIDS certain antibiotics like erythromycin and clarithromycin certain medicines for fungal infections like ketoconazole and itraconazole certain medicines for seizures like carbamazepine, phenobarbital, phenytoin gemfibrozil nefazodone rifampin St. John's wort This list may not describe all possible interactions. Give your health care provider a list of all the medicines, herbs, non-prescription drugs, or dietary supplements you use. Also tell them if you smoke, drink alcohol, or use illegal drugs. Some items may interact with your medicine. What should I watch for while using this medication? Your condition will be monitored carefully while you are receiving this medicine. You  will need important blood work done while you are taking this medicine. This medicine can cause serious allergic reactions. To reduce your risk you will need to take other medicine(s) before treatment with this medicine. If you experience allergic reactions like skin rash, itching or hives, swelling of the face, lips, or tongue, tell your doctor or health care professional right away. In some cases, you may be given additional medicines to help with side effects. Follow all directions for their use. This drug may make you feel generally unwell. This is not uncommon, as chemotherapy can affect healthy cells as well as cancer cells. Report any side effects. Continue your course of treatment even though you feel ill unless your doctor tells you to stop. Call your doctor or health care professional for advice if you get a fever, chills or sore throat, or other symptoms of a cold or flu. Do not treat yourself. This drug decreases your body's ability to fight infections. Try to avoid being around people who are sick. This medicine may increase your risk to bruise or bleed. Call your doctor or health care professional if you notice any unusual bleeding. Be careful brushing and flossing your teeth or using a toothpick because you may get an infection or bleed more easily. If you have any dental work done, tell your dentist you are receiving this medicine. Avoid taking products that contain aspirin, acetaminophen, ibuprofen, naproxen, or ketoprofen unless instructed  by your doctor. These medicines may hide a fever. Do not become pregnant while taking this medicine. Women should inform their doctor if they wish to become pregnant or think they might be pregnant. There is a potential for serious side effects to an unborn child. Talk to your health care professional or pharmacist for more information. Do not breast-feed an infant while taking this medicine. Men are advised not to father a child while receiving this  medicine. This product may contain alcohol. Ask your pharmacist or healthcare provider if this medicine contains alcohol. Be sure to tell all healthcare providers you are taking this medicine. Certain medicines, like metronidazole and disulfiram, can cause an unpleasant reaction when taken with alcohol. The reaction includes flushing, headache, nausea, vomiting, sweating, and increased thirst. The reaction can last from 30 minutes to several hours. What side effects may I notice from receiving this medication? Side effects that you should report to your doctor or health care professional as soon as possible: allergic reactions like skin rash, itching or hives, swelling of the face, lips, or tongue breathing problems changes in vision fast, irregular heartbeat high or low blood pressure mouth sores pain, tingling, numbness in the hands or feet signs of decreased platelets or bleeding - bruising, pinpoint red spots on the skin, black, tarry stools, blood in the urine signs of decreased red blood cells - unusually weak or tired, feeling faint or lightheaded, falls signs of infection - fever or chills, cough, sore throat, pain or difficulty passing urine signs and symptoms of liver injury like dark yellow or brown urine; general ill feeling or flu-like symptoms; light-colored stools; loss of appetite; nausea; right upper belly pain; unusually weak or tired; yellowing of the eyes or skin swelling of the ankles, feet, hands unusually slow heartbeat Side effects that usually do not require medical attention (report to your doctor or health care professional if they continue or are bothersome): diarrhea hair loss loss of appetite muscle or joint pain nausea, vomiting pain, redness, or irritation at site where injected tiredness This list may not describe all possible side effects. Call your doctor for medical advice about side effects. You may report side effects to FDA at 1-800-FDA-1088. Where  should I keep my medication? This drug is given in a hospital or clinic and will not be stored at home. NOTE: This sheet is a summary. It may not cover all possible information. If you have questions about this medicine, talk to your doctor, pharmacist, or health care provider.  2022 Elsevier/Gold Standard (2021-06-09 00:00:00)  Carboplatin injection What is this medication? CARBOPLATIN (KAR boe pla tin) is a chemotherapy drug. It targets fast dividing cells, like cancer cells, and causes these cells to die. This medicine is used to treat ovarian cancer and many other cancers. This medicine may be used for other purposes; ask your health care provider or pharmacist if you have questions. COMMON BRAND NAME(S): Paraplatin What should I tell my care team before I take this medication? They need to know if you have any of these conditions: blood disorders hearing problems kidney disease recent or ongoing radiation therapy an unusual or allergic reaction to carboplatin, cisplatin, other chemotherapy, other medicines, foods, dyes, or preservatives pregnant or trying to get pregnant breast-feeding How should I use this medication? This drug is usually given as an infusion into a vein. It is administered in a hospital or clinic by a specially trained health care professional. Talk to your pediatrician regarding the use of this medicine  in children. Special care may be needed. Overdosage: If you think you have taken too much of this medicine contact a poison control center or emergency room at once. NOTE: This medicine is only for you. Do not share this medicine with others. What if I miss a dose? It is important not to miss a dose. Call your doctor or health care professional if you are unable to keep an appointment. What may interact with this medication? medicines for seizures medicines to increase blood counts like filgrastim, pegfilgrastim, sargramostim some antibiotics like amikacin,  gentamicin, neomycin, streptomycin, tobramycin vaccines Talk to your doctor or health care professional before taking any of these medicines: acetaminophen aspirin ibuprofen ketoprofen naproxen This list may not describe all possible interactions. Give your health care provider a list of all the medicines, herbs, non-prescription drugs, or dietary supplements you use. Also tell them if you smoke, drink alcohol, or use illegal drugs. Some items may interact with your medicine. What should I watch for while using this medication? Your condition will be monitored carefully while you are receiving this medicine. You will need important blood work done while you are taking this medicine. This drug may make you feel generally unwell. This is not uncommon, as chemotherapy can affect healthy cells as well as cancer cells. Report any side effects. Continue your course of treatment even though you feel ill unless your doctor tells you to stop. In some cases, you may be given additional medicines to help with side effects. Follow all directions for their use. Call your doctor or health care professional for advice if you get a fever, chills or sore throat, or other symptoms of a cold or flu. Do not treat yourself. This drug decreases your body's ability to fight infections. Try to avoid being around people who are sick. This medicine may increase your risk to bruise or bleed. Call your doctor or health care professional if you notice any unusual bleeding. Be careful brushing and flossing your teeth or using a toothpick because you may get an infection or bleed more easily. If you have any dental work done, tell your dentist you are receiving this medicine. Avoid taking products that contain aspirin, acetaminophen, ibuprofen, naproxen, or ketoprofen unless instructed by your doctor. These medicines may hide a fever. Do not become pregnant while taking this medicine. Women should inform their doctor if they wish  to become pregnant or think they might be pregnant. There is a potential for serious side effects to an unborn child. Talk to your health care professional or pharmacist for more information. Do not breast-feed an infant while taking this medicine. What side effects may I notice from receiving this medication? Side effects that you should report to your doctor or health care professional as soon as possible: allergic reactions like skin rash, itching or hives, swelling of the face, lips, or tongue signs of infection - fever or chills, cough, sore throat, pain or difficulty passing urine signs of decreased platelets or bleeding - bruising, pinpoint red spots on the skin, black, tarry stools, nosebleeds signs of decreased red blood cells - unusually weak or tired, fainting spells, lightheadedness breathing problems changes in hearing changes in vision chest pain high blood pressure low blood counts - This drug may decrease the number of white blood cells, red blood cells and platelets. You may be at increased risk for infections and bleeding. nausea and vomiting pain, swelling, redness or irritation at the injection site pain, tingling, numbness in the hands or  feet problems with balance, talking, walking trouble passing urine or change in the amount of urine Side effects that usually do not require medical attention (report to your doctor or health care professional if they continue or are bothersome): hair loss loss of appetite metallic taste in the mouth or changes in taste This list may not describe all possible side effects. Call your doctor for medical advice about side effects. You may report side effects to FDA at 1-800-FDA-1088. Where should I keep my medication? This drug is given in a hospital or clinic and will not be stored at home. NOTE: This sheet is a summary. It may not cover all possible information. If you have questions about this medicine, talk to your doctor, pharmacist,  or health care provider.  2022 Elsevier/Gold Standard (2008-02-28 00:00:00)

## 2021-10-20 NOTE — Progress Notes (Signed)
Patient presents for treatment. RN assessment completed along with the following:  Labs/vitals reviewed - Yes, and within treatment parameters.   Weight within 10% of previous measurement - Yes Informed consent completed and reflects current therapy/intent - Yes, on date 09/09/21             Provider progress note reviewed - pt did not see doctor today, reviewed note from 10/06/21 Treatment/Antibody/Supportive plan reviewed - Yes, and there are no adjustments needed for today's treatment. S&H and other orders reviewed - Yes, and there are no additional orders identified. Previous treatment date reviewed - Yes, and the appropriate amount of time has elapsed between treatments. Clinic Hand Off Received from - none   Patient to proceed with treatment.

## 2021-10-21 LAB — CA 125: Cancer Antigen (CA) 125: 24.7 U/mL (ref 0.0–38.1)

## 2021-10-30 ENCOUNTER — Other Ambulatory Visit: Payer: Self-pay | Admitting: Oncology

## 2021-11-03 ENCOUNTER — Inpatient Hospital Stay: Payer: Medicare Other

## 2021-11-03 ENCOUNTER — Inpatient Hospital Stay: Payer: Medicare Other | Admitting: Nurse Practitioner

## 2021-11-03 ENCOUNTER — Encounter: Payer: Self-pay | Admitting: Nurse Practitioner

## 2021-11-03 ENCOUNTER — Other Ambulatory Visit: Payer: Self-pay

## 2021-11-03 VITALS — BP 112/75 | HR 74

## 2021-11-03 VITALS — BP 116/67 | HR 82 | Temp 97.8°F | Resp 18 | Ht 62.0 in | Wt 152.0 lb

## 2021-11-03 DIAGNOSIS — C569 Malignant neoplasm of unspecified ovary: Secondary | ICD-10-CM

## 2021-11-03 DIAGNOSIS — Z5111 Encounter for antineoplastic chemotherapy: Secondary | ICD-10-CM | POA: Diagnosis not present

## 2021-11-03 DIAGNOSIS — D701 Agranulocytosis secondary to cancer chemotherapy: Secondary | ICD-10-CM | POA: Diagnosis not present

## 2021-11-03 LAB — CBC WITH DIFFERENTIAL (CANCER CENTER ONLY)
Abs Immature Granulocytes: 0.01 10*3/uL (ref 0.00–0.07)
Basophils Absolute: 0 10*3/uL (ref 0.0–0.1)
Basophils Relative: 1 %
Eosinophils Absolute: 0 10*3/uL (ref 0.0–0.5)
Eosinophils Relative: 1 %
HCT: 38.6 % (ref 36.0–46.0)
Hemoglobin: 12.4 g/dL (ref 12.0–15.0)
Immature Granulocytes: 0 %
Lymphocytes Relative: 24 %
Lymphs Abs: 0.8 10*3/uL (ref 0.7–4.0)
MCH: 32.8 pg (ref 26.0–34.0)
MCHC: 32.1 g/dL (ref 30.0–36.0)
MCV: 102.1 fL — ABNORMAL HIGH (ref 80.0–100.0)
Monocytes Absolute: 0.6 10*3/uL (ref 0.1–1.0)
Monocytes Relative: 20 %
Neutro Abs: 1.7 10*3/uL (ref 1.7–7.7)
Neutrophils Relative %: 54 %
Platelet Count: 252 10*3/uL (ref 150–400)
RBC: 3.78 MIL/uL — ABNORMAL LOW (ref 3.87–5.11)
RDW: 16.3 % — ABNORMAL HIGH (ref 11.5–15.5)
WBC Count: 3.1 10*3/uL — ABNORMAL LOW (ref 4.0–10.5)
nRBC: 0 % (ref 0.0–0.2)

## 2021-11-03 LAB — CMP (CANCER CENTER ONLY)
ALT: 20 U/L (ref 0–44)
AST: 18 U/L (ref 15–41)
Albumin: 3.9 g/dL (ref 3.5–5.0)
Alkaline Phosphatase: 52 U/L (ref 38–126)
Anion gap: 9 (ref 5–15)
BUN: 22 mg/dL (ref 8–23)
CO2: 26 mmol/L (ref 22–32)
Calcium: 8.8 mg/dL — ABNORMAL LOW (ref 8.9–10.3)
Chloride: 103 mmol/L (ref 98–111)
Creatinine: 0.64 mg/dL (ref 0.44–1.00)
GFR, Estimated: >60 mL/min (ref >60)
Glucose, Bld: 113 mg/dL — ABNORMAL HIGH (ref 70–99)
Potassium: 4.1 mmol/L (ref 3.5–5.1)
Sodium: 138 mmol/L (ref 135–145)
Total Bilirubin: 0.4 mg/dL (ref 0.3–1.2)
Total Protein: 6.7 g/dL (ref 6.5–8.1)

## 2021-11-03 MED ORDER — FAMOTIDINE IN NACL 20-0.9 MG/50ML-% IV SOLN
20.0000 mg | Freq: Once | INTRAVENOUS | Status: AC
  Administered 2021-11-03: 20 mg via INTRAVENOUS
  Filled 2021-11-03: qty 50

## 2021-11-03 MED ORDER — SODIUM CHLORIDE 0.9 % IV SOLN
159.4000 mg | Freq: Once | INTRAVENOUS | Status: AC
  Administered 2021-11-03: 160 mg via INTRAVENOUS
  Filled 2021-11-03: qty 16

## 2021-11-03 MED ORDER — HEPARIN SOD (PORK) LOCK FLUSH 100 UNIT/ML IV SOLN
500.0000 [IU] | Freq: Once | INTRAVENOUS | Status: AC | PRN
  Administered 2021-11-03: 500 [IU]

## 2021-11-03 MED ORDER — SODIUM CHLORIDE 0.9% FLUSH
10.0000 mL | INTRAVENOUS | Status: DC | PRN
  Administered 2021-11-03: 10 mL

## 2021-11-03 MED ORDER — PALONOSETRON HCL INJECTION 0.25 MG/5ML
0.2500 mg | Freq: Once | INTRAVENOUS | Status: AC
  Administered 2021-11-03: 0.25 mg via INTRAVENOUS
  Filled 2021-11-03: qty 5

## 2021-11-03 MED ORDER — DIPHENHYDRAMINE HCL 50 MG/ML IJ SOLN
25.0000 mg | Freq: Once | INTRAMUSCULAR | Status: AC
  Administered 2021-11-03: 25 mg via INTRAVENOUS
  Filled 2021-11-03: qty 1

## 2021-11-03 MED ORDER — SODIUM CHLORIDE 0.9 % IV SOLN
80.0000 mg/m2 | Freq: Once | INTRAVENOUS | Status: AC
  Administered 2021-11-03: 138 mg via INTRAVENOUS
  Filled 2021-11-03: qty 23

## 2021-11-03 MED ORDER — SODIUM CHLORIDE 0.9 % IV SOLN
10.0000 mg | Freq: Once | INTRAVENOUS | Status: AC
  Administered 2021-11-03: 10 mg via INTRAVENOUS
  Filled 2021-11-03: qty 1

## 2021-11-03 MED ORDER — SODIUM CHLORIDE 0.9 % IV SOLN: Freq: Once | INTRAVENOUS | Status: AC

## 2021-11-03 NOTE — Patient Instructions (Signed)
[facility]   Discharge Instructions: Thank you for choosing [facility] to provide your oncology and hematology care.   If you have a lab appointment with the [facility], please go directly to the [facility] and check in at the registration area.   Wear comfortable clothing and clothing appropriate for easy access to any Portacath or PICC line.   We strive to give you quality time with your provider. You may need to reschedule your appointment if you arrive late (15 or more minutes).  Arriving late affects you and other patients whose appointments are after yours.  Also, if you miss three or more appointments without notifying the office, you may be dismissed from the clinic at the providers discretion.      For prescription refill requests, have your pharmacy contact our office and allow 72 hours for refills to be completed.    Today you received the following chemotherapy and/or immunotherapy agents Taxol, Carboplatin      To help prevent nausea and vomiting after your treatment, we encourage you to take your nausea medication as directed.  BELOW ARE SYMPTOMS THAT SHOULD BE REPORTED IMMEDIATELY: *FEVER GREATER THAN 100.4 F (38 C) OR HIGHER *CHILLS OR SWEATING *NAUSEA AND VOMITING THAT IS NOT CONTROLLED WITH YOUR NAUSEA MEDICATION *UNUSUAL SHORTNESS OF BREATH *UNUSUAL BRUISING OR BLEEDING *URINARY PROBLEMS (pain or burning when urinating, or frequent urination) *BOWEL PROBLEMS (unusual diarrhea, constipation, pain near the anus) TENDERNESS IN MOUTH AND THROAT WITH OR WITHOUT PRESENCE OF ULCERS (sore throat, sores in mouth, or a toothache) UNUSUAL RASH, SWELLING OR PAIN  UNUSUAL VAGINAL DISCHARGE OR ITCHING   Items with * indicate a potential emergency and should be followed up as soon as possible or go to the Emergency Department if any problems should occur.  Please show the CHEMOTHERAPY ALERT CARD or IMMUNOTHERAPY ALERT CARD at  check-in to the Emergency Department and triage nurse.  Should you have questions after your visit or need to cancel or reschedule your appointment, please contact [facility]  Dept: [phone]  and follow the prompts.  Office hours are 8:00 a.m. to 4:30 p.m. Monday - Friday. Please note that voicemails left after 4:00 p.m. may not be returned until the following business day.  We are closed weekends and major holidays. You have access to a nurse at all times for urgent questions. Please call the main number to the clinic Dept: [phone] and follow the prompts.   For any non-urgent questions, you may also contact your provider using MyChart. We now offer e-Visits for anyone 22 and older to request care online for non-urgent symptoms. For details visit [email or web address].   Also download the MyChart app! Go to the app store, search "MyChart", open the app, select [facility], and log in with your MyChart username and password.  Due to Covid, a mask is required upon entering the hospital/clinic. If you do not have a mask, one will be given to you upon arrival. For doctor visits, patients may have 1 support person aged 5 or older with them. For treatment visits, patients cannot have anyone with them due to current Covid guidelines and our immunocompromised population.   Paclitaxel injection What is this medication? PACLITAXEL (PAK li TAX el) is a chemotherapy drug. It targets fast dividing cells, like cancer cells, and causes these cells to die. This medicine is used to treat ovarian cancer, breast cancer, lung cancer, Kaposi's sarcoma, and other cancers. This  medicine may be used for other purposes; ask your health care provider or pharmacist if you have questions. COMMON BRAND NAME(S): Onxol, Taxol What should I tell my care team before I take this medication? They need to know if you have any of these conditions: history of irregular heartbeat liver  disease low blood counts, like low white cell, platelet, or red cell counts lung or breathing disease, like asthma tingling of the fingers or toes, or other nerve disorder an unusual or allergic reaction to paclitaxel, alcohol, polyoxyethylated castor oil, other chemotherapy, other medicines, foods, dyes, or preservatives pregnant or trying to get pregnant breast-feeding How should I use this medication? This drug is given as an infusion into a vein. It is administered in a hospital or clinic by a specially trained health care professional. Talk to your pediatrician regarding the use of this medicine in children. Special care may be needed. Overdosage: If you think you have taken too much of this medicine contact a poison control center or emergency room at once. NOTE: This medicine is only for you. Do not share this medicine with others. What if I miss a dose? It is important not to miss your dose. Call your doctor or health care professional if you are unable to keep an appointment. What may interact with this medication? Do not take this medicine with any of the following medications: live virus vaccines This medicine may also interact with the following medications: antiviral medicines for hepatitis, HIV or AIDS certain antibiotics like erythromycin and clarithromycin certain medicines for fungal infections like ketoconazole and itraconazole certain medicines for seizures like carbamazepine, phenobarbital, phenytoin gemfibrozil nefazodone rifampin St. John's wort This list may not describe all possible interactions. Give your health care provider a list of all the medicines, herbs, non-prescription drugs, or dietary supplements you use. Also tell them if you smoke, drink alcohol, or use illegal drugs. Some items may interact with your medicine. What should I watch for while using this medication? Your condition will be monitored carefully while you are receiving this medicine. You  will need important blood work done while you are taking this medicine. This medicine can cause serious allergic reactions. To reduce your risk you will need to take other medicine(s) before treatment with this medicine. If you experience allergic reactions like skin rash, itching or hives, swelling of the face, lips, or tongue, tell your doctor or health care professional right away. In some cases, you may be given additional medicines to help with side effects. Follow all directions for their use. This drug may make you feel generally unwell. This is not uncommon, as chemotherapy can affect healthy cells as well as cancer cells. Report any side effects. Continue your course of treatment even though you feel ill unless your doctor tells you to stop. Call your doctor or health care professional for advice if you get a fever, chills or sore throat, or other symptoms of a cold or flu. Do not treat yourself. This drug decreases your body's ability to fight infections. Try to avoid being around people who are sick. This medicine may increase your risk to bruise or bleed. Call your doctor or health care professional if you notice any unusual bleeding. Be careful brushing and flossing your teeth or using a toothpick because you may get an infection or bleed more easily. If you have any dental work done, tell your dentist you are receiving this medicine. Avoid taking products that contain aspirin, acetaminophen, ibuprofen, naproxen, or ketoprofen unless instructed  by your doctor. These medicines may hide a fever. Do not become pregnant while taking this medicine. Women should inform their doctor if they wish to become pregnant or think they might be pregnant. There is a potential for serious side effects to an unborn child. Talk to your health care professional or pharmacist for more information. Do not breast-feed an infant while taking this medicine. Men are advised not to father a child while receiving this  medicine. This product may contain alcohol. Ask your pharmacist or healthcare provider if this medicine contains alcohol. Be sure to tell all healthcare providers you are taking this medicine. Certain medicines, like metronidazole and disulfiram, can cause an unpleasant reaction when taken with alcohol. The reaction includes flushing, headache, nausea, vomiting, sweating, and increased thirst. The reaction can last from 30 minutes to several hours. What side effects may I notice from receiving this medication? Side effects that you should report to your doctor or health care professional as soon as possible: allergic reactions like skin rash, itching or hives, swelling of the face, lips, or tongue breathing problems changes in vision fast, irregular heartbeat high or low blood pressure mouth sores pain, tingling, numbness in the hands or feet signs of decreased platelets or bleeding - bruising, pinpoint red spots on the skin, black, tarry stools, blood in the urine signs of decreased red blood cells - unusually weak or tired, feeling faint or lightheaded, falls signs of infection - fever or chills, cough, sore throat, pain or difficulty passing urine signs and symptoms of liver injury like dark yellow or brown urine; general ill feeling or flu-like symptoms; light-colored stools; loss of appetite; nausea; right upper belly pain; unusually weak or tired; yellowing of the eyes or skin swelling of the ankles, feet, hands unusually slow heartbeat Side effects that usually do not require medical attention (report to your doctor or health care professional if they continue or are bothersome): diarrhea hair loss loss of appetite muscle or joint pain nausea, vomiting pain, redness, or irritation at site where injected tiredness This list may not describe all possible side effects. Call your doctor for medical advice about side effects. You may report side effects to FDA at 1-800-FDA-1088. Where  should I keep my medication? This drug is given in a hospital or clinic and will not be stored at home. NOTE: This sheet is a summary. It may not cover all possible information. If you have questions about this medicine, talk to your doctor, pharmacist, or health care provider.  2022 Elsevier/Gold Standard (2021-06-09 00:00:00)  Carboplatin injection What is this medication? CARBOPLATIN (KAR boe pla tin) is a chemotherapy drug. It targets fast dividing cells, like cancer cells, and causes these cells to die. This medicine is used to treat ovarian cancer and many other cancers. This medicine may be used for other purposes; ask your health care provider or pharmacist if you have questions. COMMON BRAND NAME(S): Paraplatin What should I tell my care team before I take this medication? They need to know if you have any of these conditions: blood disorders hearing problems kidney disease recent or ongoing radiation therapy an unusual or allergic reaction to carboplatin, cisplatin, other chemotherapy, other medicines, foods, dyes, or preservatives pregnant or trying to get pregnant breast-feeding How should I use this medication? This drug is usually given as an infusion into a vein. It is administered in a hospital or clinic by a specially trained health care professional. Talk to your pediatrician regarding the use of this medicine  in children. Special care may be needed. Overdosage: If you think you have taken too much of this medicine contact a poison control center or emergency room at once. NOTE: This medicine is only for you. Do not share this medicine with others. What if I miss a dose? It is important not to miss a dose. Call your doctor or health care professional if you are unable to keep an appointment. What may interact with this medication? medicines for seizures medicines to increase blood counts like filgrastim, pegfilgrastim, sargramostim some antibiotics like amikacin,  gentamicin, neomycin, streptomycin, tobramycin vaccines Talk to your doctor or health care professional before taking any of these medicines: acetaminophen aspirin ibuprofen ketoprofen naproxen This list may not describe all possible interactions. Give your health care provider a list of all the medicines, herbs, non-prescription drugs, or dietary supplements you use. Also tell them if you smoke, drink alcohol, or use illegal drugs. Some items may interact with your medicine. What should I watch for while using this medication? Your condition will be monitored carefully while you are receiving this medicine. You will need important blood work done while you are taking this medicine. This drug may make you feel generally unwell. This is not uncommon, as chemotherapy can affect healthy cells as well as cancer cells. Report any side effects. Continue your course of treatment even though you feel ill unless your doctor tells you to stop. In some cases, you may be given additional medicines to help with side effects. Follow all directions for their use. Call your doctor or health care professional for advice if you get a fever, chills or sore throat, or other symptoms of a cold or flu. Do not treat yourself. This drug decreases your body's ability to fight infections. Try to avoid being around people who are sick. This medicine may increase your risk to bruise or bleed. Call your doctor or health care professional if you notice any unusual bleeding. Be careful brushing and flossing your teeth or using a toothpick because you may get an infection or bleed more easily. If you have any dental work done, tell your dentist you are receiving this medicine. Avoid taking products that contain aspirin, acetaminophen, ibuprofen, naproxen, or ketoprofen unless instructed by your doctor. These medicines may hide a fever. Do not become pregnant while taking this medicine. Women should inform their doctor if they wish  to become pregnant or think they might be pregnant. There is a potential for serious side effects to an unborn child. Talk to your health care professional or pharmacist for more information. Do not breast-feed an infant while taking this medicine. What side effects may I notice from receiving this medication? Side effects that you should report to your doctor or health care professional as soon as possible: allergic reactions like skin rash, itching or hives, swelling of the face, lips, or tongue signs of infection - fever or chills, cough, sore throat, pain or difficulty passing urine signs of decreased platelets or bleeding - bruising, pinpoint red spots on the skin, black, tarry stools, nosebleeds signs of decreased red blood cells - unusually weak or tired, fainting spells, lightheadedness breathing problems changes in hearing changes in vision chest pain high blood pressure low blood counts - This drug may decrease the number of white blood cells, red blood cells and platelets. You may be at increased risk for infections and bleeding. nausea and vomiting pain, swelling, redness or irritation at the injection site pain, tingling, numbness in the hands or  feet problems with balance, talking, walking trouble passing urine or change in the amount of urine Side effects that usually do not require medical attention (report to your doctor or health care professional if they continue or are bothersome): hair loss loss of appetite metallic taste in the mouth or changes in taste This list may not describe all possible side effects. Call your doctor for medical advice about side effects. You may report side effects to FDA at 1-800-FDA-1088. Where should I keep my medication? This drug is given in a hospital or clinic and will not be stored at home. NOTE: This sheet is a summary. It may not cover all possible information. If you have questions about this medicine, talk to your doctor, pharmacist,  or health care provider.  2022 Elsevier/Gold Standard (2008-02-28 00:00:00)

## 2021-11-03 NOTE — Progress Notes (Signed)
[facility] OFFICE PROGRESS NOTE   Diagnosis: Ovarian cancer  INTERVAL HISTORY:   [name] [name] returns as scheduled.  She completed another cycle of Taxol/carboplatin on 10/20/2021.  She had a few episodes of mild nausea on days 2 and 3.  No mouth sores.  No diarrhea or constipation.  She has occasional numbness/tingling in the feet.  This predated chemotherapy.  Mild blood with nose blowing a few days after each treatment.  No other bleeding.  Objective:  Vital signs in last 24 hours:  Blood pressure 116/67, pulse 82, temperature 97.8 F (36.6 C), temperature source Oral, resp. rate 18, height '5\' 2"'  (1.575 m), weight 152 lb (68.9 kg), SpO2 98 %.    HEENT: No thrush or ulcers. Resp: Lungs clear bilaterally. Cardio: Regular rate and rhythm. GI: Abdomen soft and nontender.  No hepatosplenomegaly.  No mass. Vascular: No leg edema. Neuro: Vibratory sense minimally decreased to intact over the fingertips per tuning fork exam. Skin: No rash. Port-A-Cath without erythema.   Lab Results:  Lab Results  Component Value Date   WBC 3.1 (L) 11/03/2021   HGB 12.4 11/03/2021   HCT 38.6 11/03/2021   MCV 102.1 (H) 11/03/2021   PLT 252 11/03/2021   NEUTROABS 1.7 11/03/2021    Imaging:  No results found.  Medications: I have reviewed the patient's current medications.  Assessment/Plan:  1. 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 A 5 mm nodules remain on the right diaphragm. - TumorNext paired germline/tumor analyses: No somatic variants detected, germline CHEK2      VUS      Cycle 1 of adjuvant Taxol/carboplatin chemotherapy initiated on 09/19/2012.   The CA 125 normalized.   She completed day 15 of cycle 6 on 02/06/2013.   Restaging CT evaluation 03/29/2013 showed no evidence of metastatic disease in the chest. There  was marked improvement in appearance/resolution of previous described peritoneal/omental disease. There was no convincing evidence of residual disease. There was minimal increased density in the region of the omentum favored to be treatment-related. There was no pelvic adenopathy. CA125 3.2 on 02/19/2014. 08/18/2016 CA-125 8 CT abdomen/pelvis 08/25/2016-solitary new enlarged right external iliac lymph node measuring 2.2 cm. Biopsy 09/07/2016-adenocarcinoma consistent with high-grade serous carcinoma. PET scan 09/22/2016-malignant range FDG uptake associated with the enlarged right external iliac lymph node compatible with metastatic adenopathy.  No additional sites of metastatic disease identified. Radiation 10/25/2016-12/01/2016 PET scan 03/28/2017-previously enlarged hypermetabolic right external iliac node-normal in size with resolution of hypermetabolic activity, no active malignancy identified PET scan 05/15/2018-no evidence of recurrent or metastatic disease, no hypermetabolic lymph nodes CT abdomen/pelvis 09/19/2019-mild increase in the size of several left upper quadrant peritoneal nodules measuring up to 11 mm.  No other sites of metastatic disease identified.  No ascites. CT abdomen/pelvis 03/14/2020-slight enlargement of several left upper quadrant peritoneal nodules, no ascites, no other evidence of disease progression CT abdomen/pelvis 09/12/2020 peritoneal nodularity/omental caking predominantly in the left upper/mid abdomen, mildly progressive.  Largest implant in the left upper abdomen adjacent to the stomach now measures 17 mm, previously 13 mm.  Overall volume of peritoneal disease has mildly progressed. CT abdomen/pelvis 01/13/2021- 4 mm right ureteral calculus with moderate right hydronephrosis and upper right hydroureter, progressive omental nodularity, stable calcified and partially calcified right pelvic nodules CT abdomen/pelvis 05/13/2021-enlargement of left upper quadrant omental  nodules and a peritoneal nodule at the right iliac fossa, no ascites  CT abdomen/pelvis 08/14/2021-mild increase in size of omental nodules, no ascites, no new site of metastatic disease Taxol/carboplatin 09/09/2021, 09/15/2021, 09/22/2021 Taxol/carboplatin 10/07/2021, 10/13/2021, 10/20/2021 Taxol/Carboplatin 11/03/2021   2. Low abdomen/suprapubic pain prior to the exploratory laparotomy-likely secondary to omental/pelvic tumor; persistent mild pain in the lower abdomen   3. Chronic neck and back pain.   4. Anxiety -persistent despite Lexapro and Xanax. She has been evaluated by psychiatry. Currently on Lexapro. 5. Status post Port-A-Cath placement 09/22/2012. The Port-A-Cath was removed on 04/03/2013.   6. Neutropenia secondary to chemotherapy- day 15 cycle 1 and cycle 3. Taxol/carboplatin not given secondary to neutropenia.    7. Herpes zoster involving a right thoracic dermatome July 2015. She completed a course of Valtrex. 8. Nodular bony prominence at the left pelvis on rectal exam 06/05/2015-likely a benign finding 9.  Right ureter stone/hydroureteronephrosis on CT 01/13/2021-referred to urology; status post right ureteroscopy with stone extraction and ureteral stent placement.  Stent removal 03/11/2021      Disposition: [name] [name] appears stable.  She has completed 2 cycles of Taxol/carboplatin.  She is tolerating treatment well overall.  Plan to proceed with cycle 3 today as scheduled.  Restaging CTs prior to next office visit.  CBC and chemistry panel from today reviewed.  Labs adequate to proceed as above.  We will see her in follow-up on 12/01/2021.  We are available to see her sooner if needed.   Ned Card ANP/GNP-BC   11/03/2021  8:28 AM

## 2021-11-03 NOTE — Progress Notes (Signed)
Patient presents for treatment. RN assessment completed along with the following:  Labs/vitals reviewed - Yes, and within treatment parameters.   Weight within 10% of previous measurement - Yes Informed consent completed and reflects current therapy/intent - Yes, on date 09/09/22             Provider progress note reviewed - Today's provider note is not yet available. I reviewed the most recent oncology provider progress note in chart dated 10/06/21. Treatment/Antibody/Supportive plan reviewed - Yes, and there are no adjustments needed for today's treatment. S&H and other orders reviewed - Yes, and there are no additional orders identified. Previous treatment date reviewed - Yes, and the appropriate amount of time has elapsed between treatments. Clinic Hand Off Received from - Leander Rams, NP  Patient to proceed with treatment.

## 2021-11-04 LAB — CA 125: Cancer Antigen (CA) 125: 22.9 U/mL (ref 0.0–38.1)

## 2021-11-07 ENCOUNTER — Other Ambulatory Visit: Payer: Self-pay | Admitting: Oncology

## 2021-11-10 ENCOUNTER — Inpatient Hospital Stay: Payer: Medicare Other | Attending: Oncology

## 2021-11-10 ENCOUNTER — Inpatient Hospital Stay: Payer: Medicare Other

## 2021-11-10 ENCOUNTER — Other Ambulatory Visit: Payer: Self-pay

## 2021-11-10 VITALS — BP 109/72 | HR 88 | Temp 99.3°F | Resp 18 | Ht 62.0 in | Wt 151.0 lb

## 2021-11-10 DIAGNOSIS — Z5111 Encounter for antineoplastic chemotherapy: Secondary | ICD-10-CM | POA: Diagnosis not present

## 2021-11-10 DIAGNOSIS — C569 Malignant neoplasm of unspecified ovary: Secondary | ICD-10-CM

## 2021-11-10 DIAGNOSIS — R971 Elevated cancer antigen 125 [CA 125]: Secondary | ICD-10-CM | POA: Diagnosis not present

## 2021-11-10 LAB — CMP (CANCER CENTER ONLY)
ALT: 21 U/L (ref 0–44)
AST: 16 U/L (ref 15–41)
Albumin: 3.9 g/dL (ref 3.5–5.0)
Alkaline Phosphatase: 48 U/L (ref 38–126)
Anion gap: 10 (ref 5–15)
BUN: 24 mg/dL — ABNORMAL HIGH (ref 8–23)
CO2: 25 mmol/L (ref 22–32)
Calcium: 8.9 mg/dL (ref 8.9–10.3)
Chloride: 104 mmol/L (ref 98–111)
Creatinine: 0.61 mg/dL (ref 0.44–1.00)
GFR, Estimated: >60 mL/min (ref >60)
Glucose, Bld: 94 mg/dL (ref 70–99)
Potassium: 3.7 mmol/L (ref 3.5–5.1)
Sodium: 139 mmol/L (ref 135–145)
Total Bilirubin: 0.4 mg/dL (ref 0.3–1.2)
Total Protein: 6.6 g/dL (ref 6.5–8.1)

## 2021-11-10 LAB — CBC WITH DIFFERENTIAL (CANCER CENTER ONLY)
Abs Immature Granulocytes: 0.05 10*3/uL (ref 0.00–0.07)
Basophils Absolute: 0 10*3/uL (ref 0.0–0.1)
Basophils Relative: 1 %
Eosinophils Absolute: 0 10*3/uL (ref 0.0–0.5)
Eosinophils Relative: 1 %
HCT: 38.1 % (ref 36.0–46.0)
Hemoglobin: 12.2 g/dL (ref 12.0–15.0)
Immature Granulocytes: 1 %
Lymphocytes Relative: 12 %
Lymphs Abs: 0.5 10*3/uL — ABNORMAL LOW (ref 0.7–4.0)
MCH: 32.4 pg (ref 26.0–34.0)
MCHC: 32 g/dL (ref 30.0–36.0)
MCV: 101.3 fL — ABNORMAL HIGH (ref 80.0–100.0)
Monocytes Absolute: 0.5 10*3/uL (ref 0.1–1.0)
Monocytes Relative: 12 %
Neutro Abs: 2.9 10*3/uL (ref 1.7–7.7)
Neutrophils Relative %: 73 %
Platelet Count: 228 10*3/uL (ref 150–400)
RBC: 3.76 MIL/uL — ABNORMAL LOW (ref 3.87–5.11)
RDW: 16.2 % — ABNORMAL HIGH (ref 11.5–15.5)
WBC Count: 4 10*3/uL (ref 4.0–10.5)
nRBC: 0 % (ref 0.0–0.2)

## 2021-11-10 MED ORDER — SODIUM CHLORIDE 0.9 % IV SOLN
80.0000 mg/m2 | Freq: Once | INTRAVENOUS | Status: AC
  Administered 2021-11-10: 138 mg via INTRAVENOUS
  Filled 2021-11-10: qty 23

## 2021-11-10 MED ORDER — SODIUM CHLORIDE 0.9 % IV SOLN
159.4000 mg | Freq: Once | INTRAVENOUS | Status: AC
  Administered 2021-11-10: 160 mg via INTRAVENOUS
  Filled 2021-11-10: qty 16

## 2021-11-10 MED ORDER — HEPARIN SOD (PORK) LOCK FLUSH 100 UNIT/ML IV SOLN
500.0000 [IU] | Freq: Once | INTRAVENOUS | Status: AC | PRN
  Administered 2021-11-10: 500 [IU]

## 2021-11-10 MED ORDER — PALONOSETRON HCL INJECTION 0.25 MG/5ML
0.2500 mg | Freq: Once | INTRAVENOUS | Status: AC
  Administered 2021-11-10: 0.25 mg via INTRAVENOUS
  Filled 2021-11-10: qty 5

## 2021-11-10 MED ORDER — SODIUM CHLORIDE 0.9 % IV SOLN
10.0000 mg | Freq: Once | INTRAVENOUS | Status: AC
  Administered 2021-11-10: 10 mg via INTRAVENOUS
  Filled 2021-11-10: qty 1

## 2021-11-10 MED ORDER — SODIUM CHLORIDE 0.9 % IV SOLN: Freq: Once | INTRAVENOUS | Status: AC

## 2021-11-10 MED ORDER — DIPHENHYDRAMINE HCL 50 MG/ML IJ SOLN
25.0000 mg | Freq: Once | INTRAMUSCULAR | Status: AC
  Administered 2021-11-10: 25 mg via INTRAVENOUS
  Filled 2021-11-10: qty 1

## 2021-11-10 MED ORDER — FAMOTIDINE IN NACL 20-0.9 MG/50ML-% IV SOLN
20.0000 mg | Freq: Once | INTRAVENOUS | Status: AC
  Administered 2021-11-10: 20 mg via INTRAVENOUS
  Filled 2021-11-10: qty 50

## 2021-11-10 MED ORDER — SODIUM CHLORIDE 0.9% FLUSH
10.0000 mL | INTRAVENOUS | Status: DC | PRN
  Administered 2021-11-10: 10 mL

## 2021-11-10 NOTE — Progress Notes (Signed)
Patient presents for treatment. RN assessment completed along with the following:  Labs/vitals reviewed - Yes, per Lattie Haw, NP: okay to proceed with elevated temp of 100.5   Weight within 10% of previous measurement - Yes Informed consent completed and reflects current therapy/intent - Yes, on date 09/06/2021             Provider progress note reviewed - Patient not seen by provider today. Most recent note dated 11/03/2021 reviewed. Treatment/Antibody/Supportive plan reviewed - Yes, and there are no adjustments needed for today's treatment. S&H and other orders reviewed - Yes, and there are no additional orders identified. Previous treatment date reviewed - Yes, and the appropriate amount of time has elapsed between treatments. Clinic Hand Off Received from - No.  Patient to proceed with treatment.

## 2021-11-10 NOTE — Patient Instructions (Signed)
[name] [name]   Discharge Instructions: Thank you for choosing [facility] to provide your oncology and hematology care.   If you have a lab appointment with the [facility], please go directly to the [facility] and check in at the registration area.   Wear comfortable clothing and clothing appropriate for easy access to any Portacath or PICC line.   We strive to give you quality time with your provider. You may need to reschedule your appointment if you arrive late (15 or more minutes).  Arriving late affects you and other patients whose appointments are after yours.  Also, if you miss three or more appointments without notifying the office, you may be dismissed from the clinic at the providers discretion.      For prescription refill requests, have your pharmacy contact our office and allow 72 hours for refills to be completed.    Today you received the following chemotherapy and/or immunotherapy agents Paclitaxel (TAXOL) & Carboplatin (PARAPLATIN).      To help prevent nausea and vomiting after your treatment, we encourage you to take your nausea medication as directed.  BELOW ARE SYMPTOMS THAT SHOULD BE REPORTED IMMEDIATELY: *FEVER GREATER THAN 100.4 F (38 C) OR HIGHER *CHILLS OR SWEATING *NAUSEA AND VOMITING THAT IS NOT CONTROLLED WITH YOUR NAUSEA MEDICATION *UNUSUAL SHORTNESS OF BREATH *UNUSUAL BRUISING OR BLEEDING *URINARY PROBLEMS (pain or burning when urinating, or frequent urination) *BOWEL PROBLEMS (unusual diarrhea, constipation, pain near the anus) TENDERNESS IN MOUTH AND THROAT WITH OR WITHOUT PRESENCE OF ULCERS (sore throat, sores in mouth, or a toothache) UNUSUAL RASH, SWELLING OR PAIN  UNUSUAL VAGINAL DISCHARGE OR ITCHING   Items with * indicate a potential emergency and should be followed up as soon as possible or go to the Emergency Department if any problems should occur.  Please show the CHEMOTHERAPY ALERT CARD or  IMMUNOTHERAPY ALERT CARD at check-in to the Emergency Department and triage nurse.  Should you have questions after your visit or need to cancel or reschedule your appointment, please contact [facility]  Dept: [phone]  and follow the prompts.  Office hours are 8:00 a.m. to 4:30 p.m. Monday - Friday. Please note that voicemails left after 4:00 p.m. may not be returned until the following business day.  We are closed weekends and major holidays. You have access to a nurse at all times for urgent questions. Please call the main number to the clinic Dept: [phone] and follow the prompts.   For any non-urgent questions, you may also contact your provider using MyChart. We now offer e-Visits for anyone 50 and older to request care online for non-urgent symptoms. For details visit [email or web address].   Also download the MyChart app! Go to the app store, search "MyChart", open the app, select [facility], and log in with your MyChart username and password.  Due to Covid, a mask is required upon entering the hospital/clinic. If you do not have a mask, one will be given to you upon arrival. For doctor visits, patients may have 1 support person aged 34 or older with them. For treatment visits, patients cannot have anyone with them due to current Covid guidelines and our immunocompromised population.   Paclitaxel injection What is this medication? PACLITAXEL (PAK li TAX el) is a chemotherapy drug. It targets fast dividing cells, like cancer cells, and causes these cells to die. This medicine is used to treat ovarian cancer, breast cancer, lung cancer, Kaposi's sarcoma, and  other cancers. This medicine may be used for other purposes; ask your health care provider or pharmacist if you have questions. COMMON BRAND NAME(S): Onxol, Taxol What should I tell my care team before I take this medication? They need to know if you have any of these conditions: history of irregular  heartbeat liver disease low blood counts, like low white cell, platelet, or red cell counts lung or breathing disease, like asthma tingling of the fingers or toes, or other nerve disorder an unusual or allergic reaction to paclitaxel, alcohol, polyoxyethylated castor oil, other chemotherapy, other medicines, foods, dyes, or preservatives pregnant or trying to get pregnant breast-feeding How should I use this medication? This drug is given as an infusion into a vein. It is administered in a hospital or clinic by a specially trained health care professional. Talk to your pediatrician regarding the use of this medicine in children. Special care may be needed. Overdosage: If you think you have taken too much of this medicine contact a poison control center or emergency room at once. NOTE: This medicine is only for you. Do not share this medicine with others. What if I miss a dose? It is important not to miss your dose. Call your doctor or health care professional if you are unable to keep an appointment. What may interact with this medication? Do not take this medicine with any of the following medications: live virus vaccines This medicine may also interact with the following medications: antiviral medicines for hepatitis, HIV or AIDS certain antibiotics like erythromycin and clarithromycin certain medicines for fungal infections like ketoconazole and itraconazole certain medicines for seizures like carbamazepine, phenobarbital, phenytoin gemfibrozil nefazodone rifampin St. John's wort This list may not describe all possible interactions. Give your health care provider a list of all the medicines, herbs, non-prescription drugs, or dietary supplements you use. Also tell them if you smoke, drink alcohol, or use illegal drugs. Some items may interact with your medicine. What should I watch for while using this medication? Your condition will be monitored carefully while you are receiving this  medicine. You will need important blood work done while you are taking this medicine. This medicine can cause serious allergic reactions. To reduce your risk you will need to take other medicine(s) before treatment with this medicine. If you experience allergic reactions like skin rash, itching or hives, swelling of the face, lips, or tongue, tell your doctor or health care professional right away. In some cases, you may be given additional medicines to help with side effects. Follow all directions for their use. This drug may make you feel generally unwell. This is not uncommon, as chemotherapy can affect healthy cells as well as cancer cells. Report any side effects. Continue your course of treatment even though you feel ill unless your doctor tells you to stop. Call your doctor or health care professional for advice if you get a fever, chills or sore throat, or other symptoms of a cold or flu. Do not treat yourself. This drug decreases your body's ability to fight infections. Try to avoid being around people who are sick. This medicine may increase your risk to bruise or bleed. Call your doctor or health care professional if you notice any unusual bleeding. Be careful brushing and flossing your teeth or using a toothpick because you may get an infection or bleed more easily. If you have any dental work done, tell your dentist you are receiving this medicine. Avoid taking products that contain aspirin, acetaminophen, ibuprofen, naproxen, or  ketoprofen unless instructed by your doctor. These medicines may hide a fever. Do not become pregnant while taking this medicine. Women should inform their doctor if they wish to become pregnant or think they might be pregnant. There is a potential for serious side effects to an unborn child. Talk to your health care professional or pharmacist for more information. Do not breast-feed an infant while taking this medicine. Men are advised not to father a child while  receiving this medicine. This product may contain alcohol. Ask your pharmacist or healthcare provider if this medicine contains alcohol. Be sure to tell all healthcare providers you are taking this medicine. Certain medicines, like metronidazole and disulfiram, can cause an unpleasant reaction when taken with alcohol. The reaction includes flushing, headache, nausea, vomiting, sweating, and increased thirst. The reaction can last from 30 minutes to several hours. What side effects may I notice from receiving this medication? Side effects that you should report to your doctor or health care professional as soon as possible: allergic reactions like skin rash, itching or hives, swelling of the face, lips, or tongue breathing problems changes in vision fast, irregular heartbeat high or low blood pressure mouth sores pain, tingling, numbness in the hands or feet signs of decreased platelets or bleeding - bruising, pinpoint red spots on the skin, black, tarry stools, blood in the urine signs of decreased red blood cells - unusually weak or tired, feeling faint or lightheaded, falls signs of infection - fever or chills, cough, sore throat, pain or difficulty passing urine signs and symptoms of liver injury like dark yellow or brown urine; general ill feeling or flu-like symptoms; light-colored stools; loss of appetite; nausea; right upper belly pain; unusually weak or tired; yellowing of the eyes or skin swelling of the ankles, feet, hands unusually slow heartbeat Side effects that usually do not require medical attention (report to your doctor or health care professional if they continue or are bothersome): diarrhea hair loss loss of appetite muscle or joint pain nausea, vomiting pain, redness, or irritation at site where injected tiredness This list may not describe all possible side effects. Call your doctor for medical advice about side effects. You may report side effects to FDA at  1-800-FDA-1088. Where should I keep my medication? This drug is given in a hospital or clinic and will not be stored at home. NOTE: This sheet is a summary. It may not cover all possible information. If you have questions about this medicine, talk to your doctor, pharmacist, or health care provider.  2022 Elsevier/Gold Standard (2021-06-09 00:00:00)  Carboplatin injection What is this medication? CARBOPLATIN (KAR boe pla tin) is a chemotherapy drug. It targets fast dividing cells, like cancer cells, and causes these cells to die. This medicine is used to treat ovarian cancer and many other cancers. This medicine may be used for other purposes; ask your health care provider or pharmacist if you have questions. COMMON BRAND NAME(S): Paraplatin What should I tell my care team before I take this medication? They need to know if you have any of these conditions: blood disorders hearing problems kidney disease recent or ongoing radiation therapy an unusual or allergic reaction to carboplatin, cisplatin, other chemotherapy, other medicines, foods, dyes, or preservatives pregnant or trying to get pregnant breast-feeding How should I use this medication? This drug is usually given as an infusion into a vein. It is administered in a hospital or clinic by a specially trained health care professional. Talk to your pediatrician regarding the use  of this medicine in children. Special care may be needed. Overdosage: If you think you have taken too much of this medicine contact a poison control center or emergency room at once. NOTE: This medicine is only for you. Do not share this medicine with others. What if I miss a dose? It is important not to miss a dose. Call your doctor or health care professional if you are unable to keep an appointment. What may interact with this medication? medicines for seizures medicines to increase blood counts like filgrastim, pegfilgrastim, sargramostim some  antibiotics like amikacin, gentamicin, neomycin, streptomycin, tobramycin vaccines Talk to your doctor or health care professional before taking any of these medicines: acetaminophen aspirin ibuprofen ketoprofen naproxen This list may not describe all possible interactions. Give your health care provider a list of all the medicines, herbs, non-prescription drugs, or dietary supplements you use. Also tell them if you smoke, drink alcohol, or use illegal drugs. Some items may interact with your medicine. What should I watch for while using this medication? Your condition will be monitored carefully while you are receiving this medicine. You will need important blood work done while you are taking this medicine. This drug may make you feel generally unwell. This is not uncommon, as chemotherapy can affect healthy cells as well as cancer cells. Report any side effects. Continue your course of treatment even though you feel ill unless your doctor tells you to stop. In some cases, you may be given additional medicines to help with side effects. Follow all directions for their use. Call your doctor or health care professional for advice if you get a fever, chills or sore throat, or other symptoms of a cold or flu. Do not treat yourself. This drug decreases your body's ability to fight infections. Try to avoid being around people who are sick. This medicine may increase your risk to bruise or bleed. Call your doctor or health care professional if you notice any unusual bleeding. Be careful brushing and flossing your teeth or using a toothpick because you may get an infection or bleed more easily. If you have any dental work done, tell your dentist you are receiving this medicine. Avoid taking products that contain aspirin, acetaminophen, ibuprofen, naproxen, or ketoprofen unless instructed by your doctor. These medicines may hide a fever. Do not become pregnant while taking this medicine. Women should  inform their doctor if they wish to become pregnant or think they might be pregnant. There is a potential for serious side effects to an unborn child. Talk to your health care professional or pharmacist for more information. Do not breast-feed an infant while taking this medicine. What side effects may I notice from receiving this medication? Side effects that you should report to your doctor or health care professional as soon as possible: allergic reactions like skin rash, itching or hives, swelling of the face, lips, or tongue signs of infection - fever or chills, cough, sore throat, pain or difficulty passing urine signs of decreased platelets or bleeding - bruising, pinpoint red spots on the skin, black, tarry stools, nosebleeds signs of decreased red blood cells - unusually weak or tired, fainting spells, lightheadedness breathing problems changes in hearing changes in vision chest pain high blood pressure low blood counts - This drug may decrease the number of white blood cells, red blood cells and platelets. You may be at increased risk for infections and bleeding. nausea and vomiting pain, swelling, redness or irritation at the injection site pain, tingling, numbness in  the hands or feet problems with balance, talking, walking trouble passing urine or change in the amount of urine Side effects that usually do not require medical attention (report to your doctor or health care professional if they continue or are bothersome): hair loss loss of appetite metallic taste in the mouth or changes in taste This list may not describe all possible side effects. Call your doctor for medical advice about side effects. You may report side effects to FDA at 1-800-FDA-1088. Where should I keep my medication? This drug is given in a hospital or clinic and will not be stored at home. NOTE: This sheet is a summary. It may not cover all possible information. If you have questions about this medicine,  talk to your doctor, pharmacist, or health care provider.  2022 Elsevier/Gold Standard (2008-02-28 00:00:00)

## 2021-11-15 ENCOUNTER — Other Ambulatory Visit: Payer: Self-pay | Admitting: Oncology

## 2021-11-16 ENCOUNTER — Telehealth: Payer: Self-pay

## 2021-11-16 NOTE — Telephone Encounter (Signed)
Patient called and left a voicemail stated she have a cough and she was not sure if she can come in for her tx. Spoke with the patient she take a Covid test negative, no fever,no headache, or no pain. She just have a cough. Patient stated she felt better today than yesterday. Advice the patient to come in for tx and to wear her mask all time. Patient voiced understanding of instruction and had no further questions or concerns at this time.

## 2021-11-17 ENCOUNTER — Other Ambulatory Visit: Payer: Self-pay

## 2021-11-17 ENCOUNTER — Encounter: Payer: Self-pay | Admitting: Licensed Clinical Social Worker

## 2021-11-17 ENCOUNTER — Other Ambulatory Visit: Payer: Self-pay | Admitting: *Deleted

## 2021-11-17 ENCOUNTER — Inpatient Hospital Stay: Payer: Medicare Other

## 2021-11-17 VITALS — BP 131/91 | HR 72 | Temp 98.7°F | Resp 18 | Ht 62.0 in | Wt 150.6 lb

## 2021-11-17 DIAGNOSIS — C569 Malignant neoplasm of unspecified ovary: Secondary | ICD-10-CM

## 2021-11-17 DIAGNOSIS — R971 Elevated cancer antigen 125 [CA 125]: Secondary | ICD-10-CM | POA: Diagnosis not present

## 2021-11-17 DIAGNOSIS — Z5111 Encounter for antineoplastic chemotherapy: Secondary | ICD-10-CM | POA: Diagnosis not present

## 2021-11-17 LAB — CMP (CANCER CENTER ONLY)
ALT: 19 U/L (ref 0–44)
AST: 17 U/L (ref 15–41)
Albumin: 3.8 g/dL (ref 3.5–5.0)
Alkaline Phosphatase: 54 U/L (ref 38–126)
Anion gap: 8 (ref 5–15)
BUN: 24 mg/dL — ABNORMAL HIGH (ref 8–23)
CO2: 27 mmol/L (ref 22–32)
Calcium: 9.1 mg/dL (ref 8.9–10.3)
Chloride: 104 mmol/L (ref 98–111)
Creatinine: 0.58 mg/dL (ref 0.44–1.00)
GFR, Estimated: >60 mL/min (ref >60)
Glucose, Bld: 91 mg/dL (ref 70–99)
Potassium: 3.8 mmol/L (ref 3.5–5.1)
Sodium: 139 mmol/L (ref 135–145)
Total Bilirubin: 0.3 mg/dL (ref 0.3–1.2)
Total Protein: 6.7 g/dL (ref 6.5–8.1)

## 2021-11-17 LAB — CBC WITH DIFFERENTIAL (CANCER CENTER ONLY)
Abs Immature Granulocytes: 0.06 10*3/uL (ref 0.00–0.07)
Basophils Absolute: 0 10*3/uL (ref 0.0–0.1)
Basophils Relative: 1 %
Eosinophils Absolute: 0.1 10*3/uL (ref 0.0–0.5)
Eosinophils Relative: 2 %
HCT: 36 % (ref 36.0–46.0)
Hemoglobin: 11.7 g/dL — ABNORMAL LOW (ref 12.0–15.0)
Immature Granulocytes: 2 %
Lymphocytes Relative: 30 %
Lymphs Abs: 0.9 10*3/uL (ref 0.7–4.0)
MCH: 33 pg (ref 26.0–34.0)
MCHC: 32.5 g/dL (ref 30.0–36.0)
MCV: 101.4 fL — ABNORMAL HIGH (ref 80.0–100.0)
Monocytes Absolute: 0.3 10*3/uL (ref 0.1–1.0)
Monocytes Relative: 11 %
Neutro Abs: 1.6 10*3/uL — ABNORMAL LOW (ref 1.7–7.7)
Neutrophils Relative %: 54 %
Platelet Count: 275 10*3/uL (ref 150–400)
RBC: 3.55 MIL/uL — ABNORMAL LOW (ref 3.87–5.11)
RDW: 16 % — ABNORMAL HIGH (ref 11.5–15.5)
WBC Count: 3 10*3/uL — ABNORMAL LOW (ref 4.0–10.5)
nRBC: 0 % (ref 0.0–0.2)

## 2021-11-17 MED ORDER — SODIUM CHLORIDE 0.9 % IV SOLN
10.0000 mg | Freq: Once | INTRAVENOUS | Status: AC
  Administered 2021-11-17: 10 mg via INTRAVENOUS
  Filled 2021-11-17: qty 1

## 2021-11-17 MED ORDER — SODIUM CHLORIDE 0.9 % IV SOLN: Freq: Once | INTRAVENOUS | Status: AC

## 2021-11-17 MED ORDER — HEPARIN SOD (PORK) LOCK FLUSH 100 UNIT/ML IV SOLN
500.0000 [IU] | Freq: Once | INTRAVENOUS | Status: AC | PRN
  Administered 2021-11-17: 500 [IU]

## 2021-11-17 MED ORDER — SODIUM CHLORIDE 0.9% FLUSH
10.0000 mL | INTRAVENOUS | Status: DC | PRN
  Administered 2021-11-17: 10 mL

## 2021-11-17 MED ORDER — DIPHENHYDRAMINE HCL 50 MG/ML IJ SOLN
25.0000 mg | Freq: Once | INTRAMUSCULAR | Status: AC
  Administered 2021-11-17: 25 mg via INTRAVENOUS
  Filled 2021-11-17: qty 1

## 2021-11-17 MED ORDER — SODIUM CHLORIDE 0.9 % IV SOLN
159.4000 mg | Freq: Once | INTRAVENOUS | Status: AC
  Administered 2021-11-17: 160 mg via INTRAVENOUS
  Filled 2021-11-17: qty 16

## 2021-11-17 MED ORDER — FAMOTIDINE IN NACL 20-0.9 MG/50ML-% IV SOLN
20.0000 mg | Freq: Once | INTRAVENOUS | Status: AC
  Administered 2021-11-17: 20 mg via INTRAVENOUS
  Filled 2021-11-17: qty 50

## 2021-11-17 MED ORDER — SODIUM CHLORIDE 0.9 % IV SOLN
80.0000 mg/m2 | Freq: Once | INTRAVENOUS | Status: AC
  Administered 2021-11-17: 138 mg via INTRAVENOUS
  Filled 2021-11-17: qty 23

## 2021-11-17 MED ORDER — PALONOSETRON HCL INJECTION 0.25 MG/5ML
0.2500 mg | Freq: Once | INTRAVENOUS | Status: AC
  Administered 2021-11-17: 0.25 mg via INTRAVENOUS
  Filled 2021-11-17: qty 5

## 2021-11-17 NOTE — Patient Instructions (Signed)
[facility]   Discharge Instructions: Thank you for choosing [facility] to provide your oncology and hematology care.   If you have a lab appointment with the [facility], please go directly to the [facility] and check in at the registration area.   Wear comfortable clothing and clothing appropriate for easy access to any Portacath or PICC line.   We strive to give you quality time with your provider. You may need to reschedule your appointment if you arrive late (15 or more minutes).  Arriving late affects you and other patients whose appointments are after yours.  Also, if you miss three or more appointments without notifying the office, you may be dismissed from the clinic at the providers discretion.      For prescription refill requests, have your pharmacy contact our office and allow 72 hours for refills to be completed.    Today you received the following chemotherapy and/or immunotherapy agents Taxol, Carboplatin      To help prevent nausea and vomiting after your treatment, we encourage you to take your nausea medication as directed.  BELOW ARE SYMPTOMS THAT SHOULD BE REPORTED IMMEDIATELY: *FEVER GREATER THAN 100.4 F (38 C) OR HIGHER *CHILLS OR SWEATING *NAUSEA AND VOMITING THAT IS NOT CONTROLLED WITH YOUR NAUSEA MEDICATION *UNUSUAL SHORTNESS OF BREATH *UNUSUAL BRUISING OR BLEEDING *URINARY PROBLEMS (pain or burning when urinating, or frequent urination) *BOWEL PROBLEMS (unusual diarrhea, constipation, pain near the anus) TENDERNESS IN MOUTH AND THROAT WITH OR WITHOUT PRESENCE OF ULCERS (sore throat, sores in mouth, or a toothache) UNUSUAL RASH, SWELLING OR PAIN  UNUSUAL VAGINAL DISCHARGE OR ITCHING   Items with * indicate a potential emergency and should be followed up as soon as possible or go to the Emergency Department if any problems should occur.  Please show the CHEMOTHERAPY ALERT CARD or IMMUNOTHERAPY ALERT CARD at  check-in to the Emergency Department and triage nurse.  Should you have questions after your visit or need to cancel or reschedule your appointment, please contact [facility]  Dept: [phone]  and follow the prompts.  Office hours are 8:00 a.m. to 4:30 p.m. Monday - Friday. Please note that voicemails left after 4:00 p.m. may not be returned until the following business day.  We are closed weekends and major holidays. You have access to a nurse at all times for urgent questions. Please call the main number to the clinic Dept: [phone] and follow the prompts.   For any non-urgent questions, you may also contact your provider using MyChart. We now offer e-Visits for anyone 72 and older to request care online for non-urgent symptoms. For details visit [email or web address].   Also download the MyChart app! Go to the app store, search "MyChart", open the app, select [facility], and log in with your MyChart username and password.  Due to Covid, a mask is required upon entering the hospital/clinic. If you do not have a mask, one will be given to you upon arrival. For doctor visits, patients may have 1 support person aged 72 or older with them. For treatment visits, patients cannot have anyone with them due to current Covid guidelines and our immunocompromised population.   Paclitaxel injection What is this medication? PACLITAXEL (PAK li TAX el) is a chemotherapy drug. It targets fast dividing cells, like cancer cells, and causes these cells to die. This medicine is used to treat ovarian cancer, breast cancer, lung cancer, Kaposi's sarcoma, and other cancers. This  medicine may be used for other purposes; ask your health care provider or pharmacist if you have questions. COMMON BRAND NAME(S): Onxol, Taxol What should I tell my care team before I take this medication? They need to know if you have any of these conditions: history of irregular heartbeat liver  disease low blood counts, like low white cell, platelet, or red cell counts lung or breathing disease, like asthma tingling of the fingers or toes, or other nerve disorder an unusual or allergic reaction to paclitaxel, alcohol, polyoxyethylated castor oil, other chemotherapy, other medicines, foods, dyes, or preservatives pregnant or trying to get pregnant breast-feeding How should I use this medication? This drug is given as an infusion into a vein. It is administered in a hospital or clinic by a specially trained health care professional. Talk to your pediatrician regarding the use of this medicine in children. Special care may be needed. Overdosage: If you think you have taken too much of this medicine contact a poison control center or emergency room at once. NOTE: This medicine is only for you. Do not share this medicine with others. What if I miss a dose? It is important not to miss your dose. Call your doctor or health care professional if you are unable to keep an appointment. What may interact with this medication? Do not take this medicine with any of the following medications: live virus vaccines This medicine may also interact with the following medications: antiviral medicines for hepatitis, HIV or AIDS certain antibiotics like erythromycin and clarithromycin certain medicines for fungal infections like ketoconazole and itraconazole certain medicines for seizures like carbamazepine, phenobarbital, phenytoin gemfibrozil nefazodone rifampin St. John's wort This list may not describe all possible interactions. Give your health care provider a list of all the medicines, herbs, non-prescription drugs, or dietary supplements you use. Also tell them if you smoke, drink alcohol, or use illegal drugs. Some items may interact with your medicine. What should I watch for while using this medication? Your condition will be monitored carefully while you are receiving this medicine. You  will need important blood work done while you are taking this medicine. This medicine can cause serious allergic reactions. To reduce your risk you will need to take other medicine(s) before treatment with this medicine. If you experience allergic reactions like skin rash, itching or hives, swelling of the face, lips, or tongue, tell your doctor or health care professional right away. In some cases, you may be given additional medicines to help with side effects. Follow all directions for their use. This drug may make you feel generally unwell. This is not uncommon, as chemotherapy can affect healthy cells as well as cancer cells. Report any side effects. Continue your course of treatment even though you feel ill unless your doctor tells you to stop. Call your doctor or health care professional for advice if you get a fever, chills or sore throat, or other symptoms of a cold or flu. Do not treat yourself. This drug decreases your body's ability to fight infections. Try to avoid being around people who are sick. This medicine may increase your risk to bruise or bleed. Call your doctor or health care professional if you notice any unusual bleeding. Be careful brushing and flossing your teeth or using a toothpick because you may get an infection or bleed more easily. If you have any dental work done, tell your dentist you are receiving this medicine. Avoid taking products that contain aspirin, acetaminophen, ibuprofen, naproxen, or ketoprofen unless instructed  by your doctor. These medicines may hide a fever. Do not become pregnant while taking this medicine. Women should inform their doctor if they wish to become pregnant or think they might be pregnant. There is a potential for serious side effects to an unborn child. Talk to your health care professional or pharmacist for more information. Do not breast-feed an infant while taking this medicine. Men are advised not to father a child while receiving this  medicine. This product may contain alcohol. Ask your pharmacist or healthcare provider if this medicine contains alcohol. Be sure to tell all healthcare providers you are taking this medicine. Certain medicines, like metronidazole and disulfiram, can cause an unpleasant reaction when taken with alcohol. The reaction includes flushing, headache, nausea, vomiting, sweating, and increased thirst. The reaction can last from 30 minutes to several hours. What side effects may I notice from receiving this medication? Side effects that you should report to your doctor or health care professional as soon as possible: allergic reactions like skin rash, itching or hives, swelling of the face, lips, or tongue breathing problems changes in vision fast, irregular heartbeat high or low blood pressure mouth sores pain, tingling, numbness in the hands or feet signs of decreased platelets or bleeding - bruising, pinpoint red spots on the skin, black, tarry stools, blood in the urine signs of decreased red blood cells - unusually weak or tired, feeling faint or lightheaded, falls signs of infection - fever or chills, cough, sore throat, pain or difficulty passing urine signs and symptoms of liver injury like dark yellow or brown urine; general ill feeling or flu-like symptoms; light-colored stools; loss of appetite; nausea; right upper belly pain; unusually weak or tired; yellowing of the eyes or skin swelling of the ankles, feet, hands unusually slow heartbeat Side effects that usually do not require medical attention (report to your doctor or health care professional if they continue or are bothersome): diarrhea hair loss loss of appetite muscle or joint pain nausea, vomiting pain, redness, or irritation at site where injected tiredness This list may not describe all possible side effects. Call your doctor for medical advice about side effects. You may report side effects to FDA at 1-800-FDA-1088. Where  should I keep my medication? This drug is given in a hospital or clinic and will not be stored at home. NOTE: This sheet is a summary. It may not cover all possible information. If you have questions about this medicine, talk to your doctor, pharmacist, or health care provider.  2022 Elsevier/Gold Standard (2021-06-09 00:00:00)  Carboplatin injection What is this medication? CARBOPLATIN (KAR boe pla tin) is a chemotherapy drug. It targets fast dividing cells, like cancer cells, and causes these cells to die. This medicine is used to treat ovarian cancer and many other cancers. This medicine may be used for other purposes; ask your health care provider or pharmacist if you have questions. COMMON BRAND NAME(S): Paraplatin What should I tell my care team before I take this medication? They need to know if you have any of these conditions: blood disorders hearing problems kidney disease recent or ongoing radiation therapy an unusual or allergic reaction to carboplatin, cisplatin, other chemotherapy, other medicines, foods, dyes, or preservatives pregnant or trying to get pregnant breast-feeding How should I use this medication? This drug is usually given as an infusion into a vein. It is administered in a hospital or clinic by a specially trained health care professional. Talk to your pediatrician regarding the use of this medicine  in children. Special care may be needed. Overdosage: If you think you have taken too much of this medicine contact a poison control center or emergency room at once. NOTE: This medicine is only for you. Do not share this medicine with others. What if I miss a dose? It is important not to miss a dose. Call your doctor or health care professional if you are unable to keep an appointment. What may interact with this medication? medicines for seizures medicines to increase blood counts like filgrastim, pegfilgrastim, sargramostim some antibiotics like amikacin,  gentamicin, neomycin, streptomycin, tobramycin vaccines Talk to your doctor or health care professional before taking any of these medicines: acetaminophen aspirin ibuprofen ketoprofen naproxen This list may not describe all possible interactions. Give your health care provider a list of all the medicines, herbs, non-prescription drugs, or dietary supplements you use. Also tell them if you smoke, drink alcohol, or use illegal drugs. Some items may interact with your medicine. What should I watch for while using this medication? Your condition will be monitored carefully while you are receiving this medicine. You will need important blood work done while you are taking this medicine. This drug may make you feel generally unwell. This is not uncommon, as chemotherapy can affect healthy cells as well as cancer cells. Report any side effects. Continue your course of treatment even though you feel ill unless your doctor tells you to stop. In some cases, you may be given additional medicines to help with side effects. Follow all directions for their use. Call your doctor or health care professional for advice if you get a fever, chills or sore throat, or other symptoms of a cold or flu. Do not treat yourself. This drug decreases your body's ability to fight infections. Try to avoid being around people who are sick. This medicine may increase your risk to bruise or bleed. Call your doctor or health care professional if you notice any unusual bleeding. Be careful brushing and flossing your teeth or using a toothpick because you may get an infection or bleed more easily. If you have any dental work done, tell your dentist you are receiving this medicine. Avoid taking products that contain aspirin, acetaminophen, ibuprofen, naproxen, or ketoprofen unless instructed by your doctor. These medicines may hide a fever. Do not become pregnant while taking this medicine. Women should inform their doctor if they wish  to become pregnant or think they might be pregnant. There is a potential for serious side effects to an unborn child. Talk to your health care professional or pharmacist for more information. Do not breast-feed an infant while taking this medicine. What side effects may I notice from receiving this medication? Side effects that you should report to your doctor or health care professional as soon as possible: allergic reactions like skin rash, itching or hives, swelling of the face, lips, or tongue signs of infection - fever or chills, cough, sore throat, pain or difficulty passing urine signs of decreased platelets or bleeding - bruising, pinpoint red spots on the skin, black, tarry stools, nosebleeds signs of decreased red blood cells - unusually weak or tired, fainting spells, lightheadedness breathing problems changes in hearing changes in vision chest pain high blood pressure low blood counts - This drug may decrease the number of white blood cells, red blood cells and platelets. You may be at increased risk for infections and bleeding. nausea and vomiting pain, swelling, redness or irritation at the injection site pain, tingling, numbness in the hands or  feet problems with balance, talking, walking trouble passing urine or change in the amount of urine Side effects that usually do not require medical attention (report to your doctor or health care professional if they continue or are bothersome): hair loss loss of appetite metallic taste in the mouth or changes in taste This list may not describe all possible side effects. Call your doctor for medical advice about side effects. You may report side effects to FDA at 1-800-FDA-1088. Where should I keep my medication? This drug is given in a hospital or clinic and will not be stored at home. NOTE: This sheet is a summary. It may not cover all possible information. If you have questions about this medicine, talk to your doctor, pharmacist,  or health care provider.  2022 Elsevier/Gold Standard (2008-02-28 00:00:00)

## 2021-11-17 NOTE — Progress Notes (Signed)
[facility]  Initial Assessment   [name] [name] is a 70 y.o. year old female seen in infusion . Clinical Social Work was referred by Engineer, site for assessment of psychosocial needs.   SDOH (Social Determinants of Health) assessments performed: Yes SDOH Interventions    Flowsheet Row Most Recent Value  SDOH Interventions   Food Insecurity Interventions Intervention Not Indicated  Financial Strain Interventions Intervention Not Indicated  Housing Interventions Intervention Not Indicated  Intimate Partner Violence Interventions Intervention Not Indicated  Stress Interventions Provide Counseling  Social Connections Interventions Intervention Not Indicated  Transportation Interventions Intervention Not Indicated       Distress Screen completed: No No flowsheet data found.    Family/Social Information:  Housing Arrangement: patient lives with spouse Ashiyah, [place] (Spouse) [phone]   Family members/support persons in your life? Patient stated she lacks consistent support. Transportation concerns: no, but patient does not have additional supports if she is unable to drive herself to cancer center.  CSW recommended patient update CSW, Engineer, site, Infusion RNs or front desk staff if she has issues with transportation.  Employment: Retired. Income source: Paediatric nurse concerns: No Type of concern: None Food access concerns: no Religious or spiritual practice: yes Services Currently in place:  [employer] Medicare  Coping/ Adjustment to diagnosis: Patient understands treatment plan and what happens next? yes Concerns about diagnosis and/or treatment: I'm not especially worried about anything Patient reported stressors: Depression, Adjusting to my illness, Isolation/ feeling alone, and lack of consistent support Hopes and priorities: N/A Patient enjoys watching TV Current coping skills/ strengths: Average or above average intelligence , Capable  of independent living , Communication skills , and Financial means     SUMMARY: Current SDOH Barriers:  Financial constraints related to fixed income, Limited social support, and Social Isolation  Clinical Social Work Clinical Goal(s):  Patient will update CSW if she has any concerns or questions.  Interventions: Discussed common feeling and emotions when being diagnosed with cancer, and the importance of support during treatment Informed patient of the support team roles and support services at [facility] Provided CSW contact information and encouraged patient to call with any questions or concerns Provided patient with information about CSW role in patient care and resources.   Follow Up Plan: Patient will contact CSW with any support or resource needs Patient verbalizes understanding of plan: Yes    Cauy Melody , LCSW

## 2021-11-17 NOTE — Progress Notes (Signed)
Patient presents for treatment. RN assessment completed along with the following:  Labs/vitals reviewed - Yes, and within treatment parameters.   Weight within 10% of previous measurement - Yes Informed consent completed and reflects current therapy/intent - Yes, on date 09/06/21             Provider progress note reviewed - Patient not seen by provider today. Most recent note dated 11/03/21 reviewed. Treatment/Antibody/Supportive plan reviewed - Yes, and there are no adjustments needed for today's treatment. S&H and other orders reviewed - Yes, and there are no additional orders identified. Previous treatment date reviewed - Yes, and the appropriate amount of time has elapsed between treatments. Clinic Hand Off Received from - none  Patient to proceed with treatment.

## 2021-11-17 NOTE — Progress Notes (Signed)
Referral placed for Social Work to follow

## 2021-11-18 LAB — CA 125: Cancer Antigen (CA) 125: 23.6 U/mL (ref 0.0–38.1)

## 2021-11-23 ENCOUNTER — Other Ambulatory Visit: Payer: Self-pay

## 2021-11-23 ENCOUNTER — Ambulatory Visit (HOSPITAL_BASED_OUTPATIENT_CLINIC_OR_DEPARTMENT_OTHER)
Admission: RE | Admit: 2021-11-23 | Discharge: 2021-11-23 | Disposition: A | Discharge status: Home / Self Care | Payer: Medicare Other | Source: Ambulatory Visit | Attending: Nurse Practitioner | Admitting: Nurse Practitioner

## 2021-11-23 ENCOUNTER — Encounter (HOSPITAL_BASED_OUTPATIENT_CLINIC_OR_DEPARTMENT_OTHER): Payer: Self-pay

## 2021-11-23 DIAGNOSIS — I700 Atherosclerosis of aorta: Secondary | ICD-10-CM | POA: Insufficient documentation

## 2021-11-23 DIAGNOSIS — C786 Secondary malignant neoplasm of retroperitoneum and peritoneum: Secondary | ICD-10-CM | POA: Diagnosis not present

## 2021-11-23 DIAGNOSIS — C569 Malignant neoplasm of unspecified ovary: Secondary | ICD-10-CM | POA: Insufficient documentation

## 2021-11-23 MED ORDER — IOHEXOL 300 MG/ML  SOLN
85.0000 mL | Freq: Once | INTRAMUSCULAR | Status: AC | PRN
  Administered 2021-11-23: 85 mL via INTRAVENOUS

## 2021-11-25 ENCOUNTER — Telehealth: Payer: Self-pay

## 2021-11-25 NOTE — Telephone Encounter (Signed)
-----   Message from Owens Shark, NP sent at 11/25/2021  3:52 PM EST ----- Please let her know the CT scan showed new inflammatory changes at the lung bases.  Does she have a fever, cough, dyspnea?  The cancer looks better.

## 2021-11-25 NOTE — Telephone Encounter (Signed)
I called and spoke with the patient to let her know the Ct Scan showed new inflammatory changes at the lung bases. She does have a cough no fever, no dyspnea. Patient will contact her pcp for the cough. Patient voiced understanding of instruction and had no further questions or concerns at this time.

## 2021-11-26 ENCOUNTER — Telehealth: Payer: Self-pay

## 2021-11-26 NOTE — Telephone Encounter (Signed)
Spoke with patient about the abdominal nodules appear improved consistent with a response to the chemotherapy. There are inflammatory appearing at the lung base. Patient stated she have a cough. Advice the patient to f/u with her primary provider for antibiotics. Patient voiced understanding

## 2021-11-26 NOTE — Telephone Encounter (Signed)
-----   Message from Ladell Pier, MD sent at 11/25/2021  5:57 PM EST ----- Please call patient, the abdominal nodules appear improved consistent with a response to the chemotherapy.  There are inflammatory appearing densities at the lung bases.  If she has symptoms of pneumonia (cough, fever, shortness of breath) she should follow-up with her primary provider for antibiotics.

## 2021-11-27 ENCOUNTER — Other Ambulatory Visit: Payer: Self-pay

## 2021-11-27 ENCOUNTER — Encounter: Payer: Self-pay | Admitting: Nurse Practitioner

## 2021-11-27 ENCOUNTER — Ambulatory Visit (INDEPENDENT_AMBULATORY_CARE_PROVIDER_SITE_OTHER): Payer: Medicare Other | Admitting: Nurse Practitioner

## 2021-11-27 VITALS — BP 136/88 | HR 80 | Temp 96.6°F | Resp 14 | Ht 62.0 in | Wt 149.4 lb

## 2021-11-27 DIAGNOSIS — N3090 Cystitis, unspecified without hematuria: Secondary | ICD-10-CM

## 2021-11-27 DIAGNOSIS — H6123 Impacted cerumen, bilateral: Secondary | ICD-10-CM | POA: Insufficient documentation

## 2021-11-27 DIAGNOSIS — H6122 Impacted cerumen, left ear: Secondary | ICD-10-CM | POA: Insufficient documentation

## 2021-11-27 DIAGNOSIS — R350 Frequency of micturition: Secondary | ICD-10-CM

## 2021-11-27 DIAGNOSIS — J22 Unspecified acute lower respiratory infection: Secondary | ICD-10-CM | POA: Diagnosis not present

## 2021-11-27 LAB — POCT URINALYSIS DIPSTICK
Bilirubin, UA: NEGATIVE
Blood, UA: NEGATIVE
Glucose, UA: NEGATIVE
Ketones, UA: NEGATIVE
Nitrite, UA: NEGATIVE
Protein, UA: NEGATIVE
Spec Grav, UA: 1.02 (ref 1.010–1.025)
Urobilinogen, UA: 0.2 E.U./dL
pH, UA: 6 (ref 5.0–8.0)

## 2021-11-27 MED ORDER — AMOXICILLIN-POT CLAVULANATE 875-125 MG PO TABS: 1.0000 | ORAL_TABLET | Freq: Two times a day (BID) | ORAL | 0 refills | Status: AC

## 2021-11-27 NOTE — Assessment & Plan Note (Signed)
Given signs, symptoms, length of illness, patient comorbidities, and physical exam will like to treat for lower respiratory infection.  Discussed this with patient.  Did defer chest x-ray in office.  We will start Augmentin 875-125 mg twice daily for 7 days this will be dual purpose for urinary tract infection or respiratory infection.  Follow-up if no improvements signs and symptoms reviewed when to seek urgent or emergent health care.

## 2021-11-27 NOTE — Assessment & Plan Note (Signed)
Urinary frequency and odor per patient report.  UA indicative of cystitis.

## 2021-11-27 NOTE — Patient Instructions (Signed)
Nice to see you today I sent in medication to cover both the respiratory and the urinary infections Let me know if you do not start improving

## 2021-11-27 NOTE — Assessment & Plan Note (Addendum)
Per UA.  Pending urinary culture treat with Augmentin 875-125 mg twice daily for 7 days.

## 2021-11-27 NOTE — Progress Notes (Signed)
Acute Office Visit  Subjective:    Patient ID: [name] [name], female    DOB: [birth year], 71 y.o.   MRN: [id number]  Chief Complaint  Patient presents with   Cough    Sx started about 2 weeks ago-cough, chest hurts from coughing, sore throat, sometimes headache and sinus pressure, runny nose. No fever. Covid test not done.   abnormal urine odor    And urinary frequency/urgency     Patient is in today for Cough and urinary complaints  Cough: started for approx 2.5 weeks States that she does help with dad and he is in the hospital for presumed pna No covid vaccines No covid test Has been using robitussien with some relief. States she saw or talked to her oncologist and they suggested that she may have an infection and to be evaluated by PCP   Abnormal urine odor: states that she pees a lot but that is her normal. States that the urine has an odor to it.  Past Medical History:  Diagnosis Date   Anxiety and depression     DJD (degenerative joint disease)    Elevated cholesterol    Endometrial polyp    Family history of bladder cancer    Family history of lymphoma    History of radiation therapy 10/25/16-12/01/16   right pelvis/60.2 Gy in 28 fractions   IRRITABLE BOWEL SYNDROME, HX OF 05/15/2008   Kidney stone    MVP (mitral valve prolapse)    occ palpitations    Ovarian ca ([place]) 07/2012       Rheumatoid arthritis(714.0) ~ 2010   dr Ouida Sills   Shingles     Past Surgical History:  Procedure Laterality Date   ABDOMINAL HYSTERECTOMY  08/22/2012   Procedure: HYSTERECTOMY ABDOMINAL;  Surgeon: Alvino Chapel, MD;  Location: [facility];  Service: Gynecology;  Laterality: N/A;   BACK SURGERY  10/04/2006   Dr Trenton Gammon   COLONOSCOPY  04/20/2004   COLONOSCOPY W/ BIOPSIES  07/04/2014   COLOSTOMY REVISION  08/22/2012   Procedure: COLON RESECTION SIGMOID;  Surgeon: Alvino Chapel, MD;  Location: [facility];  Service: Gynecology;;  Rectal Sigmoid resection and low rectal  anastomosis   CYST ON NECK  10/04/2009   DEBULKING  08/22/2012   Procedure: DEBULKING;  Surgeon: Alvino Chapel, MD;  Location: [facility];  Service: Gynecology;  Laterality: N/A;  Radical tumor debulking, Bilateral Ureterolysis   DILATION AND CURETTAGE OF UTERUS  10/04/2002   WITH HYSTEROSCOPY   HAND SURGERY  10/04/1994   HYSTEROSCOPY  10/04/2002   D&C   IR IMAGING GUIDED PORT INSERTION  09/01/2021   KIDNEY STONE SURGERY Right    with stent placement  Mar 03 2021   NECK SURGERY  10/05/2007   SPURS   OMENTECTOMY  08/22/2012   Procedure: OMENTECTOMY;  Surgeon: Alvino Chapel, MD;  Location: [facility];  Service: Gynecology;  Laterality: N/A;   SALPINGOOPHORECTOMY  08/22/2012   Procedure: SALPINGO OOPHORECTOMY;  Surgeon: Alvino Chapel, MD;  Location: [facility];  Service: Gynecology;  Laterality: Bilateral;   TUBAL LIGATION  10/04/1978    Family History  Problem Relation Age of Onset   Heart disease Mother    Bladder Cancer Father 59   Lymphoma Paternal Aunt    Osteoporosis Sister    Colon cancer Neg Hx    Rectal cancer Neg Hx    Stomach cancer Neg Hx    Diabetes Neg Hx    Stroke Neg Hx  Social History   Socioeconomic History   Marital status: Married    Spouse name: Not on file   Number of children: 2   Years of education: Not on file   Highest education level: Not on file  Occupational History   Occupation: disability d/t RA  Tobacco Use   Smoking status: Never   Smokeless tobacco: Never  Vaping Use   Vaping Use: Never used  Substance and Sexual Activity   Alcohol use: No   Drug use: No   Sexual activity: Never    Birth control/protection: Post-menopausal, Surgical  Other Topics Concern   Not on file  Social History Narrative   Anne Ng Beulah Gandy is her daughter    Household-- pr and husband   Social Determinants of Radio broadcast assistant Strain: Low Risk    Difficulty of Paying Living Expenses: Not hard at all  Food Insecurity:  No Food Insecurity   Worried About Charity fundraiser in the Last Year: Never true   Arboriculturist in the Last Year: Never true  Transportation Needs: No Transportation Needs   Lack of Transportation (Medical): No   Lack of Transportation (Non-Medical): No  Physical Activity: Insufficiently Active   Days of Exercise per Week: 1 day   Minutes of Exercise per Session: 30 min  Stress: Stress Concern Present   Feeling of Stress : To some extent  Social Connections: Moderately Isolated   Frequency of Communication with Friends and Family: Once a week   Frequency of Social Gatherings with Friends and Family: Once a week   Attends Religious Services: Never   Marine scientist or Organizations: Yes   Attends Archivist Meetings: Never   Marital Status: Married  Human resources officer Violence: Not At Risk   Fear of Current or Ex-Partner: No   Emotionally Abused: No   Physically Abused: No   Sexually Abused: No    Outpatient Medications Prior to Visit  Medication Sig Dispense Refill   fish oil-omega-3 fatty acids 1000 MG capsule Take 1 g by mouth daily.     lidocaine-prilocaine (EMLA) cream Apply 1 application topically as directed. Apply 1/2 tablespoon to port site and cover with Press-and-Seal 2 hours prior to IV stick to numb site. Begin use 14 days after port is inserted. 30 g 1   Multiple Vitamin (MULTIVITAMIN WITH MINERALS) TABS Take 1 tablet by mouth daily.     ondansetron (ZOFRAN) 8 MG tablet Take 1 tablet (8 mg total) by mouth every 8 (eight) hours as needed for nausea or vomiting. 30 tablet 1   polyethylene glycol (MIRALAX) 17 g packet Take 17 g by mouth daily as needed. 14 each 2   prochlorperazine (COMPAZINE) 10 MG tablet Take 1 tablet (10 mg total) by mouth every 6 (six) hours as needed for nausea. 60 tablet 1   Red Yeast Rice Extract 600 MG CAPS Take 1 capsule by mouth daily.     fluticasone (FLONASE) 50 MCG/ACT nasal spray Place 2 sprays into both nostrils daily.  (Patient not taking: Reported on 11/03/2021) 16 g 1   No facility-administered medications prior to visit.    Allergies  Allergen Reactions   Atorvastatin Other (See Comments)    Leg pain   Macrodantin Other (See Comments)    Unknown Reaction   Decongest-Aid [Pseudoephedrine] Palpitations    Review of Systems  Constitutional:  Positive for fatigue. Negative for appetite change, chills and fever.  HENT:  Positive for rhinorrhea and  sore throat. Negative for congestion, ear discharge, ear pain, sinus pressure and sinus pain.   Respiratory:  Positive for cough. Negative for shortness of breath (DOE and with coughing).   Cardiovascular:  Positive for chest pain (sore from coughin).  Gastrointestinal:  Negative for diarrhea, nausea and vomiting.  Genitourinary:        Nocturia   Musculoskeletal:  Positive for back pain (baseline from back surgery). Negative for arthralgias and myalgias.  Neurological:  Negative for headaches.      Objective:    Physical Exam Constitutional:      Appearance: Normal appearance.  HENT:     Right Ear: Tympanic membrane, ear canal and external ear normal. There is no impacted cerumen.     Left Ear: Ear canal and external ear normal. There is impacted cerumen.  Cardiovascular:     Rate and Rhythm: Normal rate and regular rhythm.  Pulmonary:     Effort: Pulmonary effort is normal.     Breath sounds: Normal breath sounds.     Comments: Wheeze and rhonchi that cleared with her cough  Abdominal:     General: Bowel sounds are normal. There is no distension.     Palpations: There is no mass.     Tenderness: There is abdominal tenderness. There is no right CVA tenderness or left CVA tenderness.     Hernia: No hernia is present.       Comments: Baseline tenderness per patient report  Lymphadenopathy:     Cervical: Cervical adenopathy present.  Neurological:     Mental Status: She is alert.    BP 136/88    Pulse 80    Temp (!) 96.6 F (35.9 C)     Resp 14    Ht _0  (1.575 m)    Wt 149 lb 7 oz (67.8 kg)    SpO2 97%    BMI 27.33 kg/m  Wt Readings from Last 3 Encounters:  11/27/21 149 lb 7 oz (67.8 kg)  11/17/21 150 lb 9.6 oz (68.3 kg)  11/10/21 151 lb (68.5 kg)    Health Maintenance Due  Topic Date Due   Zoster Vaccines- Shingrix (1 of 2) Never done   COLONOSCOPY (Pts 45-71yr Insurance coverage will need to be confirmed)  07/24/2019   Pneumonia Vaccine 72 Years old (3) 10/19/2019    There are no preventive care reminders to display for this patient.   Lab Results  Component Value Date   TSH 1.77 02/11/2021   Lab Results  Component Value Date   WBC 3.0 (L) 11/17/2021   HGB 11.7 (L) 11/17/2021   HCT 36.0 11/17/2021   MCV 101.4 (H) 11/17/2021   PLT 275 11/17/2021   Lab Results  Component Value Date   NA 139 11/17/2021   K 3.8 11/17/2021   CHLORIDE 105 09/20/2017   CO2 27 11/17/2021   GLUCOSE 91 11/17/2021   BUN 24 (H) 11/17/2021   CREATININE 0.58 11/17/2021   BILITOT 0.3 11/17/2021   ALKPHOS 54 11/17/2021   AST 17 11/17/2021   ALT 19 11/17/2021   PROT 6.7 11/17/2021   ALBUMIN 3.8 11/17/2021   CALCIUM 9.1 11/17/2021   ANIONGAP 8 11/17/2021   EGFR >60 09/20/2017   GFR 82.85 02/11/2021   Lab Results  Component Value Date   CHOL 224 (H) 07/07/2021   Lab Results  Component Value Date   HDL 50.90 07/07/2021   Lab Results  Component Value Date   LDLCALC 140 (H) 07/07/2021   Lab Results  Component Value Date   TRIG 169.0 (H) 07/07/2021   Lab Results  Component Value Date   CHOLHDL 4 07/07/2021   No results found for: HGBA1C     Assessment & Plan:   Problem List Items Addressed This Visit       Respiratory   Lower respiratory infection    Given signs, symptoms, length of illness, patient comorbidities, and physical exam will like to treat for lower respiratory infection.  Discussed this with patient.  Did defer chest x-ray in office.  We will start Augmentin 875-125 mg twice daily for 7  days this will be dual purpose for urinary tract infection or respiratory infection.  Follow-up if no improvements signs and symptoms reviewed when to seek urgent or emergent health care.      Relevant Medications   amoxicillin-clavulanate (AUGMENTIN) 875-125 MG tablet     Nervous and Auditory   Impacted cerumen of left ear   Relevant Orders   Ear Lavage     Genitourinary   Cystitis    Per UA.  Pending urinary culture treat with Augmentin 875-125 mg twice daily for 7 days.      Relevant Medications   amoxicillin-clavulanate (AUGMENTIN) 875-125 MG tablet     Other   Urinary frequency - Primary    Urinary frequency and odor per patient report.  UA indicative of cystitis.      Relevant Orders   POCT urinalysis dipstick (Completed)   Urine Culture     Meds ordered this encounter  Medications   amoxicillin-clavulanate (AUGMENTIN) 875-125 MG tablet    Sig: Take 1 tablet by mouth 2 (two) times daily for 7 days.    Dispense:  14 tablet    Refill:  0    Order Specific Question:   Supervising Provider    Answer:   Loura Pardon A [[id number]]   This visit occurred during the SARS-CoV-2 public health emergency.  Safety protocols were in place, including screening questions prior to the visit, additional usage of staff PPE, and extensive cleaning of exam room while observing appropriate contact time as indicated for disinfecting solutions.    Romilda Garret, NP

## 2021-11-28 ENCOUNTER — Other Ambulatory Visit: Payer: Self-pay | Admitting: Oncology

## 2021-11-29 LAB — URINE CULTURE
MICRO NUMBER:: 13054616
SPECIMEN QUALITY:: ADEQUATE

## 2021-12-01 ENCOUNTER — Encounter: Payer: Self-pay | Admitting: *Deleted

## 2021-12-01 ENCOUNTER — Inpatient Hospital Stay: Payer: Medicare Other | Admitting: Oncology

## 2021-12-01 ENCOUNTER — Inpatient Hospital Stay: Payer: Medicare Other

## 2021-12-01 ENCOUNTER — Other Ambulatory Visit: Payer: Self-pay

## 2021-12-01 VITALS — BP 129/76 | HR 89 | Temp 98.2°F | Resp 18 | Ht 62.0 in | Wt 151.4 lb

## 2021-12-01 DIAGNOSIS — C569 Malignant neoplasm of unspecified ovary: Secondary | ICD-10-CM

## 2021-12-01 DIAGNOSIS — R971 Elevated cancer antigen 125 [CA 125]: Secondary | ICD-10-CM | POA: Diagnosis not present

## 2021-12-01 DIAGNOSIS — Z5111 Encounter for antineoplastic chemotherapy: Secondary | ICD-10-CM | POA: Diagnosis not present

## 2021-12-01 LAB — CBC WITH DIFFERENTIAL (CANCER CENTER ONLY)
Abs Immature Granulocytes: 0.04 10*3/uL (ref 0.00–0.07)
Basophils Absolute: 0 10*3/uL (ref 0.0–0.1)
Basophils Relative: 0 %
Eosinophils Absolute: 0.1 10*3/uL (ref 0.0–0.5)
Eosinophils Relative: 2 %
HCT: 36.9 % (ref 36.0–46.0)
Hemoglobin: 11.8 g/dL — ABNORMAL LOW (ref 12.0–15.0)
Immature Granulocytes: 1 %
Lymphocytes Relative: 13 %
Lymphs Abs: 0.7 10*3/uL (ref 0.7–4.0)
MCH: 33.2 pg (ref 26.0–34.0)
MCHC: 32 g/dL (ref 30.0–36.0)
MCV: 103.9 fL — ABNORMAL HIGH (ref 80.0–100.0)
Monocytes Absolute: 0.9 10*3/uL (ref 0.1–1.0)
Monocytes Relative: 17 %
Neutro Abs: 3.9 10*3/uL (ref 1.7–7.7)
Neutrophils Relative %: 67 %
Platelet Count: 287 10*3/uL (ref 150–400)
RBC: 3.55 MIL/uL — ABNORMAL LOW (ref 3.87–5.11)
RDW: 16.9 % — ABNORMAL HIGH (ref 11.5–15.5)
WBC Count: 5.7 10*3/uL (ref 4.0–10.5)
nRBC: 0 % (ref 0.0–0.2)

## 2021-12-01 LAB — CMP (CANCER CENTER ONLY)
ALT: 17 U/L (ref 0–44)
AST: 17 U/L (ref 15–41)
Albumin: 4 g/dL (ref 3.5–5.0)
Alkaline Phosphatase: 53 U/L (ref 38–126)
Anion gap: 9 (ref 5–15)
BUN: 19 mg/dL (ref 8–23)
CO2: 26 mmol/L (ref 22–32)
Calcium: 9.1 mg/dL (ref 8.9–10.3)
Chloride: 103 mmol/L (ref 98–111)
Creatinine: 0.59 mg/dL (ref 0.44–1.00)
GFR, Estimated: >60 mL/min (ref >60)
Glucose, Bld: 94 mg/dL (ref 70–99)
Potassium: 4.2 mmol/L (ref 3.5–5.1)
Sodium: 138 mmol/L (ref 135–145)
Total Bilirubin: 0.4 mg/dL (ref 0.3–1.2)
Total Protein: 6.4 g/dL — ABNORMAL LOW (ref 6.5–8.1)

## 2021-12-01 MED ORDER — SODIUM CHLORIDE 0.9 % IV SOLN
157.8000 mg | Freq: Once | INTRAVENOUS | Status: AC
  Administered 2021-12-01: 160 mg via INTRAVENOUS
  Filled 2021-12-01: qty 16

## 2021-12-01 MED ORDER — DIPHENHYDRAMINE HCL 50 MG/ML IJ SOLN
25.0000 mg | Freq: Once | INTRAMUSCULAR | Status: AC
  Administered 2021-12-01: 25 mg via INTRAVENOUS
  Filled 2021-12-01: qty 1

## 2021-12-01 MED ORDER — SODIUM CHLORIDE 0.9 % IV SOLN
10.0000 mg | Freq: Once | INTRAVENOUS | Status: AC
  Administered 2021-12-01: 10 mg via INTRAVENOUS
  Filled 2021-12-01: qty 1

## 2021-12-01 MED ORDER — HEPARIN SOD (PORK) LOCK FLUSH 100 UNIT/ML IV SOLN
500.0000 [IU] | Freq: Once | INTRAVENOUS | Status: AC | PRN
  Administered 2021-12-01: 500 [IU]

## 2021-12-01 MED ORDER — PALONOSETRON HCL INJECTION 0.25 MG/5ML
0.2500 mg | Freq: Once | INTRAVENOUS | Status: AC
  Administered 2021-12-01: 0.25 mg via INTRAVENOUS
  Filled 2021-12-01: qty 5

## 2021-12-01 MED ORDER — SODIUM CHLORIDE 0.9 % IV SOLN
80.0000 mg/m2 | Freq: Once | INTRAVENOUS | Status: AC
  Administered 2021-12-01: 138 mg via INTRAVENOUS
  Filled 2021-12-01: qty 23

## 2021-12-01 MED ORDER — FAMOTIDINE IN NACL 20-0.9 MG/50ML-% IV SOLN
20.0000 mg | Freq: Once | INTRAVENOUS | Status: AC
  Administered 2021-12-01: 20 mg via INTRAVENOUS
  Filled 2021-12-01: qty 50

## 2021-12-01 MED ORDER — SODIUM CHLORIDE 0.9 % IV SOLN: Freq: Once | INTRAVENOUS | Status: AC

## 2021-12-01 MED ORDER — SODIUM CHLORIDE 0.9% FLUSH
10.0000 mL | INTRAVENOUS | Status: DC | PRN
  Administered 2021-12-01: 10 mL

## 2021-12-01 NOTE — Progress Notes (Signed)
Patient seen by Dr. Sherrill today ? ?Vitals are within treatment parameters. ? ?Labs reviewed by Dr. Sherrill and are within treatment parameters. ? ?Per physician team, patient is ready for treatment and there are NO modifications to the treatment plan.  ?

## 2021-12-01 NOTE — Patient Instructions (Signed)

## 2021-12-01 NOTE — Progress Notes (Signed)
Patient presents for treatment. RN assessment completed along with the following:  Labs/vitals reviewed - Yes, and within treatment parameters.   Weight within 10% of previous measurement - Yes Informed consent completed and reflects current therapy/intent - Yes, on date 09/09/21             Provider progress note reviewed - Yes, today's provider note was reviewed. Treatment/Antibody/Supportive plan reviewed - Yes, and there are no adjustments needed for today's treatment. S&H and other orders reviewed - Yes, and there are no additional orders identified. Previous treatment date reviewed - Yes, and the appropriate amount of time has elapsed between treatments. Clinic Hand Off Received from - Merceda Elks, RN  Patient to proceed with treatment.

## 2021-12-01 NOTE — Progress Notes (Signed)
[facility] OFFICE PROGRESS NOTE   Diagnosis: Ovarian cancer  INTERVAL HISTORY:   [name] [name] completed another cycle of Taxol/carboplatin 11/17/2021.  She reports malaise and mild nausea for a few days following chemotherapy.  No emesis.  No neuropathy symptoms.  She continues to have mild "soreness "in the right lower abdomen.   She underwent a restaging CT 11/23/2021.  There was evidence of airspace disease at the lung bases.  She saw her primary provider and was prescribed a course of Augmentin.  She reports  her cough resolved. Objective:  Vital signs in last 24 hours:  Blood pressure 129/76, pulse 89, temperature 98.2 F (36.8 C), temperature source Oral, resp. rate 18, height _0  (1.575 m), weight 151 lb 6.4 oz (68.7 kg), SpO2 100 %.    HEENT: No thrush or ulcers Resp: Lungs clear bilaterally Cardio: Regular rate and rhythm GI: No hepatosplenomegaly, no apparent ascites, no mass Vascular: No leg edema    Portacath/PICC-without erythema  Lab Results:  Lab Results  Component Value Date   WBC 5.7 12/01/2021   HGB 11.8 (L) 12/01/2021   HCT 36.9 12/01/2021   MCV 103.9 (H) 12/01/2021   PLT 287 12/01/2021   NEUTROABS 3.9 12/01/2021    CMP  Lab Results  Component Value Date   NA 138 12/01/2021   K 4.2 12/01/2021   CL 103 12/01/2021   CO2 26 12/01/2021   GLUCOSE 94 12/01/2021   BUN 19 12/01/2021   CREATININE 0.59 12/01/2021   CALCIUM 9.1 12/01/2021   PROT 6.4 (L) 12/01/2021   ALBUMIN 4.0 12/01/2021   AST 17 12/01/2021   ALT 17 12/01/2021   ALKPHOS 53 12/01/2021   BILITOT 0.4 12/01/2021   GFRNONAA >60 12/01/2021   GFRAA >60 03/13/2020    Lab Results  Component Value Date   CEA 0.8 07/13/2012   CA125 8 08/18/2016     Medications: I have reviewed the patient's current medications.   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 A 5 mm nodules remain on the right diaphragm. - TumorNext paired germline/tumor analyses: No somatic variants detected, germline CHEK2      VUS      Cycle 1 of adjuvant Taxol/carboplatin chemotherapy initiated on 09/19/2012.   The CA 125 normalized.   She completed day 15 of cycle 6 on 02/06/2013.   Restaging CT evaluation 03/29/2013 showed no evidence of metastatic disease in the chest. There was marked improvement in appearance/resolution of previous described peritoneal/omental disease. There was no convincing evidence of residual disease. There was minimal increased density in the region of the omentum favored to be treatment-related. There was no pelvic adenopathy. CA125 3.2 on 02/19/2014. 08/18/2016 CA-125 8 CT abdomen/pelvis 08/25/2016-solitary new enlarged right external iliac lymph node measuring 2.2 cm. Biopsy 09/07/2016-adenocarcinoma consistent with high-grade serous carcinoma. PET scan 09/22/2016-malignant range FDG uptake associated with the enlarged right external iliac lymph node compatible with metastatic adenopathy.  No additional sites of metastatic disease identified. Radiation 10/25/2016-12/01/2016 PET scan 03/28/2017-previously enlarged hypermetabolic right external iliac node-normal in size with resolution of hypermetabolic activity, no active malignancy identified PET scan 05/15/2018-no evidence of recurrent or metastatic disease, no hypermetabolic lymph nodes CT abdomen/pelvis 09/19/2019-mild increase in the size of several left upper quadrant peritoneal nodules measuring up to 11 mm.  No other sites of metastatic disease identified.  No ascites. CT abdomen/pelvis 03/14/2020-slight enlargement of several left upper  quadrant peritoneal nodules, no ascites, no other evidence of disease progression CT abdomen/pelvis 09/12/2020 peritoneal nodularity/omental caking predominantly in the left upper/mid abdomen, mildly  progressive.  Largest implant in the left upper abdomen adjacent to the stomach now measures 17 mm, previously 13 mm.  Overall volume of peritoneal disease has mildly progressed. CT abdomen/pelvis 01/13/2021- 4 mm right ureteral calculus with moderate right hydronephrosis and upper right hydroureter, progressive omental nodularity, stable calcified and partially calcified right pelvic nodules CT abdomen/pelvis 05/13/2021-enlargement of left upper quadrant omental nodules and a peritoneal nodule at the right iliac fossa, no ascites CT abdomen/pelvis 08/14/2021-mild increase in size of omental nodules, no ascites, no new site of metastatic disease Taxol/carboplatin 09/09/2021, 09/15/2021, 09/22/2021 Taxol/carboplatin 10/07/2021, 10/13/2021, 10/20/2021 Taxol/Carboplatin 11/03/2021, 11/10/2021, 11/17/2021 CT 11/23/2021-decrease in peritoneal metastases, no new or progressive disease, airspace opacity at both lung bases Taxol/carboplatin 12/01/2021   2. Low abdomen/suprapubic pain prior to the exploratory laparotomy-likely secondary to omental/pelvic tumor; persistent mild pain in the lower abdomen   3. Chronic neck and back pain.   4. Anxiety -persistent despite Lexapro and Xanax. She has been evaluated by psychiatry. Currently on Lexapro. 5. Status post Port-A-Cath placement 09/22/2012. The Port-A-Cath was removed on 04/03/2013.   6. Neutropenia secondary to chemotherapy- day 15 cycle 1 and cycle 3. Taxol/carboplatin not given secondary to neutropenia.    7. Herpes zoster involving a right thoracic dermatome July 2015. She completed a course of Valtrex. 8. Nodular bony prominence at the left pelvis on rectal exam 06/05/2015-likely a benign finding 9.  Right ureter stone/hydroureteronephrosis on CT 01/13/2021-referred to urology; status post right ureteroscopy with stone extraction and ureteral stent placement.  Stent removal 03/11/2021       Disposition: [name] [name] has completed 3 cycles of weekly  Taxol/carboplatin.  She has tolerated treatment well.  The CA125 is lower and the restaging CT is consistent with a clinical response.  She will complete 1 more cycle of weekly Taxol/carboplatin.  We will then consider discontinuing Taxol or changing to an every 2-week regimen.  She will be scheduled for Taxol/carboplatin 12/08/2021 and 12/15/2021.  She will return for an office visit on 12/21/2021.  I reviewed the CT findings and images with [name] [name].  [name], MD  12/01/2021  9:12 AM

## 2021-12-01 NOTE — Patient Instructions (Signed)
[place]   Discharge Instructions: Thank you for choosing [facility] to provide your oncology and hematology care.   If you have a lab appointment with the [facility], please go directly to the [facility] and check in at the registration area.   Wear comfortable clothing and clothing appropriate for easy access to any Portacath or PICC line.   We strive to give you quality time with your provider. You may need to reschedule your appointment if you arrive late (15 or more minutes).  Arriving late affects you and other patients whose appointments are after yours.  Also, if you miss three or more appointments without notifying the office, you may be dismissed from the clinic at the providers discretion.      For prescription refill requests, have your pharmacy contact our office and allow 72 hours for refills to be completed.    Today you received the following chemotherapy and/or immunotherapy agents Taxol, Carboplatin      To help prevent nausea and vomiting after your treatment, we encourage you to take your nausea medication as directed.  BELOW ARE SYMPTOMS THAT SHOULD BE REPORTED IMMEDIATELY: *FEVER GREATER THAN 100.4 F (38 C) OR HIGHER *CHILLS OR SWEATING *NAUSEA AND VOMITING THAT IS NOT CONTROLLED WITH YOUR NAUSEA MEDICATION *UNUSUAL SHORTNESS OF BREATH *UNUSUAL BRUISING OR BLEEDING *URINARY PROBLEMS (pain or burning when urinating, or frequent urination) *BOWEL PROBLEMS (unusual diarrhea, constipation, pain near the anus) TENDERNESS IN MOUTH AND THROAT WITH OR WITHOUT PRESENCE OF ULCERS (sore throat, sores in mouth, or a toothache) UNUSUAL RASH, SWELLING OR PAIN  UNUSUAL VAGINAL DISCHARGE OR ITCHING   Items with * indicate a potential emergency and should be followed up as soon as possible or go to the Emergency Department if any problems should occur.  Please show the CHEMOTHERAPY ALERT CARD or IMMUNOTHERAPY ALERT CARD at  check-in to the Emergency Department and triage nurse.  Should you have questions after your visit or need to cancel or reschedule your appointment, please contact [facility]  Dept: [phone]  and follow the prompts.  Office hours are 8:00 a.m. to 4:30 p.m. Monday - Friday. Please note that voicemails left after 4:00 p.m. may not be returned until the following business day.  We are closed weekends and major holidays. You have access to a nurse at all times for urgent questions. Please call the main number to the clinic Dept: [phone] and follow the prompts.   For any non-urgent questions, you may also contact your provider using MyChart. We now offer e-Visits for anyone 80 and older to request care online for non-urgent symptoms. For details visit [email or web address].   Also download the MyChart app! Go to the app store, search "MyChart", open the app, select [facility], and log in with your MyChart username and password.  Due to Covid, a mask is required upon entering the hospital/clinic. If you do not have a mask, one will be given to you upon arrival. For doctor visits, patients may have 1 support person aged 60 or older with them. For treatment visits, patients cannot have anyone with them due to current Covid guidelines and our immunocompromised population.   Paclitaxel injection What is this medication? PACLITAXEL (PAK li TAX el) is a chemotherapy drug. It targets fast dividing cells, like cancer cells, and causes these cells to die. This medicine is used to treat ovarian cancer, breast cancer, lung cancer, Kaposi's sarcoma, and other cancers. This  medicine may be used for other purposes; ask your health care provider or pharmacist if you have questions. COMMON BRAND NAME(S): Onxol, Taxol What should I tell my care team before I take this medication? They need to know if you have any of these conditions: history of irregular heartbeat liver  disease low blood counts, like low white cell, platelet, or red cell counts lung or breathing disease, like asthma tingling of the fingers or toes, or other nerve disorder an unusual or allergic reaction to paclitaxel, alcohol, polyoxyethylated castor oil, other chemotherapy, other medicines, foods, dyes, or preservatives pregnant or trying to get pregnant breast-feeding How should I use this medication? This drug is given as an infusion into a vein. It is administered in a hospital or clinic by a specially trained health care professional. Talk to your pediatrician regarding the use of this medicine in children. Special care may be needed. Overdosage: If you think you have taken too much of this medicine contact a poison control center or emergency room at once. NOTE: This medicine is only for you. Do not share this medicine with others. What if I miss a dose? It is important not to miss your dose. Call your doctor or health care professional if you are unable to keep an appointment. What may interact with this medication? Do not take this medicine with any of the following medications: live virus vaccines This medicine may also interact with the following medications: antiviral medicines for hepatitis, HIV or AIDS certain antibiotics like erythromycin and clarithromycin certain medicines for fungal infections like ketoconazole and itraconazole certain medicines for seizures like carbamazepine, phenobarbital, phenytoin gemfibrozil nefazodone rifampin St. John's wort This list may not describe all possible interactions. Give your health care provider a list of all the medicines, herbs, non-prescription drugs, or dietary supplements you use. Also tell them if you smoke, drink alcohol, or use illegal drugs. Some items may interact with your medicine. What should I watch for while using this medication? Your condition will be monitored carefully while you are receiving this medicine. You  will need important blood work done while you are taking this medicine. This medicine can cause serious allergic reactions. To reduce your risk you will need to take other medicine(s) before treatment with this medicine. If you experience allergic reactions like skin rash, itching or hives, swelling of the face, lips, or tongue, tell your doctor or health care professional right away. In some cases, you may be given additional medicines to help with side effects. Follow all directions for their use. This drug may make you feel generally unwell. This is not uncommon, as chemotherapy can affect healthy cells as well as cancer cells. Report any side effects. Continue your course of treatment even though you feel ill unless your doctor tells you to stop. Call your doctor or health care professional for advice if you get a fever, chills or sore throat, or other symptoms of a cold or flu. Do not treat yourself. This drug decreases your body's ability to fight infections. Try to avoid being around people who are sick. This medicine may increase your risk to bruise or bleed. Call your doctor or health care professional if you notice any unusual bleeding. Be careful brushing and flossing your teeth or using a toothpick because you may get an infection or bleed more easily. If you have any dental work done, tell your dentist you are receiving this medicine. Avoid taking products that contain aspirin, acetaminophen, ibuprofen, naproxen, or ketoprofen unless instructed  by your doctor. These medicines may hide a fever. Do not become pregnant while taking this medicine. Women should inform their doctor if they wish to become pregnant or think they might be pregnant. There is a potential for serious side effects to an unborn child. Talk to your health care professional or pharmacist for more information. Do not breast-feed an infant while taking this medicine. Men are advised not to father a child while receiving this  medicine. This product may contain alcohol. Ask your pharmacist or healthcare provider if this medicine contains alcohol. Be sure to tell all healthcare providers you are taking this medicine. Certain medicines, like metronidazole and disulfiram, can cause an unpleasant reaction when taken with alcohol. The reaction includes flushing, headache, nausea, vomiting, sweating, and increased thirst. The reaction can last from 30 minutes to several hours. What side effects may I notice from receiving this medication? Side effects that you should report to your doctor or health care professional as soon as possible: allergic reactions like skin rash, itching or hives, swelling of the face, lips, or tongue breathing problems changes in vision fast, irregular heartbeat high or low blood pressure mouth sores pain, tingling, numbness in the hands or feet signs of decreased platelets or bleeding - bruising, pinpoint red spots on the skin, black, tarry stools, blood in the urine signs of decreased red blood cells - unusually weak or tired, feeling faint or lightheaded, falls signs of infection - fever or chills, cough, sore throat, pain or difficulty passing urine signs and symptoms of liver injury like dark yellow or brown urine; general ill feeling or flu-like symptoms; light-colored stools; loss of appetite; nausea; right upper belly pain; unusually weak or tired; yellowing of the eyes or skin swelling of the ankles, feet, hands unusually slow heartbeat Side effects that usually do not require medical attention (report to your doctor or health care professional if they continue or are bothersome): diarrhea hair loss loss of appetite muscle or joint pain nausea, vomiting pain, redness, or irritation at site where injected tiredness This list may not describe all possible side effects. Call your doctor for medical advice about side effects. You may report side effects to FDA at 1-800-FDA-1088. Where  should I keep my medication? This drug is given in a hospital or clinic and will not be stored at home. NOTE: This sheet is a summary. It may not cover all possible information. If you have questions about this medicine, talk to your doctor, pharmacist, or health care provider.  2022 Elsevier/Gold Standard (2021-06-09 00:00:00)  Carboplatin injection What is this medication? CARBOPLATIN (KAR boe pla tin) is a chemotherapy drug. It targets fast dividing cells, like cancer cells, and causes these cells to die. This medicine is used to treat ovarian cancer and many other cancers. This medicine may be used for other purposes; ask your health care provider or pharmacist if you have questions. COMMON BRAND NAME(S): Paraplatin What should I tell my care team before I take this medication? They need to know if you have any of these conditions: blood disorders hearing problems kidney disease recent or ongoing radiation therapy an unusual or allergic reaction to carboplatin, cisplatin, other chemotherapy, other medicines, foods, dyes, or preservatives pregnant or trying to get pregnant breast-feeding How should I use this medication? This drug is usually given as an infusion into a vein. It is administered in a hospital or clinic by a specially trained health care professional. Talk to your pediatrician regarding the use of this medicine  in children. Special care may be needed. Overdosage: If you think you have taken too much of this medicine contact a poison control center or emergency room at once. NOTE: This medicine is only for you. Do not share this medicine with others. What if I miss a dose? It is important not to miss a dose. Call your doctor or health care professional if you are unable to keep an appointment. What may interact with this medication? medicines for seizures medicines to increase blood counts like filgrastim, pegfilgrastim, sargramostim some antibiotics like amikacin,  gentamicin, neomycin, streptomycin, tobramycin vaccines Talk to your doctor or health care professional before taking any of these medicines: acetaminophen aspirin ibuprofen ketoprofen naproxen This list may not describe all possible interactions. Give your health care provider a list of all the medicines, herbs, non-prescription drugs, or dietary supplements you use. Also tell them if you smoke, drink alcohol, or use illegal drugs. Some items may interact with your medicine. What should I watch for while using this medication? Your condition will be monitored carefully while you are receiving this medicine. You will need important blood work done while you are taking this medicine. This drug may make you feel generally unwell. This is not uncommon, as chemotherapy can affect healthy cells as well as cancer cells. Report any side effects. Continue your course of treatment even though you feel ill unless your doctor tells you to stop. In some cases, you may be given additional medicines to help with side effects. Follow all directions for their use. Call your doctor or health care professional for advice if you get a fever, chills or sore throat, or other symptoms of a cold or flu. Do not treat yourself. This drug decreases your body's ability to fight infections. Try to avoid being around people who are sick. This medicine may increase your risk to bruise or bleed. Call your doctor or health care professional if you notice any unusual bleeding. Be careful brushing and flossing your teeth or using a toothpick because you may get an infection or bleed more easily. If you have any dental work done, tell your dentist you are receiving this medicine. Avoid taking products that contain aspirin, acetaminophen, ibuprofen, naproxen, or ketoprofen unless instructed by your doctor. These medicines may hide a fever. Do not become pregnant while taking this medicine. Women should inform their doctor if they wish  to become pregnant or think they might be pregnant. There is a potential for serious side effects to an unborn child. Talk to your health care professional or pharmacist for more information. Do not breast-feed an infant while taking this medicine. What side effects may I notice from receiving this medication? Side effects that you should report to your doctor or health care professional as soon as possible: allergic reactions like skin rash, itching or hives, swelling of the face, lips, or tongue signs of infection - fever or chills, cough, sore throat, pain or difficulty passing urine signs of decreased platelets or bleeding - bruising, pinpoint red spots on the skin, black, tarry stools, nosebleeds signs of decreased red blood cells - unusually weak or tired, fainting spells, lightheadedness breathing problems changes in hearing changes in vision chest pain high blood pressure low blood counts - This drug may decrease the number of white blood cells, red blood cells and platelets. You may be at increased risk for infections and bleeding. nausea and vomiting pain, swelling, redness or irritation at the injection site pain, tingling, numbness in the hands or  feet problems with balance, talking, walking trouble passing urine or change in the amount of urine Side effects that usually do not require medical attention (report to your doctor or health care professional if they continue or are bothersome): hair loss loss of appetite metallic taste in the mouth or changes in taste This list may not describe all possible side effects. Call your doctor for medical advice about side effects. You may report side effects to FDA at 1-800-FDA-1088. Where should I keep my medication? This drug is given in a hospital or clinic and will not be stored at home. NOTE: This sheet is a summary. It may not cover all possible information. If you have questions about this medicine, talk to your doctor, pharmacist,  or health care provider.  2022 Elsevier/Gold Standard (2008-02-28 00:00:00)

## 2021-12-02 LAB — CA 125: Cancer Antigen (CA) 125: 18.8 U/mL (ref 0.0–38.1)

## 2021-12-05 ENCOUNTER — Other Ambulatory Visit: Payer: Self-pay | Admitting: Oncology

## 2021-12-08 ENCOUNTER — Inpatient Hospital Stay: Payer: Medicare Other

## 2021-12-08 ENCOUNTER — Inpatient Hospital Stay: Payer: Medicare Other | Attending: Oncology

## 2021-12-08 ENCOUNTER — Other Ambulatory Visit: Payer: Self-pay

## 2021-12-08 VITALS — BP 130/77 | HR 70 | Temp 97.8°F | Resp 18 | Ht 62.0 in | Wt 152.0 lb

## 2021-12-08 DIAGNOSIS — Z5111 Encounter for antineoplastic chemotherapy: Secondary | ICD-10-CM | POA: Insufficient documentation

## 2021-12-08 DIAGNOSIS — C569 Malignant neoplasm of unspecified ovary: Secondary | ICD-10-CM | POA: Insufficient documentation

## 2021-12-08 DIAGNOSIS — C786 Secondary malignant neoplasm of retroperitoneum and peritoneum: Secondary | ICD-10-CM | POA: Insufficient documentation

## 2021-12-08 DIAGNOSIS — D701 Agranulocytosis secondary to cancer chemotherapy: Secondary | ICD-10-CM | POA: Diagnosis not present

## 2021-12-08 LAB — CMP (CANCER CENTER ONLY)
ALT: 24 U/L (ref 0–44)
AST: 19 U/L (ref 15–41)
Albumin: 4 g/dL (ref 3.5–5.0)
Alkaline Phosphatase: 54 U/L (ref 38–126)
Anion gap: 8 (ref 5–15)
BUN: 20 mg/dL (ref 8–23)
CO2: 26 mmol/L (ref 22–32)
Calcium: 8.9 mg/dL (ref 8.9–10.3)
Chloride: 105 mmol/L (ref 98–111)
Creatinine: 0.64 mg/dL (ref 0.44–1.00)
GFR, Estimated: >60 mL/min (ref >60)
Glucose, Bld: 130 mg/dL — ABNORMAL HIGH (ref 70–99)
Potassium: 3.9 mmol/L (ref 3.5–5.1)
Sodium: 139 mmol/L (ref 135–145)
Total Bilirubin: 0.3 mg/dL (ref 0.3–1.2)
Total Protein: 6.5 g/dL (ref 6.5–8.1)

## 2021-12-08 LAB — CBC WITH DIFFERENTIAL (CANCER CENTER ONLY)
Abs Immature Granulocytes: 0.1 10*3/uL — ABNORMAL HIGH (ref 0.00–0.07)
Basophils Absolute: 0.1 10*3/uL (ref 0.0–0.1)
Basophils Relative: 2 %
Eosinophils Absolute: 0.1 10*3/uL (ref 0.0–0.5)
Eosinophils Relative: 2 %
HCT: 36.8 % (ref 36.0–46.0)
Hemoglobin: 11.8 g/dL — ABNORMAL LOW (ref 12.0–15.0)
Immature Granulocytes: 2 %
Lymphocytes Relative: 18 %
Lymphs Abs: 0.9 10*3/uL (ref 0.7–4.0)
MCH: 33.4 pg (ref 26.0–34.0)
MCHC: 32.1 g/dL (ref 30.0–36.0)
MCV: 104.2 fL — ABNORMAL HIGH (ref 80.0–100.0)
Monocytes Absolute: 0.4 10*3/uL (ref 0.1–1.0)
Monocytes Relative: 8 %
Neutro Abs: 3.1 10*3/uL (ref 1.7–7.7)
Neutrophils Relative %: 68 %
Platelet Count: 248 10*3/uL (ref 150–400)
RBC: 3.53 MIL/uL — ABNORMAL LOW (ref 3.87–5.11)
RDW: 16.7 % — ABNORMAL HIGH (ref 11.5–15.5)
WBC Count: 4.6 10*3/uL (ref 4.0–10.5)
nRBC: 0 % (ref 0.0–0.2)

## 2021-12-08 MED ORDER — PALONOSETRON HCL INJECTION 0.25 MG/5ML
0.2500 mg | Freq: Once | INTRAVENOUS | Status: AC
  Administered 2021-12-08: 0.25 mg via INTRAVENOUS
  Filled 2021-12-08: qty 5

## 2021-12-08 MED ORDER — SODIUM CHLORIDE 0.9 % IV SOLN: Freq: Once | INTRAVENOUS | Status: AC

## 2021-12-08 MED ORDER — SODIUM CHLORIDE 0.9 % IV SOLN
10.0000 mg | Freq: Once | INTRAVENOUS | Status: AC
  Administered 2021-12-08: 10 mg via INTRAVENOUS
  Filled 2021-12-08: qty 10

## 2021-12-08 MED ORDER — HEPARIN SOD (PORK) LOCK FLUSH 100 UNIT/ML IV SOLN
500.0000 [IU] | Freq: Once | INTRAVENOUS | Status: AC | PRN
  Administered 2021-12-08: 500 [IU]

## 2021-12-08 MED ORDER — FAMOTIDINE IN NACL 20-0.9 MG/50ML-% IV SOLN
20.0000 mg | Freq: Once | INTRAVENOUS | Status: AC
  Administered 2021-12-08: 20 mg via INTRAVENOUS
  Filled 2021-12-08: qty 50

## 2021-12-08 MED ORDER — SODIUM CHLORIDE 0.9% FLUSH
10.0000 mL | INTRAVENOUS | Status: DC | PRN
  Administered 2021-12-08: 10 mL

## 2021-12-08 MED ORDER — SODIUM CHLORIDE 0.9 % IV SOLN
157.8000 mg | Freq: Once | INTRAVENOUS | Status: AC
  Administered 2021-12-08: 160 mg via INTRAVENOUS
  Filled 2021-12-08: qty 16

## 2021-12-08 MED ORDER — SODIUM CHLORIDE 0.9 % IV SOLN
80.0000 mg/m2 | Freq: Once | INTRAVENOUS | Status: AC
  Administered 2021-12-08: 138 mg via INTRAVENOUS
  Filled 2021-12-08: qty 23

## 2021-12-08 MED ORDER — DIPHENHYDRAMINE HCL 50 MG/ML IJ SOLN
25.0000 mg | Freq: Once | INTRAMUSCULAR | Status: AC
  Administered 2021-12-08: 25 mg via INTRAVENOUS
  Filled 2021-12-08: qty 1

## 2021-12-08 NOTE — Progress Notes (Signed)
Patient presents for treatment. RN assessment completed along with the following: ? ?Labs/vitals reviewed - Yes, and within treatment parameters.   ?Weight within 10% of previous measurement - Yes ?Informed consent completed and reflects current therapy/intent - Yes, on date 09/09/2021             ?Provider progress note reviewed - Patient not seen by provider today. Most recent note dated 12/01/2021 reviewed. ?Treatment/Antibody/Supportive plan reviewed - Yes, and there are no adjustments needed for today's treatment. ?S&H and other orders reviewed - Yes, and there are no additional orders identified. ?Previous treatment date reviewed - Yes, and the appropriate amount of time has elapsed between treatments. ?Clinic Hand Off Received from - No ? ?Patient to proceed with treatment.  ? ?

## 2021-12-08 NOTE — Patient Instructions (Signed)
[facility]   Discharge Instructions: Thank you for choosing [facility] to provide your oncology and hematology care.   If you have a lab appointment with the [facility], please go directly to the [facility] and check in at the registration area.   Wear comfortable clothing and clothing appropriate for easy access to any Portacath or PICC line.   We strive to give you quality time with your provider. You may need to reschedule your appointment if you arrive late (15 or more minutes).  Arriving late affects you and other patients whose appointments are after yours.  Also, if you miss three or more appointments without notifying the office, you may be dismissed from the clinic at the providers discretion.      For prescription refill requests, have your pharmacy contact our office and allow 72 hours for refills to be completed.    Today you received the following chemotherapy and/or immunotherapy agents Paclitaxel (TAXOL) & Carboplatin (PARAPLATIN).      To help prevent nausea and vomiting after your treatment, we encourage you to take your nausea medication as directed.  BELOW ARE SYMPTOMS THAT SHOULD BE REPORTED IMMEDIATELY: *FEVER GREATER THAN 100.4 F (38 C) OR HIGHER *CHILLS OR SWEATING *NAUSEA AND VOMITING THAT IS NOT CONTROLLED WITH YOUR NAUSEA MEDICATION *UNUSUAL SHORTNESS OF BREATH *UNUSUAL BRUISING OR BLEEDING *URINARY PROBLEMS (pain or burning when urinating, or frequent urination) *BOWEL PROBLEMS (unusual diarrhea, constipation, pain near the anus) TENDERNESS IN MOUTH AND THROAT WITH OR WITHOUT PRESENCE OF ULCERS (sore throat, sores in mouth, or a toothache) UNUSUAL RASH, SWELLING OR PAIN  UNUSUAL VAGINAL DISCHARGE OR ITCHING   Items with * indicate a potential emergency and should be followed up as soon as possible or go to the Emergency Department if any problems should occur.  Please show the CHEMOTHERAPY ALERT CARD or  IMMUNOTHERAPY ALERT CARD at check-in to the Emergency Department and triage nurse.  Should you have questions after your visit or need to cancel or reschedule your appointment, please contact [facility]  Dept: [phone]  and follow the prompts.  Office hours are 8:00 a.m. to 4:30 p.m. Monday - Friday. Please note that voicemails left after 4:00 p.m. may not be returned until the following business day.  We are closed weekends and major holidays. You have access to a nurse at all times for urgent questions. Please call the main number to the clinic Dept: [phone] and follow the prompts.   For any non-urgent questions, you may also contact your provider using MyChart. We now offer e-Visits for anyone 50 and older to request care online for non-urgent symptoms. For details visit [email or web address].   Also download the MyChart app! Go to the app store, search "MyChart", open the app, select [facility], and log in with your MyChart username and password.  Due to Covid, a mask is required upon entering the hospital/clinic. If you do not have a mask, one will be given to you upon arrival. For doctor visits, patients may have 1 support person aged 34 or older with them. For treatment visits, patients cannot have anyone with them due to current Covid guidelines and our immunocompromised population.   Paclitaxel injection What is this medication? PACLITAXEL (PAK li TAX el) is a chemotherapy drug. It targets fast dividing cells, like cancer cells, and causes these cells to die. This medicine is used to treat ovarian cancer, breast cancer, lung cancer, Kaposi's sarcoma, and  other cancers. This medicine may be used for other purposes; ask your health care provider or pharmacist if you have questions. COMMON BRAND NAME(S): Onxol, Taxol What should I tell my care team before I take this medication? They need to know if you have any of these conditions: history of irregular  heartbeat liver disease low blood counts, like low white cell, platelet, or red cell counts lung or breathing disease, like asthma tingling of the fingers or toes, or other nerve disorder an unusual or allergic reaction to paclitaxel, alcohol, polyoxyethylated castor oil, other chemotherapy, other medicines, foods, dyes, or preservatives pregnant or trying to get pregnant breast-feeding How should I use this medication? This drug is given as an infusion into a vein. It is administered in a hospital or clinic by a specially trained health care professional. Talk to your pediatrician regarding the use of this medicine in children. Special care may be needed. Overdosage: If you think you have taken too much of this medicine contact a poison control center or emergency room at once. NOTE: This medicine is only for you. Do not share this medicine with others. What if I miss a dose? It is important not to miss your dose. Call your doctor or health care professional if you are unable to keep an appointment. What may interact with this medication? Do not take this medicine with any of the following medications: live virus vaccines This medicine may also interact with the following medications: antiviral medicines for hepatitis, HIV or AIDS certain antibiotics like erythromycin and clarithromycin certain medicines for fungal infections like ketoconazole and itraconazole certain medicines for seizures like carbamazepine, phenobarbital, phenytoin gemfibrozil nefazodone rifampin St. John's wort This list may not describe all possible interactions. Give your health care provider a list of all the medicines, herbs, non-prescription drugs, or dietary supplements you use. Also tell them if you smoke, drink alcohol, or use illegal drugs. Some items may interact with your medicine. What should I watch for while using this medication? Your condition will be monitored carefully while you are receiving this  medicine. You will need important blood work done while you are taking this medicine. This medicine can cause serious allergic reactions. To reduce your risk you will need to take other medicine(s) before treatment with this medicine. If you experience allergic reactions like skin rash, itching or hives, swelling of the face, lips, or tongue, tell your doctor or health care professional right away. In some cases, you may be given additional medicines to help with side effects. Follow all directions for their use. This drug may make you feel generally unwell. This is not uncommon, as chemotherapy can affect healthy cells as well as cancer cells. Report any side effects. Continue your course of treatment even though you feel ill unless your doctor tells you to stop. Call your doctor or health care professional for advice if you get a fever, chills or sore throat, or other symptoms of a cold or flu. Do not treat yourself. This drug decreases your body's ability to fight infections. Try to avoid being around people who are sick. This medicine may increase your risk to bruise or bleed. Call your doctor or health care professional if you notice any unusual bleeding. Be careful brushing and flossing your teeth or using a toothpick because you may get an infection or bleed more easily. If you have any dental work done, tell your dentist you are receiving this medicine. Avoid taking products that contain aspirin, acetaminophen, ibuprofen, naproxen, or  ketoprofen unless instructed by your doctor. These medicines may hide a fever. Do not become pregnant while taking this medicine. Women should inform their doctor if they wish to become pregnant or think they might be pregnant. There is a potential for serious side effects to an unborn child. Talk to your health care professional or pharmacist for more information. Do not breast-feed an infant while taking this medicine. Men are advised not to father a child while  receiving this medicine. This product may contain alcohol. Ask your pharmacist or healthcare provider if this medicine contains alcohol. Be sure to tell all healthcare providers you are taking this medicine. Certain medicines, like metronidazole and disulfiram, can cause an unpleasant reaction when taken with alcohol. The reaction includes flushing, headache, nausea, vomiting, sweating, and increased thirst. The reaction can last from 30 minutes to several hours. What side effects may I notice from receiving this medication? Side effects that you should report to your doctor or health care professional as soon as possible: allergic reactions like skin rash, itching or hives, swelling of the face, lips, or tongue breathing problems changes in vision fast, irregular heartbeat high or low blood pressure mouth sores pain, tingling, numbness in the hands or feet signs of decreased platelets or bleeding - bruising, pinpoint red spots on the skin, black, tarry stools, blood in the urine signs of decreased red blood cells - unusually weak or tired, feeling faint or lightheaded, falls signs of infection - fever or chills, cough, sore throat, pain or difficulty passing urine signs and symptoms of liver injury like dark yellow or brown urine; general ill feeling or flu-like symptoms; light-colored stools; loss of appetite; nausea; right upper belly pain; unusually weak or tired; yellowing of the eyes or skin swelling of the ankles, feet, hands unusually slow heartbeat Side effects that usually do not require medical attention (report to your doctor or health care professional if they continue or are bothersome): diarrhea hair loss loss of appetite muscle or joint pain nausea, vomiting pain, redness, or irritation at site where injected tiredness This list may not describe all possible side effects. Call your doctor for medical advice about side effects. You may report side effects to FDA at  1-800-FDA-1088. Where should I keep my medication? This drug is given in a hospital or clinic and will not be stored at home. NOTE: This sheet is a summary. It may not cover all possible information. If you have questions about this medicine, talk to your doctor, pharmacist, or health care provider.  2022 Elsevier/Gold Standard (2021-06-09 00:00:00)  Carboplatin injection What is this medication? CARBOPLATIN (KAR boe pla tin) is a chemotherapy drug. It targets fast dividing cells, like cancer cells, and causes these cells to die. This medicine is used to treat ovarian cancer and many other cancers. This medicine may be used for other purposes; ask your health care provider or pharmacist if you have questions. COMMON BRAND NAME(S): Paraplatin What should I tell my care team before I take this medication? They need to know if you have any of these conditions: blood disorders hearing problems kidney disease recent or ongoing radiation therapy an unusual or allergic reaction to carboplatin, cisplatin, other chemotherapy, other medicines, foods, dyes, or preservatives pregnant or trying to get pregnant breast-feeding How should I use this medication? This drug is usually given as an infusion into a vein. It is administered in a hospital or clinic by a specially trained health care professional. Talk to your pediatrician regarding the use  of this medicine in children. Special care may be needed. Overdosage: If you think you have taken too much of this medicine contact a poison control center or emergency room at once. NOTE: This medicine is only for you. Do not share this medicine with others. What if I miss a dose? It is important not to miss a dose. Call your doctor or health care professional if you are unable to keep an appointment. What may interact with this medication? medicines for seizures medicines to increase blood counts like filgrastim, pegfilgrastim, sargramostim some  antibiotics like amikacin, gentamicin, neomycin, streptomycin, tobramycin vaccines Talk to your doctor or health care professional before taking any of these medicines: acetaminophen aspirin ibuprofen ketoprofen naproxen This list may not describe all possible interactions. Give your health care provider a list of all the medicines, herbs, non-prescription drugs, or dietary supplements you use. Also tell them if you smoke, drink alcohol, or use illegal drugs. Some items may interact with your medicine. What should I watch for while using this medication? Your condition will be monitored carefully while you are receiving this medicine. You will need important blood work done while you are taking this medicine. This drug may make you feel generally unwell. This is not uncommon, as chemotherapy can affect healthy cells as well as cancer cells. Report any side effects. Continue your course of treatment even though you feel ill unless your doctor tells you to stop. In some cases, you may be given additional medicines to help with side effects. Follow all directions for their use. Call your doctor or health care professional for advice if you get a fever, chills or sore throat, or other symptoms of a cold or flu. Do not treat yourself. This drug decreases your body's ability to fight infections. Try to avoid being around people who are sick. This medicine may increase your risk to bruise or bleed. Call your doctor or health care professional if you notice any unusual bleeding. Be careful brushing and flossing your teeth or using a toothpick because you may get an infection or bleed more easily. If you have any dental work done, tell your dentist you are receiving this medicine. Avoid taking products that contain aspirin, acetaminophen, ibuprofen, naproxen, or ketoprofen unless instructed by your doctor. These medicines may hide a fever. Do not become pregnant while taking this medicine. Women should  inform their doctor if they wish to become pregnant or think they might be pregnant. There is a potential for serious side effects to an unborn child. Talk to your health care professional or pharmacist for more information. Do not breast-feed an infant while taking this medicine. What side effects may I notice from receiving this medication? Side effects that you should report to your doctor or health care professional as soon as possible: allergic reactions like skin rash, itching or hives, swelling of the face, lips, or tongue signs of infection - fever or chills, cough, sore throat, pain or difficulty passing urine signs of decreased platelets or bleeding - bruising, pinpoint red spots on the skin, black, tarry stools, nosebleeds signs of decreased red blood cells - unusually weak or tired, fainting spells, lightheadedness breathing problems changes in hearing changes in vision chest pain high blood pressure low blood counts - This drug may decrease the number of white blood cells, red blood cells and platelets. You may be at increased risk for infections and bleeding. nausea and vomiting pain, swelling, redness or irritation at the injection site pain, tingling, numbness in  the hands or feet problems with balance, talking, walking trouble passing urine or change in the amount of urine Side effects that usually do not require medical attention (report to your doctor or health care professional if they continue or are bothersome): hair loss loss of appetite metallic taste in the mouth or changes in taste This list may not describe all possible side effects. Call your doctor for medical advice about side effects. You may report side effects to FDA at 1-800-FDA-1088. Where should I keep my medication? This drug is given in a hospital or clinic and will not be stored at home. NOTE: This sheet is a summary. It may not cover all possible information. If you have questions about this medicine,  talk to your doctor, pharmacist, or health care provider.  2022 Elsevier/Gold Standard (2008-02-28 00:00:00)

## 2021-12-12 ENCOUNTER — Other Ambulatory Visit: Payer: Self-pay | Admitting: Oncology

## 2021-12-15 ENCOUNTER — Inpatient Hospital Stay: Payer: Medicare Other

## 2021-12-15 ENCOUNTER — Other Ambulatory Visit: Payer: Self-pay

## 2021-12-15 VITALS — BP 131/81 | HR 73 | Temp 98.5°F | Resp 18 | Ht 62.0 in | Wt 151.8 lb

## 2021-12-15 DIAGNOSIS — C569 Malignant neoplasm of unspecified ovary: Secondary | ICD-10-CM

## 2021-12-15 DIAGNOSIS — Z5111 Encounter for antineoplastic chemotherapy: Secondary | ICD-10-CM | POA: Diagnosis not present

## 2021-12-15 DIAGNOSIS — C786 Secondary malignant neoplasm of retroperitoneum and peritoneum: Secondary | ICD-10-CM | POA: Diagnosis not present

## 2021-12-15 DIAGNOSIS — D701 Agranulocytosis secondary to cancer chemotherapy: Secondary | ICD-10-CM | POA: Diagnosis not present

## 2021-12-15 LAB — CMP (CANCER CENTER ONLY)
ALT: 19 U/L (ref 0–44)
AST: 15 U/L (ref 15–41)
Albumin: 4 g/dL (ref 3.5–5.0)
Alkaline Phosphatase: 48 U/L (ref 38–126)
Anion gap: 9 (ref 5–15)
BUN: 23 mg/dL (ref 8–23)
CO2: 26 mmol/L (ref 22–32)
Calcium: 9.4 mg/dL (ref 8.9–10.3)
Chloride: 105 mmol/L (ref 98–111)
Creatinine: 0.6 mg/dL (ref 0.44–1.00)
GFR, Estimated: >60 mL/min (ref >60)
Glucose, Bld: 91 mg/dL (ref 70–99)
Potassium: 3.9 mmol/L (ref 3.5–5.1)
Sodium: 140 mmol/L (ref 135–145)
Total Bilirubin: 0.4 mg/dL (ref 0.3–1.2)
Total Protein: 6.5 g/dL (ref 6.5–8.1)

## 2021-12-15 LAB — CBC WITH DIFFERENTIAL (CANCER CENTER ONLY)
Abs Immature Granulocytes: 0.04 10*3/uL (ref 0.00–0.07)
Basophils Absolute: 0.1 10*3/uL (ref 0.0–0.1)
Basophils Relative: 2 %
Eosinophils Absolute: 0 10*3/uL (ref 0.0–0.5)
Eosinophils Relative: 0 %
HCT: 36.1 % (ref 36.0–46.0)
Hemoglobin: 11.7 g/dL — ABNORMAL LOW (ref 12.0–15.0)
Immature Granulocytes: 1 %
Lymphocytes Relative: 23 %
Lymphs Abs: 0.8 10*3/uL (ref 0.7–4.0)
MCH: 34.2 pg — ABNORMAL HIGH (ref 26.0–34.0)
MCHC: 32.4 g/dL (ref 30.0–36.0)
MCV: 105.6 fL — ABNORMAL HIGH (ref 80.0–100.0)
Monocytes Absolute: 0.3 10*3/uL (ref 0.1–1.0)
Monocytes Relative: 9 %
Neutro Abs: 2.2 10*3/uL (ref 1.7–7.7)
Neutrophils Relative %: 65 %
Platelet Count: 219 10*3/uL (ref 150–400)
RBC: 3.42 MIL/uL — ABNORMAL LOW (ref 3.87–5.11)
RDW: 16.5 % — ABNORMAL HIGH (ref 11.5–15.5)
WBC Count: 3.4 10*3/uL — ABNORMAL LOW (ref 4.0–10.5)
nRBC: 0 % (ref 0.0–0.2)

## 2021-12-15 MED ORDER — HEPARIN SOD (PORK) LOCK FLUSH 100 UNIT/ML IV SOLN
500.0000 [IU] | Freq: Once | INTRAVENOUS | Status: AC | PRN
  Administered 2021-12-15: 500 [IU]

## 2021-12-15 MED ORDER — DIPHENHYDRAMINE HCL 50 MG/ML IJ SOLN
25.0000 mg | Freq: Once | INTRAMUSCULAR | Status: AC
  Administered 2021-12-15: 25 mg via INTRAVENOUS
  Filled 2021-12-15: qty 1

## 2021-12-15 MED ORDER — SODIUM CHLORIDE 0.9 % IV SOLN
10.0000 mg | Freq: Once | INTRAVENOUS | Status: AC
  Administered 2021-12-15: 10 mg via INTRAVENOUS
  Filled 2021-12-15: qty 1

## 2021-12-15 MED ORDER — SODIUM CHLORIDE 0.9% FLUSH
10.0000 mL | INTRAVENOUS | Status: DC | PRN
  Administered 2021-12-15: 10 mL

## 2021-12-15 MED ORDER — SODIUM CHLORIDE 0.9 % IV SOLN: Freq: Once | INTRAVENOUS | Status: AC

## 2021-12-15 MED ORDER — SODIUM CHLORIDE 0.9 % IV SOLN
157.8000 mg | Freq: Once | INTRAVENOUS | Status: AC
  Administered 2021-12-15: 160 mg via INTRAVENOUS
  Filled 2021-12-15: qty 16

## 2021-12-15 MED ORDER — FAMOTIDINE IN NACL 20-0.9 MG/50ML-% IV SOLN
20.0000 mg | Freq: Once | INTRAVENOUS | Status: AC
  Administered 2021-12-15: 20 mg via INTRAVENOUS
  Filled 2021-12-15: qty 50

## 2021-12-15 MED ORDER — SODIUM CHLORIDE 0.9 % IV SOLN
80.0000 mg/m2 | Freq: Once | INTRAVENOUS | Status: AC
  Administered 2021-12-15: 138 mg via INTRAVENOUS
  Filled 2021-12-15: qty 23

## 2021-12-15 MED ORDER — PALONOSETRON HCL INJECTION 0.25 MG/5ML
0.2500 mg | Freq: Once | INTRAVENOUS | Status: AC
  Administered 2021-12-15: 0.25 mg via INTRAVENOUS
  Filled 2021-12-15: qty 5

## 2021-12-15 NOTE — Patient Instructions (Signed)
[facility]   ?Discharge Instructions: ?Thank you for choosing [facility] to provide your oncology and hematology care.  ? ?If you have a lab appointment with the [facility], please go directly to the [facility] and check in at the registration area. ?  ?Wear comfortable clothing and clothing appropriate for easy access to any Portacath or PICC line.  ? ?We strive to give you quality time with your provider. You may need to reschedule your appointment if you arrive late (15 or more minutes).  Arriving late affects you and other patients whose appointments are after yours.  Also, if you miss three or more appointments without notifying the office, you may be dismissed from the clinic at the provider?s discretion.    ?  ?For prescription refill requests, have your pharmacy contact our office and allow 72 hours for refills to be completed.   ? ?Today you received the following chemotherapy and/or immunotherapy agents Taxol, Carboplatin    ?  ?To help prevent nausea and vomiting after your treatment, we encourage you to take your nausea medication as directed. ? ?BELOW ARE SYMPTOMS THAT SHOULD BE REPORTED IMMEDIATELY: ?*FEVER GREATER THAN 100.4 F (38 ?C) OR HIGHER ?*CHILLS OR SWEATING ?*NAUSEA AND VOMITING THAT IS NOT CONTROLLED WITH YOUR NAUSEA MEDICATION ?*UNUSUAL SHORTNESS OF BREATH ?*UNUSUAL BRUISING OR BLEEDING ?*URINARY PROBLEMS (pain or burning when urinating, or frequent urination) ?*BOWEL PROBLEMS (unusual diarrhea, constipation, pain near the anus) ?TENDERNESS IN MOUTH AND THROAT WITH OR WITHOUT PRESENCE OF ULCERS (sore throat, sores in mouth, or a toothache) ?UNUSUAL RASH, SWELLING OR PAIN  ?UNUSUAL VAGINAL DISCHARGE OR ITCHING  ? ?Items with * indicate a potential emergency and should be followed up as soon as possible or go to the Emergency Department if any problems should occur. ? ?Please show the CHEMOTHERAPY ALERT CARD or IMMUNOTHERAPY ALERT CARD at  check-in to the Emergency Department and triage nurse. ? ?Should you have questions after your visit or need to cancel or reschedule your appointment, please contact [facility]  Dept: [phone]  and follow the prompts.  Office hours are 8:00 a.m. to 4:30 p.m. Monday - Friday. Please note that voicemails left after 4:00 p.m. may not be returned until the following business day.  We are closed weekends and major holidays. You have access to a nurse at all times for urgent questions. Please call the main number to the clinic Dept: [phone] and follow the prompts. ? ? ?For any non-urgent questions, you may also contact your provider using MyChart. We now offer e-Visits for anyone 80 and older to request care online for non-urgent symptoms. For details visit [email or web address]. ?  ?Also download the MyChart app! Go to the app store, search "MyChart", open the app, select [facility], and log in with your MyChart username and password. ? ?Due to Covid, a mask is required upon entering the hospital/clinic. If you do not have a mask, one will be given to you upon arrival. For doctor visits, patients may have 1 support person aged 60 or older with them. For treatment visits, patients cannot have anyone with them due to current Covid guidelines and our immunocompromised population.  ? ?Paclitaxel injection ?What is this medication? ?PACLITAXEL (PAK li TAX el) is a chemotherapy drug. It targets fast dividing cells, like cancer cells, and causes these cells to die. This medicine is used to treat ovarian cancer, breast cancer, lung cancer, Kaposi's sarcoma, and other cancers. ?This  medicine may be used for other purposes; ask your health care provider or pharmacist if you have questions. ?COMMON BRAND NAME(S): Onxol, Taxol ?What should I tell my care team before I take this medication? ?They need to know if you have any of these conditions: ?history of irregular heartbeat ?liver  disease ?low blood counts, like low white cell, platelet, or red cell counts ?lung or breathing disease, like asthma ?tingling of the fingers or toes, or other nerve disorder ?an unusual or allergic reaction to paclitaxel, alcohol, polyoxyethylated castor oil, other chemotherapy, other medicines, foods, dyes, or preservatives ?pregnant or trying to get pregnant ?breast-feeding ?How should I use this medication? ?This drug is given as an infusion into a vein. It is administered in a hospital or clinic by a specially trained health care professional. ?Talk to your pediatrician regarding the use of this medicine in children. Special care may be needed. ?Overdosage: If you think you have taken too much of this medicine contact a poison control center or emergency room at once. ?NOTE: This medicine is only for you. Do not share this medicine with others. ?What if I miss a dose? ?It is important not to miss your dose. Call your doctor or health care professional if you are unable to keep an appointment. ?What may interact with this medication? ?Do not take this medicine with any of the following medications: ?live virus vaccines ?This medicine may also interact with the following medications: ?antiviral medicines for hepatitis, HIV or AIDS ?certain antibiotics like erythromycin and clarithromycin ?certain medicines for fungal infections like ketoconazole and itraconazole ?certain medicines for seizures like carbamazepine, phenobarbital, phenytoin ?gemfibrozil ?nefazodone ?rifampin ?St. John's wort ?This list may not describe all possible interactions. Give your health care provider a list of all the medicines, herbs, non-prescription drugs, or dietary supplements you use. Also tell them if you smoke, drink alcohol, or use illegal drugs. Some items may interact with your medicine. ?What should I watch for while using this medication? ?Your condition will be monitored carefully while you are receiving this medicine. You  will need important blood work done while you are taking this medicine. ?This medicine can cause serious allergic reactions. To reduce your risk you will need to take other medicine(s) before treatment with this medicine. If you experience allergic reactions like skin rash, itching or hives, swelling of the face, lips, or tongue, tell your doctor or health care professional right away. ?In some cases, you may be given additional medicines to help with side effects. Follow all directions for their use. ?This drug may make you feel generally unwell. This is not uncommon, as chemotherapy can affect healthy cells as well as cancer cells. Report any side effects. Continue your course of treatment even though you feel ill unless your doctor tells you to stop. ?Call your doctor or health care professional for advice if you get a fever, chills or sore throat, or other symptoms of a cold or flu. Do not treat yourself. This drug decreases your body's ability to fight infections. Try to avoid being around people who are sick. ?This medicine may increase your risk to bruise or bleed. Call your doctor or health care professional if you notice any unusual bleeding. ?Be careful brushing and flossing your teeth or using a toothpick because you may get an infection or bleed more easily. If you have any dental work done, tell your dentist you are receiving this medicine. ?Avoid taking products that contain aspirin, acetaminophen, ibuprofen, naproxen, or ketoprofen unless instructed  by your doctor. These medicines may hide a fever. ?Do not become pregnant while taking this medicine. Women should inform their doctor if they wish to become pregnant or think they might be pregnant. There is a potential for serious side effects to an unborn child. Talk to your health care professional or pharmacist for more information. Do not breast-feed an infant while taking this medicine. ?Men are advised not to father a child while receiving this  medicine. ?This product may contain alcohol. Ask your pharmacist or healthcare provider if this medicine contains alcohol. Be sure to tell all healthcare providers you are taking this medicine. Certain medicines, like met

## 2021-12-15 NOTE — Progress Notes (Signed)
Patient presents for treatment. RN assessment completed along with the following: ? ?Labs/vitals reviewed - Yes, and within treatment parameters.   ?Weight within 10% of previous measurement - Yes ?Informed consent completed and reflects current therapy/intent - Yes, on date 09/09/21             ?Provider progress note reviewed - Patient not seen by provider today. Most recent note dated 12/01/21 reviewed. ?Treatment/Antibody/Supportive plan reviewed - Yes, and there are no adjustments needed for today's treatment. ?S&H and other orders reviewed - Yes, and there are no additional orders identified. ?Previous treatment date reviewed - Yes, and the appropriate amount of time has elapsed between treatments. ?Clinic Hand Off Received from - none ? ? ?Patient to proceed with treatment.  ? ?

## 2021-12-26 ENCOUNTER — Other Ambulatory Visit: Payer: Self-pay | Admitting: Oncology

## 2021-12-29 ENCOUNTER — Inpatient Hospital Stay: Payer: Medicare Other

## 2021-12-29 ENCOUNTER — Encounter: Payer: Self-pay | Admitting: Nurse Practitioner

## 2021-12-29 ENCOUNTER — Other Ambulatory Visit: Payer: Self-pay

## 2021-12-29 ENCOUNTER — Encounter: Payer: Self-pay | Admitting: *Deleted

## 2021-12-29 ENCOUNTER — Inpatient Hospital Stay: Payer: Medicare Other | Admitting: Nurse Practitioner

## 2021-12-29 VITALS — BP 108/71 | HR 99 | Temp 97.8°F | Resp 18 | Ht 62.0 in | Wt 152.2 lb

## 2021-12-29 DIAGNOSIS — C569 Malignant neoplasm of unspecified ovary: Secondary | ICD-10-CM | POA: Diagnosis not present

## 2021-12-29 DIAGNOSIS — D701 Agranulocytosis secondary to cancer chemotherapy: Secondary | ICD-10-CM | POA: Diagnosis not present

## 2021-12-29 DIAGNOSIS — C786 Secondary malignant neoplasm of retroperitoneum and peritoneum: Secondary | ICD-10-CM | POA: Diagnosis not present

## 2021-12-29 DIAGNOSIS — Z5111 Encounter for antineoplastic chemotherapy: Secondary | ICD-10-CM | POA: Diagnosis not present

## 2021-12-29 LAB — CBC WITH DIFFERENTIAL (CANCER CENTER ONLY)
Abs Immature Granulocytes: 0.02 10*3/uL (ref 0.00–0.07)
Basophils Absolute: 0 10*3/uL (ref 0.0–0.1)
Basophils Relative: 1 %
Eosinophils Absolute: 0 10*3/uL (ref 0.0–0.5)
Eosinophils Relative: 1 %
HCT: 38.5 % (ref 36.0–46.0)
Hemoglobin: 12.3 g/dL (ref 12.0–15.0)
Immature Granulocytes: 1 %
Lymphocytes Relative: 20 %
Lymphs Abs: 0.7 10*3/uL (ref 0.7–4.0)
MCH: 34.2 pg — ABNORMAL HIGH (ref 26.0–34.0)
MCHC: 31.9 g/dL (ref 30.0–36.0)
MCV: 106.9 fL — ABNORMAL HIGH (ref 80.0–100.0)
Monocytes Absolute: 0.6 10*3/uL (ref 0.1–1.0)
Monocytes Relative: 16 %
Neutro Abs: 2.2 10*3/uL (ref 1.7–7.7)
Neutrophils Relative %: 61 %
Platelet Count: 216 10*3/uL (ref 150–400)
RBC: 3.6 MIL/uL — ABNORMAL LOW (ref 3.87–5.11)
RDW: 16.6 % — ABNORMAL HIGH (ref 11.5–15.5)
WBC Count: 3.5 10*3/uL — ABNORMAL LOW (ref 4.0–10.5)
nRBC: 0 % (ref 0.0–0.2)

## 2021-12-29 LAB — CMP (CANCER CENTER ONLY)
ALT: 20 U/L (ref 0–44)
AST: 18 U/L (ref 15–41)
Albumin: 4.1 g/dL (ref 3.5–5.0)
Alkaline Phosphatase: 47 U/L (ref 38–126)
Anion gap: 11 (ref 5–15)
BUN: 24 mg/dL — ABNORMAL HIGH (ref 8–23)
CO2: 25 mmol/L (ref 22–32)
Calcium: 9.2 mg/dL (ref 8.9–10.3)
Chloride: 104 mmol/L (ref 98–111)
Creatinine: 0.72 mg/dL (ref 0.44–1.00)
GFR, Estimated: >60 mL/min (ref >60)
Glucose, Bld: 109 mg/dL — ABNORMAL HIGH (ref 70–99)
Potassium: 3.9 mmol/L (ref 3.5–5.1)
Sodium: 140 mmol/L (ref 135–145)
Total Bilirubin: 0.5 mg/dL (ref 0.3–1.2)
Total Protein: 6.7 g/dL (ref 6.5–8.1)

## 2021-12-29 MED ORDER — SODIUM CHLORIDE 0.9 % IV SOLN
157.8000 mg | Freq: Once | INTRAVENOUS | Status: AC
  Administered 2021-12-29: 160 mg via INTRAVENOUS
  Filled 2021-12-29: qty 16

## 2021-12-29 MED ORDER — DIPHENHYDRAMINE HCL 50 MG/ML IJ SOLN
25.0000 mg | Freq: Once | INTRAMUSCULAR | Status: AC
  Administered 2021-12-29: 25 mg via INTRAVENOUS
  Filled 2021-12-29: qty 1

## 2021-12-29 MED ORDER — HEPARIN SOD (PORK) LOCK FLUSH 100 UNIT/ML IV SOLN
500.0000 [IU] | Freq: Once | INTRAVENOUS | Status: AC | PRN
  Administered 2021-12-29: 500 [IU]

## 2021-12-29 MED ORDER — SODIUM CHLORIDE 0.9 % IV SOLN
80.0000 mg/m2 | Freq: Once | INTRAVENOUS | Status: AC
  Administered 2021-12-29: 138 mg via INTRAVENOUS
  Filled 2021-12-29: qty 23

## 2021-12-29 MED ORDER — PALONOSETRON HCL INJECTION 0.25 MG/5ML
0.2500 mg | Freq: Once | INTRAVENOUS | Status: AC
  Administered 2021-12-29: 0.25 mg via INTRAVENOUS
  Filled 2021-12-29: qty 5

## 2021-12-29 MED ORDER — FAMOTIDINE IN NACL 20-0.9 MG/50ML-% IV SOLN
20.0000 mg | Freq: Once | INTRAVENOUS | Status: AC
  Administered 2021-12-29: 20 mg via INTRAVENOUS
  Filled 2021-12-29: qty 50

## 2021-12-29 MED ORDER — SODIUM CHLORIDE 0.9 % IV SOLN: Freq: Once | INTRAVENOUS | Status: AC

## 2021-12-29 MED ORDER — SODIUM CHLORIDE 0.9% FLUSH
10.0000 mL | INTRAVENOUS | Status: DC | PRN
  Administered 2021-12-29: 10 mL

## 2021-12-29 MED ORDER — SODIUM CHLORIDE 0.9 % IV SOLN
10.0000 mg | Freq: Once | INTRAVENOUS | Status: AC
  Administered 2021-12-29: 10 mg via INTRAVENOUS
  Filled 2021-12-29: qty 1

## 2021-12-29 NOTE — Progress Notes (Signed)
Patient presents for treatment. RN assessment completed along with the following: ? ?Labs/vitals reviewed - Yes, and within treatment parameters.   ?Weight within 10% of previous measurement - Yes ?Informed consent completed and reflects current therapy/intent - Yes, on date 09/09/21             ?Provider progress note reviewed - Yes, today's provider note was reviewed. ?Treatment/Antibody/Supportive plan reviewed - Yes, and there are no adjustments needed for today's treatment. ?S&H and other orders reviewed - Yes, and there are no additional orders identified. ?Previous treatment date reviewed - Yes, and the appropriate amount of time has elapsed between treatments. ?Clinic Hand Off Received from - Cristy Friedlander, RN ? ?Patient to proceed with treatment.  ? ?

## 2021-12-29 NOTE — Patient Instructions (Addendum)
[facility]   ?Discharge Instructions: ?Thank you for choosing [facility] to provide your oncology and hematology care.  ? ?If you have a lab appointment with the [facility], please go directly to the [facility] and check in at the registration area. ?  ?Wear comfortable clothing and clothing appropriate for easy access to any Portacath or PICC line.  ? ?We strive to give you quality time with your provider. You may need to reschedule your appointment if you arrive late (15 or more minutes).  Arriving late affects you and other patients whose appointments are after yours.  Also, if you miss three or more appointments without notifying the office, you may be dismissed from the clinic at the provider?s discretion.    ?  ?For prescription refill requests, have your pharmacy contact our office and allow 72 hours for refills to be completed.   ? ?Today you received the following chemotherapy and/or immunotherapy agents Paclitaxel, Carboplatin    ?  ?To help prevent nausea and vomiting after your treatment, we encourage you to take your nausea medication as directed. ? ?BELOW ARE SYMPTOMS THAT SHOULD BE REPORTED IMMEDIATELY: ?*FEVER GREATER THAN 100.4 F (38 ?C) OR HIGHER ?*CHILLS OR SWEATING ?*NAUSEA AND VOMITING THAT IS NOT CONTROLLED WITH YOUR NAUSEA MEDICATION ?*UNUSUAL SHORTNESS OF BREATH ?*UNUSUAL BRUISING OR BLEEDING ?*URINARY PROBLEMS (pain or burning when urinating, or frequent urination) ?*BOWEL PROBLEMS (unusual diarrhea, constipation, pain near the anus) ?TENDERNESS IN MOUTH AND THROAT WITH OR WITHOUT PRESENCE OF ULCERS (sore throat, sores in mouth, or a toothache) ?UNUSUAL RASH, SWELLING OR PAIN  ?UNUSUAL VAGINAL DISCHARGE OR ITCHING  ? ?Items with * indicate a potential emergency and should be followed up as soon as possible or go to the Emergency Department if any problems should occur. ? ?Please show the CHEMOTHERAPY ALERT CARD or IMMUNOTHERAPY ALERT CARD at  check-in to the Emergency Department and triage nurse. ? ?Should you have questions after your visit or need to cancel or reschedule your appointment, please contact [facility]  Dept: [phone]  and follow the prompts.  Office hours are 8:00 a.m. to 4:30 p.m. Monday - Friday. Please note that voicemails left after 4:00 p.m. may not be returned until the following business day.  We are closed weekends and major holidays. You have access to a nurse at all times for urgent questions. Please call the main number to the clinic Dept: [phone] and follow the prompts. ? ? ?For any non-urgent questions, you may also contact your provider using MyChart. We now offer e-Visits for anyone 40 and older to request care online for non-urgent symptoms. For details visit [email or web address]. ?  ?Also download the MyChart app! Go to the app store, search "MyChart", open the app, select [facility], and log in with your MyChart username and password. ? ?Due to Covid, a mask is required upon entering the hospital/clinic. If you do not have a mask, one will be given to you upon arrival. For doctor visits, patients may have 1 support person aged 27 or older with them. For treatment visits, patients cannot have anyone with them due to current Covid guidelines and our immunocompromised population.  ? ?Paclitaxel injection ?What is this medication? ?PACLITAXEL (PAK li TAX el) is a chemotherapy drug. It targets fast dividing cells, like cancer cells, and causes these cells to die. This medicine is used to treat ovarian cancer, breast cancer, lung cancer, Kaposi's sarcoma, and other cancers. ?This  medicine may be used for other purposes; ask your health care provider or pharmacist if you have questions. ?COMMON BRAND NAME(S): Onxol, Taxol ?What should I tell my care team before I take this medication? ?They need to know if you have any of these conditions: ?history of irregular heartbeat ?liver  disease ?low blood counts, like low white cell, platelet, or red cell counts ?lung or breathing disease, like asthma ?tingling of the fingers or toes, or other nerve disorder ?an unusual or allergic reaction to paclitaxel, alcohol, polyoxyethylated castor oil, other chemotherapy, other medicines, foods, dyes, or preservatives ?pregnant or trying to get pregnant ?breast-feeding ?How should I use this medication? ?This drug is given as an infusion into a vein. It is administered in a hospital or clinic by a specially trained health care professional. ?Talk to your pediatrician regarding the use of this medicine in children. Special care may be needed. ?Overdosage: If you think you have taken too much of this medicine contact a poison control center or emergency room at once. ?NOTE: This medicine is only for you. Do not share this medicine with others. ?What if I miss a dose? ?It is important not to miss your dose. Call your doctor or health care professional if you are unable to keep an appointment. ?What may interact with this medication? ?Do not take this medicine with any of the following medications: ?live virus vaccines ?This medicine may also interact with the following medications: ?antiviral medicines for hepatitis, HIV or AIDS ?certain antibiotics like erythromycin and clarithromycin ?certain medicines for fungal infections like ketoconazole and itraconazole ?certain medicines for seizures like carbamazepine, phenobarbital, phenytoin ?gemfibrozil ?nefazodone ?rifampin ?St. John's wort ?This list may not describe all possible interactions. Give your health care provider a list of all the medicines, herbs, non-prescription drugs, or dietary supplements you use. Also tell them if you smoke, drink alcohol, or use illegal drugs. Some items may interact with your medicine. ?What should I watch for while using this medication? ?Your condition will be monitored carefully while you are receiving this medicine. You  will need important blood work done while you are taking this medicine. ?This medicine can cause serious allergic reactions. To reduce your risk you will need to take other medicine(s) before treatment with this medicine. If you experience allergic reactions like skin rash, itching or hives, swelling of the face, lips, or tongue, tell your doctor or health care professional right away. ?In some cases, you may be given additional medicines to help with side effects. Follow all directions for their use. ?This drug may make you feel generally unwell. This is not uncommon, as chemotherapy can affect healthy cells as well as cancer cells. Report any side effects. Continue your course of treatment even though you feel ill unless your doctor tells you to stop. ?Call your doctor or health care professional for advice if you get a fever, chills or sore throat, or other symptoms of a cold or flu. Do not treat yourself. This drug decreases your body's ability to fight infections. Try to avoid being around people who are sick. ?This medicine may increase your risk to bruise or bleed. Call your doctor or health care professional if you notice any unusual bleeding. ?Be careful brushing and flossing your teeth or using a toothpick because you may get an infection or bleed more easily. If you have any dental work done, tell your dentist you are receiving this medicine. ?Avoid taking products that contain aspirin, acetaminophen, ibuprofen, naproxen, or ketoprofen unless instructed  by your doctor. These medicines may hide a fever. ?Do not become pregnant while taking this medicine. Women should inform their doctor if they wish to become pregnant or think they might be pregnant. There is a potential for serious side effects to an unborn child. Talk to your health care professional or pharmacist for more information. Do not breast-feed an infant while taking this medicine. ?Men are advised not to father a child while receiving this  medicine. ?This product may contain alcohol. Ask your pharmacist or healthcare provider if this medicine contains alcohol. Be sure to tell all healthcare providers you are taking this medicine. Certain medicines, lik

## 2021-12-29 NOTE — Progress Notes (Signed)
?[name] [name] ?OFFICE PROGRESS NOTE ? ? ?Diagnosis: Ovarian cancer ? ?INTERVAL HISTORY:  ? ?[name] [name] returns as scheduled.  She completed another cycle of weekly Taxol/carboplatin 12/15/2021.  She has intermittent mild nausea.  She has intermittent loose stools, not on a daily basis.  No mouth sores.  She has intermittent numbness/tingling in the fingertips. ? ?Objective: ? ?Vital signs in last 24 hours: ? ?Blood pressure 108/71, pulse 99, temperature 97.8 ?F (36.6 ?C), temperature source Oral, resp. rate 18, height _0  (1.575 m), weight 152 lb 3.2 oz (69 kg), SpO2 98 %. ?  ? ?HEENT: No thrush or ulcers. ?Resp: Lungs clear bilaterally. ?Cardio: Regular rate and rhythm. ?GI: Abdomen soft and nontender.  No hepatomegaly.  No mass. ?Vascular: No leg edema. ?Neuro: Vibratory sense mildly decreased over the fingertips per tuning fork exam. ?Port-A-Cath without erythema. ? ?Lab Results: ? ?Lab Results  ?Component Value Date  ? WBC 3.5 (L) 12/29/2021  ? HGB 12.3 12/29/2021  ? HCT 38.5 12/29/2021  ? MCV 106.9 (H) 12/29/2021  ? PLT 216 12/29/2021  ? NEUTROABS 2.2 12/29/2021  ? ? ?Imaging: ? ?No results found. ? ?Medications: I have reviewed the patient's current medications. ? 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 A 5 mm nodules remain on the right diaphragm. - ?TumorNext paired germline/tumor analyses: No somatic variants detected, germline CHEK2      VUS      ?Cycle 1 of adjuvant Taxol/carboplatin chemotherapy initiated on 09/19/2012.   ?The CA 125 normalized.   ?She completed day 15 of cycle 6 on 02/06/2013.   ?Restaging CT evaluation 03/29/2013 showed no evidence of metastatic disease in the chest. There was marked improvement in appearance/resolution of previous described peritoneal/omental disease. There was no convincing  evidence of residual disease. There was minimal increased density in the region of the omentum favored to be treatment-related. There was no pelvic adenopathy. ?CA125 3.2 on 02/19/2014. ?08/18/2016 CA-125 8 ?CT abdomen/pelvis 08/25/2016-solitary new enlarged right external iliac lymph node measuring 2.2 cm. ?Biopsy 09/07/2016-adenocarcinoma consistent with high-grade serous carcinoma. ?PET scan 09/22/2016-malignant range FDG uptake associated with the enlarged right external iliac lymph node compatible with metastatic adenopathy.  No additional sites of metastatic disease identified. ?Radiation 10/25/2016-12/01/2016 ?PET scan 03/28/2017-previously enlarged hypermetabolic right external iliac node-normal in size with resolution of hypermetabolic activity, no active malignancy identified ?PET scan 05/15/2018-no evidence of recurrent or metastatic disease, no hypermetabolic lymph nodes ?CT abdomen/pelvis 09/19/2019-mild increase in the size of several left upper quadrant peritoneal nodules measuring up to 11 mm.  No other sites of metastatic disease identified.  No ascites. ?CT abdomen/pelvis 03/14/2020-slight enlargement of several left upper quadrant peritoneal nodules, no ascites, no other evidence of disease progression ?CT abdomen/pelvis 09/12/2020 peritoneal nodularity/omental caking predominantly in the left upper/mid abdomen, mildly progressive.  Largest implant in the left upper abdomen adjacent to the stomach now measures 17 mm, previously 13 mm.  Overall volume of peritoneal disease has mildly progressed. ?CT abdomen/pelvis 01/13/2021- 4 mm right ureteral calculus with moderate right hydronephrosis and upper right hydroureter, progressive omental nodularity, stable calcified and partially calcified right pelvic nodules ?CT abdomen/pelvis 05/13/2021-enlargement of left upper quadrant omental nodules and a peritoneal nodule at the right iliac fossa, no ascites ?CT abdomen/pelvis 08/14/2021-mild increase in size of  omental nodules, no ascites, no new site of metastatic disease ?Taxol/carboplatin 09/09/2021, 09/15/2021, 09/22/2021 ?Taxol/carboplatin 10/07/2021, 10/13/2021, 10/20/2021 ?Taxol/Carboplatin 11/03/2021, 11/10/2021,  11/17/2021 ?CT 11/23/2021-decrease in peritoneal metastases, no new or progressive disease, airspace opacity at both lung bases ?Taxol/carboplatin 12/01/2021, 12/08/2021, 12/15/2021 ?Taxol/Carboplatin every 2 weeks beginning 12/29/2021 ?  ?2. Low abdomen/suprapubic pain prior to the exploratory laparotomy-likely secondary to omental/pelvic tumor; persistent mild pain in the lower abdomen   ?3. Chronic neck and back pain.   ?4. Anxiety -persistent despite Lexapro and Xanax. She has been evaluated by psychiatry. Currently on Lexapro. ?5. Status post Port-A-Cath placement 09/22/2012. The Port-A-Cath was removed on 04/03/2013.   ?6. Neutropenia secondary to chemotherapy- day 15 cycle 1 and cycle 3. Taxol/carboplatin not given secondary to neutropenia.    ?7. Herpes zoster involving a right thoracic dermatome July 2015. She completed a course of Valtrex. ?8. Nodular bony prominence at the left pelvis on rectal exam 06/05/2015-likely a benign finding ?9.  Right ureter stone/hydroureteronephrosis on CT 01/13/2021-referred to urology; status post right ureteroscopy with stone extraction and ureteral stent placement.  Stent removal 03/11/2021 ?  ? ?Disposition: [name] [name] appears stable.  She has completed 4 cycles of Taxol/carboplatin weekly x3 followed by 1 week break.  Overall she is tolerating treatment well.  CT scans from 11/23/2021 showed a decrease in peritoneal metastases, no new or progressive disease.  Most recent CA 125 tumor marker was further improved in the normal range.  Dr. Gearldine Shown recommendation is to continue treatment but change to a 2-week schedule beginning today.  [name] [name] agrees with this plan.  We will continue to monitor for progressive neuropathy. ? ?CBC from today reviewed.  Counts adequate to proceed  with treatment. ? ?She will return for lab, follow-up, treatment in 2 weeks.  We are available to see her sooner if needed. ? ? ?Ned Card ANP/GNP-BC  ? ?12/29/2021  ?8:29 AM ? ? ? ? ? ? ? ?

## 2021-12-29 NOTE — Progress Notes (Signed)
Patient seen by Ned Card NP today ? ?Vitals are within treatment parameters. ? ?Labs reviewed by Ned Card NP and are within treatment parameters. ? ?Per physician team, patient is ready for treatment. Please note that modifications are being made to the treatment plan including Treatment changed to every 2 weeks.   ?

## 2021-12-30 LAB — CA 125: Cancer Antigen (CA) 125: 17.3 U/mL (ref 0.0–38.1)

## 2022-01-04 DIAGNOSIS — H524 Presbyopia: Secondary | ICD-10-CM | POA: Diagnosis not present

## 2022-01-11 ENCOUNTER — Other Ambulatory Visit: Payer: Self-pay | Admitting: Oncology

## 2022-01-12 ENCOUNTER — Encounter: Payer: Self-pay | Admitting: *Deleted

## 2022-01-12 ENCOUNTER — Other Ambulatory Visit: Payer: Self-pay | Admitting: *Deleted

## 2022-01-12 ENCOUNTER — Inpatient Hospital Stay: Payer: Medicare Other

## 2022-01-12 ENCOUNTER — Inpatient Hospital Stay: Payer: Medicare Other | Admitting: Oncology

## 2022-01-12 ENCOUNTER — Inpatient Hospital Stay: Payer: Medicare Other | Attending: Oncology

## 2022-01-12 VITALS — BP 120/76 | HR 78 | Temp 98.1°F | Resp 18 | Ht 62.0 in | Wt 152.8 lb

## 2022-01-12 DIAGNOSIS — D701 Agranulocytosis secondary to cancer chemotherapy: Secondary | ICD-10-CM | POA: Insufficient documentation

## 2022-01-12 DIAGNOSIS — Z5111 Encounter for antineoplastic chemotherapy: Secondary | ICD-10-CM | POA: Insufficient documentation

## 2022-01-12 DIAGNOSIS — M549 Dorsalgia, unspecified: Secondary | ICD-10-CM | POA: Insufficient documentation

## 2022-01-12 DIAGNOSIS — Z90722 Acquired absence of ovaries, bilateral: Secondary | ICD-10-CM | POA: Insufficient documentation

## 2022-01-12 DIAGNOSIS — F419 Anxiety disorder, unspecified: Secondary | ICD-10-CM | POA: Diagnosis not present

## 2022-01-12 DIAGNOSIS — C569 Malignant neoplasm of unspecified ovary: Secondary | ICD-10-CM

## 2022-01-12 DIAGNOSIS — C786 Secondary malignant neoplasm of retroperitoneum and peritoneum: Secondary | ICD-10-CM | POA: Diagnosis not present

## 2022-01-12 DIAGNOSIS — N132 Hydronephrosis with renal and ureteral calculous obstruction: Secondary | ICD-10-CM | POA: Insufficient documentation

## 2022-01-12 DIAGNOSIS — T451X5A Adverse effect of antineoplastic and immunosuppressive drugs, initial encounter: Secondary | ICD-10-CM | POA: Diagnosis not present

## 2022-01-12 DIAGNOSIS — G629 Polyneuropathy, unspecified: Secondary | ICD-10-CM | POA: Insufficient documentation

## 2022-01-12 DIAGNOSIS — Z79899 Other long term (current) drug therapy: Secondary | ICD-10-CM | POA: Insufficient documentation

## 2022-01-12 DIAGNOSIS — M542 Cervicalgia: Secondary | ICD-10-CM | POA: Diagnosis not present

## 2022-01-12 DIAGNOSIS — G8929 Other chronic pain: Secondary | ICD-10-CM | POA: Diagnosis not present

## 2022-01-12 LAB — CMP (CANCER CENTER ONLY)
ALT: 20 U/L (ref 0–44)
AST: 16 U/L (ref 15–41)
Albumin: 4 g/dL (ref 3.5–5.0)
Alkaline Phosphatase: 46 U/L (ref 38–126)
Anion gap: 7 (ref 5–15)
BUN: 25 mg/dL — ABNORMAL HIGH (ref 8–23)
CO2: 27 mmol/L (ref 22–32)
Calcium: 9.2 mg/dL (ref 8.9–10.3)
Chloride: 106 mmol/L (ref 98–111)
Creatinine: 0.7 mg/dL (ref 0.44–1.00)
GFR, Estimated: >60 mL/min (ref >60)
Glucose, Bld: 87 mg/dL (ref 70–99)
Potassium: 3.9 mmol/L (ref 3.5–5.1)
Sodium: 140 mmol/L (ref 135–145)
Total Bilirubin: 0.4 mg/dL (ref 0.3–1.2)
Total Protein: 6.4 g/dL — ABNORMAL LOW (ref 6.5–8.1)

## 2022-01-12 LAB — CBC WITH DIFFERENTIAL (CANCER CENTER ONLY)
Abs Immature Granulocytes: 0.01 10*3/uL (ref 0.00–0.07)
Basophils Absolute: 0 10*3/uL (ref 0.0–0.1)
Basophils Relative: 1 %
Eosinophils Absolute: 0.1 10*3/uL (ref 0.0–0.5)
Eosinophils Relative: 2 %
HCT: 37.1 % (ref 36.0–46.0)
Hemoglobin: 12.1 g/dL (ref 12.0–15.0)
Immature Granulocytes: 0 %
Lymphocytes Relative: 17 %
Lymphs Abs: 0.7 10*3/uL (ref 0.7–4.0)
MCH: 34.9 pg — ABNORMAL HIGH (ref 26.0–34.0)
MCHC: 32.6 g/dL (ref 30.0–36.0)
MCV: 106.9 fL — ABNORMAL HIGH (ref 80.0–100.0)
Monocytes Absolute: 0.7 10*3/uL (ref 0.1–1.0)
Monocytes Relative: 18 %
Neutro Abs: 2.5 10*3/uL (ref 1.7–7.7)
Neutrophils Relative %: 62 %
Platelet Count: 223 10*3/uL (ref 150–400)
RBC: 3.47 MIL/uL — ABNORMAL LOW (ref 3.87–5.11)
RDW: 15.6 % — ABNORMAL HIGH (ref 11.5–15.5)
WBC Count: 4 10*3/uL (ref 4.0–10.5)
nRBC: 0 % (ref 0.0–0.2)

## 2022-01-12 MED ORDER — SODIUM CHLORIDE 0.9 % IV SOLN
80.0000 mg/m2 | Freq: Once | INTRAVENOUS | Status: AC
  Administered 2022-01-12: 138 mg via INTRAVENOUS
  Filled 2022-01-12: qty 23

## 2022-01-12 MED ORDER — SODIUM CHLORIDE 0.9 % IV SOLN
10.0000 mg | Freq: Once | INTRAVENOUS | Status: AC
  Administered 2022-01-12: 10 mg via INTRAVENOUS
  Filled 2022-01-12: qty 1

## 2022-01-12 MED ORDER — SODIUM CHLORIDE 0.9 % IV SOLN: Freq: Once | INTRAVENOUS | Status: AC

## 2022-01-12 MED ORDER — DIPHENHYDRAMINE HCL 50 MG/ML IJ SOLN
25.0000 mg | Freq: Once | INTRAMUSCULAR | Status: AC
  Administered 2022-01-12: 25 mg via INTRAVENOUS
  Filled 2022-01-12: qty 1

## 2022-01-12 MED ORDER — SODIUM CHLORIDE 0.9 % IV SOLN
157.8000 mg | Freq: Once | INTRAVENOUS | Status: AC
  Administered 2022-01-12: 160 mg via INTRAVENOUS
  Filled 2022-01-12: qty 16

## 2022-01-12 MED ORDER — FAMOTIDINE IN NACL 20-0.9 MG/50ML-% IV SOLN
20.0000 mg | Freq: Once | INTRAVENOUS | Status: AC
  Administered 2022-01-12: 20 mg via INTRAVENOUS
  Filled 2022-01-12: qty 50

## 2022-01-12 MED ORDER — PALONOSETRON HCL INJECTION 0.25 MG/5ML
0.2500 mg | Freq: Once | INTRAVENOUS | Status: AC
  Administered 2022-01-12: 0.25 mg via INTRAVENOUS
  Filled 2022-01-12: qty 5

## 2022-01-12 MED ORDER — SODIUM CHLORIDE 0.9% FLUSH
10.0000 mL | INTRAVENOUS | Status: DC | PRN
  Administered 2022-01-12: 10 mL

## 2022-01-12 MED ORDER — HEPARIN SOD (PORK) LOCK FLUSH 100 UNIT/ML IV SOLN
500.0000 [IU] | Freq: Once | INTRAVENOUS | Status: AC | PRN
  Administered 2022-01-12: 500 [IU]

## 2022-01-12 NOTE — Progress Notes (Signed)
?[name] [name] ?OFFICE PROGRESS NOTE ? ? ?Diagnosis: Ovarian cancer ? ?INTERVAL HISTORY:  ? ?[name] [name] completed another cycle of Taxol/carboplatin on 12/29/2021.  [name] [name] reports mild peripheral numbness and tingling.  This does not interfere with activity.  [name] [name] has difficulty gripping and buttoning her shirt.  [name] [name] relates this to hand arthritis.  Neuropathy symptoms have not changed over the past several weeks.  [name] [name] has malaise following chemotherapy. ? ?Objective: ? ?Vital signs in last 24 hours: ? ?Blood pressure 120/76, pulse 78, temperature 98.1 ?F (36.7 ?C), temperature source Oral, resp. rate 18, height '5\' 2"'  (1.575 m), weight 152 lb 12.8 oz (69.3 kg), SpO2 98 %. ?  ? ?HEENT: No thrush or ulcers ?Resp: Scattered end inspiratory fine rales, no respiratory distress ?Cardio: Regular rate and rhythm ?GI: Nontender, no mass, no apparent ascites, no hepatosplenomegaly ?Vascular: No leg edema ?Neuro: Mild loss of vibratory sense at the fingertips bilaterally ? ? ?Portacath/PICC-without erythema ? ?Lab Results: ? ?Lab Results  ?Component Value Date  ? WBC 4.0 01/12/2022  ? HGB 12.1 01/12/2022  ? HCT 37.1 01/12/2022  ? MCV 106.9 (H) 01/12/2022  ? PLT 223 01/12/2022  ? NEUTROABS 2.5 01/12/2022  ? ? ?CMP  ?Lab Results  ?Component Value Date  ? NA 140 01/12/2022  ? K 3.9 01/12/2022  ? CL 106 01/12/2022  ? CO2 27 01/12/2022  ? GLUCOSE 87 01/12/2022  ? BUN 25 (H) 01/12/2022  ? CREATININE 0.70 01/12/2022  ? CALCIUM 9.2 01/12/2022  ? PROT 6.4 (L) 01/12/2022  ? ALBUMIN 4.0 01/12/2022  ? AST 16 01/12/2022  ? ALT 20 01/12/2022  ? ALKPHOS 46 01/12/2022  ? BILITOT 0.4 01/12/2022  ? GFRNONAA >60 01/12/2022  ? GFRAA >60 03/13/2020  ? ? ?Lab Results  ?Component Value Date  ? CEA 0.8 07/13/2012  ? CA125 8 08/18/2016  ? ? ? ?Medications: I have reviewed the patient's current medications. ? ? 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 A 5 mm nodules remain on the right diaphragm. - ?TumorNext paired germline/tumor analyses: No somatic variants detected, germline CHEK2      VUS      ?Cycle 1 of adjuvant Taxol/carboplatin chemotherapy initiated on 09/19/2012.   ?The CA 125 normalized.   ?[name] [name] completed day 15 of cycle 6 on 02/06/2013.   ?Restaging CT evaluation 03/29/2013 showed no evidence of metastatic disease in the chest. There was marked improvement in appearance/resolution of previous described peritoneal/omental disease. There was no convincing evidence of residual disease. There was minimal increased density in the region of the omentum favored to be treatment-related. There was no pelvic adenopathy. ?CA125 3.2 on 02/19/2014. ?08/18/2016 CA-125 8 ?CT abdomen/pelvis 08/25/2016-solitary new enlarged right external iliac lymph node measuring 2.2 cm. ?Biopsy 09/07/2016-adenocarcinoma consistent with high-grade serous carcinoma. ?PET scan 09/22/2016-malignant range FDG uptake associated with the enlarged right external iliac lymph node compatible with metastatic adenopathy.  No additional sites of metastatic disease identified. ?Radiation 10/25/2016-12/01/2016 ?PET scan 03/28/2017-previously enlarged hypermetabolic right external iliac node-normal in size with resolution of hypermetabolic activity, no active malignancy identified ?PET scan 05/15/2018-no evidence of recurrent or metastatic disease, no hypermetabolic lymph nodes ?CT abdomen/pelvis 09/19/2019-mild increase in the size of several left upper quadrant peritoneal nodules measuring up to 11 mm.  No other sites of metastatic disease identified.  No ascites. ?CT abdomen/pelvis 03/14/2020-slight enlargement of several left upper quadrant peritoneal nodules, no ascites, no  other evidence of disease progression ?CT abdomen/pelvis 09/12/2020 peritoneal nodularity/omental caking predominantly in the left upper/mid  abdomen, mildly progressive.  Largest implant in the left upper abdomen adjacent to the stomach now measures 17 mm, previously 13 mm.  Overall volume of peritoneal disease has mildly progressed. ?CT abdomen/pelvis 01/13/2021- 4 mm right ureteral calculus with moderate right hydronephrosis and upper right hydroureter, progressive omental nodularity, stable calcified and partially calcified right pelvic nodules ?CT abdomen/pelvis 05/13/2021-enlargement of left upper quadrant omental nodules and a peritoneal nodule at the right iliac fossa, no ascites ?CT abdomen/pelvis 08/14/2021-mild increase in size of omental nodules, no ascites, no new site of metastatic disease ?Taxol/carboplatin 09/09/2021, 09/15/2021, 09/22/2021 ?Taxol/carboplatin 10/07/2021, 10/13/2021, 10/20/2021 ?Taxol/Carboplatin 11/03/2021, 11/10/2021, 11/17/2021 ?CT 11/23/2021-decrease in peritoneal metastases, no new or progressive disease, airspace opacity at both lung bases ?Taxol/carboplatin 12/01/2021, 12/08/2021, 12/15/2021 ?Taxol/Carboplatin every 2 weeks beginning 12/29/2021 ?  ?2. Low abdomen/suprapubic pain prior to the exploratory laparotomy-likely secondary to omental/pelvic tumor; persistent mild pain in the lower abdomen   ?3. Chronic neck and back pain.   ?4. Anxiety -persistent despite Lexapro and Xanax. [name] [name] has been evaluated by psychiatry. Currently on Lexapro. ?5. Status post Port-A-Cath placement 09/22/2012. The Port-A-Cath was removed on 04/03/2013.   ?6. Neutropenia secondary to chemotherapy- day 15 cycle 1 and cycle 3. Taxol/carboplatin not given secondary to neutropenia.    ?7. Herpes zoster involving a right thoracic dermatome July 2015. [name] [name] completed a course of Valtrex. ?8. Nodular bony prominence at the left pelvis on rectal exam 06/05/2015-likely a benign finding ?9.  Right ureter stone/hydroureteronephrosis on CT 01/13/2021-referred to urology; status post right ureteroscopy with stone extraction and ureteral stent placement.  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  ? ? ?Disposition: ?[name] [name] appears stable.  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  We will continue monitoring the neuropathy symptoms with the plan to discontinue Taxol if there is worsening. ? ?[name] [name] will complete another cycle of Taxol/carboplatin today.  [name] [name] will return for an office visit and chemotherapy in 2 weeks.  [name] [name] will be scheduled for a restaging CT in May. ? ?[name], MD ? ?01/12/2022  ?10:44 AM ? ? ?

## 2022-01-12 NOTE — Progress Notes (Signed)
Patient seen by Dr. Sherrill today ? ?Vitals are within treatment parameters. ? ?Labs reviewed by Dr. Sherrill and are within treatment parameters. ? ?Per physician team, patient is ready for treatment and there are NO modifications to the treatment plan.  ?

## 2022-01-12 NOTE — Progress Notes (Signed)
Patient presents for treatment. RN assessment completed along with the following: ? ?Labs/vitals reviewed - Yes, and within treatment parameters.   ?Weight within 10% of previous measurement - Yes ?Informed consent completed and reflects current therapy/intent - Yes, on date 09/09/21             ?Provider progress note reviewed - Today's provider note is not yet available. I reviewed the most recent oncology provider progress note in chart dated 12/29/21. ?Treatment/Antibody/Supportive plan reviewed - Yes, and there are no adjustments needed for today's treatment. ?S&H and other orders reviewed - Yes, and there are no additional orders identified. ?Previous treatment date reviewed - Yes, and the appropriate amount of time has elapsed between treatments. ?Clinic Hand Off Received from - Merceda Elks, RN ? ?Patient to proceed with treatment.   ?

## 2022-01-12 NOTE — Patient Instructions (Signed)
[facility]   ?Discharge Instructions: ?Thank you for choosing [facility] to provide your oncology and hematology care.  ? ?If you have a lab appointment with the [facility], please go directly to the [facility] and check in at the registration area. ?  ?Wear comfortable clothing and clothing appropriate for easy access to any Portacath or PICC line.  ? ?We strive to give you quality time with your provider. You may need to reschedule your appointment if you arrive late (15 or more minutes).  Arriving late affects you and other patients whose appointments are after yours.  Also, if you miss three or more appointments without notifying the office, you may be dismissed from the clinic at the provider?s discretion.    ?  ?For prescription refill requests, have your pharmacy contact our office and allow 72 hours for refills to be completed.   ? ?Today you received the following chemotherapy and/or immunotherapy agents Paclitaxel, Carboplatin    ?  ?To help prevent nausea and vomiting after your treatment, we encourage you to take your nausea medication as directed. ? ?BELOW ARE SYMPTOMS THAT SHOULD BE REPORTED IMMEDIATELY: ?*FEVER GREATER THAN 100.4 F (38 ?C) OR HIGHER ?*CHILLS OR SWEATING ?*NAUSEA AND VOMITING THAT IS NOT CONTROLLED WITH YOUR NAUSEA MEDICATION ?*UNUSUAL SHORTNESS OF BREATH ?*UNUSUAL BRUISING OR BLEEDING ?*URINARY PROBLEMS (pain or burning when urinating, or frequent urination) ?*BOWEL PROBLEMS (unusual diarrhea, constipation, pain near the anus) ?TENDERNESS IN MOUTH AND THROAT WITH OR WITHOUT PRESENCE OF ULCERS (sore throat, sores in mouth, or a toothache) ?UNUSUAL RASH, SWELLING OR PAIN  ?UNUSUAL VAGINAL DISCHARGE OR ITCHING  ? ?Items with * indicate a potential emergency and should be followed up as soon as possible or go to the Emergency Department if any problems should occur. ? ?Please show the CHEMOTHERAPY ALERT CARD or IMMUNOTHERAPY ALERT CARD at  check-in to the Emergency Department and triage nurse. ? ?Should you have questions after your visit or need to cancel or reschedule your appointment, please contact [facility]  Dept: [phone]  and follow the prompts.  Office hours are 8:00 a.m. to 4:30 p.m. Monday - Friday. Please note that voicemails left after 4:00 p.m. may not be returned until the following business day.  We are closed weekends and major holidays. You have access to a nurse at all times for urgent questions. Please call the main number to the clinic Dept: [phone] and follow the prompts. ? ? ?For any non-urgent questions, you may also contact your provider using MyChart. We now offer e-Visits for anyone 63 and older to request care online for non-urgent symptoms. For details visit [email or web address]. ?  ?Also download the MyChart app! Go to the app store, search "MyChart", open the app, select [facility], and log in with your MyChart username and password. ? ?Due to Covid, a mask is required upon entering the hospital/clinic. If you do not have a mask, one will be given to you upon arrival. For doctor visits, patients may have 1 support person aged 3 or older with them. For treatment visits, patients cannot have anyone with them due to current Covid guidelines and our immunocompromised population.  ? ?Paclitaxel injection ?What is this medication? ?PACLITAXEL (PAK li TAX el) is a chemotherapy drug. It targets fast dividing cells, like cancer cells, and causes these cells to die. This medicine is used to treat ovarian cancer, breast cancer, lung cancer, Kaposi's sarcoma, and other cancers. ?This  medicine may be used for other purposes; ask your health care provider or pharmacist if you have questions. ?COMMON BRAND NAME(S): Onxol, Taxol ?What should I tell my care team before I take this medication? ?They need to know if you have any of these conditions: ?history of irregular heartbeat ?liver  disease ?low blood counts, like low white cell, platelet, or red cell counts ?lung or breathing disease, like asthma ?tingling of the fingers or toes, or other nerve disorder ?an unusual or allergic reaction to paclitaxel, alcohol, polyoxyethylated castor oil, other chemotherapy, other medicines, foods, dyes, or preservatives ?pregnant or trying to get pregnant ?breast-feeding ?How should I use this medication? ?This drug is given as an infusion into a vein. It is administered in a hospital or clinic by a specially trained health care professional. ?Talk to your pediatrician regarding the use of this medicine in children. Special care may be needed. ?Overdosage: If you think you have taken too much of this medicine contact a poison control center or emergency room at once. ?NOTE: This medicine is only for you. Do not share this medicine with others. ?What if I miss a dose? ?It is important not to miss your dose. Call your doctor or health care professional if you are unable to keep an appointment. ?What may interact with this medication? ?Do not take this medicine with any of the following medications: ?live virus vaccines ?This medicine may also interact with the following medications: ?antiviral medicines for hepatitis, HIV or AIDS ?certain antibiotics like erythromycin and clarithromycin ?certain medicines for fungal infections like ketoconazole and itraconazole ?certain medicines for seizures like carbamazepine, phenobarbital, phenytoin ?gemfibrozil ?nefazodone ?rifampin ?St. John's wort ?This list may not describe all possible interactions. Give your health care provider a list of all the medicines, herbs, non-prescription drugs, or dietary supplements you use. Also tell them if you smoke, drink alcohol, or use illegal drugs. Some items may interact with your medicine. ?What should I watch for while using this medication? ?Your condition will be monitored carefully while you are receiving this medicine. You  will need important blood work done while you are taking this medicine. ?This medicine can cause serious allergic reactions. To reduce your risk you will need to take other medicine(s) before treatment with this medicine. If you experience allergic reactions like skin rash, itching or hives, swelling of the face, lips, or tongue, tell your doctor or health care professional right away. ?In some cases, you may be given additional medicines to help with side effects. Follow all directions for their use. ?This drug may make you feel generally unwell. This is not uncommon, as chemotherapy can affect healthy cells as well as cancer cells. Report any side effects. Continue your course of treatment even though you feel ill unless your doctor tells you to stop. ?Call your doctor or health care professional for advice if you get a fever, chills or sore throat, or other symptoms of a cold or flu. Do not treat yourself. This drug decreases your body's ability to fight infections. Try to avoid being around people who are sick. ?This medicine may increase your risk to bruise or bleed. Call your doctor or health care professional if you notice any unusual bleeding. ?Be careful brushing and flossing your teeth or using a toothpick because you may get an infection or bleed more easily. If you have any dental work done, tell your dentist you are receiving this medicine. ?Avoid taking products that contain aspirin, acetaminophen, ibuprofen, naproxen, or ketoprofen unless instructed  by your doctor. These medicines may hide a fever. ?Do not become pregnant while taking this medicine. Women should inform their doctor if they wish to become pregnant or think they might be pregnant. There is a potential for serious side effects to an unborn child. Talk to your health care professional or pharmacist for more information. Do not breast-feed an infant while taking this medicine. ?Men are advised not to father a child while receiving this  medicine. ?This product may contain alcohol. Ask your pharmacist or healthcare provider if this medicine contains alcohol. Be sure to tell all healthcare providers you are taking this medicine. Certain medicines, lik

## 2022-01-13 LAB — CA 125: Cancer Antigen (CA) 125: 14.6 U/mL (ref 0.0–38.1)

## 2022-01-23 ENCOUNTER — Other Ambulatory Visit: Payer: Self-pay | Admitting: Oncology

## 2022-01-25 DIAGNOSIS — M5136 Other intervertebral disc degeneration, lumbar region: Secondary | ICD-10-CM | POA: Diagnosis not present

## 2022-01-25 DIAGNOSIS — M5416 Radiculopathy, lumbar region: Secondary | ICD-10-CM | POA: Diagnosis not present

## 2022-01-25 DIAGNOSIS — M419 Scoliosis, unspecified: Secondary | ICD-10-CM | POA: Diagnosis not present

## 2022-01-25 DIAGNOSIS — M5451 Vertebrogenic low back pain: Secondary | ICD-10-CM | POA: Diagnosis not present

## 2022-01-26 ENCOUNTER — Inpatient Hospital Stay: Payer: Medicare Other

## 2022-01-26 ENCOUNTER — Encounter: Payer: Self-pay | Admitting: *Deleted

## 2022-01-26 ENCOUNTER — Encounter: Payer: Self-pay | Admitting: Nurse Practitioner

## 2022-01-26 ENCOUNTER — Inpatient Hospital Stay: Payer: Medicare Other | Admitting: Nurse Practitioner

## 2022-01-26 VITALS — BP 119/73 | HR 88 | Temp 98.1°F | Resp 18 | Ht 62.0 in | Wt 151.4 lb

## 2022-01-26 DIAGNOSIS — Z5111 Encounter for antineoplastic chemotherapy: Secondary | ICD-10-CM | POA: Diagnosis not present

## 2022-01-26 DIAGNOSIS — M542 Cervicalgia: Secondary | ICD-10-CM | POA: Diagnosis not present

## 2022-01-26 DIAGNOSIS — G629 Polyneuropathy, unspecified: Secondary | ICD-10-CM | POA: Diagnosis not present

## 2022-01-26 DIAGNOSIS — C569 Malignant neoplasm of unspecified ovary: Secondary | ICD-10-CM

## 2022-01-26 DIAGNOSIS — D701 Agranulocytosis secondary to cancer chemotherapy: Secondary | ICD-10-CM | POA: Diagnosis not present

## 2022-01-26 DIAGNOSIS — M549 Dorsalgia, unspecified: Secondary | ICD-10-CM | POA: Diagnosis not present

## 2022-01-26 DIAGNOSIS — N132 Hydronephrosis with renal and ureteral calculous obstruction: Secondary | ICD-10-CM | POA: Diagnosis not present

## 2022-01-26 DIAGNOSIS — Z90722 Acquired absence of ovaries, bilateral: Secondary | ICD-10-CM | POA: Diagnosis not present

## 2022-01-26 DIAGNOSIS — Z79899 Other long term (current) drug therapy: Secondary | ICD-10-CM | POA: Diagnosis not present

## 2022-01-26 DIAGNOSIS — C786 Secondary malignant neoplasm of retroperitoneum and peritoneum: Secondary | ICD-10-CM | POA: Diagnosis not present

## 2022-01-26 DIAGNOSIS — G8929 Other chronic pain: Secondary | ICD-10-CM | POA: Diagnosis not present

## 2022-01-26 DIAGNOSIS — T451X5A Adverse effect of antineoplastic and immunosuppressive drugs, initial encounter: Secondary | ICD-10-CM | POA: Diagnosis not present

## 2022-01-26 DIAGNOSIS — F419 Anxiety disorder, unspecified: Secondary | ICD-10-CM | POA: Diagnosis not present

## 2022-01-26 LAB — CBC WITH DIFFERENTIAL (CANCER CENTER ONLY)
Abs Immature Granulocytes: 0.01 10*3/uL (ref 0.00–0.07)
Basophils Absolute: 0 10*3/uL (ref 0.0–0.1)
Basophils Relative: 1 %
Eosinophils Absolute: 0.1 10*3/uL (ref 0.0–0.5)
Eosinophils Relative: 2 %
HCT: 39 % (ref 36.0–46.0)
Hemoglobin: 12.7 g/dL (ref 12.0–15.0)
Immature Granulocytes: 0 %
Lymphocytes Relative: 19 %
Lymphs Abs: 0.6 10*3/uL — ABNORMAL LOW (ref 0.7–4.0)
MCH: 34.7 pg — ABNORMAL HIGH (ref 26.0–34.0)
MCHC: 32.6 g/dL (ref 30.0–36.0)
MCV: 106.6 fL — ABNORMAL HIGH (ref 80.0–100.0)
Monocytes Absolute: 0.7 10*3/uL (ref 0.1–1.0)
Monocytes Relative: 20 %
Neutro Abs: 1.9 10*3/uL (ref 1.7–7.7)
Neutrophils Relative %: 58 %
Platelet Count: 241 10*3/uL (ref 150–400)
RBC: 3.66 MIL/uL — ABNORMAL LOW (ref 3.87–5.11)
RDW: 14.9 % (ref 11.5–15.5)
WBC Count: 3.3 10*3/uL — ABNORMAL LOW (ref 4.0–10.5)
nRBC: 0 % (ref 0.0–0.2)

## 2022-01-26 LAB — CMP (CANCER CENTER ONLY)
ALT: 19 U/L (ref 0–44)
AST: 18 U/L (ref 15–41)
Albumin: 4.1 g/dL (ref 3.5–5.0)
Alkaline Phosphatase: 49 U/L (ref 38–126)
Anion gap: 10 (ref 5–15)
BUN: 25 mg/dL — ABNORMAL HIGH (ref 8–23)
CO2: 27 mmol/L (ref 22–32)
Calcium: 9.9 mg/dL (ref 8.9–10.3)
Chloride: 104 mmol/L (ref 98–111)
Creatinine: 0.63 mg/dL (ref 0.44–1.00)
GFR, Estimated: >60 mL/min (ref >60)
Glucose, Bld: 95 mg/dL (ref 70–99)
Potassium: 4 mmol/L (ref 3.5–5.1)
Sodium: 141 mmol/L (ref 135–145)
Total Bilirubin: 0.4 mg/dL (ref 0.3–1.2)
Total Protein: 6.7 g/dL (ref 6.5–8.1)

## 2022-01-26 MED ORDER — SODIUM CHLORIDE 0.9 % IV SOLN: Freq: Once | INTRAVENOUS | Status: AC

## 2022-01-26 MED ORDER — FAMOTIDINE IN NACL 20-0.9 MG/50ML-% IV SOLN
20.0000 mg | Freq: Once | INTRAVENOUS | Status: AC
  Administered 2022-01-26: 20 mg via INTRAVENOUS
  Filled 2022-01-26: qty 50

## 2022-01-26 MED ORDER — SODIUM CHLORIDE 0.9% FLUSH
10.0000 mL | INTRAVENOUS | Status: DC | PRN
  Administered 2022-01-26: 10 mL

## 2022-01-26 MED ORDER — PALONOSETRON HCL INJECTION 0.25 MG/5ML
0.2500 mg | Freq: Once | INTRAVENOUS | Status: AC
  Administered 2022-01-26: 0.25 mg via INTRAVENOUS
  Filled 2022-01-26: qty 5

## 2022-01-26 MED ORDER — HEPARIN SOD (PORK) LOCK FLUSH 100 UNIT/ML IV SOLN
500.0000 [IU] | Freq: Once | INTRAVENOUS | Status: AC | PRN
  Administered 2022-01-26: 500 [IU]

## 2022-01-26 MED ORDER — SODIUM CHLORIDE 0.9 % IV SOLN
157.8000 mg | Freq: Once | INTRAVENOUS | Status: AC
  Administered 2022-01-26: 160 mg via INTRAVENOUS
  Filled 2022-01-26: qty 16

## 2022-01-26 MED ORDER — DIPHENHYDRAMINE HCL 50 MG/ML IJ SOLN
25.0000 mg | Freq: Once | INTRAMUSCULAR | Status: AC
  Administered 2022-01-26: 25 mg via INTRAVENOUS
  Filled 2022-01-26: qty 1

## 2022-01-26 MED ORDER — SODIUM CHLORIDE 0.9 % IV SOLN
10.0000 mg | Freq: Once | INTRAVENOUS | Status: AC
  Administered 2022-01-26: 10 mg via INTRAVENOUS
  Filled 2022-01-26: qty 1

## 2022-01-26 NOTE — Patient Instructions (Signed)

## 2022-01-26 NOTE — Progress Notes (Signed)
Patient seen by Ned Card NP today ? ?Vitals are within treatment parameters. ? ?Labs reviewed by Ned Card NP and are within treatment parameters. ? ?Per physician team, patient is ready for treatment. Please note that modifications are being made to the treatment plan including Taxol on hold today due to neuropathy--will receive Carboplatin only. Pharmacy to adjust premeds.   ?

## 2022-01-26 NOTE — Progress Notes (Signed)
?[name] [name] ?OFFICE PROGRESS NOTE ? ? ?Diagnosis: Ovarian cancer ? ?INTERVAL HISTORY:  ? ?[name] [name] returns as scheduled.  She completed another cycle of Taxol/carboplatin 01/12/2022.  She denies nausea/vomiting.  No mouth sores.  No diarrhea or constipation.  She has noted increased tingling in the fingertips, present consistently.  Mild numbness.  Some interference with activity such as tying her shoes.  She is considering a back injection due to pain/radicular symptoms. ? ?Objective: ? ?Vital signs in last 24 hours: ? ?Blood pressure 119/73, pulse 88, temperature 98.1 ?F (36.7 ?C), temperature source Oral, resp. rate 18, height '5\' 2"'  (1.575 m), weight 151 lb 6.4 oz (68.7 kg), SpO2 100 %. ?  ? ?HEENT: No thrush or ulcers. ?Resp: Lungs clear bilaterally. ?Cardio: Regular rate and rhythm. ?GI: Abdomen soft and nontender.  No hepatosplenomegaly. ?Vascular: No leg edema. ?Neuro: Mild decrease in vibratory sense over the fingertips per tuning fork exam.  ?Port-A-Cath without erythema. ? ?Lab Results: ? ?Lab Results  ?Component Value Date  ? WBC 3.3 (L) 01/26/2022  ? HGB 12.7 01/26/2022  ? HCT 39.0 01/26/2022  ? MCV 106.6 (H) 01/26/2022  ? PLT 241 01/26/2022  ? NEUTROABS 1.9 01/26/2022  ? ? ?Imaging: ? ?No results found. ? ?Medications: I have reviewed the patient's current medications. ? 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 A 5 mm nodules remain on the right diaphragm. - ?TumorNext paired germline/tumor analyses: No somatic variants detected, germline CHEK2      VUS      ?Cycle 1 of adjuvant Taxol/carboplatin chemotherapy initiated on 09/19/2012.   ?The CA 125 normalized.   ?She completed day 15 of cycle 6 on 02/06/2013.   ?Restaging CT evaluation 03/29/2013 showed no evidence of metastatic disease in the chest. There was marked  improvement in appearance/resolution of previous described peritoneal/omental disease. There was no convincing evidence of residual disease. There was minimal increased density in the region of the omentum favored to be treatment-related. There was no pelvic adenopathy. ?CA125 3.2 on 02/19/2014. ?08/18/2016 CA-125 8 ?CT abdomen/pelvis 08/25/2016-solitary new enlarged right external iliac lymph node measuring 2.2 cm. ?Biopsy 09/07/2016-adenocarcinoma consistent with high-grade serous carcinoma. ?PET scan 09/22/2016-malignant range FDG uptake associated with the enlarged right external iliac lymph node compatible with metastatic adenopathy.  No additional sites of metastatic disease identified. ?Radiation 10/25/2016-12/01/2016 ?PET scan 03/28/2017-previously enlarged hypermetabolic right external iliac node-normal in size with resolution of hypermetabolic activity, no active malignancy identified ?PET scan 05/15/2018-no evidence of recurrent or metastatic disease, no hypermetabolic lymph nodes ?CT abdomen/pelvis 09/19/2019-mild increase in the size of several left upper quadrant peritoneal nodules measuring up to 11 mm.  No other sites of metastatic disease identified.  No ascites. ?CT abdomen/pelvis 03/14/2020-slight enlargement of several left upper quadrant peritoneal nodules, no ascites, no other evidence of disease progression ?CT abdomen/pelvis 09/12/2020 peritoneal nodularity/omental caking predominantly in the left upper/mid abdomen, mildly progressive.  Largest implant in the left upper abdomen adjacent to the stomach now measures 17 mm, previously 13 mm.  Overall volume of peritoneal disease has mildly progressed. ?CT abdomen/pelvis 01/13/2021- 4 mm right ureteral calculus with moderate right hydronephrosis and upper right hydroureter, progressive omental nodularity, stable calcified and partially calcified right pelvic nodules ?CT abdomen/pelvis 05/13/2021-enlargement of left upper quadrant omental nodules and a  peritoneal nodule at the right iliac fossa, no ascites ?CT abdomen/pelvis 08/14/2021-mild increase in size of omental nodules, no ascites,  no new site of metastatic disease ?Taxol/carboplatin 09/09/2021, 09/15/2021, 09/22/2021 ?Taxol/carboplatin 10/07/2021, 10/13/2021, 10/20/2021 ?Taxol/Carboplatin 11/03/2021, 11/10/2021, 11/17/2021 ?CT 11/23/2021-decrease in peritoneal metastases, no new or progressive disease, airspace opacity at both lung bases ?Taxol/carboplatin 12/01/2021, 12/08/2021, 12/15/2021 ?Taxol/Carboplatin every 2 weeks beginning 12/29/2021 ?Carboplatin alone 01/26/2022, Taxol held due to neuropathy ?  ?2. Low abdomen/suprapubic pain prior to the exploratory laparotomy-likely secondary to omental/pelvic tumor; persistent mild pain in the lower abdomen   ?3. Chronic neck and back pain.   ?4. Anxiety -persistent despite Lexapro and Xanax. She has been evaluated by psychiatry. Currently on Lexapro. ?5. Status post Port-A-Cath placement 09/22/2012. The Port-A-Cath was removed on 04/03/2013.   ?6. Neutropenia secondary to chemotherapy- day 15 cycle 1 and cycle 3. Taxol/carboplatin not given secondary to neutropenia.    ?7. Herpes zoster involving a right thoracic dermatome July 2015. She completed a course of Valtrex. ?8. Nodular bony prominence at the left pelvis on rectal exam 06/05/2015-likely a benign finding ?9.  Right ureter stone/hydroureteronephrosis on CT 01/13/2021-referred to urology; status post right ureteroscopy with stone extraction and ureteral stent placement.  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 ?Disposition: [name] [name] appears stable.  She is currently on active treatment with Taxol/carboplatin every 2 weeks.  She has developed neuropathy symptoms, interfering with activity.  We decided to hold Taxol today and proceed with carboplatin alone.  She agrees with this plan.  We will reevaluate in 2 weeks and potentially resume Taxol if symptoms are better. ? ?CBC and chemistry panel reviewed.  Labs  adequate to proceed with Carboplatin as above. ? ?She is considering a back injection.  She will make sure a CBC is checked prior to the injection. ? ?She will return for lab, follow-up, chemotherapy in 2 weeks.  We are available to see her sooner if needed. ? ?Plan reviewed with Dr. Benay Spice. ? ? ? ?Ned Card ANP/GNP-BC  ? ?01/26/2022  ?8:42 AM ? ? ? ? ? ? ? ?

## 2022-01-26 NOTE — Patient Instructions (Addendum)
Carboplatin injection ?What is this medication? ?CARBOPLATIN (KAR boe pla tin) is a chemotherapy drug. It targets fast dividing cells, like cancer cells, and causes these cells to die. This medicine is used to treat ovarian cancer and many other cancers. ?This medicine may be used for other purposes; ask your health care provider or pharmacist if you have questions. ?COMMON BRAND NAME(S): Paraplatin ?What should I tell my care team before I take this medication? ?They need to know if you have any of these conditions: ?blood disorders ?hearing problems ?kidney disease ?recent or ongoing radiation therapy ?an unusual or allergic reaction to carboplatin, cisplatin, other chemotherapy, other medicines, foods, dyes, or preservatives ?pregnant or trying to get pregnant ?breast-feeding ?How should I use this medication? ?This drug is usually given as an infusion into a vein. It is administered in a hospital or clinic by a specially trained health care professional. ?Talk to your pediatrician regarding the use of this medicine in children. Special care may be needed. ?Overdosage: If you think you have taken too much of this medicine contact a poison control center or emergency room at once. ?NOTE: This medicine is only for you. Do not share this medicine with others. ?What if I miss a dose? ?It is important not to miss a dose. Call your doctor or health care professional if you are unable to keep an appointment. ?What may interact with this medication? ?medicines for seizures ?medicines to increase blood counts like filgrastim, pegfilgrastim, sargramostim ?some antibiotics like amikacin, gentamicin, neomycin, streptomycin, tobramycin ?vaccines ?Talk to your doctor or health care professional before taking any of these medicines: ?acetaminophen ?aspirin ?ibuprofen ?ketoprofen ?naproxen ?This list may not describe all possible interactions. Give your health care provider a list of all the medicines, herbs, non-prescription  drugs, or dietary supplements you use. Also tell them if you smoke, drink alcohol, or use illegal drugs. Some items may interact with your medicine. ?What should I watch for while using this medication? ?Your condition will be monitored carefully while you are receiving this medicine. You will need important blood work done while you are taking this medicine. ?This drug may make you feel generally unwell. This is not uncommon, as chemotherapy can affect healthy cells as well as cancer cells. Report any side effects. Continue your course of treatment even though you feel ill unless your doctor tells you to stop. ?In some cases, you may be given additional medicines to help with side effects. Follow all directions for their use. ?Call your doctor or health care professional for advice if you get a fever, chills or sore throat, or other symptoms of a cold or flu. Do not treat yourself. This drug decreases your body's ability to fight infections. Try to avoid being around people who are sick. ?This medicine may increase your risk to bruise or bleed. Call your doctor or health care professional if you notice any unusual bleeding. ?Be careful brushing and flossing your teeth or using a toothpick because you may get an infection or bleed more easily. If you have any dental work done, tell your dentist you are receiving this medicine. ?Avoid taking products that contain aspirin, acetaminophen, ibuprofen, naproxen, or ketoprofen unless instructed by your doctor. These medicines may hide a fever. ?Do not become pregnant while taking this medicine. Women should inform their doctor if they wish to become pregnant or think they might be pregnant. There is a potential for serious side effects to an unborn child. Talk to your health care professional or pharmacist  for more information. Do not breast-feed an infant while taking this medicine. ?What side effects may I notice from receiving this medication? ?Side effects that you  should report to your doctor or health care professional as soon as possible: ?allergic reactions like skin rash, itching or hives, swelling of the face, lips, or tongue ?signs of infection - fever or chills, cough, sore throat, pain or difficulty passing urine ?signs of decreased platelets or bleeding - bruising, pinpoint red spots on the skin, black, tarry stools, nosebleeds ?signs of decreased red blood cells - unusually weak or tired, fainting spells, lightheadedness ?breathing problems ?changes in hearing ?changes in vision ?chest pain ?high blood pressure ?low blood counts - This drug may decrease the number of white blood cells, red blood cells and platelets. You may be at increased risk for infections and bleeding. ?nausea and vomiting ?pain, swelling, redness or irritation at the injection site ?pain, tingling, numbness in the hands or feet ?problems with balance, talking, walking ?trouble passing urine or change in the amount of urine ?Side effects that usually do not require medical attention (report to your doctor or health care professional if they continue or are bothersome): ?hair loss ?loss of appetite ?metallic taste in the mouth or changes in taste ?This list may not describe all possible side effects. Call your doctor for medical advice about side effects. You may report side effects to FDA at 1-800-FDA-1088. ?Where should I keep my medication? ?This drug is given in a hospital or clinic and will not be stored at home. ?NOTE: This sheet is a summary. It may not cover all possible information. If you have questions about this medicine, talk to your doctor, pharmacist, or health care provider. ?? 2023 Elsevier/Gold Standard (2008-02-28 00:00:00) ? ?

## 2022-01-26 NOTE — Progress Notes (Signed)
Patient presents for treatment. RN assessment completed along with the following: ? ?Labs/vitals reviewed - Yes, and within treatment parameters.   ?Weight within 10% of previous measurement - Yes ?Informed consent completed and reflects current therapy/intent - Yes, on date 09/09/2021             ?Provider progress note reviewed - Yes, today's provider note was reviewed. ?Treatment/Antibody/Supportive plan reviewed - Yes, and Taxol held due to neuropathy per Ned Card NP ?S&H and other orders reviewed - Yes, and there are no additional orders identified. ?Previous treatment date reviewed - Yes, and the appropriate amount of time has elapsed between treatments. ?Clinic Hand Off Received from - [facility] ? ?Patient to proceed with treatment.   ?

## 2022-02-05 ENCOUNTER — Other Ambulatory Visit: Payer: Self-pay | Admitting: Oncology

## 2022-02-09 ENCOUNTER — Encounter: Payer: Self-pay | Admitting: *Deleted

## 2022-02-09 ENCOUNTER — Inpatient Hospital Stay: Payer: Medicare Other

## 2022-02-09 ENCOUNTER — Encounter: Payer: Self-pay | Admitting: Nurse Practitioner

## 2022-02-09 ENCOUNTER — Inpatient Hospital Stay: Payer: Medicare Other | Admitting: Nurse Practitioner

## 2022-02-09 ENCOUNTER — Inpatient Hospital Stay: Payer: Medicare Other | Attending: Oncology

## 2022-02-09 VITALS — BP 118/71 | HR 82 | Temp 98.1°F | Resp 18 | Ht 62.0 in | Wt 153.0 lb

## 2022-02-09 DIAGNOSIS — G620 Drug-induced polyneuropathy: Secondary | ICD-10-CM | POA: Insufficient documentation

## 2022-02-09 DIAGNOSIS — C786 Secondary malignant neoplasm of retroperitoneum and peritoneum: Secondary | ICD-10-CM | POA: Insufficient documentation

## 2022-02-09 DIAGNOSIS — C569 Malignant neoplasm of unspecified ovary: Secondary | ICD-10-CM | POA: Diagnosis not present

## 2022-02-09 DIAGNOSIS — Z5111 Encounter for antineoplastic chemotherapy: Secondary | ICD-10-CM | POA: Insufficient documentation

## 2022-02-09 DIAGNOSIS — D701 Agranulocytosis secondary to cancer chemotherapy: Secondary | ICD-10-CM | POA: Insufficient documentation

## 2022-02-09 LAB — CBC WITH DIFFERENTIAL (CANCER CENTER ONLY)
Abs Immature Granulocytes: 0.02 10*3/uL (ref 0.00–0.07)
Basophils Absolute: 0 10*3/uL (ref 0.0–0.1)
Basophils Relative: 1 %
Eosinophils Absolute: 0.1 10*3/uL (ref 0.0–0.5)
Eosinophils Relative: 1 %
HCT: 38 % (ref 36.0–46.0)
Hemoglobin: 12.3 g/dL (ref 12.0–15.0)
Immature Granulocytes: 0 %
Lymphocytes Relative: 10 %
Lymphs Abs: 0.7 10*3/uL (ref 0.7–4.0)
MCH: 34.3 pg — ABNORMAL HIGH (ref 26.0–34.0)
MCHC: 32.4 g/dL (ref 30.0–36.0)
MCV: 105.8 fL — ABNORMAL HIGH (ref 80.0–100.0)
Monocytes Absolute: 0.8 10*3/uL (ref 0.1–1.0)
Monocytes Relative: 11 %
Neutro Abs: 5.5 10*3/uL (ref 1.7–7.7)
Neutrophils Relative %: 77 %
Platelet Count: 210 10*3/uL (ref 150–400)
RBC: 3.59 MIL/uL — ABNORMAL LOW (ref 3.87–5.11)
RDW: 14.3 % (ref 11.5–15.5)
WBC Count: 7.2 10*3/uL (ref 4.0–10.5)
nRBC: 0 % (ref 0.0–0.2)

## 2022-02-09 LAB — CMP (CANCER CENTER ONLY)
ALT: 18 U/L (ref 0–44)
AST: 17 U/L (ref 15–41)
Albumin: 3.9 g/dL (ref 3.5–5.0)
Alkaline Phosphatase: 48 U/L (ref 38–126)
Anion gap: 9 (ref 5–15)
BUN: 25 mg/dL — ABNORMAL HIGH (ref 8–23)
CO2: 27 mmol/L (ref 22–32)
Calcium: 9.2 mg/dL (ref 8.9–10.3)
Chloride: 104 mmol/L (ref 98–111)
Creatinine: 0.69 mg/dL (ref 0.44–1.00)
GFR, Estimated: >60 mL/min (ref >60)
Glucose, Bld: 89 mg/dL (ref 70–99)
Potassium: 4.2 mmol/L (ref 3.5–5.1)
Sodium: 140 mmol/L (ref 135–145)
Total Bilirubin: 0.5 mg/dL (ref 0.3–1.2)
Total Protein: 6.5 g/dL (ref 6.5–8.1)

## 2022-02-09 MED ORDER — SODIUM CHLORIDE 0.9 % IV SOLN: Freq: Once | INTRAVENOUS | Status: AC

## 2022-02-09 MED ORDER — HEPARIN SOD (PORK) LOCK FLUSH 100 UNIT/ML IV SOLN
500.0000 [IU] | Freq: Once | INTRAVENOUS | Status: AC | PRN
  Administered 2022-02-09: 500 [IU]

## 2022-02-09 MED ORDER — SODIUM CHLORIDE 0.9% FLUSH
10.0000 mL | INTRAVENOUS | Status: DC | PRN
  Administered 2022-02-09: 10 mL

## 2022-02-09 MED ORDER — DIPHENHYDRAMINE HCL 50 MG/ML IJ SOLN
25.0000 mg | Freq: Once | INTRAMUSCULAR | Status: AC
  Administered 2022-02-09: 25 mg via INTRAVENOUS
  Filled 2022-02-09: qty 1

## 2022-02-09 MED ORDER — FAMOTIDINE IN NACL 20-0.9 MG/50ML-% IV SOLN
20.0000 mg | Freq: Once | INTRAVENOUS | Status: AC
  Administered 2022-02-09: 20 mg via INTRAVENOUS
  Filled 2022-02-09: qty 50

## 2022-02-09 MED ORDER — PALONOSETRON HCL INJECTION 0.25 MG/5ML
0.2500 mg | Freq: Once | INTRAVENOUS | Status: AC
  Administered 2022-02-09: 0.25 mg via INTRAVENOUS
  Filled 2022-02-09: qty 5

## 2022-02-09 MED ORDER — SODIUM CHLORIDE 0.9 % IV SOLN
157.8000 mg | Freq: Once | INTRAVENOUS | Status: AC
  Administered 2022-02-09: 160 mg via INTRAVENOUS
  Filled 2022-02-09: qty 16

## 2022-02-09 MED ORDER — SODIUM CHLORIDE 0.9 % IV SOLN
10.0000 mg | Freq: Once | INTRAVENOUS | Status: AC
  Administered 2022-02-09: 10 mg via INTRAVENOUS
  Filled 2022-02-09: qty 10

## 2022-02-09 NOTE — Progress Notes (Signed)
Patient presents for treatment. RN assessment completed along with the following: ? ?Labs/vitals reviewed - Yes, and within treatment parameters.   ?Weight within 10% of previous measurement - Yes ?Informed consent completed and reflects current therapy/intent - Yes, on date 09/09/21             ?Provider progress note reviewed - Yes, today's provider note was reviewed. ?Treatment/Antibody/Supportive plan reviewed - Yes, and Yes, and Plan to proceed with carboplatin alone today, continue to hold Taxol. ?S&H and other orders reviewed - Yes, and there are no additional orders identified. ?Previous treatment date reviewed - Yes, and the appropriate amount of time has elapsed between treatments. ?Clinic Hand Off Received from - Cristy Friedlander, RN ? ?Patient to proceed with treatment.  ? ?

## 2022-02-09 NOTE — Progress Notes (Signed)
Patient seen by Ned Card NP today ? ?Vitals are within treatment parameters. ? ?Labs reviewed by Ned Card NP and are within treatment parameters. ? ?Per physician team, patient is ready for treatment. Please note that modifications are being made to the treatment plan including Will receive carboplatin today only. Pharmacy to adjust premeds accordingly please.   ?

## 2022-02-09 NOTE — Progress Notes (Signed)
?[facility] ?OFFICE PROGRESS NOTE ? ? ?Diagnosis: Ovarian cancer ? ?INTERVAL HISTORY:  ? ?[name] [name] returns as scheduled.  She completed a cycle of carboplatin 01/26/2022.  Taxol was held due to neuropathy.  She reports neuropathy symptoms are unchanged.  She continues to have difficulty buttoning and with shoe tying.  She is dropping things periodically.  She has consistent tingling in the fingertips.  She wonders if arthritis may be contributing as well.  No nausea or vomiting.  No mouth sores.  No significant constipation or diarrhea. ? ?Objective: ? ?Vital signs in last 24 hours: ? ?Blood pressure 118/71, pulse 82, temperature 98.1 ?F (36.7 ?C), temperature source Oral, resp. rate 18, height '5\' 2"'  (1.575 m), weight 153 lb (69.4 kg), SpO2 100 %. ?  ? ?HEENT: No thrush or ulcers. ?Resp: Lungs clear bilaterally. ?Cardio: Regular rate and rhythm. ?GI: Abdomen soft and nontender.  No hepatosplenomegaly. ?Vascular: No leg edema. ?Port-A-Cath without erythema. ? ?Lab Results: ? ?Lab Results  ?Component Value Date  ? WBC 7.2 02/09/2022  ? HGB 12.3 02/09/2022  ? HCT 38.0 02/09/2022  ? MCV 105.8 (H) 02/09/2022  ? PLT 210 02/09/2022  ? NEUTROABS 5.5 02/09/2022  ? ? ?Imaging: ? ?No results found. ? ?Medications: I have reviewed the patient's current medications. ? 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 A 5 mm nodules remain on the right diaphragm. - ?TumorNext paired germline/tumor analyses: No somatic variants detected, germline CHEK2      VUS      ?Cycle 1 of adjuvant Taxol/carboplatin chemotherapy initiated on 09/19/2012.   ?The CA 125 normalized.   ?She completed day 15 of cycle 6 on 02/06/2013.   ?Restaging CT evaluation 03/29/2013 showed no evidence of metastatic disease in the chest. There was marked improvement in  appearance/resolution of previous described peritoneal/omental disease. There was no convincing evidence of residual disease. There was minimal increased density in the region of the omentum favored to be treatment-related. There was no pelvic adenopathy. ?CA125 3.2 on 02/19/2014. ?08/18/2016 CA-125 8 ?CT abdomen/pelvis 08/25/2016-solitary new enlarged right external iliac lymph node measuring 2.2 cm. ?Biopsy 09/07/2016-adenocarcinoma consistent with high-grade serous carcinoma. ?PET scan 09/22/2016-malignant range FDG uptake associated with the enlarged right external iliac lymph node compatible with metastatic adenopathy.  No additional sites of metastatic disease identified. ?Radiation 10/25/2016-12/01/2016 ?PET scan 03/28/2017-previously enlarged hypermetabolic right external iliac node-normal in size with resolution of hypermetabolic activity, no active malignancy identified ?PET scan 05/15/2018-no evidence of recurrent or metastatic disease, no hypermetabolic lymph nodes ?CT abdomen/pelvis 09/19/2019-mild increase in the size of several left upper quadrant peritoneal nodules measuring up to 11 mm.  No other sites of metastatic disease identified.  No ascites. ?CT abdomen/pelvis 03/14/2020-slight enlargement of several left upper quadrant peritoneal nodules, no ascites, no other evidence of disease progression ?CT abdomen/pelvis 09/12/2020 peritoneal nodularity/omental caking predominantly in the left upper/mid abdomen, mildly progressive.  Largest implant in the left upper abdomen adjacent to the stomach now measures 17 mm, previously 13 mm.  Overall volume of peritoneal disease has mildly progressed. ?CT abdomen/pelvis 01/13/2021- 4 mm right ureteral calculus with moderate right hydronephrosis and upper right hydroureter, progressive omental nodularity, stable calcified and partially calcified right pelvic nodules ?CT abdomen/pelvis 05/13/2021-enlargement of left upper quadrant omental nodules and a peritoneal  nodule at the right iliac fossa, no ascites ?CT abdomen/pelvis 08/14/2021-mild increase in size of omental nodules, no ascites, no  new site of metastatic disease ?Taxol/carboplatin 09/09/2021, 09/15/2021, 09/22/2021 ?Taxol/carboplatin 10/07/2021, 10/13/2021, 10/20/2021 ?Taxol/Carboplatin 11/03/2021, 11/10/2021, 11/17/2021 ?CT 11/23/2021-decrease in peritoneal metastases, no new or progressive disease, airspace opacity at both lung bases ?Taxol/carboplatin 12/01/2021, 12/08/2021, 12/15/2021 ?Taxol/Carboplatin every 2 weeks beginning 12/29/2021 ?Carboplatin alone 01/26/2022, Taxol held due to neuropathy ?Carboplatin alone 02/09/2022, Taxol held due to neuropathy ?  ?2. Low abdomen/suprapubic pain prior to the exploratory laparotomy-likely secondary to omental/pelvic tumor; persistent mild pain in the lower abdomen   ?3. Chronic neck and back pain.   ?4. Anxiety -persistent despite Lexapro and Xanax. She has been evaluated by psychiatry. Currently on Lexapro. ?5. Status post Port-A-Cath placement 09/22/2012. The Port-A-Cath was removed on 04/03/2013.   ?6. Neutropenia secondary to chemotherapy- day 15 cycle 1 and cycle 3. Taxol/carboplatin not given secondary to neutropenia.    ?7. Herpes zoster involving a right thoracic dermatome July 2015. She completed a course of Valtrex. ?8. Nodular bony prominence at the left pelvis on rectal exam 06/05/2015-likely a benign finding ?9.  Right ureter stone/hydroureteronephrosis on CT 01/13/2021-referred to urology; status post right ureteroscopy with stone extraction and ureteral stent placement.  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  ? ?Disposition: [name] [name] appears stable.  She is on active treatment with every 2-week Taxol/carboplatin.  Taxol was held 2 weeks ago due to neuropathy.  She has persistent neuropathy symptoms affecting daily activity.  Plan to proceed with carboplatin alone today, continue to hold Taxol.  We will follow-up on the CA125 from today.  Restaging CTs will be  scheduled after the treatment in 2 weeks. ? ?CBC and chemistry panel reviewed.  Labs adequate to proceed with treatment as above. ? ?She will return for lab, follow-up, chemotherapy in 2 weeks.  We are available to see her sooner if needed. ? ? ? ?Ned Card ANP/GNP-BC  ? ?02/09/2022  ?9:05 AM ? ? ? ? ? ? ? ?

## 2022-02-09 NOTE — Patient Instructions (Signed)
[facility]   ?Discharge Instructions: ?Thank you for choosing [facility] to provide your oncology and hematology care.  ? ?If you have a lab appointment with the [facility], please go directly to the [facility] and check in at the registration area. ?  ?Wear comfortable clothing and clothing appropriate for easy access to any Portacath or PICC line.  ? ?We strive to give you quality time with your provider. You may need to reschedule your appointment if you arrive late (15 or more minutes).  Arriving late affects you and other patients whose appointments are after yours.  Also, if you miss three or more appointments without notifying the office, you may be dismissed from the clinic at the provider?s discretion.    ?  ?For prescription refill requests, have your pharmacy contact our office and allow 72 hours for refills to be completed.   ? ?Today you received the following chemotherapy and/or immunotherapy agents Carboplatin ?    ?  ?To help prevent nausea and vomiting after your treatment, we encourage you to take your nausea medication as directed. ? ?BELOW ARE SYMPTOMS THAT SHOULD BE REPORTED IMMEDIATELY: ?*FEVER GREATER THAN 100.4 F (38 ?C) OR HIGHER ?*CHILLS OR SWEATING ?*NAUSEA AND VOMITING THAT IS NOT CONTROLLED WITH YOUR NAUSEA MEDICATION ?*UNUSUAL SHORTNESS OF BREATH ?*UNUSUAL BRUISING OR BLEEDING ?*URINARY PROBLEMS (pain or burning when urinating, or frequent urination) ?*BOWEL PROBLEMS (unusual diarrhea, constipation, pain near the anus) ?TENDERNESS IN MOUTH AND THROAT WITH OR WITHOUT PRESENCE OF ULCERS (sore throat, sores in mouth, or a toothache) ?UNUSUAL RASH, SWELLING OR PAIN  ?UNUSUAL VAGINAL DISCHARGE OR ITCHING  ? ?Items with * indicate a potential emergency and should be followed up as soon as possible or go to the Emergency Department if any problems should occur. ? ?Please show the CHEMOTHERAPY ALERT CARD or IMMUNOTHERAPY ALERT CARD at check-in  to the Emergency Department and triage nurse. ? ?Should you have questions after your visit or need to cancel or reschedule your appointment, please contact [facility]  Dept: [phone]  and follow the prompts.  Office hours are 8:00 a.m. to 4:30 p.m. Monday - Friday. Please note that voicemails left after 4:00 p.m. may not be returned until the following business day.  We are closed weekends and major holidays. You have access to a nurse at all times for urgent questions. Please call the main number to the clinic Dept: [phone] and follow the prompts. ? ? ?For any non-urgent questions, you may also contact your provider using MyChart. We now offer e-Visits for anyone 60 and older to request care online for non-urgent symptoms. For details visit [email or web address]. ?  ?Also download the MyChart app! Go to the app store, search "MyChart", open the app, select [facility], and log in with your MyChart username and password. ? ?Due to Covid, a mask is required upon entering the hospital/clinic. If you do not have a mask, one will be given to you upon arrival. For doctor visits, patients may have 1 support person aged 27 or older with them. For treatment visits, patients cannot have anyone with them due to current Covid guidelines and our immunocompromised population.  ? ?Carboplatin injection ?What is this medication? ?CARBOPLATIN (KAR boe pla tin) is a chemotherapy drug. It targets fast dividing cells, like cancer cells, and causes these cells to die. This medicine is used to treat ovarian cancer and many other cancers. ?This medicine may be used for  other purposes; ask your health care provider or pharmacist if you have questions. ?COMMON BRAND NAME(S): Paraplatin ?What should I tell my care team before I take this medication? ?They need to know if you have any of these conditions: ?blood disorders ?hearing problems ?kidney disease ?recent or ongoing radiation therapy ?an  unusual or allergic reaction to carboplatin, cisplatin, other chemotherapy, other medicines, foods, dyes, or preservatives ?pregnant or trying to get pregnant ?breast-feeding ?How should I use this medication? ?This drug is usually given as an infusion into a vein. It is administered in a hospital or clinic by a specially trained health care professional. ?Talk to your pediatrician regarding the use of this medicine in children. Special care may be needed. ?Overdosage: If you think you have taken too much of this medicine contact a poison control center or emergency room at once. ?NOTE: This medicine is only for you. Do not share this medicine with others. ?What if I miss a dose? ?It is important not to miss a dose. Call your doctor or health care professional if you are unable to keep an appointment. ?What may interact with this medication? ?medicines for seizures ?medicines to increase blood counts like filgrastim, pegfilgrastim, sargramostim ?some antibiotics like amikacin, gentamicin, neomycin, streptomycin, tobramycin ?vaccines ?Talk to your doctor or health care professional before taking any of these medicines: ?acetaminophen ?aspirin ?ibuprofen ?ketoprofen ?naproxen ?This list may not describe all possible interactions. Give your health care provider a list of all the medicines, herbs, non-prescription drugs, or dietary supplements you use. Also tell them if you smoke, drink alcohol, or use illegal drugs. Some items may interact with your medicine. ?What should I watch for while using this medication? ?Your condition will be monitored carefully while you are receiving this medicine. You will need important blood work done while you are taking this medicine. ?This drug may make you feel generally unwell. This is not uncommon, as chemotherapy can affect healthy cells as well as cancer cells. Report any side effects. Continue your course of treatment even though you feel ill unless your doctor tells you to  stop. ?In some cases, you may be given additional medicines to help with side effects. Follow all directions for their use. ?Call your doctor or health care professional for advice if you get a fever, chills or sore throat, or other symptoms of a cold or flu. Do not treat yourself. This drug decreases your body's ability to fight infections. Try to avoid being around people who are sick. ?This medicine may increase your risk to bruise or bleed. Call your doctor or health care professional if you notice any unusual bleeding. ?Be careful brushing and flossing your teeth or using a toothpick because you may get an infection or bleed more easily. If you have any dental work done, tell your dentist you are receiving this medicine. ?Avoid taking products that contain aspirin, acetaminophen, ibuprofen, naproxen, or ketoprofen unless instructed by your doctor. These medicines may hide a fever. ?Do not become pregnant while taking this medicine. Women should inform their doctor if they wish to become pregnant or think they might be pregnant. There is a potential for serious side effects to an unborn child. Talk to your health care professional or pharmacist for more information. Do not breast-feed an infant while taking this medicine. ?What side effects may I notice from receiving this medication? ?Side effects that you should report to your doctor or health care professional as soon as possible: ?allergic reactions like skin rash, itching or  hives, swelling of the face, lips, or tongue ?signs of infection - fever or chills, cough, sore throat, pain or difficulty passing urine ?signs of decreased platelets or bleeding - bruising, pinpoint red spots on the skin, black, tarry stools, nosebleeds ?signs of decreased red blood cells - unusually weak or tired, fainting spells, lightheadedness ?breathing problems ?changes in hearing ?changes in vision ?chest pain ?high blood pressure ?low blood counts - This drug may decrease the  number of white blood cells, red blood cells and platelets. You may be at increased risk for infections and bleeding. ?nausea and vomiting ?pain, swelling, redness or irritation at the injection site ?pain, tingl

## 2022-02-10 LAB — CA 125: Cancer Antigen (CA) 125: 14.1 U/mL (ref 0.0–38.1)

## 2022-02-19 ENCOUNTER — Other Ambulatory Visit: Payer: Self-pay | Admitting: Oncology

## 2022-02-23 ENCOUNTER — Encounter: Payer: Self-pay | Admitting: *Deleted

## 2022-02-23 ENCOUNTER — Inpatient Hospital Stay: Payer: Medicare Other | Admitting: Oncology

## 2022-02-23 ENCOUNTER — Inpatient Hospital Stay: Payer: Medicare Other

## 2022-02-23 VITALS — BP 126/76 | HR 70 | Temp 98.6°F | Resp 16 | Wt 151.4 lb

## 2022-02-23 DIAGNOSIS — C786 Secondary malignant neoplasm of retroperitoneum and peritoneum: Secondary | ICD-10-CM | POA: Diagnosis not present

## 2022-02-23 DIAGNOSIS — G620 Drug-induced polyneuropathy: Secondary | ICD-10-CM | POA: Diagnosis not present

## 2022-02-23 DIAGNOSIS — C569 Malignant neoplasm of unspecified ovary: Secondary | ICD-10-CM | POA: Diagnosis not present

## 2022-02-23 DIAGNOSIS — D701 Agranulocytosis secondary to cancer chemotherapy: Secondary | ICD-10-CM | POA: Diagnosis not present

## 2022-02-23 DIAGNOSIS — Z5111 Encounter for antineoplastic chemotherapy: Secondary | ICD-10-CM | POA: Diagnosis not present

## 2022-02-23 LAB — CMP (CANCER CENTER ONLY)
ALT: 21 U/L (ref 0–44)
AST: 20 U/L (ref 15–41)
Albumin: 3.9 g/dL (ref 3.5–5.0)
Alkaline Phosphatase: 51 U/L (ref 38–126)
Anion gap: 11 (ref 5–15)
BUN: 20 mg/dL (ref 8–23)
CO2: 27 mmol/L (ref 22–32)
Calcium: 9.5 mg/dL (ref 8.9–10.3)
Chloride: 103 mmol/L (ref 98–111)
Creatinine: 0.67 mg/dL (ref 0.44–1.00)
GFR, Estimated: >60 mL/min (ref >60)
Glucose, Bld: 93 mg/dL (ref 70–99)
Potassium: 4.2 mmol/L (ref 3.5–5.1)
Sodium: 141 mmol/L (ref 135–145)
Total Bilirubin: 0.5 mg/dL (ref 0.3–1.2)
Total Protein: 6.6 g/dL (ref 6.5–8.1)

## 2022-02-23 LAB — CBC WITH DIFFERENTIAL (CANCER CENTER ONLY)
Abs Immature Granulocytes: 0.02 10*3/uL (ref 0.00–0.07)
Basophils Absolute: 0 10*3/uL (ref 0.0–0.1)
Basophils Relative: 1 %
Eosinophils Absolute: 0.1 10*3/uL (ref 0.0–0.5)
Eosinophils Relative: 3 %
HCT: 40.2 % (ref 36.0–46.0)
Hemoglobin: 13 g/dL (ref 12.0–15.0)
Immature Granulocytes: 1 %
Lymphocytes Relative: 14 %
Lymphs Abs: 0.6 10*3/uL — ABNORMAL LOW (ref 0.7–4.0)
MCH: 34.2 pg — ABNORMAL HIGH (ref 26.0–34.0)
MCHC: 32.3 g/dL (ref 30.0–36.0)
MCV: 105.8 fL — ABNORMAL HIGH (ref 80.0–100.0)
Monocytes Absolute: 0.6 10*3/uL (ref 0.1–1.0)
Monocytes Relative: 14 %
Neutro Abs: 3 10*3/uL (ref 1.7–7.7)
Neutrophils Relative %: 67 %
Platelet Count: 229 10*3/uL (ref 150–400)
RBC: 3.8 MIL/uL — ABNORMAL LOW (ref 3.87–5.11)
RDW: 13.9 % (ref 11.5–15.5)
WBC Count: 4.4 10*3/uL (ref 4.0–10.5)
nRBC: 0 % (ref 0.0–0.2)

## 2022-02-23 MED ORDER — SODIUM CHLORIDE 0.9 % IV SOLN
10.0000 mg | Freq: Once | INTRAVENOUS | Status: AC
  Administered 2022-02-23: 10 mg via INTRAVENOUS
  Filled 2022-02-23: qty 10

## 2022-02-23 MED ORDER — FAMOTIDINE IN NACL 20-0.9 MG/50ML-% IV SOLN
20.0000 mg | Freq: Once | INTRAVENOUS | Status: AC
  Administered 2022-02-23: 20 mg via INTRAVENOUS
  Filled 2022-02-23: qty 50

## 2022-02-23 MED ORDER — SODIUM CHLORIDE 0.9% FLUSH
10.0000 mL | INTRAVENOUS | Status: DC | PRN
  Administered 2022-02-23: 10 mL

## 2022-02-23 MED ORDER — SODIUM CHLORIDE 0.9 % IV SOLN
157.8000 mg | Freq: Once | INTRAVENOUS | Status: AC
  Administered 2022-02-23: 160 mg via INTRAVENOUS
  Filled 2022-02-23: qty 16

## 2022-02-23 MED ORDER — HEPARIN SOD (PORK) LOCK FLUSH 100 UNIT/ML IV SOLN
500.0000 [IU] | Freq: Once | INTRAVENOUS | Status: AC | PRN
  Administered 2022-02-23: 500 [IU]

## 2022-02-23 MED ORDER — DIPHENHYDRAMINE HCL 50 MG/ML IJ SOLN
25.0000 mg | Freq: Once | INTRAMUSCULAR | Status: AC
  Administered 2022-02-23: 25 mg via INTRAVENOUS
  Filled 2022-02-23: qty 1

## 2022-02-23 MED ORDER — PALONOSETRON HCL INJECTION 0.25 MG/5ML
0.2500 mg | Freq: Once | INTRAVENOUS | Status: AC
  Administered 2022-02-23: 0.25 mg via INTRAVENOUS
  Filled 2022-02-23: qty 5

## 2022-02-23 MED ORDER — SODIUM CHLORIDE 0.9 % IV SOLN: Freq: Once | INTRAVENOUS | Status: AC

## 2022-02-23 NOTE — Patient Instructions (Signed)
[facility]   Discharge Instructions: Thank you for choosing [facility] to provide your oncology and hematology care.   If you have a lab appointment with the [facility], please go directly to the [facility] and check in at the registration area.   Wear comfortable clothing and clothing appropriate for easy access to any Portacath or PICC line.   We strive to give you quality time with your provider. You may need to reschedule your appointment if you arrive late (15 or more minutes).  Arriving late affects you and other patients whose appointments are after yours.  Also, if you miss three or more appointments without notifying the office, you may be dismissed from the clinic at the provider's discretion.      For prescription refill requests, have your pharmacy contact our office and allow 72 hours for refills to be completed.    Today you received the following chemotherapy and/or immunotherapy agents Carboplatin (PARAPLATIN).      To help prevent nausea and vomiting after your treatment, we encourage you to take your nausea medication as directed.  BELOW ARE SYMPTOMS THAT SHOULD BE REPORTED IMMEDIATELY: *FEVER GREATER THAN 100.4 F (38 C) OR HIGHER *CHILLS OR SWEATING *NAUSEA AND VOMITING THAT IS NOT CONTROLLED WITH YOUR NAUSEA MEDICATION *UNUSUAL SHORTNESS OF BREATH *UNUSUAL BRUISING OR BLEEDING *URINARY PROBLEMS (pain or burning when urinating, or frequent urination) *BOWEL PROBLEMS (unusual diarrhea, constipation, pain near the anus) TENDERNESS IN MOUTH AND THROAT WITH OR WITHOUT PRESENCE OF ULCERS (sore throat, sores in mouth, or a toothache) UNUSUAL RASH, SWELLING OR PAIN  UNUSUAL VAGINAL DISCHARGE OR ITCHING   Items with * indicate a potential emergency and should be followed up as soon as possible or go to the Emergency Department if any problems should occur.  Please show the CHEMOTHERAPY ALERT CARD or IMMUNOTHERAPY ALERT CARD  at check-in to the Emergency Department and triage nurse.  Should you have questions after your visit or need to cancel or reschedule your appointment, please contact [facility]  Dept: [phone]  and follow the prompts.  Office hours are 8:00 a.m. to 4:30 p.m. Monday - Friday. Please note that voicemails left after 4:00 p.m. may not be returned until the following business day.  We are closed weekends and major holidays. You have access to a nurse at all times for urgent questions. Please call the main number to the clinic Dept: [phone] and follow the prompts.   For any non-urgent questions, you may also contact your provider using MyChart. We now offer e-Visits for anyone 36 and older to request care online for non-urgent symptoms. For details visit [email or web address].   Also download the MyChart app! Go to the app store, search "MyChart", open the app, select [facility], and log in with your MyChart username and password.  Due to Covid, a mask is required upon entering the hospital/clinic. If you do not have a mask, one will be given to you upon arrival. For doctor visits, patients may have 1 support person aged 76 or older with them. For treatment visits, patients cannot have anyone with them due to current Covid guidelines and our immunocompromised population.   Carboplatin injection What is this medication? CARBOPLATIN (KAR boe pla tin) is a chemotherapy drug. It targets fast dividing cells, like cancer cells, and causes these cells to die. This medicine is used to treat ovarian cancer and many other cancers. This medicine may be used for  other purposes; ask your health care provider or pharmacist if you have questions. COMMON BRAND NAME(S): Paraplatin What should I tell my care team before I take this medication? They need to know if you have any of these conditions: blood disorders hearing problems kidney disease recent or ongoing radiation  therapy an unusual or allergic reaction to carboplatin, cisplatin, other chemotherapy, other medicines, foods, dyes, or preservatives pregnant or trying to get pregnant breast-feeding How should I use this medication? This drug is usually given as an infusion into a vein. It is administered in a hospital or clinic by a specially trained health care professional. Talk to your pediatrician regarding the use of this medicine in children. Special care may be needed. Overdosage: If you think you have taken too much of this medicine contact a poison control center or emergency room at once. NOTE: This medicine is only for you. Do not share this medicine with others. What if I miss a dose? It is important not to miss a dose. Call your doctor or health care professional if you are unable to keep an appointment. What may interact with this medication? medicines for seizures medicines to increase blood counts like filgrastim, pegfilgrastim, sargramostim some antibiotics like amikacin, gentamicin, neomycin, streptomycin, tobramycin vaccines Talk to your doctor or health care professional before taking any of these medicines: acetaminophen aspirin ibuprofen ketoprofen naproxen This list may not describe all possible interactions. Give your health care provider a list of all the medicines, herbs, non-prescription drugs, or dietary supplements you use. Also tell them if you smoke, drink alcohol, or use illegal drugs. Some items may interact with your medicine. What should I watch for while using this medication? Your condition will be monitored carefully while you are receiving this medicine. You will need important blood work done while you are taking this medicine. This drug may make you feel generally unwell. This is not uncommon, as chemotherapy can affect healthy cells as well as cancer cells. Report any side effects. Continue your course of treatment even though you feel ill unless your doctor tells  you to stop. In some cases, you may be given additional medicines to help with side effects. Follow all directions for their use. Call your doctor or health care professional for advice if you get a fever, chills or sore throat, or other symptoms of a cold or flu. Do not treat yourself. This drug decreases your body's ability to fight infections. Try to avoid being around people who are sick. This medicine may increase your risk to bruise or bleed. Call your doctor or health care professional if you notice any unusual bleeding. Be careful brushing and flossing your teeth or using a toothpick because you may get an infection or bleed more easily. If you have any dental work done, tell your dentist you are receiving this medicine. Avoid taking products that contain aspirin, acetaminophen, ibuprofen, naproxen, or ketoprofen unless instructed by your doctor. These medicines may hide a fever. Do not become pregnant while taking this medicine. Women should inform their doctor if they wish to become pregnant or think they might be pregnant. There is a potential for serious side effects to an unborn child. Talk to your health care professional or pharmacist for more information. Do not breast-feed an infant while taking this medicine. What side effects may I notice from receiving this medication? Side effects that you should report to your doctor or health care professional as soon as possible: allergic reactions like skin rash, itching or  hives, swelling of the face, lips, or tongue signs of infection - fever or chills, cough, sore throat, pain or difficulty passing urine signs of decreased platelets or bleeding - bruising, pinpoint red spots on the skin, black, tarry stools, nosebleeds signs of decreased red blood cells - unusually weak or tired, fainting spells, lightheadedness breathing problems changes in hearing changes in vision chest pain high blood pressure low blood counts - This drug may  decrease the number of white blood cells, red blood cells and platelets. You may be at increased risk for infections and bleeding. nausea and vomiting pain, swelling, redness or irritation at the injection site pain, tingling, numbness in the hands or feet problems with balance, talking, walking trouble passing urine or change in the amount of urine Side effects that usually do not require medical attention (report to your doctor or health care professional if they continue or are bothersome): hair loss loss of appetite metallic taste in the mouth or changes in taste This list may not describe all possible side effects. Call your doctor for medical advice about side effects. You may report side effects to FDA at 1-800-FDA-1088. Where should I keep my medication? This drug is given in a hospital or clinic and will not be stored at home. NOTE: This sheet is a summary. It may not cover all possible information. If you have questions about this medicine, talk to your doctor, pharmacist, or health care provider.  2023 [employer]vier/Gold Standard (2008-02-28 00:00:00)

## 2022-02-23 NOTE — Progress Notes (Signed)
[place] [facility] OFFICE PROGRESS NOTE   Diagnosis: Ovarian cancer  INTERVAL HISTORY:   [name] completed another cycle of carboplatin 02/09/2022.  No nausea or symptom of allergic reaction.  She has malaise following chemotherapy.  Neuropathy symptoms have partially improved.  Good appetite.  Objective:  Vital signs in last 24 hours:  Blood pressure 126/76, pulse 70, temperature 98.6 F (37 C), temperature source Oral, resp. rate 16, weight 151 lb 6.4 oz (68.7 kg), SpO2 98 %.    HEENT: No thrush or ulcers Resp: Lungs clear bilaterally Cardio: Regular rate and rhythm GI: No hepatosplenomegaly, no mass, mild tenderness in the right lower abdomen, no apparent ascites Vascular: No leg edema    Portacath/PICC-without erythema  Lab Results:  Lab Results  Component Value Date   WBC 4.4 02/23/2022   HGB 13.0 02/23/2022   HCT 40.2 02/23/2022   MCV 105.8 (H) 02/23/2022   PLT 229 02/23/2022   NEUTROABS 3.0 02/23/2022    CMP  Lab Results  Component Value Date   NA 141 02/23/2022   K 4.2 02/23/2022   CL 103 02/23/2022   CO2 27 02/23/2022   GLUCOSE 93 02/23/2022   BUN 20 02/23/2022   CREATININE 0.67 02/23/2022   CALCIUM 9.5 02/23/2022   PROT 6.6 02/23/2022   ALBUMIN 3.9 02/23/2022   AST 20 02/23/2022   ALT 21 02/23/2022   ALKPHOS 51 02/23/2022   BILITOT 0.5 02/23/2022   GFRNONAA >60 02/23/2022   GFRAA >60 03/13/2020    Lab Results  Component Value Date   CEA 0.8 07/13/2012   CA125 8 08/18/2016     Medications: I have reviewed the patient's current medications.   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 A 5 mm nodules remain on the right diaphragm. - TumorNext paired germline/tumor analyses: No somatic variants detected, germline CHEK2      VUS      Cycle 1 of adjuvant  Taxol/carboplatin chemotherapy initiated on 09/19/2012.   The CA 125 normalized.   She completed day 15 of cycle 6 on 02/06/2013.   Restaging CT evaluation 03/29/2013 showed no evidence of metastatic disease in the chest. There was marked improvement in appearance/resolution of previous described peritoneal/omental disease. There was no convincing evidence of residual disease. There was minimal increased density in the region of the omentum favored to be treatment-related. There was no pelvic adenopathy. CA125 3.2 on 02/19/2014. 08/18/2016 CA-125 8 CT abdomen/pelvis 08/25/2016-solitary new enlarged right external iliac lymph node measuring 2.2 cm. Biopsy 09/07/2016-adenocarcinoma consistent with high-grade serous carcinoma. PET scan 09/22/2016-malignant range FDG uptake associated with the enlarged right external iliac lymph node compatible with metastatic adenopathy.  No additional sites of metastatic disease identified. Radiation 10/25/2016-12/01/2016 PET scan 03/28/2017-previously enlarged hypermetabolic right external iliac node-normal in size with resolution of hypermetabolic activity, no active malignancy identified PET scan 05/15/2018-no evidence of recurrent or metastatic disease, no hypermetabolic lymph nodes CT abdomen/pelvis 09/19/2019-mild increase in the size of several left upper quadrant peritoneal nodules measuring up to 11 mm.  No other sites of metastatic disease identified.  No ascites. CT abdomen/pelvis 03/14/2020-slight enlargement of several left upper quadrant peritoneal nodules, no ascites, no other evidence of disease progression CT abdomen/pelvis 09/12/2020 peritoneal nodularity/omental caking predominantly in the left upper/mid abdomen, mildly progressive.  Largest implant in the left upper abdomen adjacent to the stomach now measures 17 mm, previously 13 mm.  Overall volume  of peritoneal disease has mildly progressed. CT abdomen/pelvis 01/13/2021- 4 mm right ureteral calculus  with moderate right hydronephrosis and upper right hydroureter, progressive omental nodularity, stable calcified and partially calcified right pelvic nodules CT abdomen/pelvis 05/13/2021-enlargement of left upper quadrant omental nodules and a peritoneal nodule at the right iliac fossa, no ascites CT abdomen/pelvis 08/14/2021-mild increase in size of omental nodules, no ascites, no new site of metastatic disease Taxol/carboplatin 09/09/2021, 09/15/2021, 09/22/2021 Taxol/carboplatin 10/07/2021, 10/13/2021, 10/20/2021 Taxol/Carboplatin 11/03/2021, 11/10/2021, 11/17/2021 CT 11/23/2021-decrease in peritoneal metastases, no new or progressive disease, airspace opacity at both lung bases Taxol/carboplatin 12/01/2021, 12/08/2021, 12/15/2021 Taxol/Carboplatin every 2 weeks beginning 12/29/2021 Carboplatin alone 01/26/2022, Taxol held due to neuropathy Carboplatin alone 02/09/2022, Taxol held due to neuropathy Carboplatin alone 02/23/2022, Taxol held due to neuropathy   2. Low abdomen/suprapubic pain prior to the exploratory laparotomy-likely secondary to omental/pelvic tumor; persistent mild pain in the lower abdomen   3. Chronic neck and back pain.   4. Anxiety -persistent despite Lexapro and Xanax. She has been evaluated by psychiatry. Currently on Lexapro. 5. Status post Port-A-Cath placement 09/22/2012. The Port-A-Cath was removed on 04/03/2013.   6. Neutropenia secondary to chemotherapy- day 15 cycle 1 and cycle 3. Taxol/carboplatin not given secondary to neutropenia.    7. Herpes zoster involving a right thoracic dermatome July 2015. She completed a course of Valtrex. 8. Nodular bony prominence at the left pelvis on rectal exam 06/05/2015-likely a benign finding 9.  Right ureter stone/hydroureteronephrosis on CT 01/13/2021-referred to urology; status post right ureteroscopy with stone extraction and ureteral stent placement.  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  She will complete another treatment with carboplatin today.  Taxol will remain on hold secondary to neuropathy.  She would like to complete another cycle of chemotherapy 03/09/2022 prior to a restaging CT.  [name] [name] will return for an office visit and chemotherapy in 2 weeks.  [name], MD  02/23/2022  9:42 AM

## 2022-02-23 NOTE — Progress Notes (Signed)
Patient seen by Dr. Sherrill today ? ?Vitals are within treatment parameters. ? ?Labs reviewed by Dr. Sherrill and are within treatment parameters. ? ?Per physician team, patient is ready for treatment and there are NO modifications to the treatment plan.  ?

## 2022-03-06 ENCOUNTER — Other Ambulatory Visit: Payer: Self-pay | Admitting: Oncology

## 2022-03-08 ENCOUNTER — Encounter: Payer: Self-pay | Admitting: Nurse Practitioner

## 2022-03-08 ENCOUNTER — Ambulatory Visit (INDEPENDENT_AMBULATORY_CARE_PROVIDER_SITE_OTHER): Payer: Medicare Other | Admitting: Nurse Practitioner

## 2022-03-08 VITALS — BP 120/76 | HR 65 | Temp 97.7°F | Ht 63.25 in | Wt 149.2 lb

## 2022-03-08 DIAGNOSIS — F32A Depression, unspecified: Secondary | ICD-10-CM

## 2022-03-08 DIAGNOSIS — E785 Hyperlipidemia, unspecified: Secondary | ICD-10-CM

## 2022-03-08 DIAGNOSIS — H6123 Impacted cerumen, bilateral: Secondary | ICD-10-CM

## 2022-03-08 DIAGNOSIS — M069 Rheumatoid arthritis, unspecified: Secondary | ICD-10-CM | POA: Diagnosis not present

## 2022-03-08 DIAGNOSIS — Z0000 Encounter for general adult medical examination without abnormal findings: Secondary | ICD-10-CM

## 2022-03-08 DIAGNOSIS — M8580 Other specified disorders of bone density and structure, unspecified site: Secondary | ICD-10-CM

## 2022-03-08 DIAGNOSIS — C569 Malignant neoplasm of unspecified ovary: Secondary | ICD-10-CM

## 2022-03-08 DIAGNOSIS — F419 Anxiety disorder, unspecified: Secondary | ICD-10-CM

## 2022-03-08 LAB — LIPID PANEL
Cholesterol: 299 mg/dL — ABNORMAL HIGH (ref 0–200)
HDL: 49.7 mg/dL (ref >39.00)
NonHDL: 248.91
Total CHOL/HDL Ratio: 6
Triglycerides: 216 mg/dL — ABNORMAL HIGH (ref 0.0–149.0)
VLDL: 43.2 mg/dL — ABNORMAL HIGH (ref 0.0–40.0)

## 2022-03-08 LAB — CBC
HCT: 41.3 % (ref 36.0–46.0)
Hemoglobin: 13.4 g/dL (ref 12.0–15.0)
MCHC: 32.5 g/dL (ref 30.0–36.0)
MCV: 106 fl — ABNORMAL HIGH (ref 78.0–100.0)
Platelets: 212 10*3/uL (ref 150.0–400.0)
RBC: 3.9 Mil/uL (ref 3.87–5.11)
RDW: 14.5 % (ref 11.5–15.5)
WBC: 5.2 10*3/uL (ref 4.0–10.5)

## 2022-03-08 LAB — COMPREHENSIVE METABOLIC PANEL
ALT: 18 U/L (ref 0–35)
AST: 16 U/L (ref 0–37)
Albumin: 4.2 g/dL (ref 3.5–5.2)
Alkaline Phosphatase: 57 U/L (ref 39–117)
BUN: 25 mg/dL — ABNORMAL HIGH (ref 6–23)
CO2: 28 mEq/L (ref 19–32)
Calcium: 9.3 mg/dL (ref 8.4–10.5)
Chloride: 105 mEq/L (ref 96–112)
Creatinine, Ser: 0.65 mg/dL (ref 0.40–1.20)
GFR: 88.04 mL/min (ref >60.00)
Glucose, Bld: 89 mg/dL (ref 70–99)
Potassium: 3.9 mEq/L (ref 3.5–5.1)
Sodium: 141 mEq/L (ref 135–145)
Total Bilirubin: 0.7 mg/dL (ref 0.2–1.2)
Total Protein: 6.6 g/dL (ref 6.0–8.3)

## 2022-03-08 LAB — LDL CHOLESTEROL, DIRECT: Direct LDL: 197 mg/dL

## 2022-03-08 NOTE — Assessment & Plan Note (Signed)
PHQ-9 and GAD-7 administered in office.  Patient currently in remission

## 2022-03-08 NOTE — Assessment & Plan Note (Signed)
Reviewed current comorbidities along with Medicare annual wellness paperwork that patient brought in office.  Reviewed verbally signed off and sent to medical records to scanned into patient's chart.

## 2022-03-08 NOTE — Assessment & Plan Note (Signed)
Currently followed by Dr. [name] oncology.

## 2022-03-08 NOTE — Progress Notes (Signed)
Established Patient Office Visit  Subjective   Patient ID: [name] [name], female    DOB: 02-Jun-1951  Age: 71 y.o. MRN: [id number]  Chief Complaint  Patient presents with   Medicare Wellness    HPI  Medicare annual wellness Hearing Screening   '500Hz'$  '1000Hz'$  '2000Hz'$  '4000Hz'$   Right ear 40 0 0 0  Left ear 0 0 0 0  Vision Screening - Comments:: Had eye exam around 10/2021-[facility]   IBS: followed by GI   Ovarian cancer followed by Dr. Ammie Dalton  HLD: states that she has been outside and doing lots of yard and house work.  Anxiety and depression: In remission see PHQ9  Dr. Max[facility]  for complete physical and follow up of chronic conditions.  Immunizations: -Tetanus: 2016 -Influenza: Season -Covid-19: Not up-to-date -Shingles: Refused -Pneumonia: Has had Prevnar 57 and pneumococcal 23  Eye exam: Completes annually Dr. Gershon Crane in [place] Dental exam: Completes semi-annually   Pap Smear: Aged out to see gynecology and oncology Mammogram: Up-to-date  Colonoscopy: Due for colonoscopy Lung Cancer Screening: N/A Dexa: Needs updating  Advance directives: Patient currently working with a lawyer to finalize divorce and then will get a will.  Did discuss with patient if between a living will and will  Other healthcare providers: Dr. [name], oncology Dr. Maxie Better, orthopedist Freada Bergeron, NP, gynecology/oncology Dr. Lucio Edward, gastroenterology     03/08/2022    9:45 AM 07/07/2021   11:42 AM 02/11/2021   10:05 AM  PHQ9 SCORE ONLY  PHQ-9 Total Score 0 8 0       Review of Systems  Constitutional:  Positive for malaise/fatigue. Negative for chills and fever.  Respiratory:  Positive for shortness of breath (DOE). Negative for cough.   Cardiovascular:  Negative for chest pain and leg swelling.  Gastrointestinal:  Negative for diarrhea and nausea.       BM every other day   Genitourinary:  Negative for dysuria and hematuria.  Neurological:  Positive for  tingling (from chemo therapy). Negative for dizziness and headaches.  Psychiatric/Behavioral:  Negative for hallucinations and suicidal ideas.      Objective:     BP 120/76   Pulse 65   Temp 97.7 F (36.5 C)   Ht 5' 3.25" (1.607 m)   Wt 149 lb 4 oz (67.7 kg)   SpO2 96%   BMI 26.23 kg/m  BP Readings from Last 3 Encounters:  03/08/22 120/76  02/23/22 126/76  02/09/22 118/71   Wt Readings from Last 3 Encounters:  03/08/22 149 lb 4 oz (67.7 kg)  02/23/22 151 lb 6.4 oz (68.7 kg)  02/09/22 153 lb (69.4 kg)      Physical Exam Vitals reviewed.  Constitutional:      Appearance: Normal appearance.  HENT:     Right Ear: There is impacted cerumen.     Left Ear: There is impacted cerumen.     Mouth/Throat:     Mouth: Mucous membranes are moist.     Pharynx: Oropharynx is clear.  Eyes:     Extraocular Movements: Extraocular movements intact.     Pupils: Pupils are equal, round, and reactive to light.  Cardiovascular:     Rate and Rhythm: Normal rate and regular rhythm.     Pulses: Normal pulses.     Heart sounds: Normal heart sounds.  Pulmonary:     Effort: Pulmonary effort is normal.     Breath sounds: Normal breath sounds.  Abdominal:     General:  Bowel sounds are normal. There is no distension.     Palpations: There is no mass.     Tenderness: There is no abdominal tenderness.     Hernia: No hernia is present.  Musculoskeletal:     Right lower leg: No edema.     Left lower leg: No edema.  Lymphadenopathy:     Cervical: No cervical adenopathy.  Neurological:     General: No focal deficit present.     Mental Status: She is alert.     Comments: Bilateral upper and lower extremity strength 5/5  Psychiatric:        Mood and Affect: Mood normal.        Behavior: Behavior normal.        Thought Content: Thought content normal.        Judgment: Judgment normal.     No results found for any visits on 03/08/22.    The 10-year ASCVD risk score (Arnett DK, et al.,  2019) is: 10.7%    Assessment & Plan:   Problem List Items Addressed This Visit       Endocrine   Ovarian cancer [facility])    Currently followed by Dr. [name] oncology.       Relevant Orders   CBC   Comprehensive metabolic panel     Nervous and Auditory   Bilateral impacted cerumen    Verbal consent obtained.  Cerumen softening eardrops were instilled.  A mixture of water and hydroperoxide was used per office policy to irrigate bilateral ears.  Disimpaction was removed.  Patient tolerated procedure well       Relevant Orders   Ear Lavage     Musculoskeletal and Integument   Rheumatoid arthritis ([place])    Historical diagnosis and apparently in bilateral hands.  Patient has seen rheumatology in the past but did not want to pursue treatment.  Patient currently does not want to pursue any treatment       Osteopenia    History of the same.  Due to have repeat DEXA scan, order placed today       Relevant Orders   DG Bone Density     Other   Anxiety and depression    PHQ-9 and GAD-7 administered in office.  Patient currently in remission       Medicare annual wellness visit, subsequent - Primary    Reviewed current comorbidities along with Medicare annual wellness paperwork that patient brought in office.  Reviewed verbally signed off and sent to medical records to scanned into patient's chart.       Hyperlipidemia    Pending lipid panel today       Relevant Orders   Lipid panel    Return in about 6 months (around 09/07/2022) for Recheck and official CPE.    Romilda Garret, NP

## 2022-03-08 NOTE — Patient Instructions (Signed)
Nice to see you today Follow up 6 months for a recheck I will be in touch with the labs  Contact Dr. Silvio Pate office in regards to getting a colonoscopy  Address: [address], [place], [place] [address] Phone: [phone]

## 2022-03-08 NOTE — Assessment & Plan Note (Signed)
Historical diagnosis and apparently in bilateral hands.  Patient has seen rheumatology in the past but did not want to pursue treatment.  Patient currently does not want to pursue any treatment

## 2022-03-08 NOTE — Assessment & Plan Note (Signed)
Pending lipid panel today 

## 2022-03-08 NOTE — Assessment & Plan Note (Signed)
Verbal consent obtained.  Cerumen softening eardrops were instilled.  A mixture of water and hydroperoxide was used per office policy to irrigate bilateral ears.  Disimpaction was removed.  Patient tolerated procedure well

## 2022-03-08 NOTE — Assessment & Plan Note (Signed)
History of the same.  Due to have repeat DEXA scan, order placed today

## 2022-03-09 ENCOUNTER — Inpatient Hospital Stay: Payer: Medicare Other

## 2022-03-09 ENCOUNTER — Encounter: Payer: Self-pay | Admitting: *Deleted

## 2022-03-09 ENCOUNTER — Inpatient Hospital Stay: Payer: Medicare Other | Admitting: Oncology

## 2022-03-09 ENCOUNTER — Inpatient Hospital Stay: Payer: Medicare Other | Attending: Oncology

## 2022-03-09 VITALS — BP 112/69 | HR 77 | Temp 98.1°F | Resp 18 | Ht 63.0 in | Wt 151.0 lb

## 2022-03-09 DIAGNOSIS — D701 Agranulocytosis secondary to cancer chemotherapy: Secondary | ICD-10-CM | POA: Insufficient documentation

## 2022-03-09 DIAGNOSIS — C569 Malignant neoplasm of unspecified ovary: Secondary | ICD-10-CM

## 2022-03-09 DIAGNOSIS — C786 Secondary malignant neoplasm of retroperitoneum and peritoneum: Secondary | ICD-10-CM | POA: Insufficient documentation

## 2022-03-09 DIAGNOSIS — G620 Drug-induced polyneuropathy: Secondary | ICD-10-CM | POA: Insufficient documentation

## 2022-03-09 DIAGNOSIS — Z5111 Encounter for antineoplastic chemotherapy: Secondary | ICD-10-CM | POA: Insufficient documentation

## 2022-03-09 LAB — CBC WITH DIFFERENTIAL (CANCER CENTER ONLY)
Abs Immature Granulocytes: 0.01 10*3/uL (ref 0.00–0.07)
Basophils Absolute: 0 10*3/uL (ref 0.0–0.1)
Basophils Relative: 1 %
Eosinophils Absolute: 0.1 10*3/uL (ref 0.0–0.5)
Eosinophils Relative: 2 %
HCT: 39.6 % (ref 36.0–46.0)
Hemoglobin: 12.7 g/dL (ref 12.0–15.0)
Immature Granulocytes: 0 %
Lymphocytes Relative: 18 %
Lymphs Abs: 0.8 10*3/uL (ref 0.7–4.0)
MCH: 34 pg (ref 26.0–34.0)
MCHC: 32.1 g/dL (ref 30.0–36.0)
MCV: 105.9 fL — ABNORMAL HIGH (ref 80.0–100.0)
Monocytes Absolute: 0.5 10*3/uL (ref 0.1–1.0)
Monocytes Relative: 10 %
Neutro Abs: 3.2 10*3/uL (ref 1.7–7.7)
Neutrophils Relative %: 69 %
Platelet Count: 198 10*3/uL (ref 150–400)
RBC: 3.74 MIL/uL — ABNORMAL LOW (ref 3.87–5.11)
RDW: 14 % (ref 11.5–15.5)
WBC Count: 4.6 10*3/uL (ref 4.0–10.5)
nRBC: 0 % (ref 0.0–0.2)

## 2022-03-09 LAB — CMP (CANCER CENTER ONLY)
ALT: 18 U/L (ref 0–44)
AST: 17 U/L (ref 15–41)
Albumin: 4 g/dL (ref 3.5–5.0)
Alkaline Phosphatase: 47 U/L (ref 38–126)
Anion gap: 11 (ref 5–15)
BUN: 28 mg/dL — ABNORMAL HIGH (ref 8–23)
CO2: 26 mmol/L (ref 22–32)
Calcium: 9.7 mg/dL (ref 8.9–10.3)
Chloride: 106 mmol/L (ref 98–111)
Creatinine: 0.71 mg/dL (ref 0.44–1.00)
GFR, Estimated: >60 mL/min (ref >60)
Glucose, Bld: 120 mg/dL — ABNORMAL HIGH (ref 70–99)
Potassium: 3.6 mmol/L (ref 3.5–5.1)
Sodium: 143 mmol/L (ref 135–145)
Total Bilirubin: 0.6 mg/dL (ref 0.3–1.2)
Total Protein: 6.9 g/dL (ref 6.5–8.1)

## 2022-03-09 MED ORDER — SODIUM CHLORIDE 0.9 % IV SOLN
10.0000 mg | Freq: Once | INTRAVENOUS | Status: AC
  Administered 2022-03-09: 10 mg via INTRAVENOUS
  Filled 2022-03-09: qty 1

## 2022-03-09 MED ORDER — FAMOTIDINE IN NACL 20-0.9 MG/50ML-% IV SOLN
20.0000 mg | Freq: Once | INTRAVENOUS | Status: AC
  Administered 2022-03-09: 20 mg via INTRAVENOUS
  Filled 2022-03-09: qty 50

## 2022-03-09 MED ORDER — SODIUM CHLORIDE 0.9% FLUSH
10.0000 mL | INTRAVENOUS | Status: DC | PRN
  Administered 2022-03-09: 10 mL

## 2022-03-09 MED ORDER — HEPARIN SOD (PORK) LOCK FLUSH 100 UNIT/ML IV SOLN
500.0000 [IU] | Freq: Once | INTRAVENOUS | Status: AC | PRN
  Administered 2022-03-09: 500 [IU]

## 2022-03-09 MED ORDER — PALONOSETRON HCL INJECTION 0.25 MG/5ML
0.2500 mg | Freq: Once | INTRAVENOUS | Status: AC
  Administered 2022-03-09: 0.25 mg via INTRAVENOUS
  Filled 2022-03-09: qty 5

## 2022-03-09 MED ORDER — SODIUM CHLORIDE 0.9 % IV SOLN: Freq: Once | INTRAVENOUS | Status: AC

## 2022-03-09 MED ORDER — DIPHENHYDRAMINE HCL 50 MG/ML IJ SOLN
25.0000 mg | Freq: Once | INTRAMUSCULAR | Status: AC
  Administered 2022-03-09: 25 mg via INTRAVENOUS
  Filled 2022-03-09: qty 1

## 2022-03-09 MED ORDER — SODIUM CHLORIDE 0.9 % IV SOLN
157.8000 mg | Freq: Once | INTRAVENOUS | Status: AC
  Administered 2022-03-09: 160 mg via INTRAVENOUS
  Filled 2022-03-09: qty 16

## 2022-03-09 NOTE — Patient Instructions (Signed)

## 2022-03-09 NOTE — Patient Instructions (Signed)
[facility]   Discharge Instructions: Thank you for choosing [facility] to provide your oncology and hematology care.   If you have a lab appointment with the [facility], please go directly to the [facility] and check in at the registration area.   Wear comfortable clothing and clothing appropriate for easy access to any Portacath or PICC line.   We strive to give you quality time with your provider. You may need to reschedule your appointment if you arrive late (15 or more minutes).  Arriving late affects you and other patients whose appointments are after yours.  Also, if you miss three or more appointments without notifying the office, you may be dismissed from the clinic at the provider's discretion.      For prescription refill requests, have your pharmacy contact our office and allow 72 hours for refills to be completed.    Today you received the following chemotherapy and/or immunotherapy agents Carboplatin       To help prevent nausea and vomiting after your treatment, we encourage you to take your nausea medication as directed.  BELOW ARE SYMPTOMS THAT SHOULD BE REPORTED IMMEDIATELY: *FEVER GREATER THAN 100.4 F (38 C) OR HIGHER *CHILLS OR SWEATING *NAUSEA AND VOMITING THAT IS NOT CONTROLLED WITH YOUR NAUSEA MEDICATION *UNUSUAL SHORTNESS OF BREATH *UNUSUAL BRUISING OR BLEEDING *URINARY PROBLEMS (pain or burning when urinating, or frequent urination) *BOWEL PROBLEMS (unusual diarrhea, constipation, pain near the anus) TENDERNESS IN MOUTH AND THROAT WITH OR WITHOUT PRESENCE OF ULCERS (sore throat, sores in mouth, or a toothache) UNUSUAL RASH, SWELLING OR PAIN  UNUSUAL VAGINAL DISCHARGE OR ITCHING   Items with * indicate a potential emergency and should be followed up as soon as possible or go to the Emergency Department if any problems should occur.  Please show the CHEMOTHERAPY ALERT CARD or IMMUNOTHERAPY ALERT CARD at check-in  to the Emergency Department and triage nurse.  Should you have questions after your visit or need to cancel or reschedule your appointment, please contact [facility]  Dept: [phone]  and follow the prompts.  Office hours are 8:00 a.m. to 4:30 p.m. Monday - Friday. Please note that voicemails left after 4:00 p.m. may not be returned until the following business day.  We are closed weekends and major holidays. You have access to a nurse at all times for urgent questions. Please call the main number to the clinic Dept: [phone] and follow the prompts.   For any non-urgent questions, you may also contact your provider using MyChart. We now offer e-Visits for anyone 34 and older to request care online for non-urgent symptoms. For details visit [email or web address].   Also download the MyChart app! Go to the app store, search "MyChart", open the app, select [facility], and log in with your MyChart username and password.  Due to Covid, a mask is required upon entering the hospital/clinic. If you do not have a mask, one will be given to you upon arrival. For doctor visits, patients may have 1 support person aged 49 or older with them. For treatment visits, patients cannot have anyone with them due to current Covid guidelines and our immunocompromised population.   Carboplatin injection What is this medication? CARBOPLATIN (KAR boe pla tin) is a chemotherapy drug. It targets fast dividing cells, like cancer cells, and causes these cells to die. This medicine is used to treat ovarian cancer and many other cancers. This medicine may be used for  other purposes; ask your health care provider or pharmacist if you have questions. COMMON BRAND NAME(S): Paraplatin What should I tell my care team before I take this medication? They need to know if you have any of these conditions: blood disorders hearing problems kidney disease recent or ongoing radiation therapy an  unusual or allergic reaction to carboplatin, cisplatin, other chemotherapy, other medicines, foods, dyes, or preservatives pregnant or trying to get pregnant breast-feeding How should I use this medication? This drug is usually given as an infusion into a vein. It is administered in a hospital or clinic by a specially trained health care professional. Talk to your pediatrician regarding the use of this medicine in children. Special care may be needed. Overdosage: If you think you have taken too much of this medicine contact a poison control center or emergency room at once. NOTE: This medicine is only for you. Do not share this medicine with others. What if I miss a dose? It is important not to miss a dose. Call your doctor or health care professional if you are unable to keep an appointment. What may interact with this medication? medicines for seizures medicines to increase blood counts like filgrastim, pegfilgrastim, sargramostim some antibiotics like amikacin, gentamicin, neomycin, streptomycin, tobramycin vaccines Talk to your doctor or health care professional before taking any of these medicines: acetaminophen aspirin ibuprofen ketoprofen naproxen This list may not describe all possible interactions. Give your health care provider a list of all the medicines, herbs, non-prescription drugs, or dietary supplements you use. Also tell them if you smoke, drink alcohol, or use illegal drugs. Some items may interact with your medicine. What should I watch for while using this medication? Your condition will be monitored carefully while you are receiving this medicine. You will need important blood work done while you are taking this medicine. This drug may make you feel generally unwell. This is not uncommon, as chemotherapy can affect healthy cells as well as cancer cells. Report any side effects. Continue your course of treatment even though you feel ill unless your doctor tells you to  stop. In some cases, you may be given additional medicines to help with side effects. Follow all directions for their use. Call your doctor or health care professional for advice if you get a fever, chills or sore throat, or other symptoms of a cold or flu. Do not treat yourself. This drug decreases your body's ability to fight infections. Try to avoid being around people who are sick. This medicine may increase your risk to bruise or bleed. Call your doctor or health care professional if you notice any unusual bleeding. Be careful brushing and flossing your teeth or using a toothpick because you may get an infection or bleed more easily. If you have any dental work done, tell your dentist you are receiving this medicine. Avoid taking products that contain aspirin, acetaminophen, ibuprofen, naproxen, or ketoprofen unless instructed by your doctor. These medicines may hide a fever. Do not become pregnant while taking this medicine. Women should inform their doctor if they wish to become pregnant or think they might be pregnant. There is a potential for serious side effects to an unborn child. Talk to your health care professional or pharmacist for more information. Do not breast-feed an infant while taking this medicine. What side effects may I notice from receiving this medication? Side effects that you should report to your doctor or health care professional as soon as possible: allergic reactions like skin rash, itching or  hives, swelling of the face, lips, or tongue signs of infection - fever or chills, cough, sore throat, pain or difficulty passing urine signs of decreased platelets or bleeding - bruising, pinpoint red spots on the skin, black, tarry stools, nosebleeds signs of decreased red blood cells - unusually weak or tired, fainting spells, lightheadedness breathing problems changes in hearing changes in vision chest pain high blood pressure low blood counts - This drug may decrease the  number of white blood cells, red blood cells and platelets. You may be at increased risk for infections and bleeding. nausea and vomiting pain, swelling, redness or irritation at the injection site pain, tingling, numbness in the hands or feet problems with balance, talking, walking trouble passing urine or change in the amount of urine Side effects that usually do not require medical attention (report to your doctor or health care professional if they continue or are bothersome): hair loss loss of appetite metallic taste in the mouth or changes in taste This list may not describe all possible side effects. Call your doctor for medical advice about side effects. You may report side effects to FDA at 1-800-FDA-1088. Where should I keep my medication? This drug is given in a hospital or clinic and will not be stored at home. NOTE: This sheet is a summary. It may not cover all possible information. If you have questions about this medicine, talk to your doctor, pharmacist, or health care provider.  2023 Elsevier/Gold Standard (2008-02-28 00:00:00)

## 2022-03-09 NOTE — Progress Notes (Signed)
[place] OFFICE PROGRESS NOTE   Diagnosis: Ovarian cancer  INTERVAL HISTORY:   [name] [name] completed another treatment with carboplatin on 02/24/2019.  No nausea or symptoms of an allergic reaction.  She has mild intermittent right lower abdominal discomfort.  No other complaint.  The peripheral neuropathy symptoms have improved.  Objective:  Vital signs in last 24 hours:  Blood pressure 112/69, pulse 77, temperature 98.1 F (36.7 C), temperature source Oral, resp. rate 18, height '5\' 3"'  (1.6 m), weight 151 lb (68.5 kg), SpO2 100 %.    HEENT: No thrush or ulcers Resp: Lungs clear bilaterally Cardio: Regular rate and rhythm GI: No hepatosplenomegaly, no mass, nontender Vascular: No leg edema   Portacath/PICC-without erythema  Lab Results:  Lab Results  Component Value Date   WBC 4.6 03/09/2022   HGB 12.7 03/09/2022   HCT 39.6 03/09/2022   MCV 105.9 (H) 03/09/2022   PLT 198 03/09/2022   NEUTROABS 3.2 03/09/2022    CMP  Lab Results  Component Value Date   NA 143 03/09/2022   K 3.6 03/09/2022   CL 106 03/09/2022   CO2 26 03/09/2022   GLUCOSE 120 (H) 03/09/2022   BUN 28 (H) 03/09/2022   CREATININE 0.71 03/09/2022   CALCIUM 9.7 03/09/2022   PROT 6.9 03/09/2022   ALBUMIN 4.0 03/09/2022   AST 17 03/09/2022   ALT 18 03/09/2022   ALKPHOS 47 03/09/2022   BILITOT 0.6 03/09/2022   GFRNONAA >60 03/09/2022   GFRAA >60 03/13/2020    Lab Results  Component Value Date   CEA 0.8 07/13/2012   CA125 8 08/18/2016    Medications: I have reviewed the patient's current medications.   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 A 5 mm nodules remain on the right diaphragm. - TumorNext paired germline/tumor analyses: No somatic variants detected, germline CHEK2      VUS      Cycle 1 of  adjuvant Taxol/carboplatin chemotherapy initiated on 09/19/2012.   The CA 125 normalized.   She completed day 15 of cycle 6 on 02/06/2013.   Restaging CT evaluation 03/29/2013 showed no evidence of metastatic disease in the chest. There was marked improvement in appearance/resolution of previous described peritoneal/omental disease. There was no convincing evidence of residual disease. There was minimal increased density in the region of the omentum favored to be treatment-related. There was no pelvic adenopathy. CA125 3.2 on 02/19/2014. 08/18/2016 CA-125 8 CT abdomen/pelvis 08/25/2016-solitary new enlarged right external iliac lymph node measuring 2.2 cm. Biopsy 09/07/2016-adenocarcinoma consistent with high-grade serous carcinoma. PET scan 09/22/2016-malignant range FDG uptake associated with the enlarged right external iliac lymph node compatible with metastatic adenopathy.  No additional sites of metastatic disease identified. Radiation 10/25/2016-12/01/2016 PET scan 03/28/2017-previously enlarged hypermetabolic right external iliac node-normal in size with resolution of hypermetabolic activity, no active malignancy identified PET scan 05/15/2018-no evidence of recurrent or metastatic disease, no hypermetabolic lymph nodes CT abdomen/pelvis 09/19/2019-mild increase in the size of several left upper quadrant peritoneal nodules measuring up to 11 mm.  No other sites of metastatic disease identified.  No ascites. CT abdomen/pelvis 03/14/2020-slight enlargement of several left upper quadrant peritoneal nodules, no ascites, no other evidence of disease progression CT abdomen/pelvis 09/12/2020 peritoneal nodularity/omental caking predominantly in the left upper/mid abdomen, mildly progressive.  Largest implant in the left upper abdomen adjacent to the stomach now measures 17 mm, previously 13 mm.  Overall volume of peritoneal disease has mildly progressed. CT abdomen/pelvis 01/13/2021- 4 mm right ureteral  calculus with moderate right hydronephrosis and upper right hydroureter, progressive omental nodularity, stable calcified and partially calcified right pelvic nodules CT abdomen/pelvis 05/13/2021-enlargement of left upper quadrant omental nodules and a peritoneal nodule at the right iliac fossa, no ascites CT abdomen/pelvis 08/14/2021-mild increase in size of omental nodules, no ascites, no new site of metastatic disease Taxol/carboplatin 09/09/2021, 09/15/2021, 09/22/2021 Taxol/carboplatin 10/07/2021, 10/13/2021, 10/20/2021 Taxol/Carboplatin 11/03/2021, 11/10/2021, 11/17/2021 CT 11/23/2021-decrease in peritoneal metastases, no new or progressive disease, airspace opacity at both lung bases Taxol/carboplatin 12/01/2021, 12/08/2021, 12/15/2021 Taxol/Carboplatin every 2 weeks beginning 12/29/2021 Carboplatin alone 01/26/2022, Taxol held due to neuropathy Carboplatin alone 02/09/2022, Taxol held due to neuropathy Carboplatin alone 02/23/2022, Taxol held due to neuropathy Carboplatin alone 03/09/2022, Taxol held due to neuropathy   2. Low abdomen/suprapubic pain prior to the exploratory laparotomy-likely secondary to omental/pelvic tumor; persistent mild pain in the lower abdomen   3. Chronic neck and back pain.   4. Anxiety -persistent despite Lexapro and Xanax. She has been evaluated by psychiatry. Currently on Lexapro. 5. Status post Port-A-Cath placement 09/22/2012. The Port-A-Cath was removed on 04/03/2013.   6. Neutropenia secondary to chemotherapy- day 15 cycle 1 and cycle 3. Taxol/carboplatin not given secondary to neutropenia.    7. Herpes zoster involving a right thoracic dermatome July 2015. She completed a course of Valtrex. 8. Nodular bony prominence at the left pelvis on rectal exam 06/05/2015-likely a benign finding 9.  Right ureter stone/hydroureteronephrosis on CT 01/13/2021-referred to urology; status post right ureteroscopy with stone extraction and ureteral stent placement.  Stent removal 03/11/2021 10.   Taxol neuropathy       Disposition: [name] [name] appears stable.  She will complete another treatment carboplatin today.  She will undergo a restaging CT prior to an office visit in 2 weeks.  [name], MD  03/09/2022  9:06 AM

## 2022-03-09 NOTE — Progress Notes (Signed)
Patient seen by Dr. Sherrill today ? ?Vitals are within treatment parameters. ? ?Labs reviewed by Dr. Sherrill and are within treatment parameters. ? ?Per physician team, patient is ready for treatment and there are NO modifications to the treatment plan.  ?

## 2022-03-10 LAB — CA 125: Cancer Antigen (CA) 125: 11.4 U/mL (ref 0.0–38.1)

## 2022-03-11 ENCOUNTER — Telehealth: Payer: Self-pay | Admitting: Nurse Practitioner

## 2022-03-11 ENCOUNTER — Ambulatory Visit: Payer: Medicare Other

## 2022-03-11 NOTE — Telephone Encounter (Signed)
Pt said she missed call from yesterday about labs and just asked for a call back at [phone]

## 2022-03-11 NOTE — Telephone Encounter (Signed)
See lab result notes, patient advised. 

## 2022-03-20 ENCOUNTER — Other Ambulatory Visit: Payer: Self-pay | Admitting: Oncology

## 2022-03-22 ENCOUNTER — Inpatient Hospital Stay: Payer: Medicare Other

## 2022-03-22 ENCOUNTER — Ambulatory Visit (HOSPITAL_BASED_OUTPATIENT_CLINIC_OR_DEPARTMENT_OTHER): Payer: Medicare Other

## 2022-03-23 ENCOUNTER — Encounter: Payer: Self-pay | Admitting: Gastroenterology

## 2022-03-25 ENCOUNTER — Inpatient Hospital Stay: Admission: RE | Admit: 2022-03-25 | Payer: Medicare Other | Source: Ambulatory Visit

## 2022-03-25 ENCOUNTER — Inpatient Hospital Stay: Payer: Medicare Other

## 2022-03-25 ENCOUNTER — Ambulatory Visit (HOSPITAL_BASED_OUTPATIENT_CLINIC_OR_DEPARTMENT_OTHER)
Admission: RE | Admit: 2022-03-25 | Discharge: 2022-03-25 | Disposition: A | Discharge status: Home / Self Care | Payer: Medicare Other | Source: Ambulatory Visit | Attending: Oncology | Admitting: Oncology

## 2022-03-25 DIAGNOSIS — Z5111 Encounter for antineoplastic chemotherapy: Secondary | ICD-10-CM | POA: Diagnosis not present

## 2022-03-25 DIAGNOSIS — Z9889 Other specified postprocedural states: Secondary | ICD-10-CM | POA: Insufficient documentation

## 2022-03-25 DIAGNOSIS — K668 Other specified disorders of peritoneum: Secondary | ICD-10-CM | POA: Insufficient documentation

## 2022-03-25 DIAGNOSIS — Z95828 Presence of other vascular implants and grafts: Secondary | ICD-10-CM

## 2022-03-25 DIAGNOSIS — C569 Malignant neoplasm of unspecified ovary: Secondary | ICD-10-CM

## 2022-03-25 DIAGNOSIS — Z90710 Acquired absence of both cervix and uterus: Secondary | ICD-10-CM | POA: Diagnosis not present

## 2022-03-25 DIAGNOSIS — I700 Atherosclerosis of aorta: Secondary | ICD-10-CM | POA: Diagnosis not present

## 2022-03-25 LAB — CBC WITH DIFFERENTIAL (CANCER CENTER ONLY)
Abs Immature Granulocytes: 0.01 10*3/uL (ref 0.00–0.07)
Basophils Absolute: 0 10*3/uL (ref 0.0–0.1)
Basophils Relative: 1 %
Eosinophils Absolute: 0.1 10*3/uL (ref 0.0–0.5)
Eosinophils Relative: 1 %
HCT: 39.9 % (ref 36.0–46.0)
Hemoglobin: 12.9 g/dL (ref 12.0–15.0)
Immature Granulocytes: 0 %
Lymphocytes Relative: 14 %
Lymphs Abs: 0.7 10*3/uL (ref 0.7–4.0)
MCH: 33.8 pg (ref 26.0–34.0)
MCHC: 32.3 g/dL (ref 30.0–36.0)
MCV: 104.5 fL — ABNORMAL HIGH (ref 80.0–100.0)
Monocytes Absolute: 0.8 10*3/uL (ref 0.1–1.0)
Monocytes Relative: 16 %
Neutro Abs: 3.4 10*3/uL (ref 1.7–7.7)
Neutrophils Relative %: 68 %
Platelet Count: 193 10*3/uL (ref 150–400)
RBC: 3.82 MIL/uL — ABNORMAL LOW (ref 3.87–5.11)
RDW: 13.7 % (ref 11.5–15.5)
WBC Count: 5 10*3/uL (ref 4.0–10.5)
nRBC: 0 % (ref 0.0–0.2)

## 2022-03-25 LAB — CMP (CANCER CENTER ONLY)
ALT: 19 U/L (ref 0–44)
AST: 20 U/L (ref 15–41)
Albumin: 4 g/dL (ref 3.5–5.0)
Alkaline Phosphatase: 53 U/L (ref 38–126)
Anion gap: 9 (ref 5–15)
BUN: 18 mg/dL (ref 8–23)
CO2: 27 mmol/L (ref 22–32)
Calcium: 9.1 mg/dL (ref 8.9–10.3)
Chloride: 101 mmol/L (ref 98–111)
Creatinine: 0.68 mg/dL (ref 0.44–1.00)
GFR, Estimated: >60 mL/min (ref >60)
Glucose, Bld: 80 mg/dL (ref 70–99)
Potassium: 3.8 mmol/L (ref 3.5–5.1)
Sodium: 137 mmol/L (ref 135–145)
Total Bilirubin: 0.6 mg/dL (ref 0.3–1.2)
Total Protein: 6.5 g/dL (ref 6.5–8.1)

## 2022-03-25 MED ORDER — SODIUM CHLORIDE 0.9% FLUSH
10.0000 mL | Freq: Once | INTRAVENOUS | Status: AC
  Administered 2022-03-25: 10 mL via INTRAVENOUS

## 2022-03-25 MED ORDER — IOHEXOL 300 MG/ML  SOLN
100.0000 mL | Freq: Once | INTRAMUSCULAR | Status: AC | PRN
  Administered 2022-03-25: 80 mL via INTRAVENOUS

## 2022-03-25 MED ORDER — SODIUM CHLORIDE 0.9% FLUSH
10.0000 mL | INTRAVENOUS | Status: DC | PRN
  Filled 2022-03-25: qty 10

## 2022-03-25 MED ORDER — HEPARIN SOD (PORK) LOCK FLUSH 100 UNIT/ML IV SOLN
500.0000 [IU] | Freq: Once | INTRAVENOUS | Status: AC
  Administered 2022-03-25: 500 [IU] via INTRAVENOUS

## 2022-03-26 ENCOUNTER — Inpatient Hospital Stay: Payer: Medicare Other

## 2022-03-26 ENCOUNTER — Inpatient Hospital Stay: Payer: Medicare Other | Admitting: Nurse Practitioner

## 2022-03-26 ENCOUNTER — Other Ambulatory Visit: Payer: Medicare Other

## 2022-03-26 ENCOUNTER — Encounter: Payer: Self-pay | Admitting: Nurse Practitioner

## 2022-03-26 ENCOUNTER — Encounter: Payer: Self-pay | Admitting: *Deleted

## 2022-03-26 VITALS — BP 131/82 | HR 75 | Temp 97.9°F | Resp 18 | Ht 63.0 in | Wt 149.6 lb

## 2022-03-26 DIAGNOSIS — Z5111 Encounter for antineoplastic chemotherapy: Secondary | ICD-10-CM | POA: Diagnosis not present

## 2022-03-26 DIAGNOSIS — D701 Agranulocytosis secondary to cancer chemotherapy: Secondary | ICD-10-CM | POA: Diagnosis not present

## 2022-03-26 DIAGNOSIS — C569 Malignant neoplasm of unspecified ovary: Secondary | ICD-10-CM

## 2022-03-26 DIAGNOSIS — C786 Secondary malignant neoplasm of retroperitoneum and peritoneum: Secondary | ICD-10-CM | POA: Diagnosis not present

## 2022-03-26 DIAGNOSIS — G620 Drug-induced polyneuropathy: Secondary | ICD-10-CM | POA: Diagnosis not present

## 2022-03-26 LAB — CA 125: Cancer Antigen (CA) 125: 11.2 U/mL (ref 0.0–38.1)

## 2022-03-30 ENCOUNTER — Telehealth: Payer: Self-pay | Admitting: Oncology

## 2022-04-01 ENCOUNTER — Ambulatory Visit (INDEPENDENT_AMBULATORY_CARE_PROVIDER_SITE_OTHER)
Admission: RE | Admit: 2022-04-01 | Discharge: 2022-04-01 | Disposition: A | Discharge status: Home / Self Care | Payer: Medicare Other | Source: Ambulatory Visit | Attending: Nurse Practitioner | Admitting: Nurse Practitioner

## 2022-04-01 DIAGNOSIS — M8580 Other specified disorders of bone density and structure, unspecified site: Secondary | ICD-10-CM | POA: Diagnosis not present

## 2022-04-02 ENCOUNTER — Telehealth: Payer: Self-pay | Admitting: Nurse Practitioner

## 2022-04-02 ENCOUNTER — Other Ambulatory Visit: Payer: Self-pay | Admitting: Oncology

## 2022-04-02 NOTE — Telephone Encounter (Signed)
Patient called about her appointment for 03/08/22, She said she was told by the office that it would be filed as a wellness check but when she talked to her insurance they said we didn't file it that way. She asked for a call back at [phone]

## 2022-04-05 ENCOUNTER — Other Ambulatory Visit: Payer: Self-pay | Admitting: Nurse Practitioner

## 2022-04-05 DIAGNOSIS — Z1231 Encounter for screening mammogram for malignant neoplasm of breast: Secondary | ICD-10-CM

## 2022-04-05 NOTE — Telephone Encounter (Signed)
Spoke to the patient re: medicare well visit, informed her that as she had request, Romilda Garret, NP did file the visit under code [phone] medicare wellness visit and documented it in the office notes for that date of service  Patient states she would call her insurance company back  I also informed the patient that the claim was still showing with insurance on our end so no final outcome yet

## 2022-04-08 ENCOUNTER — Ambulatory Visit (AMBULATORY_SURGERY_CENTER): Payer: Self-pay | Admitting: *Deleted

## 2022-04-08 ENCOUNTER — Other Ambulatory Visit: Payer: Self-pay | Admitting: Gastroenterology

## 2022-04-08 VITALS — Ht 64.0 in | Wt 152.0 lb

## 2022-04-08 DIAGNOSIS — Z860100 Personal history of colon polyps, unspecified: Secondary | ICD-10-CM

## 2022-04-08 MED ORDER — PLENVU 140 G PO SOLR: 1.0000 | ORAL | 0 refills | Status: DC

## 2022-04-08 NOTE — Progress Notes (Signed)
No egg or soy allergy known to patient  No issues known to pt with past sedation with any surgeries or procedures Patient denies ever being told they had issues or difficulty with intubation  No FH of Malignant Hyperthermia Pt is not on diet pills Pt is not on  home 02  Pt is not on blood thinners  Pt denies issues with constipation  No A fib or A flutter   Discussed with pt there will be an out-of-pocket cost for prep and that varies from $0 to 70 +  dollars - pt verbalized understanding  Pt given Plenvu coupon today and instructed to give to pharmacy when picking up prep  PV completed in person. Pt verified name, DOB.  Procedure explained to pt. Prep instructions reviewed, questions answered. Pt encouraged to call with questions or issues.  If pt has My chart, procedure instructions sent via My Chart

## 2022-04-13 ENCOUNTER — Telehealth: Payer: Self-pay | Admitting: Gastroenterology

## 2022-04-13 ENCOUNTER — Telehealth: Payer: Self-pay

## 2022-04-13 DIAGNOSIS — Z860100 Personal history of colon polyps, unspecified: Secondary | ICD-10-CM

## 2022-04-13 MED ORDER — PLENVU 140 G PO SOLR: 1.0000 | ORAL | 0 refills | Status: DC

## 2022-04-13 NOTE — Telephone Encounter (Signed)
[name] [name],   I have reviewed your bone density study. It shows weaker bones but not osteoporosis as of yet. It does give you the risk of a hip fracture over the next 10 years as being 5.3%. Rule of thumb we start you on prescription medication when the score gets over 3%. Have you been on anything for your bones in the past like fosamax, boniva, or injectables like prolia? Would you be interested in starting a medication to help strengthen the bones? Let me know   Thanks, Romilda Garret, DNP, AGNP-C  -Spoke with patient. Patient has not taking anything for bones in the past besides vitamin D. Patient wanted to know if these medications could cause another cancer for her or make current cancer worse-she has ovarian cancer and going through treatments and follow up with her oncologist. She is afraid to add something on to make things worse or affect something negatively. Advised patient I would ask Catalina Antigua and that he may need to speak with her oncologist to figure out a plan.

## 2022-04-13 NOTE — Telephone Encounter (Signed)
Spoke with pt- sent in another Plenvu script with the Plenvu Medicare coupon in script- pt aware

## 2022-04-13 NOTE — Telephone Encounter (Signed)
Patient was prescribed Plenvu.  Even with Good Rx, it is going to cost her $160.  She is asking for a less expensive alternative.  Please call patient and advise.  Thank you.

## 2022-04-14 ENCOUNTER — Telehealth: Payer: Self-pay | Admitting: Nurse Practitioner

## 2022-04-14 NOTE — Telephone Encounter (Signed)
Dr. Benay Spice,  I see [name] [name] and recently did a DEXA scan that showed 5.3% risk of hip fracture. I was going to place her on therapy and is hesitant and would like to see what your recommendation is for an agent if you have a preference. Thanks for looking at this  [facility]

## 2022-04-14 NOTE — Telephone Encounter (Signed)
Waiting for response.

## 2022-04-14 NOTE — Telephone Encounter (Signed)
I have sent a message to Dr. Benay Spice to get his advice. She should be fine on which ever treatment we chose but can wait until we hear back from him

## 2022-04-23 ENCOUNTER — Ambulatory Visit (AMBULATORY_SURGERY_CENTER): Payer: Medicare Other | Admitting: Gastroenterology

## 2022-04-23 ENCOUNTER — Encounter: Payer: Self-pay | Admitting: Gastroenterology

## 2022-04-23 VITALS — BP 111/70 | HR 56 | Temp 97.3°F | Resp 13 | Ht 63.0 in | Wt 152.0 lb

## 2022-04-23 DIAGNOSIS — F32A Depression, unspecified: Secondary | ICD-10-CM | POA: Diagnosis not present

## 2022-04-23 DIAGNOSIS — Z86010 Personal history of colonic polyps: Secondary | ICD-10-CM

## 2022-04-23 DIAGNOSIS — Z09 Encounter for follow-up examination after completed treatment for conditions other than malignant neoplasm: Secondary | ICD-10-CM | POA: Diagnosis not present

## 2022-04-23 DIAGNOSIS — Z860101 Personal history of adenomatous and serrated colon polyps: Secondary | ICD-10-CM

## 2022-04-23 DIAGNOSIS — F419 Anxiety disorder, unspecified: Secondary | ICD-10-CM | POA: Diagnosis not present

## 2022-04-23 DIAGNOSIS — M069 Rheumatoid arthritis, unspecified: Secondary | ICD-10-CM | POA: Diagnosis not present

## 2022-04-23 MED ORDER — SODIUM CHLORIDE 0.9 % IV SOLN: 500.0000 mL | INTRAVENOUS | Status: DC

## 2022-04-23 NOTE — Telephone Encounter (Signed)
Spoke with Manuela Schwartz at Dr Gearldine Shown office to follow up on the note that was sent to the provider from [place] on 04/14/22. She will follow up and send reply to [facility] via staff message about this.  FYI to [facility] so the reply can be added to this note.

## 2022-04-23 NOTE — Progress Notes (Signed)
See 03/26/2022 H&P, no changes

## 2022-04-23 NOTE — Op Note (Signed)
[place] Patient Name: [name] [name] Procedure Date: 04/23/2022 8:46 AM MRN: [id number] Endoscopist: [name] , MD Age: 71 Referring MD:  Date of Birth: 07/16/1951 Gender: Female Account #: [id number] Procedure:                Colonoscopy Indications:              Surveillance: Personal history of adenomatous                            polyps on last colonoscopy > 5 years ago Medicines:                Monitored Anesthesia Care Procedure:                Pre-Anesthesia Assessment:                           - Prior to the procedure, a History and Physical                            was performed, and patient medications and                            allergies were reviewed. The patient's tolerance of                            previous anesthesia was also reviewed. The risks                            and benefits of the procedure and the sedation                            options and risks were discussed with the patient.                            All questions were answered, and informed consent                            was obtained. Prior Anticoagulants: The patient has                            taken no previous anticoagulant or antiplatelet                            agents. ASA Grade Assessment: III - A patient with                            severe systemic disease. After reviewing the risks                            and benefits, the patient was deemed in                            satisfactory condition to undergo the procedure.  After obtaining informed consent, the colonoscope                            was passed under direct vision. Throughout the                            procedure, the patient's blood pressure, pulse, and                            oxygen saturations were monitored continuously. The                            Olympus PCF-H190DL ([id number]) Colonoscope was                            introduced through the  anus and advanced to the the                            cecum, identified by appendiceal orifice and                            ileocecal valve. The ileocecal valve, appendiceal                            orifice, and rectum were photographed. The quality                            of the bowel preparation was good. The colonoscopy                            was performed without difficulty. The patient                            tolerated the procedure well. Scope In: 8:57:11 AM Scope Out: 9:09:03 AM Scope Withdrawal Time: 0 hours 8 minutes 54 seconds  Total Procedure Duration: 0 hours 11 minutes 52 seconds  Findings:                 The perianal and digital rectal examinations were                            normal.                           The exam was otherwise without abnormality on                            direct and retroflexion views.                           There was evidence of a prior end-to-end                            colo-colonic anastomosis in the sigmoid colon. This  was patent and was characterized by healthy                            appearing mucosa. Complications:            No immediate complications. Estimated blood loss:                            None. Estimated Blood Loss:     Estimated blood loss: none. Impression:               - The examination was otherwise normal on direct                            and retroflexion views.                           - Patent end-to-end colo-colonic anastomosis,                            characterized by healthy appearing mucosa.                           - No specimens collected. Recommendation:           - Patient has a contact number available for                            emergencies. The signs and symptoms of potential                            delayed complications were discussed with the                            patient. Return to normal activities tomorrow.                             Written discharge instructions were provided to the                            patient.                           - Resume previous diet.                           - Continue present medications.                           - No repeat colonoscopy due to age and the absence                            of colonic polyps. [name], MD 04/23/2022 9:14:29 AM This report has been signed electronically.

## 2022-04-23 NOTE — Progress Notes (Signed)
Report to PACU, RN, vss, BBS= Clear.  

## 2022-04-23 NOTE — Patient Instructions (Signed)
Resume previous diet and medications.  No repeat colonoscopy due to age and absence of colonic polyps.   YOU HAD AN ENDOSCOPIC PROCEDURE TODAY AT [facility]:   Refer to the procedure report that was given to you for any specific questions about what was found during the examination.  If the procedure report does not answer your questions, please call your gastroenterologist to clarify.  If you requested that your care partner not be given the details of your procedure findings, then the procedure report has been included in a sealed envelope for you to review at your convenience later.  YOU SHOULD EXPECT: Some feelings of bloating in the abdomen. Passage of more gas than usual.  Walking can help get rid of the air that was put into your GI tract during the procedure and reduce the bloating. If you had a lower endoscopy (such as a colonoscopy or flexible sigmoidoscopy) you may notice spotting of blood in your stool or on the toilet paper. If you underwent a bowel prep for your procedure, you may not have a normal bowel movement for a few days.  Please Note:  You might notice some irritation and congestion in your nose or some drainage.  This is from the oxygen used during your procedure.  There is no need for concern and it should clear up in a day or so.  SYMPTOMS TO REPORT IMMEDIATELY:  Following lower endoscopy (colonoscopy or flexible sigmoidoscopy):  Excessive amounts of blood in the stool  Significant tenderness or worsening of abdominal pains  Swelling of the abdomen that is new, acute  Fever of 100F or higher   For urgent or emergent issues, a gastroenterologist can be reached at any hour by calling [phone]. Do not use MyChart messaging for urgent concerns.    DIET:  We do recommend a small meal at first, but then you may proceed to your regular diet.  Drink plenty of fluids but you should avoid alcoholic beverages for 24 hours.  ACTIVITY:  You should plan  to take it easy for the rest of today and you should NOT DRIVE or use heavy machinery until tomorrow (because of the sedation medicines used during the test).    FOLLOW UP: Our staff will call the number listed on your records the next business day following your procedure.  We will call around 7:15- 8:00 am to check on you and address any questions or concerns that you may have regarding the information given to you following your procedure. If we do not reach you, we will leave a message.  If you develop any symptoms (ie: fever, flu-like symptoms, shortness of breath, cough etc.) before then, please call [phone].  If you test positive for Covid 19 in the 2 weeks post procedure, please call and report this information to [place].    If any biopsies were taken you will be contacted by phone or by letter within the next 1-3 weeks.  Please call us at [phone] if you have not heard about the biopsies in 3 weeks.    SIGNATURES/CONFIDENTIALITY: You and/or your care partner have signed paperwork which will be entered into your electronic medical record.  These signatures attest to the fact that that the information above on your After Visit Summary has been reviewed and is understood.  Full responsibility of the confidentiality of this discharge information lies with you and/or your care-partner.

## 2022-04-26 ENCOUNTER — Telehealth: Payer: Self-pay | Admitting: Nurse Practitioner

## 2022-04-26 ENCOUNTER — Telehealth: Payer: Self-pay | Admitting: *Deleted

## 2022-04-26 DIAGNOSIS — M8580 Other specified disorders of bone density and structure, unspecified site: Secondary | ICD-10-CM

## 2022-04-26 NOTE — Telephone Encounter (Signed)
Can we inform the patient that Dr. Benay Spice did not have a specific recommendation in mind for the bone loss. Just whatever is affordable and tolerated by the patient. I traditionally use a medication called Alendronate. You take it once a week and it is generic. Let me know her thoughts and answers

## 2022-04-26 NOTE — Telephone Encounter (Signed)
Attempted to call patient for their post-procedure follow-up call. No answer. Left voicemail.   

## 2022-04-26 NOTE — Telephone Encounter (Signed)
Left message for patient to call back  

## 2022-04-28 ENCOUNTER — Ambulatory Visit
Admission: RE | Admit: 2022-04-28 | Discharge: 2022-04-28 | Disposition: A | Discharge status: Home / Self Care | Payer: Medicare Other | Source: Ambulatory Visit | Attending: Nurse Practitioner | Admitting: Nurse Practitioner

## 2022-04-28 DIAGNOSIS — Z1231 Encounter for screening mammogram for malignant neoplasm of breast: Secondary | ICD-10-CM

## 2022-04-28 NOTE — Telephone Encounter (Signed)
Left message to call back  

## 2022-05-03 MED ORDER — ALENDRONATE SODIUM 70 MG PO TABS: 70.0000 mg | ORAL_TABLET | ORAL | 11 refills | Status: DC

## 2022-05-03 NOTE — Addendum Note (Signed)
Addended by: Michela Pitcher on: 05/03/2022 02:04 PM   Modules accepted: Orders

## 2022-05-03 NOTE — Telephone Encounter (Signed)
Medication sent to pharmacy of request

## 2022-05-03 NOTE — Telephone Encounter (Signed)
Patient advised. Patient agrees to trying Alendronate and to please send to [facility]

## 2022-05-18 ENCOUNTER — Ambulatory Visit: Payer: Medicare Other | Admitting: Oncology

## 2022-05-18 ENCOUNTER — Other Ambulatory Visit: Payer: Medicare Other

## 2022-05-24 ENCOUNTER — Inpatient Hospital Stay: Payer: Medicare Other

## 2022-05-25 ENCOUNTER — Inpatient Hospital Stay: Payer: Medicare Other

## 2022-05-25 ENCOUNTER — Inpatient Hospital Stay: Payer: Medicare Other | Attending: Oncology | Admitting: Oncology

## 2022-05-25 ENCOUNTER — Encounter: Payer: Self-pay | Admitting: Oncology

## 2022-05-25 VITALS — BP 121/67 | HR 78 | Temp 98.1°F | Resp 20 | Ht 63.0 in | Wt 151.6 lb

## 2022-05-25 DIAGNOSIS — C786 Secondary malignant neoplasm of retroperitoneum and peritoneum: Secondary | ICD-10-CM | POA: Diagnosis not present

## 2022-05-25 DIAGNOSIS — C569 Malignant neoplasm of unspecified ovary: Secondary | ICD-10-CM

## 2022-05-25 DIAGNOSIS — G620 Drug-induced polyneuropathy: Secondary | ICD-10-CM | POA: Insufficient documentation

## 2022-05-25 DIAGNOSIS — Z95828 Presence of other vascular implants and grafts: Secondary | ICD-10-CM

## 2022-05-25 DIAGNOSIS — Z452 Encounter for adjustment and management of vascular access device: Secondary | ICD-10-CM | POA: Insufficient documentation

## 2022-05-25 MED ORDER — HEPARIN SOD (PORK) LOCK FLUSH 100 UNIT/ML IV SOLN
500.0000 [IU] | Freq: Once | INTRAVENOUS | Status: AC
  Administered 2022-05-25: 500 [IU] via INTRAVENOUS

## 2022-05-25 MED ORDER — SODIUM CHLORIDE 0.9% FLUSH
10.0000 mL | Freq: Once | INTRAVENOUS | Status: AC
  Administered 2022-05-25: 10 mL via INTRAVENOUS

## 2022-05-25 NOTE — Patient Instructions (Signed)

## 2022-05-25 NOTE — Progress Notes (Signed)
[facility] OFFICE PROGRESS NOTE   Diagnosis: Ovarian cancer  INTERVAL HISTORY:   [name] [name] returns as scheduled.  She just returned from a 5-day cruise.  Good appetite.  No difficulty with bowel function.  She continues to have intermittent mild discomfort in the right lower abdomen.  She has occasional dizziness.  She has persistent numbness in the fingers.  Objective:  Vital signs in last 24 hours:  Blood pressure 121/67, pulse 78, temperature 98.1 F (36.7 C), temperature source Oral, resp. rate 20, height _0  (1.6 m), weight 151 lb 9.6 oz (68.8 kg), SpO2 98 %.    Lymphatics: No cervical, supraclavicular, axillary, or inguinal nodes  Resp: Lungs clear bilaterally Cardio: Regular rate and rhythm GI: No hepatosplenomegaly, mild tenderness in the right low abdomen near the pelvic brim without a discrete mass, no apparent ascites Vascular: No leg edema  Portacath/PICC-without erythema  Lab Results:  Lab Results  Component Value Date   WBC 5.0 03/25/2022   HGB 12.9 03/25/2022   HCT 39.9 03/25/2022   MCV 104.5 (H) 03/25/2022   PLT 193 03/25/2022   NEUTROABS 3.4 03/25/2022    CMP  Lab Results  Component Value Date   NA 137 03/25/2022   K 3.8 03/25/2022   CL 101 03/25/2022   CO2 27 03/25/2022   GLUCOSE 80 03/25/2022   BUN 18 03/25/2022   CREATININE 0.68 03/25/2022   CALCIUM 9.1 03/25/2022   PROT 6.5 03/25/2022   ALBUMIN 4.0 03/25/2022   AST 20 03/25/2022   ALT 19 03/25/2022   ALKPHOS 53 03/25/2022   BILITOT 0.6 03/25/2022   GFRNONAA >60 03/25/2022   GFRAA >60 03/13/2020    Lab Results  Component Value Date   CEA 0.8 07/13/2012   CA125 8 08/18/2016     Medications: I have reviewed the patient's current medications.   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 A 5 mm  nodules remain on the right diaphragm. - TumorNext paired germline/tumor analyses: No somatic variants detected, germline CHEK2      VUS      Cycle 1 of adjuvant Taxol/carboplatin chemotherapy initiated on 09/19/2012.   The CA 125 normalized.   She completed day 15 of cycle 6 on 02/06/2013.   Restaging CT evaluation 03/29/2013 showed no evidence of metastatic disease in the chest. There was marked improvement in appearance/resolution of previous described peritoneal/omental disease. There was no convincing evidence of residual disease. There was minimal increased density in the region of the omentum favored to be treatment-related. There was no pelvic adenopathy. CA125 3.2 on 02/19/2014. 08/18/2016 CA-125 8 CT abdomen/pelvis 08/25/2016-solitary new enlarged right external iliac lymph node measuring 2.2 cm. Biopsy 09/07/2016-adenocarcinoma consistent with high-grade serous carcinoma. PET scan 09/22/2016-malignant range FDG uptake associated with the enlarged right external iliac lymph node compatible with metastatic adenopathy.  No additional sites of metastatic disease identified. Radiation 10/25/2016-12/01/2016 PET scan 03/28/2017-previously enlarged hypermetabolic right external iliac node-normal in size with resolution of hypermetabolic activity, no active malignancy identified PET scan 05/15/2018-no evidence of recurrent or metastatic disease, no hypermetabolic lymph nodes CT abdomen/pelvis 09/19/2019-mild increase in the size of several left upper quadrant peritoneal nodules measuring up to 11 mm.  No other sites of metastatic disease identified.  No ascites. CT abdomen/pelvis 03/14/2020-slight enlargement of several left upper quadrant peritoneal nodules, no ascites, no other evidence of disease progression CT abdomen/pelvis 09/12/2020 peritoneal nodularity/omental  caking predominantly in the left upper/mid abdomen, mildly progressive.  Largest implant in the left upper abdomen adjacent to the  stomach now measures 17 mm, previously 13 mm.  Overall volume of peritoneal disease has mildly progressed. CT abdomen/pelvis 01/13/2021- 4 mm right ureteral calculus with moderate right hydronephrosis and upper right hydroureter, progressive omental nodularity, stable calcified and partially calcified right pelvic nodules CT abdomen/pelvis 05/13/2021-enlargement of left upper quadrant omental nodules and a peritoneal nodule at the right iliac fossa, no ascites CT abdomen/pelvis 08/14/2021-mild increase in size of omental nodules, no ascites, no new site of metastatic disease Taxol/carboplatin 09/09/2021, 09/15/2021, 09/22/2021 Taxol/carboplatin 10/07/2021, 10/13/2021, 10/20/2021 Taxol/Carboplatin 11/03/2021, 11/10/2021, 11/17/2021 CT 11/23/2021-decrease in peritoneal metastases, no new or progressive disease, airspace opacity at both lung bases Taxol/carboplatin 12/01/2021, 12/08/2021, 12/15/2021 Taxol/Carboplatin every 2 weeks beginning 12/29/2021 Carboplatin alone 01/26/2022, Taxol held due to neuropathy Carboplatin alone 02/09/2022, Taxol held due to neuropathy Carboplatin alone 02/23/2022, Taxol held due to neuropathy Carboplatin alone 03/09/2022, Taxol held due to neuropathy CTs 03/25/2022-no significant change in omental, mesenteric disease.  No new or clearly progressive findings.  Previously noted patchy areas of consolidative opacity at the lung bases have resolved. Treatment break beginning 03/27/2019   2. Low abdomen/suprapubic pain prior to the exploratory laparotomy-likely secondary to omental/pelvic tumor; persistent mild pain in the lower abdomen   3. Chronic neck and back pain.   4. Anxiety -persistent despite Lexapro and Xanax. She has been evaluated by psychiatry. Currently on Lexapro. 5. Status post Port-A-Cath placement 09/22/2012. The Port-A-Cath was removed on 04/03/2013.   6. Neutropenia secondary to chemotherapy- day 15 cycle 1 and cycle 3. Taxol/carboplatin not given secondary to neutropenia.     7. Herpes zoster involving a right thoracic dermatome July 2015. She completed a course of Valtrex. 8. Nodular bony prominence at the left pelvis on rectal exam 06/05/2015-likely a benign finding 9.  Right ureter stone/hydroureteronephrosis on CT 01/13/2021-referred to urology; status post right ureteroscopy with stone extraction and ureteral stent placement.  Stent removal 03/11/2021 10.  Taxol neuropathy      Disposition: Ms. Hayton has a history of ovarian cancer.  She is currently maintained off of systemic therapy.  Her overall clinical status appears unchanged.  We will follow-up on the CA125 from today.  She will return for an office visit, CA125, and Port-A-Cath flush in 6 weeks.  She will call in the interim for new symptoms.  We will plan for a restaging CT after the next office visit.  [name], MD  05/25/2022  3:01 PM

## 2022-05-26 ENCOUNTER — Telehealth: Payer: Self-pay

## 2022-05-26 LAB — CA 125: Cancer Antigen (CA) 125: 14.3 U/mL (ref 0.0–38.1)

## 2022-05-26 NOTE — Telephone Encounter (Signed)
-----   Message from Ladell Pier, MD sent at 05/26/2022  3:13 PM EDT ----- Please call patient, the CA125 is normal, follow-up as scheduled

## 2022-05-26 NOTE — Telephone Encounter (Signed)
Pt verbalized understanding.

## 2022-05-27 ENCOUNTER — Ambulatory Visit: Payer: Medicare Other | Admitting: Nurse Practitioner

## 2022-06-02 ENCOUNTER — Telehealth: Payer: Self-pay | Admitting: Nurse Practitioner

## 2022-06-02 ENCOUNTER — Ambulatory Visit: Payer: Medicare Other | Admitting: Nurse Practitioner

## 2022-06-02 VITALS — BP 124/66 | HR 78 | Temp 97.2°F | Resp 14 | Ht 61.0 in | Wt 150.2 lb

## 2022-06-02 DIAGNOSIS — T63441A Toxic effect of venom of bees, accidental (unintentional), initial encounter: Secondary | ICD-10-CM | POA: Insufficient documentation

## 2022-06-02 DIAGNOSIS — L239 Allergic contact dermatitis, unspecified cause: Secondary | ICD-10-CM | POA: Insufficient documentation

## 2022-06-02 MED ORDER — PREDNISONE 20 MG PO TABS: ORAL_TABLET | ORAL | 0 refills | Status: AC

## 2022-06-02 MED ORDER — LORATADINE 10 MG PO TABS: 10.0000 mg | ORAL_TABLET | Freq: Every day | ORAL | 0 refills | Status: DC

## 2022-06-02 MED ORDER — METHYLPREDNISOLONE ACETATE 40 MG/ML IJ SUSP
40.0000 mg | Freq: Once | INTRAMUSCULAR | Status: AC
  Administered 2022-06-02: 40 mg via INTRAMUSCULAR

## 2022-06-02 NOTE — Assessment & Plan Note (Signed)
Maculopapular rash to anterior chest.  Unsure if this is related to the bee sting patient sustained or something different.  Patient's been no changes.  Will administer Depo-Medrol 40 mg IM x1 dose and place patient on prednisone taper.  Steroid precautions reviewed

## 2022-06-02 NOTE — Telephone Encounter (Signed)
Patient is scheduled to see [name] [name] today at 12 noon   Page: 1 of 2 Call Id: [id number] [facility] AccessNurse Patient Name: [name] [name] ON Gender: Female DOB: [birth year] Age: 71 Y 70 M 14 D Return Phone Number: [phone] (Primary) Address: City/ State/ Zip: [place]  [address] Client [facility] - Client Client Site [facility] - Day Provider [name] [name]- NP Contact Type Call Who Is Calling Patient / Member / Family / Caregiver Call Type Triage / Clinical Relationship To Patient Self Return Phone Number [phone] (Primary) Chief Complaint Bee, Wasp, or Yellow Jacket Sting (no allergic symptoms) Reason for Call Symptomatic / Request for [place] states she was stung last week by a bee and has been having itchiness and feverish. Translation No Nurse Assessment Nurse: [name], RN, Cathy Date/Time (Eastern Time): 06/02/2022 9:39:43 AM Confirm and document reason for call. If symptomatic, describe symptoms. ---[name] states she had a bee sting to her neck last week that looks red, inflamed and infected. No severe breathing or swallowing difficulty. No fever. Alert and responsive. Does the patient have any new or worsening symptoms? ---Yes Will a triage be completed? ---Yes Related visit to physician within the last 2 weeks? ---No Does the PT have any chronic conditions? (i.e. diabetes, asthma, this includes High risk factors for pregnancy, etc.) ---Yes List chronic conditions. ---Ovarian Cancer Is this a behavioral health or substance abuse call? ---No Guidelines Guideline Title Affirmed Question Affirmed Notes Nurse Date/Time (Eastern Time) Bee or Yellow Jacket Sting [1] Red streak or red line AND [2] length > 2 inches (5 cm) [name], RN, Cathy 06/02/2022 9:42:06 AM Disp. Time [name] Time) Disposition Final  User 06/02/2022 9:45:09 AM See HCP within 4 Hours (or PCP triage) Yes [name], RN, [name] PLEASE NOTE: All timestamps contained within this report are represented as [place] Standard Time. CONFIDENTIALTY NOTICE: This fax transmission is intended only for the addressee. It contains information that is legally privileged, confidential or otherwise protected from use or disclosure. If you are not the intended recipient, you are strictly prohibited from reviewing, disclosing, copying using or disseminating any of this information or taking any action in reliance on or regarding this information. If you have received this fax in error, please notify us immediately by telephone so that we can arrange for its return to [place]. Phone: [phone], Toll-Free: [phone], Fax: [phone] Page: 2 of 2 Call Id: [id number] Final Disposition 06/02/2022 9:45:09 AM See HCP within 4 Hours (or PCP triage) Yes [name], RN, [name] Disagree/Comply Comply Caller Understands Yes PreDisposition Call Doctor Care Advice Given Per Guideline SEE HCP (OR PCP TRIAGE) WITHIN 4 HOURS: * IF OFFICE WILL BE OPEN: You need to be seen within the next 3 or 4 hours. Call your doctor (or NP/PA) now or as soon as the office opens. CALL BACK IF: * You become worse CARE ADVICE given per Bee or Yellow Jacket Sting (Adult) guideline. Comments User: [name], RN Date/Time [name] Time): 06/02/2022 9:50:04 AM [name] hung up while on hold. [name] at the office will call her back. Referrals REFERRED TO PCP OFFICE

## 2022-06-02 NOTE — Progress Notes (Signed)
Acute Office Visit  Subjective:     Patient ID: [name] [name], female    DOB: 22-Aug-1951, 71 y.o.   MRN: [id number]  Chief Complaint  Patient presents with   Insect Bite    Bee sting about a week ago, upper chest/bottom part of the neck. The last 3 to 4 days has been having itching and burning sensation, redness.    HPI Patient is in today for Bee sting/ Rash  States that she got stung by a bee on the lower part of the neck. States the sting was last Tuesday. States that she and appointment last week and canceled because the swelling went down  States that Sunday evening that it started itching and burning. States that used ice (was effective) and took 1 benadryl  2 days ago that did not seem that effective.  Review of Systems  Constitutional:  Negative for chills and fever.  Respiratory:  Negative for shortness of breath.   Skin:  Positive for itching and rash.        Objective:    BP 124/66   Pulse 78   Temp (!) 97.2 F (36.2 C)   Resp 14   Ht '5\' 1"'$  (1.549 m)   Wt 150 lb 4 oz (68.2 kg)   SpO2 95%   BMI 28.39 kg/m    Physical Exam Vitals and nursing note reviewed.  Constitutional:      Appearance: Normal appearance.  HENT:     Mouth/Throat:     Mouth: Mucous membranes are dry.  Cardiovascular:     Rate and Rhythm: Normal rate and regular rhythm.     Heart sounds: Normal heart sounds.  Pulmonary:     Effort: Pulmonary effort is normal.     Breath sounds: Normal breath sounds.  Lymphadenopathy:     Cervical: No cervical adenopathy.  Skin:    Findings: Rash present.          Comments: Macular/papular erythematous rash  Neurological:     Mental Status: She is alert.     No results found for any visits on 06/02/22.      Assessment & Plan:   Problem List Items Addressed This Visit       Musculoskeletal and Integument   Allergic dermatitis - Primary    Maculopapular rash to anterior chest.  Unsure if this is related to the bee sting patient  sustained or something different.  Patient's been no changes.  Will administer Depo-Medrol 40 mg IM x1 dose and place patient on prednisone taper.  Steroid precautions reviewed      Relevant Medications   methylPREDNISolone acetate (DEPO-MEDROL) injection 40 mg (Start on 06/02/2022 12:30 PM)   predniSONE (DELTASONE) 20 MG tablet   loratadine (CLARITIN) 10 MG tablet     Other   Bee sting    Bee sting to anterior inferior neck.  Will administer Depo-Medrol 40 mg IM x1 dose along with prednisone symptoms taper.  Also start taking loratadine daily.  Follow-up if no improvement      Relevant Medications   predniSONE (DELTASONE) 20 MG tablet   loratadine (CLARITIN) 10 MG tablet    Meds ordered this encounter  Medications   methylPREDNISolone acetate (DEPO-MEDROL) injection 40 mg   predniSONE (DELTASONE) 20 MG tablet    Sig: Take 1 tablet (20 mg total) by mouth 2 (two) times daily with a meal for 3 days, THEN 1 tablet (20 mg total) daily with breakfast for 3 days. Start tomorrow 06/03/2022.  Avoid NSAIDs like: ibuprofen, motrin, aleve, naproxen, BC/Goody powders.    Dispense:  9 tablet    Refill:  0    Order Specific Question:   Supervising Provider    Answer:   Glori Bickers MARNE A [[id number]]   loratadine (CLARITIN) 10 MG tablet    Sig: Take 1 tablet (10 mg total) by mouth daily.    Dispense:  30 tablet    Refill:  0    Order Specific Question:   Supervising Provider    Answer:   TOWER, MARNE A [[id number]]    Return if symptoms worsen or fail to improve.  Romilda Garret, NP

## 2022-06-02 NOTE — Assessment & Plan Note (Signed)
Bee sting to anterior inferior neck.  Will administer Depo-Medrol 40 mg IM x1 dose along with prednisone symptoms taper.  Also start taking loratadine daily.  Follow-up if no improvement

## 2022-06-02 NOTE — Patient Instructions (Signed)
Nice to see you today Start the prednisone pills tomorrow 06/03/2022, take them with food. Avoid NSAIDs like ibuprofen, motrin, aleve, naproxen, BC/Goody powders Follow up if no improvement

## 2022-06-22 ENCOUNTER — Telehealth: Payer: Self-pay | Admitting: Nurse Practitioner

## 2022-06-22 NOTE — Telephone Encounter (Signed)
Patient called in asking about receiving a rx for a scooter due to her back problem,and her cancer diagnosis,she stated that she gives out of breathe a lot. She went to a scooter store and they informed her that if she received an rx from her pcp,she wouldn't have to pay taxes on the scooter. Please advise!

## 2022-06-23 ENCOUNTER — Telehealth: Payer: Self-pay | Admitting: *Deleted

## 2022-06-23 NOTE — Telephone Encounter (Signed)
Patient advised. Appointment made for 06/28/22

## 2022-06-23 NOTE — Telephone Encounter (Signed)
This is likely okay but patient would need an appointment with [facility] to get this started please schedule appointment for in the future for when he comes back

## 2022-06-23 NOTE — Telephone Encounter (Signed)
Patient left VM asking RN to call back: she has questions. Returned call and left VM that return call was attempted.

## 2022-06-28 ENCOUNTER — Ambulatory Visit: Payer: Medicare Other | Admitting: Nurse Practitioner

## 2022-07-06 ENCOUNTER — Inpatient Hospital Stay: Payer: Medicare Other | Admitting: Nurse Practitioner

## 2022-07-06 ENCOUNTER — Inpatient Hospital Stay: Payer: Medicare Other

## 2022-07-06 ENCOUNTER — Inpatient Hospital Stay: Payer: Medicare Other | Attending: Oncology

## 2022-07-06 ENCOUNTER — Encounter: Payer: Self-pay | Admitting: Nurse Practitioner

## 2022-07-06 VITALS — BP 128/73 | HR 92 | Temp 98.1°F | Resp 18 | Ht 61.0 in | Wt 150.0 lb

## 2022-07-06 DIAGNOSIS — C569 Malignant neoplasm of unspecified ovary: Secondary | ICD-10-CM | POA: Insufficient documentation

## 2022-07-06 DIAGNOSIS — F419 Anxiety disorder, unspecified: Secondary | ICD-10-CM | POA: Diagnosis not present

## 2022-07-06 DIAGNOSIS — M549 Dorsalgia, unspecified: Secondary | ICD-10-CM | POA: Diagnosis not present

## 2022-07-06 DIAGNOSIS — G8929 Other chronic pain: Secondary | ICD-10-CM | POA: Diagnosis not present

## 2022-07-06 DIAGNOSIS — G620 Drug-induced polyneuropathy: Secondary | ICD-10-CM | POA: Diagnosis not present

## 2022-07-06 DIAGNOSIS — C786 Secondary malignant neoplasm of retroperitoneum and peritoneum: Secondary | ICD-10-CM | POA: Insufficient documentation

## 2022-07-06 DIAGNOSIS — Z95828 Presence of other vascular implants and grafts: Secondary | ICD-10-CM

## 2022-07-06 DIAGNOSIS — M542 Cervicalgia: Secondary | ICD-10-CM | POA: Diagnosis not present

## 2022-07-06 MED ORDER — HEPARIN SOD (PORK) LOCK FLUSH 100 UNIT/ML IV SOLN
500.0000 [IU] | Freq: Once | INTRAVENOUS | Status: AC
  Administered 2022-07-06: 500 [IU] via INTRAVENOUS

## 2022-07-06 MED ORDER — SODIUM CHLORIDE 0.9% FLUSH
10.0000 mL | INTRAVENOUS | Status: DC | PRN
  Administered 2022-07-06: 10 mL via INTRAVENOUS

## 2022-07-06 NOTE — Progress Notes (Signed)
[facility] OFFICE PROGRESS NOTE   Diagnosis: Ovarian cancer  INTERVAL HISTORY:   [name] [name] returns as scheduled.  She feels well.  No abdominal pain.  She has a good appetite.  Good energy level.  Bowels moving on a regular basis.  No fever, cough, shortness of breath.  Objective:  Vital signs in last 24 hours:  Blood pressure 128/73, pulse 92, temperature 98.1 F (36.7 C), resp. rate 18, height '5\' 1"'  (1.549 m), weight 150 lb (68 kg), SpO2 98 %.    HEENT: No thrush or ulcers. Lymphatics: No palpable cervical, supraclavicular, axillary or inguinal lymph nodes. Resp: Lungs clear bilaterally. Cardio: Regular rate and rhythm. GI: Abdomen soft and nontender.  No hepatosplenomegaly.  No mass. Vascular: No leg edema. Port-A-Cath without erythema.  Lab Results:  Lab Results  Component Value Date   WBC 5.0 03/25/2022   HGB 12.9 03/25/2022   HCT 39.9 03/25/2022   MCV 104.5 (H) 03/25/2022   PLT 193 03/25/2022   NEUTROABS 3.4 03/25/2022    Imaging:  No results found.  Medications: I have reviewed the patient's current medications.  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 A 5 mm nodules remain on the right diaphragm. - TumorNext paired germline/tumor analyses: No somatic variants detected, germline CHEK2      VUS      Cycle 1 of adjuvant Taxol/carboplatin chemotherapy initiated on 09/19/2012.   The CA 125 normalized.   She completed day 15 of cycle 6 on 02/06/2013.   Restaging CT evaluation 03/29/2013 showed no evidence of metastatic disease in the chest. There was marked improvement in appearance/resolution of previous described peritoneal/omental disease. There was no convincing evidence of residual disease. There was minimal increased density in the region of the omentum favored to be  treatment-related. There was no pelvic adenopathy. CA125 3.2 on 02/19/2014. 08/18/2016 CA-125 8 CT abdomen/pelvis 08/25/2016-solitary new enlarged right external iliac lymph node measuring 2.2 cm. Biopsy 09/07/2016-adenocarcinoma consistent with high-grade serous carcinoma. PET scan 09/22/2016-malignant range FDG uptake associated with the enlarged right external iliac lymph node compatible with metastatic adenopathy.  No additional sites of metastatic disease identified. Radiation 10/25/2016-12/01/2016 PET scan 03/28/2017-previously enlarged hypermetabolic right external iliac node-normal in size with resolution of hypermetabolic activity, no active malignancy identified PET scan 05/15/2018-no evidence of recurrent or metastatic disease, no hypermetabolic lymph nodes CT abdomen/pelvis 09/19/2019-mild increase in the size of several left upper quadrant peritoneal nodules measuring up to 11 mm.  No other sites of metastatic disease identified.  No ascites. CT abdomen/pelvis 03/14/2020-slight enlargement of several left upper quadrant peritoneal nodules, no ascites, no other evidence of disease progression CT abdomen/pelvis 09/12/2020 peritoneal nodularity/omental caking predominantly in the left upper/mid abdomen, mildly progressive.  Largest implant in the left upper abdomen adjacent to the stomach now measures 17 mm, previously 13 mm.  Overall volume of peritoneal disease has mildly progressed. CT abdomen/pelvis 01/13/2021- 4 mm right ureteral calculus with moderate right hydronephrosis and upper right hydroureter, progressive omental nodularity, stable calcified and partially calcified right pelvic nodules CT abdomen/pelvis 05/13/2021-enlargement of left upper quadrant omental nodules and a peritoneal nodule at the right iliac fossa, no ascites CT abdomen/pelvis 08/14/2021-mild increase in size of omental nodules, no ascites, no new site of metastatic disease Taxol/carboplatin 09/09/2021, 09/15/2021,  09/22/2021 Taxol/carboplatin 10/07/2021, 10/13/2021, 10/20/2021 Taxol/Carboplatin 11/03/2021, 11/10/2021, 11/17/2021 CT 11/23/2021-decrease in peritoneal metastases, no new or progressive disease, airspace opacity at  both lung bases Taxol/carboplatin 12/01/2021, 12/08/2021, 12/15/2021 Taxol/Carboplatin every 2 weeks beginning 12/29/2021 Carboplatin alone 01/26/2022, Taxol held due to neuropathy Carboplatin alone 02/09/2022, Taxol held due to neuropathy Carboplatin alone 02/23/2022, Taxol held due to neuropathy Carboplatin alone 03/09/2022, Taxol held due to neuropathy CTs 03/25/2022-no significant change in omental, mesenteric disease.  No new or clearly progressive findings.  Previously noted patchy areas of consolidative opacity at the lung bases have resolved. Treatment break beginning 03/27/2019   2. Low abdomen/suprapubic pain prior to the exploratory laparotomy-likely secondary to omental/pelvic tumor; persistent mild pain in the lower abdomen   3. Chronic neck and back pain.   4. Anxiety -persistent despite Lexapro and Xanax. She has been evaluated by psychiatry. Currently on Lexapro. 5. Status post Port-A-Cath placement 09/22/2012. The Port-A-Cath was removed on 04/03/2013.   6. Neutropenia secondary to chemotherapy- day 15 cycle 1 and cycle 3. Taxol/carboplatin not given secondary to neutropenia.    7. Herpes zoster involving a right thoracic dermatome July 2015. She completed a course of Valtrex. 8. Nodular bony prominence at the left pelvis on rectal exam 06/05/2015-likely a benign finding 9.  Right ureter stone/hydroureteronephrosis on CT 01/13/2021-referred to urology; status post right ureteroscopy with stone extraction and ureteral stent placement.  Stent removal 03/11/2021 10.  Taxol neuropathy    Disposition: [name] [name] appears stable.  There is no clinical evidence of disease progression.  We will follow-up on the CA125 from today.  Plan for restaging CTs in early November after she returns from a  vacation.  She will return for follow-up visit 08/12/2022.  We are available to see her sooner if needed.    Ned Card ANP/GNP-BC   07/06/2022  2:08 PM

## 2022-07-06 NOTE — Patient Instructions (Signed)

## 2022-07-08 LAB — CA 125: Cancer Antigen (CA) 125: 24.1 U/mL (ref 0.0–38.1)

## 2022-07-21 DIAGNOSIS — H5203 Hypermetropia, bilateral: Secondary | ICD-10-CM | POA: Diagnosis not present

## 2022-07-21 DIAGNOSIS — H524 Presbyopia: Secondary | ICD-10-CM | POA: Diagnosis not present

## 2022-07-21 DIAGNOSIS — H2513 Age-related nuclear cataract, bilateral: Secondary | ICD-10-CM | POA: Diagnosis not present

## 2022-08-10 ENCOUNTER — Inpatient Hospital Stay: Payer: Medicare Other

## 2022-08-10 ENCOUNTER — Inpatient Hospital Stay: Payer: Medicare Other | Attending: Oncology

## 2022-08-10 ENCOUNTER — Ambulatory Visit (HOSPITAL_BASED_OUTPATIENT_CLINIC_OR_DEPARTMENT_OTHER)
Admission: RE | Admit: 2022-08-10 | Discharge: 2022-08-10 | Disposition: A | Discharge status: Home / Self Care | Payer: Medicare Other | Source: Ambulatory Visit | Attending: Nurse Practitioner | Admitting: Nurse Practitioner

## 2022-08-10 DIAGNOSIS — C786 Secondary malignant neoplasm of retroperitoneum and peritoneum: Secondary | ICD-10-CM | POA: Insufficient documentation

## 2022-08-10 DIAGNOSIS — C569 Malignant neoplasm of unspecified ovary: Secondary | ICD-10-CM | POA: Diagnosis not present

## 2022-08-10 DIAGNOSIS — G620 Drug-induced polyneuropathy: Secondary | ICD-10-CM | POA: Insufficient documentation

## 2022-08-10 DIAGNOSIS — D701 Agranulocytosis secondary to cancer chemotherapy: Secondary | ICD-10-CM | POA: Insufficient documentation

## 2022-08-10 DIAGNOSIS — R197 Diarrhea, unspecified: Secondary | ICD-10-CM | POA: Insufficient documentation

## 2022-08-10 DIAGNOSIS — Z95828 Presence of other vascular implants and grafts: Secondary | ICD-10-CM

## 2022-08-10 LAB — BASIC METABOLIC PANEL - CANCER CENTER ONLY
Anion gap: 8 (ref 5–15)
BUN: 26 mg/dL — ABNORMAL HIGH (ref 8–23)
CO2: 28 mmol/L (ref 22–32)
Calcium: 9.8 mg/dL (ref 8.9–10.3)
Chloride: 104 mmol/L (ref 98–111)
Creatinine: 0.65 mg/dL (ref 0.44–1.00)
GFR, Estimated: >60 mL/min (ref >60)
Glucose, Bld: 87 mg/dL (ref 70–99)
Potassium: 4 mmol/L (ref 3.5–5.1)
Sodium: 140 mmol/L (ref 135–145)

## 2022-08-10 MED ORDER — HEPARIN SOD (PORK) LOCK FLUSH 100 UNIT/ML IV SOLN
500.0000 [IU] | Freq: Once | INTRAVENOUS | Status: AC
  Administered 2022-08-10: 500 [IU] via INTRAVENOUS

## 2022-08-10 MED ORDER — IOHEXOL 300 MG/ML  SOLN
100.0000 mL | Freq: Once | INTRAMUSCULAR | Status: AC | PRN
  Administered 2022-08-10: 100 mL via INTRAVENOUS

## 2022-08-10 NOTE — Patient Instructions (Signed)

## 2022-08-10 NOTE — Addendum Note (Signed)
Addended by: Tedd Sias on: 08/10/2022 10:43 AM   Modules accepted: Orders

## 2022-08-11 LAB — CA 125: Cancer Antigen (CA) 125: 41.2 U/mL — ABNORMAL HIGH (ref 0.0–38.1)

## 2022-08-12 ENCOUNTER — Inpatient Hospital Stay: Payer: Medicare Other | Admitting: Oncology

## 2022-08-12 VITALS — BP 128/71 | HR 89 | Temp 98.1°F | Resp 18 | Ht 61.0 in | Wt 152.7 lb

## 2022-08-12 DIAGNOSIS — C569 Malignant neoplasm of unspecified ovary: Secondary | ICD-10-CM

## 2022-08-12 DIAGNOSIS — R197 Diarrhea, unspecified: Secondary | ICD-10-CM | POA: Diagnosis not present

## 2022-08-12 DIAGNOSIS — G620 Drug-induced polyneuropathy: Secondary | ICD-10-CM | POA: Diagnosis not present

## 2022-08-12 DIAGNOSIS — C786 Secondary malignant neoplasm of retroperitoneum and peritoneum: Secondary | ICD-10-CM | POA: Diagnosis not present

## 2022-08-12 DIAGNOSIS — D701 Agranulocytosis secondary to cancer chemotherapy: Secondary | ICD-10-CM | POA: Diagnosis not present

## 2022-08-12 NOTE — Progress Notes (Signed)
DISCONTINUE OFF PATHWAY REGIMEN - Ovarian   [id number]:Carboplatin AUC=2 IV D1,8,15 + Paclitaxel 80 mg/m2 IV D1,8,15 q21 Days:   A cycle is every 21 days:     Paclitaxel      Carboplatin   **Always confirm dose/schedule in your pharmacy ordering system**  REASON: Disease Progression PRIOR TREATMENT: Off Pathway: Carboplatin AUC=2 IV D1,8,15 + Paclitaxel 80 mg/m2 IV D1,8,15 q21 Days TREATMENT RESPONSE: Partial Response (PR)  START ON PATHWAY REGIMEN - Ovarian     A cycle is every 21 days:     Carboplatin   **Always confirm dose/schedule in your pharmacy ordering system**  Patient Characteristics: Recurrent or Progressive Disease, Second Line, Platinum Sensitive and ? 6 Months Since Last Therapy, Not a Candidate for Secondary Debulking Surgery BRCA Mutation Status: Absent Therapeutic Status: Recurrent or Progressive Disease Line of Therapy: Second Line  Intent of Therapy: Non-Curative / Palliative Intent, Discussed with Patient

## 2022-08-12 NOTE — Progress Notes (Signed)
[place] OFFICE PROGRESS NOTE   Diagnosis: Ovarian cancer  INTERVAL HISTORY:   [name] [name] returns as scheduled.  She feels well.  Mild intermittent discomfort in the right lower abdomen.  She has intermittent diarrhea after eating.  Objective:  Vital signs in last 24 hours:  Blood pressure 128/71, pulse 89, temperature 98.1 F (36.7 C), temperature source Oral, resp. rate 18, height _0  (1.549 m), weight 152 lb 10.4 oz (69.2 kg), SpO2 98 %.    Resp: Lungs clear bilaterally Cardio: Regular rate and rhythm GI: No mass, nontender, no apparent ascites Vascular: No leg edema  Portacath/PICC-without erythema  Lab Results:  Lab Results  Component Value Date   WBC 5.0 03/25/2022   HGB 12.9 03/25/2022   HCT 39.9 03/25/2022   MCV 104.5 (H) 03/25/2022   PLT 193 03/25/2022   NEUTROABS 3.4 03/25/2022    CMP  Lab Results  Component Value Date   NA 140 08/10/2022   K 4.0 08/10/2022   CL 104 08/10/2022   CO2 28 08/10/2022   GLUCOSE 87 08/10/2022   BUN 26 (H) 08/10/2022   CREATININE 0.65 08/10/2022   CALCIUM 9.8 08/10/2022   PROT 6.5 03/25/2022   ALBUMIN 4.0 03/25/2022   AST 20 03/25/2022   ALT 19 03/25/2022   ALKPHOS 53 03/25/2022   BILITOT 0.6 03/25/2022   GFRNONAA >60 08/10/2022   GFRAA >60 03/13/2020    Lab Results  Component Value Date   CEA 0.8 07/13/2012   CA125 8 08/18/2016      Imaging:  CT Abdomen Pelvis W Contrast  Result Date: 08/11/2022 CLINICAL DATA:  Ovarian carcinoma. Assess treatment response. * Tracking Code: BO *. EXAM: CT ABDOMEN AND PELVIS WITH CONTRAST TECHNIQUE: Multidetector CT imaging of the abdomen and pelvis was performed using the standard protocol following bolus administration of intravenous contrast. RADIATION DOSE REDUCTION: This exam was performed according to the departmental dose-optimization program which includes automated exposure control, adjustment of the mA and/or kV according to patient size and/or use  of iterative reconstruction technique. CONTRAST:  128m OMNIPAQUE IOHEXOL 300 MG/ML  SOLN COMPARISON:  CT 03/23/2022 FINDINGS: Lower chest: Mild pleural nodularity in posterior RIGHT lower lobe (image 1/series 4. Hepatobiliary: No focal hepatic lesion. No biliary duct dilatation. Common bile duct is normal. Pancreas: Pancreas is normal. No ductal dilatation. No pancreatic inflammation. Spleen: Normal spleen Adrenals/urinary tract: Bilateral extrarenal pelves. Ureteral obstruction. No enhancing hepatic lesion. Stomach/Bowel: The stomach, duodenum, and small bowel normal. The colon and rectosigmoid colon are normal. Vascular/Lymphatic: Abdominal aorta is normal caliber. No periportal or retroperitoneal adenopathy. No pelvic adenopathy. Reproductive: Post hysterectomy Other: Peritoneal nodularity in the central and LEFT upper peritoneal space/lesser omentum again noted. Individual nodules measure larger in size. For example nodule measuring 12 mm (image 36/2) compares to 7 mm. Nodule in inferior to the distal stomach measuring 25 by 11 mm (32/2) compares to 18 x 10 mm. Nodule along the ventral peritoneal surface of the LEFT upper quadrant measuring 28 by 16 mm compares to 19 x 9 mm. Small amount of fluid along the inferior border of loops of small bowel in the upper abdomen (image 70/2. There is thin enhancing peritoneum along this fluid collection. Musculoskeletal: No aggressive osseous lesion. IMPRESSION: 1. Increase in size of individual metastatic peritoneal nodules in the lesser omentum. 2. Increase in small volume intraperitoneal free fluid in the upper pelvis with thin enhancing border. Findings concerning for malignant ascites. Electronically Signed   By: SHelane GuntherD.  On: 08/11/2022 16:33    Medications: I have reviewed the patient's current medications.   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 A 5 mm nodules remain on the right diaphragm. - TumorNext paired germline/tumor analyses: No somatic variants detected, germline CHEK2      VUS      Cycle 1 of adjuvant Taxol/carboplatin chemotherapy initiated on 09/19/2012.   The CA 125 normalized.   She completed day 15 of cycle 6 on 02/06/2013.   Restaging CT evaluation 03/29/2013 showed no evidence of metastatic disease in the chest. There was marked improvement in appearance/resolution of previous described peritoneal/omental disease. There was no convincing evidence of residual disease. There was minimal increased density in the region of the omentum favored to be treatment-related. There was no pelvic adenopathy. CA125 3.2 on 02/19/2014. 08/18/2016 CA-125 8 CT abdomen/pelvis 08/25/2016-solitary new enlarged right external iliac lymph node measuring 2.2 cm. Biopsy 09/07/2016-adenocarcinoma consistent with high-grade serous carcinoma. PET scan 09/22/2016-malignant range FDG uptake associated with the enlarged right external iliac lymph node compatible with metastatic adenopathy.  No additional sites of metastatic disease identified. Radiation 10/25/2016-12/01/2016 PET scan 03/28/2017-previously enlarged hypermetabolic right external iliac node-normal in size with resolution of hypermetabolic activity, no active malignancy identified PET scan 05/15/2018-no evidence of recurrent or metastatic disease, no hypermetabolic lymph nodes CT abdomen/pelvis 09/19/2019-mild increase in the size of several left upper quadrant peritoneal nodules measuring up to 11 mm.  No other sites of metastatic disease identified.  No ascites. CT abdomen/pelvis 03/14/2020-slight enlargement of several left upper quadrant peritoneal nodules, no ascites, no other evidence of disease progression CT abdomen/pelvis 09/12/2020 peritoneal nodularity/omental caking predominantly in the left upper/mid abdomen,  mildly progressive.  Largest implant in the left upper abdomen adjacent to the stomach now measures 17 mm, previously 13 mm.  Overall volume of peritoneal disease has mildly progressed. CT abdomen/pelvis 01/13/2021- 4 mm right ureteral calculus with moderate right hydronephrosis and upper right hydroureter, progressive omental nodularity, stable calcified and partially calcified right pelvic nodules CT abdomen/pelvis 05/13/2021-enlargement of left upper quadrant omental nodules and a peritoneal nodule at the right iliac fossa, no ascites CT abdomen/pelvis 08/14/2021-mild increase in size of omental nodules, no ascites, no new site of metastatic disease Taxol/carboplatin 09/09/2021, 09/15/2021, 09/22/2021 Taxol/carboplatin 10/07/2021, 10/13/2021, 10/20/2021 Taxol/Carboplatin 11/03/2021, 11/10/2021, 11/17/2021 CT 11/23/2021-decrease in peritoneal metastases, no new or progressive disease, airspace opacity at both lung bases Taxol/carboplatin 12/01/2021, 12/08/2021, 12/15/2021 Taxol/Carboplatin every 2 weeks beginning 12/29/2021 Carboplatin alone 01/26/2022, Taxol held due to neuropathy Carboplatin alone 02/09/2022, Taxol held due to neuropathy Carboplatin alone 02/23/2022, Taxol held due to neuropathy Carboplatin alone 03/09/2022, Taxol held due to neuropathy CTs 03/25/2022-no significant change in omental, mesenteric disease.  No new or clearly progressive findings.  Previously noted patchy areas of consolidative opacity at the lung bases have resolved. Treatment break beginning 03/26/2022 CT/pelvis 08/10/2022-increased size of peritoneal nodules, increased small volume ascites, mild right lower lobe pleural nodularity   2. Low abdomen/suprapubic pain prior to the exploratory laparotomy-likely secondary to omental/pelvic tumor; persistent mild pain in the lower abdomen   3. Chronic neck and back pain.   4. Anxiety -persistent despite Lexapro and Xanax. She has been evaluated by psychiatry. Currently on Lexapro. 5. Status  post Port-A-Cath placement 09/22/2012. The Port-A-Cath was removed on 04/03/2013.   6. Neutropenia secondary to chemotherapy- day 15 cycle 1 and cycle 3. Taxol/carboplatin not given secondary to neutropenia.  7. Herpes zoster involving a right thoracic dermatome July 2015. She completed a course of Valtrex. 8. Nodular bony prominence at the left pelvis on rectal exam 06/05/2015-likely a benign finding 9.  Right ureter stone/hydroureteronephrosis on CT 01/13/2021-referred to urology; status post right ureteroscopy with stone extraction and ureteral stent placement.  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  The CA125 is mildly elevated.  I reviewed the CT findings and images with her.  There is evidence of disease progression in the abdomen and pelvis.  It is unclear whether her diarrhea is related to the carcinomatosis.  We discussed treatment options including continued observation versus resuming systemic therapy.  She last received carboplatin in June.  The carboplatin was discontinued for a treatment break.  She did not have progressive disease while on carboplatin.  I recommend resuming carboplatin.  [name] [name] plans a trip to [place] for Thanksgiving.  She will return during the week of 09/06/2022.  She will be scheduled to resume carboplatin on 09/14/2022.  I reviewed potential toxicities associated with carboplatin including the chance of nausea, alopecia, and an allergic reaction.  She agrees to proceed. A chemotherapy plan was entered today.  [name], MD  08/12/2022  8:59 AM

## 2022-08-13 ENCOUNTER — Other Ambulatory Visit: Payer: Self-pay

## 2022-08-17 ENCOUNTER — Other Ambulatory Visit: Payer: Self-pay

## 2022-08-19 ENCOUNTER — Telehealth: Payer: Self-pay | Admitting: *Deleted

## 2022-08-19 NOTE — Telephone Encounter (Signed)
Patient called and stated "I had seen Dr [name] [name] a while ago and now see Dr [name] [name]. I'm still having chemo. Do I need to come see her?" Per Dr [name] [name] last office note to be seen after finishing treatment. Explained to the patient that she doesn't need to be seen until after treatment unless she is having pain or bleeding. Patient verbalized understanding

## 2022-08-19 NOTE — Telephone Encounter (Signed)
I haven't seen her in a year. I'd like to see her for follow-up in December. Thank you!

## 2022-08-20 ENCOUNTER — Telehealth: Payer: Self-pay | Admitting: *Deleted

## 2022-08-20 NOTE — Telephone Encounter (Signed)
Per Dr Berline Lopes patient scheduled for a follow up on 12/1

## 2022-08-20 NOTE — Telephone Encounter (Signed)
Pt scheduled  

## 2022-08-23 ENCOUNTER — Other Ambulatory Visit: Payer: Self-pay

## 2022-09-02 ENCOUNTER — Encounter: Payer: Self-pay | Admitting: Gynecologic Oncology

## 2022-09-03 ENCOUNTER — Inpatient Hospital Stay: Payer: Medicare Other | Attending: Oncology | Admitting: Gynecologic Oncology

## 2022-09-03 ENCOUNTER — Inpatient Hospital Stay: Payer: Medicare Other

## 2022-09-03 ENCOUNTER — Encounter: Payer: Self-pay | Admitting: Gynecologic Oncology

## 2022-09-03 VITALS — BP 120/76 | HR 93 | Temp 97.9°F | Resp 16 | Ht 61.0 in | Wt 151.2 lb

## 2022-09-03 DIAGNOSIS — R300 Dysuria: Secondary | ICD-10-CM

## 2022-09-03 DIAGNOSIS — R152 Fecal urgency: Secondary | ICD-10-CM

## 2022-09-03 DIAGNOSIS — R8290 Unspecified abnormal findings in urine: Secondary | ICD-10-CM

## 2022-09-03 DIAGNOSIS — Z923 Personal history of irradiation: Secondary | ICD-10-CM | POA: Diagnosis not present

## 2022-09-03 DIAGNOSIS — R110 Nausea: Secondary | ICD-10-CM | POA: Diagnosis not present

## 2022-09-03 DIAGNOSIS — G893 Neoplasm related pain (acute) (chronic): Secondary | ICD-10-CM | POA: Diagnosis not present

## 2022-09-03 DIAGNOSIS — C569 Malignant neoplasm of unspecified ovary: Secondary | ICD-10-CM

## 2022-09-03 DIAGNOSIS — R197 Diarrhea, unspecified: Secondary | ICD-10-CM | POA: Diagnosis not present

## 2022-09-03 DIAGNOSIS — F419 Anxiety disorder, unspecified: Secondary | ICD-10-CM | POA: Insufficient documentation

## 2022-09-03 DIAGNOSIS — R918 Other nonspecific abnormal finding of lung field: Secondary | ICD-10-CM | POA: Insufficient documentation

## 2022-09-03 DIAGNOSIS — M542 Cervicalgia: Secondary | ICD-10-CM | POA: Insufficient documentation

## 2022-09-03 DIAGNOSIS — Z5111 Encounter for antineoplastic chemotherapy: Secondary | ICD-10-CM | POA: Diagnosis not present

## 2022-09-03 DIAGNOSIS — Z8543 Personal history of malignant neoplasm of ovary: Secondary | ICD-10-CM

## 2022-09-03 DIAGNOSIS — Z90722 Acquired absence of ovaries, bilateral: Secondary | ICD-10-CM | POA: Diagnosis not present

## 2022-09-03 DIAGNOSIS — C786 Secondary malignant neoplasm of retroperitoneum and peritoneum: Secondary | ICD-10-CM

## 2022-09-03 DIAGNOSIS — M549 Dorsalgia, unspecified: Secondary | ICD-10-CM | POA: Diagnosis not present

## 2022-09-03 DIAGNOSIS — Z90710 Acquired absence of both cervix and uterus: Secondary | ICD-10-CM | POA: Diagnosis not present

## 2022-09-03 DIAGNOSIS — R180 Malignant ascites: Secondary | ICD-10-CM | POA: Diagnosis not present

## 2022-09-03 LAB — URINALYSIS, COMPLETE (UACMP) WITH MICROSCOPIC
Bilirubin Urine: NEGATIVE
Glucose, UA: NEGATIVE mg/dL
Hgb urine dipstick: NEGATIVE
Ketones, ur: NEGATIVE mg/dL
Nitrite: NEGATIVE
Protein, ur: NEGATIVE mg/dL
Specific Gravity, Urine: 1.023 (ref 1.005–1.030)
pH: 5 (ref 5.0–8.0)

## 2022-09-03 NOTE — Patient Instructions (Addendum)
It was good to see you today.  Please think about the option of a clinical trial.  I will call you on Monday to discuss further.  I will see you for follow-up in 4 months.  As always, if you develop any new and concerning symptoms before your next visit, please call to see me sooner.

## 2022-09-03 NOTE — Progress Notes (Signed)
Gynecologic Oncology Return Clinic Visit  09/03/22  Reason for Visit: Follow-up in the setting of ovarian cancer recurrence  Treatment History: Oncology History Overview Note  CA-125 10/10/113: 68 02/2013: 5.6, post adjuvant chemotherapy (03/2013-02/2016): 3.2-8.8 08/2016: 10.2, just prior to recurrence diagnosis 02/14/17: 7.1, 3 months post completion of RT 09/17/19: 6.5  In 2014, presented with pelvic pain and pressure, urinary frequency.  In 08/2016, presented with 3 months of constipation and RLQ pain. On exam, had some fullness and tenderness along right pelvic sidewall.    Ovarian cancer ([place])  07/12/2012 Imaging   PET: significant soft tissue thickening seen throughout the greater omentum suspicious for intraperitoneal carcinoma. There is minimal ascites in the pelvis. it revealed the areas of omental and peritoneal nodularity were hypermetabolic. There is a dominant pelvic omental mass measuring 10.4 x 3.3 cm. There is a small omental implants in the left side of the abdomen measuring 1.1 cm. There were multiple pelvic foci That wereperitoneal based And hypermetabolic. These included the cul-de-sac. She has small volume of ascites.    07/24/2012 Initial Biopsy   CT-guided revealed metastatic carcinoma. By [facility], malignant cells were positive for WT 1, cytokeratin 7, estrogen receptor. There were negative for TTF-1, progesterone receptor, cytokeratin 20, CEA, CD X2, and S100. Overall, the histologic features were highly suggestive of a primary gynecologic origin.   08/22/2012 Surgery   Radical debulking - TAH/BSO, ureterolysis, omentectomy, stripping of pelvic peritoneum, omentectomy, and rectosigmoid resection with low rectal reanastomosis Findings: stage IIIC ovarian cancer. Patient was optimally debulked. Debulking, however, required a rectosigmoid resection as well as total omentectomy total nominal hysterectomy bilateral salpingo-oophorectomy. She was optimally debulked with only 5  mm nodules on the right diaphragm as residual disease.    08/23/2012 Initial Diagnosis   Ovarian cancer ([place])   09/2012 - 02/06/2013 Chemotherapy   6 cycles of carboplatin/taxol    03/29/2013 Imaging   CT C/A/P: 1.  Marked response to therapy of omental/peritoneal disease.  No residual measurable disease identified. 2.  Interval hysterectomy and bilateral oophrectomy.   08/25/2016 Imaging   CT A/P: 1. Solitary new enlarged right external iliac lymph node, suspicious for metastatic nodal recurrence. 2. No additional sites of metastatic disease in the abdomen or pelvis. 3. Aortic atherosclerosis.   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 There is malignant range FDG uptake associated with the enlarged right external iliac lymph node compatible with metastatic adenopathy. 2. No additional sites of metastatic disease identified. 3. Aortic atherosclerosis.   10/25/2016 - 12/01/2016 Radiation Therapy   Radiation to right pelvis treated to 60.2Gy in 28 fractions   03/28/2017 Imaging   PET post RT: 1. The previously enlarged and hypermetabolic right external iliac lymph node is currently of normal size and the previous hypermetabolic activity has resolved. No current active malignancy identified. 2. 2 mm right mid kidney nonobstructive renal calculus. 3.  Aortic Atherosclerosis (ICD10-I70.0).   09/23/2017 Imaging   CT A/P: No evidence of bowel obstruction.  Normal appendix. No CT findings to account for the patient's worsening right lower quadrant abdominal pain. No evidence of recurrent or metastatic disease.   10/31/2017 Imaging   PET: No evidence local ovarian carcinoma recurrence in the pelvis. No metastatic adenopathy or distant metastatic disease.   05/15/2018 Imaging   PET: 1. No evidence recurrent or  metastatic disease. 2.  Aortic atherosclerosis (ICD10-170.0).   09/19/2019 Imaging   CT A/P: Mild increase in size of several left upper quadrant peritoneal nodules measuring up to 11 mm, highly suspicious for increased peritoneal carcinomatosis. No other sites of metastatic disease identified. No evidence of ascites.   11/27/2019 Genetic Testing   CHEK2 p.N405K VUS found on the cancerNext HRD testing.  The CancerNext gene panel offered by [employer] includes sequencing and rearrangement analysis for the following 34 genes:   APC, ATM, BARD1, BMPR1A, BRCA1, BRCA2, BRIP1, CDH1, CDK4, CDKN2A, CHEK2, DICER1, HOXB13, EPCAM, GREM1, MLH1, MRE11A, MSH2, MSH6, MUTYH, NBN, NF1, PALB2, PMS2, POLD1, POLE, PTEN, RAD50, RAD51C, RAD51D, SMAD4, SMARCA4, STK11, and TP53.  The report date is November 27, 2019.  HRD testing did not identify any pathogenic variants in the tumor.  Therefore HRD is neg.   09/09/2021 - 03/09/2022 Chemotherapy   Patient is on Treatment Plan : OVARIAN Paclitaxel / Carboplatin q7d     09/14/2022 -  Chemotherapy   Patient is on Treatment Plan : BREAST Paclitaxel + Carboplatin q7d       Interval History: Patient reports plan to restart chemotherapy in the next couple of weeks.  This is after having a treatment break starting in June.  At that time, omental and mesenteric disease showed no significant interval change and there was no new findings on scan.  She endorses continued occasional right-sided pain, at baseline.  Denies any new abdominal or pelvic pain.  Has noticed intermittent foul smell to her urine as well as change in the appearance of her urine.  Also endorses some dysuria.  She is having intermittent diarrhea.  When she eats, she often has to have a bowel movement within 30 minutes of eating.  Endorses a good appetite, denies any nausea or emesis.  Past Medical/Surgical History: Past Medical History:  Diagnosis Date   Anxiety and depression     DJD (degenerative  joint disease)    Elevated cholesterol    Endometrial polyp    Family history of bladder cancer    Family history of lymphoma    History of radiation therapy 10/25/16-12/01/16   right pelvis/60.2 Gy in 28 fractions   IRRITABLE BOWEL SYNDROME, HX OF 05/15/2008   Kidney stone    MVP (mitral valve prolapse)    occ palpitations    Ovarian ca ([place]) 07/2012       Rheumatoid arthritis(714.0) ~ 2010   dr Ouida Sills   Shingles     Past Surgical History:  Procedure Laterality Date   ABDOMINAL HYSTERECTOMY  08/22/2012   Procedure: HYSTERECTOMY ABDOMINAL;  Surgeon: Alvino Chapel, MD;  Location: [facility];  Service: Gynecology;  Laterality: N/A;   BACK SURGERY  10/04/2006   Dr Trenton Gammon   COLONOSCOPY  04/20/2004   COLONOSCOPY W/ BIOPSIES  07/04/2014   COLOSTOMY REVISION  08/22/2012   Procedure: COLON RESECTION SIGMOID;  Surgeon: Alvino Chapel, MD;  Location: [facility];  Service: Gynecology;;  Rectal Sigmoid resection and low rectal anastomosis   CYST ON NECK  10/04/2009   DEBULKING  08/22/2012   Procedure: DEBULKING;  Surgeon: Alvino Chapel, MD;  Location: [facility];  Service: Gynecology;  Laterality: N/A;  Radical tumor debulking, Bilateral Ureterolysis   DILATION AND CURETTAGE OF UTERUS  10/04/2002   WITH HYSTEROSCOPY   HAND SURGERY  10/04/1994   HYSTEROSCOPY  10/04/2002   D&C   IR IMAGING GUIDED PORT INSERTION  09/01/2021   KIDNEY STONE SURGERY Right    with stent placement  Mar 03 2021   NECK SURGERY  10/05/2007   SPURS   OMENTECTOMY  08/22/2012   Procedure: OMENTECTOMY;  Surgeon: Alvino Chapel, MD;  Location: [facility];  Service: Gynecology;  Laterality: N/A;   SALPINGOOPHORECTOMY  08/22/2012   Procedure: SALPINGO OOPHORECTOMY;  Surgeon: Alvino Chapel, MD;  Location: [facility];  Service: Gynecology;  Laterality: Bilateral;   TUBAL LIGATION  10/04/1978    Family History  Problem Relation Age of Onset   Heart disease Mother    Bladder Cancer  Father 23   Osteoporosis Sister    Lymphoma Paternal Aunt    Colon cancer Neg Hx    Rectal cancer Neg Hx    Stomach cancer Neg Hx    Diabetes Neg Hx    Stroke Neg Hx    Colon polyps Neg Hx     Social History   Socioeconomic History   Marital status: Married    Spouse name: Not on file   Number of children: 2   Years of education: Not on file   Highest education level: Not on file  Occupational History   Occupation: disability d/t RA  Tobacco Use   Smoking status: Never   Smokeless tobacco: Never  Vaping Use   Vaping Use: Never used  Substance and Sexual Activity   Alcohol use: No   Drug use: No   Sexual activity: Never    Birth control/protection: Post-menopausal, Surgical  Other Topics Concern   Not on file  Social History Narrative   [name] is her daughter    Household-- pr and husband   Social Determinants of Health   Financial Resource Strain: Low Risk  (11/17/2021)   Overall Financial Resource Strain (CARDIA)    Difficulty of Paying Living Expenses: Not hard at all  Food Insecurity: No Food Insecurity (11/17/2021)   Hunger Vital Sign    Worried About Running Out of Food in the Last Year: Never true    Ran Out of Food in the Last Year: Never true  Transportation Needs: No Transportation Needs (11/17/2021)   PRAPARE - Hydrologist (Medical): No    Lack of Transportation (Non-Medical): No  Physical Activity: Insufficiently Active (11/17/2021)   Exercise Vital Sign    Days of Exercise per Week: 1 day    Minutes of Exercise per Session: 30 min  Stress: Stress Concern Present (11/17/2021)   [place]    Feeling of Stress : To some extent  Social Connections: Moderately Isolated (11/17/2021)   Social Connection and Isolation Panel [NHANES]    Frequency of Communication with Friends and Family: Once a week    Frequency of Social Gatherings with Friends and  Family: Once a week    Attends Religious Services: Never    Marine scientist or Organizations: Yes    Attends Archivist Meetings: Never    Marital Status: Married    Current Medications:  Current Outpatient Medications:    fish oil-omega-3 fatty acids 1000 MG capsule, Take 1 g by mouth daily., Disp: , Rfl:    lidocaine-prilocaine (EMLA) cream, Apply 1 application topically as directed. Apply 1/2 tablespoon to port site and cover with Press-and-Seal 2 hours prior to IV stick to numb site. Begin use 14 days after port is inserted., Disp: 30 g, Rfl: 1   Multiple Vitamin (MULTIVITAMIN WITH MINERALS) TABS, Take 1 tablet by mouth daily., Disp: , Rfl:  Red Yeast Rice Extract 600 MG CAPS, Take 1 capsule by mouth daily., Disp: , Rfl:   Current Facility-Administered Medications:    0.9 %  sodium chloride infusion, 500 mL, Intravenous, Continuous, [name], MD  Review of Systems: Denies appetite changes, fevers, chills, fatigue, unexplained weight changes. Denies hearing loss, neck lumps or masses, mouth sores, ringing in ears or voice changes. Denies cough or wheezing.  Denies shortness of breath. Denies chest pain or palpitations. Denies leg swelling. Denies abdominal distention, pain, blood in stools, constipation, diarrhea, nausea, vomiting, or early satiety. Denies pain with intercourse, dysuria, frequency, hematuria or incontinence. Denies hot flashes, pelvic pain, vaginal bleeding or vaginal discharge.   Denies joint pain, back pain or muscle pain/cramps. Denies itching, rash, or wounds. Denies dizziness, headaches, numbness or seizures. Denies swollen lymph nodes or glands, denies easy bruising or bleeding. Denies anxiety, depression, confusion, or decreased concentration.  Physical Exam: BP 120/76   Pulse 93   Temp 97.9 F (36.6 C) (Oral)   Resp 16   Ht _0  (1.549 m)   Wt 151 lb 4 oz (68.6 kg)   SpO2 100%   BMI 28.58 kg/m  General: Alert,  oriented, no acute distress. HEENT: Normocephalic, atraumatic, sclera anicteric. Chest: Clear to auscultation bilaterally.  No wheezes or rhonchi. Cardiovascular: Regular rate and rhythm, no murmurs. Abdomen: soft, nontender.  Normoactive bowel sounds.  No masses or hepatosplenomegaly appreciated.   Extremities: Grossly normal range of motion.  Warm, well perfused.  No edema bilaterally. Skin: No rashes or lesions noted. Lymphatics: No cervical, supraclavicular, or inguinal adenopathy. GU: Normal appearing external genitalia without erythema, excoriation, or lesions.  Bimanual exam reveals cuff intact, no nodularity or masses palpated.  Rectovaginal exam confirms findings.  Laboratory & Radiologic Studies: Component Ref Range & Units 3 wk ago (08/10/22) 1 mo ago (07/06/22) 3 mo ago (05/25/22) 5 mo ago (03/25/22) 5 mo ago (03/09/22) 6 mo ago (02/09/22) 7 mo ago (01/12/22)  Cancer Antigen (CA) 125 0.0 - 38.1 U/mL 41.2 High  24.1 CM 14.3 CM 11.2 CM 11.4 CM 14.1 CM 14.6 CM   CT A/P on 11/7: IMPRESSION: 1. Increase in size of individual metastatic peritoneal nodules in the lesser omentum. 2. Increase in small volume intraperitoneal free fluid in the upper pelvis with thin enhancing border. Findings concerning for malignant ascites.  Assessment & Plan: [name] [name] is a 71 y.o. woman with recurrent platinum sensitive ovarian cancer.  Had partial response during last treatment, this was stopped in June for treatment break.  Now with increasing CA125 and progression of disease on imaging.  Patient is overall doing well.  She remains relatively asymptomatic with regard to her cancer, which has had a very indolent course.  We discussed trying a bulking agent to see if this helps with her diarrhea.  Given her urinary symptoms, we will send a urinalysis and culture today.  Plan is for her to start single agent carboplatin in the setting of her platinum sensitive disease.  She and I talked about the  possibility of clinical trials.  At this time, she is wanting to pursue single agent chemotherapy but is open to talking more about clinical trials.  I will call her on Monday to discuss this further.  We have 2 trials for platinum sensitive ovarian cancer patients open at [facility].  If the patient is interested, I can send her information to our study coordinators to screen her for eligibility.  The patient was interested in speaking  with one of our cancer center nutritionist.  A referral for nutrition was placed.  22 minutes of total time was spent for this patient encounter, including preparation, face-to-face counseling with the patient and coordination of care, and documentation of the encounter.  Jeral Pinch, MD  Division of Gynecologic Oncology  Department of Obstetrics and Gynecology  [facility]

## 2022-09-04 ENCOUNTER — Other Ambulatory Visit: Payer: Self-pay

## 2022-09-04 LAB — URINE CULTURE: Culture: <10000 — AB

## 2022-09-08 ENCOUNTER — Other Ambulatory Visit: Payer: Self-pay

## 2022-09-08 ENCOUNTER — Inpatient Hospital Stay: Payer: Medicare Other | Admitting: Dietician

## 2022-09-08 NOTE — Progress Notes (Signed)
Nutrition Assessment   Reason for Assessment: Pt request   ASSESSMENT: 71 year old female with recurrence of ovarian cancer. She is pending start of second line carboplatin q21d (first planned 12/12). Patient is under the care of Dr. Benay Spice.   Met with patient in office. Patient reports this is her third recurrence of cancer. She has questions on what she should and should not be eating due to conflicting information she has read on the internet. Patient endorses overall healthy diet. She avoids processed foods. Occasionally has a hotdog or fries up some spam. Patient is active. She enjoys getting outside and digging in her garden. Patient is drinking 2-2 1/2 bottles of HD water. Someone told her this was better water to drink with cancer.    Medications: MVI, fish oil   Labs: 11/9 labs reviewed   Anthropometrics:   Height: 5'1" Weight: 151 lb 4 oz  UBW: 150 lb (per pt) BMI: 28.58   NUTRITION DIAGNOSIS: Food and nutrition related knowledge deficit related to cancer and associated treatment as evidenced by pt request for foods to avoid during treatment    INTERVENTION:  Continue eating regular diet as tolerated during treatment Educated on sugar intake and cancer - handout provided Recommend protein source with all meals and snacks - list of foods provided  Continue activity as able   MONITORING, EVALUATION, GOAL: Patient will tolerate adequate calories and protein to minimize weight loss during treatment   Next Visit: No follow-up scheduled. Patient encouraged to contact with nutrition questions/concerns

## 2022-09-12 ENCOUNTER — Other Ambulatory Visit: Payer: Self-pay | Admitting: Oncology

## 2022-09-14 ENCOUNTER — Inpatient Hospital Stay: Payer: Medicare Other

## 2022-09-14 ENCOUNTER — Encounter: Payer: Self-pay | Admitting: *Deleted

## 2022-09-14 ENCOUNTER — Inpatient Hospital Stay: Payer: Medicare Other | Admitting: Oncology

## 2022-09-14 VITALS — BP 129/62 | HR 82 | Temp 98.1°F | Resp 18 | Ht 61.0 in | Wt 155.2 lb

## 2022-09-14 DIAGNOSIS — G893 Neoplasm related pain (acute) (chronic): Secondary | ICD-10-CM | POA: Diagnosis not present

## 2022-09-14 DIAGNOSIS — C569 Malignant neoplasm of unspecified ovary: Secondary | ICD-10-CM

## 2022-09-14 DIAGNOSIS — Z90710 Acquired absence of both cervix and uterus: Secondary | ICD-10-CM | POA: Diagnosis not present

## 2022-09-14 DIAGNOSIS — Z90722 Acquired absence of ovaries, bilateral: Secondary | ICD-10-CM | POA: Diagnosis not present

## 2022-09-14 DIAGNOSIS — Z5111 Encounter for antineoplastic chemotherapy: Secondary | ICD-10-CM | POA: Diagnosis not present

## 2022-09-14 DIAGNOSIS — Z8543 Personal history of malignant neoplasm of ovary: Secondary | ICD-10-CM | POA: Diagnosis not present

## 2022-09-14 DIAGNOSIS — M549 Dorsalgia, unspecified: Secondary | ICD-10-CM | POA: Diagnosis not present

## 2022-09-14 DIAGNOSIS — M542 Cervicalgia: Secondary | ICD-10-CM | POA: Diagnosis not present

## 2022-09-14 DIAGNOSIS — Z923 Personal history of irradiation: Secondary | ICD-10-CM | POA: Diagnosis not present

## 2022-09-14 DIAGNOSIS — R180 Malignant ascites: Secondary | ICD-10-CM | POA: Diagnosis not present

## 2022-09-14 DIAGNOSIS — F419 Anxiety disorder, unspecified: Secondary | ICD-10-CM | POA: Diagnosis not present

## 2022-09-14 DIAGNOSIS — R110 Nausea: Secondary | ICD-10-CM | POA: Diagnosis not present

## 2022-09-14 DIAGNOSIS — C786 Secondary malignant neoplasm of retroperitoneum and peritoneum: Secondary | ICD-10-CM | POA: Diagnosis not present

## 2022-09-14 DIAGNOSIS — R918 Other nonspecific abnormal finding of lung field: Secondary | ICD-10-CM | POA: Diagnosis not present

## 2022-09-14 DIAGNOSIS — R300 Dysuria: Secondary | ICD-10-CM | POA: Diagnosis not present

## 2022-09-14 DIAGNOSIS — R197 Diarrhea, unspecified: Secondary | ICD-10-CM | POA: Diagnosis not present

## 2022-09-14 LAB — CBC WITH DIFFERENTIAL (CANCER CENTER ONLY)
Abs Immature Granulocytes: 0.02 10*3/uL (ref 0.00–0.07)
Basophils Absolute: 0 10*3/uL (ref 0.0–0.1)
Basophils Relative: 1 %
Eosinophils Absolute: 0.1 10*3/uL (ref 0.0–0.5)
Eosinophils Relative: 3 %
HCT: 42.5 % (ref 36.0–46.0)
Hemoglobin: 13.7 g/dL (ref 12.0–15.0)
Immature Granulocytes: 0 %
Lymphocytes Relative: 14 %
Lymphs Abs: 0.7 10*3/uL (ref 0.7–4.0)
MCH: 32.7 pg (ref 26.0–34.0)
MCHC: 32.2 g/dL (ref 30.0–36.0)
MCV: 101.4 fL — ABNORMAL HIGH (ref 80.0–100.0)
Monocytes Absolute: 0.6 10*3/uL (ref 0.1–1.0)
Monocytes Relative: 12 %
Neutro Abs: 3.7 10*3/uL (ref 1.7–7.7)
Neutrophils Relative %: 70 %
Platelet Count: 276 10*3/uL (ref 150–400)
RBC: 4.19 MIL/uL (ref 3.87–5.11)
RDW: 13.1 % (ref 11.5–15.5)
WBC Count: 5.2 10*3/uL (ref 4.0–10.5)
nRBC: 0 % (ref 0.0–0.2)

## 2022-09-14 LAB — CMP (CANCER CENTER ONLY)
ALT: 21 U/L (ref 0–44)
AST: 20 U/L (ref 15–41)
Albumin: 4.3 g/dL (ref 3.5–5.0)
Alkaline Phosphatase: 57 U/L (ref 38–126)
Anion gap: 11 (ref 5–15)
BUN: 21 mg/dL (ref 8–23)
CO2: 24 mmol/L (ref 22–32)
Calcium: 9.2 mg/dL (ref 8.9–10.3)
Chloride: 105 mmol/L (ref 98–111)
Creatinine: 0.72 mg/dL (ref 0.44–1.00)
GFR, Estimated: >60 mL/min (ref >60)
Glucose, Bld: 117 mg/dL — ABNORMAL HIGH (ref 70–99)
Potassium: 3.8 mmol/L (ref 3.5–5.1)
Sodium: 140 mmol/L (ref 135–145)
Total Bilirubin: 0.4 mg/dL (ref 0.3–1.2)
Total Protein: 6.8 g/dL (ref 6.5–8.1)

## 2022-09-14 MED ORDER — LIDOCAINE-PRILOCAINE 2.5-2.5 % EX CREA: 1.0000 | TOPICAL_CREAM | CUTANEOUS | 1 refills | Status: DC

## 2022-09-14 MED ORDER — SODIUM CHLORIDE 0.9 % IV SOLN: Freq: Once | INTRAVENOUS | Status: AC

## 2022-09-14 MED ORDER — ONDANSETRON HCL 8 MG PO TABS: 8.0000 mg | ORAL_TABLET | Freq: Three times a day (TID) | ORAL | 1 refills | Status: DC | PRN

## 2022-09-14 MED ORDER — HEPARIN SOD (PORK) LOCK FLUSH 100 UNIT/ML IV SOLN
500.0000 [IU] | Freq: Once | INTRAVENOUS | Status: AC | PRN
  Administered 2022-09-14: 500 [IU]

## 2022-09-14 MED ORDER — SODIUM CHLORIDE 0.9% FLUSH
10.0000 mL | INTRAVENOUS | Status: DC | PRN
  Administered 2022-09-14: 10 mL

## 2022-09-14 MED ORDER — FAMOTIDINE IN NACL 20-0.9 MG/50ML-% IV SOLN
20.0000 mg | Freq: Once | INTRAVENOUS | Status: AC
  Administered 2022-09-14: 20 mg via INTRAVENOUS
  Filled 2022-09-14: qty 50

## 2022-09-14 MED ORDER — SODIUM CHLORIDE 0.9 % IV SOLN
161.2000 mg | Freq: Once | INTRAVENOUS | Status: AC
  Administered 2022-09-14: 160 mg via INTRAVENOUS
  Filled 2022-09-14: qty 16

## 2022-09-14 MED ORDER — DIPHENHYDRAMINE HCL 50 MG/ML IJ SOLN
25.0000 mg | Freq: Once | INTRAMUSCULAR | Status: AC
  Administered 2022-09-14: 25 mg via INTRAVENOUS
  Filled 2022-09-14: qty 1

## 2022-09-14 MED ORDER — PROCHLORPERAZINE MALEATE 10 MG PO TABS: 10.0000 mg | ORAL_TABLET | Freq: Four times a day (QID) | ORAL | 1 refills | Status: DC | PRN

## 2022-09-14 MED ORDER — PALONOSETRON HCL INJECTION 0.25 MG/5ML
0.2500 mg | Freq: Once | INTRAVENOUS | Status: AC
  Administered 2022-09-14: 0.25 mg via INTRAVENOUS
  Filled 2022-09-14: qty 5

## 2022-09-14 MED ORDER — SODIUM CHLORIDE 0.9 % IV SOLN
10.0000 mg | Freq: Once | INTRAVENOUS | Status: AC
  Administered 2022-09-14: 10 mg via INTRAVENOUS
  Filled 2022-09-14: qty 10

## 2022-09-14 NOTE — Progress Notes (Signed)
[place] OFFICE PROGRESS NOTE   Diagnosis: Ovarian cancer  INTERVAL HISTORY:   Ms. Cavey as scheduled.  She generally feels well.  She has returned from a trip to [place].  She has intermittent diarrhea after eating.  No nausea.  She has mild intermittent pain in the right lower abdomen.  Objective:  Vital signs in last 24 hours:  Blood pressure 129/62, pulse 82, temperature 98.1 F (36.7 C), temperature source Oral, resp. rate 18, height _0  (1.549 m), weight 155 lb 3.2 oz (70.4 kg), SpO2 98 %.     Resp: Lungs clear bilaterally Cardio: Regular rate and rhythm GI: No hepatosplenomegaly, no apparent ascites, no mass, mild tenderness with deep palpation in the right lower abdomen Vascular: No leg edema   Portacath/PICC-without erythema  Lab Results:  Lab Results  Component Value Date   WBC 5.2 09/14/2022   HGB 13.7 09/14/2022   HCT 42.5 09/14/2022   MCV 101.4 (H) 09/14/2022   PLT 276 09/14/2022   NEUTROABS 3.7 09/14/2022    CMP  Lab Results  Component Value Date   NA 140 08/10/2022   K 4.0 08/10/2022   CL 104 08/10/2022   CO2 28 08/10/2022   GLUCOSE 87 08/10/2022   BUN 26 (H) 08/10/2022   CREATININE 0.65 08/10/2022   CALCIUM 9.8 08/10/2022   PROT 6.5 03/25/2022   ALBUMIN 4.0 03/25/2022   AST 20 03/25/2022   ALT 19 03/25/2022   ALKPHOS 53 03/25/2022   BILITOT 0.6 03/25/2022   GFRNONAA >60 08/10/2022   GFRAA >60 03/13/2020    Lab Results  Component Value Date   CEA 0.8 07/13/2012   CA125 8 08/18/2016     Medications: I have reviewed the patient's current medications.   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 A 5 mm nodules remain on the right diaphragm. - TumorNext paired germline/tumor analyses: No somatic variants detected, germline CHEK2      VUS       Cycle 1 of adjuvant Taxol/carboplatin chemotherapy initiated on 09/19/2012.   The CA 125 normalized.   She completed day 15 of cycle 6 on 02/06/2013.   Restaging CT evaluation 03/29/2013 showed no evidence of metastatic disease in the chest. There was marked improvement in appearance/resolution of previous described peritoneal/omental disease. There was no convincing evidence of residual disease. There was minimal increased density in the region of the omentum favored to be treatment-related. There was no pelvic adenopathy. CA125 3.2 on 02/19/2014. 08/18/2016 CA-125 8 CT abdomen/pelvis 08/25/2016-solitary new enlarged right external iliac lymph node measuring 2.2 cm. Biopsy 09/07/2016-adenocarcinoma consistent with high-grade serous carcinoma. PET scan 09/22/2016-malignant range FDG uptake associated with the enlarged right external iliac lymph node compatible with metastatic adenopathy.  No additional sites of metastatic disease identified. Radiation 10/25/2016-12/01/2016 PET scan 03/28/2017-previously enlarged hypermetabolic right external iliac node-normal in size with resolution of hypermetabolic activity, no active malignancy identified PET scan 05/15/2018-no evidence of recurrent or metastatic disease, no hypermetabolic lymph nodes CT abdomen/pelvis 09/19/2019-mild increase in the size of several left upper quadrant peritoneal nodules measuring up to 11 mm.  No other sites of metastatic disease identified.  No ascites. CT abdomen/pelvis 03/14/2020-slight enlargement of several left upper quadrant peritoneal nodules, no ascites, no other evidence of disease progression CT abdomen/pelvis 09/12/2020 peritoneal nodularity/omental caking predominantly in the left upper/mid abdomen, mildly progressive.  Largest implant in the left upper abdomen adjacent to  the stomach now measures 17 mm, previously 13 mm.  Overall volume of peritoneal disease has mildly progressed. CT abdomen/pelvis 01/13/2021- 4 mm right  ureteral calculus with moderate right hydronephrosis and upper right hydroureter, progressive omental nodularity, stable calcified and partially calcified right pelvic nodules CT abdomen/pelvis 05/13/2021-enlargement of left upper quadrant omental nodules and a peritoneal nodule at the right iliac fossa, no ascites CT abdomen/pelvis 08/14/2021-mild increase in size of omental nodules, no ascites, no new site of metastatic disease Taxol/carboplatin 09/09/2021, 09/15/2021, 09/22/2021 Taxol/carboplatin 10/07/2021, 10/13/2021, 10/20/2021 Taxol/Carboplatin 11/03/2021, 11/10/2021, 11/17/2021 CT 11/23/2021-decrease in peritoneal metastases, no new or progressive disease, airspace opacity at both lung bases Taxol/carboplatin 12/01/2021, 12/08/2021, 12/15/2021 Taxol/Carboplatin every 2 weeks beginning 12/29/2021 Carboplatin alone 01/26/2022, Taxol held due to neuropathy Carboplatin alone 02/09/2022, Taxol held due to neuropathy Carboplatin alone 02/23/2022, Taxol held due to neuropathy Carboplatin alone 03/09/2022, Taxol held due to neuropathy CTs 03/25/2022-no significant change in omental, mesenteric disease.  No new or clearly progressive findings.  Previously noted patchy areas of consolidative opacity at the lung bases have resolved. Treatment break beginning 03/26/2022 CT/pelvis 08/10/2022-increased size of peritoneal nodules, increased small volume ascites, mild right lower lobe pleural nodularity Cycle 1 salvage carboplatin 09/14/2022   2. Low abdomen/suprapubic pain prior to the exploratory laparotomy-likely secondary to omental/pelvic tumor; persistent mild pain in the lower abdomen   3. Chronic neck and back pain.   4. Anxiety -persistent despite Lexapro and Xanax. She has been evaluated by psychiatry. Currently on Lexapro. 5. Status post Port-A-Cath placement 09/22/2012. The Port-A-Cath was removed on 04/03/2013.   6. Neutropenia secondary to chemotherapy- day 15 cycle 1 and cycle 3. Taxol/carboplatin not given  secondary to neutropenia.    7. Herpes zoster involving a right thoracic dermatome July 2015. She completed a course of Valtrex. 8. Nodular bony prominence at the left pelvis on rectal exam 06/05/2015-likely a benign finding 9.  Right ureter stone/hydroureteronephrosis on CT 01/13/2021-referred to urology; status post right ureteroscopy with stone extraction and ureteral stent placement.  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 Laguna appears unchanged.  She will resume systemic therapy with single agent carboplatin today.  We reviewed potential toxicities associated with carboplatin including the chance of an allergic reaction and nausea.  She agrees to proceed.  The plan is to administer carboplatin on a 3 out of 4-week schedule.  Ms. Mchatton will return for an office visit in 2 weeks.  [name], MD  09/14/2022  9:06 AM

## 2022-09-14 NOTE — Progress Notes (Signed)
Patient seen by Dr. Sherrill today ? ?Vitals are within treatment parameters. ? ?Labs reviewed by Dr. Sherrill and are within treatment parameters. ? ?Per physician team, patient is ready for treatment and there are NO modifications to the treatment plan.  ?

## 2022-09-14 NOTE — Patient Instructions (Signed)
[facility]  Discharge Instructions: Thank you for choosing [facility] to provide your oncology and hematology care.   If you have a lab appointment with the [facility], please go directly to the [facility] and check in at the registration area.   Wear comfortable clothing and clothing appropriate for easy access to any Portacath or PICC line.   We strive to give you quality time with your provider. You may need to reschedule your appointment if you arrive late (15 or more minutes).  Arriving late affects you and other patients whose appointments are after yours.  Also, if you miss three or more appointments without notifying the office, you may be dismissed from the clinic at the provider's discretion.      For prescription refill requests, have your pharmacy contact our office and allow 72 hours for refills to be completed.    Today you received the following chemotherapy and/or immunotherapy agents Carboplatin      To help prevent nausea and vomiting after your treatment, we encourage you to take your nausea medication as directed.  BELOW ARE SYMPTOMS THAT SHOULD BE REPORTED IMMEDIATELY: *FEVER GREATER THAN 100.4 F (38 C) OR HIGHER *CHILLS OR SWEATING *NAUSEA AND VOMITING THAT IS NOT CONTROLLED WITH YOUR NAUSEA MEDICATION *UNUSUAL SHORTNESS OF BREATH *UNUSUAL BRUISING OR BLEEDING *URINARY PROBLEMS (pain or burning when urinating, or frequent urination) *BOWEL PROBLEMS (unusual diarrhea, constipation, pain near the anus) TENDERNESS IN MOUTH AND THROAT WITH OR WITHOUT PRESENCE OF ULCERS (sore throat, sores in mouth, or a toothache) UNUSUAL RASH, SWELLING OR PAIN  UNUSUAL VAGINAL DISCHARGE OR ITCHING   Items with * indicate a potential emergency and should be followed up as soon as possible or go to the Emergency Department if any problems should occur.  Please show the CHEMOTHERAPY ALERT CARD or IMMUNOTHERAPY ALERT CARD at check-in to  the Emergency Department and triage nurse.  Should you have questions after your visit or need to cancel or reschedule your appointment, please contact [facility]  Dept: [phone]  and follow the prompts.  Office hours are 8:00 a.m. to 4:30 p.m. Monday - Friday. Please note that voicemails left after 4:00 p.m. may not be returned until the following business day.  We are closed weekends and major holidays. You have access to a nurse at all times for urgent questions. Please call the main number to the clinic Dept: [phone] and follow the prompts.   For any non-urgent questions, you may also contact your provider using MyChart. We now offer e-Visits for anyone 52 and older to request care online for non-urgent symptoms. For details visit [email or web address].   Also download the MyChart app! Go to the app store, search "MyChart", open the app, select [facility], and log in with your MyChart username and password.  Masks are optional in the cancer centers. If you would like for your care team to wear a mask while they are taking care of you, please let them know. You may have one support person who is at least 72 years old accompany you for your appointments.

## 2022-09-14 NOTE — Progress Notes (Signed)
Lidocaine-Prilocaine ointment approved by plan from 09/14/22-09/15/23.

## 2022-09-15 LAB — CA 125: Cancer Antigen (CA) 125: 76.9 U/mL — ABNORMAL HIGH (ref 0.0–38.1)

## 2022-09-18 ENCOUNTER — Other Ambulatory Visit: Payer: Self-pay | Admitting: Oncology

## 2022-09-21 ENCOUNTER — Inpatient Hospital Stay: Payer: Medicare Other | Admitting: Nurse Practitioner

## 2022-09-21 ENCOUNTER — Encounter: Payer: Self-pay | Admitting: Nurse Practitioner

## 2022-09-21 ENCOUNTER — Inpatient Hospital Stay: Payer: Medicare Other

## 2022-09-21 VITALS — BP 121/70 | HR 72 | Resp 20

## 2022-09-21 VITALS — BP 119/69 | HR 80 | Temp 98.1°F | Resp 18 | Ht 61.0 in | Wt 156.6 lb

## 2022-09-21 DIAGNOSIS — C569 Malignant neoplasm of unspecified ovary: Secondary | ICD-10-CM | POA: Diagnosis not present

## 2022-09-21 DIAGNOSIS — R197 Diarrhea, unspecified: Secondary | ICD-10-CM | POA: Diagnosis not present

## 2022-09-21 DIAGNOSIS — R110 Nausea: Secondary | ICD-10-CM | POA: Diagnosis not present

## 2022-09-21 DIAGNOSIS — G893 Neoplasm related pain (acute) (chronic): Secondary | ICD-10-CM | POA: Diagnosis not present

## 2022-09-21 DIAGNOSIS — F419 Anxiety disorder, unspecified: Secondary | ICD-10-CM | POA: Diagnosis not present

## 2022-09-21 DIAGNOSIS — Z90722 Acquired absence of ovaries, bilateral: Secondary | ICD-10-CM | POA: Diagnosis not present

## 2022-09-21 DIAGNOSIS — R918 Other nonspecific abnormal finding of lung field: Secondary | ICD-10-CM | POA: Diagnosis not present

## 2022-09-21 DIAGNOSIS — M549 Dorsalgia, unspecified: Secondary | ICD-10-CM | POA: Diagnosis not present

## 2022-09-21 DIAGNOSIS — Z90710 Acquired absence of both cervix and uterus: Secondary | ICD-10-CM | POA: Diagnosis not present

## 2022-09-21 DIAGNOSIS — C786 Secondary malignant neoplasm of retroperitoneum and peritoneum: Secondary | ICD-10-CM | POA: Diagnosis not present

## 2022-09-21 DIAGNOSIS — Z923 Personal history of irradiation: Secondary | ICD-10-CM | POA: Diagnosis not present

## 2022-09-21 DIAGNOSIS — Z5111 Encounter for antineoplastic chemotherapy: Secondary | ICD-10-CM | POA: Diagnosis not present

## 2022-09-21 DIAGNOSIS — Z8543 Personal history of malignant neoplasm of ovary: Secondary | ICD-10-CM | POA: Diagnosis not present

## 2022-09-21 DIAGNOSIS — M542 Cervicalgia: Secondary | ICD-10-CM | POA: Diagnosis not present

## 2022-09-21 DIAGNOSIS — R300 Dysuria: Secondary | ICD-10-CM | POA: Diagnosis not present

## 2022-09-21 DIAGNOSIS — R180 Malignant ascites: Secondary | ICD-10-CM | POA: Diagnosis not present

## 2022-09-21 LAB — CMP (CANCER CENTER ONLY)
ALT: 23 U/L (ref 0–44)
AST: 20 U/L (ref 15–41)
Albumin: 4 g/dL (ref 3.5–5.0)
Alkaline Phosphatase: 55 U/L (ref 38–126)
Anion gap: 9 (ref 5–15)
BUN: 21 mg/dL (ref 8–23)
CO2: 26 mmol/L (ref 22–32)
Calcium: 9.1 mg/dL (ref 8.9–10.3)
Chloride: 106 mmol/L (ref 98–111)
Creatinine: 0.66 mg/dL (ref 0.44–1.00)
GFR, Estimated: >60 mL/min (ref >60)
Glucose, Bld: 112 mg/dL — ABNORMAL HIGH (ref 70–99)
Potassium: 3.8 mmol/L (ref 3.5–5.1)
Sodium: 141 mmol/L (ref 135–145)
Total Bilirubin: 0.4 mg/dL (ref 0.3–1.2)
Total Protein: 6.3 g/dL — ABNORMAL LOW (ref 6.5–8.1)

## 2022-09-21 LAB — CBC WITH DIFFERENTIAL (CANCER CENTER ONLY)
Abs Immature Granulocytes: 0.02 10*3/uL (ref 0.00–0.07)
Basophils Absolute: 0 10*3/uL (ref 0.0–0.1)
Basophils Relative: 1 %
Eosinophils Absolute: 0.1 10*3/uL (ref 0.0–0.5)
Eosinophils Relative: 2 %
HCT: 40 % (ref 36.0–46.0)
Hemoglobin: 12.7 g/dL (ref 12.0–15.0)
Immature Granulocytes: 0 %
Lymphocytes Relative: 14 %
Lymphs Abs: 0.8 10*3/uL (ref 0.7–4.0)
MCH: 32.2 pg (ref 26.0–34.0)
MCHC: 31.8 g/dL (ref 30.0–36.0)
MCV: 101.5 fL — ABNORMAL HIGH (ref 80.0–100.0)
Monocytes Absolute: 0.6 10*3/uL (ref 0.1–1.0)
Monocytes Relative: 11 %
Neutro Abs: 4.1 10*3/uL (ref 1.7–7.7)
Neutrophils Relative %: 72 %
Platelet Count: 240 10*3/uL (ref 150–400)
RBC: 3.94 MIL/uL (ref 3.87–5.11)
RDW: 13 % (ref 11.5–15.5)
WBC Count: 5.6 10*3/uL (ref 4.0–10.5)
nRBC: 0 % (ref 0.0–0.2)

## 2022-09-21 MED ORDER — PALONOSETRON HCL INJECTION 0.25 MG/5ML
0.2500 mg | Freq: Once | INTRAVENOUS | Status: AC
  Administered 2022-09-21: 0.25 mg via INTRAVENOUS
  Filled 2022-09-21: qty 5

## 2022-09-21 MED ORDER — HEPARIN SOD (PORK) LOCK FLUSH 100 UNIT/ML IV SOLN
500.0000 [IU] | Freq: Once | INTRAVENOUS | Status: AC | PRN
  Administered 2022-09-21: 500 [IU]

## 2022-09-21 MED ORDER — SODIUM CHLORIDE 0.9 % IV SOLN
161.2000 mg | Freq: Once | INTRAVENOUS | Status: AC
  Administered 2022-09-21: 160 mg via INTRAVENOUS
  Filled 2022-09-21: qty 16

## 2022-09-21 MED ORDER — FAMOTIDINE IN NACL 20-0.9 MG/50ML-% IV SOLN
20.0000 mg | Freq: Once | INTRAVENOUS | Status: AC
  Administered 2022-09-21: 20 mg via INTRAVENOUS
  Filled 2022-09-21: qty 50

## 2022-09-21 MED ORDER — SODIUM CHLORIDE 0.9 % IV SOLN
10.0000 mg | Freq: Once | INTRAVENOUS | Status: AC
  Administered 2022-09-21: 10 mg via INTRAVENOUS
  Filled 2022-09-21: qty 10

## 2022-09-21 MED ORDER — SODIUM CHLORIDE 0.9% FLUSH
10.0000 mL | INTRAVENOUS | Status: DC | PRN
  Administered 2022-09-21: 10 mL

## 2022-09-21 MED ORDER — DIPHENHYDRAMINE HCL 50 MG/ML IJ SOLN
25.0000 mg | Freq: Once | INTRAMUSCULAR | Status: AC
  Administered 2022-09-21: 25 mg via INTRAVENOUS
  Filled 2022-09-21: qty 1

## 2022-09-21 MED ORDER — SODIUM CHLORIDE 0.9 % IV SOLN: Freq: Once | INTRAVENOUS | Status: AC

## 2022-09-21 NOTE — Progress Notes (Signed)
[facility] OFFICE PROGRESS NOTE   Diagnosis: Ovarian cancer  INTERVAL HISTORY:   [name] [name] returns as scheduled.  She completed cycle 1 day 1 carboplatin on 09/14/2022.  She denies nausea/vomiting.  No mouth sores.  No diarrhea.  Recent mild constipation.  No signs of allergic reaction.  Stable intermittent pain at the right low abdomen.  Objective:  Vital signs in last 24 hours:  Blood pressure 119/69, pulse 80, temperature 98.1 F (36.7 C), temperature source Oral, resp. rate 18, height _0  (1.549 m), weight 156 lb 9.6 oz (71 kg), SpO2 100 %.    HEENT: No thrush or ulcers. Resp: Lungs clear bilaterally. Cardio: Regular rate and rhythm. GI: Abdomen soft.  Mild tenderness at the right low abdomen.  No hepatosplenomegaly.  No mass. Vascular: No leg edema.   Skin: No rash. Port-A-Cath without erythema.  Lab Results:  Lab Results  Component Value Date   WBC 5.6 09/21/2022   HGB 12.7 09/21/2022   HCT 40.0 09/21/2022   MCV 101.5 (H) 09/21/2022   PLT 240 09/21/2022   NEUTROABS 4.1 09/21/2022    Imaging:  No results found.  Medications: I have reviewed the patient's current medications.  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 A 5 mm nodules remain on the right diaphragm. - TumorNext paired germline/tumor analyses: No somatic variants detected, germline CHEK2      VUS      Cycle 1 of adjuvant Taxol/carboplatin chemotherapy initiated on 09/19/2012.   The CA 125 normalized.   She completed day 15 of cycle 6 on 02/06/2013.   Restaging CT evaluation 03/29/2013 showed no evidence of metastatic disease in the chest. There was marked improvement in appearance/resolution of previous described peritoneal/omental disease. There was no convincing evidence of residual disease. There was minimal  increased density in the region of the omentum favored to be treatment-related. There was no pelvic adenopathy. CA125 3.2 on 02/19/2014. 08/18/2016 CA-125 8 CT abdomen/pelvis 08/25/2016-solitary new enlarged right external iliac lymph node measuring 2.2 cm. Biopsy 09/07/2016-adenocarcinoma consistent with high-grade serous carcinoma. PET scan 09/22/2016-malignant range FDG uptake associated with the enlarged right external iliac lymph node compatible with metastatic adenopathy.  No additional sites of metastatic disease identified. Radiation 10/25/2016-12/01/2016 PET scan 03/28/2017-previously enlarged hypermetabolic right external iliac node-normal in size with resolution of hypermetabolic activity, no active malignancy identified PET scan 05/15/2018-no evidence of recurrent or metastatic disease, no hypermetabolic lymph nodes CT abdomen/pelvis 09/19/2019-mild increase in the size of several left upper quadrant peritoneal nodules measuring up to 11 mm.  No other sites of metastatic disease identified.  No ascites. CT abdomen/pelvis 03/14/2020-slight enlargement of several left upper quadrant peritoneal nodules, no ascites, no other evidence of disease progression CT abdomen/pelvis 09/12/2020 peritoneal nodularity/omental caking predominantly in the left upper/mid abdomen, mildly progressive.  Largest implant in the left upper abdomen adjacent to the stomach now measures 17 mm, previously 13 mm.  Overall volume of peritoneal disease has mildly progressed. CT abdomen/pelvis 01/13/2021- 4 mm right ureteral calculus with moderate right hydronephrosis and upper right hydroureter, progressive omental nodularity, stable calcified and partially calcified right pelvic nodules CT abdomen/pelvis 05/13/2021-enlargement of left upper quadrant omental nodules and a peritoneal nodule at the right iliac fossa, no ascites CT abdomen/pelvis 08/14/2021-mild increase in size of omental nodules, no ascites, no new site of  metastatic disease Taxol/carboplatin 09/09/2021, 09/15/2021, 09/22/2021 Taxol/carboplatin 10/07/2021, 10/13/2021, 10/20/2021 Taxol/Carboplatin 11/03/2021, 11/10/2021,  11/17/2021 CT 11/23/2021-decrease in peritoneal metastases, no new or progressive disease, airspace opacity at both lung bases Taxol/carboplatin 12/01/2021, 12/08/2021, 12/15/2021 Taxol/Carboplatin every 2 weeks beginning 12/29/2021 Carboplatin alone 01/26/2022, Taxol held due to neuropathy Carboplatin alone 02/09/2022, Taxol held due to neuropathy Carboplatin alone 02/23/2022, Taxol held due to neuropathy Carboplatin alone 03/09/2022, Taxol held due to neuropathy CTs 03/25/2022-no significant change in omental, mesenteric disease.  No new or clearly progressive findings.  Previously noted patchy areas of consolidative opacity at the lung bases have resolved. Treatment break beginning 03/26/2022 CT/pelvis 08/10/2022-increased size of peritoneal nodules, increased small volume ascites, mild right lower lobe pleural nodularity Cycle 1 salvage carboplatin 09/14/2022 (weekly x 3 followed by 1 week break)   2. Low abdomen/suprapubic pain prior to the exploratory laparotomy-likely secondary to omental/pelvic tumor; persistent mild pain in the lower abdomen   3. Chronic neck and back pain.   4. Anxiety -persistent despite Lexapro and Xanax. She has been evaluated by psychiatry. Currently on Lexapro. 5. Status post Port-A-Cath placement 09/22/2012. The Port-A-Cath was removed on 04/03/2013.   6. Neutropenia secondary to chemotherapy- day 15 cycle 1 and cycle 3. Taxol/carboplatin not given secondary to neutropenia.    7. Herpes zoster involving a right thoracic dermatome July 2015. She completed a course of Valtrex. 8. Nodular bony prominence at the left pelvis on rectal exam 06/05/2015-likely a benign finding 9.  Right ureter stone/hydroureteronephrosis on CT 01/13/2021-referred to urology; status post right ureteroscopy with stone extraction and ureteral stent  placement.  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  Plan to proceed with cycle 1 week 2 carboplatin today as scheduled.  CBC reviewed.  Counts adequate to proceed as above.  She will return for follow-up and cycle 1 week 3 carboplatin 09/28/2022.  She will contact the office in the interim with any problems.    Ned Card ANP/GNP-BC   09/21/2022  8:48 AM

## 2022-09-21 NOTE — Patient Instructions (Signed)
[facility]   Discharge Instructions: Thank you for choosing [facility] to provide your oncology and hematology care.   If you have a lab appointment with the [facility], please go directly to the [facility] and check in at the registration area.   Wear comfortable clothing and clothing appropriate for easy access to any Portacath or PICC line.   We strive to give you quality time with your provider. You may need to reschedule your appointment if you arrive late (15 or more minutes).  Arriving late affects you and other patients whose appointments are after yours.  Also, if you miss three or more appointments without notifying the office, you may be dismissed from the clinic at the provider's discretion.      For prescription refill requests, have your pharmacy contact our office and allow 72 hours for refills to be completed.    Today you received the following chemotherapy and/or immunotherapy agents Carboplatin (PARAPLATIN).      To help prevent nausea and vomiting after your treatment, we encourage you to take your nausea medication as directed.  BELOW ARE SYMPTOMS THAT SHOULD BE REPORTED IMMEDIATELY: *FEVER GREATER THAN 100.4 F (38 C) OR HIGHER *CHILLS OR SWEATING *NAUSEA AND VOMITING THAT IS NOT CONTROLLED WITH YOUR NAUSEA MEDICATION *UNUSUAL SHORTNESS OF BREATH *UNUSUAL BRUISING OR BLEEDING *URINARY PROBLEMS (pain or burning when urinating, or frequent urination) *BOWEL PROBLEMS (unusual diarrhea, constipation, pain near the anus) TENDERNESS IN MOUTH AND THROAT WITH OR WITHOUT PRESENCE OF ULCERS (sore throat, sores in mouth, or a toothache) UNUSUAL RASH, SWELLING OR PAIN  UNUSUAL VAGINAL DISCHARGE OR ITCHING   Items with * indicate a potential emergency and should be followed up as soon as possible or go to the Emergency Department if any problems should occur.  Please show the CHEMOTHERAPY ALERT CARD or IMMUNOTHERAPY ALERT CARD  at check-in to the Emergency Department and triage nurse.  Should you have questions after your visit or need to cancel or reschedule your appointment, please contact [facility]  Dept: [phone]  and follow the prompts.  Office hours are 8:00 a.m. to 4:30 p.m. Monday - Friday. Please note that voicemails left after 4:00 p.m. may not be returned until the following business day.  We are closed weekends and major holidays. You have access to a nurse at all times for urgent questions. Please call the main number to the clinic Dept: [phone] and follow the prompts.   For any non-urgent questions, you may also contact your provider using MyChart. We now offer e-Visits for anyone 89 and older to request care online for non-urgent symptoms. For details visit [email or web address].   Also download the MyChart app! Go to the app store, search "MyChart", open the app, select [facility], and log in with your MyChart username and password.  Masks are optional in the cancer centers. If you would like for your care team to wear a mask while they are taking care of you, please let them know. You may have one support person who is at least 72 years old accompany you for your appointments.  Carboplatin Injection What is this medication? CARBOPLATIN (KAR boe pla tin) treats some types of cancer. It works by slowing down the growth of cancer cells. This medicine may be used for other purposes; ask your health care provider or pharmacist if you have questions. COMMON BRAND NAME(S): Paraplatin What should I tell my care team before I take  this medication? They need to know if you have any of these conditions: Blood disorders Hearing problems Kidney disease Recent or ongoing radiation therapy An unusual or allergic reaction to carboplatin, cisplatin, other medications, foods, dyes, or preservatives Pregnant or trying to get pregnant Breast-feeding How should I use this  medication? This medication is injected into a vein. It is given by your care team in a hospital or clinic setting. Talk to your care team about the use of this medication in children. Special care may be needed. Overdosage: If you think you have taken too much of this medicine contact a poison control center or emergency room at once. NOTE: This medicine is only for you. Do not share this medicine with others. What if I miss a dose? Keep appointments for follow-up doses. It is important not to miss your dose. Call your care team if you are unable to keep an appointment. What may interact with this medication? Medications for seizures Some antibiotics, such as amikacin, gentamicin, neomycin, streptomycin, tobramycin Vaccines This list may not describe all possible interactions. Give your health care provider a list of all the medicines, herbs, non-prescription drugs, or dietary supplements you use. Also tell them if you smoke, drink alcohol, or use illegal drugs. Some items may interact with your medicine. What should I watch for while using this medication? Your condition will be monitored carefully while you are receiving this medication. You may need blood work while taking this medication. This medication may make you feel generally unwell. This is not uncommon, as chemotherapy can affect healthy cells as well as cancer cells. Report any side effects. Continue your course of treatment even though you feel ill unless your care team tells you to stop. In some cases, you may be given additional medications to help with side effects. Follow all directions for their use. This medication may increase your risk of getting an infection. Call your care team for advice if you get a fever, chills, sore throat, or other symptoms of a cold or flu. Do not treat yourself. Try to avoid being around people who are sick. Avoid taking medications that contain aspirin, acetaminophen, ibuprofen, naproxen, or  ketoprofen unless instructed by your care team. These medications may hide a fever. Be careful brushing or flossing your teeth or using a toothpick because you may get an infection or bleed more easily. If you have any dental work done, tell your dentist you are receiving this medication. Talk to your care team if you wish to become pregnant or think you might be pregnant. This medication can cause serious birth defects. Talk to your care team about effective forms of contraception. Do not breast-feed while taking this medication. What side effects may I notice from receiving this medication? Side effects that you should report to your care team as soon as possible: Allergic reactions--skin rash, itching, hives, swelling of the face, lips, tongue, or throat Infection--fever, chills, cough, sore throat, wounds that don't heal, pain or trouble when passing urine, general feeling of discomfort or being unwell Low red blood cell level--unusual weakness or fatigue, dizziness, headache, trouble breathing Pain, tingling, or numbness in the hands or feet, muscle weakness, change in vision, confusion or trouble speaking, loss of balance or coordination, trouble walking, seizures Unusual bruising or bleeding Side effects that usually do not require medical attention (report to your care team if they continue or are bothersome): Hair loss Nausea Unusual weakness or fatigue Vomiting This list may not describe all possible side  effects. Call your doctor for medical advice about side effects. You may report side effects to FDA at 1-800-FDA-1088. Where should I keep my medication? This medication is given in a hospital or clinic. It will not be stored at home. NOTE: This sheet is a summary. It may not cover all possible information. If you have questions about this medicine, talk to your doctor, pharmacist, or health care provider.  2023 Elsevier/Gold Standard (2022-01-04 00:00:00)

## 2022-09-27 ENCOUNTER — Other Ambulatory Visit: Payer: Self-pay | Admitting: Oncology

## 2022-09-28 ENCOUNTER — Inpatient Hospital Stay: Payer: Medicare Other

## 2022-09-28 ENCOUNTER — Encounter: Payer: Self-pay | Admitting: *Deleted

## 2022-09-28 ENCOUNTER — Inpatient Hospital Stay: Payer: Medicare Other | Admitting: Oncology

## 2022-09-28 ENCOUNTER — Inpatient Hospital Stay: Payer: Medicare Other | Admitting: Licensed Clinical Social Worker

## 2022-09-28 VITALS — BP 118/81 | HR 86 | Temp 98.1°F | Resp 18 | Ht 61.0 in | Wt 155.2 lb

## 2022-09-28 DIAGNOSIS — C569 Malignant neoplasm of unspecified ovary: Secondary | ICD-10-CM

## 2022-09-28 DIAGNOSIS — Z923 Personal history of irradiation: Secondary | ICD-10-CM | POA: Diagnosis not present

## 2022-09-28 DIAGNOSIS — R197 Diarrhea, unspecified: Secondary | ICD-10-CM | POA: Diagnosis not present

## 2022-09-28 DIAGNOSIS — R110 Nausea: Secondary | ICD-10-CM | POA: Diagnosis not present

## 2022-09-28 DIAGNOSIS — Z90722 Acquired absence of ovaries, bilateral: Secondary | ICD-10-CM | POA: Diagnosis not present

## 2022-09-28 DIAGNOSIS — R918 Other nonspecific abnormal finding of lung field: Secondary | ICD-10-CM | POA: Diagnosis not present

## 2022-09-28 DIAGNOSIS — M542 Cervicalgia: Secondary | ICD-10-CM | POA: Diagnosis not present

## 2022-09-28 DIAGNOSIS — G893 Neoplasm related pain (acute) (chronic): Secondary | ICD-10-CM | POA: Diagnosis not present

## 2022-09-28 DIAGNOSIS — Z90710 Acquired absence of both cervix and uterus: Secondary | ICD-10-CM | POA: Diagnosis not present

## 2022-09-28 DIAGNOSIS — F419 Anxiety disorder, unspecified: Secondary | ICD-10-CM | POA: Diagnosis not present

## 2022-09-28 DIAGNOSIS — R300 Dysuria: Secondary | ICD-10-CM | POA: Diagnosis not present

## 2022-09-28 DIAGNOSIS — Z95828 Presence of other vascular implants and grafts: Secondary | ICD-10-CM

## 2022-09-28 DIAGNOSIS — Z8543 Personal history of malignant neoplasm of ovary: Secondary | ICD-10-CM | POA: Diagnosis not present

## 2022-09-28 DIAGNOSIS — R180 Malignant ascites: Secondary | ICD-10-CM | POA: Diagnosis not present

## 2022-09-28 DIAGNOSIS — Z5111 Encounter for antineoplastic chemotherapy: Secondary | ICD-10-CM | POA: Diagnosis not present

## 2022-09-28 DIAGNOSIS — M549 Dorsalgia, unspecified: Secondary | ICD-10-CM | POA: Diagnosis not present

## 2022-09-28 DIAGNOSIS — C786 Secondary malignant neoplasm of retroperitoneum and peritoneum: Secondary | ICD-10-CM | POA: Diagnosis not present

## 2022-09-28 LAB — CBC WITH DIFFERENTIAL (CANCER CENTER ONLY)
Abs Immature Granulocytes: 0.02 10*3/uL (ref 0.00–0.07)
Basophils Absolute: 0 10*3/uL (ref 0.0–0.1)
Basophils Relative: 1 %
Eosinophils Absolute: 0.1 10*3/uL (ref 0.0–0.5)
Eosinophils Relative: 2 %
HCT: 40.2 % (ref 36.0–46.0)
Hemoglobin: 13.1 g/dL (ref 12.0–15.0)
Immature Granulocytes: 0 %
Lymphocytes Relative: 13 %
Lymphs Abs: 0.8 10*3/uL (ref 0.7–4.0)
MCH: 32.8 pg (ref 26.0–34.0)
MCHC: 32.6 g/dL (ref 30.0–36.0)
MCV: 100.5 fL — ABNORMAL HIGH (ref 80.0–100.0)
Monocytes Absolute: 0.6 10*3/uL (ref 0.1–1.0)
Monocytes Relative: 10 %
Neutro Abs: 4.5 10*3/uL (ref 1.7–7.7)
Neutrophils Relative %: 74 %
Platelet Count: 238 10*3/uL (ref 150–400)
RBC: 4 MIL/uL (ref 3.87–5.11)
RDW: 13.1 % (ref 11.5–15.5)
WBC Count: 6 10*3/uL (ref 4.0–10.5)
nRBC: 0 % (ref 0.0–0.2)

## 2022-09-28 LAB — CMP (CANCER CENTER ONLY)
ALT: 21 U/L (ref 0–44)
AST: 18 U/L (ref 15–41)
Albumin: 4.1 g/dL (ref 3.5–5.0)
Alkaline Phosphatase: 53 U/L (ref 38–126)
Anion gap: 9 (ref 5–15)
BUN: 27 mg/dL — ABNORMAL HIGH (ref 8–23)
CO2: 27 mmol/L (ref 22–32)
Calcium: 9.5 mg/dL (ref 8.9–10.3)
Chloride: 104 mmol/L (ref 98–111)
Creatinine: 0.67 mg/dL (ref 0.44–1.00)
GFR, Estimated: >60 mL/min (ref >60)
Glucose, Bld: 106 mg/dL — ABNORMAL HIGH (ref 70–99)
Potassium: 3.8 mmol/L (ref 3.5–5.1)
Sodium: 140 mmol/L (ref 135–145)
Total Bilirubin: 0.5 mg/dL (ref 0.3–1.2)
Total Protein: 6.7 g/dL (ref 6.5–8.1)

## 2022-09-28 MED ORDER — HEPARIN SOD (PORK) LOCK FLUSH 100 UNIT/ML IV SOLN
500.0000 [IU] | Freq: Once | INTRAVENOUS | Status: AC | PRN
  Administered 2022-09-28: 500 [IU]

## 2022-09-28 MED ORDER — PALONOSETRON HCL INJECTION 0.25 MG/5ML
0.2500 mg | Freq: Once | INTRAVENOUS | Status: AC
  Administered 2022-09-28: 0.25 mg via INTRAVENOUS
  Filled 2022-09-28: qty 5

## 2022-09-28 MED ORDER — SODIUM CHLORIDE 0.9 % IV SOLN: Freq: Once | INTRAVENOUS | Status: AC

## 2022-09-28 MED ORDER — DIPHENHYDRAMINE HCL 50 MG/ML IJ SOLN
25.0000 mg | Freq: Once | INTRAMUSCULAR | Status: AC
  Administered 2022-09-28: 25 mg via INTRAVENOUS
  Filled 2022-09-28: qty 1

## 2022-09-28 MED ORDER — SODIUM CHLORIDE 0.9 % IV SOLN
10.0000 mg | Freq: Once | INTRAVENOUS | Status: AC
  Administered 2022-09-28: 10 mg via INTRAVENOUS
  Filled 2022-09-28: qty 10

## 2022-09-28 MED ORDER — SODIUM CHLORIDE 0.9% FLUSH
10.0000 mL | INTRAVENOUS | Status: DC | PRN
  Administered 2022-09-28: 10 mL

## 2022-09-28 MED ORDER — SODIUM CHLORIDE 0.9 % IV SOLN
160.0000 mg | Freq: Once | INTRAVENOUS | Status: AC
  Administered 2022-09-28: 160 mg via INTRAVENOUS
  Filled 2022-09-28: qty 16

## 2022-09-28 MED ORDER — FAMOTIDINE IN NACL 20-0.9 MG/50ML-% IV SOLN
20.0000 mg | Freq: Once | INTRAVENOUS | Status: AC
  Administered 2022-09-28: 20 mg via INTRAVENOUS
  Filled 2022-09-28: qty 50

## 2022-09-28 MED ORDER — SODIUM CHLORIDE 0.9% FLUSH
10.0000 mL | INTRAVENOUS | Status: DC | PRN
  Administered 2022-09-28: 10 mL via INTRAVENOUS

## 2022-09-28 NOTE — Progress Notes (Signed)
[facility]  Initial Assessment   [name] [name] is a 71 y.o. year old female presenting alone. Clinical Social Work was referred by new patient protocol for assessment of psychosocial needs.   SDOH (Social Determinants of Health) assessments performed: Yes SDOH Interventions    Flowsheet Row Social Work from 11/17/2021 in [facility] Follow Up Appointment 15 from 06/02/2017 in [facility] Follow Up 20 from 12/30/2016 in [facility] Interventions     Food Insecurity Interventions Intervention Not Indicated -- --  Housing Interventions Intervention Not Indicated -- --  Transportation Interventions Intervention Not Indicated -- --  Depression Interventions/Treatment  -- Currently on Treatment Counseling  Financial Strain Interventions Intervention Not Indicated -- --  Stress Interventions Provide Counseling -- --  Social Connections Interventions Intervention Not Indicated -- --       SDOH Screenings   Food Insecurity: No Food Insecurity (11/17/2021)  Housing: Low Risk  (11/17/2021)  Transportation Needs: No Transportation Needs (11/17/2021)  Alcohol Screen: Low Risk  (11/17/2021)  Depression (PHQ2-9): Low Risk  (03/08/2022)  Financial Resource Strain: Low Risk  (11/17/2021)  Physical Activity: Insufficiently Active (11/17/2021)  Social Connections: Moderately Isolated (11/17/2021)  Stress: Stress Concern Present (11/17/2021)  Tobacco Use: Low Risk  (09/21/2022)     Distress Screen completed: No     No data to display            Family/Social Information:  Housing Arrangement: patient lives with her son.  She is separated from her husband. Family members/support persons in your life? Family, Friends, and [employer] concerns: no.  Patient reports driving herself everywhere. Employment: Retired  Income source: Paediatric nurse concerns:  No Type of concern: None Food access concerns: no Religious or spiritual practice: Yes-Patient reports her church is very supportive. Services Currently in place:  [employer]  Coping/ Adjustment to diagnosis: Patient understands treatment plan and what happens next? Yes.  Patient has had cancer previously and is very familiar with cancer treatment. Concerns about diagnosis and/or treatment: I'm not especially worried about anything Patient reported stressors:  Patient said she does not currently have any stressors. Hopes and/or priorities: "God will take care of me." Patient enjoys gardening.  Patient said she loves working in her yard. Current coping skills/ strengths: Active sense of humor , Capable of independent living , Communication skills , Financial means , General fund of knowledge , Motivation for treatment/growth , Religious Affiliation , and Special hobby/interest     SUMMARY: Current SDOH Barriers:  Patient did not express any barriers at this time.  Clinical Social Work Clinical Goal(s):  No clinical social work goals at this time  Interventions: Discussed common feeling and emotions when being diagnosed with cancer, and the importance of support during treatment Informed patient of the support team roles and support services at [facility] Provided [facility] contact information and encouraged patient to call with any questions or concerns Provided patient with information about [employer] and [employer], which she accepted.   Follow Up Plan: Patient will contact CSW with any support or resource needs Patient verbalizes understanding of plan: Yes    Rodman Pickle Kyheem Bathgate, LCSW

## 2022-09-28 NOTE — Patient Instructions (Signed)
[facility]  Discharge Instructions: Thank you for choosing [facility] to provide your oncology and hematology care.   If you have a lab appointment with the [facility], please go directly to the [facility] and check in at the registration area.   Wear comfortable clothing and clothing appropriate for easy access to any Portacath or PICC line.   We strive to give you quality time with your provider. You may need to reschedule your appointment if you arrive late (15 or more minutes).  Arriving late affects you and other patients whose appointments are after yours.  Also, if you miss three or more appointments without notifying the office, you may be dismissed from the clinic at the provider's discretion.      For prescription refill requests, have your pharmacy contact our office and allow 72 hours for refills to be completed.    Today you received the following chemotherapy and/or immunotherapy agents Carboplatin      To help prevent nausea and vomiting after your treatment, we encourage you to take your nausea medication as directed.  BELOW ARE SYMPTOMS THAT SHOULD BE REPORTED IMMEDIATELY: *FEVER GREATER THAN 100.4 F (38 C) OR HIGHER *CHILLS OR SWEATING *NAUSEA AND VOMITING THAT IS NOT CONTROLLED WITH YOUR NAUSEA MEDICATION *UNUSUAL SHORTNESS OF BREATH *UNUSUAL BRUISING OR BLEEDING *URINARY PROBLEMS (pain or burning when urinating, or frequent urination) *BOWEL PROBLEMS (unusual diarrhea, constipation, pain near the anus) TENDERNESS IN MOUTH AND THROAT WITH OR WITHOUT PRESENCE OF ULCERS (sore throat, sores in mouth, or a toothache) UNUSUAL RASH, SWELLING OR PAIN  UNUSUAL VAGINAL DISCHARGE OR ITCHING   Items with * indicate a potential emergency and should be followed up as soon as possible or go to the Emergency Department if any problems should occur.  Please show the CHEMOTHERAPY ALERT CARD or IMMUNOTHERAPY ALERT CARD at check-in to  the Emergency Department and triage nurse.  Should you have questions after your visit or need to cancel or reschedule your appointment, please contact [facility]  Dept: [phone]  and follow the prompts.  Office hours are 8:00 a.m. to 4:30 p.m. Monday - Friday. Please note that voicemails left after 4:00 p.m. may not be returned until the following business day.  We are closed weekends and major holidays. You have access to a nurse at all times for urgent questions. Please call the main number to the clinic Dept: [phone] and follow the prompts.   For any non-urgent questions, you may also contact your provider using MyChart. We now offer e-Visits for anyone 61 and older to request care online for non-urgent symptoms. For details visit [email or web address].   Also download the MyChart app! Go to the app store, search "MyChart", open the app, select [facility], and log in with your MyChart username and password.  Masks are optional in the cancer centers. If you would like for your care team to wear a mask while they are taking care of you, please let them know. You may have one support person who is at least 73 years old accompany you for your appointments.

## 2022-09-28 NOTE — Progress Notes (Signed)
[facility] OFFICE PROGRESS NOTE   Diagnosis: Ovarian cancer  INTERVAL HISTORY:   [name] [name] completed treatment with weekly carboplatin on 09/14/2022 and 09/21/2022.  Mild nausea.  No symptom of allergic reaction.  She feels well.  No abdominal pain.  Objective:  Vital signs in last 24 hours:  Blood pressure 118/81, pulse 86, temperature 98.1 F (36.7 C), temperature source Oral, resp. rate 18, height _0  (1.549 m), weight 155 lb 3.2 oz (70.4 kg), SpO2 98 %.    HEENT: No thrush or ulcers, mild white coat over the tongue and at the anterior right buccal mucosa Resp: Lungs clear bilaterally Cardio: Regular rate and rhythm GI: No hepatosplenomegaly, no apparent ascites, nontender Vascular: No leg edema  Portacath/PICC-without erythema  Lab Results:  Lab Results  Component Value Date   WBC 6.0 09/28/2022   HGB 13.1 09/28/2022   HCT 40.2 09/28/2022   MCV 100.5 (H) 09/28/2022   PLT 238 09/28/2022   NEUTROABS 4.5 09/28/2022    CMP  Lab Results  Component Value Date   NA 140 09/28/2022   K 3.8 09/28/2022   CL 104 09/28/2022   CO2 27 09/28/2022   GLUCOSE 106 (H) 09/28/2022   BUN 27 (H) 09/28/2022   CREATININE 0.67 09/28/2022   CALCIUM 9.5 09/28/2022   PROT 6.7 09/28/2022   ALBUMIN 4.1 09/28/2022   AST 18 09/28/2022   ALT 21 09/28/2022   ALKPHOS 53 09/28/2022   BILITOT 0.5 09/28/2022   GFRNONAA >60 09/28/2022   GFRAA >60 03/13/2020    Lab Results  Component Value Date   CEA 0.8 07/13/2012   CA125 8 08/18/2016    Medications: I have reviewed the patient's current medications.   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 A 5 mm nodules remain on the right diaphragm. - TumorNext paired germline/tumor analyses: No somatic variants detected, germline CHEK2      VUS      Cycle  1 of adjuvant Taxol/carboplatin chemotherapy initiated on 09/19/2012.   The CA 125 normalized.   She completed day 15 of cycle 6 on 02/06/2013.   Restaging CT evaluation 03/29/2013 showed no evidence of metastatic disease in the chest. There was marked improvement in appearance/resolution of previous described peritoneal/omental disease. There was no convincing evidence of residual disease. There was minimal increased density in the region of the omentum favored to be treatment-related. There was no pelvic adenopathy. CA125 3.2 on 02/19/2014. 08/18/2016 CA-125 8 CT abdomen/pelvis 08/25/2016-solitary new enlarged right external iliac lymph node measuring 2.2 cm. Biopsy 09/07/2016-adenocarcinoma consistent with high-grade serous carcinoma. PET scan 09/22/2016-malignant range FDG uptake associated with the enlarged right external iliac lymph node compatible with metastatic adenopathy.  No additional sites of metastatic disease identified. Radiation 10/25/2016-12/01/2016 PET scan 03/28/2017-previously enlarged hypermetabolic right external iliac node-normal in size with resolution of hypermetabolic activity, no active malignancy identified PET scan 05/15/2018-no evidence of recurrent or metastatic disease, no hypermetabolic lymph nodes CT abdomen/pelvis 09/19/2019-mild increase in the size of several left upper quadrant peritoneal nodules measuring up to 11 mm.  No other sites of metastatic disease identified.  No ascites. CT abdomen/pelvis 03/14/2020-slight enlargement of several left upper quadrant peritoneal nodules, no ascites, no other evidence of disease progression CT abdomen/pelvis 09/12/2020 peritoneal nodularity/omental caking predominantly in the left upper/mid abdomen, mildly progressive.  Largest implant in the left upper abdomen adjacent to the stomach now measures 17 mm,  previously 13 mm.  Overall volume of peritoneal disease has mildly progressed. CT abdomen/pelvis 01/13/2021- 4 mm right  ureteral calculus with moderate right hydronephrosis and upper right hydroureter, progressive omental nodularity, stable calcified and partially calcified right pelvic nodules CT abdomen/pelvis 05/13/2021-enlargement of left upper quadrant omental nodules and a peritoneal nodule at the right iliac fossa, no ascites CT abdomen/pelvis 08/14/2021-mild increase in size of omental nodules, no ascites, no new site of metastatic disease Taxol/carboplatin 09/09/2021, 09/15/2021, 09/22/2021 Taxol/carboplatin 10/07/2021, 10/13/2021, 10/20/2021 Taxol/Carboplatin 11/03/2021, 11/10/2021, 11/17/2021 CT 11/23/2021-decrease in peritoneal metastases, no new or progressive disease, airspace opacity at both lung bases Taxol/carboplatin 12/01/2021, 12/08/2021, 12/15/2021 Taxol/Carboplatin every 2 weeks beginning 12/29/2021 Carboplatin alone 01/26/2022, Taxol held due to neuropathy Carboplatin alone 02/09/2022, Taxol held due to neuropathy Carboplatin alone 02/23/2022, Taxol held due to neuropathy Carboplatin alone 03/09/2022, Taxol held due to neuropathy CTs 03/25/2022-no significant change in omental, mesenteric disease.  No new or clearly progressive findings.  Previously noted patchy areas of consolidative opacity at the lung bases have resolved. Treatment break beginning 03/26/2022 CT/pelvis 08/10/2022-increased size of peritoneal nodules, increased small volume ascites, mild right lower lobe pleural nodularity Cycle 1 salvage carboplatin 09/14/2022 (weekly x 3 followed by 1 week break)   2. Low abdomen/suprapubic pain prior to the exploratory laparotomy-likely secondary to omental/pelvic tumor; persistent mild pain in the lower abdomen   3. Chronic neck and back pain.   4. Anxiety -persistent despite Lexapro and Xanax. She has been evaluated by psychiatry. Currently on Lexapro. 5. Status post Port-A-Cath placement 09/22/2012. The Port-A-Cath was removed on 04/03/2013.   6. Neutropenia secondary to chemotherapy- day 15 cycle 1 and  cycle 3. Taxol/carboplatin not given secondary to neutropenia.    7. Herpes zoster involving a right thoracic dermatome July 2015. She completed a course of Valtrex. 8. Nodular bony prominence at the left pelvis on rectal exam 06/05/2015-likely a benign finding 9.  Right ureter stone/hydroureteronephrosis on CT 01/13/2021-referred to urology; status post right ureteroscopy with stone extraction and ureteral stent placement.  Stent removal 03/11/2021 10.  Taxol neuropathy     Disposition: [name] [name] appears stable.  She will complete week 3 of cycle 1 salvage carboplatin today.  She appears to be tolerating the carboplatin well.  She will return for an office visit and chemotherapy in 2 weeks.  We will check the CA125 when she returns in 2 weeks.  [name], MD  09/28/2022  8:50 AM

## 2022-09-28 NOTE — Progress Notes (Signed)
Patient seen by Dr. Benay Spice today  Vitals are within treatment parameters.No intervention needed for BP 118/81  Labs reviewed by Dr. Benay Spice and are within treatment parameters.  Per physician team, patient is ready for treatment and there are NO modifications to the treatment plan.

## 2022-10-06 ENCOUNTER — Encounter: Payer: Self-pay | Admitting: Oncology

## 2022-10-08 DIAGNOSIS — H5203 Hypermetropia, bilateral: Secondary | ICD-10-CM | POA: Diagnosis not present

## 2022-10-12 ENCOUNTER — Inpatient Hospital Stay: Payer: Medicare Other

## 2022-10-12 ENCOUNTER — Inpatient Hospital Stay (HOSPITAL_BASED_OUTPATIENT_CLINIC_OR_DEPARTMENT_OTHER): Payer: Medicare Other | Admitting: Oncology

## 2022-10-12 ENCOUNTER — Encounter: Payer: Self-pay | Admitting: Oncology

## 2022-10-12 ENCOUNTER — Inpatient Hospital Stay: Payer: Medicare Other | Attending: Oncology

## 2022-10-12 VITALS — BP 138/78 | HR 68 | Resp 18

## 2022-10-12 VITALS — BP 124/75 | HR 63 | Temp 98.1°F | Resp 18 | Ht 61.0 in | Wt 154.6 lb

## 2022-10-12 DIAGNOSIS — Z452 Encounter for adjustment and management of vascular access device: Secondary | ICD-10-CM | POA: Diagnosis not present

## 2022-10-12 DIAGNOSIS — D701 Agranulocytosis secondary to cancer chemotherapy: Secondary | ICD-10-CM | POA: Insufficient documentation

## 2022-10-12 DIAGNOSIS — C569 Malignant neoplasm of unspecified ovary: Secondary | ICD-10-CM | POA: Diagnosis not present

## 2022-10-12 DIAGNOSIS — G620 Drug-induced polyneuropathy: Secondary | ICD-10-CM | POA: Diagnosis not present

## 2022-10-12 DIAGNOSIS — C786 Secondary malignant neoplasm of retroperitoneum and peritoneum: Secondary | ICD-10-CM | POA: Diagnosis not present

## 2022-10-12 DIAGNOSIS — Z5111 Encounter for antineoplastic chemotherapy: Secondary | ICD-10-CM | POA: Diagnosis not present

## 2022-10-12 LAB — CBC WITH DIFFERENTIAL (CANCER CENTER ONLY)
Abs Immature Granulocytes: 0.01 10*3/uL (ref 0.00–0.07)
Basophils Absolute: 0 10*3/uL (ref 0.0–0.1)
Basophils Relative: 1 %
Eosinophils Absolute: 0.1 10*3/uL (ref 0.0–0.5)
Eosinophils Relative: 2 %
HCT: 39.9 % (ref 36.0–46.0)
Hemoglobin: 13 g/dL (ref 12.0–15.0)
Immature Granulocytes: 0 %
Lymphocytes Relative: 15 %
Lymphs Abs: 0.8 10*3/uL (ref 0.7–4.0)
MCH: 32.7 pg (ref 26.0–34.0)
MCHC: 32.6 g/dL (ref 30.0–36.0)
MCV: 100.5 fL — ABNORMAL HIGH (ref 80.0–100.0)
Monocytes Absolute: 0.6 10*3/uL (ref 0.1–1.0)
Monocytes Relative: 13 %
Neutro Abs: 3.4 10*3/uL (ref 1.7–7.7)
Neutrophils Relative %: 69 %
Platelet Count: 215 10*3/uL (ref 150–400)
RBC: 3.97 MIL/uL (ref 3.87–5.11)
RDW: 13.5 % (ref 11.5–15.5)
WBC Count: 4.9 10*3/uL (ref 4.0–10.5)
nRBC: 0 % (ref 0.0–0.2)

## 2022-10-12 LAB — CMP (CANCER CENTER ONLY)
ALT: 25 U/L (ref 0–44)
AST: 21 U/L (ref 15–41)
Albumin: 4.2 g/dL (ref 3.5–5.0)
Alkaline Phosphatase: 51 U/L (ref 38–126)
Anion gap: 10 (ref 5–15)
BUN: 23 mg/dL (ref 8–23)
CO2: 25 mmol/L (ref 22–32)
Calcium: 9.4 mg/dL (ref 8.9–10.3)
Chloride: 104 mmol/L (ref 98–111)
Creatinine: 0.74 mg/dL (ref 0.44–1.00)
GFR, Estimated: >60 mL/min (ref >60)
Glucose, Bld: 83 mg/dL (ref 70–99)
Potassium: 4.1 mmol/L (ref 3.5–5.1)
Sodium: 139 mmol/L (ref 135–145)
Total Bilirubin: 0.4 mg/dL (ref 0.3–1.2)
Total Protein: 6.8 g/dL (ref 6.5–8.1)

## 2022-10-12 MED ORDER — PALONOSETRON HCL INJECTION 0.25 MG/5ML
0.2500 mg | Freq: Once | INTRAVENOUS | Status: AC
  Administered 2022-10-12: 0.25 mg via INTRAVENOUS
  Filled 2022-10-12: qty 5

## 2022-10-12 MED ORDER — SODIUM CHLORIDE 0.9 % IV SOLN: Freq: Once | INTRAVENOUS | Status: AC

## 2022-10-12 MED ORDER — FAMOTIDINE IN NACL 20-0.9 MG/50ML-% IV SOLN
20.0000 mg | Freq: Once | INTRAVENOUS | Status: AC
  Administered 2022-10-12: 20 mg via INTRAVENOUS
  Filled 2022-10-12: qty 50

## 2022-10-12 MED ORDER — DIPHENHYDRAMINE HCL 50 MG/ML IJ SOLN
25.0000 mg | Freq: Once | INTRAMUSCULAR | Status: AC
  Administered 2022-10-12: 25 mg via INTRAVENOUS
  Filled 2022-10-12: qty 1

## 2022-10-12 MED ORDER — SODIUM CHLORIDE 0.9 % IV SOLN
161.2000 mg | Freq: Once | INTRAVENOUS | Status: AC
  Administered 2022-10-12: 150 mg via INTRAVENOUS
  Filled 2022-10-12: qty 15

## 2022-10-12 MED ORDER — HEPARIN SOD (PORK) LOCK FLUSH 100 UNIT/ML IV SOLN
500.0000 [IU] | Freq: Once | INTRAVENOUS | Status: AC | PRN
  Administered 2022-10-12: 500 [IU]

## 2022-10-12 MED ORDER — SODIUM CHLORIDE 0.9% FLUSH
10.0000 mL | INTRAVENOUS | Status: DC | PRN
  Administered 2022-10-12: 10 mL

## 2022-10-12 MED ORDER — SODIUM CHLORIDE 0.9 % IV SOLN
10.0000 mg | Freq: Once | INTRAVENOUS | Status: AC
  Administered 2022-10-12: 10 mg via INTRAVENOUS
  Filled 2022-10-12: qty 10

## 2022-10-12 NOTE — Progress Notes (Signed)
Patient seen by Dr. Sherrill today ? ?Vitals are within treatment parameters. ? ?Labs reviewed by Dr. Sherrill and are within treatment parameters. ? ?Per physician team, patient is ready for treatment and there are NO modifications to the treatment plan.  ?

## 2022-10-12 NOTE — Progress Notes (Signed)
[facility] OFFICE PROGRESS NOTE   Diagnosis: Ovarian cancer  INTERVAL HISTORY:   [name] [name] returns as scheduled.  She completed another cycle of carboplatin on 09/28/2022.  No nausea/vomiting.  She has mild right lower abdomen discomfort when she has a bowel movement.  Mild intermittent numbness in the hands.  Objective:  Vital signs in last 24 hours:  Blood pressure 124/75, pulse 63, temperature 98.1 F (36.7 C), temperature source Oral, resp. rate 18, height '5\' 1"'$  (1.549 m), weight 154 lb 9.6 oz (70.1 kg), SpO2 99 %.    HEENT: No thrush or ulcers Resp: Lungs clear bilaterally Cardio: Regular rate and rhythm GI: No hepatosplenomegaly, no mass, mild tenderness in the right lower abdomen, no apparent ascites Vascular: No leg edema   Portacath/PICC-without erythema  Lab Results:  Lab Results  Component Value Date   WBC 4.9 10/12/2022   HGB 13.0 10/12/2022   HCT 39.9 10/12/2022   MCV 100.5 (H) 10/12/2022   PLT 215 10/12/2022   NEUTROABS 3.4 10/12/2022    CMP  Lab Results  Component Value Date   NA 139 10/12/2022   K 4.1 10/12/2022   CL 104 10/12/2022   CO2 25 10/12/2022   GLUCOSE 83 10/12/2022   BUN 23 10/12/2022   CREATININE 0.74 10/12/2022   CALCIUM 9.4 10/12/2022   PROT 6.8 10/12/2022   ALBUMIN 4.2 10/12/2022   AST 21 10/12/2022   ALT 25 10/12/2022   ALKPHOS 51 10/12/2022   BILITOT 0.4 10/12/2022   GFRNONAA >60 10/12/2022   GFRAA >60 03/13/2020    Lab Results  Component Value Date   CEA 0.8 07/13/2012   CA125 8 08/18/2016   Medications: I have reviewed the patient's current medications.   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 A 5 mm nodules remain on the right diaphragm. - TumorNext paired germline/tumor analyses: No somatic variants detected, germline CHEK2       VUS      Cycle 1 of adjuvant Taxol/carboplatin chemotherapy initiated on 09/19/2012.   The CA 125 normalized.   She completed day 15 of cycle 6 on 02/06/2013.   Restaging CT evaluation 03/29/2013 showed no evidence of metastatic disease in the chest. There was marked improvement in appearance/resolution of previous described peritoneal/omental disease. There was no convincing evidence of residual disease. There was minimal increased density in the region of the omentum favored to be treatment-related. There was no pelvic adenopathy. CA125 3.2 on 02/19/2014. 08/18/2016 CA-125 8 CT abdomen/pelvis 08/25/2016-solitary new enlarged right external iliac lymph node measuring 2.2 cm. Biopsy 09/07/2016-adenocarcinoma consistent with high-grade serous carcinoma. PET scan 09/22/2016-malignant range FDG uptake associated with the enlarged right external iliac lymph node compatible with metastatic adenopathy.  No additional sites of metastatic disease identified. Radiation 10/25/2016-12/01/2016 PET scan 03/28/2017-previously enlarged hypermetabolic right external iliac node-normal in size with resolution of hypermetabolic activity, no active malignancy identified PET scan 05/15/2018-no evidence of recurrent or metastatic disease, no hypermetabolic lymph nodes CT abdomen/pelvis 09/19/2019-mild increase in the size of several left upper quadrant peritoneal nodules measuring up to 11 mm.  No other sites of metastatic disease identified.  No ascites. CT abdomen/pelvis 03/14/2020-slight enlargement of several left upper quadrant peritoneal nodules, no ascites, no other evidence of disease progression CT abdomen/pelvis 09/12/2020 peritoneal nodularity/omental caking predominantly in the left upper/mid abdomen, mildly progressive.  Largest implant in the left upper abdomen adjacent to the stomach now  measures 17 mm, previously 13 mm.  Overall volume of peritoneal disease has mildly progressed. CT abdomen/pelvis  01/13/2021- 4 mm right ureteral calculus with moderate right hydronephrosis and upper right hydroureter, progressive omental nodularity, stable calcified and partially calcified right pelvic nodules CT abdomen/pelvis 05/13/2021-enlargement of left upper quadrant omental nodules and a peritoneal nodule at the right iliac fossa, no ascites CT abdomen/pelvis 08/14/2021-mild increase in size of omental nodules, no ascites, no new site of metastatic disease Taxol/carboplatin 09/09/2021, 09/15/2021, 09/22/2021 Taxol/carboplatin 10/07/2021, 10/13/2021, 10/20/2021 Taxol/Carboplatin 11/03/2021, 11/10/2021, 11/17/2021 CT 11/23/2021-decrease in peritoneal metastases, no new or progressive disease, airspace opacity at both lung bases Taxol/carboplatin 12/01/2021, 12/08/2021, 12/15/2021 Taxol/Carboplatin every 2 weeks beginning 12/29/2021 Carboplatin alone 01/26/2022, Taxol held due to neuropathy Carboplatin alone 02/09/2022, Taxol held due to neuropathy Carboplatin alone 02/23/2022, Taxol held due to neuropathy Carboplatin alone 03/09/2022, Taxol held due to neuropathy CTs 03/25/2022-no significant change in omental, mesenteric disease.  No new or clearly progressive findings.  Previously noted patchy areas of consolidative opacity at the lung bases have resolved. Treatment break beginning 03/26/2022 CT/pelvis 08/10/2022-increased size of peritoneal nodules, increased small volume ascites, mild right lower lobe pleural nodularity Cycle 1 salvage carboplatin 09/14/2022 (weekly x 3 followed by 1 week break) Cycle 2 salvage carboplatin 10/12/2022   2. Low abdomen/suprapubic pain prior to the exploratory laparotomy-likely secondary to omental/pelvic tumor; persistent mild pain in the lower abdomen   3. Chronic neck and back pain.   4. Anxiety -persistent despite Lexapro and Xanax. She has been evaluated by psychiatry. Currently on Lexapro. 5. Status post Port-A-Cath placement 09/22/2012. The Port-A-Cath was removed on 04/03/2013.   6.  Neutropenia secondary to chemotherapy- day 15 cycle 1 and cycle 3. Taxol/carboplatin not given secondary to neutropenia.    7. Herpes zoster involving a right thoracic dermatome July 2015. She completed a course of Valtrex. 8. Nodular bony prominence at the left pelvis on rectal exam 06/05/2015-likely a benign finding 9.  Right ureter stone/hydroureteronephrosis on CT 01/13/2021-referred to urology; status post right ureteroscopy with stone extraction and ureteral stent placement.  Stent removal 03/11/2021 10.  Taxol neuropathy      Disposition: Ms. Klinker appears stable.  She is tolerated the carboplatin well.  We will follow-up on the CA125 from today.  She will begin another cycle of carboplatin today.  She will return for an office visit in 4 weeks.  We will plan for a restaging CT after cycle 3.  [name], MD  10/12/2022  8:43 AM

## 2022-10-12 NOTE — Patient Instructions (Signed)

## 2022-10-12 NOTE — Patient Instructions (Signed)
[facility]  Discharge Instructions: Thank you for choosing [facility] to provide your oncology and hematology care.   If you have a lab appointment with the [facility], please go directly to the [facility] and check in at the registration area.   Wear comfortable clothing and clothing appropriate for easy access to any Portacath or PICC line.   We strive to give you quality time with your provider. You may need to reschedule your appointment if you arrive late (15 or more minutes).  Arriving late affects you and other patients whose appointments are after yours.  Also, if you miss three or more appointments without notifying the office, you may be dismissed from the clinic at the provider's discretion.      For prescription refill requests, have your pharmacy contact our office and allow 72 hours for refills to be completed.    Today you received the following chemotherapy and/or immunotherapy agents Carboplatin      To help prevent nausea and vomiting after your treatment, we encourage you to take your nausea medication as directed.  BELOW ARE SYMPTOMS THAT SHOULD BE REPORTED IMMEDIATELY: *FEVER GREATER THAN 100.4 F (38 C) OR HIGHER *CHILLS OR SWEATING *NAUSEA AND VOMITING THAT IS NOT CONTROLLED WITH YOUR NAUSEA MEDICATION *UNUSUAL SHORTNESS OF BREATH *UNUSUAL BRUISING OR BLEEDING *URINARY PROBLEMS (pain or burning when urinating, or frequent urination) *BOWEL PROBLEMS (unusual diarrhea, constipation, pain near the anus) TENDERNESS IN MOUTH AND THROAT WITH OR WITHOUT PRESENCE OF ULCERS (sore throat, sores in mouth, or a toothache) UNUSUAL RASH, SWELLING OR PAIN  UNUSUAL VAGINAL DISCHARGE OR ITCHING   Items with * indicate a potential emergency and should be followed up as soon as possible or go to the Emergency Department if any problems should occur.  Please show the CHEMOTHERAPY ALERT CARD or IMMUNOTHERAPY ALERT CARD at check-in to  the Emergency Department and triage nurse.  Should you have questions after your visit or need to cancel or reschedule your appointment, please contact [facility]  Dept: [phone]  and follow the prompts.  Office hours are 8:00 a.m. to 4:30 p.m. Monday - Friday. Please note that voicemails left after 4:00 p.m. may not be returned until the following business day.  We are closed weekends and major holidays. You have access to a nurse at all times for urgent questions. Please call the main number to the clinic Dept: [phone] and follow the prompts.   For any non-urgent questions, you may also contact your provider using MyChart. We now offer e-Visits for anyone 75 and older to request care online for non-urgent symptoms. For details visit [email or web address].   Also download the MyChart app! Go to the app store, search "MyChart", open the app, select [facility], and log in with your MyChart username and password.  Masks are optional in the cancer centers. If you would like for your care team to wear a mask while they are taking care of you, please let them know. You may have one support person who is at least 73 years old accompany you for your appointments.

## 2022-10-13 LAB — CA 125: Cancer Antigen (CA) 125: 70.8 U/mL — ABNORMAL HIGH (ref 0.0–38.1)

## 2022-10-19 ENCOUNTER — Inpatient Hospital Stay: Payer: Medicare Other

## 2022-10-19 VITALS — BP 114/68 | HR 75 | Temp 97.1°F | Resp 17 | Ht 61.0 in | Wt 153.8 lb

## 2022-10-19 DIAGNOSIS — Z5111 Encounter for antineoplastic chemotherapy: Secondary | ICD-10-CM | POA: Diagnosis not present

## 2022-10-19 DIAGNOSIS — C569 Malignant neoplasm of unspecified ovary: Secondary | ICD-10-CM

## 2022-10-19 DIAGNOSIS — D701 Agranulocytosis secondary to cancer chemotherapy: Secondary | ICD-10-CM | POA: Diagnosis not present

## 2022-10-19 DIAGNOSIS — G620 Drug-induced polyneuropathy: Secondary | ICD-10-CM | POA: Diagnosis not present

## 2022-10-19 DIAGNOSIS — C786 Secondary malignant neoplasm of retroperitoneum and peritoneum: Secondary | ICD-10-CM | POA: Diagnosis not present

## 2022-10-19 DIAGNOSIS — Z452 Encounter for adjustment and management of vascular access device: Secondary | ICD-10-CM | POA: Diagnosis not present

## 2022-10-19 LAB — CMP (CANCER CENTER ONLY)
ALT: 22 U/L (ref 0–44)
AST: 18 U/L (ref 15–41)
Albumin: 4.1 g/dL (ref 3.5–5.0)
Alkaline Phosphatase: 56 U/L (ref 38–126)
Anion gap: 9 (ref 5–15)
BUN: 25 mg/dL — ABNORMAL HIGH (ref 8–23)
CO2: 26 mmol/L (ref 22–32)
Calcium: 9.3 mg/dL (ref 8.9–10.3)
Chloride: 104 mmol/L (ref 98–111)
Creatinine: 0.69 mg/dL (ref 0.44–1.00)
GFR, Estimated: >60 mL/min (ref >60)
Glucose, Bld: 106 mg/dL — ABNORMAL HIGH (ref 70–99)
Potassium: 4.4 mmol/L (ref 3.5–5.1)
Sodium: 139 mmol/L (ref 135–145)
Total Bilirubin: 0.5 mg/dL (ref 0.3–1.2)
Total Protein: 6.9 g/dL (ref 6.5–8.1)

## 2022-10-19 LAB — CBC WITH DIFFERENTIAL (CANCER CENTER ONLY)
Abs Immature Granulocytes: 0.01 10*3/uL (ref 0.00–0.07)
Basophils Absolute: 0 10*3/uL (ref 0.0–0.1)
Basophils Relative: 1 %
Eosinophils Absolute: 0.1 10*3/uL (ref 0.0–0.5)
Eosinophils Relative: 1 %
HCT: 41.6 % (ref 36.0–46.0)
Hemoglobin: 13.5 g/dL (ref 12.0–15.0)
Immature Granulocytes: 0 %
Lymphocytes Relative: 18 %
Lymphs Abs: 0.9 10*3/uL (ref 0.7–4.0)
MCH: 32.6 pg (ref 26.0–34.0)
MCHC: 32.5 g/dL (ref 30.0–36.0)
MCV: 100.5 fL — ABNORMAL HIGH (ref 80.0–100.0)
Monocytes Absolute: 0.6 10*3/uL (ref 0.1–1.0)
Monocytes Relative: 13 %
Neutro Abs: 3.2 10*3/uL (ref 1.7–7.7)
Neutrophils Relative %: 67 %
Platelet Count: 200 10*3/uL (ref 150–400)
RBC: 4.14 MIL/uL (ref 3.87–5.11)
RDW: 13.7 % (ref 11.5–15.5)
WBC Count: 4.8 10*3/uL (ref 4.0–10.5)
nRBC: 0 % (ref 0.0–0.2)

## 2022-10-19 MED ORDER — ALTEPLASE 2 MG IJ SOLR
2.0000 mg | Freq: Once | INTRAMUSCULAR | Status: AC
  Administered 2022-10-19: 2 mg
  Filled 2022-10-19: qty 2

## 2022-10-19 MED ORDER — DIPHENHYDRAMINE HCL 50 MG/ML IJ SOLN
25.0000 mg | Freq: Once | INTRAMUSCULAR | Status: AC
  Administered 2022-10-19: 25 mg via INTRAVENOUS
  Filled 2022-10-19: qty 1

## 2022-10-19 MED ORDER — PALONOSETRON HCL INJECTION 0.25 MG/5ML
0.2500 mg | Freq: Once | INTRAVENOUS | Status: AC
  Administered 2022-10-19: 0.25 mg via INTRAVENOUS
  Filled 2022-10-19: qty 5

## 2022-10-19 MED ORDER — FAMOTIDINE IN NACL 20-0.9 MG/50ML-% IV SOLN
20.0000 mg | Freq: Once | INTRAVENOUS | Status: AC
  Administered 2022-10-19: 20 mg via INTRAVENOUS
  Filled 2022-10-19: qty 50

## 2022-10-19 MED ORDER — SODIUM CHLORIDE 0.9 % IV SOLN
10.0000 mg | Freq: Once | INTRAVENOUS | Status: AC
  Administered 2022-10-19: 10 mg via INTRAVENOUS
  Filled 2022-10-19: qty 10

## 2022-10-19 MED ORDER — SODIUM CHLORIDE 0.9% FLUSH
10.0000 mL | INTRAVENOUS | Status: DC | PRN
  Administered 2022-10-19: 10 mL

## 2022-10-19 MED ORDER — SODIUM CHLORIDE 0.9 % IV SOLN: Freq: Once | INTRAVENOUS | Status: AC

## 2022-10-19 MED ORDER — HEPARIN SOD (PORK) LOCK FLUSH 100 UNIT/ML IV SOLN
500.0000 [IU] | Freq: Once | INTRAVENOUS | Status: AC | PRN
  Administered 2022-10-19: 500 [IU]

## 2022-10-19 MED ORDER — SODIUM CHLORIDE 0.9 % IV SOLN
161.2000 mg | Freq: Once | INTRAVENOUS | Status: AC
  Administered 2022-10-19: 150 mg via INTRAVENOUS
  Filled 2022-10-19: qty 15

## 2022-10-19 NOTE — Progress Notes (Signed)
Per Ned Card, NP, ok to proceed with second dose of cathflo.  If unsuccessful, patient will be referred to have a dye study completed before her next appointment.   Blood return was obtained after second dose of cath flo.  Port was deaccessed per protocol and patient was discharged home.

## 2022-10-19 NOTE — Patient Instructions (Signed)
[facility]   Discharge Instructions: Thank you for choosing [facility] to provide your oncology and hematology care.   If you have a lab appointment with the [facility], please go directly to the [facility] and check in at the registration area.   Wear comfortable clothing and clothing appropriate for easy access to any Portacath or PICC line.   We strive to give you quality time with your provider. You may need to reschedule your appointment if you arrive late (15 or more minutes).  Arriving late affects you and other patients whose appointments are after yours.  Also, if you miss three or more appointments without notifying the office, you may be dismissed from the clinic at the provider's discretion.      For prescription refill requests, have your pharmacy contact our office and allow 72 hours for refills to be completed.    Today you received the following chemotherapy and/or immunotherapy agents Carboplatin.      To help prevent nausea and vomiting after your treatment, we encourage you to take your nausea medication as directed.  BELOW ARE SYMPTOMS THAT SHOULD BE REPORTED IMMEDIATELY: *FEVER GREATER THAN 100.4 F (38 C) OR HIGHER *CHILLS OR SWEATING *NAUSEA AND VOMITING THAT IS NOT CONTROLLED WITH YOUR NAUSEA MEDICATION *UNUSUAL SHORTNESS OF BREATH *UNUSUAL BRUISING OR BLEEDING *URINARY PROBLEMS (pain or burning when urinating, or frequent urination) *BOWEL PROBLEMS (unusual diarrhea, constipation, pain near the anus) TENDERNESS IN MOUTH AND THROAT WITH OR WITHOUT PRESENCE OF ULCERS (sore throat, sores in mouth, or a toothache) UNUSUAL RASH, SWELLING OR PAIN  UNUSUAL VAGINAL DISCHARGE OR ITCHING   Items with * indicate a potential emergency and should be followed up as soon as possible or go to the Emergency Department if any problems should occur.  Please show the CHEMOTHERAPY ALERT CARD or IMMUNOTHERAPY ALERT CARD at check-in to  the Emergency Department and triage nurse.  Should you have questions after your visit or need to cancel or reschedule your appointment, please contact [facility]  Dept: [phone]  and follow the prompts.  Office hours are 8:00 a.m. to 4:30 p.m. Monday - Friday. Please note that voicemails left after 4:00 p.m. may not be returned until the following business day.  We are closed weekends and major holidays. You have access to a nurse at all times for urgent questions. Please call the main number to the clinic Dept: [phone] and follow the prompts.   For any non-urgent questions, you may also contact your provider using MyChart. We now offer e-Visits for anyone 58 and older to request care online for non-urgent symptoms. For details visit [email or web address].   Also download the MyChart app! Go to the app store, search "MyChart", open the app, select [facility], and log in with your MyChart username and password.  Carboplatin Injection What is this medication? CARBOPLATIN (KAR boe pla tin) treats some types of cancer. It works by slowing down the growth of cancer cells. This medicine may be used for other purposes; ask your health care provider or pharmacist if you have questions. COMMON BRAND NAME(S): Paraplatin What should I tell my care team before I take this medication? They need to know if you have any of these conditions: Blood disorders Hearing problems Kidney disease Recent or ongoing radiation therapy An unusual or allergic reaction to carboplatin, cisplatin, other medications, foods, dyes, or preservatives Pregnant or trying to get pregnant Breast-feeding How should I use this  medication? This medication is injected into a vein. It is given by your care team in a hospital or clinic setting. Talk to your care team about the use of this medication in children. Special care may be needed. Overdosage: If you think you have taken too much of this  medicine contact a poison control center or emergency room at once. NOTE: This medicine is only for you. Do not share this medicine with others. What if I miss a dose? Keep appointments for follow-up doses. It is important not to miss your dose. Call your care team if you are unable to keep an appointment. What may interact with this medication? Medications for seizures Some antibiotics, such as amikacin, gentamicin, neomycin, streptomycin, tobramycin Vaccines This list may not describe all possible interactions. Give your health care provider a list of all the medicines, herbs, non-prescription drugs, or dietary supplements you use. Also tell them if you smoke, drink alcohol, or use illegal drugs. Some items may interact with your medicine. What should I watch for while using this medication? Your condition will be monitored carefully while you are receiving this medication. You may need blood work while taking this medication. This medication may make you feel generally unwell. This is not uncommon, as chemotherapy can affect healthy cells as well as cancer cells. Report any side effects. Continue your course of treatment even though you feel ill unless your care team tells you to stop. In some cases, you may be given additional medications to help with side effects. Follow all directions for their use. This medication may increase your risk of getting an infection. Call your care team for advice if you get a fever, chills, sore throat, or other symptoms of a cold or flu. Do not treat yourself. Try to avoid being around people who are sick. Avoid taking medications that contain aspirin, acetaminophen, ibuprofen, naproxen, or ketoprofen unless instructed by your care team. These medications may hide a fever. Be careful brushing or flossing your teeth or using a toothpick because you may get an infection or bleed more easily. If you have any dental work done, tell your dentist you are receiving this  medication. Talk to your care team if you wish to become pregnant or think you might be pregnant. This medication can cause serious birth defects. Talk to your care team about effective forms of contraception. Do not breast-feed while taking this medication. What side effects may I notice from receiving this medication? Side effects that you should report to your care team as soon as possible: Allergic reactions--skin rash, itching, hives, swelling of the face, lips, tongue, or throat Infection--fever, chills, cough, sore throat, wounds that don't heal, pain or trouble when passing urine, general feeling of discomfort or being unwell Low red blood cell level--unusual weakness or fatigue, dizziness, headache, trouble breathing Pain, tingling, or numbness in the hands or feet, muscle weakness, change in vision, confusion or trouble speaking, loss of balance or coordination, trouble walking, seizures Unusual bruising or bleeding Side effects that usually do not require medical attention (report to your care team if they continue or are bothersome): Hair loss Nausea Unusual weakness or fatigue Vomiting This list may not describe all possible side effects. Call your doctor for medical advice about side effects. You may report side effects to FDA at 1-800-FDA-1088. Where should I keep my medication? This medication is given in a hospital or clinic. It will not be stored at home. NOTE: This sheet is a summary. It may not  cover all possible information. If you have questions about this medicine, talk to your doctor, pharmacist, or health care provider.  2023 Elsevier/Gold Standard (2022-01-04 00:00:00)

## 2022-10-26 ENCOUNTER — Inpatient Hospital Stay: Payer: Medicare Other

## 2022-10-26 VITALS — BP 135/75 | HR 66 | Temp 97.3°F | Resp 18 | Ht 61.0 in | Wt 154.2 lb

## 2022-10-26 DIAGNOSIS — C569 Malignant neoplasm of unspecified ovary: Secondary | ICD-10-CM

## 2022-10-26 DIAGNOSIS — C786 Secondary malignant neoplasm of retroperitoneum and peritoneum: Secondary | ICD-10-CM | POA: Diagnosis not present

## 2022-10-26 DIAGNOSIS — G620 Drug-induced polyneuropathy: Secondary | ICD-10-CM | POA: Diagnosis not present

## 2022-10-26 DIAGNOSIS — Z5111 Encounter for antineoplastic chemotherapy: Secondary | ICD-10-CM | POA: Diagnosis not present

## 2022-10-26 DIAGNOSIS — Z452 Encounter for adjustment and management of vascular access device: Secondary | ICD-10-CM | POA: Diagnosis not present

## 2022-10-26 DIAGNOSIS — D701 Agranulocytosis secondary to cancer chemotherapy: Secondary | ICD-10-CM | POA: Diagnosis not present

## 2022-10-26 LAB — CBC WITH DIFFERENTIAL (CANCER CENTER ONLY)
Abs Immature Granulocytes: 0.01 10*3/uL (ref 0.00–0.07)
Basophils Absolute: 0 10*3/uL (ref 0.0–0.1)
Basophils Relative: 0 %
Eosinophils Absolute: 0.1 10*3/uL (ref 0.0–0.5)
Eosinophils Relative: 2 %
HCT: 39 % (ref 36.0–46.0)
Hemoglobin: 12.7 g/dL (ref 12.0–15.0)
Immature Granulocytes: 0 %
Lymphocytes Relative: 16 %
Lymphs Abs: 0.8 10*3/uL (ref 0.7–4.0)
MCH: 32.8 pg (ref 26.0–34.0)
MCHC: 32.6 g/dL (ref 30.0–36.0)
MCV: 100.8 fL — ABNORMAL HIGH (ref 80.0–100.0)
Monocytes Absolute: 0.6 10*3/uL (ref 0.1–1.0)
Monocytes Relative: 12 %
Neutro Abs: 3.3 10*3/uL (ref 1.7–7.7)
Neutrophils Relative %: 70 %
Platelet Count: 215 10*3/uL (ref 150–400)
RBC: 3.87 MIL/uL (ref 3.87–5.11)
RDW: 13.8 % (ref 11.5–15.5)
WBC Count: 4.8 10*3/uL (ref 4.0–10.5)
nRBC: 0 % (ref 0.0–0.2)

## 2022-10-26 LAB — CMP (CANCER CENTER ONLY)
ALT: 20 U/L (ref 0–44)
AST: 17 U/L (ref 15–41)
Albumin: 4 g/dL (ref 3.5–5.0)
Alkaline Phosphatase: 46 U/L (ref 38–126)
Anion gap: 7 (ref 5–15)
BUN: 23 mg/dL (ref 8–23)
CO2: 28 mmol/L (ref 22–32)
Calcium: 9.2 mg/dL (ref 8.9–10.3)
Chloride: 103 mmol/L (ref 98–111)
Creatinine: 0.61 mg/dL (ref 0.44–1.00)
GFR, Estimated: >60 mL/min (ref >60)
Glucose, Bld: 86 mg/dL (ref 70–99)
Potassium: 4.1 mmol/L (ref 3.5–5.1)
Sodium: 138 mmol/L (ref 135–145)
Total Bilirubin: 0.5 mg/dL (ref 0.3–1.2)
Total Protein: 6.7 g/dL (ref 6.5–8.1)

## 2022-10-26 MED ORDER — PALONOSETRON HCL INJECTION 0.25 MG/5ML
0.2500 mg | Freq: Once | INTRAVENOUS | Status: AC
  Administered 2022-10-26: 0.25 mg via INTRAVENOUS
  Filled 2022-10-26: qty 5

## 2022-10-26 MED ORDER — SODIUM CHLORIDE 0.9% FLUSH
10.0000 mL | INTRAVENOUS | Status: DC | PRN
  Administered 2022-10-26: 10 mL

## 2022-10-26 MED ORDER — FAMOTIDINE IN NACL 20-0.9 MG/50ML-% IV SOLN
20.0000 mg | Freq: Once | INTRAVENOUS | Status: AC
  Administered 2022-10-26: 20 mg via INTRAVENOUS
  Filled 2022-10-26: qty 50

## 2022-10-26 MED ORDER — DIPHENHYDRAMINE HCL 50 MG/ML IJ SOLN
25.0000 mg | Freq: Once | INTRAMUSCULAR | Status: AC
  Administered 2022-10-26: 25 mg via INTRAVENOUS
  Filled 2022-10-26: qty 1

## 2022-10-26 MED ORDER — SODIUM CHLORIDE 0.9 % IV SOLN: Freq: Once | INTRAVENOUS | Status: AC

## 2022-10-26 MED ORDER — SODIUM CHLORIDE 0.9 % IV SOLN
161.2000 mg | Freq: Once | INTRAVENOUS | Status: AC
  Administered 2022-10-26: 150 mg via INTRAVENOUS
  Filled 2022-10-26: qty 15

## 2022-10-26 MED ORDER — SODIUM CHLORIDE 0.9 % IV SOLN
10.0000 mg | Freq: Once | INTRAVENOUS | Status: AC
  Administered 2022-10-26: 10 mg via INTRAVENOUS
  Filled 2022-10-26: qty 10

## 2022-10-26 MED ORDER — HEPARIN SOD (PORK) LOCK FLUSH 100 UNIT/ML IV SOLN
500.0000 [IU] | Freq: Once | INTRAVENOUS | Status: AC | PRN
  Administered 2022-10-26: 500 [IU]

## 2022-10-26 NOTE — Patient Instructions (Signed)
[facility]   Discharge Instructions: Thank you for choosing [facility] to provide your oncology and hematology care.   If you have a lab appointment with the Cancer Center, please go directly to the Cancer Center and check in at the registration area.   Wear comfortable clothing and clothing appropriate for easy access to any Portacath or PICC line.   We strive to give you quality time with your provider. You may need to reschedule your appointment if you arrive late (15 or more minutes).  Arriving late affects you and other patients whose appointments are after yours.  Also, if you miss three or more appointments without notifying the office, you may be dismissed from the clinic at the provider's discretion.      For prescription refill requests, have your pharmacy contact our office and allow 72 hours for refills to be completed.    Today you received the following chemotherapy and/or immunotherapy agents Carboplatin.      To help prevent nausea and vomiting after your treatment, we encourage you to take your nausea medication as directed.  BELOW ARE SYMPTOMS THAT SHOULD BE REPORTED IMMEDIATELY: *FEVER GREATER THAN 100.4 F (38 C) OR HIGHER *CHILLS OR SWEATING *NAUSEA AND VOMITING THAT IS NOT CONTROLLED WITH YOUR NAUSEA MEDICATION *UNUSUAL SHORTNESS OF BREATH *UNUSUAL BRUISING OR BLEEDING *URINARY PROBLEMS (pain or burning when urinating, or frequent urination) *BOWEL PROBLEMS (unusual diarrhea, constipation, pain near the anus) TENDERNESS IN MOUTH AND THROAT WITH OR WITHOUT PRESENCE OF ULCERS (sore throat, sores in mouth, or a toothache) UNUSUAL RASH, SWELLING OR PAIN  UNUSUAL VAGINAL DISCHARGE OR ITCHING   Items with * indicate a potential emergency and should be followed up as soon as possible or go to the Emergency Department if any problems should occur.  Please show the CHEMOTHERAPY ALERT CARD or IMMUNOTHERAPY ALERT CARD at  check-in to the Emergency Department and triage nurse.  Should you have questions after your visit or need to cancel or reschedule your appointment, please contact [facility]  Dept: [phone]  and follow the prompts.  Office hours are 8:00 a.m. to 4:30 p.m. Monday - Friday. Please note that voicemails left after 4:00 p.m. may not be returned until the following business day.  We are closed weekends and major holidays. You have access to a nurse at all times for urgent questions. Please call the main number to the clinic Dept: [phone] and follow the prompts.   For any non-urgent questions, you may also contact your provider using MyChart. We now offer e-Visits for anyone 18 and older to request care online for non-urgent symptoms. For details visit [email or web address].   Also download the MyChart app! Go to the app store, search "MyChart", open the app, select , and log in with your MyChart username and password.  Carboplatin Injection What is this medication? CARBOPLATIN (KAR boe pla tin) treats some types of cancer. It works by slowing down the growth of cancer cells. This medicine may be used for other purposes; ask your health care provider or pharmacist if you have questions. COMMON BRAND NAME(S): Paraplatin What should I tell my care team before I take this medication? They need to know if you have any of these conditions: Blood disorders Hearing problems Kidney disease Recent or ongoing radiation therapy An unusual or allergic reaction to carboplatin, cisplatin, other medications, foods, dyes, or preservatives Pregnant or trying to get pregnant Breast-feeding How should I   use this medication? This medication is injected into a vein. It is given by your care team in a hospital or clinic setting. Talk to your care team about the use of this medication in children. Special care may be needed. Overdosage: If you think you have  taken too much of this medicine contact a poison control center or emergency room at once. NOTE: This medicine is only for you. Do not share this medicine with others. What if I miss a dose? Keep appointments for follow-up doses. It is important not to miss your dose. Call your care team if you are unable to keep an appointment. What may interact with this medication? Medications for seizures Some antibiotics, such as amikacin, gentamicin, neomycin, streptomycin, tobramycin Vaccines This list may not describe all possible interactions. Give your health care provider a list of all the medicines, herbs, non-prescription drugs, or dietary supplements you use. Also tell them if you smoke, drink alcohol, or use illegal drugs. Some items may interact with your medicine. What should I watch for while using this medication? Your condition will be monitored carefully while you are receiving this medication. You may need blood work while taking this medication. This medication may make you feel generally unwell. This is not uncommon, as chemotherapy can affect healthy cells as well as cancer cells. Report any side effects. Continue your course of treatment even though you feel ill unless your care team tells you to stop. In some cases, you may be given additional medications to help with side effects. Follow all directions for their use. This medication may increase your risk of getting an infection. Call your care team for advice if you get a fever, chills, sore throat, or other symptoms of a cold or flu. Do not treat yourself. Try to avoid being around people who are sick. Avoid taking medications that contain aspirin, acetaminophen, ibuprofen, naproxen, or ketoprofen unless instructed by your care team. These medications may hide a fever. Be careful brushing or flossing your teeth or using a toothpick because you may get an infection or bleed more easily. If you have any dental work done, tell your dentist  you are receiving this medication. Talk to your care team if you wish to become pregnant or think you might be pregnant. This medication can cause serious birth defects. Talk to your care team about effective forms of contraception. Do not breast-feed while taking this medication. What side effects may I notice from receiving this medication? Side effects that you should report to your care team as soon as possible: Allergic reactions--skin rash, itching, hives, swelling of the face, lips, tongue, or throat Infection--fever, chills, cough, sore throat, wounds that don't heal, pain or trouble when passing urine, general feeling of discomfort or being unwell Low red blood cell level--unusual weakness or fatigue, dizziness, headache, trouble breathing Pain, tingling, or numbness in the hands or feet, muscle weakness, change in vision, confusion or trouble speaking, loss of balance or coordination, trouble walking, seizures Unusual bruising or bleeding Side effects that usually do not require medical attention (report to your care team if they continue or are bothersome): Hair loss Nausea Unusual weakness or fatigue Vomiting This list may not describe all possible side effects. Call your doctor for medical advice about side effects. You may report side effects to FDA at 1-800-FDA-1088. Where should I keep my medication? This medication is given in a hospital or clinic. It will not be stored at home. NOTE: This sheet is a summary. It   may not cover all possible information. If you have questions about this medicine, talk to your doctor, pharmacist, or health care provider.  2023 Elsevier/Gold Standard (2022-01-04 00:00:00)   

## 2022-10-26 NOTE — Patient Instructions (Signed)

## 2022-11-02 ENCOUNTER — Ambulatory Visit: Payer: Self-pay | Admitting: *Deleted

## 2022-11-02 ENCOUNTER — Emergency Department: Payer: Medicare Other

## 2022-11-02 ENCOUNTER — Inpatient Hospital Stay
Admission: EM | Admit: 2022-11-02 | Discharge: 2022-11-04 | DRG: 522 | Disposition: A | Discharge status: Home Health | Payer: Medicare Other | Attending: Obstetrics and Gynecology | Admitting: Obstetrics and Gynecology

## 2022-11-02 ENCOUNTER — Other Ambulatory Visit: Payer: Self-pay

## 2022-11-02 DIAGNOSIS — R079 Chest pain, unspecified: Secondary | ICD-10-CM | POA: Diagnosis not present

## 2022-11-02 DIAGNOSIS — S72144A Nondisplaced intertrochanteric fracture of right femur, initial encounter for closed fracture: Secondary | ICD-10-CM

## 2022-11-02 DIAGNOSIS — M069 Rheumatoid arthritis, unspecified: Secondary | ICD-10-CM | POA: Diagnosis not present

## 2022-11-02 DIAGNOSIS — S72141A Displaced intertrochanteric fracture of right femur, initial encounter for closed fracture: Secondary | ICD-10-CM | POA: Diagnosis not present

## 2022-11-02 DIAGNOSIS — Z8262 Family history of osteoporosis: Secondary | ICD-10-CM | POA: Diagnosis not present

## 2022-11-02 DIAGNOSIS — Z87442 Personal history of urinary calculi: Secondary | ICD-10-CM

## 2022-11-02 DIAGNOSIS — Z888 Allergy status to other drugs, medicaments and biological substances status: Secondary | ICD-10-CM

## 2022-11-02 DIAGNOSIS — Z90710 Acquired absence of both cervix and uterus: Secondary | ICD-10-CM | POA: Diagnosis not present

## 2022-11-02 DIAGNOSIS — R42 Dizziness and giddiness: Secondary | ICD-10-CM | POA: Diagnosis present

## 2022-11-02 DIAGNOSIS — S72001A Fracture of unspecified part of neck of right femur, initial encounter for closed fracture: Principal | ICD-10-CM

## 2022-11-02 DIAGNOSIS — M80851A Other osteoporosis with current pathological fracture, right femur, initial encounter for fracture: Principal | ICD-10-CM | POA: Diagnosis present

## 2022-11-02 DIAGNOSIS — M1611 Unilateral primary osteoarthritis, right hip: Secondary | ICD-10-CM | POA: Diagnosis not present

## 2022-11-02 DIAGNOSIS — F32A Depression, unspecified: Secondary | ICD-10-CM | POA: Diagnosis not present

## 2022-11-02 DIAGNOSIS — Z79899 Other long term (current) drug therapy: Secondary | ICD-10-CM | POA: Diagnosis not present

## 2022-11-02 DIAGNOSIS — E7800 Pure hypercholesterolemia, unspecified: Secondary | ICD-10-CM | POA: Diagnosis present

## 2022-11-02 DIAGNOSIS — I341 Nonrheumatic mitral (valve) prolapse: Secondary | ICD-10-CM | POA: Diagnosis present

## 2022-11-02 DIAGNOSIS — Z90722 Acquired absence of ovaries, bilateral: Secondary | ICD-10-CM

## 2022-11-02 DIAGNOSIS — C569 Malignant neoplasm of unspecified ovary: Secondary | ICD-10-CM | POA: Diagnosis present

## 2022-11-02 DIAGNOSIS — S7290XA Unspecified fracture of unspecified femur, initial encounter for closed fracture: Secondary | ICD-10-CM | POA: Diagnosis not present

## 2022-11-02 DIAGNOSIS — Z8052 Family history of malignant neoplasm of bladder: Secondary | ICD-10-CM

## 2022-11-02 DIAGNOSIS — Z96641 Presence of right artificial hip joint: Secondary | ICD-10-CM | POA: Diagnosis not present

## 2022-11-02 DIAGNOSIS — Z9079 Acquired absence of other genital organ(s): Secondary | ICD-10-CM | POA: Diagnosis not present

## 2022-11-02 DIAGNOSIS — Y9301 Activity, walking, marching and hiking: Secondary | ICD-10-CM | POA: Diagnosis present

## 2022-11-02 DIAGNOSIS — Z923 Personal history of irradiation: Secondary | ICD-10-CM

## 2022-11-02 DIAGNOSIS — Z8249 Family history of ischemic heart disease and other diseases of the circulatory system: Secondary | ICD-10-CM | POA: Diagnosis not present

## 2022-11-02 DIAGNOSIS — Z807 Family history of other malignant neoplasms of lymphoid, hematopoietic and related tissues: Secondary | ICD-10-CM | POA: Diagnosis not present

## 2022-11-02 DIAGNOSIS — F419 Anxiety disorder, unspecified: Secondary | ICD-10-CM | POA: Diagnosis present

## 2022-11-02 DIAGNOSIS — E785 Hyperlipidemia, unspecified: Secondary | ICD-10-CM | POA: Diagnosis not present

## 2022-11-02 DIAGNOSIS — W010XXA Fall on same level from slipping, tripping and stumbling without subsequent striking against object, initial encounter: Secondary | ICD-10-CM | POA: Diagnosis not present

## 2022-11-02 DIAGNOSIS — R9431 Abnormal electrocardiogram [ECG] [EKG]: Secondary | ICD-10-CM | POA: Diagnosis not present

## 2022-11-02 DIAGNOSIS — Z471 Aftercare following joint replacement surgery: Secondary | ICD-10-CM | POA: Diagnosis not present

## 2022-11-02 DIAGNOSIS — M25551 Pain in right hip: Secondary | ICD-10-CM | POA: Diagnosis not present

## 2022-11-02 DIAGNOSIS — W19XXXA Unspecified fall, initial encounter: Secondary | ICD-10-CM | POA: Diagnosis not present

## 2022-11-02 LAB — BASIC METABOLIC PANEL
Anion gap: 7 (ref 5–15)
BUN: 22 mg/dL (ref 8–23)
CO2: 25 mmol/L (ref 22–32)
Calcium: 8.6 mg/dL — ABNORMAL LOW (ref 8.9–10.3)
Chloride: 103 mmol/L (ref 98–111)
Creatinine, Ser: 0.68 mg/dL (ref 0.44–1.00)
GFR, Estimated: >60 mL/min (ref >60)
Glucose, Bld: 92 mg/dL (ref 70–99)
Potassium: 3.9 mmol/L (ref 3.5–5.1)
Sodium: 135 mmol/L (ref 135–145)

## 2022-11-02 LAB — PROTIME-INR
INR: 1 (ref 0.8–1.2)
Prothrombin Time: 12.9 seconds (ref 11.4–15.2)

## 2022-11-02 LAB — CBC
HCT: 44.3 % (ref 36.0–46.0)
Hemoglobin: 14.4 g/dL (ref 12.0–15.0)
MCH: 32.8 pg (ref 26.0–34.0)
MCHC: 32.5 g/dL (ref 30.0–36.0)
MCV: 100.9 fL — ABNORMAL HIGH (ref 80.0–100.0)
Platelets: 246 10*3/uL (ref 150–400)
RBC: 4.39 MIL/uL (ref 3.87–5.11)
RDW: 14.1 % (ref 11.5–15.5)
WBC: 11.2 10*3/uL — ABNORMAL HIGH (ref 4.0–10.5)
nRBC: 0 % (ref 0.0–0.2)

## 2022-11-02 LAB — SURGICAL PCR SCREEN
MRSA, PCR: NEGATIVE
Staphylococcus aureus: NEGATIVE

## 2022-11-02 MED ORDER — ACETAMINOPHEN 325 MG PO TABS: 650.0000 mg | ORAL_TABLET | Freq: Four times a day (QID) | ORAL | Status: DC | PRN

## 2022-11-02 MED ORDER — HYDROCODONE-ACETAMINOPHEN 5-325 MG PO TABS
1.0000 | ORAL_TABLET | Freq: Four times a day (QID) | ORAL | Status: DC | PRN
  Administered 2022-11-02 – 2022-11-03 (×2): 1 via ORAL
  Filled 2022-11-02 (×2): qty 1

## 2022-11-02 MED ORDER — HEPARIN SODIUM (PORCINE) 5000 UNIT/ML IJ SOLN: 5000.0000 [IU] | Freq: Three times a day (TID) | INTRAMUSCULAR | Status: DC

## 2022-11-02 MED ORDER — POLYETHYLENE GLYCOL 3350 17 G PO PACK: 17.0000 g | PACK | Freq: Every day | ORAL | Status: DC | PRN

## 2022-11-02 MED ORDER — TRANEXAMIC ACID-NACL 1000-0.7 MG/100ML-% IV SOLN
1000.0000 mg | INTRAVENOUS | Status: AC
  Administered 2022-11-03: 1000 mg via INTRAVENOUS
  Filled 2022-11-02: qty 100

## 2022-11-02 MED ORDER — HEPARIN SODIUM (PORCINE) 5000 UNIT/ML IJ SOLN
5000.0000 [IU] | Freq: Three times a day (TID) | INTRAMUSCULAR | Status: AC
  Administered 2022-11-02: 5000 [IU] via SUBCUTANEOUS
  Filled 2022-11-02: qty 1

## 2022-11-02 MED ORDER — HYDROMORPHONE HCL 1 MG/ML IJ SOLN: 0.5000 mg | INTRAMUSCULAR | Status: DC | PRN

## 2022-11-02 MED ORDER — CEFAZOLIN SODIUM-DEXTROSE 2-4 GM/100ML-% IV SOLN
2.0000 g | INTRAVENOUS | Status: AC
  Administered 2022-11-03: 2 g via INTRAVENOUS

## 2022-11-02 NOTE — Assessment & Plan Note (Signed)
Patient has a history of ovarian cancer currently on carboplatin.  Last dose received on 10/26/2022.

## 2022-11-02 NOTE — Telephone Encounter (Signed)
  Chief Complaint: [place] Symptoms: right hip pain, cannot bear weight, cannot walk Frequency: 1 hour prior to call Pertinent Negatives: Patient denies  Disposition: '[x]'$ ED /'[]'$ Urgent Care (no appt availability in office) / '[]'$ Appointment(In office/virtual)/ '[]'$  [facility]/ '[]'$ Home Care/ '[]'$ Refused Recommended Disposition /'[]'$ [facility]/ '[]'$  Follow-up with PCP Additional Notes: Advised ED, offered EMS, declines states she has driver. Advised to not eat or drink anything, to not bear weight. Verbalizes understanding.  Community line call Reason for Disposition  Injury (or injuries) that need emergency care  Answer Assessment - Initial Assessment Questions 1. MECHANISM: "How did the fall happen?"     Tripped 2. DOMESTIC VIOLENCE AND ELDER ABUSE SCREENING: "Did you fall because someone pushed you or tried to hurt you?" If Yes, ask: "Are you safe now?"     No 3. ONSET: "When did the fall happen?" (e.g., minutes, hours, or days ago)     1 hour ago 4. LOCATION: "What part of the body hit the ground?" (e.g., back, buttocks, head, hips, knees, hands, head, stomach)     Right hip 5. INJURY: "Did you hurt (injure) yourself when you fell?" If Yes, ask: "What did you injure? Tell me more about this?" (e.g., body area; type of injury; pain severity)"     CAn't walk 6. PAIN: "Is there any pain?" If Yes, ask: "How bad is the pain?" (e.g., Scale 1-10; or mild,  moderate, severe)   - NONE (0): No pain   - MILD (1-3): Doesn't interfere with normal activities    - MODERATE (4-7): Interferes with normal activities or awakens from sleep    - SEVERE (8-10): Excruciating pain, unable to do any normal activities      *No Answer* 7. SIZE: For cuts, bruises, or swelling, ask: "How large is it?" (e.g., inches or centimeters)      *No Answer* 8. PREGNANCY: "Is there any chance you are pregnant?" "When was your last menstrual period?"     *No Answer* 9. OTHER SYMPTOMS: "Do you have any other  symptoms?" (e.g., dizziness, fever, weakness; new onset or worsening).      *No Answer* 10. CAUSE: "What do you think caused the fall (or falling)?" (e.g., tripped, dizzy spell)       *No Answer*  Protocols used: Falls and [facility]

## 2022-11-02 NOTE — ED Provider Notes (Signed)
[facility] Provider Note    Event Date/Time   First MD Initiated Contact with Patient 11/02/22 1124     (approximate)   History   Hip Pain   HPI  [name] [name] is a 72 y.o. female with past medical history significant for ovarian cancer on chemotherapy, who presents to the emergency department following a fall.  States that she was out in the yard and tripped and fell landing on her right hip.  Unable to ambulate following the fall.  No head injury or loss of consciousness.  Not on anticoagulation or any daily medications.  Only medical history is ovarian cancer.  Denies any preceding chest pain or shortness of breath.     Physical Exam   Triage Vital Signs: ED Triage Vitals  Enc Vitals Group     BP 11/02/22 1053 (!) 144/87     Pulse Rate 11/02/22 1053 84     Resp 11/02/22 1053 18     Temp 11/02/22 1053 98.4 F (36.9 C)     Temp Source 11/02/22 1053 Oral     SpO2 11/02/22 1053 96 %     Weight 11/02/22 1054 154 lb 1.6 oz (69.9 kg)     Height 11/02/22 1054 '5\' 1"'$  (1.549 m)     Head Circumference --      Peak Flow --      Pain Score 11/02/22 1053 10     Pain Loc --      Pain Edu? --      Excl. in [place]? --     Most recent vital signs: Vitals:   11/02/22 1053  BP: (!) 144/87  Pulse: 84  Resp: 18  Temp: 98.4 F (36.9 C)  SpO2: 96%    Physical Exam HENT:     Head: Atraumatic.     Right Ear: External ear normal.     Left Ear: External ear normal.  Eyes:     Extraocular Movements: Extraocular movements intact.     Pupils: Pupils are equal, round, and reactive to light.  Cardiovascular:     Rate and Rhythm: Normal rate.  Pulmonary:     Effort: No respiratory distress.  Abdominal:     General: There is no distension.  Musculoskeletal:        General: Tenderness present.     Cervical back: No tenderness.     Comments: Right hip internally rotated.  Able to move bilateral toes.  Full flexion and extension of the left hip.  No tenderness  to bilateral upper extremities.  +2 DP pulses bilaterally.  Sensation intact to bilateral lower extremities.  Neurological:     General: No focal deficit present.     Mental Status: She is alert.     IMPRESSION / MDM / ASSESSMENT AND PLAN / ED COURSE  I reviewed the triage vital signs and the nursing notes.  Differential diagnosis including hip fracture, dislocation, musculoskeletal strain, pathologic fracture   EKG  I, Nathaniel Man, the attending physician, personally viewed and interpreted this ECG.   Rate: Normal  Rhythm: Normal sinus  Axis: Normal  Intervals: Normal  ST&T Change: None  No tachycardic or bradycardic dysrhythmias while on cardiac telemetry.  RADIOLOGY I independently reviewed imaging, my interpretation of imaging: X-ray ray of the right hip with acute femoral neck fracture without significant dislocation or angulation.  Read as acute femoral neck fracture.  LABS (all labs ordered are listed, but only abnormal results are displayed) Labs interpreted as -  Labs Reviewed  CBC  BASIC METABOLIC PANEL  PROTIME-INR    TREATMENT  Offered pain medication however patient declined at this time.  MDM  Consulted and discussed the patient's case with orthopedic surgery's nurse because the orthopedic physician was currently scrubbed in give the MRN with recommendations to admit to the hospitalist.     PROCEDURES:  Critical Care performed: No  Procedures  Patient's presentation is most consistent with acute presentation with potential threat to life or bodily function.   MEDICATIONS ORDERED IN ED: Medications - No data to display  FINAL CLINICAL IMPRESSION(S) / ED DIAGNOSES   Final diagnoses:  Closed right hip fracture, initial encounter [facility])     Rx / DC Orders   ED Discharge Orders     None        Note:  This document was prepared using Dragon voice recognition software and may include unintentional dictation errors.   Nathaniel Man, MD 11/02/22 1155

## 2022-11-02 NOTE — Consult Note (Signed)
ORTHOPAEDIC CONSULTATION  REQUESTING PHYSICIAN: Wouk, Ailene Rud, MD  Chief Complaint: right hip pain  HPI: [name] [name] is a 72 y.o. female who complains of right hip pain after fall. The pain is sharp in character. The pain is severe and 10/10. The pain is worse with movement and better with rest. Denies any numbness, tingling or constitutional symptoms.  Past Medical History:  Diagnosis Date   Anxiety and depression     DJD (degenerative joint disease)    Elevated cholesterol    Endometrial polyp    Family history of bladder cancer    Family history of lymphoma    History of radiation therapy 10/25/16-12/01/16   right pelvis/60.2 Gy in 28 fractions   IRRITABLE BOWEL SYNDROME, HX OF 05/15/2008   Kidney stone    MVP (mitral valve prolapse)    occ palpitations    Ovarian ca ([place]) 07/2012       Rheumatoid arthritis(714.0) ~ 2010   dr Ouida Sills   Shingles    Past Surgical History:  Procedure Laterality Date   ABDOMINAL HYSTERECTOMY  08/22/2012   Procedure: HYSTERECTOMY ABDOMINAL;  Surgeon: Alvino Chapel, MD;  Location: [facility];  Service: Gynecology;  Laterality: N/A;   BACK SURGERY  10/04/2006   Dr Trenton Gammon   COLONOSCOPY  04/20/2004   COLONOSCOPY W/ BIOPSIES  07/04/2014   COLOSTOMY REVISION  08/22/2012   Procedure: COLON RESECTION SIGMOID;  Surgeon: Alvino Chapel, MD;  Location: [facility];  Service: Gynecology;;  Rectal Sigmoid resection and low rectal anastomosis   CYST ON NECK  10/04/2009   DEBULKING  08/22/2012   Procedure: DEBULKING;  Surgeon: Alvino Chapel, MD;  Location: [facility];  Service: Gynecology;  Laterality: N/A;  Radical tumor debulking, Bilateral Ureterolysis   DILATION AND CURETTAGE OF UTERUS  10/04/2002   WITH HYSTEROSCOPY   HAND SURGERY  10/04/1994   HYSTEROSCOPY  10/04/2002   D&C   IR IMAGING GUIDED PORT INSERTION  09/01/2021   KIDNEY STONE SURGERY Right    with stent placement  Mar 03 2021   NECK SURGERY  10/05/2007    SPURS   OMENTECTOMY  08/22/2012   Procedure: OMENTECTOMY;  Surgeon: Alvino Chapel, MD;  Location: [facility];  Service: Gynecology;  Laterality: N/A;   SALPINGOOPHORECTOMY  08/22/2012   Procedure: SALPINGO OOPHORECTOMY;  Surgeon: Alvino Chapel, MD;  Location: [facility];  Service: Gynecology;  Laterality: Bilateral;   TUBAL LIGATION  10/04/1978   Social History   Socioeconomic History   Marital status: Married    Spouse name: Not on file   Number of children: 2   Years of education: Not on file   Highest education level: Not on file  Occupational History   Occupation: disability d/t RA  Tobacco Use   Smoking status: Never   Smokeless tobacco: Never  Vaping Use   Vaping Use: Never used  Substance and Sexual Activity   Alcohol use: No   Drug use: No   Sexual activity: Never    Birth control/protection: Post-menopausal, Surgical  Other Topics Concern   Not on file  Social History Narrative   Anne Ng Beulah Gandy is her daughter    Household-- pr and husband   Social Determinants of Health   Financial Resource Strain: Low Risk  (11/17/2021)   Overall Financial Resource Strain (CARDIA)    Difficulty of Paying Living Expenses: Not hard at all  Food Insecurity: No Food Insecurity (11/02/2022)   Hunger Vital Sign    Worried About  Running Out of Food in the Last Year: Never true    [place] in the Last Year: Never true  Transportation Needs: No Transportation Needs (11/02/2022)   PRAPARE - Hydrologist (Medical): No    Lack of Transportation (Non-Medical): No  Physical Activity: Insufficiently Active (11/17/2021)   Exercise Vital Sign    Days of Exercise per Week: 1 day    Minutes of Exercise per Session: 30 min  Stress: Stress Concern Present (11/17/2021)   [place]    Feeling of Stress : To some extent  Social Connections: Moderately Isolated (11/17/2021)   Social  Connection and Isolation Panel [NHANES]    Frequency of Communication with Friends and Family: Once a week    Frequency of Social Gatherings with Friends and Family: Once a week    Attends Religious Services: Never    Marine scientist or Organizations: Yes    Attends Archivist Meetings: Never    Marital Status: Married   Family History  Problem Relation Age of Onset   Heart disease Mother    Bladder Cancer Father 40   Osteoporosis Sister    Lymphoma Paternal Aunt    Colon cancer Neg Hx    Rectal cancer Neg Hx    Stomach cancer Neg Hx    Diabetes Neg Hx    Stroke Neg Hx    Colon polyps Neg Hx    Allergies  Allergen Reactions   Atorvastatin Other (See Comments)    Leg pain   Macrodantin Other (See Comments)    Unknown Reaction   Decongest-Aid [Pseudoephedrine] Palpitations   Prior to Admission medications   Medication Sig Start Date End Date Taking? Authorizing Provider  fish oil-omega-3 fatty acids 1000 MG capsule Take 1 g by mouth daily.   Yes [provider]  lidocaine-prilocaine (EMLA) cream Apply 1 Application topically as directed. Apply 1/2 tablespoon to port site and cover with Press-and-Seal 2 hours prior to IV stick to numb site. Begin use 14 days after port is inserted. 09/14/22  Yes Ladell Pier, MD  Multiple Vitamin (MULTIVITAMIN WITH MINERALS) TABS Take 1 tablet by mouth daily.   Yes [provider]  Red Yeast Rice Extract 600 MG CAPS Take 1 capsule by mouth daily.   Yes [provider]  ondansetron (ZOFRAN) 8 MG tablet Take 1 tablet (8 mg total) by mouth every 8 (eight) hours as needed for nausea or vomiting. Patient not taking: Reported on 11/02/2022 09/14/22   Ladell Pier, MD  prochlorperazine (COMPAZINE) 10 MG tablet Take 1 tablet (10 mg total) by mouth every 6 (six) hours as needed. Patient not taking: Reported on 09/14/2022 09/14/22   Ladell Pier, MD   DG Chest 1 View  Result Date: 11/02/2022 CLINICAL  DATA:  [place] with right hip pain EXAM: CHEST  1 VIEW COMPARISON:  09/11/2013 FINDINGS: Heart size is normal. Mediastinal shadows are normal. Power port on the right has its tip in the SVC at the azygos level. The lungs are clear. No effusions. No acute bone finding. IMPRESSION: No active disease. Power port tip in the SVC at the azygos level. Electronically Signed   By: Nelson Chimes M.D.   On: 11/02/2022 11:41   DG Hip Unilat  With Pelvis 2-3 Views Right  Result Date: 11/02/2022 CLINICAL DATA:  [place] with pain in the right hip and groin. EXAM: DG HIP (WITH OR  WITHOUT PELVIS) 2-3V RIGHT COMPARISON:  None Available. FINDINGS: Acute femoral neck fracture on the right with mild angulation. No other regional fracture intertrochanteric region appears intact. IMPRESSION: Acute femoral neck fracture on the right with mild angulation. Electronically Signed   By: Nelson Chimes M.D.   On: 11/02/2022 11:40    Positive ROS: All other systems have been reviewed and were otherwise negative with the exception of those mentioned in the HPI and as above.  Physical Exam: General: Alert, no acute distress Cardiovascular: No pedal edema Respiratory: No cyanosis, no use of accessory musculature GI: No organomegaly, abdomen is soft and non-tender Skin: No lesions in the area of chief complaint Neurologic: Sensation intact distally Psychiatric: Patient is competent for consent with normal mood and affect Lymphatic: No axillary or cervical lymphadenopathy  MUSCULOSKELETAL: right leg short and externally rotated. Compartments soft. Good cap refill. Motor and sensory intact distally.  Assessment: Right femoral neck fracture, closed, displaced  Plan: Plan a right hip hemiarthroplasty.  The diagnosis, risks, benefits and alternatives to treatment are all discussed in detail with the patient and family. Risks include but are not limited to bleeding, infection, deep vein thrombosis, pulmonary embolism, nerve or vascular  injury, non-union, repeat operation, persistent pain, weakness, stiffness and death. She understands and is eager to proceed.     Lovell Sheehan, MD    11/03/2022 11:44 AM

## 2022-11-02 NOTE — Plan of Care (Signed)
  Problem: Pain Managment: Goal: General experience of comfort will improve Outcome: Progressing   Problem: Safety: Goal: Ability to remain free from injury will improve Outcome: Progressing   Problem: Skin Integrity: Goal: Risk for impaired skin integrity will decrease Outcome: Progressing

## 2022-11-02 NOTE — H&P (Addendum)
History and Physical    Patient: [name] [name] KDT:[id number] DOB: 1950/12/22 DOA: 11/02/2022 DOS: the patient was seen and examined on 11/02/2022 PCP: Michela Pitcher, NP  Patient coming from: Home  Chief Complaint:  Chief Complaint  Patient presents with   Hip Pain   HPI: [name] [name] is a 72 y.o. female with medical history significant of ovarian cancer on chemotherapy, rheumatoid arthritis not on DMARDs, anxiety/depression, hyperlipidemia, osteopenia who presents to the ED with complaints of hip pain.  [name] [name] states that she was in her usual state of health today when she was walking outside, and tripped on her own feet.  She denies any symptoms prior to tripping.  She fell and landed onto her right hip and right knee.  She denies hitting her head or loss of consciousness.  After falling, she did become dizzy and felt very hot and this lasted for approximately 5 minutes.  Symptoms have not reoccurred.  She denies any shortness of breath, chest pain, palpitations, nausea, vomiting, diarrhea, abdominal pain.  Since falling, she has not been able to bear weight.  ED course: On arrival to the ED, patient was mildly hypertensive at 144/87 with heart rate of 84.  She was afebrile at 98.4.  She was saturating at 96% on room air. Workup remarkable for acute femoral neck fracture on the right.  Blood work still pending.  Orthopedic surgery consulted.  TRH consulted for admission.  Review of Systems: As mentioned in the history of present illness. All other systems reviewed and are negative.  Past Medical History:  Diagnosis Date   Anxiety and depression     DJD (degenerative joint disease)    Elevated cholesterol    Endometrial polyp    Family history of bladder cancer    Family history of lymphoma    History of radiation therapy 10/25/16-12/01/16   right pelvis/60.2 Gy in 28 fractions   IRRITABLE BOWEL SYNDROME, HX OF 05/15/2008   Kidney stone    MVP (mitral valve prolapse)     occ palpitations    Ovarian ca ([place]) 07/2012       Rheumatoid arthritis(714.0) ~ 2010   dr Ouida Sills   Shingles    Past Surgical History:  Procedure Laterality Date   ABDOMINAL HYSTERECTOMY  08/22/2012   Procedure: HYSTERECTOMY ABDOMINAL;  Surgeon: Alvino Chapel, MD;  [facility]: [facility];  Service: Gynecology;  Laterality: N/A;   BACK SURGERY  10/04/2006   Dr Trenton Gammon   COLONOSCOPY  04/20/2004   COLONOSCOPY W/ BIOPSIES  07/04/2014   COLOSTOMY REVISION  08/22/2012   Procedure: COLON RESECTION SIGMOID;  Surgeon: Alvino Chapel, MD;  Location[facility];  Service: Gynecology;;  Rectal Sigmoid resection and low rectal anastomosis   CYST ON NECK  10/04/2009   DEBULKING  08/22/2012   Procedure: DEBULKING;  Surgeon: Alvino Chapel, MD;  Location: [facility];  Service: Gynecology;  Laterality: N/A;  Radical tumor debulking, Bilateral Ureterolysis   DILATION AND CURETTAGE OF UTERUS  10/04/2002   WITH HYSTEROSCOPY   HAND SURGERY  10/04/1994   HYSTEROSCOPY  10/04/2002   D&C   IR IMAGING GUIDED PORT INSERTION  09/01/2021   KIDNEY STONE SURGERY Right    with stent placement  Mar 03 2021   NECK SURGERY  10/05/2007   SPURS   OMENTECTOMY  08/22/2012   Procedure: OMENTECTOMY;  Surgeon: Alvino Chapel, MD;  Location: [facility];  Service: Gynecology;  Laterality: N/A;   SALPINGOOPHORECTOMY  08/22/2012  Procedure: SALPINGO OOPHORECTOMY;  Surgeon: Alvino Chapel, MD;  Location: [facility];  Service: Gynecology;  Laterality: Bilateral;   TUBAL LIGATION  10/04/1978   Social History:  reports that she has never smoked. She has never used smokeless tobacco. She reports that she does not drink alcohol and does not use drugs.  Allergies  Allergen Reactions   Atorvastatin Other (See Comments)    Leg pain   Macrodantin Other (See Comments)    Unknown Reaction   Decongest-Aid [Pseudoephedrine] Palpitations    Family History  Problem Relation Age of Onset   Heart disease  Mother    Bladder Cancer Father 60   Osteoporosis Sister    Lymphoma Paternal Aunt    Colon cancer Neg Hx    Rectal cancer Neg Hx    Stomach cancer Neg Hx    Diabetes Neg Hx    Stroke Neg Hx    Colon polyps Neg Hx     Prior to Admission medications   Medication Sig Start Date End Date Taking? Authorizing Provider  fish oil-omega-3 fatty acids 1000 MG capsule Take 1 g by mouth daily.    [provider]  lidocaine-prilocaine (EMLA) cream Apply 1 Application topically as directed. Apply 1/2 tablespoon to port site and cover with Press-and-Seal 2 hours prior to IV stick to numb site. Begin use 14 days after port is inserted. 09/14/22   Ladell Pier, MD  Multiple Vitamin (MULTIVITAMIN WITH MINERALS) TABS Take 1 tablet by mouth daily.    [provider]  ondansetron (ZOFRAN) 8 MG tablet Take 1 tablet (8 mg total) by mouth every 8 (eight) hours as needed for nausea or vomiting. Patient not taking: Reported on 10/12/2022 09/14/22   Ladell Pier, MD  prochlorperazine (COMPAZINE) 10 MG tablet Take 1 tablet (10 mg total) by mouth every 6 (six) hours as needed. Patient not taking: Reported on 09/14/2022 09/14/22   Ladell Pier, MD  Red Yeast Rice Extract 600 MG CAPS Take 1 capsule by mouth daily.    [provider]    Physical Exam: Vitals:   11/02/22 1054 11/02/22 1312 11/02/22 1343 11/02/22 1609  BP:  122/67 (!) 141/79 102/64  Pulse:   98 80  Resp:  '16 18 18  '$ Temp:  98.4 F (36.9 C) 99 F (37.2 C) 98.2 F (36.8 C)  TempSrc:  Oral Oral   SpO2:  93% 99% 92%  Weight: 69.9 kg     Height: '5\' 1"'$  (1.549 m)      Physical Exam Vitals and nursing note reviewed.  Constitutional:      General: She is not in acute distress.    Appearance: She is normal weight. She is not toxic-appearing.  HENT:     Head: Normocephalic and atraumatic.     Mouth/Throat:     Mouth: Mucous membranes are moist.     Pharynx: Oropharynx is clear.  Cardiovascular:     Rate and  Rhythm: Normal rate and regular rhythm.     Pulses:          Dorsalis pedis pulses are 1+ on the right side and 2+ on the left side.     Heart sounds: No murmur heard.    No gallop.  Pulmonary:     Effort: Pulmonary effort is normal. No respiratory distress.     Breath sounds: Normal breath sounds. No wheezing, rhonchi or rales.  Abdominal:     General: Bowel sounds are normal. There is no distension.  Palpations: Abdomen is soft.     Tenderness: There is no abdominal tenderness.  Musculoskeletal:     Right lower leg: No edema.     Left lower leg: No edema.  Skin:    General: Skin is dry.     Findings: No erythema or rash.     Comments: Right foot notably cooler than left  Neurological:     General: No focal deficit present.     Mental Status: She is alert and oriented to person, place, and time.  Psychiatric:        Mood and Affect: Mood normal.        Behavior: Behavior normal.    Data Reviewed: CBC with WBC of 11.2, hemoglobin of 14.4 and MCV of 100.9 and platelets of 246 BMP with sodium of 135, potassium 3.9, bicarb 25, BUN 22 creatinine 0.68 with GFR above 60. INR within normal limits at 1.0.    EKG personally reviewed.  Sinus rhythm with a rate of 82.  No ST or T wave changes to suggest acute ischemia  DG Chest 1 View  Result Date: 11/02/2022 CLINICAL DATA:  [place] with right hip pain EXAM: CHEST  1 VIEW COMPARISON:  09/11/2013 FINDINGS: Heart size is normal. Mediastinal shadows are normal. Power port on the right has its tip in the SVC at the azygos level. The lungs are clear. No effusions. No acute bone finding. IMPRESSION: No active disease. Power port tip in the SVC at the azygos level. Electronically Signed   By: Nelson Chimes M.D.   On: 11/02/2022 11:41   DG Hip Unilat  With Pelvis 2-3 Views Right  Result Date: 11/02/2022 CLINICAL DATA:  [place] with pain in the right hip and groin. EXAM: DG HIP (WITH OR WITHOUT PELVIS) 2-3V RIGHT COMPARISON:  None Available.  FINDINGS: Acute femoral neck fracture on the right with mild angulation. No other regional fracture intertrochanteric region appears intact. IMPRESSION: Acute femoral neck fracture on the right with mild angulation. Electronically Signed   By: Nelson Chimes M.D.   On: 11/02/2022 11:40    Results are pending, will review when available.  Assessment and Plan:  * Femur fracture [facility]) Patient is presenting with acute right femur fracture in the setting of a mechanical fall. Score of 0 points on revised cardiac risk index.   - Orthopedic surgery consulted; appreciate their recommendations - N.p.o. status for now; will touch base with orthopedic surgeon regarding timing of surgery - Norco and Dilaudid for pain control - Continuous pulse oximetry for now  Ovarian cancer [facility]) Patient has a history of ovarian cancer currently on carboplatin.  Last dose received on 10/26/2022.  Advance Care Planning:   Code Status: Full Code   Consults: Orthopedic surgery  Family Communication: Patient's husband updated at bedside  Severity of Illness: The appropriate patient status for this patient is INPATIENT. Inpatient status is judged to be reasonable and necessary in order to provide the required intensity of service to ensure the patient's safety. The patient's presenting symptoms, physical exam findings, and initial radiographic and laboratory data in the context of their chronic comorbidities is felt to place them at high risk for further clinical deterioration. Furthermore, it is not anticipated that the patient will be medically stable for discharge from the hospital within 2 midnights of admission.   * I certify that at the point of admission it is my clinical judgment that the patient will require inpatient hospital care spanning beyond 2 midnights from the point of  admission due to high intensity of service, high risk for further deterioration and high frequency of surveillance  required.*  Author: Jose Persia, MD 11/02/2022 11:30 PM  For on call review [email or web address].

## 2022-11-02 NOTE — Assessment & Plan Note (Addendum)
Patient is presenting with acute right femur fracture in the setting of a mechanical fall. Score of 0 points on revised cardiac risk index.   - Orthopedic surgery consulted; appreciate their recommendations - N.p.o. status for now; will touch base with orthopedic surgeon regarding timing of surgery - Norco and Dilaudid for pain control - Continuous pulse oximetry for now

## 2022-11-02 NOTE — ED Triage Notes (Signed)
Pt states that she tripped and fell about an hour and a half ago, pt states that she is having right hip/groin pain, can't lift her leg up and her husband had to raise her leg to help her into the car, pt states that she feels like her right foot is turned outward, pt states that is currently getting chemo for ovarian cancer

## 2022-11-03 ENCOUNTER — Inpatient Hospital Stay: Payer: Medicare Other

## 2022-11-03 ENCOUNTER — Encounter
Admission: EM | Disposition: A | Discharge status: Home Health | Payer: Self-pay | Source: Home / Self Care | Attending: Obstetrics and Gynecology

## 2022-11-03 ENCOUNTER — Inpatient Hospital Stay: Payer: Medicare Other | Admitting: Certified Registered"

## 2022-11-03 ENCOUNTER — Encounter: Payer: Self-pay | Admitting: Internal Medicine

## 2022-11-03 ENCOUNTER — Other Ambulatory Visit: Payer: Self-pay

## 2022-11-03 DIAGNOSIS — S72141A Displaced intertrochanteric fracture of right femur, initial encounter for closed fracture: Secondary | ICD-10-CM | POA: Diagnosis not present

## 2022-11-03 HISTORY — PX: ANTERIOR APPROACH HEMI HIP ARTHROPLASTY: SHX6690

## 2022-11-03 LAB — CBC WITH DIFFERENTIAL/PLATELET
Abs Immature Granulocytes: 0.03 10*3/uL (ref 0.00–0.07)
Basophils Absolute: 0 10*3/uL (ref 0.0–0.1)
Basophils Relative: 1 %
Eosinophils Absolute: 0 10*3/uL (ref 0.0–0.5)
Eosinophils Relative: 1 %
HCT: 40.1 % (ref 36.0–46.0)
Hemoglobin: 13.2 g/dL (ref 12.0–15.0)
Immature Granulocytes: 1 %
Lymphocytes Relative: 13 %
Lymphs Abs: 0.7 10*3/uL (ref 0.7–4.0)
MCH: 32.8 pg (ref 26.0–34.0)
MCHC: 32.9 g/dL (ref 30.0–36.0)
MCV: 99.5 fL (ref 80.0–100.0)
Monocytes Absolute: 0.7 10*3/uL (ref 0.1–1.0)
Monocytes Relative: 12 %
Neutro Abs: 4.2 10*3/uL (ref 1.7–7.7)
Neutrophils Relative %: 72 %
Platelets: 217 10*3/uL (ref 150–400)
RBC: 4.03 MIL/uL (ref 3.87–5.11)
RDW: 14.2 % (ref 11.5–15.5)
WBC: 5.7 10*3/uL (ref 4.0–10.5)
nRBC: 0 % (ref 0.0–0.2)

## 2022-11-03 LAB — TYPE AND SCREEN
ABO/RH(D): A POS
Antibody Screen: NEGATIVE

## 2022-11-03 SURGERY — HEMIARTHROPLASTY, HIP, DIRECT ANTERIOR APPROACH, FOR FRACTURE
Anesthesia: Spinal | Laterality: Right

## 2022-11-03 MED ORDER — MAGNESIUM CITRATE PO SOLN: 1.0000 | Freq: Once | ORAL | Status: DC | PRN

## 2022-11-03 MED ORDER — ALUM & MAG HYDROXIDE-SIMETH 200-200-20 MG/5ML PO SUSP: 30.0000 mL | ORAL | Status: DC | PRN

## 2022-11-03 MED ORDER — ASPIRIN 81 MG PO CHEW
81.0000 mg | CHEWABLE_TABLET | Freq: Two times a day (BID) | ORAL | Status: DC
  Administered 2022-11-03 – 2022-11-04 (×2): 81 mg via ORAL
  Filled 2022-11-03 (×2): qty 1

## 2022-11-03 MED ORDER — EPINEPHRINE PF 1 MG/ML IJ SOLN
INTRAMUSCULAR | Status: AC
  Filled 2022-11-03: qty 1

## 2022-11-03 MED ORDER — DOCUSATE SODIUM 100 MG PO CAPS
100.0000 mg | ORAL_CAPSULE | Freq: Two times a day (BID) | ORAL | Status: DC
  Administered 2022-11-03 – 2022-11-04 (×3): 100 mg via ORAL
  Filled 2022-11-03 (×3): qty 1

## 2022-11-03 MED ORDER — ONDANSETRON HCL 4 MG/2ML IJ SOLN
INTRAMUSCULAR | Status: AC
  Filled 2022-11-03: qty 2

## 2022-11-03 MED ORDER — ONDANSETRON HCL 4 MG/2ML IJ SOLN: 4.0000 mg | Freq: Once | INTRAMUSCULAR | Status: DC | PRN

## 2022-11-03 MED ORDER — LIDOCAINE HCL (PF) 2 % IJ SOLN
INTRAMUSCULAR | Status: AC
  Filled 2022-11-03: qty 5

## 2022-11-03 MED ORDER — METOCLOPRAMIDE HCL 5 MG/ML IJ SOLN: 5.0000 mg | Freq: Three times a day (TID) | INTRAMUSCULAR | Status: DC | PRN

## 2022-11-03 MED ORDER — LACTATED RINGERS IV SOLN: INTRAVENOUS | Status: DC | PRN

## 2022-11-03 MED ORDER — HYDROCODONE-ACETAMINOPHEN 5-325 MG PO TABS: 1.0000 | ORAL_TABLET | ORAL | Status: DC | PRN

## 2022-11-03 MED ORDER — ENOXAPARIN SODIUM 40 MG/0.4ML IJ SOSY: 40.0000 mg | PREFILLED_SYRINGE | INTRAMUSCULAR | Status: DC

## 2022-11-03 MED ORDER — MIDAZOLAM HCL 2 MG/2ML IJ SOLN
INTRAMUSCULAR | Status: AC
  Filled 2022-11-03: qty 2

## 2022-11-03 MED ORDER — ONDANSETRON HCL 4 MG/2ML IJ SOLN: 4.0000 mg | Freq: Four times a day (QID) | INTRAMUSCULAR | Status: DC | PRN

## 2022-11-03 MED ORDER — BUPIVACAINE HCL (PF) 0.5 % IJ SOLN
INTRAMUSCULAR | Status: DC | PRN
  Administered 2022-11-03: 2.5 mL via INTRATHECAL

## 2022-11-03 MED ORDER — CEFAZOLIN SODIUM-DEXTROSE 1-4 GM/50ML-% IV SOLN
1.0000 g | Freq: Four times a day (QID) | INTRAVENOUS | Status: AC
  Administered 2022-11-03 (×2): 1 g via INTRAVENOUS
  Filled 2022-11-03 (×2): qty 50

## 2022-11-03 MED ORDER — POLYETHYLENE GLYCOL 3350 17 G PO PACK
17.0000 g | PACK | Freq: Every day | ORAL | Status: DC
  Administered 2022-11-03 – 2022-11-04 (×2): 17 g via ORAL
  Filled 2022-11-03 (×2): qty 1

## 2022-11-03 MED ORDER — PROPOFOL 10 MG/ML IV BOLUS
INTRAVENOUS | Status: DC | PRN
  Administered 2022-11-03: 20 mg via INTRAVENOUS
  Administered 2022-11-03: 100 ug/kg/min via INTRAVENOUS

## 2022-11-03 MED ORDER — ACETAMINOPHEN 325 MG PO TABS: 325.0000 mg | ORAL_TABLET | Freq: Four times a day (QID) | ORAL | Status: DC | PRN

## 2022-11-03 MED ORDER — BUPIVACAINE HCL (PF) 0.5 % IJ SOLN
INTRAMUSCULAR | Status: AC
  Filled 2022-11-03: qty 10

## 2022-11-03 MED ORDER — KETOROLAC TROMETHAMINE 15 MG/ML IJ SOLN
7.5000 mg | Freq: Four times a day (QID) | INTRAMUSCULAR | Status: AC
  Administered 2022-11-03 – 2022-11-04 (×4): 7.5 mg via INTRAVENOUS
  Filled 2022-11-03 (×4): qty 1

## 2022-11-03 MED ORDER — ONDANSETRON HCL 4 MG PO TABS: 4.0000 mg | ORAL_TABLET | Freq: Four times a day (QID) | ORAL | Status: DC | PRN

## 2022-11-03 MED ORDER — DEXAMETHASONE SODIUM PHOSPHATE 10 MG/ML IJ SOLN
INTRAMUSCULAR | Status: DC | PRN
  Administered 2022-11-03: 10 mg via INTRAVENOUS

## 2022-11-03 MED ORDER — SODIUM CHLORIDE 0.9 % IR SOLN
Status: DC | PRN
  Administered 2022-11-03: 250 mL

## 2022-11-03 MED ORDER — METOCLOPRAMIDE HCL 5 MG PO TABS: 5.0000 mg | ORAL_TABLET | Freq: Three times a day (TID) | ORAL | Status: DC | PRN

## 2022-11-03 MED ORDER — LACTATED RINGERS IV SOLN: INTRAVENOUS | Status: DC

## 2022-11-03 MED ORDER — PHENYLEPHRINE HCL-NACL 20-0.9 MG/250ML-% IV SOLN
INTRAVENOUS | Status: AC
  Filled 2022-11-03: qty 250

## 2022-11-03 MED ORDER — PROPOFOL 10 MG/ML IV BOLUS
INTRAVENOUS | Status: AC
  Filled 2022-11-03: qty 20

## 2022-11-03 MED ORDER — 0.9 % SODIUM CHLORIDE (POUR BTL) OPTIME
TOPICAL | Status: DC | PRN
  Administered 2022-11-03: 500 mL

## 2022-11-03 MED ORDER — FENTANYL CITRATE (PF) 100 MCG/2ML IJ SOLN
INTRAMUSCULAR | Status: DC | PRN
  Administered 2022-11-03: 25 ug via INTRAVENOUS

## 2022-11-03 MED ORDER — PHENYLEPHRINE HCL-NACL 20-0.9 MG/250ML-% IV SOLN
INTRAVENOUS | Status: DC | PRN
  Administered 2022-11-03: 40 ug/min via INTRAVENOUS

## 2022-11-03 MED ORDER — IRRISEPT - 450ML BOTTLE WITH 0.05% CHG IN STERILE WATER, USP 99.95% OPTIME
TOPICAL | Status: DC | PRN
  Administered 2022-11-03: 450 mL

## 2022-11-03 MED ORDER — PROPOFOL 1000 MG/100ML IV EMUL
INTRAVENOUS | Status: AC
  Filled 2022-11-03: qty 100

## 2022-11-03 MED ORDER — PHENYLEPHRINE 80 MCG/ML (10ML) SYRINGE FOR IV PUSH (FOR BLOOD PRESSURE SUPPORT)
PREFILLED_SYRINGE | INTRAVENOUS | Status: AC
  Filled 2022-11-03: qty 10

## 2022-11-03 MED ORDER — BUPIVACAINE-MELOXICAM ER 200-6 MG/7ML IJ SOLN
INTRAMUSCULAR | Status: AC
  Filled 2022-11-03: qty 1

## 2022-11-03 MED ORDER — BISACODYL 10 MG RE SUPP
10.0000 mg | Freq: Every day | RECTAL | Status: DC | PRN
  Administered 2022-11-04: 10 mg via RECTAL
  Filled 2022-11-03: qty 1

## 2022-11-03 MED ORDER — FENTANYL CITRATE (PF) 100 MCG/2ML IJ SOLN
INTRAMUSCULAR | Status: AC
  Filled 2022-11-03: qty 2

## 2022-11-03 MED ORDER — PHENOL 1.4 % MT LIQD: 1.0000 | OROMUCOSAL | Status: DC | PRN

## 2022-11-03 MED ORDER — FENTANYL CITRATE (PF) 100 MCG/2ML IJ SOLN: 25.0000 ug | INTRAMUSCULAR | Status: DC | PRN

## 2022-11-03 MED ORDER — CEFAZOLIN SODIUM-DEXTROSE 2-4 GM/100ML-% IV SOLN
INTRAVENOUS | Status: AC
  Filled 2022-11-03: qty 100

## 2022-11-03 MED ORDER — MENTHOL 3 MG MT LOZG: 1.0000 | LOZENGE | OROMUCOSAL | Status: DC | PRN

## 2022-11-03 MED ORDER — BUPIVACAINE-EPINEPHRINE (PF) 0.25% -1:200000 IJ SOLN
INTRAMUSCULAR | Status: DC | PRN
  Administered 2022-11-03: 30 mL via PERINEURAL

## 2022-11-03 MED ORDER — DEXAMETHASONE SODIUM PHOSPHATE 10 MG/ML IJ SOLN
INTRAMUSCULAR | Status: AC
  Filled 2022-11-03: qty 1

## 2022-11-03 MED ORDER — HYDROCODONE-ACETAMINOPHEN 7.5-325 MG PO TABS
1.0000 | ORAL_TABLET | ORAL | Status: DC | PRN
  Administered 2022-11-04: 2 via ORAL
  Filled 2022-11-03: qty 2

## 2022-11-03 MED ORDER — TRANEXAMIC ACID-NACL 1000-0.7 MG/100ML-% IV SOLN
INTRAVENOUS | Status: AC
  Filled 2022-11-03: qty 100

## 2022-11-03 MED ORDER — ONDANSETRON HCL 4 MG/2ML IJ SOLN
INTRAMUSCULAR | Status: DC | PRN
  Administered 2022-11-03: 4 mg via INTRAVENOUS

## 2022-11-03 MED ORDER — MAGNESIUM HYDROXIDE 400 MG/5ML PO SUSP: 30.0000 mL | Freq: Every day | ORAL | Status: DC | PRN

## 2022-11-03 MED ORDER — MIDAZOLAM HCL 5 MG/5ML IJ SOLN
INTRAMUSCULAR | Status: DC | PRN
  Administered 2022-11-03: 2 mg via INTRAVENOUS

## 2022-11-03 MED ORDER — MORPHINE SULFATE (PF) 4 MG/ML IV SOLN: 0.5000 mg | INTRAVENOUS | Status: DC | PRN

## 2022-11-03 MED ORDER — BUPIVACAINE HCL (PF) 0.25 % IJ SOLN
INTRAMUSCULAR | Status: AC
  Filled 2022-11-03: qty 30

## 2022-11-03 SURGICAL SUPPLY — 52 items
APL PRP STRL LF DISP 70% ISPRP (MISCELLANEOUS) ×2
BLADE SAGITTAL WIDE XTHICK NO (BLADE) ×2 IMPLANT
BNDG CMPR 5X4 CHSV STRCH STRL (GAUZE/BANDAGES/DRESSINGS) ×1
BNDG COHESIVE 4X5 TAN STRL LF (GAUZE/BANDAGES/DRESSINGS) IMPLANT
BRUSH SCRUB EZ  4% CHG (MISCELLANEOUS) ×1
BRUSH SCRUB EZ 4% CHG (MISCELLANEOUS) ×2 IMPLANT
CHLORAPREP W/TINT 26 (MISCELLANEOUS) ×4 IMPLANT
DRAPE 3/4 80X56 (DRAPES) ×2 IMPLANT
DRAPE C-ARM 42X72 X-RAY (DRAPES) ×2 IMPLANT
DRAPE U-SHAPE 47X51 STRL (DRAPES) ×2 IMPLANT
DRSG AQUACEL AG ADV 3.5X10 (GAUZE/BANDAGES/DRESSINGS) IMPLANT
DRSG AQUACEL AG ADV 3.5X14 (GAUZE/BANDAGES/DRESSINGS) IMPLANT
ELECT REM PT RETURN 9FT ADLT (ELECTROSURGICAL) ×1
ELECTRODE REM PT RTRN 9FT ADLT (ELECTROSURGICAL) ×2 IMPLANT
FEM HEAD COCR 28MM 3 (Orthopedic Implant) ×1 IMPLANT
FEMORAL HEAD COCR 28MM 3 (Orthopedic Implant) IMPLANT
GAUZE 4X4 16PLY ~~LOC~~+RFID DBL (SPONGE) ×2 IMPLANT
GAUZE XEROFORM 1X8 LF (GAUZE/BANDAGES/DRESSINGS) IMPLANT
GLOVE BIO SURGEON STRL SZ8 (GLOVE) ×4 IMPLANT
GLOVE BIOGEL PI IND STRL 8.5 (GLOVE) ×4 IMPLANT
GLOVE PI ORTHO PRO STRL SZ8 (GLOVE) ×4 IMPLANT
GOWN STRL REUS W/ TWL XL LVL3 (GOWN DISPOSABLE) ×4 IMPLANT
GOWN STRL REUS W/TWL XL LVL3 (GOWN DISPOSABLE) ×2
HEAD BIPOLAR 45MM (Hips) IMPLANT
HOOD PEEL AWAY T7 (MISCELLANEOUS) ×6 IMPLANT
IV NS 250ML (IV SOLUTION) ×1
IV NS 250ML BAXH (IV SOLUTION) IMPLANT
IV NS IRRIG 3000ML ARTHROMATIC (IV SOLUTION) ×2 IMPLANT
KIT PATIENT CARE HANA TABLE (KITS) ×2 IMPLANT
KIT TURNOVER CYSTO (KITS) ×2 IMPLANT
MANIFOLD NEPTUNE II (INSTRUMENTS) ×2 IMPLANT
MAT ABSORB  FLUID 56X50 GRAY (MISCELLANEOUS) ×1
MAT ABSORB FLUID 56X50 GRAY (MISCELLANEOUS) ×2 IMPLANT
NDL SAFETY ECLIP 18X1.5 (MISCELLANEOUS) IMPLANT
NDL SPNL 20GX3.5 QUINCKE YW (NEEDLE) ×2 IMPLANT
NEEDLE SPNL 20GX3.5 QUINCKE YW (NEEDLE) ×1 IMPLANT
PACK HIP PROSTHESIS (MISCELLANEOUS) ×2 IMPLANT
PADDING CAST BLEND 4X4 NS (MISCELLANEOUS) ×4 IMPLANT
PENCIL SMOKE EVACUATOR (MISCELLANEOUS) ×2 IMPLANT
PILLOW ABDUCTION MEDIUM (MISCELLANEOUS) ×2 IMPLANT
PULSAVAC PLUS IRRIG FAN TIP (DISPOSABLE) ×1
STAPLER SKIN PROX 35W (STAPLE) ×2 IMPLANT
STEM STD COLLAR SZ2 POLARSTEM (Stem) IMPLANT
SUT BONE WAX W31G (SUTURE) ×2 IMPLANT
SUT DVC 2 QUILL PDO  T11 36X36 (SUTURE) ×1
SUT DVC 2 QUILL PDO T11 36X36 (SUTURE) ×2 IMPLANT
SUT VIC AB 2-0 CT1 18 (SUTURE) ×2 IMPLANT
SYR 30ML LL (SYRINGE) ×2 IMPLANT
TIP FAN IRRIG PULSAVAC PLUS (DISPOSABLE) ×2 IMPLANT
TRAP FLUID SMOKE EVACUATOR (MISCELLANEOUS) ×2 IMPLANT
WAND WEREWOLF FASTSEAL 6.0 (MISCELLANEOUS) ×2 IMPLANT
WATER STERILE IRR 500ML POUR (IV SOLUTION) ×2 IMPLANT

## 2022-11-03 NOTE — Op Note (Signed)
11/03/2022  1:35 PM  PATIENT:  [name] [name]   MRN: [id number]  PRE-OPERATIVE DIAGNOSIS:  Displaced Subcapital fracture right hip   POST-OPERATIVE DIAGNOSIS: Same  Procedure: Right Hip Anterior Hip Hemiarthroplasty   Surgeon: Elyn Aquas. Harlow Mares, MD   Assist: Carlynn Spry, PA-C  Anesthesia: Spinal   EBL: 100 mL   Specimens: None   Drains: None   Components used: A size 2 Polarstem [employer], a 45 mm by -3 mm bipolar head    Description of the procedure in detail: After informed consent was obtained and the appropriate extremity marked in the pre-operative holding area, the patient was taken to the operating room and placed in the supine position on the fracture table. All pressure points were well padded and bilateral lower extremities were place in traction spars. The hip was prepped and draped in standard sterile fashion. A spinal anesthetic had been delivered by the anesthesia team. The skin and subcutaneous tissues were injected with a mixture of Marcaine with epinephrine for post-operative pain. A longitudinal incision approximately 10 cm in length was carried out from the anterior superior iliac spine to the greater trochanter. The tensor fascia was divided and blunt dissection was taken down to the level of the joint capsule. The lateral circumflex vessels were cauterized. Deep retractors were placed and a portion of the anterior capsule was excised. Using fluoroscopy the neck cut was planned and carried out with a sagittal saw. The head was passed from the field with use of a corkscrew and hip skid. Deep retractors were placed along the acetabulum and bony and soft tissue debris was removed.   Attention was then turned to the proximal femur. The leg was placed in extension and external rotation. The canal was opened and sequentially broached to a size 2. The trial components were placed and the hip relocated. The components were found to be in good position using fluoroscopy.  The hip was dislocated and the trial components removed. The final components were impacted in to position and the hip relocated. The final components were again check with fluoroscopy and found to be in good position. Hemostasis was achieved with electrocautery. The deep capsule was injected with Marcaine and epinephrine. The wound was irrigated with bacitracin laced normal saline and the tensor fascia closed with #2 Quill suture. The subcutaneous tissues were closed with 2-0 vicryl and staples for the skin. A sterile dressing was applied and an abduction pillow. Patient tolerated the procedure well and there were no apparent complication. Patient was taken to the recovery room in good condition.    Elyn Aquas. Harlow Mares, MD  11/03/2022 1:35 PM

## 2022-11-03 NOTE — Plan of Care (Signed)
  Problem: Education: Goal: Knowledge of General Education information will improve Description: Including pain rating scale, medication(s)/side effects and non-pharmacologic comfort measures 11/03/2022 0319 by Fernande Bras, RN Outcome: Progressing 11/03/2022 0318 by Fernande Bras, RN Outcome: Progressing   Problem: Health Behavior/Discharge Planning: Goal: Ability to manage health-related needs will improve 11/03/2022 0319 by Fernande Bras, RN Outcome: Progressing 11/03/2022 0318 by Fernande Bras, RN Outcome: Progressing   Problem: Clinical Measurements: Goal: Ability to maintain clinical measurements within normal limits will improve 11/03/2022 0319 by Fernande Bras, RN Outcome: Progressing 11/03/2022 0318 by Fernande Bras, RN Outcome: Progressing Goal: Will remain free from infection 11/03/2022 0319 by Fernande Bras, RN Outcome: Progressing 11/03/2022 0318 by Fernande Bras, RN Outcome: Progressing Goal: Diagnostic test results will improve 11/03/2022 0319 by Fernande Bras, RN Outcome: Progressing 11/03/2022 0318 by Fernande Bras, RN Outcome: Progressing Goal: Respiratory complications will improve 11/03/2022 0319 by Fernande Bras, RN Outcome: Progressing 11/03/2022 0318 by Fernande Bras, RN Outcome: Progressing Goal: Cardiovascular complication will be avoided 11/03/2022 0319 by Fernande Bras, RN Outcome: Progressing 11/03/2022 0318 by Fernande Bras, RN Outcome: Progressing   Problem: Activity: Goal: Risk for activity intolerance will decrease 11/03/2022 0319 by Fernande Bras, RN Outcome: Progressing 11/03/2022 0318 by Fernande Bras, RN Outcome: Progressing   Problem: Nutrition: Goal: Adequate nutrition will be maintained 11/03/2022 0319 by Fernande Bras, RN Outcome: Progressing 11/03/2022 0318 by Fernande Bras, RN Outcome: Progressing   Problem:  Coping: Goal: Level of anxiety will decrease 11/03/2022 0319 by Fernande Bras, RN Outcome: Progressing 11/03/2022 0318 by Fernande Bras, RN Outcome: Progressing   Problem: Elimination: Goal: Will not experience complications related to bowel motility 11/03/2022 0319 by Fernande Bras, RN Outcome: Progressing 11/03/2022 0318 by Fernande Bras, RN Outcome: Progressing Goal: Will not experience complications related to urinary retention 11/03/2022 0319 by Fernande Bras, RN Outcome: Progressing 11/03/2022 0318 by Fernande Bras, RN Outcome: Progressing   Problem: Pain Managment: Goal: General experience of comfort will improve 11/03/2022 0319 by Fernande Bras, RN Outcome: Progressing 11/03/2022 0318 by Fernande Bras, RN Outcome: Progressing   Problem: Safety: Goal: Ability to remain free from injury will improve 11/03/2022 0319 by Fernande Bras, RN Outcome: Progressing 11/03/2022 0318 by Fernande Bras, RN Outcome: Progressing   Problem: Skin Integrity: Goal: Risk for impaired skin integrity will decrease 11/03/2022 0319 by Fernande Bras, RN Outcome: Progressing 11/03/2022 0318 by Fernande Bras, RN Outcome: Progressing

## 2022-11-03 NOTE — Progress Notes (Signed)
PROGRESS NOTE    [name] [name]  GXQ:[id number] DOB: 1951-02-22 DOA: 11/02/2022 PCP: Michela Pitcher, NP  Outpatient Specialists: oncology    Brief Narrative:   From admission h and p  [name] [name] is a 72 y.o. female with medical history significant of ovarian cancer on chemotherapy, rheumatoid arthritis not on DMARDs, anxiety/depression, hyperlipidemia, osteopenia who presents to the ED with complaints of hip pain.   [name] [name] states that she was in her usual state of health today when she was walking outside, and tripped on her own feet.  She denies any symptoms prior to tripping.  She fell and landed onto her right hip and right knee.  She denies hitting her head or loss of consciousness.  After falling, she did become dizzy and felt very hot and this lasted for approximately 5 minutes.  Symptoms have not reoccurred.  She denies any shortness of breath, chest pain, palpitations, nausea, vomiting, diarrhea, abdominal pain.  Since falling, she has not been able to bear weight.   Assessment & Plan:   Principal Problem:   Femur fracture ([place]) Active Problems:   Ovarian cancer ([facility])   Rheumatoid arthritis ([place])  # Displaced subcapital fracture right hip S/p right hip anterior hip hemiarthroplasty w/ Dr. Harlow Mares earlier today - pain control, bowel regimen - PT/OT consults, patient doesn't think she will want SNF but I have encouraged her to be open to the idea based on her function - dvt ppx per orthopedics, appears they have chosen aspirin - outpt f/u per ortho - PCP osteoporosis tx  # Ovarian cancer Followed by gyn onc, first diagnosed 9 years ago, has had multiple cycles of chemo currently on carboplatin - outpt f/u  # RA Quiescent   DVT prophylaxis: SCDs, aspirin Code Status: full Family Communication: daughter updated @ bedside 1/31  Level of care: Med-Surg Status is: Inpatient Remains inpatient appropriate because: severity of illness    Consultants:   ortho  Procedures: Hemiarthroplasty right hip  Antimicrobials:  Surgical ppx    Subjective: Tolerated surgery, pain controlled, has appetite  Objective: Vitals:   11/03/22 1415 11/03/22 1430 11/03/22 1438 11/03/22 1445  BP: (!) 105/57 103/65  98/73  Pulse: 63 69 69 66  Resp:      Temp:  (!) 97.4 F (36.3 C)    TempSrc:      SpO2: 97% 99% 96% 98%  Weight:      Height:        Intake/Output Summary (Last 24 hours) at 11/03/2022 1523 Last data filed at 11/03/2022 1400 Gross per 24 hour  Intake 900 ml  Output 1250 ml  Net -350 ml   Filed Weights   11/02/22 1054 11/03/22 1057  Weight: 69.9 kg 63.5 kg    Examination:  General exam: Appears calm and comfortable  Respiratory system: Clear to auscultation. Respiratory effort normal. Cardiovascular system: S1 & S2 heard, RRR. No JVD, murmurs, rubs, gallops or clicks. No pedal edema. Gastrointestinal system: Abdomen is nondistended, soft and nontender. No organomegaly or masses felt. Normal bowel sounds heard. Central nervous system: Alert and oriented. No focal neurological deficits. Extremities: right hip bandaged, sensation intact Skin: No rashes  Psychiatry: Judgement and insight appear normal. Mood & affect appropriate.     Data Reviewed: I have personally reviewed following labs and imaging studies  CBC: Recent Labs  Lab 11/02/22 1206 11/03/22 0429  WBC 11.2* 5.7  NEUTROABS  --  4.2  HGB 14.4 13.2  HCT 44.3 40.1  MCV  100.9* 99.5  PLT 246 517   Basic Metabolic Panel: Recent Labs  Lab 11/02/22 1206  NA 135  K 3.9  CL 103  CO2 25  GLUCOSE 92  BUN 22  CREATININE 0.68  CALCIUM 8.6*   GFR: Estimated Creatinine Clearance: 54.9 mL/min (by C-G formula based on SCr of 0.68 mg/dL). Liver Function Tests: No results for input(s): "AST", "ALT", "ALKPHOS", "BILITOT", "PROT", "ALBUMIN" in the last 168 hours. No results for input(s): "LIPASE", "AMYLASE" in the last 168 hours. No results for input(s):  "AMMONIA" in the last 168 hours. Coagulation Profile: Recent Labs  Lab 11/02/22 1206  INR 1.0   Cardiac Enzymes: No results for input(s): "CKTOTAL", "CKMB", "CKMBINDEX", "TROPONINI" in the last 168 hours. BNP (last 3 results) No results for input(s): "PROBNP" in the last 8760 hours. HbA1C: No results for input(s): "HGBA1C" in the last 72 hours. CBG: No results for input(s): "GLUCAP" in the last 168 hours. Lipid Profile: No results for input(s): "CHOL", "HDL", "LDLCALC", "TRIG", "CHOLHDL", "LDLDIRECT" in the last 72 hours. Thyroid Function Tests: No results for input(s): "TSH", "T4TOTAL", "FREET4", "T3FREE", "THYROIDAB" in the last 72 hours. Anemia Panel: No results for input(s): "VITAMINB12", "FOLATE", "FERRITIN", "TIBC", "IRON", "RETICCTPCT" in the last 72 hours. Urine analysis:    Component Value Date/Time   COLORURINE YELLOW 09/03/2022 1558   APPEARANCEUR HAZY (A) 09/03/2022 1558   LABSPEC 1.023 09/03/2022 1558   LABSPEC 1.010 02/18/2017 1521   PHURINE 5.0 09/03/2022 1558   GLUCOSEU NEGATIVE 09/03/2022 1558   GLUCOSEU Negative 02/18/2017 1521   HGBUR NEGATIVE 09/03/2022 1558   BILIRUBINUR NEGATIVE 09/03/2022 1558   BILIRUBINUR negative 11/27/2021 0908   BILIRUBINUR Negative 02/18/2017 1521   KETONESUR NEGATIVE 09/03/2022 1558   PROTEINUR NEGATIVE 09/03/2022 1558   UROBILINOGEN 0.2 11/27/2021 0908   UROBILINOGEN 0.2 02/18/2017 1521   NITRITE NEGATIVE 09/03/2022 1558   LEUKOCYTESUR TRACE (A) 09/03/2022 1558   LEUKOCYTESUR Small 02/18/2017 1521   Sepsis Labs: '@LABRCNTIP'$ (procalcitonin:4,lacticidven:4)  ) Recent Results (from the past 240 hour(s))  Surgical pcr screen     Status: None   Collection Time: 11/02/22  2:46 PM   Specimen: Nasal Mucosa; Nasal Swab  Result Value Ref Range Status   MRSA, PCR NEGATIVE NEGATIVE Final   Staphylococcus aureus NEGATIVE NEGATIVE Final    Comment: (NOTE) The Xpert SA Assay (FDA approved for NASAL specimens in patients 68 years  of age and older), is one component of a comprehensive surveillance program. It is not intended to diagnose infection nor to guide or monitor treatment. Performed at [facility], [address], [place], [place] [address]          Radiology Studies: DG HIP UNILAT WITH PELVIS 1V RIGHT  Result Date: 11/03/2022 CLINICAL DATA:  Status post right total hip arthroplasty. EXAM: DG HIP (WITH OR WITHOUT PELVIS) 1V RIGHT COMPARISON:  11/02/2022 FINDINGS: Two images from portable C-arm radiography obtained in the operating room show placement of a right hip arthroplasty device. The hardware components are in anatomic alignment. No periprosthetic fracture or subluxation. IMPRESSION: Status post right hip arthroplasty. Electronically Signed   By: Kerby Moors M.D.   On: 11/03/2022 13:27   DG C-Arm 1-60 Min-No Report  Result Date: 11/03/2022 Fluoroscopy was utilized by the requesting physician.  No radiographic interpretation.   DG Chest 1 View  Result Date: 11/02/2022 CLINICAL DATA:  [place] with right hip pain EXAM: CHEST  1 VIEW COMPARISON:  09/11/2013 FINDINGS: Heart size is normal. Mediastinal shadows are normal. Power port  on the right has its tip in the SVC at the azygos level. The lungs are clear. No effusions. No acute bone finding. IMPRESSION: No active disease. Power port tip in the SVC at the azygos level. Electronically Signed   By: Nelson Chimes M.D.   On: 11/02/2022 11:41   DG Hip Unilat  With Pelvis 2-3 Views Right  Result Date: 11/02/2022 CLINICAL DATA:  [place] with pain in the right hip and groin. EXAM: DG HIP (WITH OR WITHOUT PELVIS) 2-3V RIGHT COMPARISON:  None Available. FINDINGS: Acute femoral neck fracture on the right with mild angulation. No other regional fracture intertrochanteric region appears intact. IMPRESSION: Acute femoral neck fracture on the right with mild angulation. Electronically Signed   By: Nelson Chimes M.D.   On: 11/02/2022 11:40        Scheduled  Meds:  aspirin  81 mg Oral BID   docusate sodium  100 mg Oral BID   ketorolac  7.5 mg Intravenous Q6H   Continuous Infusions:   ceFAZolin (ANCEF) IV     lactated ringers 75 mL/hr at 11/03/22 1503     LOS: 1 day     Desma Maxim, MD [facility]   If 7PM-7AM, please contact night-coverage [email or web address] Password [facility] 11/03/2022, 3:23 PM

## 2022-11-03 NOTE — Anesthesia Procedure Notes (Signed)
Spinal  Patient location during procedure: OR Start time: 11/03/2022 11:40 AM End time: 11/03/2022 11:53 AM Reason for block: surgical anesthesia Staffing Performed: resident/CRNA  Resident/CRNA: Cammie Sickle, CRNA Performed by: Cammie Sickle, CRNA Authorized by: [place] [facility], Jane Canary, MD   Preanesthetic Checklist Completed: patient identified, IV checked, site marked, risks and benefits discussed, surgical consent, monitors and equipment checked, pre-op evaluation and timeout performed Spinal Block Patient position: right lateral decubitus Prep: ChloraPrep Patient monitoring: heart rate, continuous pulse ox and blood pressure Approach: right paramedian Location: L4-5 Injection technique: single-shot Needle Needle type: Introducer and Pencan  Needle gauge: 24 G Needle length: 9 cm Assessment Sensory level: T6 Events: CSF return Additional Notes Negative paresthesia. Negative blood return. Positive free-flowing CSF. Expiration date of kit checked and confirmed. Patient tolerated procedure well, without complications. First attempt midline, hitting OS and unsuccessful, no complications noted. Second attempt at same level, utilizing paramedian approach successful. No complications noted. Pt. Tolerated procedure well.

## 2022-11-03 NOTE — Anesthesia Preprocedure Evaluation (Signed)
Anesthesia Evaluation  Patient identified by MRN, date of birth, ID band Patient awake    Reviewed: Allergy & Precautions, NPO status , Patient's Chart, lab work & pertinent test results  Airway Mallampati: III  TM Distance: >3 FB Neck ROM: Full    Dental  (+) Teeth Intact   Pulmonary neg pulmonary ROS   Pulmonary exam normal breath sounds clear to auscultation       Cardiovascular Exercise Tolerance: Good negative cardio ROS Normal cardiovascular exam Rhythm:Regular Rate:Normal     Neuro/Psych   Anxiety Depression    negative neurological ROS  negative psych ROS   GI/Hepatic negative GI ROS, Neg liver ROS,,,  Endo/Other  negative endocrine ROS  Morbid obesity  Renal/GU      Musculoskeletal   Abdominal  (+) + obese  Peds negative pediatric ROS (+)  Hematology negative hematology ROS (+)   Anesthesia Other Findings Past Medical History:  : Anxiety and depression No date: DJD (degenerative joint disease) No date: Elevated cholesterol No date: Endometrial polyp No date: Family history of bladder cancer No date: Family history of lymphoma 10/25/16-12/01/16: History of radiation therapy     Comment:  right pelvis/60.2 Gy in 28 fractions 05/15/2008: IRRITABLE BOWEL SYNDROME, HX OF No date: Kidney stone No date: MVP (mitral valve prolapse)     Comment:  occ palpitations  07/2012: Ovarian ca ([place])     Comment:    ~ 2010: Rheumatoid arthritis(714.0)     Comment:  dr Ouida Sills No date: Shingles  Past Surgical History: 08/22/2012: ABDOMINAL HYSTERECTOMY     Comment:  Procedure: HYSTERECTOMY ABDOMINAL;  Surgeon: Alvino Chapel, MD;  Location: [facility];  Service:               Gynecology;  Laterality: N/A; 10/04/2006: BACK SURGERY     Comment:  Dr Trenton Gammon 04/20/2004: COLONOSCOPY 07/04/2014: COLONOSCOPY W/ BIOPSIES 08/22/2012: COLOSTOMY REVISION     Comment:  Procedure: COLON RESECTION  SIGMOID;  Surgeon: Alvino Chapel, MD;  Location: [facility];  Service:               Gynecology;;  Rectal Sigmoid resection and low rectal               anastomosis 10/04/2009: CYST ON NECK 08/22/2012: DEBULKING     Comment:  Procedure: DEBULKING;  Surgeon: Alvino Chapel,              MD;  Location: [facility];  Service: Gynecology;  Laterality:              N/A;  Radical tumor debulking, Bilateral Ureterolysis 10/04/2002: DILATION AND CURETTAGE OF UTERUS     Comment:  WITH HYSTEROSCOPY 10/04/1994: HAND SURGERY 10/04/2002: HYSTEROSCOPY     Comment:  D&C 09/01/2021: IR IMAGING GUIDED PORT INSERTION No date: KIDNEY STONE SURGERY; Right     Comment:  with stent placement  Mar 03 2021 10/05/2007: NECK SURGERY     Comment:  SPURS 08/22/2012: OMENTECTOMY     Comment:  Procedure: OMENTECTOMY;  Surgeon: Alvino Chapel, MD;  Location: [facility];  Service:               Gynecology;  Laterality: N/A; 08/22/2012: SALPINGOOPHORECTOMY     Comment:  Procedure: SALPINGO  OOPHORECTOMY;  Surgeon: Alvino Chapel, MD;  Location: [facility];  Service:               Gynecology;  Laterality: Bilateral; 10/04/1978: TUBAL LIGATION  BMI    Body Mass Index: 24.03 kg/m      Reproductive/Obstetrics negative OB ROS                             Anesthesia Physical Anesthesia Plan  ASA: 2  Anesthesia Plan: Spinal   Post-op Pain Management:    Induction: Intravenous  PONV Risk Score and Plan:   Airway Management Planned: Natural Airway and Nasal Cannula  Additional Equipment:   Intra-op Plan:   Post-operative Plan:   Informed Consent: I have reviewed the patients History and Physical, chart, labs and discussed the procedure including the risks, benefits and alternatives for the proposed anesthesia with the patient or authorized representative who has indicated his/her understanding and acceptance.      Dental Advisory Given  Plan Discussed with: CRNA and Surgeon  Anesthesia Plan Comments:        Anesthesia Quick Evaluation

## 2022-11-03 NOTE — Transfer of Care (Signed)
Immediate Anesthesia Transfer of Care Note  Patient: [name] [name]  Procedure(s) Performed: ANTERIOR APPROACH HEMI HIP ARTHROPLASTY (Right)  Patient Location: PACU  Anesthesia Type:Spinal  Level of Consciousness: awake, alert , and oriented  Airway & Oxygen Therapy: Patient Spontanous Breathing  Post-op Assessment: Report given to RN and Post -op Vital signs reviewed and stable  Post vital signs: Reviewed and stable  Last Vitals:  Vitals Value Taken Time  BP 102/71 11/03/22 1342  Temp 35.9 1342  Pulse 50 11/03/22 1345  Resp 13 11/03/22 1345  SpO2 94 % 11/03/22 1345  Vitals shown include unvalidated device data.  Last Pain:  Vitals:   11/03/22 1057  TempSrc: Oral  PainSc: 4       Patients Stated Pain Goal: 2 (39/03/00 9233)  Complications: No notable events documented.

## 2022-11-04 DIAGNOSIS — S72141A Displaced intertrochanteric fracture of right femur, initial encounter for closed fracture: Secondary | ICD-10-CM | POA: Diagnosis not present

## 2022-11-04 LAB — BASIC METABOLIC PANEL WITH GFR
Anion gap: 8 (ref 5–15)
BUN: 39 mg/dL — ABNORMAL HIGH (ref 8–23)
CO2: 23 mmol/L (ref 22–32)
Calcium: 8.8 mg/dL — ABNORMAL LOW (ref 8.9–10.3)
Chloride: 106 mmol/L (ref 98–111)
Creatinine, Ser: 0.82 mg/dL (ref 0.44–1.00)
GFR, Estimated: >60 mL/min
Glucose, Bld: 136 mg/dL — ABNORMAL HIGH (ref 70–99)
Potassium: 4.4 mmol/L (ref 3.5–5.1)
Sodium: 137 mmol/L (ref 135–145)

## 2022-11-04 LAB — CBC
HCT: 38.4 % (ref 36.0–46.0)
Hemoglobin: 12.4 g/dL (ref 12.0–15.0)
MCH: 32.5 pg (ref 26.0–34.0)
MCHC: 32.3 g/dL (ref 30.0–36.0)
MCV: 100.8 fL — ABNORMAL HIGH (ref 80.0–100.0)
Platelets: 198 10*3/uL (ref 150–400)
RBC: 3.81 MIL/uL — ABNORMAL LOW (ref 3.87–5.11)
RDW: 14.1 % (ref 11.5–15.5)
WBC: 9.2 10*3/uL (ref 4.0–10.5)
nRBC: 0 % (ref 0.0–0.2)

## 2022-11-04 MED ORDER — ASPIRIN 81 MG PO CHEW: 81.0000 mg | CHEWABLE_TABLET | Freq: Two times a day (BID) | ORAL | 0 refills | Status: DC

## 2022-11-04 MED ORDER — HYDROCODONE-ACETAMINOPHEN 7.5-325 MG PO TABS: 1.0000 | ORAL_TABLET | ORAL | 0 refills | Status: DC | PRN

## 2022-11-04 MED ORDER — DOCUSATE SODIUM 100 MG PO CAPS: 100.0000 mg | ORAL_CAPSULE | Freq: Two times a day (BID) | ORAL | 0 refills | Status: DC

## 2022-11-04 NOTE — Discharge Summary (Signed)
[name] [name] EVO:[id number] DOB: 11-Jan-1951 DOA: 11/02/2022  PCP: Michela Pitcher, NP  Admit date: 11/02/2022 Discharge date: 11/04/2022  Time spent: 35 minutes  Recommendations for Outpatient Follow-up:  Ortho f/u 2 weeks     Discharge Diagnoses:  Principal Problem:   Femur fracture ([place]) Active Problems:   Ovarian cancer ([place])   Rheumatoid arthritis ([place])   Discharge Condition: stable  Diet recommendation: heart healthy  Filed Weights   11/02/22 1054 11/03/22 1057  Weight: 69.9 kg 63.5 kg    History of present illness:  From admission h and p [name] [name] is a 72 y.o. female with medical history significant of ovarian cancer on chemotherapy, rheumatoid arthritis not on DMARDs, anxiety/depression, hyperlipidemia, osteopenia who presents to the ED with complaints of hip pain.   [name] [name] states that she was in her usual state of health today when she was walking outside, and tripped on her own feet.  She denies any symptoms prior to tripping.  She fell and landed onto her right hip and right knee.  She denies hitting her head or loss of consciousness.  After falling, she did become dizzy and felt very hot and this lasted for approximately 5 minutes.  Symptoms have not reoccurred.  She denies any shortness of breath, chest pain, palpitations, nausea, vomiting, diarrhea, abdominal pain.  Since falling, she has not been able to bear weight.  Hospital Course:  Presented after a fall with right hip pain, found to have displaced subcapital fracture of the right hip treated with anterior hemiarthroplasty on 1/31 with Dr. Harlow Mares. No complications. Pain is controlled, tolerating diet, urinating, worked well with PT who advises HH PT. Post-op hgb 12.4. Discharging with asa for dvt ppx per orthopedics, ortho f/u 2 weeks. Advise PCP f/u for discussion of osteoporosis treatment.   Procedures: See above   Consultations: orthopedics  Discharge Exam: Vitals:   11/04/22 0447 11/04/22  0803  BP: 123/72 108/73  Pulse: 70 67  Resp: 16 15  Temp: (!) 97.5 F (36.4 C) 97.6 F (36.4 C)  SpO2: 97% 95%    General exam: Appears calm and comfortable  Respiratory system: Clear to auscultation. Respiratory effort normal. Cardiovascular system: S1 & S2 heard, RRR. No JVD, murmurs, rubs, gallops or clicks. No pedal edema. Gastrointestinal system: Abdomen is nondistended, soft and nontender. No organomegaly or masses felt. Normal bowel sounds heard. Central nervous system: Alert and oriented. No focal neurological deficits. Extremities: right hip bandaged, sensation intact Skin: No rashes  Psychiatry: Judgement and insight appear normal. Mood & affect appropriate.     Discharge Instructions   Discharge Instructions     Diet - low sodium heart healthy   Complete by: As directed    Increase activity slowly   Complete by: As directed       Allergies as of 11/04/2022       Reactions   Atorvastatin Other (See Comments)   Leg pain   Macrodantin Other (See Comments)   Unknown Reaction   Decongest-aid [pseudoephedrine] Palpitations        Medication List     STOP taking these medications    lidocaine-prilocaine cream Commonly known as: EMLA   ondansetron 8 MG tablet Commonly known as: ZOFRAN   prochlorperazine 10 MG tablet Commonly known as: COMPAZINE       TAKE these medications    aspirin 81 MG chewable tablet Chew 1 tablet (81 mg total) by mouth 2 (two) times daily.   docusate sodium 100 MG  capsule Commonly known as: COLACE Take 1 capsule (100 mg total) by mouth 2 (two) times daily.   fish oil-omega-3 fatty acids 1000 MG capsule Take 1 g by mouth daily.   HYDROcodone-acetaminophen 7.5-325 MG tablet Commonly known as: NORCO Take 1 tablet by mouth every 4 (four) hours as needed for severe pain (pain score 7-10).   multivitamin with minerals Tabs tablet Take 1 tablet by mouth daily.   Red Yeast Rice Extract 600 MG Caps Take 1 capsule by mouth  daily.               Durable Medical Equipment  (From admission, onward)           Start     Ordered   11/04/22 1014  For home use only DME 3 n 1  Once        11/04/22 1013           Allergies  Allergen Reactions   Atorvastatin Other (See Comments)    Leg pain   Macrodantin Other (See Comments)    Unknown Reaction   Decongest-Aid [Pseudoephedrine] Palpitations    Follow-up Information     Lovell Sheehan, MD Follow up.   Specialty: Orthopedic Surgery Why: 2 weeks Contact information: [address] [place] [id number] [phone]                  The results of significant diagnostics from this hospitalization (including imaging, microbiology, ancillary and laboratory) are listed below for reference.    Significant Diagnostic Studies: DG HIP UNILAT WITH PELVIS 1V RIGHT  Result Date: 11/03/2022 CLINICAL DATA:  Status post right total hip arthroplasty. EXAM: DG HIP (WITH OR WITHOUT PELVIS) 1V RIGHT COMPARISON:  11/02/2022 FINDINGS: Two images from portable C-arm radiography obtained in the operating room show placement of a right hip arthroplasty device. The hardware components are in anatomic alignment. No periprosthetic fracture or subluxation. IMPRESSION: Status post right hip arthroplasty. Electronically Signed   By: Kerby Moors M.D.   On: 11/03/2022 13:27   DG C-Arm 1-60 Min-No Report  Result Date: 11/03/2022 Fluoroscopy was utilized by the requesting physician.  No radiographic interpretation.   DG Chest 1 View  Result Date: 11/02/2022 CLINICAL DATA:  [place] with right hip pain EXAM: CHEST  1 VIEW COMPARISON:  09/11/2013 FINDINGS: Heart size is normal. Mediastinal shadows are normal. Power port on the right has its tip in the SVC at the azygos level. The lungs are clear. No effusions. No acute bone finding. IMPRESSION: No active disease. Power port tip in the SVC at the azygos level. Electronically Signed   By: Nelson Chimes M.D.   On:  11/02/2022 11:41   DG Hip Unilat  With Pelvis 2-3 Views Right  Result Date: 11/02/2022 CLINICAL DATA:  [place] with pain in the right hip and groin. EXAM: DG HIP (WITH OR WITHOUT PELVIS) 2-3V RIGHT COMPARISON:  None Available. FINDINGS: Acute femoral neck fracture on the right with mild angulation. No other regional fracture intertrochanteric region appears intact. IMPRESSION: Acute femoral neck fracture on the right with mild angulation. Electronically Signed   By: Nelson Chimes M.D.   On: 11/02/2022 11:40    Microbiology: Recent Results (from the past 240 hour(s))  Surgical pcr screen     Status: None   Collection Time: 11/02/22  2:46 PM   Specimen: Nasal Mucosa; Nasal Swab  Result Value Ref Range Status   MRSA, PCR NEGATIVE NEGATIVE Final   Staphylococcus aureus NEGATIVE NEGATIVE Final  Comment: (NOTE) The Xpert SA Assay (FDA approved for NASAL specimens in patients 62 years of age and older), is one component of a comprehensive surveillance program. It is not intended to diagnose infection nor to guide or monitor treatment. Performed at [facility], [address] [place], [place] [address]      Labs: Basic Metabolic Panel: Recent Labs  Lab 11/02/22 1206 11/04/22 0558  NA 135 137  K 3.9 4.4  CL 103 106  CO2 25 23  GLUCOSE 92 136*  BUN 22 39*  CREATININE 0.68 0.82  CALCIUM 8.6* 8.8*   Liver Function Tests: No results for input(s): "AST", "ALT", "ALKPHOS", "BILITOT", "PROT", "ALBUMIN" in the last 168 hours. No results for input(s): "LIPASE", "AMYLASE" in the last 168 hours. No results for input(s): "AMMONIA" in the last 168 hours. CBC: Recent Labs  Lab 11/02/22 1206 11/03/22 0429 11/04/22 0558  WBC 11.2* 5.7 9.2  NEUTROABS  --  4.2  --   HGB 14.4 13.2 12.4  HCT 44.3 40.1 38.4  MCV 100.9* 99.5 100.8*  PLT 246 217 198   Cardiac Enzymes: No results for input(s): "CKTOTAL", "CKMB", "CKMBINDEX", "TROPONINI" in the last 168 hours. BNP: BNP (last 3  results) No results for input(s): "BNP" in the last 8760 hours.  ProBNP (last 3 results) No results for input(s): "PROBNP" in the last 8760 hours.  CBG: No results for input(s): "GLUCAP" in the last 168 hours.     Signed:  Desma Maxim MD.  [facility] 11/04/2022, 11:07 AM

## 2022-11-04 NOTE — TOC Progression Note (Signed)
Transition of Care [facility]) - Progression Note    Patient Details  Name: [name] [name] MRN: [id number] Date of Birth: December 10, 1950  Transition of Care [facility]) CM/SW [place], RN Phone Number: 11/04/2022, 11:33 AM  Clinical Narrative:   spoke with the patient and provided the cost of the tub bench as Approx 85$, she stated that she does not want to get it I notified [employer] and asked for the proce of the 3 in 1    Expected Discharge Plan: [place] Barriers to Discharge: No Barriers Identified  Expected Discharge Plan and Services   Discharge Planning Services: CM Consult   Living arrangements for the past 2 months: Single Family Home Expected Discharge Date: 11/04/22               DME Arranged: 3-N-1, Tub bench DME Agency: [employer] Date DME Agency Contacted: 11/04/22 Time DME Agency Contacted: [phone]   [place] Arranged: PT, OT HH Agency: [place] Date [facility] Agency Contacted: 11/04/22 Time [place]: [phone] Representative spoke with at [place]: [place] Determinants of Health ([place]) Interventions SDOH Screenings   Food Insecurity: No Food Insecurity (11/02/2022)  Housing: Low Risk  (11/02/2022)  Transportation Needs: No Transportation Needs (11/02/2022)  Utilities: Not At Risk (11/02/2022)  Alcohol Screen: Low Risk  (11/17/2021)  Depression (PHQ2-9): Low Risk  (03/08/2022)  Financial Resource Strain: Low Risk  (11/17/2021)  Physical Activity: Insufficiently Active (11/17/2021)  Social Connections: Moderately Isolated (11/17/2021)  Stress: Stress Concern Present (11/17/2021)  Tobacco Use: Low Risk  (11/03/2022)    Readmission Risk Interventions     No data to display

## 2022-11-04 NOTE — TOC Progression Note (Signed)
Transition of Care [facility]) - Progression Note    Patient Details  Name: [name] [name] MRN: [id number] Date of Birth: 16-Jul-1951  Transition of Care [facility]) CM/SW [place], RN Phone Number: 11/04/2022, 10:17 AM  Clinical Narrative:    Reached out to Suanne Marker at [employer] to get the price of a tub transfer bench , awaiting a response with a prive        Expected Discharge Plan and Services                                               Social Determinants of Health (SDOH) Interventions SDOH Screenings   Food Insecurity: No Food Insecurity (11/02/2022)  Housing: Low Risk  (11/02/2022)  Transportation Needs: No Transportation Needs (11/02/2022)  Utilities: Not At Risk (11/02/2022)  Alcohol Screen: Low Risk  (11/17/2021)  Depression (PHQ2-9): Low Risk  (03/08/2022)  Financial Resource Strain: Low Risk  (11/17/2021)  Physical Activity: Insufficiently Active (11/17/2021)  Social Connections: Moderately Isolated (11/17/2021)  Stress: Stress Concern Present (11/17/2021)  Tobacco Use: Low Risk  (11/03/2022)    Readmission Risk Interventions     No data to display

## 2022-11-04 NOTE — Progress Notes (Cosign Needed)
Patient is not able to walk the distance required to go the bathroom, or he/she is unable to safely negotiate stairs required to access the bathroom.  A 3in1 BSC will alleviate this problem  

## 2022-11-04 NOTE — Progress Notes (Signed)
Subjective:  Patient reports pain as mild to moderate.    Objective:   VITALS:   Vitals:   11/03/22 1527 11/03/22 2000 11/04/22 0002 11/04/22 0447  BP: 105/66 120/71 108/73 123/72  Pulse: 75 (!) 103 83 70  Resp: '16 16 16 16  '$ Temp: 97.8 F (36.6 C) 98.1 F (36.7 C) (!) 97.5 F (36.4 C) (!) 97.5 F (36.4 C)  TempSrc: Oral     SpO2: 99% 93% 95% 97%  Weight:      Height:        PHYSICAL EXAM:  Neurologically intact ABD soft Neurovascular intact Sensation intact distally Intact pulses distally Dorsiflexion/Plantar flexion intact Incision: dressing C/D/I No cellulitis present Compartment soft  LABS  Results for orders placed or performed during the hospital encounter of 11/02/22 (from the past 24 hour(s))  Type and screen [place]     Status: None   Collection Time: 11/03/22 11:07 AM  Result Value Ref Range   ABO/RH(D) A POS    Antibody Screen NEG    Sample Expiration      11/06/2022,2359 Performed at [facility], [address] [place], [place] [address]   CBC     Status: Abnormal   Collection Time: 11/04/22  5:58 AM  Result Value Ref Range   WBC 9.2 4.0 - 10.5 K/uL   RBC 3.81 (L) 3.87 - 5.11 MIL/uL   Hemoglobin 12.4 12.0 - 15.0 g/dL   HCT 38.4 36.0 - 46.0 %   MCV 100.8 (H) 80.0 - 100.0 fL   MCH 32.5 26.0 - 34.0 pg   MCHC 32.3 30.0 - 36.0 g/dL   RDW 14.1 11.5 - 15.5 %   Platelets 198 150 - 400 K/uL   nRBC 0.0 0.0 - 0.2 %    DG HIP UNILAT WITH PELVIS 1V RIGHT  Result Date: 11/03/2022 CLINICAL DATA:  Status post right total hip arthroplasty. EXAM: DG HIP (WITH OR WITHOUT PELVIS) 1V RIGHT COMPARISON:  11/02/2022 FINDINGS: Two images from portable C-arm radiography obtained in the operating room show placement of a right hip arthroplasty device. The hardware components are in anatomic alignment. No periprosthetic fracture or subluxation. IMPRESSION: Status post right hip arthroplasty. Electronically Signed   By: Kerby Moors M.D.   On: 11/03/2022 13:27   DG C-Arm 1-60 Min-No Report  Result Date: 11/03/2022 Fluoroscopy was utilized by the requesting physician.  No radiographic interpretation.   DG Chest 1 View  Result Date: 11/02/2022 CLINICAL DATA:  [place] with right hip pain EXAM: CHEST  1 VIEW COMPARISON:  09/11/2013 FINDINGS: Heart size is normal. Mediastinal shadows are normal. Power port on the right has its tip in the SVC at the azygos level. The lungs are clear. No effusions. No acute bone finding. IMPRESSION: No active disease. Power port tip in the SVC at the azygos level. Electronically Signed   By: Nelson Chimes M.D.   On: 11/02/2022 11:41   DG Hip Unilat  With Pelvis 2-3 Views Right  Result Date: 11/02/2022 CLINICAL DATA:  [place] with pain in the right hip and groin. EXAM: DG HIP (WITH OR WITHOUT PELVIS) 2-3V RIGHT COMPARISON:  None Available. FINDINGS: Acute femoral neck fracture on the right with mild angulation. No other regional fracture intertrochanteric region appears intact. IMPRESSION: Acute femoral neck fracture on the right with mild angulation. Electronically Signed   By: Nelson Chimes M.D.   On: 11/02/2022 11:40    Assessment/Plan: 1 Day Post-Op   Principal Problem:  Femur fracture (H[facility]) Active Problems:   Rheumatoid arthritis ([place])   Ovarian cancer ([place])   Up with therapy Hydrocodone prn for pain Aspirin 81 mg twice daily for 30 days WBAT Discharge per medicine Follow up in office in 2 weeks ( FEB 13 or 14 with Carlynn Spry PA-C  ) call to confirm appointment [place] [id number]  Carlynn Spry , PA-C 11/04/2022, 7:08 AM

## 2022-11-04 NOTE — TOC Progression Note (Addendum)
Transition of Care [facility]) - Progression Note    Patient Details  Name: [name] [name] MRN: [id number] Date of Birth: 08-Jul-1951  Transition of Care [facility]) CM/SW [place], RN Phone Number: 11/04/2022, 10:50 AM  Clinical Narrative:     Met with the patient She will have transportation with her husband She has a RW and cane at home and needs a 3 in 1 and transfer tub bench To be delivered by [employer] to the bedside Rhonda aware Alvis Lemmings accepted for [facility] She has not preference for agancies  Expected Discharge Plan: [place] Barriers to Discharge: No Barriers Identified  Expected Discharge Plan and Services   Discharge Planning Services: CM Consult   Living arrangements for the past 2 months: Single Family Home                 DME Arranged: 3-N-1, Tub bench DME Agency: [employer] Date DME Agency Contacted: 11/04/22 Time DME Agency Contacted: [phone]   [place] Arranged: PT, OT HH Agency: [place] Date [facility] Agency Contacted: 11/04/22 Time [place]: [phone] Representative spoke with at [place]: [place] Determinants of Health ([place]) Interventions SDOH Screenings   Food Insecurity: No Food Insecurity (11/02/2022)  Housing: Low Risk  (11/02/2022)  Transportation Needs: No Transportation Needs (11/02/2022)  Utilities: Not At Risk (11/02/2022)  Alcohol Screen: Low Risk  (11/17/2021)  Depression (PHQ2-9): Low Risk  (03/08/2022)  Financial Resource Strain: Low Risk  (11/17/2021)  Physical Activity: Insufficiently Active (11/17/2021)  Social Connections: Moderately Isolated (11/17/2021)  Stress: Stress Concern Present (11/17/2021)  Tobacco Use: Low Risk  (11/03/2022)    Readmission Risk Interventions     No data to display

## 2022-11-04 NOTE — Anesthesia Postprocedure Evaluation (Signed)
Anesthesia Post Note  Patient: [name] [name]  Procedure(s) Performed: ANTERIOR APPROACH HEMI HIP ARTHROPLASTY (Right)  Patient location during evaluation: Nursing Unit Anesthesia Type: Spinal Level of consciousness: oriented and awake and alert Pain management: pain level controlled Vital Signs Assessment: post-procedure vital signs reviewed and stable Respiratory status: spontaneous breathing and respiratory function stable Cardiovascular status: blood pressure returned to baseline and stable Postop Assessment: no headache, no backache, no apparent nausea or vomiting and patient able to bend at knees Anesthetic complications: no  No notable events documented.   Last Vitals:  Vitals:   11/04/22 0002 11/04/22 0447  BP: 108/73 123/72  Pulse: 83 70  Resp: 16 16  Temp: (!) 36.4 C (!) 36.4 C  SpO2: 95% 97%    Last Pain:  Vitals:   11/03/22 2000  TempSrc:   PainSc: 0-No pain                 Blima Singer

## 2022-11-04 NOTE — Evaluation (Signed)
Physical Therapy Evaluation Patient Details Name: [name] [name] MRN: [id number] DOB: 1951-08-06 Today's Date: 11/04/2022  History of Present Illness  72 y.o. female with medical history significant of ovarian cancer on chemotherapy, rheumatoid arthritis not on DMARDs, anxiety/depression, hyperlipidemia, osteopenia who presents to the ED after fall.  Found to have R hip fx with subsequent total hip replacement 11/03/22 (anterior approach).  Clinical Impression  Pt initially with some expected pain hesitancy, but ultimately this was not a huge factor and she did quite well.  She was able to perform bed mobility and transfer to standing w/o physical assist and was able to circumambulate nurses' station with walker and guarded but consistent cadence.  Pt progressing well POD1, will need continued PT per total hip protocol to address functional limitations.       Recommendations for follow up therapy are one component of a multi-disciplinary discharge planning process, led by the attending physician.  Recommendations may be updated based on patient status, additional functional criteria and insurance authorization.  Follow Up Recommendations Home health PT      Assistance Recommended at Discharge Set up Supervision/Assistance  Patient can return home with the following  A little help with walking and/or transfers;A little help with bathing/dressing/bathroom;Assistance with cooking/housework;Assist for transportation;Help with stairs or ramp for entrance    Equipment Recommendations BSC/3in1 (has FWW)  Recommendations for Other Services       Functional Status Assessment Patient has had a recent decline in their functional status and demonstrates the ability to make significant improvements in function in a reasonable and predictable amount of time.     Precautions / Restrictions Precautions Precautions: Anterior Hip;Fall Precaution Booklet Issued: Yes (comment) Restrictions RLE Weight  Bearing: Weight bearing as tolerated      Mobility  Bed Mobility Overal bed mobility: Modified Independent             General bed mobility comments: With modest UE use on rails pt was able to get to sitting EOB w/o assist or hesitation    Transfers Overall transfer level: Modified independent Equipment used: Rolling walker (2 wheels)               General transfer comment: Pt was able to position herself and shift weight forward to standing w/o phyiscal assist, good safety awareness.    Ambulation/Gait Ambulation/Gait assistance: Min guard Gait Distance (Feet): 200 Feet Assistive device: Rolling walker (2 wheels)         General Gait Details: Pt was able to maintain consistent forward momentum and cadence.  With cuing showed improved R hip/knee mechanics but did have low grade limp t/o the effort.  Pt with no LOBs or overt safety issues, moderate fatigue with the prolonged effort.  Stairs            Wheelchair Mobility    Modified Rankin (Stroke Patients Only)       Balance Overall balance assessment: Modified Independent                                           Pertinent Vitals/Pain Pain Assessment Pain Assessment: 0-10 Pain Score: 3  Pain Location: R hip, increases with movement    Home Living Family/patient expects to be discharged to:: Private residence Living Arrangements: Children Available Help at Discharge: Family;Available 24 hours/day   Home Access: Stairs to enter   [employer] of Steps: 2  Home Layout: One level Home Equipment: Conservation officer, nature (2 wheels);Cane - single point      Prior Function Prior Level of Function : Independent/Modified Independent                     Hand Dominance        Extremity/Trunk Assessment   Upper Extremity Assessment Upper Extremity Assessment: Overall WFL for tasks assessed    Lower Extremity Assessment Lower Extremity Assessment: Overall WFL  for tasks assessed (expected post-op weakness, grossly 3+ to 4-/5 on R except unable to SLR)       Communication   Communication: No difficulties  Cognition Arousal/Alertness: Awake/alert Behavior During Therapy: WFL for tasks assessed/performed Overall Cognitive Status: Within Functional Limits for tasks assessed                                          General Comments General comments (skin integrity, edema, etc.): Pt did very well POD1, good effort and activity tolerance/safety.    Exercises Total Joint Exercises Ankle Circles/Pumps: Strengthening, 10 reps Quad Sets: Strengthening, 10 reps Short Arc Quad: Strengthening, 10 reps Heel Slides: AROM, 10 reps (with resisted leg ext) Hip ABduction/ADduction: Strengthening, 10 reps Straight Leg Raises: AAROM (unable w/o a lot of assist and pain, deferred after 2 reps)   Assessment/Plan    PT Assessment Patient needs continued PT services  PT Problem List Decreased strength;Decreased range of motion;Decreased activity tolerance;Decreased balance;Decreased mobility;Decreased safety awareness;Pain       PT Treatment Interventions DME instruction;Gait training;Stair training;Functional mobility training;Therapeutic activities;Therapeutic exercise;Balance training;Neuromuscular re-education;Patient/family education    PT Goals (Current goals can be found in the Care Plan section)  Acute Rehab PT Goals Patient Stated Goal: Go home tomorrow PT Goal Formulation: With patient Time For Goal Achievement: 11/17/22 Potential to Achieve Goals: Good    Frequency BID     Co-evaluation               AM-PAC PT "6 Clicks" Mobility  Outcome Measure Help needed turning from your back to your side while in a flat bed without using bedrails?: None Help needed moving from lying on your back to sitting on the side of a flat bed without using bedrails?: None Help needed moving to and from a bed to a chair (including a  wheelchair)?: None Help needed standing up from a chair using your arms (e.g., wheelchair or bedside chair)?: None Help needed to walk in hospital room?: None Help needed climbing 3-5 steps with a railing? : A Little 6 Click Score: 23    End of Session Equipment Utilized During Treatment: Gait belt Activity Tolerance: Patient limited by pain Patient left: with chair alarm set;with call bell/phone within reach Nurse Communication: Mobility status (no need for purewick) PT Visit Diagnosis: Muscle weakness (generalized) (M62.81);Difficulty in walking, not elsewhere classified (R26.2);Pain Pain - Right/Left: Right Pain - part of body: Hip    Time: 7628-3151 PT Time Calculation (min) (ACUTE ONLY): 29 min   Charges:   PT Evaluation $PT Eval Low Complexity: 1 Low PT Treatments $Gait Training: 8-22 mins $Therapeutic Exercise: 8-22 mins        Kreg Shropshire, DPT 11/04/2022, 9:51 AM

## 2022-11-04 NOTE — Evaluation (Signed)
Occupational Therapy Evaluation Patient Details Name: [name] [name] MRN: [id number] DOB: [birth year] Today's Date: 11/04/2022   History of Present Illness Pt is a 72 year old female s/p right hip anterior hip hemiarthroplasty 11/02/22 after a fall; PMH significant for  ovarian cancer on chemotherapy, rheumatoid arthritis not on DMARDs, anxiety/depression, hyperlipidemia, osteopenia   Clinical Impression   Chart reviewed, pt greeted in chair agreeable to OT evaluation. Pt is alert an oriented x4, good awareness of deficits. PTA pt is mod I-I in all ADL/IADL. Pt presents with deficits in activity tolerance, DME knowledge/use affecting safe and optimal ADL completion. Functional mobility completed with RW with supervision, toilet transfer completed with supervision, toileting completed with supervision, grooming standing at sink completed with supervision. Education provided re: LB dressing with reacher for energy conservation techniques. No OT follow up recommended after discharge, OT will continue to follow acutely.      Recommendations for follow up therapy are one component of a multi-disciplinary discharge planning process, led by the attending physician.  Recommendations may be updated based on patient status, additional functional criteria and insurance authorization.   Follow Up Recommendations  No OT follow up     Assistance Recommended at Discharge Intermittent Supervision/Assistance  Patient can return home with the following A little help with bathing/dressing/bathroom;Assist for transportation;Assistance with cooking/housework;Help with stairs or ramp for entrance    Functional Status Assessment  Patient has had a recent decline in their functional status and demonstrates the ability to make significant improvements in function in a reasonable and predictable amount of time.  Equipment Recommendations  BSC/3in1;Tub/shower bench    Recommendations for Other Services        Precautions / Restrictions Precautions Precautions: Anterior Hip;Fall Precaution Booklet Issued: Yes (comment) Restrictions Weight Bearing Restrictions: Yes RLE Weight Bearing: Weight bearing as tolerated      Mobility Bed Mobility               General bed mobility comments: NT in recliner pre/post session    Transfers Overall transfer level: Needs assistance Equipment used: Rolling walker (2 wheels) Transfers: Sit to/from Stand Sit to Stand: Modified independent (Device/Increase time)                  Balance Overall balance assessment: Needs assistance Sitting-balance support: Feet supported Sitting balance-Leahy Scale: Good     Standing balance support: Bilateral upper extremity supported, Reliant on assistive device for balance Standing balance-Leahy Scale: Fair                             ADL either performed or assessed with clinical judgement   ADL Overall ADL's : Needs assistance/impaired Eating/Feeding: Set up;Sitting   Grooming: Wash/dry hands;Standing;Supervision/safety Grooming Details (indicate cue type and reason): vcs for RW technique             Lower Body Dressing: Moderate assistance Lower Body Dressing Details (indicate cue type and reason): simulated Toilet Transfer: Supervision/safety;Rolling walker (2 wheels);BSC/3in1;Ambulation           Functional mobility during ADLs: Supervision/safety;Rolling walker (2 wheels) (approx 40' 2 attempts)       Vision Patient Visual Report: No change from baseline       Perception     Praxis      Pertinent Vitals/Pain Pain Assessment Pain Assessment: 0-10 Pain Score: 4  Pain Location: R hip Pain Descriptors / Indicators: Discomfort Pain Intervention(s): Limited activity within patient's tolerance, Premedicated before session  Hand Dominance     Extremity/Trunk Assessment Upper Extremity Assessment Upper Extremity Assessment: Overall WFL for tasks  assessed   Lower Extremity Assessment Lower Extremity Assessment: RLE deficits/detail RLE Deficits / Details: s/p R hip anterior hip hemiarthroplasty   Cervical / Trunk Assessment Cervical / Trunk Assessment: Normal   Communication Communication Communication: No difficulties   Cognition Arousal/Alertness: Awake/alert Behavior During Therapy: WFL for tasks assessed/performed Overall Cognitive Status: Within Functional Limits for tasks assessed                                       General Comments  vitals monitored, appear stable throughout    Exercises Other Exercises Other Exercises: edu re: role of OT, role of rehab, discharge recommendations, home safety, falls prevention, DME use for safe ADL completion   Shoulder Instructions      Home Living Family/patient expects to be discharged to:: Private residence Living Arrangements: Children Available Help at Discharge: Family;Available 24 hours/day   Home Access: Stairs to enter Entrance Stairs-Number of Steps: 2   Home Layout: One level     Bathroom Shower/Tub: Teacher, early years/pre: Standard     Home Equipment: Conservation officer, nature (2 wheels);Cane - single point, reacher          Prior Functioning/Environment Prior Level of Function : Independent/Modified Independent                        OT Problem List: Decreased knowledge of use of DME or AE;Decreased activity tolerance      OT Treatment/Interventions: Self-care/ADL training;DME and/or AE instruction;Therapeutic exercise;Therapeutic activities    OT Goals(Current goals can be found in the care plan section) Acute Rehab OT Goals Patient Stated Goal: return to PLOF OT Goal Formulation: With patient Time For Goal Achievement: 11/18/22 Potential to Achieve Goals: Good ADL Goals Pt Will Perform Grooming: with modified independence;standing;sitting Pt Will Perform Lower Body Dressing: with modified independence;sit to/from  stand Pt Will Transfer to Toilet: with modified independence;ambulating Pt Will Perform Toileting - Clothing Manipulation and hygiene: with modified independence;sit to/from stand  OT Frequency: Min 2X/week    Co-evaluation              AM-PAC OT "6 Clicks" Daily Activity     Outcome Measure Help from another person eating meals?: None Help from another person taking care of personal grooming?: None Help from another person toileting, which includes using toliet, bedpan, or urinal?: None Help from another person bathing (including washing, rinsing, drying)?: A Little Help from another person to put on and taking off regular upper body clothing?: None Help from another person to put on and taking off regular lower body clothing?: A Little 6 Click Score: 22   End of Session Equipment Utilized During Treatment: Rolling walker (2 wheels) Nurse Communication: Mobility status  Activity Tolerance: Patient tolerated treatment well Patient left: in chair;with call bell/phone within reach;with chair alarm set  OT Visit Diagnosis: Unsteadiness on feet (R26.81);Other abnormalities of gait and mobility (R26.89)                Time: 6073-7106 OT Time Calculation (min): 11 min Charges:  OT General Charges $OT Visit: 1 Visit OT Evaluation $OT Eval Low Complexity: 1 Low Shanon Payor, OTD OTR/L  11/04/22, 10:17 AM

## 2022-11-05 ENCOUNTER — Telehealth: Payer: Self-pay

## 2022-11-05 DIAGNOSIS — Z9049 Acquired absence of other specified parts of digestive tract: Secondary | ICD-10-CM | POA: Diagnosis not present

## 2022-11-05 DIAGNOSIS — M8580 Other specified disorders of bone density and structure, unspecified site: Secondary | ICD-10-CM | POA: Diagnosis not present

## 2022-11-05 DIAGNOSIS — I341 Nonrheumatic mitral (valve) prolapse: Secondary | ICD-10-CM | POA: Diagnosis not present

## 2022-11-05 DIAGNOSIS — Z90710 Acquired absence of both cervix and uterus: Secondary | ICD-10-CM | POA: Diagnosis not present

## 2022-11-05 DIAGNOSIS — C569 Malignant neoplasm of unspecified ovary: Secondary | ICD-10-CM | POA: Diagnosis not present

## 2022-11-05 DIAGNOSIS — Z96641 Presence of right artificial hip joint: Secondary | ICD-10-CM | POA: Diagnosis not present

## 2022-11-05 DIAGNOSIS — W19XXXD Unspecified fall, subsequent encounter: Secondary | ICD-10-CM | POA: Diagnosis not present

## 2022-11-05 DIAGNOSIS — M069 Rheumatoid arthritis, unspecified: Secondary | ICD-10-CM | POA: Diagnosis not present

## 2022-11-05 DIAGNOSIS — Z8619 Personal history of other infectious and parasitic diseases: Secondary | ICD-10-CM | POA: Diagnosis not present

## 2022-11-05 DIAGNOSIS — Z7982 Long term (current) use of aspirin: Secondary | ICD-10-CM | POA: Diagnosis not present

## 2022-11-05 DIAGNOSIS — S72011D Unspecified intracapsular fracture of right femur, subsequent encounter for closed fracture with routine healing: Secondary | ICD-10-CM | POA: Diagnosis not present

## 2022-11-05 DIAGNOSIS — Z9181 History of falling: Secondary | ICD-10-CM | POA: Diagnosis not present

## 2022-11-05 DIAGNOSIS — Z87442 Personal history of urinary calculi: Secondary | ICD-10-CM | POA: Diagnosis not present

## 2022-11-05 DIAGNOSIS — M1990 Unspecified osteoarthritis, unspecified site: Secondary | ICD-10-CM | POA: Diagnosis not present

## 2022-11-05 DIAGNOSIS — K589 Irritable bowel syndrome without diarrhea: Secondary | ICD-10-CM | POA: Diagnosis not present

## 2022-11-05 DIAGNOSIS — E7800 Pure hypercholesterolemia, unspecified: Secondary | ICD-10-CM | POA: Diagnosis not present

## 2022-11-05 DIAGNOSIS — Z923 Personal history of irradiation: Secondary | ICD-10-CM | POA: Diagnosis not present

## 2022-11-05 DIAGNOSIS — F418 Other specified anxiety disorders: Secondary | ICD-10-CM | POA: Diagnosis not present

## 2022-11-05 DIAGNOSIS — Z90722 Acquired absence of ovaries, bilateral: Secondary | ICD-10-CM | POA: Diagnosis not present

## 2022-11-05 LAB — SURGICAL PATHOLOGY

## 2022-11-05 NOTE — Patient Outreach (Signed)
  Care Coordination [facility] Note Transition Care Management Unsuccessful Follow-up Telephone Call  Date of discharge and from where:  [facility] 11/04/22  Attempts:  1st Attempt  Reason for unsuccessful TCM follow-up call:  Left voice message  Johnney Killian, RN, BSN, CCM Care Management Coordinator [facility] Phone: [phone]: [phone]

## 2022-11-07 ENCOUNTER — Other Ambulatory Visit: Payer: Self-pay | Admitting: Oncology

## 2022-11-08 ENCOUNTER — Telehealth: Payer: Self-pay

## 2022-11-08 DIAGNOSIS — Z7982 Long term (current) use of aspirin: Secondary | ICD-10-CM | POA: Diagnosis not present

## 2022-11-08 DIAGNOSIS — C569 Malignant neoplasm of unspecified ovary: Secondary | ICD-10-CM | POA: Diagnosis not present

## 2022-11-08 DIAGNOSIS — F418 Other specified anxiety disorders: Secondary | ICD-10-CM | POA: Diagnosis not present

## 2022-11-08 DIAGNOSIS — K589 Irritable bowel syndrome without diarrhea: Secondary | ICD-10-CM | POA: Diagnosis not present

## 2022-11-08 DIAGNOSIS — Z9049 Acquired absence of other specified parts of digestive tract: Secondary | ICD-10-CM | POA: Diagnosis not present

## 2022-11-08 DIAGNOSIS — Z90722 Acquired absence of ovaries, bilateral: Secondary | ICD-10-CM | POA: Diagnosis not present

## 2022-11-08 DIAGNOSIS — E7800 Pure hypercholesterolemia, unspecified: Secondary | ICD-10-CM | POA: Diagnosis not present

## 2022-11-08 DIAGNOSIS — M8580 Other specified disorders of bone density and structure, unspecified site: Secondary | ICD-10-CM | POA: Diagnosis not present

## 2022-11-08 DIAGNOSIS — Z87442 Personal history of urinary calculi: Secondary | ICD-10-CM | POA: Diagnosis not present

## 2022-11-08 DIAGNOSIS — M1990 Unspecified osteoarthritis, unspecified site: Secondary | ICD-10-CM | POA: Diagnosis not present

## 2022-11-08 DIAGNOSIS — S72011D Unspecified intracapsular fracture of right femur, subsequent encounter for closed fracture with routine healing: Secondary | ICD-10-CM | POA: Diagnosis not present

## 2022-11-08 DIAGNOSIS — I341 Nonrheumatic mitral (valve) prolapse: Secondary | ICD-10-CM | POA: Diagnosis not present

## 2022-11-08 DIAGNOSIS — Z90710 Acquired absence of both cervix and uterus: Secondary | ICD-10-CM | POA: Diagnosis not present

## 2022-11-08 DIAGNOSIS — M069 Rheumatoid arthritis, unspecified: Secondary | ICD-10-CM | POA: Diagnosis not present

## 2022-11-08 DIAGNOSIS — Z8619 Personal history of other infectious and parasitic diseases: Secondary | ICD-10-CM | POA: Diagnosis not present

## 2022-11-08 DIAGNOSIS — Z923 Personal history of irradiation: Secondary | ICD-10-CM | POA: Diagnosis not present

## 2022-11-08 NOTE — Patient Outreach (Signed)
  Care Coordination TOC Note Transition Care Management Follow-up Telephone Call Date of discharge and from where: [facility] 11/04/22 How have you been since you were released from the hospital? "I am having some pain and swelling.  The medication helps control the pain". Any questions or concerns? No  Items Reviewed: Did the pt receive and understand the discharge instructions provided? Yes  Medications obtained and verified? Yes  Other? No  Any new allergies since your discharge? No  Dietary orders reviewed? No Do you have support at home? Yes   Home Care and Equipment/Supplies: Were home health services ordered? yes If so, what is the name of the agency? [facility]  Has the agency set up a time to come to the patient's home? yes Were any new equipment or medical supplies ordered?  No What is the name of the medical supply agency? N/a Were you able to get the supplies/equipment? no Do you have any questions related to the use of the equipment or supplies? No  Functional Questionnaire: (I = Independent and D = Dependent) ADLs: I  Bathing/Dressing- I  Meal Prep- I  Eating- I  Maintaining continence- I  Transferring/Ambulation- I  Managing Meds- I  Follow up appointments reviewed:  PCP Hospital f/u appt confirmed? No   Specialist Hospital f/u appt confirmed? Yes  Scheduled to see Dr. Harlow Mares on 11/22/62 @ 10:00. Are transportation arrangements needed? No  If their condition worsens, is the pt aware to call PCP or go to the Emergency Dept.? Yes Was the patient provided with contact information for the PCP's office or ED? Yes Was to pt encouraged to call back with questions or concerns? Yes  SDOH assessments and interventions completed:   Yes SDOH Interventions Today    Flowsheet Row Most Recent Value  SDOH Interventions   Food Insecurity Interventions Intervention Not Indicated  Housing Interventions Intervention Not Indicated       Care Coordination Interventions:  No  Care Coordination interventions needed at this time.   Encounter Outcome:  Pt. Visit Completed

## 2022-11-09 ENCOUNTER — Inpatient Hospital Stay: Payer: Medicare Other | Admitting: Oncology

## 2022-11-09 ENCOUNTER — Inpatient Hospital Stay: Payer: Medicare Other

## 2022-11-10 DIAGNOSIS — F418 Other specified anxiety disorders: Secondary | ICD-10-CM | POA: Diagnosis not present

## 2022-11-10 DIAGNOSIS — I341 Nonrheumatic mitral (valve) prolapse: Secondary | ICD-10-CM | POA: Diagnosis not present

## 2022-11-10 DIAGNOSIS — S72011D Unspecified intracapsular fracture of right femur, subsequent encounter for closed fracture with routine healing: Secondary | ICD-10-CM | POA: Diagnosis not present

## 2022-11-10 DIAGNOSIS — Z90710 Acquired absence of both cervix and uterus: Secondary | ICD-10-CM | POA: Diagnosis not present

## 2022-11-10 DIAGNOSIS — Z923 Personal history of irradiation: Secondary | ICD-10-CM | POA: Diagnosis not present

## 2022-11-10 DIAGNOSIS — C569 Malignant neoplasm of unspecified ovary: Secondary | ICD-10-CM | POA: Diagnosis not present

## 2022-11-10 DIAGNOSIS — Z7982 Long term (current) use of aspirin: Secondary | ICD-10-CM | POA: Diagnosis not present

## 2022-11-10 DIAGNOSIS — Z87442 Personal history of urinary calculi: Secondary | ICD-10-CM | POA: Diagnosis not present

## 2022-11-10 DIAGNOSIS — M069 Rheumatoid arthritis, unspecified: Secondary | ICD-10-CM | POA: Diagnosis not present

## 2022-11-10 DIAGNOSIS — M8580 Other specified disorders of bone density and structure, unspecified site: Secondary | ICD-10-CM | POA: Diagnosis not present

## 2022-11-10 DIAGNOSIS — Z90722 Acquired absence of ovaries, bilateral: Secondary | ICD-10-CM | POA: Diagnosis not present

## 2022-11-10 DIAGNOSIS — K589 Irritable bowel syndrome without diarrhea: Secondary | ICD-10-CM | POA: Diagnosis not present

## 2022-11-10 DIAGNOSIS — M1990 Unspecified osteoarthritis, unspecified site: Secondary | ICD-10-CM | POA: Diagnosis not present

## 2022-11-10 DIAGNOSIS — Z8619 Personal history of other infectious and parasitic diseases: Secondary | ICD-10-CM | POA: Diagnosis not present

## 2022-11-10 DIAGNOSIS — E7800 Pure hypercholesterolemia, unspecified: Secondary | ICD-10-CM | POA: Diagnosis not present

## 2022-11-10 DIAGNOSIS — Z9049 Acquired absence of other specified parts of digestive tract: Secondary | ICD-10-CM | POA: Diagnosis not present

## 2022-11-13 ENCOUNTER — Other Ambulatory Visit: Payer: Self-pay | Admitting: Oncology

## 2022-11-16 ENCOUNTER — Inpatient Hospital Stay: Payer: Medicare Other

## 2022-11-16 ENCOUNTER — Ambulatory Visit (INDEPENDENT_AMBULATORY_CARE_PROVIDER_SITE_OTHER): Payer: Medicare Other

## 2022-11-16 ENCOUNTER — Encounter: Payer: Self-pay | Admitting: Nurse Practitioner

## 2022-11-16 ENCOUNTER — Inpatient Hospital Stay: Payer: Medicare Other | Attending: Oncology

## 2022-11-16 ENCOUNTER — Encounter: Payer: Self-pay | Admitting: Oncology

## 2022-11-16 ENCOUNTER — Other Ambulatory Visit: Payer: Self-pay

## 2022-11-16 ENCOUNTER — Other Ambulatory Visit (HOSPITAL_BASED_OUTPATIENT_CLINIC_OR_DEPARTMENT_OTHER): Payer: Self-pay

## 2022-11-16 ENCOUNTER — Inpatient Hospital Stay: Payer: Medicare Other | Admitting: Nurse Practitioner

## 2022-11-16 VITALS — BP 117/77 | HR 88 | Temp 98.1°F | Resp 18 | Ht 64.0 in | Wt 163.0 lb

## 2022-11-16 VITALS — BP 125/80 | HR 80

## 2022-11-16 DIAGNOSIS — G620 Drug-induced polyneuropathy: Secondary | ICD-10-CM | POA: Insufficient documentation

## 2022-11-16 DIAGNOSIS — Z5111 Encounter for antineoplastic chemotherapy: Secondary | ICD-10-CM | POA: Insufficient documentation

## 2022-11-16 DIAGNOSIS — D701 Agranulocytosis secondary to cancer chemotherapy: Secondary | ICD-10-CM | POA: Insufficient documentation

## 2022-11-16 DIAGNOSIS — C569 Malignant neoplasm of unspecified ovary: Secondary | ICD-10-CM | POA: Insufficient documentation

## 2022-11-16 DIAGNOSIS — I82411 Acute embolism and thrombosis of right femoral vein: Secondary | ICD-10-CM

## 2022-11-16 DIAGNOSIS — C786 Secondary malignant neoplasm of retroperitoneum and peritoneum: Secondary | ICD-10-CM | POA: Diagnosis not present

## 2022-11-16 DIAGNOSIS — Z95828 Presence of other vascular implants and grafts: Secondary | ICD-10-CM | POA: Insufficient documentation

## 2022-11-16 LAB — CBC WITH DIFFERENTIAL (CANCER CENTER ONLY)
Abs Immature Granulocytes: 0.03 10*3/uL (ref 0.00–0.07)
Basophils Absolute: 0 10*3/uL (ref 0.0–0.1)
Basophils Relative: 1 %
Eosinophils Absolute: 0.1 10*3/uL (ref 0.0–0.5)
Eosinophils Relative: 2 %
HCT: 36.1 % (ref 36.0–46.0)
Hemoglobin: 11.6 g/dL — ABNORMAL LOW (ref 12.0–15.0)
Immature Granulocytes: 1 %
Lymphocytes Relative: 14 %
Lymphs Abs: 0.9 10*3/uL (ref 0.7–4.0)
MCH: 32.8 pg (ref 26.0–34.0)
MCHC: 32.1 g/dL (ref 30.0–36.0)
MCV: 102 fL — ABNORMAL HIGH (ref 80.0–100.0)
Monocytes Absolute: 0.5 10*3/uL (ref 0.1–1.0)
Monocytes Relative: 8 %
Neutro Abs: 4.6 10*3/uL (ref 1.7–7.7)
Neutrophils Relative %: 74 %
Platelet Count: 348 10*3/uL (ref 150–400)
RBC: 3.54 MIL/uL — ABNORMAL LOW (ref 3.87–5.11)
RDW: 15.1 % (ref 11.5–15.5)
WBC Count: 6.1 10*3/uL (ref 4.0–10.5)
nRBC: 0 % (ref 0.0–0.2)

## 2022-11-16 LAB — CMP (CANCER CENTER ONLY)
ALT: 37 U/L (ref 0–44)
AST: 23 U/L (ref 15–41)
Albumin: 3.9 g/dL (ref 3.5–5.0)
Alkaline Phosphatase: 68 U/L (ref 38–126)
Anion gap: 10 (ref 5–15)
BUN: 20 mg/dL (ref 8–23)
CO2: 27 mmol/L (ref 22–32)
Calcium: 9.4 mg/dL (ref 8.9–10.3)
Chloride: 104 mmol/L (ref 98–111)
Creatinine: 0.61 mg/dL (ref 0.44–1.00)
GFR, Estimated: >60 mL/min (ref >60)
Glucose, Bld: 136 mg/dL — ABNORMAL HIGH (ref 70–99)
Potassium: 3.9 mmol/L (ref 3.5–5.1)
Sodium: 141 mmol/L (ref 135–145)
Total Bilirubin: 0.4 mg/dL (ref 0.3–1.2)
Total Protein: 6.8 g/dL (ref 6.5–8.1)

## 2022-11-16 MED ORDER — SODIUM CHLORIDE 0.9 % IV SOLN: Freq: Once | INTRAVENOUS | Status: AC

## 2022-11-16 MED ORDER — SODIUM CHLORIDE 0.9% FLUSH
10.0000 mL | INTRAVENOUS | Status: DC | PRN
  Administered 2022-11-16: 10 mL

## 2022-11-16 MED ORDER — FAMOTIDINE IN NACL 20-0.9 MG/50ML-% IV SOLN
20.0000 mg | Freq: Once | INTRAVENOUS | Status: AC
  Administered 2022-11-16: 20 mg via INTRAVENOUS
  Filled 2022-11-16: qty 50

## 2022-11-16 MED ORDER — HEPARIN SOD (PORK) LOCK FLUSH 100 UNIT/ML IV SOLN
500.0000 [IU] | Freq: Once | INTRAVENOUS | Status: AC | PRN
  Administered 2022-11-16: 500 [IU]

## 2022-11-16 MED ORDER — HEPARIN SOD (PORK) LOCK FLUSH 100 UNIT/ML IV SOLN: 500.0000 [IU] | Freq: Once | INTRAVENOUS | Status: DC | PRN

## 2022-11-16 MED ORDER — DIPHENHYDRAMINE HCL 50 MG/ML IJ SOLN
25.0000 mg | Freq: Once | INTRAMUSCULAR | Status: AC
  Administered 2022-11-16: 25 mg via INTRAVENOUS
  Filled 2022-11-16: qty 1

## 2022-11-16 MED ORDER — SODIUM CHLORIDE 0.9 % IV SOLN
10.0000 mg | Freq: Once | INTRAVENOUS | Status: AC
  Administered 2022-11-16: 10 mg via INTRAVENOUS
  Filled 2022-11-16: qty 10

## 2022-11-16 MED ORDER — SODIUM CHLORIDE 0.9% FLUSH: 10.0000 mL | INTRAVENOUS | Status: DC | PRN

## 2022-11-16 MED ORDER — PALONOSETRON HCL INJECTION 0.25 MG/5ML
0.2500 mg | Freq: Once | INTRAVENOUS | Status: AC
  Administered 2022-11-16: 0.25 mg via INTRAVENOUS
  Filled 2022-11-16: qty 5

## 2022-11-16 MED ORDER — APIXABAN (ELIQUIS) VTE STARTER PACK (10MG AND 5MG)
ORAL_TABLET | ORAL | 0 refills | Status: DC
  Filled 2022-11-16: qty 74, 30d supply, fill #0

## 2022-11-16 MED ORDER — SODIUM CHLORIDE 0.9 % IV SOLN
161.2000 mg | Freq: Once | INTRAVENOUS | Status: AC
  Administered 2022-11-16: 150 mg via INTRAVENOUS
  Filled 2022-11-16: qty 15

## 2022-11-16 NOTE — Progress Notes (Signed)
Patient seen by Lisa Thomas NP today  Vitals are within treatment parameters.  Labs reviewed by Lisa Thomas NP and are within treatment parameters.  Per physician team, patient is ready for treatment and there are NO modifications to the treatment plan.     

## 2022-11-16 NOTE — Patient Instructions (Signed)
[facility]   Discharge Instructions: Thank you for choosing [facility] to provide your oncology and hematology care.   If you have a lab appointment with the [facility], please go directly to the [facility] and check in at the registration area.   Wear comfortable clothing and clothing appropriate for easy access to any Portacath or PICC line.   We strive to give you quality time with your provider. You may need to reschedule your appointment if you arrive late (15 or more minutes).  Arriving late affects you and other patients whose appointments are after yours.  Also, if you miss three or more appointments without notifying the office, you may be dismissed from the clinic at the provider's discretion.      For prescription refill requests, have your pharmacy contact our office and allow 72 hours for refills to be completed.    Today you received the following chemotherapy and/or immunotherapy agents Carboplatin.      To help prevent nausea and vomiting after your treatment, we encourage you to take your nausea medication as directed.  BELOW ARE SYMPTOMS THAT SHOULD BE REPORTED IMMEDIATELY: *FEVER GREATER THAN 100.4 F (38 C) OR HIGHER *CHILLS OR SWEATING *NAUSEA AND VOMITING THAT IS NOT CONTROLLED WITH YOUR NAUSEA MEDICATION *UNUSUAL SHORTNESS OF BREATH *UNUSUAL BRUISING OR BLEEDING *URINARY PROBLEMS (pain or burning when urinating, or frequent urination) *BOWEL PROBLEMS (unusual diarrhea, constipation, pain near the anus) TENDERNESS IN MOUTH AND THROAT WITH OR WITHOUT PRESENCE OF ULCERS (sore throat, sores in mouth, or a toothache) UNUSUAL RASH, SWELLING OR PAIN  UNUSUAL VAGINAL DISCHARGE OR ITCHING   Items with * indicate a potential emergency and should be followed up as soon as possible or go to the Emergency Department if any problems should occur.  Please show the CHEMOTHERAPY ALERT CARD or IMMUNOTHERAPY ALERT CARD at  check-in to the Emergency Department and triage nurse.  Should you have questions after your visit or need to cancel or reschedule your appointment, please contact [facility]  Dept: [phone]  and follow the prompts.  Office hours are 8:00 a.m. to 4:30 p.m. Monday - Friday. Please note that voicemails left after 4:00 p.m. may not be returned until the following business day.  We are closed weekends and major holidays. You have access to a nurse at all times for urgent questions. Please call the main number to the clinic Dept: [phone] and follow the prompts.   For any non-urgent questions, you may also contact your provider using MyChart. We now offer e-Visits for anyone 77 and older to request care online for non-urgent symptoms. For details visit [email or web address].   Also download the MyChart app! Go to the app store, search "MyChart", open the app, select [facility], and log in with your MyChart username and password.  Carboplatin Injection What is this medication? CARBOPLATIN (KAR boe pla tin) treats some types of cancer. It works by slowing down the growth of cancer cells. This medicine may be used for other purposes; ask your health care provider or pharmacist if you have questions. COMMON BRAND NAME(S): Paraplatin What should I tell my care team before I take this medication? They need to know if you have any of these conditions: Blood disorders Hearing problems Kidney disease Recent or ongoing radiation therapy An unusual or allergic reaction to carboplatin, cisplatin, other medications, foods, dyes, or preservatives Pregnant or trying to get pregnant Breast-feeding How should I  use this medication? This medication is injected into a vein. It is given by your care team in a hospital or clinic setting. Talk to your care team about the use of this medication in children. Special care may be needed. Overdosage: If you think you have  taken too much of this medicine contact a poison control center or emergency room at once. NOTE: This medicine is only for you. Do not share this medicine with others. What if I miss a dose? Keep appointments for follow-up doses. It is important not to miss your dose. Call your care team if you are unable to keep an appointment. What may interact with this medication? Medications for seizures Some antibiotics, such as amikacin, gentamicin, neomycin, streptomycin, tobramycin Vaccines This list may not describe all possible interactions. Give your health care provider a list of all the medicines, herbs, non-prescription drugs, or dietary supplements you use. Also tell them if you smoke, drink alcohol, or use illegal drugs. Some items may interact with your medicine. What should I watch for while using this medication? Your condition will be monitored carefully while you are receiving this medication. You may need blood work while taking this medication. This medication may make you feel generally unwell. This is not uncommon, as chemotherapy can affect healthy cells as well as cancer cells. Report any side effects. Continue your course of treatment even though you feel ill unless your care team tells you to stop. In some cases, you may be given additional medications to help with side effects. Follow all directions for their use. This medication may increase your risk of getting an infection. Call your care team for advice if you get a fever, chills, sore throat, or other symptoms of a cold or flu. Do not treat yourself. Try to avoid being around people who are sick. Avoid taking medications that contain aspirin, acetaminophen, ibuprofen, naproxen, or ketoprofen unless instructed by your care team. These medications may hide a fever. Be careful brushing or flossing your teeth or using a toothpick because you may get an infection or bleed more easily. If you have any dental work done, tell your dentist  you are receiving this medication. Talk to your care team if you wish to become pregnant or think you might be pregnant. This medication can cause serious birth defects. Talk to your care team about effective forms of contraception. Do not breast-feed while taking this medication. What side effects may I notice from receiving this medication? Side effects that you should report to your care team as soon as possible: Allergic reactions--skin rash, itching, hives, swelling of the face, lips, tongue, or throat Infection--fever, chills, cough, sore throat, wounds that don't heal, pain or trouble when passing urine, general feeling of discomfort or being unwell Low red blood cell level--unusual weakness or fatigue, dizziness, headache, trouble breathing Pain, tingling, or numbness in the hands or feet, muscle weakness, change in vision, confusion or trouble speaking, loss of balance or coordination, trouble walking, seizures Unusual bruising or bleeding Side effects that usually do not require medical attention (report to your care team if they continue or are bothersome): Hair loss Nausea Unusual weakness or fatigue Vomiting This list may not describe all possible side effects. Call your doctor for medical advice about side effects. You may report side effects to FDA at 1-800-FDA-1088. Where should I keep my medication? This medication is given in a hospital or clinic. It will not be stored at home. NOTE: This sheet is a summary. It  may not cover all possible information. If you have questions about this medicine, talk to your doctor, pharmacist, or health care provider.  2023 Elsevier/Gold Standard (2022-01-04 00:00:00)

## 2022-11-16 NOTE — Progress Notes (Addendum)
[place] OFFICE PROGRESS NOTE   Diagnosis: Ovarian cancer  INTERVAL HISTORY:   Ms. Bleecker returns for follow-up.  She completed another cycle of carboplatin 10/26/2022.  She was scheduled to begin another cycle last week.  She was hospitalized 11/02/2022 through 11/04/2022 following a fall.  She was found to have a fracture of the right hip status post hemiarthroplasty 11/03/2022.  She reports tolerating the chemotherapy well.  No nausea or vomiting.  No mouth sores.  No diarrhea or constipation.  No signs of allergic reaction.  She mainly has pain when moving from a sitting to standing position.  She is working with physical therapy.  She notes swelling throughout the right leg.  Objective:  Vital signs in last 24 hours:  Blood pressure 117/77, pulse 88, temperature 98.1 F (36.7 C), temperature source Oral, resp. rate 18, height 5' 4"$  (1.626 m), weight 163 lb (73.9 kg), SpO2 100 %.    HEENT: No thrush or ulcers. Resp: Lungs clear bilaterally. Cardio: Regular rate and rhythm. GI: Abdomen is soft.  Mild tenderness right lower abdomen.  No hepatosplenomegaly.  No mass. Vascular: Edema throughout the right leg.  Trace edema left lower leg. Skin: Right hip/leg surgical incision intact with staples, mild surrounding erythema. Port-A-Cath without erythema.  Lab Results:  Lab Results  Component Value Date   WBC 6.1 11/16/2022   HGB 11.6 (L) 11/16/2022   HCT 36.1 11/16/2022   MCV 102.0 (H) 11/16/2022   PLT 348 11/16/2022   NEUTROABS 4.6 11/16/2022    Imaging:  No results found.  Medications: I have reviewed the patient's current medications.  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 A 5 mm nodules remain on the right diaphragm. - TumorNext paired germline/tumor analyses: No somatic variants  detected, germline CHEK2      VUS      Cycle 1 of adjuvant Taxol/carboplatin chemotherapy initiated on 09/19/2012.   The CA 125 normalized.   She completed day 15 of cycle 6 on 02/06/2013.   Restaging CT evaluation 03/29/2013 showed no evidence of metastatic disease in the chest. There was marked improvement in appearance/resolution of previous described peritoneal/omental disease. There was no convincing evidence of residual disease. There was minimal increased density in the region of the omentum favored to be treatment-related. There was no pelvic adenopathy. CA125 3.2 on 02/19/2014. 08/18/2016 CA-125 8 CT abdomen/pelvis 08/25/2016-solitary new enlarged right external iliac lymph node measuring 2.2 cm. Biopsy 09/07/2016-adenocarcinoma consistent with high-grade serous carcinoma. PET scan 09/22/2016-malignant range FDG uptake associated with the enlarged right external iliac lymph node compatible with metastatic adenopathy.  No additional sites of metastatic disease identified. Radiation 10/25/2016-12/01/2016 PET scan 03/28/2017-previously enlarged hypermetabolic right external iliac node-normal in size with resolution of hypermetabolic activity, no active malignancy identified PET scan 05/15/2018-no evidence of recurrent or metastatic disease, no hypermetabolic lymph nodes CT abdomen/pelvis 09/19/2019-mild increase in the size of several left upper quadrant peritoneal nodules measuring up to 11 mm.  No other sites of metastatic disease identified.  No ascites. CT abdomen/pelvis 03/14/2020-slight enlargement of several left upper quadrant peritoneal nodules, no ascites, no other evidence of disease progression CT abdomen/pelvis 09/12/2020 peritoneal nodularity/omental caking predominantly in the left upper/mid abdomen, mildly progressive.  Largest implant in the left upper abdomen adjacent to the stomach now measures 17 mm, previously 13 mm.  Overall volume of peritoneal disease has mildly  progressed. CT abdomen/pelvis 01/13/2021- 4  mm right ureteral calculus with moderate right hydronephrosis and upper right hydroureter, progressive omental nodularity, stable calcified and partially calcified right pelvic nodules CT abdomen/pelvis 05/13/2021-enlargement of left upper quadrant omental nodules and a peritoneal nodule at the right iliac fossa, no ascites CT abdomen/pelvis 08/14/2021-mild increase in size of omental nodules, no ascites, no new site of metastatic disease Taxol/carboplatin 09/09/2021, 09/15/2021, 09/22/2021 Taxol/carboplatin 10/07/2021, 10/13/2021, 10/20/2021 Taxol/Carboplatin 11/03/2021, 11/10/2021, 11/17/2021 CT 11/23/2021-decrease in peritoneal metastases, no new or progressive disease, airspace opacity at both lung bases Taxol/carboplatin 12/01/2021, 12/08/2021, 12/15/2021 Taxol/Carboplatin every 2 weeks beginning 12/29/2021 Carboplatin alone 01/26/2022, Taxol held due to neuropathy Carboplatin alone 02/09/2022, Taxol held due to neuropathy Carboplatin alone 02/23/2022, Taxol held due to neuropathy Carboplatin alone 03/09/2022, Taxol held due to neuropathy CTs 03/25/2022-no significant change in omental, mesenteric disease.  No new or clearly progressive findings.  Previously noted patchy areas of consolidative opacity at the lung bases have resolved. Treatment break beginning 03/26/2022 CT/pelvis 08/10/2022-increased size of peritoneal nodules, increased small volume ascites, mild right lower lobe pleural nodularity Cycle 1 salvage carboplatin 09/14/2022 (weekly x 3 followed by 1 week break) Cycle 2 salvage carboplatin 10/12/2022 (weekly x 3 followed by 1 week break) Cycle 3 salvage carboplatin 11/16/2022 (weekly x 3 followed by 1 week break)   2. Low abdomen/suprapubic pain prior to the exploratory laparotomy-likely secondary to omental/pelvic tumor; persistent mild pain in the lower abdomen   3. Chronic neck and back pain.   4. Anxiety -persistent despite Lexapro and Xanax. She has been  evaluated by psychiatry. Currently on Lexapro. 5. Status post Port-A-Cath placement 09/22/2012. The Port-A-Cath was removed on 04/03/2013.   6. Neutropenia secondary to chemotherapy- day 15 cycle 1 and cycle 3. Taxol/carboplatin not given secondary to neutropenia.    7. Herpes zoster involving a right thoracic dermatome July 2015. She completed a course of Valtrex. 8. Nodular bony prominence at the left pelvis on rectal exam 06/05/2015-likely a benign finding 9.  Right ureter stone/hydroureteronephrosis on CT 01/13/2021-referred to urology; status post right ureteroscopy with stone extraction and ureteral stent placement.  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  Right hip fracture following a fall 138 2024-status post 12.  Acute localized thrombus right proximal femoral vein 11/16/2022 Eliquis initiated  Disposition: Ms. Chhay appears stable.  She has completed 2 cycles of carboplatin, weekly x 3 followed by 1 week break.  She was scheduled to begin cycle 3 last week.  This was held due to fracture of the right hip following a fall, status post surgery on 11/03/2022.  The surgical incision appears adequately healed to proceed with cycle 3 carboplatin today as scheduled.  Restaging CTs prior to cycle 4.  She has follow-up with orthopedics next week.  CBC and chemistry panel reviewed.  Labs adequate to proceed as above.  Referring for venous Doppler of the right leg due to significant edema.  We will see her in follow-up in approximately 4 weeks, restaging CTs a few days prior.    Ned Card ANP/GNP-BC   11/16/2022  8:26 AM  Addendum 1:27 PM-Doppler showed acute localized thrombus right proximal femoral vein.  Eliquis starter pack prescribed.  Risk/benefits of anticoagulation reviewed.  She agrees to begin Eliquis.  She will discontinue aspirin.

## 2022-11-16 NOTE — Addendum Note (Signed)
Addended by: Owens Shark on: 11/16/2022 01:31 PM   Modules accepted: Orders

## 2022-11-17 DIAGNOSIS — Z87442 Personal history of urinary calculi: Secondary | ICD-10-CM | POA: Diagnosis not present

## 2022-11-17 DIAGNOSIS — Z8619 Personal history of other infectious and parasitic diseases: Secondary | ICD-10-CM | POA: Diagnosis not present

## 2022-11-17 DIAGNOSIS — Z9049 Acquired absence of other specified parts of digestive tract: Secondary | ICD-10-CM | POA: Diagnosis not present

## 2022-11-17 DIAGNOSIS — M069 Rheumatoid arthritis, unspecified: Secondary | ICD-10-CM | POA: Diagnosis not present

## 2022-11-17 DIAGNOSIS — C569 Malignant neoplasm of unspecified ovary: Secondary | ICD-10-CM | POA: Diagnosis not present

## 2022-11-17 DIAGNOSIS — Z7982 Long term (current) use of aspirin: Secondary | ICD-10-CM | POA: Diagnosis not present

## 2022-11-17 DIAGNOSIS — I341 Nonrheumatic mitral (valve) prolapse: Secondary | ICD-10-CM | POA: Diagnosis not present

## 2022-11-17 DIAGNOSIS — Z90722 Acquired absence of ovaries, bilateral: Secondary | ICD-10-CM | POA: Diagnosis not present

## 2022-11-17 DIAGNOSIS — S72011D Unspecified intracapsular fracture of right femur, subsequent encounter for closed fracture with routine healing: Secondary | ICD-10-CM | POA: Diagnosis not present

## 2022-11-17 DIAGNOSIS — E7800 Pure hypercholesterolemia, unspecified: Secondary | ICD-10-CM | POA: Diagnosis not present

## 2022-11-17 DIAGNOSIS — Z923 Personal history of irradiation: Secondary | ICD-10-CM | POA: Diagnosis not present

## 2022-11-17 DIAGNOSIS — M1990 Unspecified osteoarthritis, unspecified site: Secondary | ICD-10-CM | POA: Diagnosis not present

## 2022-11-17 DIAGNOSIS — M8580 Other specified disorders of bone density and structure, unspecified site: Secondary | ICD-10-CM | POA: Diagnosis not present

## 2022-11-17 DIAGNOSIS — F418 Other specified anxiety disorders: Secondary | ICD-10-CM | POA: Diagnosis not present

## 2022-11-17 DIAGNOSIS — K589 Irritable bowel syndrome without diarrhea: Secondary | ICD-10-CM | POA: Diagnosis not present

## 2022-11-17 DIAGNOSIS — Z90710 Acquired absence of both cervix and uterus: Secondary | ICD-10-CM | POA: Diagnosis not present

## 2022-11-17 LAB — CA 125: Cancer Antigen (CA) 125: 50.2 U/mL — ABNORMAL HIGH (ref 0.0–38.1)

## 2022-11-18 ENCOUNTER — Other Ambulatory Visit: Payer: Self-pay | Admitting: *Deleted

## 2022-11-18 DIAGNOSIS — Z9049 Acquired absence of other specified parts of digestive tract: Secondary | ICD-10-CM | POA: Diagnosis not present

## 2022-11-18 DIAGNOSIS — E7800 Pure hypercholesterolemia, unspecified: Secondary | ICD-10-CM | POA: Diagnosis not present

## 2022-11-18 DIAGNOSIS — Z7982 Long term (current) use of aspirin: Secondary | ICD-10-CM | POA: Diagnosis not present

## 2022-11-18 DIAGNOSIS — M1990 Unspecified osteoarthritis, unspecified site: Secondary | ICD-10-CM | POA: Diagnosis not present

## 2022-11-18 DIAGNOSIS — Z90722 Acquired absence of ovaries, bilateral: Secondary | ICD-10-CM | POA: Diagnosis not present

## 2022-11-18 DIAGNOSIS — Z90710 Acquired absence of both cervix and uterus: Secondary | ICD-10-CM | POA: Diagnosis not present

## 2022-11-18 DIAGNOSIS — F418 Other specified anxiety disorders: Secondary | ICD-10-CM | POA: Diagnosis not present

## 2022-11-18 DIAGNOSIS — Z923 Personal history of irradiation: Secondary | ICD-10-CM | POA: Diagnosis not present

## 2022-11-18 DIAGNOSIS — Z87442 Personal history of urinary calculi: Secondary | ICD-10-CM | POA: Diagnosis not present

## 2022-11-18 DIAGNOSIS — M8580 Other specified disorders of bone density and structure, unspecified site: Secondary | ICD-10-CM | POA: Diagnosis not present

## 2022-11-18 DIAGNOSIS — C569 Malignant neoplasm of unspecified ovary: Secondary | ICD-10-CM | POA: Diagnosis not present

## 2022-11-18 DIAGNOSIS — I341 Nonrheumatic mitral (valve) prolapse: Secondary | ICD-10-CM | POA: Diagnosis not present

## 2022-11-18 DIAGNOSIS — M069 Rheumatoid arthritis, unspecified: Secondary | ICD-10-CM | POA: Diagnosis not present

## 2022-11-18 DIAGNOSIS — K589 Irritable bowel syndrome without diarrhea: Secondary | ICD-10-CM | POA: Diagnosis not present

## 2022-11-18 DIAGNOSIS — S72011D Unspecified intracapsular fracture of right femur, subsequent encounter for closed fracture with routine healing: Secondary | ICD-10-CM | POA: Diagnosis not present

## 2022-11-18 DIAGNOSIS — Z8619 Personal history of other infectious and parasitic diseases: Secondary | ICD-10-CM | POA: Diagnosis not present

## 2022-11-22 DIAGNOSIS — M25551 Pain in right hip: Secondary | ICD-10-CM | POA: Diagnosis not present

## 2022-11-22 DIAGNOSIS — I82409 Acute embolism and thrombosis of unspecified deep veins of unspecified lower extremity: Secondary | ICD-10-CM | POA: Diagnosis not present

## 2022-11-23 ENCOUNTER — Inpatient Hospital Stay: Payer: Medicare Other

## 2022-11-23 VITALS — BP 123/73 | HR 73 | Temp 97.8°F | Resp 18 | Ht 64.0 in | Wt 160.4 lb

## 2022-11-23 DIAGNOSIS — C569 Malignant neoplasm of unspecified ovary: Secondary | ICD-10-CM

## 2022-11-23 DIAGNOSIS — C786 Secondary malignant neoplasm of retroperitoneum and peritoneum: Secondary | ICD-10-CM | POA: Diagnosis not present

## 2022-11-23 DIAGNOSIS — Z5111 Encounter for antineoplastic chemotherapy: Secondary | ICD-10-CM | POA: Diagnosis not present

## 2022-11-23 DIAGNOSIS — G620 Drug-induced polyneuropathy: Secondary | ICD-10-CM | POA: Diagnosis not present

## 2022-11-23 DIAGNOSIS — D701 Agranulocytosis secondary to cancer chemotherapy: Secondary | ICD-10-CM | POA: Diagnosis not present

## 2022-11-23 LAB — CMP (CANCER CENTER ONLY)
ALT: 23 U/L (ref 0–44)
AST: 19 U/L (ref 15–41)
Albumin: 4.1 g/dL (ref 3.5–5.0)
Alkaline Phosphatase: 68 U/L (ref 38–126)
Anion gap: 8 (ref 5–15)
BUN: 21 mg/dL (ref 8–23)
CO2: 28 mmol/L (ref 22–32)
Calcium: 9.5 mg/dL (ref 8.9–10.3)
Chloride: 104 mmol/L (ref 98–111)
Creatinine: 0.59 mg/dL (ref 0.44–1.00)
GFR, Estimated: >60 mL/min (ref >60)
Glucose, Bld: 89 mg/dL (ref 70–99)
Potassium: 3.8 mmol/L (ref 3.5–5.1)
Sodium: 140 mmol/L (ref 135–145)
Total Bilirubin: 0.4 mg/dL (ref 0.3–1.2)
Total Protein: 6.5 g/dL (ref 6.5–8.1)

## 2022-11-23 LAB — CBC WITH DIFFERENTIAL (CANCER CENTER ONLY)
Abs Immature Granulocytes: 0.02 10*3/uL (ref 0.00–0.07)
Basophils Absolute: 0 10*3/uL (ref 0.0–0.1)
Basophils Relative: 1 %
Eosinophils Absolute: 0.1 10*3/uL (ref 0.0–0.5)
Eosinophils Relative: 2 %
HCT: 36.4 % (ref 36.0–46.0)
Hemoglobin: 11.8 g/dL — ABNORMAL LOW (ref 12.0–15.0)
Immature Granulocytes: 0 %
Lymphocytes Relative: 16 %
Lymphs Abs: 0.8 10*3/uL (ref 0.7–4.0)
MCH: 33 pg (ref 26.0–34.0)
MCHC: 32.4 g/dL (ref 30.0–36.0)
MCV: 101.7 fL — ABNORMAL HIGH (ref 80.0–100.0)
Monocytes Absolute: 0.6 10*3/uL (ref 0.1–1.0)
Monocytes Relative: 11 %
Neutro Abs: 3.6 10*3/uL (ref 1.7–7.7)
Neutrophils Relative %: 70 %
Platelet Count: 359 10*3/uL (ref 150–400)
RBC: 3.58 MIL/uL — ABNORMAL LOW (ref 3.87–5.11)
RDW: 15.1 % (ref 11.5–15.5)
WBC Count: 5.1 10*3/uL (ref 4.0–10.5)
nRBC: 0 % (ref 0.0–0.2)

## 2022-11-23 MED ORDER — SODIUM CHLORIDE 0.9 % IV SOLN
10.0000 mg | Freq: Once | INTRAVENOUS | Status: AC
  Administered 2022-11-23: 10 mg via INTRAVENOUS
  Filled 2022-11-23: qty 1

## 2022-11-23 MED ORDER — FAMOTIDINE IN NACL 20-0.9 MG/50ML-% IV SOLN
20.0000 mg | Freq: Once | INTRAVENOUS | Status: AC
  Administered 2022-11-23: 20 mg via INTRAVENOUS
  Filled 2022-11-23: qty 50

## 2022-11-23 MED ORDER — HEPARIN SOD (PORK) LOCK FLUSH 100 UNIT/ML IV SOLN
500.0000 [IU] | Freq: Once | INTRAVENOUS | Status: AC | PRN
  Administered 2022-11-23: 500 [IU]

## 2022-11-23 MED ORDER — SODIUM CHLORIDE 0.9 % IV SOLN: Freq: Once | INTRAVENOUS | Status: AC

## 2022-11-23 MED ORDER — DIPHENHYDRAMINE HCL 50 MG/ML IJ SOLN
25.0000 mg | Freq: Once | INTRAMUSCULAR | Status: AC
  Administered 2022-11-23: 25 mg via INTRAVENOUS
  Filled 2022-11-23: qty 1

## 2022-11-23 MED ORDER — PALONOSETRON HCL INJECTION 0.25 MG/5ML
0.2500 mg | Freq: Once | INTRAVENOUS | Status: AC
  Administered 2022-11-23: 0.25 mg via INTRAVENOUS
  Filled 2022-11-23: qty 5

## 2022-11-23 MED ORDER — SODIUM CHLORIDE 0.9% FLUSH
10.0000 mL | INTRAVENOUS | Status: DC | PRN
  Administered 2022-11-23: 10 mL

## 2022-11-23 MED ORDER — SODIUM CHLORIDE 0.9 % IV SOLN
159.4000 mg | Freq: Once | INTRAVENOUS | Status: AC
  Administered 2022-11-23: 150 mg via INTRAVENOUS
  Filled 2022-11-23: qty 15

## 2022-11-23 NOTE — Patient Instructions (Signed)
[facility]   Discharge Instructions: Thank you for choosing [facility] to provide your oncology and hematology care.   If you have a lab appointment with the [facility], please go directly to the [facility] and check in at the registration area.   Wear comfortable clothing and clothing appropriate for easy access to any Portacath or PICC line.   We strive to give you quality time with your provider. You may need to reschedule your appointment if you arrive late (15 or more minutes).  Arriving late affects you and other patients whose appointments are after yours.  Also, if you miss three or more appointments without notifying the office, you may be dismissed from the clinic at the provider's discretion.      For prescription refill requests, have your pharmacy contact our office and allow 72 hours for refills to be completed.    Today you received the following chemotherapy and/or immunotherapy agents Carboplatin (PARAPLATIN).      To help prevent nausea and vomiting after your treatment, we encourage you to take your nausea medication as directed.  BELOW ARE SYMPTOMS THAT SHOULD BE REPORTED IMMEDIATELY: *FEVER GREATER THAN 100.4 F (38 C) OR HIGHER *CHILLS OR SWEATING *NAUSEA AND VOMITING THAT IS NOT CONTROLLED WITH YOUR NAUSEA MEDICATION *UNUSUAL SHORTNESS OF BREATH *UNUSUAL BRUISING OR BLEEDING *URINARY PROBLEMS (pain or burning when urinating, or frequent urination) *BOWEL PROBLEMS (unusual diarrhea, constipation, pain near the anus) TENDERNESS IN MOUTH AND THROAT WITH OR WITHOUT PRESENCE OF ULCERS (sore throat, sores in mouth, or a toothache) UNUSUAL RASH, SWELLING OR PAIN  UNUSUAL VAGINAL DISCHARGE OR ITCHING   Items with * indicate a potential emergency and should be followed up as soon as possible or go to the Emergency Department if any problems should occur.  Please show the CHEMOTHERAPY ALERT CARD or IMMUNOTHERAPY  ALERT CARD at check-in to the Emergency Department and triage nurse.  Should you have questions after your visit or need to cancel or reschedule your appointment, please contact [facility]  Dept: [phone]  and follow the prompts.  Office hours are 8:00 a.m. to 4:30 p.m. Monday - Friday. Please note that voicemails left after 4:00 p.m. may not be returned until the following business day.  We are closed weekends and major holidays. You have access to a nurse at all times for urgent questions. Please call the main number to the clinic Dept: [phone] and follow the prompts.   For any non-urgent questions, you may also contact your provider using MyChart. We now offer e-Visits for anyone 41 and older to request care online for non-urgent symptoms. For details visit [email or web address].   Also download the MyChart app! Go to the app store, search "MyChart", open the app, select [facility], and log in with your MyChart username and password.  Carboplatin Injection What is this medication? CARBOPLATIN (KAR boe pla tin) treats some types of cancer. It works by slowing down the growth of cancer cells. This medicine may be used for other purposes; ask your health care provider or pharmacist if you have questions. COMMON BRAND NAME(S): Paraplatin What should I tell my care team before I take this medication? They need to know if you have any of these conditions: Blood disorders Hearing problems Kidney disease Recent or ongoing radiation therapy An unusual or allergic reaction to carboplatin, cisplatin, other medications, foods, dyes, or preservatives Pregnant or trying to get pregnant Breast-feeding How should  I use this medication? This medication is injected into a vein. It is given by your care team in a hospital or clinic setting. Talk to your care team about the use of this medication in children. Special care may be needed. Overdosage: If you  think you have taken too much of this medicine contact a poison control center or emergency room at once. NOTE: This medicine is only for you. Do not share this medicine with others. What if I miss a dose? Keep appointments for follow-up doses. It is important not to miss your dose. Call your care team if you are unable to keep an appointment. What may interact with this medication? Medications for seizures Some antibiotics, such as amikacin, gentamicin, neomycin, streptomycin, tobramycin Vaccines This list may not describe all possible interactions. Give your health care provider a list of all the medicines, herbs, non-prescription drugs, or dietary supplements you use. Also tell them if you smoke, drink alcohol, or use illegal drugs. Some items may interact with your medicine. What should I watch for while using this medication? Your condition will be monitored carefully while you are receiving this medication. You may need blood work while taking this medication. This medication may make you feel generally unwell. This is not uncommon, as chemotherapy can affect healthy cells as well as cancer cells. Report any side effects. Continue your course of treatment even though you feel ill unless your care team tells you to stop. In some cases, you may be given additional medications to help with side effects. Follow all directions for their use. This medication may increase your risk of getting an infection. Call your care team for advice if you get a fever, chills, sore throat, or other symptoms of a cold or flu. Do not treat yourself. Try to avoid being around people who are sick. Avoid taking medications that contain aspirin, acetaminophen, ibuprofen, naproxen, or ketoprofen unless instructed by your care team. These medications may hide a fever. Be careful brushing or flossing your teeth or using a toothpick because you may get an infection or bleed more easily. If you have any dental work done, tell  your dentist you are receiving this medication. Talk to your care team if you wish to become pregnant or think you might be pregnant. This medication can cause serious birth defects. Talk to your care team about effective forms of contraception. Do not breast-feed while taking this medication. What side effects may I notice from receiving this medication? Side effects that you should report to your care team as soon as possible: Allergic reactions--skin rash, itching, hives, swelling of the face, lips, tongue, or throat Infection--fever, chills, cough, sore throat, wounds that don't heal, pain or trouble when passing urine, general feeling of discomfort or being unwell Low red blood cell level--unusual weakness or fatigue, dizziness, headache, trouble breathing Pain, tingling, or numbness in the hands or feet, muscle weakness, change in vision, confusion or trouble speaking, loss of balance or coordination, trouble walking, seizures Unusual bruising or bleeding Side effects that usually do not require medical attention (report to your care team if they continue or are bothersome): Hair loss Nausea Unusual weakness or fatigue Vomiting This list may not describe all possible side effects. Call your doctor for medical advice about side effects. You may report side effects to FDA at 1-800-FDA-1088. Where should I keep my medication? This medication is given in a hospital or clinic. It will not be stored at home. NOTE: This sheet is a summary.  It may not cover all possible information. If you have questions about this medicine, talk to your doctor, pharmacist, or health care provider.  2023 Elsevier/Gold Standard (2022-01-04 00:00:00)

## 2022-11-24 DIAGNOSIS — K589 Irritable bowel syndrome without diarrhea: Secondary | ICD-10-CM | POA: Diagnosis not present

## 2022-11-24 DIAGNOSIS — Z8619 Personal history of other infectious and parasitic diseases: Secondary | ICD-10-CM | POA: Diagnosis not present

## 2022-11-24 DIAGNOSIS — Z90722 Acquired absence of ovaries, bilateral: Secondary | ICD-10-CM | POA: Diagnosis not present

## 2022-11-24 DIAGNOSIS — I341 Nonrheumatic mitral (valve) prolapse: Secondary | ICD-10-CM | POA: Diagnosis not present

## 2022-11-24 DIAGNOSIS — M069 Rheumatoid arthritis, unspecified: Secondary | ICD-10-CM | POA: Diagnosis not present

## 2022-11-24 DIAGNOSIS — S72011D Unspecified intracapsular fracture of right femur, subsequent encounter for closed fracture with routine healing: Secondary | ICD-10-CM | POA: Diagnosis not present

## 2022-11-24 DIAGNOSIS — Z90710 Acquired absence of both cervix and uterus: Secondary | ICD-10-CM | POA: Diagnosis not present

## 2022-11-24 DIAGNOSIS — Z87442 Personal history of urinary calculi: Secondary | ICD-10-CM | POA: Diagnosis not present

## 2022-11-24 DIAGNOSIS — F418 Other specified anxiety disorders: Secondary | ICD-10-CM | POA: Diagnosis not present

## 2022-11-24 DIAGNOSIS — M8580 Other specified disorders of bone density and structure, unspecified site: Secondary | ICD-10-CM | POA: Diagnosis not present

## 2022-11-24 DIAGNOSIS — E7800 Pure hypercholesterolemia, unspecified: Secondary | ICD-10-CM | POA: Diagnosis not present

## 2022-11-24 DIAGNOSIS — M1990 Unspecified osteoarthritis, unspecified site: Secondary | ICD-10-CM | POA: Diagnosis not present

## 2022-11-24 DIAGNOSIS — C569 Malignant neoplasm of unspecified ovary: Secondary | ICD-10-CM | POA: Diagnosis not present

## 2022-11-24 DIAGNOSIS — Z9049 Acquired absence of other specified parts of digestive tract: Secondary | ICD-10-CM | POA: Diagnosis not present

## 2022-11-24 DIAGNOSIS — Z7982 Long term (current) use of aspirin: Secondary | ICD-10-CM | POA: Diagnosis not present

## 2022-11-24 DIAGNOSIS — Z923 Personal history of irradiation: Secondary | ICD-10-CM | POA: Diagnosis not present

## 2022-11-27 ENCOUNTER — Emergency Department
Admission: EM | Admit: 2022-11-27 | Discharge: 2022-11-27 | Disposition: A | Discharge status: Home / Self Care | Payer: Medicare Other | Attending: Emergency Medicine | Admitting: Emergency Medicine

## 2022-11-27 ENCOUNTER — Emergency Department: Payer: Medicare Other

## 2022-11-27 ENCOUNTER — Other Ambulatory Visit: Payer: Self-pay

## 2022-11-27 DIAGNOSIS — Z90722 Acquired absence of ovaries, bilateral: Secondary | ICD-10-CM | POA: Diagnosis not present

## 2022-11-27 DIAGNOSIS — M7989 Other specified soft tissue disorders: Secondary | ICD-10-CM | POA: Diagnosis not present

## 2022-11-27 DIAGNOSIS — Z90710 Acquired absence of both cervix and uterus: Secondary | ICD-10-CM | POA: Diagnosis not present

## 2022-11-27 DIAGNOSIS — S72011D Unspecified intracapsular fracture of right femur, subsequent encounter for closed fracture with routine healing: Secondary | ICD-10-CM | POA: Diagnosis not present

## 2022-11-27 DIAGNOSIS — Z8781 Personal history of (healed) traumatic fracture: Secondary | ICD-10-CM

## 2022-11-27 DIAGNOSIS — M1990 Unspecified osteoarthritis, unspecified site: Secondary | ICD-10-CM | POA: Diagnosis not present

## 2022-11-27 DIAGNOSIS — K589 Irritable bowel syndrome without diarrhea: Secondary | ICD-10-CM | POA: Diagnosis not present

## 2022-11-27 DIAGNOSIS — Z7982 Long term (current) use of aspirin: Secondary | ICD-10-CM | POA: Diagnosis not present

## 2022-11-27 DIAGNOSIS — R2241 Localized swelling, mass and lump, right lower limb: Secondary | ICD-10-CM | POA: Diagnosis not present

## 2022-11-27 DIAGNOSIS — Z8619 Personal history of other infectious and parasitic diseases: Secondary | ICD-10-CM | POA: Diagnosis not present

## 2022-11-27 DIAGNOSIS — F418 Other specified anxiety disorders: Secondary | ICD-10-CM | POA: Diagnosis not present

## 2022-11-27 DIAGNOSIS — I341 Nonrheumatic mitral (valve) prolapse: Secondary | ICD-10-CM | POA: Diagnosis not present

## 2022-11-27 DIAGNOSIS — Z9049 Acquired absence of other specified parts of digestive tract: Secondary | ICD-10-CM | POA: Diagnosis not present

## 2022-11-27 DIAGNOSIS — E7800 Pure hypercholesterolemia, unspecified: Secondary | ICD-10-CM | POA: Diagnosis not present

## 2022-11-27 DIAGNOSIS — M8580 Other specified disorders of bone density and structure, unspecified site: Secondary | ICD-10-CM | POA: Diagnosis not present

## 2022-11-27 DIAGNOSIS — Z923 Personal history of irradiation: Secondary | ICD-10-CM | POA: Diagnosis not present

## 2022-11-27 DIAGNOSIS — M069 Rheumatoid arthritis, unspecified: Secondary | ICD-10-CM | POA: Diagnosis not present

## 2022-11-27 DIAGNOSIS — Z7901 Long term (current) use of anticoagulants: Secondary | ICD-10-CM | POA: Diagnosis not present

## 2022-11-27 DIAGNOSIS — Z87442 Personal history of urinary calculi: Secondary | ICD-10-CM | POA: Diagnosis not present

## 2022-11-27 DIAGNOSIS — C569 Malignant neoplasm of unspecified ovary: Secondary | ICD-10-CM | POA: Diagnosis not present

## 2022-11-27 LAB — BASIC METABOLIC PANEL
Anion gap: 7 (ref 5–15)
BUN: 26 mg/dL — ABNORMAL HIGH (ref 8–23)
CO2: 28 mmol/L (ref 22–32)
Calcium: 9.4 mg/dL (ref 8.9–10.3)
Chloride: 103 mmol/L (ref 98–111)
Creatinine, Ser: 0.68 mg/dL (ref 0.44–1.00)
GFR, Estimated: >60 mL/min (ref >60)
Glucose, Bld: 121 mg/dL — ABNORMAL HIGH (ref 70–99)
Potassium: 4.1 mmol/L (ref 3.5–5.1)
Sodium: 138 mmol/L (ref 135–145)

## 2022-11-27 LAB — CBC
HCT: 41.4 % (ref 36.0–46.0)
Hemoglobin: 13.1 g/dL (ref 12.0–15.0)
MCH: 32.8 pg (ref 26.0–34.0)
MCHC: 31.6 g/dL (ref 30.0–36.0)
MCV: 103.5 fL — ABNORMAL HIGH (ref 80.0–100.0)
Platelets: 340 10*3/uL (ref 150–400)
RBC: 4 MIL/uL (ref 3.87–5.11)
RDW: 15.1 % (ref 11.5–15.5)
WBC: 6.7 10*3/uL (ref 4.0–10.5)
nRBC: 0 % (ref 0.0–0.2)

## 2022-11-27 LAB — D-DIMER, QUANTITATIVE: D-Dimer, Quant: 3.91 ug/mL-FEU — ABNORMAL HIGH (ref 0.00–0.50)

## 2022-11-27 NOTE — ED Provider Notes (Signed)
[facility] Provider Note    Event Date/Time   First MD Initiated Contact with Patient 11/27/22 1845     (approximate)   History   No chief complaint on file.   HPI  [name] [name] is a 72 y.o. female who is status post a right hip fracture on 1/30 who then was diagnosed with a DVT about 2 weeks ago on that leg who comes in with some swelling of her right leg.  Patient reports intermittent swelling of the right leg but today they thought it looked a little bit more than normal and so her physical therapist wanted her to come back in to make sure there was no recurrent blood clot.  She reports being compliant with her Eliquis denies any shortness of breath.  She reports continued postop pain but has had x-rays done on 2/19 that did show everything was in place.  She denies any new numbness any new redness.  Her sutures and staples have all been removed.  She reports working with physical therapy and being able to move it more.  Denies any fevers.  Denies any new falls.  Denies any shortness of breath      Physical Exam   Triage Vital Signs: ED Triage Vitals  Enc Vitals Group     BP 11/27/22 1725 112/83     Pulse Rate 11/27/22 1725 86     Resp 11/27/22 1725 16     Temp 11/27/22 1725 98.2 F (36.8 C)     Temp Source 11/27/22 1725 Oral     SpO2 11/27/22 1725 98 %     Weight 11/27/22 1726 145 lb (65.8 kg)     Height 11/27/22 1726 '5\' 5"'$  (1.651 m)     Head Circumference --      Peak Flow --      Pain Score 11/27/22 1726 3     Pain Loc --      Pain Edu? --      Excl. in [place]? --     Most recent vital signs: Vitals:   11/27/22 1725  BP: 112/83  Pulse: 86  Resp: 16  Temp: 98.2 F (36.8 C)  SpO2: 98%     General: Awake, no distress.  CV:  Good peripheral perfusion.  Resp:  Normal effort.  Abd:  No distention.  Other:  Patient has well-healing wound on her right hip without any erythema or redness.  Her thigh is nice and soft without any redness  or erythema.  Calf is soft without any redness or warmth.  There is some edema noted in the right leg.  No edema noted in the left leg.  She got good distal pulse.  She is able to flex and extend the ankle.  She is able to bend up the right hip but not fully do a straight leg up off the bed but she reports is been improving since the surgery.   ED Results / Procedures / Treatments   Labs (all labs ordered are listed, but only abnormal results are displayed) Labs Reviewed  CBC - Abnormal; Notable for the following components:      Result Value   MCV 103.5 (*)    All other components within normal limits  BASIC METABOLIC PANEL - Abnormal; Notable for the following components:   Glucose, Bld 121 (*)    BUN 26 (*)    All other components within normal limits  D-DIMER, QUANTITATIVE - Abnormal; Notable for the following  components:   D-Dimer, Quant 3.91 (*)    All other components within normal limits     RADIOLOGY I have reviewed the ultrasound personally interpreted no evidence of DVT  PROCEDURES:  Critical Care performed: No  Procedures   MEDICATIONS ORDERED IN ED: Medications - No data to display   IMPRESSION / MDM / [facility] / ED COURSE  I reviewed the triage vital signs and the nursing notes.   Patient's presentation is most consistent with acute presentation with potential threat to life or bodily function.   Patient comes in with right leg swelling, currently on Eliquis.  Differential is DVT.  Postop swelling.  No swelling in the other leg or shortness of breath to suggest CHF.  D-dimer overnight from triage is elevated but this could be elevated from the recent surgery.  CBC reassuring with stable hemoglobin CBC shows normal white count no signs for infection.  BMP shows normal creatinine.  Ultrasound without evidence of DVT.  Patient has good pulse no signs of any infection or compartment syndrome she reports her pain is similar to prior since the surgery  denies any new falls to suggest fractures or needing repeat x-ray.  Patient reassured ultrasound was reassuring we discussed elevation and icing and following up with orthopedics and they expressed understanding felt comfortable with this plan     FINAL CLINICAL IMPRESSION(S) / ED DIAGNOSES   Final diagnoses:  Leg swelling  Status post-operative repair of hip fracture     Rx / DC Orders   ED Discharge Orders     None        Note:  This document was prepared using Dragon voice recognition software and may include unintentional dictation errors.   Vanessa Shongaloo, MD 11/27/22 [phone]

## 2022-11-27 NOTE — ED Notes (Signed)
Pt to ultrasound

## 2022-11-27 NOTE — ED Triage Notes (Addendum)
Pt to ED from home for hip pain. Pt had hip surgery 3 weeks ago. Therapy has been working with her at home and today they noticed swelling in the right lower extremity. Pt leg has been swollen but has gradually gotten larger and they wanted it evaluated for clot Pt does have previous HX of blood clot in same leg and was placed on eliquis for same. Still taking eliquis. Lower leg is obviously  swollen more than the left. No redness and not hot to the touch. Pt is CAOx4 and in no acute distress.

## 2022-11-27 NOTE — ED Notes (Signed)
Called ultrasound, pt will be brought to room 9 after u/s complete. Nurse notified.

## 2022-11-27 NOTE — Discharge Instructions (Addendum)
Your ultrasound was negative there is no signs of any blood clots this time return to the ER if develop worsening symptoms or any other concerns, such as fevers, shortness of breath etc.

## 2022-11-28 ENCOUNTER — Other Ambulatory Visit: Payer: Self-pay | Admitting: Oncology

## 2022-11-30 ENCOUNTER — Inpatient Hospital Stay: Payer: Medicare Other

## 2022-11-30 VITALS — BP 139/85 | HR 74 | Temp 98.0°F | Resp 18 | Ht 65.0 in | Wt 155.4 lb

## 2022-11-30 DIAGNOSIS — Z9049 Acquired absence of other specified parts of digestive tract: Secondary | ICD-10-CM | POA: Diagnosis not present

## 2022-11-30 DIAGNOSIS — K589 Irritable bowel syndrome without diarrhea: Secondary | ICD-10-CM | POA: Diagnosis not present

## 2022-11-30 DIAGNOSIS — M1990 Unspecified osteoarthritis, unspecified site: Secondary | ICD-10-CM | POA: Diagnosis not present

## 2022-11-30 DIAGNOSIS — Z7982 Long term (current) use of aspirin: Secondary | ICD-10-CM | POA: Diagnosis not present

## 2022-11-30 DIAGNOSIS — C786 Secondary malignant neoplasm of retroperitoneum and peritoneum: Secondary | ICD-10-CM | POA: Diagnosis not present

## 2022-11-30 DIAGNOSIS — Z5111 Encounter for antineoplastic chemotherapy: Secondary | ICD-10-CM | POA: Diagnosis not present

## 2022-11-30 DIAGNOSIS — C569 Malignant neoplasm of unspecified ovary: Secondary | ICD-10-CM | POA: Diagnosis not present

## 2022-11-30 DIAGNOSIS — Z923 Personal history of irradiation: Secondary | ICD-10-CM | POA: Diagnosis not present

## 2022-11-30 DIAGNOSIS — D701 Agranulocytosis secondary to cancer chemotherapy: Secondary | ICD-10-CM | POA: Diagnosis not present

## 2022-11-30 DIAGNOSIS — Z90710 Acquired absence of both cervix and uterus: Secondary | ICD-10-CM | POA: Diagnosis not present

## 2022-11-30 DIAGNOSIS — F418 Other specified anxiety disorders: Secondary | ICD-10-CM | POA: Diagnosis not present

## 2022-11-30 DIAGNOSIS — S72011D Unspecified intracapsular fracture of right femur, subsequent encounter for closed fracture with routine healing: Secondary | ICD-10-CM | POA: Diagnosis not present

## 2022-11-30 DIAGNOSIS — E7800 Pure hypercholesterolemia, unspecified: Secondary | ICD-10-CM | POA: Diagnosis not present

## 2022-11-30 DIAGNOSIS — I341 Nonrheumatic mitral (valve) prolapse: Secondary | ICD-10-CM | POA: Diagnosis not present

## 2022-11-30 DIAGNOSIS — Z8619 Personal history of other infectious and parasitic diseases: Secondary | ICD-10-CM | POA: Diagnosis not present

## 2022-11-30 DIAGNOSIS — M8580 Other specified disorders of bone density and structure, unspecified site: Secondary | ICD-10-CM | POA: Diagnosis not present

## 2022-11-30 DIAGNOSIS — Z87442 Personal history of urinary calculi: Secondary | ICD-10-CM | POA: Diagnosis not present

## 2022-11-30 DIAGNOSIS — Z90722 Acquired absence of ovaries, bilateral: Secondary | ICD-10-CM | POA: Diagnosis not present

## 2022-11-30 DIAGNOSIS — G620 Drug-induced polyneuropathy: Secondary | ICD-10-CM | POA: Diagnosis not present

## 2022-11-30 DIAGNOSIS — M069 Rheumatoid arthritis, unspecified: Secondary | ICD-10-CM | POA: Diagnosis not present

## 2022-11-30 LAB — CMP (CANCER CENTER ONLY)
ALT: 25 U/L (ref 0–44)
AST: 18 U/L (ref 15–41)
Albumin: 4 g/dL (ref 3.5–5.0)
Alkaline Phosphatase: 68 U/L (ref 38–126)
Anion gap: 9 (ref 5–15)
BUN: 24 mg/dL — ABNORMAL HIGH (ref 8–23)
CO2: 27 mmol/L (ref 22–32)
Calcium: 9.4 mg/dL (ref 8.9–10.3)
Chloride: 105 mmol/L (ref 98–111)
Creatinine: 0.62 mg/dL (ref 0.44–1.00)
GFR, Estimated: >60 mL/min (ref >60)
Glucose, Bld: 119 mg/dL — ABNORMAL HIGH (ref 70–99)
Potassium: 3.6 mmol/L (ref 3.5–5.1)
Sodium: 141 mmol/L (ref 135–145)
Total Bilirubin: 0.4 mg/dL (ref 0.3–1.2)
Total Protein: 6.8 g/dL (ref 6.5–8.1)

## 2022-11-30 LAB — CBC WITH DIFFERENTIAL (CANCER CENTER ONLY)
Abs Immature Granulocytes: 0.02 10*3/uL (ref 0.00–0.07)
Basophils Absolute: 0 10*3/uL (ref 0.0–0.1)
Basophils Relative: 1 %
Eosinophils Absolute: 0.1 10*3/uL (ref 0.0–0.5)
Eosinophils Relative: 2 %
HCT: 37.3 % (ref 36.0–46.0)
Hemoglobin: 11.9 g/dL — ABNORMAL LOW (ref 12.0–15.0)
Immature Granulocytes: 0 %
Lymphocytes Relative: 14 %
Lymphs Abs: 0.8 10*3/uL (ref 0.7–4.0)
MCH: 32.6 pg (ref 26.0–34.0)
MCHC: 31.9 g/dL (ref 30.0–36.0)
MCV: 102.2 fL — ABNORMAL HIGH (ref 80.0–100.0)
Monocytes Absolute: 0.6 10*3/uL (ref 0.1–1.0)
Monocytes Relative: 10 %
Neutro Abs: 4.4 10*3/uL (ref 1.7–7.7)
Neutrophils Relative %: 73 %
Platelet Count: 273 10*3/uL (ref 150–400)
RBC: 3.65 MIL/uL — ABNORMAL LOW (ref 3.87–5.11)
RDW: 15.2 % (ref 11.5–15.5)
WBC Count: 5.9 10*3/uL (ref 4.0–10.5)
nRBC: 0 % (ref 0.0–0.2)

## 2022-11-30 MED ORDER — SODIUM CHLORIDE 0.9 % IV SOLN
159.4000 mg | Freq: Once | INTRAVENOUS | Status: AC
  Administered 2022-11-30: 150 mg via INTRAVENOUS
  Filled 2022-11-30: qty 15

## 2022-11-30 MED ORDER — DIPHENHYDRAMINE HCL 50 MG/ML IJ SOLN
25.0000 mg | Freq: Once | INTRAMUSCULAR | Status: AC
  Administered 2022-11-30: 25 mg via INTRAVENOUS
  Filled 2022-11-30: qty 1

## 2022-11-30 MED ORDER — HEPARIN SOD (PORK) LOCK FLUSH 100 UNIT/ML IV SOLN
500.0000 [IU] | Freq: Once | INTRAVENOUS | Status: AC | PRN
  Administered 2022-11-30: 500 [IU]

## 2022-11-30 MED ORDER — FAMOTIDINE IN NACL 20-0.9 MG/50ML-% IV SOLN
20.0000 mg | Freq: Once | INTRAVENOUS | Status: AC
  Administered 2022-11-30: 20 mg via INTRAVENOUS
  Filled 2022-11-30: qty 50

## 2022-11-30 MED ORDER — PALONOSETRON HCL INJECTION 0.25 MG/5ML
0.2500 mg | Freq: Once | INTRAVENOUS | Status: AC
  Administered 2022-11-30: 0.25 mg via INTRAVENOUS
  Filled 2022-11-30: qty 5

## 2022-11-30 MED ORDER — SODIUM CHLORIDE 0.9 % IV SOLN
10.0000 mg | Freq: Once | INTRAVENOUS | Status: AC
  Administered 2022-11-30: 10 mg via INTRAVENOUS
  Filled 2022-11-30: qty 1

## 2022-11-30 MED ORDER — SODIUM CHLORIDE 0.9% FLUSH
10.0000 mL | INTRAVENOUS | Status: DC | PRN
  Administered 2022-11-30: 10 mL

## 2022-11-30 MED ORDER — SODIUM CHLORIDE 0.9 % IV SOLN: Freq: Once | INTRAVENOUS | Status: AC

## 2022-11-30 NOTE — Patient Instructions (Signed)
[name] [name]   Discharge Instructions: Thank you for choosing [facility] to provide your oncology and hematology care.   If you have a lab appointment with the [facility], please go directly to the [facility] and check in at the registration area.   Wear comfortable clothing and clothing appropriate for easy access to any Portacath or PICC line.   We strive to give you quality time with your provider. You may need to reschedule your appointment if you arrive late (15 or more minutes).  Arriving late affects you and other patients whose appointments are after yours.  Also, if you miss three or more appointments without notifying the office, you may be dismissed from the clinic at the provider's discretion.      For prescription refill requests, have your pharmacy contact our office and allow 72 hours for refills to be completed.    Today you received the following chemotherapy and/or immunotherapy agents Carboplatin (PARAPLATIN).      To help prevent nausea and vomiting after your treatment, we encourage you to take your nausea medication as directed.  BELOW ARE SYMPTOMS THAT SHOULD BE REPORTED IMMEDIATELY: *FEVER GREATER THAN 100.4 F (38 C) OR HIGHER *CHILLS OR SWEATING *NAUSEA AND VOMITING THAT IS NOT CONTROLLED WITH YOUR NAUSEA MEDICATION *UNUSUAL SHORTNESS OF BREATH *UNUSUAL BRUISING OR BLEEDING *URINARY PROBLEMS (pain or burning when urinating, or frequent urination) *BOWEL PROBLEMS (unusual diarrhea, constipation, pain near the anus) TENDERNESS IN MOUTH AND THROAT WITH OR WITHOUT PRESENCE OF ULCERS (sore throat, sores in mouth, or a toothache) UNUSUAL RASH, SWELLING OR PAIN  UNUSUAL VAGINAL DISCHARGE OR ITCHING   Items with * indicate a potential emergency and should be followed up as soon as possible or go to the Emergency Department if any problems should occur.  Please show the CHEMOTHERAPY ALERT CARD or IMMUNOTHERAPY  ALERT CARD at check-in to the Emergency Department and triage nurse.  Should you have questions after your visit or need to cancel or reschedule your appointment, please contact [facility]  Dept: [phone]  and follow the prompts.  Office hours are 8:00 a.m. to 4:30 p.m. Monday - Friday. Please note that voicemails left after 4:00 p.m. may not be returned until the following business day.  We are closed weekends and major holidays. You have access to a nurse at all times for urgent questions. Please call the main number to the clinic Dept: [phone] and follow the prompts.   For any non-urgent questions, you may also contact your provider using MyChart. We now offer e-Visits for anyone 59 and older to request care online for non-urgent symptoms. For details visit [email or web address].   Also download the MyChart app! Go to the app store, search "MyChart", open the app, select [facility], and log in with your MyChart username and password.  Carboplatin Injection What is this medication? CARBOPLATIN (KAR boe pla tin) treats some types of cancer. It works by slowing down the growth of cancer cells. This medicine may be used for other purposes; ask your health care provider or pharmacist if you have questions. COMMON BRAND NAME(S): Paraplatin What should I tell my care team before I take this medication? They need to know if you have any of these conditions: Blood disorders Hearing problems Kidney disease Recent or ongoing radiation therapy An unusual or allergic reaction to carboplatin, cisplatin, other medications, foods, dyes, or preservatives Pregnant or trying to get pregnant Breast-feeding How should  I use this medication? This medication is injected into a vein. It is given by your care team in a hospital or clinic setting. Talk to your care team about the use of this medication in children. Special care may be needed. Overdosage: If you  think you have taken too much of this medicine contact a poison control center or emergency room at once. NOTE: This medicine is only for you. Do not share this medicine with others. What if I miss a dose? Keep appointments for follow-up doses. It is important not to miss your dose. Call your care team if you are unable to keep an appointment. What may interact with this medication? Medications for seizures Some antibiotics, such as amikacin, gentamicin, neomycin, streptomycin, tobramycin Vaccines This list may not describe all possible interactions. Give your health care provider a list of all the medicines, herbs, non-prescription drugs, or dietary supplements you use. Also tell them if you smoke, drink alcohol, or use illegal drugs. Some items may interact with your medicine. What should I watch for while using this medication? Your condition will be monitored carefully while you are receiving this medication. You may need blood work while taking this medication. This medication may make you feel generally unwell. This is not uncommon, as chemotherapy can affect healthy cells as well as cancer cells. Report any side effects. Continue your course of treatment even though you feel ill unless your care team tells you to stop. In some cases, you may be given additional medications to help with side effects. Follow all directions for their use. This medication may increase your risk of getting an infection. Call your care team for advice if you get a fever, chills, sore throat, or other symptoms of a cold or flu. Do not treat yourself. Try to avoid being around people who are sick. Avoid taking medications that contain aspirin, acetaminophen, ibuprofen, naproxen, or ketoprofen unless instructed by your care team. These medications may hide a fever. Be careful brushing or flossing your teeth or using a toothpick because you may get an infection or bleed more easily. If you have any dental work done, tell  your dentist you are receiving this medication. Talk to your care team if you wish to become pregnant or think you might be pregnant. This medication can cause serious birth defects. Talk to your care team about effective forms of contraception. Do not breast-feed while taking this medication. What side effects may I notice from receiving this medication? Side effects that you should report to your care team as soon as possible: Allergic reactions--skin rash, itching, hives, swelling of the face, lips, tongue, or throat Infection--fever, chills, cough, sore throat, wounds that don't heal, pain or trouble when passing urine, general feeling of discomfort or being unwell Low red blood cell level--unusual weakness or fatigue, dizziness, headache, trouble breathing Pain, tingling, or numbness in the hands or feet, muscle weakness, change in vision, confusion or trouble speaking, loss of balance or coordination, trouble walking, seizures Unusual bruising or bleeding Side effects that usually do not require medical attention (report to your care team if they continue or are bothersome): Hair loss Nausea Unusual weakness or fatigue Vomiting This list may not describe all possible side effects. Call your doctor for medical advice about side effects. You may report side effects to FDA at 1-800-FDA-1088. Where should I keep my medication? This medication is given in a hospital or clinic. It will not be stored at home. NOTE: This sheet is a summary.  It may not cover all possible information. If you have questions about this medicine, talk to your doctor, pharmacist, or health care provider.  2023 Elsevier/Gold Standard (2007-11-11 00:00:00)

## 2022-12-02 DIAGNOSIS — M069 Rheumatoid arthritis, unspecified: Secondary | ICD-10-CM | POA: Diagnosis not present

## 2022-12-02 DIAGNOSIS — Z90710 Acquired absence of both cervix and uterus: Secondary | ICD-10-CM | POA: Diagnosis not present

## 2022-12-02 DIAGNOSIS — Z87442 Personal history of urinary calculi: Secondary | ICD-10-CM | POA: Diagnosis not present

## 2022-12-02 DIAGNOSIS — C569 Malignant neoplasm of unspecified ovary: Secondary | ICD-10-CM | POA: Diagnosis not present

## 2022-12-02 DIAGNOSIS — Z9049 Acquired absence of other specified parts of digestive tract: Secondary | ICD-10-CM | POA: Diagnosis not present

## 2022-12-02 DIAGNOSIS — K589 Irritable bowel syndrome without diarrhea: Secondary | ICD-10-CM | POA: Diagnosis not present

## 2022-12-02 DIAGNOSIS — Z7982 Long term (current) use of aspirin: Secondary | ICD-10-CM | POA: Diagnosis not present

## 2022-12-02 DIAGNOSIS — Z8619 Personal history of other infectious and parasitic diseases: Secondary | ICD-10-CM | POA: Diagnosis not present

## 2022-12-02 DIAGNOSIS — S72011D Unspecified intracapsular fracture of right femur, subsequent encounter for closed fracture with routine healing: Secondary | ICD-10-CM | POA: Diagnosis not present

## 2022-12-02 DIAGNOSIS — Z90722 Acquired absence of ovaries, bilateral: Secondary | ICD-10-CM | POA: Diagnosis not present

## 2022-12-02 DIAGNOSIS — I341 Nonrheumatic mitral (valve) prolapse: Secondary | ICD-10-CM | POA: Diagnosis not present

## 2022-12-02 DIAGNOSIS — Z923 Personal history of irradiation: Secondary | ICD-10-CM | POA: Diagnosis not present

## 2022-12-02 DIAGNOSIS — E7800 Pure hypercholesterolemia, unspecified: Secondary | ICD-10-CM | POA: Diagnosis not present

## 2022-12-02 DIAGNOSIS — M8580 Other specified disorders of bone density and structure, unspecified site: Secondary | ICD-10-CM | POA: Diagnosis not present

## 2022-12-02 DIAGNOSIS — F418 Other specified anxiety disorders: Secondary | ICD-10-CM | POA: Diagnosis not present

## 2022-12-02 DIAGNOSIS — M1990 Unspecified osteoarthritis, unspecified site: Secondary | ICD-10-CM | POA: Diagnosis not present

## 2022-12-03 ENCOUNTER — Telehealth: Payer: Self-pay | Admitting: *Deleted

## 2022-12-03 NOTE — Telephone Encounter (Signed)
     Patient  visit on 11/27/2022  at [facility] was for pain   Have you been able to follow up with your primary care physician? Patient to follow up has needed medcine and transportation  The patient was or was not able to obtain any needed medicine or equipment.  Are there diet recommendations that you are having difficulty following?  Patient expresses understanding of discharge instructions and education provided has no other needs at this time.    [place] [phone] [address] , [place] [address] Email : [email or web address]

## 2022-12-07 DIAGNOSIS — I341 Nonrheumatic mitral (valve) prolapse: Secondary | ICD-10-CM | POA: Diagnosis not present

## 2022-12-07 DIAGNOSIS — Z923 Personal history of irradiation: Secondary | ICD-10-CM | POA: Diagnosis not present

## 2022-12-07 DIAGNOSIS — Z8619 Personal history of other infectious and parasitic diseases: Secondary | ICD-10-CM | POA: Diagnosis not present

## 2022-12-07 DIAGNOSIS — Z90710 Acquired absence of both cervix and uterus: Secondary | ICD-10-CM | POA: Diagnosis not present

## 2022-12-07 DIAGNOSIS — Z9181 History of falling: Secondary | ICD-10-CM | POA: Diagnosis not present

## 2022-12-07 DIAGNOSIS — M8580 Other specified disorders of bone density and structure, unspecified site: Secondary | ICD-10-CM | POA: Diagnosis not present

## 2022-12-07 DIAGNOSIS — S72011D Unspecified intracapsular fracture of right femur, subsequent encounter for closed fracture with routine healing: Secondary | ICD-10-CM | POA: Diagnosis not present

## 2022-12-07 DIAGNOSIS — Z87442 Personal history of urinary calculi: Secondary | ICD-10-CM | POA: Diagnosis not present

## 2022-12-07 DIAGNOSIS — W19XXXD Unspecified fall, subsequent encounter: Secondary | ICD-10-CM | POA: Diagnosis not present

## 2022-12-07 DIAGNOSIS — Z7982 Long term (current) use of aspirin: Secondary | ICD-10-CM | POA: Diagnosis not present

## 2022-12-07 DIAGNOSIS — F418 Other specified anxiety disorders: Secondary | ICD-10-CM | POA: Diagnosis not present

## 2022-12-07 DIAGNOSIS — Z96641 Presence of right artificial hip joint: Secondary | ICD-10-CM | POA: Diagnosis not present

## 2022-12-07 DIAGNOSIS — Z9049 Acquired absence of other specified parts of digestive tract: Secondary | ICD-10-CM | POA: Diagnosis not present

## 2022-12-07 DIAGNOSIS — Z90722 Acquired absence of ovaries, bilateral: Secondary | ICD-10-CM | POA: Diagnosis not present

## 2022-12-07 DIAGNOSIS — E7800 Pure hypercholesterolemia, unspecified: Secondary | ICD-10-CM | POA: Diagnosis not present

## 2022-12-07 DIAGNOSIS — M069 Rheumatoid arthritis, unspecified: Secondary | ICD-10-CM | POA: Diagnosis not present

## 2022-12-07 DIAGNOSIS — M1990 Unspecified osteoarthritis, unspecified site: Secondary | ICD-10-CM | POA: Diagnosis not present

## 2022-12-07 DIAGNOSIS — C569 Malignant neoplasm of unspecified ovary: Secondary | ICD-10-CM | POA: Diagnosis not present

## 2022-12-07 DIAGNOSIS — K589 Irritable bowel syndrome without diarrhea: Secondary | ICD-10-CM | POA: Diagnosis not present

## 2022-12-08 ENCOUNTER — Ambulatory Visit
Admission: RE | Admit: 2022-12-08 | Discharge: 2022-12-08 | Disposition: A | Discharge status: Home / Self Care | Payer: Medicare Other | Source: Ambulatory Visit | Attending: Nurse Practitioner | Admitting: Nurse Practitioner

## 2022-12-08 DIAGNOSIS — C569 Malignant neoplasm of unspecified ovary: Secondary | ICD-10-CM

## 2022-12-08 DIAGNOSIS — C786 Secondary malignant neoplasm of retroperitoneum and peritoneum: Secondary | ICD-10-CM | POA: Diagnosis not present

## 2022-12-08 DIAGNOSIS — I700 Atherosclerosis of aorta: Secondary | ICD-10-CM | POA: Insufficient documentation

## 2022-12-08 DIAGNOSIS — C482 Malignant neoplasm of peritoneum, unspecified: Secondary | ICD-10-CM | POA: Diagnosis not present

## 2022-12-08 MED ORDER — HEPARIN SOD (PORK) LOCK FLUSH 100 UNIT/ML IV SOLN
500.0000 [IU] | Freq: Once | INTRAVENOUS | Status: AC
  Administered 2022-12-08: 500 [IU] via INTRAVENOUS

## 2022-12-08 MED ORDER — HEPARIN SOD (PORK) LOCK FLUSH 100 UNIT/ML IV SOLN
INTRAVENOUS | Status: AC
  Filled 2022-12-08: qty 5

## 2022-12-08 MED ORDER — IOHEXOL 300 MG/ML  SOLN
100.0000 mL | Freq: Once | INTRAMUSCULAR | Status: AC | PRN
  Administered 2022-12-08: 100 mL via INTRAVENOUS

## 2022-12-12 ENCOUNTER — Other Ambulatory Visit: Payer: Self-pay | Admitting: Oncology

## 2022-12-12 DIAGNOSIS — C569 Malignant neoplasm of unspecified ovary: Secondary | ICD-10-CM

## 2022-12-14 ENCOUNTER — Inpatient Hospital Stay: Payer: Medicare Other

## 2022-12-14 ENCOUNTER — Other Ambulatory Visit (HOSPITAL_BASED_OUTPATIENT_CLINIC_OR_DEPARTMENT_OTHER): Payer: Self-pay

## 2022-12-14 ENCOUNTER — Encounter: Payer: Self-pay | Admitting: *Deleted

## 2022-12-14 ENCOUNTER — Inpatient Hospital Stay: Payer: Medicare Other | Attending: Oncology | Admitting: Oncology

## 2022-12-14 VITALS — BP 124/74 | HR 90 | Temp 98.2°F | Resp 18 | Ht 65.0 in | Wt 160.0 lb

## 2022-12-14 DIAGNOSIS — D701 Agranulocytosis secondary to cancer chemotherapy: Secondary | ICD-10-CM | POA: Insufficient documentation

## 2022-12-14 DIAGNOSIS — Z7982 Long term (current) use of aspirin: Secondary | ICD-10-CM | POA: Diagnosis not present

## 2022-12-14 DIAGNOSIS — K589 Irritable bowel syndrome without diarrhea: Secondary | ICD-10-CM | POA: Diagnosis not present

## 2022-12-14 DIAGNOSIS — Z87442 Personal history of urinary calculi: Secondary | ICD-10-CM | POA: Diagnosis not present

## 2022-12-14 DIAGNOSIS — C786 Secondary malignant neoplasm of retroperitoneum and peritoneum: Secondary | ICD-10-CM | POA: Insufficient documentation

## 2022-12-14 DIAGNOSIS — G620 Drug-induced polyneuropathy: Secondary | ICD-10-CM | POA: Insufficient documentation

## 2022-12-14 DIAGNOSIS — Z90722 Acquired absence of ovaries, bilateral: Secondary | ICD-10-CM | POA: Diagnosis not present

## 2022-12-14 DIAGNOSIS — M069 Rheumatoid arthritis, unspecified: Secondary | ICD-10-CM | POA: Diagnosis not present

## 2022-12-14 DIAGNOSIS — Z8619 Personal history of other infectious and parasitic diseases: Secondary | ICD-10-CM | POA: Diagnosis not present

## 2022-12-14 DIAGNOSIS — E7800 Pure hypercholesterolemia, unspecified: Secondary | ICD-10-CM | POA: Diagnosis not present

## 2022-12-14 DIAGNOSIS — C569 Malignant neoplasm of unspecified ovary: Secondary | ICD-10-CM

## 2022-12-14 DIAGNOSIS — M8580 Other specified disorders of bone density and structure, unspecified site: Secondary | ICD-10-CM | POA: Diagnosis not present

## 2022-12-14 DIAGNOSIS — Z452 Encounter for adjustment and management of vascular access device: Secondary | ICD-10-CM | POA: Diagnosis not present

## 2022-12-14 DIAGNOSIS — M1990 Unspecified osteoarthritis, unspecified site: Secondary | ICD-10-CM | POA: Diagnosis not present

## 2022-12-14 DIAGNOSIS — S72011D Unspecified intracapsular fracture of right femur, subsequent encounter for closed fracture with routine healing: Secondary | ICD-10-CM | POA: Diagnosis not present

## 2022-12-14 DIAGNOSIS — Z95828 Presence of other vascular implants and grafts: Secondary | ICD-10-CM

## 2022-12-14 DIAGNOSIS — Z923 Personal history of irradiation: Secondary | ICD-10-CM | POA: Diagnosis not present

## 2022-12-14 DIAGNOSIS — Z9049 Acquired absence of other specified parts of digestive tract: Secondary | ICD-10-CM | POA: Diagnosis not present

## 2022-12-14 DIAGNOSIS — F418 Other specified anxiety disorders: Secondary | ICD-10-CM | POA: Diagnosis not present

## 2022-12-14 DIAGNOSIS — I341 Nonrheumatic mitral (valve) prolapse: Secondary | ICD-10-CM | POA: Diagnosis not present

## 2022-12-14 DIAGNOSIS — Z90710 Acquired absence of both cervix and uterus: Secondary | ICD-10-CM | POA: Diagnosis not present

## 2022-12-14 LAB — CMP (CANCER CENTER ONLY)
ALT: 25 U/L (ref 0–44)
AST: 18 U/L (ref 15–41)
Albumin: 4.1 g/dL (ref 3.5–5.0)
Alkaline Phosphatase: 65 U/L (ref 38–126)
Anion gap: 9 (ref 5–15)
BUN: 20 mg/dL (ref 8–23)
CO2: 26 mmol/L (ref 22–32)
Calcium: 9.6 mg/dL (ref 8.9–10.3)
Chloride: 104 mmol/L (ref 98–111)
Creatinine: 0.69 mg/dL (ref 0.44–1.00)
GFR, Estimated: >60 mL/min (ref >60)
Glucose, Bld: 150 mg/dL — ABNORMAL HIGH (ref 70–99)
Potassium: 4.1 mmol/L (ref 3.5–5.1)
Sodium: 139 mmol/L (ref 135–145)
Total Bilirubin: 0.5 mg/dL (ref 0.3–1.2)
Total Protein: 7 g/dL (ref 6.5–8.1)

## 2022-12-14 LAB — CBC WITH DIFFERENTIAL (CANCER CENTER ONLY)
Abs Immature Granulocytes: 0.01 10*3/uL (ref 0.00–0.07)
Basophils Absolute: 0 10*3/uL (ref 0.0–0.1)
Basophils Relative: 1 %
Eosinophils Absolute: 0.1 10*3/uL (ref 0.0–0.5)
Eosinophils Relative: 2 %
HCT: 38.7 % (ref 36.0–46.0)
Hemoglobin: 12.5 g/dL (ref 12.0–15.0)
Immature Granulocytes: 0 %
Lymphocytes Relative: 16 %
Lymphs Abs: 0.8 10*3/uL (ref 0.7–4.0)
MCH: 33.2 pg (ref 26.0–34.0)
MCHC: 32.3 g/dL (ref 30.0–36.0)
MCV: 102.7 fL — ABNORMAL HIGH (ref 80.0–100.0)
Monocytes Absolute: 0.5 10*3/uL (ref 0.1–1.0)
Monocytes Relative: 9 %
Neutro Abs: 3.7 10*3/uL (ref 1.7–7.7)
Neutrophils Relative %: 72 %
Platelet Count: 279 10*3/uL (ref 150–400)
RBC: 3.77 MIL/uL — ABNORMAL LOW (ref 3.87–5.11)
RDW: 14.7 % (ref 11.5–15.5)
WBC Count: 5.1 10*3/uL (ref 4.0–10.5)
nRBC: 0 % (ref 0.0–0.2)

## 2022-12-14 MED ORDER — APIXABAN 5 MG PO TABS
5.0000 mg | ORAL_TABLET | Freq: Two times a day (BID) | ORAL | 0 refills | Status: DC
  Filled 2022-12-14: qty 60, 30d supply, fill #0

## 2022-12-14 MED ORDER — SODIUM CHLORIDE 0.9% FLUSH
10.0000 mL | INTRAVENOUS | Status: DC | PRN
  Administered 2022-12-14: 10 mL via INTRAVENOUS

## 2022-12-14 MED ORDER — APIXABAN 5 MG PO TABS: 5.0000 mg | ORAL_TABLET | Freq: Two times a day (BID) | ORAL | 0 refills | Status: DC

## 2022-12-14 MED ORDER — HEPARIN SOD (PORK) LOCK FLUSH 100 UNIT/ML IV SOLN
500.0000 [IU] | Freq: Once | INTRAVENOUS | Status: AC
  Administered 2022-12-14: 500 [IU] via INTRAVENOUS

## 2022-12-14 NOTE — Patient Instructions (Signed)

## 2022-12-14 NOTE — Progress Notes (Signed)
[facility] OFFICE PROGRESS NOTE   Diagnosis: Ovarian cancer  INTERVAL HISTORY:   [name] [name] returns as scheduled.  She continues apixaban anticoagulation.  Right leg edema has improved.  She was last treated with carboplatin on 11/30/2022.  No new complaint.  Objective:  Vital signs in last 24 hours:  Blood pressure 124/74, pulse 90, temperature 98.2 F (36.8 C), resp. rate 18, height '5\' 5"'$  (1.651 m), weight 160 lb (72.6 kg), SpO2 99 %.    HEENT: No thrush or ulcers Resp: Lungs clear bilaterally Cardio: Regular rate and rhythm GI: Nontender, no mass, no apparent ascites, no hepatosplenomegaly Vascular: Trace edema at the right lower leg    Portacath/PICC-without erythema  Lab Results:  Lab Results  Component Value Date   WBC 5.1 12/14/2022   HGB 12.5 12/14/2022   HCT 38.7 12/14/2022   MCV 102.7 (H) 12/14/2022   PLT 279 12/14/2022   NEUTROABS 3.7 12/14/2022    CMP  Lab Results  Component Value Date   NA 141 11/30/2022   K 3.6 11/30/2022   CL 105 11/30/2022   CO2 27 11/30/2022   GLUCOSE 119 (H) 11/30/2022   BUN 24 (H) 11/30/2022   CREATININE 0.62 11/30/2022   CALCIUM 9.4 11/30/2022   PROT 6.8 11/30/2022   ALBUMIN 4.0 11/30/2022   AST 18 11/30/2022   ALT 25 11/30/2022   ALKPHOS 68 11/30/2022   BILITOT 0.4 11/30/2022   GFRNONAA >60 11/30/2022   GFRAA >60 03/13/2020    Lab Results  Component Value Date   CEA 0.8 07/13/2012   CA125 8 08/18/2016     Medications: I have reviewed the patient's current medications.   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 A 5 mm nodules remain on the right diaphragm. - TumorNext paired germline/tumor analyses: No somatic variants detected, germline CHEK2      VUS      Cycle 1 of adjuvant Taxol/carboplatin chemotherapy initiated on  09/19/2012.   The CA 125 normalized.   She completed day 15 of cycle 6 on 02/06/2013.   Restaging CT evaluation 03/29/2013 showed no evidence of metastatic disease in the chest. There was marked improvement in appearance/resolution of previous described peritoneal/omental disease. There was no convincing evidence of residual disease. There was minimal increased density in the region of the omentum favored to be treatment-related. There was no pelvic adenopathy. CA125 3.2 on 02/19/2014. 08/18/2016 CA-125 8 CT abdomen/pelvis 08/25/2016-solitary new enlarged right external iliac lymph node measuring 2.2 cm. Biopsy 09/07/2016-adenocarcinoma consistent with high-grade serous carcinoma. PET scan 09/22/2016-malignant range FDG uptake associated with the enlarged right external iliac lymph node compatible with metastatic adenopathy.  No additional sites of metastatic disease identified. Radiation 10/25/2016-12/01/2016 PET scan 03/28/2017-previously enlarged hypermetabolic right external iliac node-normal in size with resolution of hypermetabolic activity, no active malignancy identified PET scan 05/15/2018-no evidence of recurrent or metastatic disease, no hypermetabolic lymph nodes CT abdomen/pelvis 09/19/2019-mild increase in the size of several left upper quadrant peritoneal nodules measuring up to 11 mm.  No other sites of metastatic disease identified.  No ascites. CT abdomen/pelvis 03/14/2020-slight enlargement of several left upper quadrant peritoneal nodules, no ascites, no other evidence of disease progression CT abdomen/pelvis 09/12/2020 peritoneal nodularity/omental caking predominantly in the left upper/mid abdomen, mildly progressive.  Largest implant in the left upper abdomen adjacent to the stomach now measures 17 mm, previously 13 mm.  Overall volume of peritoneal  disease has mildly progressed. CT abdomen/pelvis 01/13/2021- 4 mm right ureteral calculus with moderate right hydronephrosis and upper  right hydroureter, progressive omental nodularity, stable calcified and partially calcified right pelvic nodules CT abdomen/pelvis 05/13/2021-enlargement of left upper quadrant omental nodules and a peritoneal nodule at the right iliac fossa, no ascites CT abdomen/pelvis 08/14/2021-mild increase in size of omental nodules, no ascites, no new site of metastatic disease Taxol/carboplatin 09/09/2021, 09/15/2021, 09/22/2021 Taxol/carboplatin 10/07/2021, 10/13/2021, 10/20/2021 Taxol/Carboplatin 11/03/2021, 11/10/2021, 11/17/2021 CT 11/23/2021-decrease in peritoneal metastases, no new or progressive disease, airspace opacity at both lung bases Taxol/carboplatin 12/01/2021, 12/08/2021, 12/15/2021 Taxol/Carboplatin every 2 weeks beginning 12/29/2021 Carboplatin alone 01/26/2022, Taxol held due to neuropathy Carboplatin alone 02/09/2022, Taxol held due to neuropathy Carboplatin alone 02/23/2022, Taxol held due to neuropathy Carboplatin alone 03/09/2022, Taxol held due to neuropathy CTs 03/25/2022-no significant change in omental, mesenteric disease.  No new or clearly progressive findings.  Previously noted patchy areas of consolidative opacity at the lung bases have resolved. Treatment break beginning 03/26/2022 CT/pelvis 08/10/2022-increased size of peritoneal nodules, increased small volume ascites, mild right lower lobe pleural nodularity Cycle 1 salvage carboplatin 09/14/2022 (weekly x 3 followed by 1 week break) Cycle 2 salvage carboplatin 10/12/2022 (weekly x 3 followed by 1 week break) Cycle 3 salvage carboplatin 11/16/2022 (weekly x 3 followed by 1 week break) CT abdomen/pelvis 12/08/2022-mild increase in peritoneal carcinomatosis   2. Low abdomen/suprapubic pain prior to the exploratory laparotomy-likely secondary to omental/pelvic tumor; persistent mild pain in the lower abdomen   3. Chronic neck and back pain.   4. Anxiety -persistent despite Lexapro and Xanax. She has been evaluated by psychiatry. Currently on  Lexapro. 5. Status post Port-A-Cath placement 09/22/2012. The Port-A-Cath was removed on 04/03/2013.   6. Neutropenia secondary to chemotherapy- day 15 cycle 1 and cycle 3. Taxol/carboplatin not given secondary to neutropenia.    7. Herpes zoster involving a right thoracic dermatome July 2015. She completed a course of Valtrex. 8. Nodular bony prominence at the left pelvis on rectal exam 06/05/2015-likely a benign finding 9.  Right ureter stone/hydroureteronephrosis on CT 01/13/2021-referred to urology; status post right ureteroscopy with stone extraction and ureteral stent placement.  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  Right hip fracture following a fall 138 2024-status post 12.  Acute localized thrombus right proximal femoral vein 11/16/2022 Eliquis initiated    Disposition: [name] [name] has been treated with salvage carboplatin therapy since December 2023.  The CT on 12/08/2022 reveals enlargement of multiple peritoneal nodules.  I discussed the CT findings and reviewed the images with [name] [name].  Carboplatin will be discontinued.  We discussed treatment options.  We will submit the peritoneal biopsy tissue for HRD, NGS, and folate receptor alpha testing.  She will return for an office visit and further discussion in 2 weeks.  [name], MD  12/14/2022  9:03 AM

## 2022-12-14 NOTE — Progress Notes (Signed)
Patient seen by Dr. Benay Spice today  Vitals are within treatment parameters.  Labs reviewed by Dr. Benay Spice  Still pending--N/A  Per physician team, patient will not be receiving treatment today.

## 2022-12-15 ENCOUNTER — Other Ambulatory Visit: Payer: Self-pay | Admitting: *Deleted

## 2022-12-15 LAB — CA 125: Cancer Antigen (CA) 125: 44.3 U/mL — ABNORMAL HIGH (ref 0.0–38.1)

## 2022-12-17 ENCOUNTER — Other Ambulatory Visit (HOSPITAL_BASED_OUTPATIENT_CLINIC_OR_DEPARTMENT_OTHER): Payer: Self-pay

## 2022-12-20 DIAGNOSIS — M069 Rheumatoid arthritis, unspecified: Secondary | ICD-10-CM | POA: Diagnosis not present

## 2022-12-20 DIAGNOSIS — I341 Nonrheumatic mitral (valve) prolapse: Secondary | ICD-10-CM | POA: Diagnosis not present

## 2022-12-20 DIAGNOSIS — Z90710 Acquired absence of both cervix and uterus: Secondary | ICD-10-CM | POA: Diagnosis not present

## 2022-12-20 DIAGNOSIS — F418 Other specified anxiety disorders: Secondary | ICD-10-CM | POA: Diagnosis not present

## 2022-12-20 DIAGNOSIS — Z90722 Acquired absence of ovaries, bilateral: Secondary | ICD-10-CM | POA: Diagnosis not present

## 2022-12-20 DIAGNOSIS — Z923 Personal history of irradiation: Secondary | ICD-10-CM | POA: Diagnosis not present

## 2022-12-20 DIAGNOSIS — K589 Irritable bowel syndrome without diarrhea: Secondary | ICD-10-CM | POA: Diagnosis not present

## 2022-12-20 DIAGNOSIS — M8580 Other specified disorders of bone density and structure, unspecified site: Secondary | ICD-10-CM | POA: Diagnosis not present

## 2022-12-20 DIAGNOSIS — C569 Malignant neoplasm of unspecified ovary: Secondary | ICD-10-CM | POA: Diagnosis not present

## 2022-12-20 DIAGNOSIS — Z87442 Personal history of urinary calculi: Secondary | ICD-10-CM | POA: Diagnosis not present

## 2022-12-20 DIAGNOSIS — Z9049 Acquired absence of other specified parts of digestive tract: Secondary | ICD-10-CM | POA: Diagnosis not present

## 2022-12-20 DIAGNOSIS — M1990 Unspecified osteoarthritis, unspecified site: Secondary | ICD-10-CM | POA: Diagnosis not present

## 2022-12-20 DIAGNOSIS — E7800 Pure hypercholesterolemia, unspecified: Secondary | ICD-10-CM | POA: Diagnosis not present

## 2022-12-20 DIAGNOSIS — S72011D Unspecified intracapsular fracture of right femur, subsequent encounter for closed fracture with routine healing: Secondary | ICD-10-CM | POA: Diagnosis not present

## 2022-12-20 DIAGNOSIS — Z7982 Long term (current) use of aspirin: Secondary | ICD-10-CM | POA: Diagnosis not present

## 2022-12-20 DIAGNOSIS — Z8619 Personal history of other infectious and parasitic diseases: Secondary | ICD-10-CM | POA: Diagnosis not present

## 2022-12-21 ENCOUNTER — Inpatient Hospital Stay: Payer: Medicare Other

## 2022-12-24 ENCOUNTER — Encounter (HOSPITAL_COMMUNITY): Payer: Self-pay | Admitting: Oncology

## 2022-12-26 ENCOUNTER — Other Ambulatory Visit: Payer: Self-pay | Admitting: Oncology

## 2022-12-27 ENCOUNTER — Inpatient Hospital Stay: Payer: Medicare Other | Admitting: Oncology

## 2022-12-27 ENCOUNTER — Encounter: Payer: Self-pay | Admitting: *Deleted

## 2022-12-27 DIAGNOSIS — E7800 Pure hypercholesterolemia, unspecified: Secondary | ICD-10-CM | POA: Diagnosis not present

## 2022-12-27 DIAGNOSIS — C569 Malignant neoplasm of unspecified ovary: Secondary | ICD-10-CM | POA: Diagnosis not present

## 2022-12-27 DIAGNOSIS — Z8619 Personal history of other infectious and parasitic diseases: Secondary | ICD-10-CM | POA: Diagnosis not present

## 2022-12-27 DIAGNOSIS — Z90722 Acquired absence of ovaries, bilateral: Secondary | ICD-10-CM | POA: Diagnosis not present

## 2022-12-27 DIAGNOSIS — M1990 Unspecified osteoarthritis, unspecified site: Secondary | ICD-10-CM | POA: Diagnosis not present

## 2022-12-27 DIAGNOSIS — S72011D Unspecified intracapsular fracture of right femur, subsequent encounter for closed fracture with routine healing: Secondary | ICD-10-CM | POA: Diagnosis not present

## 2022-12-27 DIAGNOSIS — Z7982 Long term (current) use of aspirin: Secondary | ICD-10-CM | POA: Diagnosis not present

## 2022-12-27 DIAGNOSIS — I341 Nonrheumatic mitral (valve) prolapse: Secondary | ICD-10-CM | POA: Diagnosis not present

## 2022-12-27 DIAGNOSIS — Z9049 Acquired absence of other specified parts of digestive tract: Secondary | ICD-10-CM | POA: Diagnosis not present

## 2022-12-27 DIAGNOSIS — K589 Irritable bowel syndrome without diarrhea: Secondary | ICD-10-CM | POA: Diagnosis not present

## 2022-12-27 DIAGNOSIS — F418 Other specified anxiety disorders: Secondary | ICD-10-CM | POA: Diagnosis not present

## 2022-12-27 DIAGNOSIS — M069 Rheumatoid arthritis, unspecified: Secondary | ICD-10-CM | POA: Diagnosis not present

## 2022-12-27 DIAGNOSIS — M8580 Other specified disorders of bone density and structure, unspecified site: Secondary | ICD-10-CM | POA: Diagnosis not present

## 2022-12-27 DIAGNOSIS — Z923 Personal history of irradiation: Secondary | ICD-10-CM | POA: Diagnosis not present

## 2022-12-27 DIAGNOSIS — Z87442 Personal history of urinary calculi: Secondary | ICD-10-CM | POA: Diagnosis not present

## 2022-12-27 DIAGNOSIS — Z90710 Acquired absence of both cervix and uterus: Secondary | ICD-10-CM | POA: Diagnosis not present

## 2022-12-27 NOTE — Progress Notes (Signed)
Faxed notification from [facility] that tissue has been received.

## 2022-12-28 ENCOUNTER — Inpatient Hospital Stay: Payer: Medicare Other

## 2022-12-28 ENCOUNTER — Telehealth: Payer: Self-pay

## 2022-12-28 DIAGNOSIS — C569 Malignant neoplasm of unspecified ovary: Secondary | ICD-10-CM

## 2022-12-28 NOTE — Telephone Encounter (Signed)
[name] [name] called to reschedule her appointment on 4/5 with Dr.Tucker. She states she fell and broke her hip. First available appointment is 5/24 @ 3:15. Pt agreed to date/time

## 2022-12-31 ENCOUNTER — Telehealth: Payer: Self-pay | Admitting: *Deleted

## 2023-01-05 DIAGNOSIS — C569 Malignant neoplasm of unspecified ovary: Secondary | ICD-10-CM | POA: Diagnosis not present

## 2023-01-06 ENCOUNTER — Encounter (HOSPITAL_COMMUNITY): Payer: Self-pay | Admitting: Oncology

## 2023-01-07 ENCOUNTER — Inpatient Hospital Stay: Payer: Medicare Other

## 2023-01-07 ENCOUNTER — Encounter (HOSPITAL_COMMUNITY): Payer: Self-pay | Admitting: Oncology

## 2023-01-07 ENCOUNTER — Encounter: Payer: Self-pay | Admitting: Nurse Practitioner

## 2023-01-07 ENCOUNTER — Inpatient Hospital Stay: Payer: Medicare Other | Admitting: Gynecologic Oncology

## 2023-01-07 ENCOUNTER — Ambulatory Visit: Payer: Medicare Other | Admitting: Nurse Practitioner

## 2023-01-07 ENCOUNTER — Other Ambulatory Visit (HOSPITAL_BASED_OUTPATIENT_CLINIC_OR_DEPARTMENT_OTHER): Payer: Self-pay

## 2023-01-07 ENCOUNTER — Inpatient Hospital Stay: Payer: Medicare Other | Attending: Oncology | Admitting: Nurse Practitioner

## 2023-01-07 VITALS — BP 120/76 | HR 88 | Temp 98.1°F | Resp 18 | Ht 65.0 in | Wt 162.0 lb

## 2023-01-07 DIAGNOSIS — C569 Malignant neoplasm of unspecified ovary: Secondary | ICD-10-CM | POA: Insufficient documentation

## 2023-01-07 DIAGNOSIS — Z95828 Presence of other vascular implants and grafts: Secondary | ICD-10-CM

## 2023-01-07 DIAGNOSIS — D701 Agranulocytosis secondary to cancer chemotherapy: Secondary | ICD-10-CM | POA: Insufficient documentation

## 2023-01-07 DIAGNOSIS — M549 Dorsalgia, unspecified: Secondary | ICD-10-CM | POA: Diagnosis not present

## 2023-01-07 DIAGNOSIS — G620 Drug-induced polyneuropathy: Secondary | ICD-10-CM | POA: Insufficient documentation

## 2023-01-07 DIAGNOSIS — C786 Secondary malignant neoplasm of retroperitoneum and peritoneum: Secondary | ICD-10-CM | POA: Diagnosis not present

## 2023-01-07 DIAGNOSIS — M542 Cervicalgia: Secondary | ICD-10-CM | POA: Diagnosis not present

## 2023-01-07 DIAGNOSIS — G8929 Other chronic pain: Secondary | ICD-10-CM | POA: Insufficient documentation

## 2023-01-07 DIAGNOSIS — Z452 Encounter for adjustment and management of vascular access device: Secondary | ICD-10-CM | POA: Insufficient documentation

## 2023-01-07 MED ORDER — HEPARIN SOD (PORK) LOCK FLUSH 100 UNIT/ML IV SOLN
500.0000 [IU] | Freq: Once | INTRAVENOUS | Status: AC
  Administered 2023-01-07: 500 [IU] via INTRAVENOUS

## 2023-01-07 MED ORDER — TAMOXIFEN CITRATE 20 MG PO TABS
20.0000 mg | ORAL_TABLET | Freq: Every day | ORAL | 3 refills | Status: DC
Start: 2023-01-07 — End: 2023-04-01
  Filled 2023-01-07: qty 30, 30d supply, fill #0
  Filled 2023-01-31: qty 30, 30d supply, fill #1
  Filled 2023-03-15: qty 30, 30d supply, fill #2

## 2023-01-07 MED ORDER — SODIUM CHLORIDE 0.9% FLUSH
10.0000 mL | INTRAVENOUS | Status: DC | PRN
  Administered 2023-01-07: 10 mL via INTRAVENOUS

## 2023-01-07 NOTE — Patient Instructions (Signed)

## 2023-01-07 NOTE — Progress Notes (Signed)
[facility] OFF[facility] PROGRESS [facility]   Diagnosis: Ovarian cancer  INTERVAL HISTORY:   [name] [name] returns as scheduled.  She notes intermittent swelling of the right leg.  No bleeding.  No abdominal pain.  No constipation or diarrhea.  No nausea or vomiting.  Objective:  Vital signs in last 24 hours:  Blood pressure 120/76, pulse 88, temperature 98.1 F (36.7 C), temperature source Oral, resp. rate 18, height 5\' 5"  (1.651 m), weight 162 lb (73.5 kg), SpO2 98 %.    HEENT: No thrush or ulcers. Resp: Lungs clear bilaterally. Cardio: Regular rate and rhythm. GI: Abdomen soft and nontender.  No apparent ascites.  No hepatosplenomegaly. Vascular: Trace edema right leg. Port-A-Cath without erythema.  Lab Results:  Lab Results  Component Value Date   WBC 5.1 12/14/2022   HGB 12.5 12/14/2022   HCT 38.7 12/14/2022   MCV 102.7 (H) 12/14/2022   PLT 279 12/14/2022   NEUTROABS 3.7 12/14/2022    Imaging:  No results found.  Medications: I have reviewed the patient's current medications.  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 A 5 mm nodules remain on the right diaphragm. - TumorNext paired germline/tumor analyses: No somatic variants detected, germline CHEK2      VUS      Cycle 1 of adjuvant Taxol/carboplatin chemotherapy initiated on 09/19/2012.   The CA 125 normalized.   She completed day 15 of cycle 6 on 02/06/2013.   Restaging CT evaluation 03/29/2013 showed no evidence of metastatic disease in the chest. There was marked improvement in appearance/resolution of previous described peritoneal/omental disease. There was no convincing evidence of residual disease. There was minimal increased density in the region of the omentum favored to be treatment-related. There was no pelvic adenopathy. CA125 3.2  on 02/19/2014. 08/18/2016 CA-125 8 CT abdomen/pelvis 08/25/2016-solitary new enlarged right external iliac lymph node measuring 2.2 cm. Biopsy 09/07/2016-adenocarcinoma consistent with high-grade serous carcinoma.  ER +90%, PR -0%; foundation 1-HRD not detected, microsatellite status cannot be determined, tumor mutation burden 8 PET scan 09/22/2016-malignant range FDG uptake associated with the enlarged right external iliac lymph node compatible with metastatic adenopathy.  No additional sites of metastatic disease identified. Radiation 10/25/2016-12/01/2016 PET scan 03/28/2017-previously enlarged hypermetabolic right external iliac node-normal in size with resolution of hypermetabolic activity, no active malignancy identified PET scan 05/15/2018-no evidence of recurrent or metastatic disease, no hypermetabolic lymph nodes CT abdomen/pelvis 09/19/2019-mild increase in the size of several left upper quadrant peritoneal nodules measuring up to 11 mm.  No other sites of metastatic disease identified.  No ascites. CT abdomen/pelvis 03/14/2020-slight enlargement of several left upper quadrant peritoneal nodules, no ascites, no other evidence of disease progression CT abdomen/pelvis 09/12/2020 peritoneal nodularity/omental caking predominantly in the left upper/mid abdomen, mildly progressive.  Largest implant in the left upper abdomen adjacent to the stomach now measures 17 mm, previously 13 mm.  Overall volume of peritoneal disease has mildly progressed. CT abdomen/pelvis 01/13/2021- 4 mm right ureteral calculus with moderate right hydronephrosis and upper right hydroureter, progressive omental nodularity, stable calcified and partially calcified right pelvic nodules CT abdomen/pelvis 05/13/2021-enlargement of left upper quadrant omental nodules and a peritoneal nodule at the right iliac fossa, no ascites CT abdomen/pelvis 08/14/2021-mild increase in size of omental nodules, no ascites, no new site of metastatic  disease Taxol/carboplatin 09/09/2021, 09/15/2021, 09/22/2021 Taxol/carboplatin 10/07/2021, 10/13/2021, 10/20/2021 Taxol/Carboplatin 11/03/2021, 11/10/2021, 11/17/2021 CT 11/23/2021-decrease in peritoneal metastases, no  new or progressive disease, airspace opacity at both lung bases Taxol/carboplatin 12/01/2021, 12/08/2021, 12/15/2021 Taxol/Carboplatin every 2 weeks beginning 12/29/2021 Carboplatin alone 01/26/2022, Taxol held due to neuropathy Carboplatin alone 02/09/2022, Taxol held due to neuropathy Carboplatin alone 02/23/2022, Taxol held due to neuropathy Carboplatin alone 03/09/2022, Taxol held due to neuropathy CTs 03/25/2022-no significant change in omental, mesenteric disease.  No new or clearly progressive findings.  Previously noted patchy areas of consolidative opacity at the lung bases have resolved. Treatment break beginning 03/26/2022 CT/pelvis 08/10/2022-increased size of peritoneal nodules, increased small volume ascites, mild right lower lobe pleural nodularity Cycle 1 salvage carboplatin 09/14/2022 (weekly x 3 followed by 1 week break) Cycle 2 salvage carboplatin 10/12/2022 (weekly x 3 followed by 1 week break) Cycle 3 salvage carboplatin 11/16/2022 (weekly x 3 followed by 1 week break) CT abdomen/pelvis 12/08/2022-mild increase in peritoneal carcinomatosis Tamoxifen 20 mg daily 01/07/2023   2. Low abdomen/suprapubic pain prior to the exploratory laparotomy-likely secondary to omental/pelvic tumor; persistent mild pain in the lower abdomen   3. Chronic neck and back pain.   4. Anxiety -persistent despite Lexapro and Xanax. She has been evaluated by psychiatry. Currently on Lexapro. 5. Status post Port-A-Cath placement 09/22/2012. The Port-A-Cath was removed on 04/03/2013.   6. Neutropenia secondary to chemotherapy- day 15 cycle 1 and cycle 3. Taxol/carboplatin not given secondary to neutropenia.    7. Herpes zoster involving a right thoracic dermatome July 2015. She completed a course of Valtrex. 8.  Nodular bony prominence at the left pelvis on rectal exam 06/05/2015-likely a benign finding 9.  Right ureter stone/hydroureteronephrosis on CT 01/13/2021-referred to urology; status post right ureteroscopy with stone extraction and ureteral stent placement.  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  Right hip fracture following a fall 138 2024-status post 12.  Acute localized thrombus right proximal femoral vein 11/16/2022 Eliquis initiated  Disposition: [name] [name] appears stable.  Recent CTs showed evidence of progression.  Estrogen receptor testing on the peritoneal biopsy from 2017 returned ER positive, 90%.  Foundation 1 results showed HRD was not detected.  Dr. Truett Perna discussed options to include a trial of tamoxifen versus Doxil.  We reviewed potential side effects associated with tamoxifen including hot flashes, increased risk of blood clot/stroke, uterine malignancy.  We reviewed potential side effects associated with Doxil including bone marrow toxicity, nausea, hair loss, rash, mouth sores, cardiomyopathy.  She would like to begin tamoxifen.  Prescription sent to her requested pharmacy.  We will obtain a baseline CA125 today.  She will return for a CA125 and follow-up visit in 4 weeks.  She will contact the office in the interim with any problems.  Patient seen with Dr. Truett Perna.  Lonna Cobb ANP/GNP-BC   01/07/2023  2:55 PM  This was a shared visit with Lonna Cobb.  We reviewed results of the pathology testing with [name] [name].  She does not appear to be a candidate for a PARP inhibitor.  We discussed treatment with antiestrogen therapy versus salvage chemotherapy.  We specifically discussed tamoxifen and Doxil.  We reviewed potential toxicities associated with these agents.  She understands the small chance of a clinical response with tamoxifen.  She is more comfortable proceeding with a trial of tamoxifen.  I was present for greater than 50% of today's visit.  I performed  medical decision making.  Mancel Bale, MD

## 2023-01-09 LAB — CA 125: Cancer Antigen (CA) 125: 63.8 U/mL — ABNORMAL HIGH (ref 0.0–38.1)

## 2023-01-10 ENCOUNTER — Other Ambulatory Visit: Payer: Self-pay | Admitting: *Deleted

## 2023-01-10 MED ORDER — APIXABAN 5 MG PO TABS: 5.0000 mg | ORAL_TABLET | Freq: Two times a day (BID) | ORAL | 1 refills | Status: DC

## 2023-01-10 NOTE — Progress Notes (Signed)
Patient requesting her Eliquis be sent to [facility].

## 2023-01-11 ENCOUNTER — Telehealth: Payer: Self-pay

## 2023-01-11 NOTE — Telephone Encounter (Signed)
[name] [name] has requested a 90-day supply of her Eliquis medication. The patient has indicated that ordering through [facility], via fax at [phone] or [phone], would be more cost-effective than using [facility].

## 2023-01-20 IMAGING — CT CT ABD-PELV W/ CM
2 of 5 series · 16 of 46 positions shown, 18 images · IV contrast (APPLIED)
Comparison: CT 01/13/2021

CLINICAL DATA: Ovarian cancer. High-grade serous carcinoma of the
ovary status post optimal debulking with rectosigmoid resection.
Total omentectomy. Hysterectomy and bilateral salpingo-oophorectomy
6266.

EXAM:
CT ABDOMEN AND PELVIS WITH CONTRAST
TECHNIQUE: Multidetector CT imaging of the abdomen and pelvis was performed
using the standard protocol following bolus administration of
intravenous contrast.
CONTRAST:  80mL OMNIPAQUE IOHEXOL 300 MG/ML  SOLN

[Series 2: abd pel w · axial · 0.67mm/px · z∈[+940,+1364]mm · 13 of 95 slices shown, 15 images]
[im 5/95  soft-tissue]
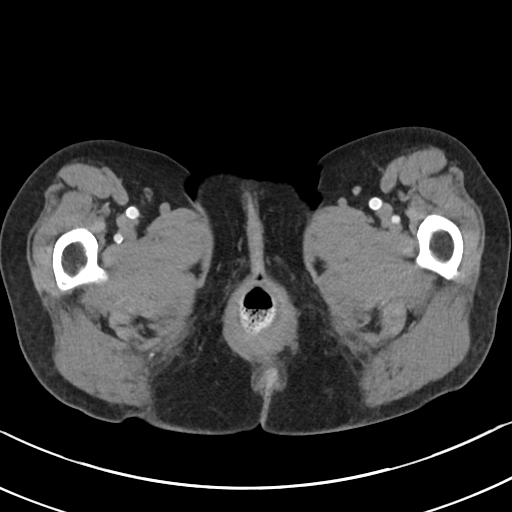
[im 5/95  bone]
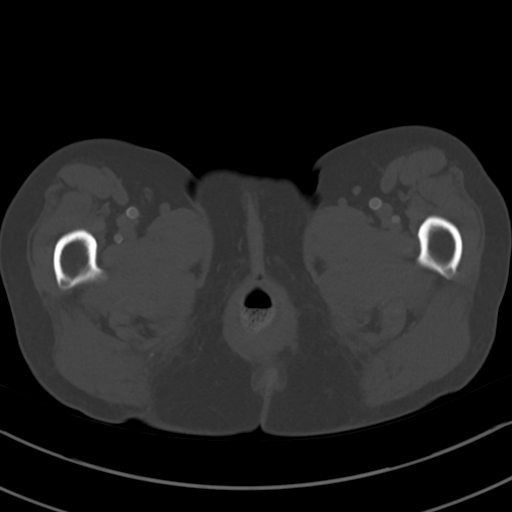
[im 15/95  soft-tissue]
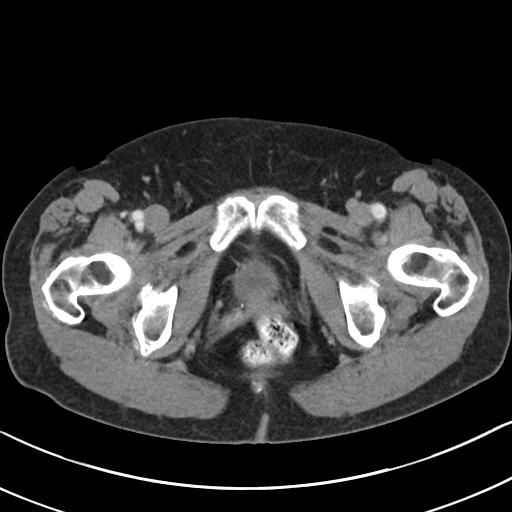
[im 20/95  soft-tissue]
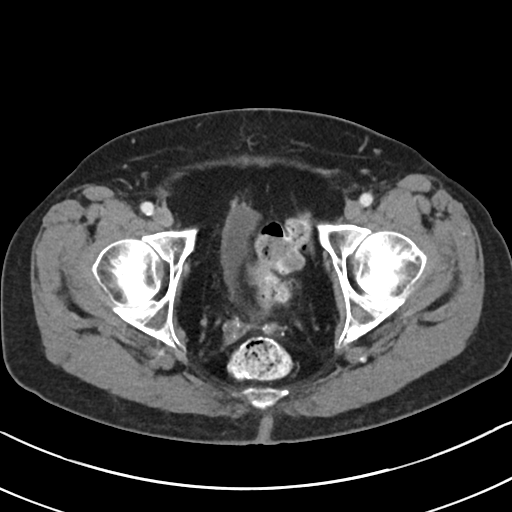
[im 25/95  soft-tissue]
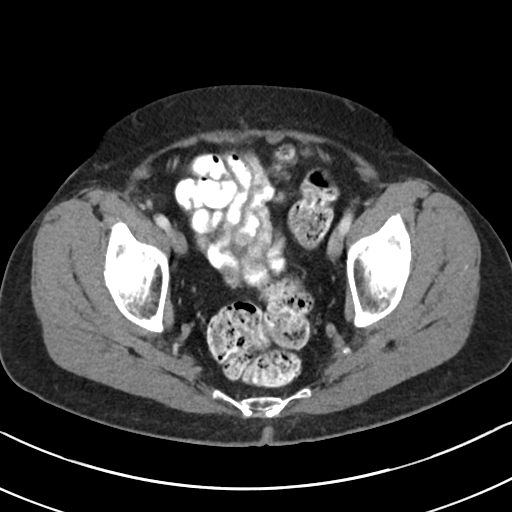
[im 35/95  soft-tissue]
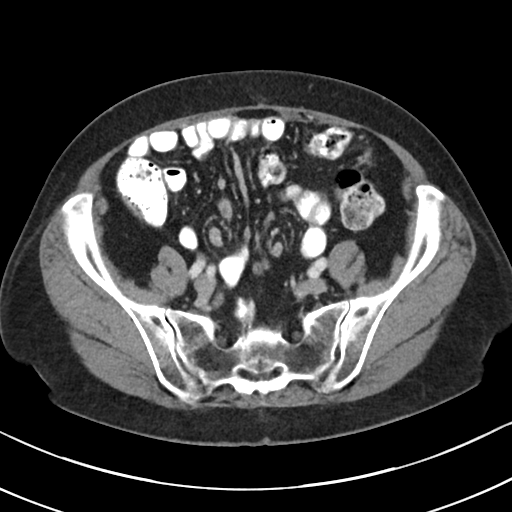
[im 40/95  soft-tissue]
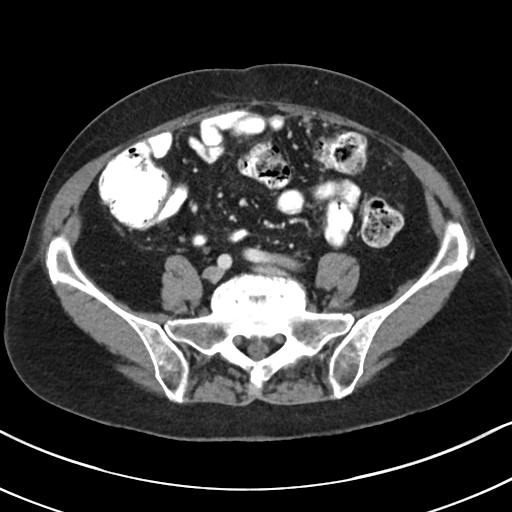
[im 50/95  soft-tissue]
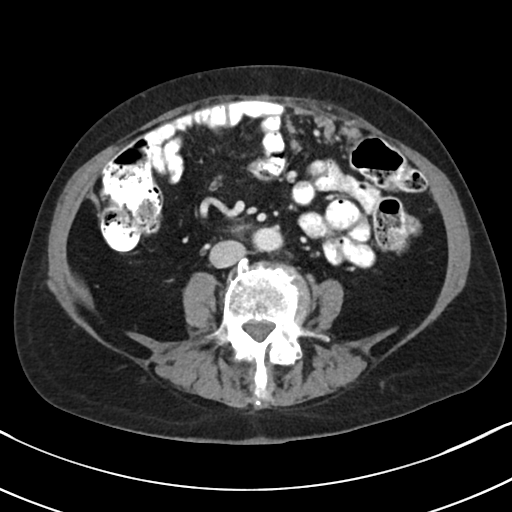
[im 55/95  soft-tissue]
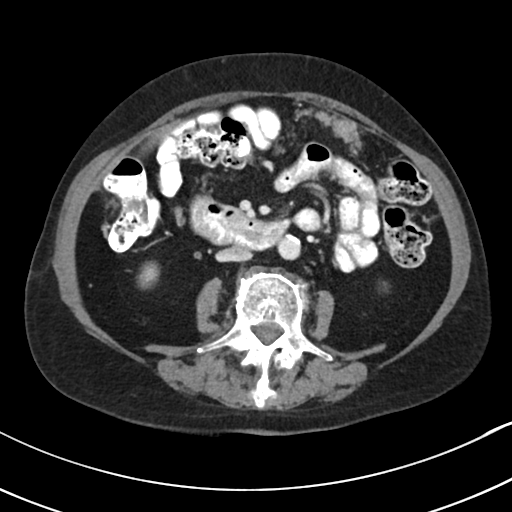
[im 60/95  soft-tissue]
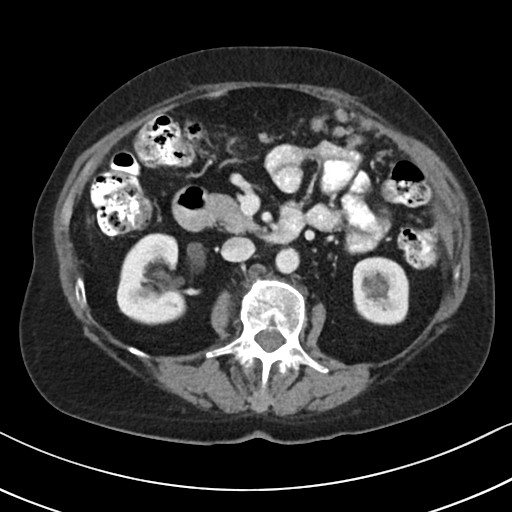
[im 60/95  bone]
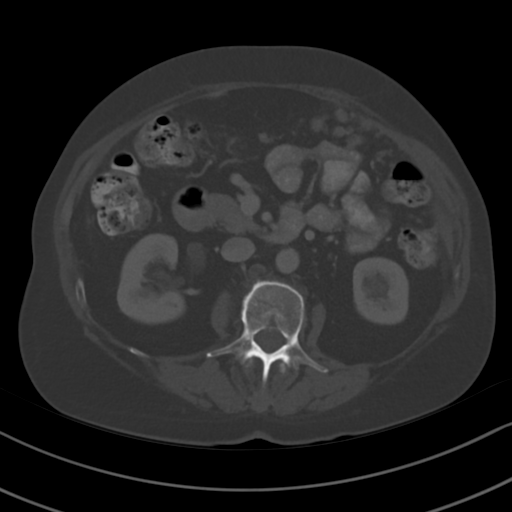
[im 70/95  soft-tissue]
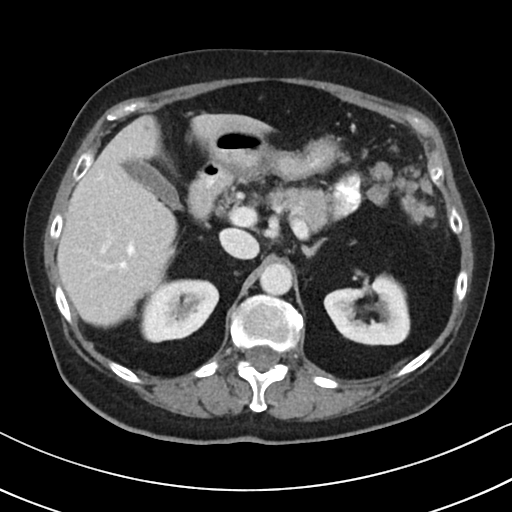
[im 75/95  soft-tissue]
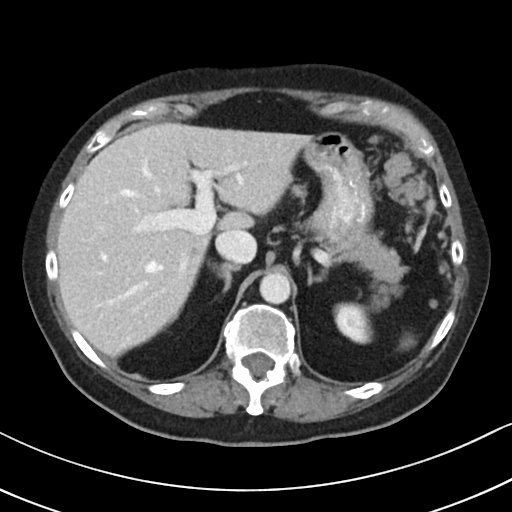
[im 80/95  soft-tissue]
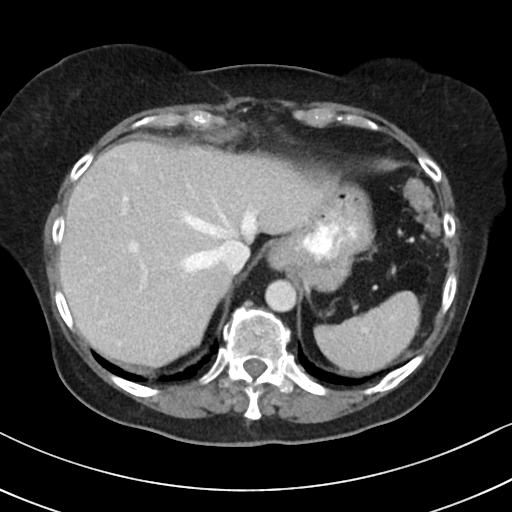
[im 90/95  soft-tissue]
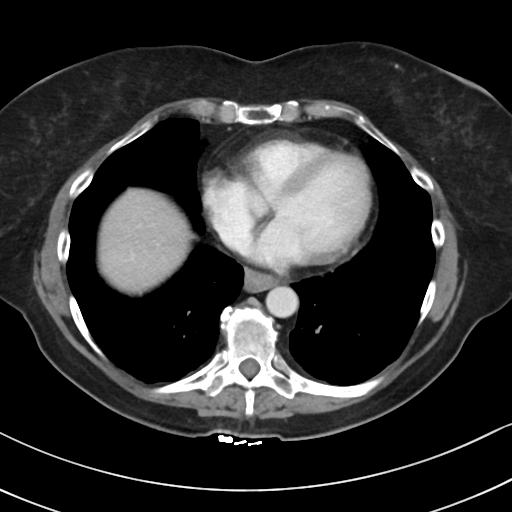

[Series 5: coronal · coronal · 0.72mm/px · 3 of 93 slices shown]
[im 31/93  soft-tissue]
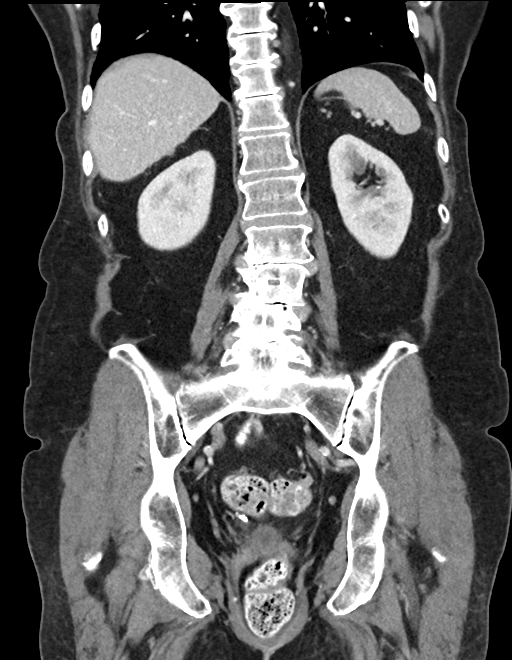
[im 41/93  soft-tissue]
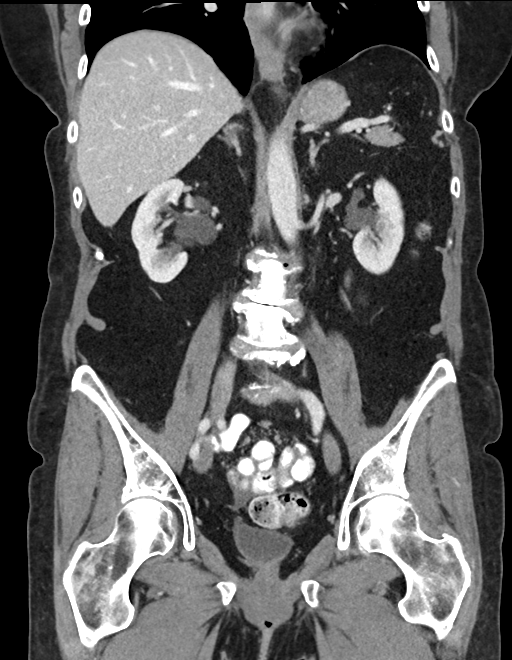
[im 52/93  soft-tissue]
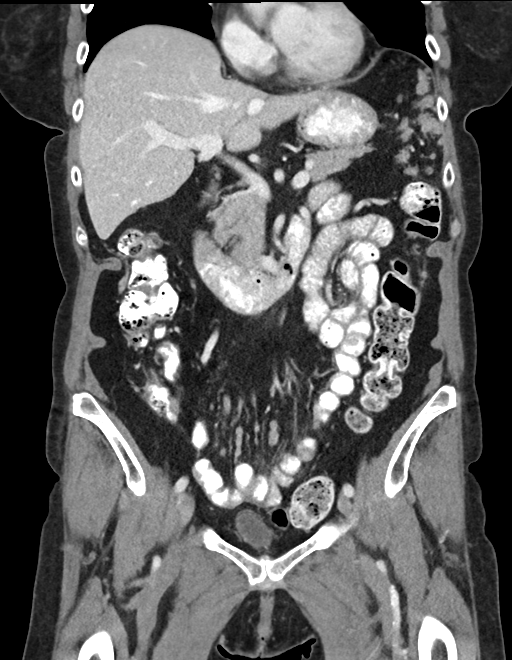

[16 of 46 positions shown; findings below may reference images not displayed]

FINDINGS: Lower chest: Lung bases are clear.

Hepatobiliary: No suspicious hepatic lesion.  Gallbladder normal.

Pancreas: Pancreas is normal. No ductal dilatation. No pancreatic
inflammation.

Spleen: Normal spleen

Adrenals/urinary tract: Adrenal glands and kidneys are normal. The
ureters and bladder normal.

Stomach/Bowel: Stomach, small bowel, appendix, and cecum are normal.
The colon and rectosigmoid colon are normal.

Vascular/Lymphatic: Abdominal aorta is normal caliber. No periportal
or retroperitoneal adenopathy. No pelvic adenopathy.

Reproductive: Post hysterectomy. Adnexa unremarkable no evidence of
local recurrence in the pelvis.

Other: Nodular omental thickening in the LEFT upper quadrant and
LEFT mid abdomen again noted. Example omental mass in the
gastrosplenic ligament measures 3.6 by 2.3 cm (image 20/series 2)
compared to 2.9 by 1.9 cm. Adjacent LEFT upper quadrant omental
nodule measuring 20 mm image [DATE] compares to 14 mm.

Within the ventral peritoneal space LEFT of midline beneath the
rectus muscle example nodule measures is 18 mm (image 39/series 2)
compared to 8 mm

Nodule in the gastrohepatic ligament measures 12 mm (image [DATE])
unchanged.

Nodular thickening along the peritoneal reflection in the RIGHT
iliac fossa measuring 11 mm (58/2) increased from 6 mm on prior

Musculoskeletal: No aggressive osseous lesion
IMPRESSION: 1. Mild measurable progression of omental and peritoneal nodule
metastasis predominantly in the LEFT upper quadrant and LEFT ventral
peritoneal space.
2. No evidence of local recurrence in the pelvis.
3. No liver metastasis.
4. No free fluid the abdomen pelvis.

## 2023-01-25 ENCOUNTER — Encounter (HOSPITAL_COMMUNITY): Payer: Self-pay | Admitting: Oncology

## 2023-01-26 DIAGNOSIS — M25551 Pain in right hip: Secondary | ICD-10-CM | POA: Diagnosis not present

## 2023-01-31 ENCOUNTER — Other Ambulatory Visit (HOSPITAL_BASED_OUTPATIENT_CLINIC_OR_DEPARTMENT_OTHER): Payer: Self-pay

## 2023-02-03 DIAGNOSIS — M25651 Stiffness of right hip, not elsewhere classified: Secondary | ICD-10-CM | POA: Diagnosis not present

## 2023-02-03 DIAGNOSIS — M25551 Pain in right hip: Secondary | ICD-10-CM | POA: Diagnosis not present

## 2023-02-08 ENCOUNTER — Other Ambulatory Visit: Payer: Medicare Other

## 2023-02-08 ENCOUNTER — Ambulatory Visit: Payer: Medicare Other | Admitting: Oncology

## 2023-02-10 ENCOUNTER — Inpatient Hospital Stay: Payer: Medicare Other | Attending: Oncology

## 2023-02-10 ENCOUNTER — Inpatient Hospital Stay: Payer: Medicare Other

## 2023-02-10 ENCOUNTER — Inpatient Hospital Stay: Payer: Medicare Other | Admitting: Oncology

## 2023-02-10 VITALS — BP 125/71 | HR 78 | Temp 98.1°F | Resp 18 | Ht 65.0 in | Wt 163.5 lb

## 2023-02-10 DIAGNOSIS — Z90722 Acquired absence of ovaries, bilateral: Secondary | ICD-10-CM | POA: Diagnosis not present

## 2023-02-10 DIAGNOSIS — R1031 Right lower quadrant pain: Secondary | ICD-10-CM | POA: Diagnosis not present

## 2023-02-10 DIAGNOSIS — Z79810 Long term (current) use of selective estrogen receptor modulators (SERMs): Secondary | ICD-10-CM | POA: Diagnosis not present

## 2023-02-10 DIAGNOSIS — C569 Malignant neoplasm of unspecified ovary: Secondary | ICD-10-CM | POA: Insufficient documentation

## 2023-02-10 DIAGNOSIS — R42 Dizziness and giddiness: Secondary | ICD-10-CM | POA: Insufficient documentation

## 2023-02-10 DIAGNOSIS — R232 Flushing: Secondary | ICD-10-CM | POA: Diagnosis not present

## 2023-02-10 DIAGNOSIS — Z923 Personal history of irradiation: Secondary | ICD-10-CM | POA: Insufficient documentation

## 2023-02-10 DIAGNOSIS — Z90710 Acquired absence of both cervix and uterus: Secondary | ICD-10-CM | POA: Insufficient documentation

## 2023-02-10 DIAGNOSIS — Z9221 Personal history of antineoplastic chemotherapy: Secondary | ICD-10-CM | POA: Insufficient documentation

## 2023-02-10 DIAGNOSIS — C786 Secondary malignant neoplasm of retroperitoneum and peritoneum: Secondary | ICD-10-CM | POA: Diagnosis not present

## 2023-02-10 DIAGNOSIS — Z8052 Family history of malignant neoplasm of bladder: Secondary | ICD-10-CM | POA: Diagnosis not present

## 2023-02-10 DIAGNOSIS — R5383 Other fatigue: Secondary | ICD-10-CM | POA: Insufficient documentation

## 2023-02-10 DIAGNOSIS — Z95828 Presence of other vascular implants and grafts: Secondary | ICD-10-CM

## 2023-02-10 DIAGNOSIS — R971 Elevated cancer antigen 125 [CA 125]: Secondary | ICD-10-CM | POA: Diagnosis not present

## 2023-02-10 MED ORDER — SODIUM CHLORIDE 0.9% FLUSH
10.0000 mL | INTRAVENOUS | Status: DC | PRN
  Administered 2023-02-10: 10 mL

## 2023-02-10 MED ORDER — HEPARIN SOD (PORK) LOCK FLUSH 100 UNIT/ML IV SOLN
500.0000 [IU] | Freq: Once | INTRAVENOUS | Status: AC | PRN
  Administered 2023-02-10: 500 [IU]

## 2023-02-10 NOTE — Progress Notes (Signed)
[facility] OFFICE PROGRESS NOTE   Diagnosis: Ovarian cancer  INTERVAL HISTORY:   [name] [name] began tamoxifen last month.  She reports intermittent hot flashes.  She has intermittent nausea.  No difficulty with bowel function.  She continues physical therapy for the right hip fracture.  Good appetite.  She continues apixaban anticoagulation.  No bleeding.  Objective:  Vital signs in last 24 hours:  Blood pressure 125/71, pulse 78, temperature 98.1 F (36.7 C), temperature source Oral, resp. rate 18, height 5\' 5"  (1.651 m), weight 163 lb 8 oz (74.2 kg), SpO2 96 %.   Resp: Lungs clear bilaterally Cardio: Regular rate and rhythm GI: No hepatosplenomegaly, no apparent ascites, no mass, nontender Vascular: No leg edema  Lab Results:  Lab Results  Component Value Date   WBC 5.1 12/14/2022   HGB 12.5 12/14/2022   HCT 38.7 12/14/2022   MCV 102.7 (H) 12/14/2022   PLT 279 12/14/2022   NEUTROABS 3.7 12/14/2022    CMP  Lab Results  Component Value Date   NA 139 12/14/2022   K 4.1 12/14/2022   CL 104 12/14/2022   CO2 26 12/14/2022   GLUCOSE 150 (H) 12/14/2022   BUN 20 12/14/2022   CREATININE 0.69 12/14/2022   CALCIUM 9.6 12/14/2022   PROT 7.0 12/14/2022   ALBUMIN 4.1 12/14/2022   AST 18 12/14/2022   ALT 25 12/14/2022   ALKPHOS 65 12/14/2022   BILITOT 0.5 12/14/2022   GFRNONAA >60 12/14/2022   GFRAA >60 03/13/2020    Lab Results  Component Value Date   CEA 0.8 07/13/2012   CA125 8 08/18/2016    Medications: I have reviewed the patient's current medications.   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 A 5 mm nodules remain on the right diaphragm. - TumorNext paired germline/tumor analyses: No somatic variants detected, germline CHEK2      VUS      Cycle 1 of adjuvant  Taxol/carboplatin chemotherapy initiated on 09/19/2012.   The CA 125 normalized.   She completed day 15 of cycle 6 on 02/06/2013.   Restaging CT evaluation 03/29/2013 showed no evidence of metastatic disease in the chest. There was marked improvement in appearance/resolution of previous described peritoneal/omental disease. There was no convincing evidence of residual disease. There was minimal increased density in the region of the omentum favored to be treatment-related. There was no pelvic adenopathy. CA125 3.2 on 02/19/2014. 08/18/2016 CA-125 8 CT abdomen/pelvis 08/25/2016-solitary new enlarged right external iliac lymph node measuring 2.2 cm. Biopsy 09/07/2016-adenocarcinoma consistent with high-grade serous carcinoma.  ER +90%, PR -0%; foundation 1-HRD not detected, microsatellite status cannot be determined, tumor mutation burden 8 PET scan 09/22/2016-malignant range FDG uptake associated with the enlarged right external iliac lymph node compatible with metastatic adenopathy.  No additional sites of metastatic disease identified. Radiation 10/25/2016-12/01/2016 PET scan 03/28/2017-previously enlarged hypermetabolic right external iliac node-normal in size with resolution of hypermetabolic activity, no active malignancy identified PET scan 05/15/2018-no evidence of recurrent or metastatic disease, no hypermetabolic lymph nodes CT abdomen/pelvis 09/19/2019-mild increase in the size of several left upper quadrant peritoneal nodules measuring up to 11 mm.  No other sites of metastatic disease identified.  No ascites. CT abdomen/pelvis 03/14/2020-slight enlargement of several left upper quadrant peritoneal nodules, no ascites, no other evidence of disease progression CT abdomen/pelvis 09/12/2020 peritoneal nodularity/omental caking predominantly in the left upper/mid abdomen, mildly progressive.  Largest  implant in the left upper abdomen adjacent to the stomach now measures 17 mm, previously 13 mm.   Overall volume of peritoneal disease has mildly progressed. CT abdomen/pelvis 01/13/2021- 4 mm right ureteral calculus with moderate right hydronephrosis and upper right hydroureter, progressive omental nodularity, stable calcified and partially calcified right pelvic nodules CT abdomen/pelvis 05/13/2021-enlargement of left upper quadrant omental nodules and a peritoneal nodule at the right iliac fossa, no ascites CT abdomen/pelvis 08/14/2021-mild increase in size of omental nodules, no ascites, no new site of metastatic disease Taxol/carboplatin 09/09/2021, 09/15/2021, 09/22/2021 Taxol/carboplatin 10/07/2021, 10/13/2021, 10/20/2021 Taxol/Carboplatin 11/03/2021, 11/10/2021, 11/17/2021 CT 11/23/2021-decrease in peritoneal metastases, no new or progressive disease, airspace opacity at both lung bases Taxol/carboplatin 12/01/2021, 12/08/2021, 12/15/2021 Taxol/Carboplatin every 2 weeks beginning 12/29/2021 Carboplatin alone 01/26/2022, Taxol held due to neuropathy Carboplatin alone 02/09/2022, Taxol held due to neuropathy Carboplatin alone 02/23/2022, Taxol held due to neuropathy Carboplatin alone 03/09/2022, Taxol held due to neuropathy CTs 03/25/2022-no significant change in omental, mesenteric disease.  No new or clearly progressive findings.  Previously noted patchy areas of consolidative opacity at the lung bases have resolved. Treatment break beginning 03/26/2022 CT/pelvis 08/10/2022-increased size of peritoneal nodules, increased small volume ascites, mild right lower lobe pleural nodularity Cycle 1 salvage carboplatin 09/14/2022 (weekly x 3 followed by 1 week break) Cycle 2 salvage carboplatin 10/12/2022 (weekly x 3 followed by 1 week break) Cycle 3 salvage carboplatin 11/16/2022 (weekly x 3 followed by 1 week break) CT abdomen/pelvis 12/08/2022-mild increase in peritoneal carcinomatosis Tamoxifen 20 mg daily 01/07/2023   2. Low abdomen/suprapubic pain prior to the exploratory laparotomy-likely secondary to omental/pelvic  tumor; persistent mild pain in the lower abdomen   3. Chronic neck and back pain.   4. Anxiety -persistent despite Lexapro and Xanax. She has been evaluated by psychiatry. Currently on Lexapro. 5. Status post Port-A-Cath placement 09/22/2012. The Port-A-Cath was removed on 04/03/2013.   6. Neutropenia secondary to chemotherapy- day 15 cycle 1 and cycle 3. Taxol/carboplatin not given secondary to neutropenia.    7. Herpes zoster involving a right thoracic dermatome July 2015. She completed a course of Valtrex. 8. Nodular bony prominence at the left pelvis on rectal exam 06/05/2015-likely a benign finding 9.  Right ureter stone/hydroureteronephrosis on CT 01/13/2021-referred to urology; status post right ureteroscopy with stone extraction and ureteral stent placement.  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  Right hip fracture following a fall 11/02/2022-status post right hip anterior hemiarthroplasty 12.  Acute localized thrombus right proximal femoral vein 11/16/2022 Eliquis initiated   Disposition: Ms. Gohde appears stable.  She is tolerating the tamoxifen well aside from mild hot flashes.  There is no clinical evidence of progressive ovarian cancer.  She will continue tamoxifen.  We will follow-up on the CA125 from today.  Ms. Morine will return for an office and lab visit in 4-5 weeks.  Thornton Papas, MD  02/10/2023  8:43 AM

## 2023-02-12 LAB — CA 125: Cancer Antigen (CA) 125: 611 U/mL — ABNORMAL HIGH (ref 0.0–38.1)

## 2023-02-14 DIAGNOSIS — M25551 Pain in right hip: Secondary | ICD-10-CM | POA: Diagnosis not present

## 2023-02-15 ENCOUNTER — Telehealth: Payer: Self-pay | Admitting: Nurse Practitioner

## 2023-02-15 ENCOUNTER — Telehealth: Payer: Self-pay

## 2023-02-15 DIAGNOSIS — C786 Secondary malignant neoplasm of retroperitoneum and peritoneum: Secondary | ICD-10-CM | POA: Diagnosis not present

## 2023-02-15 NOTE — Telephone Encounter (Signed)
The patient contacted our office and requested to speak with the provider regarding her CEA report.

## 2023-02-15 NOTE — Telephone Encounter (Signed)
I returned [name] [name]'s call regarding the Ca125 result from 02/10/2023.  The value is higher.  If the CA125 is higher at the next appointment plan for CT scans and consider changing treatment to Doxil.  She is in agreement.

## 2023-02-23 ENCOUNTER — Encounter: Payer: Self-pay | Admitting: Gynecologic Oncology

## 2023-02-23 DIAGNOSIS — M25651 Stiffness of right hip, not elsewhere classified: Secondary | ICD-10-CM | POA: Diagnosis not present

## 2023-02-23 DIAGNOSIS — M25551 Pain in right hip: Secondary | ICD-10-CM | POA: Diagnosis not present

## 2023-02-25 ENCOUNTER — Encounter: Payer: Self-pay | Admitting: Gynecologic Oncology

## 2023-02-25 ENCOUNTER — Inpatient Hospital Stay (HOSPITAL_BASED_OUTPATIENT_CLINIC_OR_DEPARTMENT_OTHER): Payer: Medicare Other | Admitting: Gynecologic Oncology

## 2023-02-25 VITALS — BP 124/59 | HR 94 | Temp 98.7°F | Resp 16 | Ht 63.78 in | Wt 163.8 lb

## 2023-02-25 DIAGNOSIS — R42 Dizziness and giddiness: Secondary | ICD-10-CM | POA: Diagnosis not present

## 2023-02-25 DIAGNOSIS — Z90710 Acquired absence of both cervix and uterus: Secondary | ICD-10-CM

## 2023-02-25 DIAGNOSIS — Z8052 Family history of malignant neoplasm of bladder: Secondary | ICD-10-CM

## 2023-02-25 DIAGNOSIS — C786 Secondary malignant neoplasm of retroperitoneum and peritoneum: Secondary | ICD-10-CM

## 2023-02-25 DIAGNOSIS — C569 Malignant neoplasm of unspecified ovary: Secondary | ICD-10-CM

## 2023-02-25 DIAGNOSIS — R971 Elevated cancer antigen 125 [CA 125]: Secondary | ICD-10-CM

## 2023-02-25 DIAGNOSIS — Z79810 Long term (current) use of selective estrogen receptor modulators (SERMs): Secondary | ICD-10-CM | POA: Diagnosis not present

## 2023-02-25 DIAGNOSIS — R5383 Other fatigue: Secondary | ICD-10-CM | POA: Diagnosis not present

## 2023-02-25 DIAGNOSIS — Z9221 Personal history of antineoplastic chemotherapy: Secondary | ICD-10-CM

## 2023-02-25 DIAGNOSIS — Z923 Personal history of irradiation: Secondary | ICD-10-CM | POA: Diagnosis not present

## 2023-02-25 DIAGNOSIS — Z90722 Acquired absence of ovaries, bilateral: Secondary | ICD-10-CM

## 2023-02-25 DIAGNOSIS — R232 Flushing: Secondary | ICD-10-CM | POA: Diagnosis not present

## 2023-02-25 DIAGNOSIS — R1031 Right lower quadrant pain: Secondary | ICD-10-CM

## 2023-02-25 NOTE — Patient Instructions (Addendum)
It was good to see you today.    I will see you for follow-up in 4 months.  I will send my note to Dr. Truett Perna, but I agree with changing treatments given increasing CA-125.  As always, if you develop any new and concerning symptoms before your next visit, please call to see me sooner.

## 2023-02-25 NOTE — Progress Notes (Unsigned)
Gynecologic Oncology Return Clinic Visit  02/25/23  Reason for Visit: surveillance  Treatment History: Oncology History Overview Note  CA-125 10/10/113: 69 02/2013: 5.6, post adjuvant chemotherapy (03/2013-02/2016): 3.2-8.8 08/2016: 10.2, just prior to recurrence diagnosis 02/14/17: 7.1, 3 months post completion of RT 09/17/19: 6.5  In 2014, presented with pelvic pain and pressure, urinary frequency.  In 08/2016, presented with 3 months of constipation and RLQ pain. On exam, had some fullness and tenderness along right pelvic sidewall.    Ovarian cancer (HCC)  07/12/2012 Imaging   PET: significant soft tissue thickening seen throughout the greater omentum suspicious for intraperitoneal carcinoma. There is minimal ascites in the pelvis. it revealed the areas of omental and peritoneal nodularity were hypermetabolic. There is a dominant pelvic omental mass measuring 10.4 x 3.3 cm. There is a small omental implants in the left side of the abdomen measuring 1.1 cm. There were multiple pelvic foci That wereperitoneal based And hypermetabolic. These included the cul-de-sac. She has small volume of ascites.    07/24/2012 Initial Biopsy   CT-guided revealed metastatic carcinoma. By [facility], malignant cells were positive for WT 1, cytokeratin 7, estrogen receptor. There were negative for TTF-1, progesterone receptor, cytokeratin 20, CEA, CD X2, and S100. Overall, the histologic features were highly suggestive of a primary gynecologic origin.   08/22/2012 Surgery   Radical debulking - TAH/BSO, ureterolysis, omentectomy, stripping of pelvic peritoneum, omentectomy, and rectosigmoid resection with low rectal reanastomosis Findings: stage IIIC ovarian cancer. Patient was optimally debulked. Debulking, however, required a rectosigmoid resection as well as total omentectomy total nominal hysterectomy bilateral salpingo-oophorectomy. She was optimally debulked with only 5 mm nodules on the right diaphragm as  residual disease.    08/23/2012 Initial Diagnosis   Ovarian cancer (HCC)   09/2012 - 02/06/2013 Chemotherapy   6 cycles of carboplatin/taxol    03/29/2013 Imaging   CT C/A/P: 1.  Marked response to therapy of omental/peritoneal disease.  No residual measurable disease identified. 2.  Interval hysterectomy and bilateral oophrectomy.   08/25/2016 Imaging   CT A/P: 1. Solitary new enlarged right external iliac lymph node, suspicious for metastatic nodal recurrence. 2. No additional sites of metastatic disease in the abdomen or pelvis. 3. Aortic atherosclerosis.   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 There is malignant range FDG uptake associated with the enlarged right external iliac lymph node compatible with metastatic adenopathy. 2. No additional sites of metastatic disease identified. 3. Aortic atherosclerosis.   10/25/2016 - 12/01/2016 Radiation Therapy   Radiation to right pelvis treated to 60.2Gy in 28 fractions   03/28/2017 Imaging   PET post RT: 1. The previously enlarged and hypermetabolic right external iliac lymph node is currently of normal size and the previous hypermetabolic activity has resolved. No current active malignancy identified. 2. 2 mm right mid kidney nonobstructive renal calculus. 3.  Aortic Atherosclerosis (ICD10-I70.0).   09/23/2017 Imaging   CT A/P: No evidence of bowel obstruction.  Normal appendix. No CT findings to account for the patient's worsening right lower quadrant abdominal pain. No evidence of recurrent or metastatic disease.   10/31/2017 Imaging   PET: No evidence local ovarian carcinoma recurrence in the pelvis. No metastatic adenopathy or distant metastatic disease.   05/15/2018 Imaging   PET: 1. No evidence recurrent or metastatic disease. 2.  Aortic  atherosclerosis (ICD10-170.0).   09/19/2019  Imaging   CT A/P: Mild increase in size of several left upper quadrant peritoneal nodules measuring up to 11 mm, highly suspicious for increased peritoneal carcinomatosis. No other sites of metastatic disease identified. No evidence of ascites.   11/27/2019 Genetic Testing   CHEK2 p.N405K VUS found on the cancerNext HRD testing.  The CancerNext gene panel offered by [employer] includes sequencing and rearrangement analysis for the following 34 genes:   APC, ATM, BARD1, BMPR1A, BRCA1, BRCA2, BRIP1, CDH1, CDK4, CDKN2A, CHEK2, DICER1, HOXB13, EPCAM, GREM1, MLH1, MRE11A, MSH2, MSH6, MUTYH, NBN, NF1, PALB2, PMS2, POLD1, POLE, PTEN, RAD50, RAD51C, RAD51D, SMAD4, SMARCA4, STK11, and TP53.  The report date is November 27, 2019.  HRD testing did not identify any pathogenic variants in the tumor.  Therefore HRD is neg.   09/09/2021 - 03/09/2022 Chemotherapy   Patient is on Treatment Plan : OVARIAN Paclitaxel / Carboplatin q7d     09/14/2022 - 11/30/2022 Chemotherapy   Patient is on Treatment Plan : BREAST Paclitaxel + Carboplatin q7d      C/T 09/09/21-12/2021, then carboplatin along 01/2022-03/2022 CT 03/2022: stable disease, no new or clearly progressive disease Treatment break CT 08/2022: increased size of peritoneal nodules, increased small volume ascites, mild RLL pleural nodularity Treatment with single agent carboplatin (3 cycles: 09/2022-11/2022) CT A/P 12/2022: mild increase in peritoneal carcinomatosis Started tamoxifen 01/07/2023  [facility] genetics: VUS in CHEK2 [employer]: FOLR1 + (80%) Foundation One: HRP, LOS 10.5%, MS unable to be determined, p53 abnormal  Interval History: Patient reports overall doing well.  She broke her hip in January, has been working with physical therapy.  Is recovering well.  She endorses some increased fatigue as well as intermittent dizziness.  Still has some intermittent right lower quadrant pain but now is unsure if  this is related to her cancer or her recent hip surgery.  Bowel function is finally normalized after multiple months of struggling with constipation.  Past Medical/Surgical History: Past Medical History:  Diagnosis Date   Anxiety and depression     DJD (degenerative joint disease)    Elevated cholesterol    Endometrial polyp    Family history of bladder cancer    Family history of lymphoma    History of radiation therapy 10/25/16-12/01/16   right pelvis/60.2 Gy in 28 fractions   IRRITABLE BOWEL SYNDROME, HX OF 05/15/2008   Kidney stone    MVP (mitral valve prolapse)    occ palpitations    Ovarian ca (HCC) 07/2012       Rheumatoid arthritis(714.0) ~ 2010   dr Dareen Piano   Shingles     Past Surgical History:  Procedure Laterality Date   ABDOMINAL HYSTERECTOMY  08/22/2012   Procedure: HYSTERECTOMY ABDOMINAL;  Surgeon: Jeannette Corpus, MD;  Location: [facility];  Service: Gynecology;  Laterality: N/A;   ANTERIOR APPROACH HEMI HIP ARTHROPLASTY Right 11/03/2022   Procedure: ANTERIOR APPROACH HEMI HIP ARTHROPLASTY;  Surgeon: Lyndle Herrlich, MD;  Location: [facility];  Service: Orthopedics;  Laterality: Right;   BACK SURGERY  10/04/2006   Dr Dutch Quint   COLONOSCOPY  04/20/2004   COLONOSCOPY W/ BIOPSIES  07/04/2014   COLOSTOMY REVISION  08/22/2012   Procedure: COLON RESECTION SIGMOID;  Surgeon: Jeannette Corpus, MD;  Location: [facility];  Service: Gynecology;;  Rectal Sigmoid resection and low rectal anastomosis   CYST ON NECK  10/04/2009   DEBULKING  08/22/2012   Procedure: DEBULKING;  Surgeon: Jeannette Corpus, MD;  Location: [facility];  Service: Gynecology;  Laterality: N/A;  Radical tumor debulking, Bilateral Ureterolysis   DILATION AND CURETTAGE OF UTERUS  10/04/2002   WITH HYSTEROSCOPY   HAND SURGERY  10/04/1994   HYSTEROSCOPY  10/04/2002   D&C   IR IMAGING GUIDED PORT INSERTION  09/01/2021   KIDNEY STONE SURGERY Right    with stent placement  Mar 03 2021   NECK  SURGERY  10/05/2007   SPURS   OMENTECTOMY  08/22/2012   Procedure: OMENTECTOMY;  Surgeon: Jeannette Corpus, MD;  Location: [facility];  Service: Gynecology;  Laterality: N/A;   SALPINGOOPHORECTOMY  08/22/2012   Procedure: SALPINGO OOPHORECTOMY;  Surgeon: Jeannette Corpus, MD;  Location: [facility];  Service: Gynecology;  Laterality: Bilateral;   TUBAL LIGATION  10/04/1978    Family History  Problem Relation Age of Onset   Heart disease Mother    Bladder Cancer Father 82   Osteoporosis Sister    Lymphoma Paternal Aunt    Colon cancer Neg Hx    Rectal cancer Neg Hx    Stomach cancer Neg Hx    Diabetes Neg Hx    Stroke Neg Hx    Colon polyps Neg Hx     Social History   Socioeconomic History   Marital status: Married    Spouse name: Not on file   Number of children: 2   Years of education: Not on file   Highest education level: Not on file  Occupational History   Occupation: disability d/t RA  Tobacco Use   Smoking status: Never   Smokeless tobacco: Never  Vaping Use   Vaping Use: Never used  Substance and Sexual Activity   Alcohol use: No   Drug use: No   Sexual activity: Never    Birth control/protection: Post-menopausal, Surgical  Other Topics Concern   Not on file  Social History Narrative   Florence Canner is her daughter    Household-- pr and husband   Social Determinants of Health   Financial Resource Strain: Low Risk  (11/17/2021)   Overall Financial Resource Strain (CARDIA)    Difficulty of Paying Living Expenses: Not hard at all  Food Insecurity: No Food Insecurity (11/08/2022)   Hunger Vital Sign    Worried About Running Out of Food in the Last Year: Never true    Ran Out of Food in the Last Year: Never true  Transportation Needs: No Transportation Needs (11/02/2022)   PRAPARE - Administrator, Civil Service (Medical): No    Lack of Transportation (Non-Medical): No  Physical Activity: Insufficiently Active (11/17/2021)   Exercise  Vital Sign    Days of Exercise per Week: 1 day    Minutes of Exercise per Session: 30 min  Stress: Stress Concern Present (11/17/2021)   [employer] of Occupational Health - Occupational Stress Questionnaire    Feeling of Stress : To some extent  Social Connections: Moderately Isolated (11/17/2021)   Social Connection and Isolation Panel [NHANES]    Frequency of Communication with Friends and Family: Once a week    Frequency of Social Gatherings with Friends and Family: Once a week    Attends Religious Services: Never    Database administrator or Organizations: Yes    Attends Banker Meetings: Never    Marital Status: Married    Current Medications:  Current Outpatient Medications:    apixaban (ELIQUIS) 5 MG TABS tablet, Take 1 tablet (5 mg total) by mouth 2 (two) times daily., Disp: 180 tablet, Rfl: 1   docusate  sodium (COLACE) 100 MG capsule, Take 1 capsule (100 mg total) by mouth 2 (two) times daily., Disp: 30 capsule, Rfl: 0   fish oil-omega-3 fatty acids 1000 MG capsule, Take 1 g by mouth daily., Disp: , Rfl:    HYDROcodone-acetaminophen (NORCO) 7.5-325 MG tablet, Take 1 tablet by mouth every 4 (four) hours as needed for severe pain (pain score 7-10)., Disp: 30 tablet, Rfl: 0   Multiple Vitamin (MULTIVITAMIN WITH MINERALS) TABS, Take 1 tablet by mouth daily., Disp: , Rfl:    omega-3 acid ethyl esters (LOVAZA) 1 g capsule, Take by mouth., Disp: , Rfl:    prochlorperazine (COMPAZINE) 10 MG tablet, Take 10 mg by mouth every 6 (six) hours as needed for nausea or vomiting., Disp: , Rfl:    Red Yeast Rice Extract 600 MG CAPS, Take 1 capsule by mouth daily., Disp: , Rfl:    tamoxifen (NOLVADEX) 20 MG tablet, Take 1 tablet (20 mg total) by mouth daily., Disp: 30 tablet, Rfl: 3  Current Facility-Administered Medications:    0.9 %  sodium chloride infusion, 500 mL, Intravenous, Continuous, Meryl Dare, MD  Review of Systems: + Ringing in the ears, fatigue, hot  flashes, joint pain, itch, dizziness, numbness Denies appetite changes, fevers, chills, unexplained weight changes. Denies hearing loss, neck lumps or masses, mouth sores or voice changes. Denies cough or wheezing.  Denies shortness of breath. Denies chest pain or palpitations. Denies leg swelling. Denies abdominal distention, pain, blood in stools, constipation, diarrhea, nausea, vomiting, or early satiety. Denies pain with intercourse, dysuria, frequency, hematuria or incontinence. Denies pelvic pain, vaginal bleeding or vaginal discharge.   Denies back pain or muscle pain/cramps. Denies rash, or wounds. Denies headaches or seizures. Denies swollen lymph nodes or glands, denies easy bruising or bleeding. Denies anxiety, depression, confusion, or decreased concentration.  Physical Exam: BP (!) 124/59 (BP Location: Right Arm, Patient Position: Sitting)   Pulse 94   Temp 98.7 F (37.1 C) (Oral)   Resp 16   Ht 5' 3.78" (1.62 m)   Wt 163 lb 12.8 oz (74.3 kg)   SpO2 96%   BMI 28.31 kg/m  General: Alert, oriented, no acute distress. HEENT: Atraumatic, normocephalic, sclera anicteric. Chest: Unlabored breathing on room air.  Laboratory & Radiologic Studies:          Component Ref Range & Units 2 wk ago 1 mo ago 2 mo ago 3 mo ago 4 mo ago 5 mo ago 6 mo ago  Cancer Antigen (CA) 125 0.0 - 38.1 U/mL 611.0 High  63.8 High  CM 44.3 High  CM 50.2 High  CM 70.8 High  CM 76.9 High  CM 41.2 High  CM     12/08/22: CT A/P IMPRESSION: Mild increase in peritoneal carcinomatosis since prior study.  Assessment & Plan: [name] [name] is a 72 y.o. woman with recurrent platinum sensitive ovarian cancer.  Had partial response during last treatment, this was stopped in June for treatment break.  Now with increasing CA125 and progression of disease on imaging.   ** mirv (75%) Doxil bev   Patient is overall doing well.  She remains relatively asymptomatic with regard to her cancer, which has had a  very indolent course.  We discussed trying a bulking agent to see if this helps with her diarrhea.  Given her urinary symptoms, we will send a urinalysis and culture today.   Plan is for her to start single agent carboplatin in the setting of her platinum sensitive disease.  She and I talked about the possibility of clinical trials.  At this time, she is wanting to pursue single agent chemotherapy but is open to talking more about clinical trials.  I will call her on Monday to discuss this further.  We have 2 trials for platinum sensitive ovarian cancer patients open at [facility].  If the patient is interested, I can send her information to our study coordinators to screen her for eligibility.  *** minutes of total time was spent for this patient encounter, including preparation, face-to-face counseling with the patient and coordination of care, and documentation of the encounter.  Eugene Garnet, MD  Division of Gynecologic Oncology  Department of Obstetrics and Gynecology  [facility]

## 2023-03-02 DIAGNOSIS — M25651 Stiffness of right hip, not elsewhere classified: Secondary | ICD-10-CM | POA: Diagnosis not present

## 2023-03-02 DIAGNOSIS — M25551 Pain in right hip: Secondary | ICD-10-CM | POA: Diagnosis not present

## 2023-03-03 ENCOUNTER — Encounter: Payer: Self-pay | Admitting: Oncology

## 2023-03-15 ENCOUNTER — Inpatient Hospital Stay: Payer: Medicare Other | Admitting: Oncology

## 2023-03-15 ENCOUNTER — Inpatient Hospital Stay: Payer: Medicare Other

## 2023-03-15 ENCOUNTER — Inpatient Hospital Stay: Payer: Medicare Other | Attending: Oncology

## 2023-03-15 ENCOUNTER — Other Ambulatory Visit (HOSPITAL_BASED_OUTPATIENT_CLINIC_OR_DEPARTMENT_OTHER): Payer: Self-pay

## 2023-03-15 VITALS — BP 133/70 | HR 86 | Temp 98.1°F | Resp 18 | Ht 63.0 in | Wt 165.0 lb

## 2023-03-15 DIAGNOSIS — C786 Secondary malignant neoplasm of retroperitoneum and peritoneum: Secondary | ICD-10-CM | POA: Diagnosis not present

## 2023-03-15 DIAGNOSIS — R188 Other ascites: Secondary | ICD-10-CM | POA: Insufficient documentation

## 2023-03-15 DIAGNOSIS — Z95828 Presence of other vascular implants and grafts: Secondary | ICD-10-CM | POA: Diagnosis not present

## 2023-03-15 DIAGNOSIS — G620 Drug-induced polyneuropathy: Secondary | ICD-10-CM | POA: Insufficient documentation

## 2023-03-15 DIAGNOSIS — C569 Malignant neoplasm of unspecified ovary: Secondary | ICD-10-CM | POA: Diagnosis not present

## 2023-03-15 DIAGNOSIS — R918 Other nonspecific abnormal finding of lung field: Secondary | ICD-10-CM | POA: Diagnosis not present

## 2023-03-15 DIAGNOSIS — D701 Agranulocytosis secondary to cancer chemotherapy: Secondary | ICD-10-CM | POA: Diagnosis not present

## 2023-03-15 DIAGNOSIS — Z79810 Long term (current) use of selective estrogen receptor modulators (SERMs): Secondary | ICD-10-CM | POA: Insufficient documentation

## 2023-03-15 DIAGNOSIS — M25472 Effusion, left ankle: Secondary | ICD-10-CM | POA: Diagnosis not present

## 2023-03-15 DIAGNOSIS — Z452 Encounter for adjustment and management of vascular access device: Secondary | ICD-10-CM | POA: Diagnosis not present

## 2023-03-15 DIAGNOSIS — M25471 Effusion, right ankle: Secondary | ICD-10-CM | POA: Diagnosis not present

## 2023-03-15 LAB — CMP (CANCER CENTER ONLY)
ALT: 20 U/L (ref 0–44)
AST: 21 U/L (ref 15–41)
Albumin: 3.9 g/dL (ref 3.5–5.0)
Alkaline Phosphatase: 37 U/L — ABNORMAL LOW (ref 38–126)
Anion gap: 7 (ref 5–15)
BUN: 21 mg/dL (ref 8–23)
CO2: 27 mmol/L (ref 22–32)
Calcium: 8.8 mg/dL — ABNORMAL LOW (ref 8.9–10.3)
Chloride: 105 mmol/L (ref 98–111)
Creatinine: 0.66 mg/dL (ref 0.44–1.00)
GFR, Estimated: >60 mL/min
Glucose, Bld: 86 mg/dL (ref 70–99)
Potassium: 3.8 mmol/L (ref 3.5–5.1)
Sodium: 139 mmol/L (ref 135–145)
Total Bilirubin: 0.5 mg/dL (ref 0.3–1.2)
Total Protein: 6.3 g/dL — ABNORMAL LOW (ref 6.5–8.1)

## 2023-03-15 MED ORDER — SODIUM CHLORIDE 0.9% FLUSH
10.0000 mL | INTRAVENOUS | Status: DC | PRN
  Administered 2023-03-15: 10 mL

## 2023-03-15 MED ORDER — HEPARIN SOD (PORK) LOCK FLUSH 100 UNIT/ML IV SOLN
500.0000 [IU] | Freq: Once | INTRAVENOUS | Status: AC | PRN
  Administered 2023-03-15: 500 [IU]

## 2023-03-15 NOTE — Patient Instructions (Signed)

## 2023-03-15 NOTE — Progress Notes (Signed)
[facility] OFFICE PROGRESS NOTE   Diagnosis: Ovarian cancer  INTERVAL HISTORY:   [name] [name] returns as scheduled.  She continues tamoxifen.  No hot flashes at present.  She reports bilateral ankle swelling.  No difficulty with bowel function.  Good appetite.  Objective:  Vital signs in last 24 hours:  Blood pressure 133/70, pulse 86, temperature 98.1 F (36.7 C), temperature source Oral, resp. rate 18, height 5\' 3"  (1.6 m), weight 165 lb (74.8 kg), SpO2 98 %.   Resp: Lungs clear bilaterally Cardio: Regular rate and rhythm GI: No hepatosplenomegaly, nontender, no mass, no apparent ascites Vascular: Trace pitting edema at the ankle bilaterally    Portacath/PICC-without erythema  Lab Results:  Lab Results  Component Value Date   WBC 5.1 12/14/2022   HGB 12.5 12/14/2022   HCT 38.7 12/14/2022   MCV 102.7 (H) 12/14/2022   PLT 279 12/14/2022   NEUTROABS 3.7 12/14/2022    CMP  Lab Results  Component Value Date   NA 139 03/15/2023   K 3.8 03/15/2023   CL 105 03/15/2023   CO2 27 03/15/2023   GLUCOSE 86 03/15/2023   BUN 21 03/15/2023   CREATININE 0.66 03/15/2023   CALCIUM 8.8 (L) 03/15/2023   PROT 6.3 (L) 03/15/2023   ALBUMIN 3.9 03/15/2023   AST 21 03/15/2023   ALT 20 03/15/2023   ALKPHOS 37 (L) 03/15/2023   BILITOT 0.5 03/15/2023   GFRNONAA >60 03/15/2023   GFRAA >60 03/13/2020    Lab Results  Component Value Date   CEA 0.8 07/13/2012   CA125 8 08/18/2016    Lab Results  Component Value Date   INR 1.0 11/02/2022   LABPROT 12.9 11/02/2022    Imaging:  No results found.  Medications: I have reviewed the patient's current medications.   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 A 5 mm nodules remain on the right diaphragm. - TumorNext paired germline/tumor analyses:  No somatic variants detected, germline CHEK2      VUS      Cycle 1 of adjuvant Taxol/carboplatin chemotherapy initiated on 09/19/2012.   The CA 125 normalized.   She completed day 15 of cycle 6 on 02/06/2013.   Restaging CT evaluation 03/29/2013 showed no evidence of metastatic disease in the chest. There was marked improvement in appearance/resolution of previous described peritoneal/omental disease. There was no convincing evidence of residual disease. There was minimal increased density in the region of the omentum favored to be treatment-related. There was no pelvic adenopathy. CA125 3.2 on 02/19/2014. 08/18/2016 CA-125 8 CT abdomen/pelvis 08/25/2016-solitary new enlarged right external iliac lymph node measuring 2.2 cm. Biopsy 09/07/2016-adenocarcinoma consistent with high-grade serous carcinoma.  ER +90%, PR -0%; foundation 1-HRD not detected, microsatellite status cannot be determined, tumor mutation burden 8 PET scan 09/22/2016-malignant range FDG uptake associated with the enlarged right external iliac lymph node compatible with metastatic adenopathy.  No additional sites of metastatic disease identified. Radiation 10/25/2016-12/01/2016 PET scan 03/28/2017-previously enlarged hypermetabolic right external iliac node-normal in size with resolution of hypermetabolic activity, no active malignancy identified PET scan 05/15/2018-no evidence of recurrent or metastatic disease, no hypermetabolic lymph nodes CT abdomen/pelvis 09/19/2019-mild increase in the size of several left upper quadrant peritoneal nodules measuring up to 11 mm.  No other sites of metastatic disease identified.  No ascites. CT abdomen/pelvis 03/14/2020-slight enlargement of several left upper quadrant peritoneal nodules, no ascites, no other evidence  of disease progression CT abdomen/pelvis 09/12/2020 peritoneal nodularity/omental caking predominantly in the left upper/mid abdomen, mildly progressive.  Largest implant in the left  upper abdomen adjacent to the stomach now measures 17 mm, previously 13 mm.  Overall volume of peritoneal disease has mildly progressed. CT abdomen/pelvis 01/13/2021- 4 mm right ureteral calculus with moderate right hydronephrosis and upper right hydroureter, progressive omental nodularity, stable calcified and partially calcified right pelvic nodules CT abdomen/pelvis 05/13/2021-enlargement of left upper quadrant omental nodules and a peritoneal nodule at the right iliac fossa, no ascites CT abdomen/pelvis 08/14/2021-mild increase in size of omental nodules, no ascites, no new site of metastatic disease Taxol/carboplatin 09/09/2021, 09/15/2021, 09/22/2021 Taxol/carboplatin 10/07/2021, 10/13/2021, 10/20/2021 Taxol/Carboplatin 11/03/2021, 11/10/2021, 11/17/2021 CT 11/23/2021-decrease in peritoneal metastases, no new or progressive disease, airspace opacity at both lung bases Taxol/carboplatin 12/01/2021, 12/08/2021, 12/15/2021 Taxol/Carboplatin every 2 weeks beginning 12/29/2021 Carboplatin alone 01/26/2022, Taxol held due to neuropathy Carboplatin alone 02/09/2022, Taxol held due to neuropathy Carboplatin alone 02/23/2022, Taxol held due to neuropathy Carboplatin alone 03/09/2022, Taxol held due to neuropathy CTs 03/25/2022-no significant change in omental, mesenteric disease.  No new or clearly progressive findings.  Previously noted patchy areas of consolidative opacity at the lung bases have resolved. Treatment break beginning 03/26/2022 CT/pelvis 08/10/2022-increased size of peritoneal nodules, increased small volume ascites, mild right lower lobe pleural nodularity Cycle 1 salvage carboplatin 09/14/2022 (weekly x 3 followed by 1 week break) Cycle 2 salvage carboplatin 10/12/2022 (weekly x 3 followed by 1 week break) Cycle 3 salvage carboplatin 11/16/2022 (weekly x 3 followed by 1 week break) CT abdomen/pelvis 12/08/2022-mild increase in peritoneal carcinomatosis Tamoxifen 20 mg daily 01/07/2023   2. Low  abdomen/suprapubic pain prior to the exploratory laparotomy-likely secondary to omental/pelvic tumor; persistent mild pain in the lower abdomen   3. Chronic neck and back pain.   4. Anxiety -persistent despite Lexapro and Xanax. She has been evaluated by psychiatry. Currently on Lexapro. 5. Status post Port-A-Cath placement 09/22/2012. The Port-A-Cath was removed on 04/03/2013.   6. Neutropenia secondary to chemotherapy- day 15 cycle 1 and cycle 3. Taxol/carboplatin not given secondary to neutropenia.    7. Herpes zoster involving a right thoracic dermatome July 2015. She completed a course of Valtrex. 8. Nodular bony prominence at the left pelvis on rectal exam 06/05/2015-likely a benign finding 9.  Right ureter stone/hydroureteronephrosis on CT 01/13/2021-referred to urology; status post right ureteroscopy with stone extraction and ureteral stent placement.  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  Right hip fracture following a fall 11/02/2022-status post right hip anterior hemiarthroplasty 12.  Acute localized thrombus right proximal femoral vein 11/16/2022 Eliquis initiated     Disposition: [name] [name] appears unchanged.  She has been maintained on tamoxifen for the past 2 months.  The CA125 was higher last month.  We will follow-up on the CA125 today.  She will undergo a restaging CT abdomen/pelvis in 2 weeks.  We will see her after the CT.  The plan is to change to Doxil if the CT reveals evidence of disease progression.  Thornton Papas, MD  03/15/2023  9:05 AM

## 2023-03-16 DIAGNOSIS — M25651 Stiffness of right hip, not elsewhere classified: Secondary | ICD-10-CM | POA: Diagnosis not present

## 2023-03-16 DIAGNOSIS — M25551 Pain in right hip: Secondary | ICD-10-CM | POA: Diagnosis not present

## 2023-03-17 LAB — CA 125: Cancer Antigen (CA) 125: 553 U/mL — ABNORMAL HIGH (ref 0.0–38.1)

## 2023-03-18 ENCOUNTER — Telehealth: Payer: Self-pay | Admitting: *Deleted

## 2023-03-18 NOTE — Telephone Encounter (Signed)
LVM with results of CA125 (improved).

## 2023-03-29 ENCOUNTER — Encounter (HOSPITAL_BASED_OUTPATIENT_CLINIC_OR_DEPARTMENT_OTHER): Payer: Self-pay

## 2023-03-29 ENCOUNTER — Ambulatory Visit (HOSPITAL_BASED_OUTPATIENT_CLINIC_OR_DEPARTMENT_OTHER)
Admission: RE | Admit: 2023-03-29 | Discharge: 2023-03-29 | Disposition: A | Discharge status: Home / Self Care | Payer: Medicare Other | Source: Ambulatory Visit | Attending: Oncology | Admitting: Oncology

## 2023-03-29 ENCOUNTER — Inpatient Hospital Stay: Payer: Medicare Other

## 2023-03-29 DIAGNOSIS — J9811 Atelectasis: Secondary | ICD-10-CM | POA: Diagnosis not present

## 2023-03-29 DIAGNOSIS — N281 Cyst of kidney, acquired: Secondary | ICD-10-CM | POA: Diagnosis not present

## 2023-03-29 DIAGNOSIS — C569 Malignant neoplasm of unspecified ovary: Secondary | ICD-10-CM | POA: Diagnosis not present

## 2023-03-29 DIAGNOSIS — K449 Diaphragmatic hernia without obstruction or gangrene: Secondary | ICD-10-CM | POA: Insufficient documentation

## 2023-03-29 DIAGNOSIS — Z95828 Presence of other vascular implants and grafts: Secondary | ICD-10-CM | POA: Diagnosis not present

## 2023-03-29 DIAGNOSIS — I700 Atherosclerosis of aorta: Secondary | ICD-10-CM | POA: Insufficient documentation

## 2023-03-29 MED ORDER — HEPARIN SOD (PORK) LOCK FLUSH 100 UNIT/ML IV SOLN
500.0000 [IU] | Freq: Once | INTRAVENOUS | Status: AC
  Administered 2023-03-29: 500 [IU] via INTRAVENOUS

## 2023-03-29 MED ORDER — IOHEXOL 300 MG/ML  SOLN
100.0000 mL | Freq: Once | INTRAMUSCULAR | Status: AC | PRN
  Administered 2023-03-29: 75 mL via INTRAVENOUS

## 2023-03-30 DIAGNOSIS — M25651 Stiffness of right hip, not elsewhere classified: Secondary | ICD-10-CM | POA: Diagnosis not present

## 2023-03-30 DIAGNOSIS — M25551 Pain in right hip: Secondary | ICD-10-CM | POA: Diagnosis not present

## 2023-04-01 ENCOUNTER — Inpatient Hospital Stay: Payer: Medicare Other

## 2023-04-01 ENCOUNTER — Telehealth: Payer: Self-pay | Admitting: Oncology

## 2023-04-01 ENCOUNTER — Other Ambulatory Visit (HOSPITAL_BASED_OUTPATIENT_CLINIC_OR_DEPARTMENT_OTHER): Payer: Self-pay

## 2023-04-01 ENCOUNTER — Inpatient Hospital Stay: Payer: Medicare Other | Admitting: Oncology

## 2023-04-01 ENCOUNTER — Other Ambulatory Visit: Payer: Self-pay | Admitting: Oncology

## 2023-04-01 VITALS — BP 128/74 | HR 77 | Temp 98.2°F | Resp 18 | Ht 63.0 in | Wt 166.0 lb

## 2023-04-01 DIAGNOSIS — C569 Malignant neoplasm of unspecified ovary: Secondary | ICD-10-CM | POA: Diagnosis not present

## 2023-04-01 DIAGNOSIS — M25471 Effusion, right ankle: Secondary | ICD-10-CM | POA: Diagnosis not present

## 2023-04-01 DIAGNOSIS — Z79810 Long term (current) use of selective estrogen receptor modulators (SERMs): Secondary | ICD-10-CM | POA: Diagnosis not present

## 2023-04-01 DIAGNOSIS — C786 Secondary malignant neoplasm of retroperitoneum and peritoneum: Secondary | ICD-10-CM | POA: Diagnosis not present

## 2023-04-01 DIAGNOSIS — D701 Agranulocytosis secondary to cancer chemotherapy: Secondary | ICD-10-CM | POA: Diagnosis not present

## 2023-04-01 DIAGNOSIS — Z95828 Presence of other vascular implants and grafts: Secondary | ICD-10-CM

## 2023-04-01 DIAGNOSIS — M25472 Effusion, left ankle: Secondary | ICD-10-CM | POA: Diagnosis not present

## 2023-04-01 DIAGNOSIS — Z452 Encounter for adjustment and management of vascular access device: Secondary | ICD-10-CM | POA: Diagnosis not present

## 2023-04-01 DIAGNOSIS — R188 Other ascites: Secondary | ICD-10-CM | POA: Diagnosis not present

## 2023-04-01 DIAGNOSIS — G620 Drug-induced polyneuropathy: Secondary | ICD-10-CM | POA: Diagnosis not present

## 2023-04-01 DIAGNOSIS — R918 Other nonspecific abnormal finding of lung field: Secondary | ICD-10-CM | POA: Diagnosis not present

## 2023-04-01 MED ORDER — TAMOXIFEN CITRATE 20 MG PO TABS
20.0000 mg | ORAL_TABLET | Freq: Every day | ORAL | 1 refills | Status: DC
  Filled 2023-04-01 – 2023-04-11 (×4): qty 90, 90d supply, fill #0
  Filled 2023-04-11 – 2023-04-12 (×2): qty 30, 30d supply, fill #0
  Filled 2023-05-31: qty 30, 30d supply, fill #1
  Filled 2023-07-05: qty 30, 30d supply, fill #2

## 2023-04-01 MED ORDER — SODIUM CHLORIDE 0.9% FLUSH
10.0000 mL | INTRAVENOUS | Status: DC | PRN
  Administered 2023-04-01: 10 mL via INTRAVENOUS

## 2023-04-01 MED ORDER — HEPARIN SOD (PORK) LOCK FLUSH 100 UNIT/ML IV SOLN
500.0000 [IU] | Freq: Once | INTRAVENOUS | Status: AC
  Administered 2023-04-01: 500 [IU] via INTRAVENOUS

## 2023-04-01 NOTE — Patient Instructions (Signed)
Central Line, Adult A central line is a long, thin tube (catheter) that can be used to collect blood for testing or to give medicine through a vein. The tip of the central line ends in a large vein just above the heart (vena cava). A central line may be placed because: You need to get medicines or fluids through an IV for a long period of time. You need nutrition but cannot eat or absorb nutrients. The veins in your hands or arms are difficult to use for IV access. You need a blood transfusion. You need chemotherapy or dialysis. Types of central lines There are four main types of central lines: Peripherally inserted central catheter (PICC) line. This type is used for access of one week or longer. It can be used to draw blood and give fluids or medicines. A PICC looks like an IV tube, but it goes up the arm to the heart. It is usually inserted in the upper arm and taped in place on the arm. Tunneled central line. This type is used for long-term therapy and dialysis. It is placed in a large vein in the neck, chest, or groin. It is inserted through a small incision made over the vein, and then it is advanced to the heart. It is tunneled under the skin and brought out through a second incision. Non-tunneled central line. This type is used for short-term access, usually for a maximum of 7 days. It is often used in the emergency department. It is inserted in the neck, chest, or groin. Implanted port. This type is used for long-term therapy. It can stay in place longer than other types of central lines. It is normally inserted in the upper chest, but it can also be placed in the upper arm or the abdomen. It is inserted and removed with surgery, and it is accessed using a needle. The type of central line that you receive depends on how long you will need it, your medical condition, and the condition of your veins. Tell a health care provider about: Any allergies you have. All medicines you are taking,  including vitamins, herbs, eye drops, creams, and over-the-counter medicines. Any problems you or family members have had with anesthetic medicines. Any blood disorders you have. Any surgeries you have had. Any medical conditions you have. Whether you are pregnant or may be pregnant. What are the risks? Generally, placement and use of a central line is safe. However, problems may occur, including: Infection. A blood clot that blocks the central line or forms in the vein and travels to the heart. Bleeding from the place where the central line was inserted. Developing a hole or crack within the central line. If this happens, the central line will need to be replaced. Central line failure. The catheter moving or coming out of place. What happens before the procedure? Medicines Ask your health care provider about: Changing or stopping your regular medicines. This is especially important if you are taking diabetes medicines or blood thinners. Taking medicines such as aspirin and ibuprofen. These medicines can thin your blood. Do not take these medicines unless your health care provider tells you to take them. Taking over-the-counter medicines, vitamins, herbs, and supplements. General instructions Follow instructions from your health care provider about eating or drinking restrictions. Ask your health care provider: How your procedure site will be marked. What steps will be taken to help prevent infection. These steps may include: Removing hair at the procedure site. Washing skin with a germ-killing soap.   Plan to have a responsible adult take you home from the hospital or clinic. If you will be going home right after the procedure, plan to have a responsible adult care for you for the time you are told. This is important. What happens during the procedure? The procedure will vary depending on the type of central line being placed. In general: An IV will be inserted into one of your  veins. You will be given one or more of the following: A medicine to help you relax (sedative). A medicine to numb the area (local anesthetic). Your skin will be cleaned with a germ-killing (antiseptic) solution, and you may be covered with sterile drapes. Your blood pressure, heart rate, breathing rate, and blood oxygen level will be monitored during the procedure. The central line catheter will be inserted into the vein and advanced to the correct spot. The health care provider may use X-ray equipment to help guide the catheter to the right place. A bandage (dressing) will be placed over the insertion area. The procedure may vary among health care providers and hospitals. What can I expect after the procedure? Your blood pressure, heart rate, breathing rate, and blood oxygen level will be monitored until you leave the hospital or clinic. Antiseptic caps may be placed on the ends of the central line tubing. If you were given a sedative during the procedure, it can affect you for several hours. Do not drive or operate machinery until your health care provider says that it is safe. Follow these instructions at home: Flushing and cleaning the central line  Follow instructions from your health care provider about flushing and cleaning the central line and the area around it. Only use sterile supplies to flush the central line. Use supplies from your health care provider, a pharmacy, or another source that is recommended by your health care provider. Before you flush the central line or clean the central line or the area around it: Wash your hands with soap and water for at least 20 seconds. If soap and water are not available, use alcohol-based hand sanitizer. Clean the central line hub with rubbing alcohol. Unless directed otherwise by the manufacturer's instructions, scrub using a twisting motion and rub for 10 to 15 seconds or for 30 twists. Be sure you scrub the top of the hub, not just the  sides. Never reuse alcohol pads. Let the hub dry before use. Prevent it from touching anything while drying. Caring for the incision or central line site Check your incision or central line site every day for signs of infection. Check for: Redness, swelling, or pain. Fluid or blood. Warmth. Pus or a bad smell. Keep the insertion site of your central line clean and dry at all times. Change your dressing only as told by your health care provider. Keep your dressing dry. If it gets wet, have it changed as soon as possible. General instructions Follow instructions from your health care provider for the type of device that you have. Keep the tube clamped, unless it is being used. If the central line accidentally gets pulled on, make sure: The dressing is okay. There is no bleeding. The line has not been pulled out. Do not use scissors or sharp objects near the tube. Do not take baths, swim, or use a hot tub until your health care provider approves. Ask your health care provider if you may take showers. You may only be allowed to take sponge baths. Ask your health care provider what activities are   safe for you. You may be restricted from lifting or making repetitive arm movements on the side of your central line. Take over-the-counter and prescription medicines only as told by your health care provider. Keep all follow-up visits. This is important. Storage and disposal of supplies Keep your supplies in a clean, dry location. Throw away any used syringes in a disposal container that is meant for sharp items (sharps container). You can buy a sharps container from a pharmacy, or you can make one by using an empty hard plastic bottle with a cover. Place any used dressings or infusion bags into a plastic bag. Throw that bag in the trash. Contact a health care provider if: You have redness, swelling, or pain around your insertion site. You have fluid or blood coming from your insertion site. Your  insertion site feels warm to the touch. You have pus or a bad smell coming from your insertion site. Get help right away if: You have: A fever or chills. Shortness of breath. Chest pain or a racing heartbeat. Swelling in your neck, face, chest, or arm on the side of your central line. You feel dizzy or you faint. Your incision or central line site has red streaks spreading away from the area. Your incision or central line site is bleeding and does not stop. Your central line is difficult to flush or will not flush. You do not get a blood return from the central line. Your central line gets loose or damaged or comes out. Your catheter leaks when flushed or when fluids are infused into it. Summary A central line is a long, thin tube (catheter) that can be used to give medicine through a vein. Follow specific instructions from your health care provider for the type of device that you have. Keep the insertion site of your central line clean and dry at all times. Keep the tube clamped, unless it is being used. This information is not intended to replace advice given to you by your health care provider. Make sure you discuss any questions you have with your health care provider. Document Revised: 05/22/2020 Document Reviewed: 05/22/2020 Elsevier Patient Education  2024 [employer]  

## 2023-04-01 NOTE — Telephone Encounter (Signed)
Scheduled appointments per 6/28 los. Patient is aware of the made appointments.  

## 2023-04-01 NOTE — Progress Notes (Signed)
[facility] OFFICE PROGRESS NOTE   Diagnosis: Ovarian cancer  INTERVAL HISTORY:   [name] returns as scheduled.  She generally feels well.  She continues tamoxifen.  No difficulty with bowel function.  She has noted swelling at the ankles.  She continues anticoagulation therapy.  Objective:  Vital signs in last 24 hours:  Blood pressure 128/74, pulse 77, temperature 98.2 F (36.8 C), temperature source Oral, resp. rate 18, height 5\' 3"  (1.6 m), weight 166 lb (75.3 kg), SpO2 98 %.     Resp: Lungs clear bilaterally Cardio: Regular rate and rhythm GI: No hepatosplenomegaly, no mass, nontender, no apparent ascites Vascular: No leg edema    Portacath/PICC-without erythema  Lab Results:  Lab Results  Component Value Date   WBC 5.1 12/14/2022   HGB 12.5 12/14/2022   HCT 38.7 12/14/2022   MCV 102.7 (H) 12/14/2022   PLT 279 12/14/2022   NEUTROABS 3.7 12/14/2022    CMP  Lab Results  Component Value Date   NA 139 03/15/2023   K 3.8 03/15/2023   CL 105 03/15/2023   CO2 27 03/15/2023   GLUCOSE 86 03/15/2023   BUN 21 03/15/2023   CREATININE 0.66 03/15/2023   CALCIUM 8.8 (L) 03/15/2023   PROT 6.3 (L) 03/15/2023   ALBUMIN 3.9 03/15/2023   AST 21 03/15/2023   ALT 20 03/15/2023   ALKPHOS 37 (L) 03/15/2023   BILITOT 0.5 03/15/2023   GFRNONAA >60 03/15/2023   GFRAA >60 03/13/2020    Lab Results  Component Value Date   CEA 0.8 07/13/2012   CA125 8 08/18/2016     Imaging:  CT Abdomen Pelvis W Contrast  Result Date: 03/29/2023 CLINICAL DATA:  Restaging ovarian cancer. Original diagnosis 9 years ago with subsequent recurrence. * Tracking Code: BO * EXAM: CT ABDOMEN AND PELVIS WITH CONTRAST TECHNIQUE: Multidetector CT imaging of the abdomen and pelvis was performed using the standard protocol following bolus administration of intravenous contrast. RADIATION DOSE REDUCTION: This exam was performed according to the departmental dose-optimization program  which includes automated exposure control, adjustment of the mA and/or kV according to patient size and/or use of iterative reconstruction technique. CONTRAST:  75mL OMNIPAQUE IOHEXOL 300 MG/ML  SOLN COMPARISON:  Abdominopelvic CT 12/08/2022 and 08/10/2022. FINDINGS: Lower chest: Mild subpleural atelectasis at both lung bases. No significant pleural or pericardial effusion. Moderate size hiatal hernia with mild distal esophageal wall thickening, similar to previous study. Hepatobiliary: No intrinsic hepatic lesions are identified. The gallbladder is incompletely distended. No evidence of gallstones, gallbladder wall thickening or biliary dilatation. Pancreas: Unremarkable. No pancreatic ductal dilatation or surrounding inflammatory changes. Spleen: Normal in size without focal abnormality. Adrenals/Urinary Tract: Both adrenal glands appear normal. No evidence of urinary tract calculus, suspicious renal lesion or hydronephrosis. Unchanged renal sinus cysts bilaterally for which no follow-up imaging is recommended. The bladder appears unremarkable for its degree of distention, although is partly obscured by artifact from the right total hip arthroplasty. Stomach/Bowel: Enteric contrast has passed into the distal small bowel. As above, moderate-size hiatal hernia with distal esophageal wall thickening. The stomach otherwise appears unremarkable for its degree of distention. No evidence of bowel distension, wall thickening or surrounding inflammation. Patent rectal anastomosis. Mildly prominent stool throughout the colon. Vascular/Lymphatic: There are no enlarged abdominal or pelvic lymph nodes. Aortic and branch vessel atherosclerosis without evidence of aneurysm or large vessel occlusion. Reproductive: Status post hysterectomy.  No adnexal mass. Other: Interval increased volume of previously demonstrated ascites. Persistent diffuse peritoneal nodularity consistent  with carcinomatosis. The largest omental implants  demonstrated previously have decreased in size. For example, a central omental implant currently measuring 3.2 x 1.3 cm on image 38/2 previously measured up to 4.6 x 2.3 cm. Multiple left upper quadrant peritoneal implants also appears slightly smaller. No definite enlarging peritoneal implants are identified. There is diffuse peritoneal thickening, as before. Musculoskeletal: No acute or significant osseous findings. Multilevel spondylosis with a mild convex right lumbar scoliosis. Previous right hip hemiarthroplasty. IMPRESSION: 1. Interval increased volume of ascites with persistent diffuse peritoneal nodularity consistent with carcinomatosis. The largest omental implants demonstrated previously have decreased in size, and no enlarging implants are identified. 2. No evidence of solid visceral organ metastases. 3. Moderate-size hiatal hernia with distal esophageal wall thickening, similar to the prior study. 4. No evidence of bowel obstruction or perforation. 5.  Aortic Atherosclerosis (ICD10-I70.0). Electronically Signed   By: Carey Bullocks M.D.   On: 03/29/2023 13:40    Medications: I have reviewed the patient's current medications.   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 A 5 mm nodules remain on the right diaphragm. - TumorNext paired germline/tumor analyses: No somatic variants detected, germline CHEK2      VUS      Cycle 1 of adjuvant Taxol/carboplatin chemotherapy initiated on 09/19/2012.   The CA 125 normalized.   She completed day 15 of cycle 6 on 02/06/2013.   Restaging CT evaluation 03/29/2013 showed no evidence of metastatic disease in the chest. There was marked improvement in appearance/resolution of previous described peritoneal/omental disease. There was no convincing evidence of residual disease. There was minimal  increased density in the region of the omentum favored to be treatment-related. There was no pelvic adenopathy. CA125 3.2 on 02/19/2014. 08/18/2016 CA-125 8 CT abdomen/pelvis 08/25/2016-solitary new enlarged right external iliac lymph node measuring 2.2 cm. Biopsy 09/07/2016-adenocarcinoma consistent with high-grade serous carcinoma.  ER +90%, PR -0%; foundation 1-HRD not detected, microsatellite status cannot be determined, tumor mutation burden 8, FOLR1 positive-80% PET scan 09/22/2016-malignant range FDG uptake associated with the enlarged right external iliac lymph node compatible with metastatic adenopathy.  No additional sites of metastatic disease identified. Radiation 10/25/2016-12/01/2016 PET scan 03/28/2017-previously enlarged hypermetabolic right external iliac node-normal in size with resolution of hypermetabolic activity, no active malignancy identified PET scan 05/15/2018-no evidence of recurrent or metastatic disease, no hypermetabolic lymph nodes CT abdomen/pelvis 09/19/2019-mild increase in the size of several left upper quadrant peritoneal nodules measuring up to 11 mm.  No other sites of metastatic disease identified.  No ascites. CT abdomen/pelvis 03/14/2020-slight enlargement of several left upper quadrant peritoneal nodules, no ascites, no other evidence of disease progression CT abdomen/pelvis 09/12/2020 peritoneal nodularity/omental caking predominantly in the left upper/mid abdomen, mildly progressive.  Largest implant in the left upper abdomen adjacent to the stomach now measures 17 mm, previously 13 mm.  Overall volume of peritoneal disease has mildly progressed. CT abdomen/pelvis 01/13/2021- 4 mm right ureteral calculus with moderate right hydronephrosis and upper right hydroureter, progressive omental nodularity, stable calcified and partially calcified right pelvic nodules CT abdomen/pelvis 05/13/2021-enlargement of left upper quadrant omental nodules and a peritoneal nodule at  the right iliac fossa, no ascites CT abdomen/pelvis 08/14/2021-mild increase in size of omental nodules, no ascites, no new site of metastatic disease Taxol/carboplatin 09/09/2021, 09/15/2021, 09/22/2021 Taxol/carboplatin 10/07/2021, 10/13/2021, 10/20/2021 Taxol/Carboplatin 11/03/2021, 11/10/2021, 11/17/2021 CT 11/23/2021-decrease in peritoneal metastases, no new or progressive disease, airspace opacity at both  lung bases Taxol/carboplatin 12/01/2021, 12/08/2021, 12/15/2021 Taxol/Carboplatin every 2 weeks beginning 12/29/2021 Carboplatin alone 01/26/2022, Taxol held due to neuropathy Carboplatin alone 02/09/2022, Taxol held due to neuropathy Carboplatin alone 02/23/2022, Taxol held due to neuropathy Carboplatin alone 03/09/2022, Taxol held due to neuropathy CTs 03/25/2022-no significant change in omental, mesenteric disease.  No new or clearly progressive findings.  Previously noted patchy areas of consolidative opacity at the lung bases have resolved. Treatment break beginning 03/26/2022 CT/pelvis 08/10/2022-increased size of peritoneal nodules, increased small volume ascites, mild right lower lobe pleural nodularity Cycle 1 salvage carboplatin 09/14/2022 (weekly x 3 followed by 1 week break) Cycle 2 salvage carboplatin 10/12/2022 (weekly x 3 followed by 1 week break) Cycle 3 salvage carboplatin 11/16/2022 (weekly x 3 followed by 1 week break) CT abdomen/pelvis 12/08/2022-mild increase in peritoneal carcinomatosis Tamoxifen 20 mg daily 01/07/2023 CT abdomen/pelvis 03/29/2023-increased ascites with persistent diffuse peritoneal nodularity, largest omental implants have decreased in size.  No enlarging implants.,  No evidence of bowel obstruction   2. Low abdomen/suprapubic pain prior to the exploratory laparotomy-likely secondary to omental/pelvic tumor; persistent mild pain in the lower abdomen   3. Chronic neck and back pain.   4. Anxiety -persistent despite Lexapro and Xanax. She has been evaluated by psychiatry.  Currently on Lexapro. 5. Status post Port-A-Cath placement 09/22/2012. The Port-A-Cath was removed on 04/03/2013.   6. Neutropenia secondary to chemotherapy- day 15 cycle 1 and cycle 3. Taxol/carboplatin not given secondary to neutropenia.    7. Herpes zoster involving a right thoracic dermatome July 2015. She completed a course of Valtrex. 8. Nodular bony prominence at the left pelvis on rectal exam 06/05/2015-likely a benign finding 9.  Right ureter stone/hydroureteronephrosis on CT 01/13/2021-referred to urology; status post right ureteroscopy with stone extraction and ureteral stent placement.  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  Right hip fracture following a fall 11/02/2022-status post right hip anterior hemiarthroplasty 12.  Acute localized thrombus right proximal femoral vein 11/16/2022 Eliquis initiated       Disposition: [name] has ovarian cancer.  She has been maintained on tamoxifen since 01/07/2023.  The CA125 was lower on 03/15/2023 and the restaging CT reveals a decrease in the size of the dominant peritoneal masses.  The plan is to continue tamoxifen.  We we will check a CA125 today.  She will return for an office visit and CA125 in 4-6 weeks.  We will plan for a restaging CT in 3 months.  The tumor from 2017 returned FOLR1 positive, 80%.  She may be a candidate for a targeted therapy when there is evidence of disease progression.  Thornton Papas, MD  04/01/2023  10:51 AM

## 2023-04-02 LAB — CA 125: Cancer Antigen (CA) 125: 288 U/mL — ABNORMAL HIGH (ref 0.0–38.1)

## 2023-04-08 ENCOUNTER — Other Ambulatory Visit (HOSPITAL_BASED_OUTPATIENT_CLINIC_OR_DEPARTMENT_OTHER): Payer: Self-pay

## 2023-04-11 ENCOUNTER — Other Ambulatory Visit (HOSPITAL_BASED_OUTPATIENT_CLINIC_OR_DEPARTMENT_OTHER): Payer: Self-pay

## 2023-04-11 ENCOUNTER — Other Ambulatory Visit: Payer: Self-pay | Admitting: Nurse Practitioner

## 2023-04-11 DIAGNOSIS — Z1231 Encounter for screening mammogram for malignant neoplasm of breast: Secondary | ICD-10-CM

## 2023-04-12 ENCOUNTER — Other Ambulatory Visit (HOSPITAL_BASED_OUTPATIENT_CLINIC_OR_DEPARTMENT_OTHER): Payer: Self-pay

## 2023-05-02 ENCOUNTER — Ambulatory Visit: Payer: Medicare Other

## 2023-05-04 ENCOUNTER — Other Ambulatory Visit: Payer: Self-pay

## 2023-05-04 ENCOUNTER — Ambulatory Visit (INDEPENDENT_AMBULATORY_CARE_PROVIDER_SITE_OTHER): Payer: Medicare Other

## 2023-05-04 ENCOUNTER — Emergency Department: Payer: Medicare Other

## 2023-05-04 ENCOUNTER — Emergency Department
Admission: EM | Admit: 2023-05-04 | Discharge: 2023-05-04 | Disposition: A | Discharge status: Home / Self Care | Payer: Medicare Other | Attending: Emergency Medicine | Admitting: Emergency Medicine

## 2023-05-04 ENCOUNTER — Encounter: Payer: Self-pay | Admitting: Oncology

## 2023-05-04 ENCOUNTER — Telehealth: Payer: Self-pay | Admitting: *Deleted

## 2023-05-04 VITALS — Ht 65.0 in | Wt 165.0 lb

## 2023-05-04 DIAGNOSIS — R0602 Shortness of breath: Secondary | ICD-10-CM | POA: Diagnosis not present

## 2023-05-04 DIAGNOSIS — Z86711 Personal history of pulmonary embolism: Secondary | ICD-10-CM | POA: Diagnosis not present

## 2023-05-04 DIAGNOSIS — Z8543 Personal history of malignant neoplasm of ovary: Secondary | ICD-10-CM | POA: Diagnosis not present

## 2023-05-04 DIAGNOSIS — Z7901 Long term (current) use of anticoagulants: Secondary | ICD-10-CM | POA: Diagnosis not present

## 2023-05-04 DIAGNOSIS — J929 Pleural plaque without asbestos: Secondary | ICD-10-CM | POA: Diagnosis not present

## 2023-05-04 DIAGNOSIS — J90 Pleural effusion, not elsewhere classified: Secondary | ICD-10-CM | POA: Diagnosis not present

## 2023-05-04 DIAGNOSIS — Z0000 Encounter for general adult medical examination without abnormal findings: Secondary | ICD-10-CM | POA: Diagnosis not present

## 2023-05-04 DIAGNOSIS — R918 Other nonspecific abnormal finding of lung field: Secondary | ICD-10-CM | POA: Diagnosis not present

## 2023-05-04 DIAGNOSIS — R188 Other ascites: Secondary | ICD-10-CM | POA: Diagnosis not present

## 2023-05-04 DIAGNOSIS — U071 COVID-19: Secondary | ICD-10-CM | POA: Insufficient documentation

## 2023-05-04 DIAGNOSIS — J9811 Atelectasis: Secondary | ICD-10-CM | POA: Diagnosis not present

## 2023-05-04 LAB — TROPONIN I (HIGH SENSITIVITY): Troponin I (High Sensitivity): 4 ng/L (ref <18)

## 2023-05-04 LAB — CBC
HCT: 39 % (ref 36.0–46.0)
Hemoglobin: 13 g/dL (ref 12.0–15.0)
MCH: 32.3 pg (ref 26.0–34.0)
MCHC: 33.3 g/dL (ref 30.0–36.0)
MCV: 96.8 fL (ref 80.0–100.0)
Platelets: 233 10*3/uL (ref 150–400)
RBC: 4.03 MIL/uL (ref 3.87–5.11)
RDW: 14.2 % (ref 11.5–15.5)
WBC: 7.4 10*3/uL (ref 4.0–10.5)
nRBC: 0 % (ref 0.0–0.2)

## 2023-05-04 LAB — BASIC METABOLIC PANEL
Anion gap: 10 (ref 5–15)
BUN: 18 mg/dL (ref 8–23)
CO2: 22 mmol/L (ref 22–32)
Calcium: 8.4 mg/dL — ABNORMAL LOW (ref 8.9–10.3)
Chloride: 102 mmol/L (ref 98–111)
Creatinine, Ser: 0.78 mg/dL (ref 0.44–1.00)
GFR, Estimated: >60 mL/min (ref >60)
Glucose, Bld: 107 mg/dL — ABNORMAL HIGH (ref 70–99)
Potassium: 3.9 mmol/L (ref 3.5–5.1)
Sodium: 134 mmol/L — ABNORMAL LOW (ref 135–145)

## 2023-05-04 MED ORDER — GUAIFENESIN 100 MG/5ML PO LIQD
5.0000 mL | Freq: Once | ORAL | Status: AC
  Administered 2023-05-04: 5 mL via ORAL
  Filled 2023-05-04: qty 10

## 2023-05-04 MED ORDER — IOHEXOL 350 MG/ML SOLN
75.0000 mL | Freq: Once | INTRAVENOUS | Status: AC | PRN
  Administered 2023-05-04: 75 mL via INTRAVENOUS

## 2023-05-04 MED ORDER — BENZONATATE 100 MG PO CAPS: 100.0000 mg | ORAL_CAPSULE | Freq: Three times a day (TID) | ORAL | 0 refills | Status: DC | PRN

## 2023-05-04 MED ORDER — SODIUM CHLORIDE 0.9 % IV BOLUS
1000.0000 mL | Freq: Once | INTRAVENOUS | Status: AC
  Administered 2023-05-04: 1000 mL via INTRAVENOUS

## 2023-05-04 MED ORDER — ACETAMINOPHEN 500 MG PO TABS
1000.0000 mg | ORAL_TABLET | Freq: Once | ORAL | Status: AC
  Administered 2023-05-04: 1000 mg via ORAL
  Filled 2023-05-04: qty 2

## 2023-05-04 NOTE — Discharge Instructions (Signed)
You were seen in the emergency department for shortness of breath and COVID.  You had a CT scan that did not show any signs of a blood clot.  Continue to take your blood thinner as prescribed.  Your CT scan showed some signs of pneumonia which is likely from your known COVID that you tested positive for.  Your heart enzymes were normal and your lab work was normal.  You can take 650 mg of Tylenol every 4 hours or 1000 mg of Tylenol every 6 hours for your sore throat and for any fever.  Stay hydrated and drink plenty of fluids.  You can drink fluids with Pedialyte, Gatorade/Powerade.  You can use throat coat hot tea with honey to help soothe your throat.  You could also try cold popsicles to soothe your throat.  You were given a prescription for Tessalon Perles which is a cough pill.  You can take 1 every 8 hours as needed for coughing.  If these do not improve your cough do not continue to take.  You can also take over-the-counter Robitussin or NyQuil for your cough.  Follow-up closely with your primary care physician and return to the emergency department for any worsening symptoms.

## 2023-05-04 NOTE — Telephone Encounter (Signed)
[name] [name] called to report she tested positive for COVID last night and feels terrible. Has cough, mild SOB and has not eaten in 2 days. Throat very sore and thinks she is dehydrated. Suggested she go to emergency room since she thinks she may need IVF. Encouraged her to wear mask.

## 2023-05-04 NOTE — ED Triage Notes (Signed)
Pt to ED For shob the past few days. Tested positive for COVID. NAD noted. Ambulatory to triage.

## 2023-05-04 NOTE — ED Provider Notes (Signed)
[facility] Provider Note    Event Date/Time   First MD Initiated Contact with Patient 05/04/23 1035     (approximate)   History   Shortness of Breath   HPI  [name] [name] is a 72 y.o. female past medical history significant for prior pulmonary embolism on Eliquis, presents to the emergency department with shortness of breath and chest pain.  States that she tested positive for COVID.  Worsening shortness of breath over the past couple of days.  States that she might have missed a couple of doses of her Eliquis.  Denies any active chest pain at this time.  Endorses an ongoing cough.  Has had some nausea but no episodes of vomiting.  Has been drinking some orange juice today but felt like it made her more nauseous.  Complaining of significant sore throat.  Denies any abdominal pain or diarrhea.  Denies dysuria.     Physical Exam   Triage Vital Signs: ED Triage Vitals  Encounter Vitals Group     BP 05/04/23 1000 117/80     Systolic BP Percentile --      Diastolic BP Percentile --      Pulse Rate 05/04/23 1000 (!) 106     Resp 05/04/23 0958 20     Temp 05/04/23 0958 99.9 F (37.7 C)     Temp src --      SpO2 05/04/23 1000 95 %     Weight 05/04/23 0959 165 lb (74.8 kg)     Height 05/04/23 0959 5\' 4"  (1.626 m)     Head Circumference --      Peak Flow --      Pain Score 05/04/23 0958 10     Pain Loc --      Pain Education --      Exclude from Growth Chart --     Most recent vital signs: Vitals:   05/04/23 1000 05/04/23 1128  BP: 117/80 128/84  Pulse: (!) 106 94  Resp:  18  Temp:    SpO2: 95% 95%    Physical Exam Constitutional:      Appearance: She is well-developed.  HENT:     Head: Atraumatic.     Mouth/Throat:     Pharynx: Posterior oropharyngeal erythema present. No oropharyngeal exudate.     Tonsils: No tonsillar exudate or tonsillar abscesses.  Eyes:     Conjunctiva/sclera: Conjunctivae normal.  Cardiovascular:     Rate and  Rhythm: Regular rhythm.  Pulmonary:     Effort: No respiratory distress.     Breath sounds: No wheezing.  Abdominal:     General: There is no distension.     Palpations: Abdomen is soft.     Tenderness: There is no abdominal tenderness.  Musculoskeletal:        General: Normal range of motion.     Cervical back: Normal range of motion.     Right lower leg: No edema.     Left lower leg: No edema.  Skin:    General: Skin is warm.     Capillary Refill: Capillary refill takes less than 2 seconds.  Neurological:     General: No focal deficit present.     Mental Status: She is alert. Mental status is at baseline.     IMPRESSION / MDM / ASSESSMENT AND PLAN / ED COURSE  I reviewed the triage vital signs and the nursing notes.  Differential diagnosis including COVID, anemia, pneumonia, pulmonary embolism, ACS, dehydration,  electrolyte abnormality  EKG  I, Corena Herter, the attending physician, personally viewed and interpreted this ECG.   Rate: 101  Rhythm: Sinus tachycardia  Axis: Normal  Intervals: Normal  ST&T Change: None  No tachycardic or bradycardic dysrhythmias while on cardiac telemetry.  RADIOLOGY I independently reviewed imaging, my interpretation of imaging: Chest x-ray with no focal findings consistent with pneumonia.  CTA with no signs of pulmonary embolism.  Read as small effusions with opacities  LABS (all labs ordered are listed, but only abnormal results are displayed) Labs interpreted as -    Labs Reviewed  BASIC METABOLIC PANEL - Abnormal; Notable for the following components:      Result Value   Sodium 134 (*)    Glucose, Bld 107 (*)    Calcium 8.4 (*)    All other components within normal limits  CBC  TROPONIN I (HIGH SENSITIVITY)  TROPONIN I (HIGH SENSITIVITY)     MDM  Troponin negative.  No significant anemia.  Creatinine at baseline with no significant electrolyte abnormalities.  Patient was given Tylenol for her sore throat.  Given  Robitussin for her cough.  Given IV fluids.  No signs of hypoxia.  Patient most likely has sequela of her COVID.  Do not feel that she needs to be started on antibiotic at this time.  Discussed symptomatic treatment.  Given return precautions for any worsening symptoms.  Discussed close follow-up with primary care provider.     PROCEDURES:  Critical Care performed: No  Procedures  Patient's presentation is most consistent with acute illness / injury with system symptoms.   MEDICATIONS ORDERED IN ED: Medications  sodium chloride 0.9 % bolus 1,000 mL (0 mLs Intravenous Stopped 05/04/23 1257)  iohexol (OMNIPAQUE) 350 MG/ML injection 75 mL (75 mLs Intravenous Contrast Given 05/04/23 1131)  guaiFENesin (ROBITUSSIN) 100 MG/5ML liquid 5 mL (5 mLs Oral Given 05/04/23 1325)  acetaminophen (TYLENOL) tablet 1,000 mg (1,000 mg Oral Given 05/04/23 1342)    FINAL CLINICAL IMPRESSION(S) / ED DIAGNOSES   Final diagnoses:  COVID     Rx / DC Orders   ED Discharge Orders     None        Note:  This document was prepared using Dragon voice recognition software and may include unintentional dictation errors.   Corena Herter, MD 05/04/23 1345

## 2023-05-04 NOTE — Progress Notes (Signed)
Subjective:   [name] [name] is a 72 y.o. female who presents for Medicare Annual (Subsequent) preventive examination.  Visit Complete: Virtual  I connected with  Thermon Leyland on 05/04/23 by a audio enabled telemedicine application and verified that I am speaking with the correct person using two identifiers.  Patient Location: Home  Provider Location: Home Office  I discussed the limitations of evaluation and management by telemedicine. The patient expressed understanding and agreed to proceed.  Vital Signs: Unable to obtain new vitals due to this being a telehealth visit.   Review of Systems      Cardiac Risk Factors include: advanced age (>69men, >62 women);dyslipidemia;sedentary lifestyle     Objective:    Today's Vitals   05/04/23 1523  Weight: 165 lb (74.8 kg)  Height: 5\' 5"  (1.651 m)  PainSc: 9    Body mass index is 27.46 kg/m.     05/04/2023    3:30 PM 05/04/2023   10:00 AM 03/15/2023    8:36 AM 02/23/2023    4:29 PM 02/10/2023    8:26 AM 01/07/2023    2:39 PM 12/14/2022    8:47 AM  Advanced Directives  Does Patient Have a Medical Advance Directive? Yes Yes No No No No Yes  Type of Estate agent of [place];Living will Healthcare Power of [employer] of [place];Living will  Does patient want to make changes to medical advance directive?      No - Patient declined No - Patient declined  Copy of Healthcare Power of Attorney in Chart? No - copy requested      No - copy requested  Would patient like information on creating a medical advance directive?      No - Patient declined     Current Medications (verified) Outpatient Encounter Medications as of 05/04/2023  Medication Sig   apixaban (ELIQUIS) 5 MG TABS tablet Take 1 tablet (5 mg total) by mouth 2 (two) times daily.   benzonatate (TESSALON PERLES) 100 MG capsule Take 1 capsule (100 mg total) by mouth 3 (three) times daily as needed for cough.   fish oil-omega-3 fatty  acids 1000 MG capsule Take 1 g by mouth daily.   Multiple Vitamin (MULTIVITAMIN WITH MINERALS) TABS Take 1 tablet by mouth daily.   prochlorperazine (COMPAZINE) 10 MG tablet Take 10 mg by mouth every 6 (six) hours as needed for nausea or vomiting.   Red Yeast Rice Extract 600 MG CAPS Take 1 capsule by mouth daily.   tamoxifen (NOLVADEX) 20 MG tablet Take 1 tablet (20 mg total) by mouth daily.   HYDROcodone-acetaminophen (NORCO) 7.5-325 MG tablet Take 1 tablet by mouth every 4 (four) hours as needed for severe pain (pain score 7-10). (Patient not taking: Reported on 03/15/2023)   Facility-Administered Encounter Medications as of 05/04/2023  Medication   0.9 %  sodium chloride infusion    Allergies (verified) Atorvastatin, Macrodantin, and Decongest-aid [pseudoephedrine]   History: Past Medical History:  Diagnosis Date   Anxiety and depression     DJD (degenerative joint disease)    Elevated cholesterol    Endometrial polyp    Family history of bladder cancer    Family history of lymphoma    History of radiation therapy 10/25/16-12/01/16   right pelvis/60.2 Gy in 28 fractions   IRRITABLE BOWEL SYNDROME, HX OF 05/15/2008   Kidney stone    MVP (mitral valve prolapse)    occ palpitations    Ovarian ca (HCC)  07/2012       Rheumatoid arthritis(714.0) ~ 2010   dr Dareen Piano   Shingles    Past Surgical History:  Procedure Laterality Date   ABDOMINAL HYSTERECTOMY  08/22/2012   Procedure: HYSTERECTOMY ABDOMINAL;  Surgeon: Jeannette Corpus, MD;  Location: [facility];  Service: Gynecology;  Laterality: N/A;   ANTERIOR APPROACH HEMI HIP ARTHROPLASTY Right 11/03/2022   Procedure: ANTERIOR APPROACH HEMI HIP ARTHROPLASTY;  Surgeon: Lyndle Herrlich, MD;  Location: [facility] OR[facility];  Service: Orthopedics;  Laterality: Right;   BACK SURGERY  10/04/2006   Dr Dutch Quint   COLONOSCOPY  04/20/2004   COLONOSCOPY W/ BIOPSIES  07/04/2014   COLOSTOMY REVISION  08/22/2012   Procedure: COLON RESECTION SIGMOID;   Surgeon: Jeannette Corpus, MD;  Location: [facility];  Service: Gynecology;;  Rectal Sigmoid resection and low rectal anastomosis   CYST ON NECK  10/04/2009   DEBULKING  08/22/2012   Procedure: DEBULKING;  Surgeon: Jeannette Corpus, MD;  Location: [facility];  Service: Gynecology;  Laterality: N/A;  Radical tumor debulking, Bilateral Ureterolysis   DILATION AND CURETTAGE OF UTERUS  10/04/2002   WITH HYSTEROSCOPY   HAND SURGERY  10/04/1994   HYSTEROSCOPY  10/04/2002   D&C   IR IMAGING GUIDED PORT INSERTION  09/01/2021   KIDNEY STONE SURGERY Right    with stent placement  Mar 03 2021   NECK SURGERY  10/05/2007   SPURS   OMENTECTOMY  08/22/2012   Procedure: OMENTECTOMY;  Surgeon: Jeannette Corpus, MD;  Location: [facility];  Service: Gynecology;  Laterality: N/A;   SALPINGOOPHORECTOMY  08/22/2012   Procedure: SALPINGO OOPHORECTOMY;  Surgeon: Jeannette Corpus, MD;  Location: [facility];  Service: Gynecology;  Laterality: Bilateral;   TUBAL LIGATION  10/04/1978   Family History  Problem Relation Age of Onset   Heart disease Mother    Bladder Cancer Father 97   Osteoporosis Sister    Lymphoma Paternal Aunt    Colon cancer Neg Hx    Rectal cancer Neg Hx    Stomach cancer Neg Hx    Diabetes Neg Hx    Stroke Neg Hx    Colon polyps Neg Hx    Social History   Socioeconomic History   Marital status: Legally Separated    Spouse name: Not on file   Number of children: 2   Years of education: Not on file   Highest education level: Not on file  Occupational History   Occupation: disability d/t RA  Tobacco Use   Smoking status: Never   Smokeless tobacco: Never  Vaping Use   Vaping status: Never Used  Substance and Sexual Activity   Alcohol use: No   Drug use: No   Sexual activity: Never    Birth control/protection: Post-menopausal, Surgical  Other Topics Concern   Not on file  Social History Narrative   Florence Canner is her daughter    Household-- pr and  husband   Social Determinants of Health   Financial Resource Strain: Low Risk  (05/04/2023)   Overall Financial Resource Strain (CARDIA)    Difficulty of Paying Living Expenses: Not hard at all  Food Insecurity: No Food Insecurity (05/04/2023)   Hunger Vital Sign    Worried About Running Out of Food in the Last Year: Never true    Ran Out of Food in the Last Year: Never true  Transportation Needs: No Transportation Needs (05/04/2023)   PRAPARE - Administrator, Civil Service (Medical): No  Lack of Transportation (Non-Medical): No  Physical Activity: Insufficiently Active (05/04/2023)   Exercise Vital Sign    Days of Exercise per Week: 4 days    Minutes of Exercise per Session: 30 min  Stress: No Stress Concern Present (05/04/2023)   [employer] of Occupational Health - Occupational Stress Questionnaire    Feeling of Stress : Not at all  Social Connections: Moderately Isolated (05/04/2023)   Social Connection and Isolation Panel [NHANES]    Frequency of Communication with Friends and Family: More than three times a week    Frequency of Social Gatherings with Friends and Family: More than three times a week    Attends Religious Services: More than 4 times per year    Active Member of [employer] or Organizations: No    Attends Banker Meetings: Never    Marital Status: Separated    Tobacco Counseling Counseling given: Not Answered   Clinical Intake:  Pre-visit preparation completed: Yes  Pain : No/denies pain Pain Score: 9  Pain Type: Acute pain Pain Location: Throat (due to COVID)     BMI - recorded: 27.46 Nutritional Status: BMI 25 -29 Overweight Nutritional Risks: Nausea/ vomitting/ diarrhea (Covid positive) Diabetes: No  How often do you need to have someone help you when you read instructions, pamphlets, or other written materials from your doctor or pharmacy?: 1 - Never  Interpreter Needed?: No  Information entered by :: C.  LPN   Activities of Daily Living    05/04/2023    3:32 PM 11/02/2022    1:40 PM  In your present state of health, do you have any difficulty performing the following activities:  Hearing? 1 0  Comment has some hearing loss   Vision? 0 0  Difficulty concentrating or making decisions? 1 0  Comment occasionally   Walking or climbing stairs? 1 0  Comment hip fracture   Dressing or bathing? 0 0  Doing errands, shopping? 0 1  Preparing Food and eating ? N   Using the Toilet? N   In the past six months, have you accidently leaked urine? Y   Comment occasionally when coughs   Do you have problems with loss of bowel control? N   Managing your Medications? N   Managing your Finances? N   Housekeeping or managing your Housekeeping? N     Patient Care Team: Eden Emms, NP as PCP - General Meryl Dare, MD as Consulting Physician (Gastroenterology) Michele Mcalpine, MD as Consulting Physician (Pulmonary Disease) [name], MD as Consulting Physician (Oncology) Doylene Bode, NP as Nurse Practitioner (Gynecologic Oncology) Carver Fila, MD as Consulting Physician (Gynecologic Oncology) Lyndle Herrlich, MD as Consulting Physician (Orthopedic Surgery)  Indicate any recent Medical Services you may have received from other than [facility] providers in the past year (date may be approximate).     Assessment:   This is a routine wellness examination for [place].  Hearing/Vision screen Hearing Screening - Comments:: Has hear loss in left ear. Vision Screening - Comments:: Contacts - Dr.[facility] - UTD on exams  Dietary issues and exercise activities discussed:     Goals Addressed             This Visit's Progress    Patient Stated       Stay active        Depression Screen    05/04/2023    3:29 PM 03/08/2022    9:45 AM 07/07/2021   11:42 AM 02/11/2021  10:05 AM 05/01/2020   10:32 AM 04/30/2019   11:33 AM 04/25/2018    9:31 AM  PHQ 2/9 Scores  PHQ - 2 Score 0 0 0  0 0 0 0  PHQ- 9 Score   8  0 11     Fall Risk    05/04/2023    3:31 PM 03/08/2022    9:45 AM 02/11/2021   10:06 AM 05/01/2020   10:32 AM 04/30/2019   11:33 AM  Fall Risk   Falls in the past year? 1 0 0 1 0  Number falls in past yr: 1 0  0 0  Injury with Fall? 1 0  0 0  Comment right hip      Risk for fall due to : Impaired balance/gait;Impaired mobility      Follow up Falls prevention discussed;Falls evaluation completed;Education provided  Falls evaluation completed      MEDICARE RISK AT HOME:  Medicare Risk at Home - 05/04/23 1533     Any stairs in or around the home? No    If so, are there any without handrails? No    Home free of loose throw rugs in walkways, pet beds, electrical cords, etc? Yes    Adequate lighting in your home to reduce risk of falls? Yes    Life alert? No    Use of a cane, walker or w/c? No    Grab bars in the bathroom? No    Shower chair or bench in shower? Yes    Elevated toilet seat or a handicapped toilet? Yes             TIMED UP AND GO:  Was the test performed?  No    Cognitive Function:        05/04/2023    3:34 PM  6CIT Screen  What Year? 0 points  What month? 0 points  What time? 0 points  Count back from 20 0 points  Months in reverse 0 points  Repeat phrase 2 points  Total Score 2 points    Immunizations Immunization History  Administered Date(s) Administered   Influenza Split 07/02/2011, 07/05/2012   Influenza Whole 08/04/2009   Influenza, High Dose Seasonal PF 08/26/2015   Influenza,inj,Quad PF,6+ Mos 09/11/2013, 08/02/2014   Pneumococcal Conjugate-13 03/08/2016   Pneumococcal Polysaccharide-23 10/18/2014   Td 10/18/2014    TDAP status: Up to date  Flu Vaccine status: Declined, Education has been provided regarding the importance of this vaccine but patient still declined. Advised may receive this vaccine at local pharmacy or Health Dept. Aware to provide a copy of the vaccination record if obtained from local  pharmacy or Health Dept. Verbalized acceptance and understanding.  Pneumococcal vaccine status: Declined,  Education has been provided regarding the importance of this vaccine but patient still declined. Advised may receive this vaccine at local pharmacy or Health Dept. Aware to provide a copy of the vaccination record if obtained from local pharmacy or Health Dept. Verbalized acceptance and understanding.   Covid-19 vaccine status: Information provided on how to obtain vaccines.   Qualifies for Shingles Vaccine? Yes   Zostavax completed No   Shingrix Completed?: No.    Education has been provided regarding the importance of this vaccine. Patient has been advised to call insurance company to determine out of pocket expense if they have not yet received this vaccine. Advised may also receive vaccine at local pharmacy or Health Dept. Verbalized acceptance and understanding.  Screening Tests Health Maintenance  Topic Date  Due   Zoster Vaccines- Shingrix (1 of 2) 08/04/2023 (Originally 11/19/1969)   Pneumonia Vaccine 93+ Years old (3 of 3 - PPSV23 or PCV20) 05/03/2024 (Originally 03/08/2021)   INFLUENZA VACCINE  05/05/2023   MAMMOGRAM  04/28/2024   Medicare Annual Wellness (AWV)  05/03/2024   DTaP/Tdap/Td (2 - Tdap) 10/18/2024   Colonoscopy  04/24/2027   DEXA SCAN  Completed   Hepatitis C Screening  Completed   HPV VACCINES  Aged Out   COVID-19 Vaccine  Discontinued    Health Maintenance  There are no preventive care reminders to display for this patient.   Colorectal cancer screening: Type of screening: Colonoscopy. Completed 04/23/22. Repeat every 5 years  Mammogram status: Ordered 04/11/23. Pt provided with contact info and advised to call to schedule appt.  Bone density - declined Lung Cancer Screening: (Low Dose CT Chest recommended if Age 50-80 years, 20 pack-year currently smoking OR have quit w/in 15years.) does not qualify.   Lung Cancer Screening Referral: no  Additional  Screening:  Hepatitis C Screening: does qualify; Completed 08/26/15  Vision Screening: Recommended annual ophthalmology exams for early detection of glaucoma and other disorders of the eye. Is the patient up to date with their annual eye exam?  Yes  Who is the provider or what is the name of the office in which the patient attends annual eye exams? [facility] If pt is not established with a provider, would they like to be referred to a provider to establish care? Yes .   Dental Screening: Recommended annual dental exams for proper oral hygiene   Community Resource Referral / Chronic Care Management: CRR required this visit?  No   CCM required this visit?  No     Plan:     I have personally reviewed and noted the following in the patient's chart:   Medical and social history Use of alcohol, tobacco or illicit drugs  Current medications and supplements including opioid prescriptions. Patient is not currently taking opioid prescriptions. Functional ability and status Nutritional status Physical activity Advanced directives List of other physicians Hospitalizations, surgeries, and ER visits in previous 12 months Vitals Screenings to include cognitive, depression, and falls Referrals and appointments  In addition, I have reviewed and discussed with patient certain preventive protocols, quality metrics, and best practice recommendations. A written personalized care plan for preventive services as well as general preventive health recommendations were provided to patient.     Maryan Puls, LPN   1/61/0960   After Visit Summary: (MyChart) Due to this being a telephonic visit, the after visit summary with patients personalized plan was offered to patient via MyChart   Nurse Notes: Per patient ,she is currently COVID positive.

## 2023-05-04 NOTE — Patient Instructions (Signed)
[name] , Thank you for taking time to come for your Medicare Wellness Visit. I appreciate your ongoing commitment to your health goals. Please review the following plan we discussed and let me know if I can assist you in the future.   Referrals/Orders/Follow-Ups/Clinician Recommendations: Aim for 30 minutes of exercise or brisk walking, 6-8 glasses of water, and 5 servings of fruits and vegetables each day.   This is a list of the screening recommended for you and due dates:  Health Maintenance  Topic Date Due   Zoster (Shingles) Vaccine (1 of 2) Never done   Pneumonia Vaccine (3 of 3 - PPSV23 or PCV20) 03/08/2021   Medicare Annual Wellness Visit  03/09/2023   Flu Shot  05/05/2023   Mammogram  04/28/2024   DTaP/Tdap/Td vaccine (2 - Tdap) 10/18/2024   Colon Cancer Screening  04/24/2027   DEXA scan (bone density measurement)  Completed   Hepatitis C Screening  Completed   HPV Vaccine  Aged Out   COVID-19 Vaccine  Discontinued    Advanced directives: (Copy Requested) Please bring a copy of your health care power of attorney and living will to the office to be added to your chart at your convenience.  Next Medicare Annual Wellness Visit scheduled for next year: Yes  Preventive Care 59 Years and Older, Female Preventive care refers to lifestyle choices and visits with your health care provider that can promote health and wellness. What does preventive care include? A yearly physical exam. This is also called an annual well check. Dental exams once or twice a year. Routine eye exams. Ask your health care provider how often you should have your eyes checked. Personal lifestyle choices, including: Daily care of your teeth and gums. Regular physical activity. Eating a healthy diet. Avoiding tobacco and drug use. Limiting alcohol use. Practicing safe sex. Taking low-dose aspirin every day. Taking vitamin and mineral supplements as recommended by your health care provider. What  happens during an annual well check? The services and screenings done by your health care provider during your annual well check will depend on your age, overall health, lifestyle risk factors, and family history of disease. Counseling  Your health care provider may ask you questions about your: Alcohol use. Tobacco use. Drug use. Emotional well-being. Home and relationship well-being. Sexual activity. Eating habits. History of falls. Memory and ability to understand (cognition). Work and work Astronomer. Reproductive health. Screening  You may have the following tests or measurements: Height, weight, and BMI. Blood pressure. Lipid and cholesterol levels. These may be checked every 5 years, or more frequently if you are over 73 years old. Skin check. Lung cancer screening. You may have this screening every year starting at age 73 if you have a 30-pack-year history of smoking and currently smoke or have quit within the past 15 years. Fecal occult blood test (FOBT) of the stool. You may have this test every year starting at age 73. Flexible sigmoidoscopy or colonoscopy. You may have a sigmoidoscopy every 5 years or a colonoscopy every 10 years starting at age 73. Hepatitis C blood test. Hepatitis B blood test. Sexually transmitted disease (STD) testing. Diabetes screening. This is done by checking your blood sugar (glucose) after you have not eaten for a while (fasting). You may have this done every 1-3 years. Bone density scan. This is done to screen for osteoporosis. You may have this done starting at age 73. Mammogram. This may be done every 1-2 years. Talk to your health care  provider about how often you should have regular mammograms. Talk with your health care provider about your test results, treatment options, and if necessary, the need for more tests. Vaccines  Your health care provider may recommend certain vaccines, such as: Influenza vaccine. This is recommended every  year. Tetanus, diphtheria, and acellular pertussis (Tdap, Td) vaccine. You may need a Td booster every 10 years. Zoster vaccine. You may need this after age 73. Pneumococcal 13-valent conjugate (PCV13) vaccine. One dose is recommended after age 73. Pneumococcal polysaccharide (PPSV23) vaccine. One dose is recommended after age 73. Talk to your health care provider about which screenings and vaccines you need and how often you need them. This information is not intended to replace advice given to you by your health care provider. Make sure you discuss any questions you have with your health care provider. Document Released: 10/17/2015 Document Revised: 06/09/2016 Document Reviewed: 07/22/2015 Elsevier Interactive Patient Education  2017 [employer].  Fall Prevention in the Home Falls can cause injuries. They can happen to people of all ages. There are many things you can do to make your home safe and to help prevent falls. What can I do on the outside of my home? Regularly fix the edges of walkways and driveways and fix any cracks. Remove anything that might make you trip as you walk through a door, such as a raised step or threshold. Trim any bushes or trees on the path to your home. Use bright outdoor lighting. Clear any walking paths of anything that might make someone trip, such as rocks or tools. Regularly check to see if handrails are loose or broken. Make sure that both sides of any steps have handrails. Any raised decks and porches should have guardrails on the edges. Have any leaves, snow, or ice cleared regularly. Use sand or salt on walking paths during winter. Clean up any spills in your garage right away. This includes oil or grease spills. What can I do in the bathroom? Use night lights. Install grab bars by the toilet and in the tub and shower. Do not use towel bars as grab bars. Use non-skid mats or decals in the tub or shower. If you need to sit down in the shower, use a  plastic, non-slip stool. Keep the floor dry. Clean up any water that spills on the floor as soon as it happens. Remove soap buildup in the tub or shower regularly. Attach bath mats securely with double-sided non-slip rug tape. Do not have throw rugs and other things on the floor that can make you trip. What can I do in the bedroom? Use night lights. Make sure that you have a light by your bed that is easy to reach. Do not use any sheets or blankets that are too big for your bed. They should not hang down onto the floor. Have a firm chair that has side arms. You can use this for support while you get dressed. Do not have throw rugs and other things on the floor that can make you trip. What can I do in the kitchen? Clean up any spills right away. Avoid walking on wet floors. Keep items that you use a lot in easy-to-reach places. If you need to reach something above you, use a strong step stool that has a grab bar. Keep electrical cords out of the way. Do not use floor polish or wax that makes floors slippery. If you must use wax, use non-skid floor wax. Do not have throw rugs  and other things on the floor that can make you trip. What can I do with my stairs? Do not leave any items on the stairs. Make sure that there are handrails on both sides of the stairs and use them. Fix handrails that are broken or loose. Make sure that handrails are as long as the stairways. Check any carpeting to make sure that it is firmly attached to the stairs. Fix any carpet that is loose or worn. Avoid having throw rugs at the top or bottom of the stairs. If you do have throw rugs, attach them to the floor with carpet tape. Make sure that you have a light switch at the top of the stairs and the bottom of the stairs. If you do not have them, ask someone to add them for you. What else can I do to help prevent falls? Wear shoes that: Do not have high heels. Have rubber bottoms. Are comfortable and fit you  well. Are closed at the toe. Do not wear sandals. If you use a stepladder: Make sure that it is fully opened. Do not climb a closed stepladder. Make sure that both sides of the stepladder are locked into place. Ask someone to hold it for you, if possible. Clearly mark and make sure that you can see: Any grab bars or handrails. First and last steps. Where the edge of each step is. Use tools that help you move around (mobility aids) if they are needed. These include: Canes. Walkers. Scooters. Crutches. Turn on the lights when you go into a dark area. Replace any light bulbs as soon as they burn out. Set up your furniture so you have a clear path. Avoid moving your furniture around. If any of your floors are uneven, fix them. If there are any pets around you, be aware of where they are. Review your medicines with your doctor. Some medicines can make you feel dizzy. This can increase your chance of falling. Ask your doctor what other things that you can do to help prevent falls. This information is not intended to replace advice given to you by your health care provider. Make sure you discuss any questions you have with your health care provider. Document Released: 07/17/2009 Document Revised: 02/26/2016 Document Reviewed: 10/25/2014 Elsevier Interactive Patient Education  2017 [employer].

## 2023-05-04 NOTE — ED Notes (Signed)
Pt taken to CT.

## 2023-05-05 ENCOUNTER — Telehealth: Payer: Self-pay

## 2023-05-05 ENCOUNTER — Inpatient Hospital Stay: Payer: Medicare Other

## 2023-05-05 ENCOUNTER — Inpatient Hospital Stay: Payer: Medicare Other | Admitting: Oncology

## 2023-05-05 NOTE — Transitions of Care (Post Inpatient/ED Visit) (Signed)
   05/05/2023  Name: [name] [name] MRN: [id number] DOB: 11/09/1950  Today's TOC FU Call Status: Today's TOC FU Call Status:: Successful TOC FU Call Completed TOC FU Call Complete Date: 05/05/23  Transition Care Management Follow-up Telephone Call Date of Discharge: 05/04/23 Discharge Facility: [facility]) Type of Discharge: Emergency Department Primary Inpatient Discharge Diagnosis:: COVID How have you been since you were released from the hospital?: Better Any questions or concerns?: No  Items Reviewed: Did you receive and understand the discharge instructions provided?: Yes Medications obtained,verified, and reconciled?: Yes (Medications Reviewed) Any new allergies since your discharge?: No Dietary orders reviewed?: Yes Do you have support at home?: Yes  Medications Reviewed Today: Medications Reviewed Today     Reviewed by Merleen Nicely, LPN (Licensed Practical Nurse) on 05/05/23 at [phone]  Med List Status: <None>   Medication Order Taking? Sig Documenting Provider Last Dose Status Informant  0.9 %  sodium chloride infusion [id number]   Meryl Dare, MD  Active   apixaban (ELIQUIS) 5 MG TABS tablet [id number] Yes Take 1 tablet (5 mg total) by mouth 2 (two) times daily. [name], MD Taking Active   benzonatate (TESSALON PERLES) 100 MG capsule [id number] Yes Take 1 capsule (100 mg total) by mouth 3 (three) times daily as needed for cough. Corena Herter, MD Taking Active   fish oil-omega-3 fatty acids 1000 MG capsule [id number] Yes Take 1 g by mouth daily. [provider] Taking Active Self, Pharmacy Records  HYDROcodone-acetaminophen [facility]) 7.5-325 MG tablet [id number] Yes Take 1 tablet by mouth every 4 (four) hours as needed for severe pain (pain score 7-10). Altamese Cabal, PA-C Taking Active   Multiple Vitamin (MULTIVITAMIN WITH MINERALS) TABS [id number] Yes Take 1 tablet by mouth daily. [provider] Taking Active Self,  Pharmacy Records  prochlorperazine (COMPAZINE) 10 MG tablet [id number] Yes Take 10 mg by mouth every 6 (six) hours as needed for nausea or vomiting. [provider] Taking Active   Red Yeast Rice Extract 600 MG CAPS [id number] Yes Take 1 capsule by mouth daily. [provider] Taking Active Self, Pharmacy Records  tamoxifen (NOLVADEX) 20 MG tablet [id number] Yes Take 1 tablet (20 mg total) by mouth daily. [name], MD Taking Active             Home Care and Equipment/Supplies: Were Home Health Services Ordered?: NA Any new equipment or medical supplies ordered?: NA  Functional Questionnaire: Do you need assistance with bathing/showering or dressing?: No Do you need assistance with meal preparation?: No Do you need assistance with eating?: No Do you have difficulty maintaining continence: No Do you need assistance with getting out of bed/getting out of a chair/moving?: No Do you have difficulty managing or taking your medications?: No  Follow up appointments reviewed: PCP Follow-up appointment confirmed?: Yes Date of PCP follow-up appointment?: 05/11/23 Follow-up Provider: Mordecai Maes NP [facility] Follow-up appointment confirmed?: NA Do you need transportation to your follow-up appointment?: No Do you understand care options if your condition(s) worsen?: Yes-patient verbalized understanding    SIGNATURE  Woodfin Ganja LPN [facility] Nurse Health Advisor Direct Dial [phone]

## 2023-05-10 DIAGNOSIS — M5451 Vertebrogenic low back pain: Secondary | ICD-10-CM | POA: Diagnosis not present

## 2023-05-10 DIAGNOSIS — M419 Scoliosis, unspecified: Secondary | ICD-10-CM | POA: Diagnosis not present

## 2023-05-10 DIAGNOSIS — M5416 Radiculopathy, lumbar region: Secondary | ICD-10-CM | POA: Diagnosis not present

## 2023-05-10 DIAGNOSIS — M5136 Other intervertebral disc degeneration, lumbar region: Secondary | ICD-10-CM | POA: Diagnosis not present

## 2023-05-11 ENCOUNTER — Encounter: Payer: Self-pay | Admitting: Nurse Practitioner

## 2023-05-11 ENCOUNTER — Ambulatory Visit: Payer: Medicare Other | Admitting: Nurse Practitioner

## 2023-05-11 VITALS — BP 118/76 | HR 60 | Temp 98.4°F | Ht 64.0 in | Wt 155.4 lb

## 2023-05-11 DIAGNOSIS — R600 Localized edema: Secondary | ICD-10-CM | POA: Diagnosis not present

## 2023-05-11 DIAGNOSIS — M8580 Other specified disorders of bone density and structure, unspecified site: Secondary | ICD-10-CM | POA: Diagnosis not present

## 2023-05-11 DIAGNOSIS — Z0000 Encounter for general adult medical examination without abnormal findings: Secondary | ICD-10-CM

## 2023-05-11 DIAGNOSIS — C569 Malignant neoplasm of unspecified ovary: Secondary | ICD-10-CM

## 2023-05-11 DIAGNOSIS — E785 Hyperlipidemia, unspecified: Secondary | ICD-10-CM

## 2023-05-11 DIAGNOSIS — H6123 Impacted cerumen, bilateral: Secondary | ICD-10-CM | POA: Diagnosis not present

## 2023-05-11 IMAGING — US IR IMAGING GUIDED PORT INSERTION
1 series · 1 of 1 positions shown · non-contrast
Comparison: none

CLINICAL DATA: Ovarian neoplasm, needs durable venous access for
planned treatment regimen
TECHNIQUE: The procedure, risks, benefits, and alternatives were explained to
the patient. Questions regarding the procedure were encouraged and
answered. The patient understands and consents to the procedure.

[Series 1: ir fluoro/shunt/fist · 1 of 1 slices shown]
[im 1/1]
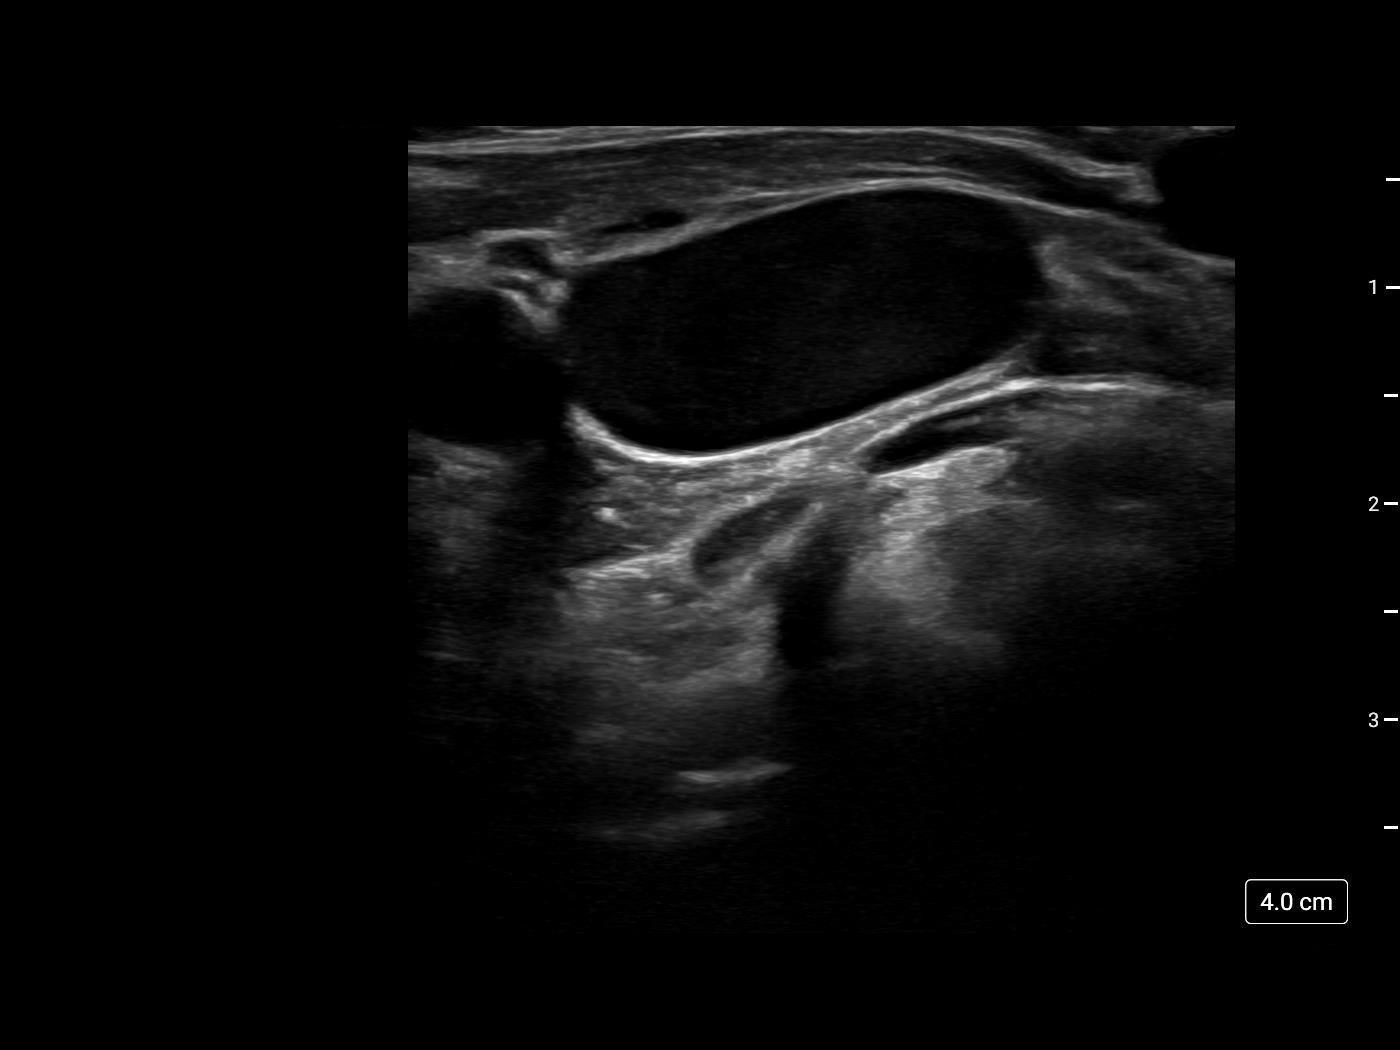

[1 of 1 positions shown; findings below may reference images not displayed]

EXAM:
TUNNELED PORT CATHETER PLACEMENT WITH ULTRASOUND AND FLUOROSCOPIC
GUIDANCE

FLUOROSCOPY TIME:  30 seconds; 1 mGy

ANESTHESIA/SEDATION:
Intravenous Fentanyl 39mcg and Versed 1mg were administered as
conscious sedation during continuous monitoring of the patient's
level of consciousness and physiological / cardiorespiratory status
by the radiology RN, with a total moderate sedation time of 19
minutes.
Patency of the right IJ vein was confirmed with ultrasound with
image documentation. An appropriate skin site was determined. Skin
site was marked. Region was prepped using maximum barrier technique
including cap and mask, sterile gown, sterile gloves, large sterile
sheet, and Chlorhexidine as cutaneous antisepsis. The region was
infiltrated locally with 1% lidocaine. Under real-time ultrasound
guidance, the right IJ vein was accessed with a 21 gauge
micropuncture needle; the needle tip within the vein was confirmed
with ultrasound image documentation. Needle was exchanged over a 018
guidewire for transitional dilator, and vascular measurement was
performed.

A small incision was made on the right anterior chest wall over the
old scar, and a subcutaneous pocket fashioned. The power-injectable
port was positioned and its catheter tunneled to the right IJ
dermatotomy site. The transitional dilator was exchanged over an
Amplatz wire for a peel-away sheath, through which the port
catheter, which had been trimmed to the appropriate length, was
advanced and positioned under fluoroscopy with its tip at the
cavoatrial junction. Spot chest radiograph confirms good catheter
position and no pneumothorax. The port was flushed per protocol. The
pocket was closed with deep interrupted and subcuticular continuous
3-0 Monocryl sutures. The incisions were covered with Dermabond then
covered with a sterile dressing.

The patient tolerated the procedure well.

COMPLICATIONS:
COMPLICATIONS
None immediate
IMPRESSION: Technically successful right IJ power-injectable port catheter
placement. Ready for routine use.

## 2023-05-11 NOTE — Assessment & Plan Note (Signed)
History of the same patient on red yeast rice extract and omega-3 fatty acid fish oils.  Pending lipid panel

## 2023-05-11 NOTE — Assessment & Plan Note (Addendum)
DEXA of 2023.  Repeat 2025.  Pending vitamin D level

## 2023-05-11 NOTE — Assessment & Plan Note (Signed)
Patient is followed by Dr. Thornton Papas.  Currently on tamoxifen and apixaban.  Continue medication as prescribed follow-up with oncology as recommended

## 2023-05-11 NOTE — Assessment & Plan Note (Signed)
Likely from dependency pending BNP today

## 2023-05-11 NOTE — Progress Notes (Signed)
Established Patient Office Visit  Subjective   Patient ID: [name] [name], female    DOB: 05-07-1951  Age: 72 y.o. MRN: [id number]  Chief Complaint  Patient presents with   Annual Exam    CPE/labs. Pt is fasting    HPI  for complete physical and follow up of chronic conditions.  Ovarian Cancer: Patient currently followed by oncology Dr. Thornton Papas.  Patient currently on tamoxifen and apixaban.  RA: was followed by spine. States that he went to [employer] and want to do back injectoins  HLD: Patient currently on red yeast rice and omega-3 fatty acids  Immunizations: -Tetanus: Completed in 2016 -Influenza: Out of season -Shingles: Discussed in office get local pharmacy -Pneumonia: Completed pneumococcal 23 and Prevnar 13 discussed Prevnar 20 patient declined at this juncture  Diet: Fair diet. States 3 meals and some snacks. States that she drinks water, ensure.  Exercise:  was doing therpay for a awhile. States isometric exercises and yard work   Eye exam: Completes annually.  Dental exam: needs updating    Colonoscopy: Completed in 04/23/2022, recommended to follow-up Lung Cancer Screening: N/A  Pap smear: Hysterectomy and aged out  Mammogram: Ordered last year, patient had a recent fall and had to cancel she will call or reschedule  Dexa: Done 04/01/2022 with osteopenia, repeat 2025  Sleep: states that she goes to bed around 11 and will get up around 6. Feels rested most of the time. Does not snore       Review of Systems  Constitutional:  Negative for chills and fever.  Respiratory:  Negative for shortness of breath.   Cardiovascular:  Negative for chest pain and leg swelling.  Gastrointestinal:  Negative for abdominal pain, blood in stool, constipation, diarrhea, nausea and vomiting.       BM every other day    Genitourinary:  Negative for dysuria and hematuria.  Neurological:  Positive for tingling (hands). Negative for headaches.   Psychiatric/Behavioral:  Negative for hallucinations and suicidal ideas.       Objective:     BP 118/76   Pulse 60   Temp 98.4 F (36.9 C) (Temporal)   Ht 5\' 4"  (1.626 m)   Wt 155 lb 6.4 oz (70.5 kg)   SpO2 92%   BMI 26.67 kg/m  BP Readings from Last 3 Encounters:  05/11/23 118/76  05/04/23 136/74  04/01/23 128/74   Wt Readings from Last 3 Encounters:  05/11/23 155 lb 6.4 oz (70.5 kg)  05/04/23 165 lb (74.8 kg)  05/04/23 165 lb (74.8 kg)      Physical Exam Vitals and nursing note reviewed.  Constitutional:      Appearance: Normal appearance.  HENT:     Right Ear: Ear canal and external ear normal. There is impacted cerumen.     Left Ear: Ear canal and external ear normal. There is impacted cerumen.     Mouth/Throat:     Mouth: Mucous membranes are moist.     Pharynx: Oropharynx is clear.  Eyes:     Extraocular Movements: Extraocular movements intact.     Pupils: Pupils are equal, round, and reactive to light.  Cardiovascular:     Rate and Rhythm: Normal rate and regular rhythm.     Pulses: Normal pulses.     Heart sounds: Normal heart sounds.  Pulmonary:     Effort: Pulmonary effort is normal.     Breath sounds: Normal breath sounds.  Abdominal:     General: Bowel sounds  are normal. There is no distension.     Palpations: There is no mass.     Tenderness: There is no abdominal tenderness.     Hernia: No hernia is present.  Musculoskeletal:     Right lower leg: Edema present.     Left lower leg: Edema present.  Lymphadenopathy:     Cervical: No cervical adenopathy.  Skin:    General: Skin is warm.  Neurological:     General: No focal deficit present.     Mental Status: She is alert.     Deep Tendon Reflexes:     Reflex Scores:      Bicep reflexes are 2+ on the right side and 2+ on the left side.      Patellar reflexes are 2+ on the right side and 2+ on the left side.    Comments: Bilateral upper and lower extremity strength 5/5  Psychiatric:         Mood and Affect: Mood normal.        Behavior: Behavior normal.        Thought Content: Thought content normal.        Judgment: Judgment normal.      No results found for any visits on 05/11/23.    The 10-year ASCVD risk score (Arnett DK, et al., 2019) is: 12.1%    Assessment & Plan:   Problem List Items Addressed This Visit       Endocrine   Ovarian cancer [facility])    Patient is followed by Dr. Thornton Papas.  Currently on tamoxifen and apixaban.  Continue medication as prescribed follow-up with oncology as recommended        Nervous and Auditory   Bilateral impacted cerumen    Verbal consent obtained.  Patient was prepped per office policy with cerumen softening eardrops.  A mixture of water hydroperoxide was used.  Bilateral ears irrigated.  Patient tolerated procedure impactions removed      Relevant Orders   Ear Lavage     Musculoskeletal and Integument   Osteopenia    DEXA of 2023.  Repeat 2025.  Pending vitamin D level      Relevant Orders   VITAMIN D 25 Hydroxy (Vit-D Deficiency, Fractures)     Other   Hyperlipidemia    History of the same patient on red yeast rice extract and omega-3 fatty acid fish oils.  Pending lipid panel      Relevant Orders   Lipid panel   Preventative health care - Primary    Discussed age-appropriate immunizations and screening exams.  Did review patient's personal, surgical, social, family histories.  Patient is up-to-date on all age-appropriate vaccinations that she would like.  Patient is up-to-date on CRC screening and is aged out for further surveillance.  Patient no longer participates in cervical cancer screening due 2 hysterectomy and being treated for ovarian cancer currently.  Patient's DEXA scan is up-to-date.  Patient is overdue for mammogram she will call and get that set up.  Patient was given information at discharge about preventative healthcare maintenance with anticipatory guidance      Relevant Orders   CBC    Comprehensive metabolic panel   TSH   Lower extremity edema    Likely from dependency pending BNP today      Relevant Orders   Brain natriuretic peptide   TSH    Return in about 1 year (around 05/10/2024) for CPE and Labs.    Audria Nine, NP

## 2023-05-11 NOTE — Patient Instructions (Addendum)
Nice to see you today I will be in touch with the labs once I have reviewed them Follow up with me in 1 year for your next physical, sooner if you need me   Call and get your mammogram scheduled

## 2023-05-11 NOTE — Assessment & Plan Note (Signed)
Discussed age-appropriate immunizations and screening exams.  Did review patient's personal, surgical, social, family histories.  Patient is up-to-date on all age-appropriate vaccinations that she would like.  Patient is up-to-date on CRC screening and is aged out for further surveillance.  Patient no longer participates in cervical cancer screening due 2 hysterectomy and being treated for ovarian cancer currently.  Patient's DEXA scan is up-to-date.  Patient is overdue for mammogram she will call and get that set up.  Patient was given information at discharge about preventative healthcare maintenance with anticipatory guidance

## 2023-05-11 NOTE — Assessment & Plan Note (Signed)
Verbal consent obtained.  Patient was prepped per office policy with cerumen softening eardrops.  A mixture of water hydroperoxide was used.  Bilateral ears irrigated.  Patient tolerated procedure impactions removed

## 2023-05-13 ENCOUNTER — Other Ambulatory Visit: Payer: Self-pay | Admitting: Nurse Practitioner

## 2023-05-13 DIAGNOSIS — E559 Vitamin D deficiency, unspecified: Secondary | ICD-10-CM

## 2023-05-13 MED ORDER — VITAMIN D (ERGOCALCIFEROL) 1.25 MG (50000 UNIT) PO CAPS
50000.0000 [IU] | ORAL_CAPSULE | ORAL | 0 refills | Status: DC
Start: 2023-05-13 — End: 2023-08-05

## 2023-05-18 ENCOUNTER — Telehealth: Payer: Self-pay | Admitting: Nurse Practitioner

## 2023-05-18 DIAGNOSIS — E785 Hyperlipidemia, unspecified: Secondary | ICD-10-CM

## 2023-05-18 NOTE — Telephone Encounter (Signed)
Called patient states she is concerned that it will increase the cramping in her legs. She stopped taking it in the past due to that. She would be willing to start but wants to know what medication she will be on and if it will make her legs cramp more.

## 2023-05-18 NOTE — Telephone Encounter (Signed)
Exact cholesterol number was ldl of 161 and we want it under 100. The medication is also used to reduce risk of a cardiovascular event (like stroke and heart attack). With her current numbers she has a little over a 11% chance of having an event over the next 10 years

## 2023-05-18 NOTE — Telephone Encounter (Signed)
It looks like she has tired pravastatin in the past and that is the least likely to cause cramping. I was going to try a Crestor (rosuvastatin) if that cause cramping then we would use something that is not a statin medication

## 2023-05-18 NOTE — Telephone Encounter (Signed)
-----   Message from [facility] T sent at 05/17/2023  2:58 PM EDT ----- Patient seen results via mychart. Called pt to ask about cholesterol medication. Pt states that she is already taking a lot of medication and wants to know her exact cholesterol number. Pt states that if it is not that bad, she would like to try lowering her cholesterol through diet first.

## 2023-05-19 DIAGNOSIS — M5416 Radiculopathy, lumbar region: Secondary | ICD-10-CM | POA: Diagnosis not present

## 2023-05-19 MED ORDER — ROSUVASTATIN CALCIUM 5 MG PO TABS
5.0000 mg | ORAL_TABLET | Freq: Every day | ORAL | 0 refills | Status: AC
Start: 2023-05-19 — End: ?

## 2023-05-19 NOTE — Telephone Encounter (Signed)
Need 3 month fasting lab follow up

## 2023-05-19 NOTE — Addendum Note (Signed)
Addended by: Eden Emms on: 05/19/2023 01:00 PM   Modules accepted: Orders

## 2023-05-19 NOTE — Telephone Encounter (Signed)
Spoke to pt, scheduled pt for 08/19/23

## 2023-05-19 NOTE — Telephone Encounter (Signed)
Called patient she is ok with starting new medications.

## 2023-05-31 ENCOUNTER — Inpatient Hospital Stay: Payer: Medicare Other | Attending: Oncology

## 2023-05-31 ENCOUNTER — Inpatient Hospital Stay: Payer: Medicare Other | Admitting: Oncology

## 2023-05-31 ENCOUNTER — Inpatient Hospital Stay: Payer: Medicare Other

## 2023-05-31 ENCOUNTER — Other Ambulatory Visit (HOSPITAL_BASED_OUTPATIENT_CLINIC_OR_DEPARTMENT_OTHER): Payer: Self-pay

## 2023-05-31 VITALS — BP 122/86 | HR 72 | Temp 98.1°F | Resp 18 | Ht 64.0 in | Wt 159.0 lb

## 2023-05-31 DIAGNOSIS — Z9079 Acquired absence of other genital organ(s): Secondary | ICD-10-CM | POA: Diagnosis not present

## 2023-05-31 DIAGNOSIS — Z90722 Acquired absence of ovaries, bilateral: Secondary | ICD-10-CM | POA: Diagnosis not present

## 2023-05-31 DIAGNOSIS — Z8543 Personal history of malignant neoplasm of ovary: Secondary | ICD-10-CM | POA: Insufficient documentation

## 2023-05-31 DIAGNOSIS — Z79810 Long term (current) use of selective estrogen receptor modulators (SERMs): Secondary | ICD-10-CM | POA: Insufficient documentation

## 2023-05-31 DIAGNOSIS — Z95828 Presence of other vascular implants and grafts: Secondary | ICD-10-CM

## 2023-05-31 DIAGNOSIS — C569 Malignant neoplasm of unspecified ovary: Secondary | ICD-10-CM | POA: Diagnosis not present

## 2023-05-31 DIAGNOSIS — Z90710 Acquired absence of both cervix and uterus: Secondary | ICD-10-CM | POA: Diagnosis not present

## 2023-05-31 MED ORDER — HEPARIN SOD (PORK) LOCK FLUSH 100 UNIT/ML IV SOLN
500.0000 [IU] | Freq: Once | INTRAVENOUS | Status: AC | PRN
  Administered 2023-05-31: 500 [IU]

## 2023-05-31 MED ORDER — SODIUM CHLORIDE 0.9% FLUSH
10.0000 mL | INTRAVENOUS | Status: DC | PRN
  Administered 2023-05-31: 10 mL

## 2023-05-31 NOTE — Progress Notes (Signed)
[facility] OFFICE PROGRESS NOTE   Diagnosis: Ovarian cancer  INTERVAL HISTORY:   [name] [name] returns as scheduled.  She continues tamoxifen.  No significant hot flashes.  No bleeding or symptom of thrombosis.  She fell and bruised the left breast recently.  The bruise has resolved.  No abdominal pain or nausea.  No difficulty with bowel function.  She had COVID-19 in late July.  Objective:  Vital signs in last 24 hours:  Blood pressure 122/86, pulse 72, temperature 98.1 F (36.7 C), temperature source Oral, resp. rate 18, height 5\' 4"  (1.626 m), weight 159 lb (72.1 kg), SpO2 99%.   Resp: Lungs clear bilaterally Cardio: Regular rate and rhythm GI: No hepatosplenomegaly, no mass, no apparent ascites Vascular: No leg edema   Portacath/PICC-without erythema  Lab Results:  Lab Results  Component Value Date   WBC 6.8 05/11/2023   HGB 12.5 05/11/2023   HCT 39.1 05/11/2023   MCV 98.5 05/11/2023   PLT 417.0 (H) 05/11/2023   NEUTROABS 3.7 12/14/2022    CMP  Lab Results  Component Value Date   NA 141 05/11/2023   K 4.5 05/11/2023   CL 105 05/11/2023   CO2 28 05/11/2023   GLUCOSE 85 05/11/2023   BUN 17 05/11/2023   CREATININE 0.72 05/11/2023   CALCIUM 9.2 05/11/2023   PROT 6.7 05/11/2023   ALBUMIN 4.1 05/11/2023   AST 27 05/11/2023   ALT 31 05/11/2023   ALKPHOS 39 05/11/2023   BILITOT 0.5 05/11/2023   GFRNONAA >60 05/04/2023   GFRAA >60 03/13/2020    Lab Results  Component Value Date   CEA 0.8 07/13/2012   CA125 8 08/18/2016    Medications: I have reviewed the patient's current medications.   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 A 5 mm nodules remain on the right diaphragm. - TumorNext paired germline/tumor analyses: No somatic variants detected, germline CHEK2      VUS       Cycle 1 of adjuvant Taxol/carboplatin chemotherapy initiated on 09/19/2012.   The CA 125 normalized.   She completed day 15 of cycle 6 on 02/06/2013.   Restaging CT evaluation 03/29/2013 showed no evidence of metastatic disease in the chest. There was marked improvement in appearance/resolution of previous described peritoneal/omental disease. There was no convincing evidence of residual disease. There was minimal increased density in the region of the omentum favored to be treatment-related. There was no pelvic adenopathy. CA125 3.2 on 02/19/2014. 08/18/2016 CA-125 8 CT abdomen/pelvis 08/25/2016-solitary new enlarged right external iliac lymph node measuring 2.2 cm. Biopsy 09/07/2016-adenocarcinoma consistent with high-grade serous carcinoma.  ER +90%, PR -0%; foundation 1-HRD not detected, microsatellite status cannot be determined, tumor mutation burden 8, FOLR1 positive-80% PET scan 09/22/2016-malignant range FDG uptake associated with the enlarged right external iliac lymph node compatible with metastatic adenopathy.  No additional sites of metastatic disease identified. Radiation 10/25/2016-12/01/2016 PET scan 03/28/2017-previously enlarged hypermetabolic right external iliac node-normal in size with resolution of hypermetabolic activity, no active malignancy identified PET scan 05/15/2018-no evidence of recurrent or metastatic disease, no hypermetabolic lymph nodes CT abdomen/pelvis 09/19/2019-mild increase in the size of several left upper quadrant peritoneal nodules measuring up to 11 mm.  No other sites of metastatic disease identified.  No ascites. CT abdomen/pelvis 03/14/2020-slight enlargement of several left upper quadrant peritoneal nodules, no ascites, no other evidence of disease progression CT abdomen/pelvis 09/12/2020 peritoneal nodularity/omental caking  predominantly in the left upper/mid abdomen, mildly progressive.  Largest implant in the left upper abdomen adjacent to the stomach  now measures 17 mm, previously 13 mm.  Overall volume of peritoneal disease has mildly progressed. CT abdomen/pelvis 01/13/2021- 4 mm right ureteral calculus with moderate right hydronephrosis and upper right hydroureter, progressive omental nodularity, stable calcified and partially calcified right pelvic nodules CT abdomen/pelvis 05/13/2021-enlargement of left upper quadrant omental nodules and a peritoneal nodule at the right iliac fossa, no ascites CT abdomen/pelvis 08/14/2021-mild increase in size of omental nodules, no ascites, no new site of metastatic disease Taxol/carboplatin 09/09/2021, 09/15/2021, 09/22/2021 Taxol/carboplatin 10/07/2021, 10/13/2021, 10/20/2021 Taxol/Carboplatin 11/03/2021, 11/10/2021, 11/17/2021 CT 11/23/2021-decrease in peritoneal metastases, no new or progressive disease, airspace opacity at both lung bases Taxol/carboplatin 12/01/2021, 12/08/2021, 12/15/2021 Taxol/Carboplatin every 2 weeks beginning 12/29/2021 Carboplatin alone 01/26/2022, Taxol held due to neuropathy Carboplatin alone 02/09/2022, Taxol held due to neuropathy Carboplatin alone 02/23/2022, Taxol held due to neuropathy Carboplatin alone 03/09/2022, Taxol held due to neuropathy CTs 03/25/2022-no significant change in omental, mesenteric disease.  No new or clearly progressive findings.  Previously noted patchy areas of consolidative opacity at the lung bases have resolved. Treatment break beginning 03/26/2022 CT/pelvis 08/10/2022-increased size of peritoneal nodules, increased small volume ascites, mild right lower lobe pleural nodularity Cycle 1 salvage carboplatin 09/14/2022 (weekly x 3 followed by 1 week break) Cycle 2 salvage carboplatin 10/12/2022 (weekly x 3 followed by 1 week break) Cycle 3 salvage carboplatin 11/16/2022 (weekly x 3 followed by 1 week break) CT abdomen/pelvis 12/08/2022-mild increase in peritoneal carcinomatosis Tamoxifen 20 mg daily 01/07/2023 CT abdomen/pelvis 03/29/2023-increased ascites with persistent  diffuse peritoneal nodularity, largest omental implants have decreased in size.  No enlarging implants.,  No evidence of bowel obstruction   2. Low abdomen/suprapubic pain prior to the exploratory laparotomy-likely secondary to omental/pelvic tumor; persistent mild pain in the lower abdomen   3. Chronic neck and back pain.   4. Anxiety -persistent despite Lexapro and Xanax. She has been evaluated by psychiatry. Currently on Lexapro. 5. Status post Port-A-Cath placement 09/22/2012. The Port-A-Cath was removed on 04/03/2013.   6. Neutropenia secondary to chemotherapy- day 15 cycle 1 and cycle 3. Taxol/carboplatin not given secondary to neutropenia.    7. Herpes zoster involving a right thoracic dermatome July 2015. She completed a course of Valtrex. 8. Nodular bony prominence at the left pelvis on rectal exam 06/05/2015-likely a benign finding 9.  Right ureter stone/hydroureteronephrosis on CT 01/13/2021-referred to urology; status post right ureteroscopy with stone extraction and ureteral stent placement.  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  Right hip fracture following a fall 11/02/2022-status post right hip anterior hemiarthroplasty 12.  Acute localized thrombus right proximal femoral vein 11/16/2022 Eliquis initiated     Disposition: [name] [name] appears stable.  There is no clinical evidence for progression of the ovarian cancer.  She will continue tamoxifen.  We will follow-up on the CA125 from today.  We will plan for a restaging CT within the next few months.  [name] [name] will return for an office and lab visit on 07/18/2023.  Thornton Papas, MD  05/31/2023  9:14 AM

## 2023-05-31 NOTE — Patient Instructions (Signed)

## 2023-06-01 ENCOUNTER — Telehealth: Payer: Self-pay

## 2023-06-01 LAB — CA 125: Cancer Antigen (CA) 125: 550 U/mL — ABNORMAL HIGH (ref 0.0–38.1)

## 2023-06-01 NOTE — Telephone Encounter (Signed)
Patient gave verbal understanding and had no further questions or concerns  

## 2023-06-01 NOTE — Telephone Encounter (Signed)
-----   Message from Thornton Papas sent at 06/01/2023  3:24 PM EDT ----- Please call patient, CA125 is higher compared to June, but remains a lower compared to May and early June.  Continue tamoxifen, we will schedule CTs if the CA125 is higher at the next visit

## 2023-06-10 ENCOUNTER — Telehealth: Payer: Self-pay

## 2023-06-10 NOTE — Telephone Encounter (Signed)
PT called to reschedule her upcoming appointment with Dr. Pricilla Holm on 06/17/23.  Rescheduled to 10/10 @ 4:00. Pt agreed to date and time

## 2023-06-17 ENCOUNTER — Ambulatory Visit: Payer: Medicare Other | Admitting: Gynecologic Oncology

## 2023-06-20 ENCOUNTER — Encounter: Payer: Self-pay | Admitting: Oncology

## 2023-06-22 ENCOUNTER — Encounter: Payer: Self-pay | Admitting: Emergency Medicine

## 2023-06-22 ENCOUNTER — Other Ambulatory Visit: Payer: Self-pay

## 2023-06-22 ENCOUNTER — Encounter: Payer: Self-pay | Admitting: Oncology

## 2023-06-22 ENCOUNTER — Emergency Department
Admission: EM | Admit: 2023-06-22 | Discharge: 2023-06-23 | Disposition: A | Discharge status: Home / Self Care | Payer: Medicare Other | Source: Home / Self Care | Attending: Emergency Medicine | Admitting: Emergency Medicine

## 2023-06-22 ENCOUNTER — Ambulatory Visit
Admission: EM | Admit: 2023-06-22 | Discharge: 2023-06-22 | Disposition: A | Discharge status: Home / Self Care | Payer: Medicare Other

## 2023-06-22 ENCOUNTER — Ambulatory Visit
Admission: RE | Admit: 2023-06-22 | Discharge: 2023-06-22 | Disposition: A | Discharge status: Home / Self Care | Payer: Medicare Other | Source: Ambulatory Visit | Attending: Nurse Practitioner

## 2023-06-22 DIAGNOSIS — R180 Malignant ascites: Secondary | ICD-10-CM

## 2023-06-22 DIAGNOSIS — Z933 Colostomy status: Secondary | ICD-10-CM | POA: Diagnosis not present

## 2023-06-22 DIAGNOSIS — M069 Rheumatoid arthritis, unspecified: Secondary | ICD-10-CM | POA: Diagnosis not present

## 2023-06-22 DIAGNOSIS — Z1231 Encounter for screening mammogram for malignant neoplasm of breast: Secondary | ICD-10-CM

## 2023-06-22 DIAGNOSIS — C569 Malignant neoplasm of unspecified ovary: Secondary | ICD-10-CM | POA: Insufficient documentation

## 2023-06-22 DIAGNOSIS — R188 Other ascites: Secondary | ICD-10-CM | POA: Diagnosis not present

## 2023-06-22 DIAGNOSIS — R0609 Other forms of dyspnea: Secondary | ICD-10-CM | POA: Diagnosis not present

## 2023-06-22 DIAGNOSIS — I341 Nonrheumatic mitral (valve) prolapse: Secondary | ICD-10-CM | POA: Diagnosis not present

## 2023-06-22 DIAGNOSIS — Z90710 Acquired absence of both cervix and uterus: Secondary | ICD-10-CM | POA: Diagnosis not present

## 2023-06-22 DIAGNOSIS — E785 Hyperlipidemia, unspecified: Secondary | ICD-10-CM | POA: Diagnosis not present

## 2023-06-22 DIAGNOSIS — Z8543 Personal history of malignant neoplasm of ovary: Secondary | ICD-10-CM | POA: Insufficient documentation

## 2023-06-22 DIAGNOSIS — Z7901 Long term (current) use of anticoagulants: Secondary | ICD-10-CM | POA: Insufficient documentation

## 2023-06-22 DIAGNOSIS — Z923 Personal history of irradiation: Secondary | ICD-10-CM | POA: Diagnosis not present

## 2023-06-22 DIAGNOSIS — Z9079 Acquired absence of other genital organ(s): Secondary | ICD-10-CM | POA: Diagnosis not present

## 2023-06-22 DIAGNOSIS — R0600 Dyspnea, unspecified: Secondary | ICD-10-CM | POA: Insufficient documentation

## 2023-06-22 DIAGNOSIS — R600 Localized edema: Secondary | ICD-10-CM | POA: Diagnosis not present

## 2023-06-22 DIAGNOSIS — Z86718 Personal history of other venous thrombosis and embolism: Secondary | ICD-10-CM | POA: Diagnosis not present

## 2023-06-22 DIAGNOSIS — R2243 Localized swelling, mass and lump, lower limb, bilateral: Secondary | ICD-10-CM | POA: Insufficient documentation

## 2023-06-22 DIAGNOSIS — G8929 Other chronic pain: Secondary | ICD-10-CM | POA: Diagnosis not present

## 2023-06-22 DIAGNOSIS — Z8249 Family history of ischemic heart disease and other diseases of the circulatory system: Secondary | ICD-10-CM | POA: Diagnosis not present

## 2023-06-22 DIAGNOSIS — M7989 Other specified soft tissue disorders: Secondary | ICD-10-CM | POA: Diagnosis not present

## 2023-06-22 DIAGNOSIS — E7800 Pure hypercholesterolemia, unspecified: Secondary | ICD-10-CM | POA: Diagnosis not present

## 2023-06-22 DIAGNOSIS — Z90722 Acquired absence of ovaries, bilateral: Secondary | ICD-10-CM | POA: Diagnosis not present

## 2023-06-22 DIAGNOSIS — C786 Secondary malignant neoplasm of retroperitoneum and peritoneum: Secondary | ICD-10-CM | POA: Diagnosis not present

## 2023-06-22 DIAGNOSIS — J9811 Atelectasis: Secondary | ICD-10-CM | POA: Diagnosis not present

## 2023-06-22 DIAGNOSIS — I871 Compression of vein: Secondary | ICD-10-CM | POA: Diagnosis not present

## 2023-06-22 DIAGNOSIS — M542 Cervicalgia: Secondary | ICD-10-CM | POA: Diagnosis not present

## 2023-06-22 DIAGNOSIS — F32A Depression, unspecified: Secondary | ICD-10-CM | POA: Diagnosis not present

## 2023-06-22 DIAGNOSIS — E8770 Fluid overload, unspecified: Secondary | ICD-10-CM | POA: Diagnosis not present

## 2023-06-22 DIAGNOSIS — Z452 Encounter for adjustment and management of vascular access device: Secondary | ICD-10-CM | POA: Diagnosis not present

## 2023-06-22 DIAGNOSIS — K449 Diaphragmatic hernia without obstruction or gangrene: Secondary | ICD-10-CM | POA: Diagnosis not present

## 2023-06-22 DIAGNOSIS — I700 Atherosclerosis of aorta: Secondary | ICD-10-CM | POA: Diagnosis not present

## 2023-06-22 DIAGNOSIS — F419 Anxiety disorder, unspecified: Secondary | ICD-10-CM | POA: Diagnosis not present

## 2023-06-22 DIAGNOSIS — Z79899 Other long term (current) drug therapy: Secondary | ICD-10-CM | POA: Insufficient documentation

## 2023-06-22 DIAGNOSIS — R0602 Shortness of breath: Secondary | ICD-10-CM | POA: Diagnosis not present

## 2023-06-22 DIAGNOSIS — I5031 Acute diastolic (congestive) heart failure: Secondary | ICD-10-CM | POA: Diagnosis not present

## 2023-06-22 DIAGNOSIS — R079 Chest pain, unspecified: Secondary | ICD-10-CM | POA: Diagnosis not present

## 2023-06-22 DIAGNOSIS — Z96641 Presence of right artificial hip joint: Secondary | ICD-10-CM | POA: Diagnosis present

## 2023-06-22 LAB — CBC WITH DIFFERENTIAL/PLATELET
Abs Immature Granulocytes: 0.01 10*3/uL (ref 0.00–0.07)
Basophils Absolute: 0.1 10*3/uL (ref 0.0–0.1)
Basophils Relative: 1 %
Eosinophils Absolute: 0.1 10*3/uL (ref 0.0–0.5)
Eosinophils Relative: 1 %
HCT: 43 % (ref 36.0–46.0)
Hemoglobin: 13.9 g/dL (ref 12.0–15.0)
Immature Granulocytes: 0 %
Lymphocytes Relative: 14 %
Lymphs Abs: 1.1 10*3/uL (ref 0.7–4.0)
MCH: 32 pg (ref 26.0–34.0)
MCHC: 32.3 g/dL (ref 30.0–36.0)
MCV: 99.1 fL (ref 80.0–100.0)
Monocytes Absolute: 0.8 10*3/uL (ref 0.1–1.0)
Monocytes Relative: 10 %
Neutro Abs: 6 10*3/uL (ref 1.7–7.7)
Neutrophils Relative %: 74 %
Platelets: 306 10*3/uL (ref 150–400)
RBC: 4.34 MIL/uL (ref 3.87–5.11)
RDW: 14.1 % (ref 11.5–15.5)
WBC: 8 10*3/uL (ref 4.0–10.5)
nRBC: 0 % (ref 0.0–0.2)

## 2023-06-22 LAB — COMPREHENSIVE METABOLIC PANEL WITH GFR
ALT: 22 U/L (ref 0–44)
AST: 27 U/L (ref 15–41)
Albumin: 3.6 g/dL (ref 3.5–5.0)
Alkaline Phosphatase: 34 U/L — ABNORMAL LOW (ref 38–126)
Anion gap: 10 (ref 5–15)
BUN: 25 mg/dL — ABNORMAL HIGH (ref 8–23)
CO2: 25 mmol/L (ref 22–32)
Calcium: 8.9 mg/dL (ref 8.9–10.3)
Chloride: 104 mmol/L (ref 98–111)
Creatinine, Ser: 0.71 mg/dL (ref 0.44–1.00)
GFR, Estimated: >60 mL/min (ref >60)
Glucose, Bld: 113 mg/dL — ABNORMAL HIGH (ref 70–99)
Potassium: 4.5 mmol/L (ref 3.5–5.1)
Sodium: 139 mmol/L (ref 135–145)
Total Bilirubin: 0.6 mg/dL (ref 0.3–1.2)
Total Protein: 6.7 g/dL (ref 6.5–8.1)

## 2023-06-22 NOTE — ED Triage Notes (Signed)
Pt presents ambulatory to triage via POV with complaints of bilateral leg swelling x 1 month. Pt states that the swelling is mostly localized in her feet but has some swelling up in her legs. No redness and warmth noted in the area. A&Ox4 at this time. Denies CP or SOB.

## 2023-06-22 NOTE — ED Notes (Signed)
Patient is being discharged from the Urgent Care and sent to the Emergency Department via POV. Per R. Myer, PA, patient is in need of higher level of care due to feet swelling. Patient is aware and verbalizes understanding of plan of care.  Vitals:   06/22/23 1830  BP: 127/81  Pulse: 90  Resp: 18  Temp: 98.1 F (36.7 C)  SpO2: 96%

## 2023-06-22 NOTE — ED Provider Notes (Signed)
Patient here today for evaluation of bilateral foot and leg swelling that is worsened over the last few days.  She states she has had some for the last few months but not as significant as it has become.  She reports pain with walking.  She does have history of blood clots but is also on blood thinning medication at this time.  She does have history of cancer.  She has recent history of femur fracture and hip dislocation.  I recommended further evaluation in the emergency room as I suspect she will need stat labs and imaging, specifically ultrasound to rule out DVT given risk factors.  Patient is agreeable to same, is stable to transport via POV and we will report to [facility] after leaving urgent care office.   Tomi Bamberger, PA-C 06/22/23 1900

## 2023-06-22 NOTE — ED Triage Notes (Signed)
"  My feet are swelling and this is just getting worse for the last few months". "I have a history of blood clots and on blood thinners".

## 2023-06-22 NOTE — ED Notes (Signed)
Pt left for imaging.

## 2023-06-22 NOTE — ED Provider Notes (Signed)
[facility] Provider Note    Event Date/Time   First MD Initiated Contact with Patient 06/22/23 2327     (approximate)   History   Leg Swelling   HPI  [name] [name] is a 72 y.o. female with history of DVT, ovarian cancer who presents to the emergency department with bilateral leg swelling worse on the left than the right that has been present for about 2 to 3 months and progressively worsening.  She also has shortness of breath with exertion but no chest pain.  No known history of CHF and cannot recall if she has ever had an echocardiogram.  States her daughter urged her to come to the emergency department today.   History provided by patient.    Past Medical History:  Diagnosis Date   Anxiety and depression     DJD (degenerative joint disease)    Elevated cholesterol    Endometrial polyp    Family history of bladder cancer    Family history of lymphoma    History of radiation therapy 10/25/16-12/01/16   right pelvis/60.2 Gy in 28 fractions   IRRITABLE BOWEL SYNDROME, HX OF 05/15/2008   Kidney stone    MVP (mitral valve prolapse)    occ palpitations    Ovarian ca (HCC) 07/2012       Rheumatoid arthritis(714.0) ~ 2010   dr Dareen Piano   Shingles     Past Surgical History:  Procedure Laterality Date   ABDOMINAL HYSTERECTOMY  08/22/2012   Procedure: HYSTERECTOMY ABDOMINAL;  Surgeon: Jeannette Corpus, MD;  Location: [facility];  Service: Gynecology;  Laterality: N/A;   ANTERIOR APPROACH HEMI HIP ARTHROPLASTY Right 11/03/2022   Procedure: ANTERIOR APPROACH HEMI HIP ARTHROPLASTY;  Surgeon: Lyndle Herrlich, MD;  Location: [facility];  Service: Orthopedics;  Laterality: Right;   BACK SURGERY  10/04/2006   Dr Dutch Quint   COLONOSCOPY  04/20/2004   COLONOSCOPY W/ BIOPSIES  07/04/2014   COLOSTOMY REVISION  08/22/2012   Procedure: COLON RESECTION SIGMOID;  Surgeon: Jeannette Corpus, MD;  Location: [facility];  Service: Gynecology;;  Rectal Sigmoid  resection and low rectal anastomosis   CYST ON NECK  10/04/2009   DEBULKING  08/22/2012   Procedure: DEBULKING;  Surgeon: Jeannette Corpus, MD;  Location: [facility];  Service: Gynecology;  Laterality: N/A;  Radical tumor debulking, Bilateral Ureterolysis   DILATION AND CURETTAGE OF UTERUS  10/04/2002   WITH HYSTEROSCOPY   HAND SURGERY  10/04/1994   HYSTEROSCOPY  10/04/2002   D&C   IR IMAGING GUIDED PORT INSERTION  09/01/2021   KIDNEY STONE SURGERY Right    with stent placement  Mar 03 2021   NECK SURGERY  10/05/2007   SPURS   OMENTECTOMY  08/22/2012   Procedure: OMENTECTOMY;  Surgeon: Jeannette Corpus, MD;  Location: [facility];  Service: Gynecology;  Laterality: N/A;   SALPINGOOPHORECTOMY  08/22/2012   Procedure: SALPINGO OOPHORECTOMY;  Surgeon: Jeannette Corpus, MD;  Location: [facility];  Service: Gynecology;  Laterality: Bilateral;   TUBAL LIGATION  10/04/1978    MEDICATIONS:  Prior to Admission medications   Medication Sig Start Date End Date Taking? Authorizing Provider  apixaban (ELIQUIS) 5 MG TABS tablet Take 1 tablet (5 mg total) by mouth 2 (two) times daily. 01/10/23   [name], MD  benzonatate (TESSALON PERLES) 100 MG capsule Take 1 capsule (100 mg total) by mouth 3 (three) times daily as needed for cough. Patient not taking: Reported on 05/31/2023 05/04/23  05/03/24  Corena Herter, MD  fish oil-omega-3 fatty acids 1000 MG capsule Take 1 g by mouth daily.    [provider]  HYDROcodone-acetaminophen (NORCO) 7.5-325 MG tablet Take 1 tablet by mouth every 4 (four) hours as needed for severe pain (pain score 7-10). Patient not taking: Reported on 05/31/2023 11/04/22   Altamese Cabal, PA-C  Multiple Vitamin (MULTIVITAMIN WITH MINERALS) TABS Take 1 tablet by mouth daily.    [provider]  prochlorperazine (COMPAZINE) 10 MG tablet Take 10 mg by mouth every 6 (six) hours as needed for nausea or vomiting. Patient not taking: Reported on 05/11/2023     [provider]  Red Yeast Rice Extract 600 MG CAPS Take 1 capsule by mouth daily.    [provider]  rosuvastatin (CRESTOR) 5 MG tablet Take 1 tablet (5 mg total) by mouth daily. 05/19/23   Eden Emms, NP  tamoxifen (NOLVADEX) 20 MG tablet Take 1 tablet (20 mg total) by mouth daily. 04/01/23   [name], MD  Vitamin D, Ergocalciferol, (DRISDOL) 1.25 MG (50000 UNIT) CAPS capsule Take 1 capsule (50,000 Units total) by mouth every 7 (seven) days. 05/13/23   Eden Emms, NP    Physical Exam   Triage Vital Signs: ED Triage Vitals  Encounter Vitals Group     BP 06/22/23 2011 (!) 147/86     Systolic BP Percentile --      Diastolic BP Percentile --      Pulse Rate 06/22/23 2011 91     Resp 06/22/23 2011 18     Temp 06/22/23 2011 98.8 F (37.1 C)     Temp Source 06/22/23 2011 Oral     SpO2 06/22/23 2011 98 %     Weight 06/22/23 2010 160 lb (72.6 kg)     Height 06/22/23 2010 5\' 4"  (1.626 m)     Head Circumference --      Peak Flow --      Pain Score 06/22/23 2010 2     Pain Loc --      Pain Education --      Exclude from Growth Chart --     Most recent vital signs: Vitals:   06/22/23 2011 06/23/23 0146  BP: (!) 147/86 129/82  Pulse: 91 88  Resp: 18 16  Temp: 98.8 F (37.1 C) 98.7 F (37.1 C)  SpO2: 98% 98%    CONSTITUTIONAL: Alert, responds appropriately to questions. Well-appearing; well-nourished HEAD: Normocephalic, atraumatic EYES: Conjunctivae clear, pupils appear equal, sclera nonicteric ENT: normal nose; moist mucous membranes NECK: Supple, normal ROM CARD: RRR; S1 and S2 appreciated RESP: Normal chest excursion without splinting or tachypnea; breath sounds clear and equal bilaterally; no wheezes, no rhonchi, no rales, no hypoxia or respiratory distress, speaking full sentences ABD/GI: Non-distended; soft, non-tender, no rebound, no guarding, no peritoneal signs BACK: The back appears normal EXT: Normal ROM in all joints; no deformity  noted, pitting edema to bilateral distal lower extremities, left leg appears more swollen than the right, no calf tenderness present, extremities warm well-perfused, compartments soft SKIN: Normal color for age and race; warm; no rash on exposed skin NEURO: Moves all extremities equally, normal speech PSYCH: The patient's mood and manner are appropriate.   ED Results / Procedures / Treatments   LABS: (all labs ordered are listed, but only abnormal results are displayed) Labs Reviewed  COMPREHENSIVE METABOLIC PANEL - Abnormal; Notable for the following components:      Result Value   Glucose,  Bld 113 (*)    BUN 25 (*)    Alkaline Phosphatase 34 (*)    All other components within normal limits  URINALYSIS, ROUTINE W REFLEX MICROSCOPIC - Abnormal; Notable for the following components:   Color, Urine YELLOW (*)    APPearance HAZY (*)    Ketones, ur 20 (*)    All other components within normal limits  CBC WITH DIFFERENTIAL/PLATELET  BRAIN NATRIURETIC PEPTIDE  TROPONIN I (HIGH SENSITIVITY)  TROPONIN I (HIGH SENSITIVITY)     EKG:  EKG Interpretation Date/Time:  Thursday June 23 2023 00:45:20 EDT Ventricular Rate:  80 PR Interval:  146 QRS Duration:  82 QT Interval:  398 QTC Calculation: 459 R Axis:   -24  Text Interpretation: Normal sinus rhythm Anterolateral infarct (cited on or before 02-Nov-2022) Abnormal ECG When compared with ECG of 04-May-2023 10:02, Left anterior fascicular block is no longer Present Confirmed by Rochele Raring [phone]) on 06/23/2023 12:52:23 AM         RADIOLOGY: My personal review and interpretation of imaging: CT shows no PE.  Doppler shows no DVT.  I have personally reviewed all radiology reports.   CT Angio Chest PE W and/or Wo Contrast  Result Date: 06/23/2023 CLINICAL DATA:  Lower extremity pain and swelling with chest pain, history of ovarian carcinoma, initial encounter EXAM: CT ANGIOGRAPHY CHEST WITH CONTRAST TECHNIQUE: Multidetector CT  imaging of the chest was performed using the standard protocol during bolus administration of intravenous contrast. Multiplanar CT image reconstructions and MIPs were obtained to evaluate the vascular anatomy. RADIATION DOSE REDUCTION: This exam was performed according to the departmental dose-optimization program which includes automated exposure control, adjustment of the mA and/or kV according to patient size and/or use of iterative reconstruction technique. CONTRAST:  75mL OMNIPAQUE IOHEXOL 350 MG/ML SOLN COMPARISON:  05/04/2023, 03/29/2023 FINDINGS: Cardiovascular: Thoracic aorta and its branches demonstrate atherosclerotic calcifications. No aneurysmal dilatation or dissection is seen. Heart is not significantly enlarged in size. The pulmonary artery shows a normal branching pattern bilaterally. No intraluminal filling defect is identified. No significant coronary calcifications are noted. Mediastinum/Nodes: Thoracic inlet is within normal limits. No hilar or mediastinal adenopathy is noted. The esophagus as visualized is within normal limits. Moderate hiatal hernia is seen. Lungs/Pleura: Mild atelectatic changes are noted in the bases bilaterally. No focal confluent infiltrate is seen. No parenchymal nodules are noted. Upper Abdomen: Visualized upper abdomen demonstrates significant ascites as well as peritoneal implants consistent with the patient's known history of ovarian carcinoma and peritoneal carcinomatosis. No other upper abdominal abnormality is seen. Musculoskeletal: Degenerative changes of the thoracic spine are noted. Review of the MIP images confirms the above findings. IMPRESSION: No evidence of pulmonary emboli. Basilar atelectatic changes. Ascites and peritoneal implants consistent with the patient's known history of ovarian carcinoma and peritoneal carcinomatosis. The overall appearance is similar to that seen on 03/29/23 Aortic Atherosclerosis (ICD10-I70.0). Electronically Signed   By: Alcide Clever M.D.   On: 06/23/2023 01:19   US Venous Img Lower Bilateral (DVT)  Result Date: 06/23/2023 CLINICAL DATA:  [id number] with swelling in the legs. EXAM: BILATERAL LOWER EXTREMITY VENOUS DOPPLER ULTRASOUND TECHNIQUE: Gray-scale sonography with graded compression, as well as color Doppler and duplex ultrasound were performed to evaluate the lower extremity deep venous systems from the level of the common femoral vein and including the common femoral, femoral, profunda femoral, popliteal and calf veins including the posterior tibial, peroneal and gastrocnemius veins when visible. The superficial great saphenous vein was also interrogated. Spectral Doppler  was utilized to evaluate flow at rest and with distal augmentation maneuvers in the common femoral, femoral and popliteal veins. COMPARISON:  Study of 11/27/2022 FINDINGS: RIGHT LOWER EXTREMITY Common Femoral Vein: No evidence of thrombus. Normal compressibility, respiratory phasicity and response to augmentation. Saphenofemoral Junction: No evidence of thrombus. Normal compressibility and flow on color Doppler imaging. Profunda Femoral Vein: No evidence of thrombus. Normal compressibility and flow on color Doppler imaging. Femoral Vein: No evidence of thrombus. Normal compressibility, respiratory phasicity and response to augmentation. Popliteal Vein: No evidence of thrombus. Normal compressibility, respiratory phasicity and response to augmentation. Calf Veins: No evidence of thrombus. Normal compressibility and flow on color Doppler imaging. Superficial Great Saphenous Vein: No evidence of thrombus. Normal compressibility. Venous Reflux:  None. Other Findings: There is soft tissue edema in the popliteal fossa and foreleg. LEFT LOWER EXTREMITY Common Femoral Vein: No evidence of thrombus. Normal compressibility, respiratory phasicity and response to augmentation. Saphenofemoral Junction: No evidence of thrombus. Normal compressibility and flow on color Doppler  imaging. Profunda Femoral Vein: No evidence of thrombus. Normal compressibility and flow on color Doppler imaging. Femoral Vein: No evidence of thrombus. Normal compressibility, respiratory phasicity and response to augmentation. Popliteal Vein: No evidence of thrombus. Normal compressibility, respiratory phasicity and response to augmentation. Calf Veins: No evidence of thrombus. Normal compressibility and flow on color Doppler imaging. Superficial Great Saphenous Vein: No evidence of thrombus. Normal compressibility. Venous Reflux:  None. Other Findings: There is soft tissue edema in the popliteal fossa and foreleg. IMPRESSION: No evidence of deep venous thrombosis in either lower extremity. Bilateral soft tissue edema in the popliteal fossae and calves. Electronically Signed   By: Almira Bar M.D.   On: 06/23/2023 00:40     PROCEDURES:  Critical Care performed: No   CRITICAL CARE Performed by: Baxter Hire Edell Mesenbrink   Total critical care time: 0 minutes  Critical care time was exclusive of separately billable procedures and treating other patients.  Critical care was necessary to treat or prevent imminent or life-threatening deterioration.  Critical care was time spent personally by me on the following activities: development of treatment plan with patient and/or surrogate as well as nursing, discussions with consultants, evaluation of patient's response to treatment, examination of patient, obtaining history from patient or surrogate, ordering and performing treatments and interventions, ordering and review of laboratory studies, ordering and review of radiographic studies, pulse oximetry and re-evaluation of patient's condition.   Procedures    IMPRESSION / MDM / ASSESSMENT AND PLAN / ED COURSE  I reviewed the triage vital signs and the nursing notes.    Patient here with lower extremity swelling and shortness of breath with exertion.     DIFFERENTIAL DIAGNOSIS (includes but not  limited to):   CHF, nephrotic syndrome, vascular insufficiency, PE, ACS   Patient's presentation is most consistent with acute presentation with potential threat to life or bodily function.   PLAN: Patient's labs show no leukocytosis.  Normal creatinine and LFTs.  Will add on troponin x 2, BNP.  Will obtain venous Dopplers, CTA of the chest.  EKG nonischemic.   MEDICATIONS GIVEN IN ED: Medications  iohexol (OMNIPAQUE) 350 MG/ML injection 75 mL (75 mLs Intravenous Contrast Given 06/23/23 0056)     ED COURSE: Troponin x 2 negative.  BNP is normal.  CTA of the chest reviewed and interpreted by myself and the radiologist and shows no PE or pulmonary edema but does show ascites consistent with her known history of ovarian cancer.  Offered a  dose of IV Lasix here but patient declined as she does not want to be up all night urinating.  Will discharge on 20 mg Lasix tablets once daily for the next week.  Recommended low-salt diet, compression stockings and keeping her legs elevated at rest.  She has a PCP for close follow-up.  Have also placed a referral for cardiology evaluation and echocardiogram given shortness of breath with exertion and peripheral edema.  Patient comfortable with plan for discharge home.   At this time, I do not feel there is any life-threatening condition present. I reviewed all nursing notes, vitals, pertinent previous records.  All lab and urine results, EKGs, imaging ordered have been independently reviewed and interpreted by myself.  I reviewed all available radiology reports from any imaging ordered this visit.  Based on my assessment, I feel the patient is safe to be discharged home without further emergent workup and can continue workup as an outpatient as needed. Discussed all findings, treatment plan as well as usual and customary return precautions.  They verbalize understanding and are comfortable with this plan.  Outpatient follow-up has been provided as needed.  All  questions have been answered.    CONSULTS:  none   OUTSIDE RECORDS REVIEWED: Reviewed last PCP note on 05/11/2023.       FINAL CLINICAL IMPRESSION(S) / ED DIAGNOSES   Final diagnoses:  Bilateral lower extremity edema  Dyspnea on exertion  Malignant ascites     Rx / DC Orders   ED Discharge Orders          Ordered    Ambulatory referral to Cardiology       Comments: If you have not heard from the Cardiology office within the next 72 hours please call [phone].   06/23/23 0215    furosemide (LASIX) 20 MG tablet  Daily        06/23/23 0217             Note:  This document was prepared using Dragon voice recognition software and may include unintentional dictation errors.   Salif Tay, Layla Maw, DO 06/23/23 0222

## 2023-06-23 ENCOUNTER — Emergency Department: Payer: Medicare Other

## 2023-06-23 DIAGNOSIS — M7989 Other specified soft tissue disorders: Secondary | ICD-10-CM | POA: Diagnosis not present

## 2023-06-23 DIAGNOSIS — J9811 Atelectasis: Secondary | ICD-10-CM | POA: Diagnosis not present

## 2023-06-23 DIAGNOSIS — R079 Chest pain, unspecified: Secondary | ICD-10-CM | POA: Diagnosis not present

## 2023-06-23 DIAGNOSIS — C569 Malignant neoplasm of unspecified ovary: Secondary | ICD-10-CM | POA: Diagnosis not present

## 2023-06-23 DIAGNOSIS — R188 Other ascites: Secondary | ICD-10-CM | POA: Diagnosis not present

## 2023-06-23 LAB — URINALYSIS, ROUTINE W REFLEX MICROSCOPIC
Bilirubin Urine: NEGATIVE
Glucose, UA: NEGATIVE mg/dL
Hgb urine dipstick: NEGATIVE
Ketones, ur: 20 mg/dL — AB
Leukocytes,Ua: NEGATIVE
Nitrite: NEGATIVE
Protein, ur: NEGATIVE mg/dL
Specific Gravity, Urine: 1.029 (ref 1.005–1.030)
pH: 5 (ref 5.0–8.0)

## 2023-06-23 LAB — TROPONIN I (HIGH SENSITIVITY)
Troponin I (High Sensitivity): 3 ng/L (ref <18)
Troponin I (High Sensitivity): 3 ng/L (ref <18)

## 2023-06-23 LAB — BRAIN NATRIURETIC PEPTIDE: B Natriuretic Peptide: 40 pg/mL (ref 0.0–100.0)

## 2023-06-23 MED ORDER — IOHEXOL 350 MG/ML SOLN
75.0000 mL | Freq: Once | INTRAVENOUS | Status: AC | PRN
  Administered 2023-06-23: 75 mL via INTRAVENOUS

## 2023-06-23 MED ORDER — FUROSEMIDE 20 MG PO TABS
20.0000 mg | ORAL_TABLET | Freq: Every day | ORAL | 0 refills | Status: DC
Start: 2023-06-23 — End: 2023-06-26

## 2023-06-23 NOTE — ED Notes (Signed)
Pt returned from imaging.

## 2023-06-23 NOTE — Discharge Instructions (Addendum)
I recommend limiting the salt/sodium in your diet.  I recommend using compression stockings and keeping your legs elevated when at rest to help with the leg swelling.  Ultrasound of your legs today showed no blood clots.  CT of your chest showed no blood clots or fluid on your lungs but you do have fluid in your abdomen called ascites which is from your ovarian cancer.  Please take your Lasix as prescribed for the next week and this may help with getting some of the fluid off your legs but I recommend close follow-up with your PCP for this.  We have also placed a referral for cardiology to schedule you for an echocardiogram which is an ultrasound of your heart to evaluate for possible heart failure.

## 2023-06-24 ENCOUNTER — Observation Stay
Admission: EM | Admit: 2023-06-24 | Discharge: 2023-06-26 | Disposition: A | Discharge status: Home / Self Care | Payer: Medicare Other | Attending: Internal Medicine | Admitting: Internal Medicine

## 2023-06-24 ENCOUNTER — Ambulatory Visit: Payer: Medicare Other | Admitting: Podiatry

## 2023-06-24 ENCOUNTER — Other Ambulatory Visit: Payer: Self-pay

## 2023-06-24 ENCOUNTER — Emergency Department: Payer: Medicare Other

## 2023-06-24 DIAGNOSIS — R0602 Shortness of breath: Secondary | ICD-10-CM | POA: Diagnosis not present

## 2023-06-24 DIAGNOSIS — Z8249 Family history of ischemic heart disease and other diseases of the circulatory system: Secondary | ICD-10-CM

## 2023-06-24 DIAGNOSIS — I700 Atherosclerosis of aorta: Secondary | ICD-10-CM | POA: Insufficient documentation

## 2023-06-24 DIAGNOSIS — K449 Diaphragmatic hernia without obstruction or gangrene: Secondary | ICD-10-CM | POA: Diagnosis present

## 2023-06-24 DIAGNOSIS — Z635 Disruption of family by separation and divorce: Secondary | ICD-10-CM

## 2023-06-24 DIAGNOSIS — I871 Compression of vein: Secondary | ICD-10-CM | POA: Diagnosis present

## 2023-06-24 DIAGNOSIS — M542 Cervicalgia: Secondary | ICD-10-CM | POA: Diagnosis present

## 2023-06-24 DIAGNOSIS — R180 Malignant ascites: Secondary | ICD-10-CM | POA: Diagnosis present

## 2023-06-24 DIAGNOSIS — Z883 Allergy status to other anti-infective agents status: Secondary | ICD-10-CM

## 2023-06-24 DIAGNOSIS — C569 Malignant neoplasm of unspecified ovary: Secondary | ICD-10-CM | POA: Diagnosis present

## 2023-06-24 DIAGNOSIS — Z9079 Acquired absence of other genital organ(s): Secondary | ICD-10-CM | POA: Diagnosis not present

## 2023-06-24 DIAGNOSIS — Z8543 Personal history of malignant neoplasm of ovary: Secondary | ICD-10-CM | POA: Diagnosis not present

## 2023-06-24 DIAGNOSIS — F419 Anxiety disorder, unspecified: Secondary | ICD-10-CM | POA: Diagnosis present

## 2023-06-24 DIAGNOSIS — Z933 Colostomy status: Secondary | ICD-10-CM

## 2023-06-24 DIAGNOSIS — C786 Secondary malignant neoplasm of retroperitoneum and peritoneum: Secondary | ICD-10-CM | POA: Diagnosis present

## 2023-06-24 DIAGNOSIS — R0609 Other forms of dyspnea: Secondary | ICD-10-CM | POA: Diagnosis not present

## 2023-06-24 DIAGNOSIS — Z86718 Personal history of other venous thrombosis and embolism: Secondary | ICD-10-CM

## 2023-06-24 DIAGNOSIS — Z79899 Other long term (current) drug therapy: Secondary | ICD-10-CM | POA: Diagnosis not present

## 2023-06-24 DIAGNOSIS — M069 Rheumatoid arthritis, unspecified: Secondary | ICD-10-CM | POA: Diagnosis present

## 2023-06-24 DIAGNOSIS — Z79810 Long term (current) use of selective estrogen receptor modulators (SERMs): Secondary | ICD-10-CM

## 2023-06-24 DIAGNOSIS — F32A Depression, unspecified: Secondary | ICD-10-CM | POA: Diagnosis present

## 2023-06-24 DIAGNOSIS — E7800 Pure hypercholesterolemia, unspecified: Secondary | ICD-10-CM | POA: Diagnosis present

## 2023-06-24 DIAGNOSIS — E785 Hyperlipidemia, unspecified: Secondary | ICD-10-CM

## 2023-06-24 DIAGNOSIS — G8929 Other chronic pain: Secondary | ICD-10-CM | POA: Diagnosis present

## 2023-06-24 DIAGNOSIS — E8770 Fluid overload, unspecified: Secondary | ICD-10-CM | POA: Diagnosis not present

## 2023-06-24 DIAGNOSIS — J9811 Atelectasis: Secondary | ICD-10-CM | POA: Diagnosis present

## 2023-06-24 DIAGNOSIS — Z90722 Acquired absence of ovaries, bilateral: Secondary | ICD-10-CM | POA: Diagnosis not present

## 2023-06-24 DIAGNOSIS — Z923 Personal history of irradiation: Secondary | ICD-10-CM | POA: Diagnosis not present

## 2023-06-24 DIAGNOSIS — Z90710 Acquired absence of both cervix and uterus: Secondary | ICD-10-CM

## 2023-06-24 DIAGNOSIS — Z7901 Long term (current) use of anticoagulants: Secondary | ICD-10-CM

## 2023-06-24 DIAGNOSIS — R000 Tachycardia, unspecified: Secondary | ICD-10-CM | POA: Diagnosis present

## 2023-06-24 DIAGNOSIS — M7989 Other specified soft tissue disorders: Secondary | ICD-10-CM | POA: Diagnosis present

## 2023-06-24 DIAGNOSIS — I341 Nonrheumatic mitral (valve) prolapse: Secondary | ICD-10-CM | POA: Diagnosis present

## 2023-06-24 DIAGNOSIS — Z87442 Personal history of urinary calculi: Secondary | ICD-10-CM

## 2023-06-24 DIAGNOSIS — I5031 Acute diastolic (congestive) heart failure: Secondary | ICD-10-CM | POA: Diagnosis present

## 2023-06-24 DIAGNOSIS — M8580 Other specified disorders of bone density and structure, unspecified site: Secondary | ICD-10-CM | POA: Diagnosis present

## 2023-06-24 DIAGNOSIS — Z96641 Presence of right artificial hip joint: Secondary | ICD-10-CM | POA: Diagnosis present

## 2023-06-24 DIAGNOSIS — Z8619 Personal history of other infectious and parasitic diseases: Secondary | ICD-10-CM

## 2023-06-24 DIAGNOSIS — Z888 Allergy status to other drugs, medicaments and biological substances status: Secondary | ICD-10-CM

## 2023-06-24 DIAGNOSIS — Z452 Encounter for adjustment and management of vascular access device: Secondary | ICD-10-CM | POA: Diagnosis not present

## 2023-06-24 HISTORY — DX: Other ascites: R18.8

## 2023-06-24 HISTORY — DX: Localized edema: R60.0

## 2023-06-24 HISTORY — DX: Chest pain, unspecified: R07.9

## 2023-06-24 HISTORY — DX: Acute embolism and thrombosis of unspecified deep veins of right lower extremity: I82.401

## 2023-06-24 LAB — COMPREHENSIVE METABOLIC PANEL
ALT: 20 U/L (ref 0–44)
AST: 28 U/L (ref 15–41)
Albumin: 3.6 g/dL (ref 3.5–5.0)
Alkaline Phosphatase: 34 U/L — ABNORMAL LOW (ref 38–126)
Anion gap: 12 (ref 5–15)
BUN: 28 mg/dL — ABNORMAL HIGH (ref 8–23)
CO2: 23 mmol/L (ref 22–32)
Calcium: 8.9 mg/dL (ref 8.9–10.3)
Chloride: 104 mmol/L (ref 98–111)
Creatinine, Ser: 0.94 mg/dL (ref 0.44–1.00)
GFR, Estimated: >60 mL/min (ref >60)
Glucose, Bld: 122 mg/dL — ABNORMAL HIGH (ref 70–99)
Potassium: 3.7 mmol/L (ref 3.5–5.1)
Sodium: 139 mmol/L (ref 135–145)
Total Bilirubin: 0.5 mg/dL (ref 0.3–1.2)
Total Protein: 7.1 g/dL (ref 6.5–8.1)

## 2023-06-24 LAB — CBC WITH DIFFERENTIAL/PLATELET
Abs Immature Granulocytes: 0.03 10*3/uL (ref 0.00–0.07)
Basophils Absolute: 0.1 10*3/uL (ref 0.0–0.1)
Basophils Relative: 1 %
Eosinophils Absolute: 0.1 10*3/uL (ref 0.0–0.5)
Eosinophils Relative: 1 %
HCT: 41.4 % (ref 36.0–46.0)
Hemoglobin: 13.2 g/dL (ref 12.0–15.0)
Immature Granulocytes: 0 %
Lymphocytes Relative: 13 %
Lymphs Abs: 1.1 10*3/uL (ref 0.7–4.0)
MCH: 31.8 pg (ref 26.0–34.0)
MCHC: 31.9 g/dL (ref 30.0–36.0)
MCV: 99.8 fL (ref 80.0–100.0)
Monocytes Absolute: 0.9 10*3/uL (ref 0.1–1.0)
Monocytes Relative: 10 %
Neutro Abs: 6.5 10*3/uL (ref 1.7–7.7)
Neutrophils Relative %: 75 %
Platelets: 297 10*3/uL (ref 150–400)
RBC: 4.15 MIL/uL (ref 3.87–5.11)
RDW: 13.9 % (ref 11.5–15.5)
WBC: 8.7 10*3/uL (ref 4.0–10.5)
nRBC: 0 % (ref 0.0–0.2)

## 2023-06-24 LAB — BRAIN NATRIURETIC PEPTIDE: B Natriuretic Peptide: 50.2 pg/mL (ref 0.0–100.0)

## 2023-06-24 MED ORDER — ROSUVASTATIN CALCIUM 5 MG PO TABS
5.0000 mg | ORAL_TABLET | Freq: Every day | ORAL | Status: DC
  Administered 2023-06-25 – 2023-06-26 (×2): 5 mg via ORAL
  Filled 2023-06-24 (×2): qty 1

## 2023-06-24 MED ORDER — ONDANSETRON HCL 4 MG PO TABS: 4.0000 mg | ORAL_TABLET | Freq: Four times a day (QID) | ORAL | Status: DC | PRN

## 2023-06-24 MED ORDER — ACETAMINOPHEN 650 MG RE SUPP: 650.0000 mg | Freq: Four times a day (QID) | RECTAL | Status: DC | PRN

## 2023-06-24 MED ORDER — TRAZODONE HCL 50 MG PO TABS: 25.0000 mg | ORAL_TABLET | Freq: Every evening | ORAL | Status: DC | PRN

## 2023-06-24 MED ORDER — ADULT MULTIVITAMIN W/MINERALS CH
1.0000 | ORAL_TABLET | Freq: Every day | ORAL | Status: DC
  Administered 2023-06-25 – 2023-06-26 (×2): 1 via ORAL
  Filled 2023-06-24 (×2): qty 1

## 2023-06-24 MED ORDER — TAMOXIFEN CITRATE 10 MG PO TABS
20.0000 mg | ORAL_TABLET | Freq: Every day | ORAL | Status: DC
  Administered 2023-06-25 – 2023-06-26 (×2): 20 mg via ORAL
  Filled 2023-06-24 (×2): qty 2

## 2023-06-24 MED ORDER — MAGNESIUM HYDROXIDE 400 MG/5ML PO SUSP: 30.0000 mL | Freq: Every day | ORAL | Status: DC | PRN

## 2023-06-24 MED ORDER — APIXABAN 5 MG PO TABS
5.0000 mg | ORAL_TABLET | Freq: Two times a day (BID) | ORAL | Status: DC
  Administered 2023-06-25 – 2023-06-26 (×3): 5 mg via ORAL
  Filled 2023-06-24 (×3): qty 1

## 2023-06-24 MED ORDER — ENOXAPARIN SODIUM 40 MG/0.4ML IJ SOSY: 40.0000 mg | PREFILLED_SYRINGE | INTRAMUSCULAR | Status: DC

## 2023-06-24 MED ORDER — ACETAMINOPHEN 325 MG PO TABS: 650.0000 mg | ORAL_TABLET | Freq: Four times a day (QID) | ORAL | Status: DC | PRN

## 2023-06-24 MED ORDER — FUROSEMIDE 10 MG/ML IJ SOLN
40.0000 mg | Freq: Once | INTRAMUSCULAR | Status: AC
  Administered 2023-06-24: 40 mg via INTRAVENOUS
  Filled 2023-06-24: qty 4

## 2023-06-24 MED ORDER — ONDANSETRON HCL 4 MG/2ML IJ SOLN: 4.0000 mg | Freq: Four times a day (QID) | INTRAMUSCULAR | Status: DC | PRN

## 2023-06-24 MED ORDER — FUROSEMIDE 10 MG/ML IJ SOLN
40.0000 mg | Freq: Two times a day (BID) | INTRAMUSCULAR | Status: DC
  Administered 2023-06-25: 40 mg via INTRAVENOUS
  Filled 2023-06-24: qty 4

## 2023-06-24 MED ORDER — OMEGA-3-ACID ETHYL ESTERS 1 G PO CAPS
1.0000 g | ORAL_CAPSULE | Freq: Every day | ORAL | Status: DC
  Administered 2023-06-25 – 2023-06-26 (×2): 1 g via ORAL
  Filled 2023-06-24 (×2): qty 1

## 2023-06-24 NOTE — H&P (Incomplete)
PATIENT NAME: [name] [name]    MR#:  [id number]  DATE OF BIRTH:  12-10-1950  DATE OF ADMISSION:  06/24/2023  PRIMARY CARE PHYSICIAN: Eden Emms, NP   Patient is coming from: ***  REQUESTING/REFERRING PHYSICIAN: ***  CHIEF COMPLAINT:   Chief Complaint  Patient presents with   Leg Swelling    HISTORY OF PRESENT ILLNESS:  ABRAHAM DIRK is a 72 y.o. female with medical history significant for ***  ED Course: *** EKG as reviewed by me : *** Imaging: *** PAST MEDICAL HISTORY:   Past Medical History:  Diagnosis Date   Anxiety and depression     DJD (degenerative joint disease)    Elevated cholesterol    Endometrial polyp    Family history of bladder cancer    Family history of lymphoma    History of radiation therapy 10/25/16-12/01/16   right pelvis/60.2 Gy in 28 fractions   IRRITABLE BOWEL SYNDROME, HX OF 05/15/2008   Kidney stone    MVP (mitral valve prolapse)    occ palpitations    Ovarian ca (HCC) 07/2012       Rheumatoid arthritis(714.0) ~ 2010   dr Dareen Piano   Shingles     PAST SURGICAL HISTORY:   Past Surgical History:  Procedure Laterality Date   ABDOMINAL HYSTERECTOMY  08/22/2012   Procedure: HYSTERECTOMY ABDOMINAL;  Surgeon: Jeannette Corpus, MD;  Location: [facility];  Service: Gynecology;  Laterality: N/A;   ANTERIOR APPROACH HEMI HIP ARTHROPLASTY Right 11/03/2022   Procedure: ANTERIOR APPROACH HEMI HIP ARTHROPLASTY;  Surgeon: Lyndle Herrlich, MD;  Location: [facility];  Service: Orthopedics;  Laterality: Right;   BACK SURGERY  10/04/2006   Dr Dutch Quint   COLONOSCOPY  04/20/2004   COLONOSCOPY W/ BIOPSIES  07/04/2014   COLOSTOMY REVISION  08/22/2012   Procedure: COLON RESECTION SIGMOID;  Surgeon: Jeannette Corpus, MD;  Location: [facility];  Service: Gynecology;;  Rectal Sigmoid resection and low rectal anastomosis   CYST ON NECK  10/04/2009   DEBULKING  08/22/2012   Procedure: DEBULKING;  Surgeon: Jeannette Corpus,  MD;  Location: [facility];  Service: Gynecology;  Laterality: N/A;  Radical tumor debulking, Bilateral Ureterolysis   DILATION AND CURETTAGE OF UTERUS  10/04/2002   WITH HYSTEROSCOPY   HAND SURGERY  10/04/1994   HYSTEROSCOPY  10/04/2002   D&C   IR IMAGING GUIDED PORT INSERTION  09/01/2021   KIDNEY STONE SURGERY Right    with stent placement  Mar 03 2021   NECK SURGERY  10/05/2007   SPURS   OMENTECTOMY  08/22/2012   Procedure: OMENTECTOMY;  Surgeon: Jeannette Corpus, MD;  Location: [facility];  Service: Gynecology;  Laterality: N/A;   SALPINGOOPHORECTOMY  08/22/2012   Procedure: SALPINGO OOPHORECTOMY;  Surgeon: Jeannette Corpus, MD;  Location: [facility];  Service: Gynecology;  Laterality: Bilateral;   TUBAL LIGATION  10/04/1978    SOCIAL HISTORY:   Social History   Tobacco Use   Smoking status: Never   Smokeless tobacco: Never  Substance Use Topics   Alcohol use: No    FAMILY HISTORY:   Family History  Problem Relation Age of Onset   Heart disease Mother    Bladder Cancer Father 4   Osteoporosis Sister    Lymphoma Paternal Aunt    Colon cancer Neg Hx    Rectal cancer Neg Hx    Stomach cancer Neg Hx    Diabetes Neg Hx    Stroke  Neg Hx    Colon polyps Neg Hx     DRUG ALLERGIES:   Allergies  Allergen Reactions   Atorvastatin Other (See Comments)    Leg pain   Macrodantin Other (See Comments)    Unknown Reaction   Decongest-Aid [Pseudoephedrine] Palpitations    REVIEW OF SYSTEMS:   ROS As per history of present illness. All pertinent systems were reviewed above. Constitutional, HEENT, cardiovascular, respiratory, GI, GU, musculoskeletal, neuro, psychiatric, endocrine, integumentary and hematologic systems were reviewed and are otherwise negative/unremarkable except for positive findings mentioned above in the HPI.   MEDICATIONS AT HOME:   Prior to Admission medications   Medication Sig Start Date End Date Taking? Authorizing Provider  apixaban  (ELIQUIS) 5 MG TABS tablet Take 1 tablet (5 mg total) by mouth 2 (two) times daily. 01/10/23   [name], MD  benzonatate (TESSALON PERLES) 100 MG capsule Take 1 capsule (100 mg total) by mouth 3 (three) times daily as needed for cough. Patient not taking: Reported on 05/31/2023 05/04/23 05/03/24  Corena Herter, MD  fish oil-omega-3 fatty acids 1000 MG capsule Take 1 g by mouth daily.    [provider]  furosemide (LASIX) 20 MG tablet Take 1 tablet (20 mg total) by mouth daily for 7 days. 06/23/23 06/30/23  Ward, Layla Maw, DO  HYDROcodone-acetaminophen (NORCO) 7.5-325 MG tablet Take 1 tablet by mouth every 4 (four) hours as needed for severe pain (pain score 7-10). Patient not taking: Reported on 05/31/2023 11/04/22   Altamese Cabal, PA-C  Multiple Vitamin (MULTIVITAMIN WITH MINERALS) TABS Take 1 tablet by mouth daily.    [provider]  prochlorperazine (COMPAZINE) 10 MG tablet Take 10 mg by mouth every 6 (six) hours as needed for nausea or vomiting. Patient not taking: Reported on 05/11/2023    [provider]  Red Yeast Rice Extract 600 MG CAPS Take 1 capsule by mouth daily.    [provider]  rosuvastatin (CRESTOR) 5 MG tablet Take 1 tablet (5 mg total) by mouth daily. 05/19/23   Eden Emms, NP  tamoxifen (NOLVADEX) 20 MG tablet Take 1 tablet (20 mg total) by mouth daily. 04/01/23   [name], MD  Vitamin D, Ergocalciferol, (DRISDOL) 1.25 MG (50000 UNIT) CAPS capsule Take 1 capsule (50,000 Units total) by mouth every 7 (seven) days. 05/13/23   Eden Emms, NP      VITAL SIGNS:  Blood pressure 132/76, pulse 94, temperature 98.2 F (36.8 C), temperature source Oral, resp. rate 16, height 5\' 4"  (1.626 m), weight 77.1 kg, SpO2 95%.  PHYSICAL EXAMINATION:  Physical Exam  GENERAL:  72 y.o.-year-old patient lying in the bed with no acute distress.  EYES: Pupils equal, round, reactive to light and accommodation. No scleral icterus. Extraocular  muscles intact.  HEENT: Head atraumatic, normocephalic. Oropharynx and nasopharynx clear.  NECK:  Supple, no jugular venous distention. No thyroid enlargement, no tenderness.  LUNGS: Normal breath sounds bilaterally, no wheezing, rales,rhonchi or crepitation. No use of accessory muscles of respiration.  CARDIOVASCULAR: Regular rate and rhythm, S1, S2 normal. No murmurs, rubs, or gallops.  ABDOMEN: Soft, nondistended, nontender. Bowel sounds present. No organomegaly or mass.  EXTREMITIES: No pedal edema, cyanosis, or clubbing.  NEUROLOGIC: Cranial nerves II through XII are intact. Muscle strength 5/5 in all extremities. Sensation intact. Gait not checked.  PSYCHIATRIC: The patient is alert and oriented x 3.  Normal affect and good eye contact. SKIN: No obvious rash, lesion, or ulcer.   LABORATORY  PANEL:   CBC Recent Labs  Lab 06/24/23 2103  WBC 8.7  HGB 13.2  HCT 41.4  PLT 297   ------------------------------------------------------------------------------------------------------------------  Chemistries  Recent Labs  Lab 06/24/23 2103  NA 139  K 3.7  CL 104  CO2 23  GLUCOSE 122*  BUN 28*  CREATININE 0.94  CALCIUM 8.9  AST 28  ALT 20  ALKPHOS 34*  BILITOT 0.5   ------------------------------------------------------------------------------------------------------------------  Cardiac Enzymes No results for input(s): "TROPONINI" in the last 168 hours. ------------------------------------------------------------------------------------------------------------------  RADIOLOGY:  DG Chest Portable 1 View  Result Date: 06/24/2023 CLINICAL DATA:  Shortness of breath EXAM: PORTABLE CHEST 1 VIEW COMPARISON:  Chest x-ray 05/04/2023.  CT chest 06/23/2023. FINDINGS: Right chest port catheter tip ends in the SVC. The cardiomediastinal silhouette is within normal limits. Large hiatal hernia is again seen. There is a small focal band of opacity in the left lower lung, unchanged from  prior. Otherwise, the lungs appear clear. There is no pleural effusion or pneumothorax. No acute fractures are seen. IMPRESSION: 1. Stable focal area of atelectasis in the lingula. 2. Large hiatal hernia. Electronically Signed   By: Darliss Cheney M.D.   On: 06/24/2023 23:42   CT Angio Chest PE W and/or Wo Contrast  Result Date: 06/23/2023 CLINICAL DATA:  Lower extremity pain and swelling with chest pain, history of ovarian carcinoma, initial encounter EXAM: CT ANGIOGRAPHY CHEST WITH CONTRAST TECHNIQUE: Multidetector CT imaging of the chest was performed using the standard protocol during bolus administration of intravenous contrast. Multiplanar CT image reconstructions and MIPs were obtained to evaluate the vascular anatomy. RADIATION DOSE REDUCTION: This exam was performed according to the departmental dose-optimization program which includes automated exposure control, adjustment of the mA and/or kV according to patient size and/or use of iterative reconstruction technique. CONTRAST:  75mL OMNIPAQUE IOHEXOL 350 MG/ML SOLN COMPARISON:  05/04/2023, 03/29/2023 FINDINGS: Cardiovascular: Thoracic aorta and its branches demonstrate atherosclerotic calcifications. No aneurysmal dilatation or dissection is seen. Heart is not significantly enlarged in size. The pulmonary artery shows a normal branching pattern bilaterally. No intraluminal filling defect is identified. No significant coronary calcifications are noted. Mediastinum/Nodes: Thoracic inlet is within normal limits. No hilar or mediastinal adenopathy is noted. The esophagus as visualized is within normal limits. Moderate hiatal hernia is seen. Lungs/Pleura: Mild atelectatic changes are noted in the bases bilaterally. No focal confluent infiltrate is seen. No parenchymal nodules are noted. Upper Abdomen: Visualized upper abdomen demonstrates significant ascites as well as peritoneal implants consistent with the patient's known history of ovarian carcinoma and  peritoneal carcinomatosis. No other upper abdominal abnormality is seen. Musculoskeletal: Degenerative changes of the thoracic spine are noted. Review of the MIP images confirms the above findings. IMPRESSION: No evidence of pulmonary emboli. Basilar atelectatic changes. Ascites and peritoneal implants consistent with the patient's known history of ovarian carcinoma and peritoneal carcinomatosis. The overall appearance is similar to that seen on 03/29/23 Aortic Atherosclerosis (ICD10-I70.0). Electronically Signed   By: Alcide Clever M.D.   On: 06/23/2023 01:19   US Venous Img Lower Bilateral (DVT)  Result Date: 06/23/2023 CLINICAL DATA:  [id number] with swelling in the legs. EXAM: BILATERAL LOWER EXTREMITY VENOUS DOPPLER ULTRASOUND TECHNIQUE: Gray-scale sonography with graded compression, as well as color Doppler and duplex ultrasound were performed to evaluate the lower extremity deep venous systems from the level of the common femoral vein and including the common femoral, femoral, profunda femoral, popliteal and calf veins including the posterior tibial, peroneal and gastrocnemius veins when visible. The superficial great  saphenous vein was also interrogated. Spectral Doppler was utilized to evaluate flow at rest and with distal augmentation maneuvers in the common femoral, femoral and popliteal veins. COMPARISON:  Study of 11/27/2022 FINDINGS: RIGHT LOWER EXTREMITY Common Femoral Vein: No evidence of thrombus. Normal compressibility, respiratory phasicity and response to augmentation. Saphenofemoral Junction: No evidence of thrombus. Normal compressibility and flow on color Doppler imaging. Profunda Femoral Vein: No evidence of thrombus. Normal compressibility and flow on color Doppler imaging. Femoral Vein: No evidence of thrombus. Normal compressibility, respiratory phasicity and response to augmentation. Popliteal Vein: No evidence of thrombus. Normal compressibility, respiratory phasicity and response to  augmentation. Calf Veins: No evidence of thrombus. Normal compressibility and flow on color Doppler imaging. Superficial Great Saphenous Vein: No evidence of thrombus. Normal compressibility. Venous Reflux:  None. Other Findings: There is soft tissue edema in the popliteal fossa and foreleg. LEFT LOWER EXTREMITY Common Femoral Vein: No evidence of thrombus. Normal compressibility, respiratory phasicity and response to augmentation. Saphenofemoral Junction: No evidence of thrombus. Normal compressibility and flow on color Doppler imaging. Profunda Femoral Vein: No evidence of thrombus. Normal compressibility and flow on color Doppler imaging. Femoral Vein: No evidence of thrombus. Normal compressibility, respiratory phasicity and response to augmentation. Popliteal Vein: No evidence of thrombus. Normal compressibility, respiratory phasicity and response to augmentation. Calf Veins: No evidence of thrombus. Normal compressibility and flow on color Doppler imaging. Superficial Great Saphenous Vein: No evidence of thrombus. Normal compressibility. Venous Reflux:  None. Other Findings: There is soft tissue edema in the popliteal fossa and foreleg. IMPRESSION: No evidence of deep venous thrombosis in either lower extremity. Bilateral soft tissue edema in the popliteal fossae and calves. Electronically Signed   By: Almira Bar M.D.   On: 06/23/2023 00:40      IMPRESSION AND PLAN:  Assessment and Plan: No notes have been filed under this hospital service. Service: Hospitalist      DVT prophylaxis: Lovenox***  Advanced Care Planning:  Code Status: full code***  Family Communication:  The plan of care was discussed in details with the patient (and family). I answered all questions. The patient agreed to proceed with the above mentioned plan. Further management will depend upon hospital course. Disposition Plan: Back to previous home environment Consults called: none***  All the records are reviewed and  case discussed with ED provider.  Status is: Inpatient {Inpatient:23812}   At the time of the admission, it appears that the appropriate admission status for this patient is inpatient.  This is judged to be reasonable and necessary in order to provide the required intensity of service to ensure the patient's safety given the presenting symptoms, physical exam findings and initial radiographic and laboratory data in the context of comorbid conditions.  The patient requires inpatient status due to high intensity of service, high risk of further deterioration and high frequency of surveillance required.  I certify that at the time of admission, it is my clinical judgment that the patient will require inpatient hospital care extending more than 2 midnights.                            Dispo: The patient is from: Home              Anticipated d/c is to: Home              Patient currently is not medically stable to d/c.  Difficult to place patient: No  Hannah Beat M.D on 06/24/2023 at 11:57 PM  [facility]   From 7 PM-7 AM, contact night-coverage [email or web address]  CC: Primary care physician; Eden Emms, NP

## 2023-06-24 NOTE — ED Notes (Signed)
Patient transported to X-ray 

## 2023-06-24 NOTE — ED Triage Notes (Signed)
Pt reports seen here 2 days ago for bilateral lower leg edema. Hx of ascities. Was given diuretics while here and discharged with prescription. Reports continued swelling to feet and ankles and told to return to ED by pcp. Pt ambulatory to triage. Alert and oriented breathing unlabored. Pt speaking in full sentences.

## 2023-06-24 NOTE — ED Provider Notes (Signed)
[facility] Provider Note    Event Date/Time   First MD Initiated Contact with Patient 06/24/23 2146     (approximate)   History   Leg Swelling   HPI ASAYO BUCKALEW is a 72 y.o. female with ovarian cancer presenting today for continued leg swelling and shortness of breath.  Patient was seen in the emergency department 2 days ago for similar symptoms which have been worsening over the past 2 to 3 months.  Dyspnea on exertion is present occasional orthopnea.  No prior history of CHF.  Noted increased weight gain as well.  Otherwise denying chest pain, fever, chills, abdominal pain.  During ED visit 2 days ago, patient had CTA of chest which showed no evidence of PE but did show ascites on her abdomen.  Duplex of bilateral lower extremities with no evidence of DVT.  At that time discussion for IV Lasix and admission versus discharge.  Patient declined IV Lasix and was discharged on p.o. Lasix.  Over the past 2 days she has noticed worsening swelling with no increase in urinary output while being on Lasix.  Still having ongoing shortness of breath.     Physical Exam   Triage Vital Signs: ED Triage Vitals  Encounter Vitals Group     BP 06/24/23 2101 (!) 150/97     Systolic BP Percentile --      Diastolic BP Percentile --      Pulse Rate 06/24/23 2101 (!) 109     Resp 06/24/23 2102 18     Temp 06/24/23 2102 98.2 F (36.8 C)     Temp Source 06/24/23 2102 Oral     SpO2 06/24/23 2101 96 %     Weight 06/24/23 2059 170 lb (77.1 kg)     Height 06/24/23 2059 5\' 4"  (1.626 m)     Head Circumference --      Peak Flow --      Pain Score 06/24/23 2059 4     Pain Loc --      Pain Education --      Exclude from Growth Chart --     Most recent vital signs: Vitals:   06/24/23 2317 06/24/23 2319  BP: 132/76   Pulse:  94  Resp:  16  Temp:    SpO2:  95%   Physical Exam: I have reviewed the vital signs and nursing notes. General: Awake, alert, no acute distress.   Nontoxic appearing. Head:  Atraumatic, normocephalic.   ENT:  EOM intact, PERRL. Oral mucosa is pink and moist with no lesions. Neck: Neck is supple with full range of motion, No meningeal signs. Cardiovascular:  RRR, No murmurs. Peripheral pulses palpable and equal bilaterally. Respiratory:  Symmetrical chest wall expansion.  No rhonchi, rales, or wheezes.  Good air movement throughout.  No use of accessory muscles.   Musculoskeletal: 2+ pitting edema throughout bilateral lower extremities.  Moving extremities with full ROM Abdomen:  Soft, nontender, nondistended. Neuro:  GCS 15, moving all four extremities, interacting appropriately. Speech clear. Psych:  Calm, appropriate.   Skin:  Warm, dry, no rash.    ED Results / Procedures / Treatments   Labs (all labs ordered are listed, but only abnormal results are displayed) Labs Reviewed  COMPREHENSIVE METABOLIC PANEL - Abnormal; Notable for the following components:      Result Value   Glucose, Bld 122 (*)    BUN 28 (*)    Alkaline Phosphatase 34 (*)    All other  components within normal limits  CBC WITH DIFFERENTIAL/PLATELET  BRAIN NATRIURETIC PEPTIDE     EKG My EKG interpretation: Rate of 97, normal sinus rhythm, left anterior fascicular block.  No acute ST elevation or depression.   RADIOLOGY No acute cardiopulmonary pathology per my interpretation   PROCEDURES:  Critical Care performed: No  Procedures   MEDICATIONS ORDERED IN ED: Medications  furosemide (LASIX) injection 40 mg (40 mg Intravenous Given 06/24/23 2228)     IMPRESSION / MDM / ASSESSMENT AND PLAN / ED COURSE  I reviewed the triage vital signs and the nursing notes.                              Differential diagnosis includes, but is not limited to, volume overload, CHF, worsening ascites and anasarca from ovarian cancer.  Patient's presentation is most consistent with exacerbation of chronic illness.  Patient is a 72 year old female presenting  today with volume overload worsening dyspnea on exertion and leg swelling.  Given ongoing ovarian cancer concerned that all fluid may be secondary to malignancy.  However, has never been evaluated for CHF and does have pitting edema with worsening dyspnea on exertion.  BNP is normal and laboratory workup otherwise reassuring at this time.  Was seen 2 days ago for similar symptoms and trialed on oral Lasix without improvement.  At this time, will admit and start on the IV Lasix with plan for echo tomorrow to evaluate for concerns of heart failure.  Hospitalist was agreed to admit patient for further care.  The patient is on the cardiac monitor to evaluate for evidence of arrhythmia and/or significant heart rate changes.     FINAL CLINICAL IMPRESSION(S) / ED DIAGNOSES   Final diagnoses:  Hypervolemia, unspecified hypervolemia type  Dyspnea on exertion     Rx / DC Orders   ED Discharge Orders     None        Note:  This document was prepared using Dragon voice recognition software and may include unintentional dictation errors.   Janith Lima, MD 06/24/23 [phone]

## 2023-06-25 ENCOUNTER — Encounter: Payer: Self-pay | Admitting: Family Medicine

## 2023-06-25 DIAGNOSIS — C482 Malignant neoplasm of peritoneum, unspecified: Secondary | ICD-10-CM

## 2023-06-25 DIAGNOSIS — R180 Malignant ascites: Secondary | ICD-10-CM | POA: Diagnosis not present

## 2023-06-25 DIAGNOSIS — E785 Hyperlipidemia, unspecified: Secondary | ICD-10-CM | POA: Diagnosis not present

## 2023-06-25 DIAGNOSIS — I5031 Acute diastolic (congestive) heart failure: Principal | ICD-10-CM

## 2023-06-25 DIAGNOSIS — R0609 Other forms of dyspnea: Secondary | ICD-10-CM

## 2023-06-25 DIAGNOSIS — Z86718 Personal history of other venous thrombosis and embolism: Secondary | ICD-10-CM

## 2023-06-25 DIAGNOSIS — R600 Localized edema: Secondary | ICD-10-CM

## 2023-06-25 LAB — BASIC METABOLIC PANEL
Anion gap: 8 (ref 5–15)
BUN: 24 mg/dL — ABNORMAL HIGH (ref 8–23)
CO2: 27 mmol/L (ref 22–32)
Calcium: 8.3 mg/dL — ABNORMAL LOW (ref 8.9–10.3)
Chloride: 102 mmol/L (ref 98–111)
Creatinine, Ser: 0.84 mg/dL (ref 0.44–1.00)
GFR, Estimated: >60 mL/min (ref >60)
Glucose, Bld: 109 mg/dL — ABNORMAL HIGH (ref 70–99)
Potassium: 3.4 mmol/L — ABNORMAL LOW (ref 3.5–5.1)
Sodium: 137 mmol/L (ref 135–145)

## 2023-06-25 LAB — CK: Total CK: 70 U/L (ref 38–234)

## 2023-06-25 MED ORDER — TORSEMIDE 20 MG PO TABS
20.0000 mg | ORAL_TABLET | Freq: Every day | ORAL | Status: DC
  Administered 2023-06-25 – 2023-06-26 (×2): 20 mg via ORAL
  Filled 2023-06-25 (×2): qty 1

## 2023-06-25 MED ORDER — POTASSIUM CHLORIDE CRYS ER 20 MEQ PO TBCR
40.0000 meq | EXTENDED_RELEASE_TABLET | Freq: Once | ORAL | Status: AC
  Administered 2023-06-25: 40 meq via ORAL
  Filled 2023-06-25: qty 2

## 2023-06-25 NOTE — Assessment & Plan Note (Signed)
-  We will continue statin therapy.

## 2023-06-25 NOTE — Hospital Course (Addendum)
Taken from H&P.  [name] [name] is a 72 y.o. Caucasian female with medical history significant for anxiety, depression, ovarian cancer, status post salpingo-oophorectomy and radiotherapy, rheumatoid arthritis, right leg DVT, on Eliquis, MVP, who presented to the emergency room with complaint of worsening dyspnea on exertion with associated worsening lower extremity edema that started a couple of days when she came to the emergency room and was ruled out DVT and had a CT showing ascites.  She was given a prescription for Lasix which she has been taking.  She has not noticed increased urine output and improvement of her symptoms.   On arrival to ED ER, BP 150/97, HR 109, labs with BUN 28, CBC and rest of CMP normal. CXR with stable focal atelectasis in the lingula and large hiatal hernia.  CTA chest on 9/19 with no PE, did show bibasal atelectasis, ascites and peritoneal implants consistent with her known diagnosis of ovarian cancer with peritoneal carcinomatosis and aortic atherosclerosis.  Patient was given 40 mg of IV Lasix and started on IV diuresis.  9/21: Vital normal, BNP normal at 50, troponin negative.  Per chart review worsening CA125 on 05/31/2023.  Echocardiogram pending.  9/22: Remained hemodynamically stable.  Slight worsening of abdominal distention due to ascites, patient wants to have paracentesis done but unfortunately due to staffing issues over the weekend we were unable to obtain it.  An outpatient appointment was made for Wednesday after discussing with patient.  Patient likely has malignant ascites and might need frequent paracentesis which can be arranged by her oncologist.  Echocardiogram with no significant abnormality.  Patient was started on torsemide 20 mg daily which she will continue on discharge.  Patient will continue on current medications and need to have a close follow-up with her providers for further recommendations.

## 2023-06-25 NOTE — Consult Note (Signed)
Cardiology Consult    Patient ID: SHAVELLE TEP MRN: [id number], DOB/AGE: 11/29/1950   Admit date: 06/24/2023 Date of Consult: 06/25/2023  Primary Physician: Eden Emms, NP Primary Cardiologist: None - new Requesting Provider: Andrez Grime, MD  Patient Profile    [name] [name] is a 72 y.o. female with a history of ovarian cancer, peritoneal carcinomatosis w/ ascites, RA, DVT of R femoral vein 2/204 following R hip fx and hemiarthroplasty in 10/2022 - now on eliquis, anxiety, depression, HL, neutropenia (in setting of chemo), nephrolithiasis, taxol neuropathy,  chronic neck pain, and osteopenia, who is being seen today for the evaluation of lower extremity edema at the request of Dr. Arville Care.  Past Medical History   Past Medical History:  Diagnosis Date   Anxiety and depression     Chest pain    a. 09/2004 MV: No isch/infarct.   DJD (degenerative joint disease)    Elevated cholesterol    Endometrial polyp    Family history of bladder cancer    Family history of lymphoma    History of radiation therapy 10/25/16-12/01/16   right pelvis/60.2 Gy in 28 fractions   IRRITABLE BOWEL SYNDROME, HX OF 05/15/2008   Kidney stone    MVP (mitral valve prolapse)    occ palpitations    Ovarian ca (HCC) 07/2012       Rheumatoid arthritis(714.0) ~ 2010   dr Dareen Piano   Shingles     Past Surgical History:  Procedure Laterality Date   ABDOMINAL HYSTERECTOMY  08/22/2012   Procedure: HYSTERECTOMY ABDOMINAL;  Surgeon: Jeannette Corpus, MD;  Location: [facility];  Service: Gynecology;  Laterality: N/A;   ANTERIOR APPROACH HEMI HIP ARTHROPLASTY Right 11/03/2022   Procedure: ANTERIOR APPROACH HEMI HIP ARTHROPLASTY;  Surgeon: Lyndle Herrlich, MD;  Location: [facility];  Service: Orthopedics;  Laterality: Right;   BACK SURGERY  10/04/2006   Dr Dutch Quint   COLONOSCOPY  04/20/2004   COLONOSCOPY W/ BIOPSIES  07/04/2014   COLOSTOMY REVISION  08/22/2012   Procedure: COLON RESECTION SIGMOID;  Surgeon:  Jeannette Corpus, MD;  Location: [facility];  Service: Gynecology;;  Rectal Sigmoid resection and low rectal anastomosis   CYST ON NECK  10/04/2009   DEBULKING  08/22/2012   Procedure: DEBULKING;  Surgeon: Jeannette Corpus, MD;  Location: [facility];  Service: Gynecology;  Laterality: N/A;  Radical tumor debulking, Bilateral Ureterolysis   DILATION AND CURETTAGE OF UTERUS  10/04/2002   WITH HYSTEROSCOPY   HAND SURGERY  10/04/1994   HYSTEROSCOPY  10/04/2002   D&C   IR IMAGING GUIDED PORT INSERTION  09/01/2021   KIDNEY STONE SURGERY Right    with stent placement  Mar 03 2021   NECK SURGERY  10/05/2007   SPURS   OMENTECTOMY  08/22/2012   Procedure: OMENTECTOMY;  Surgeon: Jeannette Corpus, MD;  Location: [facility];  Service: Gynecology;  Laterality: N/A;   SALPINGOOPHORECTOMY  08/22/2012   Procedure: SALPINGO OOPHORECTOMY;  Surgeon: Jeannette Corpus, MD;  Location: [facility];  Service: Gynecology;  Laterality: Bilateral;   TUBAL LIGATION  10/04/1978     Allergies  Allergies  Allergen Reactions   Atorvastatin Other (See Comments)    Leg pain   Macrodantin Other (See Comments)    Unknown Reaction   Decongest-Aid [Pseudoephedrine] Palpitations    History of Present Illness    72 y/o ? w/ a h/o ovarian cancer, peritoneal carcinomatosis w/ ascites, RA, DVT of R femoral vein 2/204 following R hip fx and hemiarthroplasty  in 10/2022 - now on eliquis, anxiety, depression, HL, neutropenia (in setting of chemo), nephrolithiasis, taxol neuropathy,  chronic neck pain, and osteopenia.  Prior h/o c/p w/ neg MV in 2005.  As noted, she developed a RLE DVT in 11/2022 following R hip fx and hemiarthroplasty.  She has since been on Eliquis.  Over the past several months, she has been experiencing left greater than right bilateral lower extremity/ankle/pedal edema, and has also noted progressive increase in abdominal girth.  She is known to have ascites, which she feels has worsened.  She does  not experience dyspnea on exertion with usual activities though with prolonged walking, she has noted a reduction in activity tolerance and thinks maybe sometimes she has a little tightness in her chest.  She reported lower extremity swelling to her primary care provider on 8/7 and proBNP was eval and nl @ 53.  She has been treated conservatively.    Due to worsening swelling over the past several months, she was seen @ urgent care on 9/18 and referred to the ED.  Here, BNP was nl @ 40.0.  Labs otw unremarkable and lower ext u/s neg for DVT.  She was offered a dose of IV lasix but declined and was subsequently d/c'd w/ a Rx for lasix 20 mg daily x 1 wk. HFrEF despite 2 doses of Lasix, she did not notice any significant improvement in her swelling.  She contacted her oncologist on September 20 and was advised to present back to the ED.  Here, she was afebrile, tachycardic @ 109 (ECG: RSR, 80, anterolateral infarct - no acute ST/T changes), hypertensive @ 150/97.  SpO2 96% on RA.  BNP 50.2.  Labs again otw unremarkable (albumin normal at 3.6).  CTA chest neg for PE w/ note of stable ascites and Ao atherosclerosis.  She has received intravenous Lasix and notes good response thus far, though intake and output has not been recorded.  She is lying flat currently and denies any prior history of PND orthopnea.  She does report intermittent bendopnea over the past several months.  Inpatient Medications     apixaban  5 mg Oral BID   furosemide  40 mg Intravenous Q12H   multivitamin with minerals  1 tablet Oral Daily   omega-3 acid ethyl esters  1 g Oral Daily   rosuvastatin  5 mg Oral Daily   tamoxifen  20 mg Oral Daily    Family History    Family History  Problem Relation Age of Onset   Heart disease Mother    Bladder Cancer Father 37   Osteoporosis Sister    Lymphoma Paternal Aunt    Colon cancer Neg Hx    Rectal cancer Neg Hx    Stomach cancer Neg Hx    Diabetes Neg Hx    Stroke Neg Hx    Colon  polyps Neg Hx    She indicated that her mother is deceased. She indicated that her father is alive. She indicated that her sister is alive. She indicated that her brother is alive. She indicated that her maternal grandmother is deceased. She indicated that her maternal grandfather is deceased. She indicated that her paternal grandmother is deceased. She indicated that her paternal grandfather is deceased. She indicated that only one of her two paternal aunts is alive. She indicated that her paternal uncle is alive. She indicated that the status of her neg hx is unknown.   Social History    Social History   Socioeconomic History  Marital status: Legally Separated    Spouse name: Not on file   Number of children: 2   Years of education: Not on file   Highest education level: Not on file  Occupational History   Occupation: disability d/t RA  Tobacco Use   Smoking status: Never   Smokeless tobacco: Never  Vaping Use   Vaping status: Never Used  Substance and Sexual Activity   Alcohol use: No   Drug use: No   Sexual activity: Not Currently    Birth control/protection: Post-menopausal, Surgical  Other Topics Concern   Not on file  Social History Narrative   [name] is her daughter    Household-- pr and husband   Social Determinants of Health   Financial Resource Strain: Low Risk  (05/04/2023)   Overall Financial Resource Strain (CARDIA)    Difficulty of Paying Living Expenses: Not hard at all  Food Insecurity: No Food Insecurity (06/25/2023)   Hunger Vital Sign    Worried About Running Out of Food in the Last Year: Never true    Ran Out of Food in the Last Year: Never true  Transportation Needs: No Transportation Needs (06/25/2023)   PRAPARE - Administrator, Civil Service (Medical): No    Lack of Transportation (Non-Medical): No  Physical Activity: Insufficiently Active (05/04/2023)   Exercise Vital Sign    Days of Exercise per Week: 4 days    Minutes of  Exercise per Session: 30 min  Stress: No Stress Concern Present (05/04/2023)   [employer] of Occupational Health - Occupational Stress Questionnaire    Feeling of Stress : Not at all  Social Connections: Moderately Isolated (05/04/2023)   Social Connection and Isolation Panel [NHANES]    Frequency of Communication with Friends and Family: More than three times a week    Frequency of Social Gatherings with Friends and Family: More than three times a week    Attends Religious Services: More than 4 times per year    Active Member of [employer] or Organizations: No    Attends Banker Meetings: Never    Marital Status: Separated  Intimate Partner Violence: Not At Risk (06/25/2023)   Humiliation, Afraid, Rape, and Kick questionnaire    Fear of Current or Ex-Partner: No    Emotionally Abused: No    Physically Abused: No    Sexually Abused: No     Review of Systems    General:  No chills, fever, night sweats or weight changes.  Cardiovascular:  No chest pain, +++ dyspnea on exertion with higher levels of activity, +++ bilateral lower extremity edema and increasing abdominal girth, no orthopnea, palpitations, paroxysmal nocturnal dyspnea. Dermatological: No rash, lesions/masses Respiratory: No cough, +++ dyspnea Urologic: No hematuria, dysuria Abdominal:   +++ Increase in abdominal girth with fullness/tightness.  No nausea, vomiting, diarrhea, bright red blood per rectum, melena, or hematemesis Neurologic:  No visual changes, wkns, changes in mental status. All other systems reviewed and are otherwise negative except as noted above.  Physical Exam    Blood pressure 114/70, pulse 76, temperature 98.3 F (36.8 C), temperature source Oral, resp. rate 18, height 5\' 4"  (1.626 m), weight 76.6 kg, SpO2 96%.  General: Pleasant, NAD Psych: Normal affect. Neuro: Alert and oriented X 3. Moves all extremities spontaneously. HEENT: Normal  Neck: Supple without bruits or JVD. Lungs:   Resp regular and unlabored, CTA. Heart: RRR no s3, s4, or murmurs. Abdomen: Protuberant and semifirm.  Nontender. BS + x  4.  Extremities: No clubbing, cyanosis.  1-2+ bilateral lower extremity edema to the mid calves. DP/PT2+, Radials 2+ and equal bilaterally.  Labs    Cardiac Enzymes Recent Labs  Lab 06/22/23 2013 06/23/23 0138  TROPONINIHS 3 3     BNP    Component Value Date/Time   BNP 50.2 06/24/2023 2103    ProBNP    Component Value Date/Time   PROBNP 53.0 05/11/2023 1601    Lab Results  Component Value Date   WBC 8.7 06/24/2023   HGB 13.2 06/24/2023   HCT 41.4 06/24/2023   MCV 99.8 06/24/2023   PLT 297 06/24/2023    Recent Labs  Lab 06/24/23 2103  NA 139  K 3.7  CL 104  CO2 23  BUN 28*  CREATININE 0.94  CALCIUM 8.9  PROT 7.1  BILITOT 0.5  ALKPHOS 34*  ALT 20  AST 28  GLUCOSE 122*   Lab Results  Component Value Date   CHOL 225 (H) 05/11/2023   HDL 39.80 05/11/2023   LDLCALC 140 (H) 07/07/2021   TRIG 214.0 (H) 05/11/2023   Lab Results  Component Value Date   DDIMER 3.91 (H) 11/27/2022      Radiology Studies    DG Chest Portable 1 View  Result Date: 06/24/2023 CLINICAL DATA:  Shortness of breath EXAM: PORTABLE CHEST 1 VIEW COMPARISON:  Chest x-ray 05/04/2023.  CT chest 06/23/2023. FINDINGS: Right chest port catheter tip ends in the SVC. The cardiomediastinal silhouette is within normal limits. Large hiatal hernia is again seen. There is a small focal band of opacity in the left lower lung, unchanged from prior. Otherwise, the lungs appear clear. There is no pleural effusion or pneumothorax. No acute fractures are seen. IMPRESSION: 1. Stable focal area of atelectasis in the lingula. 2. Large hiatal hernia. Electronically Signed   By: Darliss Cheney M.D.   On: 06/24/2023 23:42   MM 3D SCREENING MAMMOGRAM BILATERAL BREAST  Result Date: 06/23/2023 CLINICAL DATA:  Screening. EXAM: DIGITAL SCREENING BILATERAL MAMMOGRAM WITH TOMOSYNTHESIS AND CAD  TECHNIQUE: Bilateral screening digital craniocaudal and mediolateral oblique mammograms were obtained. Bilateral screening digital breast tomosynthesis was performed. The images were evaluated with computer-aided detection. COMPARISON:  Previous exam(s). ACR Breast Density Category c: The breasts are heterogeneously dense, which may obscure small masses. FINDINGS: There are no findings suspicious for malignancy. IMPRESSION: No mammographic evidence of malignancy. A result letter of this screening mammogram will be mailed directly to the patient. RECOMMENDATION: Screening mammogram in one year. (Code:SM-B-01Y) BI-RADS CATEGORY  1: Negative. Electronically Signed   By: Sherron Ales M.D.   On: 06/23/2023 16:52   CT Angio Chest PE W and/or Wo Contrast  Result Date: 06/23/2023 CLINICAL DATA:  Lower extremity pain and swelling with chest pain, history of ovarian carcinoma, initial encounter EXAM: CT ANGIOGRAPHY CHEST WITH CONTRAST TECHNIQUE: Multidetector CT imaging of the chest was performed using the standard protocol during bolus administration of intravenous contrast. Multiplanar CT image reconstructions and MIPs were obtained to evaluate the vascular anatomy. RADIATION DOSE REDUCTION: This exam was performed according to the departmental dose-optimization program which includes automated exposure control, adjustment of the mA and/or kV according to patient size and/or use of iterative reconstruction technique. CONTRAST:  75mL OMNIPAQUE IOHEXOL 350 MG/ML SOLN COMPARISON:  05/04/2023, 03/29/2023 FINDINGS: Cardiovascular: Thoracic aorta and its branches demonstrate atherosclerotic calcifications. No aneurysmal dilatation or dissection is seen. Heart is not significantly enlarged in size. The pulmonary artery shows a normal branching pattern bilaterally. No intraluminal filling defect  is identified. No significant coronary calcifications are noted. Mediastinum/Nodes: Thoracic inlet is within normal limits. No hilar  or mediastinal adenopathy is noted. The esophagus as visualized is within normal limits. Moderate hiatal hernia is seen. Lungs/Pleura: Mild atelectatic changes are noted in the bases bilaterally. No focal confluent infiltrate is seen. No parenchymal nodules are noted. Upper Abdomen: Visualized upper abdomen demonstrates significant ascites as well as peritoneal implants consistent with the patient's known history of ovarian carcinoma and peritoneal carcinomatosis. No other upper abdominal abnormality is seen. Musculoskeletal: Degenerative changes of the thoracic spine are noted. Review of the MIP images confirms the above findings. IMPRESSION: No evidence of pulmonary emboli. Basilar atelectatic changes. Ascites and peritoneal implants consistent with the patient's known history of ovarian carcinoma and peritoneal carcinomatosis. The overall appearance is similar to that seen on 03/29/23 Aortic Atherosclerosis (ICD10-I70.0). Electronically Signed   By: Alcide Clever M.D.   On: 06/23/2023 01:19   US Venous Img Lower Bilateral (DVT)  Result Date: 06/23/2023 CLINICAL DATA:  [id number] with swelling in the legs. EXAM: BILATERAL LOWER EXTREMITY VENOUS DOPPLER ULTRASOUND TECHNIQUE: Gray-scale sonography with graded compression, as well as color Doppler and duplex ultrasound were performed to evaluate the lower extremity deep venous systems from the level of the common femoral vein and including the common femoral, femoral, profunda femoral, popliteal and calf veins including the posterior tibial, peroneal and gastrocnemius veins when visible. The superficial great saphenous vein was also interrogated. Spectral Doppler was utilized to evaluate flow at rest and with distal augmentation maneuvers in the common femoral, femoral and popliteal veins. COMPARISON:  Study of 11/27/2022 FINDINGS: RIGHT LOWER EXTREMITY Common Femoral Vein: No evidence of thrombus. Normal compressibility, respiratory phasicity and response to  augmentation. Saphenofemoral Junction: No evidence of thrombus. Normal compressibility and flow on color Doppler imaging. Profunda Femoral Vein: No evidence of thrombus. Normal compressibility and flow on color Doppler imaging. Femoral Vein: No evidence of thrombus. Normal compressibility, respiratory phasicity and response to augmentation. Popliteal Vein: No evidence of thrombus. Normal compressibility, respiratory phasicity and response to augmentation. Calf Veins: No evidence of thrombus. Normal compressibility and flow on color Doppler imaging. Superficial Great Saphenous Vein: No evidence of thrombus. Normal compressibility. Venous Reflux:  None. Other Findings: There is soft tissue edema in the popliteal fossa and foreleg. LEFT LOWER EXTREMITY Common Femoral Vein: No evidence of thrombus. Normal compressibility, respiratory phasicity and response to augmentation. Saphenofemoral Junction: No evidence of thrombus. Normal compressibility and flow on color Doppler imaging. Profunda Femoral Vein: No evidence of thrombus. Normal compressibility and flow on color Doppler imaging. Femoral Vein: No evidence of thrombus. Normal compressibility, respiratory phasicity and response to augmentation. Popliteal Vein: No evidence of thrombus. Normal compressibility, respiratory phasicity and response to augmentation. Calf Veins: No evidence of thrombus. Normal compressibility and flow on color Doppler imaging. Superficial Great Saphenous Vein: No evidence of thrombus. Normal compressibility. Venous Reflux:  None. Other Findings: There is soft tissue edema in the popliteal fossa and foreleg. IMPRESSION: No evidence of deep venous thrombosis in either lower extremity. Bilateral soft tissue edema in the popliteal fossae and calves. Electronically Signed   By: Almira Bar M.D.   On: 06/23/2023 00:40    ECG & Cardiac Imaging    06/24/2023 @ 22:13 - RSR, 97, LAD, LAFB, anterolateral infarct. No acute ST/T changes - personally  reviewed.  Assessment & Plan    1.  Lower extremity edema/ascites: Patient notes that over the past several months, she has been experiencing increasing lower extremity  swelling and increasing abdominal girth.  In that setting, she has had some increase in dyspnea on exertion with higher levels of activity though no significant symptoms with usual activities.  She does not experience PND or orthopnea.  BNP was normal in August and she was managed conservatively.  She presented to the emergency department September 18 due to swelling in lower extremity ultrasound was negative for DVT.  She was prescribed Lasix, and when symptoms did not improve after 2 doses, she came back to the ED September 20.  BNP is again normal.  Chest x-ray without acute findings.  She does have ascites on CT, which is unchanged since June.  She has never had an echocardiogram and this is currently pending.  No significant JVD on examination.  Continue IV Lasix for the time being.  Further recommendations pending echo.  2.  Right lower extremity DVT: Occurred in February following hip surgery.  On Eliquis.  Recent imaging without recurrent DVT.  Signed, Nicolasa Ducking, NP 06/25/2023, 7:58 AM  For questions or updates, please contact   Please consult [email or web address] for contact info under Cardiology/STEMI.

## 2023-06-25 NOTE — Progress Notes (Signed)
Progress Note   Patient: [name] [name]:[id number] DOB: Nov 08, 1950 DOA: 06/24/2023     1 DOS: the patient was seen and examined on 06/25/2023   Brief hospital course: Taken from H&P.  [name] [name] is a 72 y.o. Caucasian female with medical history significant for anxiety, depression, ovarian cancer, status post salpingo-oophorectomy and radiotherapy, rheumatoid arthritis, right leg DVT, on Eliquis, MVP, who presented to the emergency room with complaint of worsening dyspnea on exertion with associated worsening lower extremity edema that started a couple of days when she came to the emergency room and was ruled out DVT and had a CT showing ascites.  She was given a prescription for Lasix which she has been taking.  She has not noticed increased urine output and improvement of her symptoms.   On arrival to ED ER, BP 150/97, HR 109, labs with BUN 28, CBC and rest of CMP normal. CXR with stable focal atelectasis in the lingula and large hiatal hernia.  CTA chest on 9/19 with no PE, did show bibasal atelectasis, ascites and peritoneal implants consistent with her known diagnosis of ovarian cancer with peritoneal carcinomatosis and aortic atherosclerosis.  Patient was given 40 mg of IV Lasix and started on IV diuresis.  9/21: Vital normal, BNP normal at 50, troponin negative.  Per chart review worsening CA125 on 05/31/2023.  Echocardiogram pending.    Assessment and Plan: * Acute diastolic CHF (congestive heart failure) (HCC)  Congestive heart failure unlikely as BNP was normal at 50.  Mild lower extremity edema and some abdominal distention with ascites.  Likely related to her malignancy. She was started on IV diuresis and cardiology was also consulted. Pending echocardiogram -Cardiology switched her to 20 mg daily torsemide, they also does not think it is related to cardiac pathology.    Ovarian cancer (HCC) - She has peritoneal carcinomatosis, likely the culprit for her ascites. - We  will continue tamoxifen. - She is status post salpingo-oophorectomy and radiotherapy.  History of DVT of lower extremity - Continue Eliquis.  Dyslipidemia - We will continue statin therapy.   Subjective: Patient was seen and examined today.  She was very concerned about her heart.  Reassured her that it does not look like cardiac, likely secondary to her malignancy. No chest pain, making good amount of urine but not saving for measurement.  Physical Exam: Vitals:   06/25/23 0017 06/25/23 0506 06/25/23 1313 06/25/23 1313  BP: (!) 127/90 114/70 115/82 115/82  Pulse: 94 76 84 84  Resp: 18 18  17   Temp: 98.2 F (36.8 C) 98.3 F (36.8 C) 98.8 F (37.1 C) 98.8 F (37.1 C)  TempSrc: Oral Oral Oral   SpO2: 98% 96% 93% 93%  Weight:  76.6 kg    Height:       General.  Frail elderly lady, in no acute distress. Pulmonary.  Lungs clear bilaterally, normal respiratory effort. CV.  Regular rate and rhythm, no JVD, rub or murmur. Abdomen.  Soft, nontender, mildly distended, BS positive. CNS.  Alert and oriented .  No focal neurologic deficit. Extremities.  Trace LE edema, no cyanosis, pulses intact and symmetrical. Psychiatry.  Judgment and insight appears normal.   Data Reviewed: Prior data reviewed  Family Communication: Talked with husband on phone  Disposition: Status is: Inpatient Remains inpatient appropriate because: Severity of illness  Planned Discharge Destination: Home  DVT prophylaxis.  Eliquis Time spent: 45 minutes  This record has been created using Conservation officer, historic buildings. Errors have been  sought and corrected,but may not always be located. Such creation errors do not reflect on the standard of care.   Author: Arnetha Courser, MD 06/25/2023 2:38 PM  For on call review [email or web address].

## 2023-06-25 NOTE — Assessment & Plan Note (Signed)
-   She has peritoneal carcinomatosis, likely the culprit for her ascites. - We will continue tamoxifen. - She is status post salpingo-oophorectomy and radiotherapy.

## 2023-06-25 NOTE — Plan of Care (Signed)
  Problem: Education: Goal: Knowledge of the prescribed therapeutic regimen will improve Outcome: Progressing Goal: Understanding of discharge needs will improve Outcome: Progressing Goal: Individualized Educational Video(s) Outcome: Progressing   Problem: Activity: Goal: Ability to avoid complications of mobility impairment will improve Outcome: Progressing Goal: Ability to tolerate increased activity will improve Outcome: Progressing   Problem: Clinical Measurements: Goal: Postoperative complications will be avoided or minimized Outcome: Progressing   Problem: Pain Management: Goal: Pain level will decrease with appropriate interventions Outcome: Progressing   Problem: Skin Integrity: Goal: Will show signs of wound healing Outcome: Progressing   Problem: Education: Goal: Ability to demonstrate management of disease process will improve Outcome: Progressing Goal: Ability to verbalize understanding of medication therapies will improve Outcome: Progressing Goal: Individualized Educational Video(s) Outcome: Progressing   Problem: Activity: Goal: Capacity to carry out activities will improve Outcome: Progressing   Problem: Cardiac: Goal: Ability to achieve and maintain adequate cardiopulmonary perfusion will improve Outcome: Progressing   Problem: Education: Goal: Knowledge of General Education information will improve Description: Including pain rating scale, medication(s)/side effects and non-pharmacologic comfort measures Outcome: Progressing   Problem: Health Behavior/Discharge Planning: Goal: Ability to manage health-related needs will improve Outcome: Progressing   Problem: Clinical Measurements: Goal: Ability to maintain clinical measurements within normal limits will improve Outcome: Progressing Goal: Will remain free from infection Outcome: Progressing Goal: Diagnostic test results will improve Outcome: Progressing Goal: Respiratory complications will  improve Outcome: Progressing Goal: Cardiovascular complication will be avoided Outcome: Progressing   Problem: Activity: Goal: Risk for activity intolerance will decrease Outcome: Progressing   Problem: Nutrition: Goal: Adequate nutrition will be maintained Outcome: Progressing   Problem: Coping: Goal: Level of anxiety will decrease Outcome: Progressing   Problem: Elimination: Goal: Will not experience complications related to bowel motility Outcome: Progressing Goal: Will not experience complications related to urinary retention Outcome: Progressing   Problem: Pain Managment: Goal: General experience of comfort will improve Outcome: Progressing   Problem: Safety: Goal: Ability to remain free from injury will improve Outcome: Progressing   Problem: Skin Integrity: Goal: Risk for impaired skin integrity will decrease Outcome: Progressing

## 2023-06-25 NOTE — Assessment & Plan Note (Signed)
-   Continue Eliquis 

## 2023-06-25 NOTE — Assessment & Plan Note (Addendum)
Congestive heart failure unlikely as BNP was normal at 50.  Mild lower extremity edema and some abdominal distention with ascites.  Likely related to her malignancy. She was started on IV diuresis and cardiology was also consulted. Pending echocardiogram -Cardiology switched her to 20 mg daily torsemide, they also does not think it is related to cardiac pathology.

## 2023-06-25 NOTE — Plan of Care (Signed)
Problem: Education: Goal: Knowledge of the prescribed therapeutic regimen will improve Outcome: Progressing Goal: Understanding of discharge needs will improve Outcome: Progressing Goal: Individualized Educational Video(s) Outcome: Progressing   Problem: Activity: Goal: Ability to avoid complications of mobility impairment will improve Outcome: Progressing Goal: Ability to tolerate increased activity will improve Outcome: Progressing   Problem: Clinical Measurements: Goal: Postoperative complications will be avoided or minimized Outcome: Progressing   Problem: Pain Management: Goal: Pain level will decrease with appropriate interventions Outcome: Progressing   Problem: Skin Integrity: Goal: Will show signs of wound healing Outcome: Progressing   Problem: Education: Goal: Ability to demonstrate management of disease process will improve Outcome: Progressing Goal: Ability to verbalize understanding of medication therapies will improve Outcome: Progressing Goal: Individualized Educational Video(s) Outcome: Progressing   Problem: Activity: Goal: Capacity to carry out activities will improve Outcome: Progressing   Problem: Cardiac: Goal: Ability to achieve and maintain adequate cardiopulmonary perfusion will improve Outcome: Progressing   Problem: Education: Goal: Knowledge of General Education information will improve Description: Including pain rating scale, medication(s)/side effects and non-pharmacologic comfort measures Outcome: Progressing   Problem: Health Behavior/Discharge Planning: Goal: Ability to manage health-related needs will improve Outcome: Progressing   Problem: Clinical Measurements: Goal: Ability to maintain clinical measurements within normal limits will improve Outcome: Progressing Goal: Will remain free from infection Outcome: Progressing Goal: Diagnostic test results will improve Outcome: Progressing Goal: Respiratory complications will  improve Outcome: Progressing Goal: Cardiovascular complication will be avoided Outcome: Progressing   Problem: Activity: Goal: Risk for activity intolerance will decrease Outcome: Progressing   Problem: Nutrition: Goal: Adequate nutrition will be maintained Outcome: Progressing   Problem: Coping: Goal: Level of anxiety will decrease Outcome: Progressing   Problem: Elimination: Goal: Will not experience complications related to bowel motility Outcome: Progressing Goal: Will not experience complications related to urinary retention Outcome: Progressing   Problem: Pain Managment: Goal: General experience of comfort will improve Outcome: Progressing   Problem: Safety: Goal: Ability to remain free from injury will improve Outcome: Progressing   Problem: Skin Integrity: Goal: Risk for impaired skin integrity will decrease Outcome: Progressing

## 2023-06-26 ENCOUNTER — Inpatient Hospital Stay (HOSPITAL_COMMUNITY)
Admit: 2023-06-26 | Discharge: 2023-06-26 | Disposition: A | Discharge status: Home / Self Care | Payer: Medicare Other | Attending: Family Medicine | Admitting: Family Medicine

## 2023-06-26 ENCOUNTER — Inpatient Hospital Stay: Payer: Medicare Other

## 2023-06-26 ENCOUNTER — Other Ambulatory Visit: Payer: Self-pay | Admitting: Student

## 2023-06-26 DIAGNOSIS — R180 Malignant ascites: Secondary | ICD-10-CM | POA: Diagnosis not present

## 2023-06-26 DIAGNOSIS — C569 Malignant neoplasm of unspecified ovary: Secondary | ICD-10-CM | POA: Diagnosis not present

## 2023-06-26 DIAGNOSIS — I5031 Acute diastolic (congestive) heart failure: Secondary | ICD-10-CM | POA: Diagnosis not present

## 2023-06-26 DIAGNOSIS — R188 Other ascites: Secondary | ICD-10-CM

## 2023-06-26 DIAGNOSIS — E8770 Fluid overload, unspecified: Secondary | ICD-10-CM

## 2023-06-26 DIAGNOSIS — Z86718 Personal history of other venous thrombosis and embolism: Secondary | ICD-10-CM | POA: Diagnosis not present

## 2023-06-26 DIAGNOSIS — R0609 Other forms of dyspnea: Secondary | ICD-10-CM | POA: Diagnosis not present

## 2023-06-26 LAB — BASIC METABOLIC PANEL
Anion gap: 9 (ref 5–15)
BUN: 25 mg/dL — ABNORMAL HIGH (ref 8–23)
CO2: 26 mmol/L (ref 22–32)
Calcium: 8.1 mg/dL — ABNORMAL LOW (ref 8.9–10.3)
Chloride: 102 mmol/L (ref 98–111)
Creatinine, Ser: 0.66 mg/dL (ref 0.44–1.00)
GFR, Estimated: >60 mL/min (ref >60)
Glucose, Bld: 106 mg/dL — ABNORMAL HIGH (ref 70–99)
Potassium: 3.6 mmol/L (ref 3.5–5.1)
Sodium: 137 mmol/L (ref 135–145)

## 2023-06-26 LAB — ECHOCARDIOGRAM COMPLETE
AR max vel: 1.76 cm2
AV Area VTI: 1.89 cm2
AV Area mean vel: 1.69 cm2
AV Mean grad: 3 mmHg
AV Peak grad: 4.2 mmHg
Ao pk vel: 1.03 m/s
Area-P 1/2: 3.21 cm2
Height: 64 in
MV VTI: 1.69 cm2
S' Lateral: 2.3 cm
Weight: 2627.88 oz

## 2023-06-26 MED ORDER — TORSEMIDE 20 MG PO TABS: 20.0000 mg | ORAL_TABLET | Freq: Every day | ORAL | 1 refills | Status: DC

## 2023-06-26 NOTE — Assessment & Plan Note (Signed)
Congestive heart failure unlikely as BNP was normal at 50.  Mild lower extremity edema and some abdominal distention with ascites.  Likely related to her malignancy. She was started on IV diuresis and cardiology was also consulted. Echocardiogram normal-congestive heart failure ruled out. -Cardiology switched her to 20 mg daily torsemide, they also does not think it is related to cardiac pathology.

## 2023-06-26 NOTE — Progress Notes (Signed)
  Echocardiogram 2D Echocardiogram has been performed.  [name] [name] 06/26/2023, 9:31 AM

## 2023-06-26 NOTE — Discharge Summary (Signed)
MEDICATION: Allergies as of 06/26/2023       Reactions   Atorvastatin Other (See Comments)   Leg pain   Macrodantin Other (See Comments)   Unknown Reaction   Decongest-aid [pseudoephedrine] Palpitations        Medication List     STOP taking these  medications    benzonatate 100 MG capsule Commonly known as: Tessalon Perles   furosemide 20 MG tablet Commonly known as: Lasix   HYDROcodone-acetaminophen 7.5-325 MG tablet Commonly known as: NORCO       TAKE these medications    apixaban 5 MG Tabs tablet Commonly known as: ELIQUIS Take 1 tablet (5 mg total) by mouth 2 (two) times daily.   fish oil-omega-3 fatty acids 1000 MG capsule Take 1 g by mouth daily.   multivitamin with minerals Tabs tablet Take 1 tablet by mouth daily.   prochlorperazine 10 MG tablet Commonly known as: COMPAZINE Take 10 mg by mouth every 6 (six) hours as needed for nausea or vomiting.   Red Yeast Rice Extract 600 MG Caps Take 1 capsule by mouth daily.   rosuvastatin 5 MG tablet Commonly known as: Crestor Take 1 tablet (5 mg total) by mouth daily.   tamoxifen 20 MG tablet Commonly known as: NOLVADEX Take 1 tablet (20 mg total) by mouth daily.   torsemide 20 MG tablet Commonly known as: DEMADEX Take 1 tablet (20 mg total) by mouth daily. Start taking on: June 27, 2023   Vitamin D (Ergocalciferol) 1.25 MG (50000 UNIT) Caps capsule Commonly known as: DRISDOL Take 1 capsule (50,000 Units total) by mouth every 7 (seven) days.        Follow-up Information     [facility]. Go in 3 day(s).   Why: You are scheduled for ultrasound guided paracentesis at [facility] on Wednesday 06/29/23 9 am. Please come to the hospital 15 minutes early to check in. Contact information: [address] [place] [address]        Eden Emms, NP. Schedule an appointment as soon as possible for a visit in 1 week(s).   Specialties: Nurse Practitioner, Family Medicine Contact information: [address] [place] [address] [phone]                Discharge Exam: Ceasar Mons Weights   06/24/23 2059 06/25/23 0506 06/26/23 0434  Weight: 77.1 kg 76.6 kg 74.5 kg   General.  Frail  elderly lady, in no acute distress. Pulmonary.  Lungs clear bilaterally, normal respiratory effort. CV.  Regular rate and rhythm, no JVD, rub or murmur. Abdomen.  Soft, nontender, distended, BS positive. CNS.  Alert and oriented .  No focal neurologic deficit. Extremities.  Trace LE edema, no cyanosis, pulses intact and symmetrical. Psychiatry.  Judgment and insight appears normal.   Condition at discharge: stable  The results of significant diagnostics from this hospitalization (including imaging, microbiology, ancillary and laboratory) are listed below for reference.   Imaging Studies: ECHOCARDIOGRAM COMPLETE  Result Date: 06/26/2023    ECHOCARDIOGRAM REPORT   Patient Name:   [name] [name] Date of Exam: 06/26/2023 Medical Rec #:  [id number]      Height:       64.0 in Accession #:    [id number]     Weight:       164.2 lb Date of Birth:  11/22/1950      BSA:          1.799 m Patient Age:  MEDICATION: Allergies as of 06/26/2023       Reactions   Atorvastatin Other (See Comments)   Leg pain   Macrodantin Other (See Comments)   Unknown Reaction   Decongest-aid [pseudoephedrine] Palpitations        Medication List     STOP taking these  medications    benzonatate 100 MG capsule Commonly known as: Tessalon Perles   furosemide 20 MG tablet Commonly known as: Lasix   HYDROcodone-acetaminophen 7.5-325 MG tablet Commonly known as: NORCO       TAKE these medications    apixaban 5 MG Tabs tablet Commonly known as: ELIQUIS Take 1 tablet (5 mg total) by mouth 2 (two) times daily.   fish oil-omega-3 fatty acids 1000 MG capsule Take 1 g by mouth daily.   multivitamin with minerals Tabs tablet Take 1 tablet by mouth daily.   prochlorperazine 10 MG tablet Commonly known as: COMPAZINE Take 10 mg by mouth every 6 (six) hours as needed for nausea or vomiting.   Red Yeast Rice Extract 600 MG Caps Take 1 capsule by mouth daily.   rosuvastatin 5 MG tablet Commonly known as: Crestor Take 1 tablet (5 mg total) by mouth daily.   tamoxifen 20 MG tablet Commonly known as: NOLVADEX Take 1 tablet (20 mg total) by mouth daily.   torsemide 20 MG tablet Commonly known as: DEMADEX Take 1 tablet (20 mg total) by mouth daily. Start taking on: June 27, 2023   Vitamin D (Ergocalciferol) 1.25 MG (50000 UNIT) Caps capsule Commonly known as: DRISDOL Take 1 capsule (50,000 Units total) by mouth every 7 (seven) days.        Follow-up Information     [facility]. Go in 3 day(s).   Why: You are scheduled for ultrasound guided paracentesis at [facility] on Wednesday 06/29/23 9 am. Please come to the hospital 15 minutes early to check in. Contact information: [address] [place] [address]        Eden Emms, NP. Schedule an appointment as soon as possible for a visit in 1 week(s).   Specialties: Nurse Practitioner, Family Medicine Contact information: [address] [place] [address] [phone]                Discharge Exam: Ceasar Mons Weights   06/24/23 2059 06/25/23 0506 06/26/23 0434  Weight: 77.1 kg 76.6 kg 74.5 kg   General.  Frail  elderly lady, in no acute distress. Pulmonary.  Lungs clear bilaterally, normal respiratory effort. CV.  Regular rate and rhythm, no JVD, rub or murmur. Abdomen.  Soft, nontender, distended, BS positive. CNS.  Alert and oriented .  No focal neurologic deficit. Extremities.  Trace LE edema, no cyanosis, pulses intact and symmetrical. Psychiatry.  Judgment and insight appears normal.   Condition at discharge: stable  The results of significant diagnostics from this hospitalization (including imaging, microbiology, ancillary and laboratory) are listed below for reference.   Imaging Studies: ECHOCARDIOGRAM COMPLETE  Result Date: 06/26/2023    ECHOCARDIOGRAM REPORT   Patient Name:   [name] [name] Date of Exam: 06/26/2023 Medical Rec #:  [id number]      Height:       64.0 in Accession #:    [id number]     Weight:       164.2 lb Date of Birth:  11/22/1950      BSA:          1.799 m Patient Age:  MEDICATION: Allergies as of 06/26/2023       Reactions   Atorvastatin Other (See Comments)   Leg pain   Macrodantin Other (See Comments)   Unknown Reaction   Decongest-aid [pseudoephedrine] Palpitations        Medication List     STOP taking these  medications    benzonatate 100 MG capsule Commonly known as: Tessalon Perles   furosemide 20 MG tablet Commonly known as: Lasix   HYDROcodone-acetaminophen 7.5-325 MG tablet Commonly known as: NORCO       TAKE these medications    apixaban 5 MG Tabs tablet Commonly known as: ELIQUIS Take 1 tablet (5 mg total) by mouth 2 (two) times daily.   fish oil-omega-3 fatty acids 1000 MG capsule Take 1 g by mouth daily.   multivitamin with minerals Tabs tablet Take 1 tablet by mouth daily.   prochlorperazine 10 MG tablet Commonly known as: COMPAZINE Take 10 mg by mouth every 6 (six) hours as needed for nausea or vomiting.   Red Yeast Rice Extract 600 MG Caps Take 1 capsule by mouth daily.   rosuvastatin 5 MG tablet Commonly known as: Crestor Take 1 tablet (5 mg total) by mouth daily.   tamoxifen 20 MG tablet Commonly known as: NOLVADEX Take 1 tablet (20 mg total) by mouth daily.   torsemide 20 MG tablet Commonly known as: DEMADEX Take 1 tablet (20 mg total) by mouth daily. Start taking on: June 27, 2023   Vitamin D (Ergocalciferol) 1.25 MG (50000 UNIT) Caps capsule Commonly known as: DRISDOL Take 1 capsule (50,000 Units total) by mouth every 7 (seven) days.        Follow-up Information     [facility]. Go in 3 day(s).   Why: You are scheduled for ultrasound guided paracentesis at [facility] on Wednesday 06/29/23 9 am. Please come to the hospital 15 minutes early to check in. Contact information: [address] [place] [address]        Eden Emms, NP. Schedule an appointment as soon as possible for a visit in 1 week(s).   Specialties: Nurse Practitioner, Family Medicine Contact information: [address] [place] [address] [phone]                Discharge Exam: Ceasar Mons Weights   06/24/23 2059 06/25/23 0506 06/26/23 0434  Weight: 77.1 kg 76.6 kg 74.5 kg   General.  Frail  elderly lady, in no acute distress. Pulmonary.  Lungs clear bilaterally, normal respiratory effort. CV.  Regular rate and rhythm, no JVD, rub or murmur. Abdomen.  Soft, nontender, distended, BS positive. CNS.  Alert and oriented .  No focal neurologic deficit. Extremities.  Trace LE edema, no cyanosis, pulses intact and symmetrical. Psychiatry.  Judgment and insight appears normal.   Condition at discharge: stable  The results of significant diagnostics from this hospitalization (including imaging, microbiology, ancillary and laboratory) are listed below for reference.   Imaging Studies: ECHOCARDIOGRAM COMPLETE  Result Date: 06/26/2023    ECHOCARDIOGRAM REPORT   Patient Name:   [name] [name] Date of Exam: 06/26/2023 Medical Rec #:  [id number]      Height:       64.0 in Accession #:    [id number]     Weight:       164.2 lb Date of Birth:  11/22/1950      BSA:          1.799 m Patient Age:  MEDICATION: Allergies as of 06/26/2023       Reactions   Atorvastatin Other (See Comments)   Leg pain   Macrodantin Other (See Comments)   Unknown Reaction   Decongest-aid [pseudoephedrine] Palpitations        Medication List     STOP taking these  medications    benzonatate 100 MG capsule Commonly known as: Tessalon Perles   furosemide 20 MG tablet Commonly known as: Lasix   HYDROcodone-acetaminophen 7.5-325 MG tablet Commonly known as: NORCO       TAKE these medications    apixaban 5 MG Tabs tablet Commonly known as: ELIQUIS Take 1 tablet (5 mg total) by mouth 2 (two) times daily.   fish oil-omega-3 fatty acids 1000 MG capsule Take 1 g by mouth daily.   multivitamin with minerals Tabs tablet Take 1 tablet by mouth daily.   prochlorperazine 10 MG tablet Commonly known as: COMPAZINE Take 10 mg by mouth every 6 (six) hours as needed for nausea or vomiting.   Red Yeast Rice Extract 600 MG Caps Take 1 capsule by mouth daily.   rosuvastatin 5 MG tablet Commonly known as: Crestor Take 1 tablet (5 mg total) by mouth daily.   tamoxifen 20 MG tablet Commonly known as: NOLVADEX Take 1 tablet (20 mg total) by mouth daily.   torsemide 20 MG tablet Commonly known as: DEMADEX Take 1 tablet (20 mg total) by mouth daily. Start taking on: June 27, 2023   Vitamin D (Ergocalciferol) 1.25 MG (50000 UNIT) Caps capsule Commonly known as: DRISDOL Take 1 capsule (50,000 Units total) by mouth every 7 (seven) days.        Follow-up Information     [facility]. Go in 3 day(s).   Why: You are scheduled for ultrasound guided paracentesis at [facility] on Wednesday 06/29/23 9 am. Please come to the hospital 15 minutes early to check in. Contact information: [address] [place] [address]        Eden Emms, NP. Schedule an appointment as soon as possible for a visit in 1 week(s).   Specialties: Nurse Practitioner, Family Medicine Contact information: [address] [place] [address] [phone]                Discharge Exam: Ceasar Mons Weights   06/24/23 2059 06/25/23 0506 06/26/23 0434  Weight: 77.1 kg 76.6 kg 74.5 kg   General.  Frail  elderly lady, in no acute distress. Pulmonary.  Lungs clear bilaterally, normal respiratory effort. CV.  Regular rate and rhythm, no JVD, rub or murmur. Abdomen.  Soft, nontender, distended, BS positive. CNS.  Alert and oriented .  No focal neurologic deficit. Extremities.  Trace LE edema, no cyanosis, pulses intact and symmetrical. Psychiatry.  Judgment and insight appears normal.   Condition at discharge: stable  The results of significant diagnostics from this hospitalization (including imaging, microbiology, ancillary and laboratory) are listed below for reference.   Imaging Studies: ECHOCARDIOGRAM COMPLETE  Result Date: 06/26/2023    ECHOCARDIOGRAM REPORT   Patient Name:   [name] [name] Date of Exam: 06/26/2023 Medical Rec #:  [id number]      Height:       64.0 in Accession #:    [id number]     Weight:       164.2 lb Date of Birth:  11/22/1950      BSA:          1.799 m Patient Age:  Physician Discharge Summary   Patient: [name] [name] MRN: [id number] DOB: Jul 09, 1951  Admit date:     06/24/2023  Discharge date: 06/26/23  Discharge Physician: Arnetha Courser   PCP: Eden Emms, NP   Recommendations at discharge:  Please obtain CBC and BMP on follow-up Follow-up with interventional radiology for paracentesis Follow-up with oncology Follow-up with PCP  Discharge Diagnoses: Principal Problem:   Acute diastolic CHF (congestive heart failure) (HCC) Active Problems:   Ovarian cancer (HCC)   History of DVT of lower extremity   Dyslipidemia   Dyspnea on exertion   Hypervolemia   Hospital Course: Taken from H&P.  SUSZANNE BRAKEFIELD is a 72 y.o. Caucasian female with medical history significant for anxiety, depression, ovarian cancer, status post salpingo-oophorectomy and radiotherapy, rheumatoid arthritis, right leg DVT, on Eliquis, MVP, who presented to the emergency room with complaint of worsening dyspnea on exertion with associated worsening lower extremity edema that started a couple of days when she came to the emergency room and was ruled out DVT and had a CT showing ascites.  She was given a prescription for Lasix which she has been taking.  She has not noticed increased urine output and improvement of her symptoms.   On arrival to ED ER, BP 150/97, HR 109, labs with BUN 28, CBC and rest of CMP normal. CXR with stable focal atelectasis in the lingula and large hiatal hernia.  CTA chest on 9/19 with no PE, did show bibasal atelectasis, ascites and peritoneal implants consistent with her known diagnosis of ovarian cancer with peritoneal carcinomatosis and aortic atherosclerosis.  Patient was given 40 mg of IV Lasix and started on IV diuresis.  9/21: Vital normal, BNP normal at 50, troponin negative.  Per chart review worsening CA125 on 05/31/2023.  Echocardiogram pending.  9/22: Remained hemodynamically stable.  Slight worsening of abdominal distention due to  ascites, patient wants to have paracentesis done but unfortunately due to staffing issues over the weekend we were unable to obtain it.  An outpatient appointment was made for Wednesday after discussing with patient.  Patient likely has malignant ascites and might need frequent paracentesis which can be arranged by her oncologist.  Echocardiogram with no significant abnormality.  Patient was started on torsemide 20 mg daily which she will continue on discharge.  Patient will continue on current medications and need to have a close follow-up with her providers for further recommendations.  Assessment and Plan: * Acute diastolic CHF (congestive heart failure) (HCC)  Congestive heart failure unlikely as BNP was normal at 50.  Mild lower extremity edema and some abdominal distention with ascites.  Likely related to her malignancy. She was started on IV diuresis and cardiology was also consulted. Echocardiogram normal-congestive heart failure ruled out. -Cardiology switched her to 20 mg daily torsemide, they also does not think it is related to cardiac pathology.    Ovarian cancer (HCC) - She has peritoneal carcinomatosis, likely the culprit for her ascites. - We will continue tamoxifen. - She is status post salpingo-oophorectomy and radiotherapy.  History of DVT of lower extremity - Continue Eliquis.  Dyslipidemia - We will continue statin therapy.   Consultants: Cardiology Procedures performed: None Disposition: Home Diet recommendation:  Discharge Diet Orders (From admission, onward)     Start     Ordered   06/26/23 0000  Diet - low sodium heart healthy        06/26/23 1338           Cardiac diet DISCHARGE  MEDICATION: Allergies as of 06/26/2023       Reactions   Atorvastatin Other (See Comments)   Leg pain   Macrodantin Other (See Comments)   Unknown Reaction   Decongest-aid [pseudoephedrine] Palpitations        Medication List     STOP taking these  medications    benzonatate 100 MG capsule Commonly known as: Tessalon Perles   furosemide 20 MG tablet Commonly known as: Lasix   HYDROcodone-acetaminophen 7.5-325 MG tablet Commonly known as: NORCO       TAKE these medications    apixaban 5 MG Tabs tablet Commonly known as: ELIQUIS Take 1 tablet (5 mg total) by mouth 2 (two) times daily.   fish oil-omega-3 fatty acids 1000 MG capsule Take 1 g by mouth daily.   multivitamin with minerals Tabs tablet Take 1 tablet by mouth daily.   prochlorperazine 10 MG tablet Commonly known as: COMPAZINE Take 10 mg by mouth every 6 (six) hours as needed for nausea or vomiting.   Red Yeast Rice Extract 600 MG Caps Take 1 capsule by mouth daily.   rosuvastatin 5 MG tablet Commonly known as: Crestor Take 1 tablet (5 mg total) by mouth daily.   tamoxifen 20 MG tablet Commonly known as: NOLVADEX Take 1 tablet (20 mg total) by mouth daily.   torsemide 20 MG tablet Commonly known as: DEMADEX Take 1 tablet (20 mg total) by mouth daily. Start taking on: June 27, 2023   Vitamin D (Ergocalciferol) 1.25 MG (50000 UNIT) Caps capsule Commonly known as: DRISDOL Take 1 capsule (50,000 Units total) by mouth every 7 (seven) days.        Follow-up Information     [facility]. Go in 3 day(s).   Why: You are scheduled for ultrasound guided paracentesis at [facility] on Wednesday 06/29/23 9 am. Please come to the hospital 15 minutes early to check in. Contact information: [address] [place] [address]        Eden Emms, NP. Schedule an appointment as soon as possible for a visit in 1 week(s).   Specialties: Nurse Practitioner, Family Medicine Contact information: [address] [place] [address] [phone]                Discharge Exam: Ceasar Mons Weights   06/24/23 2059 06/25/23 0506 06/26/23 0434  Weight: 77.1 kg 76.6 kg 74.5 kg   General.  Frail  elderly lady, in no acute distress. Pulmonary.  Lungs clear bilaterally, normal respiratory effort. CV.  Regular rate and rhythm, no JVD, rub or murmur. Abdomen.  Soft, nontender, distended, BS positive. CNS.  Alert and oriented .  No focal neurologic deficit. Extremities.  Trace LE edema, no cyanosis, pulses intact and symmetrical. Psychiatry.  Judgment and insight appears normal.   Condition at discharge: stable  The results of significant diagnostics from this hospitalization (including imaging, microbiology, ancillary and laboratory) are listed below for reference.   Imaging Studies: ECHOCARDIOGRAM COMPLETE  Result Date: 06/26/2023    ECHOCARDIOGRAM REPORT   Patient Name:   [name] [name] Date of Exam: 06/26/2023 Medical Rec #:  [id number]      Height:       64.0 in Accession #:    [id number]     Weight:       164.2 lb Date of Birth:  11/22/1950      BSA:          1.799 m Patient Age:  Physician Discharge Summary   Patient: [name] [name] MRN: [id number] DOB: Jul 09, 1951  Admit date:     06/24/2023  Discharge date: 06/26/23  Discharge Physician: Arnetha Courser   PCP: Eden Emms, NP   Recommendations at discharge:  Please obtain CBC and BMP on follow-up Follow-up with interventional radiology for paracentesis Follow-up with oncology Follow-up with PCP  Discharge Diagnoses: Principal Problem:   Acute diastolic CHF (congestive heart failure) (HCC) Active Problems:   Ovarian cancer (HCC)   History of DVT of lower extremity   Dyslipidemia   Dyspnea on exertion   Hypervolemia   Hospital Course: Taken from H&P.  SUSZANNE BRAKEFIELD is a 72 y.o. Caucasian female with medical history significant for anxiety, depression, ovarian cancer, status post salpingo-oophorectomy and radiotherapy, rheumatoid arthritis, right leg DVT, on Eliquis, MVP, who presented to the emergency room with complaint of worsening dyspnea on exertion with associated worsening lower extremity edema that started a couple of days when she came to the emergency room and was ruled out DVT and had a CT showing ascites.  She was given a prescription for Lasix which she has been taking.  She has not noticed increased urine output and improvement of her symptoms.   On arrival to ED ER, BP 150/97, HR 109, labs with BUN 28, CBC and rest of CMP normal. CXR with stable focal atelectasis in the lingula and large hiatal hernia.  CTA chest on 9/19 with no PE, did show bibasal atelectasis, ascites and peritoneal implants consistent with her known diagnosis of ovarian cancer with peritoneal carcinomatosis and aortic atherosclerosis.  Patient was given 40 mg of IV Lasix and started on IV diuresis.  9/21: Vital normal, BNP normal at 50, troponin negative.  Per chart review worsening CA125 on 05/31/2023.  Echocardiogram pending.  9/22: Remained hemodynamically stable.  Slight worsening of abdominal distention due to  ascites, patient wants to have paracentesis done but unfortunately due to staffing issues over the weekend we were unable to obtain it.  An outpatient appointment was made for Wednesday after discussing with patient.  Patient likely has malignant ascites and might need frequent paracentesis which can be arranged by her oncologist.  Echocardiogram with no significant abnormality.  Patient was started on torsemide 20 mg daily which she will continue on discharge.  Patient will continue on current medications and need to have a close follow-up with her providers for further recommendations.  Assessment and Plan: * Acute diastolic CHF (congestive heart failure) (HCC)  Congestive heart failure unlikely as BNP was normal at 50.  Mild lower extremity edema and some abdominal distention with ascites.  Likely related to her malignancy. She was started on IV diuresis and cardiology was also consulted. Echocardiogram normal-congestive heart failure ruled out. -Cardiology switched her to 20 mg daily torsemide, they also does not think it is related to cardiac pathology.    Ovarian cancer (HCC) - She has peritoneal carcinomatosis, likely the culprit for her ascites. - We will continue tamoxifen. - She is status post salpingo-oophorectomy and radiotherapy.  History of DVT of lower extremity - Continue Eliquis.  Dyslipidemia - We will continue statin therapy.   Consultants: Cardiology Procedures performed: None Disposition: Home Diet recommendation:  Discharge Diet Orders (From admission, onward)     Start     Ordered   06/26/23 0000  Diet - low sodium heart healthy        06/26/23 1338           Cardiac diet DISCHARGE

## 2023-06-26 NOTE — Progress Notes (Signed)
Cardiology Progress Note  Patient ID: PAULEEN DANTON MRN: [id number] DOB: 1951/01/21 Date of Encounter: 06/26/2023  Primary Cardiologist: None  Subjective   Chief Complaint: Abdominal distention  HPI: Echo performed.  Normal LV function.  Normal diastolic parameters.  Still with significant ascites.  Edema is noncardiac.  ROS:  All other ROS reviewed and negative. Pertinent positives noted in the HPI.     Inpatient Medications  Scheduled Meds:  apixaban  5 mg Oral BID   multivitamin with minerals  1 tablet Oral Daily   omega-3 acid ethyl esters  1 g Oral Daily   rosuvastatin  5 mg Oral Daily   tamoxifen  20 mg Oral Daily   torsemide  20 mg Oral Daily   Continuous Infusions:  PRN Meds: acetaminophen **OR** acetaminophen, magnesium hydroxide, ondansetron **OR** ondansetron (ZOFRAN) IV, traZODone   Vital Signs   Vitals:   06/26/23 0824 06/26/23 0825 06/26/23 1153 06/26/23 1153  BP: 117/76 117/76 111/74 111/74  Pulse: 74 74 81 81  Resp: 16 16 17 17   Temp: 97.8 F (36.6 C) 97.8 F (36.6 C) 98.4 F (36.9 C) 98.4 F (36.9 C)  TempSrc:      SpO2: 94% 95% 94% 94%  Weight:      Height:        Intake/Output Summary (Last 24 hours) at 06/26/2023 1227 Last data filed at 06/26/2023 0434 Gross per 24 hour  Intake 240 ml  Output 1400 ml  Net -1160 ml      06/26/2023    4:34 AM 06/25/2023    5:06 AM 06/24/2023    8:59 PM  Last 3 Weights  Weight (lbs) 164 lb 3.9 oz 168 lb 14 oz 170 lb  Weight (kg) 74.5 kg 76.6 kg 77.111 kg      Telemetry  Overnight telemetry shows sinus rhythm 70s, which I personally reviewed.   Physical Exam   Vitals:   06/26/23 0824 06/26/23 0825 06/26/23 1153 06/26/23 1153  BP: 117/76 117/76 111/74 111/74  Pulse: 74 74 81 81  Resp: 16 16 17 17   Temp: 97.8 F (36.6 C) 97.8 F (36.6 C) 98.4 F (36.9 C) 98.4 F (36.9 C)  TempSrc:      SpO2: 94% 95% 94% 94%  Weight:      Height:        Intake/Output Summary (Last 24 hours) at 06/26/2023  1227 Last data filed at 06/26/2023 0434 Gross per 24 hour  Intake 240 ml  Output 1400 ml  Net -1160 ml       06/26/2023    4:34 AM 06/25/2023    5:06 AM 06/24/2023    8:59 PM  Last 3 Weights  Weight (lbs) 164 lb 3.9 oz 168 lb 14 oz 170 lb  Weight (kg) 74.5 kg 76.6 kg 77.111 kg    Body mass index is 28.19 kg/m.  General: Well nourished, well developed, in no acute distress Head: Atraumatic, normal size  Eyes: PEERLA, EOMI  Neck: Supple, no JVD Endocrine: No thryomegaly Cardiac: Normal S1, S2; RRR; no murmurs, rubs, or gallops Lungs: Clear to auscultation bilaterally, no wheezing, rhonchi or rales  Abd: Distended abdomen Ext: No edema, pulses 2+ Musculoskeletal: No deformities, BUE and BLE strength normal and equal Skin: Warm and dry, no rashes   Neuro: Alert and oriented to person, place, time, and situation, CNII-XII grossly intact, no focal deficits  Psych: Normal mood and affect   Labs  High Sensitivity Troponin:   Recent Labs  Lab 06/22/23 2013  06/23/23 0138  TROPONINIHS 3 3     Cardiac EnzymesNo results for input(s): "TROPONINI" in the last 168 hours. No results for input(s): "TROPIPOC" in the last 168 hours.  Chemistry Recent Labs  Lab 06/22/23 2013 06/24/23 2103 06/25/23 0932 06/26/23 0600  NA 139 139 137 137  K 4.5 3.7 3.4* 3.6  CL 104 104 102 102  CO2 25 23 27 26   GLUCOSE 113* 122* 109* 106*  BUN 25* 28* 24* 25*  CREATININE 0.71 0.94 0.84 0.66  CALCIUM 8.9 8.9 8.3* 8.1*  PROT 6.7 7.1  --   --   ALBUMIN 3.6 3.6  --   --   AST 27 28  --   --   ALT 22 20  --   --   ALKPHOS 34* 34*  --   --   BILITOT 0.6 0.5  --   --   GFRNONAA >60 >60 >60 >60  ANIONGAP 10 12 8 9     Hematology Recent Labs  Lab 06/22/23 2013 06/24/23 2103  WBC 8.0 8.7  RBC 4.34 4.15  HGB 13.9 13.2  HCT 43.0 41.4  MCV 99.1 99.8  MCH 32.0 31.8  MCHC 32.3 31.9  RDW 14.1 13.9  PLT 306 297   BNP Recent Labs  Lab 06/22/23 2013 06/24/23 2103  BNP 40.0 50.2    DDimer No  results for input(s): "DDIMER" in the last 168 hours.   Radiology  ECHOCARDIOGRAM COMPLETE  Result Date: 06/26/2023    ECHOCARDIOGRAM REPORT   Patient Name:   JARELYN SCHETTINO Date of Exam: 06/26/2023 Medical Rec #:  [id number]      Height:       64.0 in Accession #:    [id number]     Weight:       164.2 lb Date of Birth:  07-22-1951      BSA:          1.799 m Patient Age:    72 years       BP:           116/77 mmHg Patient Gender: F              HR:           70 bpm. Exam Location:  [facility] Procedure: 2D Echo, Cardiac Doppler and Color Doppler Indications:     CHF-Acute Diastolic I50.31  History:         Patient has no prior history of Echocardiogram examinations.                  CAD. MVP.  Sonographer:     Neysa Bonito Roar Referring Phys:  [id number] Vernetta Honey MANSY Diagnosing Phys: Lennie Odor MD IMPRESSIONS  1. Left ventricular ejection fraction, by estimation, is 55 to 60%. The left ventricle has normal function. The left ventricle has no regional wall motion abnormalities. Left ventricular diastolic parameters were normal.  2. Right ventricular systolic function is normal. The right ventricular size is normal. There is normal pulmonary artery systolic pressure. The estimated right ventricular systolic pressure is 23.8 mmHg.  3. The mitral valve is grossly normal. Trivial mitral valve regurgitation. No evidence of mitral stenosis.  4. The aortic valve is tricuspid. Aortic valve regurgitation is not visualized. No aortic stenosis is present.  5. The inferior vena cava is normal in size with greater than 50% respiratory variability, suggesting right atrial pressure of 3 mmHg. Conclusion(s)/Recommendation(s): Normal biventricular function without evidence of hemodynamically significant valvular heart disease. FINDINGS  Left  Ventricle: Left ventricular ejection fraction, by estimation, is 55 to 60%. The left ventricle has normal function. The left ventricle has no regional wall motion abnormalities. The left ventricular  internal cavity size was normal in size. There is  no left ventricular hypertrophy. Left ventricular diastolic parameters were normal. Right Ventricle: The right ventricular size is normal. No increase in right ventricular wall thickness. Right ventricular systolic function is normal. There is normal pulmonary artery systolic pressure. The tricuspid regurgitant velocity is 2.28 m/s, and  with an assumed right atrial pressure of 3 mmHg, the estimated right ventricular systolic pressure is 23.8 mmHg. Left Atrium: Left atrial size was normal in size. Right Atrium: Right atrial size was normal in size. Pericardium: There is no evidence of pericardial effusion. Presence of epicardial fat layer. Mitral Valve: The mitral valve is grossly normal. Trivial mitral valve regurgitation. No evidence of mitral valve stenosis. MV peak gradient, 3.5 mmHg. The mean mitral valve gradient is 1.0 mmHg. Tricuspid Valve: The tricuspid valve is grossly normal. Tricuspid valve regurgitation is trivial. No evidence of tricuspid stenosis. Aortic Valve: The aortic valve is tricuspid. Aortic valve regurgitation is not visualized. No aortic stenosis is present. Aortic valve mean gradient measures 3.0 mmHg. Aortic valve peak gradient measures 4.2 mmHg. Aortic valve area, by VTI measures 1.89 cm. Pulmonic Valve: The pulmonic valve was grossly normal. Pulmonic valve regurgitation is not visualized. No evidence of pulmonic stenosis. Aorta: The aortic root and ascending aorta are structurally normal, with no evidence of dilitation. Venous: The inferior vena cava is normal in size with greater than 50% respiratory variability, suggesting right atrial pressure of 3 mmHg. IAS/Shunts: The atrial septum is grossly normal. Additional Comments: Mild ascites is present.  LEFT VENTRICLE PLAX 2D LVIDd:         3.20 cm   Diastology LVIDs:         2.30 cm   LV e' medial:    7.29 cm/s LV PW:         0.80 cm   LV E/e' medial:  4.7 LV IVS:        1.10 cm   LV e'  lateral:   10.70 cm/s LVOT diam:     1.70 cm   LV E/e' lateral: 3.2 LV SV:         38 LV SV Index:   21 LVOT Area:     2.27 cm  RIGHT VENTRICLE RV Basal diam:  3.10 cm RV Mid diam:    2.90 cm RV S prime:     13.40 cm/s TAPSE (M-mode): 2.2 cm LEFT ATRIUM             Index        RIGHT ATRIUM           Index LA diam:        2.80 cm 1.56 cm/m   RA Area:     10.20 cm LA Vol (A2C):   36.5 ml 20.29 ml/m  RA Volume:   18.90 ml  10.50 ml/m LA Vol (A4C):   24.5 ml 13.62 ml/m LA Biplane Vol: 30.8 ml 17.12 ml/m  AORTIC VALVE                    PULMONIC VALVE AV Area (Vmax):    1.76 cm     PV Vmax:        1.01 m/s AV Area (Vmean):   1.69 cm     PV Peak grad:   4.1  mmHg AV Area (VTI):     1.89 cm     RVOT Peak grad: 2 mmHg AV Vmax:           103.00 cm/s AV Vmean:          74.100 cm/s AV VTI:            0.199 m AV Peak Grad:      4.2 mmHg AV Mean Grad:      3.0 mmHg LVOT Vmax:         79.80 cm/s LVOT Vmean:        55.100 cm/s LVOT VTI:          0.166 m LVOT/AV VTI ratio: 0.83  AORTA Ao Root diam: 2.70 cm Ao Asc diam:  2.80 cm MITRAL VALVE               TRICUSPID VALVE MV Area (PHT): 3.21 cm    TR Peak grad:   20.8 mmHg MV Area VTI:   1.69 cm    TR Vmax:        228.00 cm/s MV Peak grad:  3.5 mmHg MV Mean grad:  1.0 mmHg    SHUNTS MV Vmax:       0.93 m/s    Systemic VTI:  0.17 m MV Vmean:      48.1 cm/s   Systemic Diam: 1.70 cm MV Decel Time: 236 msec MV E velocity: 34.60 cm/s MV A velocity: 88.00 cm/s MV E/A ratio:  0.39 Lennie Odor MD Electronically signed by Lennie Odor MD Signature Date/Time: 06/26/2023/10:31:16 AM    Final    DG Chest Portable 1 View  Result Date: 06/24/2023 CLINICAL DATA:  Shortness of breath EXAM: PORTABLE CHEST 1 VIEW COMPARISON:  Chest x-ray 05/04/2023.  CT chest 06/23/2023. FINDINGS: Right chest port catheter tip ends in the SVC. The cardiomediastinal silhouette is within normal limits. Large hiatal hernia is again seen. There is a small focal band of opacity in the left lower lung,  unchanged from prior. Otherwise, the lungs appear clear. There is no pleural effusion or pneumothorax. No acute fractures are seen. IMPRESSION: 1. Stable focal area of atelectasis in the lingula. 2. Large hiatal hernia. Electronically Signed   By: Darliss Cheney M.D.   On: 06/24/2023 23:42    Cardiac Studies  TTE 06/26/2023  1. Left ventricular ejection fraction, by estimation, is 55 to 60%. The  left ventricle has normal function. The left ventricle has no regional  wall motion abnormalities. Left ventricular diastolic parameters were  normal.   2. Right ventricular systolic function is normal. The right ventricular  size is normal. There is normal pulmonary artery systolic pressure. The  estimated right ventricular systolic pressure is 23.8 mmHg.   3. The mitral valve is grossly normal. Trivial mitral valve  regurgitation. No evidence of mitral stenosis.   4. The aortic valve is tricuspid. Aortic valve regurgitation is not  visualized. No aortic stenosis is present.   5. The inferior vena cava is normal in size with greater than 50%  respiratory variability, suggesting right atrial pressure of 3 mmHg.   Patient Profile  SHAKIEA RENDELL is a 72 y.o. female with ovarian cancer with known peritoneal carcinomatosis admitted on 06/24/2023 for lower extremity edema.  Assessment & Plan   # Lower extremity edema # Ascites # Stage III ovarian cancer with peritoneal carcinomatosis -Admitted with ascites related to ovarian cancer and lower extremity edema.  BNP 50 which is normal.  EKG normal.  No JVD.  Echocardiogram shows normal LV function with normal diastolic parameters.  She does have significant ascites which was even seen on her echocardiogram. -Overall suspect this is noncardiac edema.  I think this is related to her malignancy.  I recommended compression stockings.  She may end up needing paracentesis.  We discussed that her edema is just related to her cancer and likely venous compression.   Would recommend transition to torsemide 20 mg daily.  She will watch her salt intake.  She does not appear to have cardiology needs.  [facility] will sign off.   Medication Recommendations: As above Other recommendations (labs, testing, etc): None Follow up as an outpatient: Not needed  For questions or updates, please contact [facility] Please consult [email or web address] for contact info under    Signed, Gerri Spore T. Flora Lipps, MD, [facility]  [facility]  06/26/2023 12:27 PM

## 2023-06-26 NOTE — Plan of Care (Signed)
Care plan complete

## 2023-06-27 ENCOUNTER — Ambulatory Visit: Payer: Medicare Other | Admitting: Cardiology

## 2023-06-27 ENCOUNTER — Telehealth: Payer: Self-pay

## 2023-06-27 ENCOUNTER — Ambulatory Visit: Payer: Medicare Other | Admitting: Podiatry

## 2023-06-27 NOTE — Transitions of Care (Post Inpatient/ED Visit) (Signed)
06/27/2023  Name: [name] [name] MRN: [id number] DOB: Feb 14, 1951  Today's TOC FU Call Status: TOC FU Call Complete Date: 06/27/23 Patient's Name and Date of Birth confirmed.  Transition Care Management Follow-up Telephone Call Date of Discharge: 06/26/23 Discharge Facility: [facility]) Type of Discharge: Inpatient Admission Primary Inpatient Discharge Diagnosis:: fluid overload How have you been since you were released from the hospital?: Better Any questions or concerns?: No  Items Reviewed: Did you receive and understand the discharge instructions provided?: Yes Medications obtained,verified, and reconciled?: Yes (Medications Reviewed) Any new allergies since your discharge?: No Dietary orders reviewed?: Yes Do you have support at home?: Yes People in Home: spouse  Medications Reviewed Today: Medications Reviewed Today     Reviewed by Karena Addison, LPN (Licensed Practical Nurse) on 06/27/23 at 1157  Med List Status: <None>   Medication Order Taking? Sig Documenting Provider Last Dose Status Informant  apixaban (ELIQUIS) 5 MG TABS tablet [id number] No Take 1 tablet (5 mg total) by mouth 2 (two) times daily. [name], MD 06/24/2023 0900 Active Self  fish oil-omega-3 fatty acids 1000 MG capsule [id number] No Take 1 g by mouth daily. [provider] 06/24/2023 Active Self, Pharmacy Records  Multiple Vitamin (MULTIVITAMIN WITH MINERALS) TABS [id number] No Take 1 tablet by mouth daily. [provider] 06/24/2023 Active Self, Pharmacy Records  prochlorperazine (COMPAZINE) 10 MG tablet [id number] No Take 10 mg by mouth every 6 (six) hours as needed for nausea or vomiting. [provider] prn prn Active Self  Red Yeast Rice Extract 600 MG CAPS [id number] No Take 1 capsule by mouth daily. [provider] 06/24/2023 Active Self, Pharmacy Records  rosuvastatin (CRESTOR) 5 MG tablet [id number] No Take 1 tablet (5 mg total) by  mouth daily. Eden Emms, NP 06/24/2023 Active Self  tamoxifen (NOLVADEX) 20 MG tablet [id number] No Take 1 tablet (20 mg total) by mouth daily. [name], MD 06/24/2023 Active Self  torsemide (DEMADEX) 20 MG tablet [id number]  Take 1 tablet (20 mg total) by mouth daily. Arnetha Courser, MD  Active   Vitamin D, Ergocalciferol, (DRISDOL) 1.25 MG (50000 UNIT) CAPS capsule [id number] No Take 1 capsule (50,000 Units total) by mouth every 7 (seven) days. Eden Emms, NP Unknown Active Self           Med Note Rosalio Macadamia   AYT Jun 25, 2023  2:28 AM) Pt taking thursdays            Home Care and Equipment/Supplies: Were Home Health Services Ordered?: NA Any new equipment or medical supplies ordered?: NA  Functional Questionnaire: Do you need assistance with bathing/showering or dressing?: No Do you need assistance with meal preparation?: No Do you need assistance with eating?: No Do you have difficulty maintaining continence: No Do you need assistance with getting out of bed/getting out of a chair/moving?: No Do you have difficulty managing or taking your medications?: No  Follow up appointments reviewed: PCP Follow-up appointment confirmed?: Yes Date of PCP follow-up appointment?: 06/28/23 Follow-up Provider: [facility] Specialist Hospital Follow-up appointment confirmed?: Yes Date of Specialist follow-up appointment?: 06/27/23 Follow-Up Specialty Provider:: cardio Do you need transportation to your follow-up appointment?: No Do you understand care options if your condition(s) worsen?: Yes-patient verbalized understanding    SIGNATURE Karena Addison, LPN [facility] Nurse Health Advisor Direct Dial [phone]

## 2023-06-27 NOTE — Progress Notes (Deleted)
Cardiology Office Note:    Date:  06/27/2023   ID:  [name] [name], [name] [name] October 02, 1951, MRN [id number]  PCP:  [name] Emms, NP   [facility] Providers Cardiologist:  None { Click [name] update primary MD,subspecialty MD or APP then REFRESH:1}    Referring MD: [name] Emms, NP   No chief complaint on file. ***  History of Present Illness:    [name] [name] is a 72 y.o. female with a hx of ***  Past Medical History:  Diagnosis Date   Anxiety and depression     Ascites    Chest pain    a. 09/2004 MV: No isch/infarct.   DJD (degenerative joint disease)    Elevated cholesterol    Endometrial polyp    Family history of bladder cancer    Family history of lymphoma    History of radiation therapy 10/25/16-12/01/16   right pelvis/60.2 Gy in 28 fractions   IRRITABLE BOWEL SYNDROME, HX OF 05/15/2008   Kidney stone    Lower extremity edema    MVP (mitral valve prolapse)    occ palpitations    Ovarian ca (HCC) 07/2012       Rheumatoid arthritis(714.0) ~ 2010   dr Dareen Piano   Right leg DVT (HCC)    a. 11/2022 following R hip fx/surgery-->eliquis.   Shingles     Past Surgical History:  Procedure Laterality Date   ABDOMINAL HYSTERECTOMY  08/22/2012   Procedure: HYSTERECTOMY ABDOMINAL;  Surgeon: Jeannette Corpus, MD;  Location: [facility];  Service: Gynecology;  Laterality: N/A;   ANTERIOR APPROACH HEMI HIP ARTHROPLASTY Right 11/03/2022   Procedure: ANTERIOR APPROACH HEMI HIP ARTHROPLASTY;  Surgeon: Lyndle Herrlich, MD;  Location: [facility];  Service: Orthopedics;  Laterality: Right;   BACK SURGERY  10/04/2006   Dr Dutch Quint   COLONOSCOPY  04/20/2004   COLONOSCOPY W/ BIOPSIES  07/04/2014   COLOSTOMY REVISION  08/22/2012   Procedure: COLON RESECTION SIGMOID;  Surgeon: Jeannette Corpus, MD;  Location: [facility];  Service: Gynecology;;  Rectal Sigmoid resection and low rectal anastomosis   CYST ON NECK  10/04/2009   DEBULKING  08/22/2012   Procedure: DEBULKING;  Surgeon:  Jeannette Corpus, MD;  Location: [facility];  Service: Gynecology;  Laterality: N/A;  Radical tumor debulking, Bilateral Ureterolysis   DILATION AND CURETTAGE OF UTERUS  10/04/2002   WITH HYSTEROSCOPY   HAND SURGERY  10/04/1994   HYSTEROSCOPY  10/04/2002   D&C   IR IMAGING GUIDED PORT INSERTION  09/01/2021   KIDNEY STONE SURGERY Right    with stent placement  Mar 03 2021   NECK SURGERY  10/05/2007   SPURS   OMENTECTOMY  08/22/2012   Procedure: OMENTECTOMY;  Surgeon: Jeannette Corpus, MD;  Location: [facility];  Service: Gynecology;  Laterality: N/A;   SALPINGOOPHORECTOMY  08/22/2012   Procedure: SALPINGO OOPHORECTOMY;  Surgeon: Jeannette Corpus, MD;  Location: [facility];  Service: Gynecology;  Laterality: Bilateral;   TUBAL LIGATION  10/04/1978    Current Medications: No outpatient medications have been marked as taking for the 06/27/23 encounter (Appointment) with [place], Emiyah Spraggins M, MD.     Allergies:   Atorvastatin, Macrodantin, and Decongest-aid [pseudoephedrine]   Social History   Socioeconomic History   Marital status: Legally Separated    Spouse name: Not on file   Number of children: 2   Years of education: Not on file   Highest education level: Not on file  Occupational History   Occupation: disability  d/t RA  Tobacco Use   Smoking status: Never   Smokeless tobacco: Never  Vaping Use   Vaping status: Never Used  Substance and Sexual Activity   Alcohol use: No   Drug use: No   Sexual activity: Not Currently    Birth control/protection: Post-menopausal, Surgical  Other Topics Concern   Not on file  Social History Narrative   [name] [name] is her daughter    Household-- pr and husband   Social Determinants of Health   Financial Resource Strain: Low Risk  (05/04/2023)   Overall Financial Resource Strain (CARDIA)    Difficulty of Paying Living Expenses: Not hard at all  Food Insecurity: No Food Insecurity (06/25/2023)   Hunger Vital Sign    Worried  About Running Out of Food in the Last Year: Never true    Ran Out of Food in the Last Year: Never true  Transportation Needs: No Transportation Needs (06/25/2023)   PRAPARE - Administrator, Civil Service (Medical): No    Lack of Transportation (Non-Medical): No  Physical Activity: Insufficiently Active (05/04/2023)   Exercise Vital Sign    Days of Exercise per Week: 4 days    Minutes of Exercise per Session: 30 min  Stress: No Stress Concern Present (05/04/2023)   [employer] of Occupational Health - Occupational Stress Questionnaire    Feeling of Stress : Not at all  Social Connections: Moderately Isolated (05/04/2023)   Social Connection and Isolation Panel [NHANES]    Frequency of Communication with Friends and Family: More than three times a week    Frequency of Social Gatherings with Friends and Family: More than three times a week    Attends Religious Services: More than 4 times per year    Active Member of [employer] or Organizations: No    Attends Banker Meetings: Never    Marital Status: Separated     Family History: The patient's ***family history includes Bladder Cancer (age of onset: 52) in her father; Heart disease in her mother; Lymphoma in her paternal aunt; Osteoporosis in her sister. There is no history of Colon cancer, Rectal cancer, Stomach cancer, Diabetes, Stroke, or Colon polyps.  ROS:   Please see the history of present illness.    *** All other systems reviewed and are negative.  EKGs/Labs/Other Studies Reviewed:    The following studies were reviewed today: ***      Recent Labs: 05/11/2023: Pro B Natriuretic peptide (BNP) 53.0; TSH 2.95 06/24/2023: ALT 20; B Natriuretic Peptide 50.2; Hemoglobin 13.2; Platelets 297 06/26/2023: BUN 25; Creatinine, Ser 0.66; Potassium 3.6; Sodium 137  Recent Lipid Panel    Component Value Date/Time   CHOL 225 (H) 05/11/2023 1601   TRIG 214.0 (H) 05/11/2023 1601   HDL 39.80 05/11/2023 1601    CHOLHDL 6 05/11/2023 1601   VLDL 42.8 (H) 05/11/2023 1601   LDLCALC 140 (H) 07/07/2021 1202   LDLDIRECT 161.0 05/11/2023 1601     Risk Assessment/Calculations:   {Does this patient have ATRIAL FIBRILLATION?:[id number]}            Physical Exam:    VS:  There were no vitals taken for this visit.    Wt Readings from Last 3 Encounters:  06/26/23 164 lb 3.9 oz (74.5 kg)  06/22/23 160 lb (72.6 kg)  06/22/23 158 lb 15.2 oz (72.1 kg)     GEN: *** Well nourished, well developed in no acute distress HEENT: Normal NECK: No JVD; No carotid bruits LYMPHATICS:  No lymphadenopathy CARDIAC: ***RRR, no murmurs, rubs, gallops RESPIRATORY:  Clear [name] auscultation without rales, wheezing or rhonchi  ABDOMEN: Soft, non-tender, non-distended MUSCULOSKELETAL:  No edema; No deformity  SKIN: Warm and dry NEUROLOGIC:  Alert and oriented x 3 PSYCHIATRIC:  Normal affect   ASSESSMENT:    No diagnosis found. PLAN:    In order of problems listed above:  ***      {Are you ordering a CV Procedure (e.g. stress test, cath, DCCV, TEE, etc)?   Press F2        :[id number]}    Medication Adjustments/Labs and Tests Ordered: Current medicines are reviewed at length with the patient today.  Concerns regarding medicines are outlined above.  No orders of the defined types were placed in this encounter.  No orders of the defined types were placed in this encounter.   There are no Patient Instructions on file for this visit.   Signed, Nelton Amsden [place], MD  06/27/2023 12:19 PM    [facility]

## 2023-06-27 NOTE — Telephone Encounter (Signed)
Noted  

## 2023-06-28 ENCOUNTER — Ambulatory Visit (INDEPENDENT_AMBULATORY_CARE_PROVIDER_SITE_OTHER): Payer: Medicare Other | Admitting: Nurse Practitioner

## 2023-06-28 ENCOUNTER — Encounter: Payer: Self-pay | Admitting: Nurse Practitioner

## 2023-06-28 VITALS — BP 122/80 | HR 85 | Temp 97.9°F | Ht 64.0 in | Wt 165.6 lb

## 2023-06-28 DIAGNOSIS — C482 Malignant neoplasm of peritoneum, unspecified: Secondary | ICD-10-CM | POA: Diagnosis not present

## 2023-06-28 DIAGNOSIS — R600 Localized edema: Secondary | ICD-10-CM | POA: Diagnosis not present

## 2023-06-28 DIAGNOSIS — Z09 Encounter for follow-up examination after completed treatment for conditions other than malignant neoplasm: Secondary | ICD-10-CM

## 2023-06-28 DIAGNOSIS — C569 Malignant neoplasm of unspecified ovary: Secondary | ICD-10-CM | POA: Diagnosis not present

## 2023-06-28 DIAGNOSIS — R180 Malignant ascites: Secondary | ICD-10-CM

## 2023-06-28 NOTE — Patient Instructions (Signed)
Nice to see you today I want to see you on 07/08/2023, we will do labs at that visit I want you to pick up the torsemide and start taking it as prescribed

## 2023-06-28 NOTE — Progress Notes (Signed)
Established Patient Office Visit  Subjective   Patient ID: [name] [name], female    DOB: 06-04-1951  Age: 72 y.o. MRN: [id number]  Chief Complaint  Patient presents with   hospitalilzation follow up    Pt complains of bloating and feeling tired and states she is scheduled to get more fluid drawn out of stomach.     HPI  Hospital follow up: patient went to the emergency department on 06/24/2023.  She came in with the complaint of dyspnea on exertion and occasional orthopnea with no history of CHF.  Patient noticed increased weight gain as well she had been seen 2 days prior to that in the emergency department for similar symptoms.  During the first emergency department visit patient had a CT of the chest which showed no evidence of PE but did show ascites on her abdomen.  Patient was offered IV diuresis but declined and was placed on p.o. Lasix.  Patient was admitted  Diagnosis of acute diastolic heart failure she is post to follow-up with interventional radiology for paracentesis and keep follow-up with oncology.  They did an echocardiogram and ruled out acute heart failure they do think the ascites is secondary to her malignancy.  She is switched to torsemide 20 mg daily   Review of Systems  Constitutional:  Negative for chills and fever.  Respiratory:  Negative for shortness of breath.   Cardiovascular:  Positive for leg swelling. Negative for chest pain.  Gastrointestinal:  Negative for abdominal pain, nausea and vomiting.      Objective:     BP 122/80   Pulse 85   Temp 97.9 F (36.6 C) (Temporal)   Ht 5\' 4"  (1.626 m)   Wt 165 lb 9.6 oz (75.1 kg)   SpO2 90%   BMI 28.43 kg/m    Physical Exam Vitals and nursing note reviewed.  Constitutional:      Appearance: Normal appearance.  Cardiovascular:     Rate and Rhythm: Normal rate and regular rhythm.     Heart sounds: Normal heart sounds.  Pulmonary:     Effort: Pulmonary effort is normal.     Breath sounds: Normal  breath sounds.  Abdominal:     General: Bowel sounds are normal. There is distension.     Palpations: There is no mass.     Tenderness: There is no abdominal tenderness.     Hernia: No hernia is present.  Musculoskeletal:     Right lower leg: 1+ Edema present.     Left lower leg: 2+ Edema present.  Neurological:     Mental Status: She is alert.      No results found for any visits on 06/28/23.    The 10-year ASCVD risk score (Arnett DK, et al., 2019) is: 12.4%    Assessment & Plan:   Problem List Items Addressed This Visit       Other   Hospital discharge follow-up    Reviewed ED and inpatient note.  Labs and imaging.  Patient is concerned patient was going to cruise next month and does not want to be stranded in another country requiring medical attention.  While close follow-up pending paracentesis      Lower extremity edema    Still present take diuretic as recommended      Malignant ascites - Primary    Likely secondary to malignancy.  Patient underwent echocardiogram which was negative as interventional radiology appointment tomorrow for paracentesis.  Encourage patient to pick up torsemide and  take as prescribed       Return in about 10 days (around 07/08/2023) for Ascites/labs/lipids .    Audria Nine, NP

## 2023-06-28 NOTE — Assessment & Plan Note (Signed)
Still present take diuretic as recommended

## 2023-06-28 NOTE — Assessment & Plan Note (Signed)
Likely secondary to malignancy.  Patient underwent echocardiogram which was negative as interventional radiology appointment tomorrow for paracentesis.  Encourage patient to pick up torsemide and take as prescribed

## 2023-06-28 NOTE — Assessment & Plan Note (Signed)
Reviewed ED and inpatient note.  Labs and imaging.  Patient is concerned patient was going to cruise next month and does not want to be stranded in another country requiring medical attention.  While close follow-up pending paracentesis

## 2023-06-29 ENCOUNTER — Ambulatory Visit
Admission: RE | Admit: 2023-06-29 | Discharge: 2023-06-29 | Disposition: A | Discharge status: Home / Self Care | Payer: Medicare Other | Source: Ambulatory Visit | Attending: Student | Admitting: Student

## 2023-06-29 DIAGNOSIS — R188 Other ascites: Secondary | ICD-10-CM | POA: Insufficient documentation

## 2023-06-29 MED ORDER — LIDOCAINE HCL (PF) 1 % IJ SOLN
10.0000 mL | Freq: Once | INTRAMUSCULAR | Status: AC
  Administered 2023-06-29: 10 mL via INTRADERMAL
  Filled 2023-06-29: qty 10

## 2023-06-29 NOTE — Procedures (Signed)
PROCEDURE SUMMARY:  Successful image-guided paracentesis from the right lower abdomen.  Yielded 4.5 liters of amber fluid.  No immediate complications.  EBL = trace. Patient tolerated well.   Specimen was not sent for labs.  Please see imaging section of Epic for full dictation.   Kennieth Francois PA-C 06/29/2023 1:02 PM

## 2023-07-02 ENCOUNTER — Other Ambulatory Visit: Payer: Self-pay | Admitting: Nurse Practitioner

## 2023-07-02 DIAGNOSIS — E785 Hyperlipidemia, unspecified: Secondary | ICD-10-CM

## 2023-07-04 ENCOUNTER — Telehealth: Payer: Self-pay | Admitting: *Deleted

## 2023-07-04 NOTE — Transitions of Care (Post Inpatient/ED Visit) (Signed)
07/04/2023  Name: [name] [name] MRN: [id number] DOB: 05-16-1951  Today's TOC FU Call Status: Today's TOC FU Call Status:: Unsuccessful Call (1st Attempt) Unsuccessful Call (1st Attempt) Date: 07/04/23  Attempted to reach the patient regarding the most recent Inpatient/ED visit.  Follow Up Plan: Additional outreach attempts will be made to reach the patient to complete the Transitions of Care (Post Inpatient/ED visit) call.   Gean Maidens BSN RN [facility] [phone]

## 2023-07-05 ENCOUNTER — Encounter: Payer: Self-pay | Admitting: Oncology

## 2023-07-05 ENCOUNTER — Other Ambulatory Visit (HOSPITAL_BASED_OUTPATIENT_CLINIC_OR_DEPARTMENT_OTHER): Payer: Self-pay

## 2023-07-07 ENCOUNTER — Telehealth: Payer: Self-pay | Admitting: *Deleted

## 2023-07-07 NOTE — Transitions of Care (Post Inpatient/ED Visit) (Signed)
07/07/2023  Name: [name] [name] MRN: [id number] DOB: November 09, 1950  Today's TOC FU Call Status: Today's TOC FU Call Status:: Successful TOC FU Call Completed TOC FU Call Complete Date: 07/07/23 Patient's Name and Date of Birth confirmed.  Transition Care Management Follow-up Telephone Call Date of Discharge: 06/26/23 Discharge Facility: [facility]) Type of Discharge: Inpatient Admission Primary Inpatient Discharge Diagnosis:: Acute diastolic CHF in setting of ascites/ paracentesis from ovarian CA How have you been since you were released from the hospital?: Better ("I am doing better; when I went after the hospital visit and had the fluid removed, I felt so much better.  I am checking my weights at home every day like they told me to and taking the new fluid pill.  Looking forward to my cruise next week") Any questions or concerns?: No  Items Reviewed: Did you receive and understand the discharge instructions provided?: Yes (thoroughly reviewed with patient who verbalizes good understanding of same) Medications obtained,verified, and reconciled?: Yes (Medications Reviewed) (Full medication reconciliation/ review completed; no concerns or discrepancies identified; confirmed patient obtained/ is taking all newly Rx'd medications as instructed; self-manages medications and denies questions/ concerns around medications today) Any new allergies since your discharge?: No Dietary orders reviewed?: Yes Type of Diet Ordered:: Low salt, "as healthy as I can, lost of vegetables and fruit" Do you have support at home?: Yes People in Home: child(ren), adult Name of Support/Comfort Primary Source: Reports independent in self-care activities; resides with supportive adult son-- assists as/ if needed/ indicated  Medications Reviewed Today: Medications Reviewed Today     Reviewed by Michaela Corner, RN (Registered Nurse) on 07/07/23 at 1604  Med List Status: <None>    Medication Order Taking? Sig Documenting Provider Last Dose Status Informant  apixaban (ELIQUIS) 5 MG TABS tablet [id number] Yes Take 1 tablet (5 mg total) by mouth 2 (two) times daily. [name], MD Taking Active Self           Med Note Jonnie Kind Jul 07, 2023  3:39 PM) 07/07/23- Reports today that she occasionally only takes one time a day-- provided education and she said she would "try to" start taking consistently twice daily  fish oil-omega-3 fatty acids 1000 MG capsule [id number] Yes Take 1 g by mouth daily. [provider] Taking Active Self, Pharmacy Records  furosemide (LASIX) 20 MG tablet [id number] No Take 20 mg by mouth.  Patient not taking: Reported on 07/07/2023   [provider] Not Taking Active            Med Note Jonnie Kind Jul 07, 2023  3:42 PM) 07/07/23: Reports during TOC she is no longer taking-- as per hospital discharge instructions on 06/26/23  Multiple Vitamin (MULTIVITAMIN WITH MINERALS) TABS [id number] No Take 1 tablet by mouth daily.  Patient not taking: Reported on 07/07/2023   [provider] Not Taking Consider Medication Status and Discontinue Self, Pharmacy Records           Med Note Jonnie Kind Jul 07, 2023  3:41 PM) 07/07/23: Reports she is taking this medication as prescribed  prochlorperazine (COMPAZINE) 10 MG tablet [id number] Yes Take 10 mg by mouth every 6 (six) hours as needed for nausea or vomiting. [provider] Taking Active Self           Med Note Jonnie Kind Jul 07, 2023  3:51 PM) 07/07/23:  Reports during TOC call has not needed recently  Red Yeast Rice Extract 600 MG CAPS [id number] Yes Take 1 capsule by mouth daily. [provider] Taking Active Self, Pharmacy Records  rosuvastatin (CRESTOR) 5 MG tablet [id number] Yes Take 1 tablet by mouth once daily Eden Emms, NP Taking Active   tamoxifen (NOLVADEX) 20 MG tablet [id number] Yes Take 1 tablet (20 mg total) by  mouth daily. [name], MD Taking Active Self  torsemide (DEMADEX) 20 MG tablet [id number] Yes Take 1 tablet (20 mg total) by mouth daily. Arnetha Courser, MD Taking Active   Vitamin D, Ergocalciferol, (DRISDOL) 1.25 MG (50000 UNIT) CAPS capsule [id number] No Take 1 capsule (50,000 Units total) by mouth every 7 (seven) days.  Patient not taking: Reported on 07/07/2023   Eden Emms, NP Not Taking Active Self           Med Note Jonnie Kind Jul 07, 2023  3:50 PM) 07/07/23: Reports during TOC she has not been taking this consistently- reports she does have it at her home            Home Care and Equipment/Supplies: Were Home Health Services Ordered?: No Any new equipment or medical supplies ordered?: No  Functional Questionnaire: Do you need assistance with bathing/showering or dressing?: No Do you need assistance with meal preparation?: No Do you need assistance with eating?: No Do you have difficulty maintaining continence: No Do you need assistance with getting out of bed/getting out of a chair/moving?: No Do you have difficulty managing or taking your medications?: No  Follow up appointments reviewed: PCP Follow-up appointment confirmed?: Yes Date of PCP follow-up appointment?: 06/28/23 Follow-up Provider: PCP Specialist Hospital Follow-up appointment confirmed?: Yes Date of Specialist follow-up appointment?: 06/29/23 Follow-Up Specialty Provider:: IR- for paracentesis Do you need transportation to your follow-up appointment?: No Do you understand care options if your condition(s) worsen?: Yes-patient verbalized understanding  SDOH Interventions Today    Flowsheet Row Most Recent Value  SDOH Interventions   Food Insecurity Interventions Intervention Not Indicated  Transportation Interventions Intervention Not Indicated  [drives self]      TOC Interventions Today    Flowsheet Row Most Recent Value  TOC Interventions   TOC Interventions Discussed/Reviewed  TOC Interventions Discussed, Contacted provider for patient needs  [Patient declines need for ongoing/ further care management/ coordination outreach,  no needs identified at time of TOC call today]      Interventions Today    Flowsheet Row Most Recent Value  Chronic Disease   Chronic disease during today's visit Other, Congestive Heart Failure (CHF)  [ascites- possibly related to ovarian CA]  General Interventions   General Interventions Discussed/Reviewed General Interventions Discussed, Durable Medical Equipment (DME), Doctor Visits  Doctor Visits Discussed/Reviewed Doctor Visits Discussed, Doctor Visits Reviewed, PCP, Specialist  [Reviewed recent PCP HFU office visit on 06/28/23]  Durable Medical Equipment (DME) Other  [scales, confirmed not currently requiring/ using assistive devices]  PCP/Specialist Visits Compliance with follow-up visit  Exercise Interventions   Exercise Discussed/Reviewed Exercise Discussed  Education Interventions   Education Provided Provided Education  [benefit of conservative post-hospital discharge activity,  need to pace activity without over-doing,   rationale for daily weight monitoring at home along with weight gain guidelines/ action plan for weight gain,  importance of taking diuretic as prescribed]  Provided Verbal Education On Nutrition  Nutrition Interventions   Nutrition Discussed/Reviewed Nutrition Discussed  [Heart Healthy Low salt diet]  Pharmacy Interventions   Pharmacy  Dicussed/Reviewed Pharmacy Topics Discussed, Medications and their functions, Medication Adherence  [provided education around importance/ rationale for taking Eliquis BID as prescribed,   Full medication review with updating medication list in EHR per patient report]  Safety Interventions   Safety Discussed/Reviewed Safety Discussed  [importance of handwashing wheile she is out/ on upcoming cruise]      Caryl Pina, RN, BSN, CCRN Alumnus RN Care Manager  Transitions  of Care  [facility] - [facility] [phone]: direct office

## 2023-07-08 ENCOUNTER — Other Ambulatory Visit: Payer: Medicare Other

## 2023-07-08 ENCOUNTER — Ambulatory Visit (INDEPENDENT_AMBULATORY_CARE_PROVIDER_SITE_OTHER): Payer: Medicare Other | Admitting: Nurse Practitioner

## 2023-07-08 ENCOUNTER — Encounter: Payer: Self-pay | Admitting: Nurse Practitioner

## 2023-07-08 VITALS — BP 110/72 | HR 87 | Temp 98.1°F | Ht 64.0 in | Wt 149.0 lb

## 2023-07-08 DIAGNOSIS — R180 Malignant ascites: Secondary | ICD-10-CM

## 2023-07-08 DIAGNOSIS — C569 Malignant neoplasm of unspecified ovary: Secondary | ICD-10-CM | POA: Diagnosis not present

## 2023-07-08 DIAGNOSIS — R600 Localized edema: Secondary | ICD-10-CM | POA: Diagnosis not present

## 2023-07-08 NOTE — Progress Notes (Signed)
Established Patient Office Visit  Subjective   Patient ID: [name] [name], female    DOB: 11/10/1950  Age: 72 y.o. MRN: [id number]  Chief Complaint  Patient presents with   Follow-up    Pt states that she is feeling good, states that she has been doing yard work.     HPI  Malignant acites: Patient was seen by me on 06/28/2023.  She was seen in the hospital and admitted for hypovolemia and ascites.  Patient was put on diuretics to pull some of the fluid off with very little improvement.  Patient was scheduled to have a paracentesis on 06/29/2023.  Patient was switched from furosemide to torsemide 20 mg.  They did do an echocardiogram that ruled out acute heart failure.  She has been followed by oncology in the past for ovarian cancer still following with them.  Patient was concerned as she supposed to go on a cruise and did not want to be stuck in the country and have problems.  Patient went for the paracentesis and states she tolerated it well.  States she feels a lot better after having that fluid off of her.  States that her insurance company had a nurse call her and go over precautions.  She is waiting daily to monitor her weight she started wearing compression socks down for the lower extremity edema.  Patient states she is been taking 1 fluid pill a day but it does cause urinary urgency and caused her to have episodes of incontinence and feels like is too strong.  She would like to take half a pill a day if possible    Review of Systems  Constitutional:  Negative for chills and fever.  Respiratory:  Negative for shortness of breath.   Cardiovascular:  Negative for chest pain.  Gastrointestinal:  Positive for diarrhea. Negative for abdominal pain, nausea and vomiting.      Objective:     BP 110/72   Pulse 87   Temp 98.1 F (36.7 C) (Oral)   Ht 5\' 4"  (1.626 m)   Wt 149 lb (67.6 kg)   SpO2 95%   BMI 25.58 kg/m  BP Readings from Last 3 Encounters:  07/08/23 110/72  06/29/23  119/70  06/28/23 122/80   Wt Readings from Last 3 Encounters:  07/08/23 149 lb (67.6 kg)  06/28/23 165 lb 9.6 oz (75.1 kg)  06/26/23 164 lb 3.9 oz (74.5 kg)      Physical Exam Vitals and nursing note reviewed.  Constitutional:      Appearance: Normal appearance.  Cardiovascular:     Rate and Rhythm: Normal rate and regular rhythm.     Heart sounds: Normal heart sounds.  Pulmonary:     Effort: Pulmonary effort is normal.     Breath sounds: Normal breath sounds.  Abdominal:     General: Bowel sounds are normal. There is no distension.     Palpations: There is no mass.     Tenderness: There is no abdominal tenderness.     Hernia: No hernia is present.  Musculoskeletal:     Right lower leg: Edema (trace) present.     Left lower leg: Edema (trace) present.  Neurological:     Mental Status: She is alert.      No results found for any visits on 07/08/23.    The 10-year ASCVD risk score (Arnett DK, et al., 2019) is: 10.3%    Assessment & Plan:   Problem List Items Addressed This Visit  Endocrine   Ovarian cancer [facility]) - Primary    Patient is followed by Dr. Alcide Evener.  Continue following with oncology as recommended continue taking medication as prescribed        Other   Lower extremity edema    Improved since undergoing paracentesis.  Patient is taking torsemide daily we will have to 10 mg daily patient wear compression socks.  Continue.  Weigh yourself daily      Relevant Orders   BASIC METABOLIC PANEL WITH GFR   Malignant ascites    Improved status post paracentesis.  Continue following with oncology as recommended      Relevant Orders   BASIC METABOLIC PANEL WITH GFR    Return in about 3 months (around 10/08/2023) for Ascites, Edema, cholesterol .    Audria Nine, NP

## 2023-07-08 NOTE — Assessment & Plan Note (Signed)
Patient is followed by Dr. Alcide Evener.  Continue following with oncology as recommended continue taking medication as prescribed

## 2023-07-08 NOTE — Patient Instructions (Addendum)
It is ok to take half of the fluid pill Weight your self daily. If you start gaining weight or have increased swelling in the legs go back to a whole fluid pill Follow up with me in 3 months for a recheck and we will check your cholesterol

## 2023-07-08 NOTE — Assessment & Plan Note (Signed)
Improved since undergoing paracentesis.  Patient is taking torsemide daily we will have to 10 mg daily patient wear compression socks.  Continue.  Weigh yourself daily

## 2023-07-08 NOTE — Assessment & Plan Note (Signed)
Improved status post paracentesis.  Continue following with oncology as recommended

## 2023-07-09 LAB — BASIC METABOLIC PANEL WITH GFR
BUN/Creatinine Ratio: 29 (calc) — ABNORMAL HIGH (ref 6–22)
BUN: 28 mg/dL — ABNORMAL HIGH (ref 7–25)
CO2: 28 mmol/L (ref 20–32)
Calcium: 9.4 mg/dL (ref 8.6–10.4)
Chloride: 102 mmol/L (ref 98–110)
Creat: 0.96 mg/dL (ref 0.60–1.00)
Glucose, Bld: 140 mg/dL — ABNORMAL HIGH (ref 65–99)
Potassium: 3.8 mmol/L (ref 3.5–5.3)
Sodium: 141 mmol/L (ref 135–146)
eGFR: 62 mL/min/{1.73_m2} (ref >=60)

## 2023-07-14 ENCOUNTER — Encounter: Payer: Self-pay | Admitting: Oncology

## 2023-07-14 ENCOUNTER — Encounter: Payer: Self-pay | Admitting: Gynecologic Oncology

## 2023-07-14 ENCOUNTER — Other Ambulatory Visit: Payer: Self-pay

## 2023-07-14 ENCOUNTER — Inpatient Hospital Stay: Payer: Medicare Other | Attending: Oncology | Admitting: Gynecologic Oncology

## 2023-07-14 VITALS — BP 119/73 | HR 94 | Temp 98.1°F | Resp 18 | Wt 147.0 lb

## 2023-07-14 DIAGNOSIS — C786 Secondary malignant neoplasm of retroperitoneum and peritoneum: Secondary | ICD-10-CM | POA: Insufficient documentation

## 2023-07-14 DIAGNOSIS — Z90710 Acquired absence of both cervix and uterus: Secondary | ICD-10-CM | POA: Diagnosis not present

## 2023-07-14 DIAGNOSIS — G893 Neoplasm related pain (acute) (chronic): Secondary | ICD-10-CM | POA: Insufficient documentation

## 2023-07-14 DIAGNOSIS — Z9221 Personal history of antineoplastic chemotherapy: Secondary | ICD-10-CM | POA: Insufficient documentation

## 2023-07-14 DIAGNOSIS — Z923 Personal history of irradiation: Secondary | ICD-10-CM | POA: Diagnosis not present

## 2023-07-14 DIAGNOSIS — Z8543 Personal history of malignant neoplasm of ovary: Secondary | ICD-10-CM | POA: Diagnosis not present

## 2023-07-14 DIAGNOSIS — Z90722 Acquired absence of ovaries, bilateral: Secondary | ICD-10-CM | POA: Insufficient documentation

## 2023-07-14 DIAGNOSIS — Z79810 Long term (current) use of selective estrogen receptor modulators (SERMs): Secondary | ICD-10-CM | POA: Insufficient documentation

## 2023-07-14 DIAGNOSIS — C569 Malignant neoplasm of unspecified ovary: Secondary | ICD-10-CM

## 2023-07-14 DIAGNOSIS — R180 Malignant ascites: Secondary | ICD-10-CM | POA: Insufficient documentation

## 2023-07-14 DIAGNOSIS — R971 Elevated cancer antigen 125 [CA 125]: Secondary | ICD-10-CM | POA: Diagnosis not present

## 2023-07-14 NOTE — Progress Notes (Signed)
Requested Her2 testing on accession [id number] with [facility] via email.

## 2023-07-14 NOTE — Patient Instructions (Signed)
It was good to see you today.  I will check with pathology about the 1 test that I think we may still need to do on your tumor (nothing that you need to do).  Have a wonderful time on your cruise.  I will also send a message to Dr. Truett Perna about options for treatment moving forward.  We will plan to get you scheduled for a return visit to see me in 4 months.

## 2023-07-14 NOTE — Progress Notes (Signed)
Gynecologic Oncology Return Clinic Visit  07/14/23  Reason for Visit: Follow-up  Treatment History: Oncology History Overview Note  CA-125 10/10/113: 69 02/2013: 5.6, post adjuvant chemotherapy (03/2013-02/2016): 3.2-8.8 08/2016: 10.2, just prior to recurrence diagnosis 02/14/17: 7.1, 3 months post completion of RT 09/17/19: 6.5  In 2014, presented with pelvic pain and pressure, urinary frequency.  In 08/2016, presented with 3 months of constipation and RLQ pain. On exam, had some fullness and tenderness along right pelvic sidewall.    Ovarian cancer (HCC)  07/12/2012 Imaging   PET: significant soft tissue thickening seen throughout the greater omentum suspicious for intraperitoneal carcinoma. There is minimal ascites in the pelvis. it revealed the areas of omental and peritoneal nodularity were hypermetabolic. There is a dominant pelvic omental mass measuring 10.4 x 3.3 cm. There is a small omental implants in the left side of the abdomen measuring 1.1 cm. There were multiple pelvic foci That wereperitoneal based And hypermetabolic. These included the cul-de-sac. She has small volume of ascites.    07/24/2012 Initial Biopsy   CT-guided revealed metastatic carcinoma. By [facility], malignant cells were positive for WT 1, cytokeratin 7, estrogen receptor. There were negative for TTF-1, progesterone receptor, cytokeratin 20, CEA, CD X2, and S100. Overall, the histologic features were highly suggestive of a primary gynecologic origin.   08/22/2012 Surgery   Radical debulking - TAH/BSO, ureterolysis, omentectomy, stripping of pelvic peritoneum, omentectomy, and rectosigmoid resection with low rectal reanastomosis Findings: stage IIIC ovarian cancer. Patient was optimally debulked. Debulking, however, required a rectosigmoid resection as well as total omentectomy total nominal hysterectomy bilateral salpingo-oophorectomy. She was optimally debulked with only 5 mm nodules on the right diaphragm as  residual disease.    08/23/2012 Initial Diagnosis   Ovarian cancer (HCC)   09/2012 - 02/06/2013 Chemotherapy   6 cycles of carboplatin/taxol    03/29/2013 Imaging   CT C/A/P: 1.  Marked response to therapy of omental/peritoneal disease.  No residual measurable disease identified. 2.  Interval hysterectomy and bilateral oophrectomy.   08/25/2016 Imaging   CT A/P: 1. Solitary new enlarged right external iliac lymph node, suspicious for metastatic nodal recurrence. 2. No additional sites of metastatic disease in the abdomen or pelvis. 3. Aortic atherosclerosis.   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 There is malignant range FDG uptake associated with the enlarged right external iliac lymph node compatible with metastatic adenopathy. 2. No additional sites of metastatic disease identified. 3. Aortic atherosclerosis.   10/25/2016 - 12/01/2016 Radiation Therapy   Radiation to right pelvis treated to 60.2Gy in 28 fractions   03/28/2017 Imaging   PET post RT: 1. The previously enlarged and hypermetabolic right external iliac lymph node is currently of normal size and the previous hypermetabolic activity has resolved. No current active malignancy identified. 2. 2 mm right mid kidney nonobstructive renal calculus. 3.  Aortic Atherosclerosis (ICD10-I70.0).   09/23/2017 Imaging   CT A/P: No evidence of bowel obstruction.  Normal appendix. No CT findings to account for the patient's worsening right lower quadrant abdominal pain. No evidence of recurrent or metastatic disease.   10/31/2017 Imaging   PET: No evidence local ovarian carcinoma recurrence in the pelvis. No metastatic adenopathy or distant metastatic disease.   05/15/2018 Imaging   PET: 1. No evidence recurrent or metastatic disease. 2.  Aortic  atherosclerosis (ICD10-170.0).   09/19/2019  Imaging   CT A/P: Mild increase in size of several left upper quadrant peritoneal nodules measuring up to 11 mm, highly suspicious for increased peritoneal carcinomatosis. No other sites of metastatic disease identified. No evidence of ascites.   11/27/2019 Genetic Testing   CHEK2 p.[id number] VUS found on the cancerNext HRD testing.  The CancerNext gene panel offered by [employer] includes sequencing and rearrangement analysis for the following 34 genes:   APC, ATM, BARD1, BMPR1A, BRCA1, BRCA2, BRIP1, CDH1, CDK4, CDKN2A, CHEK2, DICER1, HOXB13, EPCAM, GREM1, MLH1, MRE11A, MSH2, MSH6, MUTYH, NBN, NF1, PALB2, PMS2, POLD1, POLE, PTEN, RAD50, RAD51C, RAD51D, SMAD4, SMARCA4, STK11, and TP53.  The report date is November 27, 2019.  HRD testing did not identify any pathogenic variants in the tumor.  Therefore HRD is neg.   09/09/2021 - 03/09/2022 Chemotherapy   Patient is on Treatment Plan : OVARIAN Paclitaxel / Carboplatin q7d     09/14/2022 - 11/30/2022 Chemotherapy   Patient is on Treatment Plan : BREAST Paclitaxel + Carboplatin q7d       Interval History: Patient reports overall doing better.  She was recently admitted for what was thought to be congestive heart failure but alternately was discovered to be related to ascites from her carcinomatosis.  She ultimately underwent paracentesis with 4 and half liters removed.  She feels significantly better.  She is having a much easier time breathing now and denies significant abdominal pain.  She is taking the diuretic but taking only a third a dose a day because if she takes even half a dose, she has urinary urgency and difficulty getting to the bathroom.  She continues to have occasional pain in her right pelvis, unchanged from before.  Notes appetite has improved, denies any nausea or emesis.  She is looking forward to going on a cruise next week.  She sees Dr. Truett Perna for follow-up after she  returns.  Past Medical/Surgical History: Past Medical History:  Diagnosis Date   Anxiety and depression     Ascites    Chest pain    a. 09/2004 MV: No isch/infarct.   DJD (degenerative joint disease)    Elevated cholesterol    Endometrial polyp    Family history of bladder cancer    Family history of lymphoma    History of radiation therapy 10/25/16-12/01/16   right pelvis/60.2 Gy in 28 fractions   IRRITABLE BOWEL SYNDROME, HX OF 05/15/2008   Kidney stone    Lower extremity edema    MVP (mitral valve prolapse)    occ palpitations    Ovarian ca (HCC) 07/2012       Rheumatoid arthritis(714.0) ~ 2010   dr Dareen Piano   Right leg DVT (HCC)    a. 11/2022 following R hip fx/surgery-->eliquis.   Shingles     Past Surgical History:  Procedure Laterality Date   ABDOMINAL HYSTERECTOMY  08/22/2012   Procedure: HYSTERECTOMY ABDOMINAL;  Surgeon: Jeannette Corpus, MD;  Location: [facility];  Service: Gynecology;  Laterality: N/A;   ANTERIOR APPROACH HEMI HIP ARTHROPLASTY Right 11/03/2022   Procedure: ANTERIOR APPROACH HEMI HIP ARTHROPLASTY;  Surgeon: Lyndle Herrlich, MD;  Location: [facility];  Service: Orthopedics;  Laterality: Right;   BACK SURGERY  10/04/2006   Dr Dutch Quint   COLONOSCOPY  04/20/2004   COLONOSCOPY W/ BIOPSIES  07/04/2014   COLOSTOMY REVISION  08/22/2012   Procedure: COLON RESECTION SIGMOID;  Surgeon: Jeannette Corpus, MD;  Location: [facility];  Service: Gynecology;;  Rectal Sigmoid resection and low rectal anastomosis  CYST ON NECK  10/04/2009   DEBULKING  08/22/2012   Procedure: DEBULKING;  Surgeon: Jeannette Corpus, MD;  Location: [facility];  Service: Gynecology;  Laterality: N/A;  Radical tumor debulking, Bilateral Ureterolysis   DILATION AND CURETTAGE OF UTERUS  10/04/2002   WITH HYSTEROSCOPY   HAND SURGERY  10/04/1994   HYSTEROSCOPY  10/04/2002   D&C   IR IMAGING GUIDED PORT INSERTION  09/01/2021   KIDNEY STONE SURGERY Right    with stent placement  Mar 03 2021   NECK SURGERY  10/05/2007   SPURS   OMENTECTOMY  08/22/2012   Procedure: OMENTECTOMY;  Surgeon: Jeannette Corpus, MD;  Location: [facility];  Service: Gynecology;  Laterality: N/A;   SALPINGOOPHORECTOMY  08/22/2012   Procedure: SALPINGO OOPHORECTOMY;  Surgeon: Jeannette Corpus, MD;  Location: [facility];  Service: Gynecology;  Laterality: Bilateral;   TUBAL LIGATION  10/04/1978    Family History  Problem Relation Age of Onset   Heart disease Mother    Bladder Cancer Father 55   Osteoporosis Sister    Lymphoma Paternal Aunt    Colon cancer Neg Hx    Rectal cancer Neg Hx    Stomach cancer Neg Hx    Diabetes Neg Hx    Stroke Neg Hx    Colon polyps Neg Hx     Social History   Socioeconomic History   Marital status: Legally Separated    Spouse name: Not on file   Number of children: 2   Years of education: Not on file   Highest education level: Not on file  Occupational History   Occupation: disability d/t RA  Tobacco Use   Smoking status: Never   Smokeless tobacco: Never  Vaping Use   Vaping status: Never Used  Substance and Sexual Activity   Alcohol use: No   Drug use: No   Sexual activity: Not Currently    Birth control/protection: Post-menopausal, Surgical  Other Topics Concern   Not on file  Social History Narrative   Florence Canner is her daughter    Household-- pr and husband   Social Determinants of Health   Financial Resource Strain: Low Risk  (05/04/2023)   Overall Financial Resource Strain (CARDIA)    Difficulty of Paying Living Expenses: Not hard at all  Food Insecurity: No Food Insecurity (07/07/2023)   Hunger Vital Sign    Worried About Running Out of Food in the Last Year: Never true    Ran Out of Food in the Last Year: Never true  Transportation Needs: No Transportation Needs (07/07/2023)   PRAPARE - Administrator, Civil Service (Medical): No    Lack of Transportation (Non-Medical): No  Physical Activity:  Insufficiently Active (05/04/2023)   Exercise Vital Sign    Days of Exercise per Week: 4 days    Minutes of Exercise per Session: 30 min  Stress: No Stress Concern Present (05/04/2023)   [employer] of Occupational Health - Occupational Stress Questionnaire    Feeling of Stress : Not at all  Social Connections: Moderately Isolated (05/04/2023)   Social Connection and Isolation Panel [NHANES]    Frequency of Communication with Friends and Family: More than three times a week    Frequency of Social Gatherings with Friends and Family: More than three times a week    Attends Religious Services: More than 4 times per year    Active Member of [employer] or Organizations: No    Attends Banker Meetings:  Never    Marital Status: Separated    Current Medications:  Current Outpatient Medications:    apixaban (ELIQUIS) 5 MG TABS tablet, Take 1 tablet (5 mg total) by mouth 2 (two) times daily., Disp: 180 tablet, Rfl: 1   fish oil-omega-3 fatty acids 1000 MG capsule, Take 1 g by mouth daily., Disp: , Rfl:    furosemide (LASIX) 20 MG tablet, Take 20 mg by mouth., Disp: , Rfl:    Multiple Vitamin (MULTIVITAMIN WITH MINERALS) TABS, Take 1 tablet by mouth daily., Disp: , Rfl:    prochlorperazine (COMPAZINE) 10 MG tablet, Take 10 mg by mouth every 6 (six) hours as needed for nausea or vomiting., Disp: , Rfl:    Red Yeast Rice Extract 600 MG CAPS, Take 1 capsule by mouth daily., Disp: , Rfl:    rosuvastatin (CRESTOR) 5 MG tablet, Take 1 tablet by mouth once daily, Disp: 90 tablet, Rfl: 0   tamoxifen (NOLVADEX) 20 MG tablet, Take 1 tablet (20 mg total) by mouth daily., Disp: 90 tablet, Rfl: 1   torsemide (DEMADEX) 20 MG tablet, Take 1 tablet (20 mg total) by mouth daily., Disp: 30 tablet, Rfl: 1   Vitamin D, Ergocalciferol, (DRISDOL) 1.25 MG (50000 UNIT) CAPS capsule, Take 1 capsule (50,000 Units total) by mouth every 7 (seven) days., Disp: 5 capsule, Rfl: 0  Review of Systems: Denies appetite  changes, fevers, chills, fatigue, unexplained weight changes. Denies hearing loss, neck lumps or masses, mouth sores, ringing in ears or voice changes. Denies cough or wheezing.  Denies shortness of breath. Denies chest pain or palpitations. Denies leg swelling. Denies abdominal distention, pain, blood in stools, constipation, diarrhea, nausea, vomiting, or early satiety. Denies pain with intercourse, dysuria, frequency, hematuria or incontinence. Denies hot flashes, pelvic pain, vaginal bleeding or vaginal discharge.   Denies joint pain, back pain or muscle pain/cramps. Denies itching, rash, or wounds. Denies dizziness, headaches, numbness or seizures. Denies swollen lymph nodes or glands, denies easy bruising or bleeding. Denies anxiety, depression, confusion, or decreased concentration.  Physical Exam: BP 119/73 (BP Location: Left Arm, Patient Position: Sitting)   Pulse 94   Temp 98.1 F (36.7 C) (Oral)   Resp 18   Wt 147 lb (66.7 kg)   SpO2 98%   BMI 25.23 kg/m  General: Alert, oriented, no acute distress. HEENT: Normocephalic, atraumatic, sclera anicteric. Chest: Clear to auscultation bilaterally.  No wheezes or rhonchi. Cardiovascular: Regular rate and rhythm, no murmurs. Abdomen: soft, mildly distended.  Normoactive bowel sounds.  No masses or hepatosplenomegaly appreciated.  + Fluid wave. Extremities: Grossly normal range of motion.  Warm, well perfused.  Trace edema bilaterally. Skin: No rashes or lesions noted. Lymphatics: No cervical, supraclavicular, or inguinal adenopathy. GU: Normal appearing external genitalia without erythema, excoriation, or lesions.  Speculum exam reveals cuff intact, no visible lesions.  On bimanual exam, small, less than 5 mm firm lesion along the right vaginal sidewall.  No pelvic masses appreciated or nodularity on bimanual or rectovaginal exam.  Laboratory & Radiologic Studies:          Component Ref Range & Units 1 mo ago 3 mo ago 4 mo ago  5 mo ago 6 mo ago 7 mo ago 8 mo ago  Cancer Antigen (CA) 125 0.0 - 38.1 U/mL 550.0 High  288.0 High  CM 553.0 High  CM 611.0 High  CM 63.8 High  CM 44.3 High  CM 50.2 High  CM   Assessment & Plan: [name] [name] is a  72 y.o. woman with recurrent platinum resistant ovarian cancer.  Had progression on platinum after restarted, most recently on Tamoxifen with increasing CA-125 and development of ascites.  Patient is overall doing well, symptoms significantly improved after paracentesis.  We spent some time today discussing why fluid is accumulating in her abdominal cavity and that this is related to cancer progression.  This is supported by her CA125 which is increasing.  While she may have some improvement of her lower extremity edema, we discussed that diuretic does not treat the underlying problem of ascites development.  She has follow-up with her medical oncologist after her upcoming cruise.  Plan is to get imaging at that time.  From a quality-of-life standpoint, she continues still to do quite well.  We discussed my recommendation to consider coming off of tamoxifen and restarted either traditional chemotherapy or targeted therapy.  Her numerous folate receptor positive.  It does not look like we ever did HER2 testing on her.  I have asked Clydie Braun, my nurse navigator, to reach out to pathology to have this added.  She is a candidate for mirvetuximab and would be good to know if she would also be a candidate for anti-HER2 therapy.  28 minutes of total time was spent for this patient encounter, including preparation, face-to-face counseling with the patient and coordination of care, and documentation of the encounter.  Eugene Garnet, MD  Division of Gynecologic Oncology  Department of Obstetrics and Gynecology  [facility]

## 2023-07-18 ENCOUNTER — Other Ambulatory Visit: Payer: Medicare Other

## 2023-07-18 ENCOUNTER — Ambulatory Visit: Payer: Medicare Other | Admitting: Oncology

## 2023-07-25 ENCOUNTER — Inpatient Hospital Stay: Payer: Medicare Other

## 2023-07-25 DIAGNOSIS — Z8543 Personal history of malignant neoplasm of ovary: Secondary | ICD-10-CM | POA: Diagnosis not present

## 2023-07-25 DIAGNOSIS — C786 Secondary malignant neoplasm of retroperitoneum and peritoneum: Secondary | ICD-10-CM | POA: Diagnosis not present

## 2023-07-25 DIAGNOSIS — R971 Elevated cancer antigen 125 [CA 125]: Secondary | ICD-10-CM | POA: Diagnosis not present

## 2023-07-25 DIAGNOSIS — Z90710 Acquired absence of both cervix and uterus: Secondary | ICD-10-CM | POA: Diagnosis not present

## 2023-07-25 DIAGNOSIS — Z79810 Long term (current) use of selective estrogen receptor modulators (SERMs): Secondary | ICD-10-CM | POA: Diagnosis not present

## 2023-07-25 DIAGNOSIS — Z90722 Acquired absence of ovaries, bilateral: Secondary | ICD-10-CM | POA: Diagnosis not present

## 2023-07-25 DIAGNOSIS — R180 Malignant ascites: Secondary | ICD-10-CM | POA: Diagnosis not present

## 2023-07-25 DIAGNOSIS — Z9221 Personal history of antineoplastic chemotherapy: Secondary | ICD-10-CM | POA: Diagnosis not present

## 2023-07-25 DIAGNOSIS — C569 Malignant neoplasm of unspecified ovary: Secondary | ICD-10-CM

## 2023-07-25 DIAGNOSIS — G893 Neoplasm related pain (acute) (chronic): Secondary | ICD-10-CM | POA: Diagnosis not present

## 2023-07-25 DIAGNOSIS — Z923 Personal history of irradiation: Secondary | ICD-10-CM | POA: Diagnosis not present

## 2023-07-25 DIAGNOSIS — Z95828 Presence of other vascular implants and grafts: Secondary | ICD-10-CM

## 2023-07-25 MED ORDER — SODIUM CHLORIDE 0.9% FLUSH
10.0000 mL | INTRAVENOUS | Status: DC | PRN
  Administered 2023-07-25: 10 mL

## 2023-07-25 MED ORDER — HEPARIN SOD (PORK) LOCK FLUSH 100 UNIT/ML IV SOLN
500.0000 [IU] | Freq: Once | INTRAVENOUS | Status: AC | PRN
  Administered 2023-07-25: 500 [IU]

## 2023-07-25 NOTE — Patient Instructions (Signed)

## 2023-07-26 ENCOUNTER — Encounter (HOSPITAL_COMMUNITY): Payer: Self-pay

## 2023-07-26 ENCOUNTER — Inpatient Hospital Stay: Payer: Medicare Other | Admitting: Oncology

## 2023-07-26 ENCOUNTER — Other Ambulatory Visit: Payer: Self-pay | Admitting: *Deleted

## 2023-07-26 ENCOUNTER — Inpatient Hospital Stay: Payer: Medicare Other

## 2023-07-26 VITALS — BP 115/69 | HR 68 | Temp 98.1°F | Resp 18 | Ht 64.0 in | Wt 151.6 lb

## 2023-07-26 DIAGNOSIS — C569 Malignant neoplasm of unspecified ovary: Secondary | ICD-10-CM | POA: Diagnosis not present

## 2023-07-26 DIAGNOSIS — Z90710 Acquired absence of both cervix and uterus: Secondary | ICD-10-CM | POA: Diagnosis not present

## 2023-07-26 DIAGNOSIS — Z923 Personal history of irradiation: Secondary | ICD-10-CM | POA: Diagnosis not present

## 2023-07-26 DIAGNOSIS — C786 Secondary malignant neoplasm of retroperitoneum and peritoneum: Secondary | ICD-10-CM | POA: Diagnosis not present

## 2023-07-26 DIAGNOSIS — Z79810 Long term (current) use of selective estrogen receptor modulators (SERMs): Secondary | ICD-10-CM | POA: Diagnosis not present

## 2023-07-26 DIAGNOSIS — Z9221 Personal history of antineoplastic chemotherapy: Secondary | ICD-10-CM | POA: Diagnosis not present

## 2023-07-26 DIAGNOSIS — R971 Elevated cancer antigen 125 [CA 125]: Secondary | ICD-10-CM | POA: Diagnosis not present

## 2023-07-26 DIAGNOSIS — G893 Neoplasm related pain (acute) (chronic): Secondary | ICD-10-CM | POA: Diagnosis not present

## 2023-07-26 DIAGNOSIS — Z90722 Acquired absence of ovaries, bilateral: Secondary | ICD-10-CM | POA: Diagnosis not present

## 2023-07-26 DIAGNOSIS — Z8543 Personal history of malignant neoplasm of ovary: Secondary | ICD-10-CM | POA: Diagnosis not present

## 2023-07-26 DIAGNOSIS — R180 Malignant ascites: Secondary | ICD-10-CM | POA: Diagnosis not present

## 2023-07-26 LAB — CMP (CANCER CENTER ONLY)
ALT: 22 U/L (ref 0–44)
AST: 23 U/L (ref 15–41)
Albumin: 4.3 g/dL (ref 3.5–5.0)
Alkaline Phosphatase: 34 U/L — ABNORMAL LOW (ref 38–126)
Anion gap: 8 (ref 5–15)
BUN: 26 mg/dL — ABNORMAL HIGH (ref 8–23)
CO2: 27 mmol/L (ref 22–32)
Calcium: 9.6 mg/dL (ref 8.9–10.3)
Chloride: 104 mmol/L (ref 98–111)
Creatinine: 0.71 mg/dL (ref 0.44–1.00)
GFR, Estimated: >60 mL/min (ref >60)
Glucose, Bld: 92 mg/dL (ref 70–99)
Potassium: 4 mmol/L (ref 3.5–5.1)
Sodium: 139 mmol/L (ref 135–145)
Total Bilirubin: 0.5 mg/dL (ref 0.3–1.2)
Total Protein: 7.2 g/dL (ref 6.5–8.1)

## 2023-07-26 LAB — CBC WITH DIFFERENTIAL (CANCER CENTER ONLY)
Abs Immature Granulocytes: 0.02 10*3/uL (ref 0.00–0.07)
Basophils Absolute: 0 10*3/uL (ref 0.0–0.1)
Basophils Relative: 0 %
Eosinophils Absolute: 0.1 10*3/uL (ref 0.0–0.5)
Eosinophils Relative: 2 %
HCT: 42.4 % (ref 36.0–46.0)
Hemoglobin: 13.7 g/dL (ref 12.0–15.0)
Immature Granulocytes: 0 %
Lymphocytes Relative: 16 %
Lymphs Abs: 1.1 10*3/uL (ref 0.7–4.0)
MCH: 31.6 pg (ref 26.0–34.0)
MCHC: 32.3 g/dL (ref 30.0–36.0)
MCV: 97.9 fL (ref 80.0–100.0)
Monocytes Absolute: 0.6 10*3/uL (ref 0.1–1.0)
Monocytes Relative: 9 %
Neutro Abs: 4.9 10*3/uL (ref 1.7–7.7)
Neutrophils Relative %: 73 %
Platelet Count: 258 10*3/uL (ref 150–400)
RBC: 4.33 MIL/uL (ref 3.87–5.11)
RDW: 14.3 % (ref 11.5–15.5)
WBC Count: 6.8 10*3/uL (ref 4.0–10.5)
nRBC: 0 % (ref 0.0–0.2)

## 2023-07-26 LAB — CA 125: Cancer Antigen (CA) 125: 228 U/mL — ABNORMAL HIGH (ref 0.0–38.1)

## 2023-07-26 LAB — MAGNESIUM: Magnesium: 2.3 mg/dL (ref 1.7–2.4)

## 2023-07-26 NOTE — Progress Notes (Addendum)
[facility] OFFICE PROGRESS NOTE   Diagnosis: Ovarian cancer  INTERVAL HISTORY:   [name] [name] returns as scheduled.  She continues tamoxifen.  She was admitted with leg edema and ascites on 06/24/2023.  She underwent a negative cardiology evaluation.  She had a paracentesis for 4.5 L on 06/29/2023.  She reports improvement in leg edema and abdominal distention following the paracentesis.  She was placed on torsemide and furosemide.  She reports frequent urination while on diuretics.  She saw Dr. Pricilla Holm on 07/14/2023.  Dr. Pricilla Holm recommends a restaging evaluation and consider switching to a different systemic therapy.  Objective:  Vital signs in last 24 hours:  Blood pressure 115/69, pulse 68, temperature 98.1 F (36.7 C), temperature source Temporal, resp. rate 18, height 5\' 4"  (1.626 m), weight 151 lb 9.6 oz (68.8 kg), SpO2 98%.   Lymphatics: No cervical, supraclavicular, axillary, or inguinal nodes Resp: Lungs clear bilaterally Cardio: Regular rate and rhythm GI: Mild tenderness in the right lower abdomen.  No apparent ascites, no mass Vascular: No leg edema    Portacath/PICC-without erythema  Lab Results:  Lab Results  Component Value Date   WBC 6.8 07/26/2023   HGB 13.7 07/26/2023   HCT 42.4 07/26/2023   MCV 97.9 07/26/2023   PLT 258 07/26/2023   NEUTROABS 4.9 07/26/2023    CMP  Lab Results  Component Value Date   NA 139 07/26/2023   K 4.0 07/26/2023   CL 104 07/26/2023   CO2 27 07/26/2023   GLUCOSE 92 07/26/2023   BUN 26 (H) 07/26/2023   CREATININE 0.71 07/26/2023   CALCIUM 9.6 07/26/2023   PROT 7.2 07/26/2023   ALBUMIN 4.3 07/26/2023   AST 23 07/26/2023   ALT 22 07/26/2023   ALKPHOS 34 (L) 07/26/2023   BILITOT 0.5 07/26/2023   GFRNONAA >60 07/26/2023   GFRAA >60 03/13/2020    Lab Results  Component Value Date   CEA 0.8 07/13/2012   CA125 8 08/18/2016     Medications: I have reviewed the patient's current  medications.   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 A 5 mm nodules remain on the right diaphragm. - TumorNext paired germline/tumor analyses: No somatic variants detected, germline CHEK2      VUS      Cycle 1 of adjuvant Taxol/carboplatin chemotherapy initiated on 09/19/2012.   The CA 125 normalized.   She completed day 15 of cycle 6 on 02/06/2013.   Restaging CT evaluation 03/29/2013 showed no evidence of metastatic disease in the chest. There was marked improvement in appearance/resolution of previous described peritoneal/omental disease. There was no convincing evidence of residual disease. There was minimal increased density in the region of the omentum favored to be treatment-related. There was no pelvic adenopathy. CA125 3.2 on 02/19/2014. 08/18/2016 CA-125 8 CT abdomen/pelvis 08/25/2016-solitary new enlarged right external iliac lymph node measuring 2.2 cm. Biopsy 09/07/2016-adenocarcinoma consistent with high-grade serous carcinoma.  ER +90%, PR -0%, HER2 negative (0); foundation 1-HRD not detected, microsatellite status cannot be determined, tumor mutation burden 8, FOLR1 positive-80%.   PET scan 09/22/2016-malignant range FDG uptake associated with the enlarged right external iliac lymph node compatible with metastatic adenopathy.  No additional sites of metastatic disease identified. Radiation 10/25/2016-12/01/2016 PET scan 03/28/2017-previously enlarged hypermetabolic right external iliac node-normal in size with resolution of hypermetabolic activity, no active malignancy identified PET scan 05/15/2018-no evidence of recurrent or metastatic disease, no  hypermetabolic lymph nodes CT abdomen/pelvis 09/19/2019-mild increase in the size of several left upper quadrant peritoneal nodules measuring up to 11 mm.  No other  sites of metastatic disease identified.  No ascites. CT abdomen/pelvis 03/14/2020-slight enlargement of several left upper quadrant peritoneal nodules, no ascites, no other evidence of disease progression CT abdomen/pelvis 09/12/2020 peritoneal nodularity/omental caking predominantly in the left upper/mid abdomen, mildly progressive.  Largest implant in the left upper abdomen adjacent to the stomach now measures 17 mm, previously 13 mm.  Overall volume of peritoneal disease has mildly progressed. CT abdomen/pelvis 01/13/2021- 4 mm right ureteral calculus with moderate right hydronephrosis and upper right hydroureter, progressive omental nodularity, stable calcified and partially calcified right pelvic nodules CT abdomen/pelvis 05/13/2021-enlargement of left upper quadrant omental nodules and a peritoneal nodule at the right iliac fossa, no ascites CT abdomen/pelvis 08/14/2021-mild increase in size of omental nodules, no ascites, no new site of metastatic disease Taxol/carboplatin 09/09/2021, 09/15/2021, 09/22/2021 Taxol/carboplatin 10/07/2021, 10/13/2021, 10/20/2021 Taxol/Carboplatin 11/03/2021, 11/10/2021, 11/17/2021 CT 11/23/2021-decrease in peritoneal metastases, no new or progressive disease, airspace opacity at both lung bases Taxol/carboplatin 12/01/2021, 12/08/2021, 12/15/2021 Taxol/Carboplatin every 2 weeks beginning 12/29/2021 Carboplatin alone 01/26/2022, Taxol held due to neuropathy Carboplatin alone 02/09/2022, Taxol held due to neuropathy Carboplatin alone 02/23/2022, Taxol held due to neuropathy Carboplatin alone 03/09/2022, Taxol held due to neuropathy CTs 03/25/2022-no significant change in omental, mesenteric disease.  No new or clearly progressive findings.  Previously noted patchy areas of consolidative opacity at the lung bases have resolved. Treatment break beginning 03/26/2022 CT/pelvis 08/10/2022-increased size of peritoneal nodules, increased small volume ascites, mild right lower lobe pleural  nodularity Cycle 1 salvage carboplatin 09/14/2022 (weekly x 3 followed by 1 week break) Cycle 2 salvage carboplatin 10/12/2022 (weekly x 3 followed by 1 week break) Cycle 3 salvage carboplatin 11/16/2022 (weekly x 3 followed by 1 week break) CT abdomen/pelvis 12/08/2022-mild increase in peritoneal carcinomatosis Tamoxifen 20 mg daily 01/07/2023 CT abdomen/pelvis 03/29/2023-increased ascites with persistent diffuse peritoneal nodularity, largest omental implants have decreased in size.  No enlarging implants.,  No evidence of bowel obstruction Paracentesis for 4.5 L 06/29/2023   2. Low abdomen/suprapubic pain prior to the exploratory laparotomy-likely secondary to omental/pelvic tumor; persistent mild pain in the lower abdomen   3. Chronic neck and back pain.   4. Anxiety -persistent despite Lexapro and Xanax. She has been evaluated by psychiatry. Currently on Lexapro. 5. Status post Port-A-Cath placement 09/22/2012. The Port-A-Cath was removed on 04/03/2013.   6. Neutropenia secondary to chemotherapy- day 15 cycle 1 and cycle 3. Taxol/carboplatin not given secondary to neutropenia.    7. Herpes zoster involving a right thoracic dermatome July 2015. She completed a course of Valtrex. 8. Nodular bony prominence at the left pelvis on rectal exam 06/05/2015-likely a benign finding 9.  Right ureter stone/hydroureteronephrosis on CT 01/13/2021-referred to urology; status post right ureteroscopy with stone extraction and ureteral stent placement.  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  Right hip fracture following a fall 11/02/2022-status post right hip anterior hemiarthroplasty 12.  Acute localized thrombus right proximal femoral vein 11/16/2022 Eliquis initiated 13.  Admission 06/24/2023 with volume overload      Disposition: [name] [name] has advanced stage ovarian cancer.  She recently underwent a large-volume paracentesis.  I suspect the progressive ascites is indicative of disease progression.   We will follow-up on the CA125 from today and she will be referred for a restaging CT evaluation.  We discussed potential salvage systemic treatment options including Mervituximab and Doxil.  We will follow-up on the HER2 testing ordered by Dr. Pricilla Holm.  [name] [name] will undergo a restaging CT evaluation prior to an office visit on 08/05/2023.  Thornton Papas, MD  07/26/2023  1:36 PM

## 2023-07-27 ENCOUNTER — Telehealth: Payer: Self-pay | Admitting: *Deleted

## 2023-07-27 NOTE — Telephone Encounter (Addendum)
Per Dr. Truett Perna: HER2 test was negative (ordered by Dr. Pricilla Holm), the CA125 is lower at 228. Continue tamoxifen, proceed with the restaging CT, follow-up as scheduled. Moved port access to 10/29 at 2:30 pm with CT at 5 pm and 3 pm arrival time to start oral contrast.  Left VM for patient to return call to discuss results/appointment. @ [phone] LVM for patient to call office.

## 2023-07-28 ENCOUNTER — Encounter: Payer: Self-pay | Admitting: Oncology

## 2023-07-28 NOTE — Telephone Encounter (Signed)
Able to connect w/[name] [name] and made her aware of lab results, CT appointment and to continue tamoxifen. She understands and agrees.

## 2023-08-02 ENCOUNTER — Other Ambulatory Visit: Payer: Medicare Other

## 2023-08-02 ENCOUNTER — Inpatient Hospital Stay: Payer: Medicare Other

## 2023-08-02 ENCOUNTER — Ambulatory Visit (HOSPITAL_BASED_OUTPATIENT_CLINIC_OR_DEPARTMENT_OTHER)
Admission: RE | Admit: 2023-08-02 | Discharge: 2023-08-02 | Disposition: A | Discharge status: Home / Self Care | Payer: Medicare Other | Source: Ambulatory Visit | Attending: Oncology | Admitting: Oncology

## 2023-08-02 DIAGNOSIS — K449 Diaphragmatic hernia without obstruction or gangrene: Secondary | ICD-10-CM | POA: Diagnosis not present

## 2023-08-02 DIAGNOSIS — C786 Secondary malignant neoplasm of retroperitoneum and peritoneum: Secondary | ICD-10-CM | POA: Insufficient documentation

## 2023-08-02 DIAGNOSIS — I700 Atherosclerosis of aorta: Secondary | ICD-10-CM | POA: Insufficient documentation

## 2023-08-02 DIAGNOSIS — Z95828 Presence of other vascular implants and grafts: Secondary | ICD-10-CM

## 2023-08-02 DIAGNOSIS — C569 Malignant neoplasm of unspecified ovary: Secondary | ICD-10-CM | POA: Diagnosis not present

## 2023-08-02 DIAGNOSIS — R188 Other ascites: Secondary | ICD-10-CM | POA: Insufficient documentation

## 2023-08-02 MED ORDER — HEPARIN SOD (PORK) LOCK FLUSH 100 UNIT/ML IV SOLN
500.0000 [IU] | Freq: Once | INTRAVENOUS | Status: DC | PRN
Start: 2023-08-02 — End: 2023-08-02

## 2023-08-02 MED ORDER — SODIUM CHLORIDE 0.9% FLUSH
10.0000 mL | INTRAVENOUS | Status: DC | PRN
  Administered 2023-08-02: 10 mL

## 2023-08-02 MED ORDER — IOHEXOL 300 MG/ML  SOLN
100.0000 mL | Freq: Once | INTRAMUSCULAR | Status: AC | PRN
  Administered 2023-08-02: 100 mL via INTRAVENOUS

## 2023-08-05 ENCOUNTER — Other Ambulatory Visit (HOSPITAL_BASED_OUTPATIENT_CLINIC_OR_DEPARTMENT_OTHER): Payer: Self-pay

## 2023-08-05 ENCOUNTER — Encounter: Payer: Self-pay | Admitting: Nurse Practitioner

## 2023-08-05 ENCOUNTER — Inpatient Hospital Stay: Payer: Medicare Other | Attending: Oncology | Admitting: Nurse Practitioner

## 2023-08-05 VITALS — BP 119/71 | HR 88 | Temp 97.9°F | Resp 18 | Ht 64.0 in | Wt 156.7 lb

## 2023-08-05 DIAGNOSIS — C569 Malignant neoplasm of unspecified ovary: Secondary | ICD-10-CM

## 2023-08-05 DIAGNOSIS — Z79810 Long term (current) use of selective estrogen receptor modulators (SERMs): Secondary | ICD-10-CM | POA: Insufficient documentation

## 2023-08-05 DIAGNOSIS — C786 Secondary malignant neoplasm of retroperitoneum and peritoneum: Secondary | ICD-10-CM | POA: Diagnosis not present

## 2023-08-05 DIAGNOSIS — Z8543 Personal history of malignant neoplasm of ovary: Secondary | ICD-10-CM | POA: Insufficient documentation

## 2023-08-05 MED ORDER — TAMOXIFEN CITRATE 20 MG PO TABS
20.0000 mg | ORAL_TABLET | Freq: Every day | ORAL | 1 refills | Status: DC
  Filled 2023-08-05: qty 90, 90d supply, fill #0

## 2023-08-05 NOTE — Progress Notes (Signed)
[facility] OFFICE PROGRESS NOTE   Diagnosis: Ovarian cancer  INTERVAL HISTORY:   Ms. Weisbecker returns as scheduled.  She continues tamoxifen.  She has stable mild intermittent pain at the right lower abdomen.  She thinks abdomen is slightly distended.  No nausea or vomiting.  Bowels are moving.  She has a good appetite.  Objective:  Vital signs in last 24 hours:  Blood pressure 119/71, pulse 88, temperature 97.9 F (36.6 C), temperature source Temporal, resp. rate 18, height 5\' 4"  (1.626 m), weight 156 lb 11.2 oz (71.1 kg), SpO2 97%.    HEENT: No thrush or ulcers. Lymphatics: No palpable cervical, supraclavicular, axillary or inguinal lymph nodes. Resp: Lungs clear bilaterally. Cardio: Regular rate and rhythm. GI: Abdomen is mildly distended.  No apparent ascites. Vascular: Very minimal lower leg edema bilaterally. Port-A-Cath without erythema.  Lab Results:  Lab Results  Component Value Date   WBC 6.8 07/26/2023   HGB 13.7 07/26/2023   HCT 42.4 07/26/2023   MCV 97.9 07/26/2023   PLT 258 07/26/2023   NEUTROABS 4.9 07/26/2023    Imaging:  No results found.  Medications: I have reviewed the patient's current medications.  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 A 5 mm nodules remain on the right diaphragm. - TumorNext paired germline/tumor analyses: No somatic variants detected, germline CHEK2      VUS      Cycle 1 of adjuvant Taxol/carboplatin chemotherapy initiated on 09/19/2012.   The CA 125 normalized.   She completed day 15 of cycle 6 on 02/06/2013.   Restaging CT evaluation 03/29/2013 showed no evidence of metastatic disease in the chest. There was marked improvement in appearance/resolution of previous described peritoneal/omental disease. There was no convincing evidence of  residual disease. There was minimal increased density in the region of the omentum favored to be treatment-related. There was no pelvic adenopathy. CA125 3.2 on 02/19/2014. 08/18/2016 CA-125 8 CT abdomen/pelvis 08/25/2016-solitary new enlarged right external iliac lymph node measuring 2.2 cm. Biopsy 09/07/2016-adenocarcinoma consistent with high-grade serous carcinoma.  ER +90%, PR -0%, HER2 negative (0); foundation 1-HRD not detected, microsatellite status cannot be determined, tumor mutation burden 8, FOLR1 positive-80%.   PET scan 09/22/2016-malignant range FDG uptake associated with the enlarged right external iliac lymph node compatible with metastatic adenopathy.  No additional sites of metastatic disease identified. Radiation 10/25/2016-12/01/2016 PET scan 03/28/2017-previously enlarged hypermetabolic right external iliac node-normal in size with resolution of hypermetabolic activity, no active malignancy identified PET scan 05/15/2018-no evidence of recurrent or metastatic disease, no hypermetabolic lymph nodes CT abdomen/pelvis 09/19/2019-mild increase in the size of several left upper quadrant peritoneal nodules measuring up to 11 mm.  No other sites of metastatic disease identified.  No ascites. CT abdomen/pelvis 03/14/2020-slight enlargement of several left upper quadrant peritoneal nodules, no ascites, no other evidence of disease progression CT abdomen/pelvis 09/12/2020 peritoneal nodularity/omental caking predominantly in the left upper/mid abdomen, mildly progressive.  Largest implant in the left upper abdomen adjacent to the stomach now measures 17 mm, previously 13 mm.  Overall volume of peritoneal disease has mildly progressed. CT abdomen/pelvis 01/13/2021- 4 mm right ureteral calculus with moderate right hydronephrosis and upper right hydroureter, progressive omental nodularity, stable calcified and partially calcified right pelvic nodules CT abdomen/pelvis 05/13/2021-enlargement of left  upper quadrant omental nodules and a peritoneal nodule at the right iliac fossa, no ascites CT abdomen/pelvis 08/14/2021-mild increase in size of  omental nodules, no ascites, no new site of metastatic disease Taxol/carboplatin 09/09/2021, 09/15/2021, 09/22/2021 Taxol/carboplatin 10/07/2021, 10/13/2021, 10/20/2021 Taxol/Carboplatin 11/03/2021, 11/10/2021, 11/17/2021 CT 11/23/2021-decrease in peritoneal metastases, no new or progressive disease, airspace opacity at both lung bases Taxol/carboplatin 12/01/2021, 12/08/2021, 12/15/2021 Taxol/Carboplatin every 2 weeks beginning 12/29/2021 Carboplatin alone 01/26/2022, Taxol held due to neuropathy Carboplatin alone 02/09/2022, Taxol held due to neuropathy Carboplatin alone 02/23/2022, Taxol held due to neuropathy Carboplatin alone 03/09/2022, Taxol held due to neuropathy CTs 03/25/2022-no significant change in omental, mesenteric disease.  No new or clearly progressive findings.  Previously noted patchy areas of consolidative opacity at the lung bases have resolved. Treatment break beginning 03/26/2022 CT/pelvis 08/10/2022-increased size of peritoneal nodules, increased small volume ascites, mild right lower lobe pleural nodularity Cycle 1 salvage carboplatin 09/14/2022 (weekly x 3 followed by 1 week break) Cycle 2 salvage carboplatin 10/12/2022 (weekly x 3 followed by 1 week break) Cycle 3 salvage carboplatin 11/16/2022 (weekly x 3 followed by 1 week break) CT abdomen/pelvis 12/08/2022-mild increase in peritoneal carcinomatosis Tamoxifen 20 mg daily 01/07/2023 CT abdomen/pelvis 03/29/2023-increased ascites with persistent diffuse peritoneal nodularity, largest omental implants have decreased in size.  No enlarging implants.,  No evidence of bowel obstruction Paracentesis for 4.5 L 06/29/2023 CTs 08/02/2023-peritoneal carcinomatosis similar to slightly improved.  Stable volume of ascites.  No evidence of metastatic disease in the chest. Tamoxifen 20 mg daily continued 08/02/2023    2. Low abdomen/suprapubic pain prior to the exploratory laparotomy-likely secondary to omental/pelvic tumor; persistent mild pain in the lower abdomen   3. Chronic neck and back pain.   4. Anxiety -persistent despite Lexapro and Xanax. She has been evaluated by psychiatry. Currently on Lexapro. 5. Status post Port-A-Cath placement 09/22/2012. The Port-A-Cath was removed on 04/03/2013.   6. Neutropenia secondary to chemotherapy- day 15 cycle 1 and cycle 3. Taxol/carboplatin not given secondary to neutropenia.    7. Herpes zoster involving a right thoracic dermatome July 2015. She completed a course of Valtrex. 8. Nodular bony prominence at the left pelvis on rectal exam 06/05/2015-likely a benign finding 9.  Right ureter stone/hydroureteronephrosis on CT 01/13/2021-referred to urology; status post right ureteroscopy with stone extraction and ureteral stent placement.  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  Right hip fracture following a fall 11/02/2022-status post right hip anterior hemiarthroplasty 12.  Acute localized thrombus right proximal femoral vein 11/16/2022 Eliquis initiated 13.  Admission 06/24/2023 with volume overload  Disposition: Ms. Foulk appears stable.  She continues tamoxifen.  Most recent CA125 was better.  CTs with stable to improved disease.  Results/images reviewed with her at today's visit.  Plan to continue tamoxifen.  She is in agreement.  She will return for a CA125 and follow-up visit in 6 weeks.  We are available to see her sooner if needed.  Patient seen with Dr. Truett Perna.  Lonna Cobb ANP/GNP-BC   08/05/2023  2:34 PM This was a shared visit with Lonna Cobb.  We reviewed the CT findings and images with Ms. Picinich.  The restaging CT reveals no evidence of disease progression.  The CA125 was lower last week.  She has not developed recurrent symptomatic ascites.  We discussed treatment options with Ms. Brick including mervetuximab/soravtansine and Doxil.   We reviewed potential toxicities associated with these chemotherapy agents.  She is reluctant to resume chemotherapy.  The plan is to continue tamoxifen.  I was present for greater than 50% of today's visit.  I performed Medical Decision Making.  Mancel Bale, MD

## 2023-08-17 ENCOUNTER — Ambulatory Visit: Payer: Medicare Other | Admitting: Cardiology

## 2023-08-17 ENCOUNTER — Other Ambulatory Visit: Payer: Medicare Other

## 2023-08-17 DIAGNOSIS — E785 Hyperlipidemia, unspecified: Secondary | ICD-10-CM | POA: Diagnosis not present

## 2023-08-17 LAB — LIPID PANEL
Cholesterol: 220 mg/dL — ABNORMAL HIGH (ref 0–200)
HDL: 49.5 mg/dL (ref >39.00)
LDL Cholesterol: 145 mg/dL — ABNORMAL HIGH (ref 0–99)
NonHDL: 170.15
Total CHOL/HDL Ratio: 4
Triglycerides: 127 mg/dL (ref 0.0–149.0)
VLDL: 25.4 mg/dL (ref 0.0–40.0)

## 2023-08-17 LAB — HEPATIC FUNCTION PANEL
ALT: 18 U/L (ref 0–35)
AST: 22 U/L (ref 0–37)
Albumin: 3.9 g/dL (ref 3.5–5.2)
Alkaline Phosphatase: 37 U/L — ABNORMAL LOW (ref 39–117)
Bilirubin, Direct: 0.1 mg/dL (ref 0.0–0.3)
Total Bilirubin: 0.6 mg/dL (ref 0.2–1.2)
Total Protein: 6.3 g/dL (ref 6.0–8.3)

## 2023-08-19 ENCOUNTER — Other Ambulatory Visit: Payer: Self-pay | Admitting: Nurse Practitioner

## 2023-08-19 ENCOUNTER — Other Ambulatory Visit: Payer: Medicare Other

## 2023-08-19 MED ORDER — ROSUVASTATIN CALCIUM 10 MG PO TABS: 10.0000 mg | ORAL_TABLET | Freq: Every day | ORAL | 3 refills | Status: DC

## 2023-08-22 ENCOUNTER — Telehealth: Payer: Self-pay

## 2023-08-22 ENCOUNTER — Other Ambulatory Visit: Payer: Self-pay

## 2023-08-22 DIAGNOSIS — C569 Malignant neoplasm of unspecified ovary: Secondary | ICD-10-CM

## 2023-08-22 NOTE — Telephone Encounter (Signed)
The patient contacted the office and expressed a concern regarding the need for fluid drainage. She is experiencing shortness of breath and edema in her ankles and abdomen. Abdomen is distended and tight.  Additionally, she reported a weight gain of approximately 5 pounds over the past couple of weeks, as she weighs herself every morning.

## 2023-08-22 NOTE — Telephone Encounter (Signed)
Patient is aware of her appointment..

## 2023-08-24 ENCOUNTER — Encounter: Payer: Self-pay | Admitting: Oncology

## 2023-08-24 NOTE — Telephone Encounter (Signed)
Telephone call  

## 2023-08-25 ENCOUNTER — Ambulatory Visit (HOSPITAL_COMMUNITY)
Admission: RE | Admit: 2023-08-25 | Discharge: 2023-08-25 | Disposition: A | Discharge status: Home / Self Care | Payer: Medicare Other | Source: Ambulatory Visit | Attending: Oncology | Admitting: Oncology

## 2023-08-25 DIAGNOSIS — C569 Malignant neoplasm of unspecified ovary: Secondary | ICD-10-CM | POA: Diagnosis not present

## 2023-08-25 DIAGNOSIS — R180 Malignant ascites: Secondary | ICD-10-CM | POA: Diagnosis not present

## 2023-08-25 DIAGNOSIS — R188 Other ascites: Secondary | ICD-10-CM | POA: Diagnosis not present

## 2023-08-25 MED ORDER — LIDOCAINE HCL 1 % IJ SOLN
INTRAMUSCULAR | Status: AC
  Filled 2023-08-25: qty 20

## 2023-08-25 NOTE — Progress Notes (Signed)
  IR PROCEDURE NOTE  Patient presented for requested diagnostic and therapeutic paracentesis. 15 cc of 1% lidocaine instilled 4 L of yellow fluid were removed from abdomen, as per requested limit. A portion of the fluid was sent to laboratory analysis. No complications immediately after procedure EBL <2 cc  Performed by Buzzy Han, PA-C.

## 2023-08-26 LAB — AMB RESULTS CONSOLE CBG: Glucose: 101

## 2023-08-26 NOTE — Progress Notes (Signed)
Pt declined SDOH questionnaire.

## 2023-08-30 LAB — CYTOLOGY - NON PAP

## 2023-08-31 ENCOUNTER — Telehealth: Payer: Self-pay | Admitting: Nurse Practitioner

## 2023-08-31 NOTE — Telephone Encounter (Signed)
I called Ms. Funchess to review the cytology report on ascites.  She understands cancer cells were identified.  We had previously discussed changing treatment to mervetuximab/soravtansine, Doxil.  She would like to discuss further at her next visit on 09/16/2023.

## 2023-09-08 ENCOUNTER — Other Ambulatory Visit: Payer: Self-pay | Admitting: Oncology

## 2023-09-16 ENCOUNTER — Encounter: Payer: Self-pay | Admitting: Nurse Practitioner

## 2023-09-16 ENCOUNTER — Inpatient Hospital Stay: Payer: Medicare Other | Attending: Oncology | Admitting: Nurse Practitioner

## 2023-09-16 ENCOUNTER — Inpatient Hospital Stay: Payer: Medicare Other

## 2023-09-16 VITALS — BP 122/85 | HR 94 | Temp 98.1°F | Resp 18 | Ht 64.0 in | Wt 154.4 lb

## 2023-09-16 DIAGNOSIS — R180 Malignant ascites: Secondary | ICD-10-CM | POA: Insufficient documentation

## 2023-09-16 DIAGNOSIS — G629 Polyneuropathy, unspecified: Secondary | ICD-10-CM | POA: Insufficient documentation

## 2023-09-16 DIAGNOSIS — D701 Agranulocytosis secondary to cancer chemotherapy: Secondary | ICD-10-CM | POA: Insufficient documentation

## 2023-09-16 DIAGNOSIS — C569 Malignant neoplasm of unspecified ovary: Secondary | ICD-10-CM

## 2023-09-16 DIAGNOSIS — Z95828 Presence of other vascular implants and grafts: Secondary | ICD-10-CM

## 2023-09-16 DIAGNOSIS — C786 Secondary malignant neoplasm of retroperitoneum and peritoneum: Secondary | ICD-10-CM | POA: Insufficient documentation

## 2023-09-16 MED ORDER — SODIUM CHLORIDE 0.9% FLUSH
10.0000 mL | INTRAVENOUS | Status: DC | PRN
  Administered 2023-09-16: 10 mL

## 2023-09-16 MED ORDER — HEPARIN SOD (PORK) LOCK FLUSH 100 UNIT/ML IV SOLN
500.0000 [IU] | Freq: Once | INTRAVENOUS | Status: AC | PRN
Start: 2023-09-16 — End: 2023-09-16
  Administered 2023-09-16: 500 [IU]

## 2023-09-16 NOTE — Progress Notes (Signed)
DISCONTINUE ON PATHWAY REGIMEN - Ovarian     A cycle is every 21 days:     Carboplatin   **Always confirm dose/schedule in your pharmacy ordering system**  PRIOR TREATMENT: OVOS113: Carboplatin AUC=6 q21 Days; Stop Treatment After 6 Cycles If Complete Response, Otherwise Continue Treatment Until Progression or Unacceptable Toxicity  START ON PATHWAY REGIMEN - Ovarian     A cycle is every 28 days:     Liposomal doxorubicin   **Always confirm dose/schedule in your pharmacy ordering system**  Patient Characteristics: Recurrent or Progressive Disease, Fourth Line and Beyond, Low, Medium, or Unknown Folate Receptor Alpha Expression OR  Prior Mirvetuximab OR Not a Candidate for Mirvetuximab, HER2 Expression Equivocal/Negative/Unknown (IHC ? 2+) BRCA Mutation Status: Absent Therapeutic Status: Recurrent or Progressive Disease Line of Therapy: Fourth Line and Beyond Folate Receptor Alpha (FR?) Expression: High Mirvetuximab Candidacy: Not a Candidate for Mirvetuximab OR Prior Mirvetuximab HER2 Expression Status by IHC: Negative (IHC 0, 1+) Intent of Therapy: Non-Curative / Palliative Intent, Discussed with Patient

## 2023-09-16 NOTE — Progress Notes (Signed)
[facility] OFFICE PROGRESS NOTE   Diagnosis: Ovarian cancer  INTERVAL HISTORY:   [name] [name] returns as scheduled.  She feels abdomen is mildly distended.  No nausea or vomiting.  She noted a change in bowel habits about 3 weeks ago.  She is experiencing urgency and loose stools.  She notes a "bump" on the right abdomen.  Leg swelling is better.  Objective:  Vital signs in last 24 hours:  Blood pressure 122/85, pulse 94, temperature 98.1 F (36.7 C), temperature source Temporal, resp. rate 18, height 5\' 4"  (1.626 m), weight 154 lb 6.4 oz (70 kg), SpO2 96%.    Resp: Lungs clear bilaterally. Cardio: Regular rate and rhythm. GI: Abdomen is distended. Vascular: Trace lower leg edema bilaterally. Skin: Small nodular cutaneous/subcutaneous lesion right mid lateral abdomen. Port-A-Cath without erythema.  Lab Results:  Lab Results  Component Value Date   WBC 6.8 07/26/2023   HGB 13.7 07/26/2023   HCT 42.4 07/26/2023   MCV 97.9 07/26/2023   PLT 258 07/26/2023   NEUTROABS 4.9 07/26/2023    Imaging:  No results found.  Medications: I have reviewed the patient's current medications.  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 A 5 mm nodules remain on the right diaphragm. - TumorNext paired germline/tumor analyses: No somatic variants detected, germline CHEK2      VUS      Cycle 1 of adjuvant Taxol/carboplatin chemotherapy initiated on 09/19/2012.   The CA 125 normalized.   She completed day 15 of cycle 6 on 02/06/2013.   Restaging CT evaluation 03/29/2013 showed no evidence of metastatic disease in the chest. There was marked improvement in appearance/resolution of previous described peritoneal/omental disease. There was no convincing evidence of residual disease. There was minimal increased  density in the region of the omentum favored to be treatment-related. There was no pelvic adenopathy. CA125 3.2 on 02/19/2014. 08/18/2016 CA-125 8 CT abdomen/pelvis 08/25/2016-solitary new enlarged right external iliac lymph node measuring 2.2 cm. Biopsy 09/07/2016-adenocarcinoma consistent with high-grade serous carcinoma.  ER +90%, PR -0%, HER2 negative (0); foundation 1-HRD not detected, microsatellite status cannot be determined, tumor mutation burden 8, FOLR1 positive-80%.   PET scan 09/22/2016-malignant range FDG uptake associated with the enlarged right external iliac lymph node compatible with metastatic adenopathy.  No additional sites of metastatic disease identified. Radiation 10/25/2016-12/01/2016 PET scan 03/28/2017-previously enlarged hypermetabolic right external iliac node-normal in size with resolution of hypermetabolic activity, no active malignancy identified PET scan 05/15/2018-no evidence of recurrent or metastatic disease, no hypermetabolic lymph nodes CT abdomen/pelvis 09/19/2019-mild increase in the size of several left upper quadrant peritoneal nodules measuring up to 11 mm.  No other sites of metastatic disease identified.  No ascites. CT abdomen/pelvis 03/14/2020-slight enlargement of several left upper quadrant peritoneal nodules, no ascites, no other evidence of disease progression CT abdomen/pelvis 09/12/2020 peritoneal nodularity/omental caking predominantly in the left upper/mid abdomen, mildly progressive.  Largest implant in the left upper abdomen adjacent to the stomach now measures 17 mm, previously 13 mm.  Overall volume of peritoneal disease has mildly progressed. CT abdomen/pelvis 01/13/2021- 4 mm right ureteral calculus with moderate right hydronephrosis and upper right hydroureter, progressive omental nodularity, stable calcified and partially calcified right pelvic nodules CT abdomen/pelvis 05/13/2021-enlargement of left upper quadrant omental nodules and a peritoneal  nodule at the right iliac fossa, no ascites CT abdomen/pelvis 08/14/2021-mild increase in size of omental nodules, no ascites,  no new site of metastatic disease Taxol/carboplatin 09/09/2021, 09/15/2021, 09/22/2021 Taxol/carboplatin 10/07/2021, 10/13/2021, 10/20/2021 Taxol/Carboplatin 11/03/2021, 11/10/2021, 11/17/2021 CT 11/23/2021-decrease in peritoneal metastases, no new or progressive disease, airspace opacity at both lung bases Taxol/carboplatin 12/01/2021, 12/08/2021, 12/15/2021 Taxol/Carboplatin every 2 weeks beginning 12/29/2021 Carboplatin alone 01/26/2022, Taxol held due to neuropathy Carboplatin alone 02/09/2022, Taxol held due to neuropathy Carboplatin alone 02/23/2022, Taxol held due to neuropathy Carboplatin alone 03/09/2022, Taxol held due to neuropathy CTs 03/25/2022-no significant change in omental, mesenteric disease.  No new or clearly progressive findings.  Previously noted patchy areas of consolidative opacity at the lung bases have resolved. Treatment break beginning 03/26/2022 CT/pelvis 08/10/2022-increased size of peritoneal nodules, increased small volume ascites, mild right lower lobe pleural nodularity Cycle 1 salvage carboplatin 09/14/2022 (weekly x 3 followed by 1 week break) Cycle 2 salvage carboplatin 10/12/2022 (weekly x 3 followed by 1 week break) Cycle 3 salvage carboplatin 11/16/2022 (weekly x 3 followed by 1 week break) CT abdomen/pelvis 12/08/2022-mild increase in peritoneal carcinomatosis Tamoxifen 20 mg daily 01/07/2023 CT abdomen/pelvis 03/29/2023-increased ascites with persistent diffuse peritoneal nodularity, largest omental implants have decreased in size.  No enlarging implants.,  No evidence of bowel obstruction Paracentesis for 4.5 L 06/29/2023 CTs 08/02/2023-peritoneal carcinomatosis similar to slightly improved.  Stable volume of ascites.  No evidence of metastatic disease in the chest. Tamoxifen 20 mg daily continued 08/02/2023 Paracentesis 08/25/2023-4 L of fluid removed,  cytology shows adenocarcinoma Plan for Doxil   2. Low abdomen/suprapubic pain prior to the exploratory laparotomy-likely secondary to omental/pelvic tumor; persistent mild pain in the lower abdomen   3. Chronic neck and back pain.   4. Anxiety -persistent despite Lexapro and Xanax. She has been evaluated by psychiatry. Currently on Lexapro. 5. Status post Port-A-Cath placement 09/22/2012. The Port-A-Cath was removed on 04/03/2013.   6. Neutropenia secondary to chemotherapy- day 15 cycle 1 and cycle 3. Taxol/carboplatin not given secondary to neutropenia.    7. Herpes zoster involving a right thoracic dermatome July 2015. She completed a course of Valtrex. 8. Nodular bony prominence at the left pelvis on rectal exam 06/05/2015-likely a benign finding 9.  Right ureter stone/hydroureteronephrosis on CT 01/13/2021-referred to urology; status post right ureteroscopy with stone extraction and ureteral stent placement.  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  Right hip fracture following a fall 11/02/2022-status post right hip anterior hemiarthroplasty 12.  Acute localized thrombus right proximal femoral vein 11/16/2022 Eliquis initiated 13.  Admission 06/24/2023 with volume overload    Disposition: [name] [name] appears stable.  We reviewed the positive cytology from the paracentesis last month.  She understands the recommendation to discontinue tamoxifen/begin a different systemic therapy.  We discussed mirvetuximab soravtansine and Doxil.  We reviewed potential toxicities associated with mirvetuximab soravtansine including ocular toxicity, hematologic toxicity, hypersensitivity reaction, peripheral neuropathy, pneumonitis, diarrhea or constipation, nausea.  We discussed potential toxicities associated with Doxil including bone marrow toxicity, cardiomyopathy, infusion related reaction, rash, mouth sores.  She was provided with printed information.  She is most comfortable proceeding with Doxil.  We  are referring her for a baseline 2D echo.  She will return for follow-up and cycle 1 Doxil on 10/06/2023.  We are available to see her sooner if needed.  Patient seen with Dr. Truett Perna.   Lonna Cobb ANP/GNP-BC   09/16/2023  2:25 PM This was a shared visit with Lonna Cobb.  [name] [name] was interviewed and examined.  She has clinical evidence of progressive ovarian cancer.  We discussed treatment options including mirvetuximab and  Doxil.  We reviewed potential toxicities associated with each of these agents.  She is concerned about the potential for eye symptoms and neuropathy with mirvetuximab.  The plan is to begin Doxil after the holidays.  We reviewed potential toxicities associated with Doxil including the chance for a rash, hand/foot syndrome, hematologic toxicity, alopecia, mucositis, and cardiac toxicity.  She agrees to proceed.  I was present for greater than 50% of today's visit.  Are performed Medical Decision Making.  A treatment plan was entered today.  Mancel Bale, MD

## 2023-09-17 ENCOUNTER — Other Ambulatory Visit: Payer: Self-pay

## 2023-09-17 LAB — CA 125: Cancer Antigen (CA) 125: 222 U/mL — ABNORMAL HIGH (ref 0.0–38.1)

## 2023-09-20 ENCOUNTER — Telehealth: Payer: Self-pay

## 2023-09-20 NOTE — Telephone Encounter (Signed)
The patient is scheduled for her echocardiogram on September 23, 2023, at [place].

## 2023-09-23 ENCOUNTER — Ambulatory Visit (INDEPENDENT_AMBULATORY_CARE_PROVIDER_SITE_OTHER): Payer: Medicare Other

## 2023-09-23 ENCOUNTER — Other Ambulatory Visit: Payer: Self-pay

## 2023-09-23 ENCOUNTER — Telehealth: Payer: Self-pay

## 2023-09-23 DIAGNOSIS — C569 Malignant neoplasm of unspecified ovary: Secondary | ICD-10-CM

## 2023-09-23 DIAGNOSIS — R069 Unspecified abnormalities of breathing: Secondary | ICD-10-CM | POA: Diagnosis not present

## 2023-09-23 DIAGNOSIS — Z5111 Encounter for antineoplastic chemotherapy: Secondary | ICD-10-CM

## 2023-09-23 LAB — ECHOCARDIOGRAM COMPLETE
AR max vel: 2.89 cm2
AV Area VTI: 3.09 cm2
AV Area mean vel: 3.04 cm2
AV Mean grad: 2 mm[Hg]
AV Peak grad: 4.2 mm[Hg]
Ao pk vel: 1.02 m/s
Area-P 1/2: 3.37 cm2
S' Lateral: 2.61 cm

## 2023-09-23 NOTE — Telephone Encounter (Signed)
The patient called to request a paracentesis at [facility]. She reports experiencing swelling in her foot and an increase in abdominal size. She indicates a weight gain of approximately 10 pounds and is experiencing shortness of breath.

## 2023-09-26 ENCOUNTER — Other Ambulatory Visit: Payer: Self-pay | Admitting: *Deleted

## 2023-09-26 ENCOUNTER — Telehealth: Payer: Self-pay

## 2023-09-26 MED ORDER — APIXABAN 5 MG PO TABS: 5.0000 mg | ORAL_TABLET | Freq: Two times a day (BID) | ORAL | 1 refills | Status: DC

## 2023-09-26 NOTE — Telephone Encounter (Signed)
[employer] has contacted [place] to inform that they will not be covering Eliquis. They will send a fax with the denial letter outlining the reasons for the decision, including the information necessary for an appeal.

## 2023-09-26 NOTE — Progress Notes (Signed)
Received letter from [facility] that Eliquis is not covered. Sent new script for generic form to her pharmacy.

## 2023-09-27 ENCOUNTER — Ambulatory Visit (HOSPITAL_COMMUNITY)
Admission: RE | Admit: 2023-09-27 | Discharge: 2023-09-27 | Disposition: A | Discharge status: Home / Self Care | Payer: Medicare Other | Source: Ambulatory Visit | Attending: Oncology | Admitting: Oncology

## 2023-09-27 DIAGNOSIS — C801 Malignant (primary) neoplasm, unspecified: Secondary | ICD-10-CM | POA: Diagnosis not present

## 2023-09-27 DIAGNOSIS — R180 Malignant ascites: Secondary | ICD-10-CM | POA: Diagnosis not present

## 2023-09-27 DIAGNOSIS — C569 Malignant neoplasm of unspecified ovary: Secondary | ICD-10-CM | POA: Diagnosis not present

## 2023-09-27 HISTORY — PX: IR PARACENTESIS: IMG2679

## 2023-09-27 MED ORDER — LIDOCAINE HCL 1 % IJ SOLN: 20.0000 mL | Freq: Once | INTRAMUSCULAR | Status: DC

## 2023-09-27 MED ORDER — LIDOCAINE HCL 1 % IJ SOLN
INTRAMUSCULAR | Status: AC
  Filled 2023-09-27: qty 20

## 2023-09-27 NOTE — Procedures (Signed)
PROCEDURE SUMMARY:  Successful image-guided paracentesis from the right abdomen.  Yielded 4 liters of clear, straw-colored peritoneal fluid.  No immediate complications.  EBL: zero Patient tolerated well.   Specimen was not sent for labs.  Please see imaging section of Epic for full dictation.  Sable Feil PA-C 09/27/2023 3:53 PM

## 2023-10-06 ENCOUNTER — Encounter: Payer: Self-pay | Admitting: Nurse Practitioner

## 2023-10-06 ENCOUNTER — Inpatient Hospital Stay: Payer: Medicare Other

## 2023-10-06 ENCOUNTER — Inpatient Hospital Stay: Payer: Medicare Other | Admitting: Nurse Practitioner

## 2023-10-06 ENCOUNTER — Inpatient Hospital Stay: Payer: Medicare Other | Attending: Oncology

## 2023-10-06 VITALS — BP 127/80 | HR 81 | Temp 98.1°F | Resp 18 | Ht 64.0 in | Wt 157.4 lb

## 2023-10-06 VITALS — BP 131/75 | HR 73 | Temp 98.0°F | Resp 18

## 2023-10-06 DIAGNOSIS — C569 Malignant neoplasm of unspecified ovary: Secondary | ICD-10-CM | POA: Insufficient documentation

## 2023-10-06 DIAGNOSIS — Z95828 Presence of other vascular implants and grafts: Secondary | ICD-10-CM

## 2023-10-06 DIAGNOSIS — R180 Malignant ascites: Secondary | ICD-10-CM | POA: Diagnosis not present

## 2023-10-06 DIAGNOSIS — C786 Secondary malignant neoplasm of retroperitoneum and peritoneum: Secondary | ICD-10-CM | POA: Insufficient documentation

## 2023-10-06 DIAGNOSIS — G620 Drug-induced polyneuropathy: Secondary | ICD-10-CM | POA: Diagnosis not present

## 2023-10-06 DIAGNOSIS — D701 Agranulocytosis secondary to cancer chemotherapy: Secondary | ICD-10-CM | POA: Insufficient documentation

## 2023-10-06 DIAGNOSIS — Z5111 Encounter for antineoplastic chemotherapy: Secondary | ICD-10-CM | POA: Diagnosis not present

## 2023-10-06 DIAGNOSIS — Z86718 Personal history of other venous thrombosis and embolism: Secondary | ICD-10-CM | POA: Diagnosis not present

## 2023-10-06 DIAGNOSIS — K5900 Constipation, unspecified: Secondary | ICD-10-CM | POA: Insufficient documentation

## 2023-10-06 LAB — CBC WITH DIFFERENTIAL (CANCER CENTER ONLY)
Abs Immature Granulocytes: 0.02 10*3/uL (ref 0.00–0.07)
Basophils Absolute: 0 10*3/uL (ref 0.0–0.1)
Basophils Relative: 1 %
Eosinophils Absolute: 0.1 10*3/uL (ref 0.0–0.5)
Eosinophils Relative: 2 %
HCT: 39.6 % (ref 36.0–46.0)
Hemoglobin: 12.8 g/dL (ref 12.0–15.0)
Immature Granulocytes: 0 %
Lymphocytes Relative: 18 %
Lymphs Abs: 1.1 10*3/uL (ref 0.7–4.0)
MCH: 31.8 pg (ref 26.0–34.0)
MCHC: 32.3 g/dL (ref 30.0–36.0)
MCV: 98.5 fL (ref 80.0–100.0)
Monocytes Absolute: 0.6 10*3/uL (ref 0.1–1.0)
Monocytes Relative: 10 %
Neutro Abs: 4.1 10*3/uL (ref 1.7–7.7)
Neutrophils Relative %: 69 %
Platelet Count: 260 10*3/uL (ref 150–400)
RBC: 4.02 MIL/uL (ref 3.87–5.11)
RDW: 13.9 % (ref 11.5–15.5)
WBC Count: 6 10*3/uL (ref 4.0–10.5)
nRBC: 0 % (ref 0.0–0.2)

## 2023-10-06 LAB — CMP (CANCER CENTER ONLY)
ALT: 20 U/L (ref 0–44)
AST: 18 U/L (ref 15–41)
Albumin: 3.4 g/dL — ABNORMAL LOW (ref 3.5–5.0)
Alkaline Phosphatase: 33 U/L — ABNORMAL LOW (ref 38–126)
Anion gap: 7 (ref 5–15)
BUN: 15 mg/dL (ref 8–23)
CO2: 26 mmol/L (ref 22–32)
Calcium: 8.3 mg/dL — ABNORMAL LOW (ref 8.9–10.3)
Chloride: 107 mmol/L (ref 98–111)
Creatinine: 0.58 mg/dL (ref 0.44–1.00)
GFR, Estimated: >60 mL/min (ref >60)
Glucose, Bld: 89 mg/dL (ref 70–99)
Potassium: 4.1 mmol/L (ref 3.5–5.1)
Sodium: 140 mmol/L (ref 135–145)
Total Bilirubin: 0.3 mg/dL (ref 0.0–1.2)
Total Protein: 5.8 g/dL — ABNORMAL LOW (ref 6.5–8.1)

## 2023-10-06 MED ORDER — PROCHLORPERAZINE MALEATE 10 MG PO TABS: 10.0000 mg | ORAL_TABLET | Freq: Four times a day (QID) | ORAL | 2 refills | Status: DC | PRN

## 2023-10-06 MED ORDER — ALTEPLASE 2 MG IJ SOLR: 2.0000 mg | Freq: Once | INTRAMUSCULAR | Status: DC | PRN

## 2023-10-06 MED ORDER — HEPARIN SOD (PORK) LOCK FLUSH 100 UNIT/ML IV SOLN
500.0000 [IU] | Freq: Once | INTRAVENOUS | Status: AC | PRN
  Administered 2023-10-06: 500 [IU]

## 2023-10-06 MED ORDER — DOXORUBICIN HCL LIPOSOMAL CHEMO INJECTION 2 MG/ML
39.0000 mg/m2 | Freq: Once | INTRAVENOUS | Status: AC
  Administered 2023-10-06: 70 mg via INTRAVENOUS
  Filled 2023-10-06: qty 25

## 2023-10-06 MED ORDER — SODIUM CHLORIDE 0.9% FLUSH
10.0000 mL | INTRAVENOUS | Status: DC | PRN
  Administered 2023-10-06: 10 mL

## 2023-10-06 MED ORDER — DEXTROSE 5 % IV SOLN: INTRAVENOUS | Status: DC

## 2023-10-06 MED ORDER — DEXAMETHASONE SODIUM PHOSPHATE 10 MG/ML IJ SOLN
10.0000 mg | Freq: Once | INTRAMUSCULAR | Status: AC
Start: 2023-10-06 — End: 2023-10-06
  Administered 2023-10-06: 10 mg via INTRAVENOUS
  Filled 2023-10-06: qty 1

## 2023-10-06 MED ORDER — ONDANSETRON HCL 8 MG PO TABS: 8.0000 mg | ORAL_TABLET | Freq: Three times a day (TID) | ORAL | 3 refills | Status: DC | PRN

## 2023-10-06 NOTE — Progress Notes (Signed)
 Patient seen by Olam Ned NP today  Vitals are within treatment parameters:Yes   Labs are within treatment parameters: Yes   Treatment plan has been signed: Yes   Per physician team, Patient is ready for treatment and there are NO modifications to the treatment plan.

## 2023-10-06 NOTE — Patient Instructions (Signed)
 [facility] - A DEPT OF [facility]  Discharge Instructions: Thank you for choosing [facility] to provide your oncology and hematology care.   If you have a lab appointment with the Cancer Center, please go directly to the Cancer Center and check in at the registration area.   Wear comfortable clothing and clothing appropriate for easy access to any Portacath or PICC line.   We strive to give you quality time with your provider. You may need to reschedule your appointment if you arrive late (15 or more minutes).  Arriving late affects you and other patients whose appointments are after yours.  Also, if you miss three or more appointments without notifying the office, you may be dismissed from the clinic at the provider's discretion.      For prescription refill requests, have your pharmacy contact our office and allow 72 hours for refills to be completed.    Today you received the following chemotherapy and/or immunotherapy agent: Doxorubicin  HCL Liposomal (Doxil ).      To help prevent nausea and vomiting after your treatment, we encourage you to take your nausea medication as directed.  BELOW ARE SYMPTOMS THAT SHOULD BE REPORTED IMMEDIATELY: *FEVER GREATER THAN 100.4 F (38 C) OR HIGHER *CHILLS OR SWEATING *NAUSEA AND VOMITING THAT IS NOT CONTROLLED WITH YOUR NAUSEA MEDICATION *UNUSUAL SHORTNESS OF BREATH *UNUSUAL BRUISING OR BLEEDING *URINARY PROBLEMS (pain or burning when urinating, or frequent urination) *BOWEL PROBLEMS (unusual diarrhea, constipation, pain near the anus) TENDERNESS IN MOUTH AND THROAT WITH OR WITHOUT PRESENCE OF ULCERS (sore throat, sores in mouth, or a toothache) UNUSUAL RASH, SWELLING OR PAIN  UNUSUAL VAGINAL DISCHARGE OR ITCHING   Items with * indicate a potential emergency and should be followed up as soon as possible or go to the Emergency Department if any problems should occur.  Please show the CHEMOTHERAPY  ALERT CARD or IMMUNOTHERAPY ALERT CARD at check-in to the Emergency Department and triage nurse.  Should you have questions after your visit or need to cancel or reschedule your appointment, please contact [facility] - A DEPT OF [facility]  Dept: [phone]  and follow the prompts.  Office hours are 8:00 a.m. to 4:30 p.m. Monday - Friday. Please note that voicemails left after 4:00 p.m. may not be returned until the following business day.  We are closed weekends and major holidays. You have access to a nurse at all times for urgent questions. Please call the main number to the clinic Dept: [phone] and follow the prompts.   For any non-urgent questions, you may also contact your provider using MyChart. We now offer e-Visits for anyone 55 and older to request care online for non-urgent symptoms. For details visit [email or web address].   Also download the MyChart app! Go to the app store, search MyChart, open the app, select [place], and log in with your MyChart username and password.   Doxorubicin  Liposomal Injection What is this medication? DOXORUBICIN  LIPOSOMAL (dox oh ROO bi sin LIP oh som al) treats some types of cancer. It works by slowing down the growth of cancer cells. This medicine may be used for other purposes; ask your health care provider or pharmacist if you have questions. COMMON BRAND NAME(S): Doxil , Lipodox What should I tell my care team before I take this medication? They need to know if you have any of these conditions: Blood disorder Heart disease Infection especially a viral infection, such as chickenpox,  cold sores, herpes Liver disease Recent or ongoing radiation An unusual or allergic reaction to doxorubicin , soybeans, other medications, foods, dyes, or preservatives If you or your partner are pregnant or trying to get pregnant Breast-feeding How should I use this medication? This medication is injected into a vein.  It is given by your care team in a hospital or clinic setting. Talk to your care team about the use of this medication in children. Special care may be needed. Overdosage: If you think you have taken too much of this medicine contact a poison control center or emergency room at once. NOTE: This medicine is only for you. Do not share this medicine with others. What if I miss a dose? Keep appointments for follow-up doses. It is important not to miss your dose. Call your care team if you are unable to keep an appointment. What may interact with this medication? Do not take this medication with any of the following: Zidovudine This medication may also interact with the following: Medications to increase blood counts, such as filgrastim, pegfilgrastim, sargramostim Vaccines This list may not describe all possible interactions. Give your health care provider a list of all the medicines, herbs, non-prescription drugs, or dietary supplements you use. Also tell them if you smoke, drink alcohol, or use illegal drugs. Some items may interact with your medicine. What should I watch for while using this medication? Your condition will be monitored carefully while you are receiving this medication. You may need blood work while taking this medication. This medication may make you feel generally unwell. This is not uncommon as chemotherapy can affect healthy cells as well as cancer cells. Report any side effects. Continue your course of treatment even though you feel ill unless your care team tells you to stop. Your urine may turn orange-red for a few days after your dose. This is not blood. If your urine is dark or brown, call your care team. In some cases, you may be given additional medications to help with side effects. Follow all directions for their use. Talk to your care team about your risk of cancer. You may be more at risk for certain types of cancers if you take this medication. Talk to your care team  if you or your partner may be pregnant. Serious birth defects can occur if you take this medication during pregnancy and for 6 months after the last dose. Contraception is recommended while taking this medication and for 6 months after the last dose. Your care team can help you find the option that works for you. If your partner can get pregnant, use a condom while taking this medication and for 6 months after the last dose. Do not breastfeed while taking this medication. This medication may cause infertility. Talk to your care team if you are concerned about your fertility. What side effects may I notice from receiving this medication? Side effects that you should report to your care team as soon as possible: Allergic reactions--skin rash, itching, hives, swelling of the face, lips, tongue, or throat Heart failure--shortness of breath, swelling of the ankles, feet, or hands, sudden weight gain, unusual weakness or fatigue Infection--fever, chills, cough, sore throat, wounds that don't heal, pain or trouble when passing urine, general feeling of discomfort or being unwell Infusion reactions--chest pain, shortness of breath or trouble breathing, feeling faint or lightheaded Low red blood cell level--unusual weakness or fatigue, dizziness, headache, trouble breathing Redness, swelling, and blistering of the skin over hands and feet Unusual bruising  or bleeding Side effects that usually do not require medical attention (report to your care team if they continue or are bothersome): Constipation Diarrhea Loss of appetite Nausea Pain, redness, or swelling with sores inside the mouth or throat Red urine Unusual weakness or fatigue This list may not describe all possible side effects. Call your doctor for medical advice about side effects. You may report side effects to FDA at 1-800-FDA-1088. Where should I keep my medication? This medication is given in a hospital or clinic. It will not be stored at  home. NOTE: This sheet is a summary. It may not cover all possible information. If you have questions about this medicine, talk to your doctor, pharmacist, or health care provider.  2024 Elsevier/Gold Standard (2022-12-23 00:00:00)

## 2023-10-06 NOTE — Progress Notes (Addendum)
 [facility] OFFICE PROGRESS NOTE   Diagnosis: Ovarian cancer  INTERVAL HISTORY:   [name] [name] returns as scheduled.  She underwent another paracentesis 09/27/2023 with 4 L of fluid removed.  She does not feel the fluid has reaccumulated.  Bowels are moving.  No diarrhea.  No nausea.  She has a good appetite.  Objective:  Vital signs in last 24 hours:  Blood pressure 127/80, pulse 81, temperature 98.1 F (36.7 C), temperature source Temporal, resp. rate 18, height 5' 4 (1.626 m), weight 157 lb 6.4 oz (71.4 kg), SpO2 96%.    HEENT: No thrush or ulcers. Resp: Lungs clear bilaterally. Cardio: Regular rate and rhythm. GI: Abdomen is mildly distended.  No hepatomegaly. Vascular: No leg edema. Skin: The small nodular cutaneous/subcutaneous lesion at the right mid lateral abdomen is smaller. Port-A-Cath without erythema.   Lab Results:  Lab Results  Component Value Date   WBC 6.8 07/26/2023   HGB 13.7 07/26/2023   HCT 42.4 07/26/2023   MCV 97.9 07/26/2023   PLT 258 07/26/2023   NEUTROABS 4.9 07/26/2023    Imaging:  No results found.  Medications: I have reviewed the patient's current medications.  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 A 5 mm nodules remain on the right diaphragm. - TumorNext paired germline/tumor analyses: No somatic variants detected, germline CHEK2      VUS      Cycle 1 of adjuvant Taxol /carboplatin  chemotherapy initiated on 09/19/2012.   The CA 125 normalized.   She completed day 15 of cycle 6 on 02/06/2013.   Restaging CT evaluation 03/29/2013 showed no evidence of metastatic disease in the chest. There was marked improvement in appearance/resolution of previous described peritoneal/omental disease. There was no convincing evidence of residual disease. There was  minimal increased density in the region of the omentum favored to be treatment-related. There was no pelvic adenopathy. CA125 3.2 on 02/19/2014. 08/18/2016 CA-125 8 CT abdomen/pelvis 08/25/2016-solitary new enlarged right external iliac lymph node measuring 2.2 cm. Biopsy 09/07/2016-adenocarcinoma consistent with high-grade serous carcinoma.  ER +90%, PR -0%, HER2 negative (0); foundation 1-HRD not detected, microsatellite status cannot be determined, tumor mutation burden 8, FOLR1 positive-80%.   PET scan 09/22/2016-malignant range FDG uptake associated with the enlarged right external iliac lymph node compatible with metastatic adenopathy.  No additional sites of metastatic disease identified. Radiation 10/25/2016-12/01/2016 PET scan 03/28/2017-previously enlarged hypermetabolic right external iliac node-normal in size with resolution of hypermetabolic activity, no active malignancy identified PET scan 05/15/2018-no evidence of recurrent or metastatic disease, no hypermetabolic lymph nodes CT abdomen/pelvis 09/19/2019-mild increase in the size of several left upper quadrant peritoneal nodules measuring up to 11 mm.  No other sites of metastatic disease identified.  No ascites. CT abdomen/pelvis 03/14/2020-slight enlargement of several left upper quadrant peritoneal nodules, no ascites, no other evidence of disease progression CT abdomen/pelvis 09/12/2020 peritoneal nodularity/omental caking predominantly in the left upper/mid abdomen, mildly progressive.  Largest implant in the left upper abdomen adjacent to the stomach now measures 17 mm, previously 13 mm.  Overall volume of peritoneal disease has mildly progressed. CT abdomen/pelvis 01/13/2021- 4 mm right ureteral calculus with moderate right hydronephrosis and upper right hydroureter, progressive omental nodularity, stable calcified and partially calcified right pelvic nodules CT abdomen/pelvis 05/13/2021-enlargement of left upper quadrant omental nodules  and a peritoneal nodule at the right iliac fossa, no ascites CT abdomen/pelvis 08/14/2021-mild increase in size of  omental nodules, no ascites, no new site of metastatic disease Taxol /carboplatin  09/09/2021, 09/15/2021, 09/22/2021 Taxol /carboplatin  10/07/2021, 10/13/2021, 10/20/2021 Taxol /Carboplatin  11/03/2021, 11/10/2021, 11/17/2021 CT 11/23/2021-decrease in peritoneal metastases, no new or progressive disease, airspace opacity at both lung bases Taxol /carboplatin  12/01/2021, 12/08/2021, 12/15/2021 Taxol /Carboplatin  every 2 weeks beginning 12/29/2021 Carboplatin  alone 01/26/2022, Taxol  held due to neuropathy Carboplatin  alone 02/09/2022, Taxol  held due to neuropathy Carboplatin  alone 02/23/2022, Taxol  held due to neuropathy Carboplatin  alone 03/09/2022, Taxol  held due to neuropathy CTs 03/25/2022-no significant change in omental, mesenteric disease.  No new or clearly progressive findings.  Previously noted patchy areas of consolidative opacity at the lung bases have resolved. Treatment break beginning 03/26/2022 CT/pelvis 08/10/2022-increased size of peritoneal nodules, increased small volume ascites, mild right lower lobe pleural nodularity Cycle 1 salvage carboplatin  09/14/2022 (weekly x 3 followed by 1 week break) Cycle 2 salvage carboplatin  10/12/2022 (weekly x 3 followed by 1 week break) Cycle 3 salvage carboplatin  11/16/2022 (weekly x 3 followed by 1 week break) CT abdomen/pelvis 12/08/2022-mild increase in peritoneal carcinomatosis Tamoxifen  20 mg daily 01/07/2023 CT abdomen/pelvis 03/29/2023-increased ascites with persistent diffuse peritoneal nodularity, largest omental implants have decreased in size.  No enlarging implants.,  No evidence of bowel obstruction Paracentesis for 4.5 L 06/29/2023 CTs 08/02/2023-peritoneal carcinomatosis similar to slightly improved.  Stable volume of ascites.  No evidence of metastatic disease in the chest. Tamoxifen  20 mg daily continued 08/02/2023 Paracentesis 08/25/2023-4 L of  fluid removed, cytology shows adenocarcinoma Plan for Doxil  Baseline 2D echo 09/23/2023-LVEF 55 to 60%.  Left ventricle has normal function.  Abnormal strain with apical sparing, follow-up TTE/MRI suggested in 3 months. Cycle 1 Doxil  10/06/2023   2. Low abdomen/suprapubic pain prior to the exploratory laparotomy-likely secondary to omental/pelvic tumor; persistent mild pain in the lower abdomen   3. Chronic neck and back pain.   4. Anxiety -persistent despite Lexapro  and Xanax . She has been evaluated by psychiatry. Currently on Lexapro . 5. Status post Port-A-Cath placement 09/22/2012. The Port-A-Cath was removed on 04/03/2013.   6. Neutropenia secondary to chemotherapy- day 15 cycle 1 and cycle 3. Taxol /carboplatin  not given secondary to neutropenia.    7. Herpes zoster involving a right thoracic dermatome July 2015. She completed a course of Valtrex . 8. Nodular bony prominence at the left pelvis on rectal exam 06/05/2015-likely a benign finding 9.  Right ureter stone/hydroureteronephrosis on CT 01/13/2021-referred to urology; status post right ureteroscopy with stone extraction and ureteral stent placement.  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  Right hip fracture following a fall 11/02/2022-status post right hip anterior hemiarthroplasty 12.  Acute localized thrombus right proximal femoral vein 11/16/2022 Eliquis  initiated 13.  Admission 06/24/2023 with volume overload    Disposition: Ms. Vallandingham appears stable.  She is scheduled to begin treatment today with Doxil .  We again reviewed potential toxicities.  She agrees to proceed.  Plan for cycle 1 Doxil  today as scheduled.  CBC and chemistry panel reviewed.  Labs adequate to proceed as above.  She will return for follow-up and treatment in 4 weeks.  We are available to see her sooner if needed.    Olam Ned ANP/GNP-BC   10/06/2023  8:22 AM

## 2023-10-07 ENCOUNTER — Encounter: Payer: Self-pay | Admitting: *Deleted

## 2023-10-07 LAB — CA 125: Cancer Antigen (CA) 125: 110 U/mL — ABNORMAL HIGH (ref 0.0–38.1)

## 2023-10-07 NOTE — Progress Notes (Signed)
 Pt attended 08/26/23 screening event where her b/p was 130/72 and her blood sugar was 101. At the event, the pt noted having insurance, not smoking but living with someone who does, and did not identify any SDOH insecurities. Chart review indicates pt's PCP of record is [name] Crandall, NP at [place] at [facility] office, whom pt last saw on 07/08/23 and with whom pt has future appt on 10/10/23. Chart review also indicates pt is receiving ongoing oncology care with Dr. Cloretta as her oncology PCP, and is being monitored and supported by the oncology team throughout her current ongoing chemotherapy treatments. No additional health equity team support indicated at this time.

## 2023-10-10 ENCOUNTER — Ambulatory Visit: Payer: Medicare Other | Admitting: Nurse Practitioner

## 2023-10-11 ENCOUNTER — Ambulatory Visit (INDEPENDENT_AMBULATORY_CARE_PROVIDER_SITE_OTHER): Payer: Medicare Other | Admitting: Nurse Practitioner

## 2023-10-11 VITALS — BP 112/78 | HR 71 | Temp 98.2°F | Ht 64.0 in | Wt 153.6 lb

## 2023-10-11 DIAGNOSIS — R8290 Unspecified abnormal findings in urine: Secondary | ICD-10-CM | POA: Diagnosis not present

## 2023-10-11 DIAGNOSIS — R600 Localized edema: Secondary | ICD-10-CM

## 2023-10-11 DIAGNOSIS — R180 Malignant ascites: Secondary | ICD-10-CM | POA: Diagnosis not present

## 2023-10-11 DIAGNOSIS — H5789 Other specified disorders of eye and adnexa: Secondary | ICD-10-CM | POA: Diagnosis not present

## 2023-10-11 LAB — POCT URINALYSIS DIPSTICK
Bilirubin, UA: NEGATIVE
Blood, UA: NEGATIVE
Glucose, UA: NEGATIVE
Ketones, UA: NEGATIVE
Leukocytes, UA: NEGATIVE
Nitrite, UA: NEGATIVE
Protein, UA: POSITIVE — AB
Spec Grav, UA: 1.02 (ref 1.010–1.025)
Urobilinogen, UA: 0.2 U/dL
pH, UA: 5.5 (ref 5.0–8.0)

## 2023-10-11 MED ORDER — ERYTHROMYCIN 5 MG/GM OP OINT: 1.0000 | TOPICAL_OINTMENT | Freq: Three times a day (TID) | OPHTHALMIC | 0 refills | Status: AC

## 2023-10-11 NOTE — Assessment & Plan Note (Signed)
 UA in office today.  UA was fairly benign given patient's age comorbidities and on immunocompromising drugs will send off urine culture

## 2023-10-11 NOTE — Patient Instructions (Signed)
 Nice to see you today I want to see you in 3 month and we will recheck cholesterol at that visit Follow up sooner if you need me

## 2023-10-11 NOTE — Assessment & Plan Note (Signed)
 Given eye discharge and length of symptoms and on immunocompromising drug treat with erythromycin 0.5% ointment 3 times daily for a week.  Patient will wear glasses versus contacts for the next week

## 2023-10-11 NOTE — Progress Notes (Signed)
 Established Patient Office Visit  Subjective   Patient ID: [name] [name], female    DOB: 12/07/1950  Age: 73 y.o. MRN: [id number]  Chief Complaint  Patient presents with   Follow-up    Pt complains of starting first round of chemotherapy last week. Pt states that she got fluid out last month.     HPI  Ascites: states that she has had another paracentesis.  Patient had a total of 3 paracentesis' thus far  Edema: states that her swelling in her legs have since went down. States that she is having some numbess in her hands and her feet.  She is wondering this is neuropathy.  This also could be secondary to chemotherapy  Ovarian cancer: started a new a chemotherapy.  She will do 3 treatments 1 treatment each month for 3 months.  Patient's completed 1 thus far.  She is followed by oncology  Eye discharge: state that it did it a week ago and then again yesterday. States that it is pus. States that she does wear contacts.   Urine order: states that it has been a few weeks. No new lower abdominal pain     Review of Systems  Constitutional:  Negative for chills and fever.  Gastrointestinal:  Negative for abdominal pain, nausea and vomiting.  Genitourinary:  Positive for frequency (noctuira). Negative for dysuria, hematuria and urgency.  Musculoskeletal:  Negative for back pain.      Objective:     BP 112/78   Pulse 71   Temp 98.2 F (36.8 C) (Oral)   Ht 5' 4 (1.626 m)   Wt 153 lb 9.6 oz (69.7 kg)   SpO2 96%   BMI 26.37 kg/m    Physical Exam Vitals and nursing note reviewed.  Constitutional:      Appearance: Normal appearance.  Eyes:     General:        Right eye: No discharge.        Left eye: Discharge present.    Extraocular Movements: Extraocular movements intact.     Pupils: Pupils are equal, round, and reactive to light.  Cardiovascular:     Rate and Rhythm: Normal rate and regular rhythm.     Heart sounds: Normal heart sounds.  Pulmonary:     Effort:  Pulmonary effort is normal.     Breath sounds: Normal breath sounds.  Abdominal:     General: Bowel sounds are normal. There is no distension.     Palpations: There is no mass.     Tenderness: There is no abdominal tenderness. There is no right CVA tenderness or left CVA tenderness.     Hernia: No hernia is present.  Musculoskeletal:     Right lower leg: Edema present.     Left lower leg: Edema (L>R) present.  Neurological:     Mental Status: She is alert.      Results for orders placed or performed in visit on 10/11/23  POCT urinalysis dipstick  Result Value Ref Range   Color, UA dark  amber    Clarity, UA cloudy    Glucose, UA Negative Negative   Bilirubin, UA neg    Ketones, UA neg    Spec Grav, UA 1.020 1.010 - 1.025   Blood, UA neg    pH, UA 5.5 5.0 - 8.0   Protein, UA Positive (A) Negative   Urobilinogen, UA 0.2 0.2 or 1.0 E.U./dL   Nitrite, UA neg    Leukocytes, UA Negative Negative  Appearance     Odor        The 10-year ASCVD risk score (Arnett DK, et al., 2019) is: 10.4%    Assessment & Plan:   Problem List Items Addressed This Visit       Other   Bad odor of urine - Primary   UA in office today.  UA was fairly benign given patient's age comorbidities and on immunocompromising drugs will send off urine culture      Relevant Orders   POCT urinalysis dipstick (Completed)   Urine Culture   Lower extremity edema   Has improved with paracentesis.  Continue taking diuretic as prescribed elevating legs and wearing compression socks      Malignant ascites   Patient is currently followed by oncology and has underwent 3 paracentesis'.  Continue following with oncology continue taking diuretic as prescribed      Eye discharge   Given eye discharge and length of symptoms and on immunocompromising drug treat with erythromycin  0.5% ointment 3 times daily for a week.  Patient will wear glasses versus contacts for the next week      Relevant Medications    erythromycin  ophthalmic ointment    Return in about 3 months (around 01/09/2024) for edema/lipid recheck .    Adina Crandall, NP

## 2023-10-11 NOTE — Assessment & Plan Note (Signed)
 Has improved with paracentesis.  Continue taking diuretic as prescribed elevating legs and wearing compression socks

## 2023-10-11 NOTE — Assessment & Plan Note (Signed)
 Patient is currently followed by oncology and has underwent 3 paracentesis'.  Continue following with oncology continue taking diuretic as prescribed

## 2023-10-12 LAB — URINE CULTURE
MICRO NUMBER:: 15927361
Result:: NO GROWTH
SPECIMEN QUALITY:: ADEQUATE

## 2023-10-29 ENCOUNTER — Other Ambulatory Visit: Payer: Self-pay | Admitting: Oncology

## 2023-10-29 DIAGNOSIS — C569 Malignant neoplasm of unspecified ovary: Secondary | ICD-10-CM

## 2023-10-31 ENCOUNTER — Telehealth: Payer: Self-pay | Admitting: *Deleted

## 2023-10-31 DIAGNOSIS — C569 Malignant neoplasm of unspecified ovary: Secondary | ICD-10-CM

## 2023-10-31 NOTE — Telephone Encounter (Signed)
Called to report having constipation and has had to take Magnesium citrate x 2 with relief. Suggested she start BID Miralax and #2 colace bid on a routine basis for bowels. She is also asking for a paracentesis--having abdominal pressure and swelling again. OK for paracentesis per NP.

## 2023-11-01 NOTE — Telephone Encounter (Signed)
Notified of paracentesis at [facility] 1/29 at 1:30/2:00 pm.

## 2023-11-02 ENCOUNTER — Ambulatory Visit (HOSPITAL_COMMUNITY)
Admission: RE | Admit: 2023-11-02 | Discharge: 2023-11-02 | Disposition: A | Discharge status: Home / Self Care | Payer: PRIVATE HEALTH INSURANCE | Source: Ambulatory Visit | Attending: Oncology | Admitting: Oncology

## 2023-11-02 DIAGNOSIS — C569 Malignant neoplasm of unspecified ovary: Secondary | ICD-10-CM | POA: Diagnosis not present

## 2023-11-02 DIAGNOSIS — R180 Malignant ascites: Secondary | ICD-10-CM | POA: Diagnosis not present

## 2023-11-02 DIAGNOSIS — R188 Other ascites: Secondary | ICD-10-CM | POA: Diagnosis not present

## 2023-11-02 MED ORDER — LIDOCAINE HCL 1 % IJ SOLN
INTRAMUSCULAR | Status: AC
  Filled 2023-11-02: qty 20

## 2023-11-02 NOTE — Procedures (Signed)
PROCEDURE SUMMARY:  Successful US guided paracentesis from right lateral abdomen.  Yielded 4 liters of hazy, yellow fluid.  No immediate complications.  Patient tolerated well.  EBL = trace  Specimen not sent for labs.  Eldora Napp Charmian Muff PA-C 11/02/2023 3:25 PM

## 2023-11-03 ENCOUNTER — Inpatient Hospital Stay: Payer: Medicare Other

## 2023-11-03 ENCOUNTER — Inpatient Hospital Stay (HOSPITAL_BASED_OUTPATIENT_CLINIC_OR_DEPARTMENT_OTHER): Payer: Medicare Other | Admitting: Oncology

## 2023-11-03 VITALS — BP 108/77 | HR 91 | Temp 98.1°F | Resp 18 | Ht 64.0 in | Wt 154.3 lb

## 2023-11-03 VITALS — BP 107/88 | HR 85 | Temp 98.1°F | Resp 20

## 2023-11-03 DIAGNOSIS — C786 Secondary malignant neoplasm of retroperitoneum and peritoneum: Secondary | ICD-10-CM | POA: Diagnosis not present

## 2023-11-03 DIAGNOSIS — C569 Malignant neoplasm of unspecified ovary: Secondary | ICD-10-CM | POA: Diagnosis not present

## 2023-11-03 DIAGNOSIS — Z86718 Personal history of other venous thrombosis and embolism: Secondary | ICD-10-CM | POA: Diagnosis not present

## 2023-11-03 DIAGNOSIS — R180 Malignant ascites: Secondary | ICD-10-CM | POA: Diagnosis not present

## 2023-11-03 DIAGNOSIS — K5900 Constipation, unspecified: Secondary | ICD-10-CM | POA: Diagnosis not present

## 2023-11-03 DIAGNOSIS — G620 Drug-induced polyneuropathy: Secondary | ICD-10-CM | POA: Diagnosis not present

## 2023-11-03 DIAGNOSIS — Z5111 Encounter for antineoplastic chemotherapy: Secondary | ICD-10-CM | POA: Diagnosis not present

## 2023-11-03 DIAGNOSIS — D701 Agranulocytosis secondary to cancer chemotherapy: Secondary | ICD-10-CM | POA: Diagnosis not present

## 2023-11-03 LAB — CBC WITH DIFFERENTIAL (CANCER CENTER ONLY)
Abs Immature Granulocytes: 0.03 10*3/uL (ref 0.00–0.07)
Basophils Absolute: 0 10*3/uL (ref 0.0–0.1)
Basophils Relative: 1 %
Eosinophils Absolute: 0.1 10*3/uL (ref 0.0–0.5)
Eosinophils Relative: 1 %
HCT: 36.6 % (ref 36.0–46.0)
Hemoglobin: 11.9 g/dL — ABNORMAL LOW (ref 12.0–15.0)
Immature Granulocytes: 0 %
Lymphocytes Relative: 11 %
Lymphs Abs: 0.8 10*3/uL (ref 0.7–4.0)
MCH: 31.6 pg (ref 26.0–34.0)
MCHC: 32.5 g/dL (ref 30.0–36.0)
MCV: 97.3 fL (ref 80.0–100.0)
Monocytes Absolute: 1 10*3/uL (ref 0.1–1.0)
Monocytes Relative: 13 %
Neutro Abs: 5.3 10*3/uL (ref 1.7–7.7)
Neutrophils Relative %: 74 %
Platelet Count: 358 10*3/uL (ref 150–400)
RBC: 3.76 MIL/uL — ABNORMAL LOW (ref 3.87–5.11)
RDW: 13.3 % (ref 11.5–15.5)
WBC Count: 7.2 10*3/uL (ref 4.0–10.5)
nRBC: 0 % (ref 0.0–0.2)

## 2023-11-03 LAB — CMP (CANCER CENTER ONLY)
ALT: 16 U/L (ref 0–44)
AST: 18 U/L (ref 15–41)
Albumin: 3 g/dL — ABNORMAL LOW (ref 3.5–5.0)
Alkaline Phosphatase: 36 U/L — ABNORMAL LOW (ref 38–126)
Anion gap: 7 (ref 5–15)
BUN: 24 mg/dL — ABNORMAL HIGH (ref 8–23)
CO2: 30 mmol/L (ref 22–32)
Calcium: 8.3 mg/dL — ABNORMAL LOW (ref 8.9–10.3)
Chloride: 100 mmol/L (ref 98–111)
Creatinine: 0.76 mg/dL (ref 0.44–1.00)
GFR, Estimated: >60 mL/min (ref >60)
Glucose, Bld: 102 mg/dL — ABNORMAL HIGH (ref 70–99)
Potassium: 3.8 mmol/L (ref 3.5–5.1)
Sodium: 137 mmol/L (ref 135–145)
Total Bilirubin: 0.3 mg/dL (ref 0.0–1.2)
Total Protein: 5.7 g/dL — ABNORMAL LOW (ref 6.5–8.1)

## 2023-11-03 MED ORDER — HEPARIN SOD (PORK) LOCK FLUSH 100 UNIT/ML IV SOLN
500.0000 [IU] | Freq: Once | INTRAVENOUS | Status: AC | PRN
  Administered 2023-11-03: 500 [IU]

## 2023-11-03 MED ORDER — DOXORUBICIN HCL LIPOSOMAL CHEMO INJECTION 2 MG/ML
39.0000 mg/m2 | Freq: Once | INTRAVENOUS | Status: AC
  Administered 2023-11-03: 70 mg via INTRAVENOUS
  Filled 2023-11-03: qty 25

## 2023-11-03 MED ORDER — DEXAMETHASONE SODIUM PHOSPHATE 10 MG/ML IJ SOLN
10.0000 mg | Freq: Once | INTRAMUSCULAR | Status: AC
  Administered 2023-11-03: 10 mg via INTRAVENOUS
  Filled 2023-11-03: qty 1

## 2023-11-03 MED ORDER — DOXORUBICIN HCL LIPOSOMAL CHEMO INJECTION 2 MG/ML
40.0000 mg/m2 | Freq: Once | INTRAVENOUS | Status: DC
  Filled 2023-11-03: qty 36

## 2023-11-03 MED ORDER — SODIUM CHLORIDE 0.9% FLUSH
10.0000 mL | INTRAVENOUS | Status: DC | PRN
  Administered 2023-11-03: 10 mL

## 2023-11-03 MED ORDER — DEXTROSE 5 % IV SOLN: INTRAVENOUS | Status: DC

## 2023-11-03 NOTE — Progress Notes (Signed)
Patient seen by Dr. Thornton Papas today  Vitals are within treatment parameters:Yes   Labs are within treatment parameters: Yes   Treatment plan has been signed: Yes   Per physician team, Patient is ready for treatment and there are NO modifications to the treatment plan.

## 2023-11-03 NOTE — Progress Notes (Signed)
[facility] OFFICE PROGRESS NOTE   Diagnosis: Ovarian cancer  INTERVAL HISTORY:   Ms. Schabel completed cycle 1 Doxil 10/06/2023.  No acute nausea/vomiting, rash, or mouth sores.  She was recently treated for a "infection "at the medial left eye.  This has improved following erythromycin ointment.  She reports nausea and abdominal discomfort for the past few weeks.  She is constipated.  She had a bowel movement after MiraLAX, a stool softener, and magnesium citrate.  She underwent a paracentesis for 4 L on 11/01/2022.  She feels better after the paracentesis.  She continues anticoagulation therapy.  No bleeding.  She has cramping at the right lower leg at night.  Objective:  Vital signs in last 24 hours:  Blood pressure 108/77, pulse 91, temperature 98.1 F (36.7 C), resp. rate 18, height 5\' 4"  (1.626 m), weight 154 lb 4.8 oz (70 kg), SpO2 96%.    HEENT: No thrush or ulcers Resp: Lungs clear bilaterally Cardio: Regular rate and rhythm GI: Distended with ascites, mild tenderness-chiefly in the right lower abdomen, no mass Vascular: Trace pitting edema at the ankle bilaterally     Portacath/PICC-without erythema  Lab Results:  Lab Results  Component Value Date   WBC 7.2 11/03/2023   HGB 11.9 (L) 11/03/2023   HCT 36.6 11/03/2023   MCV 97.3 11/03/2023   PLT 358 11/03/2023   NEUTROABS 5.3 11/03/2023    CMP  Lab Results  Component Value Date   NA 137 11/03/2023   K 3.8 11/03/2023   CL 100 11/03/2023   CO2 30 11/03/2023   GLUCOSE 102 (H) 11/03/2023   BUN 24 (H) 11/03/2023   CREATININE 0.76 11/03/2023   CALCIUM 8.3 (L) 11/03/2023   PROT 5.7 (L) 11/03/2023   ALBUMIN 3.0 (L) 11/03/2023   AST 18 11/03/2023   ALT 16 11/03/2023   ALKPHOS 36 (L) 11/03/2023   BILITOT 0.3 11/03/2023   GFRNONAA >60 11/03/2023   GFRAA >60 03/13/2020    Lab Results  Component Value Date   CEA 0.8 07/13/2012   CA125 8 08/18/2016      Imaging:  US Paracentesis Result  Date: 11/02/2023 INDICATION: 73 year old female with ovarian cancer with recurrent ascites. Request for therapeutic paracentesis. Max 4L. EXAM: ULTRASOUND GUIDED right PARACENTESIS MEDICATIONS: 1% Lidocaine, 12mL. COMPLICATIONS: None immediate. PROCEDURE: Informed written consent was obtained from the patient after a discussion of the risks, benefits and alternatives to treatment. A timeout was performed prior to the initiation of the procedure. Initial ultrasound scanning demonstrates a large amount of ascites within the right lower abdominal quadrant. The right lower abdomen was prepped and draped in the usual sterile fashion. 1% lidocaine was used for local anesthesia. Following this, a 19 gauge, 7-cm, Yueh catheter was introduced. An ultrasound image was saved for documentation purposes. The paracentesis was performed. The catheter was removed and a dressing was applied. The patient tolerated the procedure well without immediate post procedural complication. FINDINGS: A total of approximately 4L of hazy, yellow fluid was removed. IMPRESSION: Successful ultrasound-guided paracentesis yielding 4 liters of peritoneal fluid. Procedure Performed by: Sherlene Shams, PA-C Electronically Signed   By: Gilmer Mor D.O.   On: 11/02/2023 16:07    Medications: I have reviewed the patient's current medications.   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 A 5 mm nodules remain on the right diaphragm. - TumorNext paired germline/tumor analyses: No somatic variants  detected, germline CHEK2      VUS      Cycle 1 of adjuvant Taxol/carboplatin chemotherapy initiated on 09/19/2012.   The CA 125 normalized.   She completed day 15 of cycle 6 on 02/06/2013.   Restaging CT evaluation 03/29/2013 showed no evidence of metastatic disease in the chest. There  was marked improvement in appearance/resolution of previous described peritoneal/omental disease. There was no convincing evidence of residual disease. There was minimal increased density in the region of the omentum favored to be treatment-related. There was no pelvic adenopathy. CA125 3.2 on 02/19/2014. 08/18/2016 CA-125 8 CT abdomen/pelvis 08/25/2016-solitary new enlarged right external iliac lymph node measuring 2.2 cm. Biopsy 09/07/2016-adenocarcinoma consistent with high-grade serous carcinoma.  ER +90%, PR -0%, HER2 negative (0); foundation 1-HRD not detected, microsatellite status cannot be determined, tumor mutation burden 8, FOLR1 positive-80%.   PET scan 09/22/2016-malignant range FDG uptake associated with the enlarged right external iliac lymph node compatible with metastatic adenopathy.  No additional sites of metastatic disease identified. Radiation 10/25/2016-12/01/2016 PET scan 03/28/2017-previously enlarged hypermetabolic right external iliac node-normal in size with resolution of hypermetabolic activity, no active malignancy identified PET scan 05/15/2018-no evidence of recurrent or metastatic disease, no hypermetabolic lymph nodes CT abdomen/pelvis 09/19/2019-mild increase in the size of several left upper quadrant peritoneal nodules measuring up to 11 mm.  No other sites of metastatic disease identified.  No ascites. CT abdomen/pelvis 03/14/2020-slight enlargement of several left upper quadrant peritoneal nodules, no ascites, no other evidence of disease progression CT abdomen/pelvis 09/12/2020 peritoneal nodularity/omental caking predominantly in the left upper/mid abdomen, mildly progressive.  Largest implant in the left upper abdomen adjacent to the stomach now measures 17 mm, previously 13 mm.  Overall volume of peritoneal disease has mildly progressed. CT abdomen/pelvis 01/13/2021- 4 mm right ureteral calculus with moderate right hydronephrosis and upper right hydroureter, progressive  omental nodularity, stable calcified and partially calcified right pelvic nodules CT abdomen/pelvis 05/13/2021-enlargement of left upper quadrant omental nodules and a peritoneal nodule at the right iliac fossa, no ascites CT abdomen/pelvis 08/14/2021-mild increase in size of omental nodules, no ascites, no new site of metastatic disease Taxol/carboplatin 09/09/2021, 09/15/2021, 09/22/2021 Taxol/carboplatin 10/07/2021, 10/13/2021, 10/20/2021 Taxol/Carboplatin 11/03/2021, 11/10/2021, 11/17/2021 CT 11/23/2021-decrease in peritoneal metastases, no new or progressive disease, airspace opacity at both lung bases Taxol/carboplatin 12/01/2021, 12/08/2021, 12/15/2021 Taxol/Carboplatin every 2 weeks beginning 12/29/2021 Carboplatin alone 01/26/2022, Taxol held due to neuropathy Carboplatin alone 02/09/2022, Taxol held due to neuropathy Carboplatin alone 02/23/2022, Taxol held due to neuropathy Carboplatin alone 03/09/2022, Taxol held due to neuropathy CTs 03/25/2022-no significant change in omental, mesenteric disease.  No new or clearly progressive findings.  Previously noted patchy areas of consolidative opacity at the lung bases have resolved. Treatment break beginning 03/26/2022 CT/pelvis 08/10/2022-increased size of peritoneal nodules, increased small volume ascites, mild right lower lobe pleural nodularity Cycle 1 salvage carboplatin 09/14/2022 (weekly x 3 followed by 1 week break) Cycle 2 salvage carboplatin 10/12/2022 (weekly x 3 followed by 1 week break) Cycle 3 salvage carboplatin 11/16/2022 (weekly x 3 followed by 1 week break) CT abdomen/pelvis 12/08/2022-mild increase in peritoneal carcinomatosis Tamoxifen 20 mg daily 01/07/2023 CT abdomen/pelvis 03/29/2023-increased ascites with persistent diffuse peritoneal nodularity, largest omental implants have decreased in size.  No enlarging implants.,  No evidence of bowel obstruction Paracentesis for 4.5 L 06/29/2023 CTs 08/02/2023-peritoneal carcinomatosis similar to slightly  improved.  Stable volume of ascites.  No evidence of metastatic disease in the chest. Tamoxifen 20 mg daily continued 08/02/2023 Paracentesis 08/25/2023-4 L of fluid removed,  cytology shows adenocarcinoma Plan for Doxil Baseline 2D echo 09/23/2023-LVEF 55 to 60%.  Left ventricle has normal function.  Abnormal strain with apical sparing, follow-up TTE/MRI suggested in 3 months. Cycle 1 Doxil 10/06/2023 Cycle 2 Doxil 11/03/2023   2. Low abdomen/suprapubic pain prior to the exploratory laparotomy-likely secondary to omental/pelvic tumor; persistent mild pain in the lower abdomen   3. Chronic neck and back pain.   4. Anxiety -persistent despite Lexapro and Xanax. She has been evaluated by psychiatry. Currently on Lexapro. 5. Status post Port-A-Cath placement 09/22/2012. The Port-A-Cath was removed on 04/03/2013.   6. Neutropenia secondary to chemotherapy- day 15 cycle 1 and cycle 3. Taxol/carboplatin not given secondary to neutropenia.    7. Herpes zoster involving a right thoracic dermatome July 2015. She completed a course of Valtrex. 8. Nodular bony prominence at the left pelvis on rectal exam 06/05/2015-likely a benign finding 9.  Right ureter stone/hydroureteronephrosis on CT 01/13/2021-referred to urology; status post right ureteroscopy with stone extraction and ureteral stent placement.  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  Right hip fracture following a fall 11/02/2022-status post right hip anterior hemiarthroplasty 12.  Acute localized thrombus right proximal femoral vein 11/16/2022 Eliquis initiated 13.  Admission 06/24/2023 with volume overload     Disposition: Ms. Weakland has been static ovarian cancer.  She completed 1 cycle of salvage therapy with Doxil on 10/06/2023.  She tolerated the treatment well.  She will complete cycle 2 today.  She has increased symptoms related to carcinomatosis including nausea, belching, and constipation.  She continues to need palliative paracentesis  procedures.  I recommended she continue a bowel regimen and follow a low residual diet.  She will call for increased symptoms.  She will return for an office visit and cycle 3 Doxil in 4 weeks.  We will plan for a restaging CT evaluation after cycle 3.  Thornton Papas, MD  11/03/2023  8:54 AM

## 2023-11-03 NOTE — Patient Instructions (Addendum)
[facility] - A DEPT OF [facility]   Discharge Instructions: Thank you for choosing [facility] to provide your oncology and hematology care.   If you have a lab appointment with the Cancer Center, please go directly to the Cancer Center and check in at the registration area.   Wear comfortable clothing and clothing appropriate for easy access to any Portacath or PICC line.   We strive to give you quality time with your provider. You may need to reschedule your appointment if you arrive late (15 or more minutes).  Arriving late affects you and other patients whose appointments are after yours.  Also, if you miss three or more appointments without notifying the office, you may be dismissed from the clinic at the provider's discretion.      For prescription refill requests, have your pharmacy contact our office and allow 72 hours for refills to be completed.    Today you received the following chemotherapy and/or immunotherapy agent: Doxorubicin HCL Liposomal (DOXIL).      To help prevent nausea and vomiting after your treatment, we encourage you to take your nausea medication as directed.  BELOW ARE SYMPTOMS THAT SHOULD BE REPORTED IMMEDIATELY: *FEVER GREATER THAN 100.4 F (38 C) OR HIGHER *CHILLS OR SWEATING *NAUSEA AND VOMITING THAT IS NOT CONTROLLED WITH YOUR NAUSEA MEDICATION *UNUSUAL SHORTNESS OF BREATH *UNUSUAL BRUISING OR BLEEDING *URINARY PROBLEMS (pain or burning when urinating, or frequent urination) *BOWEL PROBLEMS (unusual diarrhea, constipation, pain near the anus) TENDERNESS IN MOUTH AND THROAT WITH OR WITHOUT PRESENCE OF ULCERS (sore throat, sores in mouth, or a toothache) UNUSUAL RASH, SWELLING OR PAIN  UNUSUAL VAGINAL DISCHARGE OR ITCHING   Items with * indicate a potential emergency and should be followed up as soon as possible or go to the Emergency Department if any problems should occur.  Please show the CHEMOTHERAPY  ALERT CARD or IMMUNOTHERAPY ALERT CARD at check-in to the Emergency Department and triage nurse.  Should you have questions after your visit or need to cancel or reschedule your appointment, please contact [facility] - A DEPT OF [facility]  Dept: [phone]  and follow the prompts.  Office hours are 8:00 a.m. to 4:30 p.m. Monday - Friday. Please note that voicemails left after 4:00 p.m. may not be returned until the following business day.  We are closed weekends and major holidays. You have access to a nurse at all times for urgent questions. Please call the main number to the clinic Dept: [phone] and follow the prompts.   For any non-urgent questions, you may also contact your provider using MyChart. We now offer e-Visits for anyone 42 and older to request care online for non-urgent symptoms. For details visit [email or web address].   Also download the MyChart app! Go to the app store, search "MyChart", open the app, select [facility], and log in with your MyChart username and password.  Doxorubicin Liposomal Injection What is this medication? DOXORUBICIN LIPOSOMAL (dox oh ROO bi sin LIP oh som al) treats some types of cancer. It works by slowing down the growth of cancer cells. This medicine may be used for other purposes; ask your health care provider or pharmacist if you have questions. COMMON BRAND NAME(S): Doxil, Lipodox What should I tell my care team before I take this medication? They need to know if you have any of these conditions: Blood disorder Heart disease Infection especially a viral infection, such as chickenpox,  cold sores, herpes Liver disease Recent or ongoing radiation An unusual or allergic reaction to doxorubicin, soybeans, other medications, foods, dyes, or preservatives If you or your partner are pregnant or trying to get pregnant Breast-feeding How should I use this medication? This medication is injected into a vein. It  is given by your care team in a hospital or clinic setting. Talk to your care team about the use of this medication in children. Special care may be needed. Overdosage: If you think you have taken too much of this medicine contact a poison control center or emergency room at once. NOTE: This medicine is only for you. Do not share this medicine with others. What if I miss a dose? Keep appointments for follow-up doses. It is important not to miss your dose. Call your care team if you are unable to keep an appointment. What may interact with this medication? Do not take this medication with any of the following: Zidovudine This medication may also interact with the following: Medications to increase blood counts, such as filgrastim, pegfilgrastim, sargramostim Vaccines This list may not describe all possible interactions. Give your health care provider a list of all the medicines, herbs, non-prescription drugs, or dietary supplements you use. Also tell them if you smoke, drink alcohol, or use illegal drugs. Some items may interact with your medicine. What should I watch for while using this medication? Your condition will be monitored carefully while you are receiving this medication. You may need blood work while taking this medication. This medication may make you feel generally unwell. This is not uncommon as chemotherapy can affect healthy cells as well as cancer cells. Report any side effects. Continue your course of treatment even though you feel ill unless your care team tells you to stop. Your urine may turn orange-red for a few days after your dose. This is not blood. If your urine is dark or brown, call your care team. In some cases, you may be given additional medications to help with side effects. Follow all directions for their use. Talk to your care team about your risk of cancer. You may be more at risk for certain types of cancers if you take this medication. Talk to your care team if  you or your partner may be pregnant. Serious birth defects can occur if you take this medication during pregnancy and for 6 months after the last dose. Contraception is recommended while taking this medication and for 6 months after the last dose. Your care team can help you find the option that works for you. If your partner can get pregnant, use a condom while taking this medication and for 6 months after the last dose. Do not breastfeed while taking this medication. This medication may cause infertility. Talk to your care team if you are concerned about your fertility. What side effects may I notice from receiving this medication? Side effects that you should report to your care team as soon as possible: Allergic reactions--skin rash, itching, hives, swelling of the face, lips, tongue, or throat Heart failure--shortness of breath, swelling of the ankles, feet, or hands, sudden weight gain, unusual weakness or fatigue Infection--fever, chills, cough, sore throat, wounds that don't heal, pain or trouble when passing urine, general feeling of discomfort or being unwell Infusion reactions--chest pain, shortness of breath or trouble breathing, feeling faint or lightheaded Low red blood cell level--unusual weakness or fatigue, dizziness, headache, trouble breathing Redness, swelling, and blistering of the skin over hands and feet Unusual bruising  or bleeding Side effects that usually do not require medical attention (report to your care team if they continue or are bothersome): Constipation Diarrhea Loss of appetite Nausea Pain, redness, or swelling with sores inside the mouth or throat Red urine Unusual weakness or fatigue This list may not describe all possible side effects. Call your doctor for medical advice about side effects. You may report side effects to FDA at 1-800-FDA-1088. Where should I keep my medication? This medication is given in a hospital or clinic. It will not be stored at  home. NOTE: This sheet is a summary. It may not cover all possible information. If you have questions about this medicine, talk to your doctor, pharmacist, or health care provider.  2024 Elsevier/Gold Standard (2022-12-23 00:00:00)

## 2023-11-04 LAB — CA 125: Cancer Antigen (CA) 125: 83.6 U/mL — ABNORMAL HIGH (ref 0.0–38.1)

## 2023-11-10 DIAGNOSIS — H524 Presbyopia: Secondary | ICD-10-CM | POA: Diagnosis not present

## 2023-11-14 ENCOUNTER — Telehealth: Payer: Self-pay | Admitting: *Deleted

## 2023-11-14 NOTE — Telephone Encounter (Signed)
 Per Dr. Scherrie Curt: Start liquid diet only and low residue. Continue stool softener. Could be trying to develop a bowel obstruction. Call with update tomorrow on status. If still having GI issues tomorrow, may need to order CT scan of abdomen/pelvis. She agrees to instructions and to call tomorrow.

## 2023-11-14 NOTE — Telephone Encounter (Signed)
 Ms. Ludlum called to report she is having abdominal cramping (sharp pains) with some mild nausea. She takes MiraLax  every other day and Colace daily, but will then have diarrhea and takes Pepto Bismol and this will constipate her, but helps the cramping. Has no appetite and not eating much-weight this am was 143 lb at home. Having some mild nausea, but is taking her her Compazine /zofran  with no emesis. Is just not hungry. Thinks she is only drinking ~ 16 oz fluids/day--instructed her she need to push herself to drink at least 48-64 oz fluid day to stay hydrated. Consume small snacks 6-8 times day and continue antiemetics. She will call tomorrow if she can't get her fluid intake better. Denies fever or abdominal distention.

## 2023-11-15 ENCOUNTER — Telehealth: Payer: Self-pay | Admitting: *Deleted

## 2023-11-15 NOTE — Telephone Encounter (Signed)
[name] [name] called to report she has been staying with liquid diet, but still nauseated. Has only been taking her compazine bid: encouraged her to take it every 6 hours as needed and can add Zofran if necessary. She has not vomited. Reports persistent abdominal pain and abdomen feels "sore". Bowel sounds not as hyperactive today as yesterday, but has been having more gas than normal. Has had soft stool today w/no diarrhea. Ate a few saltine crackers w/chicken broth and heating pad helps pain some.

## 2023-11-15 NOTE — Telephone Encounter (Signed)
Called patient back w/appointment to see NP tomorrow at 11:00. She agrees.

## 2023-11-16 ENCOUNTER — Inpatient Hospital Stay: Payer: Medicare Other

## 2023-11-16 ENCOUNTER — Inpatient Hospital Stay: Payer: 59 | Attending: Oncology | Admitting: Nurse Practitioner

## 2023-11-16 ENCOUNTER — Encounter: Payer: Self-pay | Admitting: Nurse Practitioner

## 2023-11-16 ENCOUNTER — Ambulatory Visit (HOSPITAL_BASED_OUTPATIENT_CLINIC_OR_DEPARTMENT_OTHER)
Admission: RE | Admit: 2023-11-16 | Discharge: 2023-11-16 | Disposition: A | Discharge status: Home / Self Care | Payer: Medicare Other | Source: Ambulatory Visit | Attending: Nurse Practitioner | Admitting: Nurse Practitioner

## 2023-11-16 VITALS — BP 112/73 | HR 75 | Temp 98.3°F | Resp 18 | Ht 64.0 in | Wt 143.1 lb

## 2023-11-16 DIAGNOSIS — M549 Dorsalgia, unspecified: Secondary | ICD-10-CM | POA: Insufficient documentation

## 2023-11-16 DIAGNOSIS — M542 Cervicalgia: Secondary | ICD-10-CM | POA: Insufficient documentation

## 2023-11-16 DIAGNOSIS — G8929 Other chronic pain: Secondary | ICD-10-CM | POA: Diagnosis not present

## 2023-11-16 DIAGNOSIS — C786 Secondary malignant neoplasm of retroperitoneum and peritoneum: Secondary | ICD-10-CM | POA: Insufficient documentation

## 2023-11-16 DIAGNOSIS — Z5111 Encounter for antineoplastic chemotherapy: Secondary | ICD-10-CM | POA: Diagnosis not present

## 2023-11-16 DIAGNOSIS — K5900 Constipation, unspecified: Secondary | ICD-10-CM | POA: Diagnosis not present

## 2023-11-16 DIAGNOSIS — D701 Agranulocytosis secondary to cancer chemotherapy: Secondary | ICD-10-CM | POA: Diagnosis not present

## 2023-11-16 DIAGNOSIS — C569 Malignant neoplasm of unspecified ovary: Secondary | ICD-10-CM | POA: Insufficient documentation

## 2023-11-16 DIAGNOSIS — G620 Drug-induced polyneuropathy: Secondary | ICD-10-CM | POA: Insufficient documentation

## 2023-11-16 DIAGNOSIS — Z95828 Presence of other vascular implants and grafts: Secondary | ICD-10-CM

## 2023-11-16 DIAGNOSIS — R6881 Early satiety: Secondary | ICD-10-CM | POA: Insufficient documentation

## 2023-11-16 DIAGNOSIS — K449 Diaphragmatic hernia without obstruction or gangrene: Secondary | ICD-10-CM | POA: Diagnosis not present

## 2023-11-16 DIAGNOSIS — K219 Gastro-esophageal reflux disease without esophagitis: Secondary | ICD-10-CM | POA: Diagnosis not present

## 2023-11-16 DIAGNOSIS — C482 Malignant neoplasm of peritoneum, unspecified: Secondary | ICD-10-CM | POA: Diagnosis not present

## 2023-11-16 LAB — CMP (CANCER CENTER ONLY)
ALT: 11 U/L (ref 0–44)
AST: 15 U/L (ref 15–41)
Albumin: 3.5 g/dL (ref 3.5–5.0)
Alkaline Phosphatase: 47 U/L (ref 38–126)
Anion gap: 8 (ref 5–15)
BUN: 13 mg/dL (ref 8–23)
CO2: 28 mmol/L (ref 22–32)
Calcium: 9.2 mg/dL (ref 8.9–10.3)
Chloride: 103 mmol/L (ref 98–111)
Creatinine: 0.6 mg/dL (ref 0.44–1.00)
GFR, Estimated: >60 mL/min (ref >60)
Glucose, Bld: 86 mg/dL (ref 70–99)
Potassium: 4.3 mmol/L (ref 3.5–5.1)
Sodium: 139 mmol/L (ref 135–145)
Total Bilirubin: 0.3 mg/dL (ref 0.0–1.2)
Total Protein: 6.8 g/dL (ref 6.5–8.1)

## 2023-11-16 LAB — CBC WITH DIFFERENTIAL (CANCER CENTER ONLY)
Abs Immature Granulocytes: 0.05 10*3/uL (ref 0.00–0.07)
Basophils Absolute: 0.1 10*3/uL (ref 0.0–0.1)
Basophils Relative: 1 %
Eosinophils Absolute: 0.1 10*3/uL (ref 0.0–0.5)
Eosinophils Relative: 2 %
HCT: 40.7 % (ref 36.0–46.0)
Hemoglobin: 12.7 g/dL (ref 12.0–15.0)
Immature Granulocytes: 1 %
Lymphocytes Relative: 17 %
Lymphs Abs: 0.9 10*3/uL (ref 0.7–4.0)
MCH: 31 pg (ref 26.0–34.0)
MCHC: 31.2 g/dL (ref 30.0–36.0)
MCV: 99.3 fL (ref 80.0–100.0)
Monocytes Absolute: 0.3 10*3/uL (ref 0.1–1.0)
Monocytes Relative: 6 %
Neutro Abs: 3.7 10*3/uL (ref 1.7–7.7)
Neutrophils Relative %: 73 %
Platelet Count: 429 10*3/uL — ABNORMAL HIGH (ref 150–400)
RBC: 4.1 MIL/uL (ref 3.87–5.11)
RDW: 13.4 % (ref 11.5–15.5)
WBC Count: 5.1 10*3/uL (ref 4.0–10.5)
nRBC: 0 % (ref 0.0–0.2)

## 2023-11-16 MED ORDER — TRAMADOL-ACETAMINOPHEN 37.5-325 MG PO TABS: 1.0000 | ORAL_TABLET | Freq: Three times a day (TID) | ORAL | 0 refills | Status: DC | PRN

## 2023-11-16 MED ORDER — SODIUM CHLORIDE 0.9% FLUSH: 10.0000 mL | INTRAVENOUS | Status: AC | PRN

## 2023-11-16 MED ORDER — HEPARIN SOD (PORK) LOCK FLUSH 100 UNIT/ML IV SOLN: 500.0000 [IU] | Freq: Once | INTRAVENOUS | Status: AC | PRN

## 2023-11-16 MED ORDER — IOHEXOL 300 MG/ML  SOLN
100.0000 mL | Freq: Once | INTRAMUSCULAR | Status: AC | PRN
  Administered 2023-11-16: 85 mL via INTRAVENOUS

## 2023-11-16 NOTE — Progress Notes (Signed)
 [facility] OFFICE PROGRESS NOTE   Diagnosis: Ovarian cancer  INTERVAL HISTORY:   [name] [name] returns prior to scheduled follow-up.  She completed cycle 2 Doxil 11/03/2023.  She reports fairly consistent nausea for the past week.  No vomiting.  She has early satiety.  She is experiencing constipation.  She takes a laxative and then gets diarrhea.  No rectal bleeding.  No fever.  Her abdomen feels distended and "sore".  Objective:  Vital signs in last 24 hours:  Blood pressure 112/73, pulse 75, temperature 98.3 F (36.8 C), temperature source Temporal, resp. rate 18, height 5\' 4"  (1.626 m), weight 143 lb 1.6 oz (64.9 kg), SpO2 97%.    HEENT: No thrush or ulcers. Resp: Lungs clear bilaterally. Cardio: Regular rate and rhythm. GI: Hypoactive bowel sounds.  Abdomen appears mildly distended, generalized mild tenderness.  Fullness at the left upper abdomen.  No hepatomegaly.   Vascular: No leg edema. Port-A-Cath without erythema.  Lab Results:  Lab Results  Component Value Date   WBC 7.2 11/03/2023   HGB 11.9 (L) 11/03/2023   HCT 36.6 11/03/2023   MCV 97.3 11/03/2023   PLT 358 11/03/2023   NEUTROABS 5.3 11/03/2023    Imaging:  No results found.  Medications: I have reviewed the patient's current medications.  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 A 5 mm nodules remain on the right diaphragm. - TumorNext paired germline/tumor analyses: No somatic variants detected, germline CHEK2      VUS      Cycle 1 of adjuvant Taxol/carboplatin chemotherapy initiated on 09/19/2012.   The CA 125 normalized.   She completed day 15 of cycle 6 on 02/06/2013.   Restaging CT evaluation 03/29/2013 showed no evidence of metastatic disease in the chest. There was marked improvement in appearance/resolution of  previous described peritoneal/omental disease. There was no convincing evidence of residual disease. There was minimal increased density in the region of the omentum favored to be treatment-related. There was no pelvic adenopathy. CA125 3.2 on 02/19/2014. 08/18/2016 CA-125 8 CT abdomen/pelvis 08/25/2016-solitary new enlarged right external iliac lymph node measuring 2.2 cm. Biopsy 09/07/2016-adenocarcinoma consistent with high-grade serous carcinoma.  ER +90%, PR -0%, HER2 negative (0); foundation 1-HRD not detected, microsatellite status cannot be determined, tumor mutation burden 8, FOLR1 positive-80%.   PET scan 09/22/2016-malignant range FDG uptake associated with the enlarged right external iliac lymph node compatible with metastatic adenopathy.  No additional sites of metastatic disease identified. Radiation 10/25/2016-12/01/2016 PET scan 03/28/2017-previously enlarged hypermetabolic right external iliac node-normal in size with resolution of hypermetabolic activity, no active malignancy identified PET scan 05/15/2018-no evidence of recurrent or metastatic disease, no hypermetabolic lymph nodes CT abdomen/pelvis 09/19/2019-mild increase in the size of several left upper quadrant peritoneal nodules measuring up to 11 mm.  No other sites of metastatic disease identified.  No ascites. CT abdomen/pelvis 03/14/2020-slight enlargement of several left upper quadrant peritoneal nodules, no ascites, no other evidence of disease progression CT abdomen/pelvis 09/12/2020 peritoneal nodularity/omental caking predominantly in the left upper/mid abdomen, mildly progressive.  Largest implant in the left upper abdomen adjacent to the stomach now measures 17 mm, previously 13 mm.  Overall volume of peritoneal disease has mildly progressed. CT abdomen/pelvis 01/13/2021- 4 mm right ureteral calculus with moderate right hydronephrosis and upper right hydroureter, progressive omental nodularity, stable calcified and partially  calcified right pelvic nodules CT abdomen/pelvis 05/13/2021-enlargement of left upper quadrant  omental nodules and a peritoneal nodule at the right iliac fossa, no ascites CT abdomen/pelvis 08/14/2021-mild increase in size of omental nodules, no ascites, no new site of metastatic disease Taxol/carboplatin 09/09/2021, 09/15/2021, 09/22/2021 Taxol/carboplatin 10/07/2021, 10/13/2021, 10/20/2021 Taxol/Carboplatin 11/03/2021, 11/10/2021, 11/17/2021 CT 11/23/2021-decrease in peritoneal metastases, no new or progressive disease, airspace opacity at both lung bases Taxol/carboplatin 12/01/2021, 12/08/2021, 12/15/2021 Taxol/Carboplatin every 2 weeks beginning 12/29/2021 Carboplatin alone 01/26/2022, Taxol held due to neuropathy Carboplatin alone 02/09/2022, Taxol held due to neuropathy Carboplatin alone 02/23/2022, Taxol held due to neuropathy Carboplatin alone 03/09/2022, Taxol held due to neuropathy CTs 03/25/2022-no significant change in omental, mesenteric disease.  No new or clearly progressive findings.  Previously noted patchy areas of consolidative opacity at the lung bases have resolved. Treatment break beginning 03/26/2022 CT/pelvis 08/10/2022-increased size of peritoneal nodules, increased small volume ascites, mild right lower lobe pleural nodularity Cycle 1 salvage carboplatin 09/14/2022 (weekly x 3 followed by 1 week break) Cycle 2 salvage carboplatin 10/12/2022 (weekly x 3 followed by 1 week break) Cycle 3 salvage carboplatin 11/16/2022 (weekly x 3 followed by 1 week break) CT abdomen/pelvis 12/08/2022-mild increase in peritoneal carcinomatosis Tamoxifen 20 mg daily 01/07/2023 CT abdomen/pelvis 03/29/2023-increased ascites with persistent diffuse peritoneal nodularity, largest omental implants have decreased in size.  No enlarging implants.,  No evidence of bowel obstruction Paracentesis for 4.5 L 06/29/2023 CTs 08/02/2023-peritoneal carcinomatosis similar to slightly improved.  Stable volume of ascites.  No evidence of  metastatic disease in the chest. Tamoxifen 20 mg daily continued 08/02/2023 Paracentesis 08/25/2023-4 L of fluid removed, cytology shows adenocarcinoma Plan for Doxil Baseline 2D echo 09/23/2023-LVEF 55 to 60%.  Left ventricle has normal function.  Abnormal strain with apical sparing, follow-up TTE/MRI suggested in 3 months. Cycle 1 Doxil 10/06/2023 Cycle 2 Doxil 11/03/2023   2. Low abdomen/suprapubic pain prior to the exploratory laparotomy-likely secondary to omental/pelvic tumor; persistent mild pain in the lower abdomen   3. Chronic neck and back pain.   4. Anxiety -persistent despite Lexapro and Xanax. She has been evaluated by psychiatry. Currently on Lexapro. 5. Status post Port-A-Cath placement 09/22/2012. The Port-A-Cath was removed on 04/03/2013.   6. Neutropenia secondary to chemotherapy- day 15 cycle 1 and cycle 3. Taxol/carboplatin not given secondary to neutropenia.    7. Herpes zoster involving a right thoracic dermatome July 2015. She completed a course of Valtrex. 8. Nodular bony prominence at the left pelvis on rectal exam 06/05/2015-likely a benign finding 9.  Right ureter stone/hydroureteronephrosis on CT 01/13/2021-referred to urology; status post right ureteroscopy with stone extraction and ureteral stent placement.  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  Right hip fracture following a fall 11/02/2022-status post right hip anterior hemiarthroplasty 12.  Acute localized thrombus right proximal femoral vein 11/16/2022 Eliquis initiated 13.  Admission 06/24/2023 with volume overload      Disposition: [name] [name] has completed 2 cycles of Doxil.  She seems to be tolerating treatment well.  She has worsening nausea, bloating, constipation which are likely related to carcinomatosis including.  We are providing her with information on a low residue diet.  She will begin taking MiraLAX and a stool softener daily.  She will continue Compazine and Zofran as needed.  Prescription  sent to her pharmacy for Ultracet.  She understands she should not drive while taking pain medication.  We referred her for restaging CT scans.  She will return for follow-up in 1 week.  We are available to see her sooner if needed.  Patient seen with Dr.  Sherrill.    Lonna Cobb ANP/GNP-BC   11/16/2023  11:28 AM This was a shared visit with Lonna Cobb.  [name] [name] was interviewed and examined.  She has completed 2 cycles of Doxil.  [name] [name] has developed obstructive symptoms.  We will refer her for restaging CTs.  We discussed salvage treatment options.  She does not want to consider treatment with mirvetuximab due to the potential for ophthalmologic toxicity.  I was present for greater than 50% of today's visit.  I performed medical decision making.  Mancel Bale, MD

## 2023-11-23 ENCOUNTER — Inpatient Hospital Stay (HOSPITAL_BASED_OUTPATIENT_CLINIC_OR_DEPARTMENT_OTHER): Payer: Medicare Other | Admitting: Oncology

## 2023-11-23 VITALS — BP 111/76 | HR 83 | Temp 98.1°F | Resp 18 | Ht 64.0 in | Wt 143.1 lb

## 2023-11-23 DIAGNOSIS — G8929 Other chronic pain: Secondary | ICD-10-CM | POA: Diagnosis not present

## 2023-11-23 DIAGNOSIS — M549 Dorsalgia, unspecified: Secondary | ICD-10-CM | POA: Diagnosis not present

## 2023-11-23 DIAGNOSIS — G620 Drug-induced polyneuropathy: Secondary | ICD-10-CM | POA: Diagnosis not present

## 2023-11-23 DIAGNOSIS — Z5111 Encounter for antineoplastic chemotherapy: Secondary | ICD-10-CM | POA: Diagnosis not present

## 2023-11-23 DIAGNOSIS — D701 Agranulocytosis secondary to cancer chemotherapy: Secondary | ICD-10-CM | POA: Diagnosis not present

## 2023-11-23 DIAGNOSIS — C569 Malignant neoplasm of unspecified ovary: Secondary | ICD-10-CM

## 2023-11-23 DIAGNOSIS — R6881 Early satiety: Secondary | ICD-10-CM | POA: Diagnosis not present

## 2023-11-23 DIAGNOSIS — K5900 Constipation, unspecified: Secondary | ICD-10-CM | POA: Diagnosis not present

## 2023-11-23 DIAGNOSIS — M542 Cervicalgia: Secondary | ICD-10-CM | POA: Diagnosis not present

## 2023-11-23 DIAGNOSIS — C786 Secondary malignant neoplasm of retroperitoneum and peritoneum: Secondary | ICD-10-CM | POA: Diagnosis not present

## 2023-11-23 NOTE — Progress Notes (Signed)
 [facility] OFFICE PROGRESS NOTE   Diagnosis: Ovarian cancer  INTERVAL HISTORY:   [name] [name] returns as scheduled.  She feels better.  Nausea has improved.  She is having bowel movements since beginning daily MiraLAX.  She was able to go on a trip to the mountains over the weekend.  She felt well.  Objective:  Vital signs in last 24 hours:  Blood pressure 111/76, pulse 83, temperature 98.1 F (36.7 C), temperature source Temporal, resp. rate 18, height 5\' 4"  (1.626 m), weight 143 lb 1.6 oz (64.9 kg), SpO2 100%.    HEENT: No thrush or ulcers Resp: Lungs clear bilaterally Cardio: Regular rate and rhythm GI: Mildly distended, mild tenderness in the right lower abdomen, no mass, no hepatosplenomegaly Vascular: No leg edema  Skin: Hyperpigmentation of the bilateral axillae, mild hyperpigmented rash at the lower abdomen skin fold  Portacath/PICC-without erythema  Lab Results:  Lab Results  Component Value Date   WBC 5.1 11/16/2023   HGB 12.7 11/16/2023   HCT 40.7 11/16/2023   MCV 99.3 11/16/2023   PLT 429 (H) 11/16/2023   NEUTROABS 3.7 11/16/2023    CMP  Lab Results  Component Value Date   NA 139 11/16/2023   K 4.3 11/16/2023   CL 103 11/16/2023   CO2 28 11/16/2023   GLUCOSE 86 11/16/2023   BUN 13 11/16/2023   CREATININE 0.60 11/16/2023   CALCIUM 9.2 11/16/2023   PROT 6.8 11/16/2023   ALBUMIN 3.5 11/16/2023   AST 15 11/16/2023   ALT 11 11/16/2023   ALKPHOS 47 11/16/2023   BILITOT 0.3 11/16/2023   GFRNONAA >60 11/16/2023   GFRAA >60 03/13/2020    Lab Results  Component Value Date   CEA 0.8 07/13/2012   CA125 8 08/18/2016    Lab Results  Component Value Date   INR 1.0 11/02/2022   LABPROT 12.9 11/02/2022    Imaging:  No results found.  Medications: I have reviewed the patient's current medications.   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 A 5 mm nodules remain on the right diaphragm. - TumorNext paired germline/tumor analyses: No somatic variants detected, germline CHEK2      VUS      Cycle 1 of adjuvant Taxol/carboplatin chemotherapy initiated on 09/19/2012.   The CA 125 normalized.   She completed day 15 of cycle 6 on 02/06/2013.   Restaging CT evaluation 03/29/2013 showed no evidence of metastatic disease in the chest. There was marked improvement in appearance/resolution of previous described peritoneal/omental disease. There was no convincing evidence of residual disease. There was minimal increased density in the region of the omentum favored to be treatment-related. There was no pelvic adenopathy. CA125 3.2 on 02/19/2014. 08/18/2016 CA-125 8 CT abdomen/pelvis 08/25/2016-solitary new enlarged right external iliac lymph node measuring 2.2 cm. Biopsy 09/07/2016-adenocarcinoma consistent with high-grade serous carcinoma.  ER +90%, PR -0%, HER2 negative (0); foundation 1-HRD not detected, microsatellite status cannot be determined, tumor mutation burden 8, FOLR1 positive-80%.   PET scan 09/22/2016-malignant range FDG uptake associated with the enlarged right external iliac lymph node compatible with metastatic adenopathy.  No additional sites of metastatic disease identified. Radiation 10/25/2016-12/01/2016 PET scan 03/28/2017-previously enlarged hypermetabolic right external iliac node-normal in size with resolution of hypermetabolic activity, no active malignancy identified PET scan 05/15/2018-no evidence of recurrent or metastatic disease, no hypermetabolic lymph nodes CT abdomen/pelvis 09/19/2019-mild increase in the size of  several left upper quadrant peritoneal nodules measuring up to 11 mm.  No other sites of metastatic disease identified.  No ascites. CT abdomen/pelvis 03/14/2020-slight enlargement of several left upper quadrant peritoneal  nodules, no ascites, no other evidence of disease progression CT abdomen/pelvis 09/12/2020 peritoneal nodularity/omental caking predominantly in the left upper/mid abdomen, mildly progressive.  Largest implant in the left upper abdomen adjacent to the stomach now measures 17 mm, previously 13 mm.  Overall volume of peritoneal disease has mildly progressed. CT abdomen/pelvis 01/13/2021- 4 mm right ureteral calculus with moderate right hydronephrosis and upper right hydroureter, progressive omental nodularity, stable calcified and partially calcified right pelvic nodules CT abdomen/pelvis 05/13/2021-enlargement of left upper quadrant omental nodules and a peritoneal nodule at the right iliac fossa, no ascites CT abdomen/pelvis 08/14/2021-mild increase in size of omental nodules, no ascites, no new site of metastatic disease Taxol/carboplatin 09/09/2021, 09/15/2021, 09/22/2021 Taxol/carboplatin 10/07/2021, 10/13/2021, 10/20/2021 Taxol/Carboplatin 11/03/2021, 11/10/2021, 11/17/2021 CT 11/23/2021-decrease in peritoneal metastases, no new or progressive disease, airspace opacity at both lung bases Taxol/carboplatin 12/01/2021, 12/08/2021, 12/15/2021 Taxol/Carboplatin every 2 weeks beginning 12/29/2021 Carboplatin alone 01/26/2022, Taxol held due to neuropathy Carboplatin alone 02/09/2022, Taxol held due to neuropathy Carboplatin alone 02/23/2022, Taxol held due to neuropathy Carboplatin alone 03/09/2022, Taxol held due to neuropathy CTs 03/25/2022-no significant change in omental, mesenteric disease.  No new or clearly progressive findings.  Previously noted patchy areas of consolidative opacity at the lung bases have resolved. Treatment break beginning 03/26/2022 CT/pelvis 08/10/2022-increased size of peritoneal nodules, increased small volume ascites, mild right lower lobe pleural nodularity Cycle 1 salvage carboplatin 09/14/2022 (weekly x 3 followed by 1 week break) Cycle 2 salvage carboplatin 10/12/2022 (weekly x 3 followed by  1 week break) Cycle 3 salvage carboplatin 11/16/2022 (weekly x 3 followed by 1 week break) CT abdomen/pelvis 12/08/2022-mild increase in peritoneal carcinomatosis Tamoxifen 20 mg daily 01/07/2023 CT abdomen/pelvis 03/29/2023-increased ascites with persistent diffuse peritoneal nodularity, largest omental implants have decreased in size.  No enlarging implants.,  No evidence of bowel obstruction Paracentesis for 4.5 L 06/29/2023 CTs 08/02/2023-peritoneal carcinomatosis similar to slightly improved.  Stable volume of ascites.  No evidence of metastatic disease in the chest. Tamoxifen 20 mg daily continued 08/02/2023 Paracentesis 08/25/2023-4 L of fluid removed, cytology shows adenocarcinoma Plan for Doxil Baseline 2D echo 09/23/2023-LVEF 55 to 60%.  Left ventricle has normal function.  Abnormal strain with apical sparing, follow-up TTE/MRI suggested in 3 months. Cycle 1 Doxil 10/06/2023 Cycle 2 Doxil 11/03/2023 CT abdomen/pelvis 11/16/2023: Compared to 08/02/2023 there is overall stable disease, new 0.9 cm nodule at the left descending colon, I reviewed the CT images.  Multiple lesions appear unchanged.  Stable ascites and peritoneal thickening   2. Low abdomen/suprapubic pain prior to the exploratory laparotomy-likely secondary to omental/pelvic tumor; persistent mild pain in the lower abdomen   3. Chronic neck and back pain.   4. Anxiety -persistent despite Lexapro and Xanax. She has been evaluated by psychiatry. Currently on Lexapro. 5. Status post Port-A-Cath placement 09/22/2012. The Port-A-Cath was removed on 04/03/2013.   6. Neutropenia secondary to chemotherapy- day 15 cycle 1 and cycle 3. Taxol/carboplatin not given secondary to neutropenia.    7. Herpes zoster involving a right thoracic dermatome July 2015. She completed a course of Valtrex. 8. Nodular bony prominence at the left pelvis on rectal exam 06/05/2015-likely a benign finding 9.  Right ureter stone/hydroureteronephrosis on CT  01/13/2021-referred to urology; status post right ureteroscopy with stone extraction and ureteral stent placement.  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  Right hip fracture following a fall 11/02/2022-status post right hip anterior hemiarthroplasty 12.  Acute localized thrombus right proximal femoral vein 11/16/2022 Eliquis initiated 13.  Admission 06/24/2023 with volume overload       Disposition: [name] [name] has metastatic ovarian cancer.  She has completed 2 cycles of Doxil chemotherapy.  She had increased nausea and constipation when we saw her last week.  The symptoms have improved with a laxative regimen.  I reviewed the restaging CT findings and images with her.  I measured multiple peritoneal nodules.  The lesion at the left descending colon may be needed, but other lesions appear stable.  There was a greater than 29-month gap between the baseline CT and starting Doxil.  I recommend continuing Doxil.  She is in agreement.  She will return for an office visit and cycle 3 Doxil on 11/30/2022.  Thornton Papas, MD  11/23/2023  11:24 AM

## 2023-12-01 ENCOUNTER — Inpatient Hospital Stay (HOSPITAL_BASED_OUTPATIENT_CLINIC_OR_DEPARTMENT_OTHER): Payer: Medicare Other | Admitting: Nurse Practitioner

## 2023-12-01 ENCOUNTER — Inpatient Hospital Stay: Payer: Medicare Other

## 2023-12-01 ENCOUNTER — Encounter: Payer: Self-pay | Admitting: Nurse Practitioner

## 2023-12-01 VITALS — BP 117/81 | HR 81 | Temp 98.1°F | Resp 18 | Ht 64.0 in | Wt 143.6 lb

## 2023-12-01 DIAGNOSIS — R6881 Early satiety: Secondary | ICD-10-CM | POA: Diagnosis not present

## 2023-12-01 DIAGNOSIS — Z5111 Encounter for antineoplastic chemotherapy: Secondary | ICD-10-CM | POA: Diagnosis not present

## 2023-12-01 DIAGNOSIS — C569 Malignant neoplasm of unspecified ovary: Secondary | ICD-10-CM

## 2023-12-01 DIAGNOSIS — M549 Dorsalgia, unspecified: Secondary | ICD-10-CM | POA: Diagnosis not present

## 2023-12-01 DIAGNOSIS — D701 Agranulocytosis secondary to cancer chemotherapy: Secondary | ICD-10-CM | POA: Diagnosis not present

## 2023-12-01 DIAGNOSIS — G620 Drug-induced polyneuropathy: Secondary | ICD-10-CM | POA: Diagnosis not present

## 2023-12-01 DIAGNOSIS — K5900 Constipation, unspecified: Secondary | ICD-10-CM | POA: Diagnosis not present

## 2023-12-01 DIAGNOSIS — C786 Secondary malignant neoplasm of retroperitoneum and peritoneum: Secondary | ICD-10-CM | POA: Diagnosis not present

## 2023-12-01 DIAGNOSIS — M542 Cervicalgia: Secondary | ICD-10-CM | POA: Diagnosis not present

## 2023-12-01 DIAGNOSIS — G8929 Other chronic pain: Secondary | ICD-10-CM | POA: Diagnosis not present

## 2023-12-01 LAB — CMP (CANCER CENTER ONLY)
ALT: 13 U/L (ref 0–44)
AST: 16 U/L (ref 15–41)
Albumin: 4 g/dL (ref 3.5–5.0)
Alkaline Phosphatase: 53 U/L (ref 38–126)
Anion gap: 9 (ref 5–15)
BUN: 25 mg/dL — ABNORMAL HIGH (ref 8–23)
CO2: 26 mmol/L (ref 22–32)
Calcium: 8.9 mg/dL (ref 8.9–10.3)
Chloride: 105 mmol/L (ref 98–111)
Creatinine: 0.6 mg/dL (ref 0.44–1.00)
GFR, Estimated: >60 mL/min (ref >60)
Glucose, Bld: 79 mg/dL (ref 70–99)
Potassium: 4.1 mmol/L (ref 3.5–5.1)
Sodium: 140 mmol/L (ref 135–145)
Total Bilirubin: 0.4 mg/dL (ref 0.0–1.2)
Total Protein: 6.7 g/dL (ref 6.5–8.1)

## 2023-12-01 LAB — CBC WITH DIFFERENTIAL (CANCER CENTER ONLY)
Abs Immature Granulocytes: 0.02 10*3/uL (ref 0.00–0.07)
Basophils Absolute: 0.1 10*3/uL (ref 0.0–0.1)
Basophils Relative: 1 %
Eosinophils Absolute: 0.1 10*3/uL (ref 0.0–0.5)
Eosinophils Relative: 1 %
HCT: 37.6 % (ref 36.0–46.0)
Hemoglobin: 12.1 g/dL (ref 12.0–15.0)
Immature Granulocytes: 0 %
Lymphocytes Relative: 19 %
Lymphs Abs: 1.1 10*3/uL (ref 0.7–4.0)
MCH: 31.8 pg (ref 26.0–34.0)
MCHC: 32.2 g/dL (ref 30.0–36.0)
MCV: 98.7 fL (ref 80.0–100.0)
Monocytes Absolute: 0.9 10*3/uL (ref 0.1–1.0)
Monocytes Relative: 17 %
Neutro Abs: 3.4 10*3/uL (ref 1.7–7.7)
Neutrophils Relative %: 62 %
Platelet Count: 283 10*3/uL (ref 150–400)
RBC: 3.81 MIL/uL — ABNORMAL LOW (ref 3.87–5.11)
RDW: 14.5 % (ref 11.5–15.5)
WBC Count: 5.6 10*3/uL (ref 4.0–10.5)
nRBC: 0 % (ref 0.0–0.2)

## 2023-12-01 MED ORDER — DOXORUBICIN HCL LIPOSOMAL CHEMO INJECTION 2 MG/ML
39.0000 mg/m2 | Freq: Once | INTRAVENOUS | Status: AC
  Administered 2023-12-01: 70 mg via INTRAVENOUS
  Filled 2023-12-01: qty 25

## 2023-12-01 MED ORDER — DEXTROSE 5 % IV SOLN: INTRAVENOUS | Status: DC

## 2023-12-01 MED ORDER — SODIUM CHLORIDE 0.9% FLUSH
10.0000 mL | INTRAVENOUS | Status: DC | PRN
Start: 2023-12-01 — End: 2023-12-01
  Administered 2023-12-01: 10 mL

## 2023-12-01 MED ORDER — DEXAMETHASONE SODIUM PHOSPHATE 10 MG/ML IJ SOLN
10.0000 mg | Freq: Once | INTRAMUSCULAR | Status: AC
  Administered 2023-12-01: 10 mg via INTRAVENOUS
  Filled 2023-12-01: qty 1

## 2023-12-01 MED ORDER — HEPARIN SOD (PORK) LOCK FLUSH 100 UNIT/ML IV SOLN
500.0000 [IU] | Freq: Once | INTRAVENOUS | Status: AC | PRN
  Administered 2023-12-01: 500 [IU]

## 2023-12-01 NOTE — Addendum Note (Signed)
 Addended by: Rana Snare on: 12/01/2023 10:34 AM   Modules accepted: Orders

## 2023-12-01 NOTE — Patient Instructions (Signed)

## 2023-12-01 NOTE — Progress Notes (Signed)
 Patient seen by Lonna Cobb NP today  Vitals are within treatment parameters:Yes   Labs are within treatment parameters: Yes mild erythema   Treatment plan has been signed: Yes   Per physician team, Patient is ready for treatment and there are NO modifications to the treatment plan.

## 2023-12-01 NOTE — Progress Notes (Addendum)
 [facility] OFFICE PROGRESS NOTE   Diagnosis: Ovarian cancer  INTERVAL HISTORY:   [name] returns as scheduled.  She completed cycle 2 Doxil 11/03/2023.  Restaging CTs after cycle 2 showed overall stable disease.  She feels abdomen is less distended and softer.  Bowels moving regularly with the aid of a stool softener and MiraLAX.  No nausea or vomiting.  No rash.  No hand or foot pain or redness.  Appetite described as "okay".  Energy level described as "good".  She denies bleeding.  No fever, cough, shortness of breath.  No mouth sores.  Objective:  Vital signs in last 24 hours:  Blood pressure 117/81, pulse 81, temperature 98.1 F (36.7 C), temperature source Temporal, resp. rate 18, height 5\' 4"  (1.626 m), weight 143 lb 9.6 oz (65.1 kg), SpO2 97%.    HEENT: No thrush or ulcers. Resp: Lungs clear bilaterally. Cardio: Regular rate and rhythm. GI: Abdomen is soft.  Mild tenderness right lower abdomen.  No mass.  No hepatosplenomegaly.  No apparent ascites. Vascular: No leg edema. Neuro: Alert and oriented. Skin: Palms and soles with mild erythema.  Hyperpigmentation bilateral axilla and abdominal waist level skin fold.   Lab Results:  Lab Results  Component Value Date   WBC 5.6 12/01/2023   HGB 12.1 12/01/2023   HCT 37.6 12/01/2023   MCV 98.7 12/01/2023   PLT 283 12/01/2023   NEUTROABS 3.4 12/01/2023    Imaging:  No results found.  Medications: I have reviewed the patient's current medications.  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 A 5 mm nodules remain on the right diaphragm. - TumorNext paired germline/tumor analyses: No somatic variants detected, germline CHEK2      VUS      Cycle 1 of adjuvant Taxol/carboplatin chemotherapy initiated on 09/19/2012.   The CA 125 normalized.    She completed day 15 of cycle 6 on 02/06/2013.   Restaging CT evaluation 03/29/2013 showed no evidence of metastatic disease in the chest. There was marked improvement in appearance/resolution of previous described peritoneal/omental disease. There was no convincing evidence of residual disease. There was minimal increased density in the region of the omentum favored to be treatment-related. There was no pelvic adenopathy. CA125 3.2 on 02/19/2014. 08/18/2016 CA-125 8 CT abdomen/pelvis 08/25/2016-solitary new enlarged right external iliac lymph node measuring 2.2 cm. Biopsy 09/07/2016-adenocarcinoma consistent with high-grade serous carcinoma.  ER +90%, PR -0%, HER2 negative (0); foundation 1-HRD not detected, microsatellite status cannot be determined, tumor mutation burden 8, FOLR1 positive-80%.   PET scan 09/22/2016-malignant range FDG uptake associated with the enlarged right external iliac lymph node compatible with metastatic adenopathy.  No additional sites of metastatic disease identified. Radiation 10/25/2016-12/01/2016 PET scan 03/28/2017-previously enlarged hypermetabolic right external iliac node-normal in size with resolution of hypermetabolic activity, no active malignancy identified PET scan 05/15/2018-no evidence of recurrent or metastatic disease, no hypermetabolic lymph nodes CT abdomen/pelvis 09/19/2019-mild increase in the size of several left upper quadrant peritoneal nodules measuring up to 11 mm.  No other sites of metastatic disease identified.  No ascites. CT abdomen/pelvis 03/14/2020-slight enlargement of several left upper quadrant peritoneal nodules, no ascites, no other evidence of disease progression CT abdomen/pelvis 09/12/2020 peritoneal nodularity/omental caking predominantly in the left upper/mid abdomen, mildly progressive.  Largest implant in the left upper abdomen adjacent to the stomach now measures 17 mm, previously 13 mm.  Overall volume of  peritoneal disease has  mildly progressed. CT abdomen/pelvis 01/13/2021- 4 mm right ureteral calculus with moderate right hydronephrosis and upper right hydroureter, progressive omental nodularity, stable calcified and partially calcified right pelvic nodules CT abdomen/pelvis 05/13/2021-enlargement of left upper quadrant omental nodules and a peritoneal nodule at the right iliac fossa, no ascites CT abdomen/pelvis 08/14/2021-mild increase in size of omental nodules, no ascites, no new site of metastatic disease Taxol/carboplatin 09/09/2021, 09/15/2021, 09/22/2021 Taxol/carboplatin 10/07/2021, 10/13/2021, 10/20/2021 Taxol/Carboplatin 11/03/2021, 11/10/2021, 11/17/2021 CT 11/23/2021-decrease in peritoneal metastases, no new or progressive disease, airspace opacity at both lung bases Taxol/carboplatin 12/01/2021, 12/08/2021, 12/15/2021 Taxol/Carboplatin every 2 weeks beginning 12/29/2021 Carboplatin alone 01/26/2022, Taxol held due to neuropathy Carboplatin alone 02/09/2022, Taxol held due to neuropathy Carboplatin alone 02/23/2022, Taxol held due to neuropathy Carboplatin alone 03/09/2022, Taxol held due to neuropathy CTs 03/25/2022-no significant change in omental, mesenteric disease.  No new or clearly progressive findings.  Previously noted patchy areas of consolidative opacity at the lung bases have resolved. Treatment break beginning 03/26/2022 CT/pelvis 08/10/2022-increased size of peritoneal nodules, increased small volume ascites, mild right lower lobe pleural nodularity Cycle 1 salvage carboplatin 09/14/2022 (weekly x 3 followed by 1 week break) Cycle 2 salvage carboplatin 10/12/2022 (weekly x 3 followed by 1 week break) Cycle 3 salvage carboplatin 11/16/2022 (weekly x 3 followed by 1 week break) CT abdomen/pelvis 12/08/2022-mild increase in peritoneal carcinomatosis Tamoxifen 20 mg daily 01/07/2023 CT abdomen/pelvis 03/29/2023-increased ascites with persistent diffuse peritoneal nodularity, largest omental implants have decreased in size.  No  enlarging implants.,  No evidence of bowel obstruction Paracentesis for 4.5 L 06/29/2023 CTs 08/02/2023-peritoneal carcinomatosis similar to slightly improved.  Stable volume of ascites.  No evidence of metastatic disease in the chest. Tamoxifen 20 mg daily continued 08/02/2023 Paracentesis 08/25/2023-4 L of fluid removed, cytology shows adenocarcinoma Plan for Doxil Baseline 2D echo 09/23/2023-LVEF 55 to 60%.  Left ventricle has normal function.  Abnormal strain with apical sparing, follow-up TTE/MRI suggested in 3 months. Cycle 1 Doxil 10/06/2023 Cycle 2 Doxil 11/03/2023 CT abdomen/pelvis 11/16/2023: Compared to 08/02/2023 there is overall stable disease, new 0.9 cm nodule at the left descending colon, I reviewed the CT images.  Multiple lesions appear unchanged.  Stable ascites and peritoneal thickening Cycle 3 Doxil 12/01/2023   2. Low abdomen/suprapubic pain prior to the exploratory laparotomy-likely secondary to omental/pelvic tumor; persistent mild pain in the lower abdomen   3. Chronic neck and back pain.   4. Anxiety -persistent despite Lexapro and Xanax. She has been evaluated by psychiatry. Currently on Lexapro. 5. Status post Port-A-Cath placement 09/22/2012. The Port-A-Cath was removed on 04/03/2013.   6. Neutropenia secondary to chemotherapy- day 15 cycle 1 and cycle 3. Taxol/carboplatin not given secondary to neutropenia.    7. Herpes zoster involving a right thoracic dermatome July 2015. She completed a course of Valtrex. 8. Nodular bony prominence at the left pelvis on rectal exam 06/05/2015-likely a benign finding 9.  Right ureter stone/hydroureteronephrosis on CT 01/13/2021-referred to urology; status post right ureteroscopy with stone extraction and ureteral stent placement.  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  Right hip fracture following a fall 11/02/2022-status post right hip anterior hemiarthroplasty 12.  Acute localized thrombus right proximal femoral vein  11/16/2022 Eliquis initiated 13.  Admission 06/24/2023 with volume overload    Disposition: Ms. Goffredo appears stable.  She has completed 2 cycles of Doxil.  Recent restaging CTs show overall stable disease.  Plan to proceed with cycle 3 Doxil today as scheduled.  We will follow-up  on the CA125 from today.  2D echo prior to cycle 4.  CBC reviewed.  Counts are adequate to proceed with treatment.  Chemistry panel is pending.  She has mild erythema over the palms and soles, potentially hand-foot syndrome grade 1.  She will contact the office with worsening erythema, pain, skin breakdown.  She will return for follow-up and cycle 4 Doxil in 4 weeks.  We are available to see her sooner if needed.      Lonna Cobb ANP/GNP-BC   12/01/2023  10:17 AM

## 2023-12-01 NOTE — Patient Instructions (Addendum)
 [facility]   Discharge Instructions: Thank you for choosing [facility] to provide your oncology and hematology care.   If you have a lab appointment with the Cancer Center, please go directly to the Cancer Center and check in at the registration area.   Wear comfortable clothing and clothing appropriate for easy access to any Portacath or PICC line.   We strive to give you quality time with your provider. You may need to reschedule your appointment if you arrive late (15 or more minutes).  Arriving late affects you and other patients whose appointments are after yours.  Also, if you miss three or more appointments without notifying the office, you may be dismissed from the clinic at the provider's discretion.      For prescription refill requests, have your pharmacy contact our office and allow 72 hours for refills to be completed.    Today you received the following chemotherapy and/or immunotherapy agent: Doxorubicin HCL Liposomal (DOXIL).      To help prevent nausea and vomiting after your treatment, we encourage you to take your nausea medication as directed.  BELOW ARE SYMPTOMS THAT SHOULD BE REPORTED IMMEDIATELY: *FEVER GREATER THAN 100.4 F (38 C) OR HIGHER *CHILLS OR SWEATING *NAUSEA AND VOMITING THAT IS NOT CONTROLLED WITH YOUR NAUSEA MEDICATION *UNUSUAL SHORTNESS OF BREATH *UNUSUAL BRUISING OR BLEEDING *URINARY PROBLEMS (pain or burning when urinating, or frequent urination) *BOWEL PROBLEMS (unusual diarrhea, constipation, pain near the anus) TENDERNESS IN MOUTH AND THROAT WITH OR WITHOUT PRESENCE OF ULCERS (sore throat, sores in mouth, or a toothache) UNUSUAL RASH, SWELLING OR PAIN  UNUSUAL VAGINAL DISCHARGE OR ITCHING   Items with * indicate a potential emergency and should be followed up as soon as possible or go to the Emergency Department if any problems should occur.  Please show the CHEMOTHERAPY  ALERT CARD or IMMUNOTHERAPY ALERT CARD at check-in to the Emergency Department and triage nurse.  Should you have questions after your visit or need to cancel or reschedule your appointment, please contact [facility]  Dept: [phone]  and follow the prompts.  Office hours are 8:00 a.m. to 4:30 p.m. Monday - Friday. Please note that voicemails left after 4:00 p.m. may not be returned until the following business day.  We are closed weekends and major holidays. You have access to a nurse at all times for urgent questions. Please call the main number to the clinic Dept: [phone] and follow the prompts.   For any non-urgent questions, you may also contact your provider using MyChart. We now offer e-Visits for anyone 79 and older to request care online for non-urgent symptoms. For details visit [email or web address].   Also download the MyChart app! Go to the app store, search "MyChart", open the app, select [place], and log in with your MyChart username and password.

## 2023-12-02 ENCOUNTER — Telehealth: Payer: Self-pay

## 2023-12-02 LAB — CA 125: Cancer Antigen (CA) 125: 50.6 U/mL — ABNORMAL HIGH (ref 0.0–38.1)

## 2023-12-02 NOTE — Telephone Encounter (Signed)
 Patient is schedule and aware of the appointment.

## 2023-12-02 NOTE — Telephone Encounter (Signed)
-----   Message from Lonna Cobb sent at 12/01/2023 10:34 AM EST ----- Please let her know she will need a 2D echo in approximately 3 weeks, prior to the next cycle of chemotherapy.  The order is in.  Please make sure this gets scheduled.  Thanks

## 2023-12-20 ENCOUNTER — Ambulatory Visit (INDEPENDENT_AMBULATORY_CARE_PROVIDER_SITE_OTHER): Payer: PRIVATE HEALTH INSURANCE

## 2023-12-20 DIAGNOSIS — C569 Malignant neoplasm of unspecified ovary: Secondary | ICD-10-CM | POA: Diagnosis not present

## 2023-12-20 DIAGNOSIS — Z0189 Encounter for other specified special examinations: Secondary | ICD-10-CM

## 2023-12-20 LAB — ECHOCARDIOGRAM COMPLETE
Area-P 1/2: 2.73 cm2
S' Lateral: 2.69 cm

## 2023-12-21 ENCOUNTER — Encounter: Payer: Self-pay | Admitting: Oncology

## 2023-12-24 ENCOUNTER — Other Ambulatory Visit: Payer: Self-pay | Admitting: Oncology

## 2023-12-27 ENCOUNTER — Inpatient Hospital Stay: Payer: PRIVATE HEALTH INSURANCE | Admitting: Oncology

## 2023-12-27 ENCOUNTER — Inpatient Hospital Stay: Payer: PRIVATE HEALTH INSURANCE | Attending: Oncology

## 2023-12-27 ENCOUNTER — Inpatient Hospital Stay: Payer: PRIVATE HEALTH INSURANCE

## 2023-12-27 VITALS — BP 123/77 | HR 85 | Temp 98.1°F | Resp 18 | Ht 64.0 in | Wt 146.3 lb

## 2023-12-27 DIAGNOSIS — C569 Malignant neoplasm of unspecified ovary: Secondary | ICD-10-CM

## 2023-12-27 DIAGNOSIS — Z90722 Acquired absence of ovaries, bilateral: Secondary | ICD-10-CM | POA: Diagnosis not present

## 2023-12-27 DIAGNOSIS — C786 Secondary malignant neoplasm of retroperitoneum and peritoneum: Secondary | ICD-10-CM | POA: Insufficient documentation

## 2023-12-27 DIAGNOSIS — Z90710 Acquired absence of both cervix and uterus: Secondary | ICD-10-CM | POA: Insufficient documentation

## 2023-12-27 DIAGNOSIS — Z9079 Acquired absence of other genital organ(s): Secondary | ICD-10-CM | POA: Insufficient documentation

## 2023-12-27 DIAGNOSIS — Z5111 Encounter for antineoplastic chemotherapy: Secondary | ICD-10-CM | POA: Diagnosis not present

## 2023-12-27 LAB — CBC WITH DIFFERENTIAL (CANCER CENTER ONLY)
Abs Immature Granulocytes: 0.01 10*3/uL (ref 0.00–0.07)
Basophils Absolute: 0.1 10*3/uL (ref 0.0–0.1)
Basophils Relative: 1 %
Eosinophils Absolute: 0.1 10*3/uL (ref 0.0–0.5)
Eosinophils Relative: 3 %
HCT: 37.2 % (ref 36.0–46.0)
Hemoglobin: 12.1 g/dL (ref 12.0–15.0)
Immature Granulocytes: 0 %
Lymphocytes Relative: 20 %
Lymphs Abs: 0.9 10*3/uL (ref 0.7–4.0)
MCH: 32.4 pg (ref 26.0–34.0)
MCHC: 32.5 g/dL (ref 30.0–36.0)
MCV: 99.7 fL (ref 80.0–100.0)
Monocytes Absolute: 0.7 10*3/uL (ref 0.1–1.0)
Monocytes Relative: 15 %
Neutro Abs: 2.7 10*3/uL (ref 1.7–7.7)
Neutrophils Relative %: 61 %
Platelet Count: 319 10*3/uL (ref 150–400)
RBC: 3.73 MIL/uL — ABNORMAL LOW (ref 3.87–5.11)
RDW: 15.8 % — ABNORMAL HIGH (ref 11.5–15.5)
WBC Count: 4.4 10*3/uL (ref 4.0–10.5)
nRBC: 0 % (ref 0.0–0.2)

## 2023-12-27 LAB — CMP (CANCER CENTER ONLY)
ALT: 14 U/L (ref 0–44)
AST: 15 U/L (ref 15–41)
Albumin: 3.8 g/dL (ref 3.5–5.0)
Alkaline Phosphatase: 56 U/L (ref 38–126)
Anion gap: 8 (ref 5–15)
BUN: 16 mg/dL (ref 8–23)
CO2: 26 mmol/L (ref 22–32)
Calcium: 8.9 mg/dL (ref 8.9–10.3)
Chloride: 107 mmol/L (ref 98–111)
Creatinine: 0.58 mg/dL (ref 0.44–1.00)
GFR, Estimated: >60 mL/min (ref >60)
Glucose, Bld: 129 mg/dL — ABNORMAL HIGH (ref 70–99)
Potassium: 3.9 mmol/L (ref 3.5–5.1)
Sodium: 141 mmol/L (ref 135–145)
Total Bilirubin: 0.4 mg/dL (ref 0.0–1.2)
Total Protein: 6.4 g/dL — ABNORMAL LOW (ref 6.5–8.1)

## 2023-12-27 MED ORDER — DEXTROSE 5 % IV SOLN: INTRAVENOUS | Status: DC

## 2023-12-27 MED ORDER — DOXORUBICIN HCL LIPOSOMAL CHEMO INJECTION 2 MG/ML
39.0000 mg/m2 | Freq: Once | INTRAVENOUS | Status: AC
  Administered 2023-12-27: 70 mg via INTRAVENOUS
  Filled 2023-12-27: qty 25

## 2023-12-27 MED ORDER — HEPARIN SOD (PORK) LOCK FLUSH 100 UNIT/ML IV SOLN
500.0000 [IU] | Freq: Once | INTRAVENOUS | Status: AC | PRN
  Administered 2023-12-27: 500 [IU]

## 2023-12-27 MED ORDER — DEXAMETHASONE SODIUM PHOSPHATE 10 MG/ML IJ SOLN
10.0000 mg | Freq: Once | INTRAMUSCULAR | Status: AC
Start: 2023-12-27 — End: 2023-12-27
  Administered 2023-12-27: 10 mg via INTRAVENOUS
  Filled 2023-12-27: qty 1

## 2023-12-27 MED ORDER — SODIUM CHLORIDE 0.9% FLUSH
10.0000 mL | INTRAVENOUS | Status: DC | PRN
Start: 2023-12-27 — End: 2023-12-27
  Administered 2023-12-27: 10 mL

## 2023-12-27 NOTE — Progress Notes (Signed)
 [facility] OFFICE PROGRESS NOTE   Diagnosis: Ovarian cancer  INTERVAL HISTORY:   [name] [name] completed another cycle of Doxil on 12/01/2023.  No nausea/vomiting, mouth sores, or hand/foot pain.  No leg swelling.  She stopped furosemide approximately 1 week ago.  Ascites has not reaccumulated.  She is having bowel movements. She has noted soreness at the scalp vertex for the past few days. Objective:  Vital signs in last 24 hours:  Blood pressure 123/77, pulse 85, temperature 98.1 F (36.7 C), temperature source Temporal, resp. rate 18, height 5\' 4"  (1.626 m), weight 146 lb 4.8 oz (66.4 kg), SpO2 98%.    HEENT: No thrush or ulcers Resp: Lungs clear bilaterally Cardio: Rate and rhythm GI: No hepatosplenomegaly, no apparent ascites, mild tenderness in the right lower abdomen, no mass Vascular: No leg edema, left lower leg is slightly larger than the right side Skin: Hyperpigmentation at the abdominal pannus, mild alopecia, no palpable mass or apparent skin lesion at the parietal scalp.  Portacath/PICC-without erythema  Lab Results:  Lab Results  Component Value Date   WBC 4.4 12/27/2023   HGB 12.1 12/27/2023   HCT 37.2 12/27/2023   MCV 99.7 12/27/2023   PLT 319 12/27/2023   NEUTROABS 2.7 12/27/2023    CMP  Lab Results  Component Value Date   NA 140 12/01/2023   K 4.1 12/01/2023   CL 105 12/01/2023   CO2 26 12/01/2023   GLUCOSE 79 12/01/2023   BUN 25 (H) 12/01/2023   CREATININE 0.60 12/01/2023   CALCIUM 8.9 12/01/2023   PROT 6.7 12/01/2023   ALBUMIN 4.0 12/01/2023   AST 16 12/01/2023   ALT 13 12/01/2023   ALKPHOS 53 12/01/2023   BILITOT 0.4 12/01/2023   GFRNONAA >60 12/01/2023   GFRAA >60 03/13/2020    Lab Results  Component Value Date   CEA 0.8 07/13/2012   CA125 8 08/18/2016    Medications: I have reviewed the patient's current medications.   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 A 5 mm nodules remain on the right diaphragm. - TumorNext paired germline/tumor analyses: No somatic variants detected, germline CHEK2      VUS      Cycle 1 of adjuvant Taxol/carboplatin chemotherapy initiated on 09/19/2012.   The CA 125 normalized.   She completed day 15 of cycle 6 on 02/06/2013.   Restaging CT evaluation 03/29/2013 showed no evidence of metastatic disease in the chest. There was marked improvement in appearance/resolution of previous described peritoneal/omental disease. There was no convincing evidence of residual disease. There was minimal increased density in the region of the omentum favored to be treatment-related. There was no pelvic adenopathy. CA125 3.2 on 02/19/2014. 08/18/2016 CA-125 8 CT abdomen/pelvis 08/25/2016-solitary new enlarged right external iliac lymph node measuring 2.2 cm. Biopsy 09/07/2016-adenocarcinoma consistent with high-grade serous carcinoma.  ER +90%, PR -0%, HER2 negative (0); foundation 1-HRD not detected, microsatellite status cannot be determined, tumor mutation burden 8, FOLR1 positive-80%.   PET scan 09/22/2016-malignant range FDG uptake associated with the enlarged right external iliac lymph node compatible with metastatic adenopathy.  No additional sites of metastatic disease identified. Radiation 10/25/2016-12/01/2016 PET scan 03/28/2017-previously enlarged hypermetabolic right external iliac node-normal in size with resolution of hypermetabolic activity, no active malignancy identified PET scan 05/15/2018-no evidence of recurrent or metastatic disease, no hypermetabolic lymph nodes CT abdomen/pelvis 09/19/2019-mild increase in the size of several left  upper quadrant peritoneal nodules measuring up to 11 mm.  No other sites of metastatic disease identified.  No ascites. CT abdomen/pelvis 03/14/2020-slight enlargement of several  left upper quadrant peritoneal nodules, no ascites, no other evidence of disease progression CT abdomen/pelvis 09/12/2020 peritoneal nodularity/omental caking predominantly in the left upper/mid abdomen, mildly progressive.  Largest implant in the left upper abdomen adjacent to the stomach now measures 17 mm, previously 13 mm.  Overall volume of peritoneal disease has mildly progressed. CT abdomen/pelvis 01/13/2021- 4 mm right ureteral calculus with moderate right hydronephrosis and upper right hydroureter, progressive omental nodularity, stable calcified and partially calcified right pelvic nodules CT abdomen/pelvis 05/13/2021-enlargement of left upper quadrant omental nodules and a peritoneal nodule at the right iliac fossa, no ascites CT abdomen/pelvis 08/14/2021-mild increase in size of omental nodules, no ascites, no new site of metastatic disease Taxol/carboplatin 09/09/2021, 09/15/2021, 09/22/2021 Taxol/carboplatin 10/07/2021, 10/13/2021, 10/20/2021 Taxol/Carboplatin 11/03/2021, 11/10/2021, 11/17/2021 CT 11/23/2021-decrease in peritoneal metastases, no new or progressive disease, airspace opacity at both lung bases Taxol/carboplatin 12/01/2021, 12/08/2021, 12/15/2021 Taxol/Carboplatin every 2 weeks beginning 12/29/2021 Carboplatin alone 01/26/2022, Taxol held due to neuropathy Carboplatin alone 02/09/2022, Taxol held due to neuropathy Carboplatin alone 02/23/2022, Taxol held due to neuropathy Carboplatin alone 03/09/2022, Taxol held due to neuropathy CTs 03/25/2022-no significant change in omental, mesenteric disease.  No new or clearly progressive findings.  Previously noted patchy areas of consolidative opacity at the lung bases have resolved. Treatment break beginning 03/26/2022 CT/pelvis 08/10/2022-increased size of peritoneal nodules, increased small volume ascites, mild right lower lobe pleural nodularity Cycle 1 salvage carboplatin 09/14/2022 (weekly x 3 followed by 1 week break) Cycle 2 salvage carboplatin  10/12/2022 (weekly x 3 followed by 1 week break) Cycle 3 salvage carboplatin 11/16/2022 (weekly x 3 followed by 1 week break) CT abdomen/pelvis 12/08/2022-mild increase in peritoneal carcinomatosis Tamoxifen 20 mg daily 01/07/2023 CT abdomen/pelvis 03/29/2023-increased ascites with persistent diffuse peritoneal nodularity, largest omental implants have decreased in size.  No enlarging implants.,  No evidence of bowel obstruction Paracentesis for 4.5 L 06/29/2023 CTs 08/02/2023-peritoneal carcinomatosis similar to slightly improved.  Stable volume of ascites.  No evidence of metastatic disease in the chest. Tamoxifen 20 mg daily continued 08/02/2023 Paracentesis 08/25/2023-4 L of fluid removed, cytology shows adenocarcinoma Plan for Doxil Baseline 2D echo 09/23/2023-LVEF 55 to 60%.  Left ventricle has normal function.  Abnormal strain with apical sparing, follow-up TTE/MRI suggested in 3 months. Cycle 1 Doxil 10/06/2023 Cycle 2 Doxil 11/03/2023 CT abdomen/pelvis 11/16/2023: Compared to 08/02/2023 there is overall stable disease, new 0.9 cm nodule at the left descending colon, I reviewed the CT images.  Multiple lesions appear unchanged.  Stable ascites and peritoneal thickening Cycle 3 Doxil 12/01/2023 Echocardiogram 12/20/2023: LVEF 55-60% Cycle 4 Doxil 12/27/2023   2. Low abdomen/suprapubic pain prior to the exploratory laparotomy-likely secondary to omental/pelvic tumor; persistent mild pain in the lower abdomen   3. Chronic neck and back pain.   4. Anxiety -persistent despite Lexapro and Xanax. She has been evaluated by psychiatry. Currently on Lexapro. 5. Status post Port-A-Cath placement 09/22/2012. The Port-A-Cath was removed on 04/03/2013.   6. Neutropenia secondary to chemotherapy- day 15 cycle 1 and cycle 3. Taxol/carboplatin not given secondary to neutropenia.    7. Herpes zoster involving a right thoracic dermatome July 2015. She completed a course of Valtrex. 8. Nodular bony prominence at the  left pelvis on rectal exam 06/05/2015-likely a benign finding 9.  Right ureter stone/hydroureteronephrosis on CT 01/13/2021-referred to urology; status post right ureteroscopy with stone extraction  and ureteral stent placement.  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  Right hip fracture following a fall 11/02/2022-status post right hip anterior hemiarthroplasty 12.  Acute localized thrombus right proximal femoral vein 11/16/2022 Eliquis initiated 13.  Admission 06/24/2023 with volume overload      Disposition: [name] [name] appears stable.  She will complete another cycle of Doxil today.  There is no clinical evidence for progression of the ovarian cancer.  She will return for an office visit and Doxil in 4 weeks.  She will call for persistent discomfort at the parietal scalp.  I cannot appreciate a scalp lesion on exam today.  Thornton Papas, MD  12/27/2023  8:36 AM

## 2023-12-27 NOTE — Patient Instructions (Signed)
 [facility]   Discharge Instructions: Thank you for choosing [facility] to provide your oncology and hematology care.   If you have a lab appointment with the Cancer Center, please go directly to the Cancer Center and check in at the registration area.   Wear comfortable clothing and clothing appropriate for easy access to any Portacath or PICC line.   We strive to give you quality time with your provider. You may need to reschedule your appointment if you arrive late (15 or more minutes).  Arriving late affects you and other patients whose appointments are after yours.  Also, if you miss three or more appointments without notifying the office, you may be dismissed from the clinic at the provider's discretion.      For prescription refill requests, have your pharmacy contact our office and allow 72 hours for refills to be completed.    Today you received the following chemotherapy and/or immunotherapy agent: Doxorubicin HCL Liposomal (DOXIL).      To help prevent nausea and vomiting after your treatment, we encourage you to take your nausea medication as directed.  BELOW ARE SYMPTOMS THAT SHOULD BE REPORTED IMMEDIATELY: *FEVER GREATER THAN 100.4 F (38 C) OR HIGHER *CHILLS OR SWEATING *NAUSEA AND VOMITING THAT IS NOT CONTROLLED WITH YOUR NAUSEA MEDICATION *UNUSUAL SHORTNESS OF BREATH *UNUSUAL BRUISING OR BLEEDING *URINARY PROBLEMS (pain or burning when urinating, or frequent urination) *BOWEL PROBLEMS (unusual diarrhea, constipation, pain near the anus) TENDERNESS IN MOUTH AND THROAT WITH OR WITHOUT PRESENCE OF ULCERS (sore throat, sores in mouth, or a toothache) UNUSUAL RASH, SWELLING OR PAIN  UNUSUAL VAGINAL DISCHARGE OR ITCHING   Items with * indicate a potential emergency and should be followed up as soon as possible or go to the Emergency Department if any problems should occur.  Please show the CHEMOTHERAPY  ALERT CARD or IMMUNOTHERAPY ALERT CARD at check-in to the Emergency Department and triage nurse.  Should you have questions after your visit or need to cancel or reschedule your appointment, please contact [facility]  Dept: [phone]  and follow the prompts.  Office hours are 8:00 a.m. to 4:30 p.m. Monday - Friday. Please note that voicemails left after 4:00 p.m. may not be returned until the following business day.  We are closed weekends and major holidays. You have access to a nurse at all times for urgent questions. Please call the main number to the clinic Dept: [phone] and follow the prompts.   For any non-urgent questions, you may also contact your provider using MyChart. We now offer e-Visits for anyone 79 and older to request care online for non-urgent symptoms. For details visit [email or web address].   Also download the MyChart app! Go to the app store, search "MyChart", open the app, select [place], and log in with your MyChart username and password.

## 2023-12-27 NOTE — Progress Notes (Signed)
 Patient seen by Dr. Thornton Papas today  Vitals are within treatment parameters:Yes   Labs are within treatment parameters: Yes  ECHO good per MD  Treatment plan has been signed: Yes   Per physician team, Patient is ready for treatment and there are NO modifications to the treatment plan.

## 2023-12-28 LAB — CA 125: Cancer Antigen (CA) 125: 34 U/mL (ref 0.0–38.1)

## 2023-12-29 ENCOUNTER — Other Ambulatory Visit: Payer: PRIVATE HEALTH INSURANCE

## 2023-12-29 ENCOUNTER — Ambulatory Visit: Payer: PRIVATE HEALTH INSURANCE

## 2023-12-29 ENCOUNTER — Ambulatory Visit: Payer: PRIVATE HEALTH INSURANCE | Admitting: Oncology

## 2024-01-10 ENCOUNTER — Ambulatory Visit: Payer: PRIVATE HEALTH INSURANCE | Admitting: Nurse Practitioner

## 2024-01-17 ENCOUNTER — Ambulatory Visit (INDEPENDENT_AMBULATORY_CARE_PROVIDER_SITE_OTHER): Payer: PRIVATE HEALTH INSURANCE | Admitting: Nurse Practitioner

## 2024-01-17 VITALS — BP 110/62 | HR 67 | Temp 97.7°F | Ht 64.0 in | Wt 145.6 lb

## 2024-01-17 DIAGNOSIS — E785 Hyperlipidemia, unspecified: Secondary | ICD-10-CM

## 2024-01-17 DIAGNOSIS — C569 Malignant neoplasm of unspecified ovary: Secondary | ICD-10-CM

## 2024-01-17 DIAGNOSIS — R180 Malignant ascites: Secondary | ICD-10-CM | POA: Diagnosis not present

## 2024-01-17 LAB — LIPID PANEL
Cholesterol: 200 mg/dL (ref 0–200)
HDL: 46.4 mg/dL (ref >39.00)
LDL Cholesterol: 132 mg/dL — ABNORMAL HIGH (ref 0–99)
NonHDL: 153.59
Total CHOL/HDL Ratio: 4
Triglycerides: 107 mg/dL (ref 0.0–149.0)
VLDL: 21.4 mg/dL (ref 0.0–40.0)

## 2024-01-17 NOTE — Assessment & Plan Note (Addendum)
 Have been in remission since last paracentesis in January 2025.  Patient does have diuretics at home use on a as needed basis.  She has not had to use them in over a month

## 2024-01-17 NOTE — Patient Instructions (Signed)
 Nice to see you today  I have two fluid pills on your list Furosamide and torsemide. Let me know which you have at home. If you need to take one, choose one not both.   Follow up with me in 4 months for your physical and labs

## 2024-01-17 NOTE — Assessment & Plan Note (Signed)
 Patient currently followed by oncology and receiving any type of chemotherapy.  She is followed by Dr. Sonda Dural.  Continue follow with oncology as recommended continue taking medication as prescribed

## 2024-01-17 NOTE — Assessment & Plan Note (Signed)
 History of the same was on Crestor in the past.  Patient has stopped the medication she is currently doing fish oils and red yeast rice.  Pending lipid panel today

## 2024-01-17 NOTE — Progress Notes (Signed)
 Established Patient Office Visit  Subjective   Patient ID: [name] [name], female    DOB: [birth year]  Age: 73 y.o. MRN: [id number]  Chief Complaint  Patient presents with   Edema    Edema and lipids level recheck. Pt states she went off fluid pull and states that swelling has stabilized     HPI  Edema/ascites: states that she did undergo paraencentisi and lower exttremity edema. States that her last0/29/2024. You been off the fluid pills for a month. She has not had fluid accumination. She is followed by Thornton Papas for chemo and doing Doxil. He did do an echo and it was ok.   Lipid: hx of the same but she has stopped the crestor. She is on red yeast rice and fish oils. She has been taking it daily for several months     Review of Systems  Constitutional:  Negative for chills and fever.  Respiratory:  Negative for shortness of breath.   Cardiovascular:  Negative for chest pain and leg swelling.  Neurological:  Negative for headaches.  Psychiatric/Behavioral:  Negative for hallucinations and suicidal ideas.       Objective:     BP 110/62   Pulse 67   Temp 97.7 F (36.5 C) (Oral)   Ht 5\' 4"  (1.626 m)   Wt 145 lb 9.6 oz (66 kg)   SpO2 95%   BMI 24.99 kg/m  BP Readings from Last 3 Encounters:  01/17/24 110/62  12/27/23 123/77  12/01/23 117/81   Wt Readings from Last 3 Encounters:  01/17/24 145 lb 9.6 oz (66 kg)  12/27/23 146 lb 4.8 oz (66.4 kg)  12/01/23 143 lb 9.6 oz (65.1 kg)   SpO2 Readings from Last 3 Encounters:  01/17/24 95%  12/27/23 98%  12/01/23 97%      Physical Exam Vitals and nursing note reviewed.  Constitutional:      Appearance: Normal appearance.  Cardiovascular:     Rate and Rhythm: Normal rate and regular rhythm.     Heart sounds: Normal heart sounds.  Pulmonary:     Effort: Pulmonary effort is normal.     Breath sounds: Normal breath sounds.  Abdominal:     General: Bowel sounds are normal. There is no distension.      Tenderness: There is no abdominal tenderness.  Musculoskeletal:     Right lower leg: No edema.     Left lower leg: No edema.  Neurological:     Mental Status: She is alert.      No results found for any visits on 01/17/24.    The 10-year ASCVD risk score (Arnett DK, et al., 2019) is: 11.1%    Assessment & Plan:   Problem List Items Addressed This Visit       Endocrine   Ovarian cancer [facility])   Patient currently followed by oncology and receiving any type of chemotherapy.  She is followed by Dr. Alcide Evener.  Continue follow with oncology as recommended continue taking medication as prescribed        Other   Hyperlipidemia   History of the same was on Crestor in the past.  Patient has stopped the medication she is currently doing fish oils and red yeast rice.  Pending lipid panel today      Relevant Medications   torsemide (DEMADEX) 20 MG tablet   Other Relevant Orders   Lipid panel   Malignant ascites - Primary   Have been in remission since last paracentesis in January  2025.  Patient does have diuretics at home use on a as needed basis.  She has not had to use them in over a month       Return in about 4 months (around 05/18/2024) for CPE and Labs.    Margarie Shay, NP

## 2024-01-18 ENCOUNTER — Encounter: Payer: Self-pay | Admitting: Nurse Practitioner

## 2024-01-19 ENCOUNTER — Other Ambulatory Visit: Payer: Self-pay | Admitting: Oncology

## 2024-01-24 ENCOUNTER — Inpatient Hospital Stay: Payer: PRIVATE HEALTH INSURANCE | Admitting: Nurse Practitioner

## 2024-01-24 ENCOUNTER — Inpatient Hospital Stay: Payer: PRIVATE HEALTH INSURANCE

## 2024-01-24 ENCOUNTER — Encounter: Payer: Self-pay | Admitting: Nurse Practitioner

## 2024-01-24 ENCOUNTER — Inpatient Hospital Stay: Payer: PRIVATE HEALTH INSURANCE | Attending: Oncology

## 2024-01-24 VITALS — BP 123/77 | HR 70 | Temp 98.1°F | Resp 18 | Ht 64.0 in | Wt 144.2 lb

## 2024-01-24 DIAGNOSIS — I82411 Acute embolism and thrombosis of right femoral vein: Secondary | ICD-10-CM

## 2024-01-24 DIAGNOSIS — D701 Agranulocytosis secondary to cancer chemotherapy: Secondary | ICD-10-CM | POA: Diagnosis not present

## 2024-01-24 DIAGNOSIS — Z5111 Encounter for antineoplastic chemotherapy: Secondary | ICD-10-CM | POA: Insufficient documentation

## 2024-01-24 DIAGNOSIS — C569 Malignant neoplasm of unspecified ovary: Secondary | ICD-10-CM

## 2024-01-24 DIAGNOSIS — C786 Secondary malignant neoplasm of retroperitoneum and peritoneum: Secondary | ICD-10-CM | POA: Insufficient documentation

## 2024-01-24 LAB — CBC WITH DIFFERENTIAL (CANCER CENTER ONLY)
Abs Immature Granulocytes: 0.01 10*3/uL (ref 0.00–0.07)
Basophils Absolute: 0 10*3/uL (ref 0.0–0.1)
Basophils Relative: 1 %
Eosinophils Absolute: 0.1 10*3/uL (ref 0.0–0.5)
Eosinophils Relative: 2 %
HCT: 37.4 % (ref 36.0–46.0)
Hemoglobin: 12.2 g/dL (ref 12.0–15.0)
Immature Granulocytes: 0 %
Lymphocytes Relative: 19 %
Lymphs Abs: 0.9 10*3/uL (ref 0.7–4.0)
MCH: 32.3 pg (ref 26.0–34.0)
MCHC: 32.6 g/dL (ref 30.0–36.0)
MCV: 98.9 fL (ref 80.0–100.0)
Monocytes Absolute: 0.8 10*3/uL (ref 0.1–1.0)
Monocytes Relative: 18 %
Neutro Abs: 2.8 10*3/uL (ref 1.7–7.7)
Neutrophils Relative %: 60 %
Platelet Count: 312 10*3/uL (ref 150–400)
RBC: 3.78 MIL/uL — ABNORMAL LOW (ref 3.87–5.11)
RDW: 15.9 % — ABNORMAL HIGH (ref 11.5–15.5)
WBC Count: 4.7 10*3/uL (ref 4.0–10.5)
nRBC: 0 % (ref 0.0–0.2)

## 2024-01-24 LAB — CMP (CANCER CENTER ONLY)
ALT: 15 U/L (ref 0–44)
AST: 21 U/L (ref 15–41)
Albumin: 3.9 g/dL (ref 3.5–5.0)
Alkaline Phosphatase: 68 U/L (ref 38–126)
Anion gap: 11 (ref 5–15)
BUN: 18 mg/dL (ref 8–23)
CO2: 24 mmol/L (ref 22–32)
Calcium: 9.2 mg/dL (ref 8.9–10.3)
Chloride: 105 mmol/L (ref 98–111)
Creatinine: 0.65 mg/dL (ref 0.44–1.00)
GFR, Estimated: >60 mL/min (ref >60)
Glucose, Bld: 82 mg/dL (ref 70–99)
Potassium: 4 mmol/L (ref 3.5–5.1)
Sodium: 140 mmol/L (ref 135–145)
Total Bilirubin: 0.3 mg/dL (ref 0.0–1.2)
Total Protein: 6.3 g/dL — ABNORMAL LOW (ref 6.5–8.1)

## 2024-01-24 MED ORDER — DEXTROSE 5 % IV SOLN: INTRAVENOUS | Status: DC

## 2024-01-24 MED ORDER — DOXORUBICIN HCL LIPOSOMAL CHEMO INJECTION 2 MG/ML
39.0000 mg/m2 | Freq: Once | INTRAVENOUS | Status: AC
  Administered 2024-01-24: 70 mg via INTRAVENOUS
  Filled 2024-01-24: qty 25

## 2024-01-24 MED ORDER — SODIUM CHLORIDE 0.9% FLUSH
10.0000 mL | INTRAVENOUS | Status: DC | PRN
  Administered 2024-01-24: 10 mL

## 2024-01-24 MED ORDER — HEPARIN SOD (PORK) LOCK FLUSH 100 UNIT/ML IV SOLN
500.0000 [IU] | Freq: Once | INTRAVENOUS | Status: AC | PRN
  Administered 2024-01-24: 500 [IU]

## 2024-01-24 MED ORDER — APIXABAN 5 MG PO TABS: 5.0000 mg | ORAL_TABLET | Freq: Two times a day (BID) | ORAL | 1 refills | Status: DC

## 2024-01-24 MED ORDER — DEXAMETHASONE SODIUM PHOSPHATE 10 MG/ML IJ SOLN
10.0000 mg | Freq: Once | INTRAMUSCULAR | Status: AC
  Administered 2024-01-24: 10 mg via INTRAVENOUS
  Filled 2024-01-24: qty 1

## 2024-01-24 NOTE — Progress Notes (Signed)
 Patient seen by Lonna Cobb NP today  Vitals are within treatment parameters:Yes   Labs are within treatment parameters: Yes   Treatment plan has been signed: Yes   Per physician team, Patient is ready for treatment and there are NO modifications to the treatment plan.

## 2024-01-24 NOTE — Progress Notes (Signed)
 [facility] OFFICE PROGRESS NOTE   Diagnosis: Ovarian cancer  INTERVAL HISTORY:   [name] [name] returns as scheduled.  She completed cycle 4 Doxil  12/27/2023.  She denies nausea/vomiting.  No mouth sores.  No diarrhea.  Typical bowel habits include a formed stool after eating.  She has occasional abdominal pain relieved with a bowel movement.  She does not feel her abdomen is distended.  No rash.  She has occasional dyspnea on exertion.  No cough or fever.  No leg swelling.  Objective:  Vital signs in last 24 hours:  Blood pressure 123/77, pulse 70, temperature 98.1 F (36.7 C), temperature source Temporal, resp. rate 18, height 5\' 4"  (1.626 m), weight 144 lb 3.2 oz (65.4 kg), SpO2 98%.    HEENT: No thrush or ulcers. Resp: Lungs clear bilaterally. Cardio: Regular rate and rhythm. GI: No hepatosplenomegaly.  No apparent ascites.  Mild tenderness at the right lower abdomen (chronic), no mass. Vascular: No leg edema. Neuro: Alert and oriented. Skin: No rash. Port-A-Cath without erythema.  Lab Results:  Lab Results  Component Value Date   WBC 4.7 01/24/2024   HGB 12.2 01/24/2024   HCT 37.4 01/24/2024   MCV 98.9 01/24/2024   PLT 312 01/24/2024   NEUTROABS 2.8 01/24/2024    Imaging:  No results found.  Medications: I have reviewed the patient's current medications.  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 A 5 mm nodules remain on the right diaphragm. - TumorNext paired germline/tumor analyses: No somatic variants detected, germline CHEK2      VUS      Cycle 1 of adjuvant Taxol /carboplatin  chemotherapy initiated on 09/19/2012.   The CA 125 normalized.   She completed day 15 of cycle 6 on 02/06/2013.   Restaging CT evaluation 03/29/2013 showed no evidence of metastatic disease in the chest.  There was marked improvement in appearance/resolution of previous described peritoneal/omental disease. There was no convincing evidence of residual disease. There was minimal increased density in the region of the omentum favored to be treatment-related. There was no pelvic adenopathy. CA125 3.2 on 02/19/2014. 08/18/2016 CA-125 8 CT abdomen/pelvis 08/25/2016-solitary new enlarged right external iliac lymph node measuring 2.2 cm. Biopsy 09/07/2016-adenocarcinoma consistent with high-grade serous carcinoma.  ER +90%, PR -0%, HER2 negative (0); foundation 1-HRD not detected, microsatellite status cannot be determined, tumor mutation burden 8, FOLR1 positive-80%.   PET scan 09/22/2016-malignant range FDG uptake associated with the enlarged right external iliac lymph node compatible with metastatic adenopathy.  No additional sites of metastatic disease identified. Radiation 10/25/2016-12/01/2016 PET scan 03/28/2017-previously enlarged hypermetabolic right external iliac node-normal in size with resolution of hypermetabolic activity, no active malignancy identified PET scan 05/15/2018-no evidence of recurrent or metastatic disease, no hypermetabolic lymph nodes CT abdomen/pelvis 09/19/2019-mild increase in the size of several left upper quadrant peritoneal nodules measuring up to 11 mm.  No other sites of metastatic disease identified.  No ascites. CT abdomen/pelvis 03/14/2020-slight enlargement of several left upper quadrant peritoneal nodules, no ascites, no other evidence of disease progression CT abdomen/pelvis 09/12/2020 peritoneal nodularity/omental caking predominantly in the left upper/mid abdomen, mildly progressive.  Largest implant in the left upper abdomen adjacent to the stomach now measures 17 mm, previously 13 mm.  Overall volume of peritoneal disease has mildly progressed. CT abdomen/pelvis 01/13/2021- 4 mm right ureteral calculus with moderate right hydronephrosis and upper right hydroureter,  progressive omental nodularity, stable calcified and  partially calcified right pelvic nodules CT abdomen/pelvis 05/13/2021-enlargement of left upper quadrant omental nodules and a peritoneal nodule at the right iliac fossa, no ascites CT abdomen/pelvis 08/14/2021-mild increase in size of omental nodules, no ascites, no new site of metastatic disease Taxol /carboplatin  09/09/2021, 09/15/2021, 09/22/2021 Taxol /carboplatin  10/07/2021, 10/13/2021, 10/20/2021 Taxol /Carboplatin  11/03/2021, 11/10/2021, 11/17/2021 CT 11/23/2021-decrease in peritoneal metastases, no new or progressive disease, airspace opacity at both lung bases Taxol /carboplatin  12/01/2021, 12/08/2021, 12/15/2021 Taxol /Carboplatin  every 2 weeks beginning 12/29/2021 Carboplatin  alone 01/26/2022, Taxol  held due to neuropathy Carboplatin  alone 02/09/2022, Taxol  held due to neuropathy Carboplatin  alone 02/23/2022, Taxol  held due to neuropathy Carboplatin  alone 03/09/2022, Taxol  held due to neuropathy CTs 03/25/2022-no significant change in omental, mesenteric disease.  No new or clearly progressive findings.  Previously noted patchy areas of consolidative opacity at the lung bases have resolved. Treatment break beginning 03/26/2022 CT/pelvis 08/10/2022-increased size of peritoneal nodules, increased small volume ascites, mild right lower lobe pleural nodularity Cycle 1 salvage carboplatin  09/14/2022 (weekly x 3 followed by 1 week break) Cycle 2 salvage carboplatin  10/12/2022 (weekly x 3 followed by 1 week break) Cycle 3 salvage carboplatin  11/16/2022 (weekly x 3 followed by 1 week break) CT abdomen/pelvis 12/08/2022-mild increase in peritoneal carcinomatosis Tamoxifen  20 mg daily 01/07/2023 CT abdomen/pelvis 03/29/2023-increased ascites with persistent diffuse peritoneal nodularity, largest omental implants have decreased in size.  No enlarging implants.,  No evidence of bowel obstruction Paracentesis for 4.5 L 06/29/2023 CTs 08/02/2023-peritoneal carcinomatosis similar to  slightly improved.  Stable volume of ascites.  No evidence of metastatic disease in the chest. Tamoxifen  20 mg daily continued 08/02/2023 Paracentesis 08/25/2023-4 L of fluid removed, cytology shows adenocarcinoma Plan for Doxil  Baseline 2D echo 09/23/2023-LVEF 55 to 60%.  Left ventricle has normal function.  Abnormal strain with apical sparing, follow-up TTE/MRI suggested in 3 months. Cycle 1 Doxil  10/06/2023 Cycle 2 Doxil  11/03/2023 CT abdomen/pelvis 11/16/2023: Compared to 08/02/2023 there is overall stable disease, new 0.9 cm nodule at the left descending colon, I reviewed the CT images.  Multiple lesions appear unchanged.  Stable ascites and peritoneal thickening Cycle 3 Doxil  12/01/2023 Echocardiogram 12/20/2023: LVEF 55-60% Cycle 4 Doxil  12/27/2023 Cycle 5 Doxil  01/24/2024   2. Low abdomen/suprapubic pain prior to the exploratory laparotomy-likely secondary to omental/pelvic tumor; persistent mild pain in the lower abdomen   3. Chronic neck and back pain.   4. Anxiety -persistent despite Lexapro  and Xanax . She has been evaluated by psychiatry. Currently on Lexapro . 5. Status post Port-A-Cath placement 09/22/2012. The Port-A-Cath was removed on 04/03/2013.   6. Neutropenia secondary to chemotherapy- day 15 cycle 1 and cycle 3. Taxol /carboplatin  not given secondary to neutropenia.    7. Herpes zoster involving a right thoracic dermatome July 2015. She completed a course of Valtrex . 8. Nodular bony prominence at the left pelvis on rectal exam 06/05/2015-likely a benign finding 9.  Right ureter stone/hydroureteronephrosis on CT 01/13/2021-referred to urology; status post right ureteroscopy with stone extraction and ureteral stent placement.  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  Right hip fracture following a fall 11/02/2022-status post right hip anterior hemiarthroplasty 12.  Acute localized thrombus right proximal femoral vein 11/16/2022 Eliquis  initiated 13.  Admission 06/24/2023 with  volume overload    Disposition: Ms. Polack appears stable.  She has completed 4 cycles of Doxil .  She is tolerating treatment well.  There is no clinical evidence of disease progression.  Most recent CA125 tumor was further improved into normal range.  Plan to proceed with cycle 5 Doxil  today as scheduled.  CBC  and chemistry panel reviewed.  Labs adequate to proceed as above.  She will return for follow-up and treatment in 4 weeks.  We are available to see her sooner if needed.    [name] [name] ANP/GNP-BC   01/24/2024  8:44 AM

## 2024-01-24 NOTE — Patient Instructions (Signed)
 [facility]   Discharge Instructions: Thank you for choosing [facility] to provide your oncology and hematology care.   If you have a lab appointment with the Cancer Center, please go directly to the Cancer Center and check in at the registration area.   Wear comfortable clothing and clothing appropriate for easy access to any Portacath or PICC line.   We strive to give you quality time with your provider. You may need to reschedule your appointment if you arrive late (15 or more minutes).  Arriving late affects you and other patients whose appointments are after yours.  Also, if you miss three or more appointments without notifying the office, you may be dismissed from the clinic at the provider's discretion.      For prescription refill requests, have your pharmacy contact our office and allow 72 hours for refills to be completed.    Today you received the following chemotherapy and/or immunotherapy agent: Doxorubicin HCL Liposomal (DOXIL).      To help prevent nausea and vomiting after your treatment, we encourage you to take your nausea medication as directed.  BELOW ARE SYMPTOMS THAT SHOULD BE REPORTED IMMEDIATELY: *FEVER GREATER THAN 100.4 F (38 C) OR HIGHER *CHILLS OR SWEATING *NAUSEA AND VOMITING THAT IS NOT CONTROLLED WITH YOUR NAUSEA MEDICATION *UNUSUAL SHORTNESS OF BREATH *UNUSUAL BRUISING OR BLEEDING *URINARY PROBLEMS (pain or burning when urinating, or frequent urination) *BOWEL PROBLEMS (unusual diarrhea, constipation, pain near the anus) TENDERNESS IN MOUTH AND THROAT WITH OR WITHOUT PRESENCE OF ULCERS (sore throat, sores in mouth, or a toothache) UNUSUAL RASH, SWELLING OR PAIN  UNUSUAL VAGINAL DISCHARGE OR ITCHING   Items with * indicate a potential emergency and should be followed up as soon as possible or go to the Emergency Department if any problems should occur.  Please show the CHEMOTHERAPY  ALERT CARD or IMMUNOTHERAPY ALERT CARD at check-in to the Emergency Department and triage nurse.  Should you have questions after your visit or need to cancel or reschedule your appointment, please contact [facility]  Dept: [phone]  and follow the prompts.  Office hours are 8:00 a.m. to 4:30 p.m. Monday - Friday. Please note that voicemails left after 4:00 p.m. may not be returned until the following business day.  We are closed weekends and major holidays. You have access to a nurse at all times for urgent questions. Please call the main number to the clinic Dept: [phone] and follow the prompts.   For any non-urgent questions, you may also contact your provider using MyChart. We now offer e-Visits for anyone 79 and older to request care online for non-urgent symptoms. For details visit [email or web address].   Also download the MyChart app! Go to the app store, search "MyChart", open the app, select [place], and log in with your MyChart username and password.

## 2024-01-27 ENCOUNTER — Telehealth: Payer: Self-pay

## 2024-01-27 LAB — CA 125: Cancer Antigen (CA) 125: 28.9 U/mL (ref 0.0–38.1)

## 2024-01-27 NOTE — Telephone Encounter (Signed)
-----   Message from Diana Forster sent at 01/27/2024  8:21 AM EDT ----- Please let her know CA125 is lower in normal range.  Follow-up as scheduled.

## 2024-01-27 NOTE — Telephone Encounter (Signed)
 Patient gave verbal understanding and had no further questions at time.

## 2024-02-09 ENCOUNTER — Telehealth: Payer: Self-pay | Admitting: *Deleted

## 2024-02-09 NOTE — Telephone Encounter (Addendum)
 Reports not feeling well for several days and her stomach-area pain is progressive. Constant burning/stinging pain that she has reported in the past. Antacid and Pepto Bismol does not help the pain. Today she vomited and then had syncope episode. No blood in emesis. Had small BM today. Last good BM was 5/06. Still having some nausea, so just took a compazine . Has no appetite and is just sipping fluids. Denies any fever. Asking to be seen tomorrow. Per Dr. Scherrie Curt: Will be seen tomorrow at 0800. She agrees.

## 2024-02-10 ENCOUNTER — Inpatient Hospital Stay: Attending: Oncology | Admitting: Oncology

## 2024-02-10 ENCOUNTER — Telehealth: Payer: Self-pay | Admitting: *Deleted

## 2024-02-10 ENCOUNTER — Inpatient Hospital Stay: Attending: Oncology

## 2024-02-10 ENCOUNTER — Ambulatory Visit (HOSPITAL_COMMUNITY)
Admission: RE | Admit: 2024-02-10 | Discharge: 2024-02-10 | Disposition: A | Discharge status: Home / Self Care | Source: Ambulatory Visit | Attending: Oncology | Admitting: Oncology

## 2024-02-10 ENCOUNTER — Other Ambulatory Visit: Payer: Self-pay | Admitting: Oncology

## 2024-02-10 ENCOUNTER — Ambulatory Visit (HOSPITAL_BASED_OUTPATIENT_CLINIC_OR_DEPARTMENT_OTHER)
Admission: RE | Admit: 2024-02-10 | Discharge: 2024-02-10 | Disposition: A | Discharge status: Home / Self Care | Source: Ambulatory Visit | Attending: Oncology | Admitting: Oncology

## 2024-02-10 ENCOUNTER — Other Ambulatory Visit: Payer: Self-pay | Admitting: *Deleted

## 2024-02-10 VITALS — BP 125/84 | HR 93 | Temp 98.1°F | Resp 18 | Ht 64.0 in | Wt 146.7 lb

## 2024-02-10 DIAGNOSIS — C569 Malignant neoplasm of unspecified ovary: Secondary | ICD-10-CM

## 2024-02-10 DIAGNOSIS — R112 Nausea with vomiting, unspecified: Secondary | ICD-10-CM | POA: Diagnosis not present

## 2024-02-10 DIAGNOSIS — Z5112 Encounter for antineoplastic immunotherapy: Secondary | ICD-10-CM | POA: Diagnosis not present

## 2024-02-10 DIAGNOSIS — R140 Abdominal distension (gaseous): Secondary | ICD-10-CM | POA: Insufficient documentation

## 2024-02-10 DIAGNOSIS — R188 Other ascites: Secondary | ICD-10-CM | POA: Diagnosis not present

## 2024-02-10 DIAGNOSIS — Z8543 Personal history of malignant neoplasm of ovary: Secondary | ICD-10-CM | POA: Diagnosis present

## 2024-02-10 DIAGNOSIS — G893 Neoplasm related pain (acute) (chronic): Secondary | ICD-10-CM | POA: Insufficient documentation

## 2024-02-10 DIAGNOSIS — C786 Secondary malignant neoplasm of retroperitoneum and peritoneum: Secondary | ICD-10-CM | POA: Diagnosis not present

## 2024-02-10 DIAGNOSIS — K219 Gastro-esophageal reflux disease without esophagitis: Secondary | ICD-10-CM | POA: Insufficient documentation

## 2024-02-10 DIAGNOSIS — Z96641 Presence of right artificial hip joint: Secondary | ICD-10-CM | POA: Diagnosis not present

## 2024-02-10 DIAGNOSIS — R55 Syncope and collapse: Secondary | ICD-10-CM | POA: Diagnosis not present

## 2024-02-10 DIAGNOSIS — R180 Malignant ascites: Secondary | ICD-10-CM | POA: Insufficient documentation

## 2024-02-10 MED ORDER — LIDOCAINE HCL 1 % IJ SOLN
INTRAMUSCULAR | Status: AC
  Filled 2024-02-10: qty 20

## 2024-02-10 MED ORDER — ONDANSETRON HCL 4 MG/2ML IJ SOLN
4.0000 mg | Freq: Once | INTRAMUSCULAR | Status: AC
  Administered 2024-02-10: 4 mg via INTRAVENOUS
  Filled 2024-02-10: qty 2

## 2024-02-10 MED ORDER — SODIUM CHLORIDE 0.9 % IV SOLN: INTRAVENOUS | Status: AC

## 2024-02-10 NOTE — Progress Notes (Signed)
 Called [name] after appointment today to let her know the abdominal films were negative for obstruction. Proceed w/paracentesis as scheduled.

## 2024-02-10 NOTE — Patient Instructions (Signed)
 Rehydration, Older Adult  Rehydration is the replacement of fluids, salts, and minerals in the body (electrolytes) that are lost during dehydration. Dehydration is when there is not enough water or other fluids in the body. This happens when you lose more fluids than you take in. People who are age 74 or older have a higher risk of dehydration than younger adults. This is because in older age, the body: Is less able to maintain the right amount of water. Does not respond to temperature changes as well. Does not get a sense of thirst as easily or quickly. Other causes include: Not drinking enough fluids. This can occur when you are ill, when you forget to drink, or when you are doing activities that require a lot of energy, especially in hot weather. Conditions that cause loss of water or other fluids. These include diarrhea, vomiting, sweating, or urinating a lot. Other illnesses, such as fever or infection. Certain medicines, such as those that remove excess fluid from the body (diuretics). Symptoms of mild or moderate dehydration may include thirst, dry lips and mouth, and dizziness. Symptoms of severe dehydration may include increased heart rate, confusion, fainting, and not urinating. In severe cases, you may need to get fluids through an IV at the hospital. For mild or moderate cases, you can usually rehydrate at home by drinking certain fluids as told by your health care provider. What are the risks? Rehydration is usually safe. Taking in too much fluid (overhydration) can be a problem but is rare. Overhydration can cause an imbalance of electrolytes in the body, kidney failure, fluid in the lungs, or a decrease in salt (sodium) levels in the body. Supplies needed: You will need an oral rehydration solution (ORS) if your health care provider tells you to use one. This is a drink to treat dehydration. It can be found in pharmacies and retail stores. How to rehydrate Fluids Follow  instructions from your health care provider about what to drink. The kind of fluid and the amount you should drink depend on your condition. In general, you should choose drinks that you prefer. If told by your health care provider, drink an ORS. Make an ORS by following instructions on the package. Start by drinking small amounts, about  cup (120 mL) every 5-10 minutes. Slowly increase how much you drink until you have taken in the amount recommended by your health care provider. Drink enough clear fluids to keep your urine pale yellow. If you were told to drink an ORS, finish it first, then start slowly drinking other clear fluids. Drink fluids such as: Water. This includes sparkling and flavored water. Drinking only water can lead to having too little sodium in your body (hyponatremia). Follow the advice of your health care provider. Water from ice chips you suck on. Fruit juice with water added to it(diluted). Sports drinks. Hot or cold herbal teas. Broth-based soups. Coffee. Milk or milk products. Food Follow instructions from your health care provider about what to eat while you rehydrate. Your health care provider may recommend that you slowly begin eating regular foods in small amounts. Eat foods that contain a healthy balance of electrolytes, such as bananas, oranges, potatoes, tomatoes, and spinach. Avoid foods that are greasy or contain a lot of sugar. In some cases, you may get nutrition through a feeding tube that is passed through your nose and into your stomach (nasogastric tube, or NG tube). This may be done if you have uncontrolled vomiting or diarrhea. Drinks to avoid  Certain drinks may make dehydration worse. While you rehydrate, avoid drinking alcohol. How to tell if you are recovering from dehydration You may be getting better if: You are urinating more often than before you started rehydrating. Your urine is pale yellow. Your energy level improves. You vomit less  often. You have diarrhea less often. Your appetite improves or returns to normal. You feel less dizzy or light-headed. Your skin tone and color start to look more normal. Follow these instructions at home: Take over-the-counter and prescription medicines only as told by your health care provider. Do not take sodium tablets. Doing this can lead to having too much sodium in your body (hypernatremia). Contact a health care provider if: You continue to have symptoms of mild or moderate dehydration, such as: Thirst. Dry lips. Slightly dry mouth. Dizziness. Dark urine or less urine than usual. Muscle cramps. You continue to vomit or have diarrhea. Get help right away if: You have symptoms of dehydration that get worse. You have a fever. You have a severe headache. You have been vomiting and have problems, such as: Your vomiting gets worse. Your vomit includes blood or green matter (bile). You cannot eat or drink without vomiting. You have problems with urination or bowel movements, such as: Diarrhea that gets worse. Blood in your stool (feces). This may cause stool to look black and tarry. Not urinating, or urinating only a small amount of very dark urine, within 6-8 hours. You have trouble breathing. You have symptoms that get worse with treatment. These symptoms may be an emergency. Get help right away. Call 911. Do not wait to see if the symptoms will go away. Do not drive yourself to the hospital. This information is not intended to replace advice given to you by your health care provider. Make sure you discuss any questions you have with your health care provider. Document Revised: 02/03/2022 Document Reviewed: 02/01/2022 Elsevier Patient Education  2024 Elsevier Inc.   Ondansetron Injection What is this medication? ONDANSETRON (on DAN se tron) prevents nausea and vomiting from chemotherapy, radiation, or surgery. It works by blocking substances in the body that may cause  nausea or vomiting. It belongs to a class of medications called antiemetics. This medicine may be used for other purposes; ask your health care provider or pharmacist if you have questions. COMMON BRAND NAME(S): Zofran, Zofran in Dextrose, Zofran Solution What should I tell my care team before I take this medication? They need to know if you have any of these conditions: Heart disease History of irregular heartbeat Liver disease Low levels of magnesium or potassium in the blood An unusual or allergic reaction to ondansetron, granisetron, other medications, foods, dyes, or preservatives Pregnant or trying to get pregnant Breast-feeding How should I use this medication? This medication is injected into a vein. It is given by your care team in a hospital or clinic setting. Talk to your care team about the use of this medication in children. Special care may be needed. Overdosage: If you think you have taken too much of this medicine contact a poison control center or emergency room at once. NOTE: This medicine is only for you. Do not share this medicine with others. What if I miss a dose? This does not apply. What may interact with this medication? Do not take this medication with any of the following: Apomorphine Certain medications for fungal infections, such as fluconazole, itraconazole, ketoconazole, posaconazole, voriconazole Cisapride Dronedarone Pimozide Thioridazine This medication may also interact with the following: Carbamazepine  Certain medications for depression, anxiety, or mental health conditions Fentanyl Linezolid MAOIs, such as Carbex, Eldepryl, Marplan, Nardil, and Parnate Methylene blue (injected into a vein) Other medications that cause heart rhythm changes, such as dofetilide, ziprasidone Phenytoin Rifampicin Tramadol This list may not describe all possible interactions. Give your health care provider a list of all the medicines, herbs, non-prescription drugs,  or dietary supplements you use. Also tell them if you smoke, drink alcohol, or use illegal drugs. Some items may interact with your medicine. What should I watch for while using this medication? Your condition will be monitored carefully while you are receiving this medication. What side effects may I notice from receiving this medication? Side effects that you should report to your care team as soon as possible: Allergic reactions--skin rash, itching, hives, swelling of the face, lips, tongue, or throat Bowel blockage--stomach cramping, unable to have a bowel movement or pass gas, loss of appetite, vomiting Chest pain (angina)--pain, pressure, or tightness in the chest, neck, back, or arms Heart rhythm changes--fast or irregular heartbeat, dizziness, feeling faint or lightheaded, chest pain, trouble breathing Irritability, confusion, fast or irregular heartbeat, muscle stiffness, twitching muscles, sweating, high fever, seizure, chills, vomiting, diarrhea, which may be signs of serotonin syndrome Side effects that usually do not require medical attention (report to your care team if they continue or are bothersome): Constipation Diarrhea General discomfort and fatigue Headache This list may not describe all possible side effects. Call your doctor for medical advice about side effects. You may report side effects to FDA at 1-800-FDA-1088. Where should I keep my medication? This medication is given in a hospital or clinic and will not be stored at home. NOTE: This sheet is a summary. It may not cover all possible information. If you have questions about this medicine, talk to your doctor, pharmacist, or health care provider.  2024 Elsevier/Gold Standard (2022-02-18 00:00:00)

## 2024-02-10 NOTE — Progress Notes (Signed)
 [facility] OFFICE PROGRESS NOTE   Diagnosis: Ovarian cancer  INTERVAL HISTORY:   Ms. Calender presents for an unscheduled visit.  She reports abdominal pain, distention, and nausea for the past several days.  She has been able to eat and drink very little.  She had vomiting yesterday.  She had a syncope event yesterday.  She is having bowel movements.  She has noted increased belching for the past few weeks.  Objective:  Vital signs in last 24 hours:  Blood pressure 125/84, pulse 93, temperature 98.1 F (36.7 C), temperature source Temporal, resp. rate 18, height 5\' 4"  (1.626 m), weight 146 lb 11.2 oz (66.5 kg), SpO2 98%.    HEENT: No thrush or ulcers Resp: Lungs with decreased breath sounds and end inspiratory rhonchi at the posterior base bilaterally, no respiratory distress Cardio: Regular rate and rhythm GI: Distended, firm masslike fullness in the left mid abdomen Vascular: No leg edema  Skin: Diminished skin turgor  Portacath/PICC-without erythema  Lab Results:  Lab Results  Component Value Date   WBC 4.7 01/24/2024   HGB 12.2 01/24/2024   HCT 37.4 01/24/2024   MCV 98.9 01/24/2024   PLT 312 01/24/2024   NEUTROABS 2.8 01/24/2024    CMP  Lab Results  Component Value Date   NA 140 01/24/2024   K 4.0 01/24/2024   CL 105 01/24/2024   CO2 24 01/24/2024   GLUCOSE 82 01/24/2024   BUN 18 01/24/2024   CREATININE 0.65 01/24/2024   CALCIUM  9.2 01/24/2024   PROT 6.3 (L) 01/24/2024   ALBUMIN 3.9 01/24/2024   AST 21 01/24/2024   ALT 15 01/24/2024   ALKPHOS 68 01/24/2024   BILITOT 0.3 01/24/2024   GFRNONAA >60 01/24/2024   GFRAA >60 03/13/2020    Lab Results  Component Value Date   CEA 0.8 07/13/2012   CA125 8 08/18/2016    Lab Results  Component Value Date   INR 1.0 11/02/2022   LABPROT 12.9 11/02/2022    Imaging:  No results found.  Medications: I have reviewed the patient's current medications.   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 A 5 mm nodules remain on the right diaphragm. - TumorNext paired germline/tumor analyses: No somatic variants detected, germline CHEK2      VUS      Cycle 1 of adjuvant Taxol /carboplatin  chemotherapy initiated on 09/19/2012.   The CA 125 normalized.   She completed day 15 of cycle 6 on 02/06/2013.   Restaging CT evaluation 03/29/2013 showed no evidence of metastatic disease in the chest. There was marked improvement in appearance/resolution of previous described peritoneal/omental disease. There was no convincing evidence of residual disease. There was minimal increased density in the region of the omentum favored to be treatment-related. There was no pelvic adenopathy. CA125 3.2 on 02/19/2014. 08/18/2016 CA-125 8 CT abdomen/pelvis 08/25/2016-solitary new enlarged right external iliac lymph node measuring 2.2 cm. Biopsy 09/07/2016-adenocarcinoma consistent with high-grade serous carcinoma.  ER +90%, PR -0%, HER2 negative (0); foundation 1-HRD not detected, microsatellite status cannot be determined, tumor mutation burden 8, FOLR1 positive-80%.   PET scan 09/22/2016-malignant range FDG uptake associated with the enlarged right external iliac lymph node compatible with metastatic adenopathy.  No additional sites of metastatic disease identified. Radiation 10/25/2016-12/01/2016 PET scan 03/28/2017-previously enlarged hypermetabolic right external iliac node-normal in size with resolution of hypermetabolic activity, no active malignancy identified PET scan 05/15/2018-no evidence  of recurrent or metastatic disease, no hypermetabolic lymph nodes CT abdomen/pelvis 09/19/2019-mild increase in the size of several left upper quadrant peritoneal nodules measuring up to 11 mm.  No other sites of metastatic disease identified.  No ascites. CT  abdomen/pelvis 03/14/2020-slight enlargement of several left upper quadrant peritoneal nodules, no ascites, no other evidence of disease progression CT abdomen/pelvis 09/12/2020 peritoneal nodularity/omental caking predominantly in the left upper/mid abdomen, mildly progressive.  Largest implant in the left upper abdomen adjacent to the stomach now measures 17 mm, previously 13 mm.  Overall volume of peritoneal disease has mildly progressed. CT abdomen/pelvis 01/13/2021- 4 mm right ureteral calculus with moderate right hydronephrosis and upper right hydroureter, progressive omental nodularity, stable calcified and partially calcified right pelvic nodules CT abdomen/pelvis 05/13/2021-enlargement of left upper quadrant omental nodules and a peritoneal nodule at the right iliac fossa, no ascites CT abdomen/pelvis 08/14/2021-mild increase in size of omental nodules, no ascites, no new site of metastatic disease Taxol /carboplatin  09/09/2021, 09/15/2021, 09/22/2021 Taxol /carboplatin  10/07/2021, 10/13/2021, 10/20/2021 Taxol /Carboplatin  11/03/2021, 11/10/2021, 11/17/2021 CT 11/23/2021-decrease in peritoneal metastases, no new or progressive disease, airspace opacity at both lung bases Taxol /carboplatin  12/01/2021, 12/08/2021, 12/15/2021 Taxol /Carboplatin  every 2 weeks beginning 12/29/2021 Carboplatin  alone 01/26/2022, Taxol  held due to neuropathy Carboplatin  alone 02/09/2022, Taxol  held due to neuropathy Carboplatin  alone 02/23/2022, Taxol  held due to neuropathy Carboplatin  alone 03/09/2022, Taxol  held due to neuropathy CTs 03/25/2022-no significant change in omental, mesenteric disease.  No new or clearly progressive findings.  Previously noted patchy areas of consolidative opacity at the lung bases have resolved. Treatment break beginning 03/26/2022 CT/pelvis 08/10/2022-increased size of peritoneal nodules, increased small volume ascites, mild right lower lobe pleural nodularity Cycle 1 salvage carboplatin  09/14/2022 (weekly x 3  followed by 1 week break) Cycle 2 salvage carboplatin  10/12/2022 (weekly x 3 followed by 1 week break) Cycle 3 salvage carboplatin  11/16/2022 (weekly x 3 followed by 1 week break) CT abdomen/pelvis 12/08/2022-mild increase in peritoneal carcinomatosis Tamoxifen  20 mg daily 01/07/2023 CT abdomen/pelvis 03/29/2023-increased ascites with persistent diffuse peritoneal nodularity, largest omental implants have decreased in size.  No enlarging implants.,  No evidence of bowel obstruction Paracentesis for 4.5 L 06/29/2023 CTs 08/02/2023-peritoneal carcinomatosis similar to slightly improved.  Stable volume of ascites.  No evidence of metastatic disease in the chest. Tamoxifen  20 mg daily continued 08/02/2023 Paracentesis 08/25/2023-4 L of fluid removed, cytology shows adenocarcinoma Plan for Doxil  Baseline 2D echo 09/23/2023-LVEF 55 to 60%.  Left ventricle has normal function.  Abnormal strain with apical sparing, follow-up TTE/MRI suggested in 3 months. Cycle 1 Doxil  10/06/2023 Cycle 2 Doxil  11/03/2023 CT abdomen/pelvis 11/16/2023: Compared to 08/02/2023 there is overall stable disease, new 0.9 cm nodule at the left descending colon, I reviewed the CT images.  Multiple lesions appear unchanged.  Stable ascites and peritoneal thickening Cycle 3 Doxil  12/01/2023 Echocardiogram 12/20/2023: LVEF 55-60% Cycle 4 Doxil  12/27/2023 Cycle 5 Doxil  01/24/2024   2. Low abdomen/suprapubic pain prior to the exploratory laparotomy-likely secondary to omental/pelvic tumor; persistent mild pain in the lower abdomen   3. Chronic neck and back pain.   4. Anxiety -persistent despite Lexapro  and Xanax . She has been evaluated by psychiatry. Currently on Lexapro . 5. Status post Port-A-Cath placement 09/22/2012. The Port-A-Cath was removed on 04/03/2013.   6. Neutropenia secondary to chemotherapy- day 15 cycle 1 and cycle 3. Taxol /carboplatin  not given secondary to neutropenia.    7. Herpes zoster involving a right thoracic dermatome July  2015. She completed a course of Valtrex . 8. Nodular bony prominence at the left pelvis on  rectal exam 06/05/2015-likely a benign finding 9.  Right ureter stone/hydroureteronephrosis on CT 01/13/2021-referred to urology; status post right ureteroscopy with stone extraction and ureteral stent placement.  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  Right hip fracture following a fall 11/02/2022-status post right hip anterior hemiarthroplasty 12.  Acute localized thrombus right proximal femoral vein 11/16/2022 Eliquis  initiated 13.  Admission 06/24/2023 with volume overload      Disposition: Ms. Stick has recurrent ovarian cancer.  She has completed 5 treatments with Doxil .  The CA125 was lower on 01/24/2024.  She presents today with abdominal distention, nausea, abdominal pain, and vomiting.  She reports having bowel movements. Her symptoms may be related to progressive ascites/carcinomatosis or obstruction.  She could have symptoms related to adhesions.  We will obtain a plain x-ray and refer her for a therapeutic paracentesis.  She will begin a liquid diet and use MiraLAX  daily.  She will call if she has unable to tolerate liquids.  She will receive intravenous fluids in the chemotherapy room today.  She will try liquids while here today.  Ms. Kelch will follow-up as scheduled 02/21/2024.  We will schedule a restaging CT if her symptoms do not improve over the next few days.  Coni Deep, MD  02/10/2024  8:11 AM

## 2024-02-10 NOTE — Progress Notes (Signed)
 Patient ID: [name] [name], female   DOB: Jan 12, 1951, 73 y.o.   MRN: [id number] Patient presented to [facility] ultrasound dept today for therapeutic paracentesis.  On limited ultrasound abdomen in all 4 quadrants there is only a small amount of ascites noted in right upper quadrant, not enough to warrant therapeutic paracentesis at this time.  Procedure canceled.  Patient updated.

## 2024-02-10 NOTE — Telephone Encounter (Signed)
 [name] [name] reports that the paracentesis was not done-one side had no fluid and the other did not have enough to tap. Per Dr. Scherrie Curt: continue liquid diet and take nausea meds. If she starts vomiting or pain worsens, need to go to ER. Call back on Monday with an update. May just need to get another CT scan to see if there is something the abd xray did not see. She understands and agrees. She adds that she has also been having lower back pain (forgot to tell him this today).

## 2024-02-13 ENCOUNTER — Other Ambulatory Visit: Payer: Self-pay

## 2024-02-13 ENCOUNTER — Encounter: Payer: Self-pay | Admitting: Nurse Practitioner

## 2024-02-13 ENCOUNTER — Inpatient Hospital Stay

## 2024-02-13 ENCOUNTER — Telehealth: Payer: Self-pay | Admitting: *Deleted

## 2024-02-13 ENCOUNTER — Inpatient Hospital Stay: Admitting: Nurse Practitioner

## 2024-02-13 VITALS — BP 119/82 | HR 98 | Resp 18 | Ht 64.0 in | Wt 147.0 lb

## 2024-02-13 DIAGNOSIS — C569 Malignant neoplasm of unspecified ovary: Secondary | ICD-10-CM

## 2024-02-13 DIAGNOSIS — R140 Abdominal distension (gaseous): Secondary | ICD-10-CM | POA: Diagnosis not present

## 2024-02-13 DIAGNOSIS — R55 Syncope and collapse: Secondary | ICD-10-CM | POA: Diagnosis not present

## 2024-02-13 DIAGNOSIS — Z5112 Encounter for antineoplastic immunotherapy: Secondary | ICD-10-CM | POA: Diagnosis not present

## 2024-02-13 DIAGNOSIS — Z8543 Personal history of malignant neoplasm of ovary: Secondary | ICD-10-CM | POA: Diagnosis not present

## 2024-02-13 DIAGNOSIS — R112 Nausea with vomiting, unspecified: Secondary | ICD-10-CM | POA: Diagnosis not present

## 2024-02-13 DIAGNOSIS — R180 Malignant ascites: Secondary | ICD-10-CM | POA: Diagnosis not present

## 2024-02-13 DIAGNOSIS — K219 Gastro-esophageal reflux disease without esophagitis: Secondary | ICD-10-CM | POA: Diagnosis not present

## 2024-02-13 DIAGNOSIS — C786 Secondary malignant neoplasm of retroperitoneum and peritoneum: Secondary | ICD-10-CM | POA: Diagnosis not present

## 2024-02-13 DIAGNOSIS — G893 Neoplasm related pain (acute) (chronic): Secondary | ICD-10-CM | POA: Diagnosis not present

## 2024-02-13 LAB — CBC WITH DIFFERENTIAL (CANCER CENTER ONLY)
Abs Immature Granulocytes: 0.02 10*3/uL (ref 0.00–0.07)
Basophils Absolute: 0 10*3/uL (ref 0.0–0.1)
Basophils Relative: 1 %
Eosinophils Absolute: 0.1 10*3/uL (ref 0.0–0.5)
Eosinophils Relative: 1 %
HCT: 37.7 % (ref 36.0–46.0)
Hemoglobin: 12.1 g/dL (ref 12.0–15.0)
Immature Granulocytes: 0 %
Lymphocytes Relative: 15 %
Lymphs Abs: 0.7 10*3/uL (ref 0.7–4.0)
MCH: 31.9 pg (ref 26.0–34.0)
MCHC: 32.1 g/dL (ref 30.0–36.0)
MCV: 99.5 fL (ref 80.0–100.0)
Monocytes Absolute: 0.7 10*3/uL (ref 0.1–1.0)
Monocytes Relative: 15 %
Neutro Abs: 3.2 10*3/uL (ref 1.7–7.7)
Neutrophils Relative %: 68 %
Platelet Count: 341 10*3/uL (ref 150–400)
RBC: 3.79 MIL/uL — ABNORMAL LOW (ref 3.87–5.11)
RDW: 15.3 % (ref 11.5–15.5)
WBC Count: 4.7 10*3/uL (ref 4.0–10.5)
nRBC: 0 % (ref 0.0–0.2)

## 2024-02-13 LAB — CMP (CANCER CENTER ONLY)
ALT: 37 U/L (ref 0–44)
AST: 32 U/L (ref 15–41)
Albumin: 3.6 g/dL (ref 3.5–5.0)
Alkaline Phosphatase: 75 U/L (ref 38–126)
Anion gap: 12 (ref 5–15)
BUN: 18 mg/dL (ref 8–23)
CO2: 27 mmol/L (ref 22–32)
Calcium: 9.2 mg/dL (ref 8.9–10.3)
Chloride: 100 mmol/L (ref 98–111)
Creatinine: 0.65 mg/dL (ref 0.44–1.00)
GFR, Estimated: >60 mL/min (ref >60)
Glucose, Bld: 90 mg/dL (ref 70–99)
Potassium: 3.8 mmol/L (ref 3.5–5.1)
Sodium: 138 mmol/L (ref 135–145)
Total Bilirubin: 0.4 mg/dL (ref 0.0–1.2)
Total Protein: 6.4 g/dL — ABNORMAL LOW (ref 6.5–8.1)

## 2024-02-13 MED ORDER — HEPARIN SOD (PORK) LOCK FLUSH 100 UNIT/ML IV SOLN
500.0000 [IU] | Freq: Once | INTRAVENOUS | Status: AC
  Administered 2024-02-13: 500 [IU] via INTRAVENOUS

## 2024-02-13 MED ORDER — DEXAMETHASONE SODIUM PHOSPHATE 10 MG/ML IJ SOLN
10.0000 mg | Freq: Once | INTRAMUSCULAR | Status: AC
  Administered 2024-02-13: 10 mg via INTRAVENOUS
  Filled 2024-02-13: qty 1

## 2024-02-13 MED ORDER — SODIUM CHLORIDE 0.9 % IV SOLN
INTRAVENOUS | Status: AC
Start: 2024-02-13 — End: 2024-02-13

## 2024-02-13 MED ORDER — SODIUM CHLORIDE 0.9% FLUSH
10.0000 mL | INTRAVENOUS | Status: DC | PRN
  Administered 2024-02-13: 10 mL via INTRAVENOUS

## 2024-02-13 MED ORDER — LORAZEPAM 0.5 MG PO TABS: 0.5000 mg | ORAL_TABLET | Freq: Three times a day (TID) | ORAL | 0 refills | Status: DC | PRN

## 2024-02-13 MED ORDER — ONDANSETRON HCL 4 MG/2ML IJ SOLN
8.0000 mg | Freq: Once | INTRAMUSCULAR | Status: AC
  Administered 2024-02-13: 8 mg via INTRAVENOUS
  Filled 2024-02-13: qty 4

## 2024-02-13 NOTE — Progress Notes (Unsigned)
 [facility] OFFICE PROGRESS NOTE   Diagnosis: Ovarian cancer  INTERVAL HISTORY:   [name] returns prior to scheduled follow-up.  She was seen 02/10/2024 with abdominal pain, distention and nausea.  Abdominal x-ray was negative for evidence of obstruction.  Ultrasound showed a small amount of ascites in the right abdomen, paracentesis not performed.  She was given IV fluids.  She contacted the office today to report persistent symptoms.  She continues to have nausea and abdominal pain.  She belches after any oral intake including water .  No vomiting.  Small loose stool yesterday.  Last normal bowel movement was about a week ago.  She feels dizzy.  Objective:  Vital signs in last 24 hours:  Blood pressure 119/82, pulse 98, resp. rate 18, height 5\' 4"  (1.626 m), weight 147 lb (66.7 kg), SpO2 99%.    HEENT: No thrush.  Mucous membranes appear moist. Resp: Faint inspiratory rhonchi lung bases bilaterally.  No respiratory distress. Cardio: Regular rate and rhythm. GI: Abdomen is distended, firm.  Bowel sounds present.  No apparent ascites. Vascular: No leg edema. Skin: Skin turgor is decreased. Port-A-Cath without erythema.  Lab Results:  Lab Results  Component Value Date   WBC 4.7 01/24/2024   HGB 12.2 01/24/2024   HCT 37.4 01/24/2024   MCV 98.9 01/24/2024   PLT 312 01/24/2024   NEUTROABS 2.8 01/24/2024    Imaging:  No results found.  Medications: I have reviewed the patient's current medications.  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 A 5 mm nodules remain on the right diaphragm. - TumorNext paired germline/tumor analyses: No somatic variants detected, germline CHEK2      VUS      Cycle 1 of adjuvant Taxol /carboplatin  chemotherapy initiated on 09/19/2012.   The CA 125 normalized.    She completed day 15 of cycle 6 on 02/06/2013.   Restaging CT evaluation 03/29/2013 showed no evidence of metastatic disease in the chest. There was marked improvement in appearance/resolution of previous described peritoneal/omental disease. There was no convincing evidence of residual disease. There was minimal increased density in the region of the omentum favored to be treatment-related. There was no pelvic adenopathy. CA125 3.2 on 02/19/2014. 08/18/2016 CA-125 8 CT abdomen/pelvis 08/25/2016-solitary new enlarged right external iliac lymph node measuring 2.2 cm. Biopsy 09/07/2016-adenocarcinoma consistent with high-grade serous carcinoma.  ER +90%, PR -0%, HER2 negative (0); foundation 1-HRD not detected, microsatellite status cannot be determined, tumor mutation burden 8, FOLR1 positive-80%.   PET scan 09/22/2016-malignant range FDG uptake associated with the enlarged right external iliac lymph node compatible with metastatic adenopathy.  No additional sites of metastatic disease identified. Radiation 10/25/2016-12/01/2016 PET scan 03/28/2017-previously enlarged hypermetabolic right external iliac node-normal in size with resolution of hypermetabolic activity, no active malignancy identified PET scan 05/15/2018-no evidence of recurrent or metastatic disease, no hypermetabolic lymph nodes CT abdomen/pelvis 09/19/2019-mild increase in the size of several left upper quadrant peritoneal nodules measuring up to 11 mm.  No other sites of metastatic disease identified.  No ascites. CT abdomen/pelvis 03/14/2020-slight enlargement of several left upper quadrant peritoneal nodules, no ascites, no other evidence of disease progression CT abdomen/pelvis 09/12/2020 peritoneal nodularity/omental caking predominantly in the left upper/mid abdomen, mildly progressive.  Largest implant in the left upper abdomen adjacent to the stomach now measures 17 mm, previously 13 mm.  Overall volume of peritoneal disease has  mildly progressed. CT abdomen/pelvis 01/13/2021-  4 mm right ureteral calculus with moderate right hydronephrosis and upper right hydroureter, progressive omental nodularity, stable calcified and partially calcified right pelvic nodules CT abdomen/pelvis 05/13/2021-enlargement of left upper quadrant omental nodules and a peritoneal nodule at the right iliac fossa, no ascites CT abdomen/pelvis 08/14/2021-mild increase in size of omental nodules, no ascites, no new site of metastatic disease Taxol /carboplatin  09/09/2021, 09/15/2021, 09/22/2021 Taxol /carboplatin  10/07/2021, 10/13/2021, 10/20/2021 Taxol /Carboplatin  11/03/2021, 11/10/2021, 11/17/2021 CT 11/23/2021-decrease in peritoneal metastases, no new or progressive disease, airspace opacity at both lung bases Taxol /carboplatin  12/01/2021, 12/08/2021, 12/15/2021 Taxol /Carboplatin  every 2 weeks beginning 12/29/2021 Carboplatin  alone 01/26/2022, Taxol  held due to neuropathy Carboplatin  alone 02/09/2022, Taxol  held due to neuropathy Carboplatin  alone 02/23/2022, Taxol  held due to neuropathy Carboplatin  alone 03/09/2022, Taxol  held due to neuropathy CTs 03/25/2022-no significant change in omental, mesenteric disease.  No new or clearly progressive findings.  Previously noted patchy areas of consolidative opacity at the lung bases have resolved. Treatment break beginning 03/26/2022 CT/pelvis 08/10/2022-increased size of peritoneal nodules, increased small volume ascites, mild right lower lobe pleural nodularity Cycle 1 salvage carboplatin  09/14/2022 (weekly x 3 followed by 1 week break) Cycle 2 salvage carboplatin  10/12/2022 (weekly x 3 followed by 1 week break) Cycle 3 salvage carboplatin  11/16/2022 (weekly x 3 followed by 1 week break) CT abdomen/pelvis 12/08/2022-mild increase in peritoneal carcinomatosis Tamoxifen  20 mg daily 01/07/2023 CT abdomen/pelvis 03/29/2023-increased ascites with persistent diffuse peritoneal nodularity, largest omental implants have decreased in size.  No  enlarging implants.,  No evidence of bowel obstruction Paracentesis for 4.5 L 06/29/2023 CTs 08/02/2023-peritoneal carcinomatosis similar to slightly improved.  Stable volume of ascites.  No evidence of metastatic disease in the chest. Tamoxifen  20 mg daily continued 08/02/2023 Paracentesis 08/25/2023-4 L of fluid removed, cytology shows adenocarcinoma Plan for Doxil  Baseline 2D echo 09/23/2023-LVEF 55 to 60%.  Left ventricle has normal function.  Abnormal strain with apical sparing, follow-up TTE/MRI suggested in 3 months. Cycle 1 Doxil  10/06/2023 Cycle 2 Doxil  11/03/2023 CT abdomen/pelvis 11/16/2023: Compared to 08/02/2023 there is overall stable disease, new 0.9 cm nodule at the left descending colon, I reviewed the CT images.  Multiple lesions appear unchanged.  Stable ascites and peritoneal thickening Cycle 3 Doxil  12/01/2023 Echocardiogram 12/20/2023: LVEF 55-60% Cycle 4 Doxil  12/27/2023 Cycle 5 Doxil  01/24/2024   2. Low abdomen/suprapubic pain prior to the exploratory laparotomy-likely secondary to omental/pelvic tumor; persistent mild pain in the lower abdomen   3. Chronic neck and back pain.   4. Anxiety -persistent despite Lexapro  and Xanax . She has been evaluated by psychiatry. Currently on Lexapro . 5. Status post Port-A-Cath placement 09/22/2012. The Port-A-Cath was removed on 04/03/2013.   6. Neutropenia secondary to chemotherapy- day 15 cycle 1 and cycle 3. Taxol /carboplatin  not given secondary to neutropenia.    7. Herpes zoster involving a right thoracic dermatome July 2015. She completed a course of Valtrex . 8. Nodular bony prominence at the left pelvis on rectal exam 06/05/2015-likely a benign finding 9.  Right ureter stone/hydroureteronephrosis on CT 01/13/2021-referred to urology; status post right ureteroscopy with stone extraction and ureteral stent placement.  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  Right hip fracture following a fall 11/02/2022-status post right hip  anterior hemiarthroplasty 12.  Acute localized thrombus right proximal femoral vein 11/16/2022 Eliquis  initiated 13.  Admission 06/24/2023 with volume overload  Disposition: Ms. Freitas has completed 5 cycles of Doxil .  She is seen today with continued abdominal pain and nausea.  She understands our concern for a possible bowel obstruction.  We are obtaining labs  today.  She will receive IV fluids today.  She will return for additional IV fluids and a CT scan tomorrow.  We will see her in follow-up after the scans have been completed.  We discussed hospitalization.  She is comfortable with outpatient management for now.  Patient seen with Dr. Scherrie Curt.    Diana Forster ANP/GNP-BC   02/13/2024  2:05 PM  This was a shared visit with Diana Forster.  Ms. Huver was interviewed and examined.  She has persistent nausea and abdominal pain.  We are concerned she has progressive ovarian cancer with an early bowel obstruction.  She will receive intravenous fluids and a CT is scheduled for tomorrow.  I was present for greater than 50% of today's visit.  I performed medical decision making.  Anise Kerns, MD

## 2024-02-13 NOTE — Telephone Encounter (Signed)
 Called to report she is not better. Still has nausea and significant abdominal pain. Also feels dizzy. Per Dr. Scherrie Curt: See NP today at 1:15. Patient notified and agrees.

## 2024-02-13 NOTE — Patient Instructions (Signed)

## 2024-02-13 NOTE — Patient Instructions (Signed)
 Fluids Given Through an IV (IV Infusion Therapy): What to Expect IV infusion therapy is a treatment to deliver a fluid, called an infusion, into a vein. You may have IV infusion to get: Fluids. Medicines. Nutrition. Chemotherapy. This is medicines to stop or slow down cancer cells. Blood or blood products. Dye that is given before an MRI or a CT scan. This is called contrast dye. Tell a health care provider about: Any allergies you have. This includes allergies to anesthesia or dyes. All medicines you take. These include vitamins, herbs, eye drops, and creams. Any bleeding problems you have. Any surgeries you've had, including if you've had lymph nodes taken out of your armpit or if you have a arteriovenous fistula for dialysis. Any medical problems you have. Whether you're pregnant or may be pregnant. Whether you've used IV drugs. What are the risks? Your health care provider will talk with you about risks. These may include: Pain, bruising, or bleeding. Infection. The IV leaking or moving out of place. Damage to blood vessels or nerves. Allergic reactions to medicines or dyes. A blood clot. An air bubble in the vein, also called an air embolism. What happens before the procedure? Eat and drink only as you've been told. Ask about changing or stopping: Any medicines you take. Any vitamins, herbs, or supplements you take. What happens during the procedure?     Placing the catheter Your skin at the IV site will be washed with fluid that kills germs. This will help prevent infection. IV infusion therapy starts with a procedure to place a soft tube called a catheter into a vein. An IV tube will be attached to the catheter to let the infusion flow into your blood. Your catheter may be placed: Into a vein that is usually in the bend of the elbow, forearm, or back of the hand. This is called a peripheral IV catheter. This may need to be put into a vein each time you get an  infusion. Into a vein near your elbow. This is called a midline catheter or a peripherally inserted central catheter (PICC). These types of catheters may stay in place for weeks or months at a time so you can receive repeated infusions through it. Into a vein near your neck that leads to your heart. This is called a non-tunneled catheter. This is only used for short amounts of time because it can cause infection. Through the skin of your chest and into a large vein that leads to your heart. This is called a tunneled catheter. This may stay in your body for months or years. Into an implanted port. An implanted port is a device that is surgically inserted under the skin of the chest to provide long-term IV access. The catheter will connect the port to a large vein in the chest or upper arm. A port may be kept in place for many months or years. Each time you have an infusion, a needle will be inserted through your skin to connect the catheter to the port. Doing the infusion To start the infusion, your provider will: Attach the IV tubing to your catheter. Use a tape or a bandage to hold the IV in place against your skin. An IV pump may be used to control the flow of the IV infusion. During the infusion, your provider will check the area to make sure: There is no bleeding, swelling, or pain. Your IV infusion is flowing correctly. After the infusion, your provider will: Take off the bandage  or tape. Disconnect the tubing from the catheter. Remove the catheter, if you have a peripheral IV. Apply pressure over the IV insertion site to stop bleeding, then cover the area with a bandage. If you have an implanted port, PICC, non-tunneled, or tunneled catheter, your catheter may remain in place. This depends on how many times you will need treatment, your medical condition, and what type of catheter you have. These steps may vary. Ask what you can expect. What can I expect after the procedure? You may be  watched closely until you leave. This includes checking your pain level, blood pressure, heart rate, and breathing rate. Your provider will check to make sure there are no signs of infection. Follow these instructions at home: Take your medicines only as told. Change or take off your bandage as told by your provider. Ask what things are safe for you to do at home. Ask when you can go back to work or school. Do not take baths, swim, or use a hot tub until you're told it's OK. Ask if you can shower. Check your IV insertion site every day for signs of infection. Check for: Redness, swelling, or pain. Fluid or blood. If fluid or blood drains from your IV site, use your hands to press down firmly on the area for a minute or two. Doing this should stop the bleeding. Warmth. Pus or a bad smell. Contact a health care provider if: You have signs of infection around your IV site. You have fluid or blood coming from your IV site that does not stop after you put pressure to the site. You have a rash or blisters. You have itchy, red, swollen areas of skin called hives. Get help right away if: You have a fever or chills. You have chest pain. You have trouble breathing. This information is not intended to replace advice given to you by your health care provider. Make sure you discuss any questions you have with your health care provider. Document Revised: 03/15/2023 Document Reviewed: 03/15/2023 Elsevier Patient Education  2024 [employer].

## 2024-02-14 ENCOUNTER — Inpatient Hospital Stay: Admitting: Nurse Practitioner

## 2024-02-14 ENCOUNTER — Other Ambulatory Visit: Payer: Self-pay

## 2024-02-14 ENCOUNTER — Ambulatory Visit (HOSPITAL_BASED_OUTPATIENT_CLINIC_OR_DEPARTMENT_OTHER)
Admission: RE | Admit: 2024-02-14 | Discharge: 2024-02-14 | Disposition: A | Discharge status: Home / Self Care | Source: Ambulatory Visit | Attending: Nurse Practitioner | Admitting: Nurse Practitioner

## 2024-02-14 ENCOUNTER — Encounter: Payer: Self-pay | Admitting: Oncology

## 2024-02-14 ENCOUNTER — Encounter: Payer: Self-pay | Admitting: Nurse Practitioner

## 2024-02-14 ENCOUNTER — Inpatient Hospital Stay

## 2024-02-14 VITALS — BP 125/77 | HR 71 | Temp 98.1°F | Resp 18 | Wt 147.0 lb

## 2024-02-14 DIAGNOSIS — I700 Atherosclerosis of aorta: Secondary | ICD-10-CM | POA: Diagnosis not present

## 2024-02-14 DIAGNOSIS — C786 Secondary malignant neoplasm of retroperitoneum and peritoneum: Secondary | ICD-10-CM | POA: Insufficient documentation

## 2024-02-14 DIAGNOSIS — R180 Malignant ascites: Secondary | ICD-10-CM | POA: Diagnosis not present

## 2024-02-14 DIAGNOSIS — R109 Unspecified abdominal pain: Secondary | ICD-10-CM | POA: Insufficient documentation

## 2024-02-14 DIAGNOSIS — C569 Malignant neoplasm of unspecified ovary: Secondary | ICD-10-CM

## 2024-02-14 DIAGNOSIS — R110 Nausea: Secondary | ICD-10-CM | POA: Insufficient documentation

## 2024-02-14 MED ORDER — SODIUM CHLORIDE 0.9 % IV SOLN
INTRAVENOUS | Status: AC
Start: 2024-02-14 — End: 2024-02-14

## 2024-02-14 MED ORDER — IOHEXOL 300 MG/ML  SOLN
100.0000 mL | Freq: Once | INTRAMUSCULAR | Status: AC | PRN
  Administered 2024-02-14: 100 mL via INTRAVENOUS

## 2024-02-14 MED ORDER — HEPARIN SOD (PORK) LOCK FLUSH 100 UNIT/ML IV SOLN
500.0000 [IU] | Freq: Once | INTRAVENOUS | Status: AC
  Administered 2024-02-14: 500 [IU] via INTRAVENOUS

## 2024-02-14 MED ORDER — PANTOPRAZOLE SODIUM 40 MG PO TBEC
40.0000 mg | DELAYED_RELEASE_TABLET | Freq: Every day | ORAL | 1 refills | Status: DC
Start: 2024-02-14 — End: 2024-08-08

## 2024-02-14 MED ORDER — SODIUM CHLORIDE 0.9% FLUSH
10.0000 mL | INTRAVENOUS | Status: DC | PRN
Start: 2024-02-14 — End: 2024-02-14
  Administered 2024-02-14: 10 mL via INTRAVENOUS

## 2024-02-14 MED ORDER — HYDROCODONE-ACETAMINOPHEN 5-325 MG PO TABS: 1.0000 | ORAL_TABLET | Freq: Four times a day (QID) | ORAL | 0 refills | Status: DC | PRN

## 2024-02-14 NOTE — Progress Notes (Signed)
 [facility] OFFICE PROGRESS NOTE   Diagnosis: Ovarian cancer  INTERVAL HISTORY:   [name] [name] returns as scheduled.  She continues to have nausea and abdominal pain.  She has tried tramadol  with partial temporary improvement.  She belches frequently.  No vomiting.  Last night she developed burning discomfort in the chest when she was laying down.  Symptoms improved when she sat upright.  She tried Tums, Pepto-Bismol, Ativan .  She has had several watery bowel movements.  She is passing gas.  Objective:  Vital signs in last 24 hours:  Blood pressure 125/77, pulse 71, temperature 98.1 F (36.7 C), temperature source Temporal, resp. rate 18, weight 147 lb (66.7 kg), SpO2 98%.    HEENT: No thrush or ulcers. Resp: Lungs clear bilaterally. Cardio: Regular rate and rhythm. GI: Abdomen is distended, firm.  Bowel sounds present. Vascular: No leg edema. Neuro: Alert and oriented. Port-A-Cath without erythema.  Lab Results:  Lab Results  Component Value Date   WBC 4.7 02/13/2024   HGB 12.1 02/13/2024   HCT 37.7 02/13/2024   MCV 99.5 02/13/2024   PLT 341 02/13/2024   NEUTROABS 3.2 02/13/2024    Imaging:  CT ABDOMEN PELVIS W CONTRAST Result Date: 02/14/2024 CLINICAL DATA:  met ovarian cancer; abd pain, nausea, belching, small bm yesterday. * Tracking Code: BO * EXAM: CT ABDOMEN AND PELVIS WITH CONTRAST TECHNIQUE: Multidetector CT imaging of the abdomen and pelvis was performed using the standard protocol following bolus administration of intravenous contrast. RADIATION DOSE REDUCTION: This exam was performed according to the departmental dose-optimization program which includes automated exposure control, adjustment of the mA and/or kV according to patient size and/or use of iterative reconstruction technique. CONTRAST:  OMNIPAQUE  IOHEXOL  300 MG/ML  SOLN COMPARISON:  CT scan abdomen and pelvis from 11/16/2023. FINDINGS: Lower chest: There are patchy atelectatic changes  in the visualized lung bases. No overt consolidation. No pleural effusion. The heart is normal in size. No pericardial effusion. There is mild circumferential thickening of the lower thoracic esophagus, which is most likely seen in the settings of chronic gastroesophageal reflux disease versus esophagitis. Note is made of small-to-moderate paraesophageal hernia. Hepatobiliary: The liver is normal in size. Non-cirrhotic configuration. No suspicious mass. There are stable subcapsular, subcentimeter, hypoattenuating foci in the right hepatic lobe (series 2, images 28 and 34), which are too small to adequately characterize but appears unchanged since the prior study and favored benign in etiology. No intrahepatic or extrahepatic bile duct dilation. Markedly contracted gallbladder. No calcified gallstones. Normal gallbladder wall thickness. No pericholecystic inflammatory changes. Pancreas: Unremarkable. No pancreatic ductal dilatation or surrounding inflammatory changes. Spleen: Within normal limits. No focal lesion. Adrenals/Urinary Tract: Adrenal glands are unremarkable. No suspicious renal mass. There multiple bilateral sinus cysts and cortical cysts throughout bilateral kidneys. There are several, 1-2 mm nonobstructing calculi in the right kidney. There is a linear calcification in the left kidney upper pole, favored to represent vascular calcification. No other nephroureterolithiasis on either side. No obstructive uropathy on either side. Urinary bladder is under distended, precluding optimal assessment. However, no large mass or stones identified. No perivesical fat stranding. Stomach/Bowel: Small to moderate paraesophageal hernia. Rectosigmoid anastomosis noted. No disproportionate dilation of the small or large bowel loops. No evidence of abnormal bowel wall thickening or inflammatory changes. The appendix is unremarkable. Vascular/Lymphatic: Since the prior study, there is interval increase in the amount of  ascites with increasing external mass effect over the liver. There is also interval increase in the soft  tissue nodules mainly in the left upper quadrant. Redemonstration of scattered irregular hyperattenuating peritoneal surface nodularity. Overall, findings favor worsening peritoneal carcinomatosis. No pneumoperitoneum. No abdominal or pelvic lymphadenopathy, by size criteria. No aneurysmal dilation of the major abdominal arteries. There are mild peripheral atherosclerotic vascular calcifications of the aorta and its major branches. Reproductive: The uterus is surgically absent. No large adnexal mass. Other: Mild anasarca. The soft tissues and abdominal wall are otherwise unremarkable. Musculoskeletal: No suspicious osseous lesions. There are moderate multilevel degenerative changes in the visualized spine. Note is made of right hip arthroplasty. IMPRESSION: 1. No acute inflammatory process identified within the abdomen or pelvis. 2. There is interval increase in the amount of ascites and peritoneal carcinomatosis, compatible with worsening metastatic disease. 3. Multiple other nonacute observations, as described above. Aortic Atherosclerosis (ICD10-I70.0). Electronically Signed   By: Beula Brunswick M.D.   On: 02/14/2024 10:40    Medications: I have reviewed the patient's current medications.  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 A 5 mm nodules remain on the right diaphragm. - TumorNext paired germline/tumor analyses: No somatic variants detected, germline CHEK2      VUS      Cycle 1 of adjuvant Taxol /carboplatin  chemotherapy initiated on 09/19/2012.   The CA 125 normalized.   She completed day 15 of cycle 6 on 02/06/2013.   Restaging CT evaluation 03/29/2013 showed no evidence of metastatic disease in the chest. There was marked  improvement in appearance/resolution of previous described peritoneal/omental disease. There was no convincing evidence of residual disease. There was minimal increased density in the region of the omentum favored to be treatment-related. There was no pelvic adenopathy. CA125 3.2 on 02/19/2014. 08/18/2016 CA-125 8 CT abdomen/pelvis 08/25/2016-solitary new enlarged right external iliac lymph node measuring 2.2 cm. Biopsy 09/07/2016-adenocarcinoma consistent with high-grade serous carcinoma.  ER +90%, PR -0%, HER2 negative (0); foundation 1-HRD not detected, microsatellite status cannot be determined, tumor mutation burden 8, FOLR1 positive-80%.   PET scan 09/22/2016-malignant range FDG uptake associated with the enlarged right external iliac lymph node compatible with metastatic adenopathy.  No additional sites of metastatic disease identified. Radiation 10/25/2016-12/01/2016 PET scan 03/28/2017-previously enlarged hypermetabolic right external iliac node-normal in size with resolution of hypermetabolic activity, no active malignancy identified PET scan 05/15/2018-no evidence of recurrent or metastatic disease, no hypermetabolic lymph nodes CT abdomen/pelvis 09/19/2019-mild increase in the size of several left upper quadrant peritoneal nodules measuring up to 11 mm.  No other sites of metastatic disease identified.  No ascites. CT abdomen/pelvis 03/14/2020-slight enlargement of several left upper quadrant peritoneal nodules, no ascites, no other evidence of disease progression CT abdomen/pelvis 09/12/2020 peritoneal nodularity/omental caking predominantly in the left upper/mid abdomen, mildly progressive.  Largest implant in the left upper abdomen adjacent to the stomach now measures 17 mm, previously 13 mm.  Overall volume of peritoneal disease has mildly progressed. CT abdomen/pelvis 01/13/2021- 4 mm right ureteral calculus with moderate right hydronephrosis and upper right hydroureter, progressive omental  nodularity, stable calcified and partially calcified right pelvic nodules CT abdomen/pelvis 05/13/2021-enlargement of left upper quadrant omental nodules and a peritoneal nodule at the right iliac fossa, no ascites CT abdomen/pelvis 08/14/2021-mild increase in size of omental nodules, no ascites, no new site of metastatic disease Taxol /carboplatin  09/09/2021, 09/15/2021, 09/22/2021 Taxol /carboplatin  10/07/2021, 10/13/2021, 10/20/2021 Taxol /Carboplatin  11/03/2021, 11/10/2021, 11/17/2021 CT 11/23/2021-decrease in peritoneal metastases, no new or progressive disease, airspace opacity at both lung bases Taxol /carboplatin   12/01/2021, 12/08/2021, 12/15/2021 Taxol /Carboplatin  every 2 weeks beginning 12/29/2021 Carboplatin  alone 01/26/2022, Taxol  held due to neuropathy Carboplatin  alone 02/09/2022, Taxol  held due to neuropathy Carboplatin  alone 02/23/2022, Taxol  held due to neuropathy Carboplatin  alone 03/09/2022, Taxol  held due to neuropathy CTs 03/25/2022-no significant change in omental, mesenteric disease.  No new or clearly progressive findings.  Previously noted patchy areas of consolidative opacity at the lung bases have resolved. Treatment break beginning 03/26/2022 CT/pelvis 08/10/2022-increased size of peritoneal nodules, increased small volume ascites, mild right lower lobe pleural nodularity Cycle 1 salvage carboplatin  09/14/2022 (weekly x 3 followed by 1 week break) Cycle 2 salvage carboplatin  10/12/2022 (weekly x 3 followed by 1 week break) Cycle 3 salvage carboplatin  11/16/2022 (weekly x 3 followed by 1 week break) CT abdomen/pelvis 12/08/2022-mild increase in peritoneal carcinomatosis Tamoxifen  20 mg daily 01/07/2023 CT abdomen/pelvis 03/29/2023-increased ascites with persistent diffuse peritoneal nodularity, largest omental implants have decreased in size.  No enlarging implants.,  No evidence of bowel obstruction Paracentesis for 4.5 L 06/29/2023 CTs 08/02/2023-peritoneal carcinomatosis similar to slightly improved.   Stable volume of ascites.  No evidence of metastatic disease in the chest. Tamoxifen  20 mg daily continued 08/02/2023 Paracentesis 08/25/2023-4 L of fluid removed, cytology shows adenocarcinoma Plan for Doxil  Baseline 2D echo 09/23/2023-LVEF 55 to 60%.  Left ventricle has normal function.  Abnormal strain with apical sparing, follow-up TTE/MRI suggested in 3 months. Cycle 1 Doxil  10/06/2023 Cycle 2 Doxil  11/03/2023 CT abdomen/pelvis 11/16/2023: Compared to 08/02/2023 there is overall stable disease, new 0.9 cm nodule at the left descending colon, I reviewed the CT images.  Multiple lesions appear unchanged.  Stable ascites and peritoneal thickening Cycle 3 Doxil  12/01/2023 Echocardiogram 12/20/2023: LVEF 55-60% Cycle 4 Doxil  12/27/2023 Cycle 5 Doxil  01/24/2024 CTs abdomen/pelvis 02/14/2024-interval increase in the amount of ascites and peritoneal carcinomatosis.  No acute inflammatory process.   2. Low abdomen/suprapubic pain prior to the exploratory laparotomy-likely secondary to omental/pelvic tumor; persistent mild pain in the lower abdomen   3. Chronic neck and back pain.   4. Anxiety -persistent despite Lexapro  and Xanax . She has been evaluated by psychiatry. Currently on Lexapro . 5. Status post Port-A-Cath placement 09/22/2012. The Port-A-Cath was removed on 04/03/2013.   6. Neutropenia secondary to chemotherapy- day 15 cycle 1 and cycle 3. Taxol /carboplatin  not given secondary to neutropenia.    7. Herpes zoster involving a right thoracic dermatome July 2015. She completed a course of Valtrex . 8. Nodular bony prominence at the left pelvis on rectal exam 06/05/2015-likely a benign finding 9.  Right ureter stone/hydroureteronephrosis on CT 01/13/2021-referred to urology; status post right ureteroscopy with stone extraction and ureteral stent placement.  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  Right hip fracture following a fall 11/02/2022-status post right hip anterior  hemiarthroplasty 12.  Acute localized thrombus right proximal femoral vein 11/16/2022 Eliquis  initiated 13.  Admission 06/24/2023 with volume overload    Disposition: [name] [name] appears unchanged.  She continues to have abdominal pain, distention, nausea.  She had CT scans abdomen/pelvis earlier today.  There has been an increase in ascites and peritoneal carcinomatosis.  We reviewed the results/images with her today.  She understands current treatment with Doxil  will be placed on hold.  We discussed other potential systemic therapies.  She declines mirvetuximab soravtansine due to the possibility of ocular toxicities.  She agrees to a referral to Dr. Orvil Bland for assistance with management of obstructive symptoms and treatment recommendations.  She will continue a liquid diet.  Prescriptions sent to her pharmacy for Protonix  and hydrocodone .  Laxative for constipation.  We are referring her for a paracentesis to see if this may help with some of the symptoms.  She will return for follow-up in 1 week.  We are available to see her sooner if needed.  She will contact the office if unable to maintain adequate oral hydration.  Patient seen with Dr. Scherrie Curt.    Diana Forster ANP/GNP-BC   02/14/2024  11:07 AM  This was a shared visit with Diana Forster.  [name] [name] was interviewed and examined.  We reviewed the CT findings and images with [name] [name] and her daughter.  There is clinical and radiologic evidence of progressive ovarian cancer.  Doxil  will be discontinued.  She is symptomatic with pain and nausea.  She may have early obstruction/ileus related to ovarian cancer that is not apparent on CT.  We will refer her for a therapeutic paracentesis to see if this helps with the nausea and reflux symptoms.  She will begin antiacid therapy.  Systemic treatment options are limited.  We can consider multiple standard salvage chemotherapy agents such as docetaxel and gemcitabine.  She does not wish to consider  murvetuximab.  We will refer her to Dr. Orvil Bland to get her opinion regarding management of the GI symptoms and salvage options.  I was present for greater than 50% of today's visit.  I performed medical decision making.  Anise Kerns, MD

## 2024-02-15 ENCOUNTER — Telehealth: Payer: Self-pay

## 2024-02-15 NOTE — Telephone Encounter (Signed)
 Spoke with patient and got appointment scheduled for 5/16 @ 4:00pm.  Patient confirmed

## 2024-02-16 ENCOUNTER — Ambulatory Visit (HOSPITAL_COMMUNITY)
Admission: RE | Admit: 2024-02-16 | Discharge: 2024-02-16 | Disposition: A | Discharge status: Home / Self Care | Source: Ambulatory Visit | Attending: Nurse Practitioner | Admitting: Nurse Practitioner

## 2024-02-16 DIAGNOSIS — C482 Malignant neoplasm of peritoneum, unspecified: Secondary | ICD-10-CM | POA: Diagnosis not present

## 2024-02-16 DIAGNOSIS — R188 Other ascites: Secondary | ICD-10-CM | POA: Diagnosis not present

## 2024-02-16 DIAGNOSIS — C801 Malignant (primary) neoplasm, unspecified: Secondary | ICD-10-CM | POA: Diagnosis not present

## 2024-02-16 DIAGNOSIS — R180 Malignant ascites: Secondary | ICD-10-CM | POA: Insufficient documentation

## 2024-02-16 DIAGNOSIS — C569 Malignant neoplasm of unspecified ovary: Secondary | ICD-10-CM | POA: Insufficient documentation

## 2024-02-16 MED ORDER — LIDOCAINE HCL 1 % IJ SOLN
INTRAMUSCULAR | Status: AC
  Filled 2024-02-16: qty 20

## 2024-02-16 NOTE — Procedures (Signed)
 Ultrasound-guided diagnostic and therapeutic paracentesis performed yielding 2.1 liters of slightly hazy, yellow fluid. No immediate complications. A portion of the fluid was sent to the lab for cytology. EBL none.

## 2024-02-17 ENCOUNTER — Inpatient Hospital Stay (HOSPITAL_BASED_OUTPATIENT_CLINIC_OR_DEPARTMENT_OTHER): Admitting: Gynecologic Oncology

## 2024-02-17 ENCOUNTER — Encounter: Payer: Self-pay | Admitting: Gynecologic Oncology

## 2024-02-17 VITALS — BP 111/67 | Temp 98.8°F | Resp 16 | Wt 144.0 lb

## 2024-02-17 DIAGNOSIS — R112 Nausea with vomiting, unspecified: Secondary | ICD-10-CM

## 2024-02-17 DIAGNOSIS — R180 Malignant ascites: Secondary | ICD-10-CM

## 2024-02-17 DIAGNOSIS — C786 Secondary malignant neoplasm of retroperitoneum and peritoneum: Secondary | ICD-10-CM | POA: Diagnosis not present

## 2024-02-17 DIAGNOSIS — Z5112 Encounter for antineoplastic immunotherapy: Secondary | ICD-10-CM | POA: Diagnosis not present

## 2024-02-17 DIAGNOSIS — R140 Abdominal distension (gaseous): Secondary | ICD-10-CM | POA: Diagnosis not present

## 2024-02-17 DIAGNOSIS — Z7189 Other specified counseling: Secondary | ICD-10-CM

## 2024-02-17 DIAGNOSIS — Z8542 Personal history of malignant neoplasm of other parts of uterus: Secondary | ICD-10-CM

## 2024-02-17 DIAGNOSIS — G893 Neoplasm related pain (acute) (chronic): Secondary | ICD-10-CM

## 2024-02-17 DIAGNOSIS — C569 Malignant neoplasm of unspecified ovary: Secondary | ICD-10-CM

## 2024-02-17 DIAGNOSIS — Z8543 Personal history of malignant neoplasm of ovary: Secondary | ICD-10-CM | POA: Diagnosis not present

## 2024-02-17 DIAGNOSIS — K219 Gastro-esophageal reflux disease without esophagitis: Secondary | ICD-10-CM | POA: Diagnosis not present

## 2024-02-17 DIAGNOSIS — R55 Syncope and collapse: Secondary | ICD-10-CM | POA: Diagnosis not present

## 2024-02-17 NOTE — Progress Notes (Signed)
 Gynecologic Oncology Return Clinic Visit  02/21/24  Reason for Visit: treatment planning  Treatment History: Oncology History Overview Note  CA-125 10/10/113: 69 02/2013: 5.6, post adjuvant chemotherapy (03/2013-02/2016): 3.2-8.8 08/2016: 10.2, just prior to recurrence diagnosis 02/14/17: 7.1, 3 months post completion of RT 09/17/19: 6.5  In 2014, presented with pelvic pain and pressure, urinary frequency.  In 08/2016, presented with 3 months of constipation and RLQ pain. On exam, had some fullness and tenderness along right pelvic sidewall.    Ovarian cancer (HCC)  07/12/2012 Imaging   PET: significant soft tissue thickening seen throughout the greater omentum suspicious for intraperitoneal carcinoma. There is minimal ascites in the pelvis. it revealed the areas of omental and peritoneal nodularity were hypermetabolic. There is a dominant pelvic omental mass measuring 10.4 x 3.3 cm. There is a small omental implants in the left side of the abdomen measuring 1.1 cm. There were multiple pelvic foci That wereperitoneal based And hypermetabolic. These included the cul-de-sac. She has small volume of ascites.    07/24/2012 Initial Biopsy   CT-guided revealed metastatic carcinoma. By IHC, malignant cells were positive for WT 1, cytokeratin 7, estrogen receptor. There were negative for TTF-1, progesterone receptor, cytokeratin 20, CEA, CD X2, and S100. Overall, the histologic features were highly suggestive of a primary gynecologic origin.   08/22/2012 Surgery   Radical debulking - TAH/BSO, ureterolysis, omentectomy, stripping of pelvic peritoneum, omentectomy, and rectosigmoid resection with low rectal reanastomosis Findings: stage IIIC ovarian cancer. Patient was optimally debulked. Debulking, however, required a rectosigmoid resection as well as total omentectomy total nominal hysterectomy bilateral salpingo-oophorectomy. She was optimally debulked with only 5 mm nodules on the right diaphragm  as residual disease.    08/23/2012 Initial Diagnosis   Ovarian cancer (HCC)   09/2012 - 02/06/2013 Chemotherapy   6 cycles of carboplatin /taxol     03/29/2013 Imaging   CT C/A/P: 1.  Marked response to therapy of omental/peritoneal disease.  No residual measurable disease identified. 2.  Interval hysterectomy and bilateral oophrectomy.   08/25/2016 Imaging   CT A/P: 1. Solitary new enlarged right external iliac lymph node, suspicious for metastatic nodal recurrence. 2. No additional sites of metastatic disease in the abdomen or pelvis. 3. Aortic atherosclerosis.   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 There is malignant range FDG uptake associated with the enlarged right external iliac lymph node compatible with metastatic adenopathy. 2. No additional sites of metastatic disease identified. 3. Aortic atherosclerosis.   10/25/2016 - 12/01/2016 Radiation Therapy   Radiation to right pelvis treated to 60.2Gy in 28 fractions   03/28/2017 Imaging   PET post RT: 1. The previously enlarged and hypermetabolic right external iliac lymph node is currently of normal size and the previous hypermetabolic activity has resolved. No current active malignancy identified. 2. 2 mm right mid kidney nonobstructive renal calculus. 3.  Aortic Atherosclerosis (ICD10-I70.0).   09/23/2017 Imaging   CT A/P: No evidence of bowel obstruction.  Normal appendix. No CT findings to account for the patient's worsening right lower quadrant abdominal pain. No evidence of recurrent or metastatic disease.   10/31/2017 Imaging   PET: No evidence local ovarian carcinoma recurrence in the pelvis. No metastatic adenopathy or distant metastatic disease.   05/15/2018 Imaging   PET: 1. No evidence recurrent or metastatic disease. 2.  Aortic  atherosclerosis (ICD10-170.0).  09/19/2019 Imaging   CT A/P: Mild increase in size of several left upper quadrant peritoneal nodules measuring up to 11 mm, highly suspicious for increased peritoneal carcinomatosis. No other sites of metastatic disease identified. No evidence of ascites.   11/27/2019 Genetic Testing   CHEK2 p.N405K VUS found on the cancerNext HRD testing.  The CancerNext gene panel offered by [employer] includes sequencing and rearrangement analysis for the following 34 genes:   APC, ATM, BARD1, BMPR1A, BRCA1, BRCA2, BRIP1, CDH1, CDK4, CDKN2A, CHEK2, DICER1, HOXB13, EPCAM, GREM1, MLH1, MRE11A, MSH2, MSH6, MUTYH, NBN, NF1, PALB2, PMS2, POLD1, POLE, PTEN, RAD50, RAD51C, RAD51D, SMAD4, SMARCA4, STK11, and TP53.  The report date is November 27, 2019.  HRD testing did not identify any pathogenic variants in the tumor.  Therefore HRD is neg.   09/09/2021 - 03/09/2022 Chemotherapy   Patient is on Treatment Plan : OVARIAN Paclitaxel  / Carboplatin  q7d     09/14/2022 - 11/30/2022 Chemotherapy   Patient is on Treatment Plan : BREAST Paclitaxel  + Carboplatin  q7d     10/06/2023 - 01/24/2024 Chemotherapy   Patient is on Treatment Plan : OVARIAN Liposomal Doxorubicin  (40) q28d     02/28/2024 -  Chemotherapy   Patient is on Treatment Plan : OVARIAN mirvetuximab soravtansine-gynx D1 q21d      08/2022: Initial debulking surgery, R1 Germline: CHEK2 VUS 09/2012-02/2013: 6C carbo/taxol  08/2016: Enlarged R ext iliac lymph node, biopsy + (ER+, HER2 neg, FOLR1 80%, HRD neg) 10/2016: RT to R sidewall Complete imaging response 09/2019: mild peritoneal findings, asymptomatic Low progression of disease on imaging with decision to initiate treatment in late 2022. 09/2021-12/2021: weekly carbo/taxol  01/2022-03/2022: q 2 week carbo (taxol  held due to neuropathy) Stable disease on imaging, treatment break CT 08/2022: Increased peritoneal disease, increase small volume ascites.  Mild right lower lobe  pleural nodularity. 09/2022: Salvage carboplatin  only with weekly dosing followed by 1 week break (3C) CT 12/2022: mild progression of disease 01/2023-07/2023: Tamoxifen   Initial progression of disease on imaging in June 2024.  CT imaging in October 24 showed stable peritoneal carcinomatosis, stable ascites. Patient was transition to single agent Doxil  in 10/2023. CT imaging in 11/2023 showed stable disease, new small nodule at the left descending colon. C5 doxil  01/24/24. CT A/P 02/14/24: Overall increase in disease and ascites.  Interval History: Doing well.  Overall feeling much better after her paracentesis.  Denies significant shortness of breath.  Tolerating liquids, has started to introduce some solid food.  Reports good bowel function.  Had an episode last week where she felt very faint and dizzy.  Past Medical/Surgical History: Past Medical History:  Diagnosis Date   Anxiety and depression     Ascites    Chest pain    a. 09/2004 MV: No isch/infarct.   DJD (degenerative joint disease)    Elevated cholesterol    Endometrial polyp    Family history of bladder cancer    Family history of lymphoma    History of radiation therapy 10/25/16-12/01/16   right pelvis/60.2 Gy in 28 fractions   IRRITABLE BOWEL SYNDROME, HX OF 05/15/2008   Kidney stone    Lower extremity edema    MVP (mitral valve prolapse)    occ palpitations    Ovarian ca (HCC) 07/2012       Rheumatoid arthritis(714.0) ~ 2010   dr Alva Jewels   Right leg DVT (HCC)    a. 11/2022 following R hip fx/surgery-->eliquis .   Shingles     Past Surgical History:  Procedure Laterality Date  ABDOMINAL HYSTERECTOMY  08/22/2012   Procedure: HYSTERECTOMY ABDOMINAL;  Surgeon: Verdia Glad, MD;  Location: [facility];  Service: Gynecology;  Laterality: N/A;   ANTERIOR APPROACH HEMI HIP ARTHROPLASTY Right 11/03/2022   Procedure: ANTERIOR APPROACH HEMI HIP ARTHROPLASTY;  Surgeon: Jerlyn Moons, MD;  Location: [facility];   Service: Orthopedics;  Laterality: Right;   BACK SURGERY  10/04/2006   Dr Gwendlyn Lemmings   COLONOSCOPY  04/20/2004   COLONOSCOPY W/ BIOPSIES  07/04/2014   COLOSTOMY REVISION  08/22/2012   Procedure: COLON RESECTION SIGMOID;  Surgeon: Verdia Glad, MD;  Location: [facility];  Service: Gynecology;;  Rectal Sigmoid resection and low rectal anastomosis   CYST ON NECK  10/04/2009   DEBULKING  08/22/2012   Procedure: DEBULKING;  Surgeon: Verdia Glad, MD;  Location: [facility];  Service: Gynecology;  Laterality: N/A;  Radical tumor debulking, Bilateral Ureterolysis   DILATION AND CURETTAGE OF UTERUS  10/04/2002   WITH HYSTEROSCOPY   HAND SURGERY  10/04/1994   HYSTEROSCOPY  10/04/2002   D&C   IR IMAGING GUIDED PORT INSERTION  09/01/2021   IR PARACENTESIS  09/27/2023   KIDNEY STONE SURGERY Right    with stent placement  Mar 03 2021   NECK SURGERY  10/05/2007   SPURS   OMENTECTOMY  08/22/2012   Procedure: OMENTECTOMY;  Surgeon: Verdia Glad, MD;  Location: [facility];  Service: Gynecology;  Laterality: N/A;   SALPINGOOPHORECTOMY  08/22/2012   Procedure: SALPINGO OOPHORECTOMY;  Surgeon: Verdia Glad, MD;  Location: [facility];  Service: Gynecology;  Laterality: Bilateral;   TUBAL LIGATION  10/04/1978    Family History  Problem Relation Age of Onset   Heart disease Mother    Bladder Cancer Father 7   Osteoporosis Sister    Lymphoma Paternal Aunt    Colon cancer Neg Hx    Rectal cancer Neg Hx    Stomach cancer Neg Hx    Diabetes Neg Hx    Stroke Neg Hx    Colon polyps Neg Hx     Social History   Socioeconomic History   Marital status: Legally Separated    Spouse name: Not on file   Number of children: 2   Years of education: Not on file   Highest education level: Not on file  Occupational History   Occupation: disability d/t RA  Tobacco Use   Smoking status: Never   Smokeless tobacco: Never  Vaping Use   Vaping status: Never Used  Substance and Sexual  Activity   Alcohol use: No   Drug use: No   Sexual activity: Not Currently    Birth control/protection: Post-menopausal, Surgical  Other Topics Concern   Not on file  Social History Narrative   Wylie Heard is her daughter    Household-- pr and husband   Social Drivers of Corporate investment banker Strain: Low Risk  (05/04/2023)   Overall Financial Resource Strain (CARDIA)    Difficulty of Paying Living Expenses: Not hard at all  Food Insecurity: No Food Insecurity (07/07/2023)   Hunger Vital Sign    Worried About Running Out of Food in the Last Year: Never true    Ran Out of Food in the Last Year: Never true  Transportation Needs: No Transportation Needs (07/07/2023)   PRAPARE - Administrator, Civil Service (Medical): No    Lack of Transportation (Non-Medical): No  Physical Activity: Insufficiently Active (05/04/2023)   Exercise Vital Sign    Days  of Exercise per Week: 4 days    Minutes of Exercise per Session: 30 min  Stress: No Stress Concern Present (05/04/2023)   [employer] of Occupational Health - Occupational Stress Questionnaire    Feeling of Stress : Not at all  Social Connections: Moderately Isolated (05/04/2023)   Social Connection and Isolation Panel [NHANES]    Frequency of Communication with Friends and Family: More than three times a week    Frequency of Social Gatherings with Friends and Family: More than three times a week    Attends Religious Services: More than 4 times per year    Active Member of [employer] or Organizations: No    Attends Banker Meetings: Never    Marital Status: Separated    Current Medications:  Current Outpatient Medications:    fish oil-omega-3 fatty acids 1000 MG capsule, Take 1 g by mouth daily., Disp: , Rfl:    furosemide  (LASIX ) 20 MG tablet, Take 20 mg by mouth., Disp: , Rfl:    HYDROcodone -acetaminophen  (NORCO/VICODIN) 5-325 MG tablet, Take 1-2 tablets by mouth every 6 (six) hours as needed for  moderate pain (pain score 4-6). Do not drive while taking, Disp: 40 tablet, Rfl: 0   LORazepam  (ATIVAN ) 0.5 MG tablet, Take 1 tablet (0.5 mg total) by mouth every 8 (eight) hours as needed (nausea). Do not drive while taking, Disp: 30 tablet, Rfl: 0   ondansetron  (ZOFRAN ) 8 MG tablet, Take 1 tablet (8 mg total) by mouth every 8 (eight) hours as needed for nausea or vomiting., Disp: 20 tablet, Rfl: 3   pantoprazole  (PROTONIX ) 40 MG tablet, Take 1 tablet (40 mg total) by mouth daily., Disp: 30 tablet, Rfl: 1   prochlorperazine  (COMPAZINE ) 10 MG tablet, Take 1 tablet (10 mg total) by mouth every 6 (six) hours as needed for nausea or vomiting., Disp: 30 tablet, Rfl: 2   torsemide  (DEMADEX ) 20 MG tablet, Take 20 mg by mouth daily., Disp: , Rfl:  No current facility-administered medications for this visit.  Facility-Administered Medications Ordered in Other Visits:    heparin  lock flush 100 unit/mL, 500 Units, Intracatheter, Once PRN, Roseline Conine, NP   sodium chloride  flush (NS) 0.9 % injection 10 mL, 10 mL, Intracatheter, PRN, Roseline Conine, NP  Review of Systems: + Hearing loss, shortness of breath, chest pain, leg swelling, palpitations, distention, abdominal pain, constipation, diarrhea, hot flashes, pelvic pain, joint pain, back pain, muscle pain/cramps, dizziness, numbness Denies appetite changes, fevers, chills, fatigue, unexplained weight changes. Denies neck lumps or masses, mouth sores, ringing in ears or voice changes. Denies cough or wheezing.  Denies blood in stools, nausea, vomiting, or early satiety. Denies pain with intercourse, dysuria, frequency, hematuria or incontinence. Denies vaginal bleeding or vaginal discharge.   Denies itching, rash, or wounds. Denies headaches or seizures. Denies swollen lymph nodes or glands, denies easy bruising or bleeding. Denies anxiety, depression, confusion, or decreased concentration.  Physical Exam: BP 111/67 (BP Location: Left Arm, Patient  Position: Sitting)   Temp 98.8 F (37.1 C) (Oral)   Resp 16   Wt 144 lb (65.3 kg)   SpO2 96%   BMI 24.72 kg/m  General: Alert, oriented, no acute distress. HEENT: Posterior oropharynx clear, sclera anicteric. Chest: Unlabored breathing on room air.  Laboratory & Radiologic Studies:          Component Ref Range & Units (hover) 3 wk ago 1 mo ago 2 mo ago 3 mo ago 4 mo ago 5 mo ago 6 mo  ago  Cancer Antigen (CA) 125 28.9 34.0 CM 50.6 High  CM 83.6 High  CM 110.0 High  CM 222.0 High  CM      Assessment & Plan: ARBOR LEER is a 73 y.o. woman with recurrent and metastatic HGSOC most recently on doxil  with stable disease, now with progression.  Patient is more symptomatic but has had some relief with recent paracentesis. Does not have symptoms concerning for SBO or partial SBO. Discussed approach to PO intake to limit symptoms.  With regard to treatment, her functional status is overall quite good (still enjoys being outside and active). We discussed possible therapeutic options. Given FOLR1 positive tumor, we discussed improved response (improved survival) with mirvetuxumab to other chemotherapy options. We discussed ocular toxicities specifically and the need for initial ophthalmology evaluation and close monitoring with interventions to prevent AEs. We also discussed the use of avastin alone or avastin with carboplatin .   Ms. Bracher had considerable concern about mirbetuxumab initially and at the time of our last visit. She is more open to discussion of this targeted treatment after our discussion of the improved response/survival seen compared to physicians choice in [facility]. I've asked her to reach out to her ophthalmologist to see if she can get in for an appointment and to let my office and/or Dr. Scherrie Curt know once she has made a decision.   38 minutes of total time was spent for this patient encounter, including preparation, face-to-face counseling with the patient and coordination of  care, and documentation of the encounter.  Wiley Hanger, MD  Division of Gynecologic Oncology  Department of Obstetrics and Gynecology  [facility]

## 2024-02-17 NOTE — Patient Instructions (Signed)
 It was good to see you today.  I will relay to Dr. Scherrie Curt what we talked about.  Please do not hesitate to contact me if you need anything or have any questions.

## 2024-02-18 ENCOUNTER — Other Ambulatory Visit: Payer: Self-pay

## 2024-02-19 ENCOUNTER — Other Ambulatory Visit: Payer: Self-pay | Admitting: Oncology

## 2024-02-19 DIAGNOSIS — C569 Malignant neoplasm of unspecified ovary: Secondary | ICD-10-CM

## 2024-02-19 NOTE — Progress Notes (Signed)
 DISCONTINUE ON PATHWAY REGIMEN - Ovarian     A cycle is every 28 days:     Liposomal doxorubicin    **Always confirm dose/schedule in your pharmacy ordering system**  PRIOR TREATMENT: OVOS90: Liposomal Doxorubicin  (Doxil ) 40 mg/m2 q28 Days; Re-evaluate Every 3 Cycles, Treat Until Complete Response, Unacceptable Toxicity, or Disease Progression  START ON PATHWAY REGIMEN - Ovarian     A cycle is every 21 days:     Mirvetuximab soravtansine-gynx   **Always confirm dose/schedule in your pharmacy ordering system**  Patient Characteristics: Recurrent or Progressive Disease, Fourth Line and Beyond, High Folate Receptor Alpha Expression AND No Prior Mirvetuximab AND Candidate for Mirvetuximab BRCA Mutation Status: Absent Therapeutic Status: Recurrent or Progressive Disease Line of Therapy: Fourth Line and Beyond Folate Receptor Alpha (FR?) Expression: High Mirvetuximab Candidacy: Candidate for Mirvetuximab AND No Prior Mirvetuximab Intent of Therapy: Non-Curative / Palliative Intent, Discussed with Patient

## 2024-02-20 ENCOUNTER — Encounter: Payer: Self-pay | Admitting: Oncology

## 2024-02-20 ENCOUNTER — Telehealth: Payer: Self-pay

## 2024-02-20 ENCOUNTER — Telehealth: Payer: Self-pay | Admitting: *Deleted

## 2024-02-20 LAB — CYTOLOGY - NON PAP

## 2024-02-20 NOTE — Telephone Encounter (Signed)
 [name] [name] called to report after discussion with Dr. Orvil Bland, NP and her daughter, she agrees to proceed with the [facility] treatment. Dr. Danniel Duverney office is working on getting her in with Dr. Pauline Bos for her baseline eye exam. She will call when it is scheduled, so her 1st treatment can be scheduled.  She requests to cancel her 5/20 appointment.

## 2024-02-20 NOTE — Telephone Encounter (Signed)
 I spoke to [name] [name] she states she was seen on Friday by Dr.Tucker. She was given treatment options and wanted Dr.Tucker to know she has chosen the eye treatment.   She is aware I will pass message on to Dr.Tucker and Vira Grieves NP and will call her back with next steps. She was thankful for the call

## 2024-02-21 ENCOUNTER — Other Ambulatory Visit

## 2024-02-21 ENCOUNTER — Ambulatory Visit: Admitting: Oncology

## 2024-02-21 ENCOUNTER — Ambulatory Visit

## 2024-02-21 ENCOUNTER — Encounter: Payer: Self-pay | Admitting: Oncology

## 2024-02-21 NOTE — Telephone Encounter (Signed)
 Could someone please call the patient tomorrow to inquire about whether she was able to get in touch with her ophthalmologist to get an appt? If not, could we call to help facilitate this? Thank you!

## 2024-02-22 NOTE — Telephone Encounter (Signed)
 Thank you! Great - did someone call to pass along the message from us ?

## 2024-02-22 NOTE — Telephone Encounter (Signed)
 Ms.Quraishi called stating her Ophthalmologist is Dr.Groat on [address]. Dr.Tuckers last office note was faxed over to their office.

## 2024-02-23 NOTE — Telephone Encounter (Signed)
 Thank you :)

## 2024-02-24 ENCOUNTER — Telehealth: Payer: Self-pay | Admitting: *Deleted

## 2024-02-24 ENCOUNTER — Telehealth: Payer: Self-pay | Admitting: Nurse Practitioner

## 2024-02-24 ENCOUNTER — Encounter: Payer: Self-pay | Admitting: Oncology

## 2024-02-24 NOTE — Telephone Encounter (Signed)
 Called to report her eye exam w/Dr. Candi Chafe is on 5/29 at 1030. Asking when she can start treatment? Per Dr. Scherrie Curt: Schedule for 5/30-scheduling message sent.

## 2024-02-24 NOTE — Telephone Encounter (Signed)
 Patient has been scheduled for follow-up visit per 02/22/24 LOS.  Pt aware of scheduled appt details.

## 2024-02-28 ENCOUNTER — Other Ambulatory Visit: Payer: Self-pay | Admitting: Oncology

## 2024-02-29 ENCOUNTER — Other Ambulatory Visit: Payer: Self-pay | Admitting: *Deleted

## 2024-02-29 ENCOUNTER — Encounter: Payer: Self-pay | Admitting: Oncology

## 2024-02-29 ENCOUNTER — Telehealth: Payer: Self-pay | Admitting: *Deleted

## 2024-02-29 MED ORDER — ARTIFICIAL TEARS OP SOLN: 1.0000 [drp] | Freq: Four times a day (QID) | OPHTHALMIC | Status: AC

## 2024-02-29 MED ORDER — MAXIDEX 0.1 % OP SUSP
1.0000 [drp] | OPHTHALMIC | 2 refills | Status: DC
Start: 2024-02-29 — End: 2024-03-26

## 2024-02-29 NOTE — Progress Notes (Signed)
 Eye drops needed to begin her new treatment with mirvetuximab soravtansine-gynx (Elahere) Artifical tears: 1-2 drops OU QID and prn  Steroid ophthalmic drop: 1 drop 6 times/day-starting day before treatment till day 4; then 1 drop OU QID days 5-8 of each cycle Called Maxidex  to [facility] after confirmation they have it in stock. Notified Aileen of eye drop script and to pick this up today as well as get artifical tears drops from [facility]. Take the drops to eye appointment tomorrow. She needs to start the steroid drops tomorrow. Asking if she can wear contacts while on this and suggested she discuss this with Dr. Candi Chafe tomorrow, but it is doubtful she can wear them on days she is doing the steroid drops.  Will need eye exam every other cycle for the 1st 8 cycles per careplan.

## 2024-02-29 NOTE — Telephone Encounter (Signed)
 LVM that Dr. Scherrie Curt wants to see her tomorrow afternoon to go over her treatment plan in detail with her since he will not be here on day of treatment. Will see her in afternoon. Requested return call.

## 2024-03-01 ENCOUNTER — Encounter: Payer: Self-pay | Admitting: Nurse Practitioner

## 2024-03-01 ENCOUNTER — Encounter: Payer: Self-pay | Admitting: Oncology

## 2024-03-01 ENCOUNTER — Inpatient Hospital Stay

## 2024-03-01 ENCOUNTER — Inpatient Hospital Stay: Admitting: Nurse Practitioner

## 2024-03-01 VITALS — BP 107/83 | HR 98 | Temp 98.2°F | Resp 18 | Ht 64.0 in | Wt 149.2 lb

## 2024-03-01 DIAGNOSIS — C569 Malignant neoplasm of unspecified ovary: Secondary | ICD-10-CM | POA: Diagnosis not present

## 2024-03-01 DIAGNOSIS — R140 Abdominal distension (gaseous): Secondary | ICD-10-CM | POA: Diagnosis not present

## 2024-03-01 DIAGNOSIS — H04123 Dry eye syndrome of bilateral lacrimal glands: Secondary | ICD-10-CM | POA: Diagnosis not present

## 2024-03-01 DIAGNOSIS — R112 Nausea with vomiting, unspecified: Secondary | ICD-10-CM | POA: Diagnosis not present

## 2024-03-01 DIAGNOSIS — C786 Secondary malignant neoplasm of retroperitoneum and peritoneum: Secondary | ICD-10-CM | POA: Diagnosis not present

## 2024-03-01 DIAGNOSIS — Z8543 Personal history of malignant neoplasm of ovary: Secondary | ICD-10-CM | POA: Diagnosis not present

## 2024-03-01 DIAGNOSIS — Z5112 Encounter for antineoplastic immunotherapy: Secondary | ICD-10-CM | POA: Diagnosis not present

## 2024-03-01 DIAGNOSIS — R55 Syncope and collapse: Secondary | ICD-10-CM | POA: Diagnosis not present

## 2024-03-01 DIAGNOSIS — R180 Malignant ascites: Secondary | ICD-10-CM | POA: Diagnosis not present

## 2024-03-01 DIAGNOSIS — K219 Gastro-esophageal reflux disease without esophagitis: Secondary | ICD-10-CM | POA: Diagnosis not present

## 2024-03-01 DIAGNOSIS — G893 Neoplasm related pain (acute) (chronic): Secondary | ICD-10-CM | POA: Diagnosis not present

## 2024-03-01 LAB — CBC WITH DIFFERENTIAL (CANCER CENTER ONLY)
Abs Immature Granulocytes: 0.02 10*3/uL (ref 0.00–0.07)
Basophils Absolute: 0.1 10*3/uL (ref 0.0–0.1)
Basophils Relative: 1 %
Eosinophils Absolute: 0.1 10*3/uL (ref 0.0–0.5)
Eosinophils Relative: 1 %
HCT: 37.4 % (ref 36.0–46.0)
Hemoglobin: 12 g/dL (ref 12.0–15.0)
Immature Granulocytes: 0 %
Lymphocytes Relative: 11 %
Lymphs Abs: 0.8 10*3/uL (ref 0.7–4.0)
MCH: 31.7 pg (ref 26.0–34.0)
MCHC: 32.1 g/dL (ref 30.0–36.0)
MCV: 98.7 fL (ref 80.0–100.0)
Monocytes Absolute: 0.9 10*3/uL (ref 0.1–1.0)
Monocytes Relative: 12 %
Neutro Abs: 5.5 10*3/uL (ref 1.7–7.7)
Neutrophils Relative %: 75 %
Platelet Count: 289 10*3/uL (ref 150–400)
RBC: 3.79 MIL/uL — ABNORMAL LOW (ref 3.87–5.11)
RDW: 15 % (ref 11.5–15.5)
WBC Count: 7.3 10*3/uL (ref 4.0–10.5)
nRBC: 0 % (ref 0.0–0.2)

## 2024-03-01 LAB — CMP (CANCER CENTER ONLY)
ALT: 22 U/L (ref 0–44)
AST: 25 U/L (ref 15–41)
Albumin: 3.6 g/dL (ref 3.5–5.0)
Alkaline Phosphatase: 74 U/L (ref 38–126)
Anion gap: 13 (ref 5–15)
BUN: 21 mg/dL (ref 8–23)
CO2: 24 mmol/L (ref 22–32)
Calcium: 9.6 mg/dL (ref 8.9–10.3)
Chloride: 105 mmol/L (ref 98–111)
Creatinine: 1 mg/dL (ref 0.44–1.00)
GFR, Estimated: 59 mL/min — ABNORMAL LOW (ref >60)
Glucose, Bld: 127 mg/dL — ABNORMAL HIGH (ref 70–99)
Potassium: 4.1 mmol/L (ref 3.5–5.1)
Sodium: 142 mmol/L (ref 135–145)
Total Bilirubin: <0.2 mg/dL (ref 0.0–1.2)
Total Protein: 6.5 g/dL (ref 6.5–8.1)

## 2024-03-01 NOTE — Progress Notes (Signed)
 [facility] OFFICE PROGRESS NOTE   Diagnosis: Ovarian cancer  INTERVAL HISTORY:   [name] [name] returns for follow-up.  She is scheduled to begin treatment tomorrow with mirvetuximab.  She reports seeing her ophthalmologist earlier today.  She continues to note abdominal distention.  No nausea at present.  Intermittent abdominal pain.  Bowels are moving.  Objective:  Vital signs in last 24 hours:  Blood pressure 107/83, pulse 98, temperature 98.2 F (36.8 C), temperature source Temporal, resp. rate 18, height 5\' 4"  (1.626 m), weight 149 lb 3.2 oz (67.7 kg), SpO2 100%.    Resp: Lungs clear bilaterally. Cardio: Regular rate and rhythm. GI: Abdomen is distended with generalized tenderness. Vascular: No leg edema. Port-A-Cath without erythema.  Lab Results:  Lab Results  Component Value Date   WBC 4.7 02/13/2024   HGB 12.1 02/13/2024   HCT 37.7 02/13/2024   MCV 99.5 02/13/2024   PLT 341 02/13/2024   NEUTROABS 3.2 02/13/2024    Imaging:  No results found.  Medications: I have reviewed the patient's current medications.  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 A 5 mm nodules remain on the right diaphragm. - TumorNext paired germline/tumor analyses: No somatic variants detected, germline CHEK2      VUS      Cycle 1 of adjuvant Taxol /carboplatin  chemotherapy initiated on 09/19/2012.   The CA 125 normalized.   She completed day 15 of cycle 6 on 02/06/2013.   Restaging CT evaluation 03/29/2013 showed no evidence of metastatic disease in the chest. There was marked improvement in appearance/resolution of previous described peritoneal/omental disease. There was no convincing evidence of residual disease. There was minimal increased density in the region of the omentum favored to be  treatment-related. There was no pelvic adenopathy. CA125 3.2 on 02/19/2014. 08/18/2016 CA-125 8 CT abdomen/pelvis 08/25/2016-solitary new enlarged right external iliac lymph node measuring 2.2 cm. Biopsy 09/07/2016-adenocarcinoma consistent with high-grade serous carcinoma.  ER +90%, PR -0%, HER2 negative (0); foundation 1-HRD not detected, microsatellite status cannot be determined, tumor mutation burden 8, FOLR1 positive-80%.   PET scan 09/22/2016-malignant range FDG uptake associated with the enlarged right external iliac lymph node compatible with metastatic adenopathy.  No additional sites of metastatic disease identified. Radiation 10/25/2016-12/01/2016 PET scan 03/28/2017-previously enlarged hypermetabolic right external iliac node-normal in size with resolution of hypermetabolic activity, no active malignancy identified PET scan 05/15/2018-no evidence of recurrent or metastatic disease, no hypermetabolic lymph nodes CT abdomen/pelvis 09/19/2019-mild increase in the size of several left upper quadrant peritoneal nodules measuring up to 11 mm.  No other sites of metastatic disease identified.  No ascites. CT abdomen/pelvis 03/14/2020-slight enlargement of several left upper quadrant peritoneal nodules, no ascites, no other evidence of disease progression CT abdomen/pelvis 09/12/2020 peritoneal nodularity/omental caking predominantly in the left upper/mid abdomen, mildly progressive.  Largest implant in the left upper abdomen adjacent to the stomach now measures 17 mm, previously 13 mm.  Overall volume of peritoneal disease has mildly progressed. CT abdomen/pelvis 01/13/2021- 4 mm right ureteral calculus with moderate right hydronephrosis and upper right hydroureter, progressive omental nodularity, stable calcified and partially calcified right pelvic nodules CT abdomen/pelvis 05/13/2021-enlargement of left upper quadrant omental nodules and a peritoneal nodule at the right iliac fossa, no ascites CT  abdomen/pelvis 08/14/2021-mild increase in size of omental nodules, no ascites, no new site of metastatic disease Taxol /carboplatin  09/09/2021, 09/15/2021, 09/22/2021 Taxol /carboplatin  10/07/2021, 10/13/2021, 10/20/2021 Taxol /Carboplatin  11/03/2021,  11/10/2021, 11/17/2021 CT 11/23/2021-decrease in peritoneal metastases, no new or progressive disease, airspace opacity at both lung bases Taxol /carboplatin  12/01/2021, 12/08/2021, 12/15/2021 Taxol /Carboplatin  every 2 weeks beginning 12/29/2021 Carboplatin  alone 01/26/2022, Taxol  held due to neuropathy Carboplatin  alone 02/09/2022, Taxol  held due to neuropathy Carboplatin  alone 02/23/2022, Taxol  held due to neuropathy Carboplatin  alone 03/09/2022, Taxol  held due to neuropathy CTs 03/25/2022-no significant change in omental, mesenteric disease.  No new or clearly progressive findings.  Previously noted patchy areas of consolidative opacity at the lung bases have resolved. Treatment break beginning 03/26/2022 CT/pelvis 08/10/2022-increased size of peritoneal nodules, increased small volume ascites, mild right lower lobe pleural nodularity Cycle 1 salvage carboplatin  09/14/2022 (weekly x 3 followed by 1 week break) Cycle 2 salvage carboplatin  10/12/2022 (weekly x 3 followed by 1 week break) Cycle 3 salvage carboplatin  11/16/2022 (weekly x 3 followed by 1 week break) CT abdomen/pelvis 12/08/2022-mild increase in peritoneal carcinomatosis Tamoxifen  20 mg daily 01/07/2023 CT abdomen/pelvis 03/29/2023-increased ascites with persistent diffuse peritoneal nodularity, largest omental implants have decreased in size.  No enlarging implants.,  No evidence of bowel obstruction Paracentesis for 4.5 L 06/29/2023 CTs 08/02/2023-peritoneal carcinomatosis similar to slightly improved.  Stable volume of ascites.  No evidence of metastatic disease in the chest. Tamoxifen  20 mg daily continued 08/02/2023 Paracentesis 08/25/2023-4 L of fluid removed, cytology shows adenocarcinoma Plan for  Doxil  Baseline 2D echo 09/23/2023-LVEF 55 to 60%.  Left ventricle has normal function.  Abnormal strain with apical sparing, follow-up TTE/MRI suggested in 3 months. Cycle 1 Doxil  10/06/2023 Cycle 2 Doxil  11/03/2023 CT abdomen/pelvis 11/16/2023: Compared to 08/02/2023 there is overall stable disease, new 0.9 cm nodule at the left descending colon, I reviewed the CT images.  Multiple lesions appear unchanged.  Stable ascites and peritoneal thickening Cycle 3 Doxil  12/01/2023 Echocardiogram 12/20/2023: LVEF 55-60% Cycle 4 Doxil  12/27/2023 Cycle 5 Doxil  01/24/2024 CTs abdomen/pelvis 02/14/2024-interval increase in the amount of ascites and peritoneal carcinomatosis.  No acute inflammatory process. Cycle 1 mirvetuximab 03/02/2024   2. Low abdomen/suprapubic pain prior to the exploratory laparotomy-likely secondary to omental/pelvic tumor; persistent mild pain in the lower abdomen   3. Chronic neck and back pain.   4. Anxiety -persistent despite Lexapro  and Xanax . She has been evaluated by psychiatry. Currently on Lexapro . 5. Status post Port-A-Cath placement 09/22/2012. The Port-A-Cath was removed on 04/03/2013.   6. Neutropenia secondary to chemotherapy- day 15 cycle 1 and cycle 3. Taxol /carboplatin  not given secondary to neutropenia.    7. Herpes zoster involving a right thoracic dermatome July 2015. She completed a course of Valtrex . 8. Nodular bony prominence at the left pelvis on rectal exam 06/05/2015-likely a benign finding 9.  Right ureter stone/hydroureteronephrosis on CT 01/13/2021-referred to urology; status post right ureteroscopy with stone extraction and ureteral stent placement.  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  Right hip fracture following a fall 11/02/2022-status post right hip anterior hemiarthroplasty 12.  Acute localized thrombus right proximal femoral vein 11/16/2022 Eliquis  initiated 13.  Admission 06/24/2023 with volume overload    Disposition: [name] [name] appears  unchanged.  She is scheduled to begin treatment with mirvetuximab tomorrow.  We reviewed potential toxicities including but not limited to hematologic toxicity, ocular toxicity, nausea, hypersensitivity reaction, infusion related reaction, peripheral neuropathy, pneumonitis, constipation or diarrhea with her and her daughter at today's visit.  She was provided with printed information.  She had an ophthalmic exam this morning.  She agrees to proceed with treatment as scheduled 03/02/2024.  We reviewed the schedule for ophthalmic exams (every  other cycle for the first 8 cycles, then as clinically indicated).  She understands the instructions for artificial tears and steroid eyedrops.  She will return for a nadir CBC on 03/12/2024.  We will see her in follow-up prior to cycle 2 on 03/23/2024.  She will contact the office in the interim with any problems.  Patient seen with Dr. Scherrie Curt.      Diana Forster ANP/GNP-BC   03/01/2024  2:11 PM  This was a shared visit with Diana Forster.  [name] [name] has [name] [name] of ovarian cancer despite multiple systemic treatment regimens.  Dr. Orvil Bland commence treatment with mirvetuximab.  We had an extensive discussion with [name] [name] regarding the potential side effects of mirvetuxumab including the chance of neuropathy and ophthalmologic toxicity.  She understands the need for eyedrop prophylaxis and monitoring by her ophthalmologist.  We discussed the expected response rate.  She agrees to proceed.  She is scheduled for cycle 1 mirvetuximab 03/02/2024.  I was present for greater than 50% of today's visit.  I performed medical decision making.  Anise Kerns, MD

## 2024-03-01 NOTE — Telephone Encounter (Addendum)
 [name] [name] called back and agrees to come today at 1:45 pm. On way to eye doctor now for a 10:30 appointment. Faxed last office note and care plan communication as to drug she will receive and what is needed on eye exam today to Dr. Candi Chafe [phone].

## 2024-03-02 ENCOUNTER — Inpatient Hospital Stay

## 2024-03-02 ENCOUNTER — Inpatient Hospital Stay: Admitting: Nurse Practitioner

## 2024-03-02 ENCOUNTER — Other Ambulatory Visit

## 2024-03-02 VITALS — BP 131/82 | HR 79 | Temp 98.1°F | Resp 18

## 2024-03-02 DIAGNOSIS — C569 Malignant neoplasm of unspecified ovary: Secondary | ICD-10-CM

## 2024-03-02 DIAGNOSIS — Z8543 Personal history of malignant neoplasm of ovary: Secondary | ICD-10-CM | POA: Diagnosis not present

## 2024-03-02 DIAGNOSIS — C786 Secondary malignant neoplasm of retroperitoneum and peritoneum: Secondary | ICD-10-CM | POA: Diagnosis not present

## 2024-03-02 DIAGNOSIS — R180 Malignant ascites: Secondary | ICD-10-CM | POA: Diagnosis not present

## 2024-03-02 DIAGNOSIS — K219 Gastro-esophageal reflux disease without esophagitis: Secondary | ICD-10-CM | POA: Diagnosis not present

## 2024-03-02 DIAGNOSIS — G893 Neoplasm related pain (acute) (chronic): Secondary | ICD-10-CM | POA: Diagnosis not present

## 2024-03-02 DIAGNOSIS — Z5112 Encounter for antineoplastic immunotherapy: Secondary | ICD-10-CM | POA: Diagnosis not present

## 2024-03-02 DIAGNOSIS — R112 Nausea with vomiting, unspecified: Secondary | ICD-10-CM | POA: Diagnosis not present

## 2024-03-02 DIAGNOSIS — R55 Syncope and collapse: Secondary | ICD-10-CM | POA: Diagnosis not present

## 2024-03-02 DIAGNOSIS — R140 Abdominal distension (gaseous): Secondary | ICD-10-CM | POA: Diagnosis not present

## 2024-03-02 MED ORDER — PALONOSETRON HCL INJECTION 0.25 MG/5ML
0.2500 mg | Freq: Once | INTRAVENOUS | Status: AC
  Administered 2024-03-02: 0.25 mg via INTRAVENOUS
  Filled 2024-03-02: qty 5

## 2024-03-02 MED ORDER — HEPARIN SOD (PORK) LOCK FLUSH 100 UNIT/ML IV SOLN
250.0000 [IU] | Freq: Once | INTRAVENOUS | Status: DC | PRN
Start: 2024-03-02 — End: 2024-03-02

## 2024-03-02 MED ORDER — ACETAMINOPHEN 325 MG PO TABS
650.0000 mg | ORAL_TABLET | Freq: Once | ORAL | Status: AC
  Administered 2024-03-02: 650 mg via ORAL
  Filled 2024-03-02: qty 2

## 2024-03-02 MED ORDER — SODIUM CHLORIDE 0.9% FLUSH: 3.0000 mL | INTRAVENOUS | Status: DC | PRN

## 2024-03-02 MED ORDER — DIPHENHYDRAMINE HCL 50 MG/ML IJ SOLN
25.0000 mg | Freq: Once | INTRAMUSCULAR | Status: AC
  Administered 2024-03-02: 25 mg via INTRAVENOUS
  Filled 2024-03-02: qty 1

## 2024-03-02 MED ORDER — ALTEPLASE 2 MG IJ SOLR
2.0000 mg | Freq: Once | INTRAMUSCULAR | Status: DC | PRN
Start: 2024-03-02 — End: 2024-03-02

## 2024-03-02 MED ORDER — HEPARIN SOD (PORK) LOCK FLUSH 100 UNIT/ML IV SOLN
500.0000 [IU] | Freq: Once | INTRAVENOUS | Status: AC | PRN
  Administered 2024-03-02: 500 [IU]

## 2024-03-02 MED ORDER — SODIUM CHLORIDE 0.9% FLUSH
10.0000 mL | INTRAVENOUS | Status: DC | PRN
  Administered 2024-03-02: 10 mL

## 2024-03-02 MED ORDER — MIRVETUXIMAB SORAVTANSINE-GYNX CHEMO 100 MG/20ML IV SOLN
350.0000 mg | Freq: Once | INTRAVENOUS | Status: AC
  Administered 2024-03-02: 350 mg via INTRAVENOUS
  Filled 2024-03-02: qty 70

## 2024-03-02 MED ORDER — DEXTROSE 5 % IV SOLN: INTRAVENOUS | Status: DC

## 2024-03-02 MED ORDER — DEXAMETHASONE SODIUM PHOSPHATE 10 MG/ML IJ SOLN
10.0000 mg | Freq: Once | INTRAMUSCULAR | Status: AC
  Administered 2024-03-02: 10 mg via INTRAVENOUS
  Filled 2024-03-02: qty 1

## 2024-03-02 NOTE — Patient Instructions (Signed)
Mirvetuximab Soravtansine Injection What is this medication? MIRVETUXIMAB SORAVTANSINE (MIR ve TUX i mab SOE rav TAN seen) treats ovarian cancer. It works by blocking a protein that causes cancer cells to grow and multiply. This helps to slow or stop the spread of cancer cells. This medicine may be used for other purposes; ask your health care provider or pharmacist if you have questions. COMMON BRAND NAME(S): ELAHERE What should I tell my care team before I take this medication? They need to know if you have any of these conditions: Eye disease Liver disease Lung disease Tingling of the fingers or toes or other nerve disorder Vision problems Wear contact lenses An unusual or allergic reaction to mirvetuximab soravtansine, other medications, foods, dyes, or preservatives Pregnant or trying to get pregnant Breast-feeding How should I use this medication? This medication is injected into a vein. It is given by your care team in a hospital or clinic setting. A special MedGuide will be given to you before each treatment. Be sure to read this information carefully each time. Talk to your care team about the use of this medication in children. Special care may be needed. Overdosage: If you think you have taken too much of this medicine contact a poison control center or emergency room at once. NOTE: This medicine is only for you. Do not share this medicine with others. What if I miss a dose? Keep appointments for follow-up doses. It is important not to miss your dose. Call your care team if you are unable to keep an appointment. What may interact with this medication? Interactions have not been studied. Some medications may affect this one. Tell your care team all of the medications you take. This list may not describe all possible interactions. Give your health care provider a list of all the medicines, herbs, non-prescription drugs, or dietary supplements you use. Also tell them if you smoke,  drink alcohol, or use illegal drugs. Some items may interact with your medicine. What should I watch for while using this medication? This medication may make you feel generally unwell. This is not uncommon as chemotherapy can affect healthy cells as well as cancer cells. Report any side effects. Continue your course of treatment even though you feel ill unless your care team tells you to stop. This medication can cause serious infusion reactions. To reduce the risk, your care team may give you other medications to take before receiving this one. Be sure to follow the directions from your care team. This medication may cause dry eyes. Lubricating eye drops may help. See your care team if the problem does not go away or is severe. Your vision may be tested before and during use of this medication. Tell your care team right away if you have any change in your eyesight. This medication may increase your risk of getting an infection. Call your care team for advice if you get a fever, chills, sore throat, or other symptoms of a cold or flu. Do not treat yourself. Try to avoid being around people who are sick. Avoid taking medications that contain aspirin, acetaminophen, ibuprofen, naproxen, or ketoprofen unless instructed by your care team. These medications may hide a fever. Talk to your care team if you wish to become pregnant or think you might be pregnant. Serious birth defects can occur if you take this medication during pregnancy. A negative pregnancy test is required before starting this medication. Contraception is recommended while taking this medication and for 7 months after the last  dose. Your care team can help you find the option that works best for you. Do not breast-feed while taking this medication and for 1 month after stopping it. What side effects may I notice from receiving this medication? Side effects that you should report to your care team as soon as possible: Allergic reactions--skin  rash, itching, hives, swelling of the face, lips, tongue, or throat Dry cough, shortness of breath or trouble breathing Dry eyes Eye pain, redness, irritation, or discharge with blurry or decreased vision Infection--fever, chills, cough, or sore throat Infusion reactions--chest pain, shortness of breath or trouble breathing, feeling faint or lightheaded Liver injury--right upper belly pain, loss of appetite, nausea, light-colored stool, dark yellow or brown urine, yellowing skin or eyes, unusual weakness or fatigue Low magnesium level--muscle pain or cramps, unusual weakness or fatigue, fast or irregular heartbeat, tremors Low red blood cell level--unusual weakness or fatigue, dizziness, headache, trouble breathing Pain, tingling, or numbness in the hands or feet Sensitivity to light Side effects that usually do not require medical attention (report these to your care team if they continue or are bothersome): Constipation Diarrhea Nausea Stomach pain This list may not describe all possible side effects. Call your doctor for medical advice about side effects. You may report side effects to FDA at 1-800-FDA-1088. Where should I keep my medication? This medication is given in a hospital or clinic. It will not be stored at home. NOTE: This sheet is a summary. It may not cover all possible information. If you have questions about this medicine, talk to your doctor, pharmacist, or health care provider.  2024 Elsevier/Gold Standard (2022-03-17 00:00:00)

## 2024-03-05 ENCOUNTER — Telehealth: Payer: Self-pay

## 2024-03-05 NOTE — Telephone Encounter (Signed)
 24 Hour Callback Reached out to patient regarding chemotherapy infusion on 03/02/24. Patient states she is doing well, but has had some nausea. She has been able to control nausea with home medications. Patient educated to call us  if any questions or concerns arise.

## 2024-03-06 ENCOUNTER — Telehealth: Payer: Self-pay

## 2024-03-06 NOTE — Telephone Encounter (Signed)
 Office notes from Dr.Groats office received from pt's appointment on 5/29. Notes placed for Dr. Orvil Bland to review.

## 2024-03-12 ENCOUNTER — Telehealth: Payer: Self-pay

## 2024-03-12 ENCOUNTER — Inpatient Hospital Stay: Attending: Oncology

## 2024-03-12 ENCOUNTER — Telehealth: Payer: Self-pay | Admitting: Nurse Practitioner

## 2024-03-12 DIAGNOSIS — Z5112 Encounter for antineoplastic immunotherapy: Secondary | ICD-10-CM | POA: Insufficient documentation

## 2024-03-12 DIAGNOSIS — C569 Malignant neoplasm of unspecified ovary: Secondary | ICD-10-CM | POA: Diagnosis not present

## 2024-03-12 DIAGNOSIS — G620 Drug-induced polyneuropathy: Secondary | ICD-10-CM | POA: Diagnosis not present

## 2024-03-12 DIAGNOSIS — C786 Secondary malignant neoplasm of retroperitoneum and peritoneum: Secondary | ICD-10-CM | POA: Diagnosis not present

## 2024-03-12 DIAGNOSIS — D701 Agranulocytosis secondary to cancer chemotherapy: Secondary | ICD-10-CM | POA: Diagnosis not present

## 2024-03-12 LAB — CBC WITH DIFFERENTIAL (CANCER CENTER ONLY)
Abs Immature Granulocytes: 0.04 10*3/uL (ref 0.00–0.07)
Basophils Absolute: 0 10*3/uL (ref 0.0–0.1)
Basophils Relative: 1 %
Eosinophils Absolute: 0.1 10*3/uL (ref 0.0–0.5)
Eosinophils Relative: 1 %
HCT: 40.1 % (ref 36.0–46.0)
Hemoglobin: 12.8 g/dL (ref 12.0–15.0)
Immature Granulocytes: 1 %
Lymphocytes Relative: 14 %
Lymphs Abs: 1.1 10*3/uL (ref 0.7–4.0)
MCH: 31.1 pg (ref 26.0–34.0)
MCHC: 31.9 g/dL (ref 30.0–36.0)
MCV: 97.3 fL (ref 80.0–100.0)
Monocytes Absolute: 0.9 10*3/uL (ref 0.1–1.0)
Monocytes Relative: 13 %
Neutro Abs: 5.4 10*3/uL (ref 1.7–7.7)
Neutrophils Relative %: 70 %
Platelet Count: 237 10*3/uL (ref 150–400)
RBC: 4.12 MIL/uL (ref 3.87–5.11)
RDW: 15 % (ref 11.5–15.5)
WBC Count: 7.6 10*3/uL (ref 4.0–10.5)
nRBC: 0 % (ref 0.0–0.2)

## 2024-03-12 NOTE — Telephone Encounter (Signed)
 LVM regarding CBC results from today. Reminded patient of upcoming appointments on the 16th.

## 2024-03-12 NOTE — Telephone Encounter (Signed)
 PT called in for appt date and time

## 2024-03-12 NOTE — Telephone Encounter (Signed)
-----   Message from Diana Forster sent at 03/12/2024  9:47 AM EDT ----- Please let her know CBC looks good.  Follow-up as scheduled.

## 2024-03-16 ENCOUNTER — Other Ambulatory Visit: Payer: Self-pay | Admitting: Oncology

## 2024-03-16 ENCOUNTER — Telehealth: Payer: Self-pay

## 2024-03-16 DIAGNOSIS — H259 Unspecified age-related cataract: Secondary | ICD-10-CM | POA: Diagnosis not present

## 2024-03-16 DIAGNOSIS — C569 Malignant neoplasm of unspecified ovary: Secondary | ICD-10-CM

## 2024-03-16 DIAGNOSIS — C786 Secondary malignant neoplasm of retroperitoneum and peritoneum: Secondary | ICD-10-CM | POA: Diagnosis not present

## 2024-03-16 MED ORDER — HYDROCODONE-ACETAMINOPHEN 5-325 MG PO TABS
1.0000 | ORAL_TABLET | Freq: Four times a day (QID) | ORAL | 0 refills | Status: DC | PRN
Start: 2024-03-16 — End: 2024-08-08

## 2024-03-16 NOTE — Telephone Encounter (Signed)
 Patient called and request a refill of her pain medication. She is currently having back pain.

## 2024-03-16 NOTE — Telephone Encounter (Signed)
 Telephone call to patient informing her that her appointment starts at 0830 on Monday 03/19/2024 and to arrive 15 minutes before this time. She was agreeable and verbalized understanding.

## 2024-03-19 ENCOUNTER — Encounter: Payer: Self-pay | Admitting: Nurse Practitioner

## 2024-03-19 ENCOUNTER — Other Ambulatory Visit: Payer: Self-pay | Admitting: Nurse Practitioner

## 2024-03-19 ENCOUNTER — Inpatient Hospital Stay

## 2024-03-19 ENCOUNTER — Encounter: Payer: Self-pay | Admitting: Oncology

## 2024-03-19 ENCOUNTER — Ambulatory Visit

## 2024-03-19 ENCOUNTER — Inpatient Hospital Stay: Admitting: Nurse Practitioner

## 2024-03-19 VITALS — BP 125/84 | HR 76 | Temp 98.1°F | Resp 18 | Ht 64.0 in | Wt 145.4 lb

## 2024-03-19 DIAGNOSIS — C569 Malignant neoplasm of unspecified ovary: Secondary | ICD-10-CM | POA: Diagnosis not present

## 2024-03-19 DIAGNOSIS — C786 Secondary malignant neoplasm of retroperitoneum and peritoneum: Secondary | ICD-10-CM | POA: Diagnosis not present

## 2024-03-19 DIAGNOSIS — Z5112 Encounter for antineoplastic immunotherapy: Secondary | ICD-10-CM | POA: Diagnosis not present

## 2024-03-19 DIAGNOSIS — D701 Agranulocytosis secondary to cancer chemotherapy: Secondary | ICD-10-CM | POA: Diagnosis not present

## 2024-03-19 DIAGNOSIS — Z95828 Presence of other vascular implants and grafts: Secondary | ICD-10-CM

## 2024-03-19 DIAGNOSIS — G620 Drug-induced polyneuropathy: Secondary | ICD-10-CM | POA: Diagnosis not present

## 2024-03-19 LAB — CMP (CANCER CENTER ONLY)
ALT: 34 U/L (ref 0–44)
AST: 33 U/L (ref 15–41)
Albumin: 3.7 g/dL (ref 3.5–5.0)
Alkaline Phosphatase: 87 U/L (ref 38–126)
Anion gap: 12 (ref 5–15)
BUN: 21 mg/dL (ref 8–23)
CO2: 24 mmol/L (ref 22–32)
Calcium: 9.6 mg/dL (ref 8.9–10.3)
Chloride: 103 mmol/L (ref 98–111)
Creatinine: 0.63 mg/dL (ref 0.44–1.00)
GFR, Estimated: >60 mL/min (ref >60)
Glucose, Bld: 93 mg/dL (ref 70–99)
Potassium: 4.1 mmol/L (ref 3.5–5.1)
Sodium: 140 mmol/L (ref 135–145)
Total Bilirubin: 0.3 mg/dL (ref 0.0–1.2)
Total Protein: 6.8 g/dL (ref 6.5–8.1)

## 2024-03-19 LAB — CBC WITH DIFFERENTIAL (CANCER CENTER ONLY)
Abs Immature Granulocytes: 0.02 10*3/uL (ref 0.00–0.07)
Basophils Absolute: 0 10*3/uL (ref 0.0–0.1)
Basophils Relative: 1 %
Eosinophils Absolute: 0.1 10*3/uL (ref 0.0–0.5)
Eosinophils Relative: 1 %
HCT: 38.8 % (ref 36.0–46.0)
Hemoglobin: 12.4 g/dL (ref 12.0–15.0)
Immature Granulocytes: 0 %
Lymphocytes Relative: 14 %
Lymphs Abs: 0.9 10*3/uL (ref 0.7–4.0)
MCH: 31.2 pg (ref 26.0–34.0)
MCHC: 32 g/dL (ref 30.0–36.0)
MCV: 97.5 fL (ref 80.0–100.0)
Monocytes Absolute: 0.7 10*3/uL (ref 0.1–1.0)
Monocytes Relative: 12 %
Neutro Abs: 4.5 10*3/uL (ref 1.7–7.7)
Neutrophils Relative %: 72 %
Platelet Count: 319 10*3/uL (ref 150–400)
RBC: 3.98 MIL/uL (ref 3.87–5.11)
RDW: 14.8 % (ref 11.5–15.5)
WBC Count: 6.3 10*3/uL (ref 4.0–10.5)
nRBC: 0 % (ref 0.0–0.2)

## 2024-03-19 MED ORDER — SODIUM CHLORIDE 0.9% FLUSH: 10.0000 mL | INTRAVENOUS | Status: DC | PRN

## 2024-03-19 NOTE — Progress Notes (Signed)
 [facility] OFFICE PROGRESS NOTE   Diagnosis: Ovarian cancer  INTERVAL HISTORY:   Ms. Dolin returns as scheduled.  She completed cycle 1 mirvetuximab 03/02/2024.  She denies any eye symptoms.  No change in baseline intermittent nausea.  No mouth sores.  No rash.  She had a loose stool yesterday after eating out.  No change in baseline neuropathy symptoms.  No fever or cough.  She has mild stable dyspnea on exertion.  Objective:  Vital signs in last 24 hours:  Blood pressure 125/84, pulse 76, temperature 98.1 F (36.7 C), resp. rate 18, height 5' 4 (1.626 m), weight 145 lb 6.4 oz (66 kg), SpO2 98%.    HEENT: No thrush or ulcers.  No conjunctival erythema. Resp: Lungs clear bilaterally. Cardio: Regular rate and rhythm. GI: Abdomen distended, tender right abdomen. Vascular: No leg edema. Skin: No rash. Port-A-Cath without erythema.  Lab Results:  Lab Results  Component Value Date   WBC 6.3 03/19/2024   HGB 12.4 03/19/2024   HCT 38.8 03/19/2024   MCV 97.5 03/19/2024   PLT 319 03/19/2024   NEUTROABS 4.5 03/19/2024    Imaging:  No results found.  Medications: I have reviewed the patient's current medications.  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 A 5 mm nodules remain on the right diaphragm. - TumorNext paired germline/tumor analyses: No somatic variants detected, germline CHEK2      VUS      Cycle 1 of adjuvant Taxol /carboplatin  chemotherapy initiated on 09/19/2012.   The CA 125 normalized.   She completed day 15 of cycle 6 on 02/06/2013.   Restaging CT evaluation 03/29/2013 showed no evidence of metastatic disease in the chest. There was marked improvement in appearance/resolution of previous described peritoneal/omental disease. There was no convincing evidence of residual disease.  There was minimal increased density in the region of the omentum favored to be treatment-related. There was no pelvic adenopathy. CA125 3.2 on 02/19/2014. 08/18/2016 CA-125 8 CT abdomen/pelvis 08/25/2016-solitary new enlarged right external iliac lymph node measuring 2.2 cm. Biopsy 09/07/2016-adenocarcinoma consistent with high-grade serous carcinoma.  ER +90%, PR -0%, HER2 negative (0); foundation 1-HRD not detected, microsatellite status cannot be determined, tumor mutation burden 8, FOLR1 positive-80%.   PET scan 09/22/2016-malignant range FDG uptake associated with the enlarged right external iliac lymph node compatible with metastatic adenopathy.  No additional sites of metastatic disease identified. Radiation 10/25/2016-12/01/2016 PET scan 03/28/2017-previously enlarged hypermetabolic right external iliac node-normal in size with resolution of hypermetabolic activity, no active malignancy identified PET scan 05/15/2018-no evidence of recurrent or metastatic disease, no hypermetabolic lymph nodes CT abdomen/pelvis 09/19/2019-mild increase in the size of several left upper quadrant peritoneal nodules measuring up to 11 mm.  No other sites of metastatic disease identified.  No ascites. CT abdomen/pelvis 03/14/2020-slight enlargement of several left upper quadrant peritoneal nodules, no ascites, no other evidence of disease progression CT abdomen/pelvis 09/12/2020 peritoneal nodularity/omental caking predominantly in the left upper/mid abdomen, mildly progressive.  Largest implant in the left upper abdomen adjacent to the stomach now measures 17 mm, previously 13 mm.  Overall volume of peritoneal disease has mildly progressed. CT abdomen/pelvis 01/13/2021- 4 mm right ureteral calculus with moderate right hydronephrosis and upper right hydroureter, progressive omental nodularity, stable calcified and partially calcified right pelvic nodules CT abdomen/pelvis 05/13/2021-enlargement of left upper quadrant  omental nodules and a peritoneal nodule at the right iliac fossa, no ascites  CT abdomen/pelvis 08/14/2021-mild increase in size of omental nodules, no ascites, no new site of metastatic disease Taxol /carboplatin  09/09/2021, 09/15/2021, 09/22/2021 Taxol /carboplatin  10/07/2021, 10/13/2021, 10/20/2021 Taxol /Carboplatin  11/03/2021, 11/10/2021, 11/17/2021 CT 11/23/2021-decrease in peritoneal metastases, no new or progressive disease, airspace opacity at both lung bases Taxol /carboplatin  12/01/2021, 12/08/2021, 12/15/2021 Taxol /Carboplatin  every 2 weeks beginning 12/29/2021 Carboplatin  alone 01/26/2022, Taxol  held due to neuropathy Carboplatin  alone 02/09/2022, Taxol  held due to neuropathy Carboplatin  alone 02/23/2022, Taxol  held due to neuropathy Carboplatin  alone 03/09/2022, Taxol  held due to neuropathy CTs 03/25/2022-no significant change in omental, mesenteric disease.  No new or clearly progressive findings.  Previously noted patchy areas of consolidative opacity at the lung bases have resolved. Treatment break beginning 03/26/2022 CT/pelvis 08/10/2022-increased size of peritoneal nodules, increased small volume ascites, mild right lower lobe pleural nodularity Cycle 1 salvage carboplatin  09/14/2022 (weekly x 3 followed by 1 week break) Cycle 2 salvage carboplatin  10/12/2022 (weekly x 3 followed by 1 week break) Cycle 3 salvage carboplatin  11/16/2022 (weekly x 3 followed by 1 week break) CT abdomen/pelvis 12/08/2022-mild increase in peritoneal carcinomatosis Tamoxifen  20 mg daily 01/07/2023 CT abdomen/pelvis 03/29/2023-increased ascites with persistent diffuse peritoneal nodularity, largest omental implants have decreased in size.  No enlarging implants.,  No evidence of bowel obstruction Paracentesis for 4.5 L 06/29/2023 CTs 08/02/2023-peritoneal carcinomatosis similar to slightly improved.  Stable volume of ascites.  No evidence of metastatic disease in the chest. Tamoxifen  20 mg daily continued 08/02/2023 Paracentesis  08/25/2023-4 L of fluid removed, cytology shows adenocarcinoma Plan for Doxil  Baseline 2D echo 09/23/2023-LVEF 55 to 60%.  Left ventricle has normal function.  Abnormal strain with apical sparing, follow-up TTE/MRI suggested in 3 months. Cycle 1 Doxil  10/06/2023 Cycle 2 Doxil  11/03/2023 CT abdomen/pelvis 11/16/2023: Compared to 08/02/2023 there is overall stable disease, new 0.9 cm nodule at the left descending colon, I reviewed the CT images.  Multiple lesions appear unchanged.  Stable ascites and peritoneal thickening Cycle 3 Doxil  12/01/2023 Echocardiogram 12/20/2023: LVEF 55-60% Cycle 4 Doxil  12/27/2023 Cycle 5 Doxil  01/24/2024 CTs abdomen/pelvis 02/14/2024-interval increase in the amount of ascites and peritoneal carcinomatosis.  No acute inflammatory process. Cycle 1 mirvetuximab 03/02/2024 Cycle 2 more mirvetuximab planned 03/26/2024   2. Low abdomen/suprapubic pain prior to the exploratory laparotomy-likely secondary to omental/pelvic tumor; persistent mild pain in the lower abdomen   3. Chronic neck and back pain.   4. Anxiety -persistent despite Lexapro  and Xanax . She has been evaluated by psychiatry. Currently on Lexapro . 5. Status post Port-A-Cath placement 09/22/2012. The Port-A-Cath was removed on 04/03/2013.   6. Neutropenia secondary to chemotherapy- day 15 cycle 1 and cycle 3. Taxol /carboplatin  not given secondary to neutropenia.    7. Herpes zoster involving a right thoracic dermatome July 2015. She completed a course of Valtrex . 8. Nodular bony prominence at the left pelvis on rectal exam 06/05/2015-likely a benign finding 9.  Right ureter stone/hydroureteronephrosis on CT 01/13/2021-referred to urology; status post right ureteroscopy with stone extraction and ureteral stent placement.  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  Right hip fracture following a fall 11/02/2022-status post right hip anterior hemiarthroplasty 12.  Acute localized thrombus right proximal femoral vein  11/16/2022 Eliquis  initiated 13.  Admission 06/24/2023 with volume overload    Disposition: Ms. Freeburg appears stable.  She has completed 1 cycle of mirvetuximab.  She seems to have tolerated well.  Plan for cycle 2 03/26/2024 (move treatment to Mondays per her request).  CBC and chemistry panel reviewed.  She will need repeat labs prior to treatment in 1  week.  She will return for lab and cycle 2 mirvetuximab on 03/26/2024.  We will see her prior to cycle 3 on 04/16/2024.    Diana Forster ANP/GNP-BC   03/19/2024  9:31 AM

## 2024-03-20 ENCOUNTER — Other Ambulatory Visit

## 2024-03-20 ENCOUNTER — Encounter: Payer: Self-pay | Admitting: Oncology

## 2024-03-20 ENCOUNTER — Ambulatory Visit: Admitting: Oncology

## 2024-03-20 ENCOUNTER — Ambulatory Visit

## 2024-03-20 LAB — CA 125: Cancer Antigen (CA) 125: 14.2 U/mL (ref 0.0–38.1)

## 2024-03-22 ENCOUNTER — Encounter: Payer: Self-pay | Admitting: Oncology

## 2024-03-22 ENCOUNTER — Other Ambulatory Visit: Payer: Self-pay | Admitting: *Deleted

## 2024-03-22 DIAGNOSIS — C569 Malignant neoplasm of unspecified ovary: Secondary | ICD-10-CM

## 2024-03-23 ENCOUNTER — Ambulatory Visit: Admitting: Nurse Practitioner

## 2024-03-23 ENCOUNTER — Ambulatory Visit

## 2024-03-23 ENCOUNTER — Other Ambulatory Visit

## 2024-03-23 ENCOUNTER — Telehealth: Payer: Self-pay | Admitting: Oncology

## 2024-03-23 NOTE — Telephone Encounter (Signed)
 Left PT a message, to schedule appointment for today.

## 2024-03-26 ENCOUNTER — Inpatient Hospital Stay

## 2024-03-26 ENCOUNTER — Other Ambulatory Visit: Payer: Self-pay | Admitting: *Deleted

## 2024-03-26 ENCOUNTER — Other Ambulatory Visit

## 2024-03-26 ENCOUNTER — Encounter: Payer: Self-pay | Admitting: Oncology

## 2024-03-26 ENCOUNTER — Inpatient Hospital Stay: Admitting: Nutrition

## 2024-03-26 ENCOUNTER — Ambulatory Visit: Admitting: Nurse Practitioner

## 2024-03-26 ENCOUNTER — Other Ambulatory Visit: Payer: Self-pay | Admitting: Oncology

## 2024-03-26 ENCOUNTER — Other Ambulatory Visit (HOSPITAL_BASED_OUTPATIENT_CLINIC_OR_DEPARTMENT_OTHER): Payer: Self-pay

## 2024-03-26 ENCOUNTER — Other Ambulatory Visit: Payer: Self-pay | Admitting: Nurse Practitioner

## 2024-03-26 VITALS — BP 137/83 | HR 77 | Temp 98.2°F | Resp 16

## 2024-03-26 DIAGNOSIS — C569 Malignant neoplasm of unspecified ovary: Secondary | ICD-10-CM

## 2024-03-26 DIAGNOSIS — C786 Secondary malignant neoplasm of retroperitoneum and peritoneum: Secondary | ICD-10-CM | POA: Diagnosis not present

## 2024-03-26 DIAGNOSIS — Z5112 Encounter for antineoplastic immunotherapy: Secondary | ICD-10-CM | POA: Diagnosis not present

## 2024-03-26 DIAGNOSIS — D701 Agranulocytosis secondary to cancer chemotherapy: Secondary | ICD-10-CM | POA: Diagnosis not present

## 2024-03-26 DIAGNOSIS — G620 Drug-induced polyneuropathy: Secondary | ICD-10-CM | POA: Diagnosis not present

## 2024-03-26 LAB — CBC WITH DIFFERENTIAL (CANCER CENTER ONLY)
Abs Immature Granulocytes: 0.02 10*3/uL (ref 0.00–0.07)
Basophils Absolute: 0 10*3/uL (ref 0.0–0.1)
Basophils Relative: 1 %
Eosinophils Absolute: 0.1 10*3/uL (ref 0.0–0.5)
Eosinophils Relative: 2 %
HCT: 37.6 % (ref 36.0–46.0)
Hemoglobin: 12.1 g/dL (ref 12.0–15.0)
Immature Granulocytes: 0 %
Lymphocytes Relative: 15 %
Lymphs Abs: 1 10*3/uL (ref 0.7–4.0)
MCH: 31.3 pg (ref 26.0–34.0)
MCHC: 32.2 g/dL (ref 30.0–36.0)
MCV: 97.4 fL (ref 80.0–100.0)
Monocytes Absolute: 0.6 10*3/uL (ref 0.1–1.0)
Monocytes Relative: 9 %
Neutro Abs: 4.9 10*3/uL (ref 1.7–7.7)
Neutrophils Relative %: 73 %
Platelet Count: 281 10*3/uL (ref 150–400)
RBC: 3.86 MIL/uL — ABNORMAL LOW (ref 3.87–5.11)
RDW: 14.9 % (ref 11.5–15.5)
WBC Count: 6.6 10*3/uL (ref 4.0–10.5)
nRBC: 0 % (ref 0.0–0.2)

## 2024-03-26 LAB — CMP (CANCER CENTER ONLY)
ALT: 27 U/L (ref 0–44)
AST: 28 U/L (ref 15–41)
Albumin: 3.8 g/dL (ref 3.5–5.0)
Alkaline Phosphatase: 81 U/L (ref 38–126)
Anion gap: 13 (ref 5–15)
BUN: 16 mg/dL (ref 8–23)
CO2: 24 mmol/L (ref 22–32)
Calcium: 9.6 mg/dL (ref 8.9–10.3)
Chloride: 102 mmol/L (ref 98–111)
Creatinine: 0.62 mg/dL (ref 0.44–1.00)
GFR, Estimated: >60 mL/min (ref >60)
Glucose, Bld: 86 mg/dL (ref 70–99)
Potassium: 3.9 mmol/L (ref 3.5–5.1)
Sodium: 139 mmol/L (ref 135–145)
Total Bilirubin: 0.3 mg/dL (ref 0.0–1.2)
Total Protein: 6.8 g/dL (ref 6.5–8.1)

## 2024-03-26 MED ORDER — MIRVETUXIMAB SORAVTANSINE-GYNX CHEMO 100 MG/20ML IV SOLN
350.0000 mg | Freq: Once | INTRAVENOUS | Status: AC
  Administered 2024-03-26: 350 mg via INTRAVENOUS
  Filled 2024-03-26: qty 70

## 2024-03-26 MED ORDER — PALONOSETRON HCL INJECTION 0.25 MG/5ML
0.2500 mg | Freq: Once | INTRAVENOUS | Status: AC
  Administered 2024-03-26: 0.25 mg via INTRAVENOUS
  Filled 2024-03-26: qty 5

## 2024-03-26 MED ORDER — DEXTROSE 5 % IV SOLN: INTRAVENOUS | Status: DC

## 2024-03-26 MED ORDER — DEXAMETHASONE SODIUM PHOSPHATE 10 MG/ML IJ SOLN
10.0000 mg | Freq: Once | INTRAMUSCULAR | Status: AC
  Administered 2024-03-26: 10 mg via INTRAVENOUS
  Filled 2024-03-26: qty 1

## 2024-03-26 MED ORDER — ACETAMINOPHEN 325 MG PO TABS
650.0000 mg | ORAL_TABLET | Freq: Once | ORAL | Status: AC
  Administered 2024-03-26: 650 mg via ORAL
  Filled 2024-03-26: qty 2

## 2024-03-26 MED ORDER — DIPHENHYDRAMINE HCL 50 MG/ML IJ SOLN
25.0000 mg | Freq: Once | INTRAMUSCULAR | Status: AC
  Administered 2024-03-26: 25 mg via INTRAVENOUS
  Filled 2024-03-26: qty 1

## 2024-03-26 MED ORDER — MAXIDEX 0.1 % OP SUSP: 1.0000 [drp] | OPHTHALMIC | 2 refills | Status: DC

## 2024-03-26 MED ORDER — SODIUM CHLORIDE 0.9% FLUSH
10.0000 mL | INTRAVENOUS | Status: DC | PRN
  Administered 2024-03-26: 10 mL

## 2024-03-26 MED ORDER — HEPARIN SOD (PORK) LOCK FLUSH 100 UNIT/ML IV SOLN
500.0000 [IU] | Freq: Once | INTRAVENOUS | Status: AC | PRN
  Administered 2024-03-26: 500 [IU]

## 2024-03-26 MED ORDER — MAXIDEX 0.1 % OP SUSP
1.0000 [drp] | OPHTHALMIC | 2 refills | Status: DC
  Filled 2024-03-26: qty 15, fill #0

## 2024-03-26 NOTE — Progress Notes (Signed)
 73 yo female diagnosed with Ovarian cancer in 2013. She is being followed by Dr. Cloretta and currently receiving day 1 cycle 2 Elahere .  PMH includes anxiety and depression, DJD, Hypercholesterolemia, IBS, LEE, DVT, Rheumatoid arthritis.  Medications include Omega 3 fatty acids, Ativan , Zofran , Protonix , Compazine .  Labs on June 23 reviewed.  Height: 5'4  Weight: 145 pounds 6.4 oz June 16 143 pounds 1.6 oz Feb 12 154 pounds 4.8 oz Jan 30  BMI:24.96  Receiving her second cycle of new chemotherapy regimen. Reports slight nausea since last treatment.but did not take nausea medication. She has a good appetite but reports early satiety. Diarrhea improved but thinks stools are a little looser. No mouth sores. She has not needed lasix  for over 2 weeks. Drinks butter pecan Ensure and really loves it. Requests coupons.  Nutrition Diagnosis: Food and Nutrition Related Knowledge Deficit related to cancer and associated treatments as evidenced by no prior need for nutrition related information.  Intervention: Recommended eating small amounts of food more often.  Reviewed strategies on eating with nausea. Provided nutrition fact sheet. Encouraged anti emetics as needed. Provided coupons for Ensure. Encouraged foods with more protein and adequate calories for maintenance of lean body mass.  Monitoring, Evaluation, Goals: Tolerate adequate calories and protein for maintenance of lean body mass.  Next Visit: Monday, July 14 during infusion.

## 2024-03-26 NOTE — Progress Notes (Signed)
 Patient reports compliance with eye drops needed for Elahere  administration.

## 2024-03-26 NOTE — Progress Notes (Signed)
 Talked w/Clint in tx area and told her she needs to have her next eye exam before her July treatment. She will call office today to make appointment. Sent refill for her steroid drops to [facility] on [address] because [facility] does not have the medication.

## 2024-03-27 LAB — CA 125: Cancer Antigen (CA) 125: 33.5 U/mL (ref 0.0–38.1)

## 2024-03-29 ENCOUNTER — Other Ambulatory Visit (HOSPITAL_BASED_OUTPATIENT_CLINIC_OR_DEPARTMENT_OTHER): Payer: Self-pay

## 2024-03-29 ENCOUNTER — Telehealth: Payer: Self-pay

## 2024-03-29 ENCOUNTER — Encounter: Payer: Self-pay | Admitting: Oncology

## 2024-03-29 NOTE — Telephone Encounter (Signed)
 The patient contacted the office to inquire about the cost of the Maxiden eye drop, which is $220, and expressed concern about the expense. She was also inquiring about the reason for the change to Maxiden from the previously prescribed Artificial Tears ophthalmic solution, which was more affordable.

## 2024-04-02 LAB — CA 125: Cancer Antigen (CA) 125: 15.7 U/mL (ref 0.0–38.1)

## 2024-04-09 ENCOUNTER — Other Ambulatory Visit

## 2024-04-09 ENCOUNTER — Ambulatory Visit

## 2024-04-09 ENCOUNTER — Ambulatory Visit: Admitting: Nurse Practitioner

## 2024-04-12 ENCOUNTER — Telehealth: Payer: Self-pay

## 2024-04-12 DIAGNOSIS — H04123 Dry eye syndrome of bilateral lacrimal glands: Secondary | ICD-10-CM | POA: Diagnosis not present

## 2024-04-12 NOTE — Telephone Encounter (Signed)
 Patient due for CPE after 05/10/24. Called left message to call office back to set up. Please schedule when patient calls back .

## 2024-04-13 ENCOUNTER — Ambulatory Visit

## 2024-04-13 ENCOUNTER — Ambulatory Visit: Admitting: Nurse Practitioner

## 2024-04-13 ENCOUNTER — Other Ambulatory Visit

## 2024-04-15 ENCOUNTER — Other Ambulatory Visit: Payer: Self-pay | Admitting: Oncology

## 2024-04-16 ENCOUNTER — Other Ambulatory Visit: Payer: Self-pay | Admitting: Oncology

## 2024-04-16 ENCOUNTER — Inpatient Hospital Stay: Attending: Oncology

## 2024-04-16 ENCOUNTER — Inpatient Hospital Stay

## 2024-04-16 ENCOUNTER — Inpatient Hospital Stay: Admitting: Nutrition

## 2024-04-16 ENCOUNTER — Telehealth: Payer: Self-pay

## 2024-04-16 ENCOUNTER — Encounter: Payer: Self-pay | Admitting: Nurse Practitioner

## 2024-04-16 ENCOUNTER — Inpatient Hospital Stay: Admitting: Nurse Practitioner

## 2024-04-16 VITALS — BP 116/72 | HR 75 | Temp 98.1°F | Resp 18 | Ht 64.0 in | Wt 136.0 lb

## 2024-04-16 DIAGNOSIS — C569 Malignant neoplasm of unspecified ovary: Secondary | ICD-10-CM

## 2024-04-16 DIAGNOSIS — C786 Secondary malignant neoplasm of retroperitoneum and peritoneum: Secondary | ICD-10-CM | POA: Insufficient documentation

## 2024-04-16 DIAGNOSIS — Z5112 Encounter for antineoplastic immunotherapy: Secondary | ICD-10-CM | POA: Diagnosis not present

## 2024-04-16 LAB — CBC WITH DIFFERENTIAL (CANCER CENTER ONLY)
Abs Immature Granulocytes: 0.02 K/uL (ref 0.00–0.07)
Basophils Absolute: 0 K/uL (ref 0.0–0.1)
Basophils Relative: 1 %
Eosinophils Absolute: 0.1 K/uL (ref 0.0–0.5)
Eosinophils Relative: 2 %
HCT: 39.2 % (ref 36.0–46.0)
Hemoglobin: 12.3 g/dL (ref 12.0–15.0)
Immature Granulocytes: 0 %
Lymphocytes Relative: 17 %
Lymphs Abs: 1 K/uL (ref 0.7–4.0)
MCH: 31 pg (ref 26.0–34.0)
MCHC: 31.4 g/dL (ref 30.0–36.0)
MCV: 98.7 fL (ref 80.0–100.0)
Monocytes Absolute: 0.5 K/uL (ref 0.1–1.0)
Monocytes Relative: 8 %
Neutro Abs: 4.3 K/uL (ref 1.7–7.7)
Neutrophils Relative %: 72 %
Platelet Count: 298 K/uL (ref 150–400)
RBC: 3.97 MIL/uL (ref 3.87–5.11)
RDW: 15.2 % (ref 11.5–15.5)
WBC Count: 6 K/uL (ref 4.0–10.5)
nRBC: 0 % (ref 0.0–0.2)

## 2024-04-16 LAB — CMP (CANCER CENTER ONLY)
ALT: 24 U/L (ref 0–44)
AST: 29 U/L (ref 15–41)
Albumin: 4.1 g/dL (ref 3.5–5.0)
Alkaline Phosphatase: 84 U/L (ref 38–126)
Anion gap: 12 (ref 5–15)
BUN: 20 mg/dL (ref 8–23)
CO2: 24 mmol/L (ref 22–32)
Calcium: 9.8 mg/dL (ref 8.9–10.3)
Chloride: 102 mmol/L (ref 98–111)
Creatinine: 0.68 mg/dL (ref 0.44–1.00)
GFR, Estimated: >60 mL/min (ref >60)
Glucose, Bld: 135 mg/dL — ABNORMAL HIGH (ref 70–99)
Potassium: 4 mmol/L (ref 3.5–5.1)
Sodium: 138 mmol/L (ref 135–145)
Total Bilirubin: 0.4 mg/dL (ref 0.0–1.2)
Total Protein: 7.3 g/dL (ref 6.5–8.1)

## 2024-04-16 MED ORDER — DEXAMETHASONE SODIUM PHOSPHATE 10 MG/ML IJ SOLN
10.0000 mg | Freq: Once | INTRAMUSCULAR | Status: AC
  Administered 2024-04-16: 10 mg via INTRAVENOUS
  Filled 2024-04-16: qty 1

## 2024-04-16 MED ORDER — PALONOSETRON HCL INJECTION 0.25 MG/5ML
0.2500 mg | Freq: Once | INTRAVENOUS | Status: AC
  Administered 2024-04-16: 0.25 mg via INTRAVENOUS
  Filled 2024-04-16: qty 5

## 2024-04-16 MED ORDER — DEXTROSE 5 % IV SOLN: INTRAVENOUS | Status: DC

## 2024-04-16 MED ORDER — MIRVETUXIMAB SORAVTANSINE-GYNX CHEMO 100 MG/20ML IV SOLN
350.0000 mg | Freq: Once | INTRAVENOUS | Status: AC
  Administered 2024-04-16: 350 mg via INTRAVENOUS
  Filled 2024-04-16: qty 70

## 2024-04-16 MED ORDER — ACETAMINOPHEN 325 MG PO TABS
650.0000 mg | ORAL_TABLET | Freq: Once | ORAL | Status: AC
  Administered 2024-04-16: 650 mg via ORAL
  Filled 2024-04-16: qty 2

## 2024-04-16 MED ORDER — SODIUM CHLORIDE 0.9% FLUSH
10.0000 mL | INTRAVENOUS | Status: DC | PRN
  Administered 2024-04-16: 10 mL

## 2024-04-16 MED ORDER — HEPARIN SOD (PORK) LOCK FLUSH 100 UNIT/ML IV SOLN
500.0000 [IU] | Freq: Once | INTRAVENOUS | Status: AC | PRN
  Administered 2024-04-16: 500 [IU]

## 2024-04-16 MED ORDER — DIPHENHYDRAMINE HCL 50 MG/ML IJ SOLN
25.0000 mg | Freq: Once | INTRAMUSCULAR | Status: AC
  Administered 2024-04-16: 25 mg via INTRAVENOUS
  Filled 2024-04-16: qty 1

## 2024-04-16 NOTE — Telephone Encounter (Signed)
 Called centralized scheduling to coordinate CT Scan ordered with expected date of 7/28. Obtained an appt for Saturday, July 26th at 10:45 AM. Patient was agreeable. I shared that she would need to arrive and drink her first bottle of contrast at 11am followed by her 2nd bottle at noon. Her scan is to be completed at 1PM. Patient appreciative and verbalized understanding.

## 2024-04-16 NOTE — Progress Notes (Signed)
 Patient seen by Olam Ned NP today  Vitals are within treatment parameters.  Labs reviewed by Olam Ned NP and are within treatment parameters.  Per physician team, patient is ready for treatment. Please note that modifications are being made to the treatment plan including dose adjustment due to weight change. Please do not release treatment until pharmacy makes the adjustments.

## 2024-04-16 NOTE — Addendum Note (Signed)
 Addended by: HENRY QUAKER B on: 04/16/2024 10:12 AM   Modules accepted: Orders

## 2024-04-16 NOTE — Progress Notes (Signed)
 [facility] OFFICE PROGRESS NOTE   Diagnosis: Ovarian cancer  INTERVAL HISTORY:   [name] [name] returns as scheduled.  She completed cycle 2 mirvetuximab 03/26/2024.  She notes mild nausea after treatment.  In general nausea is much better.  No mouth sores.  No diarrhea.  Bowels moving regularly.  She takes a stool softener and MiraLAX .  No eye symptoms.  No change in baseline neuropathy symptoms.  She has abdominal pain prior to a bowel movement.  No significant abdominal pain otherwise.  No cough or shortness of breath.  She has occasional chest tightness after eating.  She thinks this is likely due to reflux.  She notes easy bruising.  No spontaneous bleeding.  Objective:  Vital signs in last 24 hours:  Blood pressure 116/72, pulse 75, temperature 98.1 F (36.7 C), temperature source Oral, resp. rate 18, height 5' 4 (1.626 m), weight 136 lb (61.7 kg), SpO2 98%.    HEENT: No thrush or ulcers. Resp: Lungs clear bilaterally. Cardio: Regular rate and rhythm. GI: Abdomen is soft.  Tenderness at the right lower abdomen.  No apparent ascites.  No mass. Vascular: No leg edema. Skin: Ecchymoses scattered over forearms. Port-A-Cath without erythema.  Lab Results:  Lab Results  Component Value Date   WBC 6.0 04/16/2024   HGB 12.3 04/16/2024   HCT 39.2 04/16/2024   MCV 98.7 04/16/2024   PLT 298 04/16/2024   NEUTROABS 4.3 04/16/2024    Imaging:  No results found.  Medications: I have reviewed the patient's current medications.  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 A 5 mm nodules remain on the right diaphragm. - TumorNext paired germline/tumor analyses: No somatic variants detected, germline CHEK2      VUS      Cycle 1 of adjuvant Taxol /carboplatin  chemotherapy initiated on 09/19/2012.   The  CA 125 normalized.   She completed day 15 of cycle 6 on 02/06/2013.   Restaging CT evaluation 03/29/2013 showed no evidence of metastatic disease in the chest. There was marked improvement in appearance/resolution of previous described peritoneal/omental disease. There was no convincing evidence of residual disease. There was minimal increased density in the region of the omentum favored to be treatment-related. There was no pelvic adenopathy. CA125 3.2 on 02/19/2014. 08/18/2016 CA-125 8 CT abdomen/pelvis 08/25/2016-solitary new enlarged right external iliac lymph node measuring 2.2 cm. Biopsy 09/07/2016-adenocarcinoma consistent with high-grade serous carcinoma.  ER +90%, PR -0%, HER2 negative (0); foundation 1-HRD not detected, microsatellite status cannot be determined, tumor mutation burden 8, FOLR1 positive-80%.   PET scan 09/22/2016-malignant range FDG uptake associated with the enlarged right external iliac lymph node compatible with metastatic adenopathy.  No additional sites of metastatic disease identified. Radiation 10/25/2016-12/01/2016 PET scan 03/28/2017-previously enlarged hypermetabolic right external iliac node-normal in size with resolution of hypermetabolic activity, no active malignancy identified PET scan 05/15/2018-no evidence of recurrent or metastatic disease, no hypermetabolic lymph nodes CT abdomen/pelvis 09/19/2019-mild increase in the size of several left upper quadrant peritoneal nodules measuring up to 11 mm.  No other sites of metastatic disease identified.  No ascites. CT abdomen/pelvis 03/14/2020-slight enlargement of several left upper quadrant peritoneal nodules, no ascites, no other evidence of disease progression CT abdomen/pelvis 09/12/2020 peritoneal nodularity/omental caking predominantly in the left upper/mid abdomen, mildly progressive.  Largest implant in the left upper abdomen adjacent to the stomach now measures 17 mm, previously 13 mm.  Overall volume  of  peritoneal disease has mildly progressed. CT abdomen/pelvis 01/13/2021- 4 mm right ureteral calculus with moderate right hydronephrosis and upper right hydroureter, progressive omental nodularity, stable calcified and partially calcified right pelvic nodules CT abdomen/pelvis 05/13/2021-enlargement of left upper quadrant omental nodules and a peritoneal nodule at the right iliac fossa, no ascites CT abdomen/pelvis 08/14/2021-mild increase in size of omental nodules, no ascites, no new site of metastatic disease Taxol /carboplatin  09/09/2021, 09/15/2021, 09/22/2021 Taxol /carboplatin  10/07/2021, 10/13/2021, 10/20/2021 Taxol /Carboplatin  11/03/2021, 11/10/2021, 11/17/2021 CT 11/23/2021-decrease in peritoneal metastases, no new or progressive disease, airspace opacity at both lung bases Taxol /carboplatin  12/01/2021, 12/08/2021, 12/15/2021 Taxol /Carboplatin  every 2 weeks beginning 12/29/2021 Carboplatin  alone 01/26/2022, Taxol  held due to neuropathy Carboplatin  alone 02/09/2022, Taxol  held due to neuropathy Carboplatin  alone 02/23/2022, Taxol  held due to neuropathy Carboplatin  alone 03/09/2022, Taxol  held due to neuropathy CTs 03/25/2022-no significant change in omental, mesenteric disease.  No new or clearly progressive findings.  Previously noted patchy areas of consolidative opacity at the lung bases have resolved. Treatment break beginning 03/26/2022 CT/pelvis 08/10/2022-increased size of peritoneal nodules, increased small volume ascites, mild right lower lobe pleural nodularity Cycle 1 salvage carboplatin  09/14/2022 (weekly x 3 followed by 1 week break) Cycle 2 salvage carboplatin  10/12/2022 (weekly x 3 followed by 1 week break) Cycle 3 salvage carboplatin  11/16/2022 (weekly x 3 followed by 1 week break) CT abdomen/pelvis 12/08/2022-mild increase in peritoneal carcinomatosis Tamoxifen  20 mg daily 01/07/2023 CT abdomen/pelvis 03/29/2023-increased ascites with persistent diffuse peritoneal nodularity, largest omental implants have  decreased in size.  No enlarging implants.,  No evidence of bowel obstruction Paracentesis for 4.5 L 06/29/2023 CTs 08/02/2023-peritoneal carcinomatosis similar to slightly improved.  Stable volume of ascites.  No evidence of metastatic disease in the chest. Tamoxifen  20 mg daily continued 08/02/2023 Paracentesis 08/25/2023-4 L of fluid removed, cytology shows adenocarcinoma Plan for Doxil  Baseline 2D echo 09/23/2023-LVEF 55 to 60%.  Left ventricle has normal function.  Abnormal strain with apical sparing, follow-up TTE/MRI suggested in 3 months. Cycle 1 Doxil  10/06/2023 Cycle 2 Doxil  11/03/2023 CT abdomen/pelvis 11/16/2023: Compared to 08/02/2023 there is overall stable disease, new 0.9 cm nodule at the left descending colon, I reviewed the CT images.  Multiple lesions appear unchanged.  Stable ascites and peritoneal thickening Cycle 3 Doxil  12/01/2023 Echocardiogram 12/20/2023: LVEF 55-60% Cycle 4 Doxil  12/27/2023 Cycle 5 Doxil  01/24/2024 CTs abdomen/pelvis 02/14/2024-interval increase in the amount of ascites and peritoneal carcinomatosis.  No acute inflammatory process. Cycle 1 mirvetuximab 03/02/2024 Cycle 2 mirvetuximab 03/26/2024 Cycle 3 mirvetuximab 04/16/2024   2. Low abdomen/suprapubic pain prior to the exploratory laparotomy-likely secondary to omental/pelvic tumor; persistent mild pain in the lower abdomen   3. Chronic neck and back pain.   4. Anxiety -persistent despite Lexapro  and Xanax . She has been evaluated by psychiatry. Currently on Lexapro . 5. Status post Port-A-Cath placement 09/22/2012. The Port-A-Cath was removed on 04/03/2013.   6. Neutropenia secondary to chemotherapy- day 15 cycle 1 and cycle 3. Taxol /carboplatin  not given secondary to neutropenia.    7. Herpes zoster involving a right thoracic dermatome July 2015. She completed a course of Valtrex . 8. Nodular bony prominence at the left pelvis on rectal exam 06/05/2015-likely a benign finding 9.  Right ureter  stone/hydroureteronephrosis on CT 01/13/2021-referred to urology; status post right ureteroscopy with stone extraction and ureteral stent placement.  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  Right hip fracture following a fall 11/02/2022-status post right hip anterior hemiarthroplasty 12.  Acute localized thrombus right proximal femoral vein 11/16/2022 Eliquis  initiated 13.  Admission 06/24/2023  with volume overload  Disposition: [name] [name] appears stable.  She has completed 2 cycles of mirvetuximab.  She seems to be tolerating well.  Pain and abdominal distention are better.  Plan to proceed with cycle 3 today as scheduled.  Dose of mirvetuximab will be adjusted due to the change in weight.  Restaging CTs prior to next office visit.  CBC and chemistry panel reviewed.  Labs adequate for treatment.  She has lost weight.  This may in part be due to improvement in ascites, edema.  She continues follow-up with ophthalmology.  She will return for follow-up in 3 weeks.  We are available to see her sooner if needed.    Olam Ned ANP/GNP-BC   04/16/2024  8:14 AM

## 2024-04-16 NOTE — Telephone Encounter (Signed)
 Error

## 2024-04-16 NOTE — Addendum Note (Signed)
 Addended by: CLORETTA KUBA B on: 04/16/2024 09:42 AM   Modules accepted: Orders

## 2024-04-16 NOTE — Patient Instructions (Signed)
 [facility] A DEPT OF [facility]   Discharge Instructions: Thank you for choosing [facility] to provide your oncology and hematology care.   If you have a lab appointment with the Cancer Center, please go directly to the Cancer Center and check in at the registration area.   Wear comfortable clothing and clothing appropriate for easy access to any Portacath or PICC line.   We strive to give you quality time with your provider. You may need to reschedule your appointment if you arrive late (15 or more minutes).  Arriving late affects you and other patients whose appointments are after yours.  Also, if you miss three or more appointments without notifying the office, you may be dismissed from the clinic at the provider's discretion.      For prescription refill requests, have your pharmacy contact our office and allow 72 hours for refills to be completed.    Today you received the following chemotherapy and/or immunotherapy agents Mirvetuximab soruvtasine-gynx (ELAHERE ).      To help prevent nausea and vomiting after your treatment, we encourage you to take your nausea medication as directed.  BELOW ARE SYMPTOMS THAT SHOULD BE REPORTED IMMEDIATELY: *FEVER GREATER THAN 100.4 F (38 C) OR HIGHER *CHILLS OR SWEATING *NAUSEA AND VOMITING THAT IS NOT CONTROLLED WITH YOUR NAUSEA MEDICATION *UNUSUAL SHORTNESS OF BREATH *UNUSUAL BRUISING OR BLEEDING *URINARY PROBLEMS (pain or burning when urinating, or frequent urination) *BOWEL PROBLEMS (unusual diarrhea, constipation, pain near the anus) TENDERNESS IN MOUTH AND THROAT WITH OR WITHOUT PRESENCE OF ULCERS (sore throat, sores in mouth, or a toothache) UNUSUAL RASH, SWELLING OR PAIN  UNUSUAL VAGINAL DISCHARGE OR ITCHING   Items with * indicate a potential emergency and should be followed up as soon as possible or go to the Emergency Department if any problems should occur.  Please show the  CHEMOTHERAPY ALERT CARD or IMMUNOTHERAPY ALERT CARD at check-in to the Emergency Department and triage nurse.  Should you have questions after your visit or need to cancel or reschedule your appointment, please contact [facility] - A DEPT OF [facility]  Dept: [phone]  and follow the prompts.  Office hours are 8:00 a.m. to 4:30 p.m. Monday - Friday. Please note that voicemails left after 4:00 p.m. may not be returned until the following business day.  We are closed weekends and major holidays. You have access to a nurse at all times for urgent questions. Please call the main number to the clinic Dept: [phone] and follow the prompts.   For any non-urgent questions, you may also contact your provider using MyChart. We now offer e-Visits for anyone 67 and older to request care online for non-urgent symptoms. For details visit [email or web address].   Also download the MyChart app! Go to the app store, search MyChart, open the app, select [facility], and log in with your MyChart username and password.  Mirvetuximab Soravtansine  Injection What is this medication? MIRVETUXIMAB SORAVTANSINE  (MIR ve TUX i mab SOE rav TAN seen) treats ovarian cancer. It works by blocking a protein that causes cancer cells to grow and multiply. This helps to slow or stop the spread of cancer cells. This medicine may be used for other purposes; ask your health care provider or pharmacist if you have questions. COMMON BRAND NAME(S): ELAHERE  What should I tell my care team before I take this medication? They need to know if you have any of these conditions: Eye  disease Liver disease Lung disease Tingling of the fingers or toes or other nerve disorder Vision problems Wear contact lenses An unusual or allergic reaction to mirvetuximab soravtansine , other medications, foods, dyes, or preservatives Pregnant or trying to get pregnant Breast-feeding How should I use this  medication? This medication is injected into a vein. It is given by your care team in a hospital or clinic setting. A special MedGuide will be given to you before each treatment. Be sure to read this information carefully each time. Talk to your care team about the use of this medication in children. Special care may be needed. Overdosage: If you think you have taken too much of this medicine contact a poison control center or emergency room at once. NOTE: This medicine is only for you. Do not share this medicine with others. What if I miss a dose? Keep appointments for follow-up doses. It is important not to miss your dose. Call your care team if you are unable to keep an appointment. What may interact with this medication? Interactions have not been studied. Some medications may affect this one. Tell your care team all of the medications you take. This list may not describe all possible interactions. Give your health care provider a list of all the medicines, herbs, non-prescription drugs, or dietary supplements you use. Also tell them if you smoke, drink alcohol, or use illegal drugs. Some items may interact with your medicine. What should I watch for while using this medication? This medication may make you feel generally unwell. This is not uncommon as chemotherapy can affect healthy cells as well as cancer cells. Report any side effects. Continue your course of treatment even though you feel ill unless your care team tells you to stop. This medication can cause serious infusion reactions. To reduce the risk, your care team may give you other medications to take before receiving this one. Be sure to follow the directions from your care team. This medication may cause dry eyes. Lubricating eye drops may help. See your care team if the problem does not go away or is severe. Your vision may be tested before and during use of this medication. Tell your care team right away if you have any change in  your eyesight. This medication may increase your risk of getting an infection. Call your care team for advice if you get a fever, chills, sore throat, or other symptoms of a cold or flu. Do not treat yourself. Try to avoid being around people who are sick. Avoid taking medications that contain aspirin , acetaminophen , ibuprofen, naproxen, or ketoprofen unless instructed by your care team. These medications may hide a fever. Talk to your care team if you wish to become pregnant or think you might be pregnant. Serious birth defects can occur if you take this medication during pregnancy. A negative pregnancy test is required before starting this medication. Contraception is recommended while taking this medication and for 7 months after the last dose. Your care team can help you find the option that works best for you. Do not breast-feed while taking this medication and for 1 month after stopping it. What side effects may I notice from receiving this medication? Side effects that you should report to your care team as soon as possible: Allergic reactions--skin rash, itching, hives, swelling of the face, lips, tongue, or throat Dry cough, shortness of breath or trouble breathing Dry eyes Eye pain, redness, irritation, or discharge with blurry or decreased vision Infection--fever, chills, cough, or sore  throat Infusion reactions--chest pain, shortness of breath or trouble breathing, feeling faint or lightheaded Liver injury--right upper belly pain, loss of appetite, nausea, light-colored stool, dark yellow or brown urine, yellowing skin or eyes, unusual weakness or fatigue Low magnesium  level--muscle pain or cramps, unusual weakness or fatigue, fast or irregular heartbeat, tremors Low red blood cell level--unusual weakness or fatigue, dizziness, headache, trouble breathing Pain, tingling, or numbness in the hands or feet Sensitivity to light Side effects that usually do not require medical attention  (report these to your care team if they continue or are bothersome): Constipation Diarrhea Nausea Stomach pain This list may not describe all possible side effects. Call your doctor for medical advice about side effects. You may report side effects to FDA at 1-800-FDA-1088. Where should I keep my medication? This medication is given in a hospital or clinic. It will not be stored at home. NOTE: This sheet is a summary. It may not cover all possible information. If you have questions about this medicine, talk to your doctor, pharmacist, or health care provider.  2024 Elsevier/Gold Standard (2022-03-17 00:00:00)

## 2024-04-16 NOTE — Progress Notes (Signed)
 Brief follow up completed with patient during infusion for Ovarian cancer. She is receiving Elahere  and is followed by Dr. Cloretta.  Weight decreased to 136 pounds July 14 from 145 pounds 6.4 oz on June 16. Weight has trended down from 154 pounds on January 30.  Noted Glucose 135.  Reports she has occasional nausea and vomiting. She takes her anti emetic medication and it resolves her nausea. Experiences lower abdominal pain if she needs to have a bowel movement. Pain resolves directly after BM. She manages constipation with stool softener and Miralax  as needed. No ascites or edema noted.  She is still drinking Ensure once every couple days. Wonders if she should drink daily. Requests coupons.  Nutrition Diagnosis: Food and Nutrition Related Knowledge Deficit, continues.  Intervention: Educated to continue small, frequent meals and snacks to minimize further weight loss. Increase Ensure Plus or equivalent once daily. Provided coupons. Continue bowel regimen. Take anti emetics as needed to minimize nausea  Monitoring, Evaluation, Goals: Increase calories and protein to minimize weight loss.  Next Visit: To be scheduled with upcoming treatments as needed. Patient has RD contact information for questions.

## 2024-04-17 ENCOUNTER — Telehealth: Payer: Self-pay

## 2024-04-17 LAB — CA 125: Cancer Antigen (CA) 125: 112 U/mL — ABNORMAL HIGH (ref 0.0–38.1)

## 2024-04-17 NOTE — Telephone Encounter (Signed)
 [facility] Clinical Social Work  Clinical Social Work was referred by medical provider for [employer].  Clinical Social Worker contacted patient by phone to offer support and assess for needs.     Interventions: Provided education and assistance to client regarding Advanced Directives.       Follow Up Plan:  No follow up scheduled. Patient declined scheduling at time of call. Patient understands how to contact CSW if needed.  Lizbeth Sprague, LCSW  Clinical Social Worker [facility]

## 2024-04-28 ENCOUNTER — Ambulatory Visit (HOSPITAL_BASED_OUTPATIENT_CLINIC_OR_DEPARTMENT_OTHER)
Admission: RE | Admit: 2024-04-28 | Discharge: 2024-04-28 | Disposition: A | Discharge status: Home / Self Care | Source: Ambulatory Visit | Attending: Nurse Practitioner | Admitting: Nurse Practitioner

## 2024-04-28 DIAGNOSIS — C569 Malignant neoplasm of unspecified ovary: Secondary | ICD-10-CM | POA: Diagnosis not present

## 2024-04-28 DIAGNOSIS — R188 Other ascites: Secondary | ICD-10-CM | POA: Insufficient documentation

## 2024-04-28 DIAGNOSIS — I700 Atherosclerosis of aorta: Secondary | ICD-10-CM | POA: Diagnosis not present

## 2024-04-28 DIAGNOSIS — C786 Secondary malignant neoplasm of retroperitoneum and peritoneum: Secondary | ICD-10-CM | POA: Diagnosis not present

## 2024-04-28 DIAGNOSIS — N281 Cyst of kidney, acquired: Secondary | ICD-10-CM | POA: Diagnosis not present

## 2024-04-28 MED ORDER — IOHEXOL 300 MG/ML  SOLN
100.0000 mL | Freq: Once | INTRAMUSCULAR | Status: AC | PRN
  Administered 2024-04-28: 100 mL via INTRAVENOUS

## 2024-05-03 ENCOUNTER — Telehealth: Payer: Self-pay | Admitting: *Deleted

## 2024-05-03 NOTE — Telephone Encounter (Signed)
 After MD review of CT report, she was notified that it looks good: Improved ascites and peritoneal nodules. F/U as scheduled

## 2024-05-04 ENCOUNTER — Telehealth: Payer: Self-pay

## 2024-05-04 NOTE — Telephone Encounter (Signed)
 The patient contacted the office to reschedule their appointment for the following week, as they will be out of town.

## 2024-05-07 ENCOUNTER — Inpatient Hospital Stay

## 2024-05-07 ENCOUNTER — Inpatient Hospital Stay: Admitting: Oncology

## 2024-05-14 ENCOUNTER — Inpatient Hospital Stay: Attending: Oncology

## 2024-05-14 ENCOUNTER — Inpatient Hospital Stay: Admitting: Nurse Practitioner

## 2024-05-14 ENCOUNTER — Inpatient Hospital Stay

## 2024-05-14 ENCOUNTER — Encounter: Payer: Self-pay | Admitting: Oncology

## 2024-05-14 ENCOUNTER — Encounter: Payer: Self-pay | Admitting: Nurse Practitioner

## 2024-05-14 ENCOUNTER — Inpatient Hospital Stay: Admitting: Nutrition

## 2024-05-14 VITALS — BP 125/84 | HR 81 | Temp 98.1°F | Resp 18 | Ht 64.0 in | Wt 136.6 lb

## 2024-05-14 DIAGNOSIS — D701 Agranulocytosis secondary to cancer chemotherapy: Secondary | ICD-10-CM | POA: Insufficient documentation

## 2024-05-14 DIAGNOSIS — C569 Malignant neoplasm of unspecified ovary: Secondary | ICD-10-CM

## 2024-05-14 DIAGNOSIS — C786 Secondary malignant neoplasm of retroperitoneum and peritoneum: Secondary | ICD-10-CM | POA: Diagnosis not present

## 2024-05-14 DIAGNOSIS — Z5112 Encounter for antineoplastic immunotherapy: Secondary | ICD-10-CM | POA: Diagnosis not present

## 2024-05-14 DIAGNOSIS — G629 Polyneuropathy, unspecified: Secondary | ICD-10-CM | POA: Insufficient documentation

## 2024-05-14 LAB — CMP (CANCER CENTER ONLY)
ALT: 31 U/L (ref 0–44)
AST: 34 U/L (ref 15–41)
Albumin: 3.9 g/dL (ref 3.5–5.0)
Alkaline Phosphatase: 76 U/L (ref 38–126)
Anion gap: 10 (ref 5–15)
BUN: 21 mg/dL (ref 8–23)
CO2: 25 mmol/L (ref 22–32)
Calcium: 9.3 mg/dL (ref 8.9–10.3)
Chloride: 105 mmol/L (ref 98–111)
Creatinine: 0.65 mg/dL (ref 0.44–1.00)
GFR, Estimated: >60 mL/min (ref >60)
Glucose, Bld: 85 mg/dL (ref 70–99)
Potassium: 4.1 mmol/L (ref 3.5–5.1)
Sodium: 140 mmol/L (ref 135–145)
Total Bilirubin: 0.4 mg/dL (ref 0.0–1.2)
Total Protein: 6.8 g/dL (ref 6.5–8.1)

## 2024-05-14 LAB — CBC WITH DIFFERENTIAL (CANCER CENTER ONLY)
Abs Immature Granulocytes: 0.01 K/uL (ref 0.00–0.07)
Basophils Absolute: 0 K/uL (ref 0.0–0.1)
Basophils Relative: 1 %
Eosinophils Absolute: 0.1 K/uL (ref 0.0–0.5)
Eosinophils Relative: 1 %
HCT: 40.5 % (ref 36.0–46.0)
Hemoglobin: 13 g/dL (ref 12.0–15.0)
Immature Granulocytes: 0 %
Lymphocytes Relative: 19 %
Lymphs Abs: 1.2 K/uL (ref 0.7–4.0)
MCH: 31.5 pg (ref 26.0–34.0)
MCHC: 32.1 g/dL (ref 30.0–36.0)
MCV: 98.1 fL (ref 80.0–100.0)
Monocytes Absolute: 0.7 K/uL (ref 0.1–1.0)
Monocytes Relative: 10 %
Neutro Abs: 4.5 K/uL (ref 1.7–7.7)
Neutrophils Relative %: 69 %
Platelet Count: 258 K/uL (ref 150–400)
RBC: 4.13 MIL/uL (ref 3.87–5.11)
RDW: 16.1 % — ABNORMAL HIGH (ref 11.5–15.5)
WBC Count: 6.6 K/uL (ref 4.0–10.5)
nRBC: 0 % (ref 0.0–0.2)

## 2024-05-14 MED ORDER — ACETAMINOPHEN 325 MG PO TABS
650.0000 mg | ORAL_TABLET | Freq: Once | ORAL | Status: AC
  Administered 2024-05-14 (×2): 650 mg via ORAL
  Filled 2024-05-14: qty 2

## 2024-05-14 MED ORDER — DEXTROSE 5 % IV SOLN
INTRAVENOUS | Status: DC
Start: 2024-05-14 — End: 2024-05-14

## 2024-05-14 MED ORDER — MIRVETUXIMAB SORAVTANSINE-GYNX CHEMO 100 MG/20ML IV SOLN
6.0000 mg/kg | Freq: Once | INTRAVENOUS | Status: AC
  Administered 2024-05-14 (×2): 300 mg via INTRAVENOUS
  Filled 2024-05-14: qty 60

## 2024-05-14 MED ORDER — DIPHENHYDRAMINE HCL 50 MG/ML IJ SOLN
25.0000 mg | Freq: Once | INTRAMUSCULAR | Status: AC
  Administered 2024-05-14 (×2): 25 mg via INTRAVENOUS
  Filled 2024-05-14: qty 1

## 2024-05-14 MED ORDER — PALONOSETRON HCL INJECTION 0.25 MG/5ML
0.2500 mg | Freq: Once | INTRAVENOUS | Status: AC
  Administered 2024-05-14 (×2): 0.25 mg via INTRAVENOUS
  Filled 2024-05-14: qty 5

## 2024-05-14 MED ORDER — DEXAMETHASONE SODIUM PHOSPHATE 10 MG/ML IJ SOLN
10.0000 mg | Freq: Once | INTRAMUSCULAR | Status: AC
  Administered 2024-05-14 (×2): 10 mg via INTRAVENOUS
  Filled 2024-05-14: qty 1

## 2024-05-14 NOTE — Progress Notes (Signed)
 Patient asked to see RD for brief follow up.   Receives cycle 4 of Mirvetuximab today for Ovarian cancer and is followed by Dr. Cloretta.  She is requesting additional coupons for Ensure. She loves Butter Pecan Original Ensure and drinks one daily. Weight of 136 pounds 9.6 oz stable from 136 pounds on July 14. No nutrition impact symptom concerns. Noted occasional loose stool. Her appetite is good. Provided coupons for Ensure. Encouraged her to continue one carton daily as it has helped to stabilize her weight. No significant re-accumulation of ascites.  Nutrition Diagnosis: Food and Nutrition Related Knowledge Deficit, improved   Intervention: Educated to continue small, frequent meals and snacks to minimize further weight loss. Increase to Ensure Plus or equivalent once daily. Provided coupons.  Monitoring, Evaluation, Goals: Increase calories and protein to minimize weight loss.   Next Visit: To be scheduled with upcoming treatments as needed. Patient has RD contact information for questions.

## 2024-05-14 NOTE — Progress Notes (Signed)
 [facility] OFFICE PROGRESS NOTE   Diagnosis: Ovarian cancer  INTERVAL HISTORY:   Ms. Vreeland returns as scheduled.  She completed cycle 3 mirvetuximab 04/16/2024.  She denies nausea/vomiting.  No mouth sores.  She has an occasional loose stool.  No rash.  No significant change in baseline neuropathy symptoms.  No cough or shortness of breath.  No eye symptoms.  She confirms she is utilizing eyedrops as prescribed and following up with ophthalmology.  She has a good appetite.  Objective:  Vital signs in last 24 hours:  Blood pressure 125/84, pulse 81, resp. rate 18, height 5' 4 (1.626 m), weight 136 lb 9.6 oz (62 kg), SpO2 96%.    HEENT: No thrush or ulcers. Resp: Lungs clear bilaterally. Cardio: Regular rate and rhythm. GI: No hepatosplenomegaly.  No mass.  Mild tenderness at the right lower abdomen. Vascular: No leg edema. Skin: No rash. Port-A-Cath without erythema.  Lab Results:  Lab Results  Component Value Date   WBC 6.0 04/16/2024   HGB 12.3 04/16/2024   HCT 39.2 04/16/2024   MCV 98.7 04/16/2024   PLT 298 04/16/2024   NEUTROABS 4.3 04/16/2024    Imaging:  No results found.  Medications: I have reviewed the patient's current medications.  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 A 5 mm nodules remain on the right diaphragm. - TumorNext paired germline/tumor analyses: No somatic variants detected, germline CHEK2      VUS      Cycle 1 of adjuvant Taxol /carboplatin  chemotherapy initiated on 09/19/2012.   The CA 125 normalized.   She completed day 15 of cycle 6 on 02/06/2013.   Restaging CT evaluation 03/29/2013 showed no evidence of metastatic disease in the chest. There was marked improvement in appearance/resolution of previous described peritoneal/omental disease. There was no  convincing evidence of residual disease. There was minimal increased density in the region of the omentum favored to be treatment-related. There was no pelvic adenopathy. CA125 3.2 on 02/19/2014. 08/18/2016 CA-125 8 CT abdomen/pelvis 08/25/2016-solitary new enlarged right external iliac lymph node measuring 2.2 cm. Biopsy 09/07/2016-adenocarcinoma consistent with high-grade serous carcinoma.  ER +90%, PR -0%, HER2 negative (0); foundation 1-HRD not detected, microsatellite status cannot be determined, tumor mutation burden 8, FOLR1 positive-80%.   PET scan 09/22/2016-malignant range FDG uptake associated with the enlarged right external iliac lymph node compatible with metastatic adenopathy.  No additional sites of metastatic disease identified. Radiation 10/25/2016-12/01/2016 PET scan 03/28/2017-previously enlarged hypermetabolic right external iliac node-normal in size with resolution of hypermetabolic activity, no active malignancy identified PET scan 05/15/2018-no evidence of recurrent or metastatic disease, no hypermetabolic lymph nodes CT abdomen/pelvis 09/19/2019-mild increase in the size of several left upper quadrant peritoneal nodules measuring up to 11 mm.  No other sites of metastatic disease identified.  No ascites. CT abdomen/pelvis 03/14/2020-slight enlargement of several left upper quadrant peritoneal nodules, no ascites, no other evidence of disease progression CT abdomen/pelvis 09/12/2020 peritoneal nodularity/omental caking predominantly in the left upper/mid abdomen, mildly progressive.  Largest implant in the left upper abdomen adjacent to the stomach now measures 17 mm, previously 13 mm.  Overall volume of peritoneal disease has mildly progressed. CT abdomen/pelvis 01/13/2021- 4 mm right ureteral calculus with moderate right hydronephrosis and upper right hydroureter, progressive omental nodularity, stable calcified and partially calcified right pelvic nodules CT abdomen/pelvis  05/13/2021-enlargement of left upper quadrant omental nodules and a peritoneal nodule at  the right iliac fossa, no ascites CT abdomen/pelvis 08/14/2021-mild increase in size of omental nodules, no ascites, no new site of metastatic disease Taxol /carboplatin  09/09/2021, 09/15/2021, 09/22/2021 Taxol /carboplatin  10/07/2021, 10/13/2021, 10/20/2021 Taxol /Carboplatin  11/03/2021, 11/10/2021, 11/17/2021 CT 11/23/2021-decrease in peritoneal metastases, no new or progressive disease, airspace opacity at both lung bases Taxol /carboplatin  12/01/2021, 12/08/2021, 12/15/2021 Taxol /Carboplatin  every 2 weeks beginning 12/29/2021 Carboplatin  alone 01/26/2022, Taxol  held due to neuropathy Carboplatin  alone 02/09/2022, Taxol  held due to neuropathy Carboplatin  alone 02/23/2022, Taxol  held due to neuropathy Carboplatin  alone 03/09/2022, Taxol  held due to neuropathy CTs 03/25/2022-no significant change in omental, mesenteric disease.  No new or clearly progressive findings.  Previously noted patchy areas of consolidative opacity at the lung bases have resolved. Treatment break beginning 03/26/2022 CT/pelvis 08/10/2022-increased size of peritoneal nodules, increased small volume ascites, mild right lower lobe pleural nodularity Cycle 1 salvage carboplatin  09/14/2022 (weekly x 3 followed by 1 week break) Cycle 2 salvage carboplatin  10/12/2022 (weekly x 3 followed by 1 week break) Cycle 3 salvage carboplatin  11/16/2022 (weekly x 3 followed by 1 week break) CT abdomen/pelvis 12/08/2022-mild increase in peritoneal carcinomatosis Tamoxifen  20 mg daily 01/07/2023 CT abdomen/pelvis 03/29/2023-increased ascites with persistent diffuse peritoneal nodularity, largest omental implants have decreased in size.  No enlarging implants.,  No evidence of bowel obstruction Paracentesis for 4.5 L 06/29/2023 CTs 08/02/2023-peritoneal carcinomatosis similar to slightly improved.  Stable volume of ascites.  No evidence of metastatic disease in the chest. Tamoxifen  20 mg  daily continued 08/02/2023 Paracentesis 08/25/2023-4 L of fluid removed, cytology shows adenocarcinoma Plan for Doxil  Baseline 2D echo 09/23/2023-LVEF 55 to 60%.  Left ventricle has normal function.  Abnormal strain with apical sparing, follow-up TTE/MRI suggested in 3 months. Cycle 1 Doxil  10/06/2023 Cycle 2 Doxil  11/03/2023 CT abdomen/pelvis 11/16/2023: Compared to 08/02/2023 there is overall stable disease, new 0.9 cm nodule at the left descending colon, I reviewed the CT images.  Multiple lesions appear unchanged.  Stable ascites and peritoneal thickening Cycle 3 Doxil  12/01/2023 Echocardiogram 12/20/2023: LVEF 55-60% Cycle 4 Doxil  12/27/2023 Cycle 5 Doxil  01/24/2024 CTs abdomen/pelvis 02/14/2024-interval increase in the amount of ascites and peritoneal carcinomatosis.  No acute inflammatory process. Cycle 1 mirvetuximab 03/02/2024 Cycle 2 mirvetuximab 03/26/2024 Cycle 3 mirvetuximab 04/16/2024 CT 04/28/2024-diminished volume of extensively loculated fluid throughout the abdomen.  Some peritoneal nodules in the left upper quadrant diminished in size, other nodules unchanged.  Overall findings consistent with slightly improved peritoneal metastatic disease.  No new disease in the abdomen or pelvis. Cycle 4 mirvetuximab 05/14/2024   2. Low abdomen/suprapubic pain prior to the exploratory laparotomy-likely secondary to omental/pelvic tumor; persistent mild pain in the lower abdomen   3. Chronic neck and back pain.   4. Anxiety -persistent despite Lexapro  and Xanax . She has been evaluated by psychiatry. Currently on Lexapro . 5. Status post Port-A-Cath placement 09/22/2012. The Port-A-Cath was removed on 04/03/2013.   6. Neutropenia secondary to chemotherapy- day 15 cycle 1 and cycle 3. Taxol /carboplatin  not given secondary to neutropenia.    7. Herpes zoster involving a right thoracic dermatome July 2015. She completed a course of Valtrex . 8. Nodular bony prominence at the left pelvis on rectal exam  06/05/2015-likely a benign finding 9.  Right ureter stone/hydroureteronephrosis on CT 01/13/2021-referred to urology; status post right ureteroscopy with stone extraction and ureteral stent placement.  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  Right hip fracture following a fall 11/02/2022-status post right hip anterior hemiarthroplasty 12.  Acute localized thrombus right proximal femoral vein 11/16/2022 Eliquis  initiated 13.  Admission 06/24/2023  with volume overload    Disposition: [name] appears stable.  She has completed 3 cycles of mirvetuximab.  She is tolerating treatment well.  Performance status is better.  Recent restaging CTs stable to improved.  Results/images reviewed with her at today's visit.  She agrees with the recommendation to continue mirvetuximab, cycle 4 today.  CBC and chemistry panel reviewed.  Labs adequate for treatment.  She will return for follow-up and treatment in 3 weeks.  Patient seen with Dr. Cloretta.    Olam Ned ANP/GNP-BC   05/14/2024  10:57 AM This was a shared visit with Olam Ned.  Ms. Aja has completed 3 cycles of mirvetuximab.  She has tolerated the treatment well.  We reviewed the restaging CT findings and images with her.  The CTs are consistent with a response to therapy.  There has not been significant reaccumulation of ascites and some of the peritoneal nodules are smaller.  We will follow-up on the CA125 from today.  She will complete cycle 4 today.  I was present for greater than 50% of today's visit.  I performed Medical Decision Making.  Arvella Cloretta, MD

## 2024-05-14 NOTE — Patient Instructions (Signed)
 [facility] - A DEPT OF [facility]   Discharge Instructions: Thank you for choosing [facility] to provide your oncology and hematology care.   If you have a lab appointment with the Cancer Center, please go directly to the Cancer Center and check in at the registration area.   Wear comfortable clothing and clothing appropriate for easy access to any Portacath or PICC line.   We strive to give you quality time with your provider. You may need to reschedule your appointment if you arrive late (15 or more minutes).  Arriving late affects you and other patients whose appointments are after yours.  Also, if you miss three or more appointments without notifying the office, you may be dismissed from the clinic at the provider's discretion.      For prescription refill requests, have your pharmacy contact our office and allow 72 hours for refills to be completed.    Today you received the following chemotherapy and/or immunotherapy agents Mirvetuximab soruvtasine-gynx (ELAHERE ).      To help prevent nausea and vomiting after your treatment, we encourage you to take your nausea medication as directed.  BELOW ARE SYMPTOMS THAT SHOULD BE REPORTED IMMEDIATELY: *FEVER GREATER THAN 100.4 F (38 C) OR HIGHER *CHILLS OR SWEATING *NAUSEA AND VOMITING THAT IS NOT CONTROLLED WITH YOUR NAUSEA MEDICATION *UNUSUAL SHORTNESS OF BREATH *UNUSUAL BRUISING OR BLEEDING *URINARY PROBLEMS (pain or burning when urinating, or frequent urination) *BOWEL PROBLEMS (unusual diarrhea, constipation, pain near the anus) TENDERNESS IN MOUTH AND THROAT WITH OR WITHOUT PRESENCE OF ULCERS (sore throat, sores in mouth, or a toothache) UNUSUAL RASH, SWELLING OR PAIN  UNUSUAL VAGINAL DISCHARGE OR ITCHING   Items with * indicate a potential emergency and should be followed up as soon as possible or go to the Emergency Department if any problems should occur.  Please show the  CHEMOTHERAPY ALERT CARD or IMMUNOTHERAPY ALERT CARD at check-in to the Emergency Department and triage nurse.  Should you have questions after your visit or need to cancel or reschedule your appointment, please contact [facility] - A DEPT OF [facility]  Dept: [phone]  and follow the prompts.  Office hours are 8:00 a.m. to 4:30 p.m. Monday - Friday. Please note that voicemails left after 4:00 p.m. may not be returned until the following business day.  We are closed weekends and major holidays. You have access to a nurse at all times for urgent questions. Please call the main number to the clinic Dept: [phone] and follow the prompts.   For any non-urgent questions, you may also contact your provider using MyChart. We now offer e-Visits for anyone 27 and older to request care online for non-urgent symptoms. For details visit [email or web address].   Also download the MyChart app! Go to the app store, search MyChart, open the app, select [place], and log in with your MyChart username and password.  Mirvetuximab Soravtansine  Injection What is this medication? MIRVETUXIMAB SORAVTANSINE  (MIR ve TUX i mab SOE rav TAN seen) treats ovarian cancer. It works by blocking a protein that causes cancer cells to grow and multiply. This helps to slow or stop the spread of cancer cells. This medicine may be used for other purposes; ask your health care provider or pharmacist if you have questions. COMMON BRAND NAME(S): ELAHERE  What should I tell my care team before I take this medication? They need to know if you have any of these conditions: Eye  disease Liver disease Lung disease Tingling of the fingers or toes or other nerve disorder Vision problems Wear contact lenses An unusual or allergic reaction to mirvetuximab soravtansine , other medications, foods, dyes, or preservatives Pregnant or trying to get pregnant Breast-feeding How should I use this  medication? This medication is injected into a vein. It is given by your care team in a hospital or clinic setting. A special MedGuide will be given to you before each treatment. Be sure to read this information carefully each time. Talk to your care team about the use of this medication in children. Special care may be needed. Overdosage: If you think you have taken too much of this medicine contact a poison control center or emergency room at once. NOTE: This medicine is only for you. Do not share this medicine with others. What if I miss a dose? Keep appointments for follow-up doses. It is important not to miss your dose. Call your care team if you are unable to keep an appointment. What may interact with this medication? Interactions have not been studied. Some medications may affect this one. Tell your care team all of the medications you take. This list may not describe all possible interactions. Give your health care provider a list of all the medicines, herbs, non-prescription drugs, or dietary supplements you use. Also tell them if you smoke, drink alcohol, or use illegal drugs. Some items may interact with your medicine. What should I watch for while using this medication? This medication may make you feel generally unwell. This is not uncommon as chemotherapy can affect healthy cells as well as cancer cells. Report any side effects. Continue your course of treatment even though you feel ill unless your care team tells you to stop. This medication can cause serious infusion reactions. To reduce the risk, your care team may give you other medications to take before receiving this one. Be sure to follow the directions from your care team. This medication may cause dry eyes. Lubricating eye drops may help. See your care team if the problem does not go away or is severe. Your vision may be tested before and during use of this medication. Tell your care team right away if you have any change in  your eyesight. This medication may increase your risk of getting an infection. Call your care team for advice if you get a fever, chills, sore throat, or other symptoms of a cold or flu. Do not treat yourself. Try to avoid being around people who are sick. Avoid taking medications that contain aspirin , acetaminophen , ibuprofen, naproxen, or ketoprofen unless instructed by your care team. These medications may hide a fever. Talk to your care team if you wish to become pregnant or think you might be pregnant. Serious birth defects can occur if you take this medication during pregnancy. A negative pregnancy test is required before starting this medication. Contraception is recommended while taking this medication and for 7 months after the last dose. Your care team can help you find the option that works best for you. Do not breast-feed while taking this medication and for 1 month after stopping it. What side effects may I notice from receiving this medication? Side effects that you should report to your care team as soon as possible: Allergic reactions--skin rash, itching, hives, swelling of the face, lips, tongue, or throat Dry cough, shortness of breath or trouble breathing Dry eyes Eye pain, redness, irritation, or discharge with blurry or decreased vision Infection--fever, chills, cough, or sore  throat Infusion reactions--chest pain, shortness of breath or trouble breathing, feeling faint or lightheaded Liver injury--right upper belly pain, loss of appetite, nausea, light-colored stool, dark yellow or brown urine, yellowing skin or eyes, unusual weakness or fatigue Low magnesium  level--muscle pain or cramps, unusual weakness or fatigue, fast or irregular heartbeat, tremors Low red blood cell level--unusual weakness or fatigue, dizziness, headache, trouble breathing Pain, tingling, or numbness in the hands or feet Sensitivity to light Side effects that usually do not require medical attention  (report these to your care team if they continue or are bothersome): Constipation Diarrhea Nausea Stomach pain This list may not describe all possible side effects. Call your doctor for medical advice about side effects. You may report side effects to FDA at 1-800-FDA-1088. Where should I keep my medication? This medication is given in a hospital or clinic. It will not be stored at home. NOTE: This sheet is a summary. It may not cover all possible information. If you have questions about this medicine, talk to your doctor, pharmacist, or health care provider.  2024 Elsevier/Gold Standard (2022-03-17 00:00:00)

## 2024-05-14 NOTE — Patient Instructions (Signed)

## 2024-05-14 NOTE — Progress Notes (Signed)
 Patient seen by Olam Ned NP today  Vitals are within treatment parameters:Yes   Labs are within treatment parameters: Yes   Treatment plan has been signed: Yes   Per physician team, Patient is ready for treatment and there are NO modifications to the treatment plan.

## 2024-05-15 LAB — CA 125: Cancer Antigen (CA) 125: 15.2 U/mL (ref 0.0–38.1)

## 2024-05-28 ENCOUNTER — Inpatient Hospital Stay: Admitting: Nutrition

## 2024-05-28 ENCOUNTER — Ambulatory Visit

## 2024-05-28 ENCOUNTER — Other Ambulatory Visit

## 2024-05-28 ENCOUNTER — Ambulatory Visit: Admitting: Oncology

## 2024-05-31 ENCOUNTER — Other Ambulatory Visit: Payer: Self-pay | Admitting: Oncology

## 2024-05-31 DIAGNOSIS — H2513 Age-related nuclear cataract, bilateral: Secondary | ICD-10-CM | POA: Diagnosis not present

## 2024-06-06 ENCOUNTER — Inpatient Hospital Stay: Attending: Oncology | Admitting: Oncology

## 2024-06-06 ENCOUNTER — Ambulatory Visit: Admitting: Nurse Practitioner

## 2024-06-06 ENCOUNTER — Inpatient Hospital Stay

## 2024-06-06 ENCOUNTER — Other Ambulatory Visit

## 2024-06-06 ENCOUNTER — Ambulatory Visit

## 2024-06-06 ENCOUNTER — Encounter: Payer: Self-pay | Admitting: Oncology

## 2024-06-06 VITALS — BP 118/81 | HR 84 | Temp 98.2°F | Resp 18 | Ht 64.0 in | Wt 138.3 lb

## 2024-06-06 DIAGNOSIS — C569 Malignant neoplasm of unspecified ovary: Secondary | ICD-10-CM | POA: Insufficient documentation

## 2024-06-06 DIAGNOSIS — G620 Drug-induced polyneuropathy: Secondary | ICD-10-CM | POA: Diagnosis not present

## 2024-06-06 DIAGNOSIS — D701 Agranulocytosis secondary to cancer chemotherapy: Secondary | ICD-10-CM | POA: Diagnosis not present

## 2024-06-06 DIAGNOSIS — C786 Secondary malignant neoplasm of retroperitoneum and peritoneum: Secondary | ICD-10-CM | POA: Diagnosis not present

## 2024-06-06 LAB — CMP (CANCER CENTER ONLY)
ALT: 39 U/L (ref 0–44)
AST: 38 U/L (ref 15–41)
Albumin: 4 g/dL (ref 3.5–5.0)
Alkaline Phosphatase: 79 U/L (ref 38–126)
Anion gap: 12 (ref 5–15)
BUN: 22 mg/dL (ref 8–23)
CO2: 24 mmol/L (ref 22–32)
Calcium: 9.6 mg/dL (ref 8.9–10.3)
Chloride: 105 mmol/L (ref 98–111)
Creatinine: 0.64 mg/dL (ref 0.44–1.00)
GFR, Estimated: >60 mL/min (ref >60)
Glucose, Bld: 78 mg/dL (ref 70–99)
Potassium: 4.4 mmol/L (ref 3.5–5.1)
Sodium: 141 mmol/L (ref 135–145)
Total Bilirubin: 0.4 mg/dL (ref 0.0–1.2)
Total Protein: 7 g/dL (ref 6.5–8.1)

## 2024-06-06 LAB — CBC WITH DIFFERENTIAL (CANCER CENTER ONLY)
Abs Immature Granulocytes: 0.02 K/uL (ref 0.00–0.07)
Basophils Absolute: 0 K/uL (ref 0.0–0.1)
Basophils Relative: 1 %
Eosinophils Absolute: 0.1 K/uL (ref 0.0–0.5)
Eosinophils Relative: 2 %
HCT: 42.5 % (ref 36.0–46.0)
Hemoglobin: 13.7 g/dL (ref 12.0–15.0)
Immature Granulocytes: 0 %
Lymphocytes Relative: 23 %
Lymphs Abs: 1.4 K/uL (ref 0.7–4.0)
MCH: 31.2 pg (ref 26.0–34.0)
MCHC: 32.2 g/dL (ref 30.0–36.0)
MCV: 96.8 fL (ref 80.0–100.0)
Monocytes Absolute: 0.7 K/uL (ref 0.1–1.0)
Monocytes Relative: 11 %
Neutro Abs: 4 K/uL (ref 1.7–7.7)
Neutrophils Relative %: 63 %
Platelet Count: 247 K/uL (ref 150–400)
RBC: 4.39 MIL/uL (ref 3.87–5.11)
RDW: 16.5 % — ABNORMAL HIGH (ref 11.5–15.5)
WBC Count: 6.2 K/uL (ref 4.0–10.5)
nRBC: 0 % (ref 0.0–0.2)

## 2024-06-06 NOTE — Progress Notes (Deleted)
 [facility] OFFICE PROGRESS NOTE   Diagnosis:   INTERVAL HISTORY:   ***  Objective:  Vital signs in last 24 hours:  Blood pressure 118/81, pulse 84, temperature 98.2 F (36.8 C), temperature source Temporal, resp. rate 18, height 5' 4 (1.626 m), weight 138 lb 4.8 oz (62.7 kg), SpO2 98%.    HEENT: *** Lymphatics: *** Resp: *** Cardio: *** GI: *** Vascular: *** Neuro:***  Skin:***   Portacath/PICC-without erythema  Lab Results:  Lab Results  Component Value Date   WBC 6.2 06/06/2024   HGB 13.7 06/06/2024   HCT 42.5 06/06/2024   MCV 96.8 06/06/2024   PLT 247 06/06/2024   NEUTROABS 4.0 06/06/2024    CMP  Lab Results  Component Value Date   NA 141 06/06/2024   K 4.4 06/06/2024   CL 105 06/06/2024   CO2 24 06/06/2024   GLUCOSE 78 06/06/2024   BUN 22 06/06/2024   CREATININE 0.64 06/06/2024   CALCIUM  9.6 06/06/2024   PROT 7.0 06/06/2024   ALBUMIN 4.0 06/06/2024   AST 38 06/06/2024   ALT 39 06/06/2024   ALKPHOS 79 06/06/2024   BILITOT 0.4 06/06/2024   GFRNONAA >60 06/06/2024   GFRAA >60 03/13/2020    Lab Results  Component Value Date   CEA 0.8 07/13/2012   CA125 8 08/18/2016    Lab Results  Component Value Date   INR 1.0 11/02/2022   LABPROT 12.9 11/02/2022    Imaging:  No results found.  Medications: I have reviewed the patient's current medications.   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 A 5 mm nodules remain on the right diaphragm. - TumorNext paired germline/tumor analyses: No somatic variants detected, germline CHEK2      VUS      Cycle 1 of adjuvant Taxol /carboplatin  chemotherapy initiated on 09/19/2012.   The CA 125 normalized.   She completed day 15 of cycle 6 on 02/06/2013.   Restaging CT evaluation 03/29/2013 showed no evidence of metastatic  disease in the chest. There was marked improvement in appearance/resolution of previous described peritoneal/omental disease. There was no convincing evidence of residual disease. There was minimal increased density in the region of the omentum favored to be treatment-related. There was no pelvic adenopathy. CA125 3.2 on 02/19/2014. 08/18/2016 CA-125 8 CT abdomen/pelvis 08/25/2016-solitary new enlarged right external iliac lymph node measuring 2.2 cm. Biopsy 09/07/2016-adenocarcinoma consistent with high-grade serous carcinoma.  ER +90%, PR -0%, HER2 negative (0); foundation 1-HRD not detected, microsatellite status cannot be determined, tumor mutation burden 8, FOLR1 positive-80%.   PET scan 09/22/2016-malignant range FDG uptake associated with the enlarged right external iliac lymph node compatible with metastatic adenopathy.  No additional sites of metastatic disease identified. Radiation 10/25/2016-12/01/2016 PET scan 03/28/2017-previously enlarged hypermetabolic right external iliac node-normal in size with resolution of hypermetabolic activity, no active malignancy identified PET scan 05/15/2018-no evidence of recurrent or metastatic disease, no hypermetabolic lymph nodes CT abdomen/pelvis 09/19/2019-mild increase in the size of several left upper quadrant peritoneal nodules measuring up to 11 mm.  No other sites of metastatic disease identified.  No ascites. CT abdomen/pelvis 03/14/2020-slight enlargement of several left upper quadrant peritoneal nodules, no ascites, no other evidence of disease progression CT abdomen/pelvis 09/12/2020 peritoneal nodularity/omental caking predominantly in the left upper/mid abdomen, mildly progressive.  Largest implant in the left upper abdomen adjacent to the stomach now measures 17 mm, previously 13 mm.  Overall volume of peritoneal disease has mildly progressed. CT abdomen/pelvis 01/13/2021- 4 mm right ureteral calculus with moderate right hydronephrosis and upper  right hydroureter, progressive omental nodularity, stable calcified and partially calcified right pelvic nodules CT abdomen/pelvis 05/13/2021-enlargement of left upper quadrant omental nodules and a peritoneal nodule at the right iliac fossa, no ascites CT abdomen/pelvis 08/14/2021-mild increase in size of omental nodules, no ascites, no new site of metastatic disease Taxol /carboplatin  09/09/2021, 09/15/2021, 09/22/2021 Taxol /carboplatin  10/07/2021, 10/13/2021, 10/20/2021 Taxol /Carboplatin  11/03/2021, 11/10/2021, 11/17/2021 CT 11/23/2021-decrease in peritoneal metastases, no new or progressive disease, airspace opacity at both lung bases Taxol /carboplatin  12/01/2021, 12/08/2021, 12/15/2021 Taxol /Carboplatin  every 2 weeks beginning 12/29/2021 Carboplatin  alone 01/26/2022, Taxol  held due to neuropathy Carboplatin  alone 02/09/2022, Taxol  held due to neuropathy Carboplatin  alone 02/23/2022, Taxol  held due to neuropathy Carboplatin  alone 03/09/2022, Taxol  held due to neuropathy CTs 03/25/2022-no significant change in omental, mesenteric disease.  No new or clearly progressive findings.  Previously noted patchy areas of consolidative opacity at the lung bases have resolved. Treatment break beginning 03/26/2022 CT/pelvis 08/10/2022-increased size of peritoneal nodules, increased small volume ascites, mild right lower lobe pleural nodularity Cycle 1 salvage carboplatin  09/14/2022 (weekly x 3 followed by 1 week break) Cycle 2 salvage carboplatin  10/12/2022 (weekly x 3 followed by 1 week break) Cycle 3 salvage carboplatin  11/16/2022 (weekly x 3 followed by 1 week break) CT abdomen/pelvis 12/08/2022-mild increase in peritoneal carcinomatosis Tamoxifen  20 mg daily 01/07/2023 CT abdomen/pelvis 03/29/2023-increased ascites with persistent diffuse peritoneal nodularity, largest omental implants have decreased in size.  No enlarging implants.,  No evidence of bowel obstruction Paracentesis for 4.5 L 06/29/2023 CTs 08/02/2023-peritoneal  carcinomatosis similar to slightly improved.  Stable volume of ascites.  No evidence of metastatic disease in the chest. Tamoxifen  20 mg daily continued 08/02/2023 Paracentesis 08/25/2023-4 L of fluid removed, cytology shows adenocarcinoma Plan for Doxil  Baseline 2D echo 09/23/2023-LVEF 55 to 60%.  Left ventricle has normal function.  Abnormal strain with apical sparing, follow-up TTE/MRI suggested in 3 months. Cycle 1 Doxil  10/06/2023 Cycle 2 Doxil  11/03/2023 CT abdomen/pelvis 11/16/2023: Compared to 08/02/2023 there is overall stable disease, new 0.9 cm nodule at the left descending colon, I reviewed the CT images.  Multiple lesions appear unchanged.  Stable ascites and peritoneal thickening Cycle 3 Doxil  12/01/2023 Echocardiogram 12/20/2023: LVEF 55-60% Cycle 4 Doxil  12/27/2023 Cycle 5 Doxil  01/24/2024 CTs abdomen/pelvis 02/14/2024-interval increase in the amount of ascites and peritoneal carcinomatosis.  No acute inflammatory process. Cycle 1 mirvetuximab 03/02/2024 Cycle 2 mirvetuximab 03/26/2024 Cycle 3 mirvetuximab 04/16/2024 CT 04/28/2024-diminished volume of extensively loculated fluid throughout the abdomen.  Some peritoneal nodules in the left upper quadrant diminished in size, other nodules unchanged.  Overall findings consistent with slightly improved peritoneal metastatic disease.  No new disease in the abdomen or pelvis. Cycle 4 mirvetuximab 05/14/2024   2. Low abdomen/suprapubic pain prior to the exploratory laparotomy-likely secondary to omental/pelvic tumor; persistent mild pain in the lower abdomen   3. Chronic neck and back pain.   4. Anxiety -persistent despite Lexapro  and Xanax . She has been evaluated by psychiatry. Currently on Lexapro . 5. Status post Port-A-Cath placement 09/22/2012. The Port-A-Cath was removed on 04/03/2013.   6. Neutropenia secondary to chemotherapy- day 15 cycle 1 and cycle 3. Taxol /carboplatin  not given secondary to neutropenia.    7. Herpes zoster involving a  right thoracic dermatome July 2015. She completed a course of Valtrex . 8. Nodular bony prominence at the left pelvis on rectal exam 06/05/2015-likely a benign finding 9.  Right ureter stone/hydroureteronephrosis on CT 01/13/2021-referred to  urology; status post right ureteroscopy with stone extraction and ureteral stent placement.  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  Right hip fracture following a fall 11/02/2022-status post right hip anterior hemiarthroplasty 12.  Acute localized thrombus right proximal femoral vein 11/16/2022 Eliquis  initiated 13.  Admission 06/24/2023 with volume overload      Disposition: ***  Arley Hof, MD  06/06/2024  12:02 PM

## 2024-06-06 NOTE — Progress Notes (Signed)
 [facility] OFFICE PROGRESS NOTE   Diagnosis: Ovarian cancer  INTERVAL HISTORY:   [name] [name] completed another treat with mirvetuximab on 05/14/2024.  She developed burning and dryness of the eyes.  She has blurred vision.  She saw ophthalmology and began Restasis drops on 06/04/2024.  She continues artificial tears.  She is no longer taking Decadron  eyedrops.  No abdominal pain.  Good appetite.  Objective:  Vital signs in last 24 hours:  Blood pressure 118/81, pulse 84, temperature 98.2 F (36.8 C), temperature source Temporal, resp. rate 18, height 5' 4 (1.626 m), weight 138 lb 4.8 oz (62.7 kg), SpO2 98%.    HEENT: Mild right greater than left conjunctival erythema Resp: Lungs clear bilaterally Cardio: Regular rate and rhythm GI: No hepatosplenomegaly, no apparent ascites, mild tenderness in the right lower abdomen, no mass Vascular: No leg edema   Portacath/PICC-without erythema  Lab Results:  Lab Results  Component Value Date   WBC 6.2 06/06/2024   HGB 13.7 06/06/2024   HCT 42.5 06/06/2024   MCV 96.8 06/06/2024   PLT 247 06/06/2024   NEUTROABS 4.0 06/06/2024    CMP  Lab Results  Component Value Date   NA 141 06/06/2024   K 4.4 06/06/2024   CL 105 06/06/2024   CO2 24 06/06/2024   GLUCOSE 78 06/06/2024   BUN 22 06/06/2024   CREATININE 0.64 06/06/2024   CALCIUM  9.6 06/06/2024   PROT 7.0 06/06/2024   ALBUMIN 4.0 06/06/2024   AST 38 06/06/2024   ALT 39 06/06/2024   ALKPHOS 79 06/06/2024   BILITOT 0.4 06/06/2024   GFRNONAA >60 06/06/2024   GFRAA >60 03/13/2020    Lab Results  Component Value Date   CEA 0.8 07/13/2012   CA125 8 08/18/2016    Medications: I have reviewed the patient's current medications.   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 A 5 mm nodules  remain on the right diaphragm. - TumorNext paired germline/tumor analyses: No somatic variants detected, germline CHEK2      VUS      Cycle 1 of adjuvant Taxol /carboplatin  chemotherapy initiated on 09/19/2012.   The CA 125 normalized.   She completed day 15 of cycle 6 on 02/06/2013.   Restaging CT evaluation 03/29/2013 showed no evidence of metastatic disease in the chest. There was marked improvement in appearance/resolution of previous described peritoneal/omental disease. There was no convincing evidence of residual disease. There was minimal increased density in the region of the omentum favored to be treatment-related. There was no pelvic adenopathy. CA125 3.2 on 02/19/2014. 08/18/2016 CA-125 8 CT abdomen/pelvis 08/25/2016-solitary new enlarged right external iliac lymph node measuring 2.2 cm. Biopsy 09/07/2016-adenocarcinoma consistent with high-grade serous carcinoma.  ER +90%, PR -0%, HER2 negative (0); foundation 1-HRD not detected, microsatellite status cannot be determined, tumor mutation burden 8, FOLR1 positive-80%.   PET scan 09/22/2016-malignant range FDG uptake associated with the enlarged right external iliac lymph node compatible with metastatic adenopathy.  No additional sites of metastatic disease identified. Radiation 10/25/2016-12/01/2016 PET scan 03/28/2017-previously enlarged hypermetabolic right external iliac node-normal in size with resolution of hypermetabolic activity, no active malignancy identified PET scan 05/15/2018-no evidence of recurrent or metastatic disease, no hypermetabolic lymph nodes CT abdomen/pelvis 09/19/2019-mild increase in the size of several left upper quadrant peritoneal nodules measuring up to 11 mm.  No other sites of metastatic disease identified.  No ascites. CT abdomen/pelvis 03/14/2020-slight enlargement  of several left upper quadrant peritoneal nodules, no ascites, no other evidence of disease progression CT abdomen/pelvis 09/12/2020 peritoneal  nodularity/omental caking predominantly in the left upper/mid abdomen, mildly progressive.  Largest implant in the left upper abdomen adjacent to the stomach now measures 17 mm, previously 13 mm.  Overall volume of peritoneal disease has mildly progressed. CT abdomen/pelvis 01/13/2021- 4 mm right ureteral calculus with moderate right hydronephrosis and upper right hydroureter, progressive omental nodularity, stable calcified and partially calcified right pelvic nodules CT abdomen/pelvis 05/13/2021-enlargement of left upper quadrant omental nodules and a peritoneal nodule at the right iliac fossa, no ascites CT abdomen/pelvis 08/14/2021-mild increase in size of omental nodules, no ascites, no new site of metastatic disease Taxol /carboplatin  09/09/2021, 09/15/2021, 09/22/2021 Taxol /carboplatin  10/07/2021, 10/13/2021, 10/20/2021 Taxol /Carboplatin  11/03/2021, 11/10/2021, 11/17/2021 CT 11/23/2021-decrease in peritoneal metastases, no new or progressive disease, airspace opacity at both lung bases Taxol /carboplatin  12/01/2021, 12/08/2021, 12/15/2021 Taxol /Carboplatin  every 2 weeks beginning 12/29/2021 Carboplatin  alone 01/26/2022, Taxol  held due to neuropathy Carboplatin  alone 02/09/2022, Taxol  held due to neuropathy Carboplatin  alone 02/23/2022, Taxol  held due to neuropathy Carboplatin  alone 03/09/2022, Taxol  held due to neuropathy CTs 03/25/2022-no significant change in omental, mesenteric disease.  No new or clearly progressive findings.  Previously noted patchy areas of consolidative opacity at the lung bases have resolved. Treatment break beginning 03/26/2022 CT/pelvis 08/10/2022-increased size of peritoneal nodules, increased small volume ascites, mild right lower lobe pleural nodularity Cycle 1 salvage carboplatin  09/14/2022 (weekly x 3 followed by 1 week break) Cycle 2 salvage carboplatin  10/12/2022 (weekly x 3 followed by 1 week break) Cycle 3 salvage carboplatin  11/16/2022 (weekly x 3 followed by 1 week break) CT  abdomen/pelvis 12/08/2022-mild increase in peritoneal carcinomatosis Tamoxifen  20 mg daily 01/07/2023 CT abdomen/pelvis 03/29/2023-increased ascites with persistent diffuse peritoneal nodularity, largest omental implants have decreased in size.  No enlarging implants.,  No evidence of bowel obstruction Paracentesis for 4.5 L 06/29/2023 CTs 08/02/2023-peritoneal carcinomatosis similar to slightly improved.  Stable volume of ascites.  No evidence of metastatic disease in the chest. Tamoxifen  20 mg daily continued 08/02/2023 Paracentesis 08/25/2023-4 L of fluid removed, cytology shows adenocarcinoma Plan for Doxil  Baseline 2D echo 09/23/2023-LVEF 55 to 60%.  Left ventricle has normal function.  Abnormal strain with apical sparing, follow-up TTE/MRI suggested in 3 months. Cycle 1 Doxil  10/06/2023 Cycle 2 Doxil  11/03/2023 CT abdomen/pelvis 11/16/2023: Compared to 08/02/2023 there is overall stable disease, new 0.9 cm nodule at the left descending colon, I reviewed the CT images.  Multiple lesions appear unchanged.  Stable ascites and peritoneal thickening Cycle 3 Doxil  12/01/2023 Echocardiogram 12/20/2023: LVEF 55-60% Cycle 4 Doxil  12/27/2023 Cycle 5 Doxil  01/24/2024 CTs abdomen/pelvis 02/14/2024-interval increase in the amount of ascites and peritoneal carcinomatosis.  No acute inflammatory process. Cycle 1 mirvetuximab 03/02/2024 Cycle 2 mirvetuximab 03/26/2024 Cycle 3 mirvetuximab 04/16/2024 CT 04/28/2024-diminished volume of extensively loculated fluid throughout the abdomen.  Some peritoneal nodules in the left upper quadrant diminished in size, other nodules unchanged.  Overall findings consistent with slightly improved peritoneal metastatic disease.  No new disease in the abdomen or pelvis. Cycle 4 mirvetuximab 05/14/2024 Cycle 5 mirvetuximab held on 06/06/2024 secondary to ocular toxicity   2. Low abdomen/suprapubic pain prior to the exploratory laparotomy-likely secondary to omental/pelvic tumor; persistent  mild pain in the lower abdomen   3. Chronic neck and back pain.   4. Anxiety -persistent despite Lexapro  and Xanax . She has been evaluated by psychiatry. Currently on Lexapro . 5. Status post Port-A-Cath placement 09/22/2012. The Port-A-Cath was removed on 04/03/2013.   6. Neutropenia  secondary to chemotherapy- day 15 cycle 1 and cycle 3. Taxol /carboplatin  not given secondary to neutropenia.    7. Herpes zoster involving a right thoracic dermatome July 2015. She completed a course of Valtrex . 8. Nodular bony prominence at the left pelvis on rectal exam 06/05/2015-likely a benign finding 9.  Right ureter stone/hydroureteronephrosis on CT 01/13/2021-referred to urology; status post right ureteroscopy with stone extraction and ureteral stent placement.  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  Right hip fracture following a fall 11/02/2022-status post right hip anterior hemiarthroplasty 12.  Acute localized thrombus right proximal femoral vein 11/16/2022 Eliquis  initiated 13.  Admission 06/24/2023 with volume overload     Disposition: Ms. Lorenzi has advanced stage ovarian cancer.  She has completed 4 cycles of mirvetuximab.  There is no clinical evidence of progressive ovarian cancer.  She has developed ocular toxicity with dryness, burning, and decreased visual acuity.  She is followed by Dr. Ermelinda.  I recommended she schedule a follow-up appoint with Dr. Ermelinda.  She will continue ocular treatment as directed by Dr. Ermelinda.  Mirvetuximab will be placed on hold.  She will return for an office visit in 2 weeks.  Ms. Bayless indicated she does not wish to receive further mirvetuximab due to the ocular toxicity.    Arley Hof, MD  06/06/2024  12:16 PM

## 2024-06-07 LAB — CA 125: Cancer Antigen (CA) 125: 9.1 U/mL (ref 0.0–38.1)

## 2024-06-15 ENCOUNTER — Ambulatory Visit: Admitting: Nurse Practitioner

## 2024-06-18 ENCOUNTER — Encounter: Payer: Self-pay | Admitting: Nurse Practitioner

## 2024-06-18 ENCOUNTER — Other Ambulatory Visit: Payer: Self-pay | Admitting: Oncology

## 2024-06-18 ENCOUNTER — Inpatient Hospital Stay (HOSPITAL_BASED_OUTPATIENT_CLINIC_OR_DEPARTMENT_OTHER): Admitting: Nurse Practitioner

## 2024-06-18 VITALS — BP 113/72 | HR 95 | Temp 98.1°F | Resp 18 | Ht 64.0 in | Wt 140.4 lb

## 2024-06-18 DIAGNOSIS — C569 Malignant neoplasm of unspecified ovary: Secondary | ICD-10-CM | POA: Diagnosis not present

## 2024-06-18 DIAGNOSIS — G620 Drug-induced polyneuropathy: Secondary | ICD-10-CM | POA: Diagnosis not present

## 2024-06-18 DIAGNOSIS — D701 Agranulocytosis secondary to cancer chemotherapy: Secondary | ICD-10-CM | POA: Diagnosis not present

## 2024-06-18 DIAGNOSIS — C786 Secondary malignant neoplasm of retroperitoneum and peritoneum: Secondary | ICD-10-CM | POA: Diagnosis not present

## 2024-06-18 NOTE — Progress Notes (Unsigned)
 [name] [name]   Diagnosis: Ovarian cancer  INTERVAL HISTORY:   [name] [name] returns as scheduled.  She has completed 4 cycles of mirvetuximab.  Treatment placed on hold 06/06/2024 due to eye toxicity.  She reports symptoms are better.  Specifically vision is less blurry.  Bowels are moving.  No nausea or vomiting.  Stable neuropathy symptoms.  Objective:  Vital signs in last 24 hours:  Blood pressure 113/72, pulse 95, temperature 98.1 F (36.7 C), temperature source Temporal, resp. rate 18, height 5' 4 (1.626 m), weight 140 lb 6.4 oz (63.7 kg), SpO2 99%.     Resp: Lungs clear bilaterally. Cardio: Regular rate and rhythm. GI: No hepatosplenomegaly.  No mass.  Tender at the right lower abdomen. Vascular: No leg edema. Port-A-Cath without erythema.  Lab Results:  Lab Results  Component Value Date   WBC 6.2 06/06/2024   HGB 13.7 06/06/2024   HCT 42.5 06/06/2024   MCV 96.8 06/06/2024   PLT 247 06/06/2024   NEUTROABS 4.0 06/06/2024    Imaging:  No results found.  Medications: I have reviewed the patient's current medications.  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 A 5 mm nodules remain on the right diaphragm. - TumorNext paired germline/tumor analyses: No somatic variants detected, germline CHEK2      VUS      Cycle 1 of adjuvant Taxol /carboplatin  chemotherapy initiated on 09/19/2012.   The CA 125 normalized.   She completed day 15 of cycle 6 on 02/06/2013.   Restaging CT evaluation 03/29/2013 showed no evidence of metastatic disease in the chest. There was marked improvement in appearance/resolution of previous described peritoneal/omental disease. There was no convincing evidence of residual disease. There was minimal increased density in the region of the omentum  favored to be treatment-related. There was no pelvic adenopathy. CA125 3.2 on 02/19/2014. 08/18/2016 CA-125 8 CT abdomen/pelvis 08/25/2016-solitary new enlarged right external iliac lymph node measuring 2.2 cm. Biopsy 09/07/2016-adenocarcinoma consistent with high-grade serous carcinoma.  ER +90%, PR -0%, HER2 negative (0); foundation 1-HRD not detected, microsatellite status cannot be determined, tumor mutation burden 8, FOLR1 positive-80%.   PET scan 09/22/2016-malignant range FDG uptake associated with the enlarged right external iliac lymph node compatible with metastatic adenopathy.  No additional sites of metastatic disease identified. Radiation 10/25/2016-12/01/2016 PET scan 03/28/2017-previously enlarged hypermetabolic right external iliac node-normal in size with resolution of hypermetabolic activity, no active malignancy identified PET scan 05/15/2018-no evidence of recurrent or metastatic disease, no hypermetabolic lymph nodes CT abdomen/pelvis 09/19/2019-mild increase in the size of several left upper quadrant peritoneal nodules measuring up to 11 mm.  No other sites of metastatic disease identified.  No ascites. CT abdomen/pelvis 03/14/2020-slight enlargement of several left upper quadrant peritoneal nodules, no ascites, no other evidence of disease progression CT abdomen/pelvis 09/12/2020 peritoneal nodularity/omental caking predominantly in the left upper/mid abdomen, mildly progressive.  Largest implant in the left upper abdomen adjacent to the stomach now measures 17 mm, previously 13 mm.  Overall volume of peritoneal disease has mildly progressed. CT abdomen/pelvis 01/13/2021- 4 mm right ureteral calculus with moderate right hydronephrosis and upper right hydroureter, progressive omental nodularity, stable calcified and partially calcified right pelvic nodules CT abdomen/pelvis 05/13/2021-enlargement of left upper quadrant omental nodules and a peritoneal nodule at the right iliac fossa, no  ascites CT abdomen/pelvis 08/14/2021-mild increase in size of omental nodules, no ascites, no new site of  metastatic disease Taxol /carboplatin  09/09/2021, 09/15/2021, 09/22/2021 Taxol /carboplatin  10/07/2021, 10/13/2021, 10/20/2021 Taxol /Carboplatin  11/03/2021, 11/10/2021, 11/17/2021 CT 11/23/2021-decrease in peritoneal metastases, no new or progressive disease, airspace opacity at both lung bases Taxol /carboplatin  12/01/2021, 12/08/2021, 12/15/2021 Taxol /Carboplatin  every 2 weeks beginning 12/29/2021 Carboplatin  alone 01/26/2022, Taxol  held due to neuropathy Carboplatin  alone 02/09/2022, Taxol  held due to neuropathy Carboplatin  alone 02/23/2022, Taxol  held due to neuropathy Carboplatin  alone 03/09/2022, Taxol  held due to neuropathy CTs 03/25/2022-no significant change in omental, mesenteric disease.  No new or clearly progressive findings.  Previously noted patchy areas of consolidative opacity at the lung bases have resolved. Treatment break beginning 03/26/2022 CT/pelvis 08/10/2022-increased size of peritoneal nodules, increased small volume ascites, mild right lower lobe pleural nodularity Cycle 1 salvage carboplatin  09/14/2022 (weekly x 3 followed by 1 week break) Cycle 2 salvage carboplatin  10/12/2022 (weekly x 3 followed by 1 week break) Cycle 3 salvage carboplatin  11/16/2022 (weekly x 3 followed by 1 week break) CT abdomen/pelvis 12/08/2022-mild increase in peritoneal carcinomatosis Tamoxifen  20 mg daily 01/07/2023 CT abdomen/pelvis 03/29/2023-increased ascites with persistent diffuse peritoneal nodularity, largest omental implants have decreased in size.  No enlarging implants.,  No evidence of bowel obstruction Paracentesis for 4.5 L 06/29/2023 CTs 08/02/2023-peritoneal carcinomatosis similar to slightly improved.  Stable volume of ascites.  No evidence of metastatic disease in the chest. Tamoxifen  20 mg daily continued 08/02/2023 Paracentesis 08/25/2023-4 L of fluid removed, cytology shows adenocarcinoma Plan for  Doxil  Baseline 2D echo 09/23/2023-LVEF 55 to 60%.  Left ventricle has normal function.  Abnormal strain with apical sparing, follow-up TTE/MRI suggested in 3 months. Cycle 1 Doxil  10/06/2023 Cycle 2 Doxil  11/03/2023 CT abdomen/pelvis 11/16/2023: Compared to 08/02/2023 there is overall stable disease, new 0.9 cm nodule at the left descending colon, I reviewed the CT images.  Multiple lesions appear unchanged.  Stable ascites and peritoneal thickening Cycle 3 Doxil  12/01/2023 Echocardiogram 12/20/2023: LVEF 55-60% Cycle 4 Doxil  12/27/2023 Cycle 5 Doxil  01/24/2024 CTs abdomen/pelvis 02/14/2024-interval increase in the amount of ascites and peritoneal carcinomatosis.  No acute inflammatory process. Cycle 1 mirvetuximab 03/02/2024 Cycle 2 mirvetuximab 03/26/2024 Cycle 3 mirvetuximab 04/16/2024 CT 04/28/2024-diminished volume of extensively loculated fluid throughout the abdomen.  Some peritoneal nodules in the left upper quadrant diminished in size, other nodules unchanged.  Overall findings consistent with slightly improved peritoneal metastatic disease.  No new disease in the abdomen or pelvis. Cycle 4 mirvetuximab 05/14/2024 Cycle 5 mirvetuximab held on 06/06/2024 secondary to ocular toxicity   2. Low abdomen/suprapubic pain prior to the exploratory laparotomy-likely secondary to omental/pelvic tumor; persistent mild pain in the lower abdomen   3. Chronic neck and back pain.   4. Anxiety -persistent despite Lexapro  and Xanax . She has been evaluated by psychiatry. Currently on Lexapro . 5. Status post Port-A-Cath placement 09/22/2012. The Port-A-Cath was removed on 04/03/2013.   6. Neutropenia secondary to chemotherapy- day 15 cycle 1 and cycle 3. Taxol /carboplatin  not given secondary to neutropenia.    7. Herpes zoster involving a right thoracic dermatome July 2015. She completed a course of Valtrex . 8. Nodular bony prominence at the left pelvis on rectal exam 06/05/2015-likely a benign finding 9.  Right ureter  stone/hydroureteronephrosis on CT 01/13/2021-referred to urology; status post right ureteroscopy with stone extraction and ureteral stent placement.  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  Right hip fracture following a fall 11/02/2022-status post right hip anterior hemiarthroplasty 12.  Acute localized thrombus right proximal femoral vein 11/16/2022 Eliquis  initiated 13.  Admission 06/24/2023 with volume overload    Disposition: [name] [name] appears stable.  She has completed 4 cycles mirvetuximab.  Cycle 5 was held due to eye toxicity.  Eye symptoms are better.  She declines further mervetuxmab.  Dr. Cloretta reviewed other treatment options including bevacizumab, oral Etoposide , Gemcitabine.  She would like to proceed with Etoposide .  We reviewed potential toxicities including bone marrow toxicity, nausea, diarrhea, mouth sores, hair loss, secondary leukemia.  She agrees to proceed.  She will return for follow-up and baseline labs as scheduled 06/25/2024.  Anticipate she will begin cycle 1 oral Etoposide  following that visit.  Patient seen with Dr. Cloretta.  [name] [name] ANP/GNP-BC   06/18/2024  2:03 PM  This was a shared visit with [name] [name].  [name] [name] was interviewed and examined.  She developed ocular toxicity with mirvetuximab.  The CA125 and her symptoms have improved, but she does not wish to receive further treatment with mirvetuximab.  We discussed salvage systemic treatment options including bevacizumab, gemcitabine, and oral etoposide .  She does not wish to proceed bevacizumab at present.  She prefers oral etoposide .  We reviewed potential toxicities associated with oral etoposide  including the chance of hematologic toxicity, infection, and bleeding.  She agrees to proceed.  The plan is to begin oral etoposide  on a 3-week on/1 week off schedule off schedule after an office visit next week.  The etoposide  will be dose reduced based on the extensive previous treatment with  chemotherapy.  I was present for greater than 50% of today's visit.  I performed medical decision making.   Arvella Cloretta, MD

## 2024-06-19 ENCOUNTER — Telehealth: Payer: Self-pay | Admitting: Pharmacist

## 2024-06-19 ENCOUNTER — Encounter: Payer: Self-pay | Admitting: Oncology

## 2024-06-19 ENCOUNTER — Telehealth: Payer: Self-pay | Admitting: Pharmacy Technician

## 2024-06-19 DIAGNOSIS — C569 Malignant neoplasm of unspecified ovary: Secondary | ICD-10-CM

## 2024-06-19 MED ORDER — ETOPOSIDE 50 MG PO CAPS: 50.0000 mg | ORAL_CAPSULE | Freq: Every day | ORAL | 0 refills | Status: DC

## 2024-06-19 NOTE — Telephone Encounter (Signed)
 Clinical Pharmacist Practitioner Encounter   Received new prescription for etoposide  for the treatment of high grade serous carcinoma of the ovary, planned duration until disease progression or unacceptable drug toxicity. Planned start 06/25/24.  CMP from 06/06/24 assessed, no relevant lab abnormalities. Prescription dose and frequency assessed. MD starting patient on a reduced dose dose on the extensive previous treatment with chemotherapy.   Current medication list in Epic reviewed, no DDIs with etoposide  identified.  Evaluated chart and no patient barriers to medication adherence identified.   Prescription has been e-scribed to the [facility] for benefits analysis and approval.  Oral Oncology Clinic will continue to follow for insurance authorization, copayment issues, initial counseling and start date.   Messiyah Waterson N. Raeven Pint, PharmD, BCOP, CPP Hematology/Oncology Clinical Pharmacist [facility] Oral Chemotherapy Navigation Clinic [phone]  06/19/2024 4:33 PM

## 2024-06-19 NOTE — Telephone Encounter (Signed)
 Oral Oncology Patient Advocate Encounter  Was successful in finding patient's grant. There is an active $3500 grant from [employer] to provide copayment coverage for Vepesid .  This will keep the out of pocket expense at $0.     [employer] ID: [id number]   The billing information is as follows and has been shared with appropriate pharmacy.    RxBin: [id number] PCN: [id number] Member ID: [id number] Group ID: [id number] Dates of Eligibility: 02/05/2024 through 02/03/2025  Fund:  Ovarian Cancer - Medicare Access  [place] ([name]) [name], CPhT  [facility], [name], [facility] - Oral Chemotherapy Patient Advocate Specialist III Phone: [phone]  Fax: [phone]

## 2024-06-20 ENCOUNTER — Telehealth: Payer: Self-pay | Admitting: *Deleted

## 2024-06-20 ENCOUNTER — Other Ambulatory Visit (HOSPITAL_COMMUNITY): Payer: Self-pay

## 2024-06-20 ENCOUNTER — Telehealth: Payer: Self-pay | Admitting: Pharmacy Technician

## 2024-06-20 NOTE — Telephone Encounter (Signed)
 Oral Oncology Patient Advocate Encounter  After completing a benefits investigation, prior authorization for Etoposide  is not required at this time through [facility]  Patient's copay is $329.79.     [name] [name] ([name]) [name], CPhT  [facility], [name], [facility] - Oral Chemotherapy Patient Advocate Specialist III Phone: [phone]  Fax: [phone]

## 2024-06-20 NOTE — Telephone Encounter (Signed)
 Call to inquire on status of the etoposide  pills. Made her aware script sent to [facility] and still in the benefits investigation period. The oral oncology pharmacist will when completed and review the drug, side effects and how to take it. Can take up to 5 business days.

## 2024-06-21 ENCOUNTER — Ambulatory Visit (INDEPENDENT_AMBULATORY_CARE_PROVIDER_SITE_OTHER)

## 2024-06-21 ENCOUNTER — Other Ambulatory Visit: Payer: Self-pay | Admitting: Oncology

## 2024-06-21 VITALS — BP 113/72 | Ht 64.0 in | Wt 138.0 lb

## 2024-06-21 DIAGNOSIS — Z0000 Encounter for general adult medical examination without abnormal findings: Secondary | ICD-10-CM

## 2024-06-21 MED ORDER — ETOPOSIDE 50 MG PO CAPS
50.0000 mg | ORAL_CAPSULE | Freq: Every day | ORAL | 0 refills | Status: DC
  Filled 2024-06-22: qty 21, 28d supply, fill #0

## 2024-06-21 NOTE — Progress Notes (Signed)
 Because this visit was a virtual/telehealth visit,  certain criteria was not obtained, such a blood pressure, CBG if applicable, and timed get up and go. Any medications not marked as taking were not mentioned during the medication reconciliation part of the visit. Any vitals not documented were not able to be obtained due to this being a telehealth visit or patient was unable to self-report a recent blood pressure reading due to a lack of equipment at home via telehealth. Vitals that have been documented are verbally provided by the patient.  This visit was performed by a medical professional under my direct supervision. I was immediately available for consultation/collaboration. I have reviewed and agree with the Annual Wellness Visit documentation.  Subjective:   [name] [name] is a 73 y.o. who presents for a Medicare Wellness preventive visit.  As a reminder, Annual Wellness Visits don't include a physical exam, and some assessments may be limited, especially if this visit is performed virtually. We may recommend an in-person follow-up visit with your provider if needed.  Visit Complete: Virtual I connected with  [name] [name] on 06/21/24 by a audio enabled telemedicine application and verified that I am speaking with the correct person using two identifiers.  Patient Location: Home  Provider Location: Home Office  I discussed the limitations of evaluation and management by telemedicine. The patient expressed understanding and agreed to proceed.  Vital Signs: Because this visit was a virtual/telehealth visit, some criteria may be missing or patient reported. Any vitals not documented were not able to be obtained and vitals that have been documented are patient reported.  VideoDeclined- This patient declined Librarian, academic. Therefore the visit was completed with audio only.  Persons Participating in Visit: Patient.  AWV Questionnaire: No: Patient Medicare  AWV questionnaire was not completed prior to this visit.  Cardiac Risk Factors include: advanced age (>54men, >32 women);dyslipidemia     Objective:    Today's Vitals   06/21/24 1518  BP: 113/72  Weight: 138 lb (62.6 kg)  Height: 5' 4 (1.626 m)  PainSc: 0-No pain   Body mass index is 23.69 kg/m.     06/21/2024    3:17 PM 06/18/2024    2:03 PM 06/06/2024   11:39 AM 04/16/2024    8:11 AM 03/19/2024    9:32 AM 02/14/2024   10:51 AM 02/13/2024    1:19 PM  Advanced Directives  Does Patient Have a Medical Advance Directive? No No No No No No No  Type of Advance Directive      Living will;Healthcare Power of Attorney   Does patient want to make changes to medical advance directive? No - Patient declined No - Patient declined  Yes (MAU/Ambulatory/Procedural Areas - Information given) No - Patient declined No - Patient declined   Would patient like information on creating a medical advance directive? No - Patient declined No - Patient declined No - Patient declined  No - Patient declined No - Patient declined No - Patient declined    Current Medications (verified) Outpatient Encounter Medications as of 06/21/2024  Medication Sig   apixaban  (ELIQUIS ) 5 MG TABS tablet Take 5 mg by mouth 2 (two) times daily.   Artificial Tears ophthalmic solution Place 1-2 drops into both eyes 4 (four) times daily. Wait at least 10 minutes after steroid drop use before administration   dexamethasone  (DECADRON ) 0.1 % ophthalmic solution Place 1 drop into both eyes every 3 (three) hours.   docusate sodium  (COLACE) 100 MG capsule  Take 100 mg by mouth daily as needed for mild constipation.   [START ON 06/26/2024] etoposide  (VEPESID ) 50 MG capsule Take 1 capsule (50 mg total) by mouth daily. Take for 21 days, then hold for 7 days. Repeat every 28 days.   fish oil-omega-3 fatty acids 1000 MG capsule Take 1 g by mouth daily.   Probiotic Product (FORTIFY PROBIOTIC WOMENS PO) Take 1 tablet by mouth 1 day or 1 dose.    RESTASIS 0.05 % ophthalmic emulsion Place 1 drop into both eyes 2 (two) times daily.   furosemide  (LASIX ) 20 MG tablet Take 20 mg by mouth. (Patient not taking: Reported on 06/21/2024)   HYDROcodone -acetaminophen  (NORCO/VICODIN) 5-325 MG tablet Take 1-2 tablets by mouth every 6 (six) hours as needed for moderate pain (pain score 4-6). Do not drive while taking (Patient not taking: Reported on 06/21/2024)   LORazepam  (ATIVAN ) 0.5 MG tablet Take 1 tablet (0.5 mg total) by mouth every 8 (eight) hours as needed (nausea). Do not drive while taking (Patient not taking: Reported on 06/21/2024)   ondansetron  (ZOFRAN ) 8 MG tablet Take 1 tablet (8 mg total) by mouth every 8 (eight) hours as needed for nausea or vomiting. (Patient not taking: Reported on 06/21/2024)   pantoprazole  (PROTONIX ) 40 MG tablet Take 1 tablet (40 mg total) by mouth daily. (Patient not taking: Reported on 06/21/2024)   prochlorperazine  (COMPAZINE ) 10 MG tablet Take 1 tablet (10 mg total) by mouth every 6 (six) hours as needed for nausea or vomiting. (Patient not taking: Reported on 06/21/2024)   torsemide  (DEMADEX ) 20 MG tablet Take 20 mg by mouth daily. (Patient not taking: Reported on 06/21/2024)   Facility-Administered Encounter Medications as of 06/21/2024  Medication   heparin  lock flush 100 unit/mL   sodium chloride  flush (NS) 0.9 % injection 10 mL    Allergies (verified) Atorvastatin, Macrodantin, and Decongest-aid [pseudoephedrine]   History: Past Medical History:  Diagnosis Date   Anxiety and depression     Ascites    Chest pain    a. 09/2004 MV: No isch/infarct.   DJD (degenerative joint disease)    Elevated cholesterol    Endometrial polyp    Family history of bladder cancer    Family history of lymphoma    History of radiation therapy 10/25/16-12/01/16   right pelvis/60.2 Gy in 28 fractions   IRRITABLE BOWEL SYNDROME, HX OF 05/15/2008   Kidney stone    Lower extremity edema    MVP (mitral valve prolapse)    occ  palpitations    Ovarian ca (HCC) 07/2012       Rheumatoid arthritis(714.0) ~ 2010   dr lenon   Right leg DVT (HCC)    a. 11/2022 following R hip fx/surgery-->eliquis .   Shingles    Past Surgical History:  Procedure Laterality Date   ABDOMINAL HYSTERECTOMY  08/22/2012   Procedure: HYSTERECTOMY ABDOMINAL;  Surgeon: Toribio LITTIE Percy, MD;  Location: [facility];  Service: Gynecology;  Laterality: N/A;   ANTERIOR APPROACH HEMI HIP ARTHROPLASTY Right 11/03/2022   Procedure: ANTERIOR APPROACH HEMI HIP ARTHROPLASTY;  Surgeon: Leora Lynwood SAUNDERS, MD;  Location: [facility];  Service: Orthopedics;  Laterality: Right;   BACK SURGERY  10/04/2006   Dr Malcolm   COLONOSCOPY  04/20/2004   COLONOSCOPY W/ BIOPSIES  07/04/2014   COLOSTOMY REVISION  08/22/2012   Procedure: COLON RESECTION SIGMOID;  Surgeon: Toribio LITTIE Percy, MD;  Location: [facility];  Service: Gynecology;;  Rectal Sigmoid resection and low rectal anastomosis   CYST ON NECK  10/04/2009   DEBULKING  08/22/2012   Procedure: DEBULKING;  Surgeon: Toribio LITTIE Percy, MD;  Location: [facility];  Service: Gynecology;  Laterality: N/A;  Radical tumor debulking, Bilateral Ureterolysis   DILATION AND CURETTAGE OF UTERUS  10/04/2002   WITH HYSTEROSCOPY   HAND SURGERY  10/04/1994   HYSTEROSCOPY  10/04/2002   D&C   IR IMAGING GUIDED PORT INSERTION  09/01/2021   IR PARACENTESIS  09/27/2023   KIDNEY STONE SURGERY Right    with stent placement  Mar 03 2021   NECK SURGERY  10/05/2007   SPURS   OMENTECTOMY  08/22/2012   Procedure: OMENTECTOMY;  Surgeon: Toribio LITTIE Percy, MD;  Location: [facility];  Service: Gynecology;  Laterality: N/A;   SALPINGOOPHORECTOMY  08/22/2012   Procedure: SALPINGO OOPHORECTOMY;  Surgeon: Toribio LITTIE Percy, MD;  Location: [facility];  Service: Gynecology;  Laterality: Bilateral;   TUBAL LIGATION  10/04/1978   Family History  Problem Relation Age of Onset   Heart disease Mother    Bladder Cancer Father 54    Osteoporosis Sister    Lymphoma Paternal Aunt    Colon cancer Neg Hx    Rectal cancer Neg Hx    Stomach cancer Neg Hx    Diabetes Neg Hx    Stroke Neg Hx    Colon polyps Neg Hx    Social History   Socioeconomic History   Marital status: Legally Separated    Spouse name: Not on file   Number of children: 2   Years of education: Not on file   Highest education level: Not on file  Occupational History   Occupation: disability d/t RA  Tobacco Use   Smoking status: Never   Smokeless tobacco: Never  Vaping Use   Vaping status: Never Used  Substance and Sexual Activity   Alcohol use: No   Drug use: No   Sexual activity: Not Currently    Birth control/protection: Post-menopausal, Surgical  Other Topics Concern   Not on file  Social History Narrative   Lenward Fleeta Evangelist is her daughter    Household-- pr and husband   Social Drivers of Corporate investment banker Strain: Low Risk  (06/21/2024)   Overall Financial Resource Strain (CARDIA)    Difficulty of Paying Living Expenses: Not hard at all  Food Insecurity: No Food Insecurity (06/21/2024)   Hunger Vital Sign    Worried About Running Out of Food in the Last Year: Never true    Ran Out of Food in the Last Year: Never true  Transportation Needs: No Transportation Needs (06/21/2024)   PRAPARE - Administrator, Civil Service (Medical): No    Lack of Transportation (Non-Medical): No  Physical Activity: Insufficiently Active (06/21/2024)   Exercise Vital Sign    Days of Exercise per Week: 4 days    Minutes of Exercise per Session: 30 min  Stress: No Stress Concern Present (06/21/2024)   [employer] of Occupational Health - Occupational Stress Questionnaire    Feeling of Stress: Not at all  Social Connections: Moderately Isolated (06/21/2024)   Social Connection and Isolation Panel    Frequency of Communication with Friends and Family: More than three times a week    Frequency of Social Gatherings with Friends  and Family: More than three times a week    Attends Religious Services: More than 4 times per year    Active Member of [employer] or Organizations: No    Attends Banker Meetings: Never  Marital Status: Separated    Tobacco Counseling Counseling given: Not Answered    Clinical Intake:  Pre-visit preparation completed: Yes  Pain : No/denies pain Pain Score: 0-No pain     BMI - recorded: 23.69 Nutritional Status: BMI of 19-24  Normal Nutritional Risks: None Diabetes: No  No results found for: HGBA1C   How often do you need to have someone help you when you read instructions, pamphlets, or other written materials from your doctor or pharmacy?: 1 - Never  Interpreter Needed?: No  Information entered by :: Maddyson Keil,cma   Activities of Daily Living     06/21/2024    3:21 PM 06/25/2023    1:09 AM  In your present state of health, do you have any difficulty performing the following activities:  Hearing? 1   Vision? 0   Difficulty concentrating or making decisions? 0   Walking or climbing stairs? 0   Dressing or bathing? 0   Doing errands, shopping? 0 0  Preparing Food and eating ? N   Using the Toilet? N   In the past six months, have you accidently leaked urine? N   Do you have problems with loss of bowel control? Y   Managing your Medications? N   Managing your Finances? N   Housekeeping or managing your Housekeeping? N     Patient Care Team: Wendee Lynwood HERO, NP as PCP - General (Nurse Practitioner) Aneita Gwendlyn DASEN, MD (Inactive) as Consulting Physician (Gastroenterology) Christi Glendia HERO, MD as Consulting Physician (Pulmonary Disease) Cloretta Arley NOVAK, MD as Consulting Physician (Oncology) Micheline Eleanor BIRCH, NP as Nurse Practitioner (Gynecologic Oncology) Viktoria [name] SAUNDERS, MD as Consulting Physician (Gynecologic Oncology) Leora Lynwood SAUNDERS, MD as Consulting Physician (Orthopedic Surgery)  I have updated your Care Teams any recent Medical  Services you may have received from other providers in the past year.     Assessment:   This is a routine wellness examination for [name] [name].  Hearing/Vision screen Hearing Screening - Comments:: Patient has some trouble with left ear  Vision Screening - Comments:: Patient wears glasses or contacts    Goals Addressed             This Visit's Progress    Patient Stated   On track    Stay active        Depression Screen     06/21/2024    3:23 PM 06/18/2024    2:00 PM 06/06/2024   11:46 AM 05/14/2024   10:00 AM 04/16/2024    8:18 AM 01/17/2024   10:37 AM 10/11/2023    8:55 AM  PHQ 2/9 Scores  PHQ - 2 Score 0 0 0 0 0 2 2  PHQ- 9 Score 3     14 6     Fall Risk     06/21/2024    3:20 PM 01/17/2024   10:37 AM 10/11/2023    8:58 AM 07/08/2023    2:43 PM 07/08/2023    2:42 PM  Fall Risk   Falls in the past year? 0 0 0 1 1  Number falls in past yr: 0 0 0 0 0  Injury with Fall? 0 0 0 0   Risk for fall due to : No Fall Risks No Fall Risks No Fall Risks Other (Comment) History of fall(s);Other (Comment)  Follow up Falls evaluation completed Falls evaluation completed Falls evaluation completed Falls evaluation completed Falls evaluation completed    MEDICARE RISK AT HOME:  Medicare Risk at Home Any stairs  in or around the home?: No If so, are there any without handrails?: No Home free of loose throw rugs in walkways, pet beds, electrical cords, etc?: Yes Adequate lighting in your home to reduce risk of falls?: Yes Life alert?: No Use of a cane, walker or w/c?: No Grab bars in the bathroom?: No Shower chair or bench in shower?: Yes Elevated toilet seat or a handicapped toilet?: No  TIMED UP AND GO:  Was the test performed?  No  Cognitive Function: 6CIT completed        06/21/2024    3:24 PM 05/04/2023    3:34 PM  6CIT Screen  What Year? 0 points 0 points  What month? 0 points 0 points  What time? 0 points 0 points  Count back from 20 0 points 0 points  Months in reverse 0  points 0 points  Repeat phrase 0 points 2 points  Total Score 0 points 2 points    Immunizations Immunization History  Administered Date(s) Administered   INFLUENZA, HIGH DOSE SEASONAL PF 08/26/2015   Influenza Split 07/02/2011, 07/05/2012   Influenza Whole 08/04/2009   Influenza,inj,Quad PF,6+ Mos 09/11/2013, 08/02/2014   Pneumococcal Conjugate-13 03/08/2016   Pneumococcal Polysaccharide-23 10/18/2014   Td 10/18/2014    Screening Tests Health Maintenance  Topic Date Due   Zoster Vaccines- Shingrix (1 of 2) Never done   Pneumococcal Vaccine: 50+ Years (3 of 3 - PCV20 or PCV21) 10/19/2019   Influenza Vaccine  05/04/2024   DTaP/Tdap/Td (2 - Tdap) 10/18/2024   Medicare Annual Wellness (AWV)  06/21/2025   Colonoscopy  04/24/2027   DEXA SCAN  Completed   Hepatitis C Screening  Completed   HPV VACCINES  Aged Out   Meningococcal B Vaccine  Aged Out   Mammogram  Discontinued   COVID-19 Vaccine  Discontinued    Health Maintenance Items Addressed:patient declined vaccinations    Additional Screening:  Vision Screening: Recommended annual ophthalmology exams for early detection of glaucoma and other disorders of the eye. Is the patient up to date with their annual eye exam?  No  Who is the provider or what is the name of the office in which the patient attends annual eye exams?   Dental Screening: Recommended annual dental exams for proper oral hygiene  Community Resource Referral / Chronic Care Management: CRR required this visit?  No   CCM required this visit?  No   Plan:    I have personally reviewed and noted the following in the patient's chart:   Medical and social history Use of alcohol, tobacco or illicit drugs  Current medications and supplements including opioid prescriptions. Patient is not currently taking opioid prescriptions. Functional ability and status Nutritional status Physical activity Advanced directives List of other  physicians Hospitalizations, surgeries, and ER visits in previous 12 months Vitals Screenings to include cognitive, depression, and falls Referrals and appointments  In addition, I have reviewed and discussed with patient certain preventive protocols, quality metrics, and best practice recommendations. A written personalized care plan for preventive services as well as general preventive health recommendations were provided to patient.   Lyle MARLA Right, [place]   06/21/2024   After Visit Summary: (MyChart) Due to this being a telephonic visit, the after visit summary with patients personalized plan was offered to patient via MyChart   Notes: Nothing significant to report at this time.

## 2024-06-21 NOTE — Telephone Encounter (Signed)
 Oral Oncology Patient Advocate Encounter  Patient's daughter, Lenward, called back and stated she prefers Oral Chemo Clinic speak to the patient directly.   Loray Akard ([name]) [name], CPhT  [facility], [name], [facility] - Oral Chemotherapy Patient Advocate Specialist III Phone: [phone]  Fax: [phone]

## 2024-06-21 NOTE — Patient Instructions (Signed)
 [name] [name],  Thank you for taking the time for your Medicare Wellness Visit. I appreciate your continued commitment to your health goals. Please review the care plan we discussed, and feel free to reach out if I can assist you further.  Medicare recommends these wellness visits once per year to help you and your care team stay ahead of potential health issues. These visits are designed to focus on prevention, allowing your provider to concentrate on managing your acute and chronic conditions during your regular appointments.  Please note that Annual Wellness Visits do not include a physical exam. Some assessments may be limited, especially if the visit was conducted virtually. If needed, we may recommend a separate in-person follow-up with your provider.  Ongoing Care Seeing your primary care provider every 3 to 6 months helps us  monitor your health and provide consistent, personalized care.   Referrals If a referral was made during today's visit and you haven't received any updates within two weeks, please contact the referred provider directly to check on the status.  Recommended Screenings:  Health Maintenance  Topic Date Due   Zoster (Shingles) Vaccine (1 of 2) Never done   Pneumococcal Vaccine for age over 77 (3 of 3 - PCV20 or PCV21) 10/19/2019   Flu Shot  05/04/2024   DTaP/Tdap/Td vaccine (2 - Tdap) 10/18/2024   Medicare Annual Wellness Visit  06/21/2025   Colon Cancer Screening  04/24/2027   DEXA scan (bone density measurement)  Completed   Hepatitis C Screening  Completed   HPV Vaccine  Aged Out   Meningitis B Vaccine  Aged Out   Breast Cancer Screening  Discontinued   COVID-19 Vaccine  Discontinued       06/21/2024    3:17 PM  Advanced Directives  Does Patient Have a Medical Advance Directive? No  Does patient want to make changes to medical advance directive? No - Patient declined  Would patient like information on creating a medical advance directive? No - Patient  declined   Advance Care Planning is important because it: Ensures you receive medical care that aligns with your values, goals, and preferences. Provides guidance to your family and loved ones, reducing the emotional burden of decision-making during critical moments.  Vision: Annual vision screenings are recommended for early detection of glaucoma, cataracts, and diabetic retinopathy. These exams can also reveal signs of chronic conditions such as diabetes and high blood pressure.  Dental: Annual dental screenings help detect early signs of oral cancer, gum disease, and other conditions linked to overall health, including heart disease and diabetes.  Please see the attached documents for additional preventive care recommendations.

## 2024-06-22 ENCOUNTER — Other Ambulatory Visit: Payer: Self-pay

## 2024-06-22 ENCOUNTER — Other Ambulatory Visit (HOSPITAL_COMMUNITY): Payer: Self-pay

## 2024-06-22 ENCOUNTER — Encounter: Payer: Self-pay | Admitting: Oncology

## 2024-06-22 ENCOUNTER — Other Ambulatory Visit: Payer: Self-pay | Admitting: Pharmacy Technician

## 2024-06-22 ENCOUNTER — Telehealth: Payer: Self-pay | Admitting: Pharmacy Technician

## 2024-06-22 NOTE — Telephone Encounter (Signed)
 Oral Oncology Patient Advocate Encounter  Patient successfully OnBoarded and drug education provided by pharmacist. Medication scheduled to be filled on 09/22 for pickup on 09/22 from [facility]. Patient also knows to call me at [phone] with any questions or concerns regarding receiving medication or if there is any unexpected change in co-pay.     [name] [name] ([name]) [name], CPhT  [facility], [name], [facility] - Oral Chemotherapy Patient Advocate Specialist III Phone: [phone]  Fax: [phone]

## 2024-06-22 NOTE — Progress Notes (Signed)
 Patient education documented in EPIC note on 06/22/24.

## 2024-06-22 NOTE — Telephone Encounter (Signed)
 Clinical Pharmacist Practitioner Encounter   Patient will pick up medication from [facility]  on 06/25/24.   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 Planned start 06/25/24.   Treatment goal: Palliative  Counseled patient on administration, dosing, side effects, monitoring, drug-food interactions, safe handling, storage, and disposal. Patient will take 1 capsule (50 mg total) by mouth daily. Take for 21 days, then hold for 7 days. Repeat every 28 days.   Side effects include but not limited to: decreased wbc/hgb/plt, nausea. Nausea: patient has antiemetics handy to use as needed    Reviewed with patient importance of keeping a medication schedule and plan for any missed doses.  After discussion with patient no patient barriers to medication adherence identified.   Distress evaluation: Distress thermometer not completed during telephone call as patient has been on previous lines of therapy.   Communication and Learning Assessment Primary learner: patient Barriers to learning: No barriers Preferred language: English Learning preferences: Listening Reading   Ms. Gronau voiced understanding and appreciation. All questions answered. Medication handout provided.  Provided patient with Oral Chemotherapy Navigation Clinic phone number. Patient knows to call the office with questions or concerns. Oral Chemotherapy Navigation Clinic will continue to follow.  Jannetta Massey N. Ardon Franklin, PharmD, BCOP, CPP Hematology/Oncology Clinical Pharmacist [facility]/AP Oral Chemotherapy Navigation Clinic [phone]  06/22/2024 1:32 PM

## 2024-06-22 NOTE — Progress Notes (Signed)
 Specialty Pharmacy Initial Fill Coordination Note  [name] [name] is a 73 y.o. female contacted today regarding refills of specialty medication(s) Etoposide  (VEPESID ) .  Patient requested [name] at [facility] at [place]  on 06/25/24   Medication will be filled on 09/22.   Patient is aware of $0 copayment. Leisure centre manager.  Lilie Vezina ([name]) [name], CPhT  [facility], [name], [facility] - Oral Chemotherapy Patient Advocate Specialist III Phone: [phone]  Fax: [phone]

## 2024-06-23 ENCOUNTER — Other Ambulatory Visit: Payer: Self-pay | Admitting: Oncology

## 2024-06-25 ENCOUNTER — Ambulatory Visit

## 2024-06-25 ENCOUNTER — Other Ambulatory Visit: Payer: Self-pay

## 2024-06-25 ENCOUNTER — Inpatient Hospital Stay

## 2024-06-25 ENCOUNTER — Inpatient Hospital Stay (HOSPITAL_BASED_OUTPATIENT_CLINIC_OR_DEPARTMENT_OTHER): Admitting: Oncology

## 2024-06-25 ENCOUNTER — Inpatient Hospital Stay: Admitting: Nutrition

## 2024-06-25 DIAGNOSIS — D701 Agranulocytosis secondary to cancer chemotherapy: Secondary | ICD-10-CM | POA: Diagnosis not present

## 2024-06-25 DIAGNOSIS — G620 Drug-induced polyneuropathy: Secondary | ICD-10-CM | POA: Diagnosis not present

## 2024-06-25 DIAGNOSIS — C569 Malignant neoplasm of unspecified ovary: Secondary | ICD-10-CM | POA: Diagnosis not present

## 2024-06-25 DIAGNOSIS — C786 Secondary malignant neoplasm of retroperitoneum and peritoneum: Secondary | ICD-10-CM | POA: Diagnosis not present

## 2024-06-25 MED ORDER — ONDANSETRON HCL 8 MG PO TABS: 8.0000 mg | ORAL_TABLET | Freq: Three times a day (TID) | ORAL | 2 refills | Status: AC | PRN

## 2024-06-25 MED ORDER — PROCHLORPERAZINE MALEATE 10 MG PO TABS: 10.0000 mg | ORAL_TABLET | Freq: Four times a day (QID) | ORAL | 2 refills | Status: AC | PRN

## 2024-06-25 NOTE — Progress Notes (Signed)
 Nutrition follow up completed with patient receiving Etoposide  for Ovarian cancer. She is followed by Dr. Cloretta.  Weight improved and documented as 140 pounds 8 oz from 136 pounds 9.6 oz August 11.  Labs and medications reviewed.  Patient feels well and denies nutrition impact symptoms. She continues to enjoy ONS and requests coupons and samples today. No ascites.  Food and Nutrition Related Knowledge Deficit resolved.  Provided samples and coupons. Encouraged patient to reach out for further needs. She has my contact information.

## 2024-06-25 NOTE — Progress Notes (Signed)
 [facility] OFFICE PROGRESS NOTE   Diagnosis: Ovarian cancer  INTERVAL HISTORY:   [name] [name] returns as scheduled.  She feels well.  She continues to have left eye symptoms.  She is scheduled to see ophthalmology later this week.  She plans to begin etoposide  today.  No abdominal swelling.  Objective:  Vital signs in last 24 hours:  Blood pressure 112/78, pulse 77, temperature 98.1 F (36.7 C), temperature source Temporal, resp. rate 18, height 5' 4 (1.626 m), weight 140 lb 8 oz (63.7 kg), SpO2 97%.    HEENT: No thrush or ulcers, mild left conjunctival erythema Resp: Lungs clear bilaterally Cardio: Regular rate and rhythm GI: No apparent ascites, no mass, no hepatosplenomegaly, tender in the right lower abdomen Vascular: No leg edema  Lab Results:  Lab Results  Component Value Date   WBC 6.2 06/06/2024   HGB 13.7 06/06/2024   HCT 42.5 06/06/2024   MCV 96.8 06/06/2024   PLT 247 06/06/2024   NEUTROABS 4.0 06/06/2024    CMP  Lab Results  Component Value Date   NA 141 06/06/2024   K 4.4 06/06/2024   CL 105 06/06/2024   CO2 24 06/06/2024   GLUCOSE 78 06/06/2024   BUN 22 06/06/2024   CREATININE 0.64 06/06/2024   CALCIUM  9.6 06/06/2024   PROT 7.0 06/06/2024   ALBUMIN 4.0 06/06/2024   AST 38 06/06/2024   ALT 39 06/06/2024   ALKPHOS 79 06/06/2024   BILITOT 0.4 06/06/2024   GFRNONAA >60 06/06/2024   GFRAA >60 03/13/2020    Lab Results  Component Value Date   CEA 0.8 07/13/2012   CA125 8 08/18/2016    Lab Results  Component Value Date   INR 1.0 11/02/2022   LABPROT 12.9 11/02/2022    Imaging:  No results found.  Medications: I have reviewed the patient's current medications.   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 A 5 mm nodules remain on the right diaphragm.  - TumorNext paired germline/tumor analyses: No somatic variants detected, germline CHEK2      VUS      Cycle 1 of adjuvant Taxol /carboplatin  chemotherapy initiated on 09/19/2012.   The CA 125 normalized.   She completed day 15 of cycle 6 on 02/06/2013.   Restaging CT evaluation 03/29/2013 showed no evidence of metastatic disease in the chest. There was marked improvement in appearance/resolution of previous described peritoneal/omental disease. There was no convincing evidence of residual disease. There was minimal increased density in the region of the omentum favored to be treatment-related. There was no pelvic adenopathy. CA125 3.2 on 02/19/2014. 08/18/2016 CA-125 8 CT abdomen/pelvis 08/25/2016-solitary new enlarged right external iliac lymph node measuring 2.2 cm. Biopsy 09/07/2016-adenocarcinoma consistent with high-grade serous carcinoma.  ER +90%, PR -0%, HER2 negative (0); foundation 1-HRD not detected, microsatellite status cannot be determined, tumor mutation burden 8, FOLR1 positive-80%.   PET scan 09/22/2016-malignant range FDG uptake associated with the enlarged right external iliac lymph node compatible with metastatic adenopathy.  No additional sites of metastatic disease identified. Radiation 10/25/2016-12/01/2016 PET scan 03/28/2017-previously enlarged hypermetabolic right external iliac node-normal in size with resolution of hypermetabolic activity, no active malignancy identified PET scan 05/15/2018-no evidence of recurrent or metastatic disease, no hypermetabolic lymph nodes CT abdomen/pelvis 09/19/2019-mild increase in the size of several left upper quadrant peritoneal nodules measuring up to 11 mm.  No other sites of metastatic disease identified.  No ascites. CT abdomen/pelvis 03/14/2020-slight enlargement of several left upper quadrant peritoneal nodules, no ascites, no other evidence of disease progression CT abdomen/pelvis 09/12/2020 peritoneal nodularity/omental caking  predominantly in the left upper/mid abdomen, mildly progressive.  Largest implant in the left upper abdomen adjacent to the stomach now measures 17 mm, previously 13 mm.  Overall volume of peritoneal disease has mildly progressed. CT abdomen/pelvis 01/13/2021- 4 mm right ureteral calculus with moderate right hydronephrosis and upper right hydroureter, progressive omental nodularity, stable calcified and partially calcified right pelvic nodules CT abdomen/pelvis 05/13/2021-enlargement of left upper quadrant omental nodules and a peritoneal nodule at the right iliac fossa, no ascites CT abdomen/pelvis 08/14/2021-mild increase in size of omental nodules, no ascites, no new site of metastatic disease Taxol /carboplatin  09/09/2021, 09/15/2021, 09/22/2021 Taxol /carboplatin  10/07/2021, 10/13/2021, 10/20/2021 Taxol /Carboplatin  11/03/2021, 11/10/2021, 11/17/2021 CT 11/23/2021-decrease in peritoneal metastases, no new or progressive disease, airspace opacity at both lung bases Taxol /carboplatin  12/01/2021, 12/08/2021, 12/15/2021 Taxol /Carboplatin  every 2 weeks beginning 12/29/2021 Carboplatin  alone 01/26/2022, Taxol  held due to neuropathy Carboplatin  alone 02/09/2022, Taxol  held due to neuropathy Carboplatin  alone 02/23/2022, Taxol  held due to neuropathy Carboplatin  alone 03/09/2022, Taxol  held due to neuropathy CTs 03/25/2022-no significant change in omental, mesenteric disease.  No new or clearly progressive findings.  Previously noted patchy areas of consolidative opacity at the lung bases have resolved. Treatment break beginning 03/26/2022 CT/pelvis 08/10/2022-increased size of peritoneal nodules, increased small volume ascites, mild right lower lobe pleural nodularity Cycle 1 salvage carboplatin  09/14/2022 (weekly x 3 followed by 1 week break) Cycle 2 salvage carboplatin  10/12/2022 (weekly x 3 followed by 1 week break) Cycle 3 salvage carboplatin  11/16/2022 (weekly x 3 followed by 1 week break) CT abdomen/pelvis 12/08/2022-mild  increase in peritoneal carcinomatosis Tamoxifen  20 mg daily 01/07/2023 CT abdomen/pelvis 03/29/2023-increased ascites with persistent diffuse peritoneal nodularity, largest omental implants have decreased in size.  No enlarging implants.,  No evidence of bowel obstruction Paracentesis for 4.5 L 06/29/2023 CTs 08/02/2023-peritoneal carcinomatosis similar to slightly improved.  Stable volume of ascites.  No evidence of metastatic disease in the chest. Tamoxifen  20 mg daily continued 08/02/2023 Paracentesis 08/25/2023-4 L of fluid removed, cytology shows adenocarcinoma Plan for Doxil  Baseline 2D echo 09/23/2023-LVEF 55 to 60%.  Left ventricle has normal function.  Abnormal strain with apical sparing, follow-up TTE/MRI suggested in 3 months. Cycle 1 Doxil  10/06/2023 Cycle 2 Doxil  11/03/2023 CT abdomen/pelvis 11/16/2023: Compared to 08/02/2023 there is overall stable disease, new 0.9 cm nodule at the left descending colon, I reviewed the CT images.  Multiple lesions appear unchanged.  Stable ascites and peritoneal thickening Cycle 3 Doxil  12/01/2023 Echocardiogram 12/20/2023: LVEF 55-60% Cycle 4 Doxil  12/27/2023 Cycle 5 Doxil  01/24/2024 CTs abdomen/pelvis 02/14/2024-interval increase in the amount of ascites and peritoneal carcinomatosis.  No acute inflammatory process. Cycle 1 mirvetuximab 03/02/2024 Cycle 2 mirvetuximab 03/26/2024 Cycle 3 mirvetuximab 04/16/2024 CT 04/28/2024-diminished volume of extensively loculated fluid throughout the abdomen.  Some peritoneal nodules in the left upper quadrant diminished in size, other nodules unchanged.  Overall findings consistent with slightly improved peritoneal metastatic disease.  No new disease in the abdomen or pelvis. Cycle 4 mirvetuximab 05/14/2024 Cycle 5 mirvetuximab held on 06/06/2024 secondary to ocular toxicity    2. Low abdomen/suprapubic pain prior to the exploratory laparotomy-likely secondary to omental/pelvic tumor; persistent mild pain in the lower  abdomen   3. Chronic neck and back pain.   4. Anxiety -persistent despite Lexapro  and Xanax . She has been evaluated by psychiatry. Currently on Lexapro . 5. Status post Port-A-Cath placement 09/22/2012. The Port-A-Cath was  removed on 04/03/2013.   6. Neutropenia secondary to chemotherapy- day 15 cycle 1 and cycle 3. Taxol /carboplatin  not given secondary to neutropenia.    7. Herpes zoster involving a right thoracic dermatome July 2015. She completed a course of Valtrex . 8. Nodular bony prominence at the left pelvis on rectal exam 06/05/2015-likely a benign finding 9.  Right ureter stone/hydroureteronephrosis on CT 01/13/2021-referred to urology; status post right ureteroscopy with stone extraction and ureteral stent placement.  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  Right hip fracture following a fall 11/02/2022-status post right hip anterior hemiarthroplasty 12.  Acute localized thrombus right proximal femoral vein 11/16/2022 Eliquis  initiated 13.  Admission 06/24/2023 with volume overload     Disposition: [name] [name] has advanced stage ovarian cancer.  She completed 4 cycles of mirvetuximab.  She developed ocular toxicity following cycle 4.  The eye symptoms have improved, but she does not wish to receive further mirvetuximab.  The CA125 is now normal. The plan is to begin oral etoposide  today.  We reviewed potential toxicities associated with etoposide  including the chance of nausea, alopecia, leukemia, and hematologic toxicity.  She agrees to proceed.  She will return for a CBC in 2 weeks and an office visit in 4 weeks.  She will follow-up with ophthalmology later this week.  Arley Hof, MD  06/25/2024  11:42 AM

## 2024-06-25 NOTE — Patient Instructions (Signed)

## 2024-06-25 NOTE — Addendum Note (Signed)
 Addended by: STEVA DEVERE CROME on: 06/25/2024 11:56 AM   Modules accepted: Orders

## 2024-06-26 LAB — CA 125: Cancer Antigen (CA) 125: 7.2 U/mL (ref 0.0–38.1)

## 2024-06-27 DIAGNOSIS — H16223 Keratoconjunctivitis sicca, not specified as Sjogren's, bilateral: Secondary | ICD-10-CM | POA: Diagnosis not present

## 2024-07-03 ENCOUNTER — Ambulatory Visit
Admission: RE | Admit: 2024-07-03 | Discharge: 2024-07-03 | Disposition: A | Discharge status: Home / Self Care | Source: Ambulatory Visit | Attending: Oncology | Admitting: Oncology

## 2024-07-03 DIAGNOSIS — Z1231 Encounter for screening mammogram for malignant neoplasm of breast: Secondary | ICD-10-CM | POA: Diagnosis not present

## 2024-07-03 DIAGNOSIS — Z0000 Encounter for general adult medical examination without abnormal findings: Secondary | ICD-10-CM

## 2024-07-11 ENCOUNTER — Inpatient Hospital Stay

## 2024-07-11 ENCOUNTER — Inpatient Hospital Stay: Attending: Oncology

## 2024-07-11 DIAGNOSIS — M542 Cervicalgia: Secondary | ICD-10-CM | POA: Insufficient documentation

## 2024-07-11 DIAGNOSIS — M549 Dorsalgia, unspecified: Secondary | ICD-10-CM | POA: Insufficient documentation

## 2024-07-11 DIAGNOSIS — C569 Malignant neoplasm of unspecified ovary: Secondary | ICD-10-CM

## 2024-07-11 DIAGNOSIS — G629 Polyneuropathy, unspecified: Secondary | ICD-10-CM | POA: Insufficient documentation

## 2024-07-11 DIAGNOSIS — R180 Malignant ascites: Secondary | ICD-10-CM | POA: Diagnosis not present

## 2024-07-11 DIAGNOSIS — C786 Secondary malignant neoplasm of retroperitoneum and peritoneum: Secondary | ICD-10-CM | POA: Insufficient documentation

## 2024-07-11 DIAGNOSIS — G8929 Other chronic pain: Secondary | ICD-10-CM | POA: Insufficient documentation

## 2024-07-11 LAB — CBC WITH DIFFERENTIAL (CANCER CENTER ONLY)
Abs Immature Granulocytes: 0.02 K/uL (ref 0.00–0.07)
Basophils Absolute: 0 K/uL (ref 0.0–0.1)
Basophils Relative: 1 %
Eosinophils Absolute: 0.1 K/uL (ref 0.0–0.5)
Eosinophils Relative: 2 %
HCT: 39.9 % (ref 36.0–46.0)
Hemoglobin: 13.1 g/dL (ref 12.0–15.0)
Immature Granulocytes: 0 %
Lymphocytes Relative: 18 %
Lymphs Abs: 1 K/uL (ref 0.7–4.0)
MCH: 32 pg (ref 26.0–34.0)
MCHC: 32.8 g/dL (ref 30.0–36.0)
MCV: 97.6 fL (ref 80.0–100.0)
Monocytes Absolute: 0.2 K/uL (ref 0.1–1.0)
Monocytes Relative: 4 %
Neutro Abs: 4.1 K/uL (ref 1.7–7.7)
Neutrophils Relative %: 75 %
Platelet Count: 233 K/uL (ref 150–400)
RBC: 4.09 MIL/uL (ref 3.87–5.11)
RDW: 15 % (ref 11.5–15.5)
WBC Count: 5.4 K/uL (ref 4.0–10.5)
nRBC: 0 % (ref 0.0–0.2)

## 2024-07-11 NOTE — Patient Instructions (Signed)

## 2024-07-12 ENCOUNTER — Other Ambulatory Visit: Payer: Self-pay

## 2024-07-13 ENCOUNTER — Other Ambulatory Visit: Payer: Self-pay

## 2024-07-13 ENCOUNTER — Telehealth: Payer: Self-pay | Admitting: *Deleted

## 2024-07-13 DIAGNOSIS — C569 Malignant neoplasm of unspecified ovary: Secondary | ICD-10-CM

## 2024-07-13 MED ORDER — ETOPOSIDE 50 MG PO CAPS
50.0000 mg | ORAL_CAPSULE | Freq: Every day | ORAL | 0 refills | Status: DC
  Filled 2024-07-13: qty 21, 21d supply, fill #0
  Filled 2024-07-17: qty 21, 28d supply, fill #0

## 2024-07-13 NOTE — Telephone Encounter (Signed)
 Called [name] [name] with her CBC results (normal). She started etoposide  on 9/23 (day 18) and has no adverse effect. OK to reorder next cycle per Dr. Cloretta.

## 2024-07-16 ENCOUNTER — Ambulatory Visit: Admitting: Oncology

## 2024-07-16 ENCOUNTER — Ambulatory Visit

## 2024-07-16 ENCOUNTER — Other Ambulatory Visit

## 2024-07-16 ENCOUNTER — Other Ambulatory Visit: Payer: Self-pay

## 2024-07-17 ENCOUNTER — Other Ambulatory Visit: Payer: Self-pay

## 2024-07-18 ENCOUNTER — Other Ambulatory Visit (HOSPITAL_COMMUNITY): Payer: Self-pay

## 2024-07-19 ENCOUNTER — Other Ambulatory Visit: Payer: Self-pay

## 2024-07-19 ENCOUNTER — Other Ambulatory Visit (HOSPITAL_COMMUNITY): Payer: Self-pay

## 2024-07-19 ENCOUNTER — Other Ambulatory Visit: Payer: Self-pay | Admitting: Pharmacy Technician

## 2024-07-19 NOTE — Progress Notes (Signed)
 Specialty Pharmacy Ongoing Clinical Assessment Note  [name] [name] is a 73 y.o. female who is being followed by the specialty pharmacy service for RxSp Oncology   Patient's specialty medication(s) reviewed today: Etoposide  (VEPESID )   Missed doses in the last 4 weeks: 0   Patient/Caregiver did not have any additional questions or concerns.   Therapeutic benefit summary: Unable to assess   Adverse events/side effects summary: No adverse events/side effects   Patient's therapy is appropriate to: Continue    Goals Addressed             This Visit's Progress    Slow Disease Progression       Patient is unable to be assessed as therapy was recently initiated. Patient will maintain adherence          Follow up: 3 months  Tashiya Souders M Maanya Hippert Specialty Pharmacist

## 2024-07-19 NOTE — Progress Notes (Signed)
 Specialty Pharmacy Refill Coordination Note  [name] [name] is a 73 y.o. female contacted today regarding refills of specialty medication(s) Etoposide  (VEPESID )   Patient requested [name] at [facility] at [place] date: 07/19/24   Medication will be filled on 07/19/24.

## 2024-07-23 ENCOUNTER — Other Ambulatory Visit: Payer: Self-pay

## 2024-07-23 ENCOUNTER — Inpatient Hospital Stay

## 2024-07-23 ENCOUNTER — Inpatient Hospital Stay (HOSPITAL_BASED_OUTPATIENT_CLINIC_OR_DEPARTMENT_OTHER): Admitting: Oncology

## 2024-07-23 VITALS — BP 108/79 | HR 88 | Temp 98.1°F | Resp 18 | Ht 64.0 in | Wt 141.0 lb

## 2024-07-23 DIAGNOSIS — M549 Dorsalgia, unspecified: Secondary | ICD-10-CM | POA: Diagnosis not present

## 2024-07-23 DIAGNOSIS — G8929 Other chronic pain: Secondary | ICD-10-CM | POA: Diagnosis not present

## 2024-07-23 DIAGNOSIS — C786 Secondary malignant neoplasm of retroperitoneum and peritoneum: Secondary | ICD-10-CM | POA: Diagnosis not present

## 2024-07-23 DIAGNOSIS — C569 Malignant neoplasm of unspecified ovary: Secondary | ICD-10-CM

## 2024-07-23 DIAGNOSIS — M542 Cervicalgia: Secondary | ICD-10-CM | POA: Diagnosis not present

## 2024-07-23 DIAGNOSIS — G629 Polyneuropathy, unspecified: Secondary | ICD-10-CM | POA: Diagnosis not present

## 2024-07-23 DIAGNOSIS — R180 Malignant ascites: Secondary | ICD-10-CM | POA: Diagnosis not present

## 2024-07-23 LAB — CMP (CANCER CENTER ONLY)
ALT: 22 U/L (ref 0–44)
AST: 29 U/L (ref 15–41)
Albumin: 4.1 g/dL (ref 3.5–5.0)
Alkaline Phosphatase: 80 U/L (ref 38–126)
Anion gap: 11 (ref 5–15)
BUN: 27 mg/dL — ABNORMAL HIGH (ref 8–23)
CO2: 27 mmol/L (ref 22–32)
Calcium: 9.7 mg/dL (ref 8.9–10.3)
Chloride: 101 mmol/L (ref 98–111)
Creatinine: 0.79 mg/dL (ref 0.44–1.00)
GFR, Estimated: >60 mL/min (ref >60)
Glucose, Bld: 158 mg/dL — ABNORMAL HIGH (ref 70–99)
Potassium: 4.1 mmol/L (ref 3.5–5.1)
Sodium: 139 mmol/L (ref 135–145)
Total Bilirubin: 0.3 mg/dL (ref 0.0–1.2)
Total Protein: 6.9 g/dL (ref 6.5–8.1)

## 2024-07-23 LAB — CBC WITH DIFFERENTIAL (CANCER CENTER ONLY)
Abs Immature Granulocytes: 0.03 K/uL (ref 0.00–0.07)
Basophils Absolute: 0.1 K/uL (ref 0.0–0.1)
Basophils Relative: 1 %
Eosinophils Absolute: 0.1 K/uL (ref 0.0–0.5)
Eosinophils Relative: 1 %
HCT: 40.9 % (ref 36.0–46.0)
Hemoglobin: 13.5 g/dL (ref 12.0–15.0)
Immature Granulocytes: 1 %
Lymphocytes Relative: 21 %
Lymphs Abs: 1.3 K/uL (ref 0.7–4.0)
MCH: 32.4 pg (ref 26.0–34.0)
MCHC: 33 g/dL (ref 30.0–36.0)
MCV: 98.1 fL (ref 80.0–100.0)
Monocytes Absolute: 0.7 K/uL (ref 0.1–1.0)
Monocytes Relative: 11 %
Neutro Abs: 4 K/uL (ref 1.7–7.7)
Neutrophils Relative %: 65 %
Platelet Count: 252 K/uL (ref 150–400)
RBC: 4.17 MIL/uL (ref 3.87–5.11)
RDW: 15.1 % (ref 11.5–15.5)
WBC Count: 6.1 K/uL (ref 4.0–10.5)
nRBC: 0 % (ref 0.0–0.2)

## 2024-07-23 NOTE — Patient Instructions (Signed)

## 2024-07-23 NOTE — Progress Notes (Signed)
 [facility] OFFICE PROGRESS NOTE   Diagnosis: Ovarian cancer  INTERVAL HISTORY:   Ms. Kaner began oral etoposide  06/26/2024.  No nausea, rash, or mouth sores.  She reports rhinorrhea and a sore throat for the past few weeks.  She has a mild cough.  She continues to have blurred vision and dryness in the left eye.  She plans to schedule a follow-up eye appointment.  Objective:  Vital signs in last 24 hours:  Blood pressure 108/79, pulse 88, temperature 98.1 F (36.7 C), temperature source Temporal, resp. rate 18, height 5' 4 (1.626 m), weight 141 lb (64 kg), SpO2 98%.    HEENT: Pharynx without erythema.  No thrush or ulcers Resp: Lungs clear bilaterally Cardio: Regular rate and rhythm GI: No apparent ascites, no hepatosplenomegaly, no mass, mild tenderness in the right lower abdomen Vascular: No leg edema  Lab Results:  Lab Results  Component Value Date   WBC 6.1 07/23/2024   HGB 13.5 07/23/2024   HCT 40.9 07/23/2024   MCV 98.1 07/23/2024   PLT 252 07/23/2024   NEUTROABS 4.0 07/23/2024    CMP  Lab Results  Component Value Date   NA 139 07/23/2024   K 4.1 07/23/2024   CL 101 07/23/2024   CO2 27 07/23/2024   GLUCOSE 158 (H) 07/23/2024   BUN 27 (H) 07/23/2024   CREATININE 0.79 07/23/2024   CALCIUM  9.7 07/23/2024   PROT 6.9 07/23/2024   ALBUMIN 4.1 07/23/2024   AST 29 07/23/2024   ALT 22 07/23/2024   ALKPHOS 80 07/23/2024   BILITOT 0.3 07/23/2024   GFRNONAA >60 07/23/2024   GFRAA >60 03/13/2020    Lab Results  Component Value Date   CEA 0.8 07/13/2012   CA125 8 08/18/2016    Medications: I have reviewed the patient's current medications.   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 A 5 mm nodules remain on the right diaphragm. - TumorNext paired germline/tumor analyses:  No somatic variants detected, germline CHEK2      VUS      Cycle 1 of adjuvant Taxol /carboplatin  chemotherapy initiated on 09/19/2012.   The CA 125 normalized.   She completed day 15 of cycle 6 on 02/06/2013.   Restaging CT evaluation 03/29/2013 showed no evidence of metastatic disease in the chest. There was marked improvement in appearance/resolution of previous described peritoneal/omental disease. There was no convincing evidence of residual disease. There was minimal increased density in the region of the omentum favored to be treatment-related. There was no pelvic adenopathy. CA125 3.2 on 02/19/2014. 08/18/2016 CA-125 8 CT abdomen/pelvis 08/25/2016-solitary new enlarged right external iliac lymph node measuring 2.2 cm. Biopsy 09/07/2016-adenocarcinoma consistent with high-grade serous carcinoma.  ER +90%, PR -0%, HER2 negative (0); foundation 1-HRD not detected, microsatellite status cannot be determined, tumor mutation burden 8, FOLR1 positive-80%.   PET scan 09/22/2016-malignant range FDG uptake associated with the enlarged right external iliac lymph node compatible with metastatic adenopathy.  No additional sites of metastatic disease identified. Radiation 10/25/2016-12/01/2016 PET scan 03/28/2017-previously enlarged hypermetabolic right external iliac node-normal in size with resolution of hypermetabolic activity, no active malignancy identified PET scan 05/15/2018-no evidence of recurrent or metastatic disease, no hypermetabolic lymph nodes CT abdomen/pelvis 09/19/2019-mild increase in the size of several left upper quadrant peritoneal nodules measuring up to 11 mm.  No other sites of metastatic disease identified.  No ascites. CT abdomen/pelvis 03/14/2020-slight enlargement of  several left upper quadrant peritoneal nodules, no ascites, no other evidence of disease progression CT abdomen/pelvis 09/12/2020 peritoneal nodularity/omental caking predominantly in the left upper/mid abdomen, mildly  progressive.  Largest implant in the left upper abdomen adjacent to the stomach now measures 17 mm, previously 13 mm.  Overall volume of peritoneal disease has mildly progressed. CT abdomen/pelvis 01/13/2021- 4 mm right ureteral calculus with moderate right hydronephrosis and upper right hydroureter, progressive omental nodularity, stable calcified and partially calcified right pelvic nodules CT abdomen/pelvis 05/13/2021-enlargement of left upper quadrant omental nodules and a peritoneal nodule at the right iliac fossa, no ascites CT abdomen/pelvis 08/14/2021-mild increase in size of omental nodules, no ascites, no new site of metastatic disease Taxol /carboplatin  09/09/2021, 09/15/2021, 09/22/2021 Taxol /carboplatin  10/07/2021, 10/13/2021, 10/20/2021 Taxol /Carboplatin  11/03/2021, 11/10/2021, 11/17/2021 CT 11/23/2021-decrease in peritoneal metastases, no new or progressive disease, airspace opacity at both lung bases Taxol /carboplatin  12/01/2021, 12/08/2021, 12/15/2021 Taxol /Carboplatin  every 2 weeks beginning 12/29/2021 Carboplatin  alone 01/26/2022, Taxol  held due to neuropathy Carboplatin  alone 02/09/2022, Taxol  held due to neuropathy Carboplatin  alone 02/23/2022, Taxol  held due to neuropathy Carboplatin  alone 03/09/2022, Taxol  held due to neuropathy CTs 03/25/2022-no significant change in omental, mesenteric disease.  No new or clearly progressive findings.  Previously noted patchy areas of consolidative opacity at the lung bases have resolved. Treatment break beginning 03/26/2022 CT/pelvis 08/10/2022-increased size of peritoneal nodules, increased small volume ascites, mild right lower lobe pleural nodularity Cycle 1 salvage carboplatin  09/14/2022 (weekly x 3 followed by 1 week break) Cycle 2 salvage carboplatin  10/12/2022 (weekly x 3 followed by 1 week break) Cycle 3 salvage carboplatin  11/16/2022 (weekly x 3 followed by 1 week break) CT abdomen/pelvis 12/08/2022-mild increase in peritoneal carcinomatosis Tamoxifen  20 mg  daily 01/07/2023 CT abdomen/pelvis 03/29/2023-increased ascites with persistent diffuse peritoneal nodularity, largest omental implants have decreased in size.  No enlarging implants.,  No evidence of bowel obstruction Paracentesis for 4.5 L 06/29/2023 CTs 08/02/2023-peritoneal carcinomatosis similar to slightly improved.  Stable volume of ascites.  No evidence of metastatic disease in the chest. Tamoxifen  20 mg daily continued 08/02/2023 Paracentesis 08/25/2023-4 L of fluid removed, cytology shows adenocarcinoma Plan for Doxil  Baseline 2D echo 09/23/2023-LVEF 55 to 60%.  Left ventricle has normal function.  Abnormal strain with apical sparing, follow-up TTE/MRI suggested in 3 months. Cycle 1 Doxil  10/06/2023 Cycle 2 Doxil  11/03/2023 CT abdomen/pelvis 11/16/2023: Compared to 08/02/2023 there is overall stable disease, new 0.9 cm nodule at the left descending colon, I reviewed the CT images.  Multiple lesions appear unchanged.  Stable ascites and peritoneal thickening Cycle 3 Doxil  12/01/2023 Echocardiogram 12/20/2023: LVEF 55-60% Cycle 4 Doxil  12/27/2023 Cycle 5 Doxil  01/24/2024 CTs abdomen/pelvis 02/14/2024-interval increase in the amount of ascites and peritoneal carcinomatosis.  No acute inflammatory process. Cycle 1 mirvetuximab 03/02/2024 Cycle 2 mirvetuximab 03/26/2024 Cycle 3 mirvetuximab 04/16/2024 CT 04/28/2024-diminished volume of extensively loculated fluid throughout the abdomen.  Some peritoneal nodules in the left upper quadrant diminished in size, other nodules unchanged.  Overall findings consistent with slightly improved peritoneal metastatic disease.  No new disease in the abdomen or pelvis. Cycle 4 mirvetuximab 05/14/2024 Cycle 5 mirvetuximab held on 06/06/2024 secondary to ocular toxicity Cycle 1 oral etoposide  06/26/2024 Cycle 2 oral etoposide  07/24/2024   2. Low abdomen/suprapubic pain prior to the exploratory laparotomy-likely secondary to omental/pelvic tumor; persistent mild pain in the  lower abdomen   3. Chronic neck and back pain.   4. Anxiety -persistent despite Lexapro  and Xanax . She has been evaluated by psychiatry. Currently on Lexapro . 5. Status post Port-A-Cath placement 09/22/2012. The  Port-A-Cath was removed on 04/03/2013.   6. Neutropenia secondary to chemotherapy- day 15 cycle 1 and cycle 3. Taxol /carboplatin  not given secondary to neutropenia.    7. Herpes zoster involving a right thoracic dermatome July 2015. She completed a course of Valtrex . 8. Nodular bony prominence at the left pelvis on rectal exam 06/05/2015-likely a benign finding 9.  Right ureter stone/hydroureteronephrosis on CT 01/13/2021-referred to urology; status post right ureteroscopy with stone extraction and ureteral stent placement.  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  Right hip fracture following a fall 11/02/2022-status post right hip anterior hemiarthroplasty 12.  Acute localized thrombus right proximal femoral vein 11/16/2022 Eliquis  initiated 13.  Admission 06/24/2023 with volume overload     Disposition: Ms. Glazer appears stable.  She tolerated the first cycle of oral etoposide  well.  She will complete cycle 2 beginning 07/24/2024.  She will return for an office and lab visit in 4 weeks.  She will continue follow-up with ophthalmology for the visual symptoms.  Arley Hof, MD  07/23/2024  3:50 PM

## 2024-07-30 DIAGNOSIS — H16223 Keratoconjunctivitis sicca, not specified as Sjogren's, bilateral: Secondary | ICD-10-CM | POA: Diagnosis not present

## 2024-08-08 ENCOUNTER — Ambulatory Visit: Admitting: Nurse Practitioner

## 2024-08-08 VITALS — BP 110/80 | HR 63 | Temp 97.9°F | Ht 63.0 in | Wt 142.2 lb

## 2024-08-08 DIAGNOSIS — Z86718 Personal history of other venous thrombosis and embolism: Secondary | ICD-10-CM

## 2024-08-08 DIAGNOSIS — Z0000 Encounter for general adult medical examination without abnormal findings: Secondary | ICD-10-CM

## 2024-08-08 DIAGNOSIS — E785 Hyperlipidemia, unspecified: Secondary | ICD-10-CM

## 2024-08-08 DIAGNOSIS — L602 Onychogryphosis: Secondary | ICD-10-CM

## 2024-08-08 DIAGNOSIS — M8580 Other specified disorders of bone density and structure, unspecified site: Secondary | ICD-10-CM

## 2024-08-08 DIAGNOSIS — C569 Malignant neoplasm of unspecified ovary: Secondary | ICD-10-CM

## 2024-08-08 DIAGNOSIS — H6122 Impacted cerumen, left ear: Secondary | ICD-10-CM

## 2024-08-08 DIAGNOSIS — H9190 Unspecified hearing loss, unspecified ear: Secondary | ICD-10-CM | POA: Insufficient documentation

## 2024-08-08 DIAGNOSIS — M069 Rheumatoid arthritis, unspecified: Secondary | ICD-10-CM

## 2024-08-08 DIAGNOSIS — E559 Vitamin D deficiency, unspecified: Secondary | ICD-10-CM

## 2024-08-08 LAB — CBC WITH DIFFERENTIAL/PLATELET
Basophils Absolute: 0.1 K/uL (ref 0.0–0.1)
Basophils Relative: 1.1 % (ref 0.0–3.0)
Eosinophils Absolute: 0.1 K/uL (ref 0.0–0.7)
Eosinophils Relative: 2.1 % (ref 0.0–5.0)
HCT: 40.2 % (ref 36.0–46.0)
Hemoglobin: 13.2 g/dL (ref 12.0–15.0)
Lymphocytes Relative: 17.4 % (ref 12.0–46.0)
Lymphs Abs: 0.8 K/uL (ref 0.7–4.0)
MCHC: 32.9 g/dL (ref 30.0–36.0)
MCV: 98.8 fl (ref 78.0–100.0)
Monocytes Absolute: 0.3 K/uL (ref 0.1–1.0)
Monocytes Relative: 6.8 % (ref 3.0–12.0)
Neutro Abs: 3.4 K/uL (ref 1.4–7.7)
Neutrophils Relative %: 72.6 % (ref 43.0–77.0)
Platelets: 239 K/uL (ref 150.0–400.0)
RBC: 4.07 Mil/uL (ref 3.87–5.11)
RDW: 15.3 % (ref 11.5–15.5)
WBC: 4.6 K/uL (ref 4.0–10.5)

## 2024-08-08 LAB — COMPREHENSIVE METABOLIC PANEL WITH GFR
ALT: 21 U/L (ref 0–35)
AST: 23 U/L (ref 0–37)
Albumin: 4.2 g/dL (ref 3.5–5.2)
Alkaline Phosphatase: 61 U/L (ref 39–117)
BUN: 23 mg/dL (ref 6–23)
CO2: 30 meq/L (ref 19–32)
Calcium: 9.1 mg/dL (ref 8.4–10.5)
Chloride: 103 meq/L (ref 96–112)
Creatinine, Ser: 0.67 mg/dL (ref 0.40–1.20)
GFR: 85.92 mL/min (ref >60.00)
Glucose, Bld: 78 mg/dL (ref 70–99)
Potassium: 4.2 meq/L (ref 3.5–5.1)
Sodium: 140 meq/L (ref 135–145)
Total Bilirubin: 0.8 mg/dL (ref 0.2–1.2)
Total Protein: 6.7 g/dL (ref 6.0–8.3)

## 2024-08-08 LAB — LIPID PANEL
Cholesterol: 252 mg/dL — ABNORMAL HIGH (ref 0–200)
HDL: 62 mg/dL (ref >39.00)
LDL Cholesterol: 167 mg/dL — ABNORMAL HIGH (ref 0–99)
NonHDL: 190.02
Total CHOL/HDL Ratio: 4
Triglycerides: 117 mg/dL (ref 0.0–149.0)
VLDL: 23.4 mg/dL (ref 0.0–40.0)

## 2024-08-08 NOTE — Assessment & Plan Note (Signed)
 Currently followed by oncology and maintained on at etoposide .

## 2024-08-08 NOTE — Assessment & Plan Note (Signed)
 History of the same pending new bone density scan.  Patient given information to call and set up at her convenience

## 2024-08-08 NOTE — Assessment & Plan Note (Signed)
 Ambulatory referral to ENT for further evaluation and possible hearing aid intervention

## 2024-08-08 NOTE — Assessment & Plan Note (Signed)
 Verbal consent attained.  Patient was prepped for office policy with cerumen softening eardrops.  Left ear was irrigated.  Partial impaction removed.  Patient tolerated procedure well referral to ENT for further evaluation of impaction

## 2024-08-08 NOTE — Assessment & Plan Note (Signed)
 Ambulatory referral to podiatry for further evaluation

## 2024-08-08 NOTE — Assessment & Plan Note (Signed)
 History of same.  Patient not currently seeking treatment but stable

## 2024-08-08 NOTE — Progress Notes (Signed)
 amb  Established Patient Office Visit  Subjective   Patient ID: [name] [name], female    DOB: [birth year]  Age: 73 y.o. MRN: [id number]  Chief Complaint  Patient presents with   Annual Exam    HPI  Ovarian cancer: Patient currently followed Dr. Audery maintained on etoposide .  GERD: History of same was on Protonix  40 mg daily not on anything currently?  RA: Not currently on medication.  DVT: History of blood clots and active cancer on anticoagulation. Eliquis  5mg  BID   for complete physical and follow up of chronic conditions.  Immunizations: -Tetanus: Completed in 2016 -Influenza: refused  -Shingles:discussed in office. Get at local pharmacy  -Pneumonia: Completed 2017  Diet: Fair diet. She is eat 3 plus meals and some snack. She is drinking water  and green tea, occ zero ginger ale  Exercise: No regular exercise. States that she is doing stuff at home and it the yard  Eye exam: Completes annually. Gets checked because of chemo and was done last week   Dental exam: PRN. Needs updating     Colonoscopy: Completed in 04/23/2022, no repeat colonoscopy due to age and absence of polyps Lung Cancer Screening: NA   Pap smear: 11/08/2014, aged out. Followed by Dr. Viktoria  Mammogram: 07/03/2024, repeat 1 year  DEXA: 04/01/2022, showed osteopenia but high risk for hip fracture at 5.3%.  Sleep: going to bed around 1030 and will get up around 230 am and then will get up and watch tv then will go back to sleep. She ill try to take a nap. She gets up at 530-6   Advanced directive: living will and will go through cancer center to update it as they have a service per her report       Review of Systems  Constitutional:  Negative for chills and fever.  Respiratory:  Negative for shortness of breath.   Cardiovascular:  Negative for chest pain and leg swelling.  Gastrointestinal:  Negative for abdominal pain, blood in stool, constipation, diarrhea, nausea and vomiting.       BM daily  to every other day   Genitourinary:  Negative for dysuria and hematuria.  Neurological:  Positive for dizziness and tingling. Negative for headaches.  Psychiatric/Behavioral:  Negative for hallucinations and suicidal ideas.       Objective:     BP 110/80   Pulse 63   Temp 97.9 F (36.6 C) (Oral)   Ht 5' 3 (1.6 m)   Wt 142 lb 3.2 oz (64.5 kg)   SpO2 95%   BMI 25.19 kg/m  BP Readings from Last 3 Encounters:  08/08/24 110/80  07/23/24 108/79  07/11/24 120/71   Wt Readings from Last 3 Encounters:  08/08/24 142 lb 3.2 oz (64.5 kg)  07/23/24 141 lb (64 kg)  06/25/24 140 lb 8 oz (63.7 kg)   SpO2 Readings from Last 3 Encounters:  08/08/24 95%  07/23/24 98%  07/11/24 97%      Physical Exam Vitals and nursing note reviewed.  Constitutional:      Appearance: Normal appearance.  HENT:     Right Ear: Tympanic membrane, ear canal and external ear normal.     Left Ear: Ear canal and external ear normal. There is impacted cerumen.     Mouth/Throat:     Mouth: Mucous membranes are moist.     Pharynx: Oropharynx is clear.  Eyes:     Extraocular Movements: Extraocular movements intact.     Pupils: Pupils are equal, round, and  reactive to light.  Cardiovascular:     Rate and Rhythm: Normal rate and regular rhythm.     Pulses: Normal pulses.     Heart sounds: Normal heart sounds.  Pulmonary:     Effort: Pulmonary effort is normal.     Breath sounds: Normal breath sounds.  Abdominal:     General: Bowel sounds are normal. There is no distension.     Palpations: There is no mass.     Tenderness: There is no abdominal tenderness.     Hernia: No hernia is present.  Musculoskeletal:     Right lower leg: No edema.     Left lower leg: No edema.  Lymphadenopathy:     Cervical: No cervical adenopathy.  Skin:    General: Skin is warm.  Neurological:     General: No focal deficit present.     Mental Status: She is alert.     Deep Tendon Reflexes:     Reflex Scores:      Bicep  reflexes are 2+ on the right side and 2+ on the left side.      Patellar reflexes are 2+ on the right side and 2+ on the left side.    Comments: Bilateral upper and lower extremity strength 5/5  Psychiatric:        Mood and Affect: Mood normal.        Behavior: Behavior normal.        Thought Content: Thought content normal.        Judgment: Judgment normal.      No results found for any visits on 08/08/24.    The 10-year ASCVD risk score (Arnett DK, et al., 2019) is: 11%    Assessment & Plan:   Problem List Items Addressed This Visit       Endocrine   Ovarian cancer [facility])   Currently followed by oncology and maintained on at etoposide .        Nervous and Auditory   Impacted cerumen of left ear   Verbal consent attained.  Patient was prepped for office policy with cerumen softening eardrops.  Left ear was irrigated.  Partial impaction removed.  Patient tolerated procedure well referral to ENT for further evaluation of impaction      Relevant Orders   Ear Lavage   Ambulatory referral to ENT     Musculoskeletal and Integument   Rheumatoid arthritis (HCC)   History of same.  Patient not currently seeking treatment but stable      Osteopenia   History of the same pending new bone density scan.  Patient given information to call and set up at her convenience      Relevant Orders   DG Bone Density   Hypertrophic toenail   Ambulatory referral to podiatry for further evaluation      Relevant Orders   Ambulatory referral to Podiatry     Other   Vitamin D  deficiency   History of same pending vitamin D  level today      Relevant Orders   VITAMIN D  25 Hydroxy (Vit-D Deficiency, Fractures)   Hyperlipidemia   History of the same.  Pending lipid panel today      Relevant Orders   Lipid panel   Preventative health care - Primary   Discussed age-appropriate immunizations and screening exams.  Did review patient's personal, surgical, social, family histories.   Patient is up-to-date with all age-appropriate vaccinations she would like.  Patient up-to-date on CRC screening, breast cancer screening.  Bone density scan ordered today.  Patient was given information at discharge about preventative healthcare maintenance with anticipatory guidance.      Relevant Orders   Comprehensive metabolic panel with GFR   CBC with Differential/Platelet   TSH   History of DVT of lower extremity   History of same and in the setting of active cancer continue Eliquis  5 mg twice daily      Decreased hearing   Ambulatory referral to ENT for further evaluation and possible hearing aid intervention      Relevant Orders   Ambulatory referral to ENT    Return in about 6 months (around 02/05/2025).    Adina Crandall, NP

## 2024-08-08 NOTE — Assessment & Plan Note (Signed)
 History of same pending vitamin D  level today.

## 2024-08-08 NOTE — Assessment & Plan Note (Signed)
 History of the same.  Pending lipid panel today

## 2024-08-08 NOTE — Assessment & Plan Note (Signed)
 Discussed age-appropriate immunizations and screening exams.  Did review patient's personal, surgical, social, family histories.  Patient is up-to-date with all age-appropriate vaccinations she would like.  Patient up-to-date on CRC screening, breast cancer screening.  Bone density scan ordered today.  Patient was given information at discharge about preventative healthcare maintenance with anticipatory guidance.

## 2024-08-08 NOTE — Patient Instructions (Addendum)
Nice to see you today I will be in touch with the labs once I have them Follow up with me in 6 months, sooner if you need me 

## 2024-08-08 NOTE — Assessment & Plan Note (Signed)
 History of same and in the setting of active cancer continue Eliquis  5 mg twice daily

## 2024-08-09 LAB — VITAMIN D 25 HYDROXY (VIT D DEFICIENCY, FRACTURES): VITD: 16.27 ng/mL — ABNORMAL LOW (ref 30.00–100.00)

## 2024-08-09 LAB — TSH: TSH: 2.18 u[IU]/mL (ref 0.35–5.50)

## 2024-08-10 ENCOUNTER — Ambulatory Visit: Payer: Self-pay | Admitting: Nurse Practitioner

## 2024-08-10 DIAGNOSIS — E559 Vitamin D deficiency, unspecified: Secondary | ICD-10-CM

## 2024-08-10 MED ORDER — VITAMIN D (ERGOCALCIFEROL) 1.25 MG (50000 UNIT) PO CAPS: 50000.0000 [IU] | ORAL_CAPSULE | ORAL | 0 refills | Status: AC

## 2024-08-13 ENCOUNTER — Other Ambulatory Visit: Payer: Self-pay | Admitting: Oncology

## 2024-08-13 ENCOUNTER — Other Ambulatory Visit: Payer: Self-pay

## 2024-08-13 ENCOUNTER — Telehealth: Payer: Self-pay | Admitting: Nurse Practitioner

## 2024-08-13 DIAGNOSIS — C569 Malignant neoplasm of unspecified ovary: Secondary | ICD-10-CM

## 2024-08-13 MED ORDER — ETOPOSIDE 50 MG PO CAPS
50.0000 mg | ORAL_CAPSULE | Freq: Every day | ORAL | 0 refills | Status: DC
  Filled 2024-08-14: qty 21, 28d supply, fill #0

## 2024-08-13 NOTE — Telephone Encounter (Signed)
 Called pt and relayed information. Pt verbalized understanding.  Also relayed lab results to patient. States she will pick up vitamin D  prescription.

## 2024-08-13 NOTE — Telephone Encounter (Signed)
 Copied from CRM [id number]. Topic: Clinical - Request for Lab/Test Order >> Aug 13, 2024  7:49 AM Donna BRAVO wrote: Reason for CRM: patient calling asking about DG Bone Density (Order [id number]) and who will schedule this appt.  Patient would like call back with date and time of appt ok to leave detailed message .  Patient is available  early morning and after 3:00pm

## 2024-08-14 ENCOUNTER — Other Ambulatory Visit: Payer: Self-pay

## 2024-08-15 ENCOUNTER — Other Ambulatory Visit: Payer: Self-pay

## 2024-08-15 ENCOUNTER — Ambulatory Visit: Admitting: Podiatry

## 2024-08-15 NOTE — Progress Notes (Signed)
 Specialty Pharmacy Refill Coordination Note  [name] [name] is a 73 y.o. female contacted today regarding refills of specialty medication(s) Etoposide  (VEPESID )   Patient requested [name] at [facility] at [place] date: 08/20/24   Medication will be filled on: 08/17/24   Patient started last cycle late. Next cycle begins 08/25/24.

## 2024-08-16 ENCOUNTER — Other Ambulatory Visit: Payer: Self-pay

## 2024-08-20 ENCOUNTER — Other Ambulatory Visit: Payer: Self-pay

## 2024-08-20 ENCOUNTER — Inpatient Hospital Stay: Attending: Oncology

## 2024-08-20 ENCOUNTER — Encounter: Payer: Self-pay | Admitting: Nurse Practitioner

## 2024-08-20 ENCOUNTER — Inpatient Hospital Stay: Admitting: Nurse Practitioner

## 2024-08-20 ENCOUNTER — Inpatient Hospital Stay

## 2024-08-20 VITALS — BP 121/76 | HR 76 | Temp 97.5°F | Resp 16 | Ht 63.0 in | Wt 142.8 lb

## 2024-08-20 DIAGNOSIS — C569 Malignant neoplasm of unspecified ovary: Secondary | ICD-10-CM | POA: Diagnosis not present

## 2024-08-20 DIAGNOSIS — C786 Secondary malignant neoplasm of retroperitoneum and peritoneum: Secondary | ICD-10-CM | POA: Insufficient documentation

## 2024-08-20 DIAGNOSIS — Z9079 Acquired absence of other genital organ(s): Secondary | ICD-10-CM | POA: Insufficient documentation

## 2024-08-20 DIAGNOSIS — Z90710 Acquired absence of both cervix and uterus: Secondary | ICD-10-CM | POA: Diagnosis not present

## 2024-08-20 DIAGNOSIS — H538 Other visual disturbances: Secondary | ICD-10-CM | POA: Diagnosis not present

## 2024-08-20 DIAGNOSIS — Z90722 Acquired absence of ovaries, bilateral: Secondary | ICD-10-CM | POA: Diagnosis not present

## 2024-08-20 DIAGNOSIS — Z8543 Personal history of malignant neoplasm of ovary: Secondary | ICD-10-CM | POA: Insufficient documentation

## 2024-08-20 DIAGNOSIS — M8580 Other specified disorders of bone density and structure, unspecified site: Secondary | ICD-10-CM

## 2024-08-20 DIAGNOSIS — Z79899 Other long term (current) drug therapy: Secondary | ICD-10-CM | POA: Insufficient documentation

## 2024-08-20 LAB — CMP (CANCER CENTER ONLY)
ALT: 19 U/L (ref 0–44)
AST: 25 U/L (ref 15–41)
Albumin: 4.2 g/dL (ref 3.5–5.0)
Alkaline Phosphatase: 71 U/L (ref 38–126)
Anion gap: 9 (ref 5–15)
BUN: 21 mg/dL (ref 8–23)
CO2: 27 mmol/L (ref 22–32)
Calcium: 9.7 mg/dL (ref 8.9–10.3)
Chloride: 105 mmol/L (ref 98–111)
Creatinine: 0.63 mg/dL (ref 0.44–1.00)
GFR, Estimated: >60 mL/min (ref >60)
Glucose, Bld: 67 mg/dL — ABNORMAL LOW (ref 70–99)
Potassium: 4.1 mmol/L (ref 3.5–5.1)
Sodium: 141 mmol/L (ref 135–145)
Total Bilirubin: 0.3 mg/dL (ref 0.0–1.2)
Total Protein: 6.7 g/dL (ref 6.5–8.1)

## 2024-08-20 LAB — CBC WITH DIFFERENTIAL (CANCER CENTER ONLY)
Abs Immature Granulocytes: 0.02 K/uL (ref 0.00–0.07)
Basophils Absolute: 0.1 K/uL (ref 0.0–0.1)
Basophils Relative: 1 %
Eosinophils Absolute: 0.1 K/uL (ref 0.0–0.5)
Eosinophils Relative: 3 %
HCT: 37.2 % (ref 36.0–46.0)
Hemoglobin: 12.4 g/dL (ref 12.0–15.0)
Immature Granulocytes: 1 %
Lymphocytes Relative: 21 %
Lymphs Abs: 0.9 K/uL (ref 0.7–4.0)
MCH: 33.6 pg (ref 26.0–34.0)
MCHC: 33.3 g/dL (ref 30.0–36.0)
MCV: 100.8 fL — ABNORMAL HIGH (ref 80.0–100.0)
Monocytes Absolute: 0.4 K/uL (ref 0.1–1.0)
Monocytes Relative: 9 %
Neutro Abs: 2.9 K/uL (ref 1.7–7.7)
Neutrophils Relative %: 65 %
Platelet Count: 257 K/uL (ref 150–400)
RBC: 3.69 MIL/uL — ABNORMAL LOW (ref 3.87–5.11)
RDW: 15.8 % — ABNORMAL HIGH (ref 11.5–15.5)
WBC Count: 4.4 K/uL (ref 4.0–10.5)
nRBC: 0 % (ref 0.0–0.2)

## 2024-08-20 NOTE — Progress Notes (Signed)
 [facility] OFFICE PROGRESS NOTE   Diagnosis: Ovarian cancer  INTERVAL HISTORY:   [name] [name] returns as scheduled.  She completed cycle 2 oral Etoposide  beginning 07/24/2024.  She did nausea/vomiting.  No mouth sores.  No diarrhea.  Appetite is good.  No pain except occasional at the right lower abdomen.  She continues to note blurry vision affecting the left eye.  Objective:  Vital signs in last 24 hours:  Blood pressure 121/76, pulse 76, temperature (!) 97.5 F (36.4 C), temperature source Temporal, resp. rate 16, height 5' 3 (1.6 m), weight 142 lb 12.8 oz (64.8 kg), SpO2 99%.    HEENT: No thrush or ulcers. Resp: Lungs clear bilaterally. Cardio: Regular rate and rhythm. GI: No hepatosplenomegaly.  No mass.  No apparent ascites.  Mild tenderness right lower abdomen. Vascular: No leg edema. Port-A-Cath without erythema.  Lab Results:  Lab Results  Component Value Date   WBC 4.4 08/20/2024   HGB 12.4 08/20/2024   HCT 37.2 08/20/2024   MCV 100.8 (H) 08/20/2024   PLT 257 08/20/2024   NEUTROABS 2.9 08/20/2024    Imaging:  No results found.  Medications: I have reviewed the patient's current medications.  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 A 5 mm nodules remain on the right diaphragm. - TumorNext paired germline/tumor analyses: No somatic variants detected, germline CHEK2      VUS      Cycle 1 of adjuvant Taxol /carboplatin  chemotherapy initiated on 09/19/2012.   The CA 125 normalized.   She completed day 15 of cycle 6 on 02/06/2013.   Restaging CT evaluation 03/29/2013 showed no evidence of metastatic disease in the chest. There was marked improvement in appearance/resolution of previous described peritoneal/omental disease. There was no convincing evidence of residual disease.  There was minimal increased density in the region of the omentum favored to be treatment-related. There was no pelvic adenopathy. CA125 3.2 on 02/19/2014. 08/18/2016 CA-125 8 CT abdomen/pelvis 08/25/2016-solitary new enlarged right external iliac lymph node measuring 2.2 cm. Biopsy 09/07/2016-adenocarcinoma consistent with high-grade serous carcinoma.  ER +90%, PR -0%, HER2 negative (0); foundation 1-HRD not detected, microsatellite status cannot be determined, tumor mutation burden 8, FOLR1 positive-80%.   PET scan 09/22/2016-malignant range FDG uptake associated with the enlarged right external iliac lymph node compatible with metastatic adenopathy.  No additional sites of metastatic disease identified. Radiation 10/25/2016-12/01/2016 PET scan 03/28/2017-previously enlarged hypermetabolic right external iliac node-normal in size with resolution of hypermetabolic activity, no active malignancy identified PET scan 05/15/2018-no evidence of recurrent or metastatic disease, no hypermetabolic lymph nodes CT abdomen/pelvis 09/19/2019-mild increase in the size of several left upper quadrant peritoneal nodules measuring up to 11 mm.  No other sites of metastatic disease identified.  No ascites. CT abdomen/pelvis 03/14/2020-slight enlargement of several left upper quadrant peritoneal nodules, no ascites, no other evidence of disease progression CT abdomen/pelvis 09/12/2020 peritoneal nodularity/omental caking predominantly in the left upper/mid abdomen, mildly progressive.  Largest implant in the left upper abdomen adjacent to the stomach now measures 17 mm, previously 13 mm.  Overall volume of peritoneal disease has mildly progressed. CT abdomen/pelvis 01/13/2021- 4 mm right ureteral calculus with moderate right hydronephrosis and upper right hydroureter, progressive omental nodularity, stable calcified and partially calcified right pelvic nodules CT abdomen/pelvis 05/13/2021-enlargement of left upper quadrant  omental nodules and a peritoneal nodule at the right iliac fossa, no ascites CT abdomen/pelvis 08/14/2021-mild increase  in size of omental nodules, no ascites, no new site of metastatic disease Taxol /carboplatin  09/09/2021, 09/15/2021, 09/22/2021 Taxol /carboplatin  10/07/2021, 10/13/2021, 10/20/2021 Taxol /Carboplatin  11/03/2021, 11/10/2021, 11/17/2021 CT 11/23/2021-decrease in peritoneal metastases, no new or progressive disease, airspace opacity at both lung bases Taxol /carboplatin  12/01/2021, 12/08/2021, 12/15/2021 Taxol /Carboplatin  every 2 weeks beginning 12/29/2021 Carboplatin  alone 01/26/2022, Taxol  held due to neuropathy Carboplatin  alone 02/09/2022, Taxol  held due to neuropathy Carboplatin  alone 02/23/2022, Taxol  held due to neuropathy Carboplatin  alone 03/09/2022, Taxol  held due to neuropathy CTs 03/25/2022-no significant change in omental, mesenteric disease.  No new or clearly progressive findings.  Previously noted patchy areas of consolidative opacity at the lung bases have resolved. Treatment break beginning 03/26/2022 CT/pelvis 08/10/2022-increased size of peritoneal nodules, increased small volume ascites, mild right lower lobe pleural nodularity Cycle 1 salvage carboplatin  09/14/2022 (weekly x 3 followed by 1 week break) Cycle 2 salvage carboplatin  10/12/2022 (weekly x 3 followed by 1 week break) Cycle 3 salvage carboplatin  11/16/2022 (weekly x 3 followed by 1 week break) CT abdomen/pelvis 12/08/2022-mild increase in peritoneal carcinomatosis Tamoxifen  20 mg daily 01/07/2023 CT abdomen/pelvis 03/29/2023-increased ascites with persistent diffuse peritoneal nodularity, largest omental implants have decreased in size.  No enlarging implants.,  No evidence of bowel obstruction Paracentesis for 4.5 L 06/29/2023 CTs 08/02/2023-peritoneal carcinomatosis similar to slightly improved.  Stable volume of ascites.  No evidence of metastatic disease in the chest. Tamoxifen  20 mg daily continued 08/02/2023 Paracentesis  08/25/2023-4 L of fluid removed, cytology shows adenocarcinoma Plan for Doxil  Baseline 2D echo 09/23/2023-LVEF 55 to 60%.  Left ventricle has normal function.  Abnormal strain with apical sparing, follow-up TTE/MRI suggested in 3 months. Cycle 1 Doxil  10/06/2023 Cycle 2 Doxil  11/03/2023 CT abdomen/pelvis 11/16/2023: Compared to 08/02/2023 there is overall stable disease, new 0.9 cm nodule at the left descending colon, I reviewed the CT images.  Multiple lesions appear unchanged.  Stable ascites and peritoneal thickening Cycle 3 Doxil  12/01/2023 Echocardiogram 12/20/2023: LVEF 55-60% Cycle 4 Doxil  12/27/2023 Cycle 5 Doxil  01/24/2024 CTs abdomen/pelvis 02/14/2024-interval increase in the amount of ascites and peritoneal carcinomatosis.  No acute inflammatory process. Cycle 1 mirvetuximab 03/02/2024 Cycle 2 mirvetuximab 03/26/2024 Cycle 3 mirvetuximab 04/16/2024 CT 04/28/2024-diminished volume of extensively loculated fluid throughout the abdomen.  Some peritoneal nodules in the left upper quadrant diminished in size, other nodules unchanged.  Overall findings consistent with slightly improved peritoneal metastatic disease.  No new disease in the abdomen or pelvis. Cycle 4 mirvetuximab 05/14/2024 Cycle 5 mirvetuximab held on 06/06/2024 secondary to ocular toxicity Cycle 1 oral etoposide  06/26/2024 Cycle 2 oral etoposide  07/24/2024 Cycle 3 oral Etoposide  08/21/2024   2. Low abdomen/suprapubic pain prior to the exploratory laparotomy-likely secondary to omental/pelvic tumor; persistent mild pain in the lower abdomen   3. Chronic neck and back pain.   4. Anxiety -persistent despite Lexapro  and Xanax . She has been evaluated by psychiatry. Currently on Lexapro . 5. Status post Port-A-Cath placement 09/22/2012. The Port-A-Cath was removed on 04/03/2013.   6. Neutropenia secondary to chemotherapy- day 15 cycle 1 and cycle 3. Taxol /carboplatin  not given secondary to neutropenia.    7. Herpes zoster involving a right  thoracic dermatome July 2015. She completed a course of Valtrex . 8. Nodular bony prominence at the left pelvis on rectal exam 06/05/2015-likely a benign finding 9.  Right ureter stone/hydroureteronephrosis on CT 01/13/2021-referred to urology; status post right ureteroscopy with stone extraction and ureteral stent placement.  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  Right hip fracture following a fall 11/02/2022-status post right hip anterior hemiarthroplasty 12.  Acute localized thrombus right proximal femoral vein 11/16/2022 Eliquis  initiated 13.  Admission 06/24/2023 with volume overload    Disposition: [name] [name] appears stable.  She has completed 2 cycles of oral Etoposide .  She is tolerating treatment well.  She will begin the next cycle 08/21/2024.  Plan for restaging CTs prior to next office visit.  CBC and chemistry panel reviewed.  We will follow-up on the CA125.  She continues follow-up with ophthalmology.  She will return for lab and follow-up in 4 weeks.  We are available to see her sooner if needed.    Olam Ned ANP/GNP-BC   08/20/2024  9:40 AM

## 2024-08-20 NOTE — Patient Instructions (Signed)

## 2024-08-21 LAB — CA 125: Cancer Antigen (CA) 125: 8 U/mL (ref 0.0–38.1)

## 2024-08-23 ENCOUNTER — Ambulatory Visit: Admitting: Podiatry

## 2024-08-24 ENCOUNTER — Encounter: Payer: Self-pay | Admitting: Gynecologic Oncology

## 2024-08-24 ENCOUNTER — Inpatient Hospital Stay: Admitting: Gynecologic Oncology

## 2024-08-24 VITALS — BP 120/64 | HR 86 | Temp 97.8°F | Resp 19 | Wt 144.4 lb

## 2024-08-24 DIAGNOSIS — C786 Secondary malignant neoplasm of retroperitoneum and peritoneum: Secondary | ICD-10-CM

## 2024-08-24 DIAGNOSIS — Z79899 Other long term (current) drug therapy: Secondary | ICD-10-CM | POA: Diagnosis not present

## 2024-08-24 DIAGNOSIS — Z9079 Acquired absence of other genital organ(s): Secondary | ICD-10-CM | POA: Diagnosis not present

## 2024-08-24 DIAGNOSIS — Z90722 Acquired absence of ovaries, bilateral: Secondary | ICD-10-CM | POA: Diagnosis not present

## 2024-08-24 DIAGNOSIS — Z8543 Personal history of malignant neoplasm of ovary: Secondary | ICD-10-CM

## 2024-08-24 DIAGNOSIS — H538 Other visual disturbances: Secondary | ICD-10-CM | POA: Diagnosis not present

## 2024-08-24 DIAGNOSIS — Z90710 Acquired absence of both cervix and uterus: Secondary | ICD-10-CM | POA: Diagnosis not present

## 2024-08-24 DIAGNOSIS — C569 Malignant neoplasm of unspecified ovary: Secondary | ICD-10-CM

## 2024-08-24 NOTE — Progress Notes (Unsigned)
 Gynecologic Oncology Return Clinic Visit  08/25/24  Reason for Visit: follow-up  Treatment History: Oncology History Overview Note  CA-125 10/10/113: 68 02/2013: 5.6, post adjuvant chemotherapy (03/2013-02/2016): 3.2-8.8 08/2016: 10.2, just prior to recurrence diagnosis 02/14/17: 7.1, 3 months post completion of RT 09/17/19: 6.5  In 2014, presented with pelvic pain and pressure, urinary frequency.  In 08/2016, presented with 3 months of constipation and RLQ pain. On exam, had some fullness and tenderness along right pelvic sidewall.    Ovarian cancer (HCC)  07/12/2012 Imaging   PET: significant soft tissue thickening seen throughout the greater omentum suspicious for intraperitoneal carcinoma. There is minimal ascites in the pelvis. it revealed the areas of omental and peritoneal nodularity were hypermetabolic. There is a dominant pelvic omental mass measuring 10.4 x 3.3 cm. There is a small omental implants in the left side of the abdomen measuring 1.1 cm. There were multiple pelvic foci That wereperitoneal based And hypermetabolic. These included the cul-de-sac. She has small volume of ascites.    07/24/2012 Initial Biopsy   CT-guided revealed metastatic carcinoma. By IHC, malignant cells were positive for WT 1, cytokeratin 7, estrogen receptor. There were negative for TTF-1, progesterone receptor, cytokeratin 20, CEA, CD X2, and S100. Overall, the histologic features were highly suggestive of a primary gynecologic origin.   08/22/2012 Surgery   Radical debulking - TAH/BSO, ureterolysis, omentectomy, stripping of pelvic peritoneum, omentectomy, and rectosigmoid resection with low rectal reanastomosis Findings: stage IIIC ovarian cancer. Patient was optimally debulked. Debulking, however, required a rectosigmoid resection as well as total omentectomy total nominal hysterectomy bilateral salpingo-oophorectomy. She was optimally debulked with only 5 mm nodules on the right diaphragm as  residual disease.    08/23/2012 Initial Diagnosis   Ovarian cancer (HCC)   09/2012 - 02/06/2013 Chemotherapy   6 cycles of carboplatin /taxol     03/29/2013 Imaging   CT C/A/P: 1.  Marked response to therapy of omental/peritoneal disease.  No residual measurable disease identified. 2.  Interval hysterectomy and bilateral oophrectomy.   08/25/2016 Imaging   CT A/P: 1. Solitary new enlarged right external iliac lymph node, suspicious for metastatic nodal recurrence. 2. No additional sites of metastatic disease in the abdomen or pelvis. 3. Aortic atherosclerosis.   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 There is malignant range FDG uptake associated with the enlarged right external iliac lymph node compatible with metastatic adenopathy. 2. No additional sites of metastatic disease identified. 3. Aortic atherosclerosis.   10/25/2016 - 12/01/2016 Radiation Therapy   Radiation to right pelvis treated to 60.2Gy in 28 fractions   03/28/2017 Imaging   PET post RT: 1. The previously enlarged and hypermetabolic right external iliac lymph node is currently of normal size and the previous hypermetabolic activity has resolved. No current active malignancy identified. 2. 2 mm right mid kidney nonobstructive renal calculus. 3.  Aortic Atherosclerosis (ICD10-I70.0).   09/23/2017 Imaging   CT A/P: No evidence of bowel obstruction.  Normal appendix. No CT findings to account for the patient's worsening right lower quadrant abdominal pain. No evidence of recurrent or metastatic disease.   10/31/2017 Imaging   PET: No evidence local ovarian carcinoma recurrence in the pelvis. No metastatic adenopathy or distant metastatic disease.   05/15/2018 Imaging   PET: 1. No evidence recurrent or metastatic disease. 2.  Aortic  atherosclerosis (ICD10-170.0).   09/19/2019  Imaging   CT A/P: Mild increase in size of several left upper quadrant peritoneal nodules measuring up to 11 mm, highly suspicious for increased peritoneal carcinomatosis. No other sites of metastatic disease identified. No evidence of ascites.   11/27/2019 Genetic Testing   CHEK2 p.N405K VUS found on the cancerNext HRD testing.  The CancerNext gene panel offered by [employer] includes sequencing and rearrangement analysis for the following 34 genes:   APC, ATM, BARD1, BMPR1A, BRCA1, BRCA2, BRIP1, CDH1, CDK4, CDKN2A, CHEK2, DICER1, HOXB13, EPCAM, GREM1, MLH1, MRE11A, MSH2, MSH6, MUTYH, NBN, NF1, PALB2, PMS2, POLD1, POLE, PTEN, RAD50, RAD51C, RAD51D, SMAD4, SMARCA4, STK11, and TP53.  The report date is November 27, 2019.  HRD testing did not identify any pathogenic variants in the tumor.  Therefore HRD is neg.   09/09/2021 - 03/09/2022 Chemotherapy   Patient is on Treatment Plan : OVARIAN Paclitaxel  / Carboplatin  q7d     09/14/2022 - 11/30/2022 Chemotherapy   Patient is on Treatment Plan : BREAST Paclitaxel  + Carboplatin  q7d     10/06/2023 - 01/24/2024 Chemotherapy   Patient is on Treatment Plan : OVARIAN Liposomal Doxorubicin  (40) q28d     03/02/2024 - 05/14/2024 Chemotherapy   Patient is on Treatment Plan : OVARIAN mirvetuximab soravtansine -gynx D1 q21d       08/2022: Initial debulking surgery, R1 Germline: CHEK2 VUS 09/2012-02/2013: 6C carbo/taxol  08/2016: Enlarged R ext iliac lymph node, biopsy + (ER+, HER2 neg, FOLR1 80%, HRD neg) 10/2016: RT to R sidewall Complete imaging response 09/2019: mild peritoneal findings, asymptomatic Low progression of disease on imaging with decision to initiate treatment in late 2022. 09/2021-12/2021: weekly carbo/taxol  01/2022-03/2022: q 2 week carbo (taxol  held due to neuropathy) Stable disease on imaging, treatment break CT 08/2022: Increased peritoneal disease, increase small volume ascites.  Mild right  lower lobe pleural nodularity. 09/2022: Salvage carboplatin  only with weekly dosing followed by 1 week break (3C) CT 12/2022: mild progression of disease 01/2023-07/2023: Tamoxifen   Initial progression of disease on imaging in June 2024.  CT imaging in October 24 showed stable peritoneal carcinomatosis, stable ascites. Patient was transition to single agent Doxil  in 10/2023. CT imaging in 11/2023 showed stable disease, new small nodule at the left descending colon. C5 doxil  01/24/24. CT A/P 02/14/24: Overall increase in disease and ascites.  Interval History: Overall doing well.  She had developed increased blurry vision and watery discharge from her left eye, getting better since stopping mirvetuxumab.  Endorses a good appetite without nausea.  Reports normal bowel and bladder function.  Continues to have occasional nonbothersome abdominal pain.  Past Medical/Surgical History: Past Medical History:  Diagnosis Date   Anxiety and depression     Ascites    Chest pain    a. 09/2004 MV: No isch/infarct.   DJD (degenerative joint disease)    Elevated cholesterol    Endometrial polyp    Family history of bladder cancer    Family history of lymphoma    History of radiation therapy 10/25/16-12/01/16   right pelvis/60.2 Gy in 28 fractions   IRRITABLE BOWEL SYNDROME, HX OF 05/15/2008   Kidney stone    Lower extremity edema    MVP (mitral valve prolapse)    occ palpitations    Ovarian ca (HCC) 07/2012       Rheumatoid arthritis(714.0) ~ 2010   dr lenon   Right leg DVT (HCC)    a. 11/2022 following R hip fx/surgery-->eliquis .   Shingles     Past Surgical History:  Procedure Laterality Date  ABDOMINAL HYSTERECTOMY  08/22/2012   Procedure: HYSTERECTOMY ABDOMINAL;  Surgeon: Toribio LITTIE Percy, MD;  Location: [facility];  Service: Gynecology;  Laterality: N/A;   ANTERIOR APPROACH HEMI HIP ARTHROPLASTY Right 11/03/2022   Procedure: ANTERIOR APPROACH HEMI HIP ARTHROPLASTY;  Surgeon: Leora Lynwood SAUNDERS, MD;  Location: [facility];  Service: Orthopedics;  Laterality: Right;   BACK SURGERY  10/04/2006   Dr Malcolm   COLONOSCOPY  04/20/2004   COLONOSCOPY W/ BIOPSIES  07/04/2014   COLOSTOMY REVISION  08/22/2012   Procedure: COLON RESECTION SIGMOID;  Surgeon: Toribio LITTIE Percy, MD;  Location: [facility];  Service: Gynecology;;  Rectal Sigmoid resection and low rectal anastomosis   CYST ON NECK  10/04/2009   DEBULKING  08/22/2012   Procedure: DEBULKING;  Surgeon: Toribio LITTIE Percy, MD;  Location: [facility];  Service: Gynecology;  Laterality: N/A;  Radical tumor debulking, Bilateral Ureterolysis   DILATION AND CURETTAGE OF UTERUS  10/04/2002   WITH HYSTEROSCOPY   HAND SURGERY  10/04/1994   HYSTEROSCOPY  10/04/2002   D&C   IR IMAGING GUIDED PORT INSERTION  09/01/2021   IR PARACENTESIS  09/27/2023   KIDNEY STONE SURGERY Right    with stent placement  Mar 03 2021   NECK SURGERY  10/05/2007   SPURS   OMENTECTOMY  08/22/2012   Procedure: OMENTECTOMY;  Surgeon: Toribio LITTIE Percy, MD;  Location: [facility];  Service: Gynecology;  Laterality: N/A;   SALPINGOOPHORECTOMY  08/22/2012   Procedure: SALPINGO OOPHORECTOMY;  Surgeon: Toribio LITTIE Percy, MD;  Location: [facility];  Service: Gynecology;  Laterality: Bilateral;   TUBAL LIGATION  10/04/1978    Family History  Problem Relation Age of Onset   Heart disease Mother    Bladder Cancer Father 82   Osteoporosis Sister    Lymphoma Paternal Aunt    Colon cancer Neg Hx    Rectal cancer Neg Hx    Stomach cancer Neg Hx    Diabetes Neg Hx    Stroke Neg Hx    Colon polyps Neg Hx    Breast cancer Neg Hx     Social History   Socioeconomic History   Marital status: Legally Separated    Spouse name: Not on file   Number of children: 2   Years of education: Not on file   Highest education level: Not on file  Occupational History   Occupation: disability d/t RA  Tobacco Use   Smoking status: Never   Smokeless tobacco: Never   Vaping Use   Vaping status: Never Used  Substance and Sexual Activity   Alcohol use: No   Drug use: No   Sexual activity: Not Currently    Birth control/protection: Post-menopausal, Surgical  Other Topics Concern   Not on file  Social History Narrative   Lenward Fleeta Evangelist is her daughter    Household-- pr and husband   Social Drivers of Corporate Investment Banker Strain: Low Risk  (06/21/2024)   Overall Financial Resource Strain (CARDIA)    Difficulty of Paying Living Expenses: Not hard at all  Food Insecurity: No Food Insecurity (06/21/2024)   Hunger Vital Sign    Worried About Running Out of Food in the Last Year: Never true    Ran Out of Food in the Last Year: Never true  Transportation Needs: No Transportation Needs (06/21/2024)   PRAPARE - Administrator, Civil Service (Medical): No    Lack of Transportation (Non-Medical): No  Physical Activity: Insufficiently Active (06/21/2024)  Exercise Vital Sign    Days of Exercise per Week: 4 days    Minutes of Exercise per Session: 30 min  Stress: No Stress Concern Present (06/21/2024)   [employer] of Occupational Health - Occupational Stress Questionnaire    Feeling of Stress: Not at all  Social Connections: Moderately Isolated (06/21/2024)   Social Connection and Isolation Panel    Frequency of Communication with Friends and Family: More than three times a week    Frequency of Social Gatherings with Friends and Family: More than three times a week    Attends Religious Services: More than 4 times per year    Active Member of [employer] or Organizations: No    Attends Banker Meetings: Never    Marital Status: Separated    Current Medications:  Current Outpatient Medications:    apixaban  (ELIQUIS ) 5 MG TABS tablet, Take 5 mg by mouth 2 (two) times daily., Disp: , Rfl:    Artificial Tears ophthalmic solution, Place 1-2 drops into both eyes 4 (four) times daily. Wait at least 10 minutes after steroid  drop use before administration, Disp: , Rfl:    dexamethasone  (DECADRON ) 0.1 % ophthalmic solution, Place 1 drop into both eyes every 3 (three) hours., Disp: , Rfl:    docusate sodium  (COLACE) 100 MG capsule, Take 100 mg by mouth daily as needed for mild constipation., Disp: , Rfl:    etoposide  (VEPESID ) 50 MG capsule, Take 1 capsule (50 mg total) by mouth daily. Take for 21 days, then hold for 7 days. Repeat every 28 days. Start cycle on 08/21/2024, Disp: 21 capsule, Rfl: 0   fish oil-omega-3 fatty acids 1000 MG capsule, Take 1 g by mouth daily., Disp: , Rfl:    furosemide  (LASIX ) 20 MG tablet, Take 20 mg by mouth. (Patient not taking: Reported on 08/20/2024), Disp: , Rfl:    Ginger, Zingiber officinalis, (GINGER PO), Take 1 Dose by mouth daily at 6 (six) AM., Disp: , Rfl:    ondansetron  (ZOFRAN ) 8 MG tablet, Take 1 tablet (8 mg total) by mouth every 8 (eight) hours as needed for nausea or vomiting., Disp: 30 tablet, Rfl: 2   Probiotic Product (FORTIFY PROBIOTIC WOMENS PO), Take 1 tablet by mouth 1 day or 1 dose., Disp: , Rfl:    prochlorperazine  (COMPAZINE ) 10 MG tablet, Take 1 tablet (10 mg total) by mouth every 6 (six) hours as needed for nausea or vomiting., Disp: 30 tablet, Rfl: 2   RESTASIS 0.05 % ophthalmic emulsion, Place 1 drop into both eyes 2 (two) times daily. (Patient not taking: Reported on 08/20/2024), Disp: , Rfl:    torsemide  (DEMADEX ) 20 MG tablet, Take 20 mg by mouth daily. (Patient not taking: Reported on 08/20/2024), Disp: , Rfl:    Turmeric (QC TUMERIC COMPLEX PO), Take 1 Dose by mouth daily., Disp: , Rfl:    Vitamin D , Ergocalciferol , (DRISDOL ) 1.25 MG (50000 UNIT) CAPS capsule, Take 1 capsule (50,000 Units total) by mouth every 7 (seven) days., Disp: 12 capsule, Rfl: 0 No current facility-administered medications for this visit.  Facility-Administered Medications Ordered in Other Visits:    heparin  lock flush 100 unit/mL, 500 Units, Intracatheter, Once PRN, Debby Olam POUR,  NP   sodium chloride  flush (NS) 0.9 % injection 10 mL, 10 mL, Intracatheter, PRN, Debby Olam POUR, NP  Review of Systems: + Joint pain, back pain, itching, dizziness, numbness Denies appetite changes, fevers, chills, fatigue, unexplained weight changes. Denies hearing loss, neck lumps or masses, mouth sores,  ringing in ears or voice changes. Denies cough or wheezing.  Denies shortness of breath. Denies chest pain or palpitations. Denies leg swelling. Denies abdominal distention, pain, blood in stools, constipation, diarrhea, nausea, vomiting, or early satiety. Denies pain with intercourse, dysuria, frequency, hematuria or incontinence. Denies hot flashes, pelvic pain, vaginal bleeding or vaginal discharge.   Denies muscle pain/cramps. Denies rash, or wounds. Denies headaches or seizures. Denies swollen lymph nodes or glands, denies easy bruising or bleeding. Denies anxiety, depression, confusion, or decreased concentration.  Physical Exam: BP 120/64 (BP Location: Right Arm, Patient Position: Sitting)   Pulse 86   Temp 97.8 F (36.6 C) (Oral)   Resp 19   Wt 144 lb 6.4 oz (65.5 kg)   SpO2 97%   BMI 25.58 kg/m  General: Alert, oriented, no acute distress. HEENT: Posterior oropharynx clear, sclera anicteric. Chest: Clear to auscultation bilaterally.  No wheezes or Rhonchi. Cardiovascular: Regular rate and rhythm, no murmurs. Abdomen: soft, nontender.  Normoactive bowel sounds.  No masses or hepatosplenomegaly appreciated.  Well-healed scar. Extremities: Grossly normal range of motion.  Warm, well perfused.  No edema bilaterally. Skin: No rashes or lesions noted. Lymphatics: No cervical, supraclavicular, or inguinal adenopathy. GU: Normal appearing external genitalia without erythema, excoriation, or lesions.  Speculum exam reveals mildly atrophic vaginal mucosa, no lesions.  Cuff intact with laxity of the vaginal walls and some prolapse noted.  Bimanual exam reveals cuff is smooth, no  nodularity or masses appreciated.  Rectovaginal exam  confirms these findings.  Laboratory & Radiologic Studies:          Component Ref Range & Units (hover) 4 d ago (08/20/24) 2 mo ago (06/25/24) 2 mo ago (06/06/24) 3 mo ago (05/14/24) 4 mo ago (04/16/24) 5 mo ago (03/26/24) 5 mo ago (03/19/24)  Cancer Antigen (CA) 125 8.0 7.2 CM 9.1 CM 15.2 CM 112.0 High  CM 33.5 CM 14.2     04/28/24: CT A/P: Diminished volume of extensively loculated fluid throughout the abdomen, with associated pleural thickening. 2. Some peritoneal nodules in the left upper quadrant diminished in size, other nodules unchanged. 3. Overall findings consistent with slightly improved peritoneal metastatic disease. No evidence of new metastatic disease in the abdomen or pelvis. 4. Hysterectomy.  Assessment & Plan: [name] [name] is a 73 y.o. woman with recurrent and metastatic HGSOC now on oral etoposide .  Patient is doing very well, basically asymptomatic. Functional status continues to be 0.   She had disease response with mirvetuximab, but unfortunately developed some ocular toxicity and the decision was to stop treatment. She is now on oral etoposide , which she is tolerating well. Her next imaging is in early December.   I will plan to see her back in 4 months but happy to see her back sooner to have further discussions about treatment options (either in person or by phone), if CT shows progression.  22 minutes of total time was spent for this patient encounter, including preparation, face-to-face counseling with the patient and coordination of care, and documentation of the encounter.  Comer Dollar, MD  Division of Gynecologic Oncology  Department of Obstetrics and Gynecology  [facility]

## 2024-08-24 NOTE — Patient Instructions (Signed)
It was good to see you today.   I will see you for follow-up in 4-5 months.  As always, if you develop any new and concerning symptoms before your next visit, please call to see me sooner.

## 2024-08-25 ENCOUNTER — Encounter: Payer: Self-pay | Admitting: Oncology

## 2024-09-03 ENCOUNTER — Ambulatory Visit
Admission: RE | Admit: 2024-09-03 | Discharge: 2024-09-03 | Disposition: A | Source: Ambulatory Visit | Attending: Nurse Practitioner

## 2024-09-03 DIAGNOSIS — M8580 Other specified disorders of bone density and structure, unspecified site: Secondary | ICD-10-CM | POA: Diagnosis not present

## 2024-09-04 ENCOUNTER — Telehealth: Payer: Self-pay | Admitting: Nurse Practitioner

## 2024-09-05 ENCOUNTER — Ambulatory Visit: Admitting: Podiatry

## 2024-09-05 ENCOUNTER — Ambulatory Visit

## 2024-09-05 DIAGNOSIS — L600 Ingrowing nail: Secondary | ICD-10-CM

## 2024-09-05 DIAGNOSIS — M7751 Other enthesopathy of right foot: Secondary | ICD-10-CM

## 2024-09-07 ENCOUNTER — Telehealth: Payer: Self-pay

## 2024-09-07 NOTE — Telephone Encounter (Signed)
 Copied from CRM [id number]. Topic: General - Billing Inquiry >> Sep 07, 2024  1:47 PM Harlene ORN wrote: Reason for CRM: Patient called. States her Physical appointment with 08/08/2024 should have been labelled as an Annual Wellness Visit. Transferred patient to billing to discuss further.

## 2024-09-08 NOTE — Progress Notes (Signed)
 Subjective:   Patient ID: [name] [name], female   DOB: 73 y.o.   MRN: [id number]   HPI Patient presents stating that the right big toenail is very thickened and damaged and it is painful and she knows that she needs to have it fixed.  States it is hard to wear shoe gear or trim.  Patient does not smoke likes to be active   Review of Systems  All other systems reviewed and are negative.       Objective:  Physical Exam Vitals and nursing note reviewed.  Constitutional:      Appearance: She is well-developed.  Pulmonary:     Effort: Pulmonary effort is normal.  Musculoskeletal:        General: Normal range of motion.  Skin:    General: Skin is warm.  Neurological:     Mental Status: She is alert.     Neurovascular status found to be intact muscle strength found to be adequate range of motion within normal limits.  Patient is found to have severely damaged thickened hallux nail right history of having had the left one removed that is doing well with discomfort associated with this and probability of trauma.  Patient does not smoke likes to be active     Assessment:  Chronic ingrown toenail deformity right hallux thickened and damaged with history of permanent removal of the left     Plan:  NP reviewed and I have recommended nail removal and patient wants procedure and I explained that removal of a permanent nature.  At this point patient read signed consent form I infiltrated the right big toe 60 mg like Marcaine  mixture sterile prep done using sterile instrumentation removed hallux nail exposed matrix applied phenol 5 applications 30 seconds followed by alcohol lavage sterile dressing gave instructions on soaks wear dressing 24 hours take it off earlier if throbbing were to

## 2024-09-10 ENCOUNTER — Ambulatory Visit (HOSPITAL_BASED_OUTPATIENT_CLINIC_OR_DEPARTMENT_OTHER)
Admission: RE | Admit: 2024-09-10 | Discharge: 2024-09-10 | Attending: Nurse Practitioner | Admitting: Nurse Practitioner

## 2024-09-10 ENCOUNTER — Inpatient Hospital Stay: Attending: Oncology

## 2024-09-10 DIAGNOSIS — C569 Malignant neoplasm of unspecified ovary: Secondary | ICD-10-CM | POA: Insufficient documentation

## 2024-09-10 DIAGNOSIS — R3915 Urgency of urination: Secondary | ICD-10-CM | POA: Diagnosis not present

## 2024-09-10 DIAGNOSIS — C786 Secondary malignant neoplasm of retroperitoneum and peritoneum: Secondary | ICD-10-CM | POA: Diagnosis present

## 2024-09-10 MED ORDER — IOHEXOL 300 MG/ML  SOLN
100.0000 mL | Freq: Once | INTRAMUSCULAR | Status: AC | PRN
  Administered 2024-09-10: 100 mL via INTRAVENOUS

## 2024-09-10 NOTE — Patient Instructions (Signed)

## 2024-09-12 ENCOUNTER — Telehealth: Payer: Self-pay | Admitting: Nurse Practitioner

## 2024-09-12 ENCOUNTER — Other Ambulatory Visit: Payer: Self-pay | Admitting: Oncology

## 2024-09-12 ENCOUNTER — Other Ambulatory Visit: Payer: Self-pay

## 2024-09-12 ENCOUNTER — Telehealth: Payer: Self-pay | Admitting: *Deleted

## 2024-09-12 DIAGNOSIS — C569 Malignant neoplasm of unspecified ovary: Secondary | ICD-10-CM

## 2024-09-12 NOTE — Addendum Note (Signed)
 Addended by: STEVA DEVERE CROME on: 09/12/2024 04:22 PM   Modules accepted: Orders

## 2024-09-12 NOTE — Telephone Encounter (Signed)
 LVM for [name] [name] to tell her the CT scan shows a blockage from her right kidney. We need to see her tomorrow w/labs to determine how to resolve this issue. Informed scheduler to set up appointment and call her with time.

## 2024-09-12 NOTE — Telephone Encounter (Signed)
 Wait till office visit on 12/11

## 2024-09-12 NOTE — Telephone Encounter (Signed)
 [name] [name] called to inquire if her CT of 12/8 has been read yet? She reports no BM in 3 days and she is having increase in her RLQ abdominal pain that tends to be cramping in nature. She has taken MiraLax  yesterday and today. Has mild nausea, but this is her baseline. Instructed her to take MiraLax  bid as well as colace bid. Call back tomorrow if she has not had BM. Called reading room to request CT read as soon as able.

## 2024-09-12 NOTE — Telephone Encounter (Signed)
 Received call from Dr. Malva, radiology regarding CT scan completed 09/10/2024.  She has new right hydronephrosis/hydroureter.  We will contact Ms. Bolon for a lab appointment and office visit tomorrow, 09/13/2024.

## 2024-09-13 ENCOUNTER — Inpatient Hospital Stay (HOSPITAL_BASED_OUTPATIENT_CLINIC_OR_DEPARTMENT_OTHER): Admitting: Nurse Practitioner

## 2024-09-13 ENCOUNTER — Inpatient Hospital Stay

## 2024-09-13 ENCOUNTER — Encounter: Payer: Self-pay | Admitting: Nurse Practitioner

## 2024-09-13 ENCOUNTER — Other Ambulatory Visit: Payer: Self-pay

## 2024-09-13 ENCOUNTER — Other Ambulatory Visit (HOSPITAL_COMMUNITY): Payer: Self-pay

## 2024-09-13 ENCOUNTER — Telehealth: Payer: Self-pay | Admitting: Nurse Practitioner

## 2024-09-13 VITALS — BP 117/74 | HR 78 | Temp 98.0°F | Resp 16 | Wt 143.9 lb

## 2024-09-13 DIAGNOSIS — C569 Malignant neoplasm of unspecified ovary: Secondary | ICD-10-CM | POA: Diagnosis not present

## 2024-09-13 DIAGNOSIS — N1330 Unspecified hydronephrosis: Secondary | ICD-10-CM | POA: Diagnosis not present

## 2024-09-13 DIAGNOSIS — C786 Secondary malignant neoplasm of retroperitoneum and peritoneum: Secondary | ICD-10-CM | POA: Diagnosis not present

## 2024-09-13 LAB — CBC WITH DIFFERENTIAL (CANCER CENTER ONLY)
Abs Immature Granulocytes: 0.02 K/uL (ref 0.00–0.07)
Basophils Absolute: 0 K/uL (ref 0.0–0.1)
Basophils Relative: 1 %
Eosinophils Absolute: 0.1 K/uL (ref 0.0–0.5)
Eosinophils Relative: 2 %
HCT: 35.3 % — ABNORMAL LOW (ref 36.0–46.0)
Hemoglobin: 12.1 g/dL (ref 12.0–15.0)
Immature Granulocytes: 0 %
Lymphocytes Relative: 12 %
Lymphs Abs: 0.8 K/uL (ref 0.7–4.0)
MCH: 35.5 pg — ABNORMAL HIGH (ref 26.0–34.0)
MCHC: 34.3 g/dL (ref 30.0–36.0)
MCV: 103.5 fL — ABNORMAL HIGH (ref 80.0–100.0)
Monocytes Absolute: 0.3 K/uL (ref 0.1–1.0)
Monocytes Relative: 5 %
Neutro Abs: 5.1 K/uL (ref 1.7–7.7)
Neutrophils Relative %: 80 %
Platelet Count: 235 K/uL (ref 150–400)
RBC: 3.41 MIL/uL — ABNORMAL LOW (ref 3.87–5.11)
RDW: 16.1 % — ABNORMAL HIGH (ref 11.5–15.5)
WBC Count: 6.4 K/uL (ref 4.0–10.5)
nRBC: 0 % (ref 0.0–0.2)

## 2024-09-13 LAB — URINALYSIS, COMPLETE (UACMP) WITH MICROSCOPIC
Bacteria, UA: NONE SEEN
Bilirubin Urine: NEGATIVE
Glucose, UA: NEGATIVE mg/dL
Hgb urine dipstick: NEGATIVE
Ketones, ur: NEGATIVE mg/dL
Nitrite: NEGATIVE
Protein, ur: NEGATIVE mg/dL
Specific Gravity, Urine: 1.016 (ref 1.005–1.030)
pH: 6.5 (ref 5.0–8.0)

## 2024-09-13 LAB — CMP (CANCER CENTER ONLY)
ALT: 17 U/L (ref 0–44)
AST: 25 U/L (ref 15–41)
Albumin: 4.1 g/dL (ref 3.5–5.0)
Alkaline Phosphatase: 66 U/L (ref 38–126)
Anion gap: 10 (ref 5–15)
BUN: 29 mg/dL — ABNORMAL HIGH (ref 8–23)
CO2: 29 mmol/L (ref 22–32)
Calcium: 10.1 mg/dL (ref 8.9–10.3)
Chloride: 102 mmol/L (ref 98–111)
Creatinine: 0.67 mg/dL (ref 0.44–1.00)
GFR, Estimated: >60 mL/min (ref >60)
Glucose, Bld: 97 mg/dL (ref 70–99)
Potassium: 4.4 mmol/L (ref 3.5–5.1)
Sodium: 141 mmol/L (ref 135–145)
Total Bilirubin: 0.6 mg/dL (ref 0.0–1.2)
Total Protein: 7.1 g/dL (ref 6.5–8.1)

## 2024-09-13 MED ORDER — ETOPOSIDE 50 MG PO CAPS
50.0000 mg | ORAL_CAPSULE | Freq: Every day | ORAL | 0 refills | Status: DC
  Filled 2024-09-13: qty 21, 28d supply, fill #0

## 2024-09-13 NOTE — Patient Instructions (Signed)

## 2024-09-13 NOTE — Progress Notes (Addendum)
 [facility]   Telephone:(336) [phone] [phone]    Patient Care Team: Wendee Lynwood HERO, NP as PCP - General (Nurse Practitioner) Aneita Gwendlyn DASEN, MD (Inactive) as Consulting Physician (Gastroenterology) Christi Glendia HERO, MD as Consulting Physician (Pulmonary Disease) Cloretta Arley NOVAK, MD as Consulting Physician (Oncology) Micheline Eleanor BIRCH, NP as Nurse Practitioner (Gynecologic Oncology) Viktoria Comer SAUNDERS, MD as Consulting Physician (Gynecologic Oncology) Leora Lynwood SAUNDERS, MD as Consulting Physician (Orthopedic Surgery)   CHIEF COMPLAINT: Follow-up ovarian cancer  CURRENT THERAPY: Oral etoposide  beginning 06/26/24   INTERVAL HISTORY [name] [name] returns for follow-up.  She underwent restaging CT that showed new right hydronephrosis. She's had an intermittent RLQ pain/discomfort that feels like a muscle spasm for some time, which has increased in the past week with associated right flank pain. Notes some urinary urgency which has an odor, no dysuria or hematuria. She was constipated which has resolved. Denies n/v. Tolerating etoposide , began week off today.   ROS  All other systems reviewed and negative   Past Medical History:  Diagnosis Date   Anxiety and depression     Ascites    Chest pain    a. 09/2004 MV: No isch/infarct.   DJD (degenerative joint disease)    Elevated cholesterol    Endometrial polyp    Family history of bladder cancer    Family history of lymphoma    History of radiation therapy 10/25/16-12/01/16   right pelvis/60.2 Gy in 28 fractions   IRRITABLE BOWEL SYNDROME, HX OF 05/15/2008   Kidney stone    Lower extremity edema    MVP (mitral valve prolapse)    occ palpitations    Ovarian ca (HCC) 07/2012       Rheumatoid arthritis(714.0) ~ 2010   dr lenon   Right leg DVT (HCC)    a. 11/2022 following R hip fx/surgery-->eliquis .   Shingles      Past Surgical History:  Procedure Laterality Date   ABDOMINAL HYSTERECTOMY   08/22/2012   Procedure: HYSTERECTOMY ABDOMINAL;  Surgeon: Toribio LITTIE Percy, MD;  Location: [facility];  Service: Gynecology;  Laterality: N/A;   ANTERIOR APPROACH HEMI HIP ARTHROPLASTY Right 11/03/2022   Procedure: ANTERIOR APPROACH HEMI HIP ARTHROPLASTY;  Surgeon: Leora Lynwood SAUNDERS, MD;  Location: [facility];  Service: Orthopedics;  Laterality: Right;   BACK SURGERY  10/04/2006   Dr Malcolm   COLONOSCOPY  04/20/2004   COLONOSCOPY W/ BIOPSIES  07/04/2014   COLOSTOMY REVISION  08/22/2012   Procedure: COLON RESECTION SIGMOID;  Surgeon: Toribio LITTIE Percy, MD;  Location: [facility];  Service: Gynecology;;  Rectal Sigmoid resection and low rectal anastomosis   CYST ON NECK  10/04/2009   DEBULKING  08/22/2012   Procedure: DEBULKING;  Surgeon: Toribio LITTIE Percy, MD;  Location: [facility];  Service: Gynecology;  Laterality: N/A;  Radical tumor debulking, Bilateral Ureterolysis   DILATION AND CURETTAGE OF UTERUS  10/04/2002   WITH HYSTEROSCOPY   HAND SURGERY  10/04/1994   HYSTEROSCOPY  10/04/2002   D&C   IR IMAGING GUIDED PORT INSERTION  09/01/2021   IR PARACENTESIS  09/27/2023   KIDNEY STONE SURGERY Right    with stent placement  Mar 03 2021   NECK SURGERY  10/05/2007   SPURS   OMENTECTOMY  08/22/2012   Procedure: OMENTECTOMY;  Surgeon: Toribio LITTIE Percy, MD;  Location: [facility];  Service: Gynecology;  Laterality: N/A;   SALPINGOOPHORECTOMY  08/22/2012   Procedure: SALPINGO OOPHORECTOMY;  Surgeon: Toribio LITTIE Percy, MD;  Location: [facility];  Service: Gynecology;  Laterality: Bilateral;   TUBAL LIGATION  10/04/1978     Outpatient Encounter Medications as of 09/13/2024  Medication Sig   apixaban  (ELIQUIS ) 5 MG TABS tablet Take 5 mg by mouth 2 (two) times daily.   Artificial Tears ophthalmic solution Place 1-2 drops into both eyes 4 (four) times daily. Wait at least 10 minutes after steroid drop use before administration   dexamethasone  (DECADRON ) 0.1 % ophthalmic solution Place 1  drop into both eyes every 3 (three) hours.   docusate sodium  (COLACE) 100 MG capsule Take 100 mg by mouth daily as needed for mild constipation.   etoposide  (VEPESID ) 50 MG capsule Take 1 capsule (50 mg total) by mouth daily. Take for 21 days, then hold for 7 days. Repeat every 28 days. Start cycle on 08/21/2024   fish oil-omega-3 fatty acids 1000 MG capsule Take 1 g by mouth daily.   Ginger, Zingiber officinalis, (GINGER PO) Take 1 Dose by mouth daily at 6 (six) AM.   ondansetron  (ZOFRAN ) 8 MG tablet Take 1 tablet (8 mg total) by mouth every 8 (eight) hours as needed for nausea or vomiting.   prochlorperazine  (COMPAZINE ) 10 MG tablet Take 1 tablet (10 mg total) by mouth every 6 (six) hours as needed for nausea or vomiting.   Turmeric (QC TUMERIC COMPLEX PO) Take 1 Dose by mouth daily.   Vitamin D , Ergocalciferol , (DRISDOL ) 1.25 MG (50000 UNIT) CAPS capsule Take 1 capsule (50,000 Units total) by mouth every 7 (seven) days.   Probiotic Product (FORTIFY PROBIOTIC WOMENS PO) Take 1 tablet by mouth 1 day or 1 dose. (Patient not taking: Reported on 09/13/2024)   Facility-Administered Encounter Medications as of 09/13/2024  Medication   heparin  lock flush 100 unit/mL   sodium chloride  flush (NS) 0.9 % injection 10 mL     Today's Vitals   09/13/24 0951 09/13/24 0958  BP:  117/74  Pulse:  78  Resp:  16  Temp:  98 F (36.7 C)  TempSrc:  Temporal  SpO2:  96%  Weight:  143 lb 14.4 oz (65.3 kg)  PainSc: 0-No pain    Body mass index is 25.49 kg/m.   ECOG PERFORMANCE STATUS: 1 - Symptomatic but completely ambulatory  PHYSICAL EXAM GENERAL:alert, no distress and comfortable SKIN: no rash  EYES: sclera clear NECK: without mass LYMPH:  no palpable cervical or supraclavicular lymphadenopathy  LUNGS: clear with normal breathing effort HEART: regular rate & rhythm, no lower extremity edema ABDOMEN: mild ttp RLQ and right flank, abdomen soft, normal bowel sounds NEURO: alert & oriented x 3 with  fluent speech PAC covered with gauze  CBC    Latest Ref Rng & Units 09/13/2024    9:10 AM 08/20/2024    8:55 AM 08/08/2024    9:07 AM  CBC  WBC 4.0 - 10.5 K/uL 6.4  4.4  4.6   Hemoglobin 12.0 - 15.0 g/dL 87.8  87.5  86.7   Hematocrit 36.0 - 46.0 % 35.3  37.2  40.2   Platelets 150 - 400 K/uL 235  257  239.0       CMP     Latest Ref Rng & Units 09/13/2024    9:10 AM 08/20/2024    8:55 AM 08/08/2024    9:07 AM  CMP  Glucose 70 - 99 mg/dL 97  67  78   BUN 8 - 23 mg/dL 29  21  23    Creatinine 0.44 - 1.00 mg/dL 9.32  9.36  0.67   Sodium 135 - 145 mmol/L 141  141  140   Potassium 3.5 - 5.1 mmol/L 4.4  4.1  4.2   Chloride 98 - 111 mmol/L 102  105  103   CO2 22 - 32 mmol/L 29  27  30    Calcium  8.9 - 10.3 mg/dL 89.8  9.7  9.1   Total Protein 6.5 - 8.1 g/dL 7.1  6.7  6.7   Total Bilirubin 0.0 - 1.2 mg/dL 0.6  0.3  0.8   Alkaline Phos 38 - 126 U/L 66  71  61   AST 15 - 41 U/L 25  25  23    ALT 0 - 44 U/L 17  19  21        ASSESSMENT & PLAN: 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 A 5 mm nodules remain on the right diaphragm. - TumorNext paired germline/tumor analyses: No somatic variants detected, germline CHEK2      VUS      Cycle 1 of adjuvant Taxol /carboplatin  chemotherapy initiated on 09/19/2012.   The CA 125 normalized.   She completed day 15 of cycle 6 on 02/06/2013.   Restaging CT evaluation 03/29/2013 showed no evidence of metastatic disease in the chest. There was marked improvement in appearance/resolution of previous described peritoneal/omental disease. There was no convincing evidence of residual disease. There was minimal increased density in the region of the omentum favored to be treatment-related. There was no pelvic adenopathy. CA125 3.2 on 02/19/2014. 08/18/2016 CA-125 8 CT abdomen/pelvis 08/25/2016-solitary new  enlarged right external iliac lymph node measuring 2.2 cm. Biopsy 09/07/2016-adenocarcinoma consistent with high-grade serous carcinoma.  ER +90%, PR -0%, HER2 negative (0); foundation 1-HRD not detected, microsatellite status cannot be determined, tumor mutation burden 8, FOLR1 positive-80%.   PET scan 09/22/2016-malignant range FDG uptake associated with the enlarged right external iliac lymph node compatible with metastatic adenopathy.  No additional sites of metastatic disease identified. Radiation 10/25/2016-12/01/2016 PET scan 03/28/2017-previously enlarged hypermetabolic right external iliac node-normal in size with resolution of hypermetabolic activity, no active malignancy identified PET scan 05/15/2018-no evidence of recurrent or metastatic disease, no hypermetabolic lymph nodes CT abdomen/pelvis 09/19/2019-mild increase in the size of several left upper quadrant peritoneal nodules measuring up to 11 mm.  No other sites of metastatic disease identified.  No ascites. CT abdomen/pelvis 03/14/2020-slight enlargement of several left upper quadrant peritoneal nodules, no ascites, no other evidence of disease progression CT abdomen/pelvis 09/12/2020 peritoneal nodularity/omental caking predominantly in the left upper/mid abdomen, mildly progressive.  Largest implant in the left upper abdomen adjacent to the stomach now measures 17 mm, previously 13 mm.  Overall volume of peritoneal disease has mildly progressed. CT abdomen/pelvis 01/13/2021- 4 mm right ureteral calculus with moderate right hydronephrosis and upper right hydroureter, progressive omental nodularity, stable calcified and partially calcified right pelvic nodules CT abdomen/pelvis 05/13/2021-enlargement of left upper quadrant omental nodules and a peritoneal nodule at the right iliac fossa, no ascites CT abdomen/pelvis 08/14/2021-mild increase in size of omental nodules, no ascites, no new site of metastatic disease Taxol /carboplatin  09/09/2021,  09/15/2021, 09/22/2021 Taxol /carboplatin  10/07/2021, 10/13/2021, 10/20/2021 Taxol /Carboplatin  11/03/2021, 11/10/2021, 11/17/2021 CT 11/23/2021-decrease in peritoneal metastases, no new or progressive disease, airspace opacity at both lung bases Taxol /carboplatin  12/01/2021, 12/08/2021, 12/15/2021 Taxol /Carboplatin  every 2 weeks beginning 12/29/2021 Carboplatin  alone 01/26/2022, Taxol  held due to neuropathy Carboplatin  alone 02/09/2022, Taxol  held due to neuropathy Carboplatin  alone 02/23/2022, Taxol  held due to neuropathy  Carboplatin  alone 03/09/2022, Taxol  held due to neuropathy CTs 03/25/2022-no significant change in omental, mesenteric disease.  No new or clearly progressive findings.  Previously noted patchy areas of consolidative opacity at the lung bases have resolved. Treatment break beginning 03/26/2022 CT/pelvis 08/10/2022-increased size of peritoneal nodules, increased small volume ascites, mild right lower lobe pleural nodularity Cycle 1 salvage carboplatin  09/14/2022 (weekly x 3 followed by 1 week break) Cycle 2 salvage carboplatin  10/12/2022 (weekly x 3 followed by 1 week break) Cycle 3 salvage carboplatin  11/16/2022 (weekly x 3 followed by 1 week break) CT abdomen/pelvis 12/08/2022-mild increase in peritoneal carcinomatosis Tamoxifen  20 mg daily 01/07/2023 CT abdomen/pelvis 03/29/2023-increased ascites with persistent diffuse peritoneal nodularity, largest omental implants have decreased in size.  No enlarging implants.,  No evidence of bowel obstruction Paracentesis for 4.5 L 06/29/2023 CTs 08/02/2023-peritoneal carcinomatosis similar to slightly improved.  Stable volume of ascites.  No evidence of metastatic disease in the chest. Tamoxifen  20 mg daily continued 08/02/2023 Paracentesis 08/25/2023-4 L of fluid removed, cytology shows adenocarcinoma Plan for Doxil  Baseline 2D echo 09/23/2023-LVEF 55 to 60%.  Left ventricle has normal function.  Abnormal strain with apical sparing, follow-up TTE/MRI suggested in  3 months. Cycle 1 Doxil  10/06/2023 Cycle 2 Doxil  11/03/2023 CT abdomen/pelvis 11/16/2023: Compared to 08/02/2023 there is overall stable disease, new 0.9 cm nodule at the left descending colon, I reviewed the CT images.  Multiple lesions appear unchanged.  Stable ascites and peritoneal thickening Cycle 3 Doxil  12/01/2023 Echocardiogram 12/20/2023: LVEF 55-60% Cycle 4 Doxil  12/27/2023 Cycle 5 Doxil  01/24/2024 CTs abdomen/pelvis 02/14/2024-interval increase in the amount of ascites and peritoneal carcinomatosis.  No acute inflammatory process. Cycle 1 mirvetuximab 03/02/2024 Cycle 2 mirvetuximab 03/26/2024 Cycle 3 mirvetuximab 04/16/2024 CT 04/28/2024-diminished volume of extensively loculated fluid throughout the abdomen.  Some peritoneal nodules in the left upper quadrant diminished in size, other nodules unchanged.  Overall findings consistent with slightly improved peritoneal metastatic disease.  No new disease in the abdomen or pelvis. Cycle 4 mirvetuximab 05/14/2024 Cycle 5 mirvetuximab held on 06/06/2024 secondary to ocular toxicity Cycle 1 oral etoposide  06/26/2024 Cycle 2 oral etoposide  07/24/2024 Cycle 3 oral Etoposide  08/21/2024 CT AP 09/10/2024: Stable peritoneal nodules without new peritoneal nodularity; no interval evidence of metastatic progression in the peritoneum. Interval decrease in peritoneal free fluid. New right hydronephrosis and hydroureter with a caliber change in the mid ureter, concerning for ureteral obstruction by metastatic disease.  Cycle 4 oral etoposide  to start 09/20/2024   2. Low abdomen/suprapubic pain prior to the exploratory laparotomy-likely secondary to omental/pelvic tumor; persistent mild pain in the lower abdomen   3. Chronic neck and back pain.   4. Anxiety -persistent despite Lexapro  and Xanax . She has been evaluated by psychiatry. Currently on Lexapro . 5. Status post Port-A-Cath placement 09/22/2012. The Port-A-Cath was removed on 04/03/2013.   6. Neutropenia  secondary to chemotherapy- day 15 cycle 1 and cycle 3. Taxol /carboplatin  not given secondary to neutropenia.    7. Herpes zoster involving a right thoracic dermatome July 2015. She completed a course of Valtrex . 8. Nodular bony prominence at the left pelvis on rectal exam 06/05/2015-likely a benign finding 9.  Right ureter stone/hydroureteronephrosis on CT 01/13/2021-referred to urology; status post right ureteroscopy with stone extraction and ureteral stent placement.  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  Right hip fracture following a fall 11/02/2022-status post right hip anterior hemiarthroplasty 12.  Acute localized thrombus right proximal femoral vein 11/16/2022 Eliquis  initiated 13.  Admission 06/24/2023 with volume overload    Disposition:  [name] appears stable. Tolerating oral etoposide  without significant side effects. Able to recover and function with good performance status. CA 125 remains normal, today's level is pending. There is no clinical evidence of disease progression.   We reviewed restaging CT AP which shows improved stable disease. However, she has new right hydronephrosis, no obvious obstructing mass. She is symptomatic with right flank pain and urinary symptoms. We will check UA/culture. She is being referred to urology.   Labs reviewed, adequate to continue oral Etoposide  as prescribed. She will start cycle 4 on 09/20/24 after 7 days off.   Follow up in 5 weeks, or sooner if needed.   Patient seen with Dr. Cloretta.    Orders Placed This Encounter  Procedures   Urine Culture    Standing Status:   Future    Number of Occurrences:   1    Expected Date:   09/13/2024    Expiration Date:   09/13/2025   Urinalysis, Complete w Microscopic    Standing Status:   Future    Number of Occurrences:   1    Expected Date:   09/13/2024    Expiration Date:   09/13/2025   Ambulatory referral to Urology    Referral Priority:   Urgent    Referral Type:    Consultation    Referral Reason:   Specialty Services Required    Requested Specialty:   Urology    Number of Visits Requested:   1      All questions were answered. The patient knows to call the clinic with any problems, questions or concerns. No barriers to learning were detected.  Dicie Edelen K Seriah Brotzman, NP 09/13/2024   This was a shared visit with Casie Sturgeon.  Ms. Providence was interviewed and examined.  We reviewed the restaging CT findings and images with her.  The CA125 remains normal, she has not developed recurrent ascites, and there is no evidence for progressive disease on the CT aside from the new right hydronephrosis.  She has right abdomen and flank pain, likely secondary to hydronephrosis.  We will refer her to urology to consider a diagnostic ureterogram and stent placement.  The plan is to continue etoposide .  Arvella Cloretta, MD

## 2024-09-14 ENCOUNTER — Encounter: Payer: Self-pay | Admitting: *Deleted

## 2024-09-14 ENCOUNTER — Other Ambulatory Visit: Payer: Self-pay

## 2024-09-14 ENCOUNTER — Telehealth: Payer: Self-pay | Admitting: *Deleted

## 2024-09-14 ENCOUNTER — Other Ambulatory Visit (HOSPITAL_COMMUNITY): Payer: Self-pay

## 2024-09-14 ENCOUNTER — Other Ambulatory Visit: Payer: Self-pay | Admitting: Pharmacy Technician

## 2024-09-14 LAB — URINE CULTURE: Culture: <10000 — AB

## 2024-09-14 LAB — CA 125: Cancer Antigen (CA) 125: 10.4 U/mL (ref 0.0–38.1)

## 2024-09-14 NOTE — Telephone Encounter (Signed)
 [name] [name] left VM asking if it would be OK for her to choose another urologist in [place] that her daughter goes to instead of [facility]. Called her back and left VM that Dr. Carolee at [facility] is reviewing her case and should hear from them tomorrow. Changing providers could potentially delay her care since they are already working on her case. Suggested she call back on Monday if she really wants to change sites of referral.

## 2024-09-14 NOTE — Progress Notes (Signed)
 Specialty Pharmacy Refill Coordination Note  [name] [name] is a 73 y.o. female contacted today regarding refills of specialty medication(s) Etoposide  (VEPESID )   Patient requested [name] at [facility] [place] date: 09/18/24   Medication will be filled on: 09/17/24

## 2024-09-14 NOTE — Progress Notes (Signed)
 Faxed urgent referral to [facility] on 09/13/2024. Called office today to follow up. Was told by triage nurse that her information has been forwarded to Dr. Carolee and they are awaiting his response.

## 2024-09-17 ENCOUNTER — Other Ambulatory Visit: Payer: Self-pay

## 2024-09-17 ENCOUNTER — Inpatient Hospital Stay: Admitting: Nurse Practitioner

## 2024-09-17 ENCOUNTER — Inpatient Hospital Stay

## 2024-09-18 ENCOUNTER — Encounter (INDEPENDENT_AMBULATORY_CARE_PROVIDER_SITE_OTHER): Payer: Self-pay

## 2024-09-18 ENCOUNTER — Ambulatory Visit: Payer: Self-pay | Admitting: Nurse Practitioner

## 2024-09-19 DIAGNOSIS — N1330 Unspecified hydronephrosis: Secondary | ICD-10-CM | POA: Diagnosis not present

## 2024-09-19 DIAGNOSIS — R1010 Upper abdominal pain, unspecified: Secondary | ICD-10-CM | POA: Diagnosis not present

## 2024-09-21 ENCOUNTER — Other Ambulatory Visit: Payer: Self-pay | Admitting: Urology

## 2024-09-24 ENCOUNTER — Telehealth: Payer: Self-pay

## 2024-09-24 NOTE — Telephone Encounter (Signed)
 Received confirmation of successful fax transmission of signed pre-op clearance.

## 2024-10-02 ENCOUNTER — Other Ambulatory Visit: Payer: Self-pay

## 2024-10-03 ENCOUNTER — Telehealth: Payer: Self-pay | Admitting: *Deleted

## 2024-10-03 NOTE — Telephone Encounter (Signed)
 [name] [name] left VM yesterday requesting conversation about her appointments.  Called her back this am and left VM RN was returning her call.

## 2024-10-05 ENCOUNTER — Telehealth: Payer: Self-pay | Admitting: Nurse Practitioner

## 2024-10-05 ENCOUNTER — Other Ambulatory Visit: Payer: Self-pay

## 2024-10-08 ENCOUNTER — Other Ambulatory Visit: Payer: Self-pay

## 2024-10-10 ENCOUNTER — Other Ambulatory Visit: Payer: Self-pay | Admitting: Oncology

## 2024-10-10 ENCOUNTER — Other Ambulatory Visit: Payer: Self-pay

## 2024-10-10 ENCOUNTER — Encounter: Payer: Self-pay | Admitting: Oncology

## 2024-10-10 ENCOUNTER — Other Ambulatory Visit (HOSPITAL_COMMUNITY): Payer: Self-pay

## 2024-10-10 DIAGNOSIS — C569 Malignant neoplasm of unspecified ovary: Secondary | ICD-10-CM

## 2024-10-10 MED ORDER — ETOPOSIDE 50 MG PO CAPS
50.0000 mg | ORAL_CAPSULE | Freq: Every day | ORAL | 0 refills | Status: AC
  Filled 2024-10-11: qty 21, 21d supply, fill #0
  Filled 2024-10-15: qty 21, 28d supply, fill #0

## 2024-10-11 ENCOUNTER — Other Ambulatory Visit (HOSPITAL_COMMUNITY): Payer: Self-pay

## 2024-10-11 ENCOUNTER — Other Ambulatory Visit: Payer: Self-pay

## 2024-10-12 NOTE — Patient Instructions (Signed)
 SURGICAL WAITING ROOM VISITATION  Patients having surgery or a procedure may have no more than 2 support people in the waiting area - these visitors may rotate.    Children ages 70 and under will not be able to visit patients in [facility] under most circumstances.   Visitors with respiratory illnesses are discouraged from visiting and should remain at home.  If the patient needs to stay at the hospital during part of their recovery, the visitor guidelines for inpatient rooms apply. Pre-op nurse will coordinate an appropriate time for 1 support person to accompany patient in pre-op.  This support person may not rotate.    Please refer to the [facility] website for the visitor guidelines for Inpatients (after your surgery is over and you are in a regular room).       Your procedure is scheduled on: 10/22/24   Report to [facility] Main Entrance    Report to admitting at 10 AM   Call this number if you have problems the morning of surgery [phone]   Do not eat food or drink liquids:After Midnight. But may have sips of water  to take meds.      Oral Hygiene is also important to reduce your risk of infection.                                    Remember - BRUSH YOUR TEETH THE MORNING OF SURGERY WITH YOUR REGULAR TOOTHPASTE  DENTURES WILL BE REMOVED PRIOR TO SURGERY PLEASE DO NOT APPLY Poly grip OR ADHESIVES!!!     Stop all vitamins and herbal supplements 7 days before surgery.   Take these medicines the morning of surgery with A SIP OF WATER : compazine , ondansetron (zofran ), etoposide (vepesid )             You may not have any metal on your body including hair pins, jewelry, and body piercing             Do not wear make-up, lotions, powders, perfumes/cologne, or deodorant  Do not wear nail polish including gel and S&S, artificial/acrylic nails, or any other type of covering on natural nails including finger and toenails. If you have artificial nails, gel  coating, etc. that needs to be removed by a nail salon please have this removed prior to surgery or surgery may need to be canceled/ delayed if the surgeon/ anesthesia feels like they are unable to be safely monitored.   Do not shave  48 hours prior to surgery.             Do not bring valuables to the hospital. [facility] IS NOT             RESPONSIBLE   FOR VALUABLES.   Contacts, glasses, dentures or bridgework may not be worn into surgery.  DO NOT BRING YOUR HOME MEDICATIONS TO THE HOSPITAL. PHARMACY WILL DISPENSE MEDICATIONS LISTED ON YOUR MEDICATION LIST TO YOU DURING YOUR ADMISSION IN THE HOSPITAL!    Patients discharged on the day of surgery will not be allowed to drive home.  Someone NEEDS to stay with you for the first 24 hours after anesthesia.   Special Instructions: Bring a copy of your healthcare power of attorney and living will documents the day of surgery if you haven't scanned them before.              Please read over the following fact sheets  you were given: IF YOU HAVE QUESTIONS ABOUT YOUR PRE-OP INSTRUCTIONS PLEASE [phone] Verneita   If you received a COVID test during your pre-op visit  it is requested that you wear a mask when out in public, stay away from anyone that may not be feeling well and notify your surgeon if you develop symptoms. If you test positive for Covid or have been in contact with anyone that has tested positive in the last 10 days please notify you surgeon.    [facility] - Preparing for Surgery Before surgery, you can play an important role.  Because skin is not sterile, your skin needs to be as free of germs as possible.  You can reduce the number of germs on your skin by washing with CHG (chlorahexidine gluconate) soap before surgery.  CHG is an antiseptic cleaner which kills germs and bonds with the skin to continue killing germs even after washing. Please DO NOT use if you have an allergy to CHG or antibacterial soaps.  If your skin  becomes reddened/irritated stop using the CHG and inform your nurse when you arrive at Short Stay. Do not shave (including legs and underarms) for at least 48 hours prior to the first CHG shower.  You may shave your face/neck.  Please follow these instructions carefully:  1.  Shower with CHG Soap the night before surgery and the morning of surgery.  2.  If you choose to wash your hair, wash your hair first as usual with your normal  shampoo.  3.  After you shampoo, rinse your hair and body thoroughly to remove the shampoo.                             4.  Use CHG as you would any other liquid soap.  You can apply chg directly to the skin and wash.  Gently with a scrungie or clean washcloth.  5.  Apply the CHG Soap to your body ONLY FROM THE NECK DOWN.   Do   not use on face/ open                           Wound or open sores. Avoid contact with eyes, ears mouth and   genitals (private parts).                       Wash face,  Genitals (private parts) with your normal soap.             6.  Wash thoroughly, paying special attention to the area where your    surgery  will be performed.  7.  Thoroughly rinse your body with warm water  from the neck down.  8.  DO NOT shower/wash with your normal soap after using and rinsing off the CHG Soap.                9.  Pat yourself dry with a clean towel.            10.  Wear clean pajamas.            11.  Place clean sheets on your bed the night of your first shower and do not  sleep with pets. Day of Surgery : Do not apply any CHG, lotions/deodorants the morning of surgery.  Please wear clean clothes to the hospital/surgery center.  FAILURE TO FOLLOW THESE INSTRUCTIONS MAY RESULT  IN THE CANCELLATION OF YOUR SURGERY  PATIENT SIGNATURE_________________________________  NURSE SIGNATURE__________________________________  ________________________________________________________________________

## 2024-10-12 NOTE — Progress Notes (Signed)
 COVID Vaccine received:  [x]  No []  Yes Date of any COVID positive Test in last 90 days: none PCP - Lynwood Crandall NP Cardiologist - n/a  Chest x-ray -  EKG -   Stress Test -  ECHO - 12/20/23 Epic Cardiac Cath -   Bowel Prep - [x]  No  []   Yes ______  Pacemaker / ICD device [x]  No []  Yes   Spinal Cord Stimulator:[x]  No []  Yes       History of Sleep Apnea? [x]  No []  Yes   CPAP used?- [x]  No []  Yes    Does the patient monitor blood sugar?          [x]  No []  Yes  []  N/A  Patient has: [x]  NO Hx DM   []  Pre-DM                 []  DM1  []   DM2 Does patient have a [employer] or Dexacom? [x]  No []  Yes   Fasting Blood Sugar Ranges-  Checks Blood Sugar _____ times a day  GLP1 agonist / usual dose - no GLP1 instructions:  SGLT-2 inhibitors / usual dose - no SGLT-2 instructions:   Blood Thinner / Instructions:Eliquis  Instructed by MD to Stop taking 5 days before Surgery. Last dose to be 10/16/24 PM dose. Aspirin  Instructions:no  Comments:   Activity level: Patient is able  to climb a flight of stairs without difficulty; [x]  No CP  [x]  No SOB,    Patient can perform ADLs without assistance.   Anesthesia review: RA, Hx. DVT, On Eliquis , Hx. Ovarian CA.  Patient denies shortness of breath, fever, cough and chest pain at PAT appointment.  Patient verbalized understanding and agreement to the Pre-Surgical Instructions that were given to them at this PAT appointment. Patient was also educated of the need to review these PAT instructions again prior to his/her surgery.I reviewed the appropriate phone numbers to call if they have any and questions or concerns.

## 2024-10-15 ENCOUNTER — Other Ambulatory Visit: Payer: Self-pay

## 2024-10-15 ENCOUNTER — Encounter (HOSPITAL_COMMUNITY): Payer: Self-pay

## 2024-10-15 ENCOUNTER — Encounter (HOSPITAL_COMMUNITY)
Admission: RE | Admit: 2024-10-15 | Discharge: 2024-10-15 | Disposition: A | Source: Ambulatory Visit | Attending: Urology

## 2024-10-15 VITALS — BP 128/79 | HR 68 | Temp 98.1°F | Resp 16 | Ht 64.0 in | Wt 142.0 lb

## 2024-10-15 DIAGNOSIS — Z01812 Encounter for preprocedural laboratory examination: Secondary | ICD-10-CM | POA: Insufficient documentation

## 2024-10-15 DIAGNOSIS — Z01818 Encounter for other preprocedural examination: Secondary | ICD-10-CM

## 2024-10-15 HISTORY — DX: Personal history of other diseases of the digestive system: Z87.19

## 2024-10-15 HISTORY — DX: Personal history of urinary calculi: Z87.442

## 2024-10-15 HISTORY — DX: Myoneural disorder, unspecified: G70.9

## 2024-10-15 LAB — CBC
HCT: 38.6 % (ref 36.0–46.0)
Hemoglobin: 12.6 g/dL (ref 12.0–15.0)
MCH: 35.3 pg — ABNORMAL HIGH (ref 26.0–34.0)
MCHC: 32.6 g/dL (ref 30.0–36.0)
MCV: 108.1 fL — ABNORMAL HIGH (ref 80.0–100.0)
Platelets: 314 K/uL (ref 150–400)
RBC: 3.57 MIL/uL — ABNORMAL LOW (ref 3.87–5.11)
RDW: 16.2 % — ABNORMAL HIGH (ref 11.5–15.5)
WBC: 5.6 K/uL (ref 4.0–10.5)
nRBC: 0 % (ref 0.0–0.2)

## 2024-10-15 NOTE — Progress Notes (Signed)
 Specialty Pharmacy Ongoing Clinical Assessment Note  [name] [name] is a 74 y.o. female who is being followed by the specialty pharmacy service for RxSp Oncology   Patient's specialty medication(s) reviewed today: Etoposide  (VEPESID )   Missed doses in the last 4 weeks: 0   Patient/Caregiver did not have any additional questions or concerns.   Therapeutic benefit summary: Patient is achieving benefit   Adverse events/side effects summary: Experienced adverse events/side effects (has only noticed minor hair loss)   Patient's therapy is appropriate to: Continue    Goals Addressed             This Visit's Progress    Slow Disease Progression   On track    Patient is on track. Patient will maintain adherence. Per visit on 12/11, CA 125 remains normal and there is no clinical evidence of disease progression.          Follow up: 3 months  [facility]

## 2024-10-15 NOTE — Progress Notes (Signed)
 Specialty Pharmacy Refill Coordination Note  [name] [name] is a 74 y.o. female contacted today regarding refills of specialty medication(s) Etoposide  (VEPESID )   Patient requested [name] at [facility] at [place] date: 10/17/24   Medication will be filled on: 10/16/24

## 2024-10-16 ENCOUNTER — Inpatient Hospital Stay: Attending: Oncology

## 2024-10-16 ENCOUNTER — Other Ambulatory Visit: Payer: Self-pay

## 2024-10-16 ENCOUNTER — Inpatient Hospital Stay: Admitting: Nurse Practitioner

## 2024-10-16 ENCOUNTER — Encounter (INDEPENDENT_AMBULATORY_CARE_PROVIDER_SITE_OTHER): Payer: Self-pay | Admitting: Physician Assistant

## 2024-10-16 ENCOUNTER — Ambulatory Visit (INDEPENDENT_AMBULATORY_CARE_PROVIDER_SITE_OTHER): Admitting: Physician Assistant

## 2024-10-16 ENCOUNTER — Encounter: Payer: Self-pay | Admitting: Nurse Practitioner

## 2024-10-16 ENCOUNTER — Inpatient Hospital Stay

## 2024-10-16 VITALS — BP 111/72 | HR 87 | Temp 97.9°F | Ht 64.0 in | Wt 140.0 lb

## 2024-10-16 VITALS — BP 133/89 | HR 78 | Temp 97.8°F | Resp 18 | Ht 64.0 in | Wt 146.0 lb

## 2024-10-16 DIAGNOSIS — H6123 Impacted cerumen, bilateral: Secondary | ICD-10-CM

## 2024-10-16 DIAGNOSIS — C569 Malignant neoplasm of unspecified ovary: Secondary | ICD-10-CM

## 2024-10-16 DIAGNOSIS — H9313 Tinnitus, bilateral: Secondary | ICD-10-CM

## 2024-10-16 DIAGNOSIS — H9193 Unspecified hearing loss, bilateral: Secondary | ICD-10-CM

## 2024-10-16 LAB — CBC WITH DIFFERENTIAL (CANCER CENTER ONLY)
Abs Immature Granulocytes: 0.05 K/uL (ref 0.00–0.07)
Basophils Absolute: 0.1 K/uL (ref 0.0–0.1)
Basophils Relative: 1 %
Eosinophils Absolute: 0.2 K/uL (ref 0.0–0.5)
Eosinophils Relative: 3 %
HCT: 35.9 % — ABNORMAL LOW (ref 36.0–46.0)
Hemoglobin: 11.7 g/dL — ABNORMAL LOW (ref 12.0–15.0)
Immature Granulocytes: 1 %
Lymphocytes Relative: 15 %
Lymphs Abs: 0.9 K/uL (ref 0.7–4.0)
MCH: 34.5 pg — ABNORMAL HIGH (ref 26.0–34.0)
MCHC: 32.6 g/dL (ref 30.0–36.0)
MCV: 105.9 fL — ABNORMAL HIGH (ref 80.0–100.0)
Monocytes Absolute: 0.5 K/uL (ref 0.1–1.0)
Monocytes Relative: 8 %
Neutro Abs: 4.2 K/uL (ref 1.7–7.7)
Neutrophils Relative %: 72 %
Platelet Count: 302 K/uL (ref 150–400)
RBC: 3.39 MIL/uL — ABNORMAL LOW (ref 3.87–5.11)
RDW: 16.2 % — ABNORMAL HIGH (ref 11.5–15.5)
WBC Count: 5.9 K/uL (ref 4.0–10.5)
nRBC: 0 % (ref 0.0–0.2)

## 2024-10-16 LAB — CMP (CANCER CENTER ONLY)
ALT: 16 U/L (ref 0–44)
AST: 22 U/L (ref 15–41)
Albumin: 4.2 g/dL (ref 3.5–5.0)
Alkaline Phosphatase: 73 U/L (ref 38–126)
Anion gap: 10 (ref 5–15)
BUN: 19 mg/dL (ref 8–23)
CO2: 27 mmol/L (ref 22–32)
Calcium: 10.1 mg/dL (ref 8.9–10.3)
Chloride: 102 mmol/L (ref 98–111)
Creatinine: 0.6 mg/dL (ref 0.44–1.00)
GFR, Estimated: >60 mL/min
Glucose, Bld: 75 mg/dL (ref 70–99)
Potassium: 4.1 mmol/L (ref 3.5–5.1)
Sodium: 140 mmol/L (ref 135–145)
Total Bilirubin: 0.3 mg/dL (ref 0.0–1.2)
Total Protein: 7.2 g/dL (ref 6.5–8.1)

## 2024-10-16 NOTE — Patient Instructions (Signed)

## 2024-10-16 NOTE — Progress Notes (Signed)
 " [facility] OFFICE PROGRESS NOTE   Diagnosis: Ovarian cancer  INTERVAL HISTORY:   [name] [name] returns as scheduled.  She completed cycle 4 oral etoposide  beginning 09/21/2024.  She denies nausea/vomiting.  No mouth sores.  No diarrhea.  No rash.  Unchanged intermittent pain at the right low abdomen.  She denies bleeding.  Left ear/throat pain for about 1 week.  She has an appointment with ENT later today.  Objective:  Vital signs in last 24 hours:  Blood pressure 133/89, pulse 78, temperature 97.8 F (36.6 C), temperature source Temporal, resp. rate 18, height 5' 4 (1.626 m), weight 146 lb (66.2 kg), SpO2 98%.    HEENT: No thrush or ulcers. Resp: Lungs clear bilaterally. Cardio: Regular rate and rhythm. GI: No hepatosplenomegaly.  No mass. Vascular: No leg edema. Skin: No rash. Port-A-Cath without erythema.  Lab Results:  Lab Results  Component Value Date   WBC 5.9 10/16/2024   HGB 11.7 (L) 10/16/2024   HCT 35.9 (L) 10/16/2024   MCV 105.9 (H) 10/16/2024   PLT 302 10/16/2024   NEUTROABS 4.2 10/16/2024    Imaging:  No results found.  Medications: I have reviewed the patient's current medications.  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 A 5 mm nodules remain on the right diaphragm. - TumorNext paired germline/tumor analyses: No somatic variants detected, germline CHEK2      VUS      Cycle 1 of adjuvant Taxol /carboplatin  chemotherapy initiated on 09/19/2012.   The CA 125 normalized.   She completed day 15 of cycle 6 on 02/06/2013.   Restaging CT evaluation 03/29/2013 showed no evidence of metastatic disease in the chest. There was marked improvement in appearance/resolution of previous described peritoneal/omental disease. There was no convincing evidence of residual disease. There was  minimal increased density in the region of the omentum favored to be treatment-related. There was no pelvic adenopathy. CA125 3.2 on 02/19/2014. 08/18/2016 CA-125 8 CT abdomen/pelvis 08/25/2016-solitary new enlarged right external iliac lymph node measuring 2.2 cm. Biopsy 09/07/2016-adenocarcinoma consistent with high-grade serous carcinoma.  ER +90%, PR -0%, HER2 negative (0); foundation 1-HRD not detected, microsatellite status cannot be determined, tumor mutation burden 8, FOLR1 positive-80%.   PET scan 09/22/2016-malignant range FDG uptake associated with the enlarged right external iliac lymph node compatible with metastatic adenopathy.  No additional sites of metastatic disease identified. Radiation 10/25/2016-12/01/2016 PET scan 03/28/2017-previously enlarged hypermetabolic right external iliac node-normal in size with resolution of hypermetabolic activity, no active malignancy identified PET scan 05/15/2018-no evidence of recurrent or metastatic disease, no hypermetabolic lymph nodes CT abdomen/pelvis 09/19/2019-mild increase in the size of several left upper quadrant peritoneal nodules measuring up to 11 mm.  No other sites of metastatic disease identified.  No ascites. CT abdomen/pelvis 03/14/2020-slight enlargement of several left upper quadrant peritoneal nodules, no ascites, no other evidence of disease progression CT abdomen/pelvis 09/12/2020 peritoneal nodularity/omental caking predominantly in the left upper/mid abdomen, mildly progressive.  Largest implant in the left upper abdomen adjacent to the stomach now measures 17 mm, previously 13 mm.  Overall volume of peritoneal disease has mildly progressed. CT abdomen/pelvis 01/13/2021- 4 mm right ureteral calculus with moderate right hydronephrosis and upper right hydroureter, progressive omental nodularity, stable calcified and partially calcified right pelvic nodules CT abdomen/pelvis 05/13/2021-enlargement of left upper quadrant omental nodules  and a peritoneal nodule at the right iliac fossa, no ascites CT abdomen/pelvis 08/14/2021-mild  increase in size of omental nodules, no ascites, no new site of metastatic disease Taxol /carboplatin  09/09/2021, 09/15/2021, 09/22/2021 Taxol /carboplatin  10/07/2021, 10/13/2021, 10/20/2021 Taxol /Carboplatin  11/03/2021, 11/10/2021, 11/17/2021 CT 11/23/2021-decrease in peritoneal metastases, no new or progressive disease, airspace opacity at both lung bases Taxol /carboplatin  12/01/2021, 12/08/2021, 12/15/2021 Taxol /Carboplatin  every 2 weeks beginning 12/29/2021 Carboplatin  alone 01/26/2022, Taxol  held due to neuropathy Carboplatin  alone 02/09/2022, Taxol  held due to neuropathy Carboplatin  alone 02/23/2022, Taxol  held due to neuropathy Carboplatin  alone 03/09/2022, Taxol  held due to neuropathy CTs 03/25/2022-no significant change in omental, mesenteric disease.  No new or clearly progressive findings.  Previously noted patchy areas of consolidative opacity at the lung bases have resolved. Treatment break beginning 03/26/2022 CT/pelvis 08/10/2022-increased size of peritoneal nodules, increased small volume ascites, mild right lower lobe pleural nodularity Cycle 1 salvage carboplatin  09/14/2022 (weekly x 3 followed by 1 week break) Cycle 2 salvage carboplatin  10/12/2022 (weekly x 3 followed by 1 week break) Cycle 3 salvage carboplatin  11/16/2022 (weekly x 3 followed by 1 week break) CT abdomen/pelvis 12/08/2022-mild increase in peritoneal carcinomatosis Tamoxifen  20 mg daily 01/07/2023 CT abdomen/pelvis 03/29/2023-increased ascites with persistent diffuse peritoneal nodularity, largest omental implants have decreased in size.  No enlarging implants.,  No evidence of bowel obstruction Paracentesis for 4.5 L 06/29/2023 CTs 08/02/2023-peritoneal carcinomatosis similar to slightly improved.  Stable volume of ascites.  No evidence of metastatic disease in the chest. Tamoxifen  20 mg daily continued 08/02/2023 Paracentesis 08/25/2023-4 L of  fluid removed, cytology shows adenocarcinoma Plan for Doxil  Baseline 2D echo 09/23/2023-LVEF 55 to 60%.  Left ventricle has normal function.  Abnormal strain with apical sparing, follow-up TTE/MRI suggested in 3 months. Cycle 1 Doxil  10/06/2023 Cycle 2 Doxil  11/03/2023 CT abdomen/pelvis 11/16/2023: Compared to 08/02/2023 there is overall stable disease, new 0.9 cm nodule at the left descending colon, I reviewed the CT images.  Multiple lesions appear unchanged.  Stable ascites and peritoneal thickening Cycle 3 Doxil  12/01/2023 Echocardiogram 12/20/2023: LVEF 55-60% Cycle 4 Doxil  12/27/2023 Cycle 5 Doxil  01/24/2024 CTs abdomen/pelvis 02/14/2024-interval increase in the amount of ascites and peritoneal carcinomatosis.  No acute inflammatory process. Cycle 1 mirvetuximab 03/02/2024 Cycle 2 mirvetuximab 03/26/2024 Cycle 3 mirvetuximab 04/16/2024 CT 04/28/2024-diminished volume of extensively loculated fluid throughout the abdomen.  Some peritoneal nodules in the left upper quadrant diminished in size, other nodules unchanged.  Overall findings consistent with slightly improved peritoneal metastatic disease.  No new disease in the abdomen or pelvis. Cycle 4 mirvetuximab 05/14/2024 Cycle 5 mirvetuximab held on 06/06/2024 secondary to ocular toxicity Cycle 1 oral etoposide  06/26/2024 Cycle 2 oral etoposide  07/24/2024 Cycle 3 oral Etoposide  08/21/2024 CT AP 09/10/2024: Stable peritoneal nodules without new peritoneal nodularity; no interval evidence of metastatic progression in the peritoneum. Interval decrease in peritoneal free fluid. New right hydronephrosis and hydroureter with a caliber change in the mid ureter, concerning for ureteral obstruction by metastatic disease.  Cycle 4 oral etoposide  09/21/2024 Scheduled for right ureter stent placement 10/22/2024 Cycle 5 oral Etoposide  10/23/2024   2. Low abdomen/suprapubic pain prior to the exploratory laparotomy-likely secondary to omental/pelvic tumor; persistent  mild pain in the lower abdomen   3. Chronic neck and back pain.   4. Anxiety -persistent despite Lexapro  and Xanax . She has been evaluated by psychiatry. Currently on Lexapro . 5. Status post Port-A-Cath placement 09/22/2012. The Port-A-Cath was removed on 04/03/2013.   6. Neutropenia secondary to chemotherapy- day 15 cycle 1 and cycle 3. Taxol /carboplatin  not given secondary to neutropenia.    7. Herpes zoster involving a right thoracic dermatome July 2015. She  completed a course of Valtrex . 8. Nodular bony prominence at the left pelvis on rectal exam 06/05/2015-likely a benign finding 9.  Right ureter stone/hydroureteronephrosis on CT 01/13/2021-referred to urology; status post right ureteroscopy with stone extraction and ureteral stent placement.  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  Right hip fracture following a fall 11/02/2022-status post right hip anterior hemiarthroplasty 12.  Acute localized thrombus right proximal femoral vein 11/16/2022 Eliquis  initiated 13.  Admission 06/24/2023 with volume overload  Disposition: [name] [name] appears stable.  She has completed 4 cycles of oral Etoposide .  She continues to tolerate treatment well.  There is no clinical evidence of disease progression.  She is scheduled for right ureter stent placement 10/22/2024.  She will begin cycle 5 oral Etoposide  beginning 10/23/2024.  CBC and chemistry panel reviewed.  Labs adequate to proceed as above.  CA125 added today.  She will return for lab and an office visit in 4 weeks.  We are available to see her sooner if needed.    Olam Ned ANP/GNP-BC   10/16/2024  10:05 AM        "

## 2024-10-16 NOTE — Progress Notes (Addendum)
 Dear Dr. Wendee, Here is my assessment for our mutual patient, [name] [name]. Thank you for allowing me the opportunity to care for your patient. Please do not hesitate to contact me should you have any other questions. Sincerely, Chyrl Cohen PA-C  Otolaryngology Clinic Note Referring provider: Dr. Wendee HPI:  [name] [name] is a 74 y.o. female kindly referred by Dr. Wendee   Discussed the use of AI scribe software for clinical note transcription with the patient, who gave verbal consent to proceed.  History of Present Illness    [name] [name] is a 74 year old female with chronic cerumen impaction who presents with bilateral ear fullness and decreased hearing.  She has a longstanding history of recurrent cerumen impaction requiring manual removal by her primary care provider every one to two years, as cerumen does not clear spontaneously and necessitates intervention for symptom relief.  Currently, she reports increased symptoms in the left ear, including a sensation of blockage and decreased hearing. She also experiences bilateral tinnitus, more pronounced on the right. Ear cleaning procedures are described as uncomfortable, and she notes that cerumen does not flake out and requires professional removal.  For the past week, she has had discomfort localized to the left side of her throat. She denies upper respiratory symptoms and has no systemic complaints. She is uncertain if the throat pain is related to her ear symptoms.           Independent Review of Additional Tests or Records:  none   PMH/Meds/All/SocHx/FamHx/ROS:   Past Medical History:  Diagnosis Date   Anxiety and depression     Ascites    Chest pain    a. 09/2004 MV: No isch/infarct.   DJD (degenerative joint disease)    Elevated cholesterol    Endometrial polyp    Family history of bladder cancer    Family history of lymphoma    History of hiatal hernia    History of kidney stones    History of radiation therapy  10/25/16-12/01/16   right pelvis/60.2 Gy in 28 fractions   IRRITABLE BOWEL SYNDROME, HX OF 05/15/2008   Kidney stone    Lower extremity edema    MVP (mitral valve prolapse)    occ palpitations    Neuromuscular disorder (HCC)    Ovarian ca (HCC) 07/2012       Rheumatoid arthritis(714.0) ~ 2010   dr lenon   Right leg DVT (HCC)    a. 11/2022 following R hip fx/surgery-->eliquis .   Shingles      Past Surgical History:  Procedure Laterality Date   ABDOMINAL HYSTERECTOMY  08/22/2012   Procedure: HYSTERECTOMY ABDOMINAL;  Surgeon: Toribio LITTIE Percy, MD;  Location: [facility];  Service: Gynecology;  Laterality: N/A;   ANTERIOR APPROACH HEMI HIP ARTHROPLASTY Right 11/03/2022   Procedure: ANTERIOR APPROACH HEMI HIP ARTHROPLASTY;  Surgeon: Leora Lynwood SAUNDERS, MD;  Location: [facility];  Service: Orthopedics;  Laterality: Right;   BACK SURGERY  10/04/2006   Dr Malcolm   COLONOSCOPY  04/20/2004   COLONOSCOPY W/ BIOPSIES  07/04/2014   COLOSTOMY REVISION  08/22/2012   Procedure: COLON RESECTION SIGMOID;  Surgeon: Toribio LITTIE Percy, MD;  Location: [facility];  Service: Gynecology;;  Rectal Sigmoid resection and low rectal anastomosis   CYST ON NECK  10/04/2009   DEBULKING  08/22/2012   Procedure: DEBULKING;  Surgeon: Toribio LITTIE Percy, MD;  Location: [facility];  Service: Gynecology;  Laterality: N/A;  Radical tumor debulking, Bilateral Ureterolysis   DILATION AND CURETTAGE OF  UTERUS  10/04/2002   WITH HYSTEROSCOPY   HAND SURGERY  10/04/1994   HYSTEROSCOPY  10/04/2002   D&C   IR IMAGING GUIDED PORT INSERTION  09/01/2021   IR PARACENTESIS  09/27/2023   KIDNEY STONE SURGERY Right    with stent placement  Mar 03 2021   NECK SURGERY  10/05/2007   SPURS   OMENTECTOMY  08/22/2012   Procedure: OMENTECTOMY;  Surgeon: Toribio LITTIE Percy, MD;  Location: [facility];  Service: Gynecology;  Laterality: N/A;   SALPINGOOPHORECTOMY  08/22/2012   Procedure: SALPINGO OOPHORECTOMY;  Surgeon: Toribio LITTIE Percy, MD;  Location: [facility];  Service: Gynecology;  Laterality: Bilateral;   TUBAL LIGATION  10/04/1978    Family History  Problem Relation Age of Onset   Heart disease Mother    Bladder Cancer Father 39   Osteoporosis Sister    Lymphoma Paternal Aunt    Colon cancer Neg Hx    Rectal cancer Neg Hx    Stomach cancer Neg Hx    Diabetes Neg Hx    Stroke Neg Hx    Colon polyps Neg Hx    Breast cancer Neg Hx      Social Connections: Moderately Isolated (06/21/2024)   Social Connection and Isolation Panel    Frequency of Communication with Friends and Family: More than three times a week    Frequency of Social Gatherings with Friends and Family: More than three times a week    Attends Religious Services: More than 4 times per year    Active Member of [employer] or Organizations: No    Attends Banker Meetings: Never    Marital Status: Separated     Current Medications[1]   Physical Exam:   BP 111/72   Pulse 87   Temp 97.9 F (36.6 C)   Ht 5' 4 (1.626 m)   Wt 140 lb (63.5 kg)   SpO2 94%   BMI 24.03 kg/m   Pertinent Findings  CN II-XII grossly intact Bilateral cerumen impaction Anterior rhinoscopy: Septum midline; bilateral inferior turbinates with no hypertrophy  No lesions of oral cavity/oropharynx; dentition wnl No obviously palpable neck masses/lymphadenopathy/thyromegaly No respiratory distress or stridor   Seprately Identifiable Procedures:  Procedure: Bilateral ear microscopy and cerumen removal using microscope (CPT 69210) - Mod 50 Pre-procedure diagnosis: bilateral cerumen impaction external auditory canals Post-procedure diagnosis: same Indication: bilateral cerumen impaction; given patient's otologic complaints and history as well as for improved and comprehensive examination of external ear and tympanic membrane, bilateral otologic examination using microscope was performed and impacted cerumen removed  Procedure: Patient was placed  semi-recumbent. Both ear canals were examined using the microscope with findings above. Cerumen removed from bilateral external auditory canals using suction and currette with improvement in EAC examination and patency. Left: EAC was patent. TM was intact . Middle ear was aerated. Drainage: none Right: EAC was patent. TM was intact . Middle ear was aerated . Drainage: none Patient tolerated the procedure well.   Impression & Plans:  [name] [name] is a 74 y.o. female with the following   Assessment and Plan    Bilateral cerumen impaction  - Ordered audiometry to evaluate hearing loss and tinnitus. - Advised to report persistent throat discomfort if symptoms persist for several months.       - f/u phone call with audio results    Thank you for allowing me the opportunity to care for your patient. Please do not hesitate to contact me should you have any other  questions.  Sincerely, Chyrl Cohen PA-C [facility] Phone: [phone] Fax: [phone]  10/17/2024, 9:07 AM         [1]  Current Outpatient Medications:    apixaban  (ELIQUIS ) 5 MG TABS tablet, Take 5 mg by mouth 2 (two) times daily., Disp: , Rfl:    Artificial Tears ophthalmic solution, Place 1-2 drops into both eyes 4 (four) times daily. Wait at least 10 minutes after steroid drop use before administration, Disp: , Rfl:    dexamethasone  (DECADRON ) 0.1 % ophthalmic solution, Place 1 drop into both eyes every 3 (three) hours., Disp: , Rfl:    docusate sodium  (COLACE) 100 MG capsule, Take 100 mg by mouth daily as needed for mild constipation., Disp: , Rfl:    fish oil-omega-3 fatty acids 1000 MG capsule, Take 1 g by mouth daily., Disp: , Rfl:    Ginger, Zingiber officinalis, (GINGER PO), Take 1 Dose by mouth daily at 6 (six) AM., Disp: , Rfl:    ondansetron  (ZOFRAN ) 8 MG tablet, Take 1 tablet (8 mg total) by mouth every 8 (eight) hours as needed for nausea or vomiting., Disp: 30 tablet, Rfl: 2   Probiotic  Product (FORTIFY PROBIOTIC WOMENS PO), Take 1 tablet by mouth 1 day or 1 dose., Disp: , Rfl:    prochlorperazine  (COMPAZINE ) 10 MG tablet, Take 1 tablet (10 mg total) by mouth every 6 (six) hours as needed for nausea or vomiting., Disp: 30 tablet, Rfl: 2   Turmeric (QC TUMERIC COMPLEX PO), Take 1 Dose by mouth daily., Disp: , Rfl:    Vitamin D , Ergocalciferol , (DRISDOL ) 1.25 MG (50000 UNIT) CAPS capsule, Take 1 capsule (50,000 Units total) by mouth every 7 (seven) days., Disp: 12 capsule, Rfl: 0   etoposide  (VEPESID ) 50 MG capsule, Take 1 capsule (50 mg total) by mouth daily. Take for 21 days, then hold for 7 days. Repeat every 28 days. Start cycle on 10/18/2024, Disp: 21 capsule, Rfl: 0 No current facility-administered medications for this visit.  Facility-Administered Medications Ordered in Other Visits:    heparin  lock flush 100 unit/mL, 500 Units, Intracatheter, Once PRN, Debby Olam POUR, NP   sodium chloride  flush (NS) 0.9 % injection 10 mL, 10 mL, Intracatheter, PRN, Debby Olam POUR, NP

## 2024-10-17 LAB — CA 125: Cancer Antigen (CA) 125: 16.7 U/mL (ref 0.0–38.1)

## 2024-10-18 ENCOUNTER — Ambulatory Visit: Admitting: Urology

## 2024-10-22 ENCOUNTER — Other Ambulatory Visit (HOSPITAL_COMMUNITY): Payer: Self-pay

## 2024-10-22 ENCOUNTER — Ambulatory Visit (HOSPITAL_COMMUNITY)
Admission: RE | Admit: 2024-10-22 | Discharge: 2024-10-22 | Disposition: A | Discharge status: Home / Self Care | Attending: Urology | Admitting: Urology

## 2024-10-22 ENCOUNTER — Ambulatory Visit (HOSPITAL_COMMUNITY): Payer: Self-pay | Admitting: Certified Registered"

## 2024-10-22 ENCOUNTER — Ambulatory Visit (HOSPITAL_COMMUNITY)

## 2024-10-22 ENCOUNTER — Encounter (HOSPITAL_COMMUNITY)
Admission: RE | Disposition: A | Discharge status: Home / Self Care | Payer: Self-pay | Source: Home / Self Care | Attending: Urology

## 2024-10-22 DIAGNOSIS — N1330 Unspecified hydronephrosis: Secondary | ICD-10-CM | POA: Diagnosis present

## 2024-10-22 DIAGNOSIS — N131 Hydronephrosis with ureteral stricture, not elsewhere classified: Secondary | ICD-10-CM

## 2024-10-22 DIAGNOSIS — C786 Secondary malignant neoplasm of retroperitoneum and peritoneum: Secondary | ICD-10-CM | POA: Insufficient documentation

## 2024-10-22 DIAGNOSIS — Z87442 Personal history of urinary calculi: Secondary | ICD-10-CM | POA: Diagnosis not present

## 2024-10-22 DIAGNOSIS — Z8543 Personal history of malignant neoplasm of ovary: Secondary | ICD-10-CM | POA: Diagnosis not present

## 2024-10-22 HISTORY — PX: CYSTOSCOPY WITH RETROGRADE PYELOGRAM, URETEROSCOPY AND STENT PLACEMENT: SHX5789

## 2024-10-22 HISTORY — PX: CYSTOSCOPY WITH STENT PLACEMENT: SHX5790

## 2024-10-22 HISTORY — PX: CYSTOSCOPY WITH URETHRAL DILATATION: SHX5125

## 2024-10-22 HISTORY — PX: CYSTOSCOPY WITH URETEROSCOPY, STONE BASKETRY AND STENT PLACEMENT: SHX6378

## 2024-10-22 MED ORDER — SODIUM CHLORIDE 0.9 % IR SOLN
Status: DC | PRN
  Administered 2024-10-22: 3000 mL via INTRAVESICAL

## 2024-10-22 MED ORDER — CHLORHEXIDINE GLUCONATE 0.12 % MT SOLN
15.0000 mL | Freq: Once | OROMUCOSAL | Status: AC
  Administered 2024-10-22: 15 mL via OROMUCOSAL

## 2024-10-22 MED ORDER — MIDAZOLAM HCL 2 MG/2ML IJ SOLN
INTRAMUSCULAR | Status: AC
  Filled 2024-10-22: qty 2

## 2024-10-22 MED ORDER — DEXAMETHASONE SOD PHOSPHATE PF 10 MG/ML IJ SOLN
INTRAMUSCULAR | Status: DC | PRN
  Administered 2024-10-22: 5 mg via INTRAVENOUS

## 2024-10-22 MED ORDER — DEXAMETHASONE SOD PHOSPHATE PF 10 MG/ML IJ SOLN
INTRAMUSCULAR | Status: AC
  Filled 2024-10-22: qty 1

## 2024-10-22 MED ORDER — FENTANYL CITRATE (PF) 100 MCG/2ML IJ SOLN
INTRAMUSCULAR | Status: DC | PRN
  Administered 2024-10-22: 50 ug via INTRAVENOUS

## 2024-10-22 MED ORDER — HYDROCODONE-ACETAMINOPHEN 5-325 MG PO TABS: 1.0000 | ORAL_TABLET | Freq: Four times a day (QID) | ORAL | 0 refills | Status: AC | PRN

## 2024-10-22 MED ORDER — MIDAZOLAM HCL (PF) 2 MG/2ML IJ SOLN
INTRAMUSCULAR | Status: DC | PRN
  Administered 2024-10-22: 1 mg via INTRAVENOUS

## 2024-10-22 MED ORDER — EPHEDRINE SULFATE-NACL 50-0.9 MG/10ML-% IV SOSY
PREFILLED_SYRINGE | INTRAVENOUS | Status: DC | PRN
  Administered 2024-10-22: 5 mg via INTRAVENOUS
  Administered 2024-10-22: 10 mg via INTRAVENOUS
  Administered 2024-10-22: 5 mg via INTRAVENOUS

## 2024-10-22 MED ORDER — IOHEXOL 300 MG/ML  SOLN
INTRAMUSCULAR | Status: DC | PRN
  Administered 2024-10-22: 10 mL via URETHRAL

## 2024-10-22 MED ORDER — ONDANSETRON HCL 4 MG/2ML IJ SOLN
INTRAMUSCULAR | Status: AC
  Filled 2024-10-22: qty 2

## 2024-10-22 MED ORDER — PROPOFOL 10 MG/ML IV BOLUS
INTRAVENOUS | Status: DC | PRN
  Administered 2024-10-22: 110 mg via INTRAVENOUS

## 2024-10-22 MED ORDER — ORAL CARE MOUTH RINSE: 15.0000 mL | Freq: Once | OROMUCOSAL | Status: AC

## 2024-10-22 MED ORDER — CEFAZOLIN SODIUM-DEXTROSE 2-4 GM/100ML-% IV SOLN
2.0000 g | INTRAVENOUS | Status: AC
  Administered 2024-10-22: 2 g via INTRAVENOUS
  Filled 2024-10-22: qty 100

## 2024-10-22 MED ORDER — FENTANYL CITRATE (PF) 100 MCG/2ML IJ SOLN
INTRAMUSCULAR | Status: AC
  Filled 2024-10-22: qty 2

## 2024-10-22 MED ORDER — PROPOFOL 10 MG/ML IV BOLUS
INTRAVENOUS | Status: AC
  Filled 2024-10-22: qty 20

## 2024-10-22 MED ORDER — ONDANSETRON HCL 4 MG/2ML IJ SOLN
INTRAMUSCULAR | Status: DC | PRN
  Administered 2024-10-22: 4 mg via INTRAVENOUS

## 2024-10-22 MED ORDER — LACTATED RINGERS IV SOLN: INTRAVENOUS | Status: DC

## 2024-10-22 MED ORDER — LIDOCAINE HCL (CARDIAC) PF 100 MG/5ML IV SOSY
PREFILLED_SYRINGE | INTRAVENOUS | Status: DC | PRN
  Administered 2024-10-22: 50 mg via INTRAVENOUS

## 2024-10-22 NOTE — Interval H&P Note (Signed)
 History and Physical Interval Note:  10/22/2024 10:10 AM  [name] [name]  has presented today for surgery, with the diagnosis of HYDRONEPHROSIS, UNSPECIFIED.  The various methods of treatment have been discussed with the patient and family. After consideration of risks, benefits and other options for treatment, the patient has consented to  Procedures: CYSTOURETEROSCOPY, WITH RETROGRADE PYELOGRAM AND STENT INSERTION (Right) CYSTOSCOPY, WITH STENT INSERTION (Right) CYSTOSCOPY, WITH CALCULUS MANIPULATION OR REMOVAL (Right) CYSTOSCOPY, WITH URETHRAL DILATION (Right) as a surgical intervention.  The patient's history has been reviewed, patient examined, no change in status, stable for surgery.  I have reviewed the patient's chart and labs.  Questions were answered to the patient's satisfaction.     Sherwood JONETTA Edison, III

## 2024-10-22 NOTE — Discharge Instructions (Signed)

## 2024-10-22 NOTE — Anesthesia Procedure Notes (Signed)
 Procedure Name: LMA Insertion Date/Time: 10/22/2024 10:36 AM  Performed by: Metta Andrea NOVAK, CRNAPre-anesthesia Checklist: Patient identified, Emergency Drugs available, Suction available, Patient being monitored and Timeout performed Patient Re-evaluated:Patient Re-evaluated prior to induction Oxygen Delivery Method: Circle system utilized Preoxygenation: Pre-oxygenation with 100% oxygen Induction Type: IV induction Ventilation: Mask ventilation without difficulty LMA: LMA inserted LMA Size: 4.0 Number of attempts: 1 Placement Confirmation: positive ETCO2 Tube secured with: Tape Dental Injury: Teeth and Oropharynx as per pre-operative assessment

## 2024-10-22 NOTE — Anesthesia Preprocedure Evaluation (Signed)
"                                    Anesthesia Evaluation  Patient identified by MRN, date of birth, ID band Patient awake    Reviewed: Allergy & Precautions, NPO status , Patient's Chart, lab work & pertinent test results  History of Anesthesia Complications Negative for: history of anesthetic complications  Airway Mallampati: II  TM Distance: >3 FB Neck ROM: Full    Dental no notable dental hx. (+) Teeth Intact   Pulmonary neg pulmonary ROS, neg sleep apnea, neg COPD, Patient abstained from smoking.Not current smoker   Pulmonary exam normal breath sounds clear to auscultation       Cardiovascular Exercise Tolerance: Good METS(-) hypertension(-) CAD and (-) Past MI negative cardio ROS (-) dysrhythmias  Rhythm:Regular Rate:Normal - Systolic murmurs    Neuro/Psych  PSYCHIATRIC DISORDERS Anxiety Depression    negative neurological ROS     GI/Hepatic hiatal hernia,neg GERD  ,,(+)     (-) substance abuse    Endo/Other  neg diabetes    Renal/GU Renal disease     Musculoskeletal  (+) Arthritis ,    Abdominal   Peds  Hematology   Anesthesia Other Findings Past Medical History:  : Anxiety and depression No date: Ascites No date: Chest pain     Comment:  a. 09/2004 MV: No isch/infarct. No date: DJD (degenerative joint disease) No date: Elevated cholesterol No date: Endometrial polyp No date: Family history of bladder cancer No date: Family history of lymphoma No date: History of hiatal hernia No date: History of kidney stones 10/25/16-12/01/16: History of radiation therapy     Comment:  right pelvis/60.2 Gy in 28 fractions 05/15/2008: IRRITABLE BOWEL SYNDROME, HX OF No date: Kidney stone No date: Lower extremity edema No date: MVP (mitral valve prolapse)     Comment:  occ palpitations  No date: Neuromuscular disorder (HCC) 07/2012: Ovarian ca (HCC)     Comment:    ~ 2010: Rheumatoid arthritis(714.0)     Comment:  dr lenon No date: Right  leg DVT (HCC)     Comment:  a. 11/2022 following R hip fx/surgery-->eliquis . No date: Shingles  Reproductive/Obstetrics                              Anesthesia Physical Anesthesia Plan  ASA: 2  Anesthesia Plan: General   Post-op Pain Management: Ofirmev  IV (intra-op)*   Induction: Intravenous  PONV Risk Score and Plan: 4 or greater and Ondansetron , Dexamethasone  and Treatment may vary due to age or medical condition  Airway Management Planned: LMA  Additional Equipment: None  Intra-op Plan:   Post-operative Plan: Extubation in OR  Informed Consent: I have reviewed the patients History and Physical, chart, labs and discussed the procedure including the risks, benefits and alternatives for the proposed anesthesia with the patient or authorized representative who has indicated his/her understanding and acceptance.     Dental advisory given  Plan Discussed with: CRNA and Surgeon  Anesthesia Plan Comments: (Discussed risks of anesthesia with patient, including PONV, sore throat, lip/dental/eye damage. Rare risks discussed as well, such as cardiorespiratory and neurological sequelae, and allergic reactions. Discussed the role of CRNA in patient's perioperative care. Patient understands.)        Anesthesia Quick Evaluation  "

## 2024-10-22 NOTE — Op Note (Signed)
 Operative Note  Preoperative diagnosis:  1.  Right hydronephrosis secondary to stricture versus mass  Postoperative diagnosis: 1.  Right hydronephrosis secondary to ureteral stricture  Procedure(s): 1.  Cystoscopy with right retrograde pyelogram, right diagnostic ureteroscopy, right ureteral balloon dilation, ureteral stent placement  Surgeon: Sherwood Edison, MD  Assistants: None  Anesthesia: General  Complications: None immediate  EBL: Minimal  Specimens: 1.  None  Drains/Catheters: 1.  6 x 24 double-J ureteral stent, no string  Intraoperative findings: 1.  Normal urethra and bladder mucosa 2.  Right ureteroscopy confirmed a narrow area in the ureter measuring about 2 cm in length.  There was no tumor stone or mass obstructing the lumen.  Ureteroscope was able to transverse the narrow area.  Ureteropelvic junction and proximal ureter was somewhat tortuous but open and able to be transversed with the scope easily. 3.  Right retrograde pyelogram confirmed an approximately 2 cm area of narrowing consistent with stricture with some tortuosity more proximal and severe hydroureteronephrosis down to the level of the stricture. 4.  Ureteroscopy and retrograde pyelogram after treatment of the stricture showed a wide open lumen  Indication: 74 year old female with a history of nephrolithiasis underwent ureteroscopy in the past for proximal ureteral stone.  In the past 3 months has developed hydronephrosis with an area of narrowing and inflammation/edema at the proximal ureter.  She presents for diagnostic ureteroscopy and possible treatment of ureteral stricture versus mass.  Description of procedure:  The patient was identified and consent was obtained.  The patient was taken to the operating room and placed in the supine position.  The patient was placed under general anesthesia.  Perioperative antibiotics were administered.  The patient was placed in dorsal lithotomy.  Patient was prepped  and draped in a standard sterile fashion and a timeout was performed.  A 21 French rigid cystoscope was advanced into the urethra and into the bladder.  Complete cystoscopy was performed with findings noted above.  Right ureter was cannulated with a sensor wire which was advanced up to the kidney under fluoroscopic guidance.  Semirigid ureteroscopy was performed alongside the wire up to the area of narrowing.  I was able to navigate the scope through this area without difficulty and ultimately up to the renal pelvis.  There were no tumors stones or masses identified.  I withdrew the scope to proximal to the narrowed area and shot a retrograde pyelogram through the scope with the findings noted above.  I withdrew the scope and advanced the balloon dilator over the wire and transversed the strictured area fluoroscopically and dilated to a pressure of 18.  This remained inflated for approximately 5 minutes.  I deflated the balloon and withdrew it.  Semirigid ureteroscopy was performed and the lumen was wide open.  Retrograde pyelogram confirmed this as well.  I withdrew the scope and backloaded the wire onto a rigid cystoscope and advanced that into the bladder followed by routine placement of a 6 x 24 double-J ureteral stent.  Fluoroscopy confirmed proximal placement and direct visualization confirmed a good coil in the bladder.  I drained the bladder withdrew the scope.  Patient tolerated the procedure well was stable postoperatively.  Plan: Follow-up in about 2-3 weeks for stent removal

## 2024-10-22 NOTE — Transfer of Care (Signed)
 Immediate Anesthesia Transfer of Care Note  Patient: [name] [name]  Procedure(s) Performed: ERNESTA, WITH RETROGRADE PYELOGRAM AND STENT INSERTION (Right: Bladder) CYSTOSCOPY, WITH STENT INSERTION (Right) CYSTOSCOPY, WITH CALCULUS MANIPULATION OR REMOVAL (Right) CYSTOSCOPY, WITH URETHRAL DILATION (Right)  Patient Location: PACU  Anesthesia Type:General  Level of Consciousness: awake, alert , and patient cooperative  Airway & Oxygen Therapy: Patient Spontanous Breathing  Post-op Assessment: Report given to RN and Post -op Vital signs reviewed and stable  Post vital signs: Reviewed and stable  Last Vitals:  Vitals Value Taken Time  BP 133/73 10/22/24 11:19  Temp    Pulse 79 10/22/24 11:21  Resp 10 10/22/24 11:21  SpO2 96 % 10/22/24 11:21  Vitals shown include unfiled device data.  Last Pain:  Vitals:   10/22/24 0841  TempSrc:   PainSc: 0-No pain         Complications: No notable events documented.

## 2024-10-22 NOTE — Anesthesia Postprocedure Evaluation (Signed)
"   Anesthesia Post Note  Patient: [name] [name]  Procedure(s) Performed: ERNESTA, WITH RETROGRADE PYELOGRAM AND STENT INSERTION (Right: Bladder) CYSTOSCOPY, WITH STENT INSERTION (Right) CYSTOSCOPY, WITH CALCULUS MANIPULATION OR REMOVAL (Right) CYSTOSCOPY, WITH URETHRAL DILATION (Right)     Patient location during evaluation: PACU Anesthesia Type: General Level of consciousness: awake and alert Pain management: pain level controlled Vital Signs Assessment: post-procedure vital signs reviewed and stable Respiratory status: spontaneous breathing, nonlabored ventilation, respiratory function stable and patient connected to nasal cannula oxygen Cardiovascular status: blood pressure returned to baseline and stable Postop Assessment: no apparent nausea or vomiting Anesthetic complications: no   No notable events documented.  Last Vitals:  Vitals:   10/22/24 1200 10/22/24 1215  BP: 133/70 132/70  Pulse: 66   Resp: 10 20  Temp: 36.6 C   SpO2: 96% 96%    Last Pain:  Vitals:   10/22/24 1215  TempSrc:   PainSc: 0-No pain                 Rome Ade      "

## 2024-10-23 ENCOUNTER — Encounter (HOSPITAL_COMMUNITY): Payer: Self-pay | Admitting: Urology

## 2024-11-15 ENCOUNTER — Inpatient Hospital Stay: Admitting: Nurse Practitioner

## 2024-11-15 ENCOUNTER — Inpatient Hospital Stay

## 2024-11-22 ENCOUNTER — Ambulatory Visit (INDEPENDENT_AMBULATORY_CARE_PROVIDER_SITE_OTHER): Admitting: Audiology

## 2024-12-03 ENCOUNTER — Ambulatory Visit (INDEPENDENT_AMBULATORY_CARE_PROVIDER_SITE_OTHER): Admitting: Audiology

## 2025-01-24 ENCOUNTER — Inpatient Hospital Stay: Admitting: Gynecologic Oncology
